# Patient Record
Sex: Male | Born: 1940 | Race: White | Hispanic: No | Marital: Married | Smoking: Former smoker
Health system: Southern US, Community
[De-identification: ages and names within clinical notes are randomized; demographics above are authoritative.]

## PROBLEM LIST (undated history)

## (undated) DIAGNOSIS — K649 Unspecified hemorrhoids: Secondary | ICD-10-CM

## (undated) DIAGNOSIS — I739 Peripheral vascular disease, unspecified: Secondary | ICD-10-CM

## (undated) DIAGNOSIS — I96 Gangrene, not elsewhere classified: Secondary | ICD-10-CM

## (undated) DIAGNOSIS — E119 Type 2 diabetes mellitus without complications: Secondary | ICD-10-CM

## (undated) DIAGNOSIS — T8130XA Disruption of wound, unspecified, initial encounter: Secondary | ICD-10-CM

## (undated) DIAGNOSIS — Z992 Dependence on renal dialysis: Secondary | ICD-10-CM

## (undated) DIAGNOSIS — I639 Cerebral infarction, unspecified: Secondary | ICD-10-CM

## (undated) DIAGNOSIS — K219 Gastro-esophageal reflux disease without esophagitis: Secondary | ICD-10-CM

## (undated) DIAGNOSIS — T8859XA Other complications of anesthesia, initial encounter: Secondary | ICD-10-CM

## (undated) DIAGNOSIS — J449 Chronic obstructive pulmonary disease, unspecified: Secondary | ICD-10-CM

## (undated) DIAGNOSIS — I499 Cardiac arrhythmia, unspecified: Secondary | ICD-10-CM

## (undated) DIAGNOSIS — Z9889 Other specified postprocedural states: Secondary | ICD-10-CM

## (undated) DIAGNOSIS — Z95810 Presence of automatic (implantable) cardiac defibrillator: Secondary | ICD-10-CM

## (undated) DIAGNOSIS — I2510 Atherosclerotic heart disease of native coronary artery without angina pectoris: Secondary | ICD-10-CM

## (undated) DIAGNOSIS — R112 Nausea with vomiting, unspecified: Secondary | ICD-10-CM

## (undated) DIAGNOSIS — R195 Other fecal abnormalities: Secondary | ICD-10-CM

## (undated) DIAGNOSIS — M3500 Sicca syndrome, unspecified: Secondary | ICD-10-CM

## (undated) DIAGNOSIS — G4730 Sleep apnea, unspecified: Secondary | ICD-10-CM

## (undated) DIAGNOSIS — I10 Essential (primary) hypertension: Secondary | ICD-10-CM

## (undated) DIAGNOSIS — I509 Heart failure, unspecified: Secondary | ICD-10-CM

## (undated) DIAGNOSIS — D649 Anemia, unspecified: Secondary | ICD-10-CM

## (undated) DIAGNOSIS — T4145XA Adverse effect of unspecified anesthetic, initial encounter: Secondary | ICD-10-CM

## (undated) DIAGNOSIS — Z8719 Personal history of other diseases of the digestive system: Secondary | ICD-10-CM

## (undated) DIAGNOSIS — N186 End stage renal disease: Secondary | ICD-10-CM

## (undated) DIAGNOSIS — M1990 Unspecified osteoarthritis, unspecified site: Secondary | ICD-10-CM

## (undated) DIAGNOSIS — L089 Local infection of the skin and subcutaneous tissue, unspecified: Secondary | ICD-10-CM

## (undated) DIAGNOSIS — R0600 Dyspnea, unspecified: Secondary | ICD-10-CM

## (undated) DIAGNOSIS — Z89432 Acquired absence of left foot: Secondary | ICD-10-CM

## (undated) HISTORY — DX: Peripheral vascular disease, unspecified: I73.9

## (undated) HISTORY — DX: Local infection of the skin and subcutaneous tissue, unspecified: L08.9

## (undated) HISTORY — PX: COLONOSCOPY W/ BIOPSIES AND POLYPECTOMY: SHX1376

## (undated) HISTORY — PX: SIGMOIDECTOMY: SHX176

## (undated) HISTORY — DX: Acquired absence of left foot: Z89.432

## (undated) HISTORY — DX: Gangrene, not elsewhere classified: I96

---

## 2009-04-11 HISTORY — PX: CORONARY ARTERY BYPASS GRAFT: SHX141

## 2014-04-11 DIAGNOSIS — I639 Cerebral infarction, unspecified: Secondary | ICD-10-CM

## 2014-04-11 HISTORY — DX: Cerebral infarction, unspecified: I63.9

## 2015-01-16 ENCOUNTER — Ambulatory Visit: Payer: Self-pay | Admitting: "Endocrinology

## 2015-04-12 HISTORY — PX: OTHER SURGICAL HISTORY: SHX169

## 2015-09-21 ENCOUNTER — Emergency Department (HOSPITAL_COMMUNITY): Payer: Medicare Other

## 2015-09-21 ENCOUNTER — Inpatient Hospital Stay (HOSPITAL_COMMUNITY)
Admission: EM | Admit: 2015-09-21 | Discharge: 2015-09-26 | DRG: 871 | Disposition: A | Discharge status: Home / Self Care | Payer: Medicare Other | Attending: Internal Medicine | Admitting: Internal Medicine

## 2015-09-21 ENCOUNTER — Encounter (HOSPITAL_COMMUNITY): Payer: Self-pay | Admitting: Emergency Medicine

## 2015-09-21 DIAGNOSIS — E1122 Type 2 diabetes mellitus with diabetic chronic kidney disease: Secondary | ICD-10-CM | POA: Diagnosis present

## 2015-09-21 DIAGNOSIS — R140 Abdominal distension (gaseous): Secondary | ICD-10-CM

## 2015-09-21 DIAGNOSIS — E1100 Type 2 diabetes mellitus with hyperosmolarity without nonketotic hyperglycemic-hyperosmolar coma (NKHHC): Secondary | ICD-10-CM | POA: Diagnosis present

## 2015-09-21 DIAGNOSIS — M109 Gout, unspecified: Secondary | ICD-10-CM | POA: Diagnosis present

## 2015-09-21 DIAGNOSIS — Z992 Dependence on renal dialysis: Secondary | ICD-10-CM | POA: Diagnosis not present

## 2015-09-21 DIAGNOSIS — E871 Hypo-osmolality and hyponatremia: Secondary | ICD-10-CM | POA: Diagnosis not present

## 2015-09-21 DIAGNOSIS — R0602 Shortness of breath: Secondary | ICD-10-CM | POA: Diagnosis present

## 2015-09-21 DIAGNOSIS — M7989 Other specified soft tissue disorders: Secondary | ICD-10-CM

## 2015-09-21 DIAGNOSIS — Z955 Presence of coronary angioplasty implant and graft: Secondary | ICD-10-CM

## 2015-09-21 DIAGNOSIS — Z881 Allergy status to other antibiotic agents status: Secondary | ICD-10-CM

## 2015-09-21 DIAGNOSIS — Z7902 Long term (current) use of antithrombotics/antiplatelets: Secondary | ICD-10-CM | POA: Diagnosis not present

## 2015-09-21 DIAGNOSIS — Z87891 Personal history of nicotine dependence: Secondary | ICD-10-CM | POA: Diagnosis not present

## 2015-09-21 DIAGNOSIS — K567 Ileus, unspecified: Secondary | ICD-10-CM | POA: Diagnosis present

## 2015-09-21 DIAGNOSIS — I214 Non-ST elevation (NSTEMI) myocardial infarction: Secondary | ICD-10-CM | POA: Diagnosis present

## 2015-09-21 DIAGNOSIS — Z8673 Personal history of transient ischemic attack (TIA), and cerebral infarction without residual deficits: Secondary | ICD-10-CM | POA: Diagnosis not present

## 2015-09-21 DIAGNOSIS — R7989 Other specified abnormal findings of blood chemistry: Secondary | ICD-10-CM | POA: Diagnosis present

## 2015-09-21 DIAGNOSIS — N186 End stage renal disease: Secondary | ICD-10-CM

## 2015-09-21 DIAGNOSIS — E0865 Diabetes mellitus due to underlying condition with hyperglycemia: Secondary | ICD-10-CM | POA: Diagnosis not present

## 2015-09-21 DIAGNOSIS — I120 Hypertensive chronic kidney disease with stage 5 chronic kidney disease or end stage renal disease: Secondary | ICD-10-CM | POA: Diagnosis present

## 2015-09-21 DIAGNOSIS — M3500 Sicca syndrome, unspecified: Secondary | ICD-10-CM | POA: Diagnosis present

## 2015-09-21 DIAGNOSIS — Z951 Presence of aortocoronary bypass graft: Secondary | ICD-10-CM | POA: Diagnosis not present

## 2015-09-21 DIAGNOSIS — T8571XA Infection and inflammatory reaction due to peritoneal dialysis catheter, initial encounter: Secondary | ICD-10-CM

## 2015-09-21 DIAGNOSIS — Z7982 Long term (current) use of aspirin: Secondary | ICD-10-CM | POA: Diagnosis not present

## 2015-09-21 DIAGNOSIS — M130 Polyarthritis, unspecified: Secondary | ICD-10-CM | POA: Diagnosis present

## 2015-09-21 DIAGNOSIS — R6510 Systemic inflammatory response syndrome (SIRS) of non-infectious origin without acute organ dysfunction: Principal | ICD-10-CM | POA: Diagnosis present

## 2015-09-21 DIAGNOSIS — N2581 Secondary hyperparathyroidism of renal origin: Secondary | ICD-10-CM | POA: Diagnosis present

## 2015-09-21 DIAGNOSIS — I25700 Atherosclerosis of coronary artery bypass graft(s), unspecified, with unstable angina pectoris: Secondary | ICD-10-CM | POA: Diagnosis not present

## 2015-09-21 DIAGNOSIS — J9601 Acute respiratory failure with hypoxia: Secondary | ICD-10-CM | POA: Diagnosis not present

## 2015-09-21 DIAGNOSIS — J449 Chronic obstructive pulmonary disease, unspecified: Secondary | ICD-10-CM | POA: Diagnosis present

## 2015-09-21 DIAGNOSIS — I639 Cerebral infarction, unspecified: Secondary | ICD-10-CM | POA: Diagnosis present

## 2015-09-21 DIAGNOSIS — E1165 Type 2 diabetes mellitus with hyperglycemia: Secondary | ICD-10-CM

## 2015-09-21 DIAGNOSIS — M1990 Unspecified osteoarthritis, unspecified site: Secondary | ICD-10-CM

## 2015-09-21 DIAGNOSIS — I25810 Atherosclerosis of coronary artery bypass graft(s) without angina pectoris: Secondary | ICD-10-CM | POA: Diagnosis present

## 2015-09-21 DIAGNOSIS — E1101 Type 2 diabetes mellitus with hyperosmolarity with coma: Secondary | ICD-10-CM | POA: Diagnosis not present

## 2015-09-21 DIAGNOSIS — Z794 Long term (current) use of insulin: Secondary | ICD-10-CM | POA: Diagnosis not present

## 2015-09-21 DIAGNOSIS — A419 Sepsis, unspecified organism: Secondary | ICD-10-CM | POA: Diagnosis present

## 2015-09-21 DIAGNOSIS — I2510 Atherosclerotic heart disease of native coronary artery without angina pectoris: Secondary | ICD-10-CM | POA: Diagnosis present

## 2015-09-21 DIAGNOSIS — R778 Other specified abnormalities of plasma proteins: Secondary | ICD-10-CM | POA: Diagnosis present

## 2015-09-21 HISTORY — DX: Atherosclerotic heart disease of native coronary artery without angina pectoris: I25.10

## 2015-09-21 HISTORY — DX: Essential (primary) hypertension: I10

## 2015-09-21 HISTORY — DX: Cerebral infarction, unspecified: I63.9

## 2015-09-21 HISTORY — DX: End stage renal disease: N18.6

## 2015-09-21 HISTORY — DX: Type 2 diabetes mellitus without complications: E11.9

## 2015-09-21 HISTORY — DX: Chronic obstructive pulmonary disease, unspecified: J44.9

## 2015-09-21 HISTORY — DX: Sjogren syndrome, unspecified: M35.00

## 2015-09-21 HISTORY — DX: Personal history of other diseases of the digestive system: Z87.19

## 2015-09-21 HISTORY — DX: Heart failure, unspecified: I50.9

## 2015-09-21 HISTORY — DX: End stage renal disease: Z99.2

## 2015-09-21 LAB — PHOSPHORUS: PHOSPHORUS: 9.1 mg/dL — AB (ref 2.5–4.6)

## 2015-09-21 LAB — URINE MICROSCOPIC-ADD ON

## 2015-09-21 LAB — CALCIUM: CALCIUM: 7.5 mg/dL — AB (ref 8.9–10.3)

## 2015-09-21 LAB — CBC
HCT: 30.4 % — ABNORMAL LOW (ref 39.0–52.0)
HEMOGLOBIN: 10.7 g/dL — AB (ref 13.0–17.0)
MCH: 31.6 pg (ref 26.0–34.0)
MCHC: 35.2 g/dL (ref 30.0–36.0)
MCV: 89.7 fL (ref 78.0–100.0)
Platelets: 223 10*3/uL (ref 150–400)
RBC: 3.39 MIL/uL — ABNORMAL LOW (ref 4.22–5.81)
RDW: 13 % (ref 11.5–15.5)
WBC: 19.1 10*3/uL — ABNORMAL HIGH (ref 4.0–10.5)

## 2015-09-21 LAB — MAGNESIUM: MAGNESIUM: 1.4 mg/dL — AB (ref 1.7–2.4)

## 2015-09-21 LAB — URINALYSIS, ROUTINE W REFLEX MICROSCOPIC
Bilirubin Urine: NEGATIVE
GLUCOSE, UA: 250 mg/dL — AB
Ketones, ur: NEGATIVE mg/dL
Leukocytes, UA: NEGATIVE
Nitrite: NEGATIVE
Protein, ur: >300 mg/dL — AB
SPECIFIC GRAVITY, URINE: 1.02 (ref 1.005–1.030)
pH: 5.5 (ref 5.0–8.0)

## 2015-09-21 LAB — COMPREHENSIVE METABOLIC PANEL
ALBUMIN: 2.7 g/dL — AB (ref 3.5–5.0)
ALK PHOS: 69 U/L (ref 38–126)
ALT: 19 U/L (ref 17–63)
AST: 22 U/L (ref 15–41)
Anion gap: 13 (ref 5–15)
BILIRUBIN TOTAL: 0.5 mg/dL (ref 0.3–1.2)
BUN: 54 mg/dL — AB (ref 6–20)
CALCIUM: 7.9 mg/dL — AB (ref 8.9–10.3)
CO2: 24 mmol/L (ref 22–32)
Chloride: 95 mmol/L — ABNORMAL LOW (ref 101–111)
Creatinine, Ser: 8.86 mg/dL — ABNORMAL HIGH (ref 0.61–1.24)
GFR calc Af Amer: 6 mL/min — ABNORMAL LOW (ref >60)
GFR calc non Af Amer: 5 mL/min — ABNORMAL LOW (ref >60)
GLUCOSE: 159 mg/dL — AB (ref 65–99)
Potassium: 3.4 mmol/L — ABNORMAL LOW (ref 3.5–5.1)
Sodium: 132 mmol/L — ABNORMAL LOW (ref 135–145)
TOTAL PROTEIN: 6.7 g/dL (ref 6.5–8.1)

## 2015-09-21 LAB — CBC WITH DIFFERENTIAL/PLATELET
BASOS ABS: 0 10*3/uL (ref 0.0–0.1)
BASOS PCT: 0 %
Eosinophils Absolute: 0 10*3/uL (ref 0.0–0.7)
Eosinophils Relative: 0 %
HEMATOCRIT: 31.2 % — AB (ref 39.0–52.0)
HEMOGLOBIN: 11 g/dL — AB (ref 13.0–17.0)
Lymphocytes Relative: 6 %
Lymphs Abs: 1 10*3/uL (ref 0.7–4.0)
MCH: 31.4 pg (ref 26.0–34.0)
MCHC: 35.3 g/dL (ref 30.0–36.0)
MCV: 89.1 fL (ref 78.0–100.0)
MONOS PCT: 13 %
Monocytes Absolute: 2.1 10*3/uL — ABNORMAL HIGH (ref 0.1–1.0)
NEUTROS ABS: 13.6 10*3/uL — AB (ref 1.7–7.7)
NEUTROS PCT: 81 %
Platelets: 247 10*3/uL (ref 150–400)
RBC: 3.5 MIL/uL — ABNORMAL LOW (ref 4.22–5.81)
RDW: 12.9 % (ref 11.5–15.5)
WBC: 16.7 10*3/uL — ABNORMAL HIGH (ref 4.0–10.5)

## 2015-09-21 LAB — LACTIC ACID, PLASMA
Lactic Acid, Venous: 1 mmol/L (ref 0.5–2.0)
Lactic Acid, Venous: 1.1 mmol/L (ref 0.5–2.0)

## 2015-09-21 LAB — I-STAT TROPONIN, ED: Troponin i, poc: 2.08 ng/mL (ref 0.00–0.08)

## 2015-09-21 LAB — CREATININE, SERUM
CREATININE: 9.58 mg/dL — AB (ref 0.61–1.24)
GFR calc Af Amer: 5 mL/min — ABNORMAL LOW (ref >60)
GFR calc non Af Amer: 5 mL/min — ABNORMAL LOW (ref >60)

## 2015-09-21 LAB — BODY FLUID CELL COUNT WITH DIFFERENTIAL: WBC FLUID: 0 uL (ref 0–1000)

## 2015-09-21 LAB — GLUCOSE, CAPILLARY: GLUCOSE-CAPILLARY: 90 mg/dL (ref 65–99)

## 2015-09-21 LAB — PROTIME-INR
INR: 1.22 (ref 0.00–1.49)
PROTHROMBIN TIME: 15.5 s — AB (ref 11.6–15.2)

## 2015-09-21 LAB — TSH: TSH: 3.758 u[IU]/mL (ref 0.350–4.500)

## 2015-09-21 LAB — BRAIN NATRIURETIC PEPTIDE: B Natriuretic Peptide: 821 pg/mL — ABNORMAL HIGH (ref 0.0–100.0)

## 2015-09-21 LAB — URIC ACID: URIC ACID, SERUM: 9 mg/dL — AB (ref 4.4–7.6)

## 2015-09-21 MED ORDER — ASPIRIN EC 81 MG PO TBEC
81.0000 mg | DELAYED_RELEASE_TABLET | Freq: Every day | ORAL | Status: DC
  Administered 2015-09-22 – 2015-09-26 (×5): 81 mg via ORAL
  Filled 2015-09-21 (×5): qty 1

## 2015-09-21 MED ORDER — MORPHINE SULFATE (PF) 4 MG/ML IV SOLN
4.0000 mg | Freq: Once | INTRAVENOUS | Status: AC
  Administered 2015-09-21: 4 mg via INTRAVENOUS
  Filled 2015-09-21: qty 1

## 2015-09-21 MED ORDER — INSULIN ASPART 100 UNIT/ML ~~LOC~~ SOLN
0.0000 [IU] | Freq: Every day | SUBCUTANEOUS | Status: DC
  Administered 2015-09-23 – 2015-09-24 (×2): 4 [IU] via SUBCUTANEOUS

## 2015-09-21 MED ORDER — ASPIRIN 81 MG PO CHEW
324.0000 mg | CHEWABLE_TABLET | Freq: Once | ORAL | Status: AC
  Administered 2015-09-21: 324 mg via ORAL
  Filled 2015-09-21: qty 4

## 2015-09-21 MED ORDER — TRAMADOL HCL 50 MG PO TABS
50.0000 mg | ORAL_TABLET | Freq: Four times a day (QID) | ORAL | Status: DC | PRN
  Administered 2015-09-21 – 2015-09-22 (×2): 50 mg via ORAL
  Filled 2015-09-21 (×2): qty 1

## 2015-09-21 MED ORDER — CARVEDILOL 12.5 MG PO TABS
12.5000 mg | ORAL_TABLET | Freq: Two times a day (BID) | ORAL | Status: DC
  Administered 2015-09-21 – 2015-09-26 (×10): 12.5 mg via ORAL
  Filled 2015-09-21 (×10): qty 1

## 2015-09-21 MED ORDER — SEVELAMER CARBONATE 800 MG PO TABS
800.0000 mg | ORAL_TABLET | Freq: Three times a day (TID) | ORAL | Status: DC
  Administered 2015-09-22 – 2015-09-26 (×12): 800 mg via ORAL
  Filled 2015-09-21 (×13): qty 1

## 2015-09-21 MED ORDER — DOCUSATE SODIUM 100 MG PO CAPS
100.0000 mg | ORAL_CAPSULE | Freq: Every day | ORAL | Status: DC
  Administered 2015-09-21 – 2015-09-26 (×6): 100 mg via ORAL
  Filled 2015-09-21 (×6): qty 1

## 2015-09-21 MED ORDER — INSULIN ASPART 100 UNIT/ML ~~LOC~~ SOLN
0.0000 [IU] | Freq: Three times a day (TID) | SUBCUTANEOUS | Status: DC
  Administered 2015-09-22: 9 [IU] via SUBCUTANEOUS
  Administered 2015-09-22: 2 [IU] via SUBCUTANEOUS
  Administered 2015-09-22: 1 [IU] via SUBCUTANEOUS

## 2015-09-21 MED ORDER — INSULIN GLARGINE 100 UNIT/ML ~~LOC~~ SOLN
20.0000 [IU] | Freq: Every day | SUBCUTANEOUS | Status: DC
  Administered 2015-09-22: 20 [IU] via SUBCUTANEOUS
  Filled 2015-09-21 (×3): qty 0.2

## 2015-09-21 MED ORDER — ONDANSETRON HCL 4 MG/2ML IJ SOLN
4.0000 mg | Freq: Once | INTRAMUSCULAR | Status: AC
  Administered 2015-09-21: 4 mg via INTRAVENOUS
  Filled 2015-09-21: qty 2

## 2015-09-21 MED ORDER — VITAMIN D (ERGOCALCIFEROL) 1.25 MG (50000 UNIT) PO CAPS
50000.0000 [IU] | ORAL_CAPSULE | ORAL | Status: DC
  Administered 2015-09-22: 50000 [IU] via ORAL
  Filled 2015-09-21 (×2): qty 1

## 2015-09-21 MED ORDER — PIPERACILLIN-TAZOBACTAM 3.375 G IVPB 30 MIN
3.3750 g | Freq: Once | INTRAVENOUS | Status: AC
  Administered 2015-09-21: 3.375 g via INTRAVENOUS
  Filled 2015-09-21: qty 50

## 2015-09-21 MED ORDER — PIPERACILLIN-TAZOBACTAM IN DEX 2-0.25 GM/50ML IV SOLN
2.2500 g | Freq: Three times a day (TID) | INTRAVENOUS | Status: DC
  Administered 2015-09-21 – 2015-09-23 (×6): 2.25 g via INTRAVENOUS
  Filled 2015-09-21 (×10): qty 50

## 2015-09-21 MED ORDER — HEPARIN SODIUM (PORCINE) 5000 UNIT/ML IJ SOLN
5000.0000 [IU] | Freq: Three times a day (TID) | INTRAMUSCULAR | Status: DC
  Administered 2015-09-21 – 2015-09-26 (×14): 5000 [IU] via SUBCUTANEOUS
  Filled 2015-09-21 (×9): qty 1

## 2015-09-21 MED ORDER — ONDANSETRON HCL 4 MG PO TABS: 4.0000 mg | ORAL_TABLET | Freq: Four times a day (QID) | ORAL | Status: DC | PRN

## 2015-09-21 MED ORDER — SODIUM CHLORIDE 0.9 % IV SOLN
1000.0000 mL | INTRAVENOUS | Status: DC
  Administered 2015-09-21 – 2015-09-23 (×5): 1000 mL via INTRAVENOUS
  Filled 2015-09-21: qty 1000

## 2015-09-21 MED ORDER — TICAGRELOR 90 MG PO TABS
90.0000 mg | ORAL_TABLET | Freq: Two times a day (BID) | ORAL | Status: DC
  Administered 2015-09-21 – 2015-09-26 (×10): 90 mg via ORAL
  Filled 2015-09-21 (×13): qty 1

## 2015-09-21 MED ORDER — ATORVASTATIN CALCIUM 80 MG PO TABS
80.0000 mg | ORAL_TABLET | Freq: Every day | ORAL | Status: DC
  Administered 2015-09-21 – 2015-09-25 (×5): 80 mg via ORAL
  Filled 2015-09-21: qty 2
  Filled 2015-09-21 (×5): qty 1

## 2015-09-21 MED ORDER — PIPERACILLIN-TAZOBACTAM IN DEX 2-0.25 GM/50ML IV SOLN
INTRAVENOUS | Status: AC
  Filled 2015-09-21: qty 50

## 2015-09-21 MED ORDER — SODIUM CHLORIDE 0.9% FLUSH
3.0000 mL | Freq: Two times a day (BID) | INTRAVENOUS | Status: DC
  Administered 2015-09-22 – 2015-09-26 (×6): 3 mL via INTRAVENOUS

## 2015-09-21 MED ORDER — ISOSORBIDE MONONITRATE ER 60 MG PO TB24
120.0000 mg | ORAL_TABLET | Freq: Every day | ORAL | Status: DC
Start: 2015-09-21 — End: 2015-09-26
  Administered 2015-09-21 – 2015-09-26 (×6): 120 mg via ORAL
  Filled 2015-09-21 (×6): qty 2

## 2015-09-21 MED ORDER — ACETAMINOPHEN 500 MG PO TABS
1000.0000 mg | ORAL_TABLET | Freq: Once | ORAL | Status: AC
  Administered 2015-09-21: 1000 mg via ORAL
  Filled 2015-09-21: qty 2

## 2015-09-21 MED ORDER — TICAGRELOR 90 MG PO TABS
ORAL_TABLET | ORAL | Status: AC
  Filled 2015-09-21: qty 1

## 2015-09-21 MED ORDER — ONDANSETRON HCL 4 MG/2ML IJ SOLN
4.0000 mg | Freq: Four times a day (QID) | INTRAMUSCULAR | Status: DC | PRN
  Administered 2015-09-22 (×2): 4 mg via INTRAVENOUS
  Filled 2015-09-21 (×2): qty 2

## 2015-09-21 NOTE — ED Notes (Addendum)
Pt reports had a heart catherization through right groin with stent placement on friday. Pt reports generalized swelling, generalized pain worse in BLE. Pt reports [facility]. Pt reports brace was placed on RLE, ultrasound performed with negative result, and reports kidney doctor," told patient not to go back to [place] but to come to [facility] for further evaluation." pt reports intermittent sob. Pt reports dialysis every night at home. Pt reports has not voided or had BM since Saturday.

## 2015-09-21 NOTE — ED Notes (Signed)
PA at bedside.

## 2015-09-21 NOTE — Progress Notes (Addendum)
Pharmacy Note:  Initial antibiotics for Zosyn ordered by EDP for Zosyn.  Estimated Creatinine Clearance: 8.2 mL/min (by C-G formula based on Cr of 8.86).   Allergies  Allergen Reactions  . Lisinopril     hives  . Methotrexate Derivatives     blisters  . Sulfa Antibiotics     hives  . Tape     Hives   . Vancomycin     Blisters     Filed Vitals:   09/21/15 1134 09/21/15 1307  BP: 136/69   Pulse: 99   Temp:  101 F (38.3 C)  Resp:      Anti-infectives    Start     Dose/Rate Route Frequency Ordered Stop   09/21/15 1330  piperacillin-tazobactam (ZOSYN) IVPB 3.375 g     3.375 g 100 mL/hr over 30 Minutes Intravenous  Once 09/21/15 1325       Plan: Initial doses of Zosyn 3.375gm X 1 ordered. F/U admission orders for further dosing if therapy continued.  Mady GemmaHayes, Mariza Bourget R, [facility] 09/21/2015 1:38 PM  Zosyn to continue on admission. ESRD dosing. Zosyn 2.25gm IV every 8 hours. Monitor labs, micro and vitals.  Mady GemmaHayes, Anav Lammert R, [facility] 09/21/2015 8:36 PM

## 2015-09-21 NOTE — H&P (Signed)
[facility] History and Physical  [name] ZOX:[id number] DOB: Feb 05, 1941    PCP:   Pcp Not In System   Chief Complaint: joint aches, fever, and malaise.   HPI: [name] is an 75 y.o. male with hx of DM, ESRD on PD, COPD, prior CVA, CAD s/p CABG, recent STEMI resulting in a cardiac cath a few days ago, with stent placement, elevated troponin being trended down, on DUAT with ASA and Brilinta, hx of CHF, presented to the ER with swelling, diffuse joint pain, and subjective fever.  He had no rash, abdominal pain, coughs or SOB.  In the past, he had joint aches, and frequently he has pain in the MTP joint.  The right groin where he had his Cath last Friday was not painful.  He does PD at home with the help of his wife who is at his bedside.  Evaluation in the ER showed WBC of 16K, Cr of 8.8 with K of 3.4.  His CXR did not show any infiltrate.  LFTs were normal and UA was negative.  He was started on IV Van/Zosyn for sepsis, and hospitalist was asked to admit him for further Tx.  He was seen mostly and recently at [facility], and he did not want to go back there.  His Lactic acid was negative, and he has hemodynamic stability.   Rewiew of Systems:  Constitutional: Negative for malaise, fever and chills. No significant weight loss or weight gain Eyes: Negative for eye pain, redness and discharge, diplopia, visual changes, or flashes of light. ENMT: Negative for ear pain, hoarseness, nasal congestion, sinus pressure and sore throat. No headaches; tinnitus, drooling, or problem swallowing. Cardiovascular: Negative for chest pain, palpitations, diaphoresis, dyspnea and peripheral edema. ; No orthopnea, PND Respiratory: Negative for cough, hemoptysis, wheezing and stridor. No pleuritic chestpain. Gastrointestinal: Negative for nausea, vomiting, diarrhea, constipation, abdominal pain, melena, blood in stool, hematemesis, jaundice and rectal bleeding.    Genitourinary: Negative for  frequency, dysuria, incontinence,flank pain and hematuria; Musculoskeletal: Negative for back pain and neck pain. Negative for swelling and trauma.;  Skin: . Negative for pruritus, rash, abrasions, bruising and skin lesion.; ulcerations Neuro: Negative for headache, lightheadedness and neck stiffness. Negative for weakness, altered level of consciousness , altered mental status, extremity weakness, burning feet, involuntary movement, seizure and syncope.  Psych: negative for anxiety, depression, insomnia, tearfulness, panic attacks, hallucinations, paranoia, suicidal or homicidal ideation   Past Medical History  Diagnosis Date  . Hypertension   . Diabetes mellitus without complication (HCC)   . Coronary artery disease   . COPD (chronic obstructive pulmonary disease) (HCC)   . Stroke (HCC)   . Renal disorder   . CHF (congestive heart failure) [facility])     Past Surgical History  Procedure Laterality Date  . Coronary artery bypass graft  2011  . Cardiac catherization with stent placement  2017    Medications:  HOME MEDS: Prior to Admission medications   Medication Sig Start Date End Date Taking? Authorizing Provider  aspirin EC 81 MG tablet Take 81 mg by mouth daily.   Yes Historical Provider, MD  atorvastatin (LIPITOR) 80 MG tablet Take 80 mg by mouth daily at 6 PM.   Yes Historical Provider, MD  carvedilol (COREG) 12.5 MG tablet Take 12.5 mg by mouth 2 (two) times daily with a meal.   Yes Historical Provider, MD  docusate sodium (COLACE) 100 MG capsule Take 100 mg by mouth daily.   Yes Historical Provider, MD  furosemide (LASIX) 40 MG tablet Take 40 mg by mouth 2 (two) times daily.   Yes Historical Provider, MD  insulin NPH-regular Human (NOVOLIN 70/30) (70-30) 100 UNIT/ML injection Inject 50 Units into the skin daily with breakfast. And 50 units in the evening.   Yes Historical Provider, MD  isosorbide mononitrate (IMDUR) 120 MG 24 hr tablet Take 120 mg by mouth daily.   Yes  Historical Provider, MD  sevelamer carbonate (RENVELA) 800 MG tablet Take 800 mg by mouth 3 (three) times daily with meals.   Yes Historical Provider, MD  ticagrelor (BRILINTA) 90 MG TABS tablet Take 90 mg by mouth 2 (two) times daily.   Yes Historical Provider, MD  Vitamin D, Ergocalciferol, (DRISDOL) 50000 units CAPS capsule Take 50,000 Units by mouth every 7 (seven) days.   Yes Historical Provider, MD     Allergies:  Allergies  Allergen Reactions  . Lisinopril     hives  . Methotrexate Derivatives     blisters  . Sulfa Antibiotics     hives  . Tape     Hives   . Vancomycin     Blisters     Social History:   reports that he has quit smoking. He does not have any smokeless tobacco history on file. He reports that he does not drink alcohol or use illicit drugs.  Family History: History reviewed. No pertinent family history.   Physical Exam: Filed Vitals:   09/21/15 1134 09/21/15 1137 09/21/15 1307 09/21/15 1457  BP: 136/69   134/78  Pulse: 99   90  Temp:   101 F (38.3 C)   TempSrc:   Rectal   Resp:    18  Height:      Weight:      SpO2: 72% 100%  100%   Blood pressure 134/78, pulse 90, temperature 101 F (38.3 C), temperature source Rectal, resp. rate 18, height  (1.753 m), weight 91.173 kg (201 lb), SpO2 100 %.  GEN:  Pleasant  patient lying in the stretcher in no acute distress; cooperative with exam. PSYCH:  alert and oriented x4; does not appear anxious or depressed; affect is appropriate. HEENT: Mucous membranes pink and anicteric; PERRLA; EOM intact; no cervical lymphadenopathy nor thyromegaly or carotid bruit; no JVD; There were no stridor. Neck is very supple. Breasts:: Not examined CHEST WALL: No tenderness CHEST: Normal respiration, clear to auscultation bilaterally.  HEART: Regular rate and rhythm.  There are no murmur, rub, or gallops.   BACK: No kyphosis or scoliosis; no CVA tenderness ABDOMEN: soft and non-tender; no masses, no organomegaly,  normal abdominal bowel sounds; no pannus; no intertriginous candida. There is no rebound and no distention. Rectal Exam: Not done EXTREMITIES: No bone or joint deformity; age-appropriate arthropathy of the hands and knees; no edema; no ulcerations.  There is no calf tenderness. Genitalia: not examined PULSES: 2+ and symmetric SKIN: Normal hydration no rash or ulceration CNS: Cranial nerves 2-12 grossly intact no focal lateralizing neurologic deficit.  Speech is fluent; uvula elevated with phonation, facial symmetry and tongue midline. DTR are normal bilaterally, cerebella exam is intact, barbinski is negative and strengths are equaled bilaterally.  No sensory loss.   Labs on Admission:  Basic Metabolic Panel:  Recent Labs Lab 09/21/15 1100  NA 132*  K 3.4*  CL 95*  CO2 24  GLUCOSE 159*  BUN 54*  CREATININE 8.86*  CALCIUM 7.9*   Liver Function Tests:  Recent Labs Lab 09/21/15 1100  AST 22  ALT 19  ALKPHOS 69  BILITOT 0.5  PROT 6.7  ALBUMIN 2.7*   No results for input(s): LIPASE, AMYLASE in the last 168 hours. No results for input(s): AMMONIA in the last 168 hours. CBC:  Recent Labs Lab 09/21/15 1100  WBC 16.7*  NEUTROABS 13.6*  HGB 11.0*  HCT 31.2*  MCV 89.1  PLT 247   Radiological Exams on Admission: Dg Chest 2 View  09/21/2015  CLINICAL DATA:  Status post coronary artery stent placement 09/18/2015. Generalized swelling. Bilateral lower extremity pain. Initial encounter. EXAM: CHEST  2 VIEW COMPARISON:  None. FINDINGS: The patient is status post CABG. The lungs are clear. Heart size is upper normal. No pneumothorax or pleural effusion. IMPRESSION: No acute disease. Electronically Signed   By: Drusilla Kannerhomas  Dalessio M.D.   On: 09/21/2015 12:32   [place] Venous Img Upper Uni Right  09/21/2015  CLINICAL DATA:  Coronary stent placement on 09/18/2015 with right upper extremity pain edema and shortness of breath. Evaluate for DVT. History of diabetes and smoking. EXAM: RIGHT  UPPER EXTREMITY VENOUS DOPPLER ULTRASOUND TECHNIQUE: Gray-scale sonography with graded compression, as well as color Doppler and duplex ultrasound were performed to evaluate the upper extremity deep venous system from the level of the subclavian vein and including the jugular, axillary, basilic, radial, ulnar and upper cephalic vein. Spectral Doppler was utilized to evaluate flow at rest and with distal augmentation maneuvers. COMPARISON:  None. FINDINGS: Contralateral Subclavian Vein: Respiratory phasicity is normal and symmetric with the symptomatic side. No evidence of thrombus. Normal compressibility. Internal Jugular Vein: No evidence of thrombus. Normal compressibility, respiratory phasicity and response to augmentation. Subclavian Vein: No evidence of thrombus. Normal compressibility, respiratory phasicity and response to augmentation. Axillary Vein: No evidence of thrombus. Normal compressibility, respiratory phasicity and response to augmentation. Cephalic Vein: No evidence of thrombus. Normal compressibility, respiratory phasicity and response to augmentation. Basilic Vein: No evidence of thrombus. Normal compressibility, respiratory phasicity and response to augmentation. Brachial Veins: No evidence of thrombus. Normal compressibility, respiratory phasicity and response to augmentation. Radial Veins: No evidence of thrombus. Normal compressibility, respiratory phasicity and response to augmentation. Ulnar Veins: No evidence of thrombus. Normal compressibility, respiratory phasicity and response to augmentation. Venous Reflux:  None visualized. Other Findings:  None visualized. IMPRESSION: No evidence of DVT within the right upper extremity. Electronically Signed   By: Simonne ComeJohn  Watts M.D.   On: 09/21/2015 14:01   Dg Abd 2 Views  09/21/2015  CLINICAL DATA:  Decreased urine output. EXAM: ABDOMEN - 2 VIEW COMPARISON:  None. FINDINGS: Upright film shows no evidence for intraperitoneal free air. Diffuse gaseous  distention of small bowel is evident with nondilated colon containing stool and possibly oral contrast material. Peritoneal dialysis catheter seen coiled in the right lower quadrant the abdomen. Multiple wires from telemetry device overlying the abdomen. IMPRESSION: Diffuse gaseous bowel distention. Imaging features do not suggest a frank, overt small bowel obstruction, but could be compatible with ileus. Electronically Signed   By: Kennith CenterEric  Mansell M.D.   On: 09/21/2015 12:30   Assessment/Plan Present on Admission:  . Peritonitis, dialysis-associated (HCC) . Sepsis (HCC) . Elevated troponin level . CVA (cerebral infarction)  PLAN:  Possible sepsis:  I am concerned about SBP.  I have gotten some peritoneal fluid, and will send for cell count.  Will continue with IV Van/Zosyn for now.  Urine and Blood culture were obtained.  ESRD on PD:  Will consult Dr Adalberto ColeBelfakadu for assistance with his  PD.    Elevated  Troponin:  It is trending down from his recent NSTEMI.  He had a cath and a stent placed.  Will need to continue with ASA and especially Brillinta.   Hx of CVA:  Minimal residual deficit.   Arthritis:  Doubt septic joint.  Seems multiple joints.  Could be gout.  Will obtain uric acid. Give some IVF.     Other plans as per orders. Code Status: FULL Unk Lightning, MD. FACP [facility] Pager [phone] 7pm to 7am.  09/21/2015, 3:45 PM

## 2015-09-21 NOTE — ED Notes (Signed)
Pt does not want to stand for actual weight.

## 2015-09-21 NOTE — ED Provider Notes (Signed)
CSN: [id number]     Arrival date & time 09/21/15  1026 History   First MD Initiated Contact with Patient 09/21/15 1051     Chief Complaint  Patient presents with  . Joint Swelling     (Consider location/radiation/quality/duration/timing/severity/associated sxs/prior Treatment) HPI [name] is a 75 y.o. male with history of hypertension, diabetes, coronary disease, CVA, end-stage renal disease on dialysis, presents to emergency department with complaint of swelling and shortness of breath. Patient had cardiac stents placed through a right groin 3 days ago. This was performed in [place]. Patient states that he was feeling well the next day. He states yesterday that he noticed that his right hand became painful and started to swell. Later that day his leg swelled up as well. He was seen in [facility] ER where he has blood work done and venous Doppler of the right leg and was told that everything looked okay. Patient states that today he noticed that his left hand is starting to swell as well. He denies any fever or chills. He states he is also feeling short of breath. He denies any chest pain. He is currently on peritoneal dialysis, and states he has not been putting out as much fluid as he normally does since his stenting. He reports his abdomen feels swollen. Patient has also not urinated in the last 2 days which is not normal for him.  Past Medical History  Diagnosis Date  . Hypertension   . Diabetes mellitus without complication (HCC)   . Coronary artery disease   . COPD (chronic obstructive pulmonary disease) (HCC)   . Stroke (HCC)   . Renal disorder   . CHF (congestive heart failure) [facility])    Past Surgical History  Procedure Laterality Date  . Coronary artery bypass graft  2011  . Cardiac catherization with stent placement  2017   History reviewed. No pertinent family history. Social History  Substance Use Topics  . Smoking status: Former Games developer  . Smokeless  tobacco: None  . Alcohol Use: No    Review of Systems  Constitutional: Positive for fever, chills and fatigue.  Respiratory: Positive for shortness of breath. Negative for cough and chest tightness.   Cardiovascular: Positive for leg swelling. Negative for chest pain and palpitations.  Gastrointestinal: Negative for nausea, vomiting, abdominal pain, diarrhea and abdominal distention.  Genitourinary: Negative for dysuria, urgency, frequency and hematuria.  Musculoskeletal: Positive for myalgias, joint swelling and arthralgias. Negative for neck pain and neck stiffness.  Skin: Negative for rash.  Allergic/Immunologic: Negative for immunocompromised state.  Neurological: Positive for weakness. Negative for dizziness, light-headedness, numbness and headaches.  All other systems reviewed and are negative.     Allergies  Lisinopril; Methotrexate derivatives; Sulfa antibiotics; Tape; and Vancomycin  Home Medications   Prior to Admission medications   Not on File   BP 156/118 mmHg  Pulse 104  Temp(Src) 99.1 F (37.3 C) (Oral)  Resp 28  Ht 5\' 9"  (1.753 m)  Wt 91.173 kg  BMI 29.67 kg/m2  SpO2 100% Physical Exam  Constitutional: He is oriented to person, place, and time. He appears well-developed and well-nourished. No distress.  HENT:  Head: Normocephalic and atraumatic.  Eyes: Conjunctivae are normal.  Neck: Neck supple.  Cardiovascular: Normal rate, regular rhythm and normal heart sounds.   Pulmonary/Chest: Effort normal and breath sounds normal. No respiratory distress. He has no wheezes. He has no rales.  Abdominal: Soft. Bowel sounds are normal. He exhibits no distension. There is  no tenderness. There is no rebound and no guarding.  Distended. No erythema or tenderness to the palpation  Musculoskeletal: He exhibits no edema.  Significant swelling of the right hand and forearm. His hand is very tender over palm of the hand and pain with movement of third, fourth, fifth  fingers. Patient unable to extend all the fingers completely. Tenderness extends over palmar surface of the forearm. There is mild erythematous discoloration to the hand and forearm, however no warmth to touch. There is significant swelling of the right ankle and foot compared to the left ankle and foot. Dorsal pedal pulses intact bilaterally. Distal radial pulse intact. Tenderness to palpation also to the left hand and left lower leg  Neurological: He is alert and oriented to person, place, and time.  Skin: Skin is warm and dry.  A right groin incision appears normal with no obvious swelling or hematoma. Patient does have some bruising from what looks like a strap or a tourniquet was right below the insertion of the catheter point. He also has a small ulceration to the area. No surrounding cellulitis or purulent drainage.  Nursing note and vitals reviewed.   ED Course  Procedures (including critical care time) Labs Review Labs Reviewed  CBC WITH DIFFERENTIAL/PLATELET - Abnormal; Notable for the following:    WBC 16.7 (*)    RBC 3.50 (*)    Hemoglobin 11.0 (*)    HCT 31.2 (*)    Neutro Abs 13.6 (*)    Monocytes Absolute 2.1 (*)    All other components within normal limits  COMPREHENSIVE METABOLIC PANEL - Abnormal; Notable for the following:    Sodium 132 (*)    Potassium 3.4 (*)    Chloride 95 (*)    Glucose, Bld 159 (*)    BUN 54 (*)    Creatinine, Ser 8.86 (*)    Calcium 7.9 (*)    Albumin 2.7 (*)    GFR calc non Af Amer 5 (*)    GFR calc Af Amer 6 (*)    All other components within normal limits  URINALYSIS, ROUTINE W REFLEX MICROSCOPIC (NOT AT [facility]) - Abnormal; Notable for the following:    APPearance HAZY (*)    Glucose, UA 250 (*)    Hgb urine dipstick MODERATE (*)    Protein, ur >300 (*)    All other components within normal limits  PROTIME-INR - Abnormal; Notable for the following:    Prothrombin Time 15.5 (*)    All other components within normal limits  BRAIN  NATRIURETIC PEPTIDE - Abnormal; Notable for the following:    B Natriuretic Peptide 821.0 (*)    All other components within normal limits  URINE MICROSCOPIC-ADD ON - Abnormal; Notable for the following:    Squamous Epithelial / LPF 0-5 (*)    Bacteria, UA FEW (*)    Casts GRANULAR CAST (*)    All other components within normal limits  I-STAT TROPOININ, ED - Abnormal; Notable for the following:    Troponin i, poc 2.08 (*)    All other components within normal limits  CULTURE, BLOOD (ROUTINE X 2)  CULTURE, BLOOD (ROUTINE X 2)  URINE CULTURE  BODY FLUID CULTURE  LACTIC ACID, PLASMA  LACTIC ACID, PLASMA  BODY FLUID CELL COUNT WITH DIFFERENTIAL    Imaging Review No results found. I have personally reviewed and evaluated these images and lab results as part of my medical decision-making.   EKG Interpretation None      MDM  Final diagnoses:  Abdominal distension  Arm swelling   Pt with subjective fevers, swelling to RUE, RLE, new pain to left hand and leg. SOB. Cardiac stent 3 days ago through right groin, which appears normal. Pt on peritoneal Dialysis. Will check labs, EKG, chest x-ray, abdominal x-ray, urinalysis.   Troponin is elevated at 2, patient apparently had troponin greater than 42 days ago, question whether it's trending down. He does have elevated white blood cell count of 16.7. Will add lactic acid and blood cultures. Venous Doppler of the right hand is negative. We will obtain records from [name].   Patient's rectal temperature is 101. Tylenol given, will start gentle fluids. Also ordered antibiotics, Zosyn was ordered. Patient is allergic to vancomycin. I will admit the patient for further treatment. Patient is from [place] with all of his caretakers and physicians in [place], however he does not want to go back to the hospital. He is requesting been admitted here. Discussed with [facility], will admit.   Pt's 70pages of records from [place] reviewed and given to  Dr. Conley Rolls at time of the admission  Filed Vitals:   09/21/15 1134 09/21/15 1137 09/21/15 1307 09/21/15 1457  BP: 136/69   134/78  Pulse: 99   90  Temp:   101 F (38.3 C)   TempSrc:   Rectal   Resp:    18  Height:      Weight:      SpO2: 72% 100%  100%       Jaynie Crumble, PA-C 09/22/15 1632  Blane Ohara, MD 09/23/15 [phone]

## 2015-09-22 ENCOUNTER — Encounter (HOSPITAL_COMMUNITY): Payer: Self-pay | Admitting: Nephrology

## 2015-09-22 DIAGNOSIS — M109 Gout, unspecified: Secondary | ICD-10-CM

## 2015-09-22 DIAGNOSIS — Z992 Dependence on renal dialysis: Secondary | ICD-10-CM

## 2015-09-22 DIAGNOSIS — I25810 Atherosclerosis of coronary artery bypass graft(s) without angina pectoris: Secondary | ICD-10-CM | POA: Diagnosis present

## 2015-09-22 DIAGNOSIS — K567 Ileus, unspecified: Secondary | ICD-10-CM | POA: Diagnosis present

## 2015-09-22 DIAGNOSIS — I25700 Atherosclerosis of coronary artery bypass graft(s), unspecified, with unstable angina pectoris: Secondary | ICD-10-CM

## 2015-09-22 DIAGNOSIS — J9601 Acute respiratory failure with hypoxia: Secondary | ICD-10-CM | POA: Diagnosis present

## 2015-09-22 DIAGNOSIS — R7989 Other specified abnormal findings of blood chemistry: Secondary | ICD-10-CM

## 2015-09-22 DIAGNOSIS — N186 End stage renal disease: Secondary | ICD-10-CM

## 2015-09-22 LAB — COMPREHENSIVE METABOLIC PANEL
ALBUMIN: 1.9 g/dL — AB (ref 3.5–5.0)
ALK PHOS: 53 U/L (ref 38–126)
ALT: 16 U/L — AB (ref 17–63)
AST: 21 U/L (ref 15–41)
Anion gap: 15 (ref 5–15)
BUN: 61 mg/dL — ABNORMAL HIGH (ref 6–20)
CALCIUM: 7.3 mg/dL — AB (ref 8.9–10.3)
CHLORIDE: 95 mmol/L — AB (ref 101–111)
CO2: 22 mmol/L (ref 22–32)
CREATININE: 10.33 mg/dL — AB (ref 0.61–1.24)
GFR calc non Af Amer: 4 mL/min — ABNORMAL LOW (ref >60)
GFR, EST AFRICAN AMERICAN: 5 mL/min — AB (ref >60)
GLUCOSE: 141 mg/dL — AB (ref 65–99)
Potassium: 4.2 mmol/L (ref 3.5–5.1)
SODIUM: 132 mmol/L — AB (ref 135–145)
Total Bilirubin: 0.4 mg/dL (ref 0.3–1.2)
Total Protein: 5.2 g/dL — ABNORMAL LOW (ref 6.5–8.1)

## 2015-09-22 LAB — GLUCOSE, CAPILLARY
GLUCOSE-CAPILLARY: 103 mg/dL — AB (ref 65–99)
Glucose-Capillary: 148 mg/dL — ABNORMAL HIGH (ref 65–99)
Glucose-Capillary: 189 mg/dL — ABNORMAL HIGH (ref 65–99)
Glucose-Capillary: 367 mg/dL — ABNORMAL HIGH (ref 65–99)
Glucose-Capillary: 435 mg/dL — ABNORMAL HIGH (ref 65–99)

## 2015-09-22 LAB — CBC
HCT: 27.5 % — ABNORMAL LOW (ref 39.0–52.0)
HEMOGLOBIN: 9.2 g/dL — AB (ref 13.0–17.0)
MCH: 30 pg (ref 26.0–34.0)
MCHC: 33.5 g/dL (ref 30.0–36.0)
MCV: 89.6 fL (ref 78.0–100.0)
PLATELETS: 197 10*3/uL (ref 150–400)
RBC: 3.07 MIL/uL — AB (ref 4.22–5.81)
RDW: 13.3 % (ref 11.5–15.5)
WBC: 18.4 10*3/uL — AB (ref 4.0–10.5)

## 2015-09-22 LAB — URINE CULTURE

## 2015-09-22 LAB — TROPONIN I
Troponin I: 1.42 ng/mL (ref <0.031)
Troponin I: 0.97 ng/mL (ref <0.031)

## 2015-09-22 MED ORDER — PREDNISONE 10 MG PO TABS
60.0000 mg | ORAL_TABLET | Freq: Every day | ORAL | Status: DC
  Administered 2015-09-22 – 2015-09-24 (×3): 60 mg via ORAL
  Filled 2015-09-22 (×3): qty 1

## 2015-09-22 MED ORDER — DELFLEX-LC/2.5% DEXTROSE 394 MOSM/L IP SOLN
INTRAPERITONEAL | Status: DC
  Administered 2015-09-22: 1500 mL via INTRAPERITONEAL
  Administered 2015-09-22: 3000 mL via INTRAPERITONEAL

## 2015-09-22 MED ORDER — HYDROMORPHONE HCL 1 MG/ML IJ SOLN
INTRAMUSCULAR | Status: AC
  Administered 2015-09-22: 0.5 mg via INTRAVENOUS
  Filled 2015-09-22: qty 1

## 2015-09-22 MED ORDER — DELFLEX-LC/2.5% DEXTROSE 394 MOSM/L IP SOLN
INTRAPERITONEAL | Status: DC
  Administered 2015-09-23: 10000 mL via INTRAPERITONEAL

## 2015-09-22 MED ORDER — INSULIN ASPART 100 UNIT/ML ~~LOC~~ SOLN
10.0000 [IU] | Freq: Once | SUBCUTANEOUS | Status: AC
  Administered 2015-09-22: 10 [IU] via SUBCUTANEOUS

## 2015-09-22 MED ORDER — HEPARIN 1000 UNIT/ML FOR PERITONEAL DIALYSIS
2500.0000 [IU] | INTRAMUSCULAR | Status: DC | PRN
  Administered 2015-09-22 – 2015-09-24 (×4): 2500 [IU] via INTRAPERITONEAL
  Filled 2015-09-22 (×14): qty 2.5

## 2015-09-22 MED ORDER — HEPARIN 1000 UNIT/ML FOR PERITONEAL DIALYSIS
1500.0000 [IU] | INTRAMUSCULAR | Status: DC | PRN
  Administered 2015-09-22: 1500 [IU] via INTRAPERITONEAL
  Filled 2015-09-22 (×2): qty 1.5

## 2015-09-22 MED ORDER — HYDROMORPHONE HCL 1 MG/ML IJ SOLN
0.5000 mg | INTRAMUSCULAR | Status: AC
  Administered 2015-09-22: 0.5 mg via INTRAVENOUS

## 2015-09-22 MED ORDER — COLCHICINE 0.6 MG PO TABS
0.6000 mg | ORAL_TABLET | Freq: Once | ORAL | Status: AC
  Administered 2015-09-22: 0.6 mg via ORAL
  Filled 2015-09-22: qty 1

## 2015-09-22 MED ORDER — GENTAMICIN SULFATE 0.1 % EX CREA
1.0000 "application " | TOPICAL_CREAM | Freq: Every day | CUTANEOUS | Status: DC
  Administered 2015-09-22 – 2015-09-24 (×3): 1 via TOPICAL
  Filled 2015-09-22: qty 15

## 2015-09-22 NOTE — Consult Note (Signed)
Renal Service Consult Note [facility]  [name] KitchenJames D Spaid 09/22/2015 Nereyda Bowler D Requesting Physician:  Dr Tat  Reason for Consult:  ESRD pt w N/V and swollen R arm/ hand HPI: The patient is a 75 y.o. year-old w hx of ESRD on PD in [place], [place].  Hx of COPD, DM, prior CVA, hx CABG.  He recently had stent placed last week about 5 days ago in [place].  Presented to ED yest with diffuse joint pain/ swelling and fevers to 101.  Also n/v for about 3 days.  PD fluid has been clear.  +DOE.  In ED WBC 16k, Cr 8.8.  CXR clear, LFT"s ok.  UA neg.  Admitted and started on IV vanc/zosyn.    No tob/ etoh.  Retired Journalist, newspaperauto mechanic, worked in [place], lives in [place] w his wife.  Started PD in Dec 2016.  He had PD related peritonitis in April 2017 treated as outpatient and recovered w/o difficulty.  He was also admitted in Feb 2017 in [place] for "CHF" per pts wife.  He has an AVF L arm which has never been used.    Main c/o is pain in both arms "from the wrist down", i.e. In the wrist joint and all the finger joints. Also both ankles and feet extremely painful, all this going on for the last several days. No hx gout.  He does have a history of Sjogren's syndrome, this was diagnosed last year by his kidney doctor in the workup of his kidney failure. He had a rash on his chest apparently prompting this w/u.  Never had renal bx.  He was referred to a rheum MD in [place] and per the wife he was treated with MTX and other medications but had side effects to "all of it" and is not on anything for this now.     ROS  denies CP  no joint pain   no HA  no blurry vision  no rash  no diarrhea  no nausea/ vomiting  no dysuria  no difficulty voiding  no change in urine color    Past Medical History  Past Medical History  Diagnosis Date  . Hypertension   . Diabetes mellitus without complication (HCC)   . Coronary artery disease   . COPD (chronic obstructive pulmonary disease) (HCC)   .  Stroke (HCC)   . Renal disorder   . CHF (congestive heart failure) [facility](HCC)    Past Surgical History  Past Surgical History  Procedure Laterality Date  . Coronary artery bypass graft  2011  . Cardiac catherization with stent placement  2017   Family History History reviewed. No pertinent family history. Social History  reports that he has quit smoking. He does not have any smokeless tobacco history on file. He reports that he does not drink alcohol or use illicit drugs. Allergies  Allergies  Allergen Reactions  . Lisinopril     hives  . Methotrexate Derivatives     blisters  . Sulfa Antibiotics     hives  . Tape     Hives   . Vancomycin     Blisters    Home medications Prior to Admission medications   Medication Sig Start Date End Date Taking? Authorizing Provider  aspirin EC 81 MG tablet Take 81 mg by mouth daily.   Yes Historical Provider, MD  atorvastatin (LIPITOR) 80 MG tablet Take 80 mg by mouth daily at 6 PM.   Yes Historical Provider, MD  carvedilol (COREG) 12.5 MG  tablet Take 12.5 mg by mouth 2 (two) times daily with a meal.   Yes Historical Provider, MD  docusate sodium (COLACE) 100 MG capsule Take 100 mg by mouth daily.   Yes Historical Provider, MD  furosemide (LASIX) 40 MG tablet Take 40 mg by mouth 2 (two) times daily.   Yes Historical Provider, MD  insulin NPH-regular Human (NOVOLIN 70/30) (70-30) 100 UNIT/ML injection Inject 50 Units into the skin daily with breakfast. And 50 units in the evening.   Yes Historical Provider, MD  isosorbide mononitrate (IMDUR) 120 MG 24 hr tablet Take 120 mg by mouth daily.   Yes Historical Provider, MD  sevelamer carbonate (RENVELA) 800 MG tablet Take 800 mg by mouth 3 (three) times daily with meals.   Yes Historical Provider, MD  ticagrelor (BRILINTA) 90 MG TABS tablet Take 90 mg by mouth 2 (two) times daily.   Yes Historical Provider, MD  Vitamin D, Ergocalciferol, (DRISDOL) 50000 units CAPS capsule Take 50,000 Units by mouth  every 7 (seven) days.   Yes Historical Provider, MD   Liver Function Tests  Recent Labs Lab 09/21/15 1100 09/22/15 0728  AST 22 21  ALT 19 16*  ALKPHOS 69 53  BILITOT 0.5 0.4  PROT 6.7 5.2*  ALBUMIN 2.7* 1.9*   No results for input(s): LIPASE, AMYLASE in the last 168 hours. CBC  Recent Labs Lab 09/21/15 1100 09/21/15 2116 09/22/15 0728  WBC 16.7* 19.1* 18.4*  NEUTROABS 13.6*  --   --   HGB 11.0* 10.7* 9.2*  HCT 31.2* 30.4* 27.5*  MCV 89.1 89.7 89.6  PLT 247 223 197   Basic Metabolic Panel  Recent Labs Lab 09/21/15 1100 09/21/15 2116 09/22/15 0728  NA 132*  --  132*  K 3.4*  --  4.2  CL 95*  --  95*  CO2 24  --  22  GLUCOSE 159*  --  141*  BUN 54*  --  61*  CREATININE 8.86* 9.58* 10.33*  CALCIUM 7.9* 7.5* 7.3*  PHOS  --  9.1*  --    Iron/TIBC/Ferritin/ %Sat No results found for: IRON, TIBC, FERRITIN, IRONPCTSAT  Filed Vitals:   09/22/15 0230 09/22/15 0545 09/22/15 0659 09/22/15 0700  BP: 127/47 122/53  130/79  Pulse: 45 89  94  Temp: 99 F (37.2 C) 98.9 F (37.2 C)  98.5 F (36.9 C)  TempSrc: Oral Oral  Oral  Resp: Height:    (1.753 m)   Weight:   92.715 kg (204 lb 6.4 oz)   SpO2: 98% 100%  94%   Exam Gen alert, no distress, retching No rash, cyanosis or gangrene Sclera anicteric, throat clear  JVP 12 -15 cm Chest clear bilat RRR soft sem no RG Abd soft ntnd +bs LLQ PD exit site is clean and dry, obese GU normal male  MS mild-mod swelling both hands and feet, R> L, tender to ROM bilat wrists and fingers and ankles Ext 1+ pretib edema / no wounds or ulcers Neuro is alert, Ox 3 , nf LUA AVF +bruit    Dialysis: Hangs 15 L fluid, mostly 2.5% and some 4.25 %, 4 exchang overnight 2500cc each, leaves 1000 cc in day bag and drains at 1 pm. Dry from 1p > 8pm resumes cycler.    Assessment: 1  Fever/ polyarthritis - agree gout, also consider autoimmune with hx of Sjogren's.  Would avoid colchicine for this as he is ESRD and dosing  is difficult, would recommend NSAID's  and/or steroids instead, have d/w primary.  2  ESRD on PD 3  N/V - no evidence of peritonitis 4  DM per prim 5  Vol is euvolemic on exam 6  CAD prior CABG, recent cor stent last week in [facility] - PD today, usual regimen  Vinson Moselle MD [facility] pager [phone]    cell [phone] 09/22/2015, 9:59 AM

## 2015-09-22 NOTE — Progress Notes (Signed)
Report called and given to Charito at [facility].

## 2015-09-22 NOTE — Progress Notes (Addendum)
Patient ID: Joshua [name], male   DOB: 22-Oct-1940, [age] y.o.   MRN: [id number]                                                                PROGRESS NOTE                                                                                                                                                                                                             Patient Demographics:    Joshua DownsJames [name], is a 75 y.o. male, DOB - 22-Oct-1940, WUJ:[id number]  Admit date - 09/21/2015   Admitting Physician Houston SirenPeter Le, MD  Outpatient Primary MD for the patient is Pcp Not In System  LOS - 1  Outpatient Specialists:   Chief Complaint  Patient presents with  . Joint Swelling       Brief Narrative   [name] is an 75 y.o. male with hx of DM, ESRD on PD, COPD, prior CVA, CAD s/p CABG, recent STEMI resulting in a cardiac cath a few days ago, with stent placement, elevated troponin being trended down, on DUAT with ASA and Brilinta, hx of CHF, presented to the ER with swelling, diffuse joint pain, and subjective fever. He had no rash, abdominal pain, coughs or SOB. In the past, he had joint aches, and frequently he has pain in the MTP joint. The right groin where he had his Cath last Friday was not painful. He does PD at home with the help of his wife who is at his bedside. Evaluation in the ER showed WBC of 16K, Cr of 8.8 with K of 3.4. His CXR did not show any infiltrate. LFTs were normal and UA was negative. He was started on IV Van/Zosyn for sepsis, and hospitalist was asked to admit him for further Tx. He was [facility] mostly and [name], and he did not want to go back there. His Lactic acid was negative, and he has hemodynamic stability. [facility] doesn't have peritoneal dialysis capacity and therefore transferring to [name].    Subjective:    [name] today has, ESRD on peritoneal dialysis and being sent to [name] due to inability of [facility] staff to  perform peritoneal dialysis    Assessment  & Plan :  Active Problems:   Peritonitis, dialysis-associated (HCC)   Arthritis   Sepsis (HCC)   Elevated troponin level   ESRD on peritoneal dialysis [facility])   CVA (cerebral infarction)     Possible sepsis:  ? SBP, cont vanco/ zosyn for now.   Urine and blood cultures pending  ESRD on PD, will send to [facility] for dialysis needs  (spoke w Dr. Hyman Hopes to arrange PD)  Elevated Troponin: It is trending down from his recent NSTEMI. He had a cath and a stent placed. Will need to continue with ASA and especially Brillinta.   Hx of CVA: Minimal residual deficit.   Arthritis: Doubt septic joint. Seems multiple joints. Could be gout. Will obtain uric acid. Give some IVF.   Code Status : FULL CODE  Family Communication  :    Disposition Plan  : uncertain  Barriers For Discharge :   Consults  :  Please consult nephrology regarding peritoneal dialysis  Procedures  :   DVT Prophylaxis  :  SCD, heparin  Lab Results  Component Value Date   PLT 223 09/21/2015    Antibiotics  :  Vanco, zosyn  Anti-infectives    Start     Dose/Rate Route Frequency Ordered Stop   09/21/15 2200  piperacillin-tazobactam (ZOSYN) IVPB 2.25 g     2.25 g 100 mL/hr over 30 Minutes Intravenous Every 8 hours 09/21/15 2036     09/21/15 1330  piperacillin-tazobactam (ZOSYN) IVPB 3.375 g     3.375 g 100 mL/hr over 30 Minutes Intravenous  Once 09/21/15 1325 09/21/15 1428        Objective:   Filed Vitals:   09/21/15 1822 09/21/15 1824 09/21/15 1900 09/21/15 2139  BP: 127/60  125/62 139/41  Pulse: 75  111 71  Temp:   97.9 F (36.6 C) 98.8 F (37.1 C)  TempSrc:   Oral Oral  Resp: Height:      Weight:  91.173 kg (201 lb)    SpO2: 99%  93% 99%    Wt Readings from Last 3 Encounters:  09/21/15 91.173 kg (201 lb)    No intake or output data in the 24 hours ending 09/22/15 0022   Physical Exam  Awake Alert, Oriented X 3, No new F.N  deficits, Normal affect Avery.AT,PERRAL Supple Neck,No JVD, No cervical lymphadenopathy appriciated.  Symmetrical Chest wall movement, Good air movement bilaterally, CTAB RRR,No Gallops,Rubs or new Murmurs, No Parasternal Heave +ve B.Sounds, Abd Soft, No tenderness, No organomegaly appriciated, No rebound - guarding or rigidity.  PD catheter in place No Cyanosis, Clubbing or edema, No new Rash or bruise     Data Review:    CBC  Recent Labs Lab 09/21/15 1100 09/21/15 2116  WBC 16.7* 19.1*  HGB 11.0* 10.7*  HCT 31.2* 30.4*  PLT 247 223  MCV 89.1 89.7  MCH 31.4 31.6  MCHC 35.3 35.2  RDW 12.9 13.0  LYMPHSABS 1.0  --   MONOABS 2.1*  --   EOSABS 0.0  --   BASOSABS 0.0  --     Chemistries   Recent Labs Lab 09/21/15 1100 09/21/15 2116  NA 132*  --   K 3.4*  --   CL 95*  --   CO2 24  --   GLUCOSE 159*  --   BUN 54*  --   CREATININE 8.86* 9.58*  CALCIUM 7.9* 7.5*  MG  --  1.4*  AST 22  --   ALT 19  --  ALKPHOS 69  --   BILITOT 0.5  --    ------------------------------------------------------------------------------------------------------------------ No results for input(s): CHOL, HDL, LDLCALC, TRIG, CHOLHDL, LDLDIRECT in the last 72 hours.  No results found for: HGBA1C ------------------------------------------------------------------------------------------------------------------  Recent Labs  09/21/15 1542  TSH 3.758   ------------------------------------------------------------------------------------------------------------------ No results for input(s): VITAMINB12, FOLATE, FERRITIN, TIBC, IRON, RETICCTPCT in the last 72 hours.  Coagulation profile  Recent Labs Lab 09/21/15 1100  INR 1.22    No results for input(s): DDIMER in the last 72 hours.  Cardiac Enzymes No results for input(s): CKMB, TROPONINI, MYOGLOBIN in the last 168 hours.  Invalid input(s):  CK ------------------------------------------------------------------------------------------------------------------    Component Value Date/Time   BNP 821.0* 09/21/2015 1100    Inpatient Medications  Scheduled Meds: . aspirin EC  81 mg Oral Daily  . atorvastatin  80 mg Oral q1800  . carvedilol  12.5 mg Oral BID WC  . docusate sodium  100 mg Oral Daily  . heparin  5,000 Units Subcutaneous Q8H  . insulin aspart  0-5 Units Subcutaneous QHS  . insulin aspart  0-9 Units Subcutaneous TID WC  . insulin glargine  20 Units Subcutaneous QHS  . isosorbide mononitrate  120 mg Oral Daily  . piperacillin-tazobactam (ZOSYN)  IV  2.25 g Intravenous Q8H  . sevelamer carbonate  800 mg Oral TID WC  . sodium chloride flush  3 mL Intravenous Q12H  . ticagrelor  90 mg Oral BID  . Vitamin D (Ergocalciferol)  50,000 Units Oral Q7 days   Continuous Infusions: . sodium chloride 1,000 mL (09/21/15 1356)   PRN Meds:.ondansetron **OR** ondansetron (ZOFRAN) IV, traMADol  Micro Results Recent Results (from the past 240 hour(s))  Body fluid culture     Status: None (Preliminary result)   Collection Time: 09/21/15  3:15 PM  Result Value Ref Range Status   Specimen Description PERITONEAL Performed at [facility]   Final   Special Requests NONE  Final   Gram Stain   Final    CYTOSPIN SMEAR RARE WBC SEEN NO ORGANISMS SEEN Performed at [facility]    Culture PENDING  Incomplete   Report Status PENDING  Incomplete    Radiology Reports Dg Chest 2 View  09/21/2015  CLINICAL DATA:  Status post coronary artery stent placement 09/18/2015. Generalized swelling. Bilateral lower extremity pain. Initial encounter. EXAM: CHEST  2 VIEW COMPARISON:  None. FINDINGS: The patient is status post CABG. The lungs are clear. Heart size is upper normal. No pneumothorax or pleural effusion. IMPRESSION: No acute disease. Electronically Signed   By: Drusilla Kanner M.D.   On: 09/21/2015 12:32   US Venous  Img Upper Uni Right  09/21/2015  CLINICAL DATA:  Coronary stent placement on 09/18/2015 with right upper extremity pain edema and shortness of breath. Evaluate for DVT. History of diabetes and smoking. EXAM: RIGHT UPPER EXTREMITY VENOUS DOPPLER ULTRASOUND TECHNIQUE: Gray-scale sonography with graded compression, as well as color Doppler and duplex ultrasound were performed to evaluate the upper extremity deep venous system from the level of the subclavian vein and including the jugular, axillary, basilic, radial, ulnar and upper cephalic vein. Spectral Doppler was utilized to evaluate flow at rest and with distal augmentation maneuvers. COMPARISON:  None. FINDINGS: Contralateral Subclavian Vein: Respiratory phasicity is normal and symmetric with the symptomatic side. No evidence of thrombus. Normal compressibility. Internal Jugular Vein: No evidence of thrombus. Normal compressibility, respiratory phasicity and response to augmentation. Subclavian Vein: No evidence of thrombus. Normal compressibility, respiratory phasicity and response  to augmentation. Axillary Vein: No evidence of thrombus. Normal compressibility, respiratory phasicity and response to augmentation. Cephalic Vein: No evidence of thrombus. Normal compressibility, respiratory phasicity and response to augmentation. Basilic Vein: No evidence of thrombus. Normal compressibility, respiratory phasicity and response to augmentation. Brachial Veins: No evidence of thrombus. Normal compressibility, respiratory phasicity and response to augmentation. Radial Veins: No evidence of thrombus. Normal compressibility, respiratory phasicity and response to augmentation. Ulnar Veins: No evidence of thrombus. Normal compressibility, respiratory phasicity and response to augmentation. Venous Reflux:  None visualized. Other Findings:  None visualized. IMPRESSION: No evidence of DVT within the right upper extremity. Electronically Signed   By: Simonne Come M.D.   On:  09/21/2015 14:01   Dg Abd 2 Views  09/21/2015  CLINICAL DATA:  Decreased urine output. EXAM: ABDOMEN - 2 VIEW COMPARISON:  None. FINDINGS: Upright film shows no evidence for intraperitoneal free air. Diffuse gaseous distention of small bowel is evident with nondilated colon containing stool and possibly oral contrast material. Peritoneal dialysis catheter seen coiled in the right lower quadrant the abdomen. Multiple wires from telemetry device overlying the abdomen. IMPRESSION: Diffuse gaseous bowel distention. Imaging features do not suggest a frank, overt small bowel obstruction, but could be compatible with ileus. Electronically Signed   By: Kennith [name] M.D.   On: 09/21/2015 12:30    Time Spent in minutes  30   Pearson Grippe M.D on 09/22/2015 at 12:22 AM  Between 7am to 7pm - Pager - [phone]  After 7pm go to [email or web address] - password [facility]  [facility] -  Office  [phone]

## 2015-09-22 NOTE — Consult Note (Signed)
CARDIOLOGY CONSULT NOTE   Patient ID: [name] MRN: [id number] DOB/AGE: 04/12/1940 75 y.o.  Admit date: 09/21/2015  Primary Physician   Pcp Not In System Primary Cardiologist   [place] (Dr. Teofilo PodZagol) Reason for Consultation   Elevated troponin  Requesting Physician  Dr. Arbutus Leasat  HPI: [name] is a 75 y.o. male with a history of CAD s/p CABG, recent non-STEMI s/p stent placement, end-stage renal disease on peritoneal dialysis, COPD, CVA, DM, Sjogren's syndrome and CHF who presented for evaluation of swelling and diffuse joint pain and fever.   The patient came from vacation Thursday 6/8. He drove up with family. Early Friday morning 6/9 around 12 PM he woke up with the chest pain symptoms concerning for unstable angina. [facility] was [name] for [facility] and had a stent placement (records unavailable to review) and discharged on 09/19/15. At that time peak of troponin was 6 per family. Plan to get echocardiogram as outpatient.  Sunday morning 6/11 he woke up with a right sided upper and lower extremity swelling and pain. In evening he was again evaluated at [name] with workup negative for any blood clot (no records available). Patient had unsuccessful dialysis Sunday night.  Monday 6/12 his symptoms worsen and he noted left-sided extremity swelling as well. He called his primary cardiologist who advised him to go to the ED for further evaluation. He chooses to go to [name] for further evaluation. He also complained of subjective fever. WBC of 16K, Cr of 8.8 with K of 3.4. His CXR did not show any infiltrate. LFTs were normal and UA was negative.I stat troponin of 2.08. Uric acid of 9. EKG showed sinus rhythm at rate of 99 bpm, ventricular bigemny, repolarization abnormality. Creatinine trending up, now 10.33.  He was admitted for sepsis and started on IV antibiotic. Because of inability to perform peritoneal dialysis, the patient was transferred  [name]He hasn't had dialysis last night. He was seen by nephrology and plan to resume tonight. Patient admits to having shortness of breath. No chest pain. Right upper extremity ultrasound did not showed DVT.    Past Medical History  Diagnosis Date  . Hypertension   . Diabetes mellitus without complication (HCC)   . Coronary artery disease     CABG 2011  . COPD (chronic obstructive pulmonary disease) (HCC)   . Stroke (HCC)   . ESRD on peritoneal dialysis [facility](HCC)     [place].   . CHF (congestive heart failure) (HCC)   . History of peritonitis     PD cath related peritonitis in April 2017  . Sjogren's syndrome (HCC)     Per pt's wife, was diagnosed in 2016 during w/u for cause of renal failure prior to starting dialysis.  He had a rash on his chest apparently prompting this w/u.  Never had renal bx.  He was referred to [place] rheum [place] in [place] and per the wife he was treated with MTX and other medications but had side effects to "all of it" and isn't taking anything for this as of Jun 2017.      Past Surgical History  Procedure Laterality Date  . Coronary artery bypass graft  2011  . Cardiac catherization with stent placement  2017    Allergies  Allergen Reactions  . Lisinopril     hives  . Methotrexate Derivatives     blisters  . Sulfa  Antibiotics     hives  . Tape     Hives   . Vancomycin     Blisters     I have reviewed the patient's current medications . aspirin EC  81 mg Oral Daily  . atorvastatin  80 mg Oral q1800  . carvedilol  12.5 mg Oral BID WC  . dialysis solution 2.5% low-MG/low-CA   Intraperitoneal Q24H  . dialysis solution 2.5% low-MG/low-CA   Intraperitoneal Q24H  . docusate sodium  100 mg Oral Daily  . gentamicin cream  1 application Topical Daily  . heparin  5,000 Units Subcutaneous Q8H  . insulin aspart  0-5 Units Subcutaneous QHS  . insulin aspart  0-9 Units Subcutaneous TID WC  . insulin  glargine  20 Units Subcutaneous QHS  . isosorbide mononitrate  120 mg Oral Daily  . piperacillin-tazobactam (ZOSYN)  IV  2.25 g Intravenous Q8H  . predniSONE  60 mg Oral Q breakfast  . sevelamer carbonate  800 mg Oral TID WC  . sodium chloride flush  3 mL Intravenous Q12H  . ticagrelor  90 mg Oral BID  . Vitamin D (Ergocalciferol)  50,000 Units Oral Q7 days   . sodium chloride 1,000 mL (09/22/15 1015)   heparin, heparin, ondansetron **OR** ondansetron (ZOFRAN) IV, traMADol  Prior to Admission medications   Medication Sig Start Date End Date Taking? Authorizing Provider  aspirin EC 81 MG tablet Take 81 mg by mouth daily.   Yes Historical Provider, MD  atorvastatin (LIPITOR) 80 MG tablet Take 80 mg by mouth daily at 6 PM.   Yes Historical Provider, MD  carvedilol (COREG) 12.5 MG tablet Take 12.5 mg by mouth 2 (two) times daily with a meal.   Yes Historical Provider, MD  docusate sodium (COLACE) 100 MG capsule Take 100 mg by mouth daily.   Yes Historical Provider, MD  furosemide (LASIX) 40 MG tablet Take 40 mg by mouth 2 (two) times daily.   Yes Historical Provider, MD  insulin NPH-regular Human (NOVOLIN 70/30) (70-30) 100 UNIT/ML injection Inject 50 Units into the skin daily with breakfast. And 50 units in the evening.   Yes Historical Provider, MD  isosorbide mononitrate (IMDUR) 120 MG 24 hr tablet Take 120 mg by mouth daily.   Yes Historical Provider, MD  sevelamer carbonate (RENVELA) 800 MG tablet Take 800 mg by mouth 3 (three) times daily with meals.   Yes Historical Provider, MD  ticagrelor (BRILINTA) 90 MG TABS tablet Take 90 mg by mouth 2 (two) times daily.   Yes Historical Provider, MD  Vitamin D, Ergocalciferol, (DRISDOL) 50000 units CAPS capsule Take 50,000 Units by mouth every 7 (seven) days.   Yes Historical Provider, MD     Social History   Social History  . Marital Status: Married    Spouse Name: N/A  . Number of Children: N/A  . Years of Education: N/A   Occupational  History  . Not on file.   Social History Main Topics  . Smoking status: Former Games developer  . Smokeless tobacco: Not on file  . Alcohol Use: No  . Drug Use: No  . Sexual Activity: Not on file   Other Topics Concern  . Not on file   Social History Narrative    No family status information on file.    No family hx of heart disease.   ROS:  Full 14 point review of systems complete and found to be negative unless listed above.  Physical Exam: Blood pressure 130/79, pulse  94, temperature 98.5 F (36.9 C), temperature source Oral, resp. rate 17, height 5\' 9"  (1.753 m), weight 204 lb 6.4 oz (92.715 kg), SpO2 94 %.  General: Well developed, well nourished, male in no acute distress however sweating Head: Eyes PERRLA, No xanthomas. Normocephalic and atraumatic, oropharynx without edema or exudate.  Lungs: Resp regular and unlabored, bibasilar rales Heart: RRR no s3, s4, or murmurs..   Neck: No carotid bruits. No lymphadenopathy.  JVD. Abdomen: Bowel sounds present, distended Msk:  No spine or cva tenderness.  Extremities: No clubbing, cyanosis. Trace BL LE  edema. Bilateral ankle tender to palpation with edema. Right upper extremity has more edema than left. Right groin cath site without hematoma however has very mild bruise. Neuro: Alert and oriented X 3. No focal deficits noted. Psych:  Good affect, responds appropriately Skin: No rashes or lesions noted.  Labs:   Lab Results  Component Value Date   WBC 18.4* 09/22/2015   HGB 9.2* 09/22/2015   HCT 27.5* 09/22/2015   MCV 89.6 09/22/2015   PLT 197 09/22/2015    Recent Labs  09/21/15 1100  INR 1.22    Recent Labs Lab 09/22/15 0728  NA 132*  K 4.2  CL 95*  CO2 22  BUN 61*  CREATININE 10.33*  CALCIUM 7.3*  PROT 5.2*  BILITOT 0.4  ALKPHOS 53  ALT 16*  AST 21  GLUCOSE 141*  ALBUMIN 1.9*   MAGNESIUM  Date Value Ref Range Status  09/21/2015 1.4* 1.7 - 2.4 mg/dL Final   No results for input(s): CKTOTAL, CKMB,  TROPONINI in the last 72 hours.  Recent Labs  09/21/15 1109  TROPIPOC 2.08*   No results found for: PROBNP No results found for: CHOL, HDL, LDLCALC, TRIG No results found for: DDIMER No results found for: LIPASE, AMYLASE TSH  Date/Time Value Ref Range Status  09/21/2015 03:42 PM 3.758 0.350 - 4.500 uIU/mL Final   No results found for: VITAMINB12, FOLATE, FERRITIN, TIBC, IRON, RETICCTPCT    ECG:   EKG showed sinus rhythm at rate of 99 bpm, ventricular bigemny, repolarization abnormality.   Radiology:  Dg Chest 2 View  09/21/2015  CLINICAL DATA:  Status post coronary artery stent placement 09/18/2015. Generalized swelling. Bilateral lower extremity pain. Initial encounter. EXAM: CHEST  2 VIEW COMPARISON:  None. FINDINGS: The patient is status post CABG. The lungs are clear. Heart size is upper normal. No pneumothorax or pleural effusion. IMPRESSION: No acute disease. Electronically Signed   By: Drusilla Kanner M.D.   On: 09/21/2015 12:32   US Venous Img Upper Uni Right  09/21/2015  CLINICAL DATA:  Coronary stent placement on 09/18/2015 with right upper extremity pain edema and shortness of breath. Evaluate for DVT. History of diabetes and smoking. EXAM: RIGHT UPPER EXTREMITY VENOUS DOPPLER ULTRASOUND TECHNIQUE: Gray-scale sonography with graded compression, as well as color Doppler and duplex ultrasound were performed to evaluate the upper extremity deep venous system from the level of the subclavian vein and including the jugular, axillary, basilic, radial, ulnar and upper cephalic vein. Spectral Doppler was utilized to evaluate flow at rest and with distal augmentation maneuvers. COMPARISON:  None. FINDINGS: Contralateral Subclavian Vein: Respiratory phasicity is normal and symmetric with the symptomatic side. No evidence of thrombus. Normal compressibility. Internal Jugular Vein: No evidence of thrombus. Normal compressibility, respiratory phasicity and response to augmentation. Subclavian  Vein: No evidence of thrombus. Normal compressibility, respiratory phasicity and response to augmentation. Axillary Vein: No evidence of thrombus. Normal compressibility, respiratory phasicity and  response to augmentation. Cephalic Vein: No evidence of thrombus. Normal compressibility, respiratory phasicity and response to augmentation. Basilic Vein: No evidence of thrombus. Normal compressibility, respiratory phasicity and response to augmentation. Brachial Veins: No evidence of thrombus. Normal compressibility, respiratory phasicity and response to augmentation. Radial Veins: No evidence of thrombus. Normal compressibility, respiratory phasicity and response to augmentation. Ulnar Veins: No evidence of thrombus. Normal compressibility, respiratory phasicity and response to augmentation. Venous Reflux:  None visualized. Other Findings:  None visualized. IMPRESSION: No evidence of DVT within the right upper extremity. Electronically Signed   By: Simonne Come M.D.   On: 09/21/2015 14:01   Dg Abd 2 Views  09/21/2015  CLINICAL DATA:  Decreased urine output. EXAM: ABDOMEN - 2 VIEW COMPARISON:  None. FINDINGS: Upright film shows no evidence for intraperitoneal free air. Diffuse gaseous distention of small bowel is evident with nondilated colon containing stool and possibly oral contrast material. Peritoneal dialysis catheter seen coiled in the right lower quadrant the abdomen. Multiple wires from telemetry device overlying the abdomen. IMPRESSION: Diffuse gaseous bowel distention. Imaging features do not suggest a frank, overt small bowel obstruction, but could be compatible with ileus. Electronically Signed   By: Kennith Center M.D.   On: 09/21/2015 12:30    ASSESSMENT AND PLAN:     1. Elevated troponin - Troponin of 2 in ED yesterday. Per wife his troponins was elevated to 6 during admission 6/9. Currently no chest pain. Will cycle troponin. No further work up if trending down.   2. Ventricular bigemeny - EKG  and telemetry shows ventricular bigeminy. Patient has no complaint of chest pain. Given recent non-STEMI will follow closely. We have requested records. Titrate coreg as needed.   3. CAD s/p CABG - Recently stent placement for non-STEMI. Plan to get echocardiogram as outpatient.  Continue ASA, Brillinta, statin, imdur and BB. No   4. polyarthritis and suspected gout - per primary  5. ESRD on PD - Has partial dialysis Sunday night and missed dialysis last night. He looks volume overloaded and having some dyspnea. Plan to resume PD tonight. Nephrology is following.   6. DM - Per primary  7. Acute respiratory failure with hypoxia - Likely due to missed dialysis.   8. Extremities edema - Upper extremity edema did not showed DVT. Blood work did not show any signs of blood clot during workup at [name]. Records requested.   SignedManson Passey, PA 09/22/2015, 1:51 PM Pager [phone]  Co-Sign MD  Agree with note by Chelsea Aus PA-C  H/O CAD s/p CABG in [place] 2011. Recent NSTEMI S/P PCI/STENT last Fri in [place]. CRI on HD.  EKG does show V bigem. Adjust BB. He does home PD. Hasn't done this in 2 D. WBC elevated. No CP. Increased SOB prob secondary to vol overload from missing daily PD. Will cycle enz and document continued trending down. Nothing further to add. On DAPT with Brilenta. F/U with his cardiologist in [place].   Runell Gess, M.D., FACP, [name], [name] [address] [place], [place]  [address]  [phone] 09/22/2015 3:25 PM

## 2015-09-22 NOTE — Progress Notes (Signed)
CRITICAL VALUE ALERT  Critical value received:  Troponin 1.42  Date of notification:  09/22/2015  Time of notification:  1713  Critical value read back:Yes.    Nurse who received alert:  Meriel FlavorsAngelica Nyanna Heideman,RN  MD notified (1st page):  Dr. Arbutus Leasat   Time of first page:  1714   Responding MD: Dr. Clovis Fredricksonat-no new orders at this time.  Time MD responded:  1714

## 2015-09-22 NOTE — Progress Notes (Signed)
Did not complete first CAPD until 1:30. Patient was slow to fill.  Alonna Bucklerita Brown, NP notified.  Will move 4 pm exchange to 5:30 pm per Alonna Bucklerita Brown, NP.

## 2015-09-22 NOTE — Progress Notes (Signed)
[facility] arrived to [facility]. Family at bedside. Belonging given to wife. Patient in stable condition. Transported via stretcher.

## 2015-09-22 NOTE — Progress Notes (Addendum)
PROGRESS NOTE  [name] ZOX:[id number] DOB: [birth year] DOA: 09/21/2015 PCP: Pcp Not In System  Brief History:  75 y.o. male with hx of DM, ESRD on PD, COPD, prior CVA, CAD s/p CABG, recent NSTEMI resulting in a cardiac cath a few days ago, with stent placement, elevated troponin being trended down, on DAPT with ASA and Brilinta, hx of CHF, presented to the ER with swelling, diffuse joint pain, and subjective fever. The patient was most recently treated at [facility], and the patient did not want to go back there. The patient had NSTEMI and had stent x 1 placed and d/ced from [facility] on 09/19/15.  He was seen in the emergency department at [facility]. Because of inability to perform peritoneal dialysis, the patient was transferred to [facility].  The patient states that he has had previous arthritis/arthralgias in his bilateral first MTP joints that usually lasts 3-4 days and resolve spontaneously in the past. He denies any headache, neck pain, chest pain, coughing, hemoptysis, diarrhea. The patient did have 3 episodes of nausea and vomiting, but has not had any vomiting since admission to the hospital.  Assessment/Plan: SIRS -Manifested by fever and tachycardia -Doubt PD related peritonitis as peritoneal fluid had 0 WBC -lactic acid 1.0  Elevated troponin -Patient had recent NSTEMI--unfortunately her medical records unavailable at this time from [facility] -Continue aspirin and Brilinta -Certainly this may be elevated from the patient's ESRD status -no chest pain presently -Continue Lipitor 80 mg daily -Continue carvedilol and Imdur -Cardiology consult  Acute respiratory failure with hypoxia -Suspect hypoventilation secondary to abdominal distention -09/21/2015 Chest x-ray negative for acute findings -Abdominal x-ray suggested increased gas distention suggestive of ileus without obstruction -Presently stable on 2 L -wean to  RA  Polyarthritis/hyperuricemia -Uric acid 9.0 -Suspect patient has gouty flare -d/c Dr. Denese Killings not use colchicine -start prednisone -pt gives hx of diagnosis of Sjogrens--last saw rheum Jan 2017--could not tolerate MTX or sulfasalazine-->rash -check C3, C4, CH50, SSA, SSB  Ileus -clear liquids for now -may need NG if vomiting recurs  ESRD on PD -nephrology consulted to assist with management  Diabetes mellitus type 2 -Normally on 70/30--50 units bid -Lantus 20 units daily for now -ISS -A1C  Lower extremity pain and edema -duplex r/o DVT   Disposition Plan:   Home in 2-3 days  Family Communication:   Wife update at beside  Consultants:  Nephrology, cardiology  Code Status:  FULL   Subjective: Patient complains of pain in his joints particularly in his right ankle and right foot as well as bilateral wrists. Denies any headache, neck pain, vomiting, diarrhea, abdominal pain, chest pain. Has had some intermittent shortness of breath. Denies hemoptysis, dysuria.  Objective: Filed Vitals:   09/22/15 0230 09/22/15 0545 09/22/15 0659 09/22/15 0700  BP: 127/47 122/53  130/79  Pulse: 45 89  94  Temp: 99 F (37.2 C) 98.9 F (37.2 C)  98.5 F (36.9 C)  TempSrc: Oral Oral  Oral  Resp: 18 17  17   Height:   5\' 9"  (1.753 m)   Weight:   92.715 kg (204 lb 6.4 oz)   SpO2: 98% 100%  94%    Intake/Output Summary (Last 24 hours) at 09/22/15 0835 Last data filed at 09/22/15 0612  Gross per 24 hour  Intake    120 ml  Output      0 ml  Net    120 ml   Weight  change:  Exam:   General:  Pt is alert, follows commands appropriately, not in acute distress  HEENT: No icterus, No thrush, No neck mass, [place]/AT  Cardiovascular: RRR, S1/S2, no rubs, no gallops  Respiratory: Bibasilar crackles. No wheezing.  Abdomen: Soft/+BS, non tender, non distended, no guarding  Extremities: 1 + LE edema, No lymphangitis, No petechiae, No rashes, synovitis bilateral wrists.  Tenderness to the first MTP joints bilateral   Data Reviewed: I have personally reviewed following labs and imaging studies Basic Metabolic Panel:  Recent Labs Lab 09/21/15 1100 09/21/15 2116  NA 132*  --   K 3.4*  --   CL 95*  --   CO2 24  --   GLUCOSE 159*  --   BUN 54*  --   CREATININE 8.86* 9.58*  CALCIUM 7.9* 7.5*  MG  --  1.4*  PHOS  --  9.1*   Liver Function Tests:  Recent Labs Lab 09/21/15 1100  AST 22  ALT 19  ALKPHOS 69  BILITOT 0.5  PROT 6.7  ALBUMIN 2.7*   No results for input(s): LIPASE, AMYLASE in the last 168 hours. No results for input(s): AMMONIA in the last 168 hours. Coagulation Profile:  Recent Labs Lab 09/21/15 1100  INR 1.22   CBC:  Recent Labs Lab 09/21/15 1100 09/21/15 2116 09/22/15 0728  WBC 16.7* 19.1* 18.4*  NEUTROABS 13.6*  --   --   HGB 11.0* 10.7* 9.2*  HCT 31.2* 30.4* 27.5*  MCV 89.1 89.7 89.6  PLT 247 223 197   Cardiac Enzymes: No results for input(s): CKTOTAL, CKMB, CKMBINDEX, TROPONINI in the last 168 hours. BNP: Invalid input(s): POCBNP CBG:  Recent Labs Lab 09/21/15 2135 09/22/15 0241 09/22/15 0753  GLUCAP 90 103* 148*   HbA1C: No results for input(s): HGBA1C in the last 72 hours. Urine analysis:    Component Value Date/Time   COLORURINE YELLOW 09/21/2015 1218   APPEARANCEUR HAZY* 09/21/2015 1218   LABSPEC 1.020 09/21/2015 1218   PHURINE 5.5 09/21/2015 1218   GLUCOSEU 250* 09/21/2015 1218   HGBUR MODERATE* 09/21/2015 1218   BILIRUBINUR NEGATIVE 09/21/2015 1218   KETONESUR NEGATIVE 09/21/2015 1218   PROTEINUR >300* 09/21/2015 1218   NITRITE NEGATIVE 09/21/2015 1218   LEUKOCYTESUR NEGATIVE 09/21/2015 1218   Sepsis Labs: (procalcitonin:4,lacticidven:4) ) Recent Results (from the past 240 hour(s))  Body fluid culture     Status: None (Preliminary result)   Collection Time: 09/21/15  3:15 PM  Result Value Ref Range Status   Specimen Description PERITONEAL Performed at [facility]   Final   Special Requests NONE  Final   Gram Stain   Final    CYTOSPIN SMEAR RARE WBC SEEN NO ORGANISMS SEEN Performed at [facility]    Culture PENDING  Incomplete   Report Status PENDING  Incomplete     Scheduled Meds: . aspirin EC  81 mg Oral Daily  . atorvastatin  80 mg Oral q1800  . carvedilol  12.5 mg Oral BID WC  . docusate sodium  100 mg Oral Daily  . heparin  5,000 Units Subcutaneous Q8H  . insulin aspart  0-5 Units Subcutaneous QHS  . insulin aspart  0-9 Units Subcutaneous TID WC  . insulin glargine  20 Units Subcutaneous QHS  . isosorbide mononitrate  120 mg Oral Daily  . piperacillin-tazobactam (ZOSYN)  IV  2.25 g Intravenous Q8H  . sevelamer carbonate  800 mg Oral TID WC  . sodium chloride flush  3 mL Intravenous Q12H  .  ticagrelor  90 mg Oral BID  . Vitamin D (Ergocalciferol)  50,000 Units Oral Q7 days   Continuous Infusions: . sodium chloride 1,000 mL (09/21/15 1356)    Procedures/Studies: Dg Chest 2 View  09/21/2015  CLINICAL DATA:  Status post coronary artery stent placement 09/18/2015. Generalized swelling. Bilateral lower extremity pain. Initial encounter. EXAM: CHEST  2 VIEW COMPARISON:  None. FINDINGS: The patient is status post CABG. The lungs are clear. Heart size is upper normal. No pneumothorax or pleural effusion. IMPRESSION: No acute disease. Electronically Signed   By: Drusilla Kannerhomas  Dalessio M.D.   On: 09/21/2015 12:32   [place] Venous Img Upper Uni Right  09/21/2015  CLINICAL DATA:  Coronary stent placement on 09/18/2015 with right upper extremity pain edema and shortness of breath. Evaluate for DVT. History of diabetes and smoking. EXAM: RIGHT UPPER EXTREMITY VENOUS DOPPLER ULTRASOUND TECHNIQUE: Gray-scale sonography with graded compression, as well as color Doppler and duplex ultrasound were performed to evaluate the upper extremity deep venous system from the level of the subclavian vein and including the jugular, axillary, basilic, radial,  ulnar and upper cephalic vein. Spectral Doppler was utilized to evaluate flow at rest and with distal augmentation maneuvers. COMPARISON:  None. FINDINGS: Contralateral Subclavian Vein: Respiratory phasicity is normal and symmetric with the symptomatic side. No evidence of thrombus. Normal compressibility. Internal Jugular Vein: No evidence of thrombus. Normal compressibility, respiratory phasicity and response to augmentation. Subclavian Vein: No evidence of thrombus. Normal compressibility, respiratory phasicity and response to augmentation. Axillary Vein: No evidence of thrombus. Normal compressibility, respiratory phasicity and response to augmentation. Cephalic Vein: No evidence of thrombus. Normal compressibility, respiratory phasicity and response to augmentation. Basilic Vein: No evidence of thrombus. Normal compressibility, respiratory phasicity and response to augmentation. Brachial Veins: No evidence of thrombus. Normal compressibility, respiratory phasicity and response to augmentation. Radial Veins: No evidence of thrombus. Normal compressibility, respiratory phasicity and response to augmentation. Ulnar Veins: No evidence of thrombus. Normal compressibility, respiratory phasicity and response to augmentation. Venous Reflux:  None visualized. Other Findings:  None visualized. IMPRESSION: No evidence of DVT within the right upper extremity. Electronically Signed   By: Simonne ComeJohn  Watts M.D.   On: 09/21/2015 14:01   Dg Abd 2 Views  09/21/2015  CLINICAL DATA:  Decreased urine output. EXAM: ABDOMEN - 2 VIEW COMPARISON:  None. FINDINGS: Upright film shows no evidence for intraperitoneal free air. Diffuse gaseous distention of small bowel is evident with nondilated colon containing stool and possibly oral contrast material. Peritoneal dialysis catheter seen coiled in the right lower quadrant the abdomen. Multiple wires from telemetry device overlying the abdomen. IMPRESSION: Diffuse gaseous bowel distention.  Imaging features do not suggest a frank, overt small bowel obstruction, but could be compatible with ileus. Electronically Signed   By: Kennith CenterEric  Mansell M.D.   On: 09/21/2015 12:30    Emon Miggins, DO  [facility] Pager [phone]  If 7PM-7AM, please contact night-coverage [email or web address] Password [email or web address] 09/22/2015, 8:35 AM   LOS: 1 day

## 2015-09-22 NOTE — Plan of Care (Signed)
[facility], family at bedside Placed on tele box #14 Dual RN skin assessment completed (WNL) Patient complaining of some nausea (medicated with Zofran IV) Oriented to unit

## 2015-09-22 NOTE — Progress Notes (Signed)
PD fluid not warm enough to administer to pt at time of scheduled 1730 PD exchange. Dr. Eliott Nineunham notified.  MD gave order to skip this exchange and place pt on cycler tonight as scheduled.   Joshua CluckKadeesha Anadelia Kintz, RN, [facility] [address] [place] Phone [phone]

## 2015-09-22 NOTE — Care Management Important Message (Signed)
Important Message  Patient Details  Name: [name] KitchenJames D Bulson MRN: [id number] Date of Birth: [birth year]   Medicare Important Message Given:  Yes    Bernadette HoitShoffner, Nemiah Bubar Coleman 09/22/2015, 8:55 AM

## 2015-09-23 DIAGNOSIS — Z794 Long term (current) use of insulin: Secondary | ICD-10-CM

## 2015-09-23 DIAGNOSIS — E0865 Diabetes mellitus due to underlying condition with hyperglycemia: Secondary | ICD-10-CM

## 2015-09-23 LAB — BASIC METABOLIC PANEL
Anion gap: 17 — ABNORMAL HIGH (ref 5–15)
BUN: 69 mg/dL — AB (ref 6–20)
CO2: 19 mmol/L — ABNORMAL LOW (ref 22–32)
Calcium: 7.4 mg/dL — ABNORMAL LOW (ref 8.9–10.3)
Chloride: 92 mmol/L — ABNORMAL LOW (ref 101–111)
Creatinine, Ser: 10.31 mg/dL — ABNORMAL HIGH (ref 0.61–1.24)
GFR calc Af Amer: 5 mL/min — ABNORMAL LOW (ref >60)
GFR, EST NON AFRICAN AMERICAN: 4 mL/min — AB (ref >60)
GLUCOSE: 361 mg/dL — AB (ref 65–99)
POTASSIUM: 3.8 mmol/L (ref 3.5–5.1)
Sodium: 128 mmol/L — ABNORMAL LOW (ref 135–145)

## 2015-09-23 LAB — HEMOGLOBIN A1C
Hgb A1c MFr Bld: 9.4 % — ABNORMAL HIGH (ref 4.8–5.6)
Mean Plasma Glucose: 223 mg/dL

## 2015-09-23 LAB — SJOGRENS SYNDROME-B EXTRACTABLE NUCLEAR ANTIBODY: SSB (LA) (ENA) ANTIBODY, IGG: 1.3 AI — AB (ref 0.0–0.9)

## 2015-09-23 LAB — RENAL FUNCTION PANEL
Albumin: 1.8 g/dL — ABNORMAL LOW (ref 3.5–5.0)
Anion gap: 16 — ABNORMAL HIGH (ref 5–15)
BUN: 67 mg/dL — AB (ref 6–20)
CHLORIDE: 92 mmol/L — AB (ref 101–111)
CO2: 21 mmol/L — ABNORMAL LOW (ref 22–32)
Calcium: 7.2 mg/dL — ABNORMAL LOW (ref 8.9–10.3)
Creatinine, Ser: 10.2 mg/dL — ABNORMAL HIGH (ref 0.61–1.24)
GFR calc Af Amer: 5 mL/min — ABNORMAL LOW (ref >60)
GFR calc non Af Amer: 4 mL/min — ABNORMAL LOW (ref >60)
GLUCOSE: 466 mg/dL — AB (ref 65–99)
POTASSIUM: 3.9 mmol/L (ref 3.5–5.1)
Phosphorus: 9.1 mg/dL — ABNORMAL HIGH (ref 2.5–4.6)
Sodium: 129 mmol/L — ABNORMAL LOW (ref 135–145)

## 2015-09-23 LAB — GLUCOSE, CAPILLARY
GLUCOSE-CAPILLARY: 488 mg/dL — AB (ref 65–99)
GLUCOSE-CAPILLARY: 492 mg/dL — AB (ref 65–99)
GLUCOSE-CAPILLARY: 400 mg/dL — AB (ref 65–99)
Glucose-Capillary: 520 mg/dL — ABNORMAL HIGH (ref 65–99)
Glucose-Capillary: 590 mg/dL (ref 65–99)
Glucose-Capillary: 346 mg/dL — ABNORMAL HIGH (ref 65–99)

## 2015-09-23 LAB — CBC
HEMATOCRIT: 28.3 % — AB (ref 39.0–52.0)
Hemoglobin: 9.1 g/dL — ABNORMAL LOW (ref 13.0–17.0)
MCH: 29 pg (ref 26.0–34.0)
MCHC: 32.2 g/dL (ref 30.0–36.0)
MCV: 90.1 fL (ref 78.0–100.0)
Platelets: 204 10*3/uL (ref 150–400)
RBC: 3.14 MIL/uL — ABNORMAL LOW (ref 4.22–5.81)
RDW: 13 % (ref 11.5–15.5)
WBC: 16.7 10*3/uL — ABNORMAL HIGH (ref 4.0–10.5)

## 2015-09-23 LAB — TROPONIN I: TROPONIN I: 0.91 ng/mL — AB (ref <0.031)

## 2015-09-23 LAB — SJOGRENS SYNDROME-A EXTRACTABLE NUCLEAR ANTIBODY

## 2015-09-23 LAB — C3 COMPLEMENT: C3 COMPLEMENT: 136 mg/dL (ref 82–167)

## 2015-09-23 LAB — COMPLEMENT, TOTAL: Compl, Total (CH50): >60 U/mL — ABNORMAL HIGH (ref 42–60)

## 2015-09-23 LAB — C4 COMPLEMENT: Complement C4, Body Fluid: 23 mg/dL (ref 14–44)

## 2015-09-23 MED ORDER — INSULIN NPH (HUMAN) (ISOPHANE) 100 UNIT/ML ~~LOC~~ SUSP
35.0000 [IU] | Freq: Two times a day (BID) | SUBCUTANEOUS | Status: DC
  Administered 2015-09-23: 35 [IU] via SUBCUTANEOUS
  Filled 2015-09-23 (×2): qty 10

## 2015-09-23 MED ORDER — INSULIN NPH (HUMAN) (ISOPHANE) 100 UNIT/ML ~~LOC~~ SUSP
60.0000 [IU] | Freq: Two times a day (BID) | SUBCUTANEOUS | Status: DC
  Administered 2015-09-23: 60 [IU] via SUBCUTANEOUS
  Filled 2015-09-23: qty 10

## 2015-09-23 MED ORDER — INSULIN NPH (HUMAN) (ISOPHANE) 100 UNIT/ML ~~LOC~~ SUSP: 60.0000 [IU] | Freq: Two times a day (BID) | SUBCUTANEOUS | Status: DC

## 2015-09-23 MED ORDER — INSULIN ASPART 100 UNIT/ML ~~LOC~~ SOLN
0.0000 [IU] | Freq: Three times a day (TID) | SUBCUTANEOUS | Status: DC
  Administered 2015-09-23 (×3): 15 [IU] via SUBCUTANEOUS
  Administered 2015-09-24: 8 [IU] via SUBCUTANEOUS
  Administered 2015-09-24 (×2): 11 [IU] via SUBCUTANEOUS

## 2015-09-23 MED ORDER — INSULIN NPH (HUMAN) (ISOPHANE) 100 UNIT/ML ~~LOC~~ SUSP
50.0000 [IU] | Freq: Two times a day (BID) | SUBCUTANEOUS | Status: DC
  Filled 2015-09-23: qty 10

## 2015-09-23 NOTE — Progress Notes (Signed)
[facility] ROUNDING NOTE   Subjective:   Interval History:  No complaints this morning  Hands seem stronger but has considerable lower extremity swelling   Objective:  Vital signs in last 24 hours:  Temp:  [97.4 F (36.3 C)-98.5 F (36.9 C)] 97.7 F (36.5 C) (06/14 0732) Pulse Rate:  [58-86] 81 (06/14 0732) Resp:  [17-18] 18 (06/14 0732) BP: (117-141)/(45-62) 141/62 mmHg (06/14 0732) SpO2:  [92 %-100 %] 100 % (06/14 0732) Weight:  [96.389 kg (212 lb 8 oz)] 96.389 kg (212 lb 8 oz) (06/13 2000)  Weight change: 5.216 kg (11 lb 8 oz) Filed Weights   09/21/15 1824 09/22/15 0659 09/22/15 2000  Weight: 91.173 kg (201 lb) 92.715 kg (204 lb 6.4 oz) 96.389 kg (212 lb 8 oz)    Intake/Output: I/O last 3 completed shifts: In: 2588.8 [P.O.:120; I.V.:2468.8] Out: -    Intake/Output this shift:  Total I/O In: 240 [P.O.:240] Out: -   Gen alert, no distress, retching No rash, cyanosis or gangrene Sclera anicteric, throat clear  JVP 12 -15 cm Chest clear bilat RRR soft sem no RG Abd soft ntnd +bs LLQ PD exit site is clean and dry, obese GU normal male  MS mild-mod swelling both hands and feet, R> L, tender to ROM bilat wrists and fingers and ankles Ext  2 +edema / no wounds or ulcers Neuro is alert, Ox 3 , nf LUA AVF +bruit   Basic Metabolic Panel:  Recent Labs Lab 09/21/15 1100 09/21/15 2116 09/22/15 0728 09/23/15 0432  NA 132*  --  132* 129*  K 3.4*  --  4.2 3.9  CL 95*  --  95* 92*  CO2 24  --  22 21*  GLUCOSE 159*  --  141* 466*  BUN 54*  --  61* 67*  CREATININE 8.86* 9.58* 10.33* 10.20*  CALCIUM 7.9* 7.5* 7.3* 7.2*  MG  --  1.4*  --   --   PHOS  --  9.1*  --  9.1*    Liver Function Tests:  Recent Labs Lab 09/21/15 1100 09/22/15 0728 09/23/15 0432  AST 22 21  --   ALT 19 16*  --   ALKPHOS 69 53  --   BILITOT 0.5 0.4  --   PROT 6.7 5.2*  --   ALBUMIN 2.7* 1.9* 1.8*   No results for input(s): LIPASE, AMYLASE in the last 168  hours. No results for input(s): AMMONIA in the last 168 hours.  CBC:  Recent Labs Lab 09/21/15 1100 09/21/15 2116 09/22/15 0728 09/23/15 0432  WBC 16.7* 19.1* 18.4* 16.7*  NEUTROABS 13.6*  --   --   --   HGB 11.0* 10.7* 9.2* 9.1*  HCT 31.2* 30.4* 27.5* 28.3*  MCV 89.1 89.7 89.6 90.1  PLT 247 223 197 204    Cardiac Enzymes:  Recent Labs Lab 09/22/15 1538 09/22/15 2050 09/23/15 0432  TROPONINI 1.42* 0.97* 0.91*    BNP: Invalid input(s): POCBNP  CBG:  Recent Labs Lab 09/22/15 1151 09/22/15 1635 09/22/15 2118 09/23/15 0728 09/23/15 0911  GLUCAP 189* 367* 435* 520* 590*    Microbiology: Results for orders placed or performed during the hospital encounter of 09/21/15  Urine culture     Status: Abnormal   Collection Time: 09/21/15 12:18 PM  Result Value Ref Range Status   Specimen Description URINE, CLEAN CATCH  Final   Special Requests NONE  Final   Culture MULTIPLE SPECIES PRESENT, SUGGEST RECOLLECTION (A)  Final   Report Status  09/22/2015 FINAL  Final  Blood culture (routine x 2)     Status: None (Preliminary result)   Collection Time: 09/21/15  1:05 PM  Result Value Ref Range Status   Specimen Description BLOOD RIGHT ANTECUBITAL DRAWN BY RN  Final   Special Requests   Final    BOTTLES DRAWN AEROBIC AND ANAEROBIC AEB=8CC ANA=6CC   Culture NO GROWTH < 24 HOURS  Final   Report Status PENDING  Incomplete  Blood culture (routine x 2)     Status: None (Preliminary result)   Collection Time: 09/21/15  1:14 PM  Result Value Ref Range Status   Specimen Description BLOOD RIGHT HAND  Final   Special Requests BOTTLES DRAWN AEROBIC ONLY 8CC  Final   Culture NO GROWTH < 24 HOURS  Final   Report Status PENDING  Incomplete  Body fluid culture     Status: None (Preliminary result)   Collection Time: 09/21/15  3:15 PM  Result Value Ref Range Status   Specimen Description PERITONEAL  Final   Special Requests NONE  Final   Gram Stain   Final    CYTOSPIN SMEAR RARE  WBC SEEN NO ORGANISMS SEEN Performed at [facility]    Culture   Final    NO GROWTH < 24 HOURS Performed at [facility]    Report Status PENDING  Incomplete    Coagulation Studies:  Recent Labs  09/21/15 1100  LABPROT 15.5*  INR 1.22    Urinalysis:  Recent Labs  09/21/15 1218  COLORURINE YELLOW  LABSPEC 1.020  PHURINE 5.5  GLUCOSEU 250*  HGBUR MODERATE*  BILIRUBINUR NEGATIVE  KETONESUR NEGATIVE  PROTEINUR >300*  NITRITE NEGATIVE  LEUKOCYTESUR NEGATIVE      Imaging: Dg Chest 2 View  09/21/2015  CLINICAL DATA:  Status post coronary artery stent placement 09/18/2015. Generalized swelling. Bilateral lower extremity pain. Initial encounter. EXAM: CHEST  2 VIEW COMPARISON:  None. FINDINGS: The patient is status post CABG. The lungs are clear. Heart size is upper normal. No pneumothorax or pleural effusion. IMPRESSION: No acute disease. Electronically Signed   By: Drusilla Kanner M.D.   On: 09/21/2015 12:32   US Venous Img Upper Uni Right  09/21/2015  CLINICAL DATA:  Coronary stent placement on 09/18/2015 with right upper extremity pain edema and shortness of breath. Evaluate for DVT. History of diabetes and smoking. EXAM: RIGHT UPPER EXTREMITY VENOUS DOPPLER ULTRASOUND TECHNIQUE: Gray-scale sonography with graded compression, as well as color Doppler and duplex ultrasound were performed to evaluate the upper extremity deep venous system from the level of the subclavian vein and including the jugular, axillary, basilic, radial, ulnar and upper cephalic vein. Spectral Doppler was utilized to evaluate flow at rest and with distal augmentation maneuvers. COMPARISON:  None. FINDINGS: Contralateral Subclavian Vein: Respiratory phasicity is normal and symmetric with the symptomatic side. No evidence of thrombus. Normal compressibility. Internal Jugular Vein: No evidence of thrombus. Normal compressibility, respiratory phasicity and response to augmentation. Subclavian  Vein: No evidence of thrombus. Normal compressibility, respiratory phasicity and response to augmentation. Axillary Vein: No evidence of thrombus. Normal compressibility, respiratory phasicity and response to augmentation. Cephalic Vein: No evidence of thrombus. Normal compressibility, respiratory phasicity and response to augmentation. Basilic Vein: No evidence of thrombus. Normal compressibility, respiratory phasicity and response to augmentation. Brachial Veins: No evidence of thrombus. Normal compressibility, respiratory phasicity and response to augmentation. Radial Veins: No evidence of thrombus. Normal compressibility, respiratory phasicity and response to augmentation. Ulnar Veins: No evidence of thrombus.  Normal compressibility, respiratory phasicity and response to augmentation. Venous Reflux:  None visualized. Other Findings:  None visualized. IMPRESSION: No evidence of DVT within the right upper extremity. Electronically Signed   By: Simonne ComeJohn  Watts M.D.   On: 09/21/2015 14:01   Dg Abd 2 Views  09/21/2015  CLINICAL DATA:  Decreased urine output. EXAM: ABDOMEN - 2 VIEW COMPARISON:  None. FINDINGS: Upright film shows no evidence for intraperitoneal free air. Diffuse gaseous distention of small bowel is evident with nondilated colon containing stool and possibly oral contrast material. Peritoneal dialysis catheter seen coiled in the right lower quadrant the abdomen. Multiple wires from telemetry device overlying the abdomen. IMPRESSION: Diffuse gaseous bowel distention. Imaging features do not suggest a frank, overt small bowel obstruction, but could be compatible with ileus. Electronically Signed   By: Kennith CenterEric  Mansell M.D.   On: 09/21/2015 12:30     Medications:   . sodium chloride 1,000 mL (09/23/15 0559)   . aspirin EC  81 mg Oral Daily  . atorvastatin  80 mg Oral q1800  . carvedilol  12.5 mg Oral BID WC  . dialysis solution 2.5% low-MG/low-CA   Intraperitoneal Q24H  . dialysis solution 2.5%  low-MG/low-CA   Intraperitoneal Q24H  . docusate sodium  100 mg Oral Daily  . gentamicin cream  1 application Topical Daily  . heparin  5,000 Units Subcutaneous Q8H  . insulin aspart  0-15 Units Subcutaneous TID WC  . insulin aspart  0-5 Units Subcutaneous QHS  . insulin glargine  20 Units Subcutaneous QHS  . insulin NPH Human  35 Units Subcutaneous BID AC & HS  . isosorbide mononitrate  120 mg Oral Daily  . piperacillin-tazobactam (ZOSYN)  IV  2.25 g Intravenous Q8H  . predniSONE  60 mg Oral Q breakfast  . sevelamer carbonate  800 mg Oral TID WC  . sodium chloride flush  3 mL Intravenous Q12H  . ticagrelor  90 mg Oral BID  . Vitamin D (Ergocalciferol)  50,000 Units Oral Q7 days   heparin, heparin, ondansetron **OR** ondansetron (ZOFRAN) IV, traMADol  Assessment/ Plan:  1 Fever/ polyarthritis - agree gout, also consider autoimmune with hx of Sjogren's. Would avoid colchicine for this as he is ESRD and dosing is difficult, would recommend NSAID's and/or steroids instead, have d/w primary.  2 ESRD on PD 3 N/V - no evidence of peritonitis 4 DM per prim 5 Vol is euvolemic on exam 6 CAD prior CABG, recent cor stent last week in [place]    LOS: 2 Stephone Gum W @TODAY @10 :02 AM

## 2015-09-23 NOTE — Progress Notes (Addendum)
PROGRESS NOTE                                                                                                                                                                                                             Patient Demographics:    [name], is a 75 y.o. male, DOB - January 10, 1941, WGN:[id number]  Admit date - 09/21/2015   Admitting Physician Houston SirenPeter Le, MD  Outpatient Primary MD for the patient is Pcp Not In System  LOS - 2  Outpatient Specialists: Renal cardiology  Chief Complaint  Patient presents with  . Joint Swelling       Brief Narrative   75 year old male with history of uncontrolled diabetes mellitus, ESRD on peritoneal dialysis, COPD, CAD status post CABG with recent STE MI with cardiac cath done and stent placed and discharged on dual antiplatelet therapy from [place] on 09/19/15, history of CHF, prior CVA, history of gout presented to the ED with diffuse joint pains with swelling, subjective fevers. Patient reports having joint pains in the past related to gout but was usually limited to MTP. In the ED patient had WBC of 16 K, creatinine of 8.8 And potassium of 3.4. Chest x-ray was unremarkable. UA and LFTs were normal. Given concern for sepsis patient was started on empiric vancomycin and Zosyn. Given inability to perform peritoneal dialysis at [facility].   Subjective:   Patient reports improvement in his joint pains with good range of motion. CBG elevated greater than 520   Assessment  & Plan :   Principal problem SIRS with polyarthritis Secondary to acute polyarticular gouty arthritis. Responding well to oral prednisone. Continue pain control with when necessary tramadol. Peritoneal fluid fluid negative for SBP. Uric acid level of 9. Avoiding colchicine and NSAIDs. PT evaluation.   Active Problems: Uncontrolled type 2 diabetes mellitus Patient is on NPH  50 units twice a day at home and reports CBG mostly in the range of 250s. Also he is on prednisone. Was getting low dose Lantus while in the hospital as he was only on clear liquid. CBG has persistently been in the 400s-500s since this morning. Receiving meal coverage. I have changed his insulin to home dose and will titrate further. A1c of 9.4.    Elevated troponin level No chest pain symptoms or EKG changes.  Possibly resolving from recent NSTEMI. Troponin trending down. No further recommendations from cardiology.  Ileus (HCC) Has mild abdominal distention, nontender. Advance diet to full liquid (carb mortified). Zofran when necessary for nausea.    ESRD on peritoneal dialysis [facility]) Renal following.  CAD with recent NSTEMI Status post stenting and discharged on DAPT with aspirin and brillinta which is continued. Continue statin and beta blocker.    CVA (cerebral infarction) Continue aspirin and statin.     Acute respiratory failure with hypoxia (HCC) Resolved. Likely due to pain and abdominal distention.  Hyponatremia Adjust with peritoneal dialysis  Sjogren's syndrome Follows with outpatient rheumatology  Code Status : full code  Family Communication  : Wife at bedside  Disposition Plan  : Home once symptoms improve, better CBG control,  possibly in the next 24-48 hours  Barriers For Discharge : Active symptoms, hyperglycemia  Consults  :  Renal  Procedures  : Diagnostic paracentesis  DVT Prophylaxis  :  Heparin  Lab Results  Component Value Date   PLT 204 09/23/2015    Antibiotics  :    Anti-infectives    Start     Dose/Rate Route Frequency Ordered Stop   09/21/15 2200  piperacillin-tazobactam (ZOSYN) IVPB 2.25 g     2.25 g 100 mL/hr over 30 Minutes Intravenous Every 8 hours 09/21/15 2036     09/21/15 1330  piperacillin-tazobactam (ZOSYN) IVPB 3.375 g     3.375 g 100 mL/hr over 30 Minutes Intravenous  Once 09/21/15 1325 09/21/15 1428        Objective:    Filed Vitals:   09/22/15 1615 09/22/15 2000 09/23/15 0459 09/23/15 0732  BP: 125/56 117/52 121/57 141/62  Pulse: 58 86 83 81  Temp: 97.9 F (36.6 C) 98.5 F (36.9 C) 97.4 F (36.3 C) 97.7 F (36.5 C)  TempSrc: Oral Oral Oral Oral  Resp: 17 17 17 18   Height:  5\' 9"  (1.753 m)    Weight:  96.389 kg (212 lb 8 oz)    SpO2: 97% 98% 100% 100%    Wt Readings from Last 3 Encounters:  09/22/15 96.389 kg (212 lb 8 oz)     Intake/Output Summary (Last 24 hours) at 09/23/15 1620 Last data filed at 09/23/15 0924  Gross per 24 hour  Intake  81191 ml  Output  13932 ml  Net   -704 ml     Physical Exam  Gen: not in distress HEENT: , moist mucosa, supple neck Chest: clear b/l, no added sounds CVS: N S1&S2, no murmurs, rubs or gallop GI: soft, NT, Mild distention, bowel sounds present Musculoskeletal: warm, no edema, improved ROM of ankle and knees. Swelling over left wrist and elbow improved with minimal tenderness  CNS: Oriented, nonfocal    Data Review:    CBC  Recent Labs Lab 09/21/15 1100 09/21/15 2116 09/22/15 0728 09/23/15 0432  WBC 16.7* 19.1* 18.4* 16.7*  HGB 11.0* 10.7* 9.2* 9.1*  HCT 31.2* 30.4* 27.5* 28.3*  PLT 247 223 197 204  MCV 89.1 89.7 89.6 90.1  MCH 31.4 31.6 30.0 29.0  MCHC 35.3 35.2 33.5 32.2  RDW 12.9 13.0 13.3 13.0  LYMPHSABS 1.0  --   --   --   MONOABS 2.1*  --   --   --   EOSABS 0.0  --   --   --   BASOSABS 0.0  --   --   --     Chemistries   Recent Labs Lab 09/21/15 1100 09/21/15 2116  09/22/15 0728 09/23/15 0432  NA 132*  --  132* 129*  K 3.4*  --  4.2 3.9  CL 95*  --  95* 92*  CO2 24  --  22 21*  GLUCOSE 159*  --  141* 466*  BUN 54*  --  61* 67*  CREATININE 8.86* 9.58* 10.33* 10.20*  CALCIUM 7.9* 7.5* 7.3* 7.2*  MG  --  1.4*  --   --   AST 22  --  21  --   ALT 19  --  16*  --   ALKPHOS 69  --  53  --   BILITOT 0.5  --  0.4  --     ------------------------------------------------------------------------------------------------------------------ No results for input(s): CHOL, HDL, LDLCALC, TRIG, CHOLHDL, LDLDIRECT in the last 72 hours.  Lab Results  Component Value Date   HGBA1C 9.4* 09/22/2015   ------------------------------------------------------------------------------------------------------------------  Recent Labs  09/21/15 1542  TSH 3.758   ------------------------------------------------------------------------------------------------------------------ No results for input(s): VITAMINB12, FOLATE, FERRITIN, TIBC, IRON, RETICCTPCT in the last 72 hours.  Coagulation profile  Recent Labs Lab 09/21/15 1100  INR 1.22    No results for input(s): DDIMER in the last 72 hours.  Cardiac Enzymes  Recent Labs Lab 09/22/15 1538 09/22/15 2050 09/23/15 0432  TROPONINI 1.42* 0.97* 0.91*   ------------------------------------------------------------------------------------------------------------------    Component Value Date/Time   BNP 821.0* 09/21/2015 1100    Inpatient Medications  Scheduled Meds: . aspirin EC  81 mg Oral Daily  . atorvastatin  80 mg Oral q1800  . carvedilol  12.5 mg Oral BID WC  . dialysis solution 2.5% low-MG/low-CA   Intraperitoneal Q24H  . docusate sodium  100 mg Oral Daily  . gentamicin cream  1 application Topical Daily  . heparin  5,000 Units Subcutaneous Q8H  . insulin aspart  0-15 Units Subcutaneous TID WC  . insulin aspart  0-5 Units Subcutaneous QHS  . insulin glargine  20 Units Subcutaneous QHS  . insulin NPH Human  50 Units Subcutaneous BID AC & HS  . isosorbide mononitrate  120 mg Oral Daily  . piperacillin-tazobactam (ZOSYN)  IV  2.25 g Intravenous Q8H  . predniSONE  60 mg Oral Q breakfast  . sevelamer carbonate  800 mg Oral TID WC  . sodium chloride flush  3 mL Intravenous Q12H  . ticagrelor  90 mg Oral BID  . Vitamin D (Ergocalciferol)  50,000 Units  Oral Q7 days   Continuous Infusions: . sodium chloride 1,000 mL (09/23/15 1543)   PRN Meds:.heparin, ondansetron **OR** ondansetron (ZOFRAN) IV, traMADol  Micro Results Recent Results (from the past 240 hour(s))  Urine culture     Status: Abnormal   Collection Time: 09/21/15 12:18 PM  Result Value Ref Range Status   Specimen Description URINE, CLEAN CATCH  Final   Special Requests NONE  Final   Culture MULTIPLE SPECIES PRESENT, SUGGEST RECOLLECTION (A)  Final   Report Status 09/22/2015 FINAL  Final  Blood culture (routine x 2)     Status: None (Preliminary result)   Collection Time: 09/21/15  1:05 PM  Result Value Ref Range Status   Specimen Description BLOOD RIGHT ANTECUBITAL DRAWN BY RN  Final   Special Requests   Final    BOTTLES DRAWN AEROBIC AND ANAEROBIC AEB=8CC ANA=6CC   Culture NO GROWTH 2 DAYS  Final   Report Status PENDING  Incomplete  Blood culture (routine x 2)     Status: None (Preliminary result)   Collection Time: 09/21/15  1:14 PM  Result Value  Ref Range Status   Specimen Description BLOOD RIGHT HAND  Final   Special Requests BOTTLES DRAWN AEROBIC ONLY 8CC  Final   Culture NO GROWTH 2 DAYS  Final   Report Status PENDING  Incomplete  Body fluid culture     Status: None (Preliminary result)   Collection Time: 09/21/15  3:15 PM  Result Value Ref Range Status   Specimen Description PERITONEAL  Final   Special Requests NONE  Final   Gram Stain   Final    CYTOSPIN SMEAR RARE WBC SEEN NO ORGANISMS SEEN Performed at [facility]    Culture   Final    NO GROWTH 2 DAYS Performed at [facility]    Report Status PENDING  Incomplete    Radiology Reports Dg Chest 2 View  09/21/2015  CLINICAL DATA:  Status post coronary artery stent placement 09/18/2015. Generalized swelling. Bilateral lower extremity pain. Initial encounter. EXAM: CHEST  2 VIEW COMPARISON:  None. FINDINGS: The patient is status post CABG. The lungs are clear. Heart size is upper  normal. No pneumothorax or pleural effusion. IMPRESSION: No acute disease. Electronically Signed   By: Drusilla Kanner M.D.   On: 09/21/2015 12:32   US Venous Img Upper Uni Right  09/21/2015  CLINICAL DATA:  Coronary stent placement on 09/18/2015 with right upper extremity pain edema and shortness of breath. Evaluate for DVT. History of diabetes and smoking. EXAM: RIGHT UPPER EXTREMITY VENOUS DOPPLER ULTRASOUND TECHNIQUE: Gray-scale sonography with graded compression, as well as color Doppler and duplex ultrasound were performed to evaluate the upper extremity deep venous system from the level of the subclavian vein and including the jugular, axillary, basilic, radial, ulnar and upper cephalic vein. Spectral Doppler was utilized to evaluate flow at rest and with distal augmentation maneuvers. COMPARISON:  None. FINDINGS: Contralateral Subclavian Vein: Respiratory phasicity is normal and symmetric with the symptomatic side. No evidence of thrombus. Normal compressibility. Internal Jugular Vein: No evidence of thrombus. Normal compressibility, respiratory phasicity and response to augmentation. Subclavian Vein: No evidence of thrombus. Normal compressibility, respiratory phasicity and response to augmentation. Axillary Vein: No evidence of thrombus. Normal compressibility, respiratory phasicity and response to augmentation. Cephalic Vein: No evidence of thrombus. Normal compressibility, respiratory phasicity and response to augmentation. Basilic Vein: No evidence of thrombus. Normal compressibility, respiratory phasicity and response to augmentation. Brachial Veins: No evidence of thrombus. Normal compressibility, respiratory phasicity and response to augmentation. Radial Veins: No evidence of thrombus. Normal compressibility, respiratory phasicity and response to augmentation. Ulnar Veins: No evidence of thrombus. Normal compressibility, respiratory phasicity and response to augmentation. Venous Reflux:  None  visualized. Other Findings:  None visualized. IMPRESSION: No evidence of DVT within the right upper extremity. Electronically Signed   By: Simonne Come M.D.   On: 09/21/2015 14:01   Dg Abd 2 Views  09/21/2015  CLINICAL DATA:  Decreased urine output. EXAM: ABDOMEN - 2 VIEW COMPARISON:  None. FINDINGS: Upright film shows no evidence for intraperitoneal free air. Diffuse gaseous distention of small bowel is evident with nondilated colon containing stool and possibly oral contrast material. Peritoneal dialysis catheter seen coiled in the right lower quadrant the abdomen. Multiple wires from telemetry device overlying the abdomen. IMPRESSION: Diffuse gaseous bowel distention. Imaging features do not suggest a frank, overt small bowel obstruction, but could be compatible with ileus. Electronically Signed   By: Kennith Center M.D.   On: 09/21/2015 12:30    Time Spent in minutes  35   Frazer Rainville M.D  on 09/23/2015 at 4:20 PM  Between 7am to 7pm - Pager - [phone]  After 7pm go to [email or web address] - password [facility]  [facility] -  Office  [phone]

## 2015-09-23 NOTE — Progress Notes (Signed)
0730 Pts cbg was 520 notified MD. New orders received. At 0911 pts cbg was 590. Notified MD and orders received to recheck. At 1114 pts cbg was 488. Notified MD and to recheck at evening meal and notify. Mariann BarterKellie Keiaira Donlan RN

## 2015-09-23 NOTE — Progress Notes (Signed)
Complete cardiology note was done yesterday. The plan was no further cardiac workup if troponins continue to trend downward. Labs show that the troponins continue to trend downward. Therefore no further cardiology workup.   Jerral BonitoJeff Jeyson Deshotel, MD

## 2015-09-24 ENCOUNTER — Inpatient Hospital Stay (HOSPITAL_COMMUNITY): Payer: Medicare Other

## 2015-09-24 LAB — BASIC METABOLIC PANEL
Anion gap: 17 — ABNORMAL HIGH (ref 5–15)
BUN: 66 mg/dL — ABNORMAL HIGH (ref 6–20)
CALCIUM: 7.7 mg/dL — AB (ref 8.9–10.3)
CO2: 20 mmol/L — AB (ref 22–32)
CREATININE: 9.67 mg/dL — AB (ref 0.61–1.24)
Chloride: 93 mmol/L — ABNORMAL LOW (ref 101–111)
GFR calc Af Amer: 5 mL/min — ABNORMAL LOW (ref >60)
GFR calc non Af Amer: 5 mL/min — ABNORMAL LOW (ref >60)
GLUCOSE: 281 mg/dL — AB (ref 65–99)
Potassium: 3.8 mmol/L (ref 3.5–5.1)
Sodium: 130 mmol/L — ABNORMAL LOW (ref 135–145)

## 2015-09-24 LAB — CBC
HEMATOCRIT: 27.3 % — AB (ref 39.0–52.0)
Hemoglobin: 9.1 g/dL — ABNORMAL LOW (ref 13.0–17.0)
MCH: 29.4 pg (ref 26.0–34.0)
MCHC: 33.3 g/dL (ref 30.0–36.0)
MCV: 88.1 fL (ref 78.0–100.0)
Platelets: 244 10*3/uL (ref 150–400)
RBC: 3.1 MIL/uL — ABNORMAL LOW (ref 4.22–5.81)
RDW: 13.1 % (ref 11.5–15.5)
WBC: 18.1 10*3/uL — ABNORMAL HIGH (ref 4.0–10.5)

## 2015-09-24 LAB — GLUCOSE, CAPILLARY
GLUCOSE-CAPILLARY: 256 mg/dL — AB (ref 65–99)
GLUCOSE-CAPILLARY: 311 mg/dL — AB (ref 65–99)
Glucose-Capillary: 307 mg/dL — ABNORMAL HIGH (ref 65–99)
Glucose-Capillary: 342 mg/dL — ABNORMAL HIGH (ref 65–99)

## 2015-09-24 MED ORDER — INSULIN NPH (HUMAN) (ISOPHANE) 100 UNIT/ML ~~LOC~~ SUSP
70.0000 [IU] | Freq: Two times a day (BID) | SUBCUTANEOUS | Status: DC
  Filled 2015-09-24: qty 10

## 2015-09-24 MED ORDER — INSULIN NPH (HUMAN) (ISOPHANE) 100 UNIT/ML ~~LOC~~ SUSP
70.0000 [IU] | Freq: Two times a day (BID) | SUBCUTANEOUS | Status: DC
  Administered 2015-09-24 (×2): 70 [IU] via SUBCUTANEOUS

## 2015-09-24 MED ORDER — PREDNISONE 20 MG PO TABS
40.0000 mg | ORAL_TABLET | Freq: Every day | ORAL | Status: DC
  Administered 2015-09-25 – 2015-09-26 (×2): 40 mg via ORAL
  Filled 2015-09-24 (×3): qty 2

## 2015-09-24 NOTE — Progress Notes (Signed)
Subjective:  Sugar high overnight - UF with PD around 1.5 liters - hands much better  Objective Vital signs in last 24 hours: Filed Vitals:   09/23/15 1925 09/23/15 2230 09/24/15 0554 09/24/15 0900  BP: 115/66  146/62 146/64  Pulse: 81  76 78  Temp: 98.2 F (36.8 C)  97.7 F (36.5 C) 97.5 F (36.4 C)  TempSrc: Oral  Oral Oral  Resp: Height:      Weight: 95.8 kg (211 lb 3.2 oz) 95.8 kg (211 lb 3.2 oz)    SpO2: 98%  98% 98%   Weight change: -0.59 kg (-1 lb 4.8 oz)  Intake/Output Summary (Last 24 hours) at 09/24/15 1127 Last data filed at 09/24/15 1022  Gross per 24 hour  Intake    480 ml  Output    150 ml  Net    330 ml    Assessment/ Plan: Pt is a 75 y.o. yo male with ESRD on PD who was admitted on 09/21/2015 with polyarthritis also with recent CAD with treatment  Assessment/Plan: 1. Polyarthirits- basically just treated with prednisone with improvement- elevated uric acid would likely benefit from urate lowering agent in OP setting  2. ESRD - have been continuing CCPD with home regimen- is volume overloaded- see plan below 3. Anemia- hgb stable at 9- not on specific treatment here - anticipate will be discharged home and can be treated per protocol as OP 4. Secondary hyperparathyroidism- continue home renvela 5. HTN/volume- is overloaded- patient aware to use 4.25% fluid to achieve more UF- if stays tonight will do - but if goes home has supplies to do it at home as well  6. Dispo- stable for discharge from renal standpoint - sugar high due to steroids and higher sugar content of PD fluid   [name] A    Labs: Basic Metabolic Panel:  Recent Labs Lab 09/21/15 2116  09/23/15 0432 09/23/15 1822 09/24/15 0622  NA  --   < > 129* 128* 130*  K  --   < > 3.9 3.8 3.8  CL  --   < > 92* 92* 93*  CO2  --   < > 21* 19* 20*  GLUCOSE  --   < > 466* 361* 281*  BUN  --   < > 67* 69* 66*  CREATININE 9.58*  < > 10.20* 10.31* 9.67*  CALCIUM 7.5*  < > 7.2* 7.4*  7.7*  PHOS 9.1*  --  9.1*  --   --   < > = values in this interval not displayed. Liver Function Tests:  Recent Labs Lab 09/21/15 1100 09/22/15 0728 09/23/15 0432  AST 22 21  --   ALT 19 16*  --   ALKPHOS 69 53  --   BILITOT 0.5 0.4  --   PROT 6.7 5.2*  --   ALBUMIN 2.7* 1.9* 1.8*   No results for input(s): LIPASE, AMYLASE in the last 168 hours. No results for input(s): AMMONIA in the last 168 hours. CBC:  Recent Labs Lab 09/21/15 1100 09/21/15 2116 09/22/15 0728 09/23/15 0432 09/24/15 0622  WBC 16.7* 19.1* 18.4* 16.7* 18.1*  NEUTROABS 13.6*  --   --   --   --   HGB 11.0* 10.7* 9.2* 9.1* 9.1*  HCT 31.2* 30.4* 27.5* 28.3* 27.3*  MCV 89.1 89.7 89.6 90.1 88.1  PLT 247 223 197 204 244   Cardiac Enzymes:  Recent Labs Lab 09/22/15 1538 09/22/15 2050 09/23/15 0432  TROPONINI 1.42*  0.97* 0.91*   CBG:  Recent Labs Lab 09/23/15 1114 09/23/15 1300 09/23/15 1716 09/23/15 1954 09/24/15 0755  GLUCAP 488* 492* 400* 346* 307*    Iron Studies: No results for input(s): IRON, TIBC, TRANSFERRIN, FERRITIN in the last 72 hours. Studies/Results: Dg Abd Portable 1v  09/24/2015  CLINICAL DATA:  Ileus, history hypertension, diabetes mellitus, coronary artery disease, CHF, COPD, end-stage renal disease on peritoneal dialysis EXAM: PORTABLE ABDOMEN - 1 VIEW COMPARISON:  Portable exam 0811 hours compared to 09/21/2015 FINDINGS: Scattered stool and gas throughout nondistended colon. Density at inferior pelvis centrally likely represents a small amount of excreted contrast material within urinary bladder. Nonobstructive bowel gas pattern. No bowel dilatation or bowel wall thickening. Bones demineralized. IMPRESSION: Nonspecific bowel gas pattern. Electronically Signed   By: Ulyses SouthwardMark  Boles M.D.   On: 09/24/2015 08:22   Medications: Infusions:    Scheduled Medications: . aspirin EC  81 mg Oral Daily  . atorvastatin  80 mg Oral q1800  . carvedilol  12.5 mg Oral BID WC  . dialysis  solution 2.5% low-MG/low-CA   Intraperitoneal Q24H  . docusate sodium  100 mg Oral Daily  . gentamicin cream  1 application Topical Daily  . heparin  5,000 Units Subcutaneous Q8H  . insulin aspart  0-15 Units Subcutaneous TID WC  . insulin aspart  0-5 Units Subcutaneous QHS  . insulin NPH Human  70 Units Subcutaneous BID AC & HS  . isosorbide mononitrate  120 mg Oral Daily  . predniSONE  60 mg Oral Q breakfast  . sevelamer carbonate  800 mg Oral TID WC  . sodium chloride flush  3 mL Intravenous Q12H  . ticagrelor  90 mg Oral BID  . Vitamin D (Ergocalciferol)  50,000 Units Oral Q7 days    have reviewed scheduled and prn medications.  Physical Exam: General: NAD Heart: RRR Lungs: mostly clear Abdomen: fluid in- non tender Extremities: pitting edema    09/24/2015,11:27 AM  LOS: 3 days

## 2015-09-24 NOTE — Care Management Important Message (Signed)
Important Message  Patient Details  Name: [name] KitchenJames D Althouse MRN: [id number] Date of Birth: [birth year]   Medicare Important Message Given:  Yes    Bernadette HoitShoffner, Loretto Belinsky Coleman 09/24/2015, 10:01 AM

## 2015-09-24 NOTE — Progress Notes (Signed)
PROGRESS NOTE                                                                                                                                                                                                             Patient Demographics:    [name], is a 75 y.o. male, DOB - 01-13-1941, WUJ:[id number]  Admit date - 09/21/2015   Admitting Physician Houston Siren, MD  Outpatient Primary MD for the patient is Pcp Not In System  LOS - 3  Outpatient Specialists: Renal cardiology  Chief Complaint  Patient presents with  . Joint Swelling       Brief Narrative   75 year old male with history of uncontrolled diabetes mellitus, ESRD on peritoneal dialysis, COPD, CAD status post CABG with recent STE MI with cardiac cath done and stent placed and discharged on dual antiplatelet therapy from [place] on 09/19/15, history of CHF, prior CVA, history of gout presented to the ED with diffuse joint pains with swelling, subjective fevers. Patient reports having joint pains in the past related to gout but was usually limited to MTP. In the ED patient had WBC of 16 K, creatinine of 8.8 And potassium of 3.4. Chest x-ray was unremarkable. UA and LFTs were normal. Given concern for sepsis patient was started on empiric vancomycin and Zosyn. Given inability to perform peritoneal dialysis at [facility] he was transferred to [facility].   Subjective:   Joint pain improves but has weakness and feels short of breath on getting up. cbg still in 300s to 400s   Assessment  & Plan :   Principal problem SIRS with polyarthritis Secondary to acute polyarticular gouty arthritis. Responding well to oral prednisone. Continue pain control with when necessary tramadol. Peritoneal fluid fluid negative for SBP. Uric acid level of 9. Avoiding colchicine and NSAIDs.  Reduce dose of prednisone and taper on d/c. Will need to start on allopurinol once acute  episode subsides. PT evaluation.   Active Problems: Uncontrolled type 2 diabetes mellitus Patient is on NPH 50 units twice a day at home and reports CBG mostly in the range of 250s. Also he is on prednisone and sugar content in dialysis fluid that's causing high blood glucose.. CBG in 500s, improved to 300s today. . A1c of 9.4. Increased NPH to 70 u bid. Reduce prednisone dose.    Elevated  troponin level No chest pain symptoms or EKG changes. Possibly resolving from recent NSTEMI. Troponin trending down. No further recommendations from cardiology.  Ileus (HCC) mild abdominal distention, nontender. Repeat xray shows nonobstructive bowel gas pattern. tolerating advanced diet.    ESRD on peritoneal dialysis [facility](HCC) Renal following.  CAD with recent NSTEMI Status post stenting and discharged on DAPT with aspirin and brillinta which is continued. Continue statin and beta blocker.    CVA (cerebral infarction) Continue aspirin and statin.     Acute respiratory failure with hypoxia (HCC) Resolved. Likely due to pain and abdominal distention.  Hyponatremia Adjust with peritoneal dialysis  Sjogren's syndrome Follows with outpatient rheumatology  Code Status : full code  Family Communication  : Wife at bedside  Disposition Plan  : Home once symptoms improve, better CBG control,  possibly in am  Barriers For Discharge : PT eval  hyperglycemia  Consults  :  Renal  Procedures  : Diagnostic paracentesis  DVT Prophylaxis  :  Heparin  Lab Results  Component Value Date   PLT 244 09/24/2015    Antibiotics  :    Anti-infectives    Start     Dose/Rate Route Frequency Ordered Stop   09/21/15 2200  piperacillin-tazobactam (ZOSYN) IVPB 2.25 g  Status:  Discontinued     2.25 g 100 mL/hr over 30 Minutes Intravenous Every 8 hours 09/21/15 2036 09/23/15 1625   09/21/15 1330  piperacillin-tazobactam (ZOSYN) IVPB 3.375 g     3.375 g 100 mL/hr over 30 Minutes Intravenous  Once 09/21/15  1325 09/21/15 1428        Objective:   Filed Vitals:   09/23/15 1925 09/23/15 2230 09/24/15 0554 09/24/15 0900  BP: 115/66  146/62 146/64  Pulse: 81  76 78  Temp: 98.2 F (36.8 C)  97.7 F (36.5 C) 97.5 F (36.4 C)  TempSrc: Oral  Oral Oral  Resp: 18  20 20   Height:      Weight: 95.8 kg (211 lb 3.2 oz) 95.8 kg (211 lb 3.2 oz)    SpO2: 98%  98% 98%    Wt Readings from Last 3 Encounters:  09/23/15 95.8 kg (211 lb 3.2 oz)     Intake/Output Summary (Last 24 hours) at 09/24/15 1615 Last data filed at 09/24/15 1543  Gross per 24 hour  Intake    600 ml  Output    250 ml  Net    350 ml     Physical Exam  Gen: not in distress HEENT: , moist mucosa, supple neck Chest: clear b/l, no added sounds CVS: N S1&S2, no murmurs, rubs or gallop GI: soft, NT, Mild distention, bowel sounds present Musculoskeletal: warm, no edema, improved ROM of ankle and knees. Swelling over left wrist and elbow improved   CNS: Oriented, nonfocal    Data Review:    CBC  Recent Labs Lab 09/21/15 1100 09/21/15 2116 09/22/15 0728 09/23/15 0432 09/24/15 0622  WBC 16.7* 19.1* 18.4* 16.7* 18.1*  HGB 11.0* 10.7* 9.2* 9.1* 9.1*  HCT 31.2* 30.4* 27.5* 28.3* 27.3*  PLT 247 223 197 204 244  MCV 89.1 89.7 89.6 90.1 88.1  MCH 31.4 31.6 30.0 29.0 29.4  MCHC 35.3 35.2 33.5 32.2 33.3  RDW 12.9 13.0 13.3 13.0 13.1  LYMPHSABS 1.0  --   --   --   --   MONOABS 2.1*  --   --   --   --   EOSABS 0.0  --   --   --   --  BASOSABS 0.0  --   --   --   --     Chemistries   Recent Labs Lab 09/21/15 1100 09/21/15 2116 09/22/15 0728 09/23/15 0432 09/23/15 1822 09/24/15 0622  NA 132*  --  132* 129* 128* 130*  K 3.4*  --  4.2 3.9 3.8 3.8  CL 95*  --  95* 92* 92* 93*  CO2 24  --  22 21* 19* 20*  GLUCOSE 159*  --  141* 466* 361* 281*  BUN 54*  --  61* 67* 69* 66*  CREATININE 8.86* 9.58* 10.33* 10.20* 10.31* 9.67*  CALCIUM 7.9* 7.5* 7.3* 7.2* 7.4* 7.7*  MG  --  1.4*  --   --   --   --   AST 22  --   21  --   --   --   ALT 19  --  16*  --   --   --   ALKPHOS 69  --  53  --   --   --   BILITOT 0.5  --  0.4  --   --   --    ------------------------------------------------------------------------------------------------------------------ No results for input(s): CHOL, HDL, LDLCALC, TRIG, CHOLHDL, LDLDIRECT in the last 72 hours.  Lab Results  Component Value Date   HGBA1C 9.4* 09/22/2015   ------------------------------------------------------------------------------------------------------------------ No results for input(s): TSH, T4TOTAL, T3FREE, THYROIDAB in the last 72 hours.  Invalid input(s): FREET3 ------------------------------------------------------------------------------------------------------------------ No results for input(s): VITAMINB12, FOLATE, FERRITIN, TIBC, IRON, RETICCTPCT in the last 72 hours.  Coagulation profile  Recent Labs Lab 09/21/15 1100  INR 1.22    No results for input(s): DDIMER in the last 72 hours.  Cardiac Enzymes  Recent Labs Lab 09/22/15 1538 09/22/15 2050 09/23/15 0432  TROPONINI 1.42* 0.97* 0.91*   ------------------------------------------------------------------------------------------------------------------    Component Value Date/Time   BNP 821.0* 09/21/2015 1100    Inpatient Medications  Scheduled Meds: . aspirin EC  81 mg Oral Daily  . atorvastatin  80 mg Oral q1800  . carvedilol  12.5 mg Oral BID WC  . dialysis solution 2.5% low-MG/low-CA   Intraperitoneal Q24H  . docusate sodium  100 mg Oral Daily  . gentamicin cream  1 application Topical Daily  . heparin  5,000 Units Subcutaneous Q8H  . insulin aspart  0-15 Units Subcutaneous TID WC  . insulin aspart  0-5 Units Subcutaneous QHS  . insulin NPH Human  70 Units Subcutaneous BID AC & HS  . isosorbide mononitrate  120 mg Oral Daily  . predniSONE  60 mg Oral Q breakfast  . sevelamer carbonate  800 mg Oral TID WC  . sodium chloride flush  3 mL Intravenous Q12H  .  ticagrelor  90 mg Oral BID  . Vitamin D (Ergocalciferol)  50,000 Units Oral Q7 days   Continuous Infusions:   PRN Meds:.heparin, ondansetron **OR** ondansetron (ZOFRAN) IV, traMADol  Micro Results Recent Results (from the past 240 hour(s))  Urine culture     Status: Abnormal   Collection Time: 09/21/15 12:18 PM  Result Value Ref Range Status   Specimen Description URINE, CLEAN CATCH  Final   Special Requests NONE  Final   Culture MULTIPLE SPECIES PRESENT, SUGGEST RECOLLECTION (A)  Final   Report Status 09/22/2015 FINAL  Final  Blood culture (routine x 2)     Status: None (Preliminary result)   Collection Time: 09/21/15  1:05 PM  Result Value Ref Range Status   Specimen Description BLOOD RIGHT ANTECUBITAL DRAWN BY RN  Final  Special Requests   Final    BOTTLES DRAWN AEROBIC AND ANAEROBIC AEB=8CC ANA=6CC   Culture NO GROWTH 3 DAYS  Final   Report Status PENDING  Incomplete  Blood culture (routine x 2)     Status: None (Preliminary result)   Collection Time: 09/21/15  1:14 PM  Result Value Ref Range Status   Specimen Description BLOOD RIGHT HAND  Final   Special Requests BOTTLES DRAWN AEROBIC ONLY 8CC  Final   Culture NO GROWTH 3 DAYS  Final   Report Status PENDING  Incomplete  Body fluid culture     Status: None (Preliminary result)   Collection Time: 09/21/15  3:15 PM  Result Value Ref Range Status   Specimen Description PERITONEAL  Final   Special Requests NONE  Final   Gram Stain   Final    CYTOSPIN SMEAR RARE WBC SEEN NO ORGANISMS SEEN Performed at [facility]    Culture   Final    NO GROWTH 3 DAYS Performed at [facility]    Report Status PENDING  Incomplete    Radiology Reports Dg Chest 2 View  09/21/2015  CLINICAL DATA:  Status post coronary artery stent placement 09/18/2015. Generalized swelling. Bilateral lower extremity pain. Initial encounter. EXAM: CHEST  2 VIEW COMPARISON:  None. FINDINGS: The patient is status post CABG. The lungs are  clear. Heart size is upper normal. No pneumothorax or pleural effusion. IMPRESSION: No acute disease. Electronically Signed   By: Drusilla Kanner M.D.   On: 09/21/2015 12:32   US Venous Img Upper Uni Right  09/21/2015  CLINICAL DATA:  Coronary stent placement on 09/18/2015 with right upper extremity pain edema and shortness of breath. Evaluate for DVT. History of diabetes and smoking. EXAM: RIGHT UPPER EXTREMITY VENOUS DOPPLER ULTRASOUND TECHNIQUE: Gray-scale sonography with graded compression, as well as color Doppler and duplex ultrasound were performed to evaluate the upper extremity deep venous system from the level of the subclavian vein and including the jugular, axillary, basilic, radial, ulnar and upper cephalic vein. Spectral Doppler was utilized to evaluate flow at rest and with distal augmentation maneuvers. COMPARISON:  None. FINDINGS: Contralateral Subclavian Vein: Respiratory phasicity is normal and symmetric with the symptomatic side. No evidence of thrombus. Normal compressibility. Internal Jugular Vein: No evidence of thrombus. Normal compressibility, respiratory phasicity and response to augmentation. Subclavian Vein: No evidence of thrombus. Normal compressibility, respiratory phasicity and response to augmentation. Axillary Vein: No evidence of thrombus. Normal compressibility, respiratory phasicity and response to augmentation. Cephalic Vein: No evidence of thrombus. Normal compressibility, respiratory phasicity and response to augmentation. Basilic Vein: No evidence of thrombus. Normal compressibility, respiratory phasicity and response to augmentation. Brachial Veins: No evidence of thrombus. Normal compressibility, respiratory phasicity and response to augmentation. Radial Veins: No evidence of thrombus. Normal compressibility, respiratory phasicity and response to augmentation. Ulnar Veins: No evidence of thrombus. Normal compressibility, respiratory phasicity and response to  augmentation. Venous Reflux:  None visualized. Other Findings:  None visualized. IMPRESSION: No evidence of DVT within the right upper extremity. Electronically Signed   By: Simonne Come M.D.   On: 09/21/2015 14:01   Dg Abd 2 Views  09/21/2015  CLINICAL DATA:  Decreased urine output. EXAM: ABDOMEN - 2 VIEW COMPARISON:  None. FINDINGS: Upright film shows no evidence for intraperitoneal free air. Diffuse gaseous distention of small bowel is evident with nondilated colon containing stool and possibly oral contrast material. Peritoneal dialysis catheter seen coiled in the right lower quadrant the abdomen. Multiple wires  from telemetry device overlying the abdomen. IMPRESSION: Diffuse gaseous bowel distention. Imaging features do not suggest a frank, overt small bowel obstruction, but could be compatible with ileus. Electronically Signed   By: Kennith Center M.D.   On: 09/21/2015 12:30   Dg Abd Portable 1v  09/24/2015  CLINICAL DATA:  Ileus, history hypertension, diabetes mellitus, coronary artery disease, CHF, COPD, end-stage renal disease on peritoneal dialysis EXAM: PORTABLE ABDOMEN - 1 VIEW COMPARISON:  Portable exam 0811 hours compared to 09/21/2015 FINDINGS: Scattered stool and gas throughout nondistended colon. Density at inferior pelvis centrally likely represents a small amount of excreted contrast material within urinary bladder. Nonobstructive bowel gas pattern. No bowel dilatation or bowel wall thickening. Bones demineralized. IMPRESSION: Nonspecific bowel gas pattern. Electronically Signed   By: Ulyses Southward M.D.   On: 09/24/2015 08:22    Time Spent in minutes 25   Eddie North M.D on 09/24/2015 at 4:15 PM  Between 7am to 7pm - Pager - [phone]  After 7pm go to [email or web address] - password [facility]  [facility] -  Office  [phone]

## 2015-09-24 NOTE — Evaluation (Signed)
Physical Therapy Evaluation Patient Details Name: [name] MRN: [id number] DOB: 1941/02/16 Today's Date: 09/24/2015   History of Present Illness  pt is a 75 y/o male with PMHx of DM ESRD on PD, COPD, Prior CVA, CAD s/p CABG and recent STEMI with cath and stenting admitted with joint aches, fever and malaise.  Work up for gouty arthritis  Clinical Impression  Pt is at or close to baseline functioning and should be safe at home with wife's assist for a few days.  Pt could benefit from a cardiac or pulmonary rehab once cleared and okay'd by MD.. There are no further acute PT needs.  Will sign off at this time.     Follow Up Recommendations No PT follow up;Supervision/Assistance - 24 hour;Supervision for mobility/OOB    Equipment Recommendations       Recommendations for Other Services       Precautions / Restrictions Precautions Precautions: Fall      Mobility  Bed Mobility Overal bed mobility: Modified Independent                Transfers Overall transfer level: Needs assistance   Transfers: Sit to/from Stand;Stand Pivot Transfers Sit to Stand: Supervision Stand pivot transfers: Supervision          Ambulation/Gait Ambulation/Gait assistance: Supervision Ambulation Distance (Feet): 170 Feet   Gait Pattern/deviations: Step-through pattern (mild drifting) Gait velocity: slower Gait velocity interpretation: Below normal speed for age/gender General Gait Details: generally steady with gait though some drifting  Stairs            Wheelchair Mobility    Modified Rankin (Stroke Patients Only)       Balance Overall balance assessment: Needs assistance Sitting-balance support: No upper extremity supported Sitting balance-Leahy Scale: Good     Standing balance support: No upper extremity supported Standing balance-Leahy Scale: Fair                               Pertinent Vitals/Pain Pain Assessment: No/denies pain    Home  Living Family/patient expects to be discharged to:: Private residence Living Arrangements: Spouse/significant other Available Help at Discharge: Family Type of Home: Mobile home Home Access: Stairs to enter Entrance Stairs-Rails:  (storm door) Entrance Stairs-Number of Steps: 1 Home Layout: One level Home Equipment: Cane - single point;Walker - 4 wheels      Prior Function Level of Independence: Independent               Hand Dominance        Extremity/Trunk Assessment   Upper Extremity Assessment: Overall WFL for tasks assessed (mild weakness)           Lower Extremity Assessment: Overall WFL for tasks assessed (some mild weakness bil and peripheral neuropathy)         Communication   Communication: No difficulties  Cognition Arousal/Alertness: Awake/alert Behavior During Therapy: WFL for tasks assessed/performed Overall Cognitive Status: Within Functional Limits for tasks assessed                      General Comments General comments (skin integrity, edema, etc.): SpO2 at 99% and EHR 98 bpm    Exercises        Assessment/Plan    PT Assessment Patent does not need any further PT services (pt can benefit from cardiac/pulm rehab)  PT Diagnosis     PT Problem List    PT Treatment Interventions  PT Goals (Current goals can be found in the Care Plan section) Acute Rehab PT Goals PT Goal Formulation: All assessment and education complete, DC therapy    Frequency     Barriers to discharge        Co-evaluation               End of Session   Activity Tolerance: Patient tolerated treatment well Patient left: in bed (sitting EOB) Nurse Communication: Mobility status         Time: 1610-9604 PT Time Calculation (min) (ACUTE ONLY): 27 min   Charges:   PT Evaluation $PT Eval Low Complexity: 1 Procedure PT Treatments $Gait Training: 8-22 mins   PT G Codes:        [name], [name] 09/24/2015, 4:47 PM 09/24/2015  [name], PT [phone]  (pager)

## 2015-09-25 DIAGNOSIS — E1101 Type 2 diabetes mellitus with hyperosmolarity with coma: Secondary | ICD-10-CM

## 2015-09-25 DIAGNOSIS — E1100 Type 2 diabetes mellitus with hyperosmolarity without nonketotic hyperglycemic-hyperosmolar coma (NKHHC): Secondary | ICD-10-CM | POA: Diagnosis present

## 2015-09-25 LAB — GLUCOSE, CAPILLARY
GLUCOSE-CAPILLARY: 437 mg/dL — AB (ref 65–99)
GLUCOSE-CAPILLARY: 236 mg/dL — AB (ref 65–99)
GLUCOSE-CAPILLARY: 202 mg/dL — AB (ref 65–99)
Glucose-Capillary: 426 mg/dL — ABNORMAL HIGH (ref 65–99)
Glucose-Capillary: 372 mg/dL — ABNORMAL HIGH (ref 65–99)
Glucose-Capillary: 374 mg/dL — ABNORMAL HIGH (ref 65–99)
Glucose-Capillary: 285 mg/dL — ABNORMAL HIGH (ref 65–99)
Glucose-Capillary: 271 mg/dL — ABNORMAL HIGH (ref 65–99)
Glucose-Capillary: 257 mg/dL — ABNORMAL HIGH (ref 65–99)
Glucose-Capillary: 193 mg/dL — ABNORMAL HIGH (ref 65–99)
Glucose-Capillary: 206 mg/dL — ABNORMAL HIGH (ref 65–99)
Glucose-Capillary: 252 mg/dL — ABNORMAL HIGH (ref 65–99)
Glucose-Capillary: 343 mg/dL — ABNORMAL HIGH (ref 65–99)

## 2015-09-25 LAB — BASIC METABOLIC PANEL
Anion gap: 15 (ref 5–15)
Anion gap: 16 — ABNORMAL HIGH (ref 5–15)
BUN: 64 mg/dL — AB (ref 6–20)
BUN: 67 mg/dL — AB (ref 6–20)
CALCIUM: 8 mg/dL — AB (ref 8.9–10.3)
CO2: 23 mmol/L (ref 22–32)
CO2: 22 mmol/L (ref 22–32)
CREATININE: 8.97 mg/dL — AB (ref 0.61–1.24)
CREATININE: 9.03 mg/dL — AB (ref 0.61–1.24)
Calcium: 8.1 mg/dL — ABNORMAL LOW (ref 8.9–10.3)
Chloride: 95 mmol/L — ABNORMAL LOW (ref 101–111)
Chloride: 94 mmol/L — ABNORMAL LOW (ref 101–111)
GFR calc Af Amer: 6 mL/min — ABNORMAL LOW (ref >60)
GFR calc Af Amer: 6 mL/min — ABNORMAL LOW (ref >60)
GFR, EST NON AFRICAN AMERICAN: 5 mL/min — AB (ref >60)
GFR, EST NON AFRICAN AMERICAN: 5 mL/min — AB (ref >60)
GLUCOSE: 206 mg/dL — AB (ref 65–99)
Glucose, Bld: 246 mg/dL — ABNORMAL HIGH (ref 65–99)
Potassium: 3.6 mmol/L (ref 3.5–5.1)
Potassium: 3.6 mmol/L (ref 3.5–5.1)
SODIUM: 133 mmol/L — AB (ref 135–145)
Sodium: 132 mmol/L — ABNORMAL LOW (ref 135–145)

## 2015-09-25 MED ORDER — DELFLEX-LC/4.25% DEXTROSE 483 MOSM/L IP SOLN
INTRAPERITONEAL | Status: DC
Start: 2015-09-25 — End: 2015-09-26

## 2015-09-25 MED ORDER — DELFLEX-LC/4.25% DEXTROSE 483 MOSM/L IP SOLN
INTRAPERITONEAL | Status: DC
  Administered 2015-09-25: 5000 mL via INTRAPERITONEAL

## 2015-09-25 MED ORDER — SODIUM CHLORIDE 0.9 % IV SOLN
INTRAVENOUS | Status: DC
Start: 2015-09-25 — End: 2015-09-25

## 2015-09-25 MED ORDER — SODIUM CHLORIDE 0.9 % IV SOLN: INTRAVENOUS | Status: DC

## 2015-09-25 MED ORDER — SODIUM CHLORIDE 0.9 % IV SOLN
INTRAVENOUS | Status: DC
  Administered 2015-09-25: 3.7 [IU]/h via INTRAVENOUS
  Filled 2015-09-25: qty 2.5

## 2015-09-25 MED ORDER — DEXTROSE-NACL 5-0.45 % IV SOLN
INTRAVENOUS | Status: DC
Start: 2015-09-25 — End: 2015-09-26
  Administered 2015-09-25: 17:00:00 via INTRAVENOUS

## 2015-09-25 MED ORDER — INSULIN NPH (HUMAN) (ISOPHANE) 100 UNIT/ML ~~LOC~~ SUSP
75.0000 [IU] | Freq: Two times a day (BID) | SUBCUTANEOUS | Status: DC
Start: 2015-09-25 — End: 2015-09-25
  Administered 2015-09-25: 75 [IU] via SUBCUTANEOUS

## 2015-09-25 MED ORDER — HEPARIN 1000 UNIT/ML FOR PERITONEAL DIALYSIS: 500.0000 [IU] | INTRAMUSCULAR | Status: DC | PRN

## 2015-09-25 MED ORDER — GENTAMICIN SULFATE 0.1 % EX CREA
1.0000 "application " | TOPICAL_CREAM | Freq: Every day | CUTANEOUS | Status: DC
  Administered 2015-09-25 – 2015-09-26 (×2): 1 via TOPICAL
  Filled 2015-09-25: qty 15

## 2015-09-25 MED ORDER — INSULIN NPH (HUMAN) (ISOPHANE) 100 UNIT/ML ~~LOC~~ SUSP
75.0000 [IU] | Freq: Two times a day (BID) | SUBCUTANEOUS | Status: DC
  Administered 2015-09-25: 75 [IU] via SUBCUTANEOUS
  Filled 2015-09-25: qty 10

## 2015-09-25 NOTE — Progress Notes (Signed)
Subjective:  Sugar high overnight - UF with PD around 1.5 liters - hands much better  Objective Vital signs in last 24 hours: Filed Vitals:   09/24/15 2045 09/25/15 0523 09/25/15 0745 09/25/15 1000  BP: 157/68 155/76 150/61   Pulse: 84 83 85   Temp: 97.5 F (36.4 C) 98.2 F (36.8 C) 97.8 F (36.6 C)   TempSrc: Oral Oral Oral   Resp: Height:     (1.753 m)  Weight:      SpO2: 100% 97% 97%    Weight change:   Intake/Output Summary (Last 24 hours) at 09/25/15 1055 Last data filed at 09/25/15 1026  Gross per 24 hour  Intake  11914 ml  Output  11350 ml  Net    361 ml    Assessment/ Plan: Pt is a 75 y.o. yo male with ESRD on PD who was admitted on 09/21/2015 with polyarthritis also with recent CAD with treatment  Assessment/Plan: 1. Polyarthirits- basically just treated with prednisone with improvement- elevated uric acid would likely benefit from urate lowering agent in OP setting  2. ESRD - have been continuing CCPD with home regimen- is volume overloaded- see plan below 3. Anemia- hgb stable at 9- not on specific treatment here - anticipate will be discharged home and can be treated per protocol as OP 4. Secondary hyperparathyroidism- continue home renvela 5. HTN/volume- is overloaded- patient aware to use 4.25% fluid to achieve more UF- if stays tonight will do - but if goes home has supplies to do it at home as well - will do all 4.25% fluid tonight  6. Dispo- stable for discharge from renal standpoint - sugar high due to steroids and higher sugar content of PD fluid - needs temporary increase in his home regimen and rapid taper of prednisone  Velvia Mehrer A    Labs: Basic Metabolic Panel:  Recent Labs Lab 09/21/15 2116  09/23/15 0432 09/23/15 1822 09/24/15 0622  NA  --   < > 129* 128* 130*  K  --   < > 3.9 3.8 3.8  CL  --   < > 92* 92* 93*  CO2  --   < > 21* 19* 20*  GLUCOSE  --   < > 466* 361* 281*  BUN  --   < > 67* 69* 66*  CREATININE  9.58*  < > 10.20* 10.31* 9.67*  CALCIUM 7.5*  < > 7.2* 7.4* 7.7*  PHOS 9.1*  --  9.1*  --   --   < > = values in this interval not displayed. Liver Function Tests:  Recent Labs Lab 09/21/15 1100 09/22/15 0728 09/23/15 0432  AST 22 21  --   ALT 19 16*  --   ALKPHOS 69 53  --   BILITOT 0.5 0.4  --   PROT 6.7 5.2*  --   ALBUMIN 2.7* 1.9* 1.8*   No results for input(s): LIPASE, AMYLASE in the last 168 hours. No results for input(s): AMMONIA in the last 168 hours. CBC:  Recent Labs Lab 09/21/15 1100 09/21/15 2116 09/22/15 0728 09/23/15 0432 09/24/15 0622  WBC 16.7* 19.1* 18.4* 16.7* 18.1*  NEUTROABS 13.6*  --   --   --   --   HGB 11.0* 10.7* 9.2* 9.1* 9.1*  HCT 31.2* 30.4* 27.5* 28.3* 27.3*  MCV 89.1 89.7 89.6 90.1 88.1  PLT 247 223 197 204 244   Cardiac Enzymes:  Recent Labs Lab 09/22/15 1538 09/22/15 2050 09/23/15 0432  TROPONINI 1.42* 0.97* 0.91*   CBG:  Recent Labs Lab 09/24/15 0755 09/24/15 1135 09/24/15 1642 09/24/15 2143 09/25/15 0743  GLUCAP 307* 342* 256* 311* 437*    Iron Studies: No results for input(s): IRON, TIBC, TRANSFERRIN, FERRITIN in the last 72 hours. Studies/Results: Dg Abd Portable 1v  09/24/2015  CLINICAL DATA:  Ileus, history hypertension, diabetes mellitus, coronary artery disease, CHF, COPD, end-stage renal disease on peritoneal dialysis EXAM: PORTABLE ABDOMEN - 1 VIEW COMPARISON:  Portable exam 0811 hours compared to 09/21/2015 FINDINGS: Scattered stool and gas throughout nondistended colon. Density at inferior pelvis centrally likely represents a small amount of excreted contrast material within urinary bladder. Nonobstructive bowel gas pattern. No bowel dilatation or bowel wall thickening. Bones demineralized. IMPRESSION: Nonspecific bowel gas pattern. Electronically Signed   By: Ulyses SouthwardMark  Boles M.D.   On: 09/24/2015 08:22   Medications: Infusions: . dextrose 5 % and 0.45% NaCl    . insulin (NOVOLIN-R) infusion 3.7 Units/hr (09/25/15  1036)    Scheduled Medications: . aspirin EC  81 mg Oral Daily  . atorvastatin  80 mg Oral q1800  . carvedilol  12.5 mg Oral BID WC  . dialysis solution 2.5% low-MG/low-CA   Intraperitoneal Q24H  . dialysis solution 4.25% low-MG/low-CA   Intraperitoneal Q24H  . dialysis solution 4.25% low-MG/low-CA   Intraperitoneal Q24H  . docusate sodium  100 mg Oral Daily  . gentamicin cream  1 application Topical Daily  . gentamicin cream  1 application Topical Daily  . heparin  5,000 Units Subcutaneous Q8H  . insulin aspart  0-15 Units Subcutaneous TID WC  . insulin aspart  0-5 Units Subcutaneous QHS  . isosorbide mononitrate  120 mg Oral Daily  . predniSONE  40 mg Oral Q breakfast  . sevelamer carbonate  800 mg Oral TID WC  . sodium chloride flush  3 mL Intravenous Q12H  . ticagrelor  90 mg Oral BID  . Vitamin D (Ergocalciferol)  50,000 Units Oral Q7 days    have reviewed scheduled and prn medications.  Physical Exam: General: NAD Heart: RRR Lungs: mostly clear Abdomen: fluid in- non tender Extremities: pitting edema    09/25/2015,10:55 AM  LOS: 4 days

## 2015-09-25 NOTE — Care Management Note (Signed)
Case Management Note  Patient Details  Name: Joshua [name] MRN: [id number] Date of Birth: [birth year]  Subjective/Objective:         CM following for progression and d/c planning.            Action/Plan: 09/25/2015 [facility] or [facility] at this time will continue to follow for any dc needs.   Expected Discharge Date:                  Expected Discharge Plan:  Home/Self Care  In-House Referral:  NA  Discharge planning Services  CM Consult  Post Acute Care Choice:  NA Choice offered to:  NA  DME Arranged:  N/A DME Agency:  NA  HH Arranged:  NA HH Agency:  NA  Status of Service:  Completed, signed off  Medicare Important Message Given:  Yes Date Medicare IM Given:    Medicare IM give by:    Date Additional Medicare IM Given:    Additional Medicare Important Message give by:     If discussed at Long Length of Stay Meetings, dates discussed:    Additional Comments:  Starlyn SkeansRoyal, Cyndia Degraff U, RN 09/25/2015, 1:10 PM

## 2015-09-25 NOTE — Progress Notes (Signed)
PROGRESS NOTE                                                                                                                                                                                                             Patient Demographics:    [name], is a 75 y.o. male, DOB - Oct 08, 1940, ZOX:[id number]  Admit date - 09/21/2015   Admitting Physician Houston Siren, MD  Outpatient Primary MD for the patient is Pcp Not In System  LOS - 4  Outpatient Specialists: Renal cardiology  Chief Complaint  Patient presents with  . Joint Swelling       Brief Narrative   75 year old male with history of uncontrolled diabetes mellitus, ESRD on peritoneal dialysis, COPD, CAD status post CABG with recent STE MI with cardiac cath done and stent placed and discharged on dual antiplatelet therapy from [place] on 09/19/15, history of CHF, prior CVA, history of gout presented to the ED with diffuse joint pains with swelling, subjective fevers. Patient reports having joint pains in the past related to gout but was usually limited to MTP. In the ED patient had WBC of 16 K, creatinine of 8.8 And potassium of 3.4. Chest x-ray was unremarkable. UA and LFTs were normal. Given concern for sepsis patient was started on empiric vancomycin and Zosyn. Given inability to perform peritoneal dialysis at [facility] he was transferred to [facility].   Subjective:   Joint pain improves but has weakness and feels short of breath on getting up. cbg still in 300s to 400s   Assessment  & Plan :   Principal problem SIRS with polyarthritis Secondary to acute polyarticular gouty arthritis. Responding well to oral prednisone. Continue pain control with when necessary tramadol. Peritoneal fluid fluid negative for SBP. Uric acid level of 9. Avoiding colchicine and NSAIDs.  Reduce dose of prednisone and taper on d/c. Will need to start on allopurinol once acute  episode subsides. PT evaluation.   Active Problems: Uncontrolled type 2 diabetes mellitus Patient is on NPH 50 units twice a day at home and reports CBG mostly in the range of 250s. Also he is on prednisone and sugar content in dialysis fluid that's causing high blood glucose.. CBG is persistently elevated in the 300s and 400s. A1c of 9.4. Increased NPH to 75 u bid. Reduced prednisone dose plan on rapid  taper. I started patient on low-dose insulin drip. We'll check bMET. If CBG continues to remain stable below 250 of discontinue the drip and continue NPH.    Elevated troponin level No chest pain symptoms or EKG changes. Possibly resolving from recent NSTEMI. Troponin trending down. No further recommendations from cardiology.  Ileus (HCC) mild abdominal distention, nontender. Repeat xray shows nonobstructive bowel gas pattern. tolerating advanced diet.    ESRD on peritoneal dialysis [facility] Renal following.  CAD with recent NSTEMI Status post stenting and discharged on DAPT with aspirin and brillinta which is continued. Continue statin and beta blocker.    CVA (cerebral infarction) Continue aspirin and statin.     Acute respiratory failure with hypoxia (HCC) Resolved. Likely due to pain and abdominal distention.  Hyponatremia Adjust with peritoneal dialysis  Sjogren's syndrome Follows with outpatient rheumatology  Code Status : full code  Family Communication  : Wife at bedside  Disposition Plan  : Home in a.m. if CBG better controlled  Barriers For Discharge   hyperglycemia  Consults  :  Renal  Procedures  : Diagnostic paracentesis  DVT Prophylaxis  :  Heparin  Lab Results  Component Value Date   PLT 244 09/24/2015    Antibiotics  :    Anti-infectives    Start     Dose/Rate Route Frequency Ordered Stop   09/21/15 2200  piperacillin-tazobactam (ZOSYN) IVPB 2.25 g  Status:  Discontinued     2.25 g 100 mL/hr over 30 Minutes Intravenous Every 8 hours 09/21/15 2036  09/23/15 1625   09/21/15 1330  piperacillin-tazobactam (ZOSYN) IVPB 3.375 g     3.375 g 100 mL/hr over 30 Minutes Intravenous  Once 09/21/15 1325 09/21/15 1428        Objective:   Filed Vitals:   09/25/15 0523 09/25/15 0745 09/25/15 1000 09/25/15 1156  BP: 155/76 150/61    Pulse: 83 85    Temp: 98.2 F (36.8 C) 97.8 F (36.6 C)    TempSrc: Oral Oral    Resp: 19 18    Height:   5\' 9"  (1.753 m)   Weight:    93.7 kg (206 lb 9.1 oz)  SpO2: 97% 97%      Wt Readings from Last 3 Encounters:  09/25/15 93.7 kg (206 lb 9.1 oz)     Intake/Output Summary (Last 24 hours) at 09/25/15 1404 Last data filed at 09/25/15 1026  Gross per 24 hour  Intake    723 ml  Output    150 ml  Net    573 ml     Physical Exam  Gen: not in distress HEENT: , moist mucosa, supple neck Chest: clear b/l, no added sounds CVS: N S1&S2, no murmurs, GI: soft, NT, Mild distention, bowel sounds present Musculoskeletal: warm, no edema, improved ROM of Joints   Data Review:    CBC  Recent Labs Lab 09/21/15 1100 09/21/15 2116 09/22/15 0728 09/23/15 0432 09/24/15 0622  WBC 16.7* 19.1* 18.4* 16.7* 18.1*  HGB 11.0* 10.7* 9.2* 9.1* 9.1*  HCT 31.2* 30.4* 27.5* 28.3* 27.3*  PLT 247 223 197 204 244  MCV 89.1 89.7 89.6 90.1 88.1  MCH 31.4 31.6 30.0 29.0 29.4  MCHC 35.3 35.2 33.5 32.2 33.3  RDW 12.9 13.0 13.3 13.0 13.1  LYMPHSABS 1.0  --   --   --   --   MONOABS 2.1*  --   --   --   --   EOSABS 0.0  --   --   --   --  BASOSABS 0.0  --   --   --   --     Chemistries   Recent Labs Lab 09/21/15 1100 09/21/15 2116 09/22/15 0728 09/23/15 0432 09/23/15 1822 09/24/15 0622  NA 132*  --  132* 129* 128* 130*  K 3.4*  --  4.2 3.9 3.8 3.8  CL 95*  --  95* 92* 92* 93*  CO2 24  --  22 21* 19* 20*  GLUCOSE 159*  --  141* 466* 361* 281*  BUN 54*  --  61* 67* 69* 66*  CREATININE 8.86* 9.58* 10.33* 10.20* 10.31* 9.67*  CALCIUM 7.9* 7.5* 7.3* 7.2* 7.4* 7.7*  MG  --  1.4*  --   --   --   --   AST 22   --  21  --   --   --   ALT 19  --  16*  --   --   --   ALKPHOS 69  --  53  --   --   --   BILITOT 0.5  --  0.4  --   --   --    ------------------------------------------------------------------------------------------------------------------ No results for input(s): CHOL, HDL, LDLCALC, TRIG, CHOLHDL, LDLDIRECT in the last 72 hours.  Lab Results  Component Value Date   HGBA1C 9.4* 09/22/2015   ------------------------------------------------------------------------------------------------------------------ No results for input(s): TSH, T4TOTAL, T3FREE, THYROIDAB in the last 72 hours.  Invalid input(s): FREET3 ------------------------------------------------------------------------------------------------------------------ No results for input(s): VITAMINB12, FOLATE, FERRITIN, TIBC, IRON, RETICCTPCT in the last 72 hours.  Coagulation profile  Recent Labs Lab 09/21/15 1100  INR 1.22    No results for input(s): DDIMER in the last 72 hours.  Cardiac Enzymes  Recent Labs Lab 09/22/15 1538 09/22/15 2050 09/23/15 0432  TROPONINI 1.42* 0.97* 0.91*   ------------------------------------------------------------------------------------------------------------------    Component Value Date/Time   BNP 821.0* 09/21/2015 1100    Inpatient Medications  Scheduled Meds: . aspirin EC  81 mg Oral Daily  . atorvastatin  80 mg Oral q1800  . carvedilol  12.5 mg Oral BID WC  . dialysis solution 2.5% low-MG/low-CA   Intraperitoneal Q24H  . dialysis solution 4.25% low-MG/low-CA   Intraperitoneal Q24H  . dialysis solution 4.25% low-MG/low-CA   Intraperitoneal Q24H  . docusate sodium  100 mg Oral Daily  . gentamicin cream  1 application Topical Daily  . gentamicin cream  1 application Topical Daily  . heparin  5,000 Units Subcutaneous Q8H  . isosorbide mononitrate  120 mg Oral Daily  . predniSONE  40 mg Oral Q breakfast  . sevelamer carbonate  800 mg Oral TID WC  . sodium chloride  flush  3 mL Intravenous Q12H  . ticagrelor  90 mg Oral BID  . Vitamin D (Ergocalciferol)  50,000 Units Oral Q7 days   Continuous Infusions: . dextrose 5 % and 0.45% NaCl    . insulin (NOVOLIN-R) infusion 4.5 Units/hr (09/25/15 1324)   PRN Meds:.heparin, ondansetron **OR** ondansetron (ZOFRAN) IV, traMADol  Micro Results Recent Results (from the past 240 hour(s))  Urine culture     Status: Abnormal   Collection Time: 09/21/15 12:18 PM  Result Value Ref Range Status   Specimen Description URINE, CLEAN CATCH  Final   Special Requests NONE  Final   Culture MULTIPLE SPECIES PRESENT, SUGGEST RECOLLECTION (A)  Final   Report Status 09/22/2015 FINAL  Final  Blood culture (routine x 2)     Status: None (Preliminary result)   Collection Time: 09/21/15  1:05 PM  Result Value Ref Range  Status   Specimen Description BLOOD RIGHT ANTECUBITAL DRAWN BY RN  Final   Special Requests   Final    BOTTLES DRAWN AEROBIC AND ANAEROBIC AEB=8CC ANA=6CC   Culture NO GROWTH 3 DAYS  Final   Report Status PENDING  Incomplete  Blood culture (routine x 2)     Status: None (Preliminary result)   Collection Time: 09/21/15  1:14 PM  Result Value Ref Range Status   Specimen Description BLOOD RIGHT HAND  Final   Special Requests BOTTLES DRAWN AEROBIC ONLY 8CC  Final   Culture NO GROWTH 3 DAYS  Final   Report Status PENDING  Incomplete  Body fluid culture     Status: None (Preliminary result)   Collection Time: 09/21/15  3:15 PM  Result Value Ref Range Status   Specimen Description PERITONEAL  Final   Special Requests NONE  Final   Gram Stain   Final    CYTOSPIN SMEAR RARE WBC SEEN NO ORGANISMS SEEN Performed at [facility]    Culture   Final    NO GROWTH 4 DAYS [facility]formed [facility]    Report Status PENDING  Incomplete    Radiology Reports Dg Chest 2 View  09/21/2015  CLINICAL DATA:  Status post coronary artery stent placement 09/18/2015. Generalized swelling. Bilateral lower  extremity pain. Initial encounter. EXAM: CHEST  2 VIEW COMPARISON:  None. FINDINGS: The patient is status post CABG. The lungs are clear. Heart size is upper normal. No pneumothorax or pleural effusion. IMPRESSION: No acute disease. Electronically Signed   By: Drusilla Kannerhomas  Dalessio M.D.   On: 09/21/2015 12:32   [place] Venous Img Upper Uni Right  09/21/2015  CLINICAL DATA:  Coronary stent placement on 09/18/2015 with right upper extremity pain edema and shortness of breath. Evaluate for DVT. History of diabetes and smoking. EXAM: RIGHT UPPER EXTREMITY VENOUS DOPPLER ULTRASOUND TECHNIQUE: Gray-scale sonography with graded compression, as well as color Doppler and duplex ultrasound were performed to evaluate the upper extremity deep venous system from the level of the subclavian vein and including the jugular, axillary, basilic, radial, ulnar and upper cephalic vein. Spectral Doppler was utilized to evaluate flow at rest and with distal augmentation maneuvers. COMPARISON:  None. FINDINGS: Contralateral Subclavian Vein: Respiratory phasicity is normal and symmetric with the symptomatic side. No evidence of thrombus. Normal compressibility. Internal Jugular Vein: No evidence of thrombus. Normal compressibility, respiratory phasicity and response to augmentation. Subclavian Vein: No evidence of thrombus. Normal compressibility, respiratory phasicity and response to augmentation. Axillary Vein: No evidence of thrombus. Normal compressibility, respiratory phasicity and response to augmentation. Cephalic Vein: No evidence of thrombus. Normal compressibility, respiratory phasicity and response to augmentation. Basilic Vein: No evidence of thrombus. Normal compressibility, respiratory phasicity and response to augmentation. Brachial Veins: No evidence of thrombus. Normal compressibility, respiratory phasicity and response to augmentation. Radial Veins: No evidence of thrombus. Normal compressibility, respiratory phasicity and  response to augmentation. Ulnar Veins: No evidence of thrombus. Normal compressibility, respiratory phasicity and response to augmentation. Venous Reflux:  None visualized. Other Findings:  None visualized. IMPRESSION: No evidence of DVT within the right upper extremity. Electronically Signed   By: Simonne ComeJohn  Watts M.D.   On: 09/21/2015 14:01   Dg Abd 2 Views  09/21/2015  CLINICAL DATA:  Decreased urine output. EXAM: ABDOMEN - 2 VIEW COMPARISON:  None. FINDINGS: Upright film shows no evidence for intraperitoneal free air. Diffuse gaseous distention of small bowel is evident with nondilated colon containing stool and possibly oral contrast  material. Peritoneal dialysis catheter seen coiled in the right lower quadrant the abdomen. Multiple wires from telemetry device overlying the abdomen. IMPRESSION: Diffuse gaseous bowel distention. Imaging features do not suggest a frank, overt small bowel obstruction, but could be compatible with ileus. Electronically Signed   By: Kennith Center M.D.   On: 09/21/2015 12:30   Dg Abd Portable 1v  09/24/2015  CLINICAL DATA:  Ileus, history hypertension, diabetes mellitus, coronary artery disease, CHF, COPD, end-stage renal disease on peritoneal dialysis EXAM: PORTABLE ABDOMEN - 1 VIEW COMPARISON:  Portable exam 0811 hours compared to 09/21/2015 FINDINGS: Scattered stool and gas throughout nondistended colon. Density at inferior pelvis centrally likely represents a small amount of excreted contrast material within urinary bladder. Nonobstructive bowel gas pattern. No bowel dilatation or bowel wall thickening. Bones demineralized. IMPRESSION: Nonspecific bowel gas pattern. Electronically Signed   By: Ulyses Southward M.D.   On: 09/24/2015 08:22    Time Spent in minutes 25   Eddie North M.D on 09/25/2015 at 2:04 PM  Between 7am to 7pm - Pager - [phone]  After 7pm go to [email or web address] - password [facility]  [facility] -  Office  [phone]

## 2015-09-25 NOTE — Care Management Important Message (Signed)
Important Message  Patient Details  Name: Joshua [name] MRN: [id number] Date of Birth: [birth year]   Medicare Important Message Given:  Yes    Bernadette HoitShoffner, Beatric Fulop Coleman 09/25/2015, 9:33 AM

## 2015-09-26 DIAGNOSIS — E1165 Type 2 diabetes mellitus with hyperglycemia: Secondary | ICD-10-CM

## 2015-09-26 DIAGNOSIS — M3500 Sicca syndrome, unspecified: Secondary | ICD-10-CM | POA: Diagnosis present

## 2015-09-26 DIAGNOSIS — R6510 Systemic inflammatory response syndrome (SIRS) of non-infectious origin without acute organ dysfunction: Principal | ICD-10-CM

## 2015-09-26 DIAGNOSIS — Z794 Long term (current) use of insulin: Secondary | ICD-10-CM

## 2015-09-26 LAB — CULTURE, BLOOD (ROUTINE X 2)
Culture: NO GROWTH
Culture: NO GROWTH

## 2015-09-26 LAB — BASIC METABOLIC PANEL
ANION GAP: 14 (ref 5–15)
BUN: 67 mg/dL — ABNORMAL HIGH (ref 6–20)
CALCIUM: 8.2 mg/dL — AB (ref 8.9–10.3)
CO2: 22 mmol/L (ref 22–32)
CREATININE: 8.4 mg/dL — AB (ref 0.61–1.24)
Chloride: 96 mmol/L — ABNORMAL LOW (ref 101–111)
GFR calc Af Amer: 6 mL/min — ABNORMAL LOW (ref >60)
GFR, EST NON AFRICAN AMERICAN: 5 mL/min — AB (ref >60)
Glucose, Bld: 333 mg/dL — ABNORMAL HIGH (ref 65–99)
POTASSIUM: 3.6 mmol/L (ref 3.5–5.1)
Sodium: 132 mmol/L — ABNORMAL LOW (ref 135–145)

## 2015-09-26 LAB — GLUCOSE, CAPILLARY
GLUCOSE-CAPILLARY: 361 mg/dL — AB (ref 65–99)
GLUCOSE-CAPILLARY: 366 mg/dL — AB (ref 65–99)
GLUCOSE-CAPILLARY: 327 mg/dL — AB (ref 65–99)
Glucose-Capillary: 246 mg/dL — ABNORMAL HIGH (ref 65–99)

## 2015-09-26 LAB — BODY FLUID CULTURE: Culture: NO GROWTH

## 2015-09-26 MED ORDER — INSULIN NPH (HUMAN) (ISOPHANE) 100 UNIT/ML ~~LOC~~ SUSP
85.0000 [IU] | Freq: Two times a day (BID) | SUBCUTANEOUS | Status: DC
  Administered 2015-09-26: 85 [IU] via SUBCUTANEOUS
  Filled 2015-09-26: qty 10

## 2015-09-26 MED ORDER — INSULIN NPH ISOPHANE & REGULAR (70-30) 100 UNIT/ML ~~LOC~~ SUSP: 85.0000 [IU] | Freq: Every day | SUBCUTANEOUS | Status: DC

## 2015-09-26 MED ORDER — PREDNISONE 20 MG PO TABS: 40.0000 mg | ORAL_TABLET | Freq: Every day | ORAL | Status: AC

## 2015-09-26 NOTE — Discharge Instructions (Addendum)
Gout Gout is when your joints become red, sore, and swell (inflamed). This is caused by the buildup of uric acid crystals in the joints. Uric acid is a chemical that is normally in the blood. If the level of uric acid gets too high in the blood, these crystals form in your joints and tissues. Over time, these crystals can form into masses near the joints and tissues. These masses can destroy bone and cause the bone to look misshapen (deformed). HOME CARE   Do not take aspirin for pain.  Only take medicine as told by your doctor.  Rest the joint as much as you can. When in bed, keep sheets and blankets off painful areas.  Keep the sore joints raised (elevated).  Put warm or cold packs on painful joints. Use of warm or cold packs depends on which works best for you.  Use crutches if the painful joint is in your leg.  Drink enough fluids to keep your pee (urine) clear or pale yellow. Limit alcohol, sugary drinks, and drinks with fructose in them.  Follow your diet instructions. Pay careful attention to how much protein you eat. Include fruits, vegetables, whole grains, and fat-free or low-fat milk products in your daily diet. Talk to your doctor or dietitian about the use of coffee, vitamin C, and cherries. These may help lower uric acid levels.  Keep a healthy body weight. GET HELP RIGHT AWAY IF:   You have watery poop (diarrhea), throw up (vomit), or have any side effects from medicines.  You do not feel better in 24 hours, or you are getting worse.  Your joint becomes suddenly more tender, and you have chills or a fever. MAKE SURE YOU:   Understand these instructions.  Will watch your condition.  Will get help right away if you are not doing well or get worse.   This information is not intended to replace advice given to you by your health care provider. Make sure you discuss any questions you have with your health care provider.   Document Released: 01/05/2008 Document Revised:  04/18/2014 Document Reviewed: 11/09/2011 Elsevier Interactive Patient Education 2016 [employer]   Diabetes Mellitus and Food It is important for you to manage your blood sugar (glucose) level. Your blood glucose level can be greatly affected by what you eat. Eating healthier foods in the appropriate amounts throughout the day at about the same time each day will help you control your blood glucose level. It can also help slow or prevent worsening of your diabetes mellitus. Healthy eating may even help you improve the level of your blood pressure and reach or maintain a healthy weight.  General recommendations for healthful eating and cooking habits include:  Eating meals and snacks regularly. Avoid going long periods of time without eating to lose weight.  Eating a diet that consists mainly of plant-based foods, such as fruits, vegetables, nuts, legumes, and whole grains.  Using low-heat cooking methods, such as baking, instead of high-heat cooking methods, such as deep frying. Work with your dietitian to make sure you understand how to use the Nutrition Facts information on food labels. HOW CAN FOOD AFFECT ME? Carbohydrates Carbohydrates affect your blood glucose level more than any other type of food. Your dietitian will help you determine how many carbohydrates to eat at each meal and teach you how to count carbohydrates. Counting carbohydrates is important to keep your blood glucose at a healthy level, especially if you are using insulin or taking certain medicines  for diabetes mellitus. Alcohol Alcohol can cause sudden decreases in blood glucose (hypoglycemia), especially if you use insulin or take certain medicines for diabetes mellitus. Hypoglycemia can be a life-threatening condition. Symptoms of hypoglycemia (sleepiness, dizziness, and disorientation) are similar to symptoms of having too much alcohol.  If your health care provider has given you approval to drink alcohol, do so in  moderation and use the following guidelines:  Women should not have more than one drink per day, and men should not have more than two drinks per day. One drink is equal to:  12 oz of beer.  5 oz of wine.  1 oz of hard liquor.  Do not drink on an empty stomach.  Keep yourself hydrated. Have water, diet soda, or unsweetened iced tea.  Regular soda, juice, and other mixers might contain a lot of carbohydrates and should be counted. WHAT FOODS ARE NOT RECOMMENDED? As you make food choices, it is important to remember that all foods are not the same. Some foods have fewer nutrients per serving than other foods, even though they might have the same number of calories or carbohydrates. It is difficult to get your body what it needs when you eat foods with fewer nutrients. Examples of foods that you should avoid that are high in calories and carbohydrates but low in nutrients include:  Trans fats (most processed foods list trans fats on the Nutrition Facts label).  Regular soda.  Juice.  Candy.  Sweets, such as cake, pie, doughnuts, and cookies.  Fried foods. WHAT FOODS CAN I EAT? Eat nutrient-rich foods, which will nourish your body and keep you healthy. The food you should eat also will depend on several factors, including:  The calories you need.  The medicines you take.  Your weight.  Your blood glucose level.  Your blood pressure level.  Your cholesterol level. You should eat a variety of foods, including:  Protein.  Lean cuts of meat.  Proteins low in saturated fats, such as fish, egg whites, and beans. Avoid processed meats.  Fruits and vegetables.  Fruits and vegetables that may help control blood glucose levels, such as apples, mangoes, and yams.  Dairy products.  Choose fat-free or low-fat dairy products, such as milk, yogurt, and cheese.  Grains, bread, pasta, and rice.  Choose whole grain products, such as multigrain bread, whole oats, and brown  rice. These foods may help control blood pressure.  Fats.  Foods containing healthful fats, such as nuts, avocado, olive oil, canola oil, and fish. DOES EVERYONE WITH DIABETES MELLITUS HAVE THE SAME MEAL PLAN? Because every person with diabetes mellitus is different, there is not one meal plan that works for everyone. It is very important that you meet with a dietitian who will help you create a meal plan that is just right for you.   This information is not intended to replace advice given to you by your health care provider. Make sure you discuss any questions you have with your health care provider.   Document Released: 12/23/2004 Document Revised: 04/18/2014 Document Reviewed: 02/22/2013 Elsevier Interactive Patient Education [employer]

## 2015-09-26 NOTE — Progress Notes (Signed)
Subjective:  Sugar better- UF better with all 4.25% fluid- feels ready to go home  Objective Vital signs in last 24 hours: Filed Vitals:   09/25/15 1156 09/25/15 1854 09/26/15 0450 09/26/15 0909  BP:  142/55 162/69 159/71  Pulse:  80 85 89  Temp:  98.5 F (36.9 C) 97.8 F (36.6 C) 97.7 F (36.5 C)  TempSrc:  Oral Oral Oral  Resp:  17 18 18   Height:      Weight: 93.7 kg (206 lb 9.1 oz)     SpO2:  97% 97% 98%   Weight change:   Intake/Output Summary (Last 24 hours) at 09/26/15 0942 Last data filed at 09/26/15 0900  Gross per 24 hour  Intake    765 ml  Output      0 ml  Net    765 ml    Assessment/ Plan: Pt is a 75 y.o. yo male with ESRD on PD who was admitted on 09/21/2015 with polyarthritis also with recent CAD with treatment  Assessment/Plan: 1. Polyarthirits- basically just treated with prednisone with improvement- elevated uric acid would likely benefit from urate lowering agent in OP setting  2. ESRD - have been continuing CCPD with home regimen- is volume overloaded- responded to higher tonicity in fluid- advised patient and wife of what to do at home 3. Anemia- hgb stable at 9- not on specific treatment here - anticipate will be discharged home and can be treated per protocol as OP 4. Secondary hyperparathyroidism- continue home renvela 5. HTN/volume- is overloaded- patient aware to use 4.25% fluid to achieve more UF-  has supplies to do it at home as well -  6. Dispo- stable for discharge from renal standpoint - sugar high due to steroids and higher sugar content of PD fluid - needs temporary increase in his home regimen and rapid taper of prednisone  Alyse Kathan A    Labs: Basic Metabolic Panel:  Recent Labs Lab 09/21/15 2116  09/23/15 0432  09/25/15 1414 09/25/15 1827 09/26/15 0220  NA  --   < > 129*  < > 133* 132* 132*  K  --   < > 3.9  < > 3.6 3.6 3.6  CL  --   < > 92*  < > 95* 94* 96*  CO2  --   < > 21*  < > 23 22 22   GLUCOSE  --   < > 466*  <  > 246* 206* 333*  BUN  --   < > 67*  < > 64* 67* 67*  CREATININE 9.58*  < > 10.20*  < > 8.97* 9.03* 8.40*  CALCIUM 7.5*  < > 7.2*  < > 8.0* 8.1* 8.2*  PHOS 9.1*  --  9.1*  --   --   --   --   < > = values in this interval not displayed. Liver Function Tests:  Recent Labs Lab 09/21/15 1100 09/22/15 0728 09/23/15 0432  AST 22 21  --   ALT 19 16*  --   ALKPHOS 69 53  --   BILITOT 0.5 0.4  --   PROT 6.7 5.2*  --   ALBUMIN 2.7* 1.9* 1.8*   No results for input(s): LIPASE, AMYLASE in the last 168 hours. No results for input(s): AMMONIA in the last 168 hours. CBC:  Recent Labs Lab 09/21/15 1100 09/21/15 2116 09/22/15 0728 09/23/15 0432 09/24/15 0622  WBC 16.7* 19.1* 18.4* 16.7* 18.1*  NEUTROABS 13.6*  --   --   --   --  HGB 11.0* 10.7* 9.2* 9.1* 9.1*  HCT 31.2* 30.4* 27.5* 28.3* 27.3*  MCV 89.1 89.7 89.6 90.1 88.1  PLT 247 223 197 204 244   Cardiac Enzymes:  Recent Labs Lab 09/22/15 1538 09/22/15 2050 09/23/15 0432  TROPONINI 1.42* 0.97* 0.91*   CBG:  Recent Labs Lab 09/25/15 2326 09/26/15 0218 09/26/15 0421 09/26/15 0637 09/26/15 0744  GLUCAP 343* 361* 366* 327* 246*    Iron Studies: No results for input(s): IRON, TIBC, TRANSFERRIN, FERRITIN in the last 72 hours. Studies/Results: No results found. Medications: Infusions: . insulin (NOVOLIN-R) infusion Stopped (09/25/15 2000)    Scheduled Medications: . aspirin EC  81 mg Oral Daily  . atorvastatin  80 mg Oral q1800  . carvedilol  12.5 mg Oral BID WC  . dialysis solution 2.5% low-MG/low-CA   Intraperitoneal Q24H  . dialysis solution 4.25% low-MG/low-CA   Intraperitoneal Q24H  . dialysis solution 4.25% low-MG/low-CA   Intraperitoneal Q24H  . docusate sodium  100 mg Oral Daily  . gentamicin cream  1 application Topical Daily  . gentamicin cream  1 application Topical Daily  . heparin  5,000 Units Subcutaneous Q8H  . insulin NPH Human  85 Units Subcutaneous BID AC & HS  . isosorbide mononitrate   120 mg Oral Daily  . predniSONE  40 mg Oral Q breakfast  . sevelamer carbonate  800 mg Oral TID WC  . sodium chloride flush  3 mL Intravenous Q12H  . ticagrelor  90 mg Oral BID  . Vitamin D (Ergocalciferol)  50,000 Units Oral Q7 days    have reviewed scheduled and prn medications.  Physical Exam: General: NAD Heart: RRR Lungs: mostly clear Abdomen: fluid in- non tender Extremities: pitting edema    09/26/2015,9:42 AM  LOS: 5 days

## 2015-09-26 NOTE — Progress Notes (Signed)
Patient discharge teaching given, including activity, diet, follow-up appoints, and medications. Patient verbalized understanding of all discharge instructions. IV access was d/c'd. Vitals are stable. Skin is intact except as charted in most recent assessments. Pt to be escorted out by NT, to be driven home by family.  Meribeth Vitug, MBA, BSN, RN 

## 2015-09-26 NOTE — Discharge Summary (Addendum)
Physician Discharge Summary  Joshua [name] WUJ:[id number] DOB: [birth year] DOA: 09/21/2015  PCP: Pcp Not In System  Admit date: 09/21/2015 Discharge date: 09/26/2015  Admitted From: home Disposition:  home  Recommendations for Outpatient Follow-up:  Home with PCP follow up in 1 week.Please start patient on allopurinol during outpatient follow-up. Please adjust insulin dose based on his CBGs.   Home Health: no  Equipment/Devices: no  Discharge Condition: Stable CODE STATUS: Full code Diet recommendation: Heart Healthy / Carb Modified    Discharge Diagnoses:  Principal problem SIRS (HCC)   Active Problems:    Elevated troponin level   ESRD on peritoneal dialysis (HCC)   CVA (cerebral infarction)   Ileus (HCC)   Acute respiratory failure with hypoxia (HCC)   Acute gouty arthritis   CAD (coronary artery disease) of artery bypass graft   Hyperosmolar non-ketotic state in patient with type 2 diabetes mellitus (HCC)   Sjogren's syndrome (HCC)   Uncontrolled type 2 diabetes mellitus with hyperglycemia, with long-term current use of insulin [facility](HCC)  Brief Narrative  75 year old male with history of uncontrolled diabetes mellitus, ESRD on peritoneal dialysis, COPD, CAD status post CABG with recent STE MI with cardiac cath done and stent placed and discharged on dual antiplatelet therapy from [place] on 09/19/15, history of CHF, prior CVA, history of gout presented to the ED with diffuse joint pains with swelling, subjective fevers. Patient reports having joint pains in the past related to gout but was usually limited to MTP. In the ED patient had WBC of 16 K, creatinine of 8.8 And potassium of 3.4. Chest x-ray was unremarkable. UA and LFTs were normal. Given concern for sepsis patient was started on empiric vancomycin and Zosyn. Given inability to perform peritoneal dialysis at [facility] he was transferred to [facility].  Hospital course  Principal problem SIRS secondary to  acute gouty polyarthritis Secondary to acute polyarticular gouty arthritis. Responding well to oral prednisone. Peritoneal fluid fluid negative for SBP. Uric acid level of 9. Avoiding colchicine and NSAIDs.  Will discharge on 5 more days of oral prednisone and stop (given uncontrolled blood glucose). Will need to start on allopurinol once acute episode subsides.   Active problems Uncontrolled type 2 diabetes mellitus with hyperosmolar nonketotic state Patient is on NPH 50 units twice a day at home and reports CBG mostly in the range of 250s. Also he was prednisone and sugar content in dialysis fluid that caused his blood glucose to be persistently elevated in the 400s-500. This was not controlled despite increasing his NPH up to 75 units twice a day and reducing prednisone dose. Patient was placed on low-dose insulin drip during the day on 6/16 which improved the blood glucose. CBG in low 300s today. I will increase his NPH to 85 units twice a day and discharge him on oral prednisone 40 mg daily for 4 more days. I have instructed patient and his wife to keep a log of his blood glucose (both fasting and postprandial) and shortly to his PCP during outpatient follow-up. Also provided resource on meal planning.     Elevated troponin level No chest pain symptoms or EKG changes. Possibly resolving from recent NSTEMI. Troponin trending down. No further recommendations from cardiology.  Ileus (HCC) mild abdominal distention, nontender. Repeat xray shows nonobstructive bowel gas pattern. tolerating advanced diet.   ESRD on peritoneal dialysis [facility](HCC) Renal following patient will in the hospital.  CAD with recent NSTEMI Status post stenting and discharged on DAPT with aspirin and brillinta  which is continued. Continue statin and beta blocker.   CVA (cerebral infarction) Continue aspirin and statin.    Acute respiratory failure with hypoxia (HCC) Resolved. Likely due to pain and abdominal  distention.  Hyponatremia Adjusted with peritoneal dialysis  Sjogren's syndrome Follows with outpatient rheumatology  Code Status : full code  Family Communication : Wife at bedside  Disposition Plan : Home    Consults : Renal  Procedures : Diagnostic paracentesis   Discharge Instructions     Medication List    TAKE these medications        aspirin EC 81 MG tablet  Take 81 mg by mouth daily.     atorvastatin 80 MG tablet  Commonly known as:  LIPITOR  Take 80 mg by mouth daily at 6 PM.     BRILINTA 90 MG Tabs tablet  Generic drug:  ticagrelor  Take 90 mg by mouth 2 (two) times daily.     carvedilol 12.5 MG tablet  Commonly known as:  COREG  Take 12.5 mg by mouth 2 (two) times daily with a meal.     docusate sodium 100 MG capsule  Commonly known as:  COLACE  Take 100 mg by mouth daily.     furosemide 40 MG tablet  Commonly known as:  LASIX  Take 40 mg by mouth 2 (two) times daily.     insulin NPH-regular Human (70-30) 100 UNIT/ML injection  Commonly known as:  NOVOLIN 70/30  Inject 85 Units into the skin daily with breakfast. And 50 units in the evening.     isosorbide mononitrate 120 MG 24 hr tablet  Commonly known as:  IMDUR  Take 120 mg by mouth daily.     predniSONE 20 MG tablet  Commonly known as:  DELTASONE  Take 2 tablets (40 mg total) by mouth daily with breakfast.     sevelamer carbonate 800 MG tablet  Commonly known as:  RENVELA  Take 800 mg by mouth 3 (three) times daily with meals.     Vitamin D (Ergocalciferol) 50000 units Caps capsule  Commonly known as:  DRISDOL  Take 50,000 Units by mouth every 7 (seven) days.           Follow-up Information    Please follow up.   Why:  PCP Dr [name] in [place]     Allergies  Allergen Reactions  . Lisinopril     hives  . Methotrexate Derivatives     blisters  . Sulfa Antibiotics     hives  . Tape     Hives   . Vancomycin     Blisters      Procedures/Studies: Dg  Chest 2 View  09/21/2015  CLINICAL DATA:  Status post coronary artery stent placement 09/18/2015. Generalized swelling. Bilateral lower extremity pain. Initial encounter. EXAM: CHEST  2 VIEW COMPARISON:  None. FINDINGS: The patient is status post CABG. The lungs are clear. Heart size is upper normal. No pneumothorax or pleural effusion. IMPRESSION: No acute disease. Electronically Signed   By: Drusilla Kanner M.D.   On: 09/21/2015 12:32   US Venous Img Upper Uni Right  09/21/2015  CLINICAL DATA:  Coronary stent placement on 09/18/2015 with right upper extremity pain edema and shortness of breath. Evaluate for DVT. History of diabetes and smoking. EXAM: RIGHT UPPER EXTREMITY VENOUS DOPPLER ULTRASOUND TECHNIQUE: Gray-scale sonography with graded compression, as well as color Doppler and duplex ultrasound were performed to evaluate the upper extremity deep venous system from the level of  the subclavian vein and including the jugular, axillary, basilic, radial, ulnar and upper cephalic vein. Spectral Doppler was utilized to evaluate flow at rest and with distal augmentation maneuvers. COMPARISON:  None. FINDINGS: Contralateral Subclavian Vein: Respiratory phasicity is normal and symmetric with the symptomatic side. No evidence of thrombus. Normal compressibility. Internal Jugular Vein: No evidence of thrombus. Normal compressibility, respiratory phasicity and response to augmentation. Subclavian Vein: No evidence of thrombus. Normal compressibility, respiratory phasicity and response to augmentation. Axillary Vein: No evidence of thrombus. Normal compressibility, respiratory phasicity and response to augmentation. Cephalic Vein: No evidence of thrombus. Normal compressibility, respiratory phasicity and response to augmentation. Basilic Vein: No evidence of thrombus. Normal compressibility, respiratory phasicity and response to augmentation. Brachial Veins: No evidence of thrombus. Normal compressibility,  respiratory phasicity and response to augmentation. Radial Veins: No evidence of thrombus. Normal compressibility, respiratory phasicity and response to augmentation. Ulnar Veins: No evidence of thrombus. Normal compressibility, respiratory phasicity and response to augmentation. Venous Reflux:  None visualized. Other Findings:  None visualized. IMPRESSION: No evidence of DVT within the right upper extremity. Electronically Signed   By: Simonne Come M.D.   On: 09/21/2015 14:01   Dg Abd 2 Views  09/21/2015  CLINICAL DATA:  Decreased urine output. EXAM: ABDOMEN - 2 VIEW COMPARISON:  None. FINDINGS: Upright film shows no evidence for intraperitoneal free air. Diffuse gaseous distention of small bowel is evident with nondilated colon containing stool and possibly oral contrast material. Peritoneal dialysis catheter seen coiled in the right lower quadrant the abdomen. Multiple wires from telemetry device overlying the abdomen. IMPRESSION: Diffuse gaseous bowel distention. Imaging features do not suggest a frank, overt small bowel obstruction, but could be compatible with ileus. Electronically Signed   By: Kennith Center M.D.   On: 09/21/2015 12:30   Dg Abd Portable 1v  09/24/2015  CLINICAL DATA:  Ileus, history hypertension, diabetes mellitus, coronary artery disease, CHF, COPD, end-stage renal disease on peritoneal dialysis EXAM: PORTABLE ABDOMEN - 1 VIEW COMPARISON:  Portable exam 0811 hours compared to 09/21/2015 FINDINGS: Scattered stool and gas throughout nondistended colon. Density at inferior pelvis centrally likely represents a small amount of excreted contrast material within urinary bladder. Nonobstructive bowel gas pattern. No bowel dilatation or bowel wall thickening. Bones demineralized. IMPRESSION: Nonspecific bowel gas pattern. Electronically Signed   By: Ulyses Southward M.D.   On: 09/24/2015 08:22    (Echo, Carotid, EGD, Colonoscopy, ERCP)    Subjective:   Discharge Exam: Filed Vitals:    09/26/15 0450 09/26/15 0909  BP: 162/69 159/71  Pulse: 85 89  Temp: 97.8 F (36.6 C) 97.7 F (36.5 C)  Resp: 18 18   Filed Vitals:   09/25/15 1156 09/25/15 1854 09/26/15 0450 09/26/15 0909  BP:  142/55 162/69 159/71  Pulse:  80 85 89  Temp:  98.5 F (36.9 C) 97.8 F (36.6 C) 97.7 F (36.5 C)  TempSrc:  Oral Oral Oral  Resp:  17 18 18   Height:      Weight: 93.7 kg (206 lb 9.1 oz)     SpO2:  97% 97% 98%    Gen: not in distress HEENT: , moist mucosa, supple neck Chest: clear b/l, no added sounds CVS: N S1&S2, no murmurs, GI: soft, NT, nondistended, PD catheter+,  bowel sounds present Musculoskeletal: warm, no edema, improved ROM of Joints CNS: Alert and oriented     The results of significant diagnostics from this hospitalization (including imaging, microbiology, ancillary and laboratory) are listed below for reference.  Microbiology: Recent Results (from the past 240 hour(s))  Urine culture     Status: Abnormal   Collection Time: 09/21/15 12:18 PM  Result Value Ref Range Status   Specimen Description URINE, CLEAN CATCH  Final   Special Requests NONE  Final   Culture MULTIPLE SPECIES PRESENT, SUGGEST RECOLLECTION (A)  Final   Report Status 09/22/2015 FINAL  Final  Blood culture (routine x 2)     Status: None (Preliminary result)   Collection Time: 09/21/15  1:05 PM  Result Value Ref Range Status   Specimen Description BLOOD RIGHT ANTECUBITAL DRAWN BY RN  Final   Special Requests   Final    BOTTLES DRAWN AEROBIC AND ANAEROBIC AEB=8CC ANA=6CC   Culture NO GROWTH 3 DAYS  Final   Report Status PENDING  Incomplete  Blood culture (routine x 2)     Status: None (Preliminary result)   Collection Time: 09/21/15  1:14 PM  Result Value Ref Range Status   Specimen Description BLOOD RIGHT HAND  Final   Special Requests BOTTLES DRAWN AEROBIC ONLY 8CC  Final   Culture NO GROWTH 3 DAYS  Final   Report Status PENDING  Incomplete  Body fluid culture     Status: None  (Preliminary result)   Collection Time: 09/21/15  3:15 PM  Result Value Ref Range Status   Specimen Description PERITONEAL  Final   Special Requests NONE  Final   Gram Stain   Final    CYTOSPIN SMEAR RARE WBC SEEN NO ORGANISMS SEEN Performed at [facility]    Culture   Final    NO GROWTH 4 DAYS Performed at [facility]    Report Status PENDING  Incomplete     Labs: BNP (last 3 results)  Recent Labs  09/21/15 1100  BNP 821.0*   Basic Metabolic Panel:  Recent Labs Lab 09/21/15 2116  09/23/15 0432 09/23/15 1822 09/24/15 0622 09/25/15 1414 09/25/15 1827 09/26/15 0220  NA  --   < > 129* 128* 130* 133* 132* 132*  K  --   < > 3.9 3.8 3.8 3.6 3.6 3.6  CL  --   < > 92* 92* 93* 95* 94* 96*  CO2  --   < > 21* 19* 20* GLUCOSE  --   < > 466* 361* 281* 246* 206* 333*  BUN  --   < > 67* 69* 66* 64* 67* 67*  CREATININE 9.58*  < > 10.20* 10.31* 9.67* 8.97* 9.03* 8.40*  CALCIUM 7.5*  < > 7.2* 7.4* 7.7* 8.0* 8.1* 8.2*  MG 1.4*  --   --   --   --   --   --   --   PHOS 9.1*  --  9.1*  --   --   --   --   --   < > = values in this interval not displayed. Liver Function Tests:  Recent Labs Lab 09/21/15 1100 09/22/15 0728 09/23/15 0432  AST 22 21  --   ALT 19 16*  --   ALKPHOS 69 53  --   BILITOT 0.5 0.4  --   PROT 6.7 5.2*  --   ALBUMIN 2.7* 1.9* 1.8*   No results for input(s): LIPASE, AMYLASE in the last 168 hours. No results for input(s): AMMONIA in the last 168 hours. CBC:  Recent Labs Lab 09/21/15 1100 09/21/15 2116 09/22/15 0728 09/23/15 0432 09/24/15 0622  WBC 16.7* 19.1* 18.4* 16.7* 18.1*  NEUTROABS 13.6*  --   --   --   --  HGB 11.0* 10.7* 9.2* 9.1* 9.1*  HCT 31.2* 30.4* 27.5* 28.3* 27.3*  MCV 89.1 89.7 89.6 90.1 88.1  PLT 247 223 197 204 244   Cardiac Enzymes:  Recent Labs Lab 09/22/15 1538 09/22/15 2050 09/23/15 0432  TROPONINI 1.42* 0.97* 0.91*   BNP: Invalid input(s): POCBNP CBG:  Recent Labs Lab  09/25/15 2326 09/26/15 0218 09/26/15 0421 09/26/15 0637 09/26/15 0744  GLUCAP 343* 361* 366* 327* 246*   D-Dimer No results for input(s): DDIMER in the last 72 hours. Hgb A1c No results for input(s): HGBA1C in the last 72 hours. Lipid Profile No results for input(s): CHOL, HDL, LDLCALC, TRIG, CHOLHDL, LDLDIRECT in the last 72 hours. Thyroid function studies No results for input(s): TSH, T4TOTAL, T3FREE, THYROIDAB in the last 72 hours.  Invalid input(s): FREET3 Anemia work up No results for input(s): VITAMINB12, FOLATE, FERRITIN, TIBC, IRON, RETICCTPCT in the last 72 hours. Urinalysis    Component Value Date/Time   COLORURINE YELLOW 09/21/2015 1218   APPEARANCEUR HAZY* 09/21/2015 1218   LABSPEC 1.020 09/21/2015 1218   PHURINE 5.5 09/21/2015 1218   GLUCOSEU 250* 09/21/2015 1218   HGBUR MODERATE* 09/21/2015 1218   BILIRUBINUR NEGATIVE 09/21/2015 1218   KETONESUR NEGATIVE 09/21/2015 1218   PROTEINUR >300* 09/21/2015 1218   NITRITE NEGATIVE 09/21/2015 1218   LEUKOCYTESUR NEGATIVE 09/21/2015 1218   Sepsis Labs Invalid input(s): PROCALCITONIN,  WBC,  LACTICIDVEN Microbiology Recent Results (from the past 240 hour(s))  Urine culture     Status: Abnormal   Collection Time: 09/21/15 12:18 PM  Result Value Ref Range Status   Specimen Description URINE, CLEAN CATCH  Final   Special Requests NONE  Final   Culture MULTIPLE SPECIES PRESENT, SUGGEST RECOLLECTION (A)  Final   Report Status 09/22/2015 FINAL  Final  Blood culture (routine x 2)     Status: None (Preliminary result)   Collection Time: 09/21/15  1:05 PM  Result Value Ref Range Status   Specimen Description BLOOD RIGHT ANTECUBITAL DRAWN BY RN  Final   Special Requests   Final    BOTTLES DRAWN AEROBIC AND ANAEROBIC AEB=8CC ANA=6CC   Culture NO GROWTH 3 DAYS  Final   Report Status PENDING  Incomplete  Blood culture (routine x 2)     Status: None (Preliminary result)   Collection Time: 09/21/15  1:14 PM  Result Value  Ref Range Status   Specimen Description BLOOD RIGHT HAND  Final   Special Requests BOTTLES DRAWN AEROBIC ONLY 8CC  Final   Culture NO GROWTH 3 DAYS  Final   Report Status PENDING  Incomplete  Body fluid culture     Status: None (Preliminary result)   Collection Time: 09/21/15  3:15 PM  Result Value Ref Range Status   Specimen Description PERITONEAL  Final   Special Requests NONE  Final   Gram Stain   Final    CYTOSPIN SMEAR RARE WBC SEEN NO ORGANISMS SEEN Performed at [facility]    Culture   Final    NO GROWTH 4 DAYS Performed at [facility]    Report Status PENDING  Incomplete     Time coordinating discharge: Over 30 minutes  SIGNED:   Eddie North, MD  [facility] 09/26/2015, 10:01 AM Pager   If 7PM-7AM, please contact night-coverage [email or web address] Password [email or web address]

## 2015-09-28 LAB — GLUCOSE, CAPILLARY: Glucose-Capillary: 246 mg/dL — ABNORMAL HIGH (ref 65–99)

## 2016-11-09 ENCOUNTER — Other Ambulatory Visit: Payer: Self-pay

## 2016-11-09 DIAGNOSIS — I739 Peripheral vascular disease, unspecified: Secondary | ICD-10-CM

## 2016-11-25 ENCOUNTER — Encounter (HOSPITAL_COMMUNITY): Payer: Self-pay

## 2016-11-25 ENCOUNTER — Inpatient Hospital Stay (HOSPITAL_COMMUNITY)
Admission: EM | Admit: 2016-11-25 | Discharge: 2016-11-26 | DRG: 299 | Disposition: A | Discharge status: Home / Self Care | Payer: Medicare Other | Attending: Family Medicine | Admitting: Family Medicine

## 2016-11-25 ENCOUNTER — Emergency Department (HOSPITAL_COMMUNITY): Payer: Medicare Other

## 2016-11-25 DIAGNOSIS — Z87891 Personal history of nicotine dependence: Secondary | ICD-10-CM | POA: Diagnosis not present

## 2016-11-25 DIAGNOSIS — I509 Heart failure, unspecified: Secondary | ICD-10-CM | POA: Diagnosis present

## 2016-11-25 DIAGNOSIS — N186 End stage renal disease: Secondary | ICD-10-CM | POA: Diagnosis present

## 2016-11-25 DIAGNOSIS — E1165 Type 2 diabetes mellitus with hyperglycemia: Secondary | ICD-10-CM | POA: Diagnosis present

## 2016-11-25 DIAGNOSIS — E1122 Type 2 diabetes mellitus with diabetic chronic kidney disease: Secondary | ICD-10-CM | POA: Diagnosis present

## 2016-11-25 DIAGNOSIS — E1152 Type 2 diabetes mellitus with diabetic peripheral angiopathy with gangrene: Principal | ICD-10-CM | POA: Diagnosis present

## 2016-11-25 DIAGNOSIS — Z79899 Other long term (current) drug therapy: Secondary | ICD-10-CM

## 2016-11-25 DIAGNOSIS — Z882 Allergy status to sulfonamides status: Secondary | ICD-10-CM

## 2016-11-25 DIAGNOSIS — Z7982 Long term (current) use of aspirin: Secondary | ICD-10-CM

## 2016-11-25 DIAGNOSIS — Z955 Presence of coronary angioplasty implant and graft: Secondary | ICD-10-CM

## 2016-11-25 DIAGNOSIS — J449 Chronic obstructive pulmonary disease, unspecified: Secondary | ICD-10-CM | POA: Diagnosis present

## 2016-11-25 DIAGNOSIS — Z992 Dependence on renal dialysis: Secondary | ICD-10-CM

## 2016-11-25 DIAGNOSIS — I739 Peripheral vascular disease, unspecified: Secondary | ICD-10-CM | POA: Diagnosis present

## 2016-11-25 DIAGNOSIS — Z951 Presence of aortocoronary bypass graft: Secondary | ICD-10-CM

## 2016-11-25 DIAGNOSIS — Z794 Long term (current) use of insulin: Secondary | ICD-10-CM

## 2016-11-25 DIAGNOSIS — Z888 Allergy status to other drugs, medicaments and biological substances status: Secondary | ICD-10-CM

## 2016-11-25 DIAGNOSIS — E7800 Pure hypercholesterolemia, unspecified: Secondary | ICD-10-CM | POA: Diagnosis present

## 2016-11-25 DIAGNOSIS — M3500 Sicca syndrome, unspecified: Secondary | ICD-10-CM | POA: Diagnosis present

## 2016-11-25 DIAGNOSIS — I132 Hypertensive heart and chronic kidney disease with heart failure and with stage 5 chronic kidney disease, or end stage renal disease: Secondary | ICD-10-CM | POA: Diagnosis present

## 2016-11-25 DIAGNOSIS — M8588 Other specified disorders of bone density and structure, other site: Secondary | ICD-10-CM | POA: Diagnosis present

## 2016-11-25 DIAGNOSIS — Z8673 Personal history of transient ischemic attack (TIA), and cerebral infarction without residual deficits: Secondary | ICD-10-CM | POA: Diagnosis not present

## 2016-11-25 DIAGNOSIS — Z7902 Long term (current) use of antithrombotics/antiplatelets: Secondary | ICD-10-CM

## 2016-11-25 DIAGNOSIS — Z881 Allergy status to other antibiotic agents status: Secondary | ICD-10-CM

## 2016-11-25 DIAGNOSIS — I96 Gangrene, not elsewhere classified: Secondary | ICD-10-CM | POA: Diagnosis present

## 2016-11-25 DIAGNOSIS — I25810 Atherosclerosis of coronary artery bypass graft(s) without angina pectoris: Secondary | ICD-10-CM | POA: Diagnosis present

## 2016-11-25 DIAGNOSIS — I2510 Atherosclerotic heart disease of native coronary artery without angina pectoris: Secondary | ICD-10-CM | POA: Diagnosis present

## 2016-11-25 LAB — CBC WITH DIFFERENTIAL/PLATELET
Basophils Absolute: 0 10*3/uL (ref 0.0–0.1)
Basophils Relative: 0 %
Eosinophils Absolute: 0.2 10*3/uL (ref 0.0–0.7)
Eosinophils Relative: 2 %
HCT: 34.6 % — ABNORMAL LOW (ref 39.0–52.0)
Hemoglobin: 11.6 g/dL — ABNORMAL LOW (ref 13.0–17.0)
Lymphocytes Relative: 13 %
Lymphs Abs: 1.4 10*3/uL (ref 0.7–4.0)
MCH: 31.9 pg (ref 26.0–34.0)
MCHC: 33.5 g/dL (ref 30.0–36.0)
MCV: 95.1 fL (ref 78.0–100.0)
Monocytes Absolute: 1.1 10*3/uL — ABNORMAL HIGH (ref 0.1–1.0)
Monocytes Relative: 11 %
Neutro Abs: 8.1 10*3/uL — ABNORMAL HIGH (ref 1.7–7.7)
Neutrophils Relative %: 74 %
Platelets: 211 10*3/uL (ref 150–400)
RBC: 3.64 MIL/uL — ABNORMAL LOW (ref 4.22–5.81)
RDW: 12.9 % (ref 11.5–15.5)
WBC: 10.8 10*3/uL — ABNORMAL HIGH (ref 4.0–10.5)

## 2016-11-25 LAB — COMPREHENSIVE METABOLIC PANEL
ALT: 10 U/L — ABNORMAL LOW (ref 17–63)
AST: 22 U/L (ref 15–41)
Albumin: 2.6 g/dL — ABNORMAL LOW (ref 3.5–5.0)
Alkaline Phosphatase: 58 U/L (ref 38–126)
Anion gap: 12 (ref 5–15)
BUN: 48 mg/dL — ABNORMAL HIGH (ref 6–20)
CO2: 26 mmol/L (ref 22–32)
Calcium: 8.9 mg/dL (ref 8.9–10.3)
Chloride: 98 mmol/L — ABNORMAL LOW (ref 101–111)
Creatinine, Ser: 6.57 mg/dL — ABNORMAL HIGH (ref 0.61–1.24)
GFR calc Af Amer: 9 mL/min — ABNORMAL LOW (ref >60)
GFR calc non Af Amer: 7 mL/min — ABNORMAL LOW (ref >60)
Glucose, Bld: 264 mg/dL — ABNORMAL HIGH (ref 65–99)
Potassium: 3.6 mmol/L (ref 3.5–5.1)
Sodium: 136 mmol/L (ref 135–145)
Total Bilirubin: 0.6 mg/dL (ref 0.3–1.2)
Total Protein: 6.6 g/dL (ref 6.5–8.1)

## 2016-11-25 LAB — I-STAT CG4 LACTIC ACID, ED: Lactic Acid, Venous: 1.46 mmol/L (ref 0.5–1.9)

## 2016-11-25 NOTE — ED Notes (Signed)
Pt states he does peritoneal dialysis at home [facility]

## 2016-11-25 NOTE — ED Provider Notes (Signed)
[facility]-EMERGENCY DEPT Provider Note   CSN: [id number] Arrival date & time: 11/25/16  1303     History   Chief Complaint Chief Complaint  Patient presents with  . Gangrene toe    HPI DERRECK FORTUNA is a 76 y.o. male with history of CAD, CHF, COPD, ESRD on dialysis who presents with a 2-day history of black toe. He has had ongoing pain in his feet for months and especially in the affected toe over the past few weeks. His doctor had been treating it for a callus. His toe turned black yesterday. His PCP did ABIs today he was sent here for further evaluation by vascular surgery due to left posterior tib ABI showing 0.49. Patient reports numbness to the foot which is chronic and worsening. Patient denies any fevers, chest pain, shortness of breath, abdominal pain, nausea, vomiting, urinary symptoms. He has not been taking any medications for his symptoms.  HPI  Past Medical History:  Diagnosis Date  . CHF (congestive heart failure) (HCC)   . COPD (chronic obstructive pulmonary disease) (HCC)   . Coronary artery disease    CABG 2011  . Diabetes mellitus without complication (HCC)   . ESRD on peritoneal dialysis [facility])    Started PD Dec 2016 in [place]. Nephrology is in [place].   [name] History of peritonitis    PD cath related peritonitis in April 2017  . Hypertension   . Sjogren's syndrome (HCC)    Per pt's wife, was diagnosed in 2016 during w/u for cause of renal failure prior to starting dialysis.  He had a rash on his chest apparently prompting this w/u.  Never had renal bx.  He was referred to a rheum MD in [place] and per the wife he was treated with MTX and other medications but had side effects to "all of it" and isn't taking anything for this as of Jun 2017.   . Stroke [facility])     Patient Active Problem List   Diagnosis Date Noted  . Gangrene of toe of left foot (HCC) 11/26/2016  . PAD (peripheral artery disease) (HCC) 11/26/2016  . Sjogren's syndrome (HCC) 09/26/2015  .  Uncontrolled type 2 diabetes mellitus with hyperglycemia, with long-term current use of insulin (HCC) 09/26/2015  . Hyperosmolar non-ketotic state in patient with type 2 diabetes mellitus (HCC) 09/25/2015  . Ileus (HCC) 09/22/2015  . Acute respiratory failure with hypoxia (HCC) 09/22/2015  . Acute gouty arthritis 09/22/2015  . CAD (coronary artery disease) of artery bypass graft 09/22/2015  . Sepsis (HCC) 09/21/2015  . Elevated troponin level 09/21/2015  . ESRD on peritoneal dialysis (HCC) 09/21/2015  . CVA (cerebral infarction) 09/21/2015    Past Surgical History:  Procedure Laterality Date  . cardiac catherization with stent placement  2017  . CORONARY ARTERY BYPASS GRAFT  2011       Home Medications    Prior to Admission medications   Medication Sig Start Date End Date Taking? Authorizing Provider  amoxicillin (AMOXIL) 500 MG capsule Take 2,000 mg by mouth See admin instructions. 1 hour prior to dental procedures   Yes [provider]  aspirin EC 81 MG tablet Take 81 mg by mouth daily.   Yes [provider]  atorvastatin (LIPITOR) 80 MG tablet Take 80 mg by mouth daily at 6 PM.   Yes [provider]  calcium acetate (PHOSLO) 667 MG capsule Take 667 mg by mouth 3 (three) times daily with meals.   Yes [provider]  carvedilol (  COREG) 12.5 MG tablet Take 12.5 mg by mouth 2 (two) times daily with a meal.   Yes [provider]  clopidogrel (PLAVIX) 75 MG tablet Take 75 mg by mouth daily.   Yes [provider]  docusate sodium (COLACE) 100 MG capsule Take 100 mg by mouth daily.   Yes [provider]  furosemide (LASIX) 40 MG tablet Take 40 mg by mouth 2 (two) times daily.   Yes [provider]  insulin NPH Human (HUMULIN N,NOVOLIN N) 100 UNIT/ML injection Inject 70 Units into the skin at bedtime.   Yes [provider]  insulin regular (NOVOLIN R,HUMULIN R) 100 units/mL injection Inject 20 Units into the  skin 3 (three) times daily before meals.   Yes [provider]  isosorbide mononitrate (IMDUR) 30 MG 24 hr tablet Take 30 mg by mouth daily.   Yes [provider]  losartan (COZAAR) 25 MG tablet Take 25 mg by mouth daily.   Yes [provider]  nitroGLYCERIN (NITROSTAT) 0.4 MG SL tablet Place 0.4 mg under the tongue every 5 (five) minutes as needed for chest pain.   Yes [provider]  oxyCODONE-acetaminophen (PERCOCET/ROXICET) 5-325 MG tablet Take 1 tablet by mouth every 4 (four) hours as needed for severe pain.   Yes [provider]  potassium chloride (K-DUR,KLOR-CON) 10 MEQ tablet Take 10 mEq by mouth daily.   Yes [provider]  Vitamin D, Ergocalciferol, (DRISDOL) 50000 units CAPS capsule Take 50,000 Units by mouth every 30 (thirty) days.    Yes [provider]    Family History History reviewed. No pertinent family history.  Social History Social History  Substance Use Topics  . Smoking status: Former Games developer  . Smokeless tobacco: Not on file  . Alcohol use No     Allergies   Lisinopril; Methotrexate derivatives; Sulfa antibiotics; Tape; and Vancomycin   Review of Systems Review of Systems  Constitutional: Negative for chills and fever.  HENT: Negative for facial swelling and sore throat.   Respiratory: Negative for shortness of breath.   Cardiovascular: Negative for chest pain.  Gastrointestinal: Negative for abdominal pain, nausea and vomiting.  Genitourinary: Negative for dysuria.  Musculoskeletal: Positive for arthralgias. Negative for back pain.  Skin: Positive for color change and wound. Negative for rash.  Neurological: Negative for headaches.  Psychiatric/Behavioral: The patient is not nervous/anxious.      Physical Exam Updated Vital Signs BP (!) 157/70   Pulse 81   Temp 99.7 F (37.6 C) (Oral)   Resp 17   Ht 5\' 9"  (1.753 m)   Wt 93.9 kg (207 lb)   SpO2 95%   BMI 30.57 kg/m   Physical  Exam  Constitutional: He appears well-developed and well-nourished. No distress.  HENT:  Head: Normocephalic and atraumatic.  Mouth/Throat: Oropharynx is clear and moist. No oropharyngeal exudate.  Eyes: Pupils are equal, round, and reactive to light. Conjunctivae are normal. Right eye exhibits no discharge. Left eye exhibits no discharge. No scleral icterus.  Neck: Normal range of motion. Neck supple. No thyromegaly present.  Cardiovascular: Normal rate, regular rhythm, normal heart sounds and intact distal pulses.  Exam reveals no gallop and no friction rub.   No murmur heard. Pulses:      Femoral pulses are 2+ on the right side, and 1+ on the left side.      Popliteal pulses are 0 on the right side, and 0 on the left side.       Dorsalis  pedis pulses are 0 on the right side, and 0 on the left side.       Posterior tibial pulses are 1+ on the right side, and 1+ on the left side.  DPs only detected with doppler  Pulmonary/Chest: Effort normal and breath sounds normal. No stridor. No respiratory distress. He has no wheezes. He has no rales.  Abdominal: Soft. Bowel sounds are normal. He exhibits no distension. There is no tenderness. There is no rebound and no guarding.  Musculoskeletal: He exhibits no edema.  Left third toe black with no sensation to MTP joint, sensation to sharp intact at MTP joints; DP pulse intact to left foot; tenderness to palpation to midfoot to ankle   Lymphadenopathy:    He has no cervical adenopathy.  Neurological: He is alert. Coordination normal.  Skin: Skin is warm and dry. No rash noted. He is not diaphoretic. No pallor.  Psychiatric: He has a normal mood and affect.  Nursing note and vitals reviewed.        ED Treatments / Results  Labs (all labs ordered are listed, but only abnormal results are displayed) Labs Reviewed  CBC WITH DIFFERENTIAL/PLATELET - Abnormal; Notable for the following:       Result Value   WBC 10.8 (*)    RBC 3.64 (*)     Hemoglobin 11.6 (*)    HCT 34.6 (*)    Neutro Abs 8.1 (*)    Monocytes Absolute 1.1 (*)    All other components within normal limits  COMPREHENSIVE METABOLIC PANEL - Abnormal; Notable for the following:    Chloride 98 (*)    Glucose, Bld 264 (*)    BUN 48 (*)    Creatinine, Ser 6.57 (*)    Albumin 2.6 (*)    ALT 10 (*)    GFR calc non Af Amer 7 (*)    GFR calc Af Amer 9 (*)    All other components within normal limits  BASIC METABOLIC PANEL  CBC WITH DIFFERENTIAL/PLATELET  HEMOGLOBIN A1C  SEDIMENTATION RATE  C-REACTIVE PROTEIN  I-STAT CG4 LACTIC ACID, ED    EKG  EKG Interpretation None       Radiology Dg Foot Complete Left  Result Date: 11/25/2016 CLINICAL DATA:  76 year old male with leaking pain of the foot. EXAM: LEFT FOOT - COMPLETE 3+ VIEW COMPARISON:  None. FINDINGS: There is no evidence of acute fracture or dislocation. There is diffuse osteopenia and degenerative changes limiting the evaluation for subtle underlying bony abnormalities. Within these limitations, no focal osseous findings are identified. Soft tissues are grossly unremarkable. IMPRESSION: No focal osseous abnormalities in the setting of diffuse osteopenia and degenerative changes. Please note, plain-film radiography has limited sensitivity for the detection of early osteomyelitis. Electronically Signed   By: Sande Brothers M.D.   On: 11/25/2016 15:29    Procedures Procedures (including critical care time)  Medications Ordered in ED Medications  piperacillin-tazobactam (ZOSYN) IVPB 3.375 g (3.375 g Intravenous New Bag/Given 11/26/16 0038)  calcium acetate (PHOSLO) capsule 667 mg (not administered)  clopidogrel (PLAVIX) tablet 75 mg (not administered)  isosorbide mononitrate (IMDUR) 24 hr tablet 30 mg (not administered)  losartan (COZAAR) tablet 25 mg (not administered)  nitroGLYCERIN (NITROSTAT) SL tablet 0.4 mg (not administered)  oxyCODONE-acetaminophen (PERCOCET/ROXICET) 5-325 MG per tablet 1-2  tablet (not administered)  potassium chloride (K-DUR,KLOR-CON) CR tablet 10 mEq (not administered)  aspirin EC tablet 81 mg (not administered)  atorvastatin (LIPITOR) tablet 80 mg (not administered)  carvedilol (COREG) tablet 12.5  mg (not administered)  docusate sodium (COLACE) capsule 100 mg (not administered)  furosemide (LASIX) tablet 40 mg (not administered)  heparin injection 5,000 Units (not administered)  sodium chloride flush (NS) 0.9 % injection 3 mL (not administered)  sodium chloride flush (NS) 0.9 % injection 3 mL (not administered)  0.9 %  sodium chloride infusion (not administered)  acetaminophen (TYLENOL) tablet 650 mg (not administered)    Or  acetaminophen (TYLENOL) suppository 650 mg (not administered)  senna-docusate (Senokot-S) tablet 1 tablet (not administered)  bisacodyl (DULCOLAX) EC tablet 5 mg (not administered)  insulin glargine (LANTUS) injection 50 Units (not administered)  insulin aspart (novoLOG) injection 0-20 Units (not administered)  insulin aspart (novoLOG) injection 0-5 Units (not administered)  insulin aspart (novoLOG) injection 10 Units (not administered)  cefTRIAXone (ROCEPHIN) 1 g in dextrose 5 % 50 mL IVPB (not administered)  metroNIDAZOLE (FLAGYL) IVPB 500 mg (not administered)     Initial Impression / Assessment and Plan / ED Course  I have reviewed the triage vital signs and the nursing notes.  Pertinent labs & imaging results that were available during my care of the patient were reviewed by me and considered in my medical decision making (see chart for details).     Patient with gangrenous left third toe. CBC shows WBC 10.8, hemoglobin 11.6. CMP with chronically elevated BUN/creatinine, patient is dialysis patient. Lactate 1.46. X-ray of left foot shows no focal osseous abnormalities. I consulted vascular surgery and spoke with [name] who recommended medical admission and will evaluate the patient in the hospital tomorrow. He also  advised initiation of antibiotics. Zosyn initiated in the ED. I consulted [facility] and spoke with Dr. Antionette Char who will admit the patient for further evaluation and treatment. Patient also evaluated by Dr. Ranae Palms who got with the patient's management and agrees with plan. Patient vitals stable throughout ED course.  Final Clinical Impressions(s) / ED Diagnoses   Final diagnoses:  Gangrene of toe of left foot [facility])    New Prescriptions New Prescriptions   No medications on file     Verdis Prime 11/26/16 0059    Loren Racer, MD 12/02/16 1501

## 2016-11-25 NOTE — ED Triage Notes (Signed)
Pt presents to the ed with complaints of his middle toe on the left foot being black, the patient went to his doctor today and had an US done and was told to come here to see vascular due to severe peripheral arterial disease involving the digits. Charge nurse notified.

## 2016-11-26 ENCOUNTER — Encounter (HOSPITAL_COMMUNITY): Payer: Self-pay | Admitting: Family Medicine

## 2016-11-26 DIAGNOSIS — N186 End stage renal disease: Secondary | ICD-10-CM | POA: Diagnosis present

## 2016-11-26 DIAGNOSIS — J449 Chronic obstructive pulmonary disease, unspecified: Secondary | ICD-10-CM | POA: Diagnosis present

## 2016-11-26 DIAGNOSIS — Z794 Long term (current) use of insulin: Secondary | ICD-10-CM

## 2016-11-26 DIAGNOSIS — Z881 Allergy status to other antibiotic agents status: Secondary | ICD-10-CM | POA: Diagnosis not present

## 2016-11-26 DIAGNOSIS — Z992 Dependence on renal dialysis: Secondary | ICD-10-CM | POA: Diagnosis not present

## 2016-11-26 DIAGNOSIS — I96 Gangrene, not elsewhere classified: Secondary | ICD-10-CM

## 2016-11-26 DIAGNOSIS — I25709 Atherosclerosis of coronary artery bypass graft(s), unspecified, with unspecified angina pectoris: Secondary | ICD-10-CM | POA: Diagnosis not present

## 2016-11-26 DIAGNOSIS — I739 Peripheral vascular disease, unspecified: Secondary | ICD-10-CM | POA: Diagnosis present

## 2016-11-26 DIAGNOSIS — Z8673 Personal history of transient ischemic attack (TIA), and cerebral infarction without residual deficits: Secondary | ICD-10-CM | POA: Diagnosis not present

## 2016-11-26 DIAGNOSIS — E1165 Type 2 diabetes mellitus with hyperglycemia: Secondary | ICD-10-CM | POA: Diagnosis present

## 2016-11-26 DIAGNOSIS — I132 Hypertensive heart and chronic kidney disease with heart failure and with stage 5 chronic kidney disease, or end stage renal disease: Secondary | ICD-10-CM | POA: Diagnosis present

## 2016-11-26 DIAGNOSIS — I2510 Atherosclerotic heart disease of native coronary artery without angina pectoris: Secondary | ICD-10-CM | POA: Diagnosis present

## 2016-11-26 DIAGNOSIS — Z882 Allergy status to sulfonamides status: Secondary | ICD-10-CM | POA: Diagnosis not present

## 2016-11-26 DIAGNOSIS — Z87891 Personal history of nicotine dependence: Secondary | ICD-10-CM | POA: Diagnosis not present

## 2016-11-26 DIAGNOSIS — M8588 Other specified disorders of bone density and structure, other site: Secondary | ICD-10-CM | POA: Diagnosis present

## 2016-11-26 DIAGNOSIS — E1122 Type 2 diabetes mellitus with diabetic chronic kidney disease: Secondary | ICD-10-CM | POA: Diagnosis present

## 2016-11-26 DIAGNOSIS — Z951 Presence of aortocoronary bypass graft: Secondary | ICD-10-CM | POA: Diagnosis not present

## 2016-11-26 DIAGNOSIS — Z79899 Other long term (current) drug therapy: Secondary | ICD-10-CM | POA: Diagnosis not present

## 2016-11-26 DIAGNOSIS — E1152 Type 2 diabetes mellitus with diabetic peripheral angiopathy with gangrene: Secondary | ICD-10-CM | POA: Diagnosis present

## 2016-11-26 DIAGNOSIS — Z888 Allergy status to other drugs, medicaments and biological substances status: Secondary | ICD-10-CM | POA: Diagnosis not present

## 2016-11-26 DIAGNOSIS — Z955 Presence of coronary angioplasty implant and graft: Secondary | ICD-10-CM | POA: Diagnosis not present

## 2016-11-26 HISTORY — DX: Gangrene, not elsewhere classified: I96

## 2016-11-26 LAB — CBC WITH DIFFERENTIAL/PLATELET
BASOS ABS: 0 10*3/uL (ref 0.0–0.1)
BASOS PCT: 0 %
EOS ABS: 0.2 10*3/uL (ref 0.0–0.7)
Eosinophils Relative: 2 %
HCT: 31.4 % — ABNORMAL LOW (ref 39.0–52.0)
Hemoglobin: 10.6 g/dL — ABNORMAL LOW (ref 13.0–17.0)
Lymphocytes Relative: 16 %
Lymphs Abs: 1.5 10*3/uL (ref 0.7–4.0)
MCH: 32 pg (ref 26.0–34.0)
MCHC: 33.8 g/dL (ref 30.0–36.0)
MCV: 94.9 fL (ref 78.0–100.0)
MONO ABS: 0.9 10*3/uL (ref 0.1–1.0)
MONOS PCT: 9 %
NEUTROS PCT: 73 %
Neutro Abs: 6.9 10*3/uL (ref 1.7–7.7)
Platelets: 188 10*3/uL (ref 150–400)
RBC: 3.31 MIL/uL — ABNORMAL LOW (ref 4.22–5.81)
RDW: 13.2 % (ref 11.5–15.5)
WBC: 9.4 10*3/uL (ref 4.0–10.5)

## 2016-11-26 LAB — BASIC METABOLIC PANEL
ANION GAP: 14 (ref 5–15)
BUN: 53 mg/dL — ABNORMAL HIGH (ref 6–20)
CO2: 23 mmol/L (ref 22–32)
CREATININE: 6.7 mg/dL — AB (ref 0.61–1.24)
Calcium: 8.5 mg/dL — ABNORMAL LOW (ref 8.9–10.3)
Chloride: 100 mmol/L — ABNORMAL LOW (ref 101–111)
GFR, EST AFRICAN AMERICAN: 8 mL/min — AB (ref >60)
GFR, EST NON AFRICAN AMERICAN: 7 mL/min — AB (ref >60)
GLUCOSE: 177 mg/dL — AB (ref 65–99)
Potassium: 3.2 mmol/L — ABNORMAL LOW (ref 3.5–5.1)
Sodium: 137 mmol/L (ref 135–145)

## 2016-11-26 LAB — GLUCOSE, CAPILLARY
Glucose-Capillary: 188 mg/dL — ABNORMAL HIGH (ref 65–99)
Glucose-Capillary: 171 mg/dL — ABNORMAL HIGH (ref 65–99)

## 2016-11-26 LAB — HEMOGLOBIN A1C
HEMOGLOBIN A1C: 11 % — AB (ref 4.8–5.6)
MEAN PLASMA GLUCOSE: 269 mg/dL

## 2016-11-26 LAB — C-REACTIVE PROTEIN: CRP: 19.3 mg/dL — ABNORMAL HIGH (ref <1.0)

## 2016-11-26 LAB — SEDIMENTATION RATE: SED RATE: 123 mm/h — AB (ref 0–16)

## 2016-11-26 MED ORDER — HYDRALAZINE HCL 20 MG/ML IJ SOLN
10.0000 mg | INTRAMUSCULAR | Status: DC | PRN
  Administered 2016-11-26 (×2): 10 mg via INTRAVENOUS
  Filled 2016-11-26 (×2): qty 1

## 2016-11-26 MED ORDER — LOSARTAN POTASSIUM 25 MG PO TABS
25.0000 mg | ORAL_TABLET | Freq: Every day | ORAL | Status: DC
  Administered 2016-11-26: 25 mg via ORAL
  Filled 2016-11-26: qty 1

## 2016-11-26 MED ORDER — ACETAMINOPHEN 650 MG RE SUPP: 650.0000 mg | Freq: Four times a day (QID) | RECTAL | Status: DC | PRN

## 2016-11-26 MED ORDER — SENNOSIDES-DOCUSATE SODIUM 8.6-50 MG PO TABS: 1.0000 | ORAL_TABLET | Freq: Every evening | ORAL | Status: DC | PRN

## 2016-11-26 MED ORDER — INSULIN ASPART 100 UNIT/ML ~~LOC~~ SOLN
0.0000 [IU] | Freq: Three times a day (TID) | SUBCUTANEOUS | Status: DC
  Administered 2016-11-26: 4 [IU] via SUBCUTANEOUS

## 2016-11-26 MED ORDER — DOCUSATE SODIUM 100 MG PO CAPS
100.0000 mg | ORAL_CAPSULE | Freq: Every day | ORAL | Status: DC
  Administered 2016-11-26: 100 mg via ORAL
  Filled 2016-11-26: qty 1

## 2016-11-26 MED ORDER — BISACODYL 5 MG PO TBEC: 5.0000 mg | DELAYED_RELEASE_TABLET | Freq: Every day | ORAL | Status: DC | PRN

## 2016-11-26 MED ORDER — PIPERACILLIN-TAZOBACTAM 3.375 G IVPB 30 MIN
3.3750 g | Freq: Once | INTRAVENOUS | Status: AC
  Administered 2016-11-26: 3.375 g via INTRAVENOUS
  Filled 2016-11-26: qty 50

## 2016-11-26 MED ORDER — CLOPIDOGREL BISULFATE 75 MG PO TABS
75.0000 mg | ORAL_TABLET | Freq: Every day | ORAL | Status: DC
  Administered 2016-11-26: 75 mg via ORAL
  Filled 2016-11-26: qty 1

## 2016-11-26 MED ORDER — SODIUM CHLORIDE 0.9% FLUSH: 3.0000 mL | Freq: Two times a day (BID) | INTRAVENOUS | Status: DC

## 2016-11-26 MED ORDER — ACETAMINOPHEN 325 MG PO TABS: 650.0000 mg | ORAL_TABLET | Freq: Four times a day (QID) | ORAL | Status: DC | PRN

## 2016-11-26 MED ORDER — INSULIN ASPART 100 UNIT/ML ~~LOC~~ SOLN
10.0000 [IU] | Freq: Three times a day (TID) | SUBCUTANEOUS | Status: DC
  Administered 2016-11-26: 10 [IU] via SUBCUTANEOUS

## 2016-11-26 MED ORDER — FUROSEMIDE 20 MG PO TABS
40.0000 mg | ORAL_TABLET | Freq: Two times a day (BID) | ORAL | Status: DC
  Administered 2016-11-26: 40 mg via ORAL
  Filled 2016-11-26: qty 2

## 2016-11-26 MED ORDER — POTASSIUM CHLORIDE CRYS ER 10 MEQ PO TBCR
10.0000 meq | EXTENDED_RELEASE_TABLET | Freq: Every day | ORAL | Status: DC
  Administered 2016-11-26: 10 meq via ORAL
  Filled 2016-11-26: qty 1

## 2016-11-26 MED ORDER — METRONIDAZOLE IN NACL 5-0.79 MG/ML-% IV SOLN
500.0000 mg | Freq: Three times a day (TID) | INTRAVENOUS | Status: DC
  Administered 2016-11-26: 500 mg via INTRAVENOUS
  Filled 2016-11-26 (×2): qty 100

## 2016-11-26 MED ORDER — SODIUM CHLORIDE 0.9 % IV SOLN: 250.0000 mL | INTRAVENOUS | Status: DC | PRN

## 2016-11-26 MED ORDER — SODIUM CHLORIDE 0.9% FLUSH: 3.0000 mL | INTRAVENOUS | Status: DC | PRN

## 2016-11-26 MED ORDER — INSULIN GLARGINE 100 UNIT/ML ~~LOC~~ SOLN
50.0000 [IU] | Freq: Every day | SUBCUTANEOUS | Status: DC
  Administered 2016-11-26: 50 [IU] via SUBCUTANEOUS
  Filled 2016-11-26: qty 0.5

## 2016-11-26 MED ORDER — DOXYCYCLINE HYCLATE 100 MG PO CAPS: 100.0000 mg | ORAL_CAPSULE | Freq: Two times a day (BID) | ORAL | 0 refills | Status: DC

## 2016-11-26 MED ORDER — NITROGLYCERIN 0.4 MG SL SUBL: 0.4000 mg | SUBLINGUAL_TABLET | SUBLINGUAL | Status: DC | PRN

## 2016-11-26 MED ORDER — HEPARIN SODIUM (PORCINE) 5000 UNIT/ML IJ SOLN: 5000.0000 [IU] | Freq: Three times a day (TID) | INTRAMUSCULAR | Status: DC

## 2016-11-26 MED ORDER — ASPIRIN EC 81 MG PO TBEC
81.0000 mg | DELAYED_RELEASE_TABLET | Freq: Every day | ORAL | Status: DC
  Administered 2016-11-26: 81 mg via ORAL
  Filled 2016-11-26: qty 1

## 2016-11-26 MED ORDER — SODIUM CHLORIDE 0.9 % IV SOLN
380.0000 mg | Freq: Once | INTRAVENOUS | Status: AC
  Administered 2016-11-26: 380 mg via INTRAVENOUS
  Filled 2016-11-26: qty 7.6

## 2016-11-26 MED ORDER — INSULIN ASPART 100 UNIT/ML ~~LOC~~ SOLN: 0.0000 [IU] | Freq: Every day | SUBCUTANEOUS | Status: DC

## 2016-11-26 MED ORDER — CALCIUM ACETATE (PHOS BINDER) 667 MG PO CAPS
667.0000 mg | ORAL_CAPSULE | Freq: Three times a day (TID) | ORAL | Status: DC
  Administered 2016-11-26: 667 mg via ORAL
  Filled 2016-11-26: qty 1

## 2016-11-26 MED ORDER — ISOSORBIDE MONONITRATE ER 30 MG PO TB24
30.0000 mg | ORAL_TABLET | Freq: Every day | ORAL | Status: DC
  Administered 2016-11-26: 30 mg via ORAL
  Filled 2016-11-26: qty 1

## 2016-11-26 MED ORDER — ATORVASTATIN CALCIUM 80 MG PO TABS: 80.0000 mg | ORAL_TABLET | Freq: Every day | ORAL | Status: DC

## 2016-11-26 MED ORDER — OXYCODONE-ACETAMINOPHEN 5-325 MG PO TABS: 1.0000 | ORAL_TABLET | ORAL | Status: DC | PRN

## 2016-11-26 MED ORDER — DEXTROSE 5 % IV SOLN
1.0000 g | Freq: Every day | INTRAVENOUS | Status: DC
  Administered 2016-11-26: 1 g via INTRAVENOUS
  Filled 2016-11-26: qty 10

## 2016-11-26 MED ORDER — CARVEDILOL 12.5 MG PO TABS
12.5000 mg | ORAL_TABLET | Freq: Two times a day (BID) | ORAL | Status: DC
  Administered 2016-11-26: 12.5 mg via ORAL
  Filled 2016-11-26: qty 1

## 2016-11-26 NOTE — Discharge Summary (Signed)
Physician Discharge Summary  [name] VFM:[id number] DOB: 10-Jul-1940 DOA: 11/25/2016  PCP: System, Pcp Not In  Admit date: 11/25/2016 Discharge date: 11/26/2016  Admitted From: HOME  Disposition: HOME   Recommendations for Outpatient Follow-up:  1. Follow up with vascular surgery on Tues for scheduled procedure  Discharge Condition: STABLE   CODE STATUS: FULL    Brief Hospitalization Summary: Please see all hospital notes, images, labs for full details of the hospitalization.  HPI: [name] is a 76 y.o. male with medical history significant for end-stage renal disease on peritoneal dialysis, and some dependent diabetes mellitus, urinary artery disease status post CABG, hypertension, and peripheral arterial disease, now presenting to the emergency department for evaluation of a painful and blackened toe. Patient reports that he noted an ulcer on the left third toe approximately 3 or 4 weeks ago. There was no significant pain at that time and it appeared to be stable. He developed increasing pain at that site over the past 2 or 3 days, and then noted blackening of the digit which she reports is taken place over the last 24 hours. He was evaluated today in an outpatient clinic with vascular ultrasound, told that he had severe PAD, and was advised to present to the emergency department for evaluation of this. He denies any fevers or chills. Denies chest pain or palpitations. No recent cough or dyspnea.  ED Course: Upon arrival to the ED, patient is found to be afebrile, saturating well on room air, and with vitals otherwise stable. Chemistry panel reveals a glucose of 264. CBC is notable for a mild leukocytosis to 10,800 and a stable normocytic anemia with hemoglobin of 11.6. Lactic acid is reassuring at 1.46. Radiographs of the left foot are notable for diffuse osteopenia, but no focal bony abnormality. Patient was given empiric Zosyn in the ED. Vascular surgery was consulted by the ED  physician and advised for medical admission, indicating that they will plan to evaluate the patient in the morning. Patient remained hemodynamically stable and in no apparent respiratory distress and will be admitted to the medical surgical unit for ongoing evaluation and management gangrenous toe in a diabetic with atherosclerotic vascular disease.  Patient was seen by vascular surgery and noted below: Gangrene to left third toe: I discussed with the patient and he is going to need an amputation of his left third toe, however before proceeding with this, he needs a formal evaluation of his blood flow to his leg to make sure that he has adequate perfusion to heal a toe amputation.  We discussed proceeding with angiography on Tuesday.  From my perspective he can be discharged and come back as an outpatient.  If he is discharged, I would like him to go home on oral antibiotics. Pt decided to go home.  He will be discharged on oral doxycycline.  He will do his PD tonight.  Vascular surgery made arrangements for him to return on Tues for study (angiography).    Discharge Diagnoses:  Principal Problem:   Gangrene of toe of left foot (HCC) Active Problems:   ESRD on peritoneal dialysis (HCC)   CAD (coronary artery disease) of artery bypass graft   Uncontrolled type 2 diabetes mellitus with hyperglycemia, with long-term current use of insulin (HCC)   PAD (peripheral artery disease) [facility])  Discharge Instructions: Discharge Instructions    Call MD for:  difficulty breathing, headache or visual disturbances    Complete by:  As directed    Call MD  for:  extreme fatigue    Complete by:  As directed    Call MD for:  persistant dizziness or light-headedness    Complete by:  As directed    Call MD for:  persistant nausea and vomiting    Complete by:  As directed    Call MD for:  redness, tenderness, or signs of infection (pain, swelling, redness, odor or green/yellow discharge around incision site)     Complete by:  As directed    Call MD for:  severe uncontrolled pain    Complete by:  As directed    Call MD for:  temperature >100.4    Complete by:  As directed    Increase activity slowly    Complete by:  As directed      Allergies as of 11/26/2016      Reactions   Lisinopril    hives   Methotrexate Derivatives    blisters   Sulfa Antibiotics    hives   Tape    Hives   Vancomycin    Blisters      Medication List    TAKE these medications   amoxicillin 500 MG capsule Commonly known as:  AMOXIL Take 2,000 mg by mouth See admin instructions. 1 hour prior to dental procedures   aspirin EC 81 MG tablet Take 81 mg by mouth daily.   atorvastatin 80 MG tablet Commonly known as:  LIPITOR Take 80 mg by mouth daily at 6 PM.   calcium acetate 667 MG capsule Commonly known as:  PHOSLO Take 667 mg by mouth 3 (three) times daily with meals.   carvedilol 12.5 MG tablet Commonly known as:  COREG Take 12.5 mg by mouth 2 (two) times daily with a meal.   clopidogrel 75 MG tablet Commonly known as:  PLAVIX Take 75 mg by mouth daily.   docusate sodium 100 MG capsule Commonly known as:  COLACE Take 100 mg by mouth daily.   doxycycline 100 MG capsule Commonly known as:  VIBRAMYCIN Take 1 capsule (100 mg total) by mouth 2 (two) times daily.   furosemide 40 MG tablet Commonly known as:  LASIX Take 40 mg by mouth 2 (two) times daily.   insulin NPH Human 100 UNIT/ML injection Commonly known as:  HUMULIN N,NOVOLIN N Inject 70 Units into the skin at bedtime.   insulin regular 100 units/mL injection Commonly known as:  NOVOLIN R,HUMULIN R Inject 20 Units into the skin 3 (three) times daily before meals.   isosorbide mononitrate 30 MG 24 hr tablet Commonly known as:  IMDUR Take 30 mg by mouth daily.   losartan 25 MG tablet Commonly known as:  COZAAR Take 25 mg by mouth daily.   nitroGLYCERIN 0.4 MG SL tablet Commonly known as:  NITROSTAT Place 0.4 mg under the tongue  every 5 (five) minutes as needed for chest pain.   oxyCODONE-acetaminophen 5-325 MG tablet Commonly known as:  PERCOCET/ROXICET Take 1 tablet by mouth every 4 (four) hours as needed for severe pain.   potassium chloride 10 MEQ tablet Commonly known as:  K-DUR,KLOR-CON Take 10 mEq by mouth daily.   Vitamin D (Ergocalciferol) 50000 units Caps capsule Commonly known as:  DRISDOL Take 50,000 Units by mouth every 30 (thirty) days.      Follow-up Information    Nada Libman, MD. Go on 11/29/2016.   Specialties:  Vascular Surgery, Cardiology Contact information: [address] [place] [address] [phone]  Allergies  Allergen Reactions  . Lisinopril     hives  . Methotrexate Derivatives     blisters  . Sulfa Antibiotics     hives  . Tape     Hives   . Vancomycin     Blisters    Current Discharge Medication List    START taking these medications   Details  doxycycline (VIBRAMYCIN) 100 MG capsule Take 1 capsule (100 mg total) by mouth 2 (two) times daily. Qty: 20 capsule, Refills: 0      CONTINUE these medications which have NOT CHANGED   Details  amoxicillin (AMOXIL) 500 MG capsule Take 2,000 mg by mouth See admin instructions. 1 hour prior to dental procedures    aspirin EC 81 MG tablet Take 81 mg by mouth daily.    atorvastatin (LIPITOR) 80 MG tablet Take 80 mg by mouth daily at 6 PM.    calcium acetate (PHOSLO) 667 MG capsule Take 667 mg by mouth 3 (three) times daily with meals.    carvedilol (COREG) 12.5 MG tablet Take 12.5 mg by mouth 2 (two) times daily with a meal.    clopidogrel (PLAVIX) 75 MG tablet Take 75 mg by mouth daily.    docusate sodium (COLACE) 100 MG capsule Take 100 mg by mouth daily.    furosemide (LASIX) 40 MG tablet Take 40 mg by mouth 2 (two) times daily.    insulin NPH Human (HUMULIN N,NOVOLIN N) 100 UNIT/ML injection Inject 70 Units into the skin at bedtime.    insulin regular (NOVOLIN R,HUMULIN R) 100 units/mL  injection Inject 20 Units into the skin 3 (three) times daily before meals.    isosorbide mononitrate (IMDUR) 30 MG 24 hr tablet Take 30 mg by mouth daily.    losartan (COZAAR) 25 MG tablet Take 25 mg by mouth daily.    nitroGLYCERIN (NITROSTAT) 0.4 MG SL tablet Place 0.4 mg under the tongue every 5 (five) minutes as needed for chest pain.    oxyCODONE-acetaminophen (PERCOCET/ROXICET) 5-325 MG tablet Take 1 tablet by mouth every 4 (four) hours as needed for severe pain.    potassium chloride (K-DUR,KLOR-CON) 10 MEQ tablet Take 10 mEq by mouth daily.    Vitamin D, Ergocalciferol, (DRISDOL) 50000 units CAPS capsule Take 50,000 Units by mouth every 30 (thirty) days.        Procedures/Studies: Dg Foot Complete Left  Result Date: 11/25/2016 CLINICAL DATA:  76 year old male with leaking pain of the foot. EXAM: LEFT FOOT - COMPLETE 3+ VIEW COMPARISON:  None. FINDINGS: There is no evidence of acute fracture or dislocation. There is diffuse osteopenia and degenerative changes limiting the evaluation for subtle underlying bony abnormalities. Within these limitations, no focal osseous findings are identified. Soft tissues are grossly unremarkable. IMPRESSION: No focal osseous abnormalities in the setting of diffuse osteopenia and degenerative changes. Please note, plain-film radiography has limited sensitivity for the detection of early osteomyelitis. Electronically Signed   By: Sande Brothers M.D.   On: 11/25/2016 15:29     Subjective: Pt without complaints.    Discharge Exam: Vitals:   11/26/16 0246 11/26/16 0500  BP: (!) 176/64 (!) 166/59  Pulse: 88 91  Resp: 16 18  Temp: 98.3 F (36.8 C) 99.3 F (37.4 C)  SpO2: 100% 94%   Vitals:   11/26/16 0015 11/26/16 0100 11/26/16 0246 11/26/16 0500  BP: (!) 157/70 (!) 177/69 (!) 176/64 (!) 166/59  Pulse: 81 99 88 91  Resp:   16 18  Temp:   98.3  F (36.8 C) 99.3 F (37.4 C)  TempSrc:   Oral Oral  SpO2: 95% 98% 100% 94%  Weight:   90.7 kg  (200 lb) 91.6 kg (202 lb)  Height:       General: Pt is alert, awake, not in acute distress Cardiovascular: RRR, S1/S2 +, no rubs, no gallops Respiratory: CTA bilaterally, no wheezing, no rhonchi Abdominal: Soft, NT, ND, bowel sounds + Extremities: dry gangrene left 3rd toe.   The results of significant diagnostics from this hospitalization (including imaging, microbiology, ancillary and laboratory) are listed below for reference.    Microbiology: No results found for this or any previous visit (from the past 240 hour(s)).   Labs: BNP (last 3 results) No results for input(s): BNP in the last 8760 hours. Basic Metabolic Panel:  Recent Labs Lab 11/25/16 1455 11/26/16 0550  NA 136 137  K 3.6 3.2*  CL 98* 100*  CO2 26 23  GLUCOSE 264* 177*  BUN 48* 53*  CREATININE 6.57* 6.70*  CALCIUM 8.9 8.5*   Liver Function Tests:  Recent Labs Lab 11/25/16 1455  AST 22  ALT 10*  ALKPHOS 58  BILITOT 0.6  PROT 6.6  ALBUMIN 2.6*   No results for input(s): LIPASE, AMYLASE in the last 168 hours. No results for input(s): AMMONIA in the last 168 hours. CBC:  Recent Labs Lab 11/25/16 1455 11/26/16 0550  WBC 10.8* 9.4  NEUTROABS 8.1* 6.9  HGB 11.6* 10.6*  HCT 34.6* 31.4*  MCV 95.1 94.9  PLT 211 188   Cardiac Enzymes: No results for input(s): CKTOTAL, CKMB, CKMBINDEX, TROPONINI in the last 168 hours. BNP: Invalid input(s): POCBNP CBG:  Recent Labs Lab 11/26/16 0539  GLUCAP 188*   D-Dimer No results for input(s): DDIMER in the last 72 hours. Hgb A1c  Recent Labs  11/26/16 0550  HGBA1C 11.0*   Lipid Profile No results for input(s): CHOL, HDL, LDLCALC, TRIG, CHOLHDL, LDLDIRECT in the last 72 hours. Thyroid function studies No results for input(s): TSH, T4TOTAL, T3FREE, THYROIDAB in the last 72 hours.  Invalid input(s): FREET3 Anemia work up No results for input(s): VITAMINB12, FOLATE, FERRITIN, TIBC, IRON, RETICCTPCT in the last 72 hours. Urinalysis     Component Value Date/Time   COLORURINE YELLOW 09/21/2015 1218   APPEARANCEUR HAZY (A) 09/21/2015 1218   LABSPEC 1.020 09/21/2015 1218   PHURINE 5.5 09/21/2015 1218   GLUCOSEU 250 (A) 09/21/2015 1218   HGBUR MODERATE (A) 09/21/2015 1218   BILIRUBINUR NEGATIVE 09/21/2015 1218   KETONESUR NEGATIVE 09/21/2015 1218   PROTEINUR >300 (A) 09/21/2015 1218   NITRITE NEGATIVE 09/21/2015 1218   LEUKOCYTESUR NEGATIVE 09/21/2015 1218   Sepsis Labs Invalid input(s): PROCALCITONIN,  WBC,  LACTICIDVEN Microbiology No results found for this or any previous visit (from the past 240 hour(s)).  Time coordinating discharge:   SIGNED:  Standley Dakins, MD  [facility] 11/26/2016, 11:47 AM Pager [phone]  If 7PM-7AM, please contact night-coverage [email or web address] Password [email or web address]

## 2016-11-26 NOTE — ED Notes (Signed)
IV team x 2 attempted access x 2 without success.

## 2016-11-26 NOTE — ED Notes (Signed)
IV team at  Bedside  

## 2016-11-26 NOTE — H&P (Signed)
History and Physical    [name]:[id number] DOB: 20-Feb-1941 DOA: 11/25/2016  PCP: System, Pcp Not In   Patient coming from: Home  Chief Complaint: Toe pain and discoloration   HPI: [name] is a 76 y.o. male with medical history significant for end-stage renal disease on peritoneal dialysis, and some dependent diabetes mellitus, urinary artery disease status post CABG, hypertension, and peripheral arterial disease, now presenting to the emergency department for evaluation of a painful and blackened toe. Patient reports that he noted an ulcer on the left third toe approximately 3 or 4 weeks ago. There was no significant pain at that time and it appeared to be stable. He developed increasing pain at that site over the past 2 or 3 days, and then noted blackening of the digit which she reports is taken place over the last 24 hours. He was evaluated today in an outpatient clinic with vascular ultrasound, told that he had severe PAD, and was advised to present to the emergency department for evaluation of this. He denies any fevers or chills. Denies chest pain or palpitations. No recent cough or dyspnea.  ED Course: Upon arrival to the ED, patient is found to be afebrile, saturating well on room air, and with vitals otherwise stable. Chemistry panel reveals a glucose of 264. CBC is notable for a mild leukocytosis to 10,800 and a stable normocytic anemia with hemoglobin of 11.6. Lactic acid is reassuring at 1.46. Radiographs of the left foot are notable for diffuse osteopenia, but no focal bony abnormality. Patient was given empiric Zosyn in the ED. Vascular surgery was consulted by the ED physician and advised for medical admission, indicating that they will plan to evaluate the patient in the morning. Patient remained hemodynamically stable and in no apparent respiratory distress and will be admitted to the medical surgical unit for ongoing evaluation and management gangrenous toe in a  diabetic with atherosclerotic vascular disease.  Review of Systems:  All other systems reviewed and apart from HPI, are negative.  Past Medical History:  Diagnosis Date  . CHF (congestive heart failure) (HCC)   . COPD (chronic obstructive pulmonary disease) (HCC)   . Coronary artery disease    CABG 2011  . Diabetes mellitus without complication (HCC)   . ESRD on peritoneal dialysis [facility])    Started PD Dec 2016 in [place]. Nephrology is in [place].   [name] History of peritonitis    PD cath related peritonitis in April 2017  . Hypertension   . Sjogren's syndrome (HCC)    Per pt's wife, was diagnosed in 2016 during w/u for cause of renal failure prior to starting dialysis.  He had a rash on his chest apparently prompting this w/u.  Never had renal bx.  He was referred to a rheum MD in [place] and per the wife he was treated with MTX and other medications but had side effects to "all of it" and isn't taking anything for this as of Jun 2017.   . Stroke [facility])     Past Surgical History:  Procedure Laterality Date  . cardiac catherization with stent placement  2017  . CORONARY ARTERY BYPASS GRAFT  2011     reports that he has quit smoking. He does not have any smokeless tobacco history on file. He reports that he does not drink alcohol or use drugs.  Allergies  Allergen Reactions  . Lisinopril     hives  . Methotrexate Derivatives     blisters  .  Sulfa Antibiotics     hives  . Tape     Hives   . Vancomycin     Blisters     History reviewed. No pertinent family history.   Prior to Admission medications   Medication Sig Start Date End Date Taking? Authorizing Provider  amoxicillin (AMOXIL) 500 MG capsule Take 2,000 mg by mouth See admin instructions. 1 hour prior to dental procedures   Yes [provider]  aspirin EC 81 MG tablet Take 81 mg by mouth daily.   Yes [provider]  atorvastatin (LIPITOR) 80 MG tablet Take 80 mg by mouth daily at 6 PM.   Yes  [provider]  calcium acetate (PHOSLO) 667 MG capsule Take 667 mg by mouth 3 (three) times daily with meals.   Yes [provider]  carvedilol (COREG) 12.5 MG tablet Take 12.5 mg by mouth 2 (two) times daily with a meal.   Yes [provider]  clopidogrel (PLAVIX) 75 MG tablet Take 75 mg by mouth daily.   Yes [provider]  docusate sodium (COLACE) 100 MG capsule Take 100 mg by mouth daily.   Yes [provider]  furosemide (LASIX) 40 MG tablet Take 40 mg by mouth 2 (two) times daily.   Yes [provider]  insulin NPH Human (HUMULIN N,NOVOLIN N) 100 UNIT/ML injection Inject 70 Units into the skin at bedtime.   Yes [provider]  insulin regular (NOVOLIN R,HUMULIN R) 100 units/mL injection Inject 20 Units into the skin 3 (three) times daily before meals.   Yes [provider]  isosorbide mononitrate (IMDUR) 30 MG 24 hr tablet Take 30 mg by mouth daily.   Yes [provider]  losartan (COZAAR) 25 MG tablet Take 25 mg by mouth daily.   Yes [provider]  nitroGLYCERIN (NITROSTAT) 0.4 MG SL tablet Place 0.4 mg under the tongue every 5 (five) minutes as needed for chest pain.   Yes [provider]  oxyCODONE-acetaminophen (PERCOCET/ROXICET) 5-325 MG tablet Take 1 tablet by mouth every 4 (four) hours as needed for severe pain.   Yes [provider]  potassium chloride (K-DUR,KLOR-CON) 10 MEQ tablet Take 10 mEq by mouth daily.   Yes [provider]  Vitamin D, Ergocalciferol, (DRISDOL) 50000 units CAPS capsule Take 50,000 Units by mouth every 30 (thirty) days.    Yes [provider]    Physical Exam: Vitals:   11/25/16 1749 11/25/16 2345 11/26/16 0000 11/26/16 0015  BP: (!) 175/67 (!) 172/68 (!) 175/65 (!) 157/70  Pulse: 83 81 85 81  Resp: 17     Temp: 99.7 F (37.6 C)     TempSrc: Oral     SpO2: 98% 98% 100% 95%  Weight:      Height:           Constitutional: NAD, calm, in apparent discomfort.  Eyes: PERTLA, lids and conjunctivae normal ENMT: Mucous membranes are moist. Posterior pharynx clear of any exudate or lesions.   Neck: normal, supple, no masses, no thyromegaly Respiratory: Slightly diminished bilaterally. No wheeze or rales. Normal respiratory effort.    Cardiovascular: S1 & S2 heard, regular rate and rhythm. No significant JVD. Abdomen: Mild distension, soft, no tenderness. Bowel sounds active.  Musculoskeletal: no clubbing / cyanosis. No joint deformity upper and lower extremities.    Skin: Distal 3rd left toe is atrophic and blackened with scant bloody drainage at proximal aspect. Skin is otherwise warm, dry, well-perfused. Neurologic: CN 2-12 grossly  intact. Sensation intact, DTR normal. Strength 5/5 in all 4 limbs.  Psychiatric: Alert and oriented x 3. Calm, cooperative.     Labs on Admission: I have personally reviewed following labs and imaging studies  CBC:  Recent Labs Lab 11/25/16 1455  WBC 10.8*  NEUTROABS 8.1*  HGB 11.6*  HCT 34.6*  MCV 95.1  PLT 211   Basic Metabolic Panel:  Recent Labs Lab 11/25/16 1455  NA 136  K 3.6  CL 98*  CO2 26  GLUCOSE 264*  BUN 48*  CREATININE 6.57*  CALCIUM 8.9   GFR: Estimated Creatinine Clearance: 11 mL/min (A) (by C-G formula based on SCr of 6.57 mg/dL (H)). Liver Function Tests:  Recent Labs Lab 11/25/16 1455  AST 22  ALT 10*  ALKPHOS 58  BILITOT 0.6  PROT 6.6  ALBUMIN 2.6*   No results for input(s): LIPASE, AMYLASE in the last 168 hours. No results for input(s): AMMONIA in the last 168 hours. Coagulation Profile: No results for input(s): INR, PROTIME in the last 168 hours. Cardiac Enzymes: No results for input(s): CKTOTAL, CKMB, CKMBINDEX, TROPONINI in the last 168 hours. BNP (last 3 results) No results for input(s): PROBNP in the last 8760 hours. HbA1C: No results for input(s): HGBA1C in the last 72 hours. CBG: No results for  input(s): GLUCAP in the last 168 hours. Lipid Profile: No results for input(s): CHOL, HDL, LDLCALC, TRIG, CHOLHDL, LDLDIRECT in the last 72 hours. Thyroid Function Tests: No results for input(s): TSH, T4TOTAL, FREET4, T3FREE, THYROIDAB in the last 72 hours. Anemia Panel: No results for input(s): VITAMINB12, FOLATE, FERRITIN, TIBC, IRON, RETICCTPCT in the last 72 hours. Urine analysis:    Component Value Date/Time   COLORURINE YELLOW 09/21/2015 1218   APPEARANCEUR HAZY (A) 09/21/2015 1218   LABSPEC 1.020 09/21/2015 1218   PHURINE 5.5 09/21/2015 1218   GLUCOSEU 250 (A) 09/21/2015 1218   HGBUR MODERATE (A) 09/21/2015 1218   BILIRUBINUR NEGATIVE 09/21/2015 1218   KETONESUR NEGATIVE 09/21/2015 1218   PROTEINUR >300 (A) 09/21/2015 1218   NITRITE NEGATIVE 09/21/2015 1218   LEUKOCYTESUR NEGATIVE 09/21/2015 1218   Sepsis Labs: @LABRCNTIP (procalcitonin:4,lacticidven:4) )No results found for this or any previous visit (from the past 240 hour(s)).   Radiological Exams on Admission: Dg Foot Complete Left  Result Date: 11/25/2016 CLINICAL DATA:  76 year old male with leaking pain of the foot. EXAM: LEFT FOOT - COMPLETE 3+ VIEW COMPARISON:  None. FINDINGS: There is no evidence of acute fracture or dislocation. There is diffuse osteopenia and degenerative changes limiting the evaluation for subtle underlying bony abnormalities. Within these limitations, no focal osseous findings are identified. Soft tissues are grossly unremarkable. IMPRESSION: No focal osseous abnormalities in the setting of diffuse osteopenia and degenerative changes. Please note, plain-film radiography has limited sensitivity for the detection of early osteomyelitis. Electronically Signed   By: Sande Brothers M.D.   On: 11/25/2016 15:29    EKG: Not performed.    Assessment/Plan  1. Gangrene of left 3rd toe  - Pt presents with painful and blackened toe  - Reports an ulcer at dorsal aspect of the toe, appearing stable for 3-4  wks before rapid worsening - There is a mild leukocytosis on presentation, no fever, normal lactate  - Plain radiographs with diffuse osteopenia, no focal bony abnormality  - He was treated in ED with empiric Zosyn  - Vascular surgery is consulting and much appreciated, will follow-up on recommendations  - Plan to trend inflammatory markers, continue empiric abx with Rocephin and Flagyl,  provide supportive care with prn analgesia   2. PAD, CAD - Pt has hx of CABG, reports outpt LE vascular study with severe PAD  - No anginal complaints on admission  - Continue ASA, Plavix, Imdur, Coreg, Lipitor, losartan    3. ESRD - Manages at home with PD  - No indications for urgent dialysis on admission  - Nephrology contacted on admission  - SLIV, fluid-restricted renal diet, continue binders   4. Insulin-dependent DM  - A1c was 9.4% in June 2017  - Managed at home with Humulin N 70 units qHS, and Novolin R 20 units TID  - Plan to check CBG with meals and qHS  - Start Lantus 50 units qHS with Novolog 10 units TID with meals, and high-intensity Novolog correctional    DVT prophylaxis: sq heparin  Code Status: Full  Family Communication: Wife updated at bedside Disposition Plan: Admit to med-surg Consults called: Vascular surgery  Admission status: Inpatient    Briscoe Deutscher, MD [facility] Pager [phone]  If 7PM-7AM, please contact night-coverage [email or web address] Password [facility]  11/26/2016, 12:51 AM

## 2016-11-26 NOTE — ED Notes (Signed)
IV nurse at bedside.

## 2016-11-26 NOTE — Progress Notes (Signed)
11/25/2016 10:04 PM  11/26/2016 9:00 AM  [name] was seen and examined.  The H&P by the admitting provider, orders, imaging was reviewed.  Please see new orders.  Will continue to follow.   Maryln Manuel, MD [facility]

## 2016-11-26 NOTE — Consult Note (Signed)
 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 male, who Was sent to the emergency department by his doctor in [place] for an acute change in the appearance of his left third toe.  He has had an ulcer on the stove for approximately 4 weeks.  Overnight it became black.  He had ABIs in [place] never an 0.4 range.  The patient is on peritoneal dialysis at home.  He recently had a defibrillator placed at [facility].  He was started on antibiotics in the emergency department and admitted for further evaluation.  He suffers and coronary artery disease and is status post CABG in 2011.  He takes Plavix and aspirin.  He is on a statin for hypercholesterolemia is medically managed for hypertension.  PAST MEDICAL HISTORY    Past Medical History:  Diagnosis Date  . CHF (congestive heart failure) (HCC)   . COPD (chronic obstructive pulmonary disease) (HCC)   . Coronary artery disease    CABG 2011  . Diabetes mellitus without complication (HCC)   . ESRD on peritoneal dialysis (HCC)    Started PD Dec 2016 in [place]. Nephrology is in [place].   . History of peritonitis    PD cath related peritonitis in April 2017  . Hypertension   . Sjogren's syndrome (HCC)    Per pt's wife, was diagnosed in 2016 during w/u for cause of renal failure prior to starting dialysis.  He had a rash on his chest apparently prompting this w/u.  Never had renal bx.  He was referred to a rheum MD in [place] and per the wife he was treated with MTX and other medications but had side effects to "all of it" and isn't taking anything for this as of Jun 2017.   . Stroke (HCC)      FAMILY HISTORY   History reviewed. No pertinent family history.  SOCIAL HISTORY:    Social History   Social History  . Marital status: Married    Spouse name: N/A  . Number of children: N/A  . Years of education: N/A   Occupational History  . Not on file.   Social History Main Topics  . Smoking status: Former Smoker  . Smokeless tobacco: Not on file  . Alcohol use No  . Drug use: No  . Sexual activity: Not on file   Other Topics Concern  . Not on file   Social History Narrative  . No narrative on file    ALLERGIES:    Allergies  Allergen Reactions  . Lisinopril     hives  . Methotrexate Derivatives     blisters  . Sulfa Antibiotics     hives  . Tape     Hives   . Vancomycin     Blisters     CURRENT MEDICATIONS:    Current Facility-Administered Medications  Medication Dose Route Frequency Provider Last Rate Last Dose  . 0.9 %  sodium chloride infusion  250 mL Intravenous PRN Opyd, Timothy S, MD      . acetaminophen (TYLENOL) tablet 650 mg  650 mg Oral Q6H PRN Opyd, Timothy S, MD       Or  . acetaminophen (TYLENOL) suppository 650 mg  650 mg Rectal Q6H   PRN Opyd, Timothy S, MD      . aspirin EC tablet 81 mg  81 mg Oral Daily Opyd, Timothy S, MD   81 mg at 11/26/16 0937  . atorvastatin (LIPITOR) tablet 80 mg  80 mg Oral q1800 Opyd, Timothy S, MD      . bisacodyl (DULCOLAX) EC tablet 5 mg  5 mg Oral Daily PRN Opyd, Timothy S, MD      . calcium acetate (PHOSLO) capsule 667 mg  667 mg Oral TID WC Opyd, Timothy S, MD   667 mg at 11/26/16 0937  . carvedilol (COREG) tablet 12.5 mg  12.5 mg Oral BID WC Opyd, Timothy S, MD   12.5 mg at 11/26/16 0937  . cefTRIAXone (ROCEPHIN) 1 g in dextrose 5 % 50 mL IVPB  1 g Intravenous Daily Opyd, Timothy S, MD   Stopped at 11/26/16 0711  . clopidogrel (PLAVIX) tablet 75 mg  75 mg Oral Daily Opyd, Timothy S, MD   75 mg at 11/26/16 0937  . docusate sodium (COLACE) capsule 100 mg  100 mg Oral Daily Opyd, Timothy S, MD   100 mg at 11/26/16 0937  . furosemide (LASIX) tablet 40 mg  40 mg Oral BID Opyd, Timothy S,  MD   40 mg at 11/26/16 0937  . heparin injection 5,000 Units  5,000 Units Subcutaneous Q8H Opyd, Timothy S, MD      . hydrALAZINE (APRESOLINE) injection 10 mg  10 mg Intravenous Q4H PRN Opyd, Timothy S, MD   10 mg at 11/26/16 0816  . insulin aspart (novoLOG) injection 0-20 Units  0-20 Units Subcutaneous TID WC Opyd, Timothy S, MD   4 Units at 11/26/16 0800  . insulin aspart (novoLOG) injection 0-5 Units  0-5 Units Subcutaneous QHS Opyd, Timothy S, MD      . insulin aspart (novoLOG) injection 10 Units  10 Units Subcutaneous TID WC Opyd, Timothy S, MD   10 Units at 11/26/16 0800  . insulin glargine (LANTUS) injection 50 Units  50 Units Subcutaneous QHS Opyd, Timothy S, MD   50 Units at 11/26/16 0259  . isosorbide mononitrate (IMDUR) 24 hr tablet 30 mg  30 mg Oral Daily Opyd, Timothy S, MD   30 mg at 11/26/16 0937  . losartan (COZAAR) tablet 25 mg  25 mg Oral Daily Opyd, Timothy S, MD   25 mg at 11/26/16 0937  . metroNIDAZOLE (FLAGYL) IVPB 500 mg  500 mg Intravenous Q8H Opyd, Timothy S, MD   Stopped at 11/26/16 0520  . nitroGLYCERIN (NITROSTAT) SL tablet 0.4 mg  0.4 mg Sublingual Q5 min PRN Opyd, Timothy S, MD      . oxyCODONE-acetaminophen (PERCOCET/ROXICET) 5-325 MG per tablet 1-2 tablet  1-2 tablet Oral Q4H PRN Opyd, Timothy S, MD      . potassium chloride (K-DUR,KLOR-CON) CR tablet 10 mEq  10 mEq Oral Daily Opyd, Timothy S, MD   10 mEq at 11/26/16 0937  . senna-docusate (Senokot-S) tablet 1 tablet  1 tablet Oral QHS PRN Opyd, Timothy S, MD      . sodium chloride flush (NS) 0.9 % injection 3 mL  3 mL Intravenous Q12H Opyd, Timothy S, MD      . sodium chloride flush (NS) 0.9 % injection 3 mL  3 mL Intravenous PRN Opyd, Timothy S, MD        REVIEW OF SYSTEMS:   [X] denotes positive finding, [ ] denotes negative finding Cardiac  Comments:  Chest pain or chest pressure:      Shortness of breath upon exertion:    Short of breath when lying flat:    Irregular heart rhythm:        Vascular    Pain  in calf, thigh, or hip brought on by ambulation:    Pain in feet at night that wakes you up from your sleep:     Blood clot in your veins:    Leg swelling:         Pulmonary    Oxygen at home:    Productive cough:     Wheezing:         Neurologic    Sudden weakness in arms or legs:     Sudden numbness in arms or legs:     Sudden onset of difficulty speaking or slurred speech:    Temporary loss of vision in one eye:     Problems with dizziness:         Gastrointestinal    Blood in stool:      Vomited blood:         Genitourinary    Burning when urinating:     Blood in urine:        Psychiatric    Major depression:         Hematologic    Bleeding problems:    Problems with blood clotting too easily:        Skin    Rashes or ulcers:        Constitutional    Fever or chills:     PHYSICAL EXAM:   Vitals:   11/26/16 0015 11/26/16 0100 11/26/16 0246 11/26/16 0500  BP: (!) 157/70 (!) 177/69 (!) 176/64 (!) 166/59  Pulse: 81 99 88 91  Resp:   16 18  Temp:   98.3 F (36.8 C) 99.3 F (37.4 C)  TempSrc:   Oral Oral  SpO2: 95% 98% 100% 94%  Weight:   200 lb (90.7 kg) 202 lb (91.6 kg)  Height:        GENERAL: The patient is a well-nourished male, in no acute distress. The vital signs are documented above. CARDIAC: There is a regular rate and rhythm.  VASCULAR: Nonpalpable pedal pulses PULMONARY: Nonlabored respirations ABDOMEN: Soft and non-tender with normal pitched bowel sounds.  MUSCULOSKELETAL: There are no major deformities or cyanosis. NEUROLOGIC: No focal weakness or paresthesias are detected. SKIN: Dry gangrene to left third toe. PSYCHIATRIC: The patient has a normal affect.  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  We discussed proceeding with angiography on Tuesday.  From my perspective he can be discharged and come back as an outpatient.  If he is discharged, I would like him to go home on oral antibiotics.   Wells Jaxon Mynhier, MD [facility]  [facility] [phone] Pager [phone] 

## 2016-11-26 NOTE — Consult Note (Signed)
[name] is an 76 y.o. male referred by Dr Laural Benes   Chief Complaint: ESRD, peritoneal dialysis HPI: 75yo WM with ESRD on CCPD thru nephrology in [place] was told to come to ER by his podiatrist due to gangrenous Lt 3rd toe.  He was seen at [facility] earlier in the week but says they did nothing.  He also had defibrillator place at [facility] earlier in the week. Overall his care seems to be quite spread out.  He does CCPD, 5 overnight exchanges ( dwell 1hr95min, fill , drain ) with daytime dwell that he empties 3hr later and then goes dry rest of day.  Fill volume 2500cc with 2000cc for last fill.  Past Medical History:  Diagnosis Date  . CHF (congestive heart failure) (HCC)   . COPD (chronic obstructive pulmonary disease) (HCC)   . Coronary artery disease    CABG 2011  . Diabetes mellitus without complication (HCC)   . ESRD on peritoneal dialysis [facility])    Started PD Dec 2016 in [place]. Nephrology is in [place].   [name] History of peritonitis    PD cath related peritonitis in April 2017  . Hypertension   . Sjogren's syndrome (HCC)    Per pt's wife, was diagnosed in 2016 during w/u for cause of renal failure prior to starting dialysis.  He had a rash on his chest apparently prompting this w/u.  Never had renal bx.  He was referred to a rheum MD in [place] and per the wife he was treated with MTX and other medications but had side effects to "all of it" and isn't taking anything for this as of Jun 2017.   . Stroke [facility])     Past Surgical History:  Procedure Laterality Date  . cardiac catherization with stent placement  2017  . CORONARY ARTERY BYPASS GRAFT  2011    History reviewed. No pertinent family history. Social History:  reports that he has quit smoking. He does not have any smokeless tobacco history on file. He reports that he does not drink alcohol or use drugs.  Allergies:  Allergies  Allergen Reactions  . Lisinopril     hives  . Methotrexate Derivatives      blisters  . Sulfa Antibiotics     hives  . Tape     Hives   . Vancomycin     Blisters     Medications Prior to Admission  Medication Sig Dispense Refill  . amoxicillin (AMOXIL) 500 MG capsule Take 2,000 mg by mouth See admin instructions. 1 hour prior to dental procedures    . aspirin EC 81 MG tablet Take 81 mg by mouth daily.    [name] atorvastatin (LIPITOR) 80 MG tablet Take 80 mg by mouth daily at 6 PM.    . calcium acetate (PHOSLO) 667 MG capsule Take 667 mg by mouth 3 (three) times daily with meals.    . carvedilol (COREG) 12.5 MG tablet Take 12.5 mg by mouth 2 (two) times daily with a meal.    . clopidogrel (PLAVIX) 75 MG tablet Take 75 mg by mouth daily.    [name] docusate sodium (COLACE) 100 MG capsule Take 100 mg by mouth daily.    . furosemide (LASIX) 40 MG tablet Take 40 mg by mouth 2 (two) times daily.    . insulin NPH Human (HUMULIN N,NOVOLIN N) 100 UNIT/ML injection Inject 70 Units into the skin at bedtime.    . insulin regular (NOVOLIN R,HUMULIN R) 100 units/mL injection Inject  20 Units into the skin 3 (three) times daily before meals.    . isosorbide mononitrate (IMDUR) 30 MG 24 hr tablet Take 30 mg by mouth daily.    [name] losartan (COZAAR) 25 MG tablet Take 25 mg by mouth daily.    . nitroGLYCERIN (NITROSTAT) 0.4 MG SL tablet Place 0.4 mg under the tongue every 5 (five) minutes as needed for chest pain.    [name] oxyCODONE-acetaminophen (PERCOCET/ROXICET) 5-325 MG tablet Take 1 tablet by mouth every 4 (four) hours as needed for severe pain.    . potassium chloride (K-DUR,KLOR-CON) 10 MEQ tablet Take 10 mEq by mouth daily.    . Vitamin D, Ergocalciferol, (DRISDOL) 50000 units CAPS capsule Take 50,000 Units by mouth every 30 (thirty) days.        Lab Results: UA: ND  Recent Labs  11/25/16 1455 11/26/16 0550  WBC 10.8* 9.4  HGB 11.6* 10.6*  HCT 34.6* 31.4*  PLT 211 188   BMET  Recent Labs  11/25/16 1455 11/26/16 0550  NA 136 137  K 3.6 3.2*  CL 98* 100*  CO2 26 23   GLUCOSE 264* 177*  BUN 48* 53*  CREATININE 6.57* 6.70*  CALCIUM 8.9 8.5*   LFT  Recent Labs  11/25/16 1455  PROT 6.6  ALBUMIN 2.6*  AST 22  ALT 10*  ALKPHOS 58  BILITOT 0.6   Dg Foot Complete Left  Result Date: 11/25/2016 CLINICAL DATA:  76 year old male with leaking pain of the foot. EXAM: LEFT FOOT - COMPLETE 3+ VIEW COMPARISON:  None. FINDINGS: There is no evidence of acute fracture or dislocation. There is diffuse osteopenia and degenerative changes limiting the evaluation for subtle underlying bony abnormalities. Within these limitations, no focal osseous findings are identified. Soft tissues are grossly unremarkable. IMPRESSION: No focal osseous abnormalities in the setting of diffuse osteopenia and degenerative changes. Please note, plain-film radiography has limited sensitivity for the detection of early osteomyelitis. Electronically Signed   By: Sande Brothers M.D.   On: 11/25/2016 15:29    ROS: Appetite good No SOB  No CP No change in bowels Mild pain Lt foot No dysuria  PHYSICAL EXAM: Blood pressure (!) 166/59, pulse 91, temperature 99.3 F (37.4 C), temperature source Oral, resp. rate 18, height 5\' 9"  (1.753 m), weight 91.6 kg (202 lb), SpO2 94 %. HEENT: PERRLA EOMI NECK:No JVD LUNGS:Clear CARDIAC:RRR wo MRG ABD:+ BS NTND PD exit site LUQ no erythema or pus EXT:tr edema LLE   LUA AVF + bruit  Gangrenous Lt 3rd toe NEURO:CNI Ox3 No asterixis  Assessment: 1. 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 DM 3. ESRD 4. Hx CAD PLAN: 1. He says he may be going home today and if that is the case he should resume PD tonight.  If stays then will arrange it for here but let us know.  Discussed with Dr Laural Benes.   Oather Muilenburg T 11/26/2016, 11:01 AM

## 2016-11-26 NOTE — ED Notes (Signed)
5N nurse Sharron informed of IV teams attempts and current access.

## 2016-11-28 ENCOUNTER — Other Ambulatory Visit: Payer: Self-pay

## 2016-11-28 LAB — GLUCOSE, CAPILLARY: Glucose-Capillary: 192 mg/dL — ABNORMAL HIGH (ref 65–99)

## 2016-11-29 ENCOUNTER — Encounter (HOSPITAL_COMMUNITY): Payer: Self-pay | Admitting: Surgery

## 2016-11-29 ENCOUNTER — Ambulatory Visit (HOSPITAL_COMMUNITY): Payer: Medicare Other | Admitting: Certified Registered"

## 2016-11-29 ENCOUNTER — Ambulatory Visit: Admit: 2016-11-29 | Payer: Medicare Other | Admitting: Surgery

## 2016-11-29 ENCOUNTER — Encounter (HOSPITAL_COMMUNITY)
Admission: RE | Disposition: A | Discharge status: Home / Self Care | Payer: Self-pay | Source: Ambulatory Visit | Attending: Surgery

## 2016-11-29 ENCOUNTER — Ambulatory Visit (HOSPITAL_COMMUNITY)
Admission: RE | Admit: 2016-11-29 | Discharge: 2016-11-30 | Disposition: A | Discharge status: Home / Self Care | Payer: Medicare Other | Source: Ambulatory Visit | Attending: Surgery | Admitting: Surgery

## 2016-11-29 DIAGNOSIS — Z882 Allergy status to sulfonamides status: Secondary | ICD-10-CM | POA: Insufficient documentation

## 2016-11-29 DIAGNOSIS — Z87891 Personal history of nicotine dependence: Secondary | ICD-10-CM | POA: Insufficient documentation

## 2016-11-29 DIAGNOSIS — J449 Chronic obstructive pulmonary disease, unspecified: Secondary | ICD-10-CM | POA: Diagnosis not present

## 2016-11-29 DIAGNOSIS — Z8673 Personal history of transient ischemic attack (TIA), and cerebral infarction without residual deficits: Secondary | ICD-10-CM | POA: Insufficient documentation

## 2016-11-29 DIAGNOSIS — Z95810 Presence of automatic (implantable) cardiac defibrillator: Secondary | ICD-10-CM | POA: Diagnosis not present

## 2016-11-29 DIAGNOSIS — Z881 Allergy status to other antibiotic agents status: Secondary | ICD-10-CM | POA: Insufficient documentation

## 2016-11-29 DIAGNOSIS — E7800 Pure hypercholesterolemia, unspecified: Secondary | ICD-10-CM | POA: Insufficient documentation

## 2016-11-29 DIAGNOSIS — Z888 Allergy status to other drugs, medicaments and biological substances status: Secondary | ICD-10-CM | POA: Insufficient documentation

## 2016-11-29 DIAGNOSIS — D649 Anemia, unspecified: Secondary | ICD-10-CM | POA: Diagnosis not present

## 2016-11-29 DIAGNOSIS — Z951 Presence of aortocoronary bypass graft: Secondary | ICD-10-CM | POA: Diagnosis not present

## 2016-11-29 DIAGNOSIS — Z992 Dependence on renal dialysis: Secondary | ICD-10-CM | POA: Diagnosis not present

## 2016-11-29 DIAGNOSIS — I96 Gangrene, not elsewhere classified: Secondary | ICD-10-CM | POA: Insufficient documentation

## 2016-11-29 DIAGNOSIS — Z794 Long term (current) use of insulin: Secondary | ICD-10-CM | POA: Insufficient documentation

## 2016-11-29 DIAGNOSIS — E1151 Type 2 diabetes mellitus with diabetic peripheral angiopathy without gangrene: Secondary | ICD-10-CM | POA: Insufficient documentation

## 2016-11-29 DIAGNOSIS — I132 Hypertensive heart and chronic kidney disease with heart failure and with stage 5 chronic kidney disease, or end stage renal disease: Secondary | ICD-10-CM | POA: Diagnosis not present

## 2016-11-29 DIAGNOSIS — I739 Peripheral vascular disease, unspecified: Secondary | ICD-10-CM | POA: Diagnosis present

## 2016-11-29 DIAGNOSIS — Z7902 Long term (current) use of antithrombotics/antiplatelets: Secondary | ICD-10-CM | POA: Diagnosis not present

## 2016-11-29 DIAGNOSIS — I509 Heart failure, unspecified: Secondary | ICD-10-CM | POA: Diagnosis not present

## 2016-11-29 DIAGNOSIS — Z791 Long term (current) use of non-steroidal anti-inflammatories (NSAID): Secondary | ICD-10-CM | POA: Insufficient documentation

## 2016-11-29 DIAGNOSIS — Z7982 Long term (current) use of aspirin: Secondary | ICD-10-CM | POA: Diagnosis not present

## 2016-11-29 DIAGNOSIS — E1122 Type 2 diabetes mellitus with diabetic chronic kidney disease: Secondary | ICD-10-CM | POA: Insufficient documentation

## 2016-11-29 DIAGNOSIS — N186 End stage renal disease: Secondary | ICD-10-CM | POA: Diagnosis not present

## 2016-11-29 DIAGNOSIS — I2510 Atherosclerotic heart disease of native coronary artery without angina pectoris: Secondary | ICD-10-CM | POA: Insufficient documentation

## 2016-11-29 DIAGNOSIS — Z79899 Other long term (current) drug therapy: Secondary | ICD-10-CM | POA: Insufficient documentation

## 2016-11-29 DIAGNOSIS — Z79891 Long term (current) use of opiate analgesic: Secondary | ICD-10-CM | POA: Insufficient documentation

## 2016-11-29 HISTORY — PX: PERIPHERAL VASCULAR BALLOON ANGIOPLASTY: CATH118281

## 2016-11-29 HISTORY — PX: AMPUTATION: SHX166

## 2016-11-29 HISTORY — PX: ABDOMINAL AORTOGRAM W/LOWER EXTREMITY: CATH118223

## 2016-11-29 LAB — GLUCOSE, CAPILLARY
GLUCOSE-CAPILLARY: 286 mg/dL — AB (ref 65–99)
GLUCOSE-CAPILLARY: 176 mg/dL — AB (ref 65–99)
Glucose-Capillary: 417 mg/dL — ABNORMAL HIGH (ref 65–99)
Glucose-Capillary: 330 mg/dL — ABNORMAL HIGH (ref 65–99)
Glucose-Capillary: 289 mg/dL — ABNORMAL HIGH (ref 65–99)
Glucose-Capillary: 204 mg/dL — ABNORMAL HIGH (ref 65–99)

## 2016-11-29 LAB — POCT I-STAT, CHEM 8
BUN: 52 mg/dL — AB (ref 6–20)
CALCIUM ION: 1.02 mmol/L — AB (ref 1.15–1.40)
Chloride: 95 mmol/L — ABNORMAL LOW (ref 101–111)
Creatinine, Ser: 5.8 mg/dL — ABNORMAL HIGH (ref 0.61–1.24)
GLUCOSE: 379 mg/dL — AB (ref 65–99)
HCT: 30 % — ABNORMAL LOW (ref 39.0–52.0)
Hemoglobin: 10.2 g/dL — ABNORMAL LOW (ref 13.0–17.0)
Potassium: 4.3 mmol/L (ref 3.5–5.1)
SODIUM: 136 mmol/L (ref 135–145)
TCO2: 30 mmol/L (ref 0–100)

## 2016-11-29 LAB — POCT ACTIVATED CLOTTING TIME
ACTIVATED CLOTTING TIME: 175 s
Activated Clotting Time: 224 seconds
Activated Clotting Time: 224 seconds

## 2016-11-29 SURGERY — ABDOMINAL AORTOGRAM W/LOWER EXTREMITY
Anesthesia: LOCAL

## 2016-11-29 SURGERY — AMPUTATION, FOOT, RAY
Anesthesia: Monitor Anesthesia Care | Site: Toe | Laterality: Left

## 2016-11-29 MED ORDER — MEPERIDINE HCL 25 MG/ML IJ SOLN: 6.2500 mg | INTRAMUSCULAR | Status: DC | PRN

## 2016-11-29 MED ORDER — PROPOFOL 10 MG/ML IV BOLUS
INTRAVENOUS | Status: DC | PRN
  Administered 2016-11-29: 30 mg via INTRAVENOUS
  Administered 2016-11-29: 20 mg via INTRAVENOUS

## 2016-11-29 MED ORDER — PROPOFOL 500 MG/50ML IV EMUL
INTRAVENOUS | Status: DC | PRN
  Administered 2016-11-29: 25 ug/kg/min via INTRAVENOUS

## 2016-11-29 MED ORDER — CHLORHEXIDINE GLUCONATE 0.12 % MT SOLN
15.0000 mL | Freq: Two times a day (BID) | OROMUCOSAL | Status: DC
  Administered 2016-11-30: 15 mL via OROMUCOSAL
  Filled 2016-11-29: qty 15

## 2016-11-29 MED ORDER — HEPARIN SODIUM (PORCINE) 1000 UNIT/ML IJ SOLN
INTRAMUSCULAR | Status: AC
  Filled 2016-11-29: qty 1

## 2016-11-29 MED ORDER — VITAMIN D (ERGOCALCIFEROL) 1.25 MG (50000 UNIT) PO CAPS: 50000.0000 [IU] | ORAL_CAPSULE | ORAL | Status: DC

## 2016-11-29 MED ORDER — CLOPIDOGREL BISULFATE 75 MG PO TABS
75.0000 mg | ORAL_TABLET | Freq: Every day | ORAL | Status: DC
  Administered 2016-11-30: 75 mg via ORAL
  Filled 2016-11-29: qty 1

## 2016-11-29 MED ORDER — ONDANSETRON HCL 4 MG/2ML IJ SOLN
INTRAMUSCULAR | Status: DC | PRN
  Administered 2016-11-29: 4 mg via INTRAVENOUS

## 2016-11-29 MED ORDER — LIDOCAINE HCL (PF) 1 % IJ SOLN
INTRAMUSCULAR | Status: AC
  Filled 2016-11-29: qty 30

## 2016-11-29 MED ORDER — ACETAMINOPHEN 325 MG PO TABS: 650.0000 mg | ORAL_TABLET | ORAL | Status: DC | PRN

## 2016-11-29 MED ORDER — 0.9 % SODIUM CHLORIDE (POUR BTL) OPTIME
TOPICAL | Status: DC | PRN
  Administered 2016-11-29: 1000 mL

## 2016-11-29 MED ORDER — AMOXICILLIN 500 MG PO CAPS: 2000.0000 mg | ORAL_CAPSULE | ORAL | Status: DC

## 2016-11-29 MED ORDER — ROPIVACAINE HCL 5 MG/ML IJ SOLN
INTRAMUSCULAR | Status: DC | PRN
  Administered 2016-11-29: 40 mL via PERINEURAL

## 2016-11-29 MED ORDER — OXYCODONE-ACETAMINOPHEN 5-325 MG PO TABS
1.0000 | ORAL_TABLET | ORAL | Status: DC | PRN
  Administered 2016-11-30 (×2): 1 via ORAL
  Filled 2016-11-29 (×2): qty 1

## 2016-11-29 MED ORDER — CARVEDILOL 12.5 MG PO TABS
12.5000 mg | ORAL_TABLET | Freq: Two times a day (BID) | ORAL | Status: DC
  Administered 2016-11-29 – 2016-11-30 (×2): 12.5 mg via ORAL
  Filled 2016-11-29 (×2): qty 1

## 2016-11-29 MED ORDER — DOCUSATE SODIUM 100 MG PO CAPS
100.0000 mg | ORAL_CAPSULE | Freq: Every day | ORAL | Status: DC
  Administered 2016-11-30: 100 mg via ORAL
  Filled 2016-11-29: qty 1

## 2016-11-29 MED ORDER — SODIUM CHLORIDE 0.9 % IV SOLN
INTRAVENOUS | Status: DC | PRN
  Administered 2016-11-29: 13:00:00 via INTRAVENOUS

## 2016-11-29 MED ORDER — HEPARIN 1000 UNIT/ML FOR PERITONEAL DIALYSIS
INTRAPERITONEAL | Status: DC | PRN
  Filled 2016-11-29: qty 5000

## 2016-11-29 MED ORDER — MIDAZOLAM HCL 2 MG/2ML IJ SOLN
INTRAMUSCULAR | Status: DC | PRN
  Administered 2016-11-29 (×3): 1 mg via INTRAVENOUS

## 2016-11-29 MED ORDER — SODIUM CHLORIDE 0.9 % IV SOLN: INTRAVENOUS | Status: DC

## 2016-11-29 MED ORDER — HEPARIN SODIUM (PORCINE) 1000 UNIT/ML IJ SOLN
INTRAMUSCULAR | Status: DC | PRN
  Administered 2016-11-29: 1000 [IU] via INTRAVENOUS
  Administered 2016-11-29: 9000 [IU] via INTRAVENOUS

## 2016-11-29 MED ORDER — ATORVASTATIN CALCIUM 80 MG PO TABS
80.0000 mg | ORAL_TABLET | Freq: Every day | ORAL | Status: DC
  Administered 2016-11-29: 80 mg via ORAL
  Filled 2016-11-29: qty 1

## 2016-11-29 MED ORDER — INSULIN ASPART 100 UNIT/ML ~~LOC~~ SOLN
SUBCUTANEOUS | Status: AC
  Administered 2016-11-29: 10 [IU] via SUBCUTANEOUS
  Filled 2016-11-29: qty 1

## 2016-11-29 MED ORDER — LABETALOL HCL 5 MG/ML IV SOLN: 10.0000 mg | INTRAVENOUS | Status: DC | PRN

## 2016-11-29 MED ORDER — FUROSEMIDE 40 MG PO TABS
40.0000 mg | ORAL_TABLET | Freq: Two times a day (BID) | ORAL | Status: DC
  Administered 2016-11-29 – 2016-11-30 (×2): 40 mg via ORAL
  Filled 2016-11-29 (×2): qty 1

## 2016-11-29 MED ORDER — LIDOCAINE 2% (20 MG/ML) 5 ML SYRINGE
INTRAMUSCULAR | Status: DC | PRN
  Administered 2016-11-29: 50 mg via INTRAVENOUS

## 2016-11-29 MED ORDER — POTASSIUM CHLORIDE CRYS ER 10 MEQ PO TBCR
10.0000 meq | EXTENDED_RELEASE_TABLET | Freq: Every day | ORAL | Status: DC
  Administered 2016-11-29 – 2016-11-30 (×2): 10 meq via ORAL
  Filled 2016-11-29 (×2): qty 1

## 2016-11-29 MED ORDER — LOSARTAN POTASSIUM 25 MG PO TABS
25.0000 mg | ORAL_TABLET | Freq: Every day | ORAL | Status: DC
  Administered 2016-11-29: 25 mg via ORAL
  Filled 2016-11-29 (×2): qty 1

## 2016-11-29 MED ORDER — HEPARIN (PORCINE) IN NACL 2-0.9 UNIT/ML-% IJ SOLN
INTRAMUSCULAR | Status: AC
  Filled 2016-11-29: qty 1000

## 2016-11-29 MED ORDER — MIDAZOLAM HCL 2 MG/2ML IJ SOLN
INTRAMUSCULAR | Status: AC
  Filled 2016-11-29: qty 2

## 2016-11-29 MED ORDER — NITROGLYCERIN 0.4 MG SL SUBL: 0.4000 mg | SUBLINGUAL_TABLET | SUBLINGUAL | Status: DC | PRN

## 2016-11-29 MED ORDER — SODIUM CHLORIDE 0.9 % IV SOLN: 250.0000 mL | INTRAVENOUS | Status: DC | PRN

## 2016-11-29 MED ORDER — ASPIRIN EC 81 MG PO TBEC
81.0000 mg | DELAYED_RELEASE_TABLET | Freq: Every day | ORAL | Status: DC
  Administered 2016-11-30: 81 mg via ORAL
  Filled 2016-11-29: qty 1

## 2016-11-29 MED ORDER — HEPARIN 1000 UNIT/ML FOR PERITONEAL DIALYSIS: 500.0000 [IU] | INTRAMUSCULAR | Status: DC | PRN

## 2016-11-29 MED ORDER — DOXYCYCLINE HYCLATE 100 MG PO TABS
100.0000 mg | ORAL_TABLET | Freq: Two times a day (BID) | ORAL | Status: DC
  Administered 2016-11-29 – 2016-11-30 (×2): 100 mg via ORAL
  Filled 2016-11-29 (×2): qty 1

## 2016-11-29 MED ORDER — CALCIUM ACETATE (PHOS BINDER) 667 MG PO CAPS
667.0000 mg | ORAL_CAPSULE | Freq: Three times a day (TID) | ORAL | Status: DC
  Administered 2016-11-29 – 2016-11-30 (×3): 667 mg via ORAL
  Filled 2016-11-29 (×3): qty 1

## 2016-11-29 MED ORDER — HEPARIN (PORCINE) IN NACL 2-0.9 UNIT/ML-% IJ SOLN
INTRAMUSCULAR | Status: AC | PRN
  Administered 2016-11-29: 1000 mL

## 2016-11-29 MED ORDER — LIDOCAINE HCL (PF) 1 % IJ SOLN
INTRAMUSCULAR | Status: DC | PRN
  Administered 2016-11-29: 18 mL

## 2016-11-29 MED ORDER — CEFAZOLIN SODIUM-DEXTROSE 2-4 GM/100ML-% IV SOLN
INTRAVENOUS | Status: AC
  Filled 2016-11-29: qty 100

## 2016-11-29 MED ORDER — GENTAMICIN SULFATE 0.1 % EX CREA
1.0000 "application " | TOPICAL_CREAM | Freq: Every day | CUTANEOUS | Status: DC
  Administered 2016-11-29 – 2016-11-30 (×2): 1 via TOPICAL
  Filled 2016-11-29: qty 15

## 2016-11-29 MED ORDER — CEFAZOLIN SODIUM-DEXTROSE 2-4 GM/100ML-% IV SOLN
2.0000 g | Freq: Once | INTRAVENOUS | Status: AC
  Administered 2016-11-29: 2 g via INTRAVENOUS

## 2016-11-29 MED ORDER — ISOSORBIDE MONONITRATE ER 30 MG PO TB24
30.0000 mg | ORAL_TABLET | Freq: Every day | ORAL | Status: DC
  Administered 2016-11-30: 30 mg via ORAL
  Filled 2016-11-29: qty 1

## 2016-11-29 MED ORDER — ONDANSETRON HCL 4 MG/2ML IJ SOLN: 4.0000 mg | Freq: Four times a day (QID) | INTRAMUSCULAR | Status: DC | PRN

## 2016-11-29 MED ORDER — FENTANYL CITRATE (PF) 100 MCG/2ML IJ SOLN
INTRAMUSCULAR | Status: DC | PRN
  Administered 2016-11-29: 100 ug via INTRAVENOUS

## 2016-11-29 MED ORDER — SODIUM CHLORIDE 0.9% FLUSH: 3.0000 mL | INTRAVENOUS | Status: DC | PRN

## 2016-11-29 MED ORDER — MIDAZOLAM HCL 2 MG/2ML IJ SOLN: 0.5000 mg | Freq: Once | INTRAMUSCULAR | Status: DC | PRN

## 2016-11-29 MED ORDER — INSULIN ASPART 100 UNIT/ML ~~LOC~~ SOLN
10.0000 [IU] | Freq: Once | SUBCUTANEOUS | Status: AC
  Administered 2016-11-29: 10 [IU] via SUBCUTANEOUS

## 2016-11-29 MED ORDER — FENTANYL CITRATE (PF) 100 MCG/2ML IJ SOLN
INTRAMUSCULAR | Status: AC
  Administered 2016-11-29: 25 ug via INTRAVENOUS
  Filled 2016-11-29: qty 2

## 2016-11-29 MED ORDER — SODIUM CHLORIDE 0.9% FLUSH
3.0000 mL | Freq: Two times a day (BID) | INTRAVENOUS | Status: DC
  Administered 2016-11-29 – 2016-11-30 (×2): 3 mL via INTRAVENOUS

## 2016-11-29 MED ORDER — ORAL CARE MOUTH RINSE: 15.0000 mL | Freq: Two times a day (BID) | OROMUCOSAL | Status: DC

## 2016-11-29 MED ORDER — SODIUM CHLORIDE 0.9 % IV SOLN
INTRAVENOUS | Status: AC | PRN
  Administered 2016-11-29: 10 mL/h via INTRAVENOUS

## 2016-11-29 MED ORDER — HYDRALAZINE HCL 20 MG/ML IJ SOLN: 5.0000 mg | INTRAMUSCULAR | Status: DC | PRN

## 2016-11-29 MED ORDER — DELFLEX-LC/2.5% DEXTROSE 394 MOSM/L IP SOLN
Freq: Every day | INTRAPERITONEAL | Status: DC
  Administered 2016-11-29: 22:00:00 via INTRAPERITONEAL

## 2016-11-29 MED ORDER — FENTANYL CITRATE (PF) 250 MCG/5ML IJ SOLN
INTRAMUSCULAR | Status: AC
  Filled 2016-11-29: qty 5

## 2016-11-29 MED ORDER — FENTANYL CITRATE (PF) 100 MCG/2ML IJ SOLN
INTRAMUSCULAR | Status: AC
  Filled 2016-11-29: qty 2

## 2016-11-29 MED ORDER — FENTANYL CITRATE (PF) 100 MCG/2ML IJ SOLN
INTRAMUSCULAR | Status: DC | PRN
  Administered 2016-11-29 (×3): 25 ug via INTRAVENOUS

## 2016-11-29 MED ORDER — FENTANYL CITRATE (PF) 100 MCG/2ML IJ SOLN
25.0000 ug | INTRAMUSCULAR | Status: DC | PRN
  Administered 2016-11-29 (×2): 25 ug via INTRAVENOUS

## 2016-11-29 MED ORDER — PROMETHAZINE HCL 25 MG/ML IJ SOLN: 6.2500 mg | INTRAMUSCULAR | Status: DC | PRN

## 2016-11-29 SURGICAL SUPPLY — 22 items
BALLN STERLING OTW 3X60X150 (BALLOONS) ×3
BALLOON STERLING OTW 3X60X150 (BALLOONS) ×2 IMPLANT
CATH ANGIO 5F BER2 100CM (CATHETERS) ×3 IMPLANT
CATH OMNI FLUSH 5F 65CM (CATHETERS) ×3 IMPLANT
CATH QUICKCROSS .018X135CM (MICROCATHETER) ×3 IMPLANT
COVER PRB 48X5XTLSCP FOLD TPE (BAG) ×2 IMPLANT
COVER PROBE 5X48 (BAG) ×1
DEVICE CONTINUOUS FLUSH (MISCELLANEOUS) ×3 IMPLANT
DEVICE TORQUE H2O (MISCELLANEOUS) ×3 IMPLANT
DRAPE ZERO GRAVITY STERILE (DRAPES) ×3 IMPLANT
GUIDEWIRE ANGLED .035X150CM (WIRE) ×3 IMPLANT
KIT ENCORE 26 ADVANTAGE (KITS) ×3 IMPLANT
KIT MICROINTRODUCER STIFF 5F (SHEATH) ×3 IMPLANT
KIT PV (KITS) ×3 IMPLANT
SHEATH PINNACLE 5F 10CM (SHEATH) ×3 IMPLANT
SHEATH PINNACLE ST 6F 65CM (SHEATH) ×3 IMPLANT
SHIELD RADPAD SCOOP 12X17 (MISCELLANEOUS) ×3 IMPLANT
SYR MEDRAD MARK V 150ML (SYRINGE) ×3 IMPLANT
TRANSDUCER W/STOPCOCK (MISCELLANEOUS) ×3 IMPLANT
TRAY PV CATH (CUSTOM PROCEDURE TRAY) ×3 IMPLANT
WIRE BENTSON .035X145CM (WIRE) ×3 IMPLANT
WIRE G V18X300CM (WIRE) ×3 IMPLANT

## 2016-11-29 SURGICAL SUPPLY — 37 items
BANDAGE ACE 4X5 VEL STRL LF (GAUZE/BANDAGES/DRESSINGS) ×2 IMPLANT
BANDAGE ACE 6X5 VEL STRL LF (GAUZE/BANDAGES/DRESSINGS) IMPLANT
BANDAGE ELASTIC 4 VELCRO ST LF (GAUZE/BANDAGES/DRESSINGS) ×2 IMPLANT
BLADE LONG MED 31X9 (MISCELLANEOUS) IMPLANT
BLADE SAW SGTL 81X20 HD (BLADE) ×2 IMPLANT
BNDG GAUZE ELAST 4 BULKY (GAUZE/BANDAGES/DRESSINGS) ×2 IMPLANT
CANISTER SUCT 3000ML PPV (MISCELLANEOUS) ×2 IMPLANT
CLIP VESOCCLUDE MED 6/CT (CLIP) IMPLANT
COVER SURGICAL LIGHT HANDLE (MISCELLANEOUS) ×2 IMPLANT
DRAPE HALF SHEET 40X57 (DRAPES) IMPLANT
DRSG ADAPTIC 3X8 NADH LF (GAUZE/BANDAGES/DRESSINGS) ×2 IMPLANT
ELECT REM PT RETURN 9FT ADLT (ELECTROSURGICAL) ×2
ELECTRODE REM PT RTRN 9FT ADLT (ELECTROSURGICAL) ×1 IMPLANT
GAUZE SPONGE 4X4 12PLY STRL (GAUZE/BANDAGES/DRESSINGS) ×2 IMPLANT
GAUZE SPONGE 4X4 12PLY STRL LF (GAUZE/BANDAGES/DRESSINGS) ×2 IMPLANT
GAUZE SPONGE 4X4 16PLY XRAY LF (GAUZE/BANDAGES/DRESSINGS) IMPLANT
GLOVE BIO SURGEON STRL SZ 6.5 (GLOVE) ×2 IMPLANT
GLOVE BIOGEL PI IND STRL 6.5 (GLOVE) ×2 IMPLANT
GLOVE BIOGEL PI IND STRL 7.5 (GLOVE) ×1 IMPLANT
GLOVE BIOGEL PI INDICATOR 6.5 (GLOVE) ×2
GLOVE BIOGEL PI INDICATOR 7.5 (GLOVE) ×1
GLOVE SURG SS PI 7.5 STRL IVOR (GLOVE) ×2 IMPLANT
GOWN STRL REUS W/ TWL LRG LVL3 (GOWN DISPOSABLE) ×2 IMPLANT
GOWN STRL REUS W/ TWL XL LVL3 (GOWN DISPOSABLE) ×1 IMPLANT
GOWN STRL REUS W/TWL LRG LVL3 (GOWN DISPOSABLE) ×2
GOWN STRL REUS W/TWL XL LVL3 (GOWN DISPOSABLE) ×1
KIT BASIN OR (CUSTOM PROCEDURE TRAY) ×2 IMPLANT
KIT ROOM TURNOVER OR (KITS) ×2 IMPLANT
NS IRRIG 1000ML POUR BTL (IV SOLUTION) ×2 IMPLANT
PACK GENERAL/GYN (CUSTOM PROCEDURE TRAY) ×2 IMPLANT
PAD ARMBOARD 7.5X6 YLW CONV (MISCELLANEOUS) ×4 IMPLANT
SUT BONE WAX W31G (SUTURE) IMPLANT
SUT ETHILON 3 0 PS 1 (SUTURE) ×4 IMPLANT
TOWEL GREEN STERILE (TOWEL DISPOSABLE) ×2 IMPLANT
TOWEL GREEN STERILE FF (TOWEL DISPOSABLE) ×2 IMPLANT
UNDERPAD 30X30 (UNDERPADS AND DIAPERS) ×2 IMPLANT
WATER STERILE IRR 1000ML POUR (IV SOLUTION) ×2 IMPLANT

## 2016-11-29 NOTE — Anesthesia Procedure Notes (Signed)
Procedure Name: MAC Date/Time: 11/29/2016 1:54 PM Performed by: Orlie Dakin Pre-anesthesia Checklist: Patient identified, Emergency Drugs available, Suction available, Patient being monitored and Timeout performed Oxygen Delivery Method: Simple face mask

## 2016-11-29 NOTE — Progress Notes (Signed)
Spoke with patient about BiPAP QHS. Patient has home machine and wife can set it up. Provided patient with sterile water and instructed him and his wife to have the nurse call respiratory if they needed anything further assistance.

## 2016-11-29 NOTE — H&P (View-Only) (Signed)
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 male, who Was sent to the emergency department by his doctor in [place] for an acute change in the appearance of his left third toe.  He has had an ulcer on the stove for approximately 4 weeks.  Overnight it became black.  He had ABIs in [place] never an 0.4 range.  The patient is on peritoneal dialysis at home.  He recently had a defibrillator placed at [facility].  He was started on antibiotics in the emergency department and admitted for further evaluation.  He suffers and coronary artery disease and is status post CABG in 2011.  He takes Plavix and aspirin.  He is on a statin for hypercholesterolemia is medically managed for hypertension.  PAST MEDICAL HISTORY    Past Medical History:  Diagnosis Date  . CHF (congestive heart failure) (HCC)   . COPD (chronic obstructive pulmonary disease) (HCC)   . Coronary artery disease    CABG 2011  . Diabetes mellitus without complication (HCC)   . ESRD on peritoneal dialysis [facility])    Started PD Dec 2016 in [place]. Nephrology is in [place].   [name] History of peritonitis    PD cath related peritonitis in April 2017  . Hypertension   . Sjogren's syndrome (HCC)    Per pt's wife, was diagnosed in 2016 during w/u for cause of renal failure prior to starting dialysis.  He had a rash on his chest apparently prompting this w/u.  Never had renal bx.  He was referred to a rheum MD in [place] and per the wife he was treated with MTX and other medications but had side effects to "all of it" and isn't taking anything for this as of Jun 2017.   . Stroke [facility])      FAMILY HISTORY   History reviewed. No pertinent family history.  SOCIAL HISTORY:    Social History   Social History  . Marital status: Married    Spouse name: N/A  . Number of children: N/A  . Years of education: N/A   Occupational History  . Not on file.   Social History Main Topics  . Smoking status: Former Games developer  . Smokeless tobacco: Not on file  . Alcohol use No  . Drug use: No  . Sexual activity: Not on file   Other Topics Concern  . Not on file   Social History Narrative  . No narrative on file    ALLERGIES:    Allergies  Allergen Reactions  . Lisinopril     hives  . Methotrexate Derivatives     blisters  . Sulfa Antibiotics     hives  . Tape     Hives   . Vancomycin     Blisters     CURRENT MEDICATIONS:    Current Facility-Administered Medications  Medication Dose Route Frequency Provider Last Rate Last Dose  . 0.9 %  sodium chloride infusion  250 mL Intravenous PRN Opyd, Lavone Neri, MD      . acetaminophen (TYLENOL) tablet 650 mg  650 mg Oral Q6H PRN Opyd, Lavone Neri, MD       Or  . acetaminophen (TYLENOL) suppository 650 mg  650 mg Rectal Q6H  PRN Briscoe Deutscher, MD      . aspirin EC tablet 81 mg  81 mg Oral Daily Opyd, Lavone Neri, MD   81 mg at 11/26/16 4696  . atorvastatin (LIPITOR) tablet 80 mg  80 mg Oral q1800 Opyd, Lavone Neri, MD      . bisacodyl (DULCOLAX) EC tablet 5 mg  5 mg Oral Daily PRN Opyd, Lavone Neri, MD      . calcium acetate (PHOSLO) capsule 667 mg  667 mg Oral TID WC Opyd, Lavone Neri, MD   667 mg at 11/26/16 0937  . carvedilol (COREG) tablet 12.5 mg  12.5 mg Oral BID WC Opyd, Lavone Neri, MD   12.5 mg at 11/26/16 0937  . cefTRIAXone (ROCEPHIN) 1 g in dextrose 5 % 50 mL IVPB  1 g Intravenous Daily Opyd, Lavone Neri, MD   Stopped at 11/26/16 [phone]  . clopidogrel (PLAVIX) tablet 75 mg  75 mg Oral Daily Opyd, Lavone Neri, MD   75 mg at 11/26/16 8413  . docusate sodium (COLACE) capsule 100 mg  100 mg Oral Daily Opyd, Lavone Neri, MD   100 mg at 11/26/16 2440  . furosemide (LASIX) tablet 40 mg  40 mg Oral BID Briscoe Deutscher,  MD   40 mg at 11/26/16 1027  . heparin injection 5,000 Units  5,000 Units Subcutaneous Q8H Opyd, Lavone Neri, MD      . hydrALAZINE (APRESOLINE) injection 10 mg  10 mg Intravenous Q4H PRN Opyd, Lavone Neri, MD   10 mg at 11/26/16 0816  . insulin aspart (novoLOG) injection 0-20 Units  0-20 Units Subcutaneous TID WC Opyd, Lavone Neri, MD   4 Units at 11/26/16 0800  . insulin aspart (novoLOG) injection 0-5 Units  0-5 Units Subcutaneous QHS Opyd, Timothy S, MD      . insulin aspart (novoLOG) injection 10 Units  10 Units Subcutaneous TID WC Opyd, Lavone Neri, MD   10 Units at 11/26/16 0800  . insulin glargine (LANTUS) injection 50 Units  50 Units Subcutaneous QHS Briscoe Deutscher, MD   50 Units at 11/26/16 0259  . isosorbide mononitrate (IMDUR) 24 hr tablet 30 mg  30 mg Oral Daily Opyd, Lavone Neri, MD   30 mg at 11/26/16 0937  . losartan (COZAAR) tablet 25 mg  25 mg Oral Daily Opyd, Lavone Neri, MD   25 mg at 11/26/16 2536  . metroNIDAZOLE (FLAGYL) IVPB 500 mg  500 mg Intravenous Q8H Opyd, Lavone Neri, MD   Stopped at 11/26/16 0520  . nitroGLYCERIN (NITROSTAT) SL tablet 0.4 mg  0.4 mg Sublingual Q5 min PRN Opyd, Lavone Neri, MD      . oxyCODONE-acetaminophen (PERCOCET/ROXICET) 5-325 MG per tablet 1-2 tablet  1-2 tablet Oral Q4H PRN Opyd, Lavone Neri, MD      . potassium chloride (K-DUR,KLOR-CON) CR tablet 10 mEq  10 mEq Oral Daily Opyd, Lavone Neri, MD   10 mEq at 11/26/16 0937  . senna-docusate (Senokot-S) tablet 1 tablet  1 tablet Oral QHS PRN Opyd, Lavone Neri, MD      . sodium chloride flush (NS) 0.9 % injection 3 mL  3 mL Intravenous Q12H Opyd, Timothy S, MD      . sodium chloride flush (NS) 0.9 % injection 3 mL  3 mL Intravenous PRN Opyd, Lavone Neri, MD        REVIEW OF SYSTEMS:   [X]  denotes positive finding, [ ]  denotes negative finding Cardiac  Comments:  Chest pain or chest pressure:  Shortness of breath upon exertion:    Short of breath when lying flat:    Irregular heart rhythm:        Vascular    Pain  in calf, thigh, or hip brought on by ambulation:    Pain in feet at night that wakes you up from your sleep:     Blood clot in your veins:    Leg swelling:         Pulmonary    Oxygen at home:    Productive cough:     Wheezing:         Neurologic    Sudden weakness in arms or legs:     Sudden numbness in arms or legs:     Sudden onset of difficulty speaking or slurred speech:    Temporary loss of vision in one eye:     Problems with dizziness:         Gastrointestinal    Blood in stool:      Vomited blood:         Genitourinary    Burning when urinating:     Blood in urine:        Psychiatric    Major depression:         Hematologic    Bleeding problems:    Problems with blood clotting too easily:        Skin    Rashes or ulcers:        Constitutional    Fever or chills:     PHYSICAL EXAM:   Vitals:   11/26/16 0015 11/26/16 0100 11/26/16 0246 11/26/16 0500  BP: (!) 157/70 (!) 177/69 (!) 176/64 (!) 166/59  Pulse: 81 99 88 91  Resp:   16 18  Temp:   98.3 F (36.8 C) 99.3 F (37.4 C)  TempSrc:   Oral Oral  SpO2: 95% 98% 100% 94%  Weight:   200 lb (90.7 kg) 202 lb (91.6 kg)  Height:        GENERAL: The patient is a well-nourished male, in no acute distress. The vital signs are documented above. CARDIAC: There is a regular rate and rhythm.  VASCULAR: Nonpalpable pedal pulses PULMONARY: Nonlabored respirations ABDOMEN: Soft and non-tender with normal pitched bowel sounds.  MUSCULOSKELETAL: There are no major deformities or cyanosis. NEUROLOGIC: No focal weakness or paresthesias are detected. SKIN: Dry gangrene to left third toe. PSYCHIATRIC: The patient has a normal affect.  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  We discussed proceeding with angiography on Tuesday.  From my perspective he can be discharged and come back as an outpatient.  If he is discharged, I would like him to go home on oral antibiotics.   Durene Cal, MD [facility] [phone] Pager [phone]

## 2016-11-29 NOTE — Op Note (Signed)
    Patient name: [name] MRN: [id number] DOB: 1940/11/26 Sex: male  11/29/2016 Pre-operative Diagnosis: Left third toe gangrene Post-operative diagnosis:  Same Surgeon:  Durene Cal Assistants:  None Procedure:   Left third toe ray amputation Anesthesia:  Ankle block Blood Loss:  See anesthesia record Specimens:  None  Findings:  Marginal capillary bleeding  Indications:  Patient presented over the weekend with a gangrenous left third toe.  He underwent percutaneous revascularization of the occluded peroneal artery.  I discussed proceeding with toe amputation.  They understand the risk of nonhealing and the need for more proximal amputation.  Procedure:  The patient was identified in the holding area and taken to [facility] OR ROOM 12  The patient was then placed supine on the table. Ankle block anesthesia was administered.  The patient was prepped and draped in the usual sterile fashion.  A time out was called and antibiotics were administered.  A fishmouth incision was made at the base of the left third toe.  The incision was carried down to the bone and large bone cutters were used to transect the bone.  Aundria Rud were used to debris back to the proximal phalanx.  There was minimal capillary bleeding.  Will was irrigated and then closed with interrupted 3-0 nylon.  Sterile dressings were applied   Disposition:  To PACU stable   V. Durene Cal, M.D. [facility] [place] Office: [phone] Pager:  [phone]

## 2016-11-29 NOTE — Op Note (Addendum)
Patient name: [name] MRN: [id number] DOB: 1940-10-06 Sex: male  11/29/2016 Pre-operative Diagnosis: Left third toe gangrene Post-operative diagnosis:  Same Surgeon:  Annamarie Major Procedure Performed:  1.  Ultrasound-guided access, right femoral artery  2.  Abdominal aortogram  3.  Bilateral lower extremity runoff  4.  Angioplasty, left tibioperoneal trunk and peroneal artery  5.  Failed angioplasty left anterior tibial artery  6.  Conscious sedation (79 minutes)     Indications:  The patient presented to the hospital with dry gangrene of his left third toe.  Vascular studies indicate arterial insufficiency.  He is here today for further evaluation and possible intervention  Procedure:  The patient was identified in the holding area and taken to room 8.  The patient was then placed supine on the table and prepped and draped in the usual sterile fashion.  A time out was called.  Conscious sedation was administered with use of IV fentanyl and Versed under continuous physician nurse monitoring.  Heart rate, blood pressure, and oxygen saturations were cutaneous a monitored.  Ultrasound was used to evaluate the right common femoral artery.  It was patent .  A digital ultrasound image was acquired.  A micropuncture needle was used to access the right common femoral artery under ultrasound guidance.  An 018 wire was advanced without resistance and a micropuncture sheath was placed.  The 018 wire was removed and a benson wire was placed.  The micropuncture sheath was exchanged for a 5 french sheath.  An omniflush catheter was advanced over the wire to the level of L-1.  An abdominal angiogram was obtained.  Next, using the omniflush catheter and a benson wire, the aortic bifurcation was crossed and the catheter was placed into theleft external iliac artery and left runoff was obtained.  right runoff was performed via retrograde sheath injections.  Findings:   Aortogram:  No significant  renal artery stenosis.  The infrarenal abdominal aorta is widely patent.  Bilateral common iliac arteries are widely patent.  Bilateral external iliac arteries are widely patent  Right Lower Extremity:  The right common femoral, profunda femoral and superficial femoral artery are patent throughout their course.  The popliteal artery is widely patent.  There is single vessel runoff via the peroneal artery  Left Lower Extremity:  The left common femoral, profunda femoral, and superficial femoral artery are patent throughout their course.  The popliteal artery is widely patent.  There is single vessel runoff via the peroneal artery, however the peroneal artery is occluded at its origin  Intervention:  After the above images were acquired the decision was made to proceed with intervention over a 035 wire, a 6 French 65 cm sheath was advanced into the left superficial femoral artery.  The patient was fully heparinized.  I used a 018 quick cross and a V 18 wire to successfully cross the occlusion within the proximal peroneal artery.  Catheter injection was performed confirming successful crossing of the lesion.  The the-18 wire was then replaced.  I selected a 3 x 60 stent curling balloon and perform balloon angioplasty of the area taking the balloon to 10 atm for 2 minutes.  Follow-up imaging revealed resolution of the stenosis.  There is one area with luminal narrowing distal to this that I reinflated the balloon across again for 2 minutes at 10 atm.  Follow-up imaging revealed resolution of the stenosis.  I then used a Berenstein 2 catheter to select the anterior tibial artery which  is patent at its origin.  I used a quick cross catheter and the the-18 wire to attempt recanalization of the occluded anterior tibial artery, however I was unable to stay within the artery and could not cross the occlusion after multiple attempts, I elected to terminate the procedure.  Catheters and wires were removed.  The patient be  taken holding area for sheath pull.  His sheath was withdrawn to the right external iliac artery.  There were no immediate complications  Impression:  #1  occlusion of the origin of the left peroneal artery which was able to be successfully crossed and dilated with a 3 mm balloon  #2  failed attempt at recanalization of a long segment anterior tibial occlusion on the left  #3  single vessel runoff on the right via the peroneal artery.  #4  no significant inflow or outflow stenosis identified     V. Annamarie Major, M.D. [facility] Office: [phone] Pager:  [phone]

## 2016-11-29 NOTE — Progress Notes (Signed)
Dr Sandford Craze aware CBG 289=no new orders. Pt has pre-existing peritoneal dialysis cath;clamped, dsg is CDI.

## 2016-11-29 NOTE — Transfer of Care (Signed)
Immediate Anesthesia Transfer of Care Note  Patient: [name]  Procedure(s) Performed: Procedure(s): LEFT THIRD TOE AMPUTATION (Left)  Patient Location: PACU  Anesthesia Type:MAC combined with regional for post-op pain  Level of Consciousness: awake, alert  and patient cooperative  Airway & Oxygen Therapy: Patient Spontanous Breathing and Patient connected to nasal cannula oxygen  Post-op Assessment: Report given to RN and Post -op Vital signs reviewed and stable  Post vital signs: Reviewed and stable  Last Vitals:  Vitals:   11/29/16 1248 11/29/16 1419  BP: (!) 166/77   Pulse: 84   Resp: 10   Temp:  36.6 C  SpO2: 93%     Last Pain:  Vitals:   11/29/16 1236  TempSrc:   PainSc: 1          Complications: No apparent anesthesia complications

## 2016-11-29 NOTE — Interval H&P Note (Signed)
History and Physical Interval Note:  11/29/2016 10:02 AM  [name]  has presented today for surgery, with the diagnosis of pvd with gaingrene of left third toe  The various methods of treatment have been discussed with the patient and family. After consideration of risks, benefits and other options for treatment, the patient has consented to  Procedure(s): ABDOMINAL AORTOGRAM W/LOWER EXTREMITY (N/A) as a surgical intervention .  The patient's history has been reviewed, patient examined, no change in status, stable for surgery.  I have reviewed the patient's chart and labs.  Questions were answered to the patient's satisfaction.     Durene Cal

## 2016-11-29 NOTE — Anesthesia Procedure Notes (Signed)
Anesthesia Regional Block: Ankle block   Pre-Anesthetic Checklist: ,, timeout performed, Correct Patient, Correct Site, Correct Laterality, Correct Procedure, Correct Position, site marked, Risks and benefits discussed,  Surgical consent,  Pre-op evaluation,  At surgeon's request and post-op pain management  Laterality: Left and Lower  Prep: chloraprep       Needles:   Needle Type: Quincke     Needle Length: 4cm  Needle Gauge: 25     Additional Needles:   Narrative:  Start time: 11/29/2016 1:29 PM End time: 11/29/2016 1:36 PM Injection made incrementally with aspirations every 5 mL.  Performed by: Personally  Anesthesiologist: Jean Rosenthal, Brayan Votaw  Additional Notes: Pt identified in Holding room.  Monitors applied. Working IV access confirmed. Sterile prep L ankle.  #25ga perineural infiltration around deep and sup peroneal, saph, sural, post tibial nerves.  40cc 0.5% Ropivacaine injected incrementally after negative test dose.  Patient asymptomatic, VSS, no heme aspirated, tolerated well.  Sandford Craze, MD

## 2016-11-29 NOTE — Consult Note (Signed)
Reason for Consult: Manage peritoneal dialysis Referring Physician: [name]  Chief Complaint: Gangrene of 3rd digit on left foot  Assessment/Plan: 1. PAD - p/w gangrene of the 3rd digit in the left foot s/p ray amputation. 2. ESRD - on PD. Will resume CCPD @ home regimen. Some e/o volume overload but no dyspnea; therefore, will continue 2.5% Dianeal and monitor response. 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 4. Anemia - Stable and at goal. 5. HTN - Resume home meds; UF w/ PD will help as well. 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 7. COPD   HPI: [name] is an 76 y.o. male ESRD for 2 years on CCPD, 2.5% 2.5 liters fill volume 5 exchanges with 2 liter 6th fill and midday drain. He has always been on PD followed by Dr. Denny Peon in [place]. He is referred to [facility] to evaluate gangrene in the left foot 3rd digit w/ revascularization of the peroneal artery by [facility] but req a lt 3rd toe ray amputation; noted to be minimal capillary bldg. He currently denies any n/v/anorexia and has had a good appetite. He denies any dyspnea, cp, cough, f/c.  ROS Pertinent items are noted in HPI.  Chemistry and CBC: Creatinine, Ser  Date/Time Value Ref Range Status  11/29/2016 10:27 AM 5.80 (H) 0.61 - 1.24 mg/dL Final  09/62/8366 29:47 AM 6.70 (H) 0.61 - 1.24 mg/dL Final  65/46/5035 46:56 PM 6.57 (H) 0.61 - 1.24 mg/dL Final  81/27/5170 01:74 AM 8.40 (H) 0.61 - 1.24 mg/dL Final  94/49/6759 16:38 PM 9.03 (H) 0.61 - 1.24 mg/dL Final  46/65/9935 70:17 PM 8.97 (H) 0.61 - 1.24 mg/dL Final  79/39/0300 92:33 AM 9.67 (H) 0.61 - 1.24 mg/dL Final  00/76/2263 33:54 PM 10.31 (H) 0.61 - 1.24 mg/dL Final  56/25/6389 37:34 AM 10.20 (H) 0.61 - 1.24 mg/dL Final  28/76/8115 72:62 AM 10.33 (H) 0.61 - 1.24 mg/dL Final  03/55/9741 63:84 PM 9.58 (H) 0.61 - 1.24 mg/dL Final  53/64/6803 21:22 AM 8.86 (H) 0.61 - 1.24 mg/dL Final    Recent Labs Lab 11/25/16 1455 11/26/16 0550 11/29/16 1027  NA 136 137 136  K  3.6 3.2* 4.3  CL 98* 100* 95*  CO2 26 23  --   GLUCOSE 264* 177* 379*  BUN 48* 53* 52*  CREATININE 6.57* 6.70* 5.80*  CALCIUM 8.9 8.5*  --     Recent Labs Lab 11/25/16 1455 11/26/16 0550 11/29/16 1027  WBC 10.8* 9.4  --   NEUTROABS 8.1* 6.9  --   HGB 11.6* 10.6* 10.2*  HCT 34.6* 31.4* 30.0*  MCV 95.1 94.9  --   PLT 211 188  --    Liver Function Tests:  Recent Labs Lab 11/25/16 1455  AST 22  ALT 10*  ALKPHOS 58  BILITOT 0.6  PROT 6.6  ALBUMIN 2.6*   No results for input(s): LIPASE, AMYLASE in the last 168 hours. No results for input(s): AMMONIA in the last 168 hours. Cardiac Enzymes: No results for input(s): CKTOTAL, CKMB, CKMBINDEX, TROPONINI in the last 168 hours. Iron Studies: No results for input(s): IRON, TIBC, TRANSFERRIN, FERRITIN in the last 72 hours. PT/INR: @LABRCNTIP (inr:5)  Xrays/Other Studies: ) Results for orders placed or performed during the hospital encounter of 11/29/16 (from the past 48 hour(s))  Glucose, capillary     Status: Abnormal   Collection Time: 11/29/16  9:03 AM  Result Value Ref Range   Glucose-Capillary 417 (H) 65 - 99 mg/dL  I-STAT, chem 8  Status: Abnormal   Collection Time: 11/29/16 10:27 AM  Result Value Ref Range   Sodium 136 135 - 145 mmol/L   Potassium 4.3 3.5 - 5.1 mmol/L   Chloride 95 (L) 101 - 111 mmol/L   BUN 52 (H) 6 - 20 mg/dL   Creatinine, Ser 1.61 (H) 0.61 - 1.24 mg/dL   Glucose, Bld 096 (H) 65 - 99 mg/dL   Calcium, Ion 0.45 (L) 1.15 - 1.40 mmol/L   TCO2 30 0 - 100 mmol/L   Hemoglobin 10.2 (L) 13.0 - 17.0 g/dL   HCT 40.9 (L) 81.1 - 91.4 %  Glucose, capillary     Status: Abnormal   Collection Time: 11/29/16 11:45 AM  Result Value Ref Range   Glucose-Capillary 330 (H) 65 - 99 mg/dL   Comment 1 Notify RN    Comment 2 Document in Chart   POCT Activated clotting time     Status: None   Collection Time: 11/29/16 12:07 PM  Result Value Ref Range   Activated Clotting Time 224 seconds  POCT Activated  clotting time     Status: None   Collection Time: 11/29/16 12:34 PM  Result Value Ref Range   Activated Clotting Time 224 seconds  Glucose, capillary     Status: Abnormal   Collection Time: 11/29/16  1:28 PM  Result Value Ref Range   Glucose-Capillary 286 (H) 65 - 99 mg/dL  Glucose, capillary     Status: Abnormal   Collection Time: 11/29/16  2:19 PM  Result Value Ref Range   Glucose-Capillary 289 (H) 65 - 99 mg/dL  POCT Activated clotting time     Status: None   Collection Time: 11/29/16  3:14 PM  Result Value Ref Range   Activated Clotting Time 175 seconds  Glucose, capillary     Status: Abnormal   Collection Time: 11/29/16  5:38 PM  Result Value Ref Range   Glucose-Capillary 204 (H) 65 - 99 mg/dL   Comment 1 Notify RN    Comment 2 Document in Chart    No results found.  PMH:   Past Medical History:  Diagnosis Date  . CHF (congestive heart failure) (HCC)   . COPD (chronic obstructive pulmonary disease) (HCC)   . Coronary artery disease    CABG 2011  . Diabetes mellitus without complication (HCC)   . ESRD on peritoneal dialysis [facility])    Started PD Dec 2016 in [place]. Nephrology is in [place].   [name] History of peritonitis    PD cath related peritonitis in April 2017  . Hypertension   . Sjogren's syndrome (HCC)    Per pt's wife, was diagnosed in 2016 during w/u for cause of renal failure prior to starting dialysis.  He had a rash on his chest apparently prompting this w/u.  Never had renal bx.  He was referred to a rheum MD in [place] and per the wife he was treated with MTX and other medications but had side effects to "all of it" and isn't taking anything for this as of Jun 2017.   . Stroke [facility])     PSH:   Past Surgical History:  Procedure Laterality Date  . ABDOMINAL AORTOGRAM W/LOWER EXTREMITY N/A 11/29/2016   Procedure: ABDOMINAL AORTOGRAM W/LOWER EXTREMITY;  Surgeon: Nada Libman, MD;  Location: [facility] INVASIVE CV LAB;  Service: Cardiovascular;  Laterality:  N/A;  . cardiac catherization with stent placement  2017  . CORONARY ARTERY BYPASS GRAFT  2011  . PERIPHERAL VASCULAR BALLOON ANGIOPLASTY  11/29/2016   Procedure: PERIPHERAL VASCULAR BALLOON ANGIOPLASTY;  Surgeon: Nada Libman, MD;  Location: [facility] INVASIVE CV LAB;  Service: Cardiovascular;;  LT Peroneal AT attempted unsuccessful    Allergies:  Allergies  Allergen Reactions  . Lisinopril     hives  . Methotrexate Derivatives     blisters  . Sulfa Antibiotics     hives  . Tape     Hives   . Vancomycin     Blisters     Medications:   Prior to Admission medications   Medication Sig Start Date End Date Taking? Authorizing Provider  aspirin EC 81 MG tablet Take 81 mg by mouth daily.   Yes [provider]  atorvastatin (LIPITOR) 80 MG tablet Take 80 mg by mouth daily at 6 PM.   Yes [provider]  calcium acetate (PHOSLO) 667 MG capsule Take 667 mg by mouth 3 (three) times daily with meals.   Yes [provider]  carvedilol (COREG) 12.5 MG tablet Take 12.5 mg by mouth 2 (two) times daily with a meal.   Yes [provider]  clopidogrel (PLAVIX) 75 MG tablet Take 75 mg by mouth daily.   Yes [provider]  docusate sodium (COLACE) 100 MG capsule Take 100 mg by mouth daily.   Yes [provider]  furosemide (LASIX) 40 MG tablet Take 40 mg by mouth 2 (two) times daily.   Yes [provider]  insulin NPH Human (HUMULIN N,NOVOLIN N) 100 UNIT/ML injection Inject 70 Units into the skin at bedtime.   Yes [provider]  insulin regular (NOVOLIN R,HUMULIN R) 100 units/mL injection Inject 20 Units into the skin 3 (three) times daily before meals.   Yes [provider]  isosorbide mononitrate (IMDUR) 30 MG 24 hr tablet Take 30 mg by mouth daily.   Yes [provider]  losartan (COZAAR) 25 MG tablet Take 25 mg by mouth daily.   Yes [provider]  amoxicillin (AMOXIL) 500 MG capsule Take 2,000  mg by mouth See admin instructions. 1 hour prior to dental procedures    [provider]  doxycycline (VIBRAMYCIN) 100 MG capsule Take 1 capsule (100 mg total) by mouth 2 (two) times daily. 11/26/16   Johnson, Clanford L, MD  nitroGLYCERIN (NITROSTAT) 0.4 MG SL tablet Place 0.4 mg under the tongue every 5 (five) minutes as needed for chest pain.    [provider]  oxyCODONE-acetaminophen (PERCOCET/ROXICET) 5-325 MG tablet Take 1 tablet by mouth every 4 (four) hours as needed for severe pain.    [provider]  potassium chloride (K-DUR,KLOR-CON) 10 MEQ tablet Take 10 mEq by mouth daily.    [provider]  Vitamin D, Ergocalciferol, (DRISDOL) 50000 units CAPS capsule Take 50,000 Units by mouth every 30 (thirty) days.     [provider]    Discontinued Meds:   Medications Discontinued During This Encounter  Medication Reason  . 0.9 %  sodium chloride infusion   . heparin injection Patient Discharge  . lidocaine (PF) (XYLOCAINE) 1 % injection Patient Discharge  . midazolam (VERSED) injection Patient Discharge  . fentaNYL (SUBLIMAZE) injection Patient Discharge  . 0.9 % irrigation (POUR BTL) Patient Discharge  . meperidine (DEMEROL) injection 6.25-12.5 mg Patient Transfer  . midazolam (VERSED) injection 0.5-2 mg Patient Transfer  . promethazine (PHENERGAN) injection 6.25-12.5 mg Patient Transfer  . fentaNYL (SUBLIMAZE) injection 25-50 mcg Patient Transfer  . amoxicillin (AMOXIL) capsule 2,000 mg Expired Prescription    Social  History:  reports that he has quit smoking. He does not have any smokeless tobacco history on file. He reports that he does not drink alcohol or use drugs.  Family History:  No family history on file.  Blood pressure (!) 144/71, pulse 72, temperature 98.6 F (37 C), temperature source Oral, resp. rate 18, height 5\' 9"  (1.753 m), weight 93 kg (205 lb), SpO2 98 %. General appearance: alert, cooperative and appears stated  age Head: Normocephalic, without obvious abnormality, atraumatic Eyes: negative Neck: no adenopathy, no carotid bruit, no JVD, supple, symmetrical, trachea midline and thyroid not enlarged, symmetric, no tenderness/mass/nodules Back: symmetric, no curvature. ROM normal. No CVA tenderness. Resp: clear to auscultation bilaterally Chest wall: no tenderness Cardio: regular rate and rhythm, S1, S2 normal, no murmur, click, rub or gallop GI: soft, non-tender; bowel sounds normal; no masses,  no organomegaly Extremities: edema trace to 1+ Skin: Skin color, texture, turgor normal. No rashes or lesions Lymph nodes: Cervical, supraclavicular, and axillary nodes normal. Neurologic: Grossly normal       LIN, Len Blalock, MD 11/29/2016, 7:38 PM

## 2016-11-29 NOTE — Anesthesia Postprocedure Evaluation (Signed)
Anesthesia Post Note  Patient: [name]  Procedure(s) Performed: Procedure(s) (LRB): LEFT THIRD TOE AMPUTATION (Left)     Patient location during evaluation: PACU Anesthesia Type: Regional Level of consciousness: awake and alert, patient cooperative and oriented Pain management: pain level controlled Vital Signs Assessment: post-procedure vital signs reviewed and stable Respiratory status: spontaneous breathing, nonlabored ventilation and respiratory function stable Cardiovascular status: blood pressure returned to baseline and stable Postop Assessment: no signs of nausea or vomiting Anesthetic complications: no    Last Vitals:  Vitals:   11/29/16 1437 11/29/16 1454  BP: (!) 149/61   Pulse: 74 78  Resp: 16 14  Temp:    SpO2: 98% 97%    Last Pain:  Vitals:   11/29/16 1454  TempSrc:   PainSc: 0-No pain                 Darianny Momon,E. Kijuana Ruppel

## 2016-11-29 NOTE — Progress Notes (Signed)
75F Femoral  Arterial sheath pulled.  Manuel pressure held for 20 min.   Level 1 hematoma presheath pull.  No change noted post sheath, level 1  Right DP dopplered pre and post sheath pull.  Vitals stable.  Bed rest starts at 1540.  Post sheath pull instructions given to patient.  Patient's nurse assessed.  No complications.

## 2016-11-29 NOTE — Anesthesia Procedure Notes (Signed)
Procedure Name: MAC Date/Time: 11/29/2016 1:44 PM Performed by: Orlie Dakin Pre-anesthesia Checklist: Emergency Drugs available, Patient identified, Suction available, Patient being monitored and Timeout performed Patient Re-evaluated:Patient Re-evaluated prior to induction Oxygen Delivery Method: Nasal cannula Preoxygenation: Pre-oxygenation with 100% oxygen

## 2016-11-29 NOTE — H&P (Signed)
Spoke with patient and family.  We have decided to proceed with left 3rd toe amputation.  They understand the risk of more proximal amputation   Wells Preslee Regas

## 2016-11-29 NOTE — Anesthesia Preprocedure Evaluation (Signed)
Anesthesia Evaluation  Patient identified by MRN, date of birth, ID band Patient awake    Reviewed: Allergy & Precautions, NPO status , Patient's Chart, lab work & pertinent test results  Airway Mallampati: III  TM Distance: >3 FB     Dental  (+) Teeth Intact, Dental Advisory Given   Pulmonary sleep apnea and Continuous Positive Airway Pressure Ventilation , COPD, former smoker,    breath sounds clear to auscultation       Cardiovascular hypertension, Pt. on medications and Pt. on home beta blockers (-) angina+ CAD, + Cardiac Stents, + CABG and + Peripheral Vascular Disease  + Cardiac Defibrillator (put in last week, has never shocked )  Rhythm:Regular Rate:Normal  11/23/16 ECHO [facility]): MODERATE LV DYSFUNCTION (See above) WITH MILD LVH NORMAL RIGHT VENTRICULAR SYSTOLIC FUNCTION VALVULAR REGURGITATION: TRIVIAL AR, TRIVIAL MR, TRIVIAL PR, TRIVIAL TRNO VALVULAR STENOSIS   Neuro/Psych CVA, No Residual Symptoms    GI/Hepatic negative GI ROS, Neg liver ROS,   Endo/Other  diabetes, Insulin DependentMorbid obesitySjogren's  Renal/GU Dialysis and ESRFRenal disease (peritoneal)     Musculoskeletal   Abdominal (+) + obese,   Peds  Hematology negative hematology ROS (+)   Anesthesia Other Findings   Reproductive/Obstetrics                             Anesthesia Physical Anesthesia Plan  ASA: III  Anesthesia Plan: MAC and Regional   Post-op Pain Management:    Induction:   PONV Risk Score and Plan: 1 and Ondansetron and Treatment may vary due to age or medical condition  Airway Management Planned: Natural Airway  Additional Equipment:   Intra-op Plan:   Post-operative Plan:   Informed Consent: I have reviewed the patients History and Physical, chart, labs and discussed the procedure including the risks, benefits and alternatives for the proposed anesthesia with the patient or authorized  representative who has indicated his/her understanding and acceptance.   Dental advisory given  Plan Discussed with: CRNA and Surgeon  Anesthesia Plan Comments: (Plan routine monitors, Ankle block )        Anesthesia Quick Evaluation

## 2016-11-30 ENCOUNTER — Encounter (HOSPITAL_COMMUNITY): Payer: Self-pay | Admitting: *Deleted

## 2016-11-30 ENCOUNTER — Telehealth: Payer: Self-pay | Admitting: Surgery

## 2016-11-30 DIAGNOSIS — I96 Gangrene, not elsewhere classified: Secondary | ICD-10-CM | POA: Diagnosis not present

## 2016-11-30 LAB — BASIC METABOLIC PANEL
Anion gap: 12 (ref 5–15)
BUN: 32 mg/dL — ABNORMAL HIGH (ref 6–20)
CALCIUM: 8.2 mg/dL — AB (ref 8.9–10.3)
CHLORIDE: 99 mmol/L — AB (ref 101–111)
CO2: 25 mmol/L (ref 22–32)
CREATININE: 5.56 mg/dL — AB (ref 0.61–1.24)
GFR calc non Af Amer: 9 mL/min — ABNORMAL LOW (ref >60)
GFR, EST AFRICAN AMERICAN: 10 mL/min — AB (ref >60)
Glucose, Bld: 315 mg/dL — ABNORMAL HIGH (ref 65–99)
Potassium: 3.5 mmol/L (ref 3.5–5.1)
SODIUM: 136 mmol/L (ref 135–145)

## 2016-11-30 LAB — CBC
HCT: 30.7 % — ABNORMAL LOW (ref 39.0–52.0)
Hemoglobin: 10.1 g/dL — ABNORMAL LOW (ref 13.0–17.0)
MCH: 31.2 pg (ref 26.0–34.0)
MCHC: 32.9 g/dL (ref 30.0–36.0)
MCV: 94.8 fL (ref 78.0–100.0)
PLATELETS: 255 10*3/uL (ref 150–400)
RBC: 3.24 MIL/uL — AB (ref 4.22–5.81)
RDW: 13 % (ref 11.5–15.5)
WBC: 8 10*3/uL (ref 4.0–10.5)

## 2016-11-30 LAB — GLUCOSE, CAPILLARY
GLUCOSE-CAPILLARY: 423 mg/dL — AB (ref 65–99)
GLUCOSE-CAPILLARY: 346 mg/dL — AB (ref 65–99)

## 2016-11-30 LAB — PHOSPHORUS: PHOSPHORUS: 5 mg/dL — AB (ref 2.5–4.6)

## 2016-11-30 MED ORDER — INSULIN ASPART 100 UNIT/ML ~~LOC~~ SOLN: 0.0000 [IU] | Freq: Every day | SUBCUTANEOUS | Status: DC

## 2016-11-30 MED ORDER — INSULIN ASPART 100 UNIT/ML ~~LOC~~ SOLN
0.0000 [IU] | Freq: Three times a day (TID) | SUBCUTANEOUS | Status: DC
  Administered 2016-11-30: 11 [IU] via SUBCUTANEOUS
  Administered 2016-11-30: 15 [IU] via SUBCUTANEOUS

## 2016-11-30 MED ORDER — OXYCODONE-ACETAMINOPHEN 5-325 MG PO TABS: 1.0000 | ORAL_TABLET | Freq: Four times a day (QID) | ORAL | 0 refills | Status: DC | PRN

## 2016-11-30 MED ORDER — DOXYCYCLINE HYCLATE 100 MG PO CAPS: 100.0000 mg | ORAL_CAPSULE | Freq: Two times a day (BID) | ORAL | 0 refills | Status: AC

## 2016-11-30 NOTE — Telephone Encounter (Signed)
-----   Message from Sharee Pimple, RN sent at 11/30/2016 12:18 PM EDT ----- Regarding: 2 weeks postop   ----- Message ----- From: Otis Peak Sent: 11/30/2016  11:12 AM To: [facility] Charge Pool  S/p aortogram, anglioplasty left tibioperoneal trunk and peroneal artery, failed angioplasty left anterior tibial artery, amputation of left third toe 11/29/2016  F/u with [name] in 2 weeks.   Thanks Selena Batten

## 2016-11-30 NOTE — Progress Notes (Signed)
Inpatient Diabetes Program Recommendations  AACE/ADA: New Consensus Statement on Inpatient Glycemic Control (2015)  Target Ranges:  Prepandial:   less than 140 mg/dL      Peak postprandial:   less than 180 mg/dL (1-2 hours)      Critically ill patients:  140 - 180 mg/dL   Results for LEANNE, BUCKERT (MRN [id number]) as of 11/30/2016 11:02  Ref. Range 11/29/2016 13:28 11/29/2016 14:19 11/29/2016 17:38 11/29/2016 21:27 11/30/2016 06:14  Glucose-Capillary Latest Ref Range: 65 - 99 mg/dL 762 (H) 263 (H) 335 (H) 176 (H) 423 (H)   Review of Glycemic Control  Diabetes history: DM 2 Outpatient Diabetes medications: Novolin N (NPH) 70 units QHS, Novolin R 20 units tid Current orders for Inpatient glycemic control: Novolog Moderate 0-15 units tid + Novolog HS scale 0-5 units  Inpatient Diabetes Program Recommendations:    Glucose 400's this am. Patient taking NPH at home. Consider ordering a portion of NPH for patient today. NPH 40 units. Patient also takes meal coverage at home. Consider Novolog 6 units tid with meals.  Thanks,  Christena Deem RN, MSN, [facility] Inpatient Diabetes Coordinator Team Pager [phone] (8a-5p)

## 2016-11-30 NOTE — Progress Notes (Signed)
  Progress Note  SUBJECTIVE:    POD #1  Having minimal pain with toe amputation site.   OBJECTIVE:   Vitals:   11/29/16 2140 11/30/16 0615  BP: 129/61 117/83  Pulse: 72 70  Resp: 17 11  Temp:  98.4 F (36.9 C)  SpO2: 97% 99%    Intake/Output Summary (Last 24 hours) at 11/30/16 0743 Last data filed at 11/30/16 0615  Gross per 24 hour  Intake              390 ml  Output              500 ml  Net             -110 ml   Left 3rd toe amputation site with nylon sutures intact. No drainage seen. Monophasic left peroneal and AT signals.  Right groin without hematoma.  ASSESSMENT/PLAN:   76 y.o. male is s/p: aortogram, anglioplasty left tibioperoneal trunk and peroneal artery, failed angioplasty left anterior tibial artery, amputation of left third toe 1 Day Post-Op   Amputation site clean. Partial weightbearing heel only on the left. Postop shoe ordered. Ok to d/c after ambulation and peritoneal dialysis complete.  Raymond Gurney 11/30/2016 7:43 AM -- LABS:   CBC    Component Value Date/Time   WBC 8.0 11/30/2016 0214   HGB 10.1 (L) 11/30/2016 0214   HCT 30.7 (L) 11/30/2016 0214   PLT 255 11/30/2016 0214    BMET    Component Value Date/Time   NA 136 11/30/2016 0214   K 3.5 11/30/2016 0214   CL 99 (L) 11/30/2016 0214   CO2 25 11/30/2016 0214   GLUCOSE 315 (H) 11/30/2016 0214   BUN 32 (H) 11/30/2016 0214   CREATININE 5.56 (H) 11/30/2016 0214   CALCIUM 8.2 (L) 11/30/2016 0214   GFRNONAA 9 (L) 11/30/2016 0214   GFRAA 10 (L) 11/30/2016 0214    COAG Lab Results  Component Value Date   INR 1.22 09/21/2015   No results found for: PTT  ANTIBIOTICS:   Anti-infectives    Start     Dose/Rate Route Frequency Ordered Stop   11/29/16 2200  doxycycline (VIBRA-TABS) tablet 100 mg     100 mg Oral 2 times daily 11/29/16 1240     11/29/16 1315  ceFAZolin (ANCEF) IVPB 2g/100 mL premix     2 g 200 mL/hr over 30 Minutes Intravenous  Once 11/29/16 1310 11/29/16 1352     11/29/16 1312  ceFAZolin (ANCEF) 2-4 GM/100ML-% IVPB    Comments:  Teresita Madura   : cabinet override      11/29/16 1312 11/29/16 1352   11/29/16 1236  amoxicillin (AMOXIL) capsule 2,000 mg  Status:  Discontinued    Comments:  1 hour prior to dental procedures     2,000 mg Oral See admin instructions 11/29/16 1240 11/29/16 1723       Maris Berger, PA-C [facility] Office: [phone] Pager: [phone] 11/30/2016 7:43 AM

## 2016-11-30 NOTE — Progress Notes (Signed)
Orthopedic Tech Progress Note Patient Details:  [name] October 08, 1940 [id number]  Ortho Devices Type of Ortho Device: Darco shoe Ortho Device/Splint Location: lle Ortho Device/Splint Interventions: Application   [name] 11/30/2016, 9:13 AM

## 2016-11-30 NOTE — Telephone Encounter (Signed)
Sched appt 12/14/16 at 3:00. Spoke to pt.

## 2016-11-30 NOTE — Progress Notes (Signed)
Pt discharged home with wife. AVS and script given to and reviewed in full with patient. Wife states she will do dressing changes for patient. All questions answered. VSS.

## 2016-12-07 ENCOUNTER — Encounter: Payer: Self-pay | Admitting: Surgery

## 2016-12-13 ENCOUNTER — Encounter: Payer: Self-pay | Admitting: Surgery

## 2016-12-14 ENCOUNTER — Encounter: Payer: Self-pay | Admitting: Surgery

## 2016-12-14 ENCOUNTER — Ambulatory Visit (INDEPENDENT_AMBULATORY_CARE_PROVIDER_SITE_OTHER): Payer: Self-pay | Admitting: Surgery

## 2016-12-14 VITALS — BP 123/56 | HR 67 | Temp 97.1°F | Resp 20 | Ht 63.0 in | Wt 203.0 lb

## 2016-12-14 DIAGNOSIS — I7025 Atherosclerosis of native arteries of other extremities with ulceration: Secondary | ICD-10-CM

## 2016-12-14 NOTE — Progress Notes (Addendum)
Patient name: Joshua KitchenJames D Meiner MRN: [id number] DOB: April 19, 1940 Sex: male  REASON FOR VISIT:     follow up  HISTORY OF PRESENT ILLNESS:   Joshua KitchenJames D Hopman is a 76 y.o. 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  On 11/29/2016 and taken for angiography.  He had severe tibial disease.  I was able to open up a peroneal artery.  I was unable to re-canalize the anterior tibial artery.  Later that day he went for 2 amputation.  He is back today for follow-up.    CURRENT MEDICATIONS:    Current Outpatient Prescriptions  Medication Sig Dispense Refill  . amoxicillin (AMOXIL) 500 MG capsule Take 2,000 mg by mouth See admin instructions. 1 hour prior to dental procedures    . aspirin EC 81 MG tablet Take 81 mg by mouth daily.    [name]. atorvastatin (LIPITOR) 80 MG tablet Take 80 mg by mouth daily at 6 PM.    . calcium acetate (PHOSLO) 667 MG capsule Take 667 mg by mouth 3 (three) times daily with meals.    . carvedilol (COREG) 12.5 MG tablet Take 12.5 mg by mouth 2 (two) times daily with a meal.    . clopidogrel (PLAVIX) 75 MG tablet Take 75 mg by mouth daily.    [name]. docusate sodium (COLACE) 100 MG capsule Take 100 mg by mouth daily.    . furosemide (LASIX) 40 MG tablet Take 40 mg by mouth 2 (two) times daily.    . insulin NPH Human (HUMULIN N,NOVOLIN N) 100 UNIT/ML injection Inject 70 Units into the skin at bedtime.    . insulin regular (NOVOLIN R,HUMULIN R) 100 units/mL injection Inject 20 Units into the skin 3 (three) times daily before meals.    . isosorbide mononitrate (IMDUR) 30 MG 24 hr tablet Take 30 mg by mouth daily.    [name]. losartan (COZAAR) 25 MG tablet Take 25 mg by mouth daily.    . nitroGLYCERIN (NITROSTAT) 0.4 MG SL tablet Place 0.4 mg under the tongue every 5 (five) minutes as needed for chest pain.    [name]. ondansetron (ZOFRAN) 4 MG tablet Take 4 mg by mouth every 6 (six) hours as needed for nausea or  vomiting.    [name]. oxyCODONE-acetaminophen (PERCOCET/ROXICET) 5-325 MG tablet Take 1 tablet by mouth every 6 (six) hours as needed for severe pain. 20 tablet 0  . Vitamin D, Ergocalciferol, (DRISDOL) 50000 units CAPS capsule Take 50,000 Units by mouth every 30 (thirty) days.     [name]. doxycycline (VIBRAMYCIN) 100 MG capsule Take 1 capsule (100 mg total) by mouth 2 (two) times daily. (Patient not taking: Reported on 12/14/2016) 28 capsule 0  . potassium chloride (K-DUR,KLOR-CON) 10 MEQ tablet Take 10 mEq by mouth daily.     No current facility-administered medications for this visit.     REVIEW OF SYSTEMS:   [X]  denotes positive finding, [ ]  denotes negative finding Cardiac  Comments:  Chest pain or chest pressure:    Shortness of breath upon exertion:    Short of breath when lying flat:    Irregular heart rhythm:    Constitutional    Fever or chills:      PHYSICAL EXAM:   Vitals:   12/14/16 1537  BP: (!) 123/56  Pulse: 67  Resp: 20  Temp: (!) 97.1 F (36.2 C)  TempSrc: Oral  SpO2: 98%  Weight: 203 lb (92.1 kg)  Height: 5\' 3"  (  1.6 m)    GENERAL: The patient is a well-nourished male, in no acute distress. The vital signs are documented above. CARDIOVASCULAR: There is a regular rate and rhythm. PULMONARY: Non-labored respirations  Sutures were removed from the toe amputation site today and the wound was to be treated.  I was able to get active bone.  He does have some erythema on the dorsum of the foot as well as some pale areas of early ischemia on the plantar surface of the foot.  He does have a buldge in the right groin at the access site.  It is unclear if this is a pseudoaneurysm or hematoma.  It can be evaluated at angio  STUDIES:   None   MEDICAL ISSUES:   I am very concerned about the patient's ability to heal the toe amputation site I think he is going to be more proximal amputation.  I would like to repeat his angiography with possible pedal access and plans for  recanalization of his anterior tibial artery.  This would potentially help keep the amputation limited to the foot as opposed to requiring a more proximal below knee amputation.  The patient understands this.  I'm going to speak with Dr. Randie Heinz to see if he is available for pedal access  Durene Cal, MD [facility] [phone] Pager [phone]

## 2016-12-15 ENCOUNTER — Encounter (HOSPITAL_COMMUNITY)
Admission: RE | Disposition: A | Discharge status: Rehabilitation Facility | Payer: Self-pay | Source: Ambulatory Visit | Attending: Vascular Surgery

## 2016-12-15 ENCOUNTER — Encounter (HOSPITAL_COMMUNITY): Payer: Self-pay

## 2016-12-15 ENCOUNTER — Ambulatory Visit (HOSPITAL_COMMUNITY): Payer: Medicare Other | Admitting: Anesthesiology

## 2016-12-15 ENCOUNTER — Other Ambulatory Visit: Payer: Self-pay

## 2016-12-15 ENCOUNTER — Inpatient Hospital Stay (HOSPITAL_COMMUNITY)
Admission: RE | Admit: 2016-12-15 | Discharge: 2016-12-27 | DRG: 239 | Disposition: A | Discharge status: Rehabilitation Facility | Payer: Medicare Other | Source: Ambulatory Visit | Attending: Vascular Surgery | Admitting: Vascular Surgery

## 2016-12-15 DIAGNOSIS — E7800 Pure hypercholesterolemia, unspecified: Secondary | ICD-10-CM | POA: Diagnosis present

## 2016-12-15 DIAGNOSIS — I959 Hypotension, unspecified: Secondary | ICD-10-CM | POA: Diagnosis not present

## 2016-12-15 DIAGNOSIS — D631 Anemia in chronic kidney disease: Secondary | ICD-10-CM | POA: Diagnosis present

## 2016-12-15 DIAGNOSIS — E876 Hypokalemia: Secondary | ICD-10-CM | POA: Diagnosis present

## 2016-12-15 DIAGNOSIS — I739 Peripheral vascular disease, unspecified: Secondary | ICD-10-CM | POA: Diagnosis present

## 2016-12-15 DIAGNOSIS — K5901 Slow transit constipation: Secondary | ICD-10-CM | POA: Diagnosis not present

## 2016-12-15 DIAGNOSIS — K5900 Constipation, unspecified: Secondary | ICD-10-CM | POA: Diagnosis present

## 2016-12-15 DIAGNOSIS — I743 Embolism and thrombosis of arteries of the lower extremities: Secondary | ICD-10-CM | POA: Diagnosis present

## 2016-12-15 DIAGNOSIS — I998 Other disorder of circulatory system: Secondary | ICD-10-CM | POA: Diagnosis present

## 2016-12-15 DIAGNOSIS — E8889 Other specified metabolic disorders: Secondary | ICD-10-CM | POA: Diagnosis present

## 2016-12-15 DIAGNOSIS — E1122 Type 2 diabetes mellitus with diabetic chronic kidney disease: Secondary | ICD-10-CM | POA: Diagnosis present

## 2016-12-15 DIAGNOSIS — R509 Fever, unspecified: Secondary | ICD-10-CM | POA: Diagnosis not present

## 2016-12-15 DIAGNOSIS — E11621 Type 2 diabetes mellitus with foot ulcer: Secondary | ICD-10-CM | POA: Diagnosis present

## 2016-12-15 DIAGNOSIS — I25810 Atherosclerosis of coronary artery bypass graft(s) without angina pectoris: Secondary | ICD-10-CM | POA: Diagnosis present

## 2016-12-15 DIAGNOSIS — I25709 Atherosclerosis of coronary artery bypass graft(s), unspecified, with unspecified angina pectoris: Secondary | ICD-10-CM | POA: Diagnosis not present

## 2016-12-15 DIAGNOSIS — I96 Gangrene, not elsewhere classified: Secondary | ICD-10-CM | POA: Diagnosis not present

## 2016-12-15 DIAGNOSIS — G9340 Encephalopathy, unspecified: Secondary | ICD-10-CM | POA: Diagnosis not present

## 2016-12-15 DIAGNOSIS — I10 Essential (primary) hypertension: Secondary | ICD-10-CM | POA: Diagnosis not present

## 2016-12-15 DIAGNOSIS — I70245 Atherosclerosis of native arteries of left leg with ulceration of other part of foot: Secondary | ICD-10-CM | POA: Diagnosis not present

## 2016-12-15 DIAGNOSIS — D62 Acute posthemorrhagic anemia: Secondary | ICD-10-CM | POA: Diagnosis present

## 2016-12-15 DIAGNOSIS — I2510 Atherosclerotic heart disease of native coronary artery without angina pectoris: Secondary | ICD-10-CM | POA: Diagnosis present

## 2016-12-15 DIAGNOSIS — Z23 Encounter for immunization: Secondary | ICD-10-CM | POA: Diagnosis not present

## 2016-12-15 DIAGNOSIS — Z7982 Long term (current) use of aspirin: Secondary | ICD-10-CM

## 2016-12-15 DIAGNOSIS — M79671 Pain in right foot: Secondary | ICD-10-CM | POA: Diagnosis not present

## 2016-12-15 DIAGNOSIS — N186 End stage renal disease: Secondary | ICD-10-CM | POA: Diagnosis present

## 2016-12-15 DIAGNOSIS — T8753 Necrosis of amputation stump, right lower extremity: Secondary | ICD-10-CM | POA: Diagnosis present

## 2016-12-15 DIAGNOSIS — E871 Hypo-osmolality and hyponatremia: Secondary | ICD-10-CM | POA: Diagnosis present

## 2016-12-15 DIAGNOSIS — Z91048 Other nonmedicinal substance allergy status: Secondary | ICD-10-CM

## 2016-12-15 DIAGNOSIS — Y838 Other surgical procedures as the cause of abnormal reaction of the patient, or of later complication, without mention of misadventure at the time of the procedure: Secondary | ICD-10-CM | POA: Diagnosis present

## 2016-12-15 DIAGNOSIS — E1165 Type 2 diabetes mellitus with hyperglycemia: Secondary | ICD-10-CM | POA: Diagnosis present

## 2016-12-15 DIAGNOSIS — E1151 Type 2 diabetes mellitus with diabetic peripheral angiopathy without gangrene: Secondary | ICD-10-CM | POA: Diagnosis present

## 2016-12-15 DIAGNOSIS — L89813 Pressure ulcer of head, stage 3: Secondary | ICD-10-CM | POA: Diagnosis not present

## 2016-12-15 DIAGNOSIS — I724 Aneurysm of artery of lower extremity: Secondary | ICD-10-CM | POA: Diagnosis present

## 2016-12-15 DIAGNOSIS — J449 Chronic obstructive pulmonary disease, unspecified: Secondary | ICD-10-CM | POA: Diagnosis present

## 2016-12-15 DIAGNOSIS — R110 Nausea: Secondary | ICD-10-CM

## 2016-12-15 DIAGNOSIS — G4733 Obstructive sleep apnea (adult) (pediatric): Secondary | ICD-10-CM | POA: Diagnosis present

## 2016-12-15 DIAGNOSIS — G9341 Metabolic encephalopathy: Secondary | ICD-10-CM | POA: Diagnosis not present

## 2016-12-15 DIAGNOSIS — J029 Acute pharyngitis, unspecified: Secondary | ICD-10-CM | POA: Diagnosis present

## 2016-12-15 DIAGNOSIS — I953 Hypotension of hemodialysis: Secondary | ICD-10-CM | POA: Diagnosis not present

## 2016-12-15 DIAGNOSIS — I132 Hypertensive heart and chronic kidney disease with heart failure and with stage 5 chronic kidney disease, or end stage renal disease: Secondary | ICD-10-CM | POA: Diagnosis present

## 2016-12-15 DIAGNOSIS — Z8249 Family history of ischemic heart disease and other diseases of the circulatory system: Secondary | ICD-10-CM

## 2016-12-15 DIAGNOSIS — E639 Nutritional deficiency, unspecified: Secondary | ICD-10-CM | POA: Diagnosis not present

## 2016-12-15 DIAGNOSIS — S88111S Complete traumatic amputation at level between knee and ankle, right lower leg, sequela: Secondary | ICD-10-CM | POA: Diagnosis not present

## 2016-12-15 DIAGNOSIS — G546 Phantom limb syndrome with pain: Secondary | ICD-10-CM | POA: Diagnosis not present

## 2016-12-15 DIAGNOSIS — K824 Cholesterolosis of gallbladder: Secondary | ICD-10-CM | POA: Diagnosis present

## 2016-12-15 DIAGNOSIS — R443 Hallucinations, unspecified: Secondary | ICD-10-CM | POA: Diagnosis not present

## 2016-12-15 DIAGNOSIS — E162 Hypoglycemia, unspecified: Secondary | ICD-10-CM | POA: Diagnosis not present

## 2016-12-15 DIAGNOSIS — L97519 Non-pressure chronic ulcer of other part of right foot with unspecified severity: Secondary | ICD-10-CM | POA: Diagnosis not present

## 2016-12-15 DIAGNOSIS — G253 Myoclonus: Secondary | ICD-10-CM | POA: Diagnosis not present

## 2016-12-15 DIAGNOSIS — I952 Hypotension due to drugs: Secondary | ICD-10-CM | POA: Diagnosis not present

## 2016-12-15 DIAGNOSIS — Z992 Dependence on renal dialysis: Secondary | ICD-10-CM | POA: Diagnosis not present

## 2016-12-15 DIAGNOSIS — Y835 Amputation of limb(s) as the cause of abnormal reaction of the patient, or of later complication, without mention of misadventure at the time of the procedure: Secondary | ICD-10-CM | POA: Diagnosis present

## 2016-12-15 DIAGNOSIS — R0989 Other specified symptoms and signs involving the circulatory and respiratory systems: Secondary | ICD-10-CM | POA: Diagnosis not present

## 2016-12-15 DIAGNOSIS — R5381 Other malaise: Secondary | ICD-10-CM | POA: Diagnosis present

## 2016-12-15 DIAGNOSIS — I509 Heart failure, unspecified: Secondary | ICD-10-CM | POA: Diagnosis present

## 2016-12-15 DIAGNOSIS — T8189XA Other complications of procedures, not elsewhere classified, initial encounter: Secondary | ICD-10-CM | POA: Diagnosis present

## 2016-12-15 DIAGNOSIS — E11649 Type 2 diabetes mellitus with hypoglycemia without coma: Secondary | ICD-10-CM | POA: Diagnosis not present

## 2016-12-15 DIAGNOSIS — Z951 Presence of aortocoronary bypass graft: Secondary | ICD-10-CM

## 2016-12-15 DIAGNOSIS — Z888 Allergy status to other drugs, medicaments and biological substances status: Secondary | ICD-10-CM

## 2016-12-15 DIAGNOSIS — T8131XA Disruption of external operation (surgical) wound, not elsewhere classified, initial encounter: Secondary | ICD-10-CM | POA: Diagnosis not present

## 2016-12-15 DIAGNOSIS — L98491 Non-pressure chronic ulcer of skin of other sites limited to breakdown of skin: Secondary | ICD-10-CM | POA: Diagnosis not present

## 2016-12-15 DIAGNOSIS — N2581 Secondary hyperparathyroidism of renal origin: Secondary | ICD-10-CM | POA: Diagnosis present

## 2016-12-15 DIAGNOSIS — Z89432 Acquired absence of left foot: Secondary | ICD-10-CM | POA: Diagnosis not present

## 2016-12-15 DIAGNOSIS — R410 Disorientation, unspecified: Secondary | ICD-10-CM | POA: Diagnosis not present

## 2016-12-15 DIAGNOSIS — Z7902 Long term (current) use of antithrombotics/antiplatelets: Secondary | ICD-10-CM

## 2016-12-15 DIAGNOSIS — E109 Type 1 diabetes mellitus without complications: Secondary | ICD-10-CM | POA: Diagnosis not present

## 2016-12-15 DIAGNOSIS — K829 Disease of gallbladder, unspecified: Secondary | ICD-10-CM

## 2016-12-15 DIAGNOSIS — M3500 Sicca syndrome, unspecified: Secondary | ICD-10-CM | POA: Diagnosis present

## 2016-12-15 DIAGNOSIS — L97529 Non-pressure chronic ulcer of other part of left foot with unspecified severity: Secondary | ICD-10-CM | POA: Diagnosis present

## 2016-12-15 DIAGNOSIS — D72829 Elevated white blood cell count, unspecified: Secondary | ICD-10-CM | POA: Diagnosis not present

## 2016-12-15 DIAGNOSIS — R0682 Tachypnea, not elsewhere classified: Secondary | ICD-10-CM

## 2016-12-15 DIAGNOSIS — E1142 Type 2 diabetes mellitus with diabetic polyneuropathy: Secondary | ICD-10-CM | POA: Diagnosis not present

## 2016-12-15 DIAGNOSIS — T8131XD Disruption of external operation (surgical) wound, not elsewhere classified, subsequent encounter: Secondary | ICD-10-CM | POA: Diagnosis not present

## 2016-12-15 DIAGNOSIS — E669 Obesity, unspecified: Secondary | ICD-10-CM | POA: Diagnosis not present

## 2016-12-15 DIAGNOSIS — E1152 Type 2 diabetes mellitus with diabetic peripheral angiopathy with gangrene: Secondary | ICD-10-CM | POA: Diagnosis present

## 2016-12-15 DIAGNOSIS — Z95810 Presence of automatic (implantable) cardiac defibrillator: Secondary | ICD-10-CM

## 2016-12-15 DIAGNOSIS — J392 Other diseases of pharynx: Secondary | ICD-10-CM | POA: Diagnosis present

## 2016-12-15 DIAGNOSIS — Z9862 Peripheral vascular angioplasty status: Secondary | ICD-10-CM

## 2016-12-15 DIAGNOSIS — Z794 Long term (current) use of insulin: Secondary | ICD-10-CM | POA: Diagnosis not present

## 2016-12-15 DIAGNOSIS — Z8673 Personal history of transient ischemic attack (TIA), and cerebral infarction without residual deficits: Secondary | ICD-10-CM

## 2016-12-15 DIAGNOSIS — Z89422 Acquired absence of other left toe(s): Secondary | ICD-10-CM

## 2016-12-15 DIAGNOSIS — I9589 Other hypotension: Secondary | ICD-10-CM | POA: Diagnosis not present

## 2016-12-15 DIAGNOSIS — Z89421 Acquired absence of other right toe(s): Secondary | ICD-10-CM | POA: Diagnosis not present

## 2016-12-15 DIAGNOSIS — R42 Dizziness and giddiness: Secondary | ICD-10-CM | POA: Diagnosis not present

## 2016-12-15 DIAGNOSIS — I999 Unspecified disorder of circulatory system: Secondary | ICD-10-CM | POA: Diagnosis not present

## 2016-12-15 DIAGNOSIS — Z87891 Personal history of nicotine dependence: Secondary | ICD-10-CM

## 2016-12-15 DIAGNOSIS — L89812 Pressure ulcer of head, stage 2: Secondary | ICD-10-CM | POA: Diagnosis not present

## 2016-12-15 DIAGNOSIS — Z882 Allergy status to sulfonamides status: Secondary | ICD-10-CM

## 2016-12-15 DIAGNOSIS — T8130XA Disruption of wound, unspecified, initial encounter: Secondary | ICD-10-CM | POA: Diagnosis not present

## 2016-12-15 DIAGNOSIS — R7309 Other abnormal glucose: Secondary | ICD-10-CM | POA: Diagnosis not present

## 2016-12-15 DIAGNOSIS — Z881 Allergy status to other antibiotic agents status: Secondary | ICD-10-CM

## 2016-12-15 DIAGNOSIS — E1169 Type 2 diabetes mellitus with other specified complication: Secondary | ICD-10-CM | POA: Diagnosis not present

## 2016-12-15 DIAGNOSIS — D638 Anemia in other chronic diseases classified elsewhere: Secondary | ICD-10-CM | POA: Diagnosis present

## 2016-12-15 DIAGNOSIS — Z6835 Body mass index (BMI) 35.0-35.9, adult: Secondary | ICD-10-CM

## 2016-12-15 DIAGNOSIS — Z4781 Encounter for orthopedic aftercare following surgical amputation: Secondary | ICD-10-CM | POA: Diagnosis present

## 2016-12-15 DIAGNOSIS — F4321 Adjustment disorder with depressed mood: Secondary | ICD-10-CM | POA: Diagnosis not present

## 2016-12-15 HISTORY — PX: FALSE ANEURYSM REPAIR: SHX5152

## 2016-12-15 HISTORY — DX: Presence of automatic (implantable) cardiac defibrillator: Z95.810

## 2016-12-15 HISTORY — PX: LOWER EXTREMITY ANGIOGRAM: SHX5508

## 2016-12-15 HISTORY — PX: ANGIOPLASTY: SHX39

## 2016-12-15 HISTORY — PX: IRRIGATION AND DEBRIDEMENT FOOT: SHX6602

## 2016-12-15 LAB — POCT I-STAT, CHEM 8
BUN: 38 mg/dL — AB (ref 6–20)
CHLORIDE: 92 mmol/L — AB (ref 101–111)
CREATININE: 5.8 mg/dL — AB (ref 0.61–1.24)
Calcium, Ion: 1.07 mmol/L — ABNORMAL LOW (ref 1.15–1.40)
Glucose, Bld: 276 mg/dL — ABNORMAL HIGH (ref 65–99)
HEMATOCRIT: 31 % — AB (ref 39.0–52.0)
Hemoglobin: 10.5 g/dL — ABNORMAL LOW (ref 13.0–17.0)
Potassium: 4 mmol/L (ref 3.5–5.1)
SODIUM: 135 mmol/L (ref 135–145)
TCO2: 31 mmol/L (ref 22–32)

## 2016-12-15 LAB — CREATININE, SERUM
Creatinine, Ser: 5.72 mg/dL — ABNORMAL HIGH (ref 0.61–1.24)
GFR calc Af Amer: 10 mL/min — ABNORMAL LOW (ref >60)
GFR calc non Af Amer: 9 mL/min — ABNORMAL LOW (ref >60)

## 2016-12-15 LAB — CBC
HEMATOCRIT: 29.1 % — AB (ref 39.0–52.0)
HEMOGLOBIN: 9.6 g/dL — AB (ref 13.0–17.0)
MCH: 31.9 pg (ref 26.0–34.0)
MCHC: 33 g/dL (ref 30.0–36.0)
MCV: 96.7 fL (ref 78.0–100.0)
Platelets: 282 10*3/uL (ref 150–400)
RBC: 3.01 MIL/uL — ABNORMAL LOW (ref 4.22–5.81)
RDW: 13.1 % (ref 11.5–15.5)
WBC: 10.9 10*3/uL — ABNORMAL HIGH (ref 4.0–10.5)

## 2016-12-15 LAB — GLUCOSE, CAPILLARY
Glucose-Capillary: 289 mg/dL — ABNORMAL HIGH (ref 65–99)
Glucose-Capillary: 268 mg/dL — ABNORMAL HIGH (ref 65–99)
Glucose-Capillary: 196 mg/dL — ABNORMAL HIGH (ref 65–99)
Glucose-Capillary: 136 mg/dL — ABNORMAL HIGH (ref 65–99)

## 2016-12-15 SURGERY — ANGIOGRAM, LOWER EXTREMITY
Anesthesia: Monitor Anesthesia Care | Site: Leg Lower | Laterality: Right

## 2016-12-15 MED ORDER — VERAPAMIL HCL 2.5 MG/ML IV SOLN
5.0000 mg | INTRAVENOUS | Status: DC
  Filled 2016-12-15: qty 2

## 2016-12-15 MED ORDER — NITROGLYCERIN FOR UTERINE RELAXATION OPTIME 200MCG/ML
INTRAVENOUS | Status: AC | PRN
  Administered 2016-12-15: 200 ug via INTRAVENOUS

## 2016-12-15 MED ORDER — MEPERIDINE HCL 25 MG/ML IJ SOLN: 6.2500 mg | INTRAMUSCULAR | Status: DC | PRN

## 2016-12-15 MED ORDER — MIDAZOLAM HCL 2 MG/2ML IJ SOLN: 0.5000 mg | Freq: Once | INTRAMUSCULAR | Status: DC | PRN

## 2016-12-15 MED ORDER — MORPHINE SULFATE (PF) 2 MG/ML IV SOLN
2.0000 mg | INTRAVENOUS | Status: DC | PRN
  Administered 2016-12-15 – 2016-12-19 (×4): 2 mg via INTRAVENOUS
  Filled 2016-12-15 (×4): qty 1

## 2016-12-15 MED ORDER — ORAL CARE MOUTH RINSE
15.0000 mL | Freq: Two times a day (BID) | OROMUCOSAL | Status: DC
  Administered 2016-12-16 – 2016-12-27 (×13): 15 mL via OROMUCOSAL

## 2016-12-15 MED ORDER — CEFUROXIME SODIUM 1.5 G IV SOLR
1.5000 g | INTRAVENOUS | Status: AC
  Administered 2016-12-15: 1.5 g via INTRAVENOUS

## 2016-12-15 MED ORDER — NITROGLYCERIN IN D5W 200-5 MCG/ML-% IV SOLN
0.0000 ug/min | INTRAVENOUS | Status: DC
  Filled 2016-12-15: qty 250

## 2016-12-15 MED ORDER — HEPARIN SODIUM (PORCINE) 5000 UNIT/ML IJ SOLN
5000.0000 [IU] | Freq: Three times a day (TID) | INTRAMUSCULAR | Status: DC
  Administered 2016-12-16 – 2016-12-27 (×35): 5000 [IU] via SUBCUTANEOUS
  Filled 2016-12-15 (×35): qty 1

## 2016-12-15 MED ORDER — CEFUROXIME SODIUM 1.5 G IV SOLR
1.5000 g | Freq: Once | INTRAVENOUS | Status: AC
  Administered 2016-12-16: 1.5 g via INTRAVENOUS
  Filled 2016-12-15: qty 1.5

## 2016-12-15 MED ORDER — FENTANYL CITRATE (PF) 100 MCG/2ML IJ SOLN
INTRAMUSCULAR | Status: AC
  Administered 2016-12-15: 25 ug via INTRAVENOUS
  Filled 2016-12-15: qty 2

## 2016-12-15 MED ORDER — BISACODYL 10 MG RE SUPP: 10.0000 mg | Freq: Every day | RECTAL | Status: DC | PRN

## 2016-12-15 MED ORDER — PROMETHAZINE HCL 25 MG/ML IJ SOLN: 6.2500 mg | INTRAMUSCULAR | Status: DC | PRN

## 2016-12-15 MED ORDER — FUROSEMIDE 40 MG PO TABS
40.0000 mg | ORAL_TABLET | Freq: Two times a day (BID) | ORAL | Status: DC
  Administered 2016-12-16 – 2016-12-27 (×22): 40 mg via ORAL
  Filled 2016-12-15 (×23): qty 1

## 2016-12-15 MED ORDER — ONDANSETRON HCL 4 MG PO TABS
4.0000 mg | ORAL_TABLET | Freq: Four times a day (QID) | ORAL | Status: DC | PRN
  Administered 2016-12-20: 4 mg via ORAL
  Filled 2016-12-15: qty 1

## 2016-12-15 MED ORDER — NEOSTIGMINE METHYLSULFATE 10 MG/10ML IV SOLN
INTRAVENOUS | Status: DC | PRN
  Administered 2016-12-15: 5 mg via INTRAVENOUS

## 2016-12-15 MED ORDER — FENTANYL CITRATE (PF) 100 MCG/2ML IJ SOLN
INTRAMUSCULAR | Status: DC | PRN
  Administered 2016-12-15: 50 ug via INTRAVENOUS
  Administered 2016-12-15: 25 ug via INTRAVENOUS

## 2016-12-15 MED ORDER — CLOPIDOGREL BISULFATE 75 MG PO TABS
75.0000 mg | ORAL_TABLET | Freq: Every day | ORAL | Status: DC
Start: 2016-12-15 — End: 2016-12-27
  Administered 2016-12-16 – 2016-12-27 (×11): 75 mg via ORAL
  Filled 2016-12-15 (×12): qty 1

## 2016-12-15 MED ORDER — CISATRACURIUM BESYLATE (PF) 10 MG/5ML IV SOLN
INTRAVENOUS | Status: DC | PRN
  Administered 2016-12-15 (×2): 2 mg via INTRAVENOUS
  Administered 2016-12-15: 16 mg via INTRAVENOUS
  Administered 2016-12-15: 2 mg via INTRAVENOUS

## 2016-12-15 MED ORDER — GUAIFENESIN-DM 100-10 MG/5ML PO SYRP: 15.0000 mL | ORAL_SOLUTION | ORAL | Status: DC | PRN

## 2016-12-15 MED ORDER — INSULIN ASPART 100 UNIT/ML ~~LOC~~ SOLN
20.0000 [IU] | Freq: Three times a day (TID) | SUBCUTANEOUS | Status: DC
  Administered 2016-12-16 – 2016-12-17 (×4): 20 [IU] via SUBCUTANEOUS

## 2016-12-15 MED ORDER — INSULIN NPH (HUMAN) (ISOPHANE) 100 UNIT/ML ~~LOC~~ SUSP
35.0000 [IU] | Freq: Every day | SUBCUTANEOUS | Status: DC
  Administered 2016-12-16 – 2016-12-18 (×3): 35 [IU] via SUBCUTANEOUS
  Filled 2016-12-15: qty 10

## 2016-12-15 MED ORDER — VITAMIN D (ERGOCALCIFEROL) 1.25 MG (50000 UNIT) PO CAPS: 50000.0000 [IU] | ORAL_CAPSULE | ORAL | Status: DC

## 2016-12-15 MED ORDER — MIDAZOLAM HCL 2 MG/2ML IJ SOLN
INTRAMUSCULAR | Status: AC
  Filled 2016-12-15: qty 2

## 2016-12-15 MED ORDER — METOPROLOL TARTRATE 5 MG/5ML IV SOLN: 2.0000 mg | INTRAVENOUS | Status: DC | PRN

## 2016-12-15 MED ORDER — INSULIN ASPART 100 UNIT/ML ~~LOC~~ SOLN
0.0000 [IU] | Freq: Three times a day (TID) | SUBCUTANEOUS | Status: DC
  Administered 2016-12-16 (×2): 5 [IU] via SUBCUTANEOUS
  Administered 2016-12-16: 8 [IU] via SUBCUTANEOUS
  Administered 2016-12-17 (×2): 15 [IU] via SUBCUTANEOUS
  Administered 2016-12-17: 5 [IU] via SUBCUTANEOUS
  Administered 2016-12-18 (×2): 8 [IU] via SUBCUTANEOUS
  Administered 2016-12-18: 11 [IU] via SUBCUTANEOUS
  Administered 2016-12-19: 15 [IU] via SUBCUTANEOUS

## 2016-12-15 MED ORDER — CISATRACURIUM BESYLATE 20 MG/10ML IV SOLN
INTRAVENOUS | Status: AC
  Filled 2016-12-15: qty 10

## 2016-12-15 MED ORDER — LABETALOL HCL 5 MG/ML IV SOLN: 10.0000 mg | INTRAVENOUS | Status: DC | PRN

## 2016-12-15 MED ORDER — CARVEDILOL 12.5 MG PO TABS
12.5000 mg | ORAL_TABLET | Freq: Two times a day (BID) | ORAL | Status: DC
  Administered 2016-12-16 – 2016-12-27 (×23): 12.5 mg via ORAL
  Filled 2016-12-15 (×23): qty 1

## 2016-12-15 MED ORDER — SODIUM CHLORIDE 0.9 % IJ SOLN: INTRAMUSCULAR | Status: DC | PRN

## 2016-12-15 MED ORDER — VERAPAMIL HCL 2.5 MG/ML IV SOLN
INTRAVENOUS | Status: DC | PRN
  Administered 2016-12-15: 5 mg via INTRAVENOUS

## 2016-12-15 MED ORDER — FENTANYL CITRATE (PF) 250 MCG/5ML IJ SOLN
INTRAMUSCULAR | Status: AC
  Filled 2016-12-15: qty 5

## 2016-12-15 MED ORDER — LIDOCAINE HCL (CARDIAC) 20 MG/ML IV SOLN
INTRAVENOUS | Status: DC | PRN
  Administered 2016-12-15: 15 mg via INTRAVENOUS

## 2016-12-15 MED ORDER — LIDOCAINE 2% (20 MG/ML) 5 ML SYRINGE
INTRAMUSCULAR | Status: AC
  Filled 2016-12-15: qty 5

## 2016-12-15 MED ORDER — 0.9 % SODIUM CHLORIDE (POUR BTL) OPTIME
TOPICAL | Status: DC | PRN
  Administered 2016-12-15: 2000 mL

## 2016-12-15 MED ORDER — HEPARIN SODIUM (PORCINE) 1000 UNIT/ML IJ SOLN
INTRAMUSCULAR | Status: AC
  Filled 2016-12-15: qty 1

## 2016-12-15 MED ORDER — FENTANYL CITRATE (PF) 100 MCG/2ML IJ SOLN
INTRAMUSCULAR | Status: AC
  Filled 2016-12-15: qty 2

## 2016-12-15 MED ORDER — SODIUM CHLORIDE 0.9 % IV SOLN: INTRAVENOUS | Status: DC

## 2016-12-15 MED ORDER — SODIUM CHLORIDE 0.9 % IV SOLN
INTRAVENOUS | Status: DC | PRN
  Administered 2016-12-15 (×2): via INTRAVENOUS

## 2016-12-15 MED ORDER — HEPARIN SODIUM (PORCINE) 5000 UNIT/ML IJ SOLN
INTRAMUSCULAR | Status: DC | PRN
  Administered 2016-12-15: 14:00:00

## 2016-12-15 MED ORDER — ASPIRIN EC 81 MG PO TBEC
81.0000 mg | DELAYED_RELEASE_TABLET | Freq: Every day | ORAL | Status: DC
  Administered 2016-12-16 – 2016-12-27 (×12): 81 mg via ORAL
  Filled 2016-12-15 (×12): qty 1

## 2016-12-15 MED ORDER — ACETAMINOPHEN 650 MG RE SUPP
325.0000 mg | RECTAL | Status: DC | PRN
  Administered 2016-12-17: 650 mg via RECTAL
  Filled 2016-12-15: qty 1

## 2016-12-15 MED ORDER — LOSARTAN POTASSIUM 25 MG PO TABS
25.0000 mg | ORAL_TABLET | Freq: Every day | ORAL | Status: DC
  Administered 2016-12-15 – 2016-12-26 (×12): 25 mg via ORAL
  Filled 2016-12-15 (×12): qty 1

## 2016-12-15 MED ORDER — PHENYLEPHRINE 40 MCG/ML (10ML) SYRINGE FOR IV PUSH (FOR BLOOD PRESSURE SUPPORT)
PREFILLED_SYRINGE | INTRAVENOUS | Status: AC
  Filled 2016-12-15: qty 10

## 2016-12-15 MED ORDER — POLYETHYLENE GLYCOL 3350 17 G PO PACK
17.0000 g | PACK | Freq: Every day | ORAL | Status: DC | PRN
  Administered 2016-12-18 – 2016-12-21 (×2): 17 g via ORAL
  Filled 2016-12-15 (×2): qty 1

## 2016-12-15 MED ORDER — SODIUM CHLORIDE 0.9 % IR SOLN
Status: DC | PRN
  Administered 2016-12-15: 1000 mL

## 2016-12-15 MED ORDER — PROPOFOL 10 MG/ML IV BOLUS
INTRAVENOUS | Status: DC | PRN
  Administered 2016-12-15: 30 mg via INTRAVENOUS
  Administered 2016-12-15: 140 mg via INTRAVENOUS

## 2016-12-15 MED ORDER — CHLORHEXIDINE GLUCONATE CLOTH 2 % EX PADS: 6.0000 | MEDICATED_PAD | Freq: Once | CUTANEOUS | Status: DC

## 2016-12-15 MED ORDER — LIDOCAINE HCL (PF) 1 % IJ SOLN
INTRAMUSCULAR | Status: DC | PRN
  Administered 2016-12-15: 10 mL

## 2016-12-15 MED ORDER — IODIXANOL 320 MG/ML IV SOLN
INTRAVENOUS | Status: DC | PRN
  Administered 2016-12-15: 125 mL via INTRAVENOUS

## 2016-12-15 MED ORDER — ACETAMINOPHEN 325 MG PO TABS
325.0000 mg | ORAL_TABLET | ORAL | Status: DC | PRN
  Administered 2016-12-17: 650 mg via ORAL
  Filled 2016-12-15 (×2): qty 2

## 2016-12-15 MED ORDER — NITROGLYCERIN 0.4 MG SL SUBL: 0.4000 mg | SUBLINGUAL_TABLET | SUBLINGUAL | Status: DC | PRN

## 2016-12-15 MED ORDER — GLYCOPYRROLATE 0.2 MG/ML IJ SOLN
INTRAMUSCULAR | Status: DC | PRN
  Administered 2016-12-15: .8 mg via INTRAVENOUS

## 2016-12-15 MED ORDER — HEPARIN SODIUM (PORCINE) 1000 UNIT/ML IJ SOLN
INTRAMUSCULAR | Status: DC | PRN
  Administered 2016-12-15: 10000 [IU] via INTRAVENOUS

## 2016-12-15 MED ORDER — PHENYLEPHRINE HCL 10 MG/ML IJ SOLN
INTRAMUSCULAR | Status: DC | PRN
  Administered 2016-12-15 (×2): 80 ug via INTRAVENOUS
  Administered 2016-12-15: 50 ug via INTRAVENOUS

## 2016-12-15 MED ORDER — DOCUSATE SODIUM 100 MG PO CAPS
100.0000 mg | ORAL_CAPSULE | Freq: Two times a day (BID) | ORAL | Status: DC
  Administered 2016-12-15 – 2016-12-21 (×13): 100 mg via ORAL
  Filled 2016-12-15 (×16): qty 1

## 2016-12-15 MED ORDER — SODIUM CHLORIDE 0.9 % IV SOLN
500.0000 mL | Freq: Once | INTRAVENOUS | Status: AC | PRN
  Administered 2016-12-18: 500 mL via INTRAVENOUS

## 2016-12-15 MED ORDER — ONDANSETRON HCL 4 MG/2ML IJ SOLN
4.0000 mg | Freq: Four times a day (QID) | INTRAMUSCULAR | Status: DC | PRN
  Administered 2016-12-17 – 2016-12-18 (×3): 4 mg via INTRAVENOUS
  Filled 2016-12-15 (×3): qty 2

## 2016-12-15 MED ORDER — LIDOCAINE HCL (PF) 1 % IJ SOLN
INTRAMUSCULAR | Status: AC
  Filled 2016-12-15: qty 30

## 2016-12-15 MED ORDER — ISOSORBIDE MONONITRATE ER 30 MG PO TB24
30.0000 mg | ORAL_TABLET | Freq: Every day | ORAL | Status: DC
  Administered 2016-12-16 – 2016-12-27 (×12): 30 mg via ORAL
  Filled 2016-12-15 (×12): qty 1

## 2016-12-15 MED ORDER — ONDANSETRON HCL 4 MG/2ML IJ SOLN
INTRAMUSCULAR | Status: DC | PRN
  Administered 2016-12-15: 4 mg via INTRAVENOUS

## 2016-12-15 MED ORDER — HYDRALAZINE HCL 20 MG/ML IJ SOLN: 5.0000 mg | INTRAMUSCULAR | Status: DC | PRN

## 2016-12-15 MED ORDER — PANTOPRAZOLE SODIUM 40 MG PO TBEC
40.0000 mg | DELAYED_RELEASE_TABLET | Freq: Every day | ORAL | Status: DC
  Administered 2016-12-16 – 2016-12-27 (×11): 40 mg via ORAL
  Filled 2016-12-15 (×12): qty 1

## 2016-12-15 MED ORDER — PHENOL 1.4 % MT LIQD
1.0000 | OROMUCOSAL | Status: DC | PRN
  Administered 2016-12-22: 1 via OROMUCOSAL
  Filled 2016-12-15: qty 177

## 2016-12-15 MED ORDER — DEXTROSE 5 % IV SOLN
INTRAVENOUS | Status: DC | PRN
  Administered 2016-12-15: 25 ug/min via INTRAVENOUS

## 2016-12-15 MED ORDER — DEXTROSE 5 % IV SOLN
INTRAVENOUS | Status: AC
  Filled 2016-12-15: qty 1.5

## 2016-12-15 MED ORDER — OXYCODONE-ACETAMINOPHEN 5-325 MG PO TABS
1.0000 | ORAL_TABLET | Freq: Four times a day (QID) | ORAL | Status: DC | PRN
  Administered 2016-12-15: 1 via ORAL
  Administered 2016-12-16 – 2016-12-21 (×6): 2 via ORAL
  Administered 2016-12-22 (×2): 1 via ORAL
  Administered 2016-12-24 – 2016-12-27 (×6): 2 via ORAL
  Filled 2016-12-15 (×2): qty 2
  Filled 2016-12-15: qty 1
  Filled 2016-12-15 (×8): qty 2
  Filled 2016-12-15 (×2): qty 1
  Filled 2016-12-15 (×3): qty 2

## 2016-12-15 MED ORDER — CARVEDILOL 12.5 MG PO TABS
ORAL_TABLET | ORAL | Status: AC
  Administered 2016-12-15: 12.5 mg
  Filled 2016-12-15: qty 1

## 2016-12-15 MED ORDER — FENTANYL CITRATE (PF) 100 MCG/2ML IJ SOLN
25.0000 ug | INTRAMUSCULAR | Status: DC | PRN
  Administered 2016-12-15 (×2): 25 ug via INTRAVENOUS

## 2016-12-15 MED ORDER — CALCIUM ACETATE (PHOS BINDER) 667 MG PO CAPS
667.0000 mg | ORAL_CAPSULE | Freq: Three times a day (TID) | ORAL | Status: DC
  Administered 2016-12-16 – 2016-12-27 (×16): 667 mg via ORAL
  Filled 2016-12-15 (×27): qty 1

## 2016-12-15 MED ORDER — ONDANSETRON HCL 4 MG/2ML IJ SOLN
INTRAMUSCULAR | Status: AC
  Filled 2016-12-15: qty 2

## 2016-12-15 MED ORDER — SODIUM CHLORIDE 0.9 % IV SOLN
INTRAVENOUS | Status: DC
  Administered 2016-12-15: 11:00:00 via INTRAVENOUS

## 2016-12-15 MED ORDER — PROPOFOL 10 MG/ML IV BOLUS
INTRAVENOUS | Status: AC
  Filled 2016-12-15: qty 20

## 2016-12-15 MED ORDER — ATORVASTATIN CALCIUM 80 MG PO TABS
80.0000 mg | ORAL_TABLET | Freq: Every day | ORAL | Status: DC
  Administered 2016-12-16 – 2016-12-26 (×11): 80 mg via ORAL
  Filled 2016-12-15 (×11): qty 1

## 2016-12-15 MED ORDER — DOCUSATE SODIUM 100 MG PO CAPS: 100.0000 mg | ORAL_CAPSULE | Freq: Every day | ORAL | Status: DC

## 2016-12-15 SURGICAL SUPPLY — 93 items
BAG SNAP BAND KOVER 36X36 (MISCELLANEOUS) ×6 IMPLANT
BALLN STERLI SL OTW 2.5X80X150 (BALLOONS) ×6
BALLN STERLING OTW 3X150X150 (BALLOONS) ×6
BALLOON STERLING OTW 3X150X150 (BALLOONS) ×5 IMPLANT
BALLOON STRLNG OTW 2.5X80X150 (BALLOONS) ×5 IMPLANT
BANDAGE ACE 4X5 VEL STRL LF (GAUZE/BANDAGES/DRESSINGS) ×6 IMPLANT
BANDAGE ELASTIC 4 VELCRO ST LF (GAUZE/BANDAGES/DRESSINGS) ×6 IMPLANT
BLADE AVERAGE 25X9 (BLADE) IMPLANT
BLADE SAW SGTL 81X20 HD (BLADE) IMPLANT
BLADE SURG 11 STRL SS (BLADE) ×6 IMPLANT
BNDG GAUZE ELAST 4 BULKY (GAUZE/BANDAGES/DRESSINGS) ×6 IMPLANT
CANISTER SUCT 3000ML PPV (MISCELLANEOUS) ×6 IMPLANT
CATH ANGIO 5F BER2 65CM (CATHETERS) IMPLANT
CATH CROSS OVER TEMPO 5F (CATHETERS) ×6 IMPLANT
CATH CXI 2.6F 65 ST (CATHETERS) ×1
CATH CXI SUPP ANG 2.6FR 150CM (MICROCATHETER) ×6 IMPLANT
CATH OMNI FLUSH .035X70CM (CATHETERS) IMPLANT
CATH SPRT STRG 65X2.6FR BRD (CATHETERS) ×5 IMPLANT
CHLORAPREP W/TINT 26ML (MISCELLANEOUS) ×6 IMPLANT
CLIP LIGATING EXTRA MED SLVR (CLIP) ×6 IMPLANT
CLIP VESOCCLUDE SM WIDE 24/CT (CLIP) ×6 IMPLANT
COVER DOME SNAP 22 D (MISCELLANEOUS) ×6 IMPLANT
COVER PROBE W GEL 5X96 (DRAPES) ×6 IMPLANT
COVER SURGICAL LIGHT HANDLE (MISCELLANEOUS) ×6 IMPLANT
DERMABOND ADHESIVE PROPEN (GAUZE/BANDAGES/DRESSINGS) ×1
DERMABOND ADVANCED (GAUZE/BANDAGES/DRESSINGS) ×1
DERMABOND ADVANCED .7 DNX12 (GAUZE/BANDAGES/DRESSINGS) ×5 IMPLANT
DERMABOND ADVANCED .7 DNX6 (GAUZE/BANDAGES/DRESSINGS) ×5 IMPLANT
DEVICE TORQUE KENDALL .025-038 (MISCELLANEOUS) IMPLANT
DIAMONDBACK SOLID OAS 1.5MM (CATHETERS) ×6
DRAPE EXTREMITY T 121X128X90 (DRAPE) ×6 IMPLANT
DRAPE FEMORAL ANGIO 80X135IN (DRAPES) ×6 IMPLANT
DRAPE HALF SHEET 40X57 (DRAPES) ×6 IMPLANT
DRAPE INCISE IOBAN 66X45 STRL (DRAPES) ×12 IMPLANT
ELECT REM PT RETURN 9FT ADLT (ELECTROSURGICAL) ×6
ELECTRODE REM PT RTRN 9FT ADLT (ELECTROSURGICAL) ×5 IMPLANT
GAUZE SPONGE 4X4 12PLY STRL (GAUZE/BANDAGES/DRESSINGS) ×6 IMPLANT
GAUZE SPONGE 4X4 12PLY STRL LF (GAUZE/BANDAGES/DRESSINGS) ×6 IMPLANT
GAUZE SPONGE 4X4 16PLY XRAY LF (GAUZE/BANDAGES/DRESSINGS) ×6 IMPLANT
GLOVE BIO SURGEON STRL SZ7 (GLOVE) ×6 IMPLANT
GLOVE BIO SURGEON STRL SZ7.5 (GLOVE) ×6 IMPLANT
GLOVE BIOGEL PI IND STRL 7.0 (GLOVE) ×5 IMPLANT
GLOVE BIOGEL PI IND STRL 7.5 (GLOVE) ×5 IMPLANT
GLOVE BIOGEL PI INDICATOR 7.0 (GLOVE) ×1
GLOVE BIOGEL PI INDICATOR 7.5 (GLOVE) ×1
GOWN STRL REUS W/ TWL LRG LVL3 (GOWN DISPOSABLE) ×10 IMPLANT
GOWN STRL REUS W/ TWL XL LVL3 (GOWN DISPOSABLE) ×5 IMPLANT
GOWN STRL REUS W/TWL LRG LVL3 (GOWN DISPOSABLE) ×2
GOWN STRL REUS W/TWL XL LVL3 (GOWN DISPOSABLE) ×1
GUIDEWIRE ANGLED .035X150CM (WIRE) IMPLANT
GUIDEWIRE ANGLED .035X260CM (WIRE) ×12 IMPLANT
KIT BASIN OR (CUSTOM PROCEDURE TRAY) ×6 IMPLANT
KIT ENCORE 26 ADVANTAGE (KITS) ×6 IMPLANT
KIT ROOM TURNOVER OR (KITS) ×6 IMPLANT
LOOP VESSEL MAXI BLUE (MISCELLANEOUS) ×6 IMPLANT
LOOP VESSEL MINI RED (MISCELLANEOUS) ×6 IMPLANT
LUBRICANT VIPERSLIDE CORONARY (MISCELLANEOUS) ×6 IMPLANT
NEEDLE HYPO 25GX1X1/2 BEV (NEEDLE) ×12 IMPLANT
NEEDLE PERC 18GX7CM (NEEDLE) ×6 IMPLANT
NS IRRIG 1000ML POUR BTL (IV SOLUTION) ×6 IMPLANT
PACK GENERAL/GYN (CUSTOM PROCEDURE TRAY) ×6 IMPLANT
PACK PERIPHERAL VASCULAR (CUSTOM PROCEDURE TRAY) ×6 IMPLANT
PACK SURGICAL SETUP 50X90 (CUSTOM PROCEDURE TRAY) ×6 IMPLANT
PAD ARMBOARD 7.5X6 YLW CONV (MISCELLANEOUS) ×12 IMPLANT
PROTECTION STATION PRESSURIZED (MISCELLANEOUS) ×12
SET MICROPUNCTURE 5F STIFF (MISCELLANEOUS) ×12 IMPLANT
SHEATH AVANTI 11CM 5FR (MISCELLANEOUS) ×6 IMPLANT
SHEATH PINNACLE 6F 10CM (SHEATH) ×6 IMPLANT
SNARE GOOSENECK 10MM (VASCULAR PRODUCTS) ×6 IMPLANT
STATION PROTECTION PRESSURIZED (MISCELLANEOUS) ×10 IMPLANT
STOPCOCK MORSE 400PSI 3WAY (MISCELLANEOUS) ×12 IMPLANT
SUT ETHILON 3 0 PS 1 (SUTURE) ×6 IMPLANT
SUT MNCRL AB 4-0 PS2 18 (SUTURE) ×6 IMPLANT
SUT PROLENE 5 0 C 1 24 (SUTURE) ×12 IMPLANT
SUT VIC AB 2-0 CT1 27 (SUTURE) ×2
SUT VIC AB 2-0 CT1 TAPERPNT 27 (SUTURE) ×10 IMPLANT
SUT VIC AB 3-0 SH 27 (SUTURE) ×1
SUT VIC AB 3-0 SH 27X BRD (SUTURE) ×5 IMPLANT
SYR 10ML LL (SYRINGE) ×18 IMPLANT
SYR 20CC LL (SYRINGE) ×12 IMPLANT
SYR 30ML LL (SYRINGE) ×6 IMPLANT
SYR 5ML LL (SYRINGE) ×18 IMPLANT
SYR CONTROL 10ML LL (SYRINGE) IMPLANT
SYR MEDRAD MARK V 150ML (SYRINGE) IMPLANT
SYRINGE 20CC LL (MISCELLANEOUS) ×12 IMPLANT
SYSTEM DIMNDBCK SLD OAS 1.5MM (CATHETERS) ×5 IMPLANT
TOWEL GREEN STERILE (TOWEL DISPOSABLE) ×6 IMPLANT
TUBING HIGH PRESS EXTEN 6IN (TUBING) ×6 IMPLANT
UNDERPAD 30X30 (UNDERPADS AND DIAPERS) ×6 IMPLANT
WATER STERILE IRR 1000ML POUR (IV SOLUTION) ×6 IMPLANT
WIRE BENTSON .035X145CM (WIRE) ×12 IMPLANT
WIRE G V18X300CM (WIRE) ×12 IMPLANT
WIRE VIPER ADVANCE .017X335CM (WIRE) ×6 IMPLANT

## 2016-12-15 NOTE — Progress Notes (Signed)
Pt received from PACU. Pt and wife oriented to room and equipment. VSS - Phillips Monitor set to sequence. R groin site level 0. L foot dressing with serosanguinous drainage to krelex, but ace wrap clean, dry, intact. Pt verbalized understanding of NPO until 7:30pm. Right foot DP and PT doppler. L anterior tibial pulse doppler. Call bell within reach. Will continue to monitor.   Leonidas Rombergaitlin S Bumbledare, RN

## 2016-12-15 NOTE — Progress Notes (Signed)
Dr. Jean RosenthalJackson notified that patient has an ICD.  Dr. Jean RosenthalJackson stated that there was no reason to contact [employer].

## 2016-12-15 NOTE — Anesthesia Postprocedure Evaluation (Signed)
Anesthesia Post Note  Patient: Joshua [name]  Procedure(s) Performed: Procedure(s) (LRB): LOWER EXTREMITY ANGIOGRAM; PEDAL ACCESS (Left) REPAIR FALSE ANEURYSM (Right) ATHERECTOMY USING DIAMONDBACK 360 (Left) IRRIGATION AND DEBRIDEMENT FOOT (Left) BALLOON ANGIOPLASTY     Patient location during evaluation: PACU Anesthesia Type: Regional Level of consciousness: awake and alert Pain management: pain level controlled Vital Signs Assessment: post-procedure vital signs reviewed and stable Respiratory status: spontaneous breathing, nonlabored ventilation, respiratory function stable and patient connected to nasal cannula oxygen Cardiovascular status: blood pressure returned to baseline and stable Postop Assessment: no signs of nausea or vomiting Anesthetic complications: no    Last Vitals:  Vitals:   12/15/16 1805 12/15/16 1806  BP: (!) 141/51 (!) 137/50  Pulse: 62 62  Resp: 11 12  Temp:    SpO2: 100% 100%    Last Pain:  Vitals:   12/15/16 1745  PainSc: 5                  Jolissa Kapral,E. Eliana Lueth

## 2016-12-15 NOTE — Anesthesia Procedure Notes (Signed)
Procedure Name: Intubation Date/Time: 12/15/2016 1:17 PM Performed by: Romie MinusOCK, Shawntay Prest K Pre-anesthesia Checklist: Patient identified, Emergency Drugs available, Suction available and Patient being monitored Patient Re-evaluated:Patient Re-evaluated prior to induction Oxygen Delivery Method: Circle System Utilized Preoxygenation: Pre-oxygenation with 100% oxygen Induction Type: IV induction Ventilation: Mask ventilation without difficulty, Oral airway inserted - appropriate to patient size and Two handed mask ventilation required Grade View: Grade II Tube type: Oral Tube size: 7.5 mm Number of attempts: 1 Airway Equipment and Method: Stylet,  Oral airway and Video-laryngoscopy Placement Confirmation: ETT inserted through vocal cords under direct vision,  positive ETCO2 and breath sounds checked- equal and bilateral Secured at: 22 cm Tube secured with: Tape Dental Injury: Teeth and Oropharynx as per pre-operative assessment  Difficulty Due To: Difficulty was anticipated, Difficult Airway- due to reduced neck mobility and Difficult Airway- due to anterior larynx

## 2016-12-15 NOTE — Anesthesia Preprocedure Evaluation (Addendum)
Anesthesia Evaluation  Patient identified by MRN, date of birth, ID band Patient awake    Reviewed: Allergy & Precautions, NPO status , Patient's Chart, lab work & pertinent test results, reviewed documented beta blocker date and time   History of Anesthesia Complications Negative for: history of anesthetic complications  Airway Mallampati: III  TM Distance: >3 FB Neck ROM: Full    Dental  (+) Chipped, Dental Advisory Given   Pulmonary sleep apnea (BiPAP) , COPD, former smoker,    breath sounds clear to auscultation       Cardiovascular hypertension, Pt. on medications and Pt. on home beta blockers + CAD and + CABG  + Cardiac Defibrillator  Rhythm:Regular Rate:Normal  11/23/16 ECHO [facility]): MODERATE LV DYSFUNCTION (See above) WITH MILD LVH NORMAL RIGHT VENTRICULAR SYSTOLIC FUNCTION VALVULAR REGURGITATION: TRIVIAL AR, TRIVIAL MR, TRIVIAL PR, TRIVIAL TR NO VALVULAR STENOSIS   Neuro/Psych negative neurological ROS     GI/Hepatic negative GI ROS, Neg liver ROS,   Endo/Other  diabetes (glu 289), Insulin DependentMorbid obesitySjogren's  Renal/GU Dialysis and ESRFRenal disease (peritoneal)     Musculoskeletal   Abdominal (+) + obese,   Peds  Hematology  (+) Blood dyscrasia (Hb 10.5), , plavix   Anesthesia Other Findings   Reproductive/Obstetrics                           Anesthesia Physical Anesthesia Plan  ASA: III  Anesthesia Plan: MAC and Regional   Post-op Pain Management:    Induction:   PONV Risk Score and Plan: 1 and Ondansetron and Treatment may vary due to age or medical condition  Airway Management Planned: Natural Airway and Simple Face Mask  Additional Equipment:   Intra-op Plan:   Post-operative Plan:   Informed Consent: I have reviewed the patients History and Physical, chart, labs and discussed the procedure including the risks, benefits and alternatives for the proposed  anesthesia with the patient or authorized representative who has indicated his/her understanding and acceptance.   Dental advisory given  Plan Discussed with: CRNA and Surgeon  Anesthesia Plan Comments: (Plan routine monitors, ankle block )        Anesthesia Quick Evaluation

## 2016-12-15 NOTE — Op Note (Signed)
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 Randie Heinzain, MD Assistant: Leonides SakeBrian Chen, MD Procedure Performed: 1.  Open repair of right common femoral pseudoaneurysm 2.  US guided cannulation of left anterior tibial artery 3.  CSI orbital atherectomy of left AT with 1.5 diamondback 4.  Balloon angioplasty of left AT with 3mm balloon, Left DP with 2.515mm balloon  5.  Resection of left 3rd metatarsal head 6.  Right lower extremity angiogram   Indications:  76 year old male with a previous left peroneal artery revascularization with an angioplasty and subsequent third toe amputation. His amputation site has now broken down and he is indicated for possible retrograde access revascularization of his anterior tibial artery on the left and revision of his amputation site.   Findings: Right common femoral artery had approximately 3 cm pseudoaneurysm that was partially thrombosed. The opening of the femoral artery was properly repaired. The anterior tibial artery at the level of the ankle was noted be patent was easily cannulated. After completion angioplasty we had 0% residual stenosis in the anterior tibial artery and a palpable pulse at the level of the ankle. The peroneal artery also runoff to the ankle. The right lower extremity demonstrated less than 50% stenosis throughout the SFA that was non-flow-limiting and a peroneal artery only to the level of the ankle with no anterior tibial or posterior tibial arteries identified. Indications the left was necrotic after debridement we did have some healthy appearing tissue but not enough for primary closure.   Procedure:  The patient was identified in the holding area and taken to the operating room where his placed supine on the operating table  general endotracheal anesthesia was induced. He was sterilely prepped and draped in the bilateral lower extremities and timeout called. An bites were administered prior to initiating procedure. We used ultrasound to identify the right common femoral artery and noticed a partially thrombosed pseudoaneurysm there which approximated 3 cm. We then made a longitudinal incision overlying this dissected down to the inguinal ligament and unfortunately didn't enter some of this ligament into the retroperitoneum. We then peeled ligament off the pseudoaneurysm and then entered the pseudoaneurysm sharply. We did have bleeding and sponge 60s control bleeding proximally and distally. A single 5-0 Prolene suture was then placed in the common femoral artery and this was hemostatic. We then obtained hemostasis throughout the pseudoaneurysm cavity. We then cannulated the right common femoral artery with 18-gauge needle placed a Bentson wire followed by a 5 French sheath. We used a crossover catheter to go over the bifurcation and placed a Bentson wire on the left external iliac artery. We then used ultrasound to identify the left anterior tibial artery at the level of the ankle and this was cannulated with micropuncture needle followed by wire sheath. After removing the wire we did have antegrade arterial bleeding and the patient was given 10,000 units of heparin. We then used V 18 wire and see excited catheter to traverse the occluded anterior tibial artery into what appeared to be the popliteal artery and we performed angiogram demonstrated we were in fact intraluminal. From the right common femoral artery we then exchanged for long 6 French sheath into the SFA. Through that we then placed a 5 mm loop snare and were able to snare V 18 wire at the level of the popliteal artery.  This was pulled through and through and flossing technique. We then used a long CXI 018 catheter through and through and exchanged for a viper wire. The 1.5  CSI diamondback was then prepared and we used this in to 15 cm segments of the anterior tibial artery. Following this we performed balloon angioplasty with along 3 mm balloon of the entire anterior tibial artery at nominal pressure for 2 minutes. Completion angiogram demonstrated patency of the anterior to the artery without flow limiting stenosis but there was extravasation. We then pulled our wire back into the lumen and directed under V 18 with a CXI catheter into the dorsalis pedis artery. We then balloon angioplastied this with 2-1/2 mm balloon for 3 minutes and also held external pressure. After this was removed we again performed angiogram and demonstrated flow to the foot with very minimal extravasation. Satisfied with this we then removed our balloon and wire exchanged for a short 6 French sheath in the right common femoral artery. We then performed right lower extremity angiogram with the above findings of only peroneal artery runoff with no intervening segments in the right lower extremity. Satisfied with this we removed our 6 French sheath and cinched a pursestring suture 5-0 Prolene that we had placed prior. We then obtained hemostasis in this wound irrigated and closed in layers with 2-0 Vicryl interrupted 3-0 Vicryl for Monocryl. Dermabond was placed to the level of the skin. We then turned our attention to the previous left third toe amputation site. We debrided this with 10 blade down to what appeared to be healthy tissue and to the bone and we removed the third metatarsal head as well as the residual bone from that the way that existed. After we had healthy tissue obtained hemostasis and irrigated pack this with wet-to-dry dressing. Patient was then allowed awaken from anesthesia having tolerated the procedure well without immediate competition. WERE correct at completion.  Contrast 150 mL.      Keimya Briddell C. Randie Heinz, MD [facility] [place] Office: [phone] Pager:  [phone]

## 2016-12-15 NOTE — Progress Notes (Signed)
Dr. Jean RosenthalJackson notified of patient's CBG and other labs.  Also notified that patient had not taken his carvedilol today.  Received order for 12.5mg  carvedilol.

## 2016-12-15 NOTE — Progress Notes (Signed)
Dressing reinforced to LLE. ACE wrap with serosanguinous dressing. Left krelex from OR in place. Wrapped with additional krelex. Placed 4x4 gauze over spot that seemed to be draining the most on top of foot. Replaced ACE wrap. Oncoming RN aware.  Leonidas Rombergaitlin S Bumbledare, RN

## 2016-12-15 NOTE — Transfer of Care (Signed)
Immediate Anesthesia Transfer of Care Note  Patient: Joshua [name]  Procedure(s) Performed: Procedure(s): LOWER EXTREMITY ANGIOGRAM; PEDAL ACCESS (Left) REPAIR FALSE ANEURYSM (Right) ATHERECTOMY USING DIAMONDBACK 360 (Left) IRRIGATION AND DEBRIDEMENT FOOT (Left) BALLOON ANGIOPLASTY  Patient Location: PACU  Anesthesia Type:General  Level of Consciousness: awake, oriented and patient cooperative  Airway & Oxygen Therapy: Patient Spontanous Breathing and Patient connected to face mask oxygen  Post-op Assessment: Report given to RN and Post -op Vital signs reviewed and stable  Post vital signs: Reviewed  Last Vitals:  Vitals:   12/15/16 1017  BP: (!) 155/54  Pulse: 68  Resp: 20  Temp: (!) 36.4 C  SpO2: 97%    Last Pain: There were no vitals filed for this visit.       Complications: No apparent anesthesia complications

## 2016-12-15 NOTE — Consult Note (Signed)
[facility] Renal Consultation Note    Indication for Consultation:  Management of ESRD/peritoneal dialysis; anemia, hypertension/volume and secondary hyperparathyroidism  HPI: Joshua [name] is a 76 y.o. [name].   [name] with PMH significant for CAD, DM, HTN, PVD, COPD, CHF, and ESRD on peritoneal dialysis in [place] under the direction of Dr. Denny PeonBaveja who was admitted today for repair of right common femoral pseudoaneurysm, balloon angioplasty and resection of left 3rd metatarsal head by vascular surgery today due to critical left lower extremity ischemia and non-healing surgical wound.  We were consulted to help manage his peritoneal dialysis while he remains an inpatient.   Past Medical History:  Diagnosis Date  . AICD (automatic cardioverter/defibrillator) present   . CHF (congestive heart failure) (HCC)   . COPD (chronic obstructive pulmonary disease) (HCC)   . Coronary artery disease    CABG 2011  . Diabetes mellitus without complication (HCC)   . ESRD on peritoneal dialysis [facility](HCC)    [place].   Joshua Kitchen. History of peritonitis    PD cath related peritonitis in April 2017  . Hypertension   . Peripheral vascular disease (HCC)   . Sjogren's syndrome (HCC)    Per pt's wife, was diagnosed in 2016 during w/u for cause of renal failure prior to starting dialysis.  He had a rash on his chest apparently prompting this w/u.  Never had renal bx.  He was referred to [place] rheum [place] in [place] and per the wife he was treated with MTX and other medications but had side effects to "all of it" and isn't taking anything for this as of Jun 2017.   . Stroke [facility](HCC)    Past Surgical History:  Procedure Laterality Date  . ABDOMINAL AORTOGRAM W/LOWER EXTREMITY N/A 11/29/2016   Procedure: ABDOMINAL AORTOGRAM W/LOWER EXTREMITY;  Surgeon: Nada LibmanBrabham, Vance W, MD;  Location: [facility] INVAS[facility] CV LAB;  Service: Cardiovascular;  Laterality: N/A;   . AMPUTATION Left 11/29/2016   Procedure: LEFT THIRD TOE AMPUTATION;  Surgeon: Nada LibmanBrabham, Vance W, MD;  [facility];  Service: Vascular;  Laterality: Left;  . cardiac catherization with stent placement  2017  . CORONARY ARTERY BYPASS GRAFT  2011  . PERIPHERAL VASCULAR BALLOON ANGIOPLASTY  11/29/2016   Procedure: PERIPHERAL VASCULAR BALLOON ANGIOPLASTY;  Surgeon: Nada LibmanBrabham, Vance W, MD;  Location: [facility] INVASIVE CV LAB;  Service: Cardiovascular;;  LT Peroneal AT attempted unsuccessful   Family History:   Family History  Problem Relation Age of Onset  . Heart disease Father    Social History:  reports that he has quit smoking. He has never used smokeless tobacco. He reports that he does not drink alcohol or use drugs. Allergies  Allergen Reactions  . Lisinopril Hives  . Methotrexate Derivatives Other (See Comments)    blisters  . Sulfa Antibiotics Hives  . Tape Hives  . Vancomycin Other (See Comments)    Blisters    Prior to Admission medications   Medication Sig Start Date End Date Taking? Authorizing Provider  amoxicillin (AMOXIL) 500 MG capsule Take 2,000 mg by mouth See admin instructions. 1 hour prior to dental procedures   Yes [provider]  aspirin EC 81 MG tablet Take 81 mg by mouth daily.   Yes [provider]  atorvastatin (LIPITOR) 80 MG tablet Take 80 mg by mouth daily at 6 PM.   Yes [provider]  calcium acetate (PHOSLO) 667 MG  capsule Take 667 mg by mouth 3 (three) times daily with meals.   Yes [provider]  carvedilol (COREG) 12.5 MG tablet Take 12.5 mg by mouth 2 (two) times daily with a meal.   Yes [provider]  clopidogrel (PLAVIX) 75 MG tablet Take 75 mg by mouth daily.   Yes [provider]  docusate sodium (COLACE) 100 MG capsule Take 100 mg by mouth 2 (two) times daily.    Yes [provider]  furosemide (LASIX) 40 MG tablet Take 40 mg by mouth 2 (two) times daily.   Yes [provider]  insulin NPH Human (HUMULIN N,NOVOLIN N) 100 UNIT/ML injection Inject 70 Units into the skin at bedtime.   Yes [provider]  insulin regular (NOVOLIN R,HUMULIN R) 100 units/mL injection Inject 20 Units into the skin 3 (three) times daily before meals.   Yes [provider]  isosorbide mononitrate (IMDUR) 30 MG 24 hr tablet Take 30 mg by mouth daily.   Yes [provider]  losartan (COZAAR) 25 MG tablet Take 25 mg by mouth at bedtime.    Yes [provider]  nitroGLYCERIN (NITROSTAT) 0.4 MG SL tablet Place 0.4 mg under the tongue every 5 (five) minutes as needed for chest pain.   Yes [provider]  ondansetron (ZOFRAN) 4 MG tablet Take 4 mg by mouth every 6 (six) hours as needed for nausea or vomiting.   Yes [provider]  oxyCODONE-acetaminophen (PERCOCET/ROXICET) 5-325 MG tablet Take 1 tablet by mouth every 6 (six) hours as needed for severe pain. 11/30/16  Yes Raymond Gurney, PA-C  Vitamin D, Ergocalciferol, (DRISDOL) 50000 units CAPS capsule Take 50,000 Units by mouth every 30 (thirty) days.    Yes [provider]   Current Facility-Administered Medications  Medication Dose Route Frequency Provider Last Rate Last Dose  . 0.9 %  sodium chloride infusion   Intravenous Continuous Jairo Ben, MD 10 mL/hr at 12/15/16 1043    . 0.9 %  sodium chloride infusion   Intravenous Continuous Maeola Harman, MD      . Chlorhexidine Gluconate Cloth 2 % PADS 6 each  6 each Topical Once Maeola Harman, MD      . clopidogrel (PLAVIX) tablet 75 mg  75 mg Oral Daily Rhyne, Samantha J, PA-C      . fentaNYL (SUBLIMAZE) injection 25-50 mcg  25-50 mcg Intravenous Q5 min PRN Jairo Ben, MD   25 mcg at 12/15/16 1707  . meperidine (DEMEROL) injection 6.25-12.5 mg  6.25-12.5 mg Intravenous Q5 min PRN Jairo Ben, MD      . midazolam (VERSED) injection 0.5-2 mg  0.5-2 mg Intravenous Once PRN Jairo Ben,  MD      . nitroGLYCERIN 50 mg in dextrose 5 % 250 mL (0.2 mg/mL) infusion  0-200 mcg/min Intravenous To OR Maeola Harman, MD      . promethazine (PHENERGAN) injection 6.25-12.5 mg  6.25-12.5 mg Intravenous Q15 min PRN Jairo Ben, MD      . verapamil (ISOPTIN) injection 5 mg  5 mg Intravenous To OR Maeola Harman, MD       Labs: Basic Metabolic Panel:  Recent Labs Lab 12/15/16 1045  NA 135  K 4.0  CL 92*  GLUCOSE 276*  BUN 38*  CREATININE 5.80*   Liver Function Tests: No results for input(s): AST, ALT, ALKPHOS, BILITOT, PROT, ALBUMIN in the last 168 hours. No results for input(s): LIPASE, AMYLASE in the last 168  hours. No results for input(s): AMMONIA in the last 168 hours. CBC:  Recent Labs Lab 12/15/16 1045  HGB 10.5*  HCT 31.0*   Cardiac Enzymes: No results for input(s): CKTOTAL, CKMB, CKMBINDEX, TROPONINI in the last 168 hours. CBG:  Recent Labs Lab 12/15/16 1019 12/15/16 1219 12/15/16 1616  GLUCAP 289* 268* 196*   Iron Studies: No results for input(s): IRON, TIBC, TRANSFERRIN, FERRITIN in the last 72 hours. Studies/Results: No results found.  ROS: Pertinent items are noted in HPI. Physical Exam: Vitals:   12/15/16 1615 12/15/16 1630 12/15/16 1645 12/15/16 1700  BP: (!) 101/43 (!) 110/45 (!) 113/49 (!) 115/45  Pulse: 71 68 66 62  Resp: 12 (!) Temp: 98.1 F (36.7 C)     SpO2: 99% 99% 99% 99%  Weight:          Weight change:   Intake/Output Summary (Last 24 hours) at 12/15/16 1722 Last data filed at 12/15/16 1610  Gross per 24 hour  Intake              750 ml  Output              100 ml  Net              650 ml   BP (!) 115/45   Pulse 62   Temp 98.1 F (36.7 C)   Resp 19   Wt 92.1 kg (203 lb)   SpO2 99%   BMI 35.96 kg/m  General appearance: no distress Head: Normocephalic, without obvious abnormality, atraumatic Resp: clear to auscultation bilaterally Cardio: regular rate and rhythm, S1, S2 normal,  no murmur, click, rub or gallop GI: soft, non-tender; bowel sounds normal; no masses,  no organomegaly and PD catheter in LLQ without erythema or drainage at exit site Extremities: left leg wrapped no edema, LUE AVF +T/B Dialysis Access:  Dialysis Orders: Center: [place] [facility]  on [facility] . 6 exchanges/day one mid-day exchange.  2.5 liters of 2.5% per CCPD and 2 liters of 2.5% for last fill and mid day drain.  Assessment/Plan: 1.  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  ESRD -   Will continue with CCPD as outpatient prescription directs 3.  Hypertension/volume  - stable 4.  Anemia  - Hgb 10.5 will follow post op and hopefully can get ESA as an outpatient at his home unit 5.  Metabolic bone disease -  Continue with phoslo when taking po 6.  Nutrition - renal diet  [name], MD [facility], [facility] Pager [phone] 12/15/2016, 5:22 PM

## 2016-12-15 NOTE — H&P (Signed)
   History and Physical Update  The patient was interviewed and re-examined.  The patient's previous History and Physical has been reviewed and is unchanged from office visit. Plan for pedal access on left and likely toe amp site debridement.    Marshell Dilauro C. Randie Heinzain, MD [facility] [place] Office: [phone] Pager: [phone]   12/15/2016, 12:17 PM

## 2016-12-16 ENCOUNTER — Encounter (HOSPITAL_COMMUNITY): Payer: Self-pay | Admitting: Vascular Surgery

## 2016-12-16 LAB — GLUCOSE, CAPILLARY
GLUCOSE-CAPILLARY: 201 mg/dL — AB (ref 65–99)
Glucose-Capillary: 240 mg/dL — ABNORMAL HIGH (ref 65–99)
Glucose-Capillary: 231 mg/dL — ABNORMAL HIGH (ref 65–99)
Glucose-Capillary: 299 mg/dL — ABNORMAL HIGH (ref 65–99)

## 2016-12-16 LAB — RENAL FUNCTION PANEL
Albumin: 2 g/dL — ABNORMAL LOW (ref 3.5–5.0)
Anion gap: 9 (ref 5–15)
BUN: 44 mg/dL — AB (ref 6–20)
CHLORIDE: 97 mmol/L — AB (ref 101–111)
CO2: 28 mmol/L (ref 22–32)
CREATININE: 5.96 mg/dL — AB (ref 0.61–1.24)
Calcium: 8.1 mg/dL — ABNORMAL LOW (ref 8.9–10.3)
GFR calc Af Amer: 10 mL/min — ABNORMAL LOW (ref >60)
GFR, EST NON AFRICAN AMERICAN: 8 mL/min — AB (ref >60)
GLUCOSE: 252 mg/dL — AB (ref 65–99)
POTASSIUM: 4.4 mmol/L (ref 3.5–5.1)
Phosphorus: 4.5 mg/dL (ref 2.5–4.6)
Sodium: 134 mmol/L — ABNORMAL LOW (ref 135–145)

## 2016-12-16 LAB — CBC
HEMATOCRIT: 27.5 % — AB (ref 39.0–52.0)
Hemoglobin: 8.9 g/dL — ABNORMAL LOW (ref 13.0–17.0)
MCH: 31.4 pg (ref 26.0–34.0)
MCHC: 32.4 g/dL (ref 30.0–36.0)
MCV: 97.2 fL (ref 78.0–100.0)
PLATELETS: 296 10*3/uL (ref 150–400)
RBC: 2.83 MIL/uL — ABNORMAL LOW (ref 4.22–5.81)
RDW: 13 % (ref 11.5–15.5)
WBC: 11.9 10*3/uL — AB (ref 4.0–10.5)

## 2016-12-16 LAB — BASIC METABOLIC PANEL
Anion gap: 10 (ref 5–15)
BUN: 44 mg/dL — AB (ref 6–20)
CO2: 27 mmol/L (ref 22–32)
Calcium: 8.1 mg/dL — ABNORMAL LOW (ref 8.9–10.3)
Chloride: 97 mmol/L — ABNORMAL LOW (ref 101–111)
Creatinine, Ser: 6.01 mg/dL — ABNORMAL HIGH (ref 0.61–1.24)
GFR calc Af Amer: 9 mL/min — ABNORMAL LOW (ref >60)
GFR, EST NON AFRICAN AMERICAN: 8 mL/min — AB (ref >60)
GLUCOSE: 251 mg/dL — AB (ref 65–99)
POTASSIUM: 4.5 mmol/L (ref 3.5–5.1)
Sodium: 134 mmol/L — ABNORMAL LOW (ref 135–145)

## 2016-12-16 MED ORDER — DELFLEX-LC/2.5% DEXTROSE 394 MOSM/L IP SOLN: INTRAPERITONEAL | Status: DC

## 2016-12-16 MED ORDER — DELFLEX-LC/2.5% DEXTROSE 394 MOSM/L IP SOLN
INTRAPERITONEAL | Status: DC
  Administered 2016-12-16 – 2016-12-17 (×2): via INTRAPERITONEAL

## 2016-12-16 MED ORDER — HEPARIN 1000 UNIT/ML FOR PERITONEAL DIALYSIS: INTRAPERITONEAL | Status: DC | PRN

## 2016-12-16 MED ORDER — HEPARIN 1000 UNIT/ML FOR PERITONEAL DIALYSIS: 500.0000 [IU] | INTRAMUSCULAR | Status: DC | PRN

## 2016-12-16 MED ORDER — DELFLEX-LC/2.5% DEXTROSE 394 MOSM/L IP SOLN: Freq: Every day | INTRAPERITONEAL | Status: DC

## 2016-12-16 MED ORDER — GENTAMICIN SULFATE 0.1 % EX CREA
1.0000 "application " | TOPICAL_CREAM | Freq: Every day | CUTANEOUS | Status: DC
  Administered 2016-12-16 – 2016-12-20 (×7): 1 via TOPICAL
  Filled 2016-12-16: qty 15

## 2016-12-16 NOTE — Progress Notes (Signed)
Rehab Admissions Coordinator Note:  Patient was screened by Clois DupesBoyette, Donelda Mailhot Godwin for appropriateness for an Inpatient Acute Rehab Consult.  At this time, we are recommending Inpatient Rehab consult. Please place order for consult.  Clois DupesBoyette, Aramis Weil Godwin 12/16/2016, 8:27 PM  I can be reached at [phone].

## 2016-12-16 NOTE — Evaluation (Signed)
Physical Therapy Evaluation Patient Details Name: Joshua [name] MRN: [id number] DOB: 15-Mar-1941 Today's Date: 12/16/2016   History of Present Illness  Pt is a [age] yo male s/p L third toe amputation 11/29/16 and subsequent dehiscence, revision surgery on 12/11/16. PMH significant for CHF, COPD, CAD, CABG 2011, defirillator placement, DM2, ESRD on peritoneal dialysis, HTN and stroke.    Clinical Impression  Patient is s/p above surgery resulting in functional limitations due to the deficits listed below (see PT Problem List).Pt currently, modAx2 for bed mobility and minAx2 for transfers. Pt  Patient will benefit from skilled PT to increase their independence and safety with mobility to allow discharge to the venue listed below.       Follow Up Recommendations CIR    Equipment Recommendations  Other (comment) (to be determined at next venue)    Recommendations for Other Services Rehab consult     Precautions / Restrictions Precautions Precautions: Fall Restrictions Weight Bearing Restrictions: Yes LLE Weight Bearing: Partial weight bearing LLE Partial Weight Bearing Percentage or Pounds: Darco shoe      Mobility  Bed Mobility Overal bed mobility: Needs Assistance Bed Mobility: Supine to Sit     Supine to sit: Mod assist;+2 for physical assistance     General bed mobility comments: modA for LE mangement to floor and trunk to upright. once upright pt able to scoot to EoB   Transfers Overall transfer level: Needs assistance Equipment used: Rolling walker (2 wheeled) Transfers: Sit to/from [employer]Stand;Stand Pivot Transfers Sit to Stand: Min assist;+2 safety/equipment Stand pivot transfers: Min assist;From elevated surface       General transfer comment: good power up min Ax2 for steadying with RW.   Ambulation/Gait             General Gait Details: not attempt due to connection to dialysis machine      Balance Overall balance assessment: Needs  assistance Sitting-balance support: No upper extremity supported;Feet supported Sitting balance-Leahy Scale: Fair Sitting balance - Comments: pain in groin with movement   Standing balance support: Bilateral upper extremity supported Standing balance-Leahy Scale: Poor Standing balance comment: Bil UE support on RW                             Pertinent Vitals/Pain Pain Assessment: 0-10 Pain Score: 7  Pain Location: Left groin Pain Descriptors / Indicators: Sharp Pain Intervention(s): Limited activity within patient's tolerance;Repositioned    Home Living Family/patient expects to be discharged to:: Private residence Living Arrangements: Spouse/significant other Available Help at Discharge: Family;Available 24 hours/day Type of Home: Mobile home Home Access: Stairs to enter Entrance Stairs-Rails: Left Entrance Stairs-Number of Steps: 1 Home Layout: One level Home Equipment: Walker - 4 wheels;Cane - quad;Wheelchair - manual      Prior Function Level of Independence: Needs assistance   Gait / Transfers Assistance Needed: uses [name], RW  ADL's / Homemaking Assistance Needed: A for socks, can bath self in shower        Hand Dominance   Dominant Hand: Right    Extremity/Trunk Assessment   Upper Extremity Assessment Upper Extremity Assessment: Overall WFL for tasks assessed    Lower Extremity Assessment Lower Extremity Assessment: LLE deficits/detail LLE Deficits / Details: L foot and ankle ROM limited by surgical intervention, knee and hip strength grossly 4/5 LLE: Unable to fully assess due to immobilization       Communication   Communication: No difficulties  Cognition Arousal/Alertness: Awake/alert Behavior  During Therapy: WFL for tasks assessed/performed Overall Cognitive Status: Within Functional Limits for tasks assessed                                        General Comments General comments (skin integrity, edema, etc.): VSS,  pt attached to paused peritoneal dialysis throughout session.         Assessment/Plan    PT Assessment Patient needs continued PT services  PT Problem List Decreased range of motion;Decreased strength;Decreased activity tolerance;Decreased mobility;Cardiopulmonary status limiting activity;Pain       PT Treatment Interventions DME instruction;Gait training;Stair training;Functional mobility training;Therapeutic activities;Therapeutic exercise;Balance training;Patient/family education    PT Goals (Current goals can be found in the Care Plan section)  Acute Rehab PT Goals Patient Stated Goal: to rehab then home PT Goal Formulation: With patient Time For Goal Achievement: 12/16/16 Potential to Achieve Goals: Good    Frequency Min 3X/week        Co-evaluation PT/OT/SLP Co-Evaluation/Treatment: Yes Reason for Co-Treatment: For patient/therapist safety;Complexity of the patient's impairments (multi-system involvement) PT goals addressed during session: Mobility/safety with mobility;Balance OT goals addressed during session: ADL's and self-care;Strengthening/ROM       AM-PAC PT "6 Clicks" Daily Activity  Outcome Measure Difficulty turning over in bed (including adjusting bedclothes, sheets and blankets)?: Unable Difficulty moving from lying on back to sitting on the side of the bed? : Unable Difficulty sitting down on and standing up from a chair with arms (e.g., wheelchair, bedside commode, etc,.)?: Unable Help needed moving to and from a bed to chair (including a wheelchair)?: A Little Help needed walking in hospital room?: A Little Help needed climbing 3-5 steps with a railing? : A Lot 6 Click Score: 11    End of Session Equipment Utilized During Treatment: Gait belt Activity Tolerance: Patient tolerated treatment well Patient left: in chair;with call bell/phone within reach;with family/visitor present;with chair alarm set Nurse Communication: Mobility status PT Visit  Diagnosis: Unsteadiness on feet (R26.81);Other abnormalities of gait and mobility (R26.89);Muscle weakness (generalized) (M62.81);Difficulty in walking, not elsewhere classified (R26.2);Pain Pain - Right/Left: Left Pain - part of body: Ankle and joints of foot    Time: 1030-1053 PT Time Calculation (min) (ACUTE ONLY): 23 min   Charges:   PT Evaluation $PT Eval Moderate Complexity: 1 Mod     PT G Codes:        Keelon Zurn B. Beverely Risen PT, DPT Acute Rehabilitation  [phone] Pager [phone]    Elon Alas Fleet 12/16/2016, 3:15 PM

## 2016-12-16 NOTE — Progress Notes (Signed)
PT Cancellation Note  Patient Details Name: Joshua [name] MRN: [id number] DOB: 1940/10/24   Cancelled Treatment:    Reason Eval/Treat Not Completed: Patient at procedure or test/unavailable Pt undergoing peritoneal dialysis. Nursing to clarify with dialysis team when pt will appropriate to work with. Rehab will follow up today as able.   [name] PT, DPT Acute Rehabilitation  [phone] Pager [phone]  [name] 12/16/2016, 8:56 AM

## 2016-12-16 NOTE — Progress Notes (Addendum)
Progress Note  SUBJECTIVE:    POD #1  Some soreness right groin.   OBJECTIVE:   Vitals:   12/16/16 0300 12/16/16 0400  BP:  (!) 135/49  Pulse: 78 78  Resp: 17 19  Temp:  99.8 F (37.7 C)  SpO2: 97% 94%    Intake/Output Summary (Last 24 hours) at 12/16/16 0759 Last data filed at 12/15/16 2027  Gross per 24 hour  Intake              810 ml  Output              200 ml  Net              610 ml   Right groin with mild fullness. No pulsatility. Incision intact.  Strong right DP and PT signals Superficial eschar to dorsum left foot. Left 3rd toe amputation site with mild bleeding. Wound bed is clean. Bone is visible.     Strong left DP signal.   ASSESSMENT/PLAN:   76 y.o. male is s/p:  1.  Open repair of right common femoral pseudoaneurysm 2.  US guided cannulation of left anterior tibial artery 3.  CSI orbital atherectomy of left AT with 1.5 diamondback 4.  Balloon angioplasty of left AT with 3mm balloon, Left DP with 2.785mm balloon  5.  Resection of left 3rd metatarsal head 6.  Right lower extremity angiogram 1 Day Post-Op   Good doppler signals bilaterally.  Will have WOC place VAC to left 3rd amputation site today.  Weightbearing left heel only. Will order post op shoe.  Appreciate nephrology assistance with PD.  Will keep over the weekend and evaluate wound again on Monday. Patient understands that he may require further revision to toe amputation site.   Raymond GurneyKimberly A Trinh 12/16/2016 7:59 AM -- LABS:   CBC    Component Value Date/Time   WBC 11.9 (H) 12/16/2016 0550   HGB 8.9 (L) 12/16/2016 0550   HCT 27.5 (L) 12/16/2016 0550   PLT 296 12/16/2016 0550    BMET    Component Value Date/Time   NA 134 (L) 12/16/2016 0550   NA 134 (L) 12/16/2016 0550   K 4.4 12/16/2016 0550   K 4.5 12/16/2016 0550   CL 97 (L) 12/16/2016 0550   CL 97 (L) 12/16/2016 0550   CO2 28 12/16/2016 0550   CO2 27 12/16/2016 0550   GLUCOSE 252 (H) 12/16/2016 0550   GLUCOSE 251  (H) 12/16/2016 0550   BUN 44 (H) 12/16/2016 0550   BUN 44 (H) 12/16/2016 0550   CREATININE 5.96 (H) 12/16/2016 0550   CREATININE 6.01 (H) 12/16/2016 0550   CALCIUM 8.1 (L) 12/16/2016 0550   CALCIUM 8.1 (L) 12/16/2016 0550   GFRNONAA 8 (L) 12/16/2016 0550   GFRNONAA 8 (L) 12/16/2016 0550   GFRAA 10 (L) 12/16/2016 0550   GFRAA 9 (L) 12/16/2016 0550    COAG Lab Results  Component Value Date   INR 1.22 09/21/2015   No results found for: PTT  ANTIBIOTICS:   Anti-infectives    Start     Dose/Rate Route Frequency Ordered Stop   12/16/16 1200  cefUROXime (ZINACEF) 1.5 g in dextrose 5 % 50 mL IVPB     1.5 g 100 mL/hr over 30 Minutes Intravenous  Once 12/15/16 1812     12/15/16 1227  dextrose 5 % with cefUROXime (ZINACEF) ADS Med    Comments:  Forte, Lindsi   : cabinet override      12/15/16  1227 12/15/16 1325   12/15/16 1226  cefUROXime (ZINACEF) 1.5 g in dextrose 5 % 50 mL IVPB     1.5 g 100 mL/hr over 30 Minutes Intravenous 30 min pre-op 12/15/16 1226 12/15/16 1325       Maris Berger, PA-C [facility] Office: [phone] Pager: [phone] 12/16/2016 7:59 AM   I have independently interviewed and examined patient and agree with PA assessment and plan above. Appears to have viable wound bed and place wound VAC and keeping it over the weekend for reevaluation on Monday. He understands he may need further operation on this left foot. He does have very strong signals distally in the foot. We'll continue aspirin and Plavix.  Mohsin Crum C. Randie Heinz, MD [facility] [place] Office: [phone] Pager: [phone]

## 2016-12-16 NOTE — Progress Notes (Signed)
Assessment/Plan: 1.  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  ESRD -   Will continue with CCPD as outpatient prescription directs, cont PD 3.  Hypertension/volume  - stable 4.  Anemia  - Hgb 10.5 will follow post op and hopefully can get ESA as an outpatient at his home unit  Subjective: Interval History: Ongoing issues with PD in hospital due to equipment.  Will resume this eve. Objective: Vital signs in last 24 hours: Temp:  [97.5 F (36.4 C)-99.8 F (37.7 C)] 97.5 F (36.4 C) (09/07 1305) Pulse Rate:  [59-96] 87 (09/07 1400) Resp:  [11-24] 24 (09/07 1400) BP: (98-156)/(46-130) 103/49 (09/07 1305) SpO2:  [93 %-100 %] 99 % (09/07 1400) Weight:  [92.1 kg (203 lb 0.7 oz)-94.5 kg (208 lb 4.8 oz)] 94.5 kg (208 lb 4.8 oz) (09/07 0400) Weight change:   Intake/Output from previous day: 09/06 0701 - 09/07 0700 In: 810 [I.V.:810] Out: 200 [Urine:100; Blood:100] Intake/Output this shift: Total I/O In: 7410 [P.O.:360; Other:7000; IV Piggyback:50] Out: 4816 [Other:4816]  General appearance: alert and cooperative Resp: clear to auscultation bilaterally Cardio: regular rate and rhythm, S1, S2 normal, no murmur, click, rub or gallop GI: soft, non-tender; bowel sounds normal; no masses,  no organomegaly and protub  Lab Results:  Recent Labs  12/15/16 1927 12/16/16 0550  WBC 10.9* 11.9*  HGB 9.6* 8.9*  HCT 29.1* 27.5*  PLT 282 296   BMET:  Recent Labs  12/15/16 1045 12/15/16 1927 12/16/16 0550  NA 135  --  134*  134*  K 4.0  --  4.5  4.4  CL 92*  --  97*  97*  CO2  --   --  27  28  GLUCOSE 276*  --  251*  252*  BUN 38*  --  44*  44*  CREATININE 5.80* 5.72* 6.01*  5.96*  CALCIUM  --   --  8.1*  8.1*   No results for input(s): PTH in the last 72 hours. Iron Studies: No results for input(s): IRON, TIBC, TRANSFERRIN, FERRITIN in the last 72 hours. Studies/Results: No results found.  Scheduled: . aspirin EC  81 mg Oral Daily  .  atorvastatin  80 mg Oral q1800  . calcium acetate  667 mg Oral TID WC  . carvedilol  12.5 mg Oral BID WC  . clopidogrel  75 mg Oral Daily  . docusate sodium  100 mg Oral BID  . furosemide  40 mg Oral BID  . gentamicin cream  1 application Topical Daily  . heparin  5,000 Units Subcutaneous Q8H  . insulin aspart  0-15 Units Subcutaneous TID WC  . insulin aspart  20 Units Subcutaneous TID WC  . insulin NPH Human  35 Units Subcutaneous QHS  . isosorbide mononitrate  30 mg Oral Daily  . losartan  25 mg Oral QHS  . mouth rinse  15 mL Mouth Rinse BID  . pantoprazole  40 mg Oral Daily  . [START ON 01/08/2017] Vitamin D (Ergocalciferol)  50,000 Units Oral Q30 days      LOS: 1 day   Luvina Poirier C 12/16/2016,5:23 PM

## 2016-12-16 NOTE — Progress Notes (Signed)
[facility]PD+ tx initiated via tenckhoff w/o problem. Prolonged delay of tx start d/t orders were never released by dayshift primary nurse triggering the order to print off to let unit know there was a PD tx to be ran.  Was Called by primary nurse @ 0005 and got her VM @ 0016 when I got home from work. Turned around and came straight back. When trying to assess where the breakdown in communication was in letting HD unit know there were PD orders for this pt, the primary nurse stated that at the time PD orders were entered by MD, pt was in PACU and she doesn't know if the dayshift primary nurse saw the orders or knew she had to release them for the orders to print off in HD unit to let us know about tx. VSS, report given to Corey Skainsonna Niemela, RN

## 2016-12-16 NOTE — Evaluation (Signed)
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 PMH significant for CHF, COPD, CAD, CABG 2011, defirillator placement, DM2, ESRD on peritoneal dialysis, HTN and stroke.     Clinical Impression   This 76 yo male admitted and underwent above presents to acute OT with decreased balance, decreased AROM/PROM RLE, increased pain, and WB'ing on LLE only in Darco shoe thus affecting his PLOF of being totally independent with all of his basic ADLs. He will benefit from acute OT with follow up OT on CIR to get back to PLOF.     Follow Up Recommendations  CIR;Supervision/Assistance - 24 hour    Equipment Recommendations  Other (comment) (TBD next venue)       Precautions / Restrictions Precautions Precautions: Fall Restrictions Weight Bearing Restrictions: Yes LLE Weight Bearing: Partial weight bearing LLE Partial Weight Bearing Percentage or Pounds: Darco shoe      Mobility Bed Mobility Overal bed mobility: Needs Assistance Bed Mobility: Supine to Sit     Supine to sit: Mod assist;+2 for physical assistance     General bed mobility comments: modA for LE mangement to floor and trunk to upright. once upright pt able to scoot to EoB   Transfers Overall transfer level: Needs assistance Equipment used: Rolling walker (2 wheeled) Transfers: Sit to/from [employer] Sit to Stand: Min assist;+2 safety/equipment   Squat pivot transfers: Min assist;+2 safety/equipment     General transfer comment: good power up min Ax2 for steadying with RW.     Balance Overall balance assessment: Needs assistance Sitting-balance support: No upper extremity supported;Feet supported Sitting balance-Leahy Scale: Fair Sitting balance - Comments: pain in groin with  movement   Standing balance support: Bilateral upper extremity supported Standing balance-Leahy Scale: Poor Standing balance comment: Bil UE support on RW                           ADL either performed or assessed with clinical judgement   ADL Overall ADL's : Needs assistance/impaired Eating/Feeding: Independent;Sitting   Grooming: Set up;Sitting   Upper Body Bathing: Set up;Sitting   Lower Body Bathing: Maximal assistance Lower Body Bathing Details (indicate cue type and reason): min A +2 sit<>stand Upper Body Dressing : Set up;Sitting   Lower Body Dressing: Total assistance Lower Body Dressing Details (indicate cue type and reason): min A +2 sit<>stand Toilet Transfer: Minimal assistance;+2 for physical assistance;Stand-pivot;RW   Toileting- Clothing Manipulation and Hygiene: Moderate assistance Toileting - Clothing Manipulation Details (indicate cue type and reason): Min A +2 sit<>stand                          Pertinent Vitals/Pain Pain Assessment: 0-10 Pain Score: 7  Pain Location: Left groin Pain Descriptors / Indicators: Sharp Pain Intervention(s): Limited activity within patient's tolerance;Repositioned     Hand Dominance Right   Extremity/Trunk Assessment Upper Extremity Assessment Upper Extremity Assessment: Overall WFL for tasks assessed   Lower Extremity Assessment Lower Extremity Assessment: LLE deficits/detail LLE Deficits / Details: L foot and ankle ROM limited by surgical intervention, knee and hip strength grossly 4/5 LLE: Unable to fully assess due to immobilization       Communication Communication Communication: No difficulties   Cognition Arousal/Alertness: Awake/alert Behavior During Therapy:  WFL for tasks assessed/performed Overall Cognitive Status: Within Functional Limits for tasks assessed                                     General Comments  VSS, pt attached to paused peritoneal dialysis throughout  session.             Home Living Family/patient expects to be discharged to:: Private residence Living Arrangements: Spouse/significant other Available Help at Discharge: Family;Available 24 hours/day Type of Home: Mobile home Home Access: Stairs to enter Entrance Stairs-Number of Steps: 1 Entrance Stairs-Rails: Left Home Layout: One level     Bathroom Shower/Tub: Walk-in shower;Door   Foot LockerBathroom Toilet: Standard     Home Equipment: Environmental consultantWalker - 4 wheels;Cane - quad;Wheelchair - manual          Prior Functioning/Environment Level of Independence: Needs assistance  Gait / Transfers Assistance Needed: uses [name], RW ADL's / Homemaking Assistance Needed: A for socks, can bath self in shower            OT Problem List: Decreased strength;Decreased range of motion;Impaired balance (sitting and/or standing);Pain      OT Treatment/Interventions: Self-care/ADL training;Therapeutic activities;Patient/family education;DME and/or AE instruction;Balance training    OT Goals(Current goals can be found in the care plan section) Acute Rehab OT Goals Patient Stated Goal: to rehab then home OT Goal Formulation: With patient/family Time For Goal Achievement: 12/23/16 Potential to Achieve Goals: Good  OT Frequency: Min 2X/week           Co-evaluation PT/OT/SLP Co-Evaluation/Treatment: Yes Reason for Co-Treatment: For patient/therapist safety;Complexity of the patient's impairments (multi-system involvement) PT goals addressed during session: Mobility/safety with mobility;Balance OT goals addressed during session: ADL's and self-care;Strengthening/ROM      AM-PAC PT "6 Clicks" Daily Activity     Outcome Measure Help from another person eating meals?: None Help from another person taking care of personal grooming?: A Little Help from another person toileting, which includes using toliet, bedpan, or urinal?: A Lot Help from another person bathing (including washing, rinsing,  drying)?: A Lot Help from another person to put on and taking off regular upper body clothing?: A Little Help from another person to put on and taking off regular lower body clothing?: Total 6 Click Score: 15   End of Session Equipment Utilized During Treatment: Gait belt;Rolling walker (Darco shoe) Nurse Communication: Mobility status  Activity Tolerance: Patient tolerated treatment well Patient left: in chair;with call bell/phone within reach;with family/visitor present;with chair alarm set  OT Visit Diagnosis: Unsteadiness on feet (R26.81);Pain Pain - Right/Left: Left Pain - part of body: Leg                Time: 1030-1055 OT Time Calculation (min): 25 min Charges:  OT General Charges $OT Visit: 1 Visit OT Evaluation $OT Eval Moderate Complexity: [address] Mod Cathy Kuzey [place], [place] [phone] 12/16/2016

## 2016-12-16 NOTE — Progress Notes (Signed)
OT Cancellation Note  Patient Details Name: Joshua [name] MRN: [id number] DOB: Apr 17, 1940   Cancelled Treatment:    Reason Eval/Treat Not Completed: Patient at procedure or test/ unavailable. Pt undergoing peritoneal dialysis. Nursing to clarify with dialysis team when pt will appropriate to work with. Rehab will follow up at later time.   Evette GeorgesLeonard, Kharisma Glasner Eva [phone] 12/16/2016, 9:17 AM

## 2016-12-16 NOTE — Progress Notes (Signed)
Inpatient Diabetes Program Recommendations  AACE/ADA: New Consensus Statement on Inpatient Glycemic Control (2015)  Target Ranges:  Prepandial:   less than 140 mg/dL      Peak postprandial:   less than 180 mg/dL (1-2 hours)      Critically ill patients:  140 - 180 mg/dL   Review of Glycemic Control  DM 2 Home: NPH 70 units QHS, Novolog 20 units tid Current: NPH 35 units QHS, Novolog Moderate Correction 0-15 units tid + Novolog 20 units tid meal coverage  Inpatient Diabetes Program Recommendations:    NPH not given last night charted as patient/family refused. Glucose in the 200's today. No changes to current regimen. Will watch trends.  Thanks,  Christena DeemShannon Elleah Hemsley RN, MSN, [facility] Inpatient Diabetes Coordinator Team Pager [phone] (8a-5p)

## 2016-12-16 NOTE — Progress Notes (Signed)
PD tx terminated at 1445 d/t power failure, this RN unable to recover tx or perform stat drain as options are not selectable on machine.  Will swap PD machines for this evenings tx.  Plan to re initiate PD tx evening of 9/7.  Will continue to monitor.

## 2016-12-16 NOTE — Consult Note (Addendum)
WOC Nurse wound consult note Reason for Consult: Vascular team following for assessment and plan of care to left foot wound.  Requested to apply Vac to wound. Wound type: Full thickness post-op wound to left foot; amputation site between toes  Measurement: 8X1.5X3cm Wound bed: dusky red Drainage (amount, consistency, odor) small amt pink drainage, no odor Periwound: red moist skin surrounding,  There is a red dry partial thickness abrasion to the anterior foot; 3XX.1cm. Dressing procedure/placement/frequency: Applied Mepitel contact layer and one piece of black foam with barrier ring surrounding the wound to maintain a seal.  125mm cont suction applied and patient tolerated with minimal amt discomfort after meds given earlier.WOC will plan to change dressing on Monday. Joshua Mcgeeawn Hodges Treiber MSN, RN, CWOCN, [place]hamWCN-AP, CNS [phone]

## 2016-12-17 LAB — HEPATITIS B SURFACE ANTIGEN: HEP B S AG: NEGATIVE

## 2016-12-17 LAB — GLUCOSE, CAPILLARY
GLUCOSE-CAPILLARY: 377 mg/dL — AB (ref 65–99)
Glucose-Capillary: 238 mg/dL — ABNORMAL HIGH (ref 65–99)

## 2016-12-17 LAB — CBC
HCT: 22.2 % — ABNORMAL LOW (ref 39.0–52.0)
Hemoglobin: 7.4 g/dL — ABNORMAL LOW (ref 13.0–17.0)
MCH: 32 pg (ref 26.0–34.0)
MCHC: 33.3 g/dL (ref 30.0–36.0)
MCV: 96.1 fL (ref 78.0–100.0)
PLATELETS: 260 10*3/uL (ref 150–400)
RBC: 2.31 MIL/uL — AB (ref 4.22–5.81)
RDW: 13.2 % (ref 11.5–15.5)
WBC: 20.6 10*3/uL — AB (ref 4.0–10.5)

## 2016-12-17 LAB — HEPATITIS B SURFACE ANTIBODY,QUALITATIVE: HEP B S AB: NONREACTIVE

## 2016-12-17 MED ORDER — DEXTROSE 5 % IV SOLN
500.0000 mg | INTRAVENOUS | Status: DC
  Administered 2016-12-18 – 2016-12-24 (×7): 500 mg via INTRAVENOUS
  Filled 2016-12-17 (×7): qty 0.5

## 2016-12-17 MED ORDER — LINEZOLID 600 MG/300ML IV SOLN
600.0000 mg | Freq: Two times a day (BID) | INTRAVENOUS | Status: DC
  Administered 2016-12-17 – 2016-12-22 (×8): 600 mg via INTRAVENOUS
  Filled 2016-12-17 (×10): qty 300

## 2016-12-17 MED ORDER — DEXTROSE 5 % IV SOLN
1.0000 g | INTRAVENOUS | Status: DC
  Filled 2016-12-17: qty 1

## 2016-12-17 MED ORDER — LINEZOLID 600 MG/300ML IV SOLN
600.0000 mg | Freq: Once | INTRAVENOUS | Status: AC
  Administered 2016-12-17: 600 mg via INTRAVENOUS
  Filled 2016-12-17: qty 300

## 2016-12-17 MED ORDER — DEXTROSE 5 % IV SOLN
1.0000 g | Freq: Once | INTRAVENOUS | Status: AC
  Administered 2016-12-17: 1 g via INTRAVENOUS
  Filled 2016-12-17: qty 1

## 2016-12-17 NOTE — Significant Event (Signed)
Rapid Response Event Note RN called for change in mental status  Overview: Time Called: 0012 Arrival Time: 0020 Event Type: Neurologic  Initial Focused Assessment: On arrival pt lying supine in bed, extremely hot to touch, alert and oriented to person, place, and time, MAE, no neuro deficits noted, Dr. Darrick [name] at bedside, pt moaned when Dr. Darrick [name] palpated RLQ, BP 144/87, HR 102, RR 24, 93% RA, 100% on 2 L [place], rectal temp 104, lugs clear through out.   Interventions: Tylenol 650 mg PR, new orders for CBD, blood cultures, culture of PD fluid, and start Ceftaz Plan of Care (if not transferred): Continue to monitor pt, advised to call if needed.  Event Summary: Name of Physician Notified: Dr. Darrick [name]  at [phone]    at    Outcome: Stayed in room and stabalized     AsharokenSHULAR, [name]

## 2016-12-17 NOTE — Progress Notes (Addendum)
[facility] [place]  Subjective  - Doing OK over all.   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  Most likely source of fever is PD cath Needs culture of PD fluid, plan for this to be done today Start Vanc Ceftaz Check CBC Blood cultures Tylenol for fever Nursing to replace wound vac to left amputation site today    Clinton GallantCOLLINS, EMMA [facility]EN 12/17/2016 8:13 AM -- Agree with above.  Cultures of peritoneal fluid just sent.  Leukocytosis 21. Groin incision and foot clean.  Overall feels better and less confused. Unknown source for fever currently.  Does not make much urine so UTI unlikely.  No respiratory compromise or cough so doubt pneumonia.  Continue broad spectrum antibiotics for now. VAC on left foot today  Fabienne Brunsharles Jadasia Haws, MD [facility] [place] Office: [phone] Pager: [phone]  Laboratory Lab Results:  Recent Labs  12/16/16 0550 12/17/16 0206  WBC 11.9* 20.6*  HGB 8.9* 7.4*  HCT 27.5* 22.2*  PLT 296 260   BMET  Recent Labs  12/15/16 1045 12/15/16 1927 12/16/16 0550  NA 135  --  134*  134*  K 4.0  --  4.5  4.4  CL 92*  --  97*  97*  CO2  --   --  27  28  GLUCOSE 276*  --  251*  252*  BUN 38*  --  44*  44*  CREATININE 5.80* 5.72* 6.01*  5.96*  CALCIUM  --   --  8.1*  8.1*    COAG Lab Results  Component Value  Date   INR 1.22 09/21/2015   No results found for: PTT

## 2016-12-17 NOTE — Progress Notes (Signed)
Around 2354, patient's wife called and told this nurse that "something isn't right with her husband". Found patient to have a change in mental status. Patient would waver in and out to knowing where he was at, why he was here. On one instance, patient could state his birth date and the turn around and could not remember it. Rapid response called. Respirations were in high 20's but other wise patient's vitals stable. Patient had 104 temp rectally. Tylenol given rectally and ice packs placed in groin area and under left armpit. Notified Dr. Darrick PennaFields of patient's status. Orders were placed for cultures and labs. Will continue to monitor.

## 2016-12-17 NOTE — Progress Notes (Signed)
PD tx completed, no technical issues noted overnight.  Net UF 725 mL.  Effluent cultures drawn and sent.  Will continue to monitor.

## 2016-12-17 NOTE — Progress Notes (Addendum)
Pharmacy Antibiotic Note  [name] is a 76 y.o. male admitted on 12/15/2016 for vascular intervention of right lower extremity pseudoaneurysm and resection of left 3rd metatarsal.  Patient has history of ESRD and is on CCPD PTA, which has been continued inpatient.   Now with altered mental status and fever of 104. Pharmacy has been consulted for vancomycin dosing. Also ordered ceftazidime per MD. Unable to get PD fluid cultures until AM, but antibiotics to be started now per Dr. Darrick PennaFields.   WBC is markedly increased from 11.9 to 20.6.  *Noted patient has vancomycin allergy causing blisters. Confirmed this with patient wife who states the reaction happened last year. Spoke with Dr. Darrick PennaFields and will just order Linezolid x1 tonight.   Plan: Linezolid 600 mg IV x1  Ceftazidime 1g IV x1, then 500 mg IV q24 hrs (adjusted for PD) Monitor PD schedule, clinical picture, and culture results F/u with primary team/ID regarding abx plan   Height: 5\' 9"  (175.3 cm) Weight: 208 lb 4.8 oz (94.5 kg) IBW/kg (Calculated) : 70.7  Temp (24hrs), Avg:99.6 F (37.6 C), Min:97.5 F (36.4 C), Max:104 F (40 C)   Recent Labs Lab 12/15/16 1045 12/15/16 1927 12/16/16 0550  WBC  --  10.9* 11.9*  CREATININE 5.80* 5.72* 6.01*  5.96*    Estimated Creatinine Clearance: 12.1 mL/min (A) (by C-G formula based on SCr of 5.96 mg/dL (H)).    Allergies  Allergen Reactions  . Lisinopril Hives  . Methotrexate Derivatives Other (See Comments)    blisters  . Sulfa Antibiotics Hives  . Tape Hives  . Vancomycin Other (See Comments)    Blisters     Antimicrobials this admission: Cefuroxime 9/6 >> 9/8 Linezolid x1 Ceftaz 9/8 >>   Microbiology results: pending   York CeriseKatherine [name], PharmD Clinical Pharmacist 12/17/16 2:50 AM

## 2016-12-17 NOTE — Progress Notes (Signed)
AM CBG of 257 verified on glucometer.   Leonidas Rombergaitlin S Bumbledare, RN

## 2016-12-17 NOTE — Progress Notes (Signed)
CCPD tx initiated via tenckhoff w/o problem, VSS, report given to Sherrian Diversaitlin Bumble, RN

## 2016-12-17 NOTE — Progress Notes (Signed)
Assessment/Plan: 1. 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 ESRD- Will continue with CCPD 3. Hypertension/volume- stable 4. Anemia (ABLA)- Hgb drop to 7.4, recheck in AM 5. Nausea 6. Fever spike to 101.6  Subjective: Interval History: Reports clear PD fluid  Objective: Vital signs in last 24 hours: Temp:  [98.1 F (36.7 C)-104 F (40 C)] 98.1 F (36.7 C) (09/08 0825) Pulse Rate:  [73-107] 87 (09/08 1056) Resp:  [14-29] 19 (09/08 1056) BP: (105-144)/(42-87) 105/46 (09/08 0800) SpO2:  [92 %-100 %] 98 % (09/08 1056) Weight:  [95.4 kg (210 lb 5.1 oz)] 95.4 kg (210 lb 5.1 oz) (09/08 0400) Weight change: 3.32 kg (7 lb 5.1 oz)  Intake/Output from previous day: 09/07 0701 - 09/08 0700 In: 7410 [P.O.:360; IV Piggyback:50] Out: 4816  Intake/Output this shift: Total I/O In: [id number] [Other:12495] Out: 15789 [Other:15789]  Chest wall: no tenderness Cardio: regular rate and rhythm, S1, S2 normal, no murmur, click, rub or gallop GI: soft, non-tender; bowel sounds normal; no masses,  no organomegaly  Lab Results:  Recent Labs  12/16/16 0550 12/17/16 0206  WBC 11.9* 20.6*  HGB 8.9* 7.4*  HCT 27.5* 22.2*  PLT 296 260   BMET:  Recent Labs  12/15/16 1045 12/15/16 1927 12/16/16 0550  NA 135  --  134*  134*  K 4.0  --  4.5  4.4  CL 92*  --  97*  97*  CO2  --   --  27  28  GLUCOSE 276*  --  251*  252*  BUN 38*  --  44*  44*  CREATININE 5.80* 5.72* 6.01*  5.96*  CALCIUM  --   --  8.1*  8.1*   No results for input(s): PTH in the last 72 hours. Iron Studies: No results for input(s): IRON, TIBC, TRANSFERRIN, FERRITIN in the last 72 hours. Studies/Results: No results found.  Scheduled: . aspirin EC  81 mg Oral Daily  . atorvastatin  80 mg Oral q1800  . calcium acetate  667 mg Oral TID WC  . carvedilol  12.5 mg Oral BID WC  . clopidogrel  75 mg Oral Daily  . docusate sodium  100 mg Oral BID  . furosemide  40 mg Oral BID  .  gentamicin cream  1 application Topical Daily  . heparin  5,000 Units Subcutaneous Q8H  . insulin aspart  0-15 Units Subcutaneous TID WC  . insulin aspart  20 Units Subcutaneous TID WC  . insulin NPH Human  35 Units Subcutaneous QHS  . isosorbide mononitrate  30 mg Oral Daily  . losartan  25 mg Oral QHS  . mouth rinse  15 mL Mouth Rinse BID  . pantoprazole  40 mg Oral Daily  . [START ON 01/08/2017] Vitamin D (Ergocalciferol)  50,000 Units Oral Q30 days     LOS: 2 days   Dutchess Crosland C 12/17/2016,2:02 PM

## 2016-12-17 NOTE — Progress Notes (Signed)
Talked to [facility], RN for dialysis about collecting body fluid culture from the peritoneal fluid. Matt spoke with Dr. Hyman HopesWebb (nephrology) and it was agreed that the culture could wait until the patient is taken off of the dialysis in AM. Dr. Darrick PennaFields was made aware that the culture would be collected in AM. Will call dialysis in AM to let them know that a culture will need to be collected. Will continue to monitor.

## 2016-12-17 NOTE — Progress Notes (Signed)
[facility] [place]  Subjective  - called to see pt for altered mental status   Objective (!) 144/87 (!) 102 (!) 104 F (40 C) (Rectal) (!) 24 92%  Intake/Output Summary (Last 24 hours) at 12/17/16 0116 Last data filed at 12/16/16 1441  Gross per 24 hour  Intake             7410 ml  Output             4816 ml  Net             2594 ml   Neuro: alert and oriented to year place and situation, according to wife this comes and goes.  Moves all extremities no facial asymmetry. Abdomen: distended with PD fluid, mild right lower abdomen tenderness, Right groin incision healing Extremities: left foot VAC removed, wound is overall clean no purulent drainage Resp: no work of breathing O2 sat 98%  Assessment/Planning: Altered mental status with fever of 104 Most likely source of fever is PD cath Needs culture of PD fluid Start Vanc Ceftaz Check CBC Blood cultures Tylenol for fever Will recheck later today if mental status not improved will consider head CT but most likely this is fever related. Wife updated at bedside  Fabienne BrunsFields, Charles 12/17/2016 1:16 AM --  Laboratory Lab Results:  Recent Labs  12/15/16 1927 12/16/16 0550  WBC 10.9* 11.9*  HGB 9.6* 8.9*  HCT 29.1* 27.5*  PLT 282 296   BMET  Recent Labs  12/15/16 1045 12/15/16 1927 12/16/16 0550  NA 135  --  134*  134*  K 4.0  --  4.5  4.4  CL 92*  --  97*  97*  CO2  --   --  27  28  GLUCOSE 276*  --  251*  252*  BUN 38*  --  44*  44*  CREATININE 5.80* 5.72* 6.01*  5.96*  CALCIUM  --   --  8.1*  8.1*    COAG Lab Results  Component Value Date   INR 1.22 09/21/2015   No results found for: PTT

## 2016-12-18 LAB — COMPREHENSIVE METABOLIC PANEL
ALK PHOS: 60 U/L (ref 38–126)
ALT: 16 U/L — ABNORMAL LOW (ref 17–63)
ANION GAP: 11 (ref 5–15)
AST: 21 U/L (ref 15–41)
Albumin: 1.6 g/dL — ABNORMAL LOW (ref 3.5–5.0)
BUN: 37 mg/dL — ABNORMAL HIGH (ref 6–20)
CALCIUM: 7.9 mg/dL — AB (ref 8.9–10.3)
CO2: 27 mmol/L (ref 22–32)
Chloride: 94 mmol/L — ABNORMAL LOW (ref 101–111)
Creatinine, Ser: 6.36 mg/dL — ABNORMAL HIGH (ref 0.61–1.24)
GFR calc non Af Amer: 8 mL/min — ABNORMAL LOW (ref >60)
GFR, EST AFRICAN AMERICAN: 9 mL/min — AB (ref >60)
Glucose, Bld: 276 mg/dL — ABNORMAL HIGH (ref 65–99)
Potassium: 3.1 mmol/L — ABNORMAL LOW (ref 3.5–5.1)
SODIUM: 132 mmol/L — AB (ref 135–145)
Total Bilirubin: 0.4 mg/dL (ref 0.3–1.2)
Total Protein: 5.2 g/dL — ABNORMAL LOW (ref 6.5–8.1)

## 2016-12-18 LAB — GLUCOSE, CAPILLARY
GLUCOSE-CAPILLARY: 335 mg/dL — AB (ref 65–99)
GLUCOSE-CAPILLARY: 286 mg/dL — AB (ref 65–99)
GLUCOSE-CAPILLARY: 287 mg/dL — AB (ref 65–99)
Glucose-Capillary: 159 mg/dL — ABNORMAL HIGH (ref 65–99)
Glucose-Capillary: 338 mg/dL — ABNORMAL HIGH (ref 65–99)

## 2016-12-18 LAB — CBC
HCT: 21.5 % — ABNORMAL LOW (ref 39.0–52.0)
HEMOGLOBIN: 7.1 g/dL — AB (ref 13.0–17.0)
MCH: 31.8 pg (ref 26.0–34.0)
MCHC: 33 g/dL (ref 30.0–36.0)
MCV: 96.4 fL (ref 78.0–100.0)
PLATELETS: 248 10*3/uL (ref 150–400)
RBC: 2.23 MIL/uL — ABNORMAL LOW (ref 4.22–5.81)
RDW: 12.9 % (ref 11.5–15.5)
WBC: 17 10*3/uL — ABNORMAL HIGH (ref 4.0–10.5)

## 2016-12-18 LAB — PREPARE RBC (CROSSMATCH)

## 2016-12-18 LAB — ABO/RH: ABO/RH(D): A POS

## 2016-12-18 MED ORDER — SODIUM CHLORIDE 0.9 % IV SOLN: Freq: Once | INTRAVENOUS | Status: AC

## 2016-12-18 MED ORDER — MAGIC MOUTHWASH
5.0000 mL | Freq: Four times a day (QID) | ORAL | Status: DC | PRN
  Administered 2016-12-18 – 2016-12-26 (×13): 5 mL via ORAL
  Filled 2016-12-18 (×17): qty 5

## 2016-12-18 MED ORDER — METOCLOPRAMIDE HCL 5 MG/ML IJ SOLN
10.0000 mg | Freq: Four times a day (QID) | INTRAMUSCULAR | Status: DC
  Administered 2016-12-18 – 2016-12-22 (×17): 10 mg via INTRAVENOUS
  Filled 2016-12-18 (×17): qty 2

## 2016-12-18 NOTE — Progress Notes (Signed)
Assessment/Plan: 1. 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 ESRD- Will continue with CCPD, use 2.5%/4.25 today(weight increasing) 3. Hypertension/volume- stable 4. Anemia (ABLA)- Hgb drop to 7.4, now 7.1, give PBCs 5. Nausea, will check GB ultrasound 6. Fever spike to 101.6   Subjective: Interval History: Nauseated, Hgb still falling  Objective: Vital signs in last 24 hours: Temp:  [97.9 F (36.6 C)-100.6 F (38.1 C)] 98.7 F (37.1 C) (09/09 1219) Pulse Rate:  [67-92] 72 (09/09 1219) Resp:  [16-26] 17 (09/09 1219) BP: (104-124)/(45-57) 124/48 (09/09 1219) SpO2:  [93 %-100 %] 100 % (09/09 1219) Weight:  [95.2 kg (209 lb 14.1 oz)-97.8 kg (215 lb 9.8 oz)] 97.8 kg (215 lb 9.8 oz) (09/09 0400) Weight change: -0.2 kg (-7.1 oz)  Intake/Output from previous day: 09/08 0701 - 09/09 0700 In: [id number] [P.O.:240; IV Piggyback:300] Out: 15789  Intake/Output this shift: Total I/O In: 240 [P.O.:240] Out: -   Back: symmetric, no curvature. ROM normal. No CVA tenderness. Resp: clear to auscultation bilaterally GI: mild right tenderness  Lab Results:  Recent Labs  12/17/16 0206 12/18/16 0403  WBC 20.6* 17.0*  HGB 7.4* 7.1*  HCT 22.2* 21.5*  PLT 260 248   BMET:  Recent Labs  12/16/16 0550 12/18/16 0403  NA 134*  134* 132*  K 4.5  4.4 3.1*  CL 97*  97* 94*  CO2 27  28 27   GLUCOSE 251*  252* 276*  BUN 44*  44* 37*  CREATININE 6.01*  5.96* 6.36*  CALCIUM 8.1*  8.1* 7.9*   No results for input(s): PTH in the last 72 hours. Iron Studies: No results for input(s): IRON, TIBC, TRANSFERRIN, FERRITIN in the last 72 hours. Studies/Results: No results found.  Scheduled: . aspirin EC  81 mg Oral Daily  . atorvastatin  80 mg Oral q1800  . calcium acetate  667 mg Oral TID WC  . carvedilol  12.5 mg Oral BID WC  . clopidogrel  75 mg Oral Daily  . docusate sodium  100 mg Oral BID  . furosemide  40 mg Oral BID  . gentamicin cream  1  application Topical Daily  . heparin  5,000 Units Subcutaneous Q8H  . insulin aspart  0-15 Units Subcutaneous TID WC  . insulin aspart  20 Units Subcutaneous TID WC  . insulin NPH Human  35 Units Subcutaneous QHS  . isosorbide mononitrate  30 mg Oral Daily  . losartan  25 mg Oral QHS  . mouth rinse  15 mL Mouth Rinse BID  . metoCLOPramide (REGLAN) injection  10 mg Intravenous Q6H  . pantoprazole  40 mg Oral Daily  . [START ON 01/08/2017] Vitamin D (Ergocalciferol)  50,000 Units Oral Q30 days     LOS: 3 days   Ignacia Gentzler C 12/18/2016,1:49 PM

## 2016-12-18 NOTE — Progress Notes (Signed)
No right groin hematoma Left and right foot pink and warm No foot wounds Will need pop pt bypass. To determine timing next week when Dr Randie Heinzain returns  Fabienne Brunsharles Mayrene Bastarache, MD [facility] [place] Office: [phone] Pager: [phone]

## 2016-12-18 NOTE — Progress Notes (Addendum)
[facility] [place]  Subjective  -  Nausea.   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  Joshua GallantCOLLINS, [name] [facility] 12/18/2016 8:31 AM -- Agree with above.  Continue antibiotics for now.  Fabienne Brunsharles Fields, MD [facility] [place] Office: [phone] Pager: [phone]  Laboratory Lab Results:  Recent Labs  12/17/16 0206 12/18/16 0403  WBC 20.6* 17.0*  HGB 7.4* 7.1*  HCT 22.2* 21.5*  PLT 260 248   BMET  Recent Labs  12/16/16 0550 12/18/16 0403  NA 134*  134* 132*  K 4.5  4.4 3.1*  CL 97*  97* 94*  CO2 27  28 27   GLUCOSE 251*  252* 276*  BUN 44*  44* 37*  CREATININE 6.01*  5.96* 6.36*  CALCIUM 8.1*  8.1* 7.9*    COAG Lab Results  Component Value Date   INR 1.22 09/21/2015   No results found for: PTT

## 2016-12-19 ENCOUNTER — Other Ambulatory Visit (HOSPITAL_COMMUNITY): Payer: Medicare Other

## 2016-12-19 ENCOUNTER — Inpatient Hospital Stay (HOSPITAL_COMMUNITY): Payer: Medicare Other

## 2016-12-19 LAB — GLUCOSE, CAPILLARY
GLUCOSE-CAPILLARY: 479 mg/dL — AB (ref 65–99)
GLUCOSE-CAPILLARY: 439 mg/dL — AB (ref 65–99)
GLUCOSE-CAPILLARY: 220 mg/dL — AB (ref 65–99)
GLUCOSE-CAPILLARY: 170 mg/dL — AB (ref 65–99)
Glucose-Capillary: 454 mg/dL — ABNORMAL HIGH (ref 65–99)
Glucose-Capillary: 373 mg/dL — ABNORMAL HIGH (ref 65–99)

## 2016-12-19 LAB — TYPE AND SCREEN
ABO/RH(D): A POS
ANTIBODY SCREEN: NEGATIVE
Unit division: 0
Unit division: 0

## 2016-12-19 LAB — BPAM RBC
BLOOD PRODUCT EXPIRATION DATE: 201810012359
BLOOD PRODUCT EXPIRATION DATE: 201810012359
ISSUE DATE / TIME: 201809091549
ISSUE DATE / TIME: 201809092028
UNIT TYPE AND RH: 6200
Unit Type and Rh: 6200

## 2016-12-19 LAB — CBC
HEMATOCRIT: 28.5 % — AB (ref 39.0–52.0)
Hemoglobin: 9.6 g/dL — ABNORMAL LOW (ref 13.0–17.0)
MCH: 31.2 pg (ref 26.0–34.0)
MCHC: 33.7 g/dL (ref 30.0–36.0)
MCV: 92.5 fL (ref 78.0–100.0)
Platelets: 239 10*3/uL (ref 150–400)
RBC: 3.08 MIL/uL — ABNORMAL LOW (ref 4.22–5.81)
RDW: 15.2 % (ref 11.5–15.5)
WBC: 10.7 10*3/uL — ABNORMAL HIGH (ref 4.0–10.5)

## 2016-12-19 LAB — GLUCOSE, RANDOM: Glucose, Bld: 469 mg/dL — ABNORMAL HIGH (ref 65–99)

## 2016-12-19 MED ORDER — INSULIN ASPART 100 UNIT/ML ~~LOC~~ SOLN
0.0000 [IU] | Freq: Every day | SUBCUTANEOUS | Status: DC
  Administered 2016-12-20: 4 [IU] via SUBCUTANEOUS

## 2016-12-19 MED ORDER — INSULIN GLARGINE 100 UNIT/ML ~~LOC~~ SOLN
50.0000 [IU] | Freq: Every day | SUBCUTANEOUS | Status: DC
  Administered 2016-12-19 – 2016-12-20 (×2): 50 [IU] via SUBCUTANEOUS
  Filled 2016-12-19 (×3): qty 0.5

## 2016-12-19 MED ORDER — INSULIN ASPART 100 UNIT/ML ~~LOC~~ SOLN
20.0000 [IU] | Freq: Three times a day (TID) | SUBCUTANEOUS | Status: DC
  Administered 2016-12-19 – 2016-12-22 (×2): 20 [IU] via SUBCUTANEOUS

## 2016-12-19 MED ORDER — DELFLEX-LC/4.25% DEXTROSE 483 MOSM/L IP SOLN: INTRAPERITONEAL | Status: DC

## 2016-12-19 MED ORDER — INSULIN ASPART 100 UNIT/ML ~~LOC~~ SOLN
0.0000 [IU] | Freq: Three times a day (TID) | SUBCUTANEOUS | Status: DC
Start: 2016-12-19 — End: 2016-12-23
  Administered 2016-12-19: 20 [IU] via SUBCUTANEOUS
  Administered 2016-12-19: 7 [IU] via SUBCUTANEOUS
  Administered 2016-12-20: 3 [IU] via SUBCUTANEOUS
  Administered 2016-12-20: 20 [IU] via SUBCUTANEOUS
  Administered 2016-12-20: 11 [IU] via SUBCUTANEOUS
  Administered 2016-12-21: 3 [IU] via SUBCUTANEOUS
  Administered 2016-12-21 (×2): 20 [IU] via SUBCUTANEOUS
  Administered 2016-12-22: 3 [IU] via SUBCUTANEOUS
  Administered 2016-12-23: 7 [IU] via SUBCUTANEOUS

## 2016-12-19 NOTE — Progress Notes (Signed)
Assessment/Plan: 1. 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 ESRD- Will continue with CCPD, use 2.5%/4.25 again today (weight increasing) 3. Hypertension/volume- stable 4. Anemia (ABLA)- Hgb up to 9.6 after pRBCs. 5. Nausea, GB US with 6 mm gallbladder polyp. 6. Fever spike to 101.6- on linezolid/ ceftaz.     Subjective: Interval History: Reports incisional soreness.  RUQ US without cholecystitis but did show gallbladder polyp.    Objective: Vital signs in last 24 hours: Temp:  [98.2 F (36.8 C)-99.4 F (37.4 C)] 99.4 F (37.4 C) (09/10 0400) Pulse Rate:  [59-83] 83 (09/10 1154) Resp:  [12-21] 16 (09/10 0800) BP: (104-156)/(45-54) 142/48 (09/10 1154) SpO2:  [96 %-100 %] 98 % (09/10 0800) Weight:  [96.1 kg (211 lb 13.8 oz)] 96.1 kg (211 lb 13.8 oz) (09/10 0830) Weight change:   Intake/Output from previous day: 09/09 0701 - 09/10 0700 In: [id number] [P.O.:580; I.V.:90; Blood:1195; IV Piggyback:650] Out: 16945 [Urine:100] Intake/Output this shift: Total I/O In: [id number] [Other:14505] Out: 16247 [Other:16247]  Back: symmetric, no curvature. ROM normal. No CVA tenderness. Resp: clear to auscultation bilaterally GI: mild right tenderness  Lab Results:  Recent Labs  12/18/16 0403 12/19/16 0310  WBC 17.0* 10.7*  HGB 7.1* 9.6*  HCT 21.5* 28.5*  PLT 248 239   BMET:   Recent Labs  12/18/16 0403 12/19/16 0714  NA 132*  --   K 3.1*  --   CL 94*  --   CO2 27  --   GLUCOSE 276* 469*  BUN 37*  --   CREATININE 6.36*  --   CALCIUM 7.9*  --    No results for input(s): PTH in the last 72 hours. Iron Studies: No results for input(s): IRON, TIBC, TRANSFERRIN, FERRITIN in the last 72 hours. Studies/Results: [place] Abdomen Limited  Result Date: 12/19/2016 CLINICAL DATA:  3-4 days of nausea with fever.  History of diabetes. EXAM: ULTRASOUND ABDOMEN LIMITED RIGHT UPPER QUADRANT COMPARISON:  None in PACs  FINDINGS: Gallbladder: The gallbladder is adequately distended. There is an echogenic non mobile nonshadowing focus near the gallbladder neck compatible with a polyp measuring up 6 mm in diameter. There is no gallbladder wall thickening, pericholecystic fluid, or positive sonographic Murphy's sign. Common bile duct: Diameter: 4.5 mm. Liver: No focal lesion identified. Within normal limits in parenchymal echogenicity. Portal vein is patent on color Doppler imaging with normal direction of blood flow towards the liver. IMPRESSION: Probable 6 mm gallbladder polyp near the neck. No sonographic evidence of acute cholecystitis. Electronically Signed   By: David  SwazilandJordan M.D.   On: 12/19/2016 12:27    Scheduled: . aspirin EC  81 mg Oral Daily  . atorvastatin  80 mg Oral q1800  . calcium acetate  667 mg Oral TID WC  . carvedilol  12.5 mg Oral BID WC  . clopidogrel  75 mg Oral Daily  . docusate sodium  100 mg Oral BID  . furosemide  40 mg Oral BID  . gentamicin cream  1 application Topical Daily  . heparin  5,000 Units Subcutaneous Q8H  . insulin aspart  0-20 Units Subcutaneous TID WC  . insulin aspart  0-5 Units Subcutaneous QHS  . insulin aspart  20 Units Subcutaneous TID WC  . insulin glargine  50 Units Subcutaneous Daily  . isosorbide mononitrate  30 mg Oral Daily  . losartan  25 mg Oral QHS  . mouth rinse  15 mL Mouth Rinse BID  . metoCLOPramide (  REGLAN) injection  10 mg Intravenous Q6H  . pantoprazole  40 mg Oral Daily  . [START ON 01/08/2017] Vitamin D (Ergocalciferol)  50,000 Units Oral Q30 days     LOS: 4 days   [name] 12/19/2016,1:30 PM

## 2016-12-19 NOTE — Progress Notes (Signed)
Dr. Darrick PennaFields paged and made aware of CBG 479. Order to give the 15 units of Novolog on the S.S. He was aware that only cover up to 400 on S.S. And get stat glucose. R.N. ware

## 2016-12-19 NOTE — Consult Note (Addendum)
WOC Nurse wound consult note Reason for Consult: Vascular team following for assessment and plan of care to left foot wound. They removed Vac dressing this am to assess wound; refer to their progress notes.  There is slough/eschar in the wound and they plan for further surgery later this week.  Replaced Vac dressing until that time.   Wound type: Full thickness post-op wound to left foot; amputation site between toes  Wound bed: dusky red with slough/eschar to inner wound bed. Drainage (amount, consistency, odor) mod amt pink drainage in the cannister, no odor Periwound: red moist skin surrounding,  previously noted abrasion to the anterior foot has resolved.  Macerated skin to posterior foot is white, loose and peeling. Dressing procedure/placement/frequency: Applied one piece of black foam with barrier ring surrounding the wound to maintain a seal.  125mm cont suction applied and patient tolerated with minimal amt discomfort after meds given earlier. Will await further orders from the vascular team. Cammie Mcgeeawn Norman Piacentini MSN, RN, CWOCN, CWCN-AP, CNS [phone]

## 2016-12-19 NOTE — Progress Notes (Signed)
CCPD+ tx initiated via tenckhoff w/o problem, pt and wife didn't want to do "pause option" of tx and I called and spoke w/ Dr. Darrick Pennaeterding and made their wishes known and he explained why they wanted the "Pause option" and I relayed that to pt and they agreed they would for tonight and then will discuss w/ Dr. Signe ColtUpton in the morning., VSS, report given to Debara PickettKathryn Allen, RN

## 2016-12-19 NOTE — Care Management Important Message (Signed)
Important Message  Patient Details  Name: Joshua KitchenJames D Mcdaid MRN: [id number] Date of Birth: Aug 30, 1940   Medicare Important Message Given:  Yes    Aztlan Coll Abena 12/19/2016, 10:04 AM

## 2016-12-19 NOTE — Progress Notes (Signed)
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 PMH significant for CHF, COPD, CAD, CABG 2011, defirillator placement, DM2, ESRD on peritoneal dialysis, HTN and stroke.      PT Comments    Patient is making gradual progress toward mobility goals. Pt tolerated ambulating 26ft this session until fatigued. Pt required min/mod A for all mobility and able to maintain PWB L LE. Current plan remains appropriate.    Follow Up Recommendations  CIR     Equipment Recommendations  Other (comment) (to be determined at next venue)    Recommendations for Other Services Rehab consult     Precautions / Restrictions Precautions Precautions: Fall Restrictions Weight Bearing Restrictions: Yes LLE Weight Bearing: Partial weight bearing LLE Partial Weight Bearing Percentage or Pounds:  (Darco shoe)    Mobility  Bed Mobility               General bed mobility comments: pt OOB in chair upon arrival  Transfers Overall transfer level: Needs assistance Equipment used: Rolling walker (2 wheeled) Transfers: Sit to/from Stand Sit to Stand: Mod assist         General transfer comment: assistance for weight shifting and balance while transitioning hand placement and coming into standing; cues for safe hand placement and technique to maintain PWB L LE  Ambulation/Gait Ambulation/Gait assistance: Mod assist Ambulation Distance (Feet): 6 Feet Assistive device: Rolling walker (2 wheeled) Gait Pattern/deviations: Step-to pattern;Decreased stance time - left;Decreased step length - right;Decreased weight shift to left Gait velocity: decreased   General Gait Details: multimodal cues for posture and vc for sequencing, proximity of RW, and technique to maintain PWB status   Stairs             Wheelchair Mobility    Modified Rankin (Stroke Patients Only)       Balance Overall balance assessment: Needs assistance Sitting-balance support: No upper extremity supported;Feet supported Sitting balance-Leahy Scale: Fair     Standing balance support: Bilateral upper extremity supported Standing balance-Leahy Scale: Poor                              Cognition Arousal/Alertness: Awake/alert Behavior During Therapy: WFL for tasks assessed/performed Overall Cognitive Status: Within Functional Limits for tasks assessed                                        Exercises      General Comments General comments (skin integrity, edema, etc.): therapist demonstrated bilat LE and chair push ups and encouraged pt to work on this evening       Pertinent Vitals/Pain Pain Assessment: Faces Faces Pain Scale: Hurts a little bit Pain Location: Left groin Pain Descriptors / Indicators: Sore Pain Intervention(s): Limited activity within patient's tolerance;Monitored during session;Repositioned    Home Living                      Prior Function            PT Goals (current goals can now be found in the care plan section) Acute Rehab PT Goals PT Goal Formulation: With patient Time For Goal Achievement: 12/16/16 Potential to Achieve  Goals: Good Progress towards PT goals: Progressing toward goals    Frequency    Min 3X/week      PT Plan Current plan remains appropriate    Co-evaluation              AM-PAC PT "6 Clicks" Daily Activity  Outcome Measure  Difficulty turning over in bed (including adjusting bedclothes, sheets and blankets)?: Unable Difficulty moving from lying on back to sitting on the side of the bed? : Unable Difficulty sitting down on and standing up from a chair with arms (e.g., wheelchair, bedside commode, etc,.)?: Unable Help needed moving to and from a bed to chair (including a wheelchair)?: A  Little Help needed walking in hospital room?: A Lot Help needed climbing 3-5 steps with a railing? : A Lot 6 Click Score: 10    End of Session Equipment Utilized During Treatment: Gait belt Activity Tolerance: Patient limited by fatigue Patient left: in chair;with call bell/phone within reach;with chair alarm set Nurse Communication: Mobility status PT Visit Diagnosis: Unsteadiness on feet (R26.81);Other abnormalities of gait and mobility (R26.89);Muscle weakness (generalized) (M62.81);Difficulty in walking, not elsewhere classified (R26.2);Pain Pain - Right/Left: Left Pain - part of body: Ankle and joints of foot     Time: [id number] PT Time Calculation (min) (ACUTE ONLY): 23 min  Charges:  $Gait Training: 8-22 mins $Therapeutic Activity: 8-22 mins                    G Codes:       [name], PTA Pager: [phone]     [name] 12/19/2016, 2:23 PM

## 2016-12-19 NOTE — Care Management Note (Signed)
Case Management Note Donn PieriniKristi Lexandra Rettke RN, BSN Unit 4E-Case Manager [phone]  Patient Details  Name: Joshua [name] MRN: [id number] Date of Birth: 1940/05/23  Subjective/Objective:   Pt admitted with PAD/gangrene of toe, s/p Open repair of right common femoral pseudo-aneurysm and toe amputation- pt may still need further amputation- currently with wound VAC- possible return to OR later this week.                 Action/Plan: PTA pt lived at home with spouse- hx of Peritoneal dialysis- CM to follow for d/c needs  Expected Discharge Date:                  Expected Discharge Plan:     In-House Referral:     Discharge planning Services     Post Acute Care Choice:    Choice offered to:     DME Arranged:    DME Agency:     HH Arranged:    HH Agency:     Status of Service:     If discussed at [employer] Length of Stay Meetings, dates discussed:    Discharge Disposition:   Additional Comments:  Darrold SpanWebster, Joshua Gladu Hall, RN 12/19/2016, 12:13 PM

## 2016-12-19 NOTE — Progress Notes (Signed)
Occupational Therapy Treatment Patient Details Name: [name] MRN: [id number] DOB: 10/05/1940 Today's Date: 12/19/2016    History of present illness Pt is a 76 yo male s/p L third toe amputation 11/29/16 and subsequent dehiscence, revision surgery on 12/11/16. PMH significant for CHF, COPD, CAD, CABG 2011, defirillator placement, DM2, ESRD on peritoneal dialysis, HTN and stroke.     OT comments  Pt demonstrating progress toward OT goals this session. Educated pt concerning importance of completing self-care activities as independently as possible for recovery and he verbalizes and demonstrates understanding. He was able to assist with LB dressing tasks with max assist this session and complete UB bathing task with supervision for safety and rest breaks due to decreased activity tolerance for ADL.  Facilitated improved B UE strength as a precursor to standing ADL tasks and ADL transfers with chair-push-ups and sit<>stands this session. Pt motivated to participate in therapy session and continues to be an excellent candidate for CIR level therapies.    Follow Up Recommendations  CIR;Supervision/Assistance - 24 hour    Equipment Recommendations  Other (comment) (TBD at next venue of care)    Recommendations for Other Services      Precautions / Restrictions Precautions Precautions: Fall Restrictions Weight Bearing Restrictions: Yes LLE Weight Bearing: Partial weight bearing LLE Partial Weight Bearing Percentage or Pounds:  (Darco shoe)       Mobility Bed Mobility Overal bed mobility: Needs Assistance Bed Mobility: Supine to Sit     Supine to sit: Mod assist;+2 for physical assistance     General bed mobility comments: pt OOB in chair upon arrival  Transfers Overall transfer level: Needs assistance Equipment used: Rolling walker (2 wheeled) Transfers: Sit to/from Stand Sit to Stand: Mod assist         General transfer comment: assistance for weight shifting and  balance while transitioning hand placement and coming into standing; cues for safe hand placement and technique to maintain PWB L LE    Balance Overall balance assessment: Needs assistance Sitting-balance support: No upper extremity supported;Feet supported Sitting balance-Leahy Scale: Fair Sitting balance - Comments: Tires easily and requires rest breaks in between seated activities without back support.    Standing balance support: Bilateral upper extremity supported Standing balance-Leahy Scale: Poor Standing balance comment: Requires external assistance or UE support in standing.                            ADL either performed or assessed with clinical judgement   ADL Overall ADL's : Needs assistance/impaired Eating/Feeding: Sitting;Independent Eating/Feeding Details (indicate cue type and reason): Pt can independently feed himself but was having his wife feed him on OT arrival.  Grooming: Set up;Sitting Grooming Details (indicate cue type and reason): Becomes winded when completing seated ADL tasks.  Upper Body Bathing: Supervision/ safety;Sitting Upper Body Bathing Details (indicate cue type and reason): Fatigues easily         Lower Body Dressing: Maximal assistance;Sit to/from stand Lower Body Dressing Details (indicate cue type and reason): Able to assist therapist to doff Darco shoe               General ADL Comments: Able to complete multiple sit<>stand to facilitate improved B UE strength and to prepare for improved independence with toileting hygiene tasks.      Vision Baseline Vision/History: Wears glasses Wears Glasses: At all times Patient Visual Report: No change from baseline Vision Assessment?: No apparent visual deficits  Perception     Praxis      Cognition Arousal/Alertness: Awake/alert Behavior During Therapy: WFL for tasks assessed/performed Overall Cognitive Status: Within Functional Limits for tasks assessed                                           Exercises Other Exercises Other Exercises: Educated pt on chair push-ups to facilitate improved B UE strength in preparation for ADL participation. Pt able to complete 10 repetitions with rest break after 7 reps.  Other Exercises: Facilitated improved balance, B UE strength, and B LE strength with 5 sit<> stand transfers as a precursor activity to ADL participation for toileting tasks.    Shoulder Instructions       General Comments therapist demonstrated bilat LE and chair push ups and encouraged pt to work on this evening     Pertinent Vitals/ Pain       Pain Assessment: Faces Faces Pain Scale: Hurts a little bit Pain Location: Left groin Pain Descriptors / Indicators: Sore Pain Intervention(s): Limited activity within patient's tolerance;Monitored during session;Repositioned  Home Living                                          Prior Functioning/Environment              Frequency  Min 2X/week        Progress Toward Goals  OT Goals(current goals can now be found in the care plan section)  Progress towards OT goals: Progressing toward goals  Acute Rehab OT Goals Patient Stated Goal: to rehab then home OT Goal Formulation: With patient/family Time For Goal Achievement: 12/23/16 Potential to Achieve Goals: Good  Plan Discharge plan remains appropriate    Co-evaluation                 AM-PAC PT "6 Clicks" Daily Activity     Outcome Measure   Help from another person eating meals?: None Help from another person taking care of personal grooming?: A Little Help from another person toileting, which includes using toliet, bedpan, or urinal?: A Lot Help from another person bathing (including washing, rinsing, drying)?: A Lot Help from another person to put on and taking off regular upper body clothing?: A Little Help from another person to put on and taking off regular lower body clothing?: A Lot 6  Click Score: 16    End of Session Equipment Utilized During Treatment: Gait belt;Rolling walker (Darco Shoe)  OT Visit Diagnosis: Unsteadiness on feet (R26.81);Pain Pain - Right/Left: Left Pain - part of body: Leg   Activity Tolerance Patient tolerated treatment well   Patient Left in chair;with call bell/phone within reach;with family/visitor present   Nurse Communication Mobility status        Time: 1438-1500 OT Time Calculation (min): 22 min  Charges: OT General Charges $OT Visit: 1 Visit OT Treatments $Therapeutic Activity: 8-22 mins  Joshua Sectionharity A Habeeb Puertas, MS OTR/L  Pager: [phone]    [name] 12/19/2016, 5:15 PM

## 2016-12-19 NOTE — Progress Notes (Signed)
Results for [name] KitchenCOMPTON, Zyren D (MRN [id number]) as of 12/19/2016 09:07  Ref. Range 12/18/2016 16:21 12/18/2016 22:40 12/19/2016 06:19 12/19/2016 07:55 12/19/2016 08:49  Glucose-Capillary Latest Ref Range: 65 - 99 mg/dL 409287 (H) 811338 (H) 914479 (H) 439 (H) 454 (H)  Paged by staff RN regarding elevated blood sugars.  Recommend changing insulin to Lantus 50 units daily (give now) and changing Novolog correction scale to RESISTANT TID & HS.  Continue Novolog 20 units TID with meals if eating at least 50% of meal.  Discontinue NPH while in the hospital.   Smith MinceKendra Loukisha Gunnerson RN BSN CDE Diabetes Coordinator Pager: [phone]  8am-5pm

## 2016-12-19 NOTE — Progress Notes (Addendum)
Progress Note    12/19/2016 7:12 AM 4 Days Post-Op  Subjective:  Feels weak but better  Tm 99.4 100's-130's systolic HR 50's-80's 97% CPAP  Vitals:   12/18/16 2333 12/19/16 0400  BP: (!) 119/53 (!) 135/54  Pulse: 66 71  Resp: 14 18  Temp: 99.2 F (37.3 C) 99.4 F (37.4 C)  SpO2: 99% 98%    Physical Exam: Cardiac:  regular Lungs:  Non labored Incisions:  Wound vac with good seal on toe site; right groin is clean and dry with some mild erythema present. Extremities:  Brisk doppler signals left DP > right DP Abdomen:  Soft, NT/ND; +flatus; -BM  CBC    Component Value Date/Time   WBC 10.7 (H) 12/19/2016 0310   RBC 3.08 (L) 12/19/2016 0310   HGB 9.6 (L) 12/19/2016 0310   HCT 28.5 (L) 12/19/2016 0310   PLT 239 12/19/2016 0310   MCV 92.5 12/19/2016 0310   MCH 31.2 12/19/2016 0310   MCHC 33.7 12/19/2016 0310   RDW 15.2 12/19/2016 0310   LYMPHSABS 1.5 11/26/2016 0550   MONOABS 0.9 11/26/2016 0550   EOSABS 0.2 11/26/2016 0550   BASOSABS 0.0 11/26/2016 0550    BMET    Component Value Date/Time   NA 132 (L) 12/18/2016 0403   K 3.1 (L) 12/18/2016 0403   CL 94 (L) 12/18/2016 0403   CO2 27 12/18/2016 0403   GLUCOSE 276 (H) 12/18/2016 0403   BUN 37 (H) 12/18/2016 0403   CREATININE 6.36 (H) 12/18/2016 0403   CALCIUM 7.9 (L) 12/18/2016 0403   GFRNONAA 8 (L) 12/18/2016 0403   GFRAA 9 (L) 12/18/2016 0403    INR    Component Value Date/Time   INR 1.22 09/21/2015 1100     Intake/Output Summary (Last 24 hours) at 12/19/16 16100712 Last data filed at 12/19/16 0606  Gross per 24 hour  Intake            16449 ml  Output            16845 ml  Net             -396 ml   Specimen Description PERITONEAL   Special Requests NONE   Gram Stain WBC PRESENT,BOTH PMN AND MONONUCLEAR  NO ORGANISMS SEEN  CYTOSPIN SMEAR      Culture NO GROWTH < 24 HOURS   Report Status PENDING    Specimen Description BLOOD RIGHT HAND   Special Requests BOTTLES DRAWN AEROBIC AND ANAEROBIC  Blood Culture adequate volume   Culture NO GROWTH 1 DAY   Report Status PENDING      Assessment:  76 y.o. male is s/p:  Open repair of right common femoral pseudoaneurysm  4 Days Post-Op  Plan: -leukocytosis and fever improving-no growth present in the peritoneal fluid or blood cx.  There is mild erythema around right groin incision-continue to monitor -continue IV abx -he is npo for CT scan today per pt (U/S of GB orderred) -PD per renal -DVT prophylaxis:  SQ heparin   Doreatha MassedSamantha Rhyne, PA-C [facility] [phone] 12/19/2016 7:12 AM  I agree with the above.  I have seen and evaluated the patient.  I changed his wound VAC this morning.  There is necrotic tissue with drainage around the toe amputation site.  This clearly is not viable.  The patient is going to need a more proximal amputation.  I am concerned about the proximal extent of the amputation, meaning that he may require a below-knee amputation.  However, he does have a palpable dorsalis pedis pulse, so I will try to do everything possible to salvage the foot.  This will probably need to be mid to late week.  In the meantime, he will continue with peritoneal dialysis.  The patient has been hyperglycemic with blood sugars over 400.  I will last the diabetes coordinator to assist [place] with management of his blood sugar.  Durene Cal

## 2016-12-20 LAB — CBC
HCT: 29.4 % — ABNORMAL LOW (ref 39.0–52.0)
Hemoglobin: 9.6 g/dL — ABNORMAL LOW (ref 13.0–17.0)
MCH: 30.3 pg (ref 26.0–34.0)
MCHC: 32.7 g/dL (ref 30.0–36.0)
MCV: 92.7 fL (ref 78.0–100.0)
PLATELETS: 272 10*3/uL (ref 150–400)
RBC: 3.17 MIL/uL — AB (ref 4.22–5.81)
RDW: 14.6 % (ref 11.5–15.5)
WBC: 9.6 10*3/uL (ref 4.0–10.5)

## 2016-12-20 LAB — GLUCOSE, CAPILLARY
GLUCOSE-CAPILLARY: 395 mg/dL — AB (ref 65–99)
GLUCOSE-CAPILLARY: 387 mg/dL — AB (ref 65–99)
GLUCOSE-CAPILLARY: 306 mg/dL — AB (ref 65–99)
Glucose-Capillary: 279 mg/dL — ABNORMAL HIGH (ref 65–99)
Glucose-Capillary: 125 mg/dL — ABNORMAL HIGH (ref 65–99)

## 2016-12-20 NOTE — Progress Notes (Signed)
  [facility] Progress Note   Assessment/ Plan:   1. 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 ESRD- Will continue with CCPD, use all 2.5% today based on BP.  Will time the pause to be more like his mid-day drain at home. 3. Hypertension/volume- stable 4. Anemia (ABLA)- Hgb up to 9.6 after pRBCs. 5. Nausea, GB US with 6 mm gallbladder polyp. 6. Fever spike to 101.6- on linezolid/ ceftaz.  Cultures negative   Subjective:    Feeling OK this AM.     Objective:   BP (!) 97/54 (BP Location: Right Arm)   Pulse 84   Temp 99.1 F (37.3 C) (Axillary)   Resp (!) 22   Ht 5\' 9"  (1.753 m)   Wt 98.3 kg (216 lb 11.4 oz)   SpO2 97%   BMI 32.00 kg/m   Physical Exam: GEN NAD, sitting in chair HEENT EOMI, PERRL NECK no JVD PULM clear bilaterally CV RRR no m/r/g ABD distended, NABS EXT R LE woundvac, no LLE edema NEURO AAO x 3  Labs: BMET  Recent Labs Lab 12/15/16 1045 12/15/16 1927 12/16/16 0550 12/18/16 0403 12/19/16 0714  NA 135  --  134*  134* 132*  --   K 4.0  --  4.5  4.4 3.1*  --   CL 92*  --  97*  97* 94*  --   CO2  --   --  27  28 27   --   GLUCOSE 276*  --  251*  252* 276* 469*  BUN 38*  --  44*  44* 37*  --   CREATININE 5.80* 5.72* 6.01*  5.96* 6.36*  --   CALCIUM  --   --  8.1*  8.1* 7.9*  --   PHOS  --   --  4.5  --   --    CBC  Recent Labs Lab 12/17/16 0206 12/18/16 0403 12/19/16 0310 12/20/16 0350  WBC 20.6* 17.0* 10.7* 9.6  HGB 7.4* 7.1* 9.6* 9.6*  HCT 22.2* 21.5* 28.5* 29.4*  MCV 96.1 96.4 92.5 92.7  PLT 260 248 239 272    @IMGRELPRIORS @ Medications:    . aspirin EC  81 mg Oral Daily  . atorvastatin  80 mg Oral q1800  . calcium acetate  667 mg Oral TID WC  . carvedilol  12.5 mg Oral BID WC  . clopidogrel  75 mg Oral Daily  . docusate sodium  100 mg Oral BID  . furosemide  40 mg Oral BID  . gentamicin cream  1 application Topical Daily   . heparin  5,000 Units Subcutaneous Q8H  . insulin aspart  0-20 Units Subcutaneous TID WC  . insulin aspart  0-5 Units Subcutaneous QHS  . insulin aspart  20 Units Subcutaneous TID WC  . insulin glargine  50 Units Subcutaneous Daily  . isosorbide mononitrate  30 mg Oral Daily  . losartan  25 mg Oral QHS  . mouth rinse  15 mL Mouth Rinse BID  . metoCLOPramide (REGLAN) injection  10 mg Intravenous Q6H  . pantoprazole  40 mg Oral Daily  . [START ON 01/08/2017] Vitamin D (Ergocalciferol)  50,000 Units Oral Q30 days     Bufford ButtnerElizabeth Jhoan Schmieder MD [facility] pgr [phone] 12/20/2016, 1:46 PM

## 2016-12-20 NOTE — Progress Notes (Signed)
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 PMH significant for CHF, COPD, CAD, CABG 2011, defirillator placement, DM2, ESRD on peritoneal dialysis, HTN and stroke.      PT Comments    Patient tolerated an increase in activity this session and able to ambulate 4512ft with min/mod A. Pt reported being fatigued and feeling nauseated all day. Wife present end of session. Continue to progress as tolerated.     Follow Up Recommendations  CIR     Equipment Recommendations  Other (comment) (to be determined at next venue)    Recommendations for Other Services Rehab consult     Precautions / Restrictions Precautions Precautions: Fall Restrictions Weight Bearing Restrictions: Yes LLE Weight Bearing: Partial weight bearing LLE Partial Weight Bearing Percentage or Pounds:  (Darco shoe)    Mobility  Bed Mobility Overal bed mobility: Needs Assistance Bed Mobility: Supine to Sit;Sit to Supine     Supine to sit: Min assist Sit to supine: Min assist   General bed mobility comments: assist to elevate trunk and bring L LE into bed  Transfers Overall transfer level: Needs assistance Equipment used: Rolling walker (2 wheeled) Transfers: Sit to/from Stand Sit to Stand: Mod assist;+2 safety/equipment         General transfer comment: assist to power up into standing and maintain PWB upon stand; cues for safe hand placement and technique  Ambulation/Gait Ambulation/Gait assistance: +2 safety/equipment;Mod assist Ambulation Distance (Feet): 10 Feet Assistive device: Rolling walker (2 wheeled) Gait Pattern/deviations: Step-to pattern;Decreased stance time - left;Decreased step length - right;Decreased weight shift to left;Trunk flexed Gait velocity: decreased   General  Gait Details: cues for posture and sequencing as well as positioning in RW to assist with offloadine L LE   Stairs            Wheelchair Mobility    Modified Rankin (Stroke Patients Only)       Balance Overall balance assessment: Needs assistance Sitting-balance support: No upper extremity supported;Feet supported Sitting balance-Leahy Scale: Fair     Standing balance support: Bilateral upper extremity supported Standing balance-Leahy Scale: Poor                              Cognition Arousal/Alertness: Awake/alert Behavior During Therapy: WFL for tasks assessed/performed Overall Cognitive Status: Within Functional Limits for tasks assessed                                        Exercises      General Comments General comments (skin integrity, edema, etc.): pt dry heaving end of session and reported feeling nauseated all day and eating very little--RN aware and wife present      Pertinent Vitals/Pain Pain Assessment: 0-10 Pain Score: 8  Pain Location: groin (pt reported groin area but pointed to R side)  Pain Descriptors / Indicators: Sore;Aching Pain Intervention(s): Limited activity within patient's tolerance;Monitored during session;Repositioned    Home Living                      Prior Function            PT Goals (current goals can now  be found in the care plan section) Acute Rehab PT Goals PT Goal Formulation: With patient Time For Goal Achievement: 12/16/16 Potential to Achieve Goals: Good Progress towards PT goals: Progressing toward goals    Frequency    Min 3X/week      PT Plan Current plan remains appropriate    Co-evaluation              AM-PAC PT "6 Clicks" Daily Activity  Outcome Measure  Difficulty turning over in bed (including adjusting bedclothes, sheets and blankets)?: A Lot Difficulty moving from lying on back to sitting on the side of the bed? : Unable Difficulty sitting down  on and standing up from a chair with arms (e.g., wheelchair, bedside commode, etc,.)?: Unable Help needed moving to and from a bed to chair (including a wheelchair)?: A Little Help needed walking in hospital room?: A Lot Help needed climbing 3-5 steps with a railing? : A Lot 6 Click Score: 11    End of Session Equipment Utilized During Treatment: Gait belt Activity Tolerance: Patient limited by fatigue Patient left: with call bell/phone within reach;in bed;with family/visitor present Nurse Communication: Mobility status PT Visit Diagnosis: Unsteadiness on feet (R26.81);Other abnormalities of gait and mobility (R26.89);Muscle weakness (generalized) (M62.81);Difficulty in walking, not elsewhere classified (R26.2);Pain Pain - Right/Left: Left Pain - part of body: Ankle and joints of foot     Time: 1610-9604 PT Time Calculation (min) (ACUTE ONLY): 25 min  Charges:  $Gait Training: 8-22 mins $Therapeutic Activity: 8-22 mins                    G Codes:       Erline Levine, PTA Pager: [phone]     Carolynne Edouard 12/20/2016, 4:43 PM

## 2016-12-20 NOTE — Progress Notes (Addendum)
  Progress Note    12/20/2016 8:00 AM 5 Days Post-Op  Subjective:  Feels pretty good this morning  Afebrile HR  60's-80's 90's-150's systolic 98% CPAP  Vitals:   12/20/16 0400 12/20/16 0500  BP: (!) 131/49   Pulse: 73 68  Resp: 19 17  Temp: 97.8 F (36.6 C)   SpO2: 97% 98%    Physical Exam: Cardiac:  Regular Lungs:  Non labored Incisions:  Right groin clean and dry Extremities:  Left foot with wound vac in place and palpable left DP pulse   CBC    Component Value Date/Time   WBC 9.6 12/20/2016 0350   RBC 3.17 (L) 12/20/2016 0350   HGB 9.6 (L) 12/20/2016 0350   HCT 29.4 (L) 12/20/2016 0350   PLT 272 12/20/2016 0350   MCV 92.7 12/20/2016 0350   MCH 30.3 12/20/2016 0350   MCHC 32.7 12/20/2016 0350   RDW 14.6 12/20/2016 0350   LYMPHSABS 1.5 11/26/2016 0550   MONOABS 0.9 11/26/2016 0550   EOSABS 0.2 11/26/2016 0550   BASOSABS 0.0 11/26/2016 0550    BMET    Component Value Date/Time   NA 132 (L) 12/18/2016 0403   K 3.1 (L) 12/18/2016 0403   CL 94 (L) 12/18/2016 0403   CO2 27 12/18/2016 0403   GLUCOSE 469 (H) 12/19/2016 0714   BUN 37 (H) 12/18/2016 0403   CREATININE 6.36 (H) 12/18/2016 0403   CALCIUM 7.9 (L) 12/18/2016 0403   GFRNONAA 8 (L) 12/18/2016 0403   GFRAA 9 (L) 12/18/2016 0403    INR    Component Value Date/Time   INR 1.22 09/21/2015 1100     Intake/Output Summary (Last 24 hours) at 12/20/16 0800 Last data filed at 12/19/16 1710  Gross per 24 hour  Intake            14745 ml  Output            16247 ml  Net            -1502 ml     Assessment:  76 y.o. male is s/p:  Open repair of right common femoral pseudoaneurysm   5 Days Post-Op  Plan: -pt doing well this morning -vac was removed yesterday and wound inspected.  Dr. Myra GianottiBrabham planning on further amputation later this week.  He does have a palpable left DP pulse.   -leukocytosis continues to improve & he is afebrile.  Fluid and blood cx no growth x 2 days. -DVT prophylaxis:  SQ  heparin -US revealed 6mm GB polyp but no evidence of acute cholecystitis.    Doreatha MassedSamantha Rhyne, PA-C [facility] [phone] 12/20/2016 8:00 AM   I agree with the above.  I have seen and examined the patient.  Abx adjusted based on cultures.  Nausea this afternoon.  I spoke with [name] who will evaluate the patient for amputation.  Durene CalWells Callahan Wild

## 2016-12-20 NOTE — Progress Notes (Signed)
Inpatient Diabetes Program Recommendations  AACE/ADA: New Consensus Statement on Inpatient Glycemic Control (2015)  Target Ranges:  Prepandial:   less than 140 mg/dL      Peak postprandial:   less than 180 mg/dL (1-2 hours)      Critically ill patients:  140 - 180 mg/dL   Results for [name] KitchenCOMPTON, Wilford D (MRN [id number]) as of 12/20/2016 14:32  Ref. Range 12/19/2016 08:49 12/19/2016 12:05 12/19/2016 16:16 12/19/2016 21:24 12/20/2016 06:31 12/20/2016 08:46 12/20/2016 11:17  Glucose-Capillary Latest Ref Range: 65 - 99 mg/dL 409454 (H) 811373 (H) 914220 (H) 170 (H) 395 (H) 387 (H) 279 (H)   Review of Glycemic Control  Diabetes history: DM 2 Outpatient Diabetes medications: NPH 70 units QHS, Novolin R 20 units tid with meals Current orders for Inpatient glycemic control: Lantus 50 units, Novolog Resistant 0-20 units tid + Novolog HS scale 0-5 units, Novolog 20 units tid meal coverage  Inpatient Diabetes Program Recommendations:     Fasting glucose elevated in the 300 range still on Lantus 50 units. Please consider increasing Lantus to 60 units.  Thanks,  Christena DeemShannon Manaia Samad RN, MSN, [facility] Inpatient Diabetes Coordinator Team Pager [phone] (8a-5p)

## 2016-12-21 ENCOUNTER — Encounter: Payer: Medicare Other | Admitting: Vascular Surgery

## 2016-12-21 ENCOUNTER — Encounter (HOSPITAL_COMMUNITY): Payer: Medicare Other

## 2016-12-21 LAB — GLUCOSE, CAPILLARY
GLUCOSE-CAPILLARY: 357 mg/dL — AB (ref 65–99)
GLUCOSE-CAPILLARY: 143 mg/dL — AB (ref 65–99)
Glucose-Capillary: 401 mg/dL — ABNORMAL HIGH (ref 65–99)
Glucose-Capillary: 459 mg/dL — ABNORMAL HIGH (ref 65–99)
Glucose-Capillary: 97 mg/dL (ref 65–99)

## 2016-12-21 LAB — BASIC METABOLIC PANEL
ANION GAP: 11 (ref 5–15)
BUN: 26 mg/dL — ABNORMAL HIGH (ref 6–20)
CO2: 26 mmol/L (ref 22–32)
CREATININE: 6.46 mg/dL — AB (ref 0.61–1.24)
Calcium: 8.2 mg/dL — ABNORMAL LOW (ref 8.9–10.3)
Chloride: 93 mmol/L — ABNORMAL LOW (ref 101–111)
GFR calc Af Amer: 9 mL/min — ABNORMAL LOW (ref >60)
GFR calc non Af Amer: 7 mL/min — ABNORMAL LOW (ref >60)
Glucose, Bld: 465 mg/dL — ABNORMAL HIGH (ref 65–99)
POTASSIUM: 3 mmol/L — AB (ref 3.5–5.1)
Sodium: 130 mmol/L — ABNORMAL LOW (ref 135–145)

## 2016-12-21 MED ORDER — INSULIN GLARGINE 100 UNIT/ML ~~LOC~~ SOLN
60.0000 [IU] | Freq: Every day | SUBCUTANEOUS | Status: DC
Start: 2016-12-21 — End: 2016-12-22
  Administered 2016-12-21: 60 [IU] via SUBCUTANEOUS
  Filled 2016-12-21 (×2): qty 0.6

## 2016-12-21 MED ORDER — LACTULOSE 10 GM/15ML PO SOLN
30.0000 g | Freq: Two times a day (BID) | ORAL | Status: DC
  Administered 2016-12-21 (×2): 30 g via ORAL
  Filled 2016-12-21 (×3): qty 45

## 2016-12-21 MED ORDER — POTASSIUM CHLORIDE CRYS ER 20 MEQ PO TBCR
20.0000 meq | EXTENDED_RELEASE_TABLET | Freq: Two times a day (BID) | ORAL | Status: DC
  Administered 2016-12-21 – 2016-12-27 (×13): 20 meq via ORAL
  Filled 2016-12-21 (×13): qty 1

## 2016-12-21 NOTE — Progress Notes (Signed)
Spoke with staff RN about patient's elevated blood sugars. Recommend getting a BMET now with a blood sugar of 459 mg/dl. Will continue to monitor blood sugars and will determine any recommendations for changes to insulin regimen.    Smith MinceKendra Eoin Willden RN BSN CDE Diabetes Coordinator Pager: [phone]  8am-5pm

## 2016-12-21 NOTE — Progress Notes (Signed)
Pt's glucose 459. PA notified. Received orders to give 20 units novolog sliding scale coverage, 60 units lantus, and notify diabetes coordinator. Diabetes coordinator notified for further orders regarding pt's glucose levels. Will await further orders and continue current plan of care with pt.   Berdine DanceLauren Moffitt BSN, RN

## 2016-12-21 NOTE — Progress Notes (Signed)
Physical Therapy Treatment Patient Details Name: [name] MRN: [id number] DOB: [birth year] Today's Date: 12/21/2016    History of Present Illness Pt is a 76 yo male s/p L third toe amputation 11/29/16 and subsequent dehiscence, revision surgery on 12/11/16. PMH significant for CHF, COPD, CAD, CABG 2011, defirillator placement, DM2, ESRD on peritoneal dialysis, HTN and stroke.      PT Comments    Patient is making progressing with mobility and able to tolerate increased activity. Pt reported feeling better today and with no nausea/emesis. Pt required min A +2 for safety functional transfers and ambulation. Continue to progress as tolerated. Scheduled for OR on 12/23/16.    Follow Up Recommendations  CIR     Equipment Recommendations  Other (comment) (to be determined at next venue)    Recommendations for Other Services Rehab consult     Precautions / Restrictions Precautions Precautions: Fall Restrictions Weight Bearing Restrictions: Yes LLE Weight Bearing: Partial weight bearing LLE Partial Weight Bearing Percentage or Pounds:  (Darco shoe)    Mobility  Bed Mobility Overal bed mobility: Needs Assistance Bed Mobility: Supine to Sit     Supine to sit: Min assist     General bed mobility comments: assist to elevate trunk into sitting due to painful back; cues for sequencing  Transfers Overall transfer level: Needs assistance Equipment used: Rolling walker (2 wheeled) Transfers: Sit to/from Stand Sit to Stand: +2 safety/equipment;Min assist         General transfer comment: assist to power up into standing and cues for safe hand placement and positioning to maintian PWB L LE  Ambulation/Gait Ambulation/Gait assistance: +2 safety/equipment;Min assist Ambulation Distance (Feet): 40 Feet Assistive device: Rolling walker (2 wheeled) Gait Pattern/deviations: Step-to pattern;Decreased stance time - left;Decreased step length - right;Decreased weight shift to  left;Trunk flexed Gait velocity: decreased   General Gait Details: cues for posture and proximity of RW to aid in limiting weight bearing on L LE; pt with less difficulty maintaining weight mostly on L heel with tennis shoe donned on R   Stairs            Wheelchair Mobility    Modified Rankin (Stroke Patients Only)       Balance Overall balance assessment: Needs assistance Sitting-balance support: No upper extremity supported;Feet supported Sitting balance-Leahy Scale: Good     Standing balance support: Bilateral upper extremity supported Standing balance-Leahy Scale: Poor                              Cognition Arousal/Alertness: Awake/alert Behavior During Therapy: WFL for tasks assessed/performed Overall Cognitive Status: Within Functional Limits for tasks assessed                                        Exercises General Exercises - Upper Extremity Shoulder ABduction: AROM;Strengthening;Both;10 reps;Supine;Seated;Theraband Theraband Level (Shoulder Abduction): Level 1 (Yellow) Shoulder ADduction: AROM;Strengthening;Both;10 reps;Supine;Seated;Theraband Theraband Level (Shoulder Adduction): Level 1 (Yellow) Shoulder Horizontal ABduction: AROM;Strengthening;Both;10 reps;Seated Shoulder Horizontal ADduction: AROM;Right;Both;10 reps;Seated;Theraband Theraband Level (Shoulder Horizontal Adduction): Level 1 (Yellow) Chair Push Up: AROM;Strengthening;Both;10 reps;Seated    General Comments        Pertinent Vitals/Pain Pain Assessment: Faces Faces Pain Scale: Hurts little more Pain Location: R groin area and back Pain Descriptors / Indicators: Sore;Aching Pain Intervention(s): Limited activity within patient's tolerance;Monitored during session;Repositioned    Home Living  Prior Function            PT Goals (current goals can now be found in the care plan section) Acute Rehab PT Goals Patient Stated  Goal: to rehab then home PT Goal Formulation: With patient Time For Goal Achievement: 12/16/16 Potential to Achieve Goals: Good Progress towards PT goals: Progressing toward goals    Frequency    Min 3X/week      PT Plan Current plan remains appropriate    Co-evaluation              AM-PAC PT "6 Clicks" Daily Activity  Outcome Measure  Difficulty turning over in bed (including adjusting bedclothes, sheets and blankets)?: A Lot Difficulty moving from lying on back to sitting on the side of the bed? : Unable Difficulty sitting down on and standing up from a chair with arms (e.g., wheelchair, bedside commode, etc,.)?: Unable Help needed moving to and from a bed to chair (including a wheelchair)?: A Little Help needed walking in hospital room?: A Little Help needed climbing 3-5 steps with a railing? : A Lot 6 Click Score: 12    End of Session Equipment Utilized During Treatment: Gait belt Activity Tolerance: Patient tolerated treatment well Patient left: with call bell/phone within reach;in chair Nurse Communication: Mobility status PT Visit Diagnosis: Unsteadiness on feet (R26.81);Other abnormalities of gait and mobility (R26.89);Muscle weakness (generalized) (M62.81);Difficulty in walking, not elsewhere classified (R26.2);Pain Pain - Right/Left: Left Pain - part of body: Ankle and joints of foot     Time: [id number] PT Time Calculation (min) (ACUTE ONLY): 28 min  Charges:  $Gait Training: 8-22 mins $Therapeutic Activity: 8-22 mins                    G Codes:       Erline LevineKellyn Venissa Nappi, PTA Pager: [phone]     Carolynne EdouardKellyn R Durant Scibilia 12/21/2016, 3:38 PM

## 2016-12-21 NOTE — Progress Notes (Signed)
Went to pt's room to hook up for his PD tx and found on pre assess that his sbp was 88, retook and sbp 87, pt looked pale, not like the last time I had him, and face was flush. I called primary nurse to bedside and explained that pt doesn't look like he did last time I had him, explained he was pale, flush, bp lower than normal and he was throwing pvc's more frequently. Primary nurse took manual bp and it was 82/58. I paged Dr. Allena KatzPatel and made him aware of the situation and was directed not to hook pt up to PD tx tonight. Made primary nurse aware that pt would not be having his PD tx tonight

## 2016-12-21 NOTE — Consult Note (Signed)
ORTHOPAEDIC CONSULTATION  REQUESTING PHYSICIAN: [name], Joshua Christophe*  Chief Complaint: open wound left foot status post revascularization.  HPI: [name] is a 76 y.o. male who presents with patient is a 76 year old gentlemandiabetic end-stage renal disease on peritoneal dialysis who is status post revascularization to left lower extremity. He is status post third ray amputation has a wouJuventino Slovaknd VAC in place.  Past Medical History:  Diagnosis Date  . AICD (automatic cardioverter/defibrillator) present   . CHF (congestive heart failure) (HCC)   . COPD (chronic obstructive pulmonary disease) (HCC)   . Coronary artery disease    CABG 2011  . Diabetes mellitus without complication (HCC)   . ESRD on peritoneal dialysis [facility](HCC)    [place].   Joshua Kitchen. History of peritonitis    PD cath related peritonitis in April 2017  . Hypertension   . Peripheral vascular disease (HCC)   . Sjogren's syndrome (HCC)    Per pt's wife, was diagnosed in 2016 during w/u for cause of renal failure prior to starting dialysis.  He had a rash on his chest apparently prompting this w/u.  Never had renal bx.  He [place] to [place] rheum [place] in [place] and per the wife he was treated with MTX and other medications but had side effects to "all of it" and isn't taking anything for this as of Jun 2017.   . Stroke [facility](HCC)    Past Surgical History:  Procedure Laterality Date  . ABDOMINAL AORTOGRAM W/LOWER EXTREMITY N/A 11/29/2016   Procedure: ABDOMINAL AORTOGRAM W/LOWER EXTREMITY;  Surgeon: Nada LibmanBrabham, Vance W, MD;  [facility]: [facility] INV[facility]IVE CV LAB;  Service: Cardiovascular;  Laterality: N/A;  . AMPUTATION Left 11/29/2016   Procedure: LEFT THIRD TOE AMPUTATION;  Surgeon: Nada LibmanBrabham, Vance W, MD;  [facility];  Service: Vascular;  Laterality: Left;  . ANGIOPLASTY  12/15/2016   Procedure: Peyton BottomsBALLOON ANGIOPLASTY;  Surgeon: Maeola Harmanain, Joshua Christopher, MD;  [facility];  Service:  Vascular;;  . cardiac catherization with stent placement  2017  . CORONARY ARTERY BYPASS GRAFT  2011  . FALSE ANEURYSM REPAIR Right 12/15/2016   Procedure: REPAIR FALSE ANEURYSM;  Surgeon: Maeola Harmanain, Joshua Christopher, MD;  [facility];  Service: Vascular;  Laterality: Right;  . IRRIGATION AND DEBRIDEMENT FOOT Left 12/15/2016   Procedure: IRRIGATION AND DEBRIDEMENT FOOT;  Surgeon: Maeola Harmanain, Joshua Christopher, MD;  [facility];  Service: Vascular;  Laterality: Left;  . LOWER EXTREMITY ANGIOGRAM Left 12/15/2016   Procedure: LOWER EXTREMITY ANGIOGRAM; PEDAL ACCESS;  Surgeon: Maeola Harmanain, Joshua Christopher, MD;  [facility];  Service: Vascular;  Laterality: Left;  . PERIPHERAL VASCULAR BALLOON ANGIOPLASTY  11/29/2016   Procedure: PERIPHERAL VASCULAR BALLOON ANGIOPLASTY;  Surgeon: Nada LibmanBrabham, Vance W, MD;  [facility] IN[facility] LAB;  Service: Cardiovascular;;  LT Peroneal AT attempted unsuccessful   Social History   Social History  . Marital status: Married    Spouse name: N/A  . Number of children: N/A  . Years of education: N/A   Social History Main Topics  . Smoking status: Former Games developermoker  . Smokeless tobacco: Never Used  . Alcohol use No  . Drug use: No  . Sexual activity: No   Other Topics Concern  . None   Social History Narrative  . None   Family History  Problem Relation Age of Onset  . Heart disease Father    - negative except otherwise stated in the family history section Allergies  Allergen Reactions  . Lisinopril Hives  . Methotrexate Derivatives Other (See Comments)    blisters  . Sulfa Antibiotics Hives  . Tape Hives  . Vancomycin Other (See Comments)    Blisters    Prior to Admission medications   Medication Sig Start Date End Date Taking? Authorizing Provider  amoxicillin (AMOXIL) 500 MG capsule Take 2,000 mg by mouth See admin instructions. 1 hour prior to dental procedures   Yes [provider]  aspirin EC 81 MG tablet Take 81 mg by mouth daily.    Yes [provider]  atorvastatin (LIPITOR) 80 MG tablet Take 80 mg by mouth daily at 6 PM.   Yes [provider]  calcium acetate (PHOSLO) 667 MG capsule Take 667 mg by mouth 3 (three) times daily with meals.   Yes [provider]  carvedilol (COREG) 12.5 MG tablet Take 12.5 mg by mouth 2 (two) times daily with a meal.   Yes [provider]  clopidogrel (PLAVIX) 75 MG tablet Take 75 mg by mouth daily.   Yes [provider]  docusate sodium (COLACE) 100 MG capsule Take 100 mg by mouth 2 (two) times daily.    Yes [provider]  furosemide (LASIX) 40 MG tablet Take 40 mg by mouth 2 (two) times daily.   Yes [provider]  insulin NPH Human (HUMULIN N,NOVOLIN N) 100 UNIT/ML injection Inject 70 Units into the skin at bedtime.   Yes [provider]  insulin regular (NOVOLIN R,HUMULIN R) 100 units/mL injection Inject 20 Units into the skin 3 (three) times daily before meals.   Yes [provider]  isosorbide mononitrate (IMDUR) 30 MG 24 hr tablet Take 30 mg by mouth daily.   Yes [provider]  losartan (COZAAR) 25 MG tablet Take 25 mg by mouth at bedtime.    Yes [provider]  nitroGLYCERIN (NITROSTAT) 0.4 MG SL tablet Place 0.4 mg under the tongue every 5 (five) minutes as needed for chest pain.   Yes [provider]  ondansetron (ZOFRAN) 4 MG tablet Take 4 mg by mouth every 6 (six) hours as needed for nausea or vomiting.   Yes [provider]  oxyCODONE-acetaminophen (PERCOCET/ROXICET) 5-325 MG tablet Take 1 tablet by mouth every 6 (six) hours as needed for severe pain. 11/30/16  Yes Raymond Gurney, PA-C  Vitamin D, Ergocalciferol, (DRISDOL) 50000 units CAPS capsule Take 50,000 Units by mouth every 30 (thirty) days.    Yes [provider]   US Abdomen Limited  Result Date: 12/19/2016 CLINICAL DATA:  3-4 days of nausea with fever.  History of diabetes. EXAM: ULTRASOUND  ABDOMEN LIMITED RIGHT UPPER QUADRANT COMPARISON:  None in PACs FINDINGS: Gallbladder: The gallbladder is adequately distended. There is an echogenic non mobile nonshadowing focus near the gallbladder neck compatible with a polyp measuring up 6 mm in diameter. There is no gallbladder wall thickening, pericholecystic fluid, or positive sonographic Murphy's sign. Common bile duct: Diameter: 4.5 mm. Liver: No focal lesion identified. Within normal limits in parenchymal echogenicity. Portal vein is patent on color Doppler imaging with normal direction of blood flow towards the liver. IMPRESSION: Probable 6 mm gallbladder polyp near the neck. No sonographic evidence of acute cholecystitis. Electronically Signed   By: David  [place] M.D.   On: 12/19/2016 12:27   - pertinent xrays, CT, MRI studies were reviewed and independently interpreted  Positive ROS: All other systems have been reviewed and were otherwise negative with the exception of those  mentioned in the HPI and as above.  Physical Exam: General: Alert, no acute distress Psychiatric: Patient is competent for consent with normal mood and affect Lymphatic: No axillary or cervical lymphadenopathy Cardiovascular: No pedal edema Respiratory: No cyanosis, no use of accessory musculature GI: No organomegaly, abdomen is soft and non-tender  Skin: patient has ischemic changes to the fourth toe open wound with a functioning wound VAC over the third ray amputation. Asian has a strong dorsalis pedis pulse   Neurologic: Patient does not have protective sensation bilateral lower extremities.   MUSCULOSKELETAL:  There is no ascending cellulitis. He has a strong dorsalis pedis pulse. Wound VAC is functioning well. Status post third ray amputation.  Assessment: Assessment: Peripheral vascular disease status post 3 vessel physician the left lower extremity status post third ray amputation with gangrenous fourth toe with diabetes end stage renal disease on  peritoneal dialysis.  Plan: Plan: I feel the patient best option would be to proceed with a transmetatarsal amputation. Risks and benefits were discussed including risk of the wound not healing. Discussed that I can proceed on Friday with surgery. Patient states he understands and would like to proceed with surgery.  Thank you for the consult and the opportunity to see [name]  Aldean Baker, MD [facility] [phone] 7:18 AM

## 2016-12-21 NOTE — Consult Note (Addendum)
WOC follow-up: Pt plans to return to the OR on Friday for left foot surgery with Dr Lajoyce Cornersuda of the ortho service.  Called Vascular team to determine if they would like moist gauze dressing instead of Vac since pt has nonviable tissue to the inner wound bed and Vac therapy is not effective in these circumstances.  Vascular team PA will call  Dr Myra GianottiBrabham to ask what the plan of care should be and stated a moist gauze dressing can be applied until further input is available. Inner wound with dusky red patchy areas of slough/eschar. Plantar foot with red moist macerated skin and partial thickness skin loss. 4th toe next to the wound with grey macerated loose peeling skin to tip of toe and toenail and do not appear viable.   Post note 13:00: Received a call from the VVS PA; Vac orders were discontinued, informed pt and family member. Topical treatment orders were provided for bedside nurse to change moist gauze dressings Q day. Please re-consult if further assistance is needed.  Thank-you,  Cammie Mcgeeawn Raliyah Montella MSN, RN, CWOCN, [place]AP, CNS [phone]

## 2016-12-21 NOTE — Progress Notes (Signed)
Occupational Therapy Treatment Patient Details Name: [name] MRN: [id number] DOB: 01-Apr-1941 Today's Date: 12/21/2016    History of present illness Pt is a 76 yo male s/p L third toe amputation 11/29/16 and subsequent dehiscence, revision surgery on 12/11/16. PMH significant for CHF, COPD, CAD, CABG 2011, defirillator placement, DM2, ESRD on peritoneal dialysis, HTN and stroke.     OT comments  Pt presented sitting OOB in recliner and willing to participate in therapy. Session focused on establishing HEP for BUE in preparation for increased dependence post-op to assist with functional transfers and ADL independence. Handout provided and reviewed/demonstrated in full. Pt fatigues quickly and requires rest breaks during reps (usually able to complete 5, then rest, then complete additional 5). OT will continue to follow for maximum independence in ADL and functional transfers. CIR continues to be best option to return to PLOF.   Follow Up Recommendations  CIR;Supervision/Assistance - 24 hour    Equipment Recommendations  Other (comment) (defer to next venue)    Recommendations for Other Services      Precautions / Restrictions Precautions Precautions: Fall Restrictions Weight Bearing Restrictions: Yes LLE Weight Bearing: Partial weight bearing LLE Partial Weight Bearing Percentage or Pounds:  (Darco shoe)       Mobility Bed Mobility Overal bed mobility: Needs Assistance Bed Mobility: Supine to Sit     Supine to sit: Min assist     General bed mobility comments: Pt OOB in recliner when OT arrived  Transfers Overall transfer level: Needs assistance Equipment used: Rolling walker (2 wheeled) Transfers: Sit to/from Stand Sit to Stand: +2 safety/equipment;Min assist         General transfer comment: not attempted this session    Balance Overall balance assessment: Needs assistance Sitting-balance support: No upper extremity supported;Feet supported Sitting  balance-Leahy Scale: Good Sitting balance - Comments: able to perform exercises with resistance bands in sitting   Standing balance support: Bilateral upper extremity supported Standing balance-Leahy Scale: Poor                             ADL either performed or assessed with clinical judgement   ADL                                         General ADL Comments: session focused on HEP for BUE in preparation for increased dependence on UE for support during mobility     Vision       Perception     Praxis      Cognition Arousal/Alertness: Awake/alert Behavior During Therapy: WFL for tasks assessed/performed Overall Cognitive Status: Within Functional Limits for tasks assessed                                          Exercises Exercises: General Upper Extremity General Exercises - Upper Extremity Shoulder ABduction: AROM;Strengthening;Both;10 reps;Supine;Seated;Theraband Theraband Level (Shoulder Abduction): Level 1 (Yellow) Shoulder ADduction: AROM;Strengthening;Both;10 reps;Supine;Seated;Theraband Theraband Level (Shoulder Adduction): Level 1 (Yellow) Shoulder Horizontal ABduction: AROM;Strengthening;Both;10 reps;Seated Shoulder Horizontal ADduction: AROM;Right;Both;10 reps;Seated;Theraband Theraband Level (Shoulder Horizontal Adduction): Level 1 (Yellow) Chair Push Up: AROM;Strengthening;Both;10 reps;Seated   Shoulder Instructions       General Comments wife present during session    Pertinent Vitals/ Pain  Pain Assessment: Faces Faces Pain Scale: Hurts little more Pain Location: R groin area and back Pain Descriptors / Indicators: Sore;Aching Pain Intervention(s): Limited activity within patient's tolerance  Home Living                                          Prior Functioning/Environment              Frequency  Min 2X/week        Progress Toward Goals  OT Goals(current goals  can now be found in the care plan section)  Progress towards OT goals: Progressing toward goals  Acute Rehab OT Goals Patient Stated Goal: to rehab then home OT Goal Formulation: With patient/family Time For Goal Achievement: 12/23/16 Potential to Achieve Goals: Good ADL Goals Pt/caregiver will Perform Home Exercise Program: Increased strength;Both right and left upper extremity;With theraband;With written HEP provided  Plan Discharge plan remains appropriate    Co-evaluation                 AM-PAC PT "6 Clicks" Daily Activity     Outcome Measure   Help from another person eating meals?: None Help from another person taking care of personal grooming?: A Little Help from another person toileting, which includes using toliet, bedpan, or urinal?: A Lot Help from another person bathing (including washing, rinsing, drying)?: A Lot Help from another person to put on and taking off regular upper body clothing?: A Little Help from another person to put on and taking off regular lower body clothing?: A Lot 6 Click Score: 16    End of Session Equipment Utilized During Treatment: Other (comment) (theraband, darco shoe on)  OT Visit Diagnosis: Unsteadiness on feet (R26.81);Pain Pain - Right/Left: Left Pain - part of body: Leg   Activity Tolerance Patient tolerated treatment well   Patient Left in chair;with call bell/phone within reach;with family/visitor present   Nurse Communication Mobility status        Time: [id number] OT Time Calculation (min): 18 min  Charges: OT General Charges $OT Visit: 1 Visit OT Treatments $Therapeutic Exercise: 8-22 mins  Sherryl MangesLaura Vivek Grealish OTR/L [phone]  Evern BioLaura J Abdulaziz Toman 12/21/2016, 4:13 PM

## 2016-12-21 NOTE — Progress Notes (Signed)
[facility] Progress Note   Assessment/ Plan:   1. PAD- s/p open repair of R common femoral artery pseudoaneurysm- per vascular, needs more proximal amputation. Ortho consulted- plan for L TMT on Friday. 2. ESRD- Will continue with CCPD, use all 2.5% today based on BP.  Pause has been timed to be more like his mid-day drain at home.  3. Hypertension/volume- stable 4. Anemia (ABLA)- Hgb up to 9.6 after pRBCs. 5. Nausea, GB US with 6 mm gallbladder polyp. 6. Fever/ possible sepsis- on linezolid/ ceftaz.  Cultures negative (including PD culture) 7. Constipation: will add aggressive bowel regimen 8. Nutrition: liberalize diet to carb modified only 9. Hypokalemia: supplementing  Subjective:    Reports constipation-  Hasn't had BM in 5-6 days.      Objective:   BP (!) 88/42 (BP Location: Right Arm)   Pulse 80   Temp 98.2 F (36.8 C) (Axillary)   Resp (!) 22   Ht  (1.753 m)   Wt 95.7 kg (210 lb 15.7 oz)   SpO2 94%   BMI 31.16 kg/m   Physical Exam: GEN NAD, sitting in chair HEENT EOMI, PERRL NECK no JVD PULM clear bilaterally CV RRR no m/r/g ABD distended, NABS EXT L LE with dry gauze covering the wound, trace LLE edema, no RLE edema NEURO AAO x 3  Labs: BMET  Recent Labs Lab 12/15/16 1045 12/15/16 1927 12/16/16 0550 12/18/16 0403 12/19/16 0714 12/21/16 0905  NA 135  --  134*  134* 132*  --  130*  K 4.0  --  4.5  4.4 3.1*  --  3.0*  CL 92*  --  97*  97* 94*  --  93*  CO2  --   --  --  26  GLUCOSE 276*  --  251*  252* 276* 469* 465*  BUN 38*  --  44*  44* 37*  --  26*  CREATININE 5.80* 5.72* 6.01*  5.96* 6.36*  --  6.46*  CALCIUM  --   --  8.1*  8.1* 7.9*  --  8.2*  PHOS  --   --  4.5  --   --   --    CBC  Recent Labs Lab 12/17/16 0206 12/18/16 0403 12/19/16 0310 12/20/16 0350  WBC 20.6* 17.0* 10.7* 9.6  HGB 7.4* 7.1* 9.6* 9.6*  HCT 22.2* 21.5* 28.5* 29.4*  MCV 96.1 96.4 92.5 92.7  PLT 260 248 239 272     @ Medications:    . aspirin EC  81 mg Oral Daily  . atorvastatin  80 mg Oral q1800  . calcium acetate  667 mg Oral TID WC  . carvedilol  12.5 mg Oral BID WC  . clopidogrel  75 mg Oral Daily  . docusate sodium  100 mg Oral BID  . furosemide  40 mg Oral BID  . gentamicin cream  1 application Topical Daily  . heparin  5,000 Units Subcutaneous Q8H  . insulin aspart  0-20 Units Subcutaneous TID WC  . insulin aspart  0-5 Units Subcutaneous QHS  . insulin aspart  20 Units Subcutaneous TID WC  . insulin glargine  60 Units Subcutaneous Daily  . isosorbide mononitrate  30 mg Oral Daily  . losartan  25 mg Oral QHS  . mouth rinse  15 mL Mouth Rinse BID  . metoCLOPramide (REGLAN) injection  10 mg Intravenous Q6H  . pantoprazole  40 mg Oral Daily  . [START ON 01/08/2017] Vitamin  D (Ergocalciferol)  50,000 Units Oral Q30 days     Bufford ButtnerElizabeth Salena Ortlieb MD [facility] pgr [phone] 12/21/2016, 12:31 PM

## 2016-12-21 NOTE — Progress Notes (Signed)
Inpatient Diabetes Program Recommendations  AACE/ADA: New Consensus Statement on Inpatient Glycemic Control (2015)  Target Ranges:  Prepandial:   less than 140 mg/dL      Peak postprandial:   less than 180 mg/dL (1-2 hours)      Critically ill patients:  140 - 180 mg/dL   Lab Results  Component Value Date   GLUCAP 357 (H) 12/21/2016   HGBA1C 11.0 (H) 11/26/2016   Review of Glycemic Control  Diabetes history: DM 2 Outpatient Diabetes medications: NPH 70 units QHS, Novolin R 20 units tid with meals Current orders for Inpatient glycemic control: Lantus 60 units, Novolog Resistant 0-20 units tid + Novolog HS scale 0-5 units, Novolog 20 units tid meal coverage  Inpatient Diabetes Program Recommendations:    Consider increasing Lantus but order in a split dose for absorption 35 units BID (total of 70 units) home dose to start 9/13.   Thanks,  Christena DeemShannon Quadry Kampa RN, MSN, [facility] Inpatient Diabetes Coordinator Team Pager [phone] (8a-5p)

## 2016-12-21 NOTE — Progress Notes (Signed)
  Progress Note    12/21/2016 7:55 AM 6 Days Post-Op  Subjective:  Had some N/V yesterday-better today  Afebrile HR  50's-80's  100's-130's systolic 97%   Vitals:   12/21/16 0000 12/21/16 0400  BP: (!) 121/52 105/68  Pulse: 76 76  Resp: 17 15  Temp: 98 F (36.7 C) 97.9 F (36.6 C)  SpO2: 100% 96%    Physical Exam: Cardiac:  regular Lungs:  Non labored Incisions:  Wound vac with good seal Extremities:  +palpable left DP pulse   CBC    Component Value Date/Time   WBC 9.6 12/20/2016 0350   RBC 3.17 (L) 12/20/2016 0350   HGB 9.6 (L) 12/20/2016 0350   HCT 29.4 (L) 12/20/2016 0350   PLT 272 12/20/2016 0350   MCV 92.7 12/20/2016 0350   MCH 30.3 12/20/2016 0350   MCHC 32.7 12/20/2016 0350   RDW 14.6 12/20/2016 0350   LYMPHSABS 1.5 11/26/2016 0550   MONOABS 0.9 11/26/2016 0550   EOSABS 0.2 11/26/2016 0550   BASOSABS 0.0 11/26/2016 0550    BMET    Component Value Date/Time   NA 132 (L) 12/18/2016 0403   K 3.1 (L) 12/18/2016 0403   CL 94 (L) 12/18/2016 0403   CO2 27 12/18/2016 0403   GLUCOSE 469 (H) 12/19/2016 0714   BUN 37 (H) 12/18/2016 0403   CREATININE 6.36 (H) 12/18/2016 0403   CALCIUM 7.9 (L) 12/18/2016 0403   GFRNONAA 8 (L) 12/18/2016 0403   GFRAA 9 (L) 12/18/2016 0403    INR    Component Value Date/Time   INR 1.22 09/21/2015 1100     Intake/Output Summary (Last 24 hours) at 12/21/16 0755 Last data filed at 12/20/16 2240  Gross per 24 hour  Intake              420 ml  Output                0 ml  Net              420 ml     Assessment:  76 y.o. male is s/p:  Open repair of right common femoral pseudoaneurysm  6 Days Post-Op   Plan: -for left TMA by [name] on Friday -DM coordinator rec increasing Lantus to 60mg  (done) -DVT prophylaxis:  SQ heparin    Doreatha MassedSamantha Ruston Fedora, PA-C [facility] [phone] 12/21/2016 7:55 AM

## 2016-12-22 ENCOUNTER — Ambulatory Visit: Admit: 2016-12-22 | Payer: Medicare Other | Admitting: Vascular Surgery

## 2016-12-22 ENCOUNTER — Other Ambulatory Visit (INDEPENDENT_AMBULATORY_CARE_PROVIDER_SITE_OTHER): Payer: Self-pay | Admitting: Family

## 2016-12-22 LAB — GLUCOSE, CAPILLARY
GLUCOSE-CAPILLARY: 43 mg/dL — AB (ref 65–99)
GLUCOSE-CAPILLARY: 118 mg/dL — AB (ref 65–99)
GLUCOSE-CAPILLARY: 48 mg/dL — AB (ref 65–99)
GLUCOSE-CAPILLARY: 122 mg/dL — AB (ref 65–99)
Glucose-Capillary: 84 mg/dL (ref 65–99)
Glucose-Capillary: 138 mg/dL — ABNORMAL HIGH (ref 65–99)
Glucose-Capillary: 48 mg/dL — ABNORMAL LOW (ref 65–99)

## 2016-12-22 LAB — CULTURE, BLOOD (SINGLE)
Culture: NO GROWTH
Special Requests: ADEQUATE

## 2016-12-22 SURGERY — AORTOGRAM
Anesthesia: Monitor Anesthesia Care

## 2016-12-22 MED ORDER — SILVER NITRATE-POT NITRATE 75-25 % EX MISC
1.0000 | Freq: Once | CUTANEOUS | Status: DC | PRN
  Filled 2016-12-22: qty 1

## 2016-12-22 MED ORDER — TRIAMCINOLONE ACETONIDE 0.1 % MT PSTE
PASTE | Freq: Three times a day (TID) | OROMUCOSAL | Status: DC
  Administered 2016-12-22 (×2): via OROMUCOSAL
  Filled 2016-12-22: qty 5

## 2016-12-22 MED ORDER — LACTULOSE 10 GM/15ML PO SOLN: 30.0000 g | Freq: Two times a day (BID) | ORAL | Status: DC | PRN

## 2016-12-22 MED ORDER — TRIPLE ANTIBIOTIC 3.5-400-5000 EX OINT
1.0000 | TOPICAL_OINTMENT | Freq: Once | CUTANEOUS | Status: DC | PRN
Start: 2016-12-22 — End: 2016-12-27
  Filled 2016-12-22: qty 1

## 2016-12-22 MED ORDER — CHLORHEXIDINE GLUCONATE 4 % EX LIQD
60.0000 mL | Freq: Once | CUTANEOUS | Status: AC
  Administered 2016-12-23: 4 via TOPICAL
  Filled 2016-12-22: qty 60

## 2016-12-22 MED ORDER — LIDOCAINE HCL 4 % EX SOLN
0.0000 mL | Freq: Once | CUTANEOUS | Status: DC | PRN
  Filled 2016-12-22: qty 50

## 2016-12-22 MED ORDER — DEXTROSE-NACL 5-0.45 % IV SOLN
INTRAVENOUS | Status: DC
  Administered 2016-12-22: 18:00:00 via INTRAVENOUS

## 2016-12-22 MED ORDER — INSULIN ASPART 100 UNIT/ML ~~LOC~~ SOLN: 10.0000 [IU] | Freq: Three times a day (TID) | SUBCUTANEOUS | Status: DC

## 2016-12-22 MED ORDER — DEXTROSE 5 % AND 0.45 % NACL IV BOLUS: 500.0000 mL | Freq: Once | INTRAVENOUS | Status: DC

## 2016-12-22 MED ORDER — LIDOCAINE HCL 2 % EX GEL
1.0000 | Freq: Once | CUTANEOUS | Status: DC | PRN
Start: 2016-12-22 — End: 2016-12-27
  Filled 2016-12-22: qty 5

## 2016-12-22 MED ORDER — INSULIN GLARGINE 100 UNIT/ML ~~LOC~~ SOLN
40.0000 [IU] | Freq: Every day | SUBCUTANEOUS | Status: DC
  Filled 2016-12-22: qty 0.4

## 2016-12-22 MED ORDER — GENTAMICIN SULFATE 0.1 % EX CREA
1.0000 "application " | TOPICAL_CREAM | Freq: Every day | CUTANEOUS | Status: DC
  Administered 2016-12-22: 1 via TOPICAL
  Filled 2016-12-22: qty 15

## 2016-12-22 MED ORDER — DELFLEX-LC/1.5% DEXTROSE 344 MOSM/L IP SOLN
INTRAPERITONEAL | Status: DC
  Administered 2016-12-22: 19:00:00 via INTRAPERITONEAL
  Administered 2016-12-24: 5000 mL via INTRAPERITONEAL

## 2016-12-22 MED ORDER — DEXTROSE 50 % IV SOLN
INTRAVENOUS | Status: AC
  Administered 2016-12-22: 50 mL
  Filled 2016-12-22: qty 50

## 2016-12-22 MED ORDER — OXYMETAZOLINE HCL 0.05 % NA SOLN
1.0000 | Freq: Once | NASAL | Status: DC | PRN
  Filled 2016-12-22: qty 15

## 2016-12-22 MED ORDER — DEXTROSE 50 % IV SOLN
INTRAVENOUS | Status: AC
  Administered 2016-12-22: 16:00:00
  Filled 2016-12-22: qty 50

## 2016-12-22 MED ORDER — CEFAZOLIN SODIUM-DEXTROSE 2-4 GM/100ML-% IV SOLN
2.0000 g | INTRAVENOUS | Status: AC
  Administered 2016-12-23: 2 g via INTRAVENOUS
  Filled 2016-12-22 (×2): qty 100

## 2016-12-22 MED ORDER — DEXTROSE 50 % IV SOLN
INTRAVENOUS | Status: AC
Start: 2016-12-22 — End: 2016-12-22
  Administered 2016-12-22 (×2): 25 mL
  Filled 2016-12-22: qty 50

## 2016-12-22 MED ORDER — LIDOCAINE-EPINEPHRINE (PF) 1 %-1:200000 IJ SOLN
0.0000 mL | Freq: Once | INTRAMUSCULAR | Status: DC | PRN
  Filled 2016-12-22: qty 30

## 2016-12-22 NOTE — Clinical Social Work Note (Signed)
Clinical Social Work Assessment  Patient Details  Name: [name] MRN: [id number] Date of Birth: [birth year]  Date of referral:  12/22/16               Reason for consult:  Facility Placement                Permission sought to share information with:  Family Supports Permission granted to share information::  Yes, Verbal Permission Granted  Name::     [name]  Agency::     Relationship::  spouse  Contact Information:  [phone]  Housing/Transportation Living arrangements for the past 2 months:  [place] of Information:  Patient, Spouse Patient Interpreter Needed:  None Criminal Activity/Legal Involvement Pertinent to Current Situation/Hospitalization:  No - Comment as needed Significant Relationships:  Spouse Lives with:  Spouse Do you feel safe going back to the place where you live?  Yes Need for family participation in patient care:  No (Coment)  Care giving concerns: Patient from home with wife. Patient will have trans metatarsal amputation 9/14. PT recommending CIR.    Social Worker assessment / plan: CSW met with patient and wife at bedside and explained SNF backup to CIR. Patient and wife prefer patient come home with home health after surgery if possible, but are reasonable about possible need for inpatient rehab. Patient and wife agreeable to SNF if recommended and if CIR cannot take patient. They prefer a SNF in [place], where they live. CSW to follow after patient's surgery and after CIR consult and determine if patient will be accepted by CIR. CSW will support with disposition planning as needed.  Employment status:  Retired Forensic scientist:  Medicare PT Recommendations:  Inpatient [place] / Referral to community resources:  [place]  Patient/Family's Response to care: Patient and wife appreciative of care.  Patient/Family's Understanding of and Emotional Response to Diagnosis, Current  Treatment, and Prognosis: Patient and wife with good understanding of patient's condition and reasonable expectations for possible rehab. Patient and wife hopeful for speedy recovery after surgery and hopeful patient can be placed at SNF in [place] if CIR cannot take him or if home health not appropriate.  Emotional Assessment Appearance:  Appears stated age Attitude/Demeanor/Rapport:  Lethargic (feeling noticeably unwell) Affect (typically observed):  Pleasant Orientation:  Oriented to Self, Oriented to Place, Oriented to  Time, Oriented to Situation Alcohol / Substance use:  Not Applicable Psych involvement (Current and /or in the community):  No (Comment)  Discharge Needs  Concerns to be addressed:  Discharge Planning Concerns Readmission within the last 30 days:  Yes Current discharge risk:  Physical Impairment Barriers to Discharge:  Continued Medical Work up   Estanislado Emms, LCSW 12/22/2016, 4:11 PM

## 2016-12-22 NOTE — Progress Notes (Addendum)
[facility] Progress Note   Assessment/ Plan:   1. PAD- s/p open repair of R common femoral artery pseudoaneurysm- per vascular, needs more proximal amputation. Ortho consulted- plan for L TMT on Friday. 2. ESRD- Will continue with CCPD, 1.5%.  Pause has been timed to be more like his mid-day drain at home.  3. Hypertension/volume- weights coming down, closer to EDW.  Watching Bps- they are up and down. 4. Anemia (ABLA)- Hgb up to 9.6 after pRBCs. 5. Nausea, GB US with 6 mm gallbladder polyp. 6. Fever/ possible sepsis- on linezolid/ ceftaz.  Cultures negative (including PD culture, but no cell count sent) so far.  Based on what pt and wife telling me re: sore throat may need to have ENT take a look with sinonasal endoscopy.  Good that he doesn't have any more fevers.  He reports that his throat was not sore immediately after the first surgery and that this began 2 days ago and is getting worse.   7. Constipation: resolved.  Will back off lactulose. 8. Nutrition: liberalize diet to carb modified only 9. Hypokalemia: supplementing  Subjective:    Has had 4 bowel movements since lactulose.  Throat hurts worse than yesterday, pt and wife think voice changing.  "Knot" coming back on R groin.  PD was not done last night due to a low BP at the time of preferred start.   Objective:   BP (!) 136/56 (BP Location: Right Arm)   Pulse (!) 101   Temp 98.2 F (36.8 C) (Oral)   Resp (!) 21   Ht 5\' 9"  (1.753 m)   Wt 93.8 kg (206 lb 11.2 oz)   SpO2 98%   BMI 30.52 kg/m   Physical Exam: GEN lying in bed, looks like he doesn't feel well. HEENT EOMI, PERRL.  Pt not able to open mouth all the way but not frank trismus.  R side of throat tender to palpation.  He does seem hoarse. NECK no JVD PULM clear bilaterally CV RRR no m/r/g ABD distended, NABS EXT L LE with dry gauze covering the wound, trace LLE edema, no RLE edema.  R groin with some hematoma. NEURO AAO x  3  Labs: [employer]  Recent Labs Lab 12/15/16 1927 12/16/16 0550 12/18/16 0403 12/19/16 0714 12/21/16 0905  NA  --  134*  134* 132*  --  130*  K  --  4.5  4.4 3.1*  --  3.0*  CL  --  97*  97* 94*  --  93*  CO2  --  27  28 27   --  26  GLUCOSE  --  251*  252* 276* 469* 465*  BUN  --  44*  44* 37*  --  26*  CREATININE 5.72* 6.01*  5.96* 6.36*  --  6.46*  CALCIUM  --  8.1*  8.1* 7.9*  --  8.2*  PHOS  --  4.5  --   --   --    CBC  Recent Labs Lab 12/17/16 0206 12/18/16 0403 12/19/16 0310 12/20/16 0350  WBC 20.6* 17.0* 10.7* 9.6  HGB 7.4* 7.1* 9.6* 9.6*  HCT 22.2* 21.5* 28.5* 29.4*  MCV 96.1 96.4 92.5 92.7  PLT 260 248 239 272    @IMGRELPRIORS @ Medications:    . aspirin EC  81 mg Oral Daily  . atorvastatin  80 mg Oral q1800  . calcium acetate  667 mg Oral TID WC  . carvedilol  12.5 mg Oral BID WC  . clopidogrel  75 mg Oral Daily  . docusate sodium  100 mg Oral BID  . furosemide  40 mg Oral BID  . gentamicin cream  1 application Topical Daily  . heparin  5,000 Units Subcutaneous Q8H  . insulin aspart  0-20 Units Subcutaneous TID WC  . insulin aspart  0-5 Units Subcutaneous QHS  . insulin aspart  20 Units Subcutaneous TID WC  . insulin glargine  60 Units Subcutaneous Daily  . isosorbide mononitrate  30 mg Oral Daily  . lactulose  30 g Oral BID  . losartan  25 mg Oral QHS  . mouth rinse  15 mL Mouth Rinse BID  . metoCLOPramide (REGLAN) injection  10 mg Intravenous Q6H  . pantoprazole  40 mg Oral Daily  . potassium chloride  20 mEq Oral BID  . [START ON 01/08/2017] Vitamin D (Ergocalciferol)  50,000 Units Oral Q30 days     Bufford Buttner MD [facility] pgr [phone] 12/22/2016, 11:36 AM

## 2016-12-22 NOTE — Consult Note (Signed)
Reason for Consult: Sore throat Referring Physician: Thomes Lolling*  [name] is an 76 y.o. male.  HPI: He started developing sore throat yesterday. He also noticed ulcers and sores inside his mouth under the tongue. He has never had problems like this before. He is in the hospital for treatment of peripheral vascular disease related toe infection and toe amputation. He is having difficulty swallowing but no trouble breathing. He does not smoke.  Past Medical History:  Diagnosis Date  . AICD (automatic cardioverter/defibrillator) present   . CHF (congestive heart failure) ([place])   . COPD (chronic obstructive pulmonary disease) ([place])   . Coronary artery disease    CABG 2011  . Diabetes mellitus without complication ([place])   . ESRD on peritoneal dialysis [facility])    Started PD Dec 2016 in [place]. Nephrology is in [place].   [name] History of peritonitis    PD cath related peritonitis in April 2017  . Hypertension   . Peripheral vascular disease ([place])   . 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  He had a rash on his chest apparently prompting this w/u.  Never had renal bx.  He was referred to a rheum MD in [place] and per the wife he was treated with MTX and other medications but had side effects to "all of it" and isn't taking anything for this as of Jun 2017.   . Stroke [facility])     Past Surgical History:  Procedure Laterality Date  . ABDOMINAL AORTOGRAM W/LOWER EXTREMITY N/A 11/29/2016   Procedure: ABDOMINAL AORTOGRAM W/LOWER EXTREMITY;  Surgeon: Serafina Mitchell, MD;  Location: [place] CV LAB;  Service: Cardiovascular;  Laterality: N/A;  . AMPUTATION Left 11/29/2016   Procedure: LEFT THIRD TOE AMPUTATION;  Surgeon: Serafina Mitchell, MD;  Location: [facility] OR;  Service: Vascular;  Laterality: Left;  . ANGIOPLASTY  12/15/2016   Procedure: Fredderick Phenix;  Surgeon: Waynetta Sandy, MD;  Location: [place];  Service: Vascular;;  . cardiac catherization with stent placement  2017  . CORONARY ARTERY BYPASS GRAFT  2011  . FALSE ANEURYSM REPAIR Right 12/15/2016   Procedure: REPAIR FALSE ANEURYSM;  Surgeon: Waynetta Sandy, MD;  Location: [place];  Service: Vascular;  Laterality: Right;  . IRRIGATION AND DEBRIDEMENT FOOT Left 12/15/2016   Procedure: IRRIGATION AND DEBRIDEMENT FOOT;  Surgeon: Waynetta Sandy, MD;  Location: [place];  Service: Vascular;  Laterality: Left;  . LOWER EXTREMITY ANGIOGRAM Left 12/15/2016   Procedure: LOWER EXTREMITY ANGIOGRAM; PEDAL ACCESS;  Surgeon: Waynetta Sandy, MD;  Location: [place];  Service: Vascular;  Laterality: Left;  . PERIPHERAL VASCULAR BALLOON ANGIOPLASTY  11/29/2016   Procedure: PERIPHERAL VASCULAR BALLOON ANGIOPLASTY;  Surgeon: Serafina Mitchell, MD;  Location: [place] CV LAB;  Service: Cardiovascular;;  LT Peroneal AT attempted unsuccessful    Family History  Problem Relation Age of Onset  . Heart disease Father     Social History:  reports that he has quit smoking. He has never used smokeless tobacco. He reports that he does not drink alcohol or use drugs.  Allergies:  Allergies  Allergen Reactions  . Lisinopril Hives  . Methotrexate Derivatives Other (See Comments)    blisters  . Sulfa Antibiotics Hives  . Tape Hives  . Vancomycin Other (See Comments)    Blisters     Medications: Reviewed  Results for orders placed  or performed during the hospital encounter of 12/15/16 (from the past 48 hour(s))  Glucose, capillary     Status: Abnormal   Collection Time: 12/20/16  4:17 PM  Result Value Ref Range   Glucose-Capillary 125 (H) 65 - 99 mg/dL   Comment 1 Notify RN    Comment 2 Document in Chart   Glucose, capillary     Status: Abnormal   Collection Time: 12/20/16  8:55 PM  Result Value Ref Range   Glucose-Capillary 306 (H) 65 - 99 mg/dL  Glucose, capillary     Status: Abnormal    Collection Time: 12/21/16  6:12 AM  Result Value Ref Range   Glucose-Capillary 401 (H) 65 - 99 mg/dL  Glucose, capillary     Status: Abnormal   Collection Time: 12/21/16  8:13 AM  Result Value Ref Range   Glucose-Capillary 459 (H) 65 - 99 mg/dL   Comment 1 Notify RN   Basic metabolic panel     Status: Abnormal   Collection Time: 12/21/16  9:05 AM  Result Value Ref Range   Sodium 130 (L) 135 - 145 mmol/L   Potassium 3.0 (L) 3.5 - 5.1 mmol/L   Chloride 93 (L) 101 - 111 mmol/L   CO2 26 22 - 32 mmol/L   Glucose, Bld 465 (H) 65 - 99 mg/dL   BUN 26 (H) 6 - 20 mg/dL   Creatinine, Ser 6.46 (H) 0.61 - 1.24 mg/dL   Calcium 8.2 (L) 8.9 - 10.3 mg/dL   GFR calc non Af Amer 7 (L) >60 mL/min   GFR calc Af Amer 9 (L) >60 mL/min    Comment: (NOTE) The eGFR has been calculated using the CKD EPI equation. This calculation has not been validated in all clinical situations. eGFR's persistently <60 mL/min signify possible Chronic Kidney Disease.    Anion gap 11 5 - 15  Glucose, capillary     Status: Abnormal   Collection Time: 12/21/16 11:08 AM  Result Value Ref Range   Glucose-Capillary 357 (H) 65 - 99 mg/dL   Comment 1 Notify RN   Glucose, capillary     Status: Abnormal   Collection Time: 12/21/16  4:32 PM  Result Value Ref Range   Glucose-Capillary 143 (H) 65 - 99 mg/dL  Glucose, capillary     Status: None   Collection Time: 12/21/16 10:30 PM  Result Value Ref Range   Glucose-Capillary 97 65 - 99 mg/dL   Comment 1 Notify RN    Comment 2 Document in Chart   Glucose, capillary     Status: Abnormal   Collection Time: 12/22/16  5:44 AM  Result Value Ref Range   Glucose-Capillary 43 (LL) 65 - 99 mg/dL   Comment 1 Notify RN    Comment 2 Document in Chart   Glucose, capillary     Status: None   Collection Time: 12/22/16  6:29 AM  Result Value Ref Range   Glucose-Capillary 84 65 - 99 mg/dL   Comment 1 Notify RN    Comment 2 Document in Chart   Glucose, capillary     Status: Abnormal    Collection Time: 12/22/16  6:54 AM  Result Value Ref Range   Glucose-Capillary 138 (H) 65 - 99 mg/dL   Comment 1 Notify RN    Comment 2 Document in Chart   Glucose, capillary     Status: Abnormal   Collection Time: 12/22/16 11:27 AM  Result Value Ref Range   Glucose-Capillary 48 (L) 65 - 99 mg/dL  Glucose, capillary     Status: Abnormal   Collection Time: 12/22/16 12:34 PM  Result Value Ref Range   Glucose-Capillary 118 (H) 65 - 99 mg/dL    No results found.  ZOX:[id number] except as listed in admit H&P  Blood pressure (!) 136/56, pulse (!) 101, temperature 98.2 F (36.8 C), temperature source Oral, resp. rate (!) 21, height '5\' 9"'  (1.753 m), weight 93.8 kg (206 lb 11.2 oz), SpO2 98 %.  PHYSICAL EXAM: Overall appearance:  Healthy appearing, in no distress. Breathing is clear and unlabored. Voice is normal. Head:  Normocephalic, atraumatic. Ears: External ears look healthy. Nose: External nose is healthy in appearance. Internal nasal exam free of any lesions or obstruction. Oral Cavity/Pharynx:  There are several superficial viral-type ulcerations of the floor of mouth mucosa and undersurface of the tongue anteriorly. The oropharynx looks healthy clear and symmetric. Larynx/Hypopharynx: Deferred Neuro:  No identifiable neurologic deficits. Neck: No palpable neck masses.  Studies Reviewed: none  Procedures: none   Assessment/Plan: Oral cavity and oral pharyngeal ulcerations either viral, such as a herpes class virus or possibly a reaction to medication/antibiotic such as a Stevens-Johnson reaction. For now, recommend supportive treatment. Recommend gargle and swallow Magic mouthwash. If he does not have any contraindication he may also use just plain viscous lidocaine, 1 tablespoon to gargle and hold around his mouth as long as he is able to and then swallow. This can help with discomfort enough to allow him to eat or drink. The other thing that might be helpful is topical steroid  paste. I will order that. Contact me if any additional concerns arise.  Talajah Slimp 12/22/2016, 1:03 PM

## 2016-12-22 NOTE — Progress Notes (Signed)
  Progress Note    12/22/2016 3:49 PM 7 Days Post-Op  Subjective:  Says he's having a hard time swallowing and unable to eat.   Afebrile HR 70's-100's NSR 130's-150's systolic 92% RA  Vitals:   12/22/16 0908 12/22/16 1104  BP: (!) 158/70 (!) 136/56  Pulse: (!) 101 (!) 101  Resp: (!) 24 (!) 21  Temp:  98.2 F (36.8 C)  SpO2: 96% 98%    Physical Exam: Lungs:  Non labored Incisions:  Right groin incision is clean and dry; there is mild erythema that is unchanged from earlier in the week.   CBC    Component Value Date/Time   WBC 9.6 12/20/2016 0350   RBC 3.17 (L) 12/20/2016 0350   HGB 9.6 (L) 12/20/2016 0350   HCT 29.4 (L) 12/20/2016 0350   PLT 272 12/20/2016 0350   MCV 92.7 12/20/2016 0350   MCH 30.3 12/20/2016 0350   MCHC 32.7 12/20/2016 0350   RDW 14.6 12/20/2016 0350   LYMPHSABS 1.5 11/26/2016 0550   MONOABS 0.9 11/26/2016 0550   EOSABS 0.2 11/26/2016 0550   BASOSABS 0.0 11/26/2016 0550    BMET    Component Value Date/Time   NA 130 (L) 12/21/2016 0905   K 3.0 (L) 12/21/2016 0905   CL 93 (L) 12/21/2016 0905   CO2 26 12/21/2016 0905   GLUCOSE 465 (H) 12/21/2016 0905   BUN 26 (H) 12/21/2016 0905   CREATININE 6.46 (H) 12/21/2016 0905   CALCIUM 8.2 (L) 12/21/2016 0905   GFRNONAA 7 (L) 12/21/2016 0905   GFRAA 9 (L) 12/21/2016 0905    INR    Component Value Date/Time   INR 1.22 09/21/2015 1100     Intake/Output Summary (Last 24 hours) at 12/22/16 1549 Last data filed at 12/22/16 1200  Gross per 24 hour  Intake             2752 ml  Output             2275 ml  Net              477 ml     Assessment:  76 y.o. male is s/p:  Open repair of right common femoral pseudoaneurysm   7 Days Post-Op  Plan: -pt's groin essentially unchanged from earlier in the week.  Will continue to monitor. -hypoglycemia-pt glucose was > 400 yesterday and insulin adjusted per DM coordinator recommendations.  These were decreased today.   He is hypoglycemic today and  has received 2 amps of D5.  Discussed with Dr. Myra GianottiBrabham and since he is not eating, will give a 500cc bolus of D5 1/2 NS 500cc and discontinue his insulin meal coverage for now.  Continue SSI. -continue recommendations of ENT at this time. -DVT prophylaxis:  SQ heparin -for surgery with [name] tomorrow; npo after MN -labs tomorrow   Doreatha MassedSamantha Wayne Brunker, PA-C [facility] [phone] 12/22/2016 3:49 PM

## 2016-12-22 NOTE — Progress Notes (Signed)
Hypoglycemic Event  CBG:43  Treatment:D50 25 ml  Symptoms: none  Follow-up CBG: Time:0629 CBG Result:84  Possible Reasons for Event:poor appetite  Comments/MD notified:no 0630 remaining 25 ml of D50 given,CBG rechecked -138   [name]

## 2016-12-22 NOTE — Progress Notes (Signed)
CCPD tx initiated via tenckhoff w/o problem, VSS tonight, report given to Karsten RoJoanna Cook, RN

## 2016-12-22 NOTE — Anesthesia Preprocedure Evaluation (Addendum)
Anesthesia Evaluation  Patient identified by MRN, date of birth, ID band Patient awake    Reviewed: Allergy & Precautions, NPO status , Patient's Chart, lab work & pertinent test results, reviewed documented beta blocker date and time   History of Anesthesia Complications Negative for: history of anesthetic complications  Airway Mallampati: IV  TM Distance: >3 FB Neck ROM: Full    Dental  (+) Chipped, Dental Advisory Given   Pulmonary sleep apnea (BiPAP) and Continuous Positive Airway Pressure Ventilation , COPD, former smoker,    breath sounds clear to auscultation       Cardiovascular hypertension, Pt. on medications and Pt. on home beta blockers + CAD, + CABG, + Peripheral Vascular Disease and +CHF  + Cardiac Defibrillator  Rhythm:Regular Rate:Normal  EKG 12/2016 - 1st deg AVB with PVCs, septal Q waves  10/14/16 ECHO [facility]): MODERATE LV DYSFUNCTION (See above) WITH MILD LVH NORMAL RIGHT VENTRICULAR SYSTOLIC FUNCTION VALVULAR REGURGITATION: TRIVIAL AR, TRIVIAL MR, TRIVIAL PR, TRIVIAL TR NO VALVULAR STENOSIS  AICD - [employer] EMBLEM S-ICD 45CM [phone] [employer] EMBLEM S-ICD MRI PULSE GENERATOR - [id number]   Neuro/Psych CVA, No Residual Symptoms    GI/Hepatic negative GI ROS, Neg liver ROS,   Endo/Other  diabetes, Insulin DependentSjogren's, obesity  Renal/GU Dialysis and ESRFRenal disease (peritoneal)     Musculoskeletal Sjogren's   Abdominal (+) + obese,   Peds  Hematology  (+) Blood dyscrasia (Hb 10.5), anemia , plavix   Anesthesia Other Findings   Reproductive/Obstetrics                            Anesthesia Physical  Anesthesia Plan  ASA: IV  Anesthesia Plan: Regional   Post-op Pain Management:    Induction:   PONV Risk Score and Plan: 2 and Ondansetron, Treatment may vary due to age or medical condition and Propofol infusion  Airway Management Planned:  Natural Airway and Simple Face Mask  Additional Equipment: None  Intra-op Plan:   Post-operative Plan:   Informed Consent: I have reviewed the patients History and Physical, chart, labs and discussed the procedure including the risks, benefits and alternatives for the proposed anesthesia with the patient or authorized representative who has indicated his/her understanding and acceptance.   Dental advisory given  Plan Discussed with: CRNA  Anesthesia Plan Comments: (Sciatic nerve block with MAC for procedure)        Anesthesia Quick Evaluation

## 2016-12-22 NOTE — Progress Notes (Signed)
Inpatient Diabetes Program Recommendations  AACE/ADA: New Consensus Statement on Inpatient Glycemic Control (2015)  Target Ranges:  Prepandial:   less than 140 mg/dL      Peak postprandial:   less than 180 mg/dL (1-2 hours)      Critically ill patients:  140 - 180 mg/dL   Results for [name] KitchenCOMPTON, Trayon D (MRN [id number]) as of 12/22/2016 09:40  Ref. Range 12/21/2016 08:13 12/21/2016 11:08 12/21/2016 16:32 12/21/2016 22:30 12/22/2016 05:44 12/22/2016 06:29 12/22/2016 06:54  Glucose-Capillary Latest Ref Range: 65 - 99 mg/dL 782459 (H) 956357 (H) 213143 (H) 97 43 (LL) 84 138 (H)   Review of Glycemic Control  Inpatient Diabetes Program Recommendations:    Hypoglycemia this am. Unexpected looking at past trends.  Reduce meal coverage to Novolog 10 units tid. Noted poor PO intake.  Decrease Lantus to 54 units.  Reduce Correction scale due to renal function to Novolog Sensitive Correction 0-9 units tid.  Thanks,  Christena DeemShannon Reichen Hutzler RN, MSN, [facility] Inpatient Diabetes Coordinator Team Pager [phone] (8a-5p)

## 2016-12-23 ENCOUNTER — Encounter (HOSPITAL_COMMUNITY)
Admission: RE | Disposition: A | Discharge status: Rehabilitation Facility | Payer: Self-pay | Source: Ambulatory Visit | Attending: Vascular Surgery

## 2016-12-23 ENCOUNTER — Encounter (HOSPITAL_COMMUNITY): Payer: Self-pay | Admitting: *Deleted

## 2016-12-23 ENCOUNTER — Inpatient Hospital Stay (HOSPITAL_COMMUNITY): Payer: Medicare Other | Admitting: Anesthesiology

## 2016-12-23 DIAGNOSIS — J029 Acute pharyngitis, unspecified: Secondary | ICD-10-CM | POA: Diagnosis not present

## 2016-12-23 DIAGNOSIS — I96 Gangrene, not elsewhere classified: Secondary | ICD-10-CM

## 2016-12-23 DIAGNOSIS — G9340 Encephalopathy, unspecified: Secondary | ICD-10-CM | POA: Diagnosis not present

## 2016-12-23 HISTORY — PX: AMPUTATION: SHX166

## 2016-12-23 LAB — CBC
HCT: 31.6 % — ABNORMAL LOW (ref 39.0–52.0)
Hemoglobin: 10.3 g/dL — ABNORMAL LOW (ref 13.0–17.0)
MCH: 30.9 pg (ref 26.0–34.0)
MCHC: 32.6 g/dL (ref 30.0–36.0)
MCV: 94.9 fL (ref 78.0–100.0)
PLATELETS: 308 10*3/uL (ref 150–400)
RBC: 3.33 MIL/uL — ABNORMAL LOW (ref 4.22–5.81)
RDW: 14.4 % (ref 11.5–15.5)
WBC: 10.5 10*3/uL (ref 4.0–10.5)

## 2016-12-23 LAB — BODY FLUID CULTURE: CULTURE: NO GROWTH

## 2016-12-23 LAB — SURGICAL PCR SCREEN
MRSA, PCR: NEGATIVE
Staphylococcus aureus: NEGATIVE

## 2016-12-23 LAB — BASIC METABOLIC PANEL
Anion gap: 8 (ref 5–15)
BUN: 30 mg/dL — AB (ref 6–20)
CALCIUM: 8.1 mg/dL — AB (ref 8.9–10.3)
CHLORIDE: 96 mmol/L — AB (ref 101–111)
CO2: 28 mmol/L (ref 22–32)
CREATININE: 6.99 mg/dL — AB (ref 0.61–1.24)
GFR calc Af Amer: 8 mL/min — ABNORMAL LOW (ref >60)
GFR calc non Af Amer: 7 mL/min — ABNORMAL LOW (ref >60)
Glucose, Bld: 232 mg/dL — ABNORMAL HIGH (ref 65–99)
Potassium: 3.8 mmol/L (ref 3.5–5.1)
SODIUM: 132 mmol/L — AB (ref 135–145)

## 2016-12-23 LAB — GLUCOSE, CAPILLARY
GLUCOSE-CAPILLARY: 211 mg/dL — AB (ref 65–99)
GLUCOSE-CAPILLARY: 161 mg/dL — AB (ref 65–99)
GLUCOSE-CAPILLARY: 117 mg/dL — AB (ref 65–99)
Glucose-Capillary: 217 mg/dL — ABNORMAL HIGH (ref 65–99)
Glucose-Capillary: 113 mg/dL — ABNORMAL HIGH (ref 65–99)
Glucose-Capillary: 224 mg/dL — ABNORMAL HIGH (ref 65–99)

## 2016-12-23 SURGERY — AMPUTATION, FOOT, PARTIAL
Anesthesia: Regional | Site: Foot | Laterality: Left

## 2016-12-23 MED ORDER — ROPIVACAINE HCL 5 MG/ML IJ SOLN
INTRAMUSCULAR | Status: DC | PRN
  Administered 2016-12-23: 30 mL via PERINEURAL

## 2016-12-23 MED ORDER — CLOPIDOGREL BISULFATE 75 MG PO TABS
75.0000 mg | ORAL_TABLET | Freq: Once | ORAL | Status: AC
  Administered 2016-12-23: 75 mg via ORAL

## 2016-12-23 MED ORDER — ONDANSETRON HCL 4 MG/2ML IJ SOLN
INTRAMUSCULAR | Status: DC | PRN
  Administered 2016-12-23: 4 mg via INTRAVENOUS

## 2016-12-23 MED ORDER — GENTAMICIN SULFATE 0.1 % EX CREA: 1.0000 "application " | TOPICAL_CREAM | Freq: Every day | CUTANEOUS | Status: DC

## 2016-12-23 MED ORDER — ACETAMINOPHEN 650 MG RE SUPP: 650.0000 mg | Freq: Four times a day (QID) | RECTAL | Status: DC | PRN

## 2016-12-23 MED ORDER — SODIUM CHLORIDE 0.9 % IV SOLN
INTRAVENOUS | Status: DC
  Administered 2016-12-23: 08:00:00 via INTRAVENOUS

## 2016-12-23 MED ORDER — CEFAZOLIN SODIUM-DEXTROSE 1-4 GM/50ML-% IV SOLN
1.0000 g | INTRAVENOUS | Status: AC
  Administered 2016-12-23 – 2016-12-24 (×2): 1 g via INTRAVENOUS
  Filled 2016-12-23 (×3): qty 50

## 2016-12-23 MED ORDER — BISACODYL 10 MG RE SUPP: 10.0000 mg | Freq: Every day | RECTAL | Status: DC | PRN

## 2016-12-23 MED ORDER — POLYETHYLENE GLYCOL 3350 17 G PO PACK: 17.0000 g | PACK | Freq: Every day | ORAL | Status: DC | PRN

## 2016-12-23 MED ORDER — ONDANSETRON HCL 4 MG PO TABS: 4.0000 mg | ORAL_TABLET | Freq: Four times a day (QID) | ORAL | Status: DC | PRN

## 2016-12-23 MED ORDER — DOCUSATE SODIUM 100 MG PO CAPS
100.0000 mg | ORAL_CAPSULE | Freq: Two times a day (BID) | ORAL | Status: DC
  Administered 2016-12-23 – 2016-12-27 (×7): 100 mg via ORAL
  Filled 2016-12-23 (×6): qty 1

## 2016-12-23 MED ORDER — PROPOFOL 10 MG/ML IV BOLUS
INTRAVENOUS | Status: AC
  Filled 2016-12-23: qty 20

## 2016-12-23 MED ORDER — METOCLOPRAMIDE HCL 5 MG/ML IJ SOLN: 5.0000 mg | Freq: Three times a day (TID) | INTRAMUSCULAR | Status: DC | PRN

## 2016-12-23 MED ORDER — ONDANSETRON HCL 4 MG/2ML IJ SOLN: 4.0000 mg | Freq: Once | INTRAMUSCULAR | Status: DC | PRN

## 2016-12-23 MED ORDER — PROPOFOL 500 MG/50ML IV EMUL
INTRAVENOUS | Status: DC | PRN
  Administered 2016-12-23: 50 ug/kg/min via INTRAVENOUS

## 2016-12-23 MED ORDER — POTASSIUM CHLORIDE CRYS ER 20 MEQ PO TBCR: 20.0000 meq | EXTENDED_RELEASE_TABLET | Freq: Once | ORAL | Status: AC

## 2016-12-23 MED ORDER — ONDANSETRON HCL 4 MG/2ML IJ SOLN
INTRAMUSCULAR | Status: AC
  Filled 2016-12-23: qty 2

## 2016-12-23 MED ORDER — FENTANYL CITRATE (PF) 100 MCG/2ML IJ SOLN: 25.0000 ug | INTRAMUSCULAR | Status: DC | PRN

## 2016-12-23 MED ORDER — FENTANYL CITRATE (PF) 100 MCG/2ML IJ SOLN
INTRAMUSCULAR | Status: DC | PRN
  Administered 2016-12-23: 50 ug via INTRAVENOUS

## 2016-12-23 MED ORDER — ASPIRIN EC 81 MG PO TBEC: 81.0000 mg | DELAYED_RELEASE_TABLET | Freq: Once | ORAL | Status: DC

## 2016-12-23 MED ORDER — HYDROMORPHONE HCL 1 MG/ML IJ SOLN: 1.0000 mg | INTRAMUSCULAR | Status: DC | PRN

## 2016-12-23 MED ORDER — ONDANSETRON HCL 4 MG/2ML IJ SOLN
4.0000 mg | Freq: Four times a day (QID) | INTRAMUSCULAR | Status: DC | PRN
  Administered 2016-12-26: 4 mg via INTRAVENOUS
  Filled 2016-12-23: qty 2

## 2016-12-23 MED ORDER — MUPIROCIN 2 % EX OINT
TOPICAL_OINTMENT | CUTANEOUS | Status: AC
  Administered 2016-12-23: 05:00:00
  Filled 2016-12-23: qty 22

## 2016-12-23 MED ORDER — FENTANYL CITRATE (PF) 100 MCG/2ML IJ SOLN
INTRAMUSCULAR | Status: AC
  Administered 2016-12-23: 50 ug via INTRAVENOUS
  Filled 2016-12-23: qty 2

## 2016-12-23 MED ORDER — METHOCARBAMOL 1000 MG/10ML IJ SOLN
500.0000 mg | Freq: Four times a day (QID) | INTRAMUSCULAR | Status: DC | PRN
  Filled 2016-12-23: qty 5

## 2016-12-23 MED ORDER — PROPOFOL 1000 MG/100ML IV EMUL
INTRAVENOUS | Status: AC
  Filled 2016-12-23: qty 100

## 2016-12-23 MED ORDER — ACETAMINOPHEN 325 MG PO TABS: 650.0000 mg | ORAL_TABLET | Freq: Four times a day (QID) | ORAL | Status: DC | PRN

## 2016-12-23 MED ORDER — FENTANYL CITRATE (PF) 100 MCG/2ML IJ SOLN
50.0000 ug | Freq: Once | INTRAMUSCULAR | Status: AC
  Administered 2016-12-23: 50 ug via INTRAVENOUS

## 2016-12-23 MED ORDER — INSULIN GLARGINE 100 UNIT/ML ~~LOC~~ SOLN
12.0000 [IU] | Freq: Every day | SUBCUTANEOUS | Status: DC
  Administered 2016-12-24: 12 [IU] via SUBCUTANEOUS
  Filled 2016-12-23 (×2): qty 0.12

## 2016-12-23 MED ORDER — FENTANYL CITRATE (PF) 250 MCG/5ML IJ SOLN
INTRAMUSCULAR | Status: AC
  Filled 2016-12-23: qty 5

## 2016-12-23 MED ORDER — 0.9 % SODIUM CHLORIDE (POUR BTL) OPTIME
TOPICAL | Status: DC | PRN
  Administered 2016-12-23: 1000 mL

## 2016-12-23 MED ORDER — METOCLOPRAMIDE HCL 5 MG PO TABS: 5.0000 mg | ORAL_TABLET | Freq: Three times a day (TID) | ORAL | Status: DC | PRN

## 2016-12-23 MED ORDER — ISOSORBIDE MONONITRATE ER 30 MG PO TB24: 30.0000 mg | ORAL_TABLET | Freq: Once | ORAL | Status: DC

## 2016-12-23 MED ORDER — OXYCODONE HCL 5 MG PO TABS
5.0000 mg | ORAL_TABLET | ORAL | Status: DC | PRN
  Administered 2016-12-25 – 2016-12-27 (×3): 10 mg via ORAL
  Filled 2016-12-23: qty 1
  Filled 2016-12-23 (×3): qty 2

## 2016-12-23 MED ORDER — SODIUM CHLORIDE 0.9 % IV SOLN
INTRAVENOUS | Status: DC
  Administered 2016-12-23: 14:00:00 via INTRAVENOUS

## 2016-12-23 MED ORDER — TRIAMCINOLONE ACETONIDE 0.1 % MT PSTE
PASTE | Freq: Three times a day (TID) | OROMUCOSAL | Status: DC
  Administered 2016-12-23 – 2016-12-25 (×5): via OROMUCOSAL
  Administered 2016-12-25: 1 via OROMUCOSAL
  Administered 2016-12-25: 16:00:00 via OROMUCOSAL
  Administered 2016-12-26: 1 via OROMUCOSAL
  Administered 2016-12-26 (×2): via OROMUCOSAL

## 2016-12-23 MED ORDER — MAGNESIUM CITRATE PO SOLN: 1.0000 | Freq: Once | ORAL | Status: DC | PRN

## 2016-12-23 MED ORDER — PANTOPRAZOLE SODIUM 40 MG PO TBEC
40.0000 mg | DELAYED_RELEASE_TABLET | Freq: Once | ORAL | Status: AC
  Administered 2016-12-23: 40 mg via ORAL

## 2016-12-23 MED ORDER — METHOCARBAMOL 500 MG PO TABS
500.0000 mg | ORAL_TABLET | Freq: Four times a day (QID) | ORAL | Status: DC | PRN
  Administered 2016-12-25: 500 mg via ORAL
  Filled 2016-12-23 (×2): qty 1

## 2016-12-23 MED ORDER — HEPARIN 1000 UNIT/ML FOR PERITONEAL DIALYSIS
2500.0000 [IU] | INTRAMUSCULAR | Status: DC | PRN
  Filled 2016-12-23: qty 2.5

## 2016-12-23 SURGICAL SUPPLY — 35 items
BENZOIN TINCTURE PRP APPL 2/3 (GAUZE/BANDAGES/DRESSINGS) ×2 IMPLANT
BLADE SAW SGTL HD 18.5X60.5X1. (BLADE) ×2 IMPLANT
BLADE SURG 21 STRL SS (BLADE) ×2 IMPLANT
BNDG COHESIVE 4X5 TAN STRL (GAUZE/BANDAGES/DRESSINGS) ×2 IMPLANT
BNDG GAUZE ELAST 4 BULKY (GAUZE/BANDAGES/DRESSINGS) ×2 IMPLANT
COVER SURGICAL LIGHT HANDLE (MISCELLANEOUS) ×2 IMPLANT
DRAPE INCISE IOBAN 66X45 STRL (DRAPES) ×4 IMPLANT
DRAPE U-SHAPE 47X51 STRL (DRAPES) ×2 IMPLANT
DRESSING PREVENA PLUS CUSTOM (GAUZE/BANDAGES/DRESSINGS) ×1 IMPLANT
DRSG ADAPTIC 3X8 NADH LF (GAUZE/BANDAGES/DRESSINGS) ×2 IMPLANT
DRSG PAD ABDOMINAL 8X10 ST (GAUZE/BANDAGES/DRESSINGS) IMPLANT
DRSG PREVENA PLUS CUSTOM (GAUZE/BANDAGES/DRESSINGS) ×2
DURAPREP 26ML APPLICATOR (WOUND CARE) ×2 IMPLANT
ELECT REM PT RETURN 9FT ADLT (ELECTROSURGICAL) ×2
ELECTRODE REM PT RTRN 9FT ADLT (ELECTROSURGICAL) ×1 IMPLANT
GAUZE SPONGE 4X4 12PLY STRL (GAUZE/BANDAGES/DRESSINGS) ×2 IMPLANT
GLOVE BIOGEL PI IND STRL 9 (GLOVE) ×1 IMPLANT
GLOVE BIOGEL PI INDICATOR 9 (GLOVE) ×1
GLOVE SURG ORTHO 9.0 STRL STRW (GLOVE) ×2 IMPLANT
GOWN STRL REUS W/ TWL XL LVL3 (GOWN DISPOSABLE) ×3 IMPLANT
GOWN STRL REUS W/TWL XL LVL3 (GOWN DISPOSABLE) ×3
KIT BASIN OR (CUSTOM PROCEDURE TRAY) ×2 IMPLANT
KIT ROOM TURNOVER OR (KITS) ×2 IMPLANT
NS IRRIG 1000ML POUR BTL (IV SOLUTION) ×2 IMPLANT
PACK ORTHO EXTREMITY (CUSTOM PROCEDURE TRAY) ×2 IMPLANT
PAD ARMBOARD 7.5X6 YLW CONV (MISCELLANEOUS) ×4 IMPLANT
SPONGE LAP 18X18 X RAY DECT (DISPOSABLE) IMPLANT
SUT ETHILON 2 0 PSLX (SUTURE) ×4 IMPLANT
SUT VIC AB 2-0 CTB1 (SUTURE) IMPLANT
TOWEL OR 17X24 6PK STRL BLUE (TOWEL DISPOSABLE) ×2 IMPLANT
TOWEL OR 17X26 10 PK STRL BLUE (TOWEL DISPOSABLE) ×2 IMPLANT
TUBE CONNECTING 12X1/4 (SUCTIONS) ×2 IMPLANT
WATER STERILE IRR 1000ML POUR (IV SOLUTION) ×2 IMPLANT
WND VAC CANISTER 500ML (MISCELLANEOUS) ×2 IMPLANT
YANKAUER SUCT BULB TIP NO VENT (SUCTIONS) ×2 IMPLANT

## 2016-12-23 NOTE — Transfer of Care (Signed)
Immediate Anesthesia Transfer of Care Note  Patient: [name]  Procedure(s) Performed: Procedure(s): Left Transmetatarsal Amputation (Left)  Patient Location: PACU  Anesthesia Type:MAC and Regional  Level of Consciousness: awake, alert  and oriented  Airway & Oxygen Therapy: Patient Spontanous Breathing  Post-op Assessment: Report given to RN  Post vital signs: Reviewed and stable  Last Vitals:  Vitals:   12/23/16 0400 12/23/16 0539  BP: (!) 143/59 (!) 157/64  Pulse: 67 77  Resp: 15 18  Temp:  36.9 C  SpO2: 99% 97%    Last Pain:  Vitals:   12/23/16 0539  TempSrc: Oral  PainSc:       Patients Stated Pain Goal: 0 (28/36/62 9476)  Complications: No apparent anesthesia complications

## 2016-12-23 NOTE — Progress Notes (Signed)
Patient ID: [name], male   DOB: December 28, 1940, 76 y.o.   MRN: [id number] Appreciate help from Dr. Lajoyce Corners with the open amputation and back placement today and also hospitalist for multiple medical problems.  Spoke with the patient and multiple family members today. Does have good flow to the dorsum of his right foot with a VAC in place. Right groin stable with no evidence of expansile hematoma. 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

## 2016-12-23 NOTE — Progress Notes (Signed)
Inpatient Diabetes Program Recommendations  AACE/ADA: New Consensus Statement on Inpatient Glycemic Control (2015)  Target Ranges:  Prepandial:   less than 140 mg/dL      Peak postprandial:   less than 180 mg/dL (1-2 hours)      Critically ill patients:  140 - 180 mg/dL   Lab Results  Component Value Date   GLUCAP 211 (H) 12/23/2016   HGBA1C 11.0 (H) 11/26/2016    Review of Glycemic ControlResults for DIANA, ARMIJO (MRN [id number]) as of 12/23/2016 10:22  Ref. Range 12/22/2016 12:34 12/22/2016 15:11 12/22/2016 21:19 12/23/2016 06:21 12/23/2016 07:50  Glucose-Capillary Latest Ref Range: 65 - 99 mg/dL 213 (H) 48 (L) 086 (H) 217 (H) 211 (H)   Diabetes history:DM 2 Outpatient Diabetes medications: NPH 70 units QHS, Novolin R 20 units tid with meals Current orders for Inpatient glycemic control: Lantus 40 units, Novolog Resistant 0-20 units tid + Novolog HS scale 0-5 units  Inpatient Diabetes Program Recommendations:    Note that patient did not receive Lantus yesterday due to lows.  Lantus dose has been reduced. Please reduce Novolog correction to sensitive tid with meals and HS. Once eating, consider adding Novolog meal coverage 5 units tid with meals.    Thanks, Beryl Meager, RN, BC-ADM Inpatient Diabetes Coordinator Pager [phone] (8a-5p)

## 2016-12-23 NOTE — Progress Notes (Signed)
PT Cancellation Note  Patient Details Name: [name] MRN: [id number] DOB: February 05, 1941   Cancelled Treatment:    Reason Eval/Treat Not Completed: Patient at procedure or test/unavailable Pt is having 3rd toe revision surgery today. PT will follow as time allows today.  Neleh Muldoon B. Beverely Risen PT, DPT Acute Rehabilitation  [phone] Pager [phone]  Elon Alas Fleet 12/23/2016, 7:57 AM

## 2016-12-23 NOTE — Interval H&P Note (Signed)
History and Physical Interval Note:  12/23/2016 6:23 AM  [name]  has presented today for surgery, with the diagnosis of Gangrene Left Foot  The various methods of treatment have been discussed with the patient and family. After consideration of risks, benefits and other options for treatment, the patient has consented to  Procedure(s): Left Transmetatarsal Amputation (Left) as a surgical intervention .  The patient's history has been reviewed, patient examined, no change in status, stable for surgery.  I have reviewed the patient's chart and labs.  Questions were answered to the patient's satisfaction.     [name]

## 2016-12-23 NOTE — Anesthesia Postprocedure Evaluation (Signed)
Anesthesia Post Note  Patient: [name]  Procedure(s) Performed: Procedure(s) (LRB): Left Transmetatarsal Amputation (Left)     Patient location during evaluation: PACU Anesthesia Type: Regional Level of consciousness: awake and alert Pain management: pain level controlled Vital Signs Assessment: post-procedure vital signs reviewed and stable Respiratory status: spontaneous breathing, nonlabored ventilation, respiratory function stable and patient connected to nasal cannula oxygen Cardiovascular status: stable and blood pressure returned to baseline Postop Assessment: no apparent nausea or vomiting Anesthetic complications: no    Last Vitals:  Vitals:   12/23/16 1034 12/23/16 1050  BP: (!) 127/53 (!) 137/52  Pulse: 74 73  Resp: 15 16  Temp:    SpO2: 98% 100%    Last Pain:  Vitals:   12/23/16 0539  TempSrc: Oral  PainSc:                  Beryle Lathe

## 2016-12-23 NOTE — Anesthesia Procedure Notes (Signed)
Anesthesia Regional Block: Popliteal block   Pre-Anesthetic Checklist: ,, timeout performed, Correct Patient, Correct Site, Correct Laterality, Correct Procedure, Correct Position, site marked, Risks and benefits discussed,  Surgical consent,  Pre-op evaluation,  At surgeon's request and post-op pain management  Laterality: Left  Prep: chloraprep       Needles:  Injection technique: Single-shot  Needle Type: Echogenic Needle     Needle Length: 9cm  Needle Gauge: 21     Additional Needles:   Narrative:  Start time: 12/23/2016 8:48 AM End time: 12/23/2016 8:52 AM Injection made incrementally with aspirations every 5 mL.  Performed by: Personally  Anesthesiologist: Leslye Peer E  Additional Notes: No pain on injection. No increased resistance to injection. Injection made in 5cc increments. Good needle visualization. Patient tolerated the procedure well.

## 2016-12-23 NOTE — H&P (View-Only) (Signed)
ORTHOPAEDIC CONSULTATION  REQUESTING PHYSICIAN: Cain, Brandon Christophe*  Chief Complaint: open wound left foot status post revascularization.  HPI: Joshua [name] is a 76 y.o. male who presents with patient is a 76 year old gentlemandiabetic end-stage renal disease on peritoneal dialysis who is status post revascularization to left lower extremity. He is status post third ray amputation has a wouJuventino Slovaknd VAC in place.  Past Medical History:  Diagnosis Date  . AICD (automatic cardioverter/defibrillator) present   . CHF (congestive heart failure) (HCC)   . COPD (chronic obstructive pulmonary disease) (HCC)   . Coronary artery disease    CABG 2011  . Diabetes mellitus without complication (HCC)   . ESRD on peritoneal dialysis [facility](HCC)    [place]   Joshua Kitchen. History of peritonitis    PD cath related peritonitis in April 2017  . Hypertension   . Peripheral vascular disease (HCC)   . Sjogren's syndrome (HCC)    Per pt's wife, was diagnosed in 2016 during w/u for cause of renal failure prior to starting dialysis.  He had a rash on his chest apparently prompting this w/u.  Never had renal bx.  He [place]um [place] in [place] and per the wife he was treated with MTX and other medications but had side effects to "all of it" and isn't taking anything for this as of Jun 2017.   . Stroke [facility](HCC)    Past Surgical History:  Procedure Laterality Date  . ABDOMINAL AORTOGRAM W/LOWER EXTREMITY N/A 11/29/2016   Procedure: ABDOMINAL AORTOGRAM W/LOWER EXTREMITY;  Surgeon: Nada LibmanBrabham, Vance W, MD;  [facility];  Service: Cardiovascular;  Laterality: N/A;  . AMPUTATION Left 11/29/2016   Procedure: LEFT THIRD TOE AMPUTATION;  Surgeon: Nada LibmanBrabham, Vance W, MD;  [facility];  Service: Vascular;  Laterality: Left;  . ANGIOPLASTY  12/15/2016   Procedure: Peyton BottomsBALLOON ANGIOPLASTY;  Surgeon: Maeola Harmanain, Brandon Christopher, MD;  [facility];  Service:  Vascular;;  . cardiac catherization with stent placement  2017  . CORONARY ARTERY BYPASS GRAFT  2011  . FALSE ANEURYSM REPAIR Right 12/15/2016   Procedure: REPAIR FALSE ANEURYSM;  Surgeon: Maeola Harmanain, Brandon Christopher, MD;  [facility];  Service: Vascular;  Laterality: Right;  . IRRIGATION AND DEBRIDEMENT FOOT Left 12/15/2016   Procedure: IRRIGATION AND DEBRIDEMENT FOOT;  Surgeon: Maeola Harmanain, Brandon Christopher, MD;  [facility];  Service: Vascular;  Laterality: Left;  . LOWER EXTREMITY ANGIOGRAM Left 12/15/2016   Procedure: LOWER EXTREMITY ANGIOGRAM; PEDAL ACCESS;  Surgeon: Maeola Harmanain, Brandon Christopher, MD;  [facility];  Service: Vascular;  Laterality: Left;  . PERIPHERAL VASCULAR BALLOON ANGIOPLASTY  11/29/2016   Procedure: PERIPHERAL VASCULAR BALLOON ANGIOPLASTY;  Surgeon: Nada LibmanBrabham, Vance W, MD;  [facility];  Service: Cardiovascular;;  LT Peroneal AT attempted unsuccessful   Social History   Social History  . Marital status: Married    Spouse name: N/A  . Number of children: N/A  . Years of education: N/A   Social History Main Topics  . Smoking status: Former Games developermoker  . Smokeless tobacco: Never Used  . Alcohol use No  . Drug use: No  . Sexual activity: No   Other Topics Concern  . None   Social History Narrative  . None   Family History  Problem Relation Age of Onset  . Heart disease Father    - negative except otherwise stated in the family history section Allergies  Allergen Reactions  . Lisinopril Hives  . Methotrexate Derivatives Other (See Comments)    blisters  . Sulfa Antibiotics Hives  . Tape Hives  . Vancomycin Other (See Comments)    Blisters    Prior to Admission medications   Medication Sig Start Date End Date Taking? Authorizing Provider  amoxicillin (AMOXIL) 500 MG capsule Take 2,000 mg by mouth See admin instructions. 1 hour prior to dental procedures   Yes [provider]  aspirin EC 81 MG tablet Take 81 mg by mouth daily.    Yes [provider]  atorvastatin (LIPITOR) 80 MG tablet Take 80 mg by mouth daily at 6 PM.   Yes [provider]  calcium acetate (PHOSLO) 667 MG capsule Take 667 mg by mouth 3 (three) times daily with meals.   Yes [provider]  carvedilol (COREG) 12.5 MG tablet Take 12.5 mg by mouth 2 (two) times daily with a meal.   Yes [provider]  clopidogrel (PLAVIX) 75 MG tablet Take 75 mg by mouth daily.   Yes [provider]  docusate sodium (COLACE) 100 MG capsule Take 100 mg by mouth 2 (two) times daily.    Yes [provider]  furosemide (LASIX) 40 MG tablet Take 40 mg by mouth 2 (two) times daily.   Yes [provider]  insulin NPH Human (HUMULIN N,NOVOLIN N) 100 UNIT/ML injection Inject 70 Units into the skin at bedtime.   Yes [provider]  insulin regular (NOVOLIN R,HUMULIN R) 100 units/mL injection Inject 20 Units into the skin 3 (three) times daily before meals.   Yes [provider]  isosorbide mononitrate (IMDUR) 30 MG 24 hr tablet Take 30 mg by mouth daily.   Yes [provider]  losartan (COZAAR) 25 MG tablet Take 25 mg by mouth at bedtime.    Yes [provider]  nitroGLYCERIN (NITROSTAT) 0.4 MG SL tablet Place 0.4 mg under the tongue every 5 (five) minutes as needed for chest pain.   Yes [provider]  ondansetron (ZOFRAN) 4 MG tablet Take 4 mg by mouth every 6 (six) hours as needed for nausea or vomiting.   Yes [provider]  oxyCODONE-acetaminophen (PERCOCET/ROXICET) 5-325 MG tablet Take 1 tablet by mouth every 6 (six) hours as needed for severe pain. 11/30/16  Yes Raymond Gurney, PA-C  Vitamin D, Ergocalciferol, (DRISDOL) 50000 units CAPS capsule Take 50,000 Units by mouth every 30 (thirty) days.    Yes [provider]   US Abdomen Limited  Result Date: 12/19/2016 CLINICAL DATA:  3-4 days of nausea with fever.  History of diabetes. EXAM: ULTRASOUND  ABDOMEN LIMITED RIGHT UPPER QUADRANT COMPARISON:  None in PACs FINDINGS: Gallbladder: The gallbladder is adequately distended. There is an echogenic non mobile nonshadowing focus near the gallbladder neck compatible with a polyp measuring up 6 mm in diameter. There is no gallbladder wall thickening, pericholecystic fluid, or positive sonographic Murphy's sign. Common bile duct: Diameter: 4.5 mm. Liver: No focal lesion identified. Within normal limits in parenchymal echogenicity. Portal vein is patent on color Doppler imaging with normal direction of blood flow towards the liver. IMPRESSION: Probable 6 mm gallbladder polyp near the neck. No sonographic evidence of acute cholecystitis. Electronically Signed   By: David  [place] M.D.   On: 12/19/2016 12:27   - pertinent xrays, CT, MRI studies were reviewed and independently interpreted  Positive ROS: All other systems have been reviewed and were otherwise negative with the exception of those  mentioned in the HPI and as above.  Physical Exam: General: Alert, no acute distress Psychiatric: Patient is competent for consent with normal mood and affect Lymphatic: No axillary or cervical lymphadenopathy Cardiovascular: No pedal edema Respiratory: No cyanosis, no use of accessory musculature GI: No organomegaly, abdomen is soft and non-tender  Skin: patient has ischemic changes to the fourth toe open wound with a functioning wound VAC over the third ray amputation. Asian has a strong dorsalis pedis pulse   Neurologic: Patient does not have protective sensation bilateral lower extremities.   MUSCULOSKELETAL:  There is no ascending cellulitis. He has a strong dorsalis pedis pulse. Wound VAC is functioning well. Status post third ray amputation.  Assessment: Assessment: Peripheral vascular disease status post 3 vessel physician the left lower extremity status post third ray amputation with gangrenous fourth toe with diabetes end stage renal disease on  peritoneal dialysis.  Plan: Plan: I feel the patient best option would be to proceed with a transmetatarsal amputation. Risks and benefits were discussed including risk of the wound not healing. Discussed that I can proceed on Friday with surgery. Patient states he understands and would like to proceed with surgery.  Thank you for the consult and the opportunity to see [name]  Erisa Mehlman, MD [facility] [phone] 7:18 AM     

## 2016-12-23 NOTE — Progress Notes (Addendum)
  [facility] Progress Note   Assessment/ Plan:   1. 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  S/p L TMT today.   2. ESRD- Will continue with CCPD, 1.5% again tonight.  Eliminated pause.   3. Hypertension/volume- weights coming down, closer to EDW.  Watching Bps- they are up and down. 4. Anemia (ABLA)- Hgb 10.3, will resume ESA if needed 5. Nausea, GB US with 6 mm gallbladder polyp. 6. Fever/ possible sepsis- on linezolid/ ceftaz.  PD cultures negative so far.  Unfortunately no cell ct was sent- so that doesn't rule out the possibility of culture neg peritonitis which would have been proven/disproven with the cell ct.  I'm sending a cell ct tonight.  ? If there was a viral syndrome causing the mucositis.  Apprec ENT. 7. Constipation: resolved.  Will back off lactulose. 8. Nutrition: liberalize diet to carb modified only 9. Hypokalemia: supplementing  Subjective:    S/p L TMT.  Feeling OK.  Family at bedside.   Objective:   BP (!) 143/51 (BP Location: Right Arm)   Pulse 72   Temp 97.6 F (36.4 C) (Oral)   Resp 16   Ht  (1.753 m)   Wt 93 kg (205 lb)   SpO2 98%   BMI 30.27 kg/m   Physical Exam: GEN lying in bed,  NAD HEENT EOMI, PERRL.  Healing OP ulcers NECK no JVD PULM clear bilaterally CV RRR no m/r/g ABD distended, NABS EXT L LE s/p TMT with woundvac in place, trace LLE edema, no RLE edema.  R groin with some firmness NEURO AAO x 3  Labs: BMET  Recent Labs Lab 12/18/16 0403 12/19/16 0714 12/21/16 0905 12/23/16 0652  NA 132*  --  130* 132*  K 3.1*  --  3.0* 3.8  CL 94*  --  93* 96*  CO2 27  --  26 28  GLUCOSE 276* 469* 465* 232*  BUN 37*  --  26* 30*  CREATININE 6.36*  --  6.46* 6.99*  CALCIUM 7.9*  --  8.2* 8.1*   CBC  Recent Labs Lab 12/18/16 0403 12/19/16 0310 12/20/16 0350 12/23/16 0652  WBC 17.0* 10.7* 9.6 10.5  HGB 7.1* 9.6* 9.6* 10.3*  HCT 21.5* 28.5* 29.4* 31.6*  MCV 96.4 92.5  92.7 94.9  PLT 248 239 272 308    @ Medications:    . aspirin EC  81 mg Oral Daily  . atorvastatin  80 mg Oral q1800  . calcium acetate  667 mg Oral TID WC  . carvedilol  12.5 mg Oral BID WC  . clopidogrel  75 mg Oral Daily  . docusate sodium  100 mg Oral BID  . docusate sodium  100 mg Oral BID  . furosemide  40 mg Oral BID  . gentamicin cream  1 application Topical Daily  . heparin  5,000 Units Subcutaneous Q8H  . [START ON 12/24/2016] insulin glargine  12 Units Subcutaneous Daily  . isosorbide mononitrate  30 mg Oral Daily  . losartan  25 mg Oral QHS  . mouth rinse  15 mL Mouth Rinse BID  . pantoprazole  40 mg Oral Daily  . potassium chloride  20 mEq Oral BID  . triamcinolone   Mouth/Throat TID  . [START ON 01/08/2017] Vitamin D (Ergocalciferol)  50,000 Units Oral Q30 days     Bufford Buttner MD [facility] pgr [phone] 12/23/2016, 1:48 PM

## 2016-12-23 NOTE — Progress Notes (Signed)
PT Cancellation Note  Patient Details Name: ZAEL SHUMAN MRN: [id number] DOB: 10/10/1940   Cancelled Treatment:    Reason Eval/Treat Not Completed: Patient declined, no reason specified Pt reports that he is just not feeling well after surgery and requests that PT be deferred. PT will follow as able.   Duana Benedict B. Beverely Risen PT, DPT Acute Rehabilitation  [phone] Pager [phone]  Elon Alas Fleet 12/23/2016, 3:18 PM

## 2016-12-23 NOTE — Progress Notes (Signed)
Orthopedic Tech Progress Note Patient Details:  [name] 1940-07-03 [id number]  Ortho Devices Type of Ortho Device: Postop shoe/boot   Saul Fordyce 12/23/2016, 12:51 PM

## 2016-12-23 NOTE — Consult Note (Signed)
[facility] Medical Consultation  [name] WGN:[id number] DOB: 06/21/1940 DOA: 12/15/2016 PCP: System, Pcp Not In   Requesting physician: Lelon Mast PA with vascular Date of consultation: 12/23/16 Reason for consultation: uncontrolled diabetes/?ams  Impression/Recommendations Principal Problem:   Uncontrolled type 2 diabetes mellitus with hyperglycemia, with long-term current use of insulin (HCC) Active Problems:   ESRD on peritoneal dialysis (HCC)   CAD (coronary artery disease) of artery bypass graft   Gangrene of left foot (HCC)   PAD (peripheral artery disease) (HCC)   Encephalopathy   Sore throat   #1. Uncontrolled type 2 diabetes. Patient has experienced quite labile capillary blood sugars. Home regimen includes NPH 70 units at night, Novolin 20 units 3 times a day with meals. Was evaluated by diabetes coordinator who recommended decreasing Lantus changing sliding scale to sensitive -Have decreased Lantus to 12 units -Have changed sliding scale to sensitive -No meal coverage until appetite more reliable  #2. Acute encephalopathy. reportedly patient experienced altered mental status during this hospitalization. The time of my exam patient lethargic but oriented 4. Neuro exam benign. Since been in the hospital for 8 days. Suspect episode related to pain meds, anesthesia, uncontrolled diabetes in setting of fever. All cultures negative, received antibiotics, metabolic labs close to his baseline -hold altering meds as able -improved control diabetes -continue antibiotics -follow labs on peritonial fluid -mobilize -if has another episode recommend CT head  #3. Peripheral artery disease/gangrene of the left foot. Status post amputation per orthopedics today as failed conservative management -Defer to orthopedics/vascular  #4. End-stage renal disease on peritoneal dialysis. Nephrology managing -Appreciate nephrology  #5. Sore throat. Evaluated by dr Pollyann Kennedy with ENT.  Recommended magic mouthwash -continue magic mouthwash    [facility] will follow. Thank you for this consultation.  Chief Complaint: uncontrolled diabetes  HPI:  [name] 76 year old past medical history includes CHF, COPD, CAD, CABG 2011, defibualtar replacement, diabetes type 2, end-stage renal disease on peritoneal dialysis, hypertension, stroke, status post third toe amputation due to gangrene that failed conservative measures 3 weeks ago with subsequent dehiscence then underwent revision surgery 12 days ago. Today patient underwent transmetatarsal amputation per orthopedics. During his hospitalization his blood pressure has been labile as well as his glucose level. He developed a temperature  was treated for possible sepsis cultures were negative .He also developed sore throat was evaluated by Dr. Pollyann Kennedy who opined ulcers and oral cavity concerning for a herpes class virus versus possible reaction to medication/antibiotic. Nephrology is following for his peritoneal dialysis. [facility] asked to consult for altered mental status and uncontrollable diabetes.  Review of Systems:  Patient denies headache dizziness syncope or near-syncope. He denies chest pain palpitations shortness of breath cough. He denies abdominal pain nausea vomiting. He does report some loose stool as he has been given lactulose of late. Worsening lower extremity edema on the right. He denies recent fever chills  Past Medical History:  Diagnosis Date  . AICD (automatic cardioverter/defibrillator) present   . CHF (congestive heart failure) (HCC)   . COPD (chronic obstructive pulmonary disease) (HCC)   . Coronary artery disease    CABG 2011  . Diabetes mellitus without complication (HCC)   . ESRD on peritoneal dialysis [facility])    Started PD Dec 2016 in [place]. Nephrology is in [place].   [name] History of peritonitis    PD cath related peritonitis in April 2017  . Hypertension   . Peripheral vascular disease (HCC)    . Sjogren's syndrome (HCC)  Per pt's wife, was diagnosed in 2016 during w/u for cause of renal failure prior to starting dialysis.  He had a rash on his chest apparently prompting this w/u.  Never had renal bx.  He was referred to a rheum MD in [place] and per the wife he was treated with MTX and other medications but had side effects to "all of it" and isn't taking anything for this as of Jun 2017.   . Stroke [facility])    Past Surgical History:  Procedure Laterality Date  . ABDOMINAL AORTOGRAM W/LOWER EXTREMITY N/A 11/29/2016   Procedure: ABDOMINAL AORTOGRAM W/LOWER EXTREMITY;  Surgeon: Nada Libman, MD;  [facility];  Service: Cardiovascular;  Laterality: N/A;  . AMPUTATION Left 11/29/2016   Procedure: LEFT THIRD TOE AMPUTATION;  Surgeon: Nada Libman, MD;  Location: [facility] OR;  Service: Vascular;  Laterality: Left;  . ANGIOPLASTY  12/15/2016   Procedure: Peyton Bottoms;  Surgeon: Maeola Harman, MD;  Location: [facility] OR;  Service: Vascular;;  . cardiac catherization with stent placement  2017  . CORONARY ARTERY BYPASS GRAFT  2011  . FALSE ANEURYSM REPAIR Right 12/15/2016   Procedure: REPAIR FALSE ANEURYSM;  Surgeon: Maeola Harman, MD;  Location: [facility] OR;  Service: Vascular;  Laterality: Right;  . IRRIGATION AND DEBRIDEMENT FOOT Left 12/15/2016   Procedure: IRRIGATION AND DEBRIDEMENT FOOT;  Surgeon: Maeola Harman, MD;  Location: [facility] OR;  Service: Vascular;  Laterality: Left;  . LOWER EXTREMITY ANGIOGRAM Left 12/15/2016   Procedure: LOWER EXTREMITY ANGIOGRAM; PEDAL ACCESS;  Surgeon: Maeola Harman, MD;  [facility] OR;  Service: Vascular;  Laterality: Left;  . PERIPHERAL VASCULAR BALLOON ANGIOPLASTY  11/29/2016   Procedure: PERIPHERAL VASCULAR BALLOON ANGIOPLASTY;  Surgeon: Nada Libman, MD;  [facility];  Service: Cardiovascular;;  LT Peroneal AT attempted unsuccessful   Social History:  reports that he has quit  smoking. He has never used smokeless tobacco. He reports that he does not drink alcohol or use drugs. 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 Lisinopril Hives  . Methotrexate Derivatives Other (See Comments)    blisters  . Sulfa Antibiotics Hives  . Tape Hives  . Vancomycin Other (See Comments)    Blisters    Family History  Problem Relation Age of Onset  . Heart disease Father     Prior to Admission medications   Medication Sig Start Date End Date Taking? Authorizing Provider  amoxicillin (AMOXIL) 500 MG capsule Take 2,000 mg by mouth See admin instructions. 1 hour prior to dental procedures   Yes [provider]  aspirin EC 81 MG tablet Take 81 mg by mouth daily.   Yes [provider]  atorvastatin (LIPITOR) 80 MG tablet Take 80 mg by mouth daily at 6 PM.   Yes [provider]  calcium acetate (PHOSLO) 667 MG capsule Take 667 mg by mouth 3 (three) times daily with meals.   Yes [provider]  carvedilol (COREG) 12.5 MG tablet Take 12.5 mg by mouth 2 (two) times daily with a meal.   Yes [provider]  clopidogrel (PLAVIX) 75 MG tablet Take 75 mg by mouth daily.   Yes [provider]  docusate sodium (COLACE) 100 MG capsule Take 100 mg by mouth 2 (two) times daily.    Yes [provider]  furosemide (LASIX) 40 MG tablet Take 40 mg by mouth 2 (two) times daily.  Yes [provider]  insulin NPH Human (HUMULIN N,NOVOLIN N) 100 UNIT/ML injection Inject 70 Units into the skin at bedtime.   Yes [provider]  insulin regular (NOVOLIN R,HUMULIN R) 100 units/mL injection Inject 20 Units into the skin 3 (three) times daily before meals.   Yes [provider]  isosorbide mononitrate (IMDUR) 30 MG 24 hr tablet Take 30 mg by mouth daily.   Yes [provider]  losartan (COZAAR) 25 MG tablet Take 25 mg by mouth at bedtime.    Yes [provider]  nitroGLYCERIN (NITROSTAT) 0.4 MG SL tablet Place 0.4 mg under the tongue every 5 (five) minutes as needed for chest pain.   Yes [provider]  ondansetron (ZOFRAN) 4 MG tablet Take 4 mg by mouth every 6 (six) hours as needed for nausea or vomiting.   Yes [provider]  oxyCODONE-acetaminophen (PERCOCET/ROXICET) 5-325 MG tablet Take 1 tablet by mouth every 6 (six) hours as needed for severe pain. 11/30/16  Yes Raymond Gurney, PA-C  Vitamin D, Ergocalciferol, (DRISDOL) 50000 units CAPS capsule Take 50,000 Units by mouth every 30 (thirty) days.    Yes [provider]   Physical Exam: Blood pressure (!) 137/52, pulse 73, temperature 97.6 F (36.4 C), resp. rate 16, height  (1.753 m), weight 93 kg (205 lb), SpO2 100 %. Vitals:   12/23/16 1034 12/23/16 1050  BP: (!) 127/53 (!) 137/52  Pulse: 74 73  Resp: 15 16  Temp:    SpO2: 98% 100%     General:  Slightly pale with flushed cheeks somewhat drowsy but arouses to verbal stimuli  Eyes: Pupils equal round reactive to light EOMI  ENT: Ears clear nose without drainage oropharynx with some erythema and white exudate  Neck: Supple no JVD full range of motion  Cardiovascular: Regular rate and rhythm no murmur gallop or rub left foot with wound VAC intact  Respiratory: Normal effort respirations slightly shallow breath sounds somewhat distant hear no crackles no wheezes  Abdomen: Positive bowel sounds no guarding or rebounding soft  Skin: Left foot with wound VAC face slightly flushed otherwise no rashes or lesions  Musculoskeletal: Joints without swelling/erythema normal muscle tone  Psychiatric: Calm cooperative  Neurologic: Alert and oriented 4 bilateral grip 5 out of 5 speech clear facial symmetry follows commands  Labs on Admission:  Basic Metabolic Panel:  Recent Labs Lab 12/18/16 0403 12/19/16 0714 12/21/16 0905 12/23/16 0652  NA 132*  --  130* 132*  K 3.1*  --  3.0*  3.8  CL 94*  --  93* 96*  CO2 27  --  26 28  GLUCOSE 276* 469* 465* 232*  BUN 37*  --  26* 30*  CREATININE 6.36*  --  6.46* 6.99*  CALCIUM 7.9*  --  8.2* 8.1*   Liver Function Tests:  Recent Labs Lab 12/18/16 0403  AST 21  ALT 16*  ALKPHOS 60  BILITOT 0.4  PROT 5.2*  ALBUMIN 1.6*   No results for input(s): LIPASE, AMYLASE in the last 168 hours. No results for input(s): AMMONIA in the last 168 hours. CBC:  Recent Labs Lab 12/17/16 0206 12/18/16 0403 12/19/16 0310 12/20/16 0350 12/23/16 0652  WBC 20.6* 17.0* 10.7* 9.6 10.5  HGB 7.4* 7.1* 9.6* 9.6* 10.3*  HCT 22.2* 21.5* 28.5* 29.4* 31.6*  MCV 96.1 96.4 92.5 92.7 94.9  PLT 260 248 239 272 308   Cardiac Enzymes: No results for input(s): CKTOTAL, CKMB, CKMBINDEX, TROPONINI in  the last 168 hours. BNP: Invalid input(s): POCBNP CBG:  Recent Labs Lab 12/22/16 1511 12/22/16 2119 12/23/16 0621 12/23/16 0750 12/23/16 1030  GLUCAP 48* 122* 217* 211* 161*    Radiological Exams on Admission: No results found.  EKG:  Time spent: 60 minutes  [facility] M [facility]   If 7PM-7AM, please contact night-coverage [email or web address] Password [facility] 12/23/2016, 11:35 AM

## 2016-12-23 NOTE — Care Management Note (Signed)
Case Management Note Donn Pierini RN, BSN Unit 4E-Case Manager [phone]  Patient Details  Name: [name] MRN: [id number] Date of Birth: 10-21-1940  Subjective/Objective:   Pt admitted with PAD/gangrene of toe, s/p Open repair of right common femoral pseudo-aneurysm and toe amputation- pt may still need further amputation- currently with wound VAC- possible return to OR later this week.                 Action/Plan: PTA pt lived at home with spouse- hx of Peritoneal dialysis- CM to follow for d/c needs  Expected Discharge Date:                  Expected Discharge Plan:  Skilled Nursing Facility  In-House Referral:  Clinical Social Work  Discharge planning Services  CM Consult  Post Acute Care Choice:    Choice offered to:     DME Arranged:    DME Agency:     HH Arranged:    HH Agency:     Status of Service:  In process, will continue to follow  If discussed at Long Length of Stay Meetings, dates discussed:  9/11, 9/13  Discharge Disposition:   Additional Comments:  12/23/16- 1200- Donn Pierini RN, CM- pt s/p Left Transmetatarsal Amputation,, Application Prevena wound VAC today-  CM to continue to follow for d/c needs- PT/OT to re-eval post op for appropriate d/c plan- CIR vs SNF- CSW has been consulted for possible SNF placement  Zenda Alpers Lenn Sink, RN 12/23/2016, 12:08 PM

## 2016-12-23 NOTE — Anesthesia Procedure Notes (Signed)
Procedure Name: MAC Date/Time: 12/23/2016 9:44 AM Performed by: Barrington Ellison Pre-anesthesia Checklist: Patient identified, Emergency Drugs available, Suction available and Patient being monitored Oxygen Delivery Method: Simple face mask

## 2016-12-23 NOTE — Progress Notes (Signed)
Pharmacy consulted regarding pt 1000 meds that are not showing as due after MAR taken off hold.  Pharmacist states she will reschedule them so we can scan, but when back in room to administer, still not showing available.  RN scanned as override.

## 2016-12-23 NOTE — Op Note (Signed)
12/15/2016 - 12/23/2016  10:21 AM  PATIENT:  [name]    PRE-OPERATIVE DIAGNOSIS:  Gangrene Left Foot  POST-OPERATIVE DIAGNOSIS:  Same  PROCEDURE:  Left Transmetatarsal Amputation Application Prevena wound VAC  SURGEON:  Nadara Mustard, MD  PHYSICIAN ASSISTANT:None ANESTHESIA:   General  PREOPERATIVE INDICATIONS:  FINNEAN CERAMI is a  76 y.o. male with a diagnosis of Gangrene Left Foot who failed conservative measures and elected for surgical management.    The risks benefits and alternatives were discussed with the patient preoperatively including but not limited to the risks of infection, bleeding, nerve injury, cardiopulmonary complications, the need for revision surgery, among others, and the patient was willing to proceed.  OPERATIVE IMPLANTS: Prevena wound VAC  OPERATIVE FINDINGS: Minimal petechial bleeding.  OPERATIVE PROCEDURE: Patient was brought to the operating room and underwent a regional anesthesia. After adequate levels anesthesia were obtained patient's left lower extremity was prepped using DuraPrep draped into a sterile field a timeout was called. A fishmouth incision was made just proximal to the gangrenous necrotic wound. A transmetatarsal amputation was performed. Electrocautery was used for hemostasis. There is extensive amount of nonviable muscle which was removed. Tendons were removed. After irrigation electrocautery was used for hemostasis. The incision was closed using 2-0 nylon. A Prevena wound VAC was applied this had a good suction fit patient was taken to the PACU in stable condition.

## 2016-12-24 ENCOUNTER — Encounter (HOSPITAL_COMMUNITY): Payer: Self-pay | Admitting: Orthopedic Surgery

## 2016-12-24 LAB — GLUCOSE, CAPILLARY
GLUCOSE-CAPILLARY: 312 mg/dL — AB (ref 65–99)
Glucose-Capillary: 358 mg/dL — ABNORMAL HIGH (ref 65–99)
Glucose-Capillary: 319 mg/dL — ABNORMAL HIGH (ref 65–99)

## 2016-12-24 LAB — HEMOGLOBIN A1C
Hgb A1c MFr Bld: 8.8 % — ABNORMAL HIGH (ref 4.8–5.6)
MEAN PLASMA GLUCOSE: 205.86 mg/dL

## 2016-12-24 LAB — BODY FLUID CELL COUNT WITH DIFFERENTIAL: Total Nucleated Cell Count, Fluid: 3 cu mm (ref 0–1000)

## 2016-12-24 MED ORDER — DELFLEX-LC/2.5% DEXTROSE 394 MOSM/L IP SOLN: INTRAPERITONEAL | Status: DC

## 2016-12-24 MED ORDER — DELFLEX-LC/1.5% DEXTROSE 344 MOSM/L IP SOLN: INTRAPERITONEAL | Status: DC

## 2016-12-24 MED ORDER — INSULIN ASPART 100 UNIT/ML ~~LOC~~ SOLN
0.0000 [IU] | Freq: Three times a day (TID) | SUBCUTANEOUS | Status: DC
  Administered 2016-12-24: 9 [IU] via SUBCUTANEOUS
  Administered 2016-12-24: 3 [IU] via SUBCUTANEOUS
  Administered 2016-12-24: 7 [IU] via SUBCUTANEOUS
  Administered 2016-12-25 (×2): 9 [IU] via SUBCUTANEOUS
  Administered 2016-12-25: 5 [IU] via SUBCUTANEOUS
  Administered 2016-12-26: 3 [IU] via SUBCUTANEOUS
  Administered 2016-12-26: 9 [IU] via SUBCUTANEOUS
  Administered 2016-12-27: 5 [IU] via SUBCUTANEOUS
  Administered 2016-12-27: 7 [IU] via SUBCUTANEOUS

## 2016-12-24 NOTE — Progress Notes (Signed)
Subjective: Interval History: none.. Comfortable this morning  Objective: Vital signs in last 24 hours: Temp:  [97.6 F (36.4 C)-99.3 F (37.4 C)] 98.9 F (37.2 C) (09/15 0830) Pulse Rate:  [72-98] 95 (09/15 0830) Resp:  [16-26] 20 (09/15 0830) BP: (142-160)/(51-86) 148/59 (09/15 0830) SpO2:  [95 %-100 %] 95 % (09/15 0400) Weight:  [213 lb 3 oz (96.7 kg)] 213 lb 3 oz (96.7 kg) (09/15 0757)  Intake/Output from previous day: 09/14 0701 - 09/15 0700 In: 1304.2 [I.V.:1054.2; IV Piggyback:250] Out: 80 [Urine:50; Blood:30] Intake/Output this shift: Total I/O In: 16109 [Other:12495] Out: 12975 [Other:12975]  2+ dorsalis pedis pulse. VAC dressing in place.  Lab Results:  Recent Labs  12/23/16 0652  WBC 10.5  HGB 10.3*  HCT 31.6*  PLT 308   BMET  Recent Labs  12/23/16 0652  NA 132*  K 3.8  CL 96*  CO2 28  GLUCOSE 232*  BUN 30*  CREATININE 6.99*  CALCIUM 8.1*    Studies/Results: US Abdomen Limited  Result Date: 12/19/2016 CLINICAL DATA:  3-4 days of nausea with fever.  History of diabetes. EXAM: ULTRASOUND ABDOMEN LIMITED RIGHT UPPER QUADRANT COMPARISON:  None in PACs FINDINGS: Gallbladder: The gallbladder is adequately distended. There is an echogenic non mobile nonshadowing focus near the gallbladder neck compatible with a polyp measuring up 6 mm in diameter. There is no gallbladder wall thickening, pericholecystic fluid, or positive sonographic Murphy's sign. Common bile duct: Diameter: 4.5 mm. Liver: No focal lesion identified. Within normal limits in parenchymal echogenicity. Portal vein is patent on color Doppler imaging with normal direction of blood flow towards the liver. IMPRESSION: Probable 6 mm gallbladder polyp near the neck. No sonographic evidence of acute cholecystitis. Electronically Signed   By: Joshua  [name] M.D.   On: 12/19/2016 12:27   Dg Foot Complete Left  Result Date: 11/25/2016 CLINICAL DATA:  76 year old male with leaking pain of the foot.  EXAM: LEFT FOOT - COMPLETE 3+ VIEW COMPARISON:  None. FINDINGS: There is no evidence of acute fracture or dislocation. There is diffuse osteopenia and degenerative changes limiting the evaluation for subtle underlying bony abnormalities. Within these limitations, no focal osseous findings are identified. Soft tissues are grossly unremarkable. IMPRESSION: No focal osseous abnormalities in the setting of diffuse osteopenia and degenerative changes. Please note, plain-film radiography has limited sensitivity for the detection of [name] osteomyelitis. Electronically Signed   By: [name] M.D.   On: 11/25/2016 15:29   Anti-infectives: Anti-infectives    Start     Dose/Rate Route Frequency Ordered Stop   12/23/16 1200  ceFAZolin (ANCEF) IVPB 1 g/50 mL premix     1 g 100 mL/hr over 30 Minutes Intravenous Every 24 hr x 2 12/23/16 1024 12/25/16 1159   12/23/16 0600  ceFAZolin (ANCEF) IVPB 2g/100 mL premix     2 g 200 mL/hr over 30 Minutes Intravenous To Short Stay 12/22/16 1548 12/23/16 0946   12/18/16 0600  cefTAZidime (FORTAZ) 500 mg in dextrose 5 % 50 mL IVPB  Status:  Discontinued     500 mg 100 mL/hr over 30 Minutes Intravenous Every 24 hours 12/17/16 0343 12/24/16 1046   12/17/16 2000  linezolid (ZYVOX) IVPB 600 mg  Status:  Discontinued     600 mg 300 mL/hr over 60 Minutes Intravenous Every 12 hours 12/17/16 1353 12/22/16 1407   12/17/16 0500  linezolid (ZYVOX) IVPB 600 mg     600 mg 300 mL/hr over 60 Minutes Intravenous  Once 12/17/16 0400 12/17/16 0702  12/17/16 0400  cefTAZidime (FORTAZ) 1 g in dextrose 5 % 50 mL IVPB  Status:  Discontinued     1 g 100 mL/hr over 30 Minutes Intravenous Every 24 hours 12/17/16 0116 12/17/16 0343   12/17/16 0400  cefTAZidime (FORTAZ) 1 g in dextrose 5 % 50 mL IVPB     1 g 100 mL/hr over 30 Minutes Intravenous  Once 12/17/16 0343 12/17/16 0557   12/16/16 1200  cefUROXime (ZINACEF) 1.5 g in dextrose 5 % 50 mL IVPB     1.5 g 100 mL/hr over 30 Minutes  Intravenous  Once 12/15/16 1812 12/16/16 1428   12/15/16 1227  dextrose 5 % with cefUROXime (ZINACEF) ADS Med    Comments:  Forte, Lindsi   : cabinet override      12/15/16 1227 12/15/16 1325   12/15/16 1226  cefUROXime (ZINACEF) 1.5 g in dextrose 5 % 50 mL IVPB     1.5 g 100 mL/hr over 30 Minutes Intravenous 30 min pre-op 12/15/16 1226 12/15/16 1325      Assessment/Plan: s/p Procedure(s): Left Transmetatarsal Amputation (Left) Stable overall. Right groin without change. Mild hematoma present. Good flow to his foot. Continue to work with physical therapy for transfer   LOS: 9 days   [name] 12/24/2016, 11:14 AM

## 2016-12-24 NOTE — Progress Notes (Signed)
[facility] Progress Note   Assessment/ Plan:    Dialysis Orders: Center: [place] [facility]  on CCPD . 6 exchanges/day one mid-day exchange.  2.5 liters of 2.5% per CCPD and 2 liters of 2.5% for last fill and mid day drain.  1. 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  S/p L TMT 12/23/16. 2. ESRD- Will continue with CCPD, 1.5% and 2.5%  Eliminated mid-day exchange and added extra exchange. 3. Hypertension/volume- weights coming down, closer to EDW.  Watching Bps- they are up and down. 4. Anemia (ABLA)- Hgb 10.3, will resume ESA if needed 5. Nausea, GB US with 6 mm gallbladder polyp. 6. Fever/ possible sepsis- s/p linezolid/ ceftaz.  PD cultures negative so far.  Unfortunately no cell ct was sent at the time of fever- so that doesn't rule out the possibility of culture neg peritonitis which would have been proven/disproven with the cell ct.  A cell count from this AM revealed 3 WBCs in the whole sample- would be very strange to have a resolving PD peritonitis with only 3 cells in ct, so I'm stopping abx and following.  ? If there was a viral syndrome causing the mucositis.  Apprec ENT. 7. Constipation: miralax daily 8. Nutrition: liberalize diet to carb modified only 9. Hypokalemia: supplementing  Subjective:    S/p L TMT.  Feeling OK.  Family at bedside.   Objective:   BP (!) 148/59   Pulse 95   Temp 98.9 F (37.2 C)   Resp 20   Ht  (1.753 m)   Wt 96.7 kg (213 lb 3 oz)   SpO2 95%   BMI 31.48 kg/m   Physical Exam: GEN lying in bed,  NAD HEENT EOMI, PERRL.  Healing OP ulcers NECK no JVD PULM clear bilaterally CV RRR no m/r/g ABD distended, NABS EXT L LE s/p TMT with woundvac in place, trace LLE edema, no RLE edema.  R groin with some firmness (no frank hematoma) NEURO AAO x 3  Labs: BMET  Recent Labs Lab 12/18/16 0403 12/19/16 0714 12/21/16 0905 12/23/16 0652  NA 132*  --  130* 132*  K 3.1*  --   3.0* 3.8  CL 94*  --  93* 96*  CO2 27  --  26 28  GLUCOSE 276* 469* 465* 232*  BUN 37*  --  26* 30*  CREATININE 6.36*  --  6.46* 6.99*  CALCIUM 7.9*  --  8.2* 8.1*   CBC  Recent Labs Lab 12/18/16 0403 12/19/16 0310 12/20/16 0350 12/23/16 0652  WBC 17.0* 10.7* 9.6 10.5  HGB 7.1* 9.6* 9.6* 10.3*  HCT 21.5* 28.5* 29.4* 31.6*  MCV 96.4 92.5 92.7 94.9  PLT 248 239 272 308    @ Medications:    . aspirin EC  81 mg Oral Daily  . atorvastatin  80 mg Oral q1800  . calcium acetate  667 mg Oral TID WC  . carvedilol  12.5 mg Oral BID WC  . clopidogrel  75 mg Oral Daily  . docusate sodium  100 mg Oral BID  . furosemide  40 mg Oral BID  . gentamicin cream  1 application Topical Daily  . heparin  5,000 Units Subcutaneous Q8H  . insulin aspart  0-9 Units Subcutaneous TID WC  . insulin glargine  12 Units Subcutaneous Daily  . isosorbide mononitrate  30 mg Oral Daily  . losartan  25 mg Oral QHS  . mouth rinse  15 mL Mouth  Rinse BID  . pantoprazole  40 mg Oral Daily  . potassium chloride  20 mEq Oral BID  . triamcinolone   Mouth/Throat TID  . [START ON 01/08/2017] Vitamin D (Ergocalciferol)  50,000 Units Oral Q30 days     Bufford Buttner MD [facility] pgr [phone] 12/24/2016, 11:09 AM

## 2016-12-24 NOTE — Progress Notes (Signed)
Physical Therapy Treatment Patient Details Name: [name] MRN: [id number] DOB: 09-Mar-1941 Today's Date: 12/24/2016    History of Present Illness Pt is a 76 yo male s/p L third toe amputation 11/29/16 and subsequent dehiscence, revision surgery on 12/11/16. Pt underwent transmetatarsal amputation on 9/14 and was made NWB on lt. PMH significant for CHF, COPD, CAD, CABG 2011, defirillator placement, DM2, ESRD on peritoneal dialysis, HTN and stroke.      PT Comments    Pt reassessed after transmetatarsal amputation on 9/14. Pt is now NWB on LLE so ambulation no longer a reasonable goal until that changes. Will focus on w/c level activities and bed to chair transfer. Pt/wife verbalize understanding of goal to become independent at w/c level. Goals changed to reflect new weight bearing status. Continue to feel that pt can benefit from CIR.  Please order formal rehab consult.   Follow Up Recommendations  CIR     Equipment Recommendations  Wheelchair (measurements PT);Wheelchair cushion (measurements PT) (w/c with elevating leg rests, possibly sliding board)    Recommendations for Other Services Rehab consult     Precautions / Restrictions Precautions Precautions: Fall Required Braces or Orthoses: Other Brace/Splint Other Brace/Splint: post op shoe on Lt foot Restrictions Weight Bearing Restrictions: Yes LLE Weight Bearing: Non weight bearing LLE Partial Weight Bearing Percentage or Pounds:  (Darco shoe)    Mobility  Bed Mobility Overal bed mobility: Needs Assistance Bed Mobility: Supine to Sit     Supine to sit: Min assist;HOB elevated     General bed mobility comments: assist to elevate trunk into sitting. Use of rail  Transfers Overall transfer level: Needs assistance   Transfers: Lateral/Scoot Transfers          Lateral/Scoot Transfers: Mod assist General transfer comment: Assist to scoot hips laterally using bed pad. Pt able to maintain NWB. Scooted toward rt  into drop arm recliner  Ambulation/Gait                 Stairs            Wheelchair Mobility    Modified Rankin (Stroke Patients Only)       Balance Overall balance assessment: Needs assistance Sitting-balance support: No upper extremity supported;Feet supported Sitting balance-Leahy Scale: Good                                      Cognition Arousal/Alertness: Awake/alert Behavior During Therapy: WFL for tasks assessed/performed Overall Cognitive Status: Within Functional Limits for tasks assessed                                        Exercises      General Comments        Pertinent Vitals/Pain Pain Assessment: Faces Faces Pain Scale: Hurts little more Pain Location: LLE Pain Descriptors / Indicators: Grimacing;Throbbing Pain Intervention(s): Limited activity within patient's tolerance;Repositioned    Home Living                      Prior Function            PT Goals (current goals can now be found in the care plan section) Acute Rehab PT Goals PT Goal Formulation: With patient Time For Goal Achievement: 01/07/17 Potential to Achieve Goals: Good Additional Goals Additional Goal #1:  Pt will propel w/c 50' with min assist Progress towards PT goals: Goals downgraded-see care plan    Frequency    Min 3X/week      PT Plan Current plan remains appropriate    Co-evaluation              AM-PAC PT "6 Clicks" Daily Activity  Outcome Measure  Difficulty turning over in bed (including adjusting bedclothes, sheets and blankets)?: A Lot Difficulty moving from lying on back to sitting on the side of the bed? : Unable Difficulty sitting down on and standing up from a chair with arms (e.g., wheelchair, bedside commode, etc,.)?: Unable Help needed moving to and from a bed to chair (including a wheelchair)?: A Lot Help needed walking in hospital room?: Total Help needed climbing 3-5 steps with a  railing? : Total 6 Click Score: 8    End of Session   Activity Tolerance: Patient tolerated treatment well Patient left: with call bell/phone within reach;in chair;with chair alarm set;with family/visitor present Nurse Communication: Mobility status PT Visit Diagnosis: Unsteadiness on feet (R26.81);Other abnormalities of gait and mobility (R26.89);Muscle weakness (generalized) (M62.81);Difficulty in walking, not elsewhere classified (R26.2);Pain Pain - Right/Left: Left Pain - part of body: Ankle and joints of foot     Time: 1610-9604 PT Time Calculation (min) (ACUTE ONLY): 41 min  Charges:  $Therapeutic Activity: 8-22 mins                    G Codes:       [facility] PT [phone]    Angelina Ok [facility] 12/24/2016, 6:05 PM

## 2016-12-24 NOTE — Progress Notes (Signed)
Medical Consult PROGRESS NOTE    [name]  ZOX:[id number] DOB: [birth year] DOA: 12/15/2016 PCP: System, Pcp Not In   Brief Narrative: 76 year old male with history of COPD, CHF, coronary artery disease, CABG, type 2 diabetes, ESRD on peritoneal dialysis, hypertension, a stroke, left foot gangrene, peripheral artery disease status post left transmetatarsal amputation and currently has a wound VAC, consulted for the management of altered mental status and diabetes.  Assessment & Plan:   # Uncontrolled type 2 diabetes mellitus with hyperglycemia, with long-term current use of insulin (HCC): -patient's labile blood sugar table likely due to reduced GFR, being an PD and surgical stress. Blood sugar level is better controlled. Plan to continue Lantus 12 units and low-dose sliding scale. Patient had A1c of  18 on 11/26/2016. Discussed with the patient and his wife at bedside.  #Acute metabolic encephalopathy: Patient's mental status improved and around baseline. Continue to monitor.  #ESRD on peritoneal dialysis: Patient was on antibiotics for presumed peritonitis. Off antibiotics today as per nephrologist. Cultures negative.  # Peripheral artery disease, gangrene of left foot is status post amputation and has a wound VAC: Further management as per surgery team.  # History of coronary artery disease status post CABG: Patient has no chest pain. Denied any aspirin, Lipitor, Coreg, Plavix, Lasix, losartan  We will continue to follow up. Thank you very much for the consult.  DVT prophylaxis: Heparin subcutaneous Code Status: Full code Family Communication: Discussed with the patient's wife at bedside  Subjective: Seen and examined at bedside. Denied headache, dizziness, nausea vomiting chest pain or shortness of breath. Wife at bedside.  Objective: Vitals:   12/24/16 0003 12/24/16 0400 12/24/16 0757 12/24/16 0830  BP: (!) 160/86 (!) 159/67  (!) 148/59  Pulse: 87 98  95  Resp: 18 (!) 22   20  Temp:    98.9 F (37.2 C)  TempSrc:      SpO2: 100% 95%    Weight:   96.7 kg (213 lb 3 oz)   Height:        Intake/Output Summary (Last 24 hours) at 12/24/16 1141 Last data filed at 12/24/16 0830  Gross per 24 hour  Intake         13599.17 ml  Output            13025 ml  Net           574.17 ml   Filed Weights   12/23/16 0645 12/23/16 0810 12/24/16 0757  Weight: 92.7 kg (204 lb 5.9 oz) 93 kg (205 lb) 96.7 kg (213 lb 3 oz)    Examination:  General exam: Appears calm and comfortable  Respiratory system: Clear to auscultation. Respiratory effort normal. No wheezing or crackle Cardiovascular system: S1 & S2 heard, RRR.  No pedal edema. Gastrointestinal system: Abdomen is nondistended, soft and nontender. Normal bowel sounds heard. Central nervous system: Alert and oriented. No focal neurological deficits. Extremities: Left foot with dressing and wound VAC Psychiatry: Judgement and insight appear normal. Mood & affect appropriate.     Data Reviewed: I have personally reviewed following labs and imaging studies  CBC:  Recent Labs Lab 12/18/16 0403 12/19/16 0310 12/20/16 0350 12/23/16 0652  WBC 17.0* 10.7* 9.6 10.5  HGB 7.1* 9.6* 9.6* 10.3*  HCT 21.5* 28.5* 29.4* 31.6*  MCV 96.4 92.5 92.7 94.9  PLT 248 239 272 308   Basic Metabolic Panel:  Recent Labs Lab 12/18/16 0403 12/19/16 0714 12/21/16 0905 12/23/16 0652  NA 132*  --  130* 132*  K 3.1*  --  3.0* 3.8  CL 94*  --  93* 96*  CO2 27  --  26 28  GLUCOSE 276* 469* 465* 232*  BUN 37*  --  26* 30*  CREATININE 6.36*  --  6.46* 6.99*  CALCIUM 7.9*  --  8.2* 8.1*   GFR: Estimated Creatinine Clearance: 10.5 mL/min (A) (by C-G formula based on SCr of 6.99 mg/dL (H)). Liver Function Tests:  Recent Labs Lab 12/18/16 0403  AST 21  ALT 16*  ALKPHOS 60  BILITOT 0.4  PROT 5.2*  ALBUMIN 1.6*   No results for input(s): LIPASE, AMYLASE in the last 168 hours. No results for input(s): AMMONIA in the last  168 hours. Coagulation Profile: No results for input(s): INR, PROTIME in the last 168 hours. Cardiac Enzymes: No results for input(s): CKTOTAL, CKMB, CKMBINDEX, TROPONINI in the last 168 hours. BNP (last 3 results) No results for input(s): PROBNP in the last 8760 hours. HbA1C: No results for input(s): HGBA1C in the last 72 hours. CBG:  Recent Labs Lab 12/23/16 0750 12/23/16 1030 12/23/16 1204 12/23/16 1557 12/23/16 2108  GLUCAP 211* 161* 117* 113* 224*   Lipid Profile: No results for input(s): CHOL, HDL, LDLCALC, TRIG, CHOLHDL, LDLDIRECT in the last 72 hours. Thyroid Function Tests: No results for input(s): TSH, T4TOTAL, FREET4, T3FREE, THYROIDAB in the last 72 hours. Anemia Panel: No results for input(s): VITAMINB12, FOLATE, FERRITIN, TIBC, IRON, RETICCTPCT in the last 72 hours. Sepsis Labs: No results for input(s): PROCALCITON, LATICACIDVEN in the last 168 hours.  Recent Results (from the past 240 hour(s))  Culture, blood (single) w Reflex to ID Panel     Status: None   Collection Time: 12/17/16  2:06 AM  Result Value Ref Range Status   Specimen Description BLOOD RIGHT HAND  Final   Special Requests   Final    BOTTLES DRAWN AEROBIC AND ANAEROBIC Blood Culture adequate volume   Culture NO GROWTH 5 DAYS  Final   Report Status 12/22/2016 FINAL  Final  Body fluid culture     Status: None   Collection Time: 12/17/16  8:40 AM  Result Value Ref Range Status   Specimen Description PERITONEAL  Final   Special Requests NONE  Final   Gram Stain   Final    WBC PRESENT,BOTH PMN AND MONONUCLEAR NO ORGANISMS SEEN CYTOSPIN SMEAR    Culture NO GROWTH 3 DAYS  Final   Report Status 12/23/2016 FINAL  Final  Surgical PCR screen     Status: None   Collection Time: 12/23/16  5:12 AM  Result Value Ref Range Status   MRSA, PCR NEGATIVE NEGATIVE Final   Staphylococcus aureus NEGATIVE NEGATIVE Final    Comment: (NOTE) The Xpert SA Assay (FDA approved for NASAL specimens in patients  98 years of age and older), is one component of a comprehensive surveillance program. It is not intended to diagnose infection nor to guide or monitor treatment.          Radiology Studies: No results found.      Scheduled Meds: . aspirin EC  81 mg Oral Daily  . atorvastatin  80 mg Oral q1800  . calcium acetate  667 mg Oral TID WC  . carvedilol  12.5 mg Oral BID WC  . clopidogrel  75 mg Oral Daily  . docusate sodium  100 mg Oral BID  . furosemide  40 mg Oral BID  . gentamicin cream  1 application Topical Daily  . heparin  5,000 Units Subcutaneous Q8H  . insulin aspart  0-9 Units Subcutaneous TID WC  . insulin glargine  12 Units Subcutaneous Daily  . isosorbide mononitrate  30 mg Oral Daily  . losartan  25 mg Oral QHS  . mouth rinse  15 mL Mouth Rinse BID  . pantoprazole  40 mg Oral Daily  . potassium chloride  20 mEq Oral BID  . triamcinolone   Mouth/Throat TID  . [START ON 01/08/2017] Vitamin D (Ergocalciferol)  50,000 Units Oral Q30 days   Continuous Infusions: . sodium chloride 10 mL/hr at 12/23/16 0812  . sodium chloride 10 mL/hr at 12/24/16 0142  .  ceFAZolin (ANCEF) IV 1 g (12/24/16 1122)  . dialysis solution 1.5% low-MG/low-CA    . methocarbamol (ROBAXIN)  IV       LOS: 9 days    Dron Jaynie Collins, MD [facility] Pager [phone]  If 7PM-7AM, please contact night-coverage [email or web address] Password [facility] 12/24/2016, 11:41 AM

## 2016-12-25 LAB — GLUCOSE, CAPILLARY
GLUCOSE-CAPILLARY: 210 mg/dL — AB (ref 65–99)
Glucose-Capillary: 395 mg/dL — ABNORMAL HIGH (ref 65–99)
Glucose-Capillary: 353 mg/dL — ABNORMAL HIGH (ref 65–99)
Glucose-Capillary: 259 mg/dL — ABNORMAL HIGH (ref 65–99)
Glucose-Capillary: 213 mg/dL — ABNORMAL HIGH (ref 65–99)

## 2016-12-25 MED ORDER — INSULIN ASPART 100 UNIT/ML ~~LOC~~ SOLN
5.0000 [IU] | Freq: Three times a day (TID) | SUBCUTANEOUS | Status: DC
  Administered 2016-12-25 – 2016-12-26 (×3): 5 [IU] via SUBCUTANEOUS

## 2016-12-25 MED ORDER — INSULIN GLARGINE 100 UNIT/ML ~~LOC~~ SOLN
15.0000 [IU] | Freq: Every day | SUBCUTANEOUS | Status: DC
  Filled 2016-12-25: qty 0.15

## 2016-12-25 MED ORDER — POLYETHYLENE GLYCOL 3350 17 G PO PACK
17.0000 g | PACK | Freq: Every day | ORAL | Status: DC
  Administered 2016-12-25: 17 g via ORAL
  Filled 2016-12-25: qty 1

## 2016-12-25 MED ORDER — INSULIN GLARGINE 100 UNIT/ML ~~LOC~~ SOLN
16.0000 [IU] | Freq: Every day | SUBCUTANEOUS | Status: DC
  Administered 2016-12-25: 16 [IU] via SUBCUTANEOUS
  Filled 2016-12-25 (×2): qty 0.16

## 2016-12-25 MED ORDER — INSULIN ASPART 100 UNIT/ML ~~LOC~~ SOLN
3.0000 [IU] | Freq: Three times a day (TID) | SUBCUTANEOUS | Status: DC
  Administered 2016-12-25: 3 [IU] via SUBCUTANEOUS

## 2016-12-25 NOTE — Progress Notes (Signed)
Occupational Therapy Treatment Patient Details Name: [name] MRN: [id number] DOB: 10-14-1940 Today's Date: 12/25/2016    History of present illness Pt is a 76 yo male s/p L third toe amputation 11/29/16 and subsequent dehiscence, revision surgery on 12/11/16. Pt underwent transmetatarsal amputation on 9/14 and was made NWB on lt. PMH significant for CHF, COPD, CAD, CABG 2011, defirillator placement, DM2, ESRD on peritoneal dialysis, HTN and stroke.     OT comments  This 76 yo male admitted with above with recent new sx on 12/23/16 presents to acute OT today making progress with lateral transfers and tolerating level 1 theraband exercises seated in recliner. He will continue to benefit from acute OT with follow up OT on CIR to get to a Mod I level from [name].  Follow Up Recommendations  CIR;Supervision/Assistance - 24 hour    Equipment Recommendations  Other (comment) (defer to next venue)       Precautions / Restrictions Precautions Precautions: Fall Required Braces or Orthoses: Other Brace/Splint Other Brace/Splint: post op shoe on Lt foot Restrictions Weight Bearing Restrictions: Yes LLE Weight Bearing: Non weight bearing       Mobility Bed Mobility Overal bed mobility: Needs Assistance Bed Mobility: Supine to Sit     Supine to sit: Min assist;HOB elevated        Transfers Overall transfer level: Needs assistance Equipment used: None Transfers: Lateral/Scoot Transfers          Lateral/Scoot Transfers: Min assist General transfer comment: going to his right with minimal use of bed pad    Balance Overall balance assessment: Needs assistance Sitting-balance support: No upper extremity supported;Feet supported Sitting balance-Leahy Scale: Poor Sitting balance - Comments: with attempts to scoot forward to EOB pt with posterior lean                                   ADL either performed or assessed with clinical judgement   ADL Overall ADL's :  Needs assistance/impaired                         Toilet Transfer: Minimal assistance Toilet Transfer Details (indicate cue type and reason): lateral scoot from bed (slightly raised) to drop arm recliner                 Vision Baseline Vision/History: Wears glasses Wears Glasses: At all times Patient Visual Report: No change from baseline            Cognition Arousal/Alertness: Awake/alert Behavior During Therapy: WFL for tasks assessed/performed Overall Cognitive Status: Within Functional Limits for tasks assessed                                          Exercises Other Exercises Other Exercises: Pt completed 10 reps while seated in recliner of Level 1 theraband for shoulder diagonals and lateral movements alternating arms as well as 10 reps of midline chest pulls using Bil UEs simultaneously           Pertinent Vitals/ Pain       Pain Assessment: No/denies pain            Progress Toward Goals  OT Goals(current goals can now be found in the care plan section)  Progress towards OT goals: Progressing toward goals  Plan Discharge plan remains appropriate       AM-PAC PT "6 Clicks" Daily Activity     Outcome Measure   Help from another person eating meals?: None Help from another person taking care of personal grooming?: A Little Help from another person toileting, which includes using toliet, bedpan, or urinal?: A Lot Help from another person bathing (including washing, rinsing, drying)?: A Lot Help from another person to put on and taking off regular upper body clothing?: A Little Help from another person to put on and taking off regular lower body clothing?: A Lot 6 Click Score: 16    End of Session Equipment Utilized During Treatment:  (none)  OT Visit Diagnosis: Unsteadiness on feet (R26.81);Muscle weakness (generalized) (M62.81)   Activity Tolerance Patient tolerated treatment well   Patient Left in chair;with call  bell/phone within reach;with family/visitor present   Nurse Communication Mobility status (making sure they turn recliner around to do a lateral transfer back to good legs)        Time: 3664-4034 OT Time Calculation (min): 25 min  Charges: OT General Charges $OT Visit: 1 Visit OT Treatments $Self Care/Home Management : 8-22 mins $Therapeutic Exercise: 8-22 mins

## 2016-12-25 NOTE — Progress Notes (Addendum)
[facility] Progress Note   Assessment/ Plan:    Dialysis Orders: Center: [facility]  on [facility] . 6 exchanges/day one mid-day exchange.  2.5 liters of 2.5% per CCPD and 2 liters of 2.5% for last fill and mid day drain.  1. 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  S/p L TMT 12/23/16 with Dr. Lajoyce Corners. 2. ESRD- Will continue with CCPD, use all 2.5% tonight 9/16.  Eliminated mid-day exchange and added extra exchange. 3. Hypertension/volume- weights coming down, closer to EDW.  Watching Bps- they are up and down. 4. Anemia (ABLA)- Hgb 10.3, will resume ESA if needed 5. Nausea, GB US with 6 mm gallbladder polyp. 6. Fever/ possible sepsis- s/p linezolid/ ceftaz.  PD cultures negative so far.  Unfortunately no cell ct was sent at the time of fever- so that doesn't rule out the possibility of culture neg peritonitis which would have been proven/disproven with the cell ct.  A cell count 9/15 AM revealed 3 WBCs in the whole sample- would be very strange to have a resolving PD peritonitis with only 3 cells in ct, so I'm stopping abx and following.  He never had typical symptoms of PD peritonitis including cloudy effluent or abd pain. ? If there was a viral syndrome causing the mucositis.  Apprec ENT.  Will need a repeat PD cell ct and culture if fever recurs. 7. Constipation: miralax daily 8. Nutrition: liberalize diet to carb modified only 9. Hypokalemia: supplementing  Subjective:    Leg hurting.  Had some pain meds.     Objective:   BP (!) 174/74 (BP Location: Left Arm)   Pulse 89   Temp 99.6 F (37.6 C) (Axillary)   Resp 19   Ht  (1.753 m)   Wt 90.9 kg (200 lb 6.4 oz)   SpO2 97%   BMI 29.59 kg/m   Physical Exam: GEN lying in bed,  NAD and sleeping HEENT EOMI, PERRL.  Healing OP ulcers NECK no JVD PULM clear bilaterally CV RRR no m/r/g ABD distended, NABS, PD cath c/d/i EXT L LE s/p TMT with woundvac in place,  trace LLE edema, no RLE edema.  R groin with some firmness (no frank hematoma)  Labs: BMET  Recent Labs Lab 12/19/16 0714 12/21/16 0905 12/23/16 0652  NA  --  130* 132*  K  --  3.0* 3.8  CL  --  93* 96*  CO2  --  26 28  GLUCOSE 469* 465* 232*  BUN  --  26* 30*  CREATININE  --  6.46* 6.99*  CALCIUM  --  8.2* 8.1*   CBC  Recent Labs Lab 12/19/16 0310 12/20/16 0350 12/23/16 0652  WBC 10.7* 9.6 10.5  HGB 9.6* 9.6* 10.3*  HCT 28.5* 29.4* 31.6*  MCV 92.5 92.7 94.9  PLT 239 272 308    @ Medications:    . aspirin EC  81 mg Oral Daily  . atorvastatin  80 mg Oral q1800  . calcium acetate  667 mg Oral TID WC  . carvedilol  12.5 mg Oral BID WC  . clopidogrel  75 mg Oral Daily  . docusate sodium  100 mg Oral BID  . furosemide  40 mg Oral BID  . gentamicin cream  1 application Topical Daily  . heparin  5,000 Units Subcutaneous Q8H  . insulin aspart  0-9 Units Subcutaneous TID WC  . insulin aspart  5 Units Subcutaneous TID WC  . insulin glargine  16 Units Subcutaneous Daily  . isosorbide mononitrate  30 mg Oral Daily  . losartan  25 mg Oral QHS  . mouth rinse  15 mL Mouth Rinse BID  . pantoprazole  40 mg Oral Daily  . polyethylene glycol  17 g Oral Daily  . potassium chloride  20 mEq Oral BID  . triamcinolone   Mouth/Throat TID  . [START ON 01/08/2017] Vitamin D (Ergocalciferol)  50,000 Units Oral Q30 days     Bufford Buttner MD [facility] pgr [phone] 12/25/2016, 11:30 AM

## 2016-12-25 NOTE — Progress Notes (Signed)
Inpatient Rehabilitation  Per PT request, patient was screened by Amenah Tucci for appropriateness for an Inpatient Acute Rehab consult.  At this time we are recommending an Inpatient Rehab consult.  Please order if you are agreeable.    Lazette Estala, M.A., CCC/SLP Admission Coordinator  [facility]  Cell [phone]  

## 2016-12-25 NOTE — Progress Notes (Signed)
Medical Consult PROGRESS NOTE    [name]  QIO:[id number] DOB: 01/24/1941 DOA: 12/15/2016 PCP: System, Pcp Not In   Brief Narrative: 76 year old male with history of COPD, CHF, coronary artery disease, CABG, type 2 diabetes, ESRD on peritoneal dialysis, hypertension, a stroke, left foot gangrene, peripheral artery disease status post left transmetatarsal amputation and currently has a wound VAC, consulted for the management of altered mental status and diabetes.  Assessment & Plan:   # Uncontrolled type 2 diabetes mellitus with hyperglycemia, with long-term current use of insulin (HCC): -patient's labile blood sugar table likely due to reduced GFR, being an PD and surgical stress. -Blood sugar level elevated to more than 300s with A1c of 8.8. I increased the dose of Lantus to 16 units and added novolog injection 5 units 3 times a day with food. Continue sliding scale. Monitor blood sugar level. Discussed with the patient and his wife at bedside.   #Acute metabolic encephalopathy: Patient's mental status improved and around baseline. Continue to monitor.  #Hypertension: Blood pressure elevated may likely be contributed by volume and pain. Continue current cardiac medication. Monitor blood pressure.  #ESRD on peritoneal dialysis: Patient was on antibiotics for presumed peritonitis. Off antibiotics  as per nephrologist. Cultures negative.  # Peripheral artery disease, gangrene of left foot is status post amputation and has a wound VAC: Further management as per surgery team.  # History of coronary artery disease status post CABG: Patient has no chest pain. Continue any aspirin, Lipitor, Coreg, Plavix, Lasix, losartan  We will continue to follow up. Thank you very much for the consult.  DVT prophylaxis: Heparin subcutaneous Code Status: Full code Family Communication: Discussed with the patient's wife at bedside  Subjective: Seen and examined at bedside. Denied headache, dizziness,  nausea vomiting chest pain or shortness of breath. Objective: Vitals:   12/24/16 2200 12/25/16 0000 12/25/16 0400 12/25/16 0807  BP: (!) 154/69 (!) 147/70 (!) 147/53 (!) 174/74  Pulse: 93 91 89   Resp: Temp: (!) 97.3 F (36.3 C) 98 F (36.7 C) 99.6 F (37.6 C)   TempSrc: Oral Axillary Axillary   SpO2: 91% 97% 97%   Weight:      Height:        Intake/Output Summary (Last 24 hours) at 12/25/16 1018 Last data filed at 12/24/16 2213  Gross per 24 hour  Intake              100 ml  Output              100 ml  Net                0 ml   Filed Weights   12/23/16 0810 12/24/16 0757 12/24/16 1815  Weight: 93 kg (205 lb) 96.7 kg (213 lb 3 oz) 90.9 kg (200 lb 6.4 oz)    Examination:  General exam: Lying on bed comfortable, not in distress  Respiratory system: Clear bilateral. Respiratory effort normal. No wheezing or crackle Cardiovascular system: Regular rate rhythm S1-S2 normal.  No pedal edema. Gastrointestinal system: Abdomen is nondistended, soft and nontender. Normal bowel sounds heard. Central nervous system: Alert and oriented. No focal neurological deficits. Extremities: Left foot with dressing and wound VAC Psychiatry: Judgement and insight appear normal. Mood & affect appropriate.     Data Reviewed: I have personally reviewed following labs and imaging studies  CBC:  Recent Labs Lab 12/19/16 0310 12/20/16 0350 12/23/16 0652  WBC 10.7* 9.6 10.5  HGB 9.6* 9.6* 10.3*  HCT 28.5* 29.4* 31.6*  MCV 92.5 92.7 94.9  PLT 239 272 308   Basic Metabolic Panel:  Recent Labs Lab 12/19/16 0714 12/21/16 0905 12/23/16 0652  NA  --  130* 132*  K  --  3.0* 3.8  CL  --  93* 96*  CO2  --  26 28  GLUCOSE 469* 465* 232*  BUN  --  26* 30*  CREATININE  --  6.46* 6.99*  CALCIUM  --  8.2* 8.1*   GFR: Estimated Creatinine Clearance: 10.2 mL/min (A) (by C-G formula based on SCr of 6.99 mg/dL (H)). Liver Function Tests: No results for input(s): AST, ALT, ALKPHOS,  BILITOT, PROT, ALBUMIN in the last 168 hours. No results for input(s): LIPASE, AMYLASE in the last 168 hours. No results for input(s): AMMONIA in the last 168 hours. Coagulation Profile: No results for input(s): INR, PROTIME in the last 168 hours. Cardiac Enzymes: No results for input(s): CKTOTAL, CKMB, CKMBINDEX, TROPONINI in the last 168 hours. BNP (last 3 results) No results for input(s): PROBNP in the last 8760 hours. HbA1C:  Recent Labs  12/24/16 1159  HGBA1C 8.8*   CBG:  Recent Labs Lab 12/23/16 2108 12/24/16 0830 12/24/16 1157 12/24/16 2208 12/25/16 0611  GLUCAP 224* 358* 312* 319* 395*   Lipid Profile: No results for input(s): CHOL, HDL, LDLCALC, TRIG, CHOLHDL, LDLDIRECT in the last 72 hours. Thyroid Function Tests: No results for input(s): TSH, T4TOTAL, FREET4, T3FREE, THYROIDAB in the last 72 hours. Anemia Panel: No results for input(s): VITAMINB12, FOLATE, FERRITIN, TIBC, IRON, RETICCTPCT in the last 72 hours. Sepsis Labs: No results for input(s): PROCALCITON, LATICACIDVEN in the last 168 hours.  Recent Results (from the past 240 hour(s))  Culture, blood (single) w Reflex to ID Panel     Status: None   Collection Time: 12/17/16  2:06 AM  Result Value Ref Range Status   Specimen Description BLOOD RIGHT HAND  Final   Special Requests   Final    BOTTLES DRAWN AEROBIC AND ANAEROBIC Blood Culture adequate volume   Culture NO GROWTH 5 DAYS  Final   Report Status 12/22/2016 FINAL  Final  Body fluid culture     Status: None   Collection Time: 12/17/16  8:40 AM  Result Value Ref Range Status   Specimen Description PERITONEAL  Final   Special Requests NONE  Final   Gram Stain   Final    WBC PRESENT,BOTH PMN AND MONONUCLEAR NO ORGANISMS SEEN CYTOSPIN SMEAR    Culture NO GROWTH 3 DAYS  Final   Report Status 12/23/2016 FINAL  Final  Surgical PCR screen     Status: None   Collection Time: 12/23/16  5:12 AM  Result Value Ref Range Status   MRSA, PCR NEGATIVE  NEGATIVE Final   Staphylococcus aureus NEGATIVE NEGATIVE Final    Comment: (NOTE) The Xpert SA Assay (FDA approved for NASAL specimens in patients 63 years of age and older), is one component of a comprehensive surveillance program. It is not intended to diagnose infection nor to guide or monitor treatment.          Radiology Studies: No results found.      Scheduled Meds: . aspirin EC  81 mg Oral Daily  . atorvastatin  80 mg Oral q1800  . calcium acetate  667 mg Oral TID WC  . carvedilol  12.5 mg Oral BID WC  . clopidogrel  75 mg Oral Daily  . docusate sodium  100 mg  Oral BID  . furosemide  40 mg Oral BID  . gentamicin cream  1 application Topical Daily  . heparin  5,000 Units Subcutaneous Q8H  . insulin aspart  0-9 Units Subcutaneous TID WC  . insulin aspart  5 Units Subcutaneous TID WC  . insulin glargine  16 Units Subcutaneous Daily  . isosorbide mononitrate  30 mg Oral Daily  . losartan  25 mg Oral QHS  . mouth rinse  15 mL Mouth Rinse BID  . pantoprazole  40 mg Oral Daily  . potassium chloride  20 mEq Oral BID  . triamcinolone   Mouth/Throat TID  . [START ON 01/08/2017] Vitamin D (Ergocalciferol)  50,000 Units Oral Q30 days   Continuous Infusions: . sodium chloride 10 mL/hr at 12/23/16 0812  . sodium chloride 10 mL/hr at 12/24/16 0142  . dialysis solution 1.5% low-MG/low-CA    . dialysis solution 1.5% low-MG/low-CA    . dialysis solution 2.5% low-MG/low-CA    . methocarbamol (ROBAXIN)  IV       LOS: 10 days    Dron Jaynie Collins, MD [facility] Pager [phone]  If 7PM-7AM, please contact night-coverage [email or web address] Password [email or web address] 12/25/2016, 10:18 AM

## 2016-12-25 NOTE — Progress Notes (Deleted)
Subjective: Interval History: none.. More comfortable today. Concern regarding drainage from surgical incisions  Objective: Vital signs in last 24 hours: Temp:  [97.3 F (36.3 C)-99.6 F (37.6 C)] 97.5 F (36.4 C) (09/16 1158) Pulse Rate:  [85-93] 89 (09/16 0400) Resp:  [19-24] 19 (09/16 0400) BP: (117-174)/(49-74) 174/74 (09/16 0807) SpO2:  [91 %-97 %] 97 % (09/16 0400) Weight:  [200 lb 6.4 oz (90.9 kg)] 200 lb 6.4 oz (90.9 kg) (09/15 1815)  Intake/Output from previous day: 09/15 0701 - 09/16 0700 In: 40981 [P.O.:100] Out: 13075 [Urine:100] Intake/Output this shift: Total I/O In: 19147 [Other:17492] Out: 18283 [Other:18283]  VAC intact on lateral right calf. 2-3+ dorsalis pedis pulse  Lab Results:  Recent Labs  12/23/16 0652  WBC 10.5  HGB 10.3*  HCT 31.6*  PLT 308   BMET  Recent Labs  12/23/16 0652  NA 132*  K 3.8  CL 96*  CO2 28  GLUCOSE 232*  BUN 30*  CREATININE 6.99*  CALCIUM 8.1*    Studies/Results: US Abdomen Limited  Result Date: 12/19/2016 CLINICAL DATA:  3-4 days of nausea with fever.  History of diabetes. EXAM: ULTRASOUND ABDOMEN LIMITED RIGHT UPPER QUADRANT COMPARISON:  None in PACs FINDINGS: Gallbladder: The gallbladder is adequately distended. There is an echogenic non mobile nonshadowing focus near the gallbladder neck compatible with a polyp measuring up 6 mm in diameter. There is no gallbladder wall thickening, pericholecystic fluid, or positive sonographic Murphy's sign. Common bile duct: Diameter: 4.5 mm. Liver: No focal lesion identified. Within normal limits in parenchymal echogenicity. Portal vein is patent on color Doppler imaging with normal direction of blood flow towards the liver. IMPRESSION: Probable 6 mm gallbladder polyp near the neck. No sonographic evidence of acute cholecystitis. Electronically Signed   By: David  [place] M.D.   On: 12/19/2016 12:27   Dg Foot Complete Left  Result Date: 11/25/2016 CLINICAL DATA:  76 year old  male with leaking pain of the foot. EXAM: LEFT FOOT - COMPLETE 3+ VIEW COMPARISON:  None. FINDINGS: There is no evidence of acute fracture or dislocation. There is diffuse osteopenia and degenerative changes limiting the evaluation for subtle underlying bony abnormalities. Within these limitations, no focal osseous findings are identified. Soft tissues are grossly unremarkable. IMPRESSION: No focal osseous abnormalities in the setting of diffuse osteopenia and degenerative changes. Please note, plain-film radiography has limited sensitivity for the detection of  osteomyelitis. Electronically Signed   By: Sande Brothers M.D.   On: 11/25/2016 15:29   Anti-infectives: Anti-infectives    Start     Dose/Rate Route Frequency Ordered Stop   12/23/16 1200  ceFAZolin (ANCEF) IVPB 1 g/50 mL premix     1 g 100 mL/hr over 30 Minutes Intravenous Every 24 hr x 2 12/23/16 1024 12/24/16 1152   12/23/16 0600  ceFAZolin (ANCEF) IVPB 2g/100 mL premix     2 g 200 mL/hr over 30 Minutes Intravenous To Short Stay 12/22/16 1548 12/23/16 0946   12/18/16 0600  cefTAZidime (FORTAZ) 500 mg in dextrose 5 % 50 mL IVPB  Status:  Discontinued     500 mg 100 mL/hr over 30 Minutes Intravenous Every 24 hours 12/17/16 0343 12/24/16 1046   12/17/16 2000  linezolid (ZYVOX) IVPB 600 mg  Status:  Discontinued     600 mg 300 mL/hr over 60 Minutes Intravenous Every 12 hours 12/17/16 1353 12/22/16 1407   12/17/16 0500  linezolid (ZYVOX) IVPB 600 mg     600 mg 300 mL/hr over 60 Minutes Intravenous  Once  12/17/16 0400 12/17/16 0702   12/17/16 0400  cefTAZidime (FORTAZ) 1 g in dextrose 5 % 50 mL IVPB  Status:  Discontinued     1 g 100 mL/hr over 30 Minutes Intravenous Every 24 hours 12/17/16 0116 12/17/16 0343   12/17/16 0400  cefTAZidime (FORTAZ) 1 g in dextrose 5 % 50 mL IVPB     1 g 100 mL/hr over 30 Minutes Intravenous  Once 12/17/16 0343 12/17/16 0557   12/16/16 1200  cefUROXime (ZINACEF) 1.5 g in dextrose 5 % 50 mL IVPB      1.5 g 100 mL/hr over 30 Minutes Intravenous  Once 12/15/16 1812 12/16/16 1428   12/15/16 1227  dextrose 5 % with cefUROXime (ZINACEF) ADS Med    Comments:  Forte, Lindsi   : cabinet override      12/15/16 1227 12/15/16 1325   12/15/16 1226  cefUROXime (ZINACEF) 1.5 g in dextrose 5 % 50 mL IVPB     1.5 g 100 mL/hr over 30 Minutes Intravenous 30 min pre-op 12/15/16 1226 12/15/16 1325      Assessment/Plan: s/p Procedure(s): Left Transmetatarsal Amputation (Left) Continued stable. Bypass widely patent.   LOS: 10 days   EarlyTawanna Cooler 12/25/2016, 1:05 PM

## 2016-12-25 NOTE — Progress Notes (Signed)
Subjective: Interval History: none.. Reports still slow progress with physical therapy. Less discomfort  Objective: Vital signs in last 24 hours: Temp:  [97.3 F (36.3 C)-99.6 F (37.6 C)] 97.5 F (36.4 C) (09/16 1158) Pulse Rate:  [85-93] 89 (09/16 0400) Resp:  [19-24] 19 (09/16 0400) BP: (117-174)/(49-74) 174/74 (09/16 0807) SpO2:  [91 %-97 %] 97 % (09/16 0400) Weight:  [200 lb 6.4 oz (90.9 kg)] 200 lb 6.4 oz (90.9 kg) (09/15 1815)  Intake/Output from previous day: 09/15 0701 - 09/16 0700 In: 78295 [P.O.:100] Out: 13075 [Urine:100] Intake/Output this shift: Total I/O In: 62130 [Other:17492] Out: 18283 [Other:18283]  Right groin stable with mild swelling. Left dorsalis pedis pulse intact. Left transmit VAC dressing intact.  Lab Results:  Recent Labs  12/23/16 0652  WBC 10.5  HGB 10.3*  HCT 31.6*  PLT 308   BMET  Recent Labs  12/23/16 0652  NA 132*  K 3.8  CL 96*  CO2 28  GLUCOSE 232*  BUN 30*  CREATININE 6.99*  CALCIUM 8.1*    Studies/Results: US Abdomen Limited  Result Date: 12/19/2016 CLINICAL DATA:  3-4 days of nausea with fever.  History of diabetes. EXAM: ULTRASOUND ABDOMEN LIMITED RIGHT UPPER QUADRANT COMPARISON:  None in PACs FINDINGS: Gallbladder: The gallbladder is adequately distended. There is an echogenic non mobile nonshadowing focus near the gallbladder neck compatible with a polyp measuring up 6 mm in diameter. There is no gallbladder wall thickening, pericholecystic fluid, or positive sonographic Murphy's sign. Common bile duct: Diameter: 4.5 mm. Liver: No focal lesion identified. Within normal limits in parenchymal echogenicity. Portal vein is patent on color Doppler imaging with normal direction of blood flow towards the liver. IMPRESSION: Probable 6 mm gallbladder polyp near the neck. No sonographic evidence of acute cholecystitis. Electronically Signed   By: David  [place] M.D.   On: 12/19/2016 12:27   Dg Foot Complete Left  Result Date:  11/25/2016 CLINICAL DATA:  76 year old male with leaking pain of the foot. EXAM: LEFT FOOT - COMPLETE 3+ VIEW COMPARISON:  None. FINDINGS: There is no evidence of acute fracture or dislocation. There is diffuse osteopenia and degenerative changes limiting the evaluation for subtle underlying bony abnormalities. Within these limitations, no focal osseous findings are identified. Soft tissues are grossly unremarkable. IMPRESSION: No focal osseous abnormalities in the setting of diffuse osteopenia and degenerative changes. Please note, plain-film radiography has limited sensitivity for the detection of Estelle Skibicki osteomyelitis. Electronically Signed   By: Sande Brothers M.D.   On: 11/25/2016 15:29   Anti-infectives: Anti-infectives    Start     Dose/Rate Route Frequency Ordered Stop   12/23/16 1200  ceFAZolin (ANCEF) IVPB 1 g/50 mL premix     1 g 100 mL/hr over 30 Minutes Intravenous Every 24 hr x 2 12/23/16 1024 12/24/16 1152   12/23/16 0600  ceFAZolin (ANCEF) IVPB 2g/100 mL premix     2 g 200 mL/hr over 30 Minutes Intravenous To Short Stay 12/22/16 1548 12/23/16 0946   12/18/16 0600  cefTAZidime (FORTAZ) 500 mg in dextrose 5 % 50 mL IVPB  Status:  Discontinued     500 mg 100 mL/hr over 30 Minutes Intravenous Every 24 hours 12/17/16 0343 12/24/16 1046   12/17/16 2000  linezolid (ZYVOX) IVPB 600 mg  Status:  Discontinued     600 mg 300 mL/hr over 60 Minutes Intravenous Every 12 hours 12/17/16 1353 12/22/16 1407   12/17/16 0500  linezolid (ZYVOX) IVPB 600 mg     600 mg 300 mL/hr  over 60 Minutes Intravenous  Once 12/17/16 0400 12/17/16 0702   12/17/16 0400  cefTAZidime (FORTAZ) 1 g in dextrose 5 % 50 mL IVPB  Status:  Discontinued     1 g 100 mL/hr over 30 Minutes Intravenous Every 24 hours 12/17/16 0116 12/17/16 0343   12/17/16 0400  cefTAZidime (FORTAZ) 1 g in dextrose 5 % 50 mL IVPB     1 g 100 mL/hr over 30 Minutes Intravenous  Once 12/17/16 0343 12/17/16 0557   12/16/16 1200  cefUROXime  (ZINACEF) 1.5 g in dextrose 5 % 50 mL IVPB     1.5 g 100 mL/hr over 30 Minutes Intravenous  Once 12/15/16 1812 12/16/16 1428   12/15/16 1227  dextrose 5 % with cefUROXime (ZINACEF) ADS Med    Comments:  Forte, Lindsi   : cabinet override      12/15/16 1227 12/15/16 1325   12/15/16 1226  cefUROXime (ZINACEF) 1.5 g in dextrose 5 % 50 mL IVPB     1.5 g 100 mL/hr over 30 Minutes Intravenous 30 min pre-op 12/15/16 1226 12/15/16 1325      Assessment/Plan: s/p Procedure(s): Left Transmetatarsal Amputation (Left) Stable overall. Continue to mobilize   LOS: 10 days   [name] 12/25/2016, 1:07 PM

## 2016-12-26 DIAGNOSIS — R110 Nausea: Secondary | ICD-10-CM

## 2016-12-26 DIAGNOSIS — R509 Fever, unspecified: Secondary | ICD-10-CM

## 2016-12-26 DIAGNOSIS — I739 Peripheral vascular disease, unspecified: Secondary | ICD-10-CM

## 2016-12-26 DIAGNOSIS — I25709 Atherosclerosis of coronary artery bypass graft(s), unspecified, with unspecified angina pectoris: Secondary | ICD-10-CM

## 2016-12-26 DIAGNOSIS — N186 End stage renal disease: Secondary | ICD-10-CM

## 2016-12-26 DIAGNOSIS — D72829 Elevated white blood cell count, unspecified: Secondary | ICD-10-CM

## 2016-12-26 DIAGNOSIS — E1165 Type 2 diabetes mellitus with hyperglycemia: Secondary | ICD-10-CM

## 2016-12-26 DIAGNOSIS — Z794 Long term (current) use of insulin: Secondary | ICD-10-CM

## 2016-12-26 DIAGNOSIS — J449 Chronic obstructive pulmonary disease, unspecified: Secondary | ICD-10-CM

## 2016-12-26 DIAGNOSIS — R0682 Tachypnea, not elsewhere classified: Secondary | ICD-10-CM

## 2016-12-26 DIAGNOSIS — Z992 Dependence on renal dialysis: Secondary | ICD-10-CM

## 2016-12-26 LAB — GLUCOSE, CAPILLARY
GLUCOSE-CAPILLARY: 356 mg/dL — AB (ref 65–99)
GLUCOSE-CAPILLARY: 202 mg/dL — AB (ref 65–99)
GLUCOSE-CAPILLARY: 217 mg/dL — AB (ref 65–99)
Glucose-Capillary: 440 mg/dL — ABNORMAL HIGH (ref 65–99)

## 2016-12-26 LAB — PATHOLOGIST SMEAR REVIEW

## 2016-12-26 MED ORDER — INSULIN ASPART 100 UNIT/ML ~~LOC~~ SOLN
10.0000 [IU] | Freq: Once | SUBCUTANEOUS | Status: AC
  Administered 2016-12-26: 10 [IU] via SUBCUTANEOUS

## 2016-12-26 MED ORDER — INSULIN ASPART 100 UNIT/ML ~~LOC~~ SOLN
8.0000 [IU] | Freq: Three times a day (TID) | SUBCUTANEOUS | Status: DC
  Administered 2016-12-26 – 2016-12-27 (×3): 8 [IU] via SUBCUTANEOUS

## 2016-12-26 MED ORDER — LACTULOSE 10 GM/15ML PO SOLN
30.0000 g | Freq: Two times a day (BID) | ORAL | Status: DC
  Administered 2016-12-26 (×2): 30 g via ORAL
  Filled 2016-12-26 (×2): qty 45

## 2016-12-26 MED ORDER — INSULIN GLARGINE 100 UNIT/ML ~~LOC~~ SOLN
25.0000 [IU] | Freq: Every day | SUBCUTANEOUS | Status: DC
  Administered 2016-12-26: 25 [IU] via SUBCUTANEOUS
  Filled 2016-12-26: qty 0.25

## 2016-12-26 MED ORDER — INSULIN GLARGINE 100 UNIT/ML ~~LOC~~ SOLN
20.0000 [IU] | Freq: Two times a day (BID) | SUBCUTANEOUS | Status: DC
  Administered 2016-12-26: 20 [IU] via SUBCUTANEOUS
  Filled 2016-12-26 (×2): qty 0.2

## 2016-12-26 NOTE — Care Management Important Message (Signed)
Important Message  Patient Details  Name: [name] MRN: [id number] Date of Birth: 1940-04-13   Medicare Important Message Given:  Yes    Kyla Balzarine 12/26/2016, 11:17 AM

## 2016-12-26 NOTE — Progress Notes (Signed)
  [facility] Progress Note   Assessment/ Plan:    Dialysis Orders: Center: [facility]  on [facility] . 6 exchanges/day one mid-day exchange (occurs aroudn 9am).  2.5 liters of 2.5% per [facility] and 2 liters of 2.5% for last fill and mid day drain.  1. 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  S/p L TMT 12/23/16 with Dr. Lajoyce Corners. 2. ESRD- Will continue with [facility], use all 2.5% tonight 9/16.  Eliminated mid-day exchange and added extra exchange. 3. Hypertension/volume- BP stable 4. Anemia (ABLA)- Hgb  At goal, monitor will resume ESA if needed 5. Nausea, GB US with 6 mm gallbladder polyp. 6. Fever/ possible sepsis- Not conclusive pt ever with peritonitis, off, ABX, no abd pain / cloudy effluent; follow for now 7. Constipation: miralax daily 8. Hypokalemia: supplementing  Subjective:    Daily UF around 0.5-1L, Weights labile; c/o long time on PD machine   Objective:   BP (!) 104/56 (BP Location: Right Arm)   Pulse 92   Temp (!) 97.4 F (36.3 C) (Oral)   Resp 20   Ht  (1.753 m)   Wt 94.1 kg (207 lb 7.3 oz)   SpO2 96%   BMI 30.64 kg/m   Physical Exam: GEN lying in bed,  NAD and sleeping HEENT EOMI, PERRL.  Healing OP ulcers NECK no JVD PULM clear bilaterally CV RRR no m/r/g ABD distended, NABS, PD cath c/d/i EXT L LE s/p TMT with woundvac in place, trace LLE edema, no RLE edema.  R groin with some firmness (no frank hematoma)  Labs: BMET  Recent Labs Lab 12/21/16 0905 12/23/16 0652  NA 130* 132*  K 3.0* 3.8  CL 93* 96*  CO2 26 28  GLUCOSE 465* 232*  BUN 26* 30*  CREATININE 6.46* 6.99*  CALCIUM 8.2* 8.1*   CBC  Recent Labs Lab 12/20/16 0350 12/23/16 0652  WBC 9.6 10.5  HGB 9.6* 10.3*  HCT 29.4* 31.6*  MCV 92.7 94.9  PLT 272 308    @ Medications:    . aspirin EC  81 mg Oral Daily  . atorvastatin  80 mg Oral q1800  . calcium acetate  667 mg Oral TID WC  . carvedilol  12.5 mg  Oral BID WC  . clopidogrel  75 mg Oral Daily  . docusate sodium  100 mg Oral BID  . furosemide  40 mg Oral BID  . gentamicin cream  1 application Topical Daily  . heparin  5,000 Units Subcutaneous Q8H  . insulin aspart  0-9 Units Subcutaneous TID WC  . insulin aspart  5 Units Subcutaneous TID WC  . insulin glargine  25 Units Subcutaneous Daily  . isosorbide mononitrate  30 mg Oral Daily  . lactulose  30 g Oral BID  . losartan  25 mg Oral QHS  . mouth rinse  15 mL Mouth Rinse BID  . pantoprazole  40 mg Oral Daily  . polyethylene glycol  17 g Oral Daily  . potassium chloride  20 mEq Oral BID  . triamcinolone   Mouth/Throat TID  . [START ON 01/08/2017] Vitamin D (Ergocalciferol)  50,000 Units Oral Q30 days     [facility] 12/26/2016, 11:38 AM

## 2016-12-26 NOTE — Progress Notes (Signed)
Physical Therapy Treatment Patient Details Name: [name] MRN: [id number] DOB: [birth year] Today's Date: 12/26/2016    History of Present Illness Pt is a 76 yo male s/p L third toe amputation 11/29/16 and subsequent dehiscence, revision surgery on 12/11/16. Pt underwent transmetatarsal amputation on 9/14 and was made NWB on lt. PMH significant for CHF, COPD, CAD, CABG 2011, defirillator placement, DM2, ESRD on peritoneal dialysis, HTN and stroke.      PT Comments    Pt making steady progress. Remains motivated to progress mobility. Continue to feel pt can benefit from CIR.   Follow Up Recommendations  CIR     Equipment Recommendations  Wheelchair (measurements PT);Wheelchair cushion (measurements PT) (w/c with elevating leg rests, possibly sliding board)    Recommendations for Other Services Rehab consult     Precautions / Restrictions Precautions Precautions: Fall Required Braces or Orthoses: Other Brace/Splint Other Brace/Splint: post op shoe on Lt foot Restrictions Weight Bearing Restrictions: Yes LLE Weight Bearing: Non weight bearing    Mobility  Bed Mobility Overal bed mobility: Needs Assistance Bed Mobility: Supine to Sit;Sit to Supine     Supine to sit: Min assist;HOB elevated Sit to supine: Min assist   General bed mobility comments: Assist to elevate trunk into sitting. Assist to bring legs back up in be on return to supine.  Transfers Overall transfer level: Needs assistance Equipment used: Rolling walker (2 wheeled);Ambulation equipment used Transfers: Sit to/from Stand Sit to Stand: Mod assist;From elevated surface (with Stedy and max assist to keep LLE NWB)         General transfer comment: Attempted to stand from bed and unable to stand and maintain NWB. With Stedy pt able to reach standing from elevated bed and mod assist to bring hips up and max assist to keep weight off of LLE.  Ambulation/Gait                 Stairs             Wheelchair Mobility    Modified Rankin (Stroke Patients Only)       Balance Overall balance assessment: Needs assistance Sitting-balance support: No upper extremity supported;Feet supported Sitting balance-Leahy Scale: Good     Standing balance support: Bilateral upper extremity supported Standing balance-Leahy Scale: Poor Standing balance comment: Mod assist to maintain standing in Stedy x 30 sec with verbal cues and assist to maintain NWB                            Cognition Arousal/Alertness: Awake/alert Behavior During Therapy: WFL for tasks assessed/performed Overall Cognitive Status: Within Functional Limits for tasks assessed                                        Exercises      General Comments        Pertinent Vitals/Pain Pain Assessment: No/denies pain    Home Living                      Prior Function            PT Goals (current goals can now be found in the care plan section) Progress towards PT goals: Progressing toward goals    Frequency    Min 3X/week      PT Plan Current plan remains appropriate  Co-evaluation              AM-PAC PT "6 Clicks" Daily Activity  Outcome Measure  Difficulty turning over in bed (including adjusting bedclothes, sheets and blankets)?: A Lot Difficulty moving from lying on back to sitting on the side of the bed? : Unable Difficulty sitting down on and standing up from a chair with arms (e.g., wheelchair, bedside commode, etc,.)?: Unable Help needed moving to and from a bed to chair (including a wheelchair)?: A Lot Help needed walking in hospital room?: Total Help needed climbing 3-5 steps with a railing? : Total 6 Click Score: 8    End of Session Equipment Utilized During Treatment: Gait belt Activity Tolerance: Patient tolerated treatment well Patient left: with call bell/phone within reach;with family/visitor present;in bed   PT Visit Diagnosis: Other  abnormalities of gait and mobility (R26.89);Muscle weakness (generalized) (M62.81);Difficulty in walking, not elsewhere classified (R26.2)     Time: 5427-0623 PT Time Calculation (min) (ACUTE ONLY): 19 min  Charges:  $Therapeutic Activity: 8-22 mins                    G Codes:       [facility] PT [phone]    Angelina Ok [facility] 12/26/2016, 4:32 PM

## 2016-12-26 NOTE — Consult Note (Signed)
Physical Medicine and Rehabilitation Consult   Reason for Consult:  L-transmetatarsal amputation with debility due to multiple recent medical issues.  Referring Physician: Dr. Randie Heinz.    HPI: [name] is a 76 y.o. male with history of COPD, T2DM, CAD s/p ICD, ESRD- on PD,  PAD with critical limb ischemia with non-healing LLE wound who was admitted for right femoral pseudoaneurysm repair and resection of Left 3rd MT head on 12/15/16 by Dr. Randie Heinz. He failed attempts at limb salvage and hospital course complicated by N/V, leucocytosis and fever likely due to sepsis. GB ultrasound revealed GB polyp without acute cholecystitis. He has had issues with right groin wound dehiscence of wound with gangrenous changes left foot. He  required left transmetatarsal amputation with VAC placement by Dr. Lajoyce Corners on 9/14. Post op NWB LLE and antibiotics discontinued.   Dr. Pollyann Kennedy consulted for input on sore throat, difficulty swallowing and oral pharyngeal ulcerations. He recommended supportive care for viral v/s antibiotics related ulcers. Right groin with small hematoma and decrease in erythema per reports. CIR recommended due to deficits in mobility and decreased ability to carry out ADL tasks.    Review of Systems  Constitutional: Negative for chills and fever.  HENT: Negative for hearing loss and tinnitus.   Eyes: Negative for blurred vision and double vision.  Respiratory: Negative for cough, sputum production and shortness of breath.   Cardiovascular: Negative for chest pain and palpitations.  Gastrointestinal: Positive for constipation (no BM since last thurs). Negative for abdominal pain, heartburn and nausea.  Musculoskeletal: Positive for back pain, joint pain and myalgias. Negative for neck pain.  Skin: Negative for rash.  Neurological: Positive for sensory change (BLE). Negative for dizziness and headaches.  Psychiatric/Behavioral: The patient does not have insomnia.   All other systems  reviewed and are negative.     Past Medical History:  Diagnosis Date  . AICD (automatic cardioverter/defibrillator) present   . CHF (congestive heart failure) (HCC)   . COPD (chronic obstructive pulmonary disease) (HCC)   . Coronary artery disease    CABG 2011  . Diabetes mellitus without complication (HCC)   . ESRD on peritoneal dialysis [facility])    Started PD Dec 2016 in [place]. Nephrology is in [place].   [name] History of peritonitis    PD cath related peritonitis in April 2017  . Hypertension   . Peripheral vascular disease (HCC)   . Sjogren's syndrome (HCC)    Per pt's wife, was diagnosed in 2016 during w/u for cause of renal failure prior to starting dialysis.  He had a rash on his chest apparently prompting this w/u.  Never had renal bx.  He was referred to a rheum MD in [place] and per the wife he was treated with MTX and other medications but had side effects to "all of it" and isn't taking anything for this as of Jun 2017.   . Stroke [facility])     Past Surgical History:  Procedure Laterality Date  . ABDOMINAL AORTOGRAM W/LOWER EXTREMITY N/A 11/29/2016   Procedure: ABDOMINAL AORTOGRAM W/LOWER EXTREMITY;  Surgeon: Nada Libman, MD;  Location: [facility] INVASIVE CV LAB;  Service: Cardiovascular;  Laterality: N/A;  . AMPUTATION Left 11/29/2016   Procedure: LEFT THIRD TOE AMPUTATION;  Surgeon: Nada Libman, MD;  Location: [facility] OR;  Service: Vascular;  Laterality: Left;  . AMPUTATION Left 12/23/2016   Procedure: Left Transmetatarsal Amputation;  Surgeon: Nadara Mustard, MD;  Location: [facility] OR;  Service: Orthopedics;  Laterality: Left;  . ANGIOPLASTY  12/15/2016   Procedure: BALLOON ANGIOPLASTY;  Surgeon: Maeola Harman, MD;  Location: [facility] OR;  Service: Vascular;;  . cardiac catherization with stent placement  2017  . CORONARY ARTERY BYPASS GRAFT  2011  . FALSE ANEURYSM REPAIR Right 12/15/2016   Procedure: REPAIR FALSE ANEURYSM;  Surgeon: Maeola Harman, MD;  Location:  [facility] OR;  Service: Vascular;  Laterality: Right;  . IRRIGATION AND DEBRIDEMENT FOOT Left 12/15/2016   Procedure: IRRIGATION AND DEBRIDEMENT FOOT;  Surgeon: Maeola Harman, MD;  Location: [facility] OR;  Service: Vascular;  Laterality: Left;  . LOWER EXTREMITY ANGIOGRAM Left 12/15/2016   Procedure: LOWER EXTREMITY ANGIOGRAM; PEDAL ACCESS;  Surgeon: Maeola Harman, MD;  Location: [facility] OR;  Service: Vascular;  Laterality: Left;  . PERIPHERAL VASCULAR BALLOON ANGIOPLASTY  11/29/2016   Procedure: PERIPHERAL VASCULAR BALLOON ANGIOPLASTY;  Surgeon: Nada Libman, MD;  Location: [facility] INVASIVE CV LAB;  Service: Cardiovascular;;  LT Peroneal AT attempted unsuccessful    Family History  Problem Relation Age of Onset  . Heart disease Father     Social History:  Married. Retired Curator. Wife supportive and can provide assistance after discharge. He  reports that he has quit smoking 25 years ago. He has never used smokeless tobacco. He reports that he does not drink alcohol or use drugs.    Allergies  Allergen Reactions  . Lisinopril Hives  . Methotrexate Derivatives Other (See Comments)    blisters  . Sulfa Antibiotics Hives  . Tape Hives  . Vancomycin Other (See Comments)    Blisters     Medications Prior to Admission  Medication Sig Dispense Refill  . amoxicillin (AMOXIL) 500 MG capsule Take 2,000 mg by mouth See admin instructions. 1 hour prior to dental procedures    . aspirin EC 81 MG tablet Take 81 mg by mouth daily.    [name] atorvastatin (LIPITOR) 80 MG tablet Take 80 mg by mouth daily at 6 PM.    . calcium acetate (PHOSLO) 667 MG capsule Take 667 mg by mouth 3 (three) times daily with meals.    . carvedilol (COREG) 12.5 MG tablet Take 12.5 mg by mouth 2 (two) times daily with a meal.    . clopidogrel (PLAVIX) 75 MG tablet Take 75 mg by mouth daily.    [name] docusate sodium (COLACE) 100 MG capsule Take 100 mg by mouth 2 (two) times daily.     . furosemide (LASIX) 40 MG tablet Take 40  mg by mouth 2 (two) times daily.    . insulin NPH Human (HUMULIN N,NOVOLIN N) 100 UNIT/ML injection Inject 70 Units into the skin at bedtime.    . insulin regular (NOVOLIN R,HUMULIN R) 100 units/mL injection Inject 20 Units into the skin 3 (three) times daily before meals.    . isosorbide mononitrate (IMDUR) 30 MG 24 hr tablet Take 30 mg by mouth daily.    [name] losartan (COZAAR) 25 MG tablet Take 25 mg by mouth at bedtime.     . nitroGLYCERIN (NITROSTAT) 0.4 MG SL tablet Place 0.4 mg under the tongue every 5 (five) minutes as needed for chest pain.    [name] ondansetron (ZOFRAN) 4 MG tablet Take 4 mg by mouth every 6 (six) hours as needed for nausea or vomiting.    [name] oxyCODONE-acetaminophen (PERCOCET/ROXICET) 5-325 MG tablet Take 1 tablet by mouth every 6 (six) hours as needed for severe pain. 20 tablet 0  . Vitamin D, Ergocalciferol, (DRISDOL) 50000 units CAPS  capsule Take 50,000 Units by mouth every 30 (thirty) days.       Home: Home Living Family/patient expects to be discharged to:: Private residence Living Arrangements: Spouse/significant other Available Help at Discharge: Family, Available 24 hours/day Type of Home: Mobile home Home Access: Stairs to enter [employer] of Steps: 1 Entrance Stairs-Rails: Left Home Layout: One level Bathroom Shower/Tub: Psychologist, counselling, Sport and exercise psychologist: Standard Home Equipment: Environmental consultant - 4 wheels, Medical laboratory scientific officer - quad, Wheelchair - manual  Functional History: Prior Function Level of Independence: Needs assistance Gait / Transfers Assistance Needed: uses [name], RW ADL's / Homemaking Assistance Needed: A for socks, can bath self in shower Functional Status:  Mobility: Bed Mobility Overal bed mobility: Needs Assistance Bed Mobility: Supine to Sit Supine to sit: Min assist, HOB elevated Sit to supine: Min assist General bed mobility comments: assist to elevate trunk into sitting. Use of rail Transfers Overall transfer level: Needs  assistance Equipment used: None Transfers: Lateral/Scoot Transfers Sit to Stand: +2 safety/equipment, Min assist Stand pivot transfers: Min assist, From elevated surface  Lateral/Scoot Transfers: Min assist General transfer comment: going to his right with minimal use of bed pad Ambulation/Gait Ambulation/Gait assistance: +2 safety/equipment, Min assist Ambulation Distance (Feet): 40 Feet Assistive device: Rolling walker (2 wheeled) Gait Pattern/deviations: Step-to pattern, Decreased stance time - left, Decreased step length - right, Decreased weight shift to left, Trunk flexed General Gait Details: cues for posture and proximity of RW to aid in limiting weight bearing on L LE; pt with less difficulty maintaining weight mostly on L heel with tennis shoe donned on R Gait velocity: decreased    ADL: ADL Overall ADL's : Needs assistance/impaired Eating/Feeding: Sitting, Independent Eating/Feeding Details (indicate cue type and reason): Pt can independently feed himself but was having his wife feed him on OT arrival.  Grooming: Set up, Sitting Grooming Details (indicate cue type and reason): Becomes winded when completing seated ADL tasks.  Upper Body Bathing: Supervision/ safety, Sitting Upper Body Bathing Details (indicate cue type and reason): Fatigues easily Lower Body Bathing: Maximal assistance Lower Body Bathing Details (indicate cue type and reason): min A +2 sit<>stand Upper Body Dressing : Set up, Sitting Lower Body Dressing: Maximal assistance, Sit to/from stand Lower Body Dressing Details (indicate cue type and reason): Able to assist therapist to doff [place]co shoe Toilet Transfer: Minimal assistance Toilet Transfer Details (indicate cue type and reason): lateral scoot from bed (slightly raised) to drop arm recliner Toileting- Clothing Manipulation and Hygiene: Moderate assistance Toileting - Clothing Manipulation Details (indicate cue type and reason): Min A +2  sit<>stand General ADL Comments: session focused on HEP for BUE in preparation for increased dependence on UE for support during mobility  Cognition: Cognition Overall Cognitive Status: Within Functional Limits for tasks assessed Orientation Level: Oriented X4 Cognition Arousal/Alertness: Awake/alert Behavior During Therapy: WFL for tasks assessed/performed Overall Cognitive Status: Within Functional Limits for tasks assessed  Blood pressure (!) 104/56, pulse 92, temperature (!) 97.4 F (36.3 C), temperature source Oral, resp. rate 20, height  (1.753 m), weight 94.1 kg (207 lb 7.3 oz), SpO2 96 %. Physical Exam  Nursing note and vitals reviewed. Constitutional: He is oriented to person, place, and time. He appears well-developed and well-nourished.  HENT:  Head: Normocephalic and atraumatic.  Mouth/Throat: Oropharynx is clear and moist.  Eyes: Pupils are equal, round, and reactive to light. Conjunctivae and EOM are normal. Scleral icterus is present.  Neck: Normal range of motion. Neck supple.  Cardiovascular: Normal rate and regular  rhythm.   Murmur heard. Respiratory: Effort normal and breath sounds normal. No stridor. No respiratory distress. He has no wheezes.  GI: Soft. Bowel sounds are normal. He exhibits no distension. There is no tenderness.  Musculoskeletal: He exhibits edema and tenderness.  Min edema BLE.  Right groin with dry dressing. Left foot transmetatarsal site with wound VAC in place.   Neurological: He is alert and oriented to person, place, and time.  Speech clear.  Flat affect and answers questions without difficulty.   Motor: 4+/5 throughout, except left ankle (pain inhibition) Sensation diminished to light touch left ankle  Skin: Skin is warm and dry.  (See H&P)  Psychiatric: He has a normal mood and affect. His behavior is normal. Judgment and thought content normal.    Results for orders placed or performed during the hospital encounter of 12/15/16  (from the past 24 hour(s))  Glucose, capillary     Status: Abnormal   Collection Time: 12/25/16 12:01 PM  Result Value Ref Range   Glucose-Capillary 353 (H) 65 - 99 mg/dL   Comment 1 Notify RN   Glucose, capillary     Status: Abnormal   Collection Time: 12/25/16  3:59 PM  Result Value Ref Range   Glucose-Capillary 259 (H) 65 - 99 mg/dL   Comment 1 Notify RN    Comment 2 Document in Chart   Glucose, capillary     Status: Abnormal   Collection Time: 12/25/16  9:14 PM  Result Value Ref Range   Glucose-Capillary 213 (H) 65 - 99 mg/dL   Comment 1 Document in Chart   Glucose, capillary     Status: Abnormal   Collection Time: 12/26/16  6:05 AM  Result Value Ref Range   Glucose-Capillary 356 (H) 65 - 99 mg/dL   No results found.  Assessment/Plan: Diagnosis: Left transmetatarsal amputation Labs independently reviewed.  Records reviewed and summated above.  1. Does the need for close, 24 hr/day medical supervision in concert with the patient's rehab needs make it unreasonable for this patient to be served in a less intensive setting? Yes  2. Co-Morbidities requiring supervision/potential complications:  COPD (monitor O2 Sats and RR with increased activity), T2 DM with uncontrolled CBGs >400(Monitor in accordance with exercise and adjust meds as necessary), CAD s/p ICD (cone meds), ESRD- on PD (recs per Neprho),  PAD (cont meds), N/V (meds), leukocytosis (cont to monitor for signs and symptoms of infection, further workup if indicated), fever (cont to monitor), tachypnea (monitor RR and O2 Sats with increased physical exertion) 3. Due to bowel management, safety, skin/wound care, disease management, pain management and patient education, does the patient require 24 hr/day rehab nursing? Yes 4. Does the patient require coordinated care of a physician, rehab nurse, PT (1-2 hrs/day, 5 days/week) and OT (1-2 hrs/day, 5 days/week) to address physical and functional deficits in the context of the above  medical diagnosis(es)? Yes Addressing deficits in the following areas: balance, endurance, locomotion, strength, transferring, bathing, dressing, feeding, grooming and psychosocial support 5. Can the patient actively participate in an intensive therapy program of at least 3 hrs of therapy per day at least 5 days per week? Yes 6. The potential for patient to make measurable gains while on inpatient rehab is excellent 7. Anticipated functional outcomes upon discharge from inpatient rehab are modified independent and supervision  with PT, modified independent and supervision with OT, n/a with SLP. 8. Estimated rehab length of stay to reach the above functional goals is: 14-17 days. 9. Anticipated  D/C setting: Home 10. Anticipated post D/C treatments: HH therapy and Home excercise program 11. Overall Rehab/Functional Prognosis: excellent  RECOMMENDATIONS: This patient's condition is appropriate for continued rehabilitative care in the following setting: CIR Patient has agreed to participate in recommended program. Yes Note that insurance prior authorization may be required for reimbursement for recommended care.  Comment: Rehab Admissions Coordinator to follow up.  Maryla Morrow, MD, ABPMR Jacquelynn Cree, [place] 12/26/2016

## 2016-12-26 NOTE — Care Management Important Message (Signed)
Important Message  Patient Details  Name: [name] MRN: [id number] Date of Birth: 10/31/1940   Medicare Important Message Given:  Yes    Kyla Balzarine 12/26/2016, 10:56 AM

## 2016-12-26 NOTE — PMR Pre-admission (Signed)
PMR Admission Coordinator Pre-Admission Assessment  Patient: [name] is an 76 y.o., male MRN: [id number] DOB: 23-Nov-1940 Height: _0  (175.3 cm) Weight: 89.2 kg (196 lb 10.4 oz)             Insurance Information HMO: No    PPO:       PCP:       IPA:       80/20:       OTHER:   PRIMARY: Medicare A/b      Policy#: [id number]      Subscriber: [name] CM Name:        Phone#:       Fax#:   Pre-Cert#:        Employer:  Retired Benefits:  Phone #:       Name: Checked in [place] [facility] [employer] [place] [facility]. Date: 02/09/06     Deduct: $1340      Out of Pocket Max: none      Life Max: N/A CIR: 100%      SNF: 100 days Outpatient: 80%     Co-Pay: 20% Home Health: 100%      Co-Pay: none DME: 80%     Co-Pay: 20% Providers: patient's choice  SECONDARY: BCBS      Policy#: [id number]      Subscriber:  JGerome ApleyCM Name:        Phone#:       Fax#:   Pre-Cert#:        Employer: Retired Benefits:  Phone #: [phone]    Name:   Eff. Date:       Deduct:        Out of Pocket Max:        Life Max:   CIR:        SNF:   Outpatient:       Co-Pay:   Home Health:        Co-Pay:   DME:       Co-Pay:    Emergency Contact Information Contact Information    Name Relation Home Work Mobile   [name],[name] Spouse [phone]    Current Medical History  Patient Admitting Diagnosis: L transmetatarsal amputation  History of Present Illness: A 76y.o. male with history of COPD, T2DM, CAD s/p ICD, ESRD- on PD,  PAD with critical limb ischemia with non-healing LLE wound who was admitted for right femoral pseudoaneurysm repair and resection of Left 3rd MT head on 12/15/16 by Dr. CDonzetta Matters He failed attempts at limb salvage and hospital course complicated by N/V, leucocytosis and fever likely due to sepsis. GB ultrasound revealed GB polyp without acute cholecystitis. He has had issues with right groin wound dehiscence of wound with gangrenous changes left foot. He required left transmetatarsal  amputation with VAC placement by Dr. DSharol Givenon 9/14. Post op NWB LLE and antibiotics discontinued.  Dr. RConstance Holsterconsulted for input on sore throat, difficulty swallowing and oral pharyngeal ulcerations. He recommended supportive care for viral v/s antibiotics related ulcers. Right groin with small hematoma and decrease in erythema per reports. CIR recommended due to deficits in mobility and decreased ability to carry out ADL tasks.    Past Medical History  Past Medical History:  Diagnosis Date  . AICD (automatic cardioverter/defibrillator) present   . [place]ive [place]   . COPD (chronic obstructive pulmonary disease) ([place]   . Coronary artery disease    CABG 2011  . [place] complication [place]   .  ESRD on peritoneal dialysis [facility])    Started PD Dec 2016 in [place]. Nephrology is in [place].   [name] History of peritonitis    PD cath related peritonitis in April 2017  . Hypertension   . Peripheral vascular disease ([place])   . Sjogren's syndrome ([place])    Per pt's wife, was diagnosed in 2016 during w/u for cause of renal failure prior to starting dialysis.  He had a rash on his chest apparently prompting this w/u.  Never had renal bx.  He was referred to a rheum MD in [place] and per the wife he was treated with MTX and other medications but had side effects to "all of it" and isn't taking anything for this as of Jun 2017.   . Stroke [facility])     Family History  family history includes Heart disease in his father.  Prior Rehab/Hospitalizations: No previous rehab admissions.  Has the patient had major surgery during 100 days prior to admission? Yes.  Patient had an ICD/defibrillator placed on 11/23/16  Current Medications   Current Facility-Administered Medications:  .  0.9 %  sodium chloride infusion, , Intravenous, Continuous, Audry Pili, MD, Last Rate: 10 mL/hr at 12/23/16 1749 .  0.9 %  sodium chloride infusion, , Intravenous, Continuous, Newt Minion, MD, Last Rate: 10 mL/hr at 12/24/16 0142 .  acetaminophen (TYLENOL) tablet 325-650 mg, 325-650 mg, Oral, Q4H PRN, 650 mg at 12/17/16 2335 **OR** acetaminophen (TYLENOL) suppository 325-650 mg, 325-650 mg, Rectal, Q4H PRN, Rhyne, Samantha J, PA-C, 650 mg at 12/17/16 0047 .  acetaminophen (TYLENOL) tablet 650 mg, 650 mg, Oral, Q6H PRN **OR** acetaminophen (TYLENOL) suppository 650 mg, 650 mg, Rectal, Q6H PRN, Newt Minion, MD .  aspirin EC tablet 81 mg, 81 mg, Oral, Daily, Rhyne, Samantha J, PA-C, 81 mg at 12/27/16 0958 .  atorvastatin (LIPITOR) tablet 80 mg, 80 mg, Oral, q1800, Rhyne, Samantha J, PA-C, 80 mg at 12/26/16 1825 .  bisacodyl (DULCOLAX) suppository 10 mg, 10 mg, Rectal, Daily PRN, Rhyne, Samantha J, PA-C .  bisacodyl (DULCOLAX) suppository 10 mg, 10 mg, Rectal, Daily PRN, Newt Minion, MD .  calcium acetate (PHOSLO) capsule 667 mg, 667 mg, Oral, TID WC, Rhyne, Samantha J, PA-C, 667 mg at 12/27/16 1253 .  carvedilol (COREG) tablet 12.5 mg, 12.5 mg, Oral, BID WC, Rhyne, Samantha J, PA-C, 12.5 mg at 12/27/16 0842 .  clopidogrel (PLAVIX) tablet 75 mg, 75 mg, Oral, Daily, Rhyne, Samantha J, PA-C, 75 mg at 12/27/16 0958 .  dialysis solution 1.5% low-MG/low-CA dianeal solution, , Intraperitoneal, Q24H, Madelon Lips, MD, 5,000 mL at 12/24/16 1800 .  dialysis solution 1.5% low-MG/low-CA dianeal solution, , Intraperitoneal, Q24H, Madelon Lips, MD .  dialysis solution 2.5% low-MG/low-CA dianeal solution, , Intraperitoneal, Q24H, Madelon Lips, MD .  docusate sodium (COLACE) capsule 100 mg, 100 mg, Oral, BID, Newt Minion, MD, 100 mg at 12/27/16 0958 .  furosemide (LASIX) tablet 40 mg, 40 mg, Oral, BID, Rhyne, Samantha J, PA-C, 40 mg at 12/27/16 0842 .  gentamicin cream (GARAMYCIN) 0.1 % 1 application, 1 application, Topical, Daily, Waynetta Sandy, MD .  heparin 1000 unit/ml injection 2,500 Units, 2,500 Units, Intraperitoneal, PRN, Madelon Lips, MD .  heparin 2,500  Units in dialysis solution 2.5% low-MG/low-CA 5,000 mL dialysis solution, , Peritoneal Dialysis, PRN, Donato Heinz, MD .  heparin injection 5,000 Units, 5,000 Units, Subcutaneous, Q8H, Rhyne, Samantha J, PA-C, 5,000 Units at 12/27/16 0522 .  hydrALAZINE (APRESOLINE) injection 5 mg, 5  mg, Intravenous, Q20 Min PRN, Rhyne, Samantha J, PA-C .  HYDROmorphone (DILAUDID) injection 1 mg, 1 mg, Intravenous, Q2H PRN, Newt Minion, MD .  insulin aspart (novoLOG) injection 0-9 Units, 0-9 Units, Subcutaneous, TID WC, Rosita Fire, MD, 5 Units at 12/27/16 1253 .  insulin aspart (novoLOG) injection 8 Units, 8 Units, Subcutaneous, TID WC, Rosita Fire, MD, 8 Units at 12/27/16 1253 .  insulin glargine (LANTUS) injection 35 Units, 35 Units, Subcutaneous, BID, Carolin Sicks, Osie Bond, MD .  isosorbide mononitrate (IMDUR) 24 hr tablet 30 mg, 30 mg, Oral, Daily, Rhyne, Samantha J, PA-C, 30 mg at 12/27/16 0958 .  labetalol (NORMODYNE,TRANDATE) injection 10 mg, 10 mg, Intravenous, Q10 min PRN, Rhyne, Samantha J, PA-C .  lactulose (CHRONULAC) 10 GM/15ML solution 30 g, 30 g, Oral, BID, Rosita Fire, MD, 30 g at 12/26/16 2131 .  lidocaine (XYLOCAINE) 2 % jelly 1 application, 1 application, Topical, Once PRN, Izora Gala, MD .  lidocaine (XYLOCAINE) 4 % external solution 0-50 mL, 0-50 mL, Topical, Once PRN, Izora Gala, MD .  lidocaine-EPINEPHrine (XYLOCAINE-EPINEPHrine) 1 %-1:200000 (PF) injection 0-30 mL, 0-30 mL, Intradermal, Once PRN, Izora Gala, MD .  losartan (COZAAR) tablet 25 mg, 25 mg, Oral, QHS, Rhyne, Samantha J, PA-C, 25 mg at 12/26/16 2132 .  magic mouthwash, 5 mL, Oral, QID PRN, Ulyses Amor, PA-C, 5 mL at 12/26/16 1129 .  magnesium citrate solution 1 Bottle, 1 Bottle, Oral, Once PRN, Newt Minion, MD .  MEDLINE mouth rinse, 15 mL, Mouth Rinse, BID, Waynetta Sandy, MD, 15 mL at 12/27/16 1000 .  methocarbamol (ROBAXIN) tablet 500 mg, 500 mg, Oral, Q6H PRN, 500  mg at 12/25/16 0851 **OR** methocarbamol (ROBAXIN) 500 mg in dextrose 5 % 50 mL IVPB, 500 mg, Intravenous, Q6H PRN, Newt Minion, MD .  metoCLOPramide (REGLAN) tablet 5-10 mg, 5-10 mg, Oral, Q8H PRN **OR** metoCLOPramide (REGLAN) injection 5-10 mg, 5-10 mg, Intravenous, Q8H PRN, Newt Minion, MD .  metoprolol tartrate (LOPRESSOR) injection 2-5 mg, 2-5 mg, Intravenous, Q2H PRN, Rhyne, Samantha J, PA-C .  morphine 2 MG/ML injection 2 mg, 2 mg, Intravenous, Q2H PRN, Rhyne, Samantha J, PA-C, 2 mg at 12/19/16 0835 .  nitroGLYCERIN (NITROSTAT) SL tablet 0.4 mg, 0.4 mg, Sublingual, Q5 min PRN, Rhyne, Samantha J, PA-C .  ondansetron (ZOFRAN) tablet 4 mg, 4 mg, Oral, Q6H PRN **OR** ondansetron (ZOFRAN) injection 4 mg, 4 mg, Intravenous, Q6H PRN, Newt Minion, MD, 4 mg at 12/26/16 1225 .  oxyCODONE (Oxy IR/ROXICODONE) immediate release tablet 5-10 mg, 5-10 mg, Oral, Q3H PRN, Newt Minion, MD, 10 mg at 12/26/16 1433 .  oxyCODONE-acetaminophen (PERCOCET/ROXICET) 5-325 MG per tablet 1-2 tablet, 1-2 tablet, Oral, Q6H PRN, Rhyne, Samantha J, PA-C, 2 tablet at 12/27/16 1253 .  oxymetazoline (AFRIN) 0.05 % nasal spray 1 spray, 1 spray, Each Nare, Once PRN, Izora Gala, MD .  pantoprazole (PROTONIX) EC tablet 40 mg, 40 mg, Oral, Daily, Rhyne, Samantha J, PA-C, 40 mg at 12/27/16 0958 .  phenol (CHLORASEPTIC) mouth spray 1 spray, 1 spray, Mouth/Throat, PRN, Rhyne, Samantha J, PA-C, 1 spray at 12/22/16 1204 .  polyethylene glycol (MIRALAX / GLYCOLAX) packet 17 g, 17 g, Oral, Daily PRN, Newt Minion, MD .  polyethylene glycol (MIRALAX / GLYCOLAX) packet 17 g, 17 g, Oral, Daily, Madelon Lips, MD, 17 g at 12/25/16 1219 .  potassium chloride SA (K-DUR,KLOR-CON) CR tablet 20 mEq, 20 mEq, Oral, BID, Madelon Lips, MD, 20 mEq at 12/27/16 0958 .  silver nitrate applicators applicator 1 Stick, 1 Stick, Topical, Once PRN, Izora Gala, MD .  triamcinolone (KENALOG) 0.1 % paste, , Mouth/Throat, TID, Donzetta Matters, Georgia Dom, MD .  TRIPLE ANTIBIOTIC [phone] OINT 1 application, 1 application, Apply externally, Once PRN, Izora Gala, MD .  Derrill Memo ON 01/08/2017] Vitamin D (Ergocalciferol) (DRISDOL) capsule 50,000 Units, 50,000 Units, Oral, Q30 days, Rhyne, Samantha J, PA-C  Patients Current Diet: Diet Carb Modified Fluid consistency: Thin; Room service appropriate? Yes  Precautions / Restrictions Precautions Precautions: Fall Other Brace/Splint: post op shoe on Lt foot Restrictions Weight Bearing Restrictions: Yes LLE Weight Bearing: Non weight bearing LLE Partial Weight Bearing Percentage or Pounds: Darco shoe   Has the patient had 2 or more falls or a fall with injury in the past year?No  Prior Activity Level Limited Community (1-2x/wk): Went out 2-3 X a week.  Not driving since 07/19/79 when ICD was placed.  wife drives.  Home Assistive Devices / Equipment Home Assistive Devices/Equipment: BIPAP, CBG Meter, Wheelchair Home Equipment: Environmental consultant - 4 wheels, Cane - quad, Wheelchair - manual  Prior Device Use: Indicate devices/aids used by the patient prior to current illness, exacerbation or injury? Manual wheelchair.  Uses wheelchair for shopping such as at [facility] for longer distances.  Prior Functional Level Prior Function Level of Independence: Needs assistance Gait / Transfers Assistance Needed: uses [name], RW ADL's / Homemaking Assistance Needed: A for socks, can bath self in shower  Self Care: Did the patient need help bathing, dressing, using the toilet or eating?  Independent  Indoor Mobility: Did the patient need assistance with walking from room to room (with or without device)? Independent  Stairs: Did the patient need assistance with internal or external stairs (with or without device)? Independent  Functional Cognition: Did the patient need help planning regular tasks such as shopping or remembering to take medications? Independent  Current Functional Level Cognition   Overall Cognitive Status: Within Functional Limits for tasks assessed Orientation Level: Oriented X4    Extremity Assessment (includes Sensation/Coordination)  Upper Extremity Assessment: Overall WFL for tasks assessed  Lower Extremity Assessment: LLE deficits/detail LLE Deficits / Details: L foot and ankle ROM limited by surgical intervention, knee and hip strength grossly 4/5 LLE: Unable to fully assess due to immobilization    ADLs  Overall ADL's : Needs assistance/impaired Eating/Feeding: Sitting, Independent Eating/Feeding Details (indicate cue type and reason): Pt can independently feed himself but was having his wife feed him on OT arrival.  Grooming: Set up, Sitting Grooming Details (indicate cue type and reason): Becomes winded when completing seated ADL tasks.  Upper Body Bathing: Supervision/ safety, Sitting Upper Body Bathing Details (indicate cue type and reason): Fatigues easily Lower Body Bathing: Maximal assistance Lower Body Bathing Details (indicate cue type and reason): min A +2 sit<>stand Upper Body Dressing : Set up, Sitting Lower Body Dressing: Maximal assistance, Sit to/from stand Lower Body Dressing Details (indicate cue type and reason): Able to assist therapist to doff Darco shoe Toilet Transfer: Minimal assistance (lateral scoot with drop arm) Toilet Transfer Details (indicate cue type and reason): lateral scoot from drop arm to [facility] Toileting- Clothing Manipulation and Hygiene: Total assistance Toileting - Clothing Manipulation Details (indicate cue type and reason): Pt able to complete chair push-up to create space for pericare.  General ADL Comments: Session focused on transfers to and from [facility]. Initiated education concerning AE for LB ADL and pt needs reinforcement.     Mobility  Overal bed mobility: Needs Assistance Bed Mobility:  Sit to Supine Supine to sit: Min assist, HOB elevated Sit to supine: Min assist, +2 for safety/equipment General bed  mobility comments: Min assist +2 for linesto manage B LE.     Transfers  Overall transfer level: Needs assistance Equipment used: Rolling walker (2 wheeled), Ambulation equipment used Transfers: Lateral/Scoot Transfers Sit to Stand: Min assist, +2 safety/equipment Stand pivot transfers: Min assist, From elevated surface  Lateral/Scoot Transfers: Min assist, Mod assist General transfer comment: Min assist for transfer to [facility]; mod assist for transfer back to chair. Min assist for bed to chair. Lateral scoot for all three.    Ambulation / Gait / Stairs / Wheelchair Mobility  Ambulation/Gait Ambulation/Gait assistance: +2 safety/equipment, Min assist Ambulation Distance (Feet): 40 Feet Assistive device: Rolling walker (2 wheeled) Gait Pattern/deviations: Step-to pattern, Decreased stance time - left, Decreased step length - right, Decreased weight shift to left, Trunk flexed General Gait Details: cues for posture and proximity of RW to aid in limiting weight bearing on L LE; pt with less difficulty maintaining weight mostly on L heel with tennis shoe donned on R Gait velocity: decreased    Posture / Balance Dynamic Sitting Balance Sitting balance - Comments: with attempts to scoot forward to EOB pt with posterior lean Balance Overall balance assessment: Needs assistance Sitting-balance support: No upper extremity supported, Feet supported Sitting balance-Leahy Scale: Fair Sitting balance - Comments: with attempts to scoot forward to EOB pt with posterior lean Standing balance support: Bilateral upper extremity supported Standing balance-Leahy Scale: Poor Standing balance comment: Mod assist to maintain standing in Stedy x 30 sec with verbal cues and assist to maintain NWB    Special needs/care consideration BiPAP/CPAP Yes, BIPAP CPM No Continuous Drip IV No Dialysis Yes, PD daily at night       Life Vest No, has an implanted ICD Oxygen No Special Bed No Trach Size No Wound Vac  (area) Yes      Location:  Left foot transmetatarsal amputation site Skin:  New L trans met amputation site with VAC in place                              Bowel mgmt: Last BM 12/27/16  Bladder mgmt: Voiding in urinal Diabetic mgmt Yes, on insulin at home    Previous Home Environment Living Arrangements: Spouse/significant other Available Help at Discharge: Family, Available 24 hours/day Type of Home: Mobile home Home Layout: One level Home Access: Stairs to enter Entrance Stairs-Rails: Left Entrance Stairs-Number of Steps: 1 Bathroom Shower/Tub: Gaffer, Charity fundraiser: Standard Home Care Services: No  Discharge Living Setting Plans for Discharge Living Setting: Lives with (comment), Mobile Home (Lives with wife in a mobile home.) Type of Home at Discharge: Mobile home (single wide.) Discharge Home Layout: One level Discharge Home Access: Stairs to enter Entrance Stairs-Number of Steps: 1 step and then small threshold step into mobile home. Does the patient have any problems obtaining your medications?: No  Social/Family/Support Systems Patient Roles: Spouse, Parent (Has a wife, a son and a daughter.) Contact Information: [name] - wife  Anticipated Caregiver: wife Anticipated Caregiver's Contact Information: Mariann Laster - wife - [phone] Ability/Limitations of Caregiver: Wife does not work and can assist at home Caregiver Availability: 24/7 Discharge Plan Discussed with Primary Caregiver: Yes Is Caregiver In Agreement with Plan?: Yes Does Caregiver/Family have Issues with Lodging/Transportation while Pt is in Rehab?: No  Goals/Additional Needs Patient/Family Goal for Rehab: PT/OT mod  I and supervision goals Expected length of stay: 14-17 days Cultural Considerations: None Dietary Needs: Carb mod, med cal, thin liquids Equipment Needs: TBD Special Service Needs: On peritoneal dialysis daily. Pt/Family Agrees to Admission and willing to participate:  Yes Program Orientation Provided & Reviewed with Pt/Caregiver Including Roles  & Responsibilities: Yes  Decrease burden of Care through IP rehab admission: N/A  Possible need for SNF placement upon discharge: Not anticipated  Patient Condition: This patient's condition remains as documented in the consult dated 12/26/16, in which the Rehabilitation Physician determined and documented that the patient's condition is appropriate for intensive rehabilitative care in an inpatient rehabilitation facility. Will admit to inpatient rehab today.  Preadmission Screen Completed By:  [name], 12/27/2016 3:08 PM ______________________________________________________________________   Discussed status with Dr. Naaman Plummer on 12/27/16 at 1508 and received telephone approval for admission today.  Admission Coordinator:  [name], time 1508/Date 12/27/16

## 2016-12-26 NOTE — Progress Notes (Signed)
Inpatient Diabetes Program Recommendations  AACE/ADA: New Consensus Statement on Inpatient Glycemic Control (2015)  Target Ranges:  Prepandial:   less than 140 mg/dL      Peak postprandial:   less than 180 mg/dL (1-2 hours)      Critically ill patients:  140 - 180 mg/dL   Lab Results  Component Value Date   GLUCAP 440 (H) 12/26/2016   HGBA1C 8.8 (H) 12/24/2016    Review of Glycemic Control Results for [name], [name] (MRN [id number]) as of 12/26/2016 14:35  Ref. Range 12/25/2016 12:01 12/25/2016 15:59 12/25/2016 21:14 12/26/2016 06:05 12/26/2016 11:35  Glucose-Capillary Latest Ref Range: 65 - 99 mg/dL 829 (H) 562 (H) 130 (H) 356 (H) 440 (H)   Inpatient Diabetes Program Recommendations:    Please consider increase in Lantus to 35 units daily.  Thank you, Billy Fischer. Joanthan Hlavacek, RN, MSN, CDE  Diabetes Coordinator Inpatient Glycemic Control Team Team Pager [phone] (8am-5pm) 12/26/2016 2:35 PM

## 2016-12-26 NOTE — Progress Notes (Signed)
Patient ID: [name], male   DOB: 01/12/1941, 76 y.o.   MRN: [id number] Postoperative day 2 left transmetatarsal amputation. The wound VAC is functioning well the dressing is dry with no drainage. Patient is safe for discharge from an orthopedic standpoint nonweightbearing on the left foot.

## 2016-12-26 NOTE — Progress Notes (Addendum)
  Progress Note    12/26/2016 8:04 AM 3 Days Post-Op  Subjective:  Feels better today; says his right groin feels better today too.  Tm 99.8 now afebrile HR 70's-100's NSR 90's-140's systolic 89% CPAP this am otherwise has been 97-100%   Vitals:   12/26/16 0007 12/26/16 0351  BP: (!) 110/53 (!) 96/56  Pulse: 82 76  Resp: (!) 22 15  Temp: 99.8 F (37.7 C) 98.4 F (36.9 C)  SpO2: 97% 97%    Physical Exam: Cardiac:  regular Lungs:  Non labored Incisions:  Left TMA site with vac and good seal; right groin with less erythema; some old bloody drainage on 4x4 right groin  Extremities:  Easily palpable left DP pulse   CBC    Component Value Date/Time   WBC 10.5 12/23/2016 0652   RBC 3.33 (L) 12/23/2016 0652   HGB 10.3 (L) 12/23/2016 0652   HCT 31.6 (L) 12/23/2016 0652   PLT 308 12/23/2016 0652   MCV 94.9 12/23/2016 0652   MCH 30.9 12/23/2016 0652   MCHC 32.6 12/23/2016 0652   RDW 14.4 12/23/2016 0652   LYMPHSABS 1.5 11/26/2016 0550   MONOABS 0.9 11/26/2016 0550   EOSABS 0.2 11/26/2016 0550   BASOSABS 0.0 11/26/2016 0550    BMET    Component Value Date/Time   NA 132 (L) 12/23/2016 0652   K 3.8 12/23/2016 0652   CL 96 (L) 12/23/2016 0652   CO2 28 12/23/2016 0652   GLUCOSE 232 (H) 12/23/2016 0652   BUN 30 (H) 12/23/2016 0652   CREATININE 6.99 (H) 12/23/2016 0652   CALCIUM 8.1 (L) 12/23/2016 0652   GFRNONAA 7 (L) 12/23/2016 0652   GFRAA 8 (L) 12/23/2016 0652    INR    Component Value Date/Time   INR 1.22 09/21/2015 1100     Intake/Output Summary (Last 24 hours) at 12/26/16 0804 Last data filed at 12/25/16 1010  Gross per 24 hour  Intake            17492 ml  Output            18283 ml  Net             -791 ml     Assessment:  76 y.o. male is s/p:  Open repair of right common femoral pseudoaneurysm 11 Days Post-Op and Left TMA  3 Days Post-Op  Plan: -pt looks better this morning -left TMA with vac with good seal -right groin with improved  erythema -renal d/c'd abx - per renal:  PD cultures negative so far.  Unfortunately no cell ct was sent at the time of fever- so that doesn't rule out the possibility of culture neg peritonitis which would have been proven/disproven with the cell ct.  A cell count from this AM revealed 3 WBCs in the whole sample- would be very strange to have a resolving PD peritonitis with only 3 cells in ct, so I'm stopping abx and following.  ? If there was a viral syndrome causing the mucositis.  Apprec ENT. -DVT prophylaxis:  SQ heparin -blood sugars are still labile-appreciate TRH assistance with this pt -will order CIR consult   Doreatha Massed, PA-C [facility] [phone] 12/26/2016 8:04 AM

## 2016-12-26 NOTE — Progress Notes (Signed)
Rehab admissions - I met with patient and his wife at the bedside.  They are interested in inpatient rehab.  Patient is on PD nightly.  I gave wife and patient rehab booklets.  I will follow up in am for bed availability and medical readiness.  Call me for questions.  #[phone]

## 2016-12-26 NOTE — Progress Notes (Signed)
Subjective  -   Feels a lot better this morning   Physical Exam:  Sitting up in bed Palpable pedal pulse Wound VAC in place    Assessment/Plan:    Appreciate hospitalist support regarding medical issues  Continue with PT/OT.  Patient a potential candidate for rehabilitation admission.    Durene Cal 12/26/2016 9:58 AM --  Vitals:   12/26/16 0351 12/26/16 0838  BP: (!) 96/56 (!) 104/56  Pulse: 76 92  Resp: 15 20  Temp: 98.4 F (36.9 C) (!) 97.4 F (36.3 C)  SpO2: 97% 96%    Intake/Output Summary (Last 24 hours) at 12/26/16 0958 Last data filed at 12/25/16 1010  Gross per 24 hour  Intake            17492 ml  Output            18283 ml  Net             -791 ml     Laboratory CBC    Component Value Date/Time   WBC 10.5 12/23/2016 0652   HGB 10.3 (L) 12/23/2016 0652   HCT 31.6 (L) 12/23/2016 0652   PLT 308 12/23/2016 0652    BMET    Component Value Date/Time   NA 132 (L) 12/23/2016 0652   K 3.8 12/23/2016 0652   CL 96 (L) 12/23/2016 0652   CO2 28 12/23/2016 0652   GLUCOSE 232 (H) 12/23/2016 0652   BUN 30 (H) 12/23/2016 0652   CREATININE 6.99 (H) 12/23/2016 0652   CALCIUM 8.1 (L) 12/23/2016 0652   GFRNONAA 7 (L) 12/23/2016 0652   GFRAA 8 (L) 12/23/2016 0652    COAG Lab Results  Component Value Date   INR 1.22 09/21/2015   No results found for: PTT  Antibiotics Anti-infectives    Start     Dose/Rate Route Frequency Ordered Stop   12/23/16 1200  ceFAZolin (ANCEF) IVPB 1 g/50 mL premix     1 g 100 mL/hr over 30 Minutes Intravenous Every 24 hr x 2 12/23/16 1024 12/24/16 1152   12/23/16 0600  ceFAZolin (ANCEF) IVPB 2g/100 mL premix     2 g 200 mL/hr over 30 Minutes Intravenous To Short Stay 12/22/16 1548 12/23/16 0946   12/18/16 0600  cefTAZidime (FORTAZ) 500 mg in dextrose 5 % 50 mL IVPB  Status:  Discontinued     500 mg 100 mL/hr over 30 Minutes Intravenous Every 24 hours 12/17/16 0343 12/24/16 1046   12/17/16 2000  linezolid  (ZYVOX) IVPB 600 mg  Status:  Discontinued     600 mg 300 mL/hr over 60 Minutes Intravenous Every 12 hours 12/17/16 1353 12/22/16 1407   12/17/16 0500  linezolid (ZYVOX) IVPB 600 mg     600 mg 300 mL/hr over 60 Minutes Intravenous  Once 12/17/16 0400 12/17/16 0702   12/17/16 0400  cefTAZidime (FORTAZ) 1 g in dextrose 5 % 50 mL IVPB  Status:  Discontinued     1 g 100 mL/hr over 30 Minutes Intravenous Every 24 hours 12/17/16 0116 12/17/16 0343   12/17/16 0400  cefTAZidime (FORTAZ) 1 g in dextrose 5 % 50 mL IVPB     1 g 100 mL/hr over 30 Minutes Intravenous  Once 12/17/16 0343 12/17/16 0557   12/16/16 1200  cefUROXime (ZINACEF) 1.5 g in dextrose 5 % 50 mL IVPB     1.5 g 100 mL/hr over 30 Minutes Intravenous  Once 12/15/16 1812 12/16/16 1428   12/15/16 1227  dextrose 5 %  with cefUROXime (ZINACEF) ADS Med    Comments:  Forte, Lindsi   : cabinet override      12/15/16 1227 12/15/16 1325   12/15/16 1226  cefUROXime (ZINACEF) 1.5 g in dextrose 5 % 50 mL IVPB     1.5 g 100 mL/hr over 30 Minutes Intravenous 30 min pre-op 12/15/16 1226 12/15/16 1325       V. Charlena Cross, M.D. [facility] Office: [phone] Pager:  [phone]

## 2016-12-26 NOTE — Progress Notes (Addendum)
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  Assessment & Plan:   # Uncontrolled type 2 diabetes mellitus with hyperglycemia, with long-term current use of insulin (HCC): -patient's labile blood sugar table likely due to reduced GFR, being an PD and surgical stress. -Blood sugar level elevated and has A1c of 8.8. I increased the dose of Lantus to 20 units BID and novolog injection to 8 units 3 times a day with food. Continue sliding scale. Monitor blood sugar level. Discussed with the patient and his wife at bedside.   #Acute metabolic encephalopathy: Patient's mental status improved and around baseline. Continue to monitor.  #Hypertension: Blood pressure acceptable. Continue current cardiac medication. Monitor blood pressure.  #ESRD on peritoneal dialysis: Patient was on antibiotics for presumed peritonitis. Off antibiotics  as per nephrologist. Cultures negative.  # Peripheral artery disease, gangrene of left foot is status post amputation and has a wound VAC: Further management as per surgery team.  # History of coronary artery disease status post CABG: Patient has no chest pain. Continue any aspirin, Lipitor, Coreg, Plavix, Lasix, losartan  We will continue to follow up. Thank you very much for the consult. Recommended to follow-up with PCP if patient is being discharged. Monitor blood sugar level and adjust dose of insulin.  DVT prophylaxis: Heparin subcutaneous Code Status: Full code Family Communication: Discussed with the patient's wife at  bedside  Subjective: Seen and examined at bedside. Denied headache, dizziness, nausea vomiting chest pain shortness of breath. Objective: Vitals:   12/25/16 1930 12/26/16 0007 12/26/16 0351 12/26/16 0838  BP: 140/65 (!) 110/53 (!) 96/56 (!) 104/56  Pulse: 92 82 76 92  Resp: (!) 21 (!) Temp: 98.4 F (36.9 C) 99.8 F (37.7 C) 98.4 F (36.9 C) (!) 97.4 F (36.3 C)  TempSrc: Axillary Axillary Axillary Oral  SpO2: 97% 97% 97% 96%  Weight: 94.1 kg (207 lb 7.3 oz)     Height:       No intake or output data in the 24 hours ending 12/26/16 1126 Filed Weights   12/24/16 0757 12/24/16 1815 12/25/16 1930  Weight: 96.7 kg (213 lb 3 oz) 90.9 kg (200 lb 6.4 oz) 94.1 kg (207 lb 7.3 oz)    Examination:  General exam: Lying on bed comfortable, not in distress  Respiratory system: Clear bilaterally, respiratory effort normal, no wheezing or crackle Cardiovascular system: Regular rate and rhythm, S1-S2 normal.  Gastrointestinal system: Abdomen is nondistended, soft and nontender. Normal bowel sounds heard. Central nervous system: Alert and oriented. No focal neurological deficits. Extremities: Left foot with dressing and wound VAC Psychiatry: Judgement and insight appear normal. Mood & affect appropriate.     Data Reviewed: I have personally reviewed following labs and imaging studies  CBC:  Recent Labs Lab 12/20/16 0350 12/23/16 0652  WBC 9.6 10.5  HGB 9.6* 10.3*  HCT 29.4* 31.6*  MCV 92.7 94.9  PLT 272 308   Basic Metabolic Panel:  Recent Labs Lab 12/21/16 0905 12/23/16 0652  NA 130* 132*  K 3.0* 3.8  CL 93* 96*  CO2 26 28  GLUCOSE 465* 232*  BUN 26* 30*  CREATININE 6.46* 6.99*  CALCIUM 8.2* 8.1*   GFR: Estimated Creatinine Clearance: 10.3 mL/min (A) (by C-G formula based on SCr of 6.99 mg/dL (H)). Liver Function Tests: No results for input(s): AST, ALT, ALKPHOS, BILITOT, PROT, ALBUMIN in the last 168 hours. No results for input(s): LIPASE, AMYLASE in  the last 168 hours. No results for input(s): AMMONIA in the last 168 hours. Coagulation Profile: No results for input(s): INR, PROTIME in the last 168 hours. Cardiac Enzymes: No results for input(s): CKTOTAL, CKMB, CKMBINDEX, TROPONINI in the last 168 hours. BNP (last 3 results) No results for input(s): PROBNP in the last 8760 hours. HbA1C:  Recent Labs  12/24/16 1159  HGBA1C 8.8*   CBG:  Recent Labs Lab 12/25/16 0611 12/25/16 1201 12/25/16 1559 12/25/16 2114 12/26/16 0605  GLUCAP 395* 353* 259* 213* 356*   Lipid Profile: No results for input(s): CHOL, HDL, LDLCALC, TRIG, CHOLHDL, LDLDIRECT in the last 72 hours. Thyroid Function Tests: No results for input(s): TSH, T4TOTAL, FREET4, T3FREE, THYROIDAB in the last 72 hours. Anemia Panel: No results for input(s): VITAMINB12, FOLATE, FERRITIN, TIBC, IRON, RETICCTPCT in the last 72 hours. Sepsis Labs: No results for input(s): PROCALCITON, LATICACIDVEN in the last 168 hours.  Recent Results (from the past 240 hour(s))  Culture, blood (single) w Reflex to ID Panel     Status: None   Collection Time: 12/17/16  2:06 AM  Result Value Ref Range Status   Specimen Description BLOOD RIGHT HAND  Final   Special Requests   Final    BOTTLES DRAWN AEROBIC AND ANAEROBIC Blood Culture adequate volume   Culture NO GROWTH 5 DAYS  Final   Report Status 12/22/2016 FINAL  Final  Body fluid culture     Status: None   Collection Time: 12/17/16  8:40 AM  Result Value Ref Range Status   Specimen Description PERITONEAL  Final   Special Requests NONE  Final   Gram Stain   Final    WBC PRESENT,BOTH PMN AND MONONUCLEAR NO ORGANISMS SEEN CYTOSPIN SMEAR    Culture NO GROWTH 3 DAYS  Final   Report Status 12/23/2016 FINAL  Final  Surgical PCR screen     Status: None   Collection Time: 12/23/16  5:12 AM  Result Value Ref Range Status   MRSA, PCR NEGATIVE NEGATIVE Final   Staphylococcus aureus NEGATIVE NEGATIVE Final    Comment: (NOTE) The  Xpert SA Assay (FDA approved for NASAL specimens in patients 77 years of age and older), is one component of a comprehensive surveillance program. It is not intended to diagnose infection nor to guide or monitor treatment.          Radiology Studies: No results found.      Scheduled Meds: . aspirin EC  81 mg Oral Daily  . atorvastatin  80 mg Oral q1800  . calcium acetate  667 mg Oral TID WC  . carvedilol  12.5 mg Oral BID WC  . clopidogrel  75 mg Oral Daily  . docusate sodium  100 mg Oral BID  . furosemide  40 mg Oral BID  . gentamicin cream  1 application Topical Daily  . heparin  5,000 Units Subcutaneous Q8H  . insulin aspart  0-9 Units Subcutaneous TID WC  . insulin aspart  5 Units Subcutaneous TID WC  . insulin glargine  25 Units Subcutaneous Daily  . isosorbide mononitrate  30 mg Oral Daily  . lactulose  30 g Oral BID  . losartan  25 mg Oral QHS  . mouth rinse  15 mL Mouth Rinse BID  . pantoprazole  40 mg Oral Daily  . polyethylene glycol  17 g Oral Daily  . potassium chloride  20 mEq Oral BID  . triamcinolone   Mouth/Throat TID  . [START ON 01/08/2017] Vitamin D (Ergocalciferol)  50,000 Units Oral Q30 days   Continuous Infusions: . sodium chloride 10 mL/hr at 12/23/16 0812  . sodium chloride 10 mL/hr at 12/24/16 0142  . dialysis solution 1.5% low-MG/low-CA    . dialysis solution 1.5% low-MG/low-CA    . dialysis solution 2.5% low-MG/low-CA    . methocarbamol (ROBAXIN)  IV       LOS: 11 days    Dron Jaynie Collins, MD [facility] Pager [phone]  If 7PM-7AM, please contact night-coverage [email or web address] Password [email or web address] 12/26/2016, 11:26 AM

## 2016-12-27 ENCOUNTER — Inpatient Hospital Stay (HOSPITAL_COMMUNITY)
Admission: RE | Admit: 2016-12-27 | Discharge: 2017-01-06 | DRG: 559 | Disposition: A | Discharge status: Other Acute Inpatient Hospital | Payer: Medicare Other | Source: Intra-hospital | Attending: Physical Medicine & Rehabilitation | Admitting: Physical Medicine & Rehabilitation

## 2016-12-27 ENCOUNTER — Encounter (HOSPITAL_COMMUNITY): Payer: Self-pay | Admitting: Nurse Practitioner

## 2016-12-27 DIAGNOSIS — Z794 Long term (current) use of insulin: Secondary | ICD-10-CM

## 2016-12-27 DIAGNOSIS — Z23 Encounter for immunization: Secondary | ICD-10-CM | POA: Diagnosis not present

## 2016-12-27 DIAGNOSIS — IMO0002 Reserved for concepts with insufficient information to code with codable children: Secondary | ICD-10-CM

## 2016-12-27 DIAGNOSIS — G546 Phantom limb syndrome with pain: Secondary | ICD-10-CM | POA: Diagnosis not present

## 2016-12-27 DIAGNOSIS — D72829 Elevated white blood cell count, unspecified: Secondary | ICD-10-CM | POA: Diagnosis not present

## 2016-12-27 DIAGNOSIS — L98491 Non-pressure chronic ulcer of skin of other sites limited to breakdown of skin: Secondary | ICD-10-CM

## 2016-12-27 DIAGNOSIS — G4733 Obstructive sleep apnea (adult) (pediatric): Secondary | ICD-10-CM | POA: Diagnosis present

## 2016-12-27 DIAGNOSIS — L89812 Pressure ulcer of head, stage 2: Secondary | ICD-10-CM | POA: Diagnosis not present

## 2016-12-27 DIAGNOSIS — Z4781 Encounter for orthopedic aftercare following surgical amputation: Principal | ICD-10-CM

## 2016-12-27 DIAGNOSIS — Z7982 Long term (current) use of aspirin: Secondary | ICD-10-CM

## 2016-12-27 DIAGNOSIS — I10 Essential (primary) hypertension: Secondary | ICD-10-CM | POA: Diagnosis not present

## 2016-12-27 DIAGNOSIS — T888XXA Other specified complications of surgical and medical care, not elsewhere classified, initial encounter: Secondary | ICD-10-CM | POA: Diagnosis not present

## 2016-12-27 DIAGNOSIS — R42 Dizziness and giddiness: Secondary | ICD-10-CM | POA: Diagnosis not present

## 2016-12-27 DIAGNOSIS — I959 Hypotension, unspecified: Secondary | ICD-10-CM | POA: Diagnosis not present

## 2016-12-27 DIAGNOSIS — R509 Fever, unspecified: Secondary | ICD-10-CM

## 2016-12-27 DIAGNOSIS — I132 Hypertensive heart and chronic kidney disease with heart failure and with stage 5 chronic kidney disease, or end stage renal disease: Secondary | ICD-10-CM | POA: Diagnosis present

## 2016-12-27 DIAGNOSIS — B999 Unspecified infectious disease: Secondary | ICD-10-CM

## 2016-12-27 DIAGNOSIS — Z888 Allergy status to other drugs, medicaments and biological substances status: Secondary | ICD-10-CM

## 2016-12-27 DIAGNOSIS — I509 Heart failure, unspecified: Secondary | ICD-10-CM | POA: Diagnosis not present

## 2016-12-27 DIAGNOSIS — Z79899 Other long term (current) drug therapy: Secondary | ICD-10-CM

## 2016-12-27 DIAGNOSIS — T8130XA Disruption of wound, unspecified, initial encounter: Secondary | ICD-10-CM | POA: Diagnosis not present

## 2016-12-27 DIAGNOSIS — E1151 Type 2 diabetes mellitus with diabetic peripheral angiopathy without gangrene: Secondary | ICD-10-CM

## 2016-12-27 DIAGNOSIS — Z7902 Long term (current) use of antithrombotics/antiplatelets: Secondary | ICD-10-CM

## 2016-12-27 DIAGNOSIS — J449 Chronic obstructive pulmonary disease, unspecified: Secondary | ICD-10-CM | POA: Diagnosis present

## 2016-12-27 DIAGNOSIS — E1122 Type 2 diabetes mellitus with diabetic chronic kidney disease: Secondary | ICD-10-CM | POA: Diagnosis present

## 2016-12-27 DIAGNOSIS — G253 Myoclonus: Secondary | ICD-10-CM | POA: Diagnosis not present

## 2016-12-27 DIAGNOSIS — N186 End stage renal disease: Secondary | ICD-10-CM | POA: Diagnosis present

## 2016-12-27 DIAGNOSIS — D638 Anemia in other chronic diseases classified elsewhere: Secondary | ICD-10-CM | POA: Diagnosis present

## 2016-12-27 DIAGNOSIS — R0989 Other specified symptoms and signs involving the circulatory and respiratory systems: Secondary | ICD-10-CM

## 2016-12-27 DIAGNOSIS — E1165 Type 2 diabetes mellitus with hyperglycemia: Secondary | ICD-10-CM | POA: Diagnosis present

## 2016-12-27 DIAGNOSIS — E11621 Type 2 diabetes mellitus with foot ulcer: Secondary | ICD-10-CM | POA: Diagnosis present

## 2016-12-27 DIAGNOSIS — R443 Hallucinations, unspecified: Secondary | ICD-10-CM | POA: Diagnosis not present

## 2016-12-27 DIAGNOSIS — Z8673 Personal history of transient ischemic attack (TIA), and cerebral infarction without residual deficits: Secondary | ICD-10-CM

## 2016-12-27 DIAGNOSIS — E871 Hypo-osmolality and hyponatremia: Secondary | ICD-10-CM | POA: Diagnosis present

## 2016-12-27 DIAGNOSIS — E876 Hypokalemia: Secondary | ICD-10-CM | POA: Diagnosis present

## 2016-12-27 DIAGNOSIS — E669 Obesity, unspecified: Secondary | ICD-10-CM | POA: Diagnosis present

## 2016-12-27 DIAGNOSIS — Z89432 Acquired absence of left foot: Secondary | ICD-10-CM

## 2016-12-27 DIAGNOSIS — Z91048 Other nonmedicinal substance allergy status: Secondary | ICD-10-CM

## 2016-12-27 DIAGNOSIS — I2510 Atherosclerotic heart disease of native coronary artery without angina pectoris: Secondary | ICD-10-CM | POA: Diagnosis present

## 2016-12-27 DIAGNOSIS — Z992 Dependence on renal dialysis: Secondary | ICD-10-CM

## 2016-12-27 DIAGNOSIS — Z6828 Body mass index (BMI) 28.0-28.9, adult: Secondary | ICD-10-CM

## 2016-12-27 DIAGNOSIS — K5900 Constipation, unspecified: Secondary | ICD-10-CM | POA: Diagnosis present

## 2016-12-27 DIAGNOSIS — Z95810 Presence of automatic (implantable) cardiac defibrillator: Secondary | ICD-10-CM

## 2016-12-27 DIAGNOSIS — Z951 Presence of aortocoronary bypass graft: Secondary | ICD-10-CM

## 2016-12-27 DIAGNOSIS — E1142 Type 2 diabetes mellitus with diabetic polyneuropathy: Secondary | ICD-10-CM

## 2016-12-27 DIAGNOSIS — L97519 Non-pressure chronic ulcer of other part of right foot with unspecified severity: Secondary | ICD-10-CM

## 2016-12-27 DIAGNOSIS — M3500 Sicca syndrome, unspecified: Secondary | ICD-10-CM | POA: Diagnosis present

## 2016-12-27 DIAGNOSIS — R5381 Other malaise: Secondary | ICD-10-CM | POA: Diagnosis present

## 2016-12-27 DIAGNOSIS — D62 Acute posthemorrhagic anemia: Secondary | ICD-10-CM | POA: Diagnosis present

## 2016-12-27 DIAGNOSIS — Z881 Allergy status to other antibiotic agents status: Secondary | ICD-10-CM

## 2016-12-27 DIAGNOSIS — R7309 Other abnormal glucose: Secondary | ICD-10-CM | POA: Diagnosis not present

## 2016-12-27 DIAGNOSIS — Z87891 Personal history of nicotine dependence: Secondary | ICD-10-CM

## 2016-12-27 DIAGNOSIS — Z955 Presence of coronary angioplasty implant and graft: Secondary | ICD-10-CM

## 2016-12-27 HISTORY — DX: Acquired absence of left foot: Z89.432

## 2016-12-27 LAB — GLUCOSE, CAPILLARY
GLUCOSE-CAPILLARY: 145 mg/dL — AB (ref 65–99)
GLUCOSE-CAPILLARY: 181 mg/dL — AB (ref 65–99)
Glucose-Capillary: 332 mg/dL — ABNORMAL HIGH (ref 65–99)
Glucose-Capillary: 275 mg/dL — ABNORMAL HIGH (ref 65–99)

## 2016-12-27 LAB — CBC
HCT: 32.8 % — ABNORMAL LOW (ref 39.0–52.0)
Hemoglobin: 10.6 g/dL — ABNORMAL LOW (ref 13.0–17.0)
MCH: 30.9 pg (ref 26.0–34.0)
MCHC: 32.3 g/dL (ref 30.0–36.0)
MCV: 95.6 fL (ref 78.0–100.0)
PLATELETS: 357 10*3/uL (ref 150–400)
RBC: 3.43 MIL/uL — ABNORMAL LOW (ref 4.22–5.81)
RDW: 14.4 % (ref 11.5–15.5)
WBC: 13.1 10*3/uL — AB (ref 4.0–10.5)

## 2016-12-27 LAB — RENAL FUNCTION PANEL
ALBUMIN: 1.7 g/dL — AB (ref 3.5–5.0)
Anion gap: 7 (ref 5–15)
BUN: 26 mg/dL — AB (ref 6–20)
CO2: 28 mmol/L (ref 22–32)
CREATININE: 6.31 mg/dL — AB (ref 0.61–1.24)
Calcium: 8.3 mg/dL — ABNORMAL LOW (ref 8.9–10.3)
Chloride: 96 mmol/L — ABNORMAL LOW (ref 101–111)
GFR calc Af Amer: 9 mL/min — ABNORMAL LOW (ref >60)
GFR, EST NON AFRICAN AMERICAN: 8 mL/min — AB (ref >60)
GLUCOSE: 350 mg/dL — AB (ref 65–99)
PHOSPHORUS: 3 mg/dL (ref 2.5–4.6)
Potassium: 3.4 mmol/L — ABNORMAL LOW (ref 3.5–5.1)
SODIUM: 131 mmol/L — AB (ref 135–145)

## 2016-12-27 MED ORDER — ORAL CARE MOUTH RINSE
15.0000 mL | Freq: Two times a day (BID) | OROMUCOSAL | Status: DC
  Administered 2016-12-28 – 2017-01-05 (×12): 15 mL via OROMUCOSAL

## 2016-12-27 MED ORDER — ONDANSETRON HCL 4 MG PO TABS: 4.0000 mg | ORAL_TABLET | Freq: Four times a day (QID) | ORAL | Status: DC | PRN

## 2016-12-27 MED ORDER — DELFLEX-LC/2.5% DEXTROSE 394 MOSM/L IP SOLN: INTRAPERITONEAL | Status: DC

## 2016-12-27 MED ORDER — PROCHLORPERAZINE 25 MG RE SUPP: 12.5000 mg | Freq: Four times a day (QID) | RECTAL | Status: DC | PRN

## 2016-12-27 MED ORDER — PHENOL 1.4 % MT LIQD: 1.0000 | OROMUCOSAL | Status: DC | PRN

## 2016-12-27 MED ORDER — METHOCARBAMOL 500 MG PO TABS
500.0000 mg | ORAL_TABLET | Freq: Four times a day (QID) | ORAL | Status: DC | PRN
  Administered 2016-12-31 (×2): 500 mg via ORAL
  Filled 2016-12-27 (×2): qty 1

## 2016-12-27 MED ORDER — DELFLEX-LC/1.5% DEXTROSE 344 MOSM/L IP SOLN: INTRAPERITONEAL | Status: DC

## 2016-12-27 MED ORDER — GUAIFENESIN-DM 100-10 MG/5ML PO SYRP: 5.0000 mL | ORAL_SOLUTION | Freq: Four times a day (QID) | ORAL | Status: DC | PRN

## 2016-12-27 MED ORDER — POLYETHYLENE GLYCOL 3350 17 G PO PACK: 17.0000 g | PACK | Freq: Every day | ORAL | Status: DC | PRN

## 2016-12-27 MED ORDER — FUROSEMIDE 40 MG PO TABS
40.0000 mg | ORAL_TABLET | Freq: Two times a day (BID) | ORAL | Status: DC
  Administered 2016-12-27 – 2016-12-31 (×8): 40 mg via ORAL
  Filled 2016-12-27 (×8): qty 1

## 2016-12-27 MED ORDER — RENA-VITE PO TABS
1.0000 | ORAL_TABLET | Freq: Every day | ORAL | Status: DC
  Administered 2016-12-27 – 2017-01-05 (×10): 1 via ORAL
  Filled 2016-12-27 (×11): qty 1

## 2016-12-27 MED ORDER — INSULIN ASPART 100 UNIT/ML ~~LOC~~ SOLN
0.0000 [IU] | Freq: Three times a day (TID) | SUBCUTANEOUS | Status: DC
  Administered 2016-12-27 – 2016-12-28 (×2): 9 [IU] via SUBCUTANEOUS
  Administered 2016-12-28: 2 [IU] via SUBCUTANEOUS
  Administered 2016-12-29: 1 [IU] via SUBCUTANEOUS
  Administered 2016-12-29: 5 [IU] via SUBCUTANEOUS
  Administered 2016-12-29 – 2016-12-30 (×3): 9 [IU] via SUBCUTANEOUS
  Administered 2016-12-30: 2 [IU] via SUBCUTANEOUS
  Administered 2016-12-31: 5 [IU] via SUBCUTANEOUS
  Administered 2017-01-01: 2 [IU] via SUBCUTANEOUS
  Administered 2017-01-01: 5 [IU] via SUBCUTANEOUS
  Administered 2017-01-02: 7 [IU] via SUBCUTANEOUS

## 2016-12-27 MED ORDER — NEPRO/CARBSTEADY PO LIQD
237.0000 mL | Freq: Three times a day (TID) | ORAL | Status: DC
Start: 2016-12-27 — End: 2017-01-06
  Administered 2016-12-27 – 2017-01-05 (×22): 237 mL via ORAL

## 2016-12-27 MED ORDER — GABAPENTIN 100 MG PO CAPS
100.0000 mg | ORAL_CAPSULE | Freq: Once | ORAL | Status: AC
  Administered 2016-12-27: 100 mg via ORAL
  Filled 2016-12-27: qty 1

## 2016-12-27 MED ORDER — OXYCODONE HCL 5 MG PO TABS
5.0000 mg | ORAL_TABLET | ORAL | Status: DC | PRN
  Administered 2016-12-28 – 2016-12-30 (×5): 5 mg via ORAL
  Filled 2016-12-27 (×5): qty 1

## 2016-12-27 MED ORDER — HEPARIN 1000 UNIT/ML FOR PERITONEAL DIALYSIS
INTRAPERITONEAL | Status: DC | PRN
  Filled 2016-12-27: qty 5000

## 2016-12-27 MED ORDER — ALUM & MAG HYDROXIDE-SIMETH 200-200-20 MG/5ML PO SUSP: 30.0000 mL | ORAL | Status: DC | PRN

## 2016-12-27 MED ORDER — LOSARTAN POTASSIUM 25 MG PO TABS
25.0000 mg | ORAL_TABLET | Freq: Every day | ORAL | Status: DC
  Administered 2016-12-27 – 2016-12-30 (×4): 25 mg via ORAL
  Filled 2016-12-27 (×4): qty 1

## 2016-12-27 MED ORDER — TRAMADOL HCL 50 MG PO TABS
50.0000 mg | ORAL_TABLET | Freq: Four times a day (QID) | ORAL | Status: DC | PRN
  Administered 2016-12-31 – 2017-01-01 (×2): 50 mg via ORAL
  Filled 2016-12-27 (×2): qty 1

## 2016-12-27 MED ORDER — MAGIC MOUTHWASH
5.0000 mL | Freq: Four times a day (QID) | ORAL | Status: DC | PRN
  Administered 2016-12-27 – 2017-01-01 (×4): 5 mL via ORAL
  Filled 2016-12-27 (×5): qty 5

## 2016-12-27 MED ORDER — PROCHLORPERAZINE MALEATE 5 MG PO TABS
5.0000 mg | ORAL_TABLET | Freq: Four times a day (QID) | ORAL | Status: DC | PRN
  Administered 2017-01-05 – 2017-01-06 (×2): 5 mg via ORAL
  Filled 2016-12-27 (×2): qty 1

## 2016-12-27 MED ORDER — CALCIUM ACETATE (PHOS BINDER) 667 MG PO CAPS
667.0000 mg | ORAL_CAPSULE | Freq: Three times a day (TID) | ORAL | Status: DC
  Administered 2016-12-27 – 2017-01-06 (×27): 667 mg via ORAL
  Filled 2016-12-27 (×29): qty 1

## 2016-12-27 MED ORDER — INSULIN GLARGINE 100 UNIT/ML ~~LOC~~ SOLN
25.0000 [IU] | Freq: Two times a day (BID) | SUBCUTANEOUS | Status: DC
  Administered 2016-12-27 – 2016-12-28 (×3): 25 [IU] via SUBCUTANEOUS
  Filled 2016-12-27 (×4): qty 0.25

## 2016-12-27 MED ORDER — POTASSIUM CHLORIDE CRYS ER 20 MEQ PO TBCR
20.0000 meq | EXTENDED_RELEASE_TABLET | Freq: Two times a day (BID) | ORAL | Status: DC
  Administered 2016-12-27 – 2017-01-02 (×12): 20 meq via ORAL
  Filled 2016-12-27 (×12): qty 1

## 2016-12-27 MED ORDER — PROCHLORPERAZINE EDISYLATE 5 MG/ML IJ SOLN: 5.0000 mg | Freq: Four times a day (QID) | INTRAMUSCULAR | Status: DC | PRN

## 2016-12-27 MED ORDER — OXYCODONE HCL 5 MG PO TABS
5.0000 mg | ORAL_TABLET | ORAL | Status: DC | PRN
  Administered 2016-12-28 (×2): 5 mg via ORAL
  Administered 2016-12-28: 10 mg via ORAL
  Administered 2016-12-29: 5 mg via ORAL
  Filled 2016-12-27: qty 1
  Filled 2016-12-27: qty 2
  Filled 2016-12-27 (×2): qty 1

## 2016-12-27 MED ORDER — GABAPENTIN 100 MG PO CAPS
100.0000 mg | ORAL_CAPSULE | Freq: Every day | ORAL | Status: DC
  Administered 2016-12-27 – 2016-12-31 (×5): 100 mg via ORAL
  Filled 2016-12-27 (×5): qty 1

## 2016-12-27 MED ORDER — ONDANSETRON HCL 4 MG/2ML IJ SOLN: 4.0000 mg | Freq: Four times a day (QID) | INTRAMUSCULAR | Status: DC | PRN

## 2016-12-27 MED ORDER — GENTAMICIN SULFATE 0.1 % EX CREA
1.0000 | TOPICAL_CREAM | Freq: Every day | CUTANEOUS | Status: DC
Start: 2016-12-28 — End: 2016-12-30

## 2016-12-27 MED ORDER — DOCUSATE SODIUM 100 MG PO CAPS
200.0000 mg | ORAL_CAPSULE | Freq: Two times a day (BID) | ORAL | Status: DC
  Administered 2016-12-27 – 2016-12-29 (×5): 200 mg via ORAL
  Filled 2016-12-27 (×6): qty 2

## 2016-12-27 MED ORDER — INFLUENZA VAC SPLIT HIGH-DOSE 0.5 ML IM SUSY
0.5000 mL | PREFILLED_SYRINGE | INTRAMUSCULAR | Status: AC
  Administered 2016-12-29: 0.5 mL via INTRAMUSCULAR
  Filled 2016-12-27: qty 0.5

## 2016-12-27 MED ORDER — TRAZODONE HCL 50 MG PO TABS: 25.0000 mg | ORAL_TABLET | Freq: Every evening | ORAL | Status: DC | PRN

## 2016-12-27 MED ORDER — HEPARIN 1000 UNIT/ML FOR PERITONEAL DIALYSIS: 2500.0000 [IU] | INTRAMUSCULAR | Status: DC | PRN

## 2016-12-27 MED ORDER — DIPHENHYDRAMINE HCL 12.5 MG/5ML PO ELIX: 12.5000 mg | ORAL_SOLUTION | Freq: Four times a day (QID) | ORAL | Status: DC | PRN

## 2016-12-27 MED ORDER — HEPARIN SODIUM (PORCINE) 5000 UNIT/ML IJ SOLN
5000.0000 [IU] | Freq: Three times a day (TID) | INTRAMUSCULAR | Status: DC
  Administered 2016-12-28 – 2017-01-06 (×29): 5000 [IU] via SUBCUTANEOUS
  Filled 2016-12-27 (×29): qty 1

## 2016-12-27 MED ORDER — SORBITOL 70 % SOLN
30.0000 mL | Freq: Every day | Status: DC | PRN
  Administered 2016-12-29: 30 mL via ORAL
  Filled 2016-12-27: qty 30

## 2016-12-27 MED ORDER — ACETAMINOPHEN 325 MG PO TABS
325.0000 mg | ORAL_TABLET | ORAL | Status: DC | PRN
  Administered 2016-12-29 – 2017-01-05 (×15): 650 mg via ORAL
  Administered 2017-01-05: 325 mg via ORAL
  Administered 2017-01-05 – 2017-01-06 (×3): 650 mg via ORAL
  Filled 2016-12-27 (×19): qty 2

## 2016-12-27 MED ORDER — INSULIN GLARGINE 100 UNIT/ML ~~LOC~~ SOLN
10.0000 [IU] | SUBCUTANEOUS | Status: AC
  Administered 2016-12-27: 10 [IU] via SUBCUTANEOUS
  Filled 2016-12-27: qty 0.1

## 2016-12-27 MED ORDER — CLOPIDOGREL BISULFATE 75 MG PO TABS
75.0000 mg | ORAL_TABLET | Freq: Every day | ORAL | Status: DC
  Administered 2016-12-28 – 2017-01-06 (×10): 75 mg via ORAL
  Filled 2016-12-27 (×10): qty 1

## 2016-12-27 MED ORDER — NITROGLYCERIN 0.4 MG SL SUBL: 0.4000 mg | SUBLINGUAL_TABLET | SUBLINGUAL | Status: DC | PRN

## 2016-12-27 MED ORDER — INSULIN GLARGINE 100 UNIT/ML ~~LOC~~ SOLN
25.0000 [IU] | Freq: Two times a day (BID) | SUBCUTANEOUS | Status: DC
  Filled 2016-12-27: qty 0.25

## 2016-12-27 MED ORDER — TRIAMCINOLONE ACETONIDE 0.1 % MT PSTE: PASTE | Freq: Three times a day (TID) | OROMUCOSAL | Status: DC

## 2016-12-27 MED ORDER — HEPARIN SODIUM (PORCINE) 5000 UNIT/ML IJ SOLN: 5000.0000 [IU] | Freq: Three times a day (TID) | INTRAMUSCULAR | Status: DC

## 2016-12-27 MED ORDER — BISACODYL 10 MG RE SUPP: 10.0000 mg | Freq: Every day | RECTAL | Status: DC | PRN

## 2016-12-27 MED ORDER — SILVER NITRATE-POT NITRATE 75-25 % EX MISC: 1.0000 | Freq: Once | CUTANEOUS | Status: DC | PRN

## 2016-12-27 MED ORDER — ATORVASTATIN CALCIUM 80 MG PO TABS
80.0000 mg | ORAL_TABLET | Freq: Every day | ORAL | Status: DC
  Administered 2016-12-27 – 2017-01-06 (×11): 80 mg via ORAL
  Filled 2016-12-27 (×11): qty 1

## 2016-12-27 MED ORDER — ISOSORBIDE MONONITRATE ER 30 MG PO TB24
30.0000 mg | ORAL_TABLET | Freq: Every day | ORAL | Status: DC
  Administered 2016-12-28 – 2017-01-06 (×10): 30 mg via ORAL
  Filled 2016-12-27 (×10): qty 1

## 2016-12-27 MED ORDER — TRIPLE ANTIBIOTIC 3.5-400-5000 EX OINT: 1.0000 "application " | TOPICAL_OINTMENT | Freq: Once | CUTANEOUS | Status: DC | PRN

## 2016-12-27 MED ORDER — INSULIN GLARGINE 100 UNIT/ML ~~LOC~~ SOLN
35.0000 [IU] | Freq: Two times a day (BID) | SUBCUTANEOUS | Status: DC
  Filled 2016-12-27: qty 0.35

## 2016-12-27 MED ORDER — HEPARIN 1000 UNIT/ML FOR PERITONEAL DIALYSIS: INTRAPERITONEAL | Status: DC | PRN

## 2016-12-27 MED ORDER — CARVEDILOL 12.5 MG PO TABS
12.5000 mg | ORAL_TABLET | Freq: Two times a day (BID) | ORAL | Status: DC
  Administered 2016-12-27 – 2017-01-03 (×14): 12.5 mg via ORAL
  Filled 2016-12-27 (×14): qty 1

## 2016-12-27 MED ORDER — INSULIN ASPART 100 UNIT/ML ~~LOC~~ SOLN
8.0000 [IU] | Freq: Three times a day (TID) | SUBCUTANEOUS | Status: DC
  Administered 2016-12-28: 8 [IU] via SUBCUTANEOUS

## 2016-12-27 MED ORDER — ASPIRIN EC 81 MG PO TBEC
81.0000 mg | DELAYED_RELEASE_TABLET | Freq: Every day | ORAL | Status: DC
  Administered 2016-12-28 – 2017-01-06 (×10): 81 mg via ORAL
  Filled 2016-12-27 (×10): qty 1

## 2016-12-27 MED ORDER — FLEET ENEMA 7-19 GM/118ML RE ENEM: 1.0000 | ENEMA | Freq: Once | RECTAL | Status: DC | PRN

## 2016-12-27 MED ORDER — PANTOPRAZOLE SODIUM 40 MG PO TBEC
40.0000 mg | DELAYED_RELEASE_TABLET | Freq: Every day | ORAL | Status: DC
  Administered 2016-12-28 – 2017-01-06 (×10): 40 mg via ORAL
  Filled 2016-12-27 (×10): qty 1

## 2016-12-27 MED ORDER — VITAMIN D (ERGOCALCIFEROL) 1.25 MG (50000 UNIT) PO CAPS: 50000.0000 [IU] | ORAL_CAPSULE | ORAL | Status: DC

## 2016-12-27 NOTE — H&P (Signed)
Physical Medicine and Rehabilitation Admission H&P    CC: Debility post left transmetatarsal amputations and multiple recent medical issues.    HPI:  [name] is a 76 y.o. male with history of COPD, T2DM, CAD s/p ICD, ESRD- on PD,  PAD with critical limb ischemia with non-healing LLE wound who was admitted for right femoral pseudoaneurysm repair and resection of Left 3rd MT head on 12/15/16 by Dr. Donzetta Matters. He failed attempts at limb salvage and hospital course complicated by N/V, leucocytosis and fever likely due to sepsis. GB ultrasound revealed GB polyp without acute cholecystitis. He has had issues with right groin wound dehiscence of wound with gangrenous changes left foot. He  required left transmetatarsal amputation with VAC placement by Dr. Sharol Given on 9/14. Post op NWB LLE and antibiotics discontinued.   Dr. Constance Holster consulted for input on sore throat, difficulty swallowing and oral pharyngeal ulcerations. He recommended supportive care for viral v/s antibiotics related ulcers. Right groin with small hematoma and decrease in erythema.  CIR recommended due to deficits in mobility and decreased ability to carry out ADL tasks.    Review of Systems  Constitutional: Positive for malaise/fatigue. Negative for chills and fever.  HENT: Negative for hearing loss and tinnitus.   Eyes: Negative for blurred vision and double vision.  Respiratory: Negative for cough and shortness of breath.   Cardiovascular: Negative for chest pain and palpitations.  Gastrointestinal: Negative for constipation, heartburn and vomiting.       Sore mouth  Genitourinary: Negative for dysuria and urgency.  Musculoskeletal: Positive for myalgias.       Pain BLE--constant pain left foot (surgical)  and right groin pain  Neurological: Negative for dizziness and headaches. Sensory change: pain left foot.  Psychiatric/Behavioral: Negative for memory loss. The patient is nervous/anxious. The patient does not have insomnia.        Past Medical History:  Diagnosis Date  . AICD (automatic cardioverter/defibrillator) present   . CHF (congestive heart failure) ([place])   . COPD (chronic obstructive pulmonary disease) ([place])   . Coronary artery disease    CABG 2011  . Diabetes mellitus without complication ([place])   . ESRD on peritoneal dialysis [facility])    Started PD Dec 2016 in [place]. Nephrology is in [place].   [name] History of peritonitis    PD cath related peritonitis in April 2017  . Hypertension   . Peripheral vascular disease ([place])   . Sjogren's syndrome ([place])    Per pt's wife, was diagnosed in 2016 during w/u for cause of renal failure prior to starting dialysis.  He had a rash on his chest apparently prompting this w/u.  Never had renal bx.  He was referred to a rheum MD in [place] and per the wife he was treated with MTX and other medications but had side effects to "all of it" and isn't taking anything for this as of Jun 2017.   . Stroke [facility])    Past Surgical History:  Procedure Laterality Date  . ABDOMINAL AORTOGRAM W/LOWER EXTREMITY N/A 11/29/2016   Procedure: ABDOMINAL AORTOGRAM W/LOWER EXTREMITY;  Surgeon: Serafina Mitchell, MD;  Location: [facility];  Service: Cardiovascular;  Laterality: N/A;  . AMPUTATION Left 11/29/2016   Procedure: LEFT THIRD TOE AMPUTATION;  Surgeon: Serafina Mitchell, MD;  Location: [facility] OR;  Service: Vascular;  Laterality: Left;  . AMPUTATION Left 12/23/2016   Procedure: Left Transmetatarsal Amputation;  Surgeon: Newt Minion, MD;  Location: [place];  Service: Orthopedics;  Laterality: Left;  . ANGIOPLASTY  12/15/2016   Procedure: BALLOON ANGIOPLASTY;  Surgeon: Waynetta Sandy, MD;  Location: [place];  Service: Vascular;;  . cardiac catherization with stent placement  2017  . CORONARY ARTERY BYPASS GRAFT  2011  . FALSE ANEURYSM REPAIR Right 12/15/2016   Procedure: REPAIR FALSE ANEURYSM;  Surgeon: Waynetta Sandy, MD;  Location: [place];  Service: Vascular;   Laterality: Right;  . IRRIGATION AND DEBRIDEMENT FOOT Left 12/15/2016   Procedure: IRRIGATION AND DEBRIDEMENT FOOT;  Surgeon: Waynetta Sandy, MD;  Location: [place];  Service: Vascular;  Laterality: Left;  . LOWER EXTREMITY ANGIOGRAM Left 12/15/2016   Procedure: LOWER EXTREMITY ANGIOGRAM; PEDAL ACCESS;  Surgeon: Waynetta Sandy, MD;  Location: [place];  Service: Vascular;  Laterality: Left;  . PERIPHERAL VASCULAR BALLOON ANGIOPLASTY  11/29/2016   Procedure: PERIPHERAL VASCULAR BALLOON ANGIOPLASTY;  Surgeon: Serafina Mitchell, MD;  Location: [place] [facility];  Service: Cardiovascular;;  LT Peroneal AT attempted unsuccessful    Family History  Problem Relation Age of Onset  . Heart disease Father     Social History:  Married. Retired Dealer. Wife supportive and can provide assistance after discharge. He  reports that he has quit smoking 25 years ago. He has never used smokeless tobacco. He reports that he does not drink alcohol or use drugs.    Allergies  Allergen Reactions  . Lisinopril Hives  . Methotrexate Derivatives Other (See Comments)    blisters  . Sulfa Antibiotics Hives  . Tape Hives  . Vancomycin Other (See Comments)    Blisters     Medications Prior to Admission  Medication Sig Dispense Refill  . amoxicillin (AMOXIL) 500 MG capsule Take 2,000 mg by mouth See admin instructions. 1 hour prior to dental procedures    . aspirin EC 81 MG tablet Take 81 mg by mouth daily.    [name] atorvastatin (LIPITOR) 80 MG tablet Take 80 mg by mouth daily at 6 PM.    . calcium acetate (PHOSLO) 667 MG capsule Take 667 mg by mouth 3 (three) times daily with meals.    . carvedilol (COREG) 12.5 MG tablet Take 12.5 mg by mouth 2 (two) times daily with a meal.    . clopidogrel (PLAVIX) 75 MG tablet Take 75 mg by mouth daily.    [name] docusate sodium (COLACE) 100 MG capsule Take 100 mg by mouth 2 (two) times daily.     . furosemide (LASIX) 40 MG tablet Take 40 mg by mouth 2 (two) times  daily.    . insulin NPH Human (HUMULIN N,NOVOLIN N) 100 UNIT/ML injection Inject 70 Units into the skin at bedtime.    . insulin regular (NOVOLIN R,HUMULIN R) 100 units/mL injection Inject 20 Units into the skin 3 (three) times daily before meals.    . isosorbide mononitrate (IMDUR) 30 MG 24 hr tablet Take 30 mg by mouth daily.    [name] losartan (COZAAR) 25 MG tablet Take 25 mg by mouth at bedtime.     . nitroGLYCERIN (NITROSTAT) 0.4 MG SL tablet Place 0.4 mg under the tongue every 5 (five) minutes as needed for chest pain.    [name] ondansetron (ZOFRAN) 4 MG tablet Take 4 mg by mouth every 6 (six) hours as needed for nausea or vomiting.    [name] oxyCODONE-acetaminophen (PERCOCET/ROXICET) 5-325 MG tablet Take 1 tablet by mouth every 6 (six) hours as needed for severe pain. 20 tablet 0  . Vitamin D, Ergocalciferol, (DRISDOL)  50000 units CAPS capsule Take 50,000 Units by mouth every 30 (thirty) days.       Drug Regimen Review  Drug regimen was reviewed and remains appropriate with no significant issues identified  Home: Home Living Family/patient expects to be discharged to:: Private residence Living Arrangements: Spouse/significant other Available Help at Discharge: Family, Available 24 hours/day Type of Home: Mobile home Home Access: Stairs to enter [employer] of Steps: 1 Entrance Stairs-Rails: Left Home Layout: One level Bathroom Shower/Tub: Gaffer, Charity fundraiser: Standard Home Equipment: Environmental consultant - 4 wheels, Radio producer - quad, Wheelchair - manual   Functional History: Prior Function Level of Independence: Needs assistance Gait / Transfers Assistance Needed: uses [name], RW ADL's / Homemaking Assistance Needed: A for socks, can bath self in shower  Functional Status:  Mobility: Bed Mobility Overal bed mobility: Needs Assistance Bed Mobility: Sit to Supine Supine to sit: Min assist, HOB elevated Sit to supine: Min assist, +2 for safety/equipment General bed mobility  comments: Min assist +2 for linesto manage B LE.  Transfers Overall transfer level: Needs assistance Equipment used: Rolling walker (2 wheeled), Ambulation equipment used Transfers: Lateral/Scoot Transfers Sit to Stand: Min assist, +2 safety/equipment Stand pivot transfers: Min assist, From elevated surface  Lateral/Scoot Transfers: Min assist, Mod assist General transfer comment: Min assist for transfer to [facility]; mod assist for transfer back to chair. Min assist for bed to chair. Lateral scoot for all three. Ambulation/Gait Ambulation/Gait assistance: +2 safety/equipment, Min assist Ambulation Distance (Feet): 40 Feet Assistive device: Rolling walker (2 wheeled) Gait Pattern/deviations: Step-to pattern, Decreased stance time - left, Decreased step length - right, Decreased weight shift to left, Trunk flexed General Gait Details: cues for posture and proximity of RW to aid in limiting weight bearing on L LE; pt with less difficulty maintaining weight mostly on L heel with tennis shoe donned on R Gait velocity: decreased    ADL: ADL Overall ADL's : Needs assistance/impaired Eating/Feeding: Sitting, Independent Eating/Feeding Details (indicate cue type and reason): Pt can independently feed himself but was having his wife feed him on OT arrival.  Grooming: Set up, Sitting Grooming Details (indicate cue type and reason): Becomes winded when completing seated ADL tasks.  Upper Body Bathing: Supervision/ safety, Sitting Upper Body Bathing Details (indicate cue type and reason): Fatigues easily Lower Body Bathing: Maximal assistance Lower Body Bathing Details (indicate cue type and reason): min A +2 sit<>stand Upper Body Dressing : Set up, Sitting Lower Body Dressing: Maximal assistance, Sit to/from stand Lower Body Dressing Details (indicate cue type and reason): Able to assist therapist to doff [place] shoe Toilet Transfer: Minimal assistance (lateral scoot with drop arm) Toilet Transfer  Details (indicate cue type and reason): lateral scoot from drop arm to [facility] Toileting- Clothing Manipulation and Hygiene: Total assistance Toileting - Clothing Manipulation Details (indicate cue type and reason): Pt able to complete chair push-up to create space for pericare.  General ADL Comments: Session focused on transfers to and from [facility]. Initiated education concerning AE for LB ADL and pt needs reinforcement.   Cognition: Cognition Overall Cognitive Status: Within Functional Limits for tasks assessed Orientation Level: Oriented X4 Cognition Arousal/Alertness: Awake/alert Behavior During Therapy: WFL for tasks assessed/performed Overall Cognitive Status: Within Functional Limits for tasks assessed   Blood pressure (!) 124/48, pulse 79, temperature (!) 97.5 F (36.4 C), temperature source Oral, resp. rate 17, height '5\' 9"'  (1.753 m), weight 89.2 kg (196 lb 10.4 oz), SpO2 99 %. Physical Exam  Nursing note and vitals reviewed.  Constitutional: He is oriented to person, place, and time. He appears well-developed and well-nourished. No distress.  HENT:  Head: Normocephalic and atraumatic.  Mouth/Throat: Oropharynx is clear and moist.  Eyes: Pupils are equal, round, and reactive to light. Conjunctivae and EOM are normal.  Neck: Normal range of motion. Neck supple.  Cardiovascular: Normal rate and regular rhythm.   Murmur heard. Respiratory: Effort normal and breath sounds normal. No stridor. No respiratory distress. He has no wheezes.  GI: Soft. Bowel sounds are normal. He exhibits no distension. There is no tenderness.  Musculoskeletal: He exhibits tenderness. He exhibits no edema.  Right groin and left thigh tender.    Neurological: He is alert and oriented to person, place, and time.  UE 5/5 prox to distal. LLE 2+HF, KE, ADF/PF grossly 3/5 (vac/op-site). RLE: 3+ to 4- HF, KE and ADF/PF. No gross sensory loss. Fair insight and awareness. Followed all simple commands.   Skin: Skin is  warm and dry. He is not diaphoretic.  Left TMA Vac in place. Right groin wound packed with moderate sero-sang discharge. Mild area of breakdown between 4th and 5th toes of right foot. PD sites dressed  Psychiatric: He has a normal mood and affect. His behavior is normal. Judgment and thought content normal.    Results for orders placed or performed during the hospital encounter of 12/15/16 (from the past 48 hour(s))  Glucose, capillary     Status: Abnormal   Collection Time: 12/25/16  3:59 PM  Result Value Ref Range   Glucose-Capillary 259 (H) 65 - 99 mg/dL   Comment 1 Notify RN    Comment 2 Document in Chart   Glucose, capillary     Status: Abnormal   Collection Time: 12/25/16  9:14 PM  Result Value Ref Range   Glucose-Capillary 213 (H) 65 - 99 mg/dL   Comment 1 Document in Chart   Glucose, capillary     Status: Abnormal   Collection Time: 12/26/16  6:05 AM  Result Value Ref Range   Glucose-Capillary 356 (H) 65 - 99 mg/dL  Glucose, capillary     Status: Abnormal   Collection Time: 12/26/16 11:35 AM  Result Value Ref Range   Glucose-Capillary 440 (H) 65 - 99 mg/dL   Comment 1 Notify RN   Glucose, capillary     Status: Abnormal   Collection Time: 12/26/16  4:29 PM  Result Value Ref Range   Glucose-Capillary 202 (H) 65 - 99 mg/dL   Comment 1 Notify RN    Comment 2 Document in Chart   Glucose, capillary     Status: Abnormal   Collection Time: 12/26/16  9:04 PM  Result Value Ref Range   Glucose-Capillary 217 (H) 65 - 99 mg/dL  Glucose, capillary     Status: Abnormal   Collection Time: 12/27/16  6:01 AM  Result Value Ref Range   Glucose-Capillary 332 (H) 65 - 99 mg/dL  Renal function panel     Status: Abnormal   Collection Time: 12/27/16  9:00 AM  Result Value Ref Range   Sodium 131 (L) 135 - 145 mmol/L   Potassium 3.4 (L) 3.5 - 5.1 mmol/L   Chloride 96 (L) 101 - 111 mmol/L   CO2 28 22 - 32 mmol/L   Glucose, Bld 350 (H) 65 - 99 mg/dL   BUN 26 (H) 6 - 20 mg/dL   Creatinine,  Ser 6.31 (H) 0.61 - 1.24 mg/dL   Calcium 8.3 (L) 8.9 - 10.3 mg/dL   Phosphorus 3.0  2.5 - 4.6 mg/dL   Albumin 1.7 (L) 3.5 - 5.0 g/dL   GFR calc non Af Amer 8 (L) >60 mL/min   GFR calc Af Amer 9 (L) >60 mL/min    Comment: (NOTE) The eGFR has been calculated using the CKD EPI equation. This calculation has not been validated in all clinical situations. eGFR's persistently <60 mL/min signify possible Chronic Kidney Disease.    Anion gap 7 5 - 15  CBC     Status: Abnormal   Collection Time: 12/27/16  9:00 AM  Result Value Ref Range   WBC 13.1 (H) 4.0 - 10.5 K/uL   RBC 3.43 (L) 4.22 - 5.81 MIL/uL   Hemoglobin 10.6 (L) 13.0 - 17.0 g/dL   HCT 32.8 (L) 39.0 - 52.0 %   MCV 95.6 78.0 - 100.0 fL   MCH 30.9 26.0 - 34.0 pg   MCHC 32.3 30.0 - 36.0 g/dL   RDW 14.4 11.5 - 15.5 %   Platelets 357 150 - 400 K/uL  Glucose, capillary     Status: Abnormal   Collection Time: 12/27/16 10:56 AM  Result Value Ref Range   Glucose-Capillary 275 (H) 65 - 99 mg/dL   No results found.     Medical Problem List and Plan: 1.  Functional and mobility deficits  secondary to PAD requiring left TMA, right groin wound, multiple medical  -admit to inpatient rehab 2.  DVT Prophylaxis/Anticoagulation: Pharmaceutical: Heparin 3. Pain Management: continue oxycodone prn.  4. Mood: team to provide ego support. LCSW to follow for evaluation and support.  5. Neuropsych: This patient is capable of making decisions on his own behalf. 6. Skin/Wound Care: continue VAC left foot. Monitor right groin wound--  -continue dry dressing to groin 7. Fluids/Electrolytes/Nutrition: Monitor I/O. PD in evenings after therapy. 8. T2DM: used NPH at nights with  novolog 20 units tid at home. Monitor BS ac/hs. Currently on Lantus being titrated upwards for tighter control--did not get lantus this am. Continue SSI for elevated BS. Question resume home regimen as intake improves 9. Constipation: Increase colace to home regimen with sorbitol  daily prn.   10 OSA: has been compliant with BIPAP at nights.  11. Leucocytosis:  Continue to monitor groin wound for healing.  12. Anemia of chronic disease:  H/H stable and at goal. Continue to monitor.  13. CADs/p CABG: On ASA/plavix and lipitor 14. History of CHF: Monitor weight daily--fluid manged with PD. OnASA, lipitor, coreg and lasix bid.  15. ESRD: On PD daily at nights. Tolerating it well at present     Post Admission Physician Evaluation: 1. Functional deficits secondary  to left TMA, right groin wound/multiple medical. 2. Patient is admitted to receive collaborative, interdisciplinary care between the physiatrist, rehab nursing staff, and therapy team. 3. Patient's level of medical complexity and substantial therapy needs in context of that medical necessity cannot be provided at a lesser intensity of care such as a SNF. 4. Patient has experienced substantial functional loss from his/her baseline which was documented above under the "Functional History" and "Functional Status" headings.  Judging by the patient's diagnosis, physical exam, and functional history, the patient has potential for functional progress which will result in measurable gains while on inpatient rehab.  These gains will be of substantial and practical use upon discharge  in facilitating mobility and self-care at the household level. 5. Physiatrist will provide 24 hour management of medical needs as well as oversight of the therapy plan/treatment and provide guidance as appropriate regarding  the interaction of the two. 6. The Preadmission Screening has been reviewed and patient status is unchanged unless otherwise stated above. 7. 24 hour rehab nursing will assist with bowel management, safety, skin/wound care, disease management, medication administration, pain management and patient education  and help integrate therapy concepts, techniques,education, etc. 8. PT will assess and treat for/with: Lower extremity  strength, range of motion, stamina, balance, functional mobility, safety, adaptive techniques and equipment, NMR, pain control, vac-work arounds, family ed.   Goals are: mod I to supervision. 9. OT will assess and treat for/with: ADL's, functional mobility, safety, upper extremity strength, adaptive techniques and equipment, NMR, vac mgt, family ed.   Goals are: mod I to supervision. Therapy may not yet proceed with showering this patient. 10. SLP will assess and treat for/with: n/a.  Goals are: n/a. 11. Case Management and Social Worker will assess and treat for psychological issues and discharge planning. 12. Team conference will be held weekly to assess progress toward goals and to determine barriers to discharge. 13. Patient will receive at least 3 hours of therapy per day at least 5 days per week. 14. ELOS: 14-17 days       15. Prognosis:  excellent     Meredith Staggers, MD, [facility] 12/27/2016  Bary Leriche, Hershal Coria 12/27/2016

## 2016-12-27 NOTE — H&P (Signed)
Physical Medicine and Rehabilitation Admission H&P    CC: Debility post left transmetatarsal amputations and multiple recent medical issues.    HPI:  Joshua Ravi Comptonis a 76 y.o.malewith history of COPD, T2DM, CAD s/p ICD, ESRD- on PD, PAD with critical limb ischemia with non-healing LLE wound who was admitted for right femoral pseudoaneurysm repair and resection of Left 3rd MT head on 12/15/16 by Dr. Donzetta Matters. He failed attempts at limb salvage and hospital course complicated by N/V, leucocytosis and fever likely due to sepsis. GB ultrasound revealed GB polyp without acute cholecystitis. He has had issues with right groin wound dehiscence of wound with gangrenous changes left foot. 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 Sharol Given on 9/14. Post op NWB LLE and antibiotics discontinued.   Dr. Constance Holster consulted for input on sore throat, difficulty swallowing and oral pharyngeal ulcerations. He recommended supportive care for viral v/s antibiotics related ulcers. Right groin with small hematoma and decrease in erythema.  CIR recommended due to deficits in mobility and decreased ability to carry out ADL tasks.    Review of Systems  Constitutional: Positive for malaise/fatigue. Negative for chills and fever.  HENT: Negative for hearing loss and tinnitus.   Eyes: Negative for blurred vision and double vision.  Respiratory: Negative for cough and shortness of breath.   Cardiovascular: Negative for chest pain and palpitations.  Gastrointestinal: Negative for constipation, heartburn and vomiting.       Sore mouth  Genitourinary: Negative for dysuria and urgency.  Musculoskeletal: Positive for myalgias.       Pain BLE--constant pain left foot (surgical)  and right groin pain  Neurological: Negative for dizziness and headaches. Sensory change: pain left foot.  Psychiatric/Behavioral: Negative for memory loss. The patient is nervous/anxious. The patient does not have  insomnia.           Past Medical History:  Diagnosis Date  . AICD (automatic cardioverter/defibrillator) present   . CHF (congestive heart failure) ([place])   . COPD (chronic obstructive pulmonary disease) ([place])   . Coronary artery disease    CABG 2011  . Diabetes mellitus without complication ([place])   . ESRD on peritoneal dialysis [facility])    Started PD Dec 2016 in [place]. Nephrology is in [place].   [name] History of peritonitis    PD cath related peritonitis in April 2017  . Hypertension   . Peripheral vascular disease ([place])   . Sjogren's syndrome ([place])    Per pt's wife, was diagnosed in 2016 during w/u for cause of renal failure prior to starting dialysis.  He had a rash on his chest apparently prompting this w/u.  Never had renal bx.  He was referred to a rheum MD in [place] and per the wife he was treated with MTX and other medications but had side effects to "all of it" and isn't taking anything for this as of Jun 2017.   . Stroke [facility])         Past Surgical History:  Procedure Laterality Date  . ABDOMINAL AORTOGRAM W/LOWER EXTREMITY N/A 11/29/2016   Procedure: ABDOMINAL AORTOGRAM W/LOWER EXTREMITY;  Surgeon: Serafina Mitchell, MD;  Location: [place] CV LAB;  Service: Cardiovascular;  Laterality: N/A;  . AMPUTATION Left 11/29/2016   Procedure: LEFT THIRD TOE AMPUTATION;  Surgeon: Serafina Mitchell, MD;  Location: [facility] OR;  Service: Vascular;  Laterality: Left;  . AMPUTATION Left 12/23/2016   Procedure: Left Transmetatarsal Amputation;  Surgeon: Newt Minion, MD;  Location: [place];  Service: Orthopedics;  Laterality: Left;  . ANGIOPLASTY  12/15/2016   Procedure: BALLOON ANGIOPLASTY;  Surgeon: Waynetta Sandy, MD;  Location: [place];  Service: Vascular;;  . cardiac catherization with stent placement  2017  . CORONARY ARTERY BYPASS GRAFT  2011  . FALSE ANEURYSM REPAIR Right 12/15/2016   Procedure: REPAIR FALSE ANEURYSM;  Surgeon: Waynetta Sandy, MD;  Location: [place];  Service: Vascular;  Laterality: Right;  . IRRIGATION AND DEBRIDEMENT FOOT Left 12/15/2016   Procedure: IRRIGATION AND DEBRIDEMENT FOOT;  Surgeon: Waynetta Sandy, MD;  Location: [place];  Service: Vascular;  Laterality: Left;  . LOWER EXTREMITY ANGIOGRAM Left 12/15/2016   Procedure: LOWER EXTREMITY ANGIOGRAM; PEDAL ACCESS;  Surgeon: Waynetta Sandy, MD;  Location: [place];  Service: Vascular;  Laterality: Left;  . PERIPHERAL VASCULAR BALLOON ANGIOPLASTY  11/29/2016   Procedure: PERIPHERAL VASCULAR BALLOON ANGIOPLASTY;  Surgeon: Serafina Mitchell, MD;  Location: [place] [facility] LAB;  Service: Cardiovascular;;  LT Peroneal AT attempted unsuccessful         Family History  Problem Relation Age of Onset  . Heart disease Father     Social History:  Married. Retired Dealer. Wife supportive and can provide assistance after discharge. He reports that he has quit smoking 25 years ago. He has never used smokeless tobacco. He reports that he does not drink alcohol or use drugs.         Allergies  Allergen Reactions  . Lisinopril Hives  . Methotrexate Derivatives Other (See Comments)    blisters  . Sulfa Antibiotics Hives  . Tape Hives  . Vancomycin Other (See Comments)    Blisters           Medications Prior to Admission  Medication Sig Dispense Refill  . amoxicillin (AMOXIL) 500 MG capsule Take 2,000 mg by mouth See admin instructions. 1 hour prior to dental procedures    . aspirin EC 81 MG tablet Take 81 mg by mouth daily.    [name] atorvastatin (LIPITOR) 80 MG tablet Take 80 mg by mouth daily at 6 PM.    . calcium acetate (PHOSLO) 667 MG capsule Take 667 mg by mouth 3 (three) times daily with meals.    . carvedilol (COREG) 12.5 MG tablet Take 12.5 mg by mouth 2 (two) times daily with a meal.    . clopidogrel (PLAVIX) 75 MG tablet Take 75 mg by mouth daily.    [name] docusate sodium (COLACE) 100 MG capsule Take 100 mg  by mouth 2 (two) times daily.     . furosemide (LASIX) 40 MG tablet Take 40 mg by mouth 2 (two) times daily.    . insulin NPH Human (HUMULIN N,NOVOLIN N) 100 UNIT/ML injection Inject 70 Units into the skin at bedtime.    . insulin regular (NOVOLIN R,HUMULIN R) 100 units/mL injection Inject 20 Units into the skin 3 (three) times daily before meals.    . isosorbide mononitrate (IMDUR) 30 MG 24 hr tablet Take 30 mg by mouth daily.    [name] losartan (COZAAR) 25 MG tablet Take 25 mg by mouth at bedtime.     . nitroGLYCERIN (NITROSTAT) 0.4 MG SL tablet Place 0.4 mg under the tongue every 5 (five) minutes as needed for chest pain.    [name] ondansetron (ZOFRAN) 4 MG tablet Take 4 mg by mouth every 6 (six) hours as needed for nausea or vomiting.    [name] oxyCODONE-acetaminophen (PERCOCET/ROXICET) 5-325 MG tablet Take 1 tablet by  mouth every 6 (six) hours as needed for severe pain. 20 tablet 0  . Vitamin D, Ergocalciferol, (DRISDOL) 50000 units CAPS capsule Take 50,000 Units by mouth every 30 (thirty) days.       Drug Regimen Review  Drug regimen was reviewed and remains appropriate with no significant issues identified  Home: Home Living Family/patient expects to be discharged to:: Private residence Living Arrangements: Spouse/significant other Available Help at Discharge: Family, Available 24 hours/day Type of Home: Mobile home Home Access: Stairs to enter [employer] of Steps: 1 Entrance Stairs-Rails: Left Home Layout: One level Bathroom Shower/Tub: Gaffer, Charity fundraiser: Standard Home Equipment: Environmental consultant - 4 wheels, Radio producer - quad, Wheelchair - manual   Functional History: Prior Function Level of Independence: Needs assistance Gait / Transfers Assistance Needed: uses [name], RW ADL's / Homemaking Assistance Needed: A for socks, can bath self in shower  Functional Status:  Mobility: Bed Mobility Overal bed mobility: Needs Assistance Bed Mobility: Sit to  Supine Supine to sit: Min assist, HOB elevated Sit to supine: Min assist, +2 for safety/equipment General bed mobility comments: Min assist +2 for linesto manage B LE.  Transfers Overall transfer level: Needs assistance Equipment used: Rolling walker (2 wheeled), Ambulation equipment used Transfers: Lateral/Scoot Transfers Sit to Stand: Min assist, +2 safety/equipment Stand pivot transfers: Min assist, From elevated surface  Lateral/Scoot Transfers: Min assist, Mod assist General transfer comment: Min assist for transfer to [facility]; mod assist for transfer back to chair. Min assist for bed to chair. Lateral scoot for all three. Ambulation/Gait Ambulation/Gait assistance: +2 safety/equipment, Min assist Ambulation Distance (Feet): 40 Feet Assistive device: Rolling walker (2 wheeled) Gait Pattern/deviations: Step-to pattern, Decreased stance time - left, Decreased step length - right, Decreased weight shift to left, Trunk flexed General Gait Details: cues for posture and proximity of RW to aid in limiting weight bearing on L LE; pt with less difficulty maintaining weight mostly on L heel with tennis shoe donned on R Gait velocity: decreased  ADL: ADL Overall ADL's : Needs assistance/impaired Eating/Feeding: Sitting, Independent Eating/Feeding Details (indicate cue type and reason): Pt can independently feed himself but was having his wife feed him on OT arrival.  Grooming: Set up, Sitting Grooming Details (indicate cue type and reason): Becomes winded when completing seated ADL tasks.  Upper Body Bathing: Supervision/ safety, Sitting Upper Body Bathing Details (indicate cue type and reason): Fatigues easily Lower Body Bathing: Maximal assistance Lower Body Bathing Details (indicate cue type and reason): min A +2 sit<>stand Upper Body Dressing : Set up, Sitting Lower Body Dressing: Maximal assistance, Sit to/from stand Lower Body Dressing Details (indicate cue type and reason): Able to  assist therapist to doff Darco shoe Toilet Transfer: Minimal assistance (lateral scoot with drop arm) Toilet Transfer Details (indicate cue type and reason): lateral scoot from drop arm to [facility] Toileting- Clothing Manipulation and Hygiene: Total assistance Toileting - Clothing Manipulation Details (indicate cue type and reason): Pt able to complete chair push-up to create space for pericare.  General ADL Comments: Session focused on transfers to and from [facility]. Initiated education concerning AE for LB ADL and pt needs reinforcement.   Cognition: Cognition Overall Cognitive Status: Within Functional Limits for tasks assessed Orientation Level: Oriented X4 Cognition Arousal/Alertness: Awake/alert Behavior During Therapy: WFL for tasks assessed/performed Overall Cognitive Status: Within Functional Limits for tasks assessed   Blood pressure (!) 124/48, pulse 79, temperature (!) 97.5 F (36.4 C), temperature source Oral, resp. rate 17, height _0  (1.753 m), weight  89.2 kg (196 lb 10.4 oz), SpO2 99 %. Physical Exam  Nursing note and vitals reviewed. Constitutional: He is oriented to person, place, and time. He appears well-developed and well-nourished. No distress.  HENT:  Head: Normocephalic and atraumatic.  Mouth/Throat: Oropharynx is clear and moist.  Eyes: Pupils are equal, round, and reactive to light. Conjunctivae and EOM are normal.  Neck: Normal range of motion. Neck supple.  Cardiovascular: Normal rate and regular rhythm.   Murmur heard. Respiratory: Effort normal and breath sounds normal. No stridor. No respiratory distress. He has no wheezes.  GI: Soft. Bowel sounds are normal. He exhibits no distension. There is no tenderness.  Musculoskeletal: He exhibits tenderness. He exhibits no edema.  Right groin and left thigh tender.    Neurological: He is alert and oriented to person, place, and time.  UE 5/5 prox to distal. LLE 2+HF, KE, ADF/PF grossly 3/5 (vac/op-site). RLE: 3+ to  4- HF, KE and ADF/PF. No gross sensory loss. Fair insight and awareness. Followed all simple commands.   Skin: Skin is warm and dry. He is not diaphoretic.  Left TMA Vac in place. Right groin wound packed with moderate sero-sang discharge. Mild area of breakdown between 4th and 5th toes of right foot. PD sites dressed  Psychiatric: He has a normal mood and affect. His behavior is normal. Judgment and thought content normal.    Lab Results Last 48 Hours        Results for orders placed or performed during the hospital encounter of 12/15/16 (from the past 48 hour(s))  Glucose, capillary     Status: Abnormal   Collection Time: 12/25/16  3:59 PM  Result Value Ref Range   Glucose-Capillary 259 (H) 65 - 99 mg/dL   Comment 1 Notify RN    Comment 2 Document in Chart   Glucose, capillary     Status: Abnormal   Collection Time: 12/25/16  9:14 PM  Result Value Ref Range   Glucose-Capillary 213 (H) 65 - 99 mg/dL   Comment 1 Document in Chart   Glucose, capillary     Status: Abnormal   Collection Time: 12/26/16  6:05 AM  Result Value Ref Range   Glucose-Capillary 356 (H) 65 - 99 mg/dL  Glucose, capillary     Status: Abnormal   Collection Time: 12/26/16 11:35 AM  Result Value Ref Range   Glucose-Capillary 440 (H) 65 - 99 mg/dL   Comment 1 Notify RN   Glucose, capillary     Status: Abnormal   Collection Time: 12/26/16  4:29 PM  Result Value Ref Range   Glucose-Capillary 202 (H) 65 - 99 mg/dL   Comment 1 Notify RN    Comment 2 Document in Chart   Glucose, capillary     Status: Abnormal   Collection Time: 12/26/16  9:04 PM  Result Value Ref Range   Glucose-Capillary 217 (H) 65 - 99 mg/dL  Glucose, capillary     Status: Abnormal   Collection Time: 12/27/16  6:01 AM  Result Value Ref Range   Glucose-Capillary 332 (H) 65 - 99 mg/dL  Renal function panel     Status: Abnormal   Collection Time: 12/27/16  9:00 AM  Result Value Ref Range   Sodium 131 (L) 135 - 145  mmol/L   Potassium 3.4 (L) 3.5 - 5.1 mmol/L   Chloride 96 (L) 101 - 111 mmol/L   CO2 28 22 - 32 mmol/L   Glucose, Bld 350 (H) 65 - 99 mg/dL   BUN  26 (H) 6 - 20 mg/dL   Creatinine, Ser 6.31 (H) 0.61 - 1.24 mg/dL   Calcium 8.3 (L) 8.9 - 10.3 mg/dL   Phosphorus 3.0 2.5 - 4.6 mg/dL   Albumin 1.7 (L) 3.5 - 5.0 g/dL   GFR calc non Af Amer 8 (L) >60 mL/min   GFR calc Af Amer 9 (L) >60 mL/min    Comment: (NOTE) The eGFR has been calculated using the CKD EPI equation. This calculation has not been validated in all clinical situations. eGFR's persistently <60 mL/min signify possible Chronic Kidney Disease.    Anion gap 7 5 - 15  CBC     Status: Abnormal   Collection Time: 12/27/16  9:00 AM  Result Value Ref Range   WBC 13.1 (H) 4.0 - 10.5 K/uL   RBC 3.43 (L) 4.22 - 5.81 MIL/uL   Hemoglobin 10.6 (L) 13.0 - 17.0 g/dL   HCT 32.8 (L) 39.0 - 52.0 %   MCV 95.6 78.0 - 100.0 fL   MCH 30.9 26.0 - 34.0 pg   MCHC 32.3 30.0 - 36.0 g/dL   RDW 14.4 11.5 - 15.5 %   Platelets 357 150 - 400 K/uL  Glucose, capillary     Status: Abnormal   Collection Time: 12/27/16 10:56 AM  Result Value Ref Range   Glucose-Capillary 275 (H) 65 - 99 mg/dL     Imaging Results (Last 48 hours)  No results found.       Medical Problem List and Plan: 1.  Functional and mobility deficits  secondary to PAD requiring left TMA, right groin wound, multiple medical             -admit to inpatient rehab 2.  DVT Prophylaxis/Anticoagulation: Pharmaceutical: Heparin 3. Pain Management: continue oxycodone prn.  4. Mood: team to provide ego support. LCSW to follow for evaluation and support.  5. Neuropsych: This patient is capable of making decisions on his own behalf. 6. Skin/Wound Care: continue VAC left foot. Monitor right groin wound--             -continue dry dressing to groin 7. Fluids/Electrolytes/Nutrition: Monitor I/O. PD in evenings after therapy. 8. T2DM: used NPH at nights with   novolog 20 units tid at home. Monitor BS ac/hs. Currently on Lantus being titrated upwards for tighter control--did not get lantus this am. Continue SSI for elevated BS. Question resume home regimen as intake improves 9. Constipation: Increase colace to home regimen with sorbitol daily prn.   10 OSA: has been compliant with BIPAP at nights.  11. Leucocytosis:  Continue to monitor groin wound for healing.  12. Anemia of chronic disease:  H/H stable and at goal. Continue to monitor.  13. CADs/p CABG: On ASA/plavix and lipitor 14. History of CHF: Monitor weight daily--fluid manged with PD. OnASA, lipitor, coreg and lasix bid.  15. ESRD: On PD daily at nights. Tolerating it well at present     Post Admission Physician Evaluation: 1. Functional deficits secondary  to left TMA, right groin wound/multiple medical. 2. Patient is admitted to receive collaborative, interdisciplinary care between the physiatrist, rehab nursing staff, and therapy team. 3. Patient's level of medical complexity and substantial therapy needs in context of that medical necessity cannot be provided at a lesser intensity of care such as a SNF. 4. Patient has experienced substantial functional loss from his/her baseline which was documented above under the "Functional History" and "Functional Status" headings.  Judging by the patient's diagnosis, physical exam, and functional history, the patient  has potential for functional progress which will result in measurable gains while on inpatient rehab.  These gains will be of substantial and practical use upon discharge  in facilitating mobility and self-care at the household level. 5. Physiatrist will provide 24 hour management of medical needs as well as oversight of the therapy plan/treatment and provide guidance as appropriate regarding the interaction of the two. 6. The Preadmission Screening has been reviewed and patient status is unchanged unless otherwise stated above. 7. 24  hour rehab nursing will assist with bowel management, safety, skin/wound care, disease management, medication administration, pain management and patient education  and help integrate therapy concepts, techniques,education, etc. 8. PT will assess and treat for/with: Lower extremity strength, range of motion, stamina, balance, functional mobility, safety, adaptive techniques and equipment, NMR, pain control, vac-work arounds, family ed.   Goals are: mod I to supervision. 9. OT will assess and treat for/with: ADL's, functional mobility, safety, upper extremity strength, adaptive techniques and equipment, NMR, vac mgt, family ed.   Goals are: mod I to supervision. Therapy may not yet proceed with showering this patient. 10. SLP will assess and treat for/with: n/a.  Goals are: n/a. 11. Case Management and Social Worker will assess and treat for psychological issues and discharge planning. 12. Team conference will be held weekly to assess progress toward goals and to determine barriers to discharge. 13. Patient will receive at least 3 hours of therapy per day at least 5 days per week. 14. ELOS: 14-17 days       15. Prognosis:  excellent     Meredith Staggers, MD, [place] [facility] 12/27/2016  Bary Leriche, Hershal Coria 12/27/2016

## 2016-12-27 NOTE — Progress Notes (Signed)
Occupational Therapy Treatment Patient Details Name: [name] MRN: [id number] DOB: [birth year] Today's Date: 12/27/2016    History of present illness Pt is a 76 yo male s/p L third toe amputation 11/29/16 and subsequent dehiscence, revision surgery on 12/11/16. Pt underwent transmetatarsal amputation on 9/14 and was made NWB on lt. PMH significant for CHF, COPD, CAD, CABG 2011, defirillator placement, DM2, ESRD on peritoneal dialysis, HTN and stroke.     OT comments  Pt demonstrating progress toward OT goals. He was able to complete toilet transfer to and from [facility] with min assist when transferring to the R and mod assist to the L. He was able to complete chair push-up to assist with pericare but required max assist to thoroughly complete. Initiated education concerning use of AE for LB ADL and will continue to reinforce. OT will continue to follow while admitted.    Follow Up Recommendations  CIR;Supervision/Assistance - 24 hour    Equipment Recommendations  Other (comment) (TBD)    Recommendations for Other Services      Precautions / Restrictions Precautions Precautions: Fall Required Braces or Orthoses: Other Brace/Splint Other Brace/Splint: post op shoe on Lt foot Restrictions Weight Bearing Restrictions: Yes LLE Weight Bearing: Non weight bearing LLE Partial Weight Bearing Percentage or Pounds: Darco shoe       Mobility Bed Mobility Overal bed mobility: Needs Assistance Bed Mobility: Sit to Supine       Sit to supine: Min assist;+2 for safety/equipment   General bed mobility comments: Min assist +2 for linesto manage B LE.   Transfers Overall transfer level: Needs assistance   Transfers: Lateral/Scoot Transfers Sit to Stand: Min assist;+2 safety/equipment        Lateral/Scoot Transfers: Min assist;Mod assist General transfer comment: Min assist for transfer to [facility]; mod assist for transfer back to chair. Min assist for bed to chair. Lateral scoot for all  three.    Balance Overall balance assessment: Needs assistance Sitting-balance support: No upper extremity supported;Feet supported Sitting balance-Leahy Scale: Fair                                     ADL either performed or assessed with clinical judgement   ADL Overall ADL's : Needs assistance/impaired                         Toilet Transfer: Minimal assistance (lateral scoot with drop arm) Toilet Transfer Details (indicate cue type and reason): lateral scoot from drop arm to [facility] Toileting- Clothing Manipulation and Hygiene: Total assistance Toileting - Clothing Manipulation Details (indicate cue type and reason): Pt able to complete chair push-up to create space for pericare.        General ADL Comments: Session focused on transfers to and from [facility]. Initiated education concerning AE for LB ADL and pt needs reinforcement.      Vision   Vision Assessment?: No apparent visual deficits   Perception     Praxis      Cognition Arousal/Alertness: Awake/alert Behavior During Therapy: WFL for tasks assessed/performed Overall Cognitive Status: Within Functional Limits for tasks assessed                                          Exercises     Shoulder Instructions  General Comments Wife present and assisting during session.     Pertinent Vitals/ Pain       Pain Location: L groin Pain Descriptors / Indicators: Grimacing;Throbbing  Home Living                                          Prior Functioning/Environment              Frequency  Min 2X/week        Progress Toward Goals  OT Goals(current goals can now be found in the care plan section)  Progress towards OT goals: Progressing toward goals  Acute Rehab OT Goals Patient Stated Goal: to rehab then home OT Goal Formulation: With patient/family Time For Goal Achievement: 12/23/16 Potential to Achieve Goals: Good  Plan Discharge plan  remains appropriate    Co-evaluation                 AM-PAC PT "6 Clicks" Daily Activity     Outcome Measure   Help from another person eating meals?: None Help from another person taking care of personal grooming?: A Little Help from another person toileting, which includes using toliet, bedpan, or urinal?: A Lot Help from another person bathing (including washing, rinsing, drying)?: A Lot Help from another person to put on and taking off regular upper body clothing?: A Little Help from another person to put on and taking off regular lower body clothing?: A Lot 6 Click Score: 16    End of Session Equipment Utilized During Treatment: Other (comment);Gait belt (Darco shoe)  OT Visit Diagnosis: Unsteadiness on feet (R26.81);Muscle weakness (generalized) (M62.81) Pain - Right/Left: Left Pain - part of body: Leg   Activity Tolerance Patient tolerated treatment well   Patient Left in chair;with call bell/phone within reach;with family/visitor present   Nurse Communication Mobility status (pt's incision is bleeding)        Time: 1610-9604 OT Time Calculation (min): 39 min  Charges: OT General Charges $OT Visit: 1 Visit OT Treatments $Self Care/Home Management : 38-52 mins  Doristine Section, MS OTR/L  Pager: [phone]    [name] 12/27/2016, 1:20 PM

## 2016-12-27 NOTE — Progress Notes (Signed)
Report called to [place], RN on .

## 2016-12-27 NOTE — Progress Notes (Signed)
[name], RN Rehab Admission Coordinator Signed Physical Medicine and Rehabilitation  PMR Pre-admission Date of Service: 12/26/2016 2:11 PM  Related encounter: Admission (Current) from 12/15/2016 in [facility] 4E CV SURGICAL PROGRESSIVE CARE       '[]' Hide copied text PMR Admission Coordinator Pre-Admission Assessment  Patient: [name] is an 76 y.o., male MRN: [id number] DOB: 04/21/1940 Height: '5\' 9"'  (175.3 cm) Weight: 89.2 kg (196 lb 10.4 oz)                                                                                                                                            Insurance Information HMO: No    PPO:       PCP:       IPA:       80/20:       OTHER:   PRIMARY: Medicare A/b      Policy#: [id number]      Subscriber: Gerome Apley CM Name:        Phone#:       Fax#:   Pre-Cert#:        Employer:  Retired Benefits:  Phone #:       Name: Checked in [place]. Date: 02/09/06     Deduct: $1340      Out of Pocket Max: none      Life Max: N/A CIR: 100%      SNF: 100 days Outpatient: 80%     Co-Pay: 20% Home Health: 100%      Co-Pay: none DME: 80%     Co-Pay: 20% Providers: patient's choice  SECONDARY: BCBS      Policy#: [id number]      Subscriber:  JGerome ApleyCM Name:        Phone#:       Fax#:   Pre-Cert#:        Employer: Retired Benefits:  Phone #: [phone]    Name:   Eff. 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 Donzetta Matters. He failed attempts at limb salvage and hospital course complicated by N/V, leucocytosis and fever likely due to sepsis. GB ultrasound revealed GB polyp without acute cholecystitis. He has had issues with right groin wound dehiscence of wound with gangrenous changes left foot. 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 Sharol Given on 9/14. Post op NWB LLE and antibiotics discontinued.  Dr. Constance Holster consulted for input on sore throat, difficulty swallowing and oral pharyngeal ulcerations. He recommended supportive care for viral v/s antibiotics related ulcers. Right groin with small hematoma and decrease in erythema per reports. CIR recommended due to deficits in mobility and decreased ability to carry out ADL tasks.    Past Medical History      Past Medical History:  Diagnosis Date  . AICD (automatic cardioverter/defibrillator) present   . CHF (congestive heart failure) ([place])   . COPD (chronic obstructive pulmonary disease) ([place])   . Coronary artery disease    CABG 2011  . Diabetes mellitus without complication ([place])   . ESRD on peritoneal dialysis [facility])    Started PD Dec 2016 in [place]. Nephrology is in [place].   [name] History of peritonitis    PD cath related peritonitis in April 2017  . Hypertension   . Peripheral vascular disease ([place])   . 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  He had a rash on his chest apparently prompting this w/u.  Never had renal bx.  He was referred to a rheum MD in [place] and per the wife he was treated with MTX and other medications but had side effects to "all of it" and isn't taking anything for this as of Jun 2017.   . Stroke [facility])     Family History  family history includes Heart disease in his father.  Prior  Rehab/Hospitalizations: No previous rehab admissions.  Has the patient had major surgery during 100 days prior to admission? Yes.  Patient had an ICD/defibrillator placed on 11/23/16  Current Medications   Current Facility-Administered Medications:  .  0.9 %  sodium chloride infusion, , Intravenous, Continuous, Audry Pili, MD, Last Rate: 10 mL/hr at 12/23/16 3299 .  0.9 %  sodium chloride infusion, , Intravenous, Continuous, Newt Minion, MD, Last Rate: 10 mL/hr at 12/24/16 0142 .  acetaminophen (TYLENOL) tablet 325-650 mg, 325-650 mg, Oral, Q4H PRN, 650 mg at 12/17/16 2335 **OR** acetaminophen (TYLENOL) suppository 325-650 mg, 325-650 mg, Rectal, Q4H PRN, Rhyne, Samantha J, PA-C, 650 mg at 12/17/16 0047 .  acetaminophen (TYLENOL) tablet 650 mg, 650 mg, Oral, Q6H PRN **OR** acetaminophen (TYLENOL) suppository 650 mg, 650 mg, Rectal, Q6H PRN, Newt Minion, MD .  aspirin EC tablet 81 mg, 81 mg, Oral, Daily, Rhyne, Samantha J, PA-C, 81 mg at 12/27/16 0958 .  atorvastatin (LIPITOR) tablet 80 mg, 80 mg, Oral, q1800, Rhyne, Samantha J, PA-C, 80 mg at 12/26/16 1825 .  bisacodyl (DULCOLAX) suppository 10 mg, 10 mg, Rectal, Daily PRN, Rhyne, Samantha J, PA-C .  bisacodyl (DULCOLAX) suppository 10 mg, 10 mg, Rectal, Daily PRN, Newt Minion, MD .  calcium acetate (PHOSLO) capsule 667 mg, 667 mg, Oral, TID WC, Rhyne, Samantha J, PA-C, 667 mg at 12/27/16 1253 .  carvedilol (COREG) tablet 12.5 mg, 12.5 mg, Oral, BID WC, Rhyne, Samantha J, PA-C, 12.5 mg at 12/27/16 0842 .  clopidogrel (PLAVIX) tablet 75 mg, 75  mg, Oral, Daily, Rhyne, Samantha J, PA-C, 75 mg at 12/27/16 9371 .  dialysis solution 1.5% low-MG/low-CA dianeal solution, , Intraperitoneal, Q24H, Madelon Lips, MD, 5,000 mL at 12/24/16 1800 .  dialysis solution 1.5% low-MG/low-CA dianeal solution, , Intraperitoneal, Q24H, Madelon Lips, MD .  dialysis solution 2.5% low-MG/low-CA dianeal solution, , Intraperitoneal, Q24H, Madelon Lips, MD .  docusate sodium (COLACE) capsule 100 mg, 100 mg, Oral, BID, Newt Minion, MD, 100 mg at 12/27/16 0958 .  furosemide (LASIX) tablet 40 mg, 40 mg, Oral, BID, Rhyne, Samantha J, PA-C, 40 mg at 12/27/16 0842 .  gentamicin cream (GARAMYCIN) 0.1 % 1 application, 1 application, Topical, Daily, Waynetta Sandy, MD .  heparin 1000 unit/ml injection 2,500 Units, 2,500 Units, Intraperitoneal, PRN, Madelon Lips, MD .  heparin 2,500 Units in dialysis solution 2.5% low-MG/low-CA 5,000 mL dialysis solution, , Peritoneal Dialysis, PRN, Donato Heinz, MD .  heparin injection 5,000 Units, 5,000 Units, Subcutaneous, Q8H, Rhyne, Samantha J, PA-C, 5,000 Units at 12/27/16 0522 .  hydrALAZINE (APRESOLINE) injection 5 mg, 5 mg, Intravenous, Q20 Min PRN, Rhyne, Samantha J, PA-C .  HYDROmorphone (DILAUDID) injection 1 mg, 1 mg, Intravenous, Q2H PRN, Newt Minion, MD .  insulin aspart (novoLOG) injection 0-9 Units, 0-9 Units, Subcutaneous, TID WC, Rosita Fire, MD, 5 Units at 12/27/16 1253 .  insulin aspart (novoLOG) injection 8 Units, 8 Units, Subcutaneous, TID WC, Rosita Fire, MD, 8 Units at 12/27/16 1253 .  insulin glargine (LANTUS) injection 35 Units, 35 Units, Subcutaneous, BID, Carolin Sicks, Osie Bond, MD .  isosorbide mononitrate (IMDUR) 24 hr tablet 30 mg, 30 mg, Oral, Daily, Rhyne, Samantha J, PA-C, 30 mg at 12/27/16 0958 .  labetalol (NORMODYNE,TRANDATE) injection 10 mg, 10 mg, Intravenous, Q10 min PRN, Rhyne, Samantha J, PA-C .  lactulose (CHRONULAC) 10 GM/15ML solution 30 g, 30 g, Oral, BID, Rosita Fire, MD, 30 g at 12/26/16 2131 .  lidocaine (XYLOCAINE) 2 % jelly 1 application, 1 application, Topical, Once PRN, Izora Gala, MD .  lidocaine (XYLOCAINE) 4 % external solution 0-50 mL, 0-50 mL, Topical, Once PRN, Izora Gala, MD .  lidocaine-EPINEPHrine (XYLOCAINE-EPINEPHrine) 1 %-1:200000 (PF) injection 0-30 mL, 0-30 mL, Intradermal, Once PRN,  Izora Gala, MD .  losartan (COZAAR) tablet 25 mg, 25 mg, Oral, QHS, Rhyne, Samantha J, PA-C, 25 mg at 12/26/16 2132 .  magic mouthwash, 5 mL, Oral, QID PRN, Ulyses Amor, PA-C, 5 mL at 12/26/16 1129 .  magnesium citrate solution 1 Bottle, 1 Bottle, Oral, Once PRN, Newt Minion, MD .  MEDLINE mouth rinse, 15 mL, Mouth Rinse, BID, Waynetta Sandy, MD, 15 mL at 12/27/16 1000 .  methocarbamol (ROBAXIN) tablet 500 mg, 500 mg, Oral, Q6H PRN, 500 mg at 12/25/16 0851 **OR** methocarbamol (ROBAXIN) 500 mg in dextrose 5 % 50 mL IVPB, 500 mg, Intravenous, Q6H PRN, Newt Minion, MD .  metoCLOPramide (REGLAN) tablet 5-10 mg, 5-10 mg, Oral, Q8H PRN **OR** metoCLOPramide (REGLAN) injection 5-10 mg, 5-10 mg, Intravenous, Q8H PRN, Newt Minion, MD .  metoprolol tartrate (LOPRESSOR) injection 2-5 mg, 2-5 mg, Intravenous, Q2H PRN, Rhyne, Samantha J, PA-C .  morphine 2 MG/ML injection 2 mg, 2 mg, Intravenous, Q2H PRN, Rhyne, Samantha J, PA-C, 2 mg at 12/19/16 0835 .  nitroGLYCERIN (NITROSTAT) SL tablet 0.4 mg, 0.4 mg, Sublingual, Q5 min PRN, Rhyne, Samantha J, PA-C .  ondansetron (ZOFRAN) tablet 4 mg, 4 mg, Oral, Q6H PRN **OR** ondansetron (ZOFRAN) injection 4 mg, 4 mg, Intravenous, Q6H PRN,  Newt Minion, MD, 4 mg at 12/26/16 1225 .  oxyCODONE (Oxy IR/ROXICODONE) immediate release tablet 5-10 mg, 5-10 mg, Oral, Q3H PRN, Newt Minion, MD, 10 mg at 12/26/16 1433 .  oxyCODONE-acetaminophen (PERCOCET/ROXICET) 5-325 MG per tablet 1-2 tablet, 1-2 tablet, Oral, Q6H PRN, Rhyne, Samantha J, PA-C, 2 tablet at 12/27/16 1253 .  oxymetazoline (AFRIN) 0.05 % nasal spray 1 spray, 1 spray, Each Nare, Once PRN, Izora Gala, MD .  pantoprazole (PROTONIX) EC tablet 40 mg, 40 mg, Oral, Daily, Rhyne, Samantha J, PA-C, 40 mg at 12/27/16 0958 .  phenol (CHLORASEPTIC) mouth spray 1 spray, 1 spray, Mouth/Throat, PRN, Rhyne, Samantha J, PA-C, 1 spray at 12/22/16 1204 .  polyethylene glycol (MIRALAX / GLYCOLAX) packet 17  g, 17 g, Oral, Daily PRN, Newt Minion, MD .  polyethylene glycol (MIRALAX / GLYCOLAX) packet 17 g, 17 g, Oral, Daily, Madelon Lips, MD, 17 g at 12/25/16 1219 .  potassium chloride SA (K-DUR,KLOR-CON) CR tablet 20 mEq, 20 mEq, Oral, BID, Madelon Lips, MD, 20 mEq at 12/27/16 0958 .  silver nitrate applicators applicator 1 Stick, 1 Stick, Topical, Once PRN, Izora Gala, MD .  triamcinolone (KENALOG) 0.1 % paste, , Mouth/Throat, TID, Donzetta Matters, Georgia Dom, MD .  TRIPLE ANTIBIOTIC [phone] OINT 1 application, 1 application, Apply externally, Once PRN, Izora Gala, MD .  Derrill Memo ON 01/08/2017] Vitamin D (Ergocalciferol) (DRISDOL) capsule 50,000 Units, 50,000 Units, Oral, Q30 days, Rhyne, Samantha J, PA-C  Patients Current Diet: Diet Carb Modified Fluid consistency: Thin; Room service appropriate? Yes  Precautions / Restrictions Precautions Precautions: Fall Other Brace/Splint: post op shoe on Lt foot Restrictions Weight Bearing Restrictions: Yes LLE Weight Bearing: Non weight bearing LLE Partial Weight Bearing Percentage or Pounds: Darco shoe   Has the patient had 2 or more falls or a fall with injury in the past year?No  Prior Activity Level Limited Community (1-2x/wk): Went out 2-3 X a week.  Not driving since 09/29/28 when ICD was placed.  wife drives.  Home Assistive Devices / Equipment Home Assistive Devices/Equipment: BIPAP, CBG Meter, Wheelchair Home Equipment: Environmental consultant - 4 wheels, Cane - quad, Wheelchair - manual  Prior Device Use: Indicate devices/aids used by the patient prior to current illness, exacerbation or injury? Manual wheelchair.  Uses wheelchair for shopping such as at [facility] for longer distances.  Prior Functional Level Prior Function Level of Independence: Needs assistance Gait / Transfers Assistance Needed: uses [name], RW ADL's / Homemaking Assistance Needed: A for socks, can bath self in shower  Self Care: Did the patient need help bathing,  dressing, using the toilet or eating?  Independent  Indoor Mobility: Did the patient need assistance with walking from room to room (with or without device)? Independent  Stairs: Did the patient need assistance with internal or external stairs (with or without device)? Independent  Functional Cognition: Did the patient need help planning regular tasks such as shopping or remembering to take medications? Independent  Current Functional Level Cognition  Overall Cognitive Status: Within Functional Limits for tasks assessed Orientation Level: Oriented X4    Extremity Assessment (includes Sensation/Coordination)  Upper Extremity Assessment: Overall WFL for tasks assessed  Lower Extremity Assessment: LLE deficits/detail LLE Deficits / Details: L foot and ankle ROM limited by surgical intervention, knee and hip strength grossly 4/5 LLE: Unable to fully assess due to immobilization    ADLs  Overall ADL's : Needs assistance/impaired Eating/Feeding: Sitting, Independent Eating/Feeding Details (indicate cue type and reason): Pt can independently feed himself but was  having his wife feed him on OT arrival.  Grooming: Set up, Sitting Grooming Details (indicate cue type and reason): Becomes winded when completing seated ADL tasks.  Upper Body Bathing: Supervision/ safety, Sitting Upper Body Bathing Details (indicate cue type and reason): Fatigues easily Lower Body Bathing: Maximal assistance Lower Body Bathing Details (indicate cue type and reason): min A +2 sit<>stand Upper Body Dressing : Set up, Sitting Lower Body Dressing: Maximal assistance, Sit to/from stand Lower Body Dressing Details (indicate cue type and reason): Able to assist therapist to doff Darco shoe Toilet Transfer: Minimal assistance (lateral scoot with drop arm) Toilet Transfer Details (indicate cue type and reason): lateral scoot from drop arm to [facility] Toileting- Clothing Manipulation and Hygiene: Total  assistance Toileting - Clothing Manipulation Details (indicate cue type and reason): Pt able to complete chair push-up to create space for pericare.  General ADL Comments: Session focused on transfers to and from [facility]. Initiated education concerning AE for LB ADL and pt needs reinforcement.     Mobility  Overal bed mobility: Needs Assistance Bed Mobility: Sit to Supine Supine to sit: Min assist, HOB elevated Sit to supine: Min assist, +2 for safety/equipment General bed mobility comments: Min assist +2 for linesto manage B LE.     Transfers  Overall transfer level: Needs assistance Equipment used: Rolling walker (2 wheeled), Ambulation equipment used Transfers: Lateral/Scoot Transfers Sit to Stand: Min assist, +2 safety/equipment Stand pivot transfers: Min assist, From elevated surface  Lateral/Scoot Transfers: Min assist, Mod assist General transfer comment: Min assist for transfer to [facility]; mod assist for transfer back to chair. Min assist for bed to chair. Lateral scoot for all three.    Ambulation / Gait / Stairs / Wheelchair Mobility  Ambulation/Gait Ambulation/Gait assistance: +2 safety/equipment, Min assist Ambulation Distance (Feet): 40 Feet Assistive device: Rolling walker (2 wheeled) Gait Pattern/deviations: Step-to pattern, Decreased stance time - left, Decreased step length - right, Decreased weight shift to left, Trunk flexed General Gait Details: cues for posture and proximity of RW to aid in limiting weight bearing on L LE; pt with less difficulty maintaining weight mostly on L heel with tennis shoe donned on R Gait velocity: decreased    Posture / Balance Dynamic Sitting Balance Sitting balance - Comments: with attempts to scoot forward to EOB pt with posterior lean Balance Overall balance assessment: Needs assistance Sitting-balance support: No upper extremity supported, Feet supported Sitting balance-Leahy Scale: Fair Sitting balance - Comments: with attempts  to scoot forward to EOB pt with posterior lean Standing balance support: Bilateral upper extremity supported Standing balance-Leahy Scale: Poor Standing balance comment: Mod assist to maintain standing in St[place] x 30 sec with verbal cues and assist to maintain NWB    Special needs/care consideration BiPAP/CPAP Yes, BIPAP CPM No Continuous Drip IV No Dialysis Yes, PD daily at night       Life Vest No, has an implanted ICD Oxygen No Special Bed No Trach Size No Wound Vac (area) Yes      Location:  Left foot transmetatarsal amputation site Skin:  New L trans met amputation site with VAC in place                              Bowel mgmt: Last BM 12/27/16  Bladder mgmt: Voiding in urinal Diabetic mgmt Yes, on insulin at home    Previous Home Environment Living Arrangements: Spouse/significant other Available Help at Discharge: Family, Available 24 hours/day  Type of Home: Mobile home Home Layout: One level Home Access: Stairs to enter Entrance Stairs-Rails: Left Entrance Stairs-Number of Steps: 1 Bathroom Shower/Tub: Gaffer, Charity fundraiser: Standard Home Care Services: No  Discharge Living Setting Plans for Discharge Living Setting: Lives with (comment), Mobile Home (Lives with wife in a mobile home.) Type of Home at Discharge: Mobile home (single wide.) Discharge Home Layout: One level Discharge Home Access: Stairs to enter Entrance Stairs-Number of Steps: 1 step and then small threshold step into mobile home. Does the patient have any problems obtaining your medications?: No  Social/Family/Support Systems Patient Roles: Spouse, Parent (Has a wife, a son and a daughter.) Contact Information: Elizardo Chilson - wife  Anticipated Caregiver: wife Anticipated Caregiver's Contact Information: Mariann Laster - wife - [phone] Ability/Limitations of Caregiver: Wife does not work and can assist at home Caregiver Availability: 24/7 Discharge Plan Discussed with Primary  Caregiver: Yes Is Caregiver In Agreement with Plan?: Yes Does Caregiver/Family have Issues with Lodging/Transportation while Pt is in Rehab?: No  Goals/Additional Needs Patient/Family Goal for Rehab: PT/OT mod I and supervision goals Expected length of stay: 14-17 days Cultural Considerations: None Dietary Needs: Carb mod, med cal, thin liquids Equipment Needs: TBD Special Service Needs: On peritoneal dialysis daily. Pt/Family Agrees to Admission and willing to participate: Yes Program Orientation Provided & Reviewed with Pt/Caregiver Including Roles  & Responsibilities: Yes  Decrease burden of Care through IP rehab admission: N/A  Possible need for SNF placement upon discharge: Not anticipated  Patient Condition: This patient's condition remains as documented in the consult dated 12/26/16, in which the Rehabilitation Physician determined and documented that the patient's condition is appropriate for intensive rehabilitative care in an inpatient rehabilitation facility. Will admit to inpatient rehab today.  Preadmission Screen Completed By:  [name], 12/27/2016 3:08 PM ______________________________________________________________________   Discussed status with Dr. Naaman Plummer on 12/27/16 at 1508 and received telephone approval for admission today.  Admission Coordinator:  [name], time 1508/Date 12/27/16       Cosigned by: Meredith Staggers, MD at 12/27/2016 3:27 PM  Revision History

## 2016-12-27 NOTE — Discharge Summary (Signed)
[facility] Discharge Summary   Patient ID:  [name] MRN: [id number] DOB/AGE: 1940-09-26 76 y.o.  Admit date: 12/15/2016 Discharge date: 12/27/2016 Date of Surgery: 12/15/2016 - 12/23/2016 Surgeon: [name]): Joshua Mustard, MD  Admission Diagnosis: gangrene toes Gangrene Left Foot  Discharge Diagnoses:  gangrene toes Gangrene Left Foot  Secondary Diagnoses: Past Medical History:  Diagnosis Date  . AICD (automatic cardioverter/defibrillator) present   . CHF (congestive heart failure) (HCC)   . COPD (chronic obstructive pulmonary disease) (HCC)   . Coronary artery disease    CABG 2011  . Diabetes mellitus without complication (HCC)   . ESRD on peritoneal dialysis [facility])    Started PD Dec 2016 in [place]. Nephrology is in [place].   [name] History of peritonitis    PD cath related peritonitis in April 2017  . Hypertension   . Peripheral vascular disease (HCC)   . Sjogren's syndrome (HCC)    Per pt's wife, was diagnosed in 2016 during w/u for cause of renal failure prior to starting dialysis.  He had a rash on his chest apparently prompting this w/u.  Never had renal bx.  He was referred to a rheum MD in [place] and per the wife he was treated with MTX and other medications but had side effects to "all of it" and isn't taking anything for this as of Jun 2017.   . Stroke (HCC)     Procedure(s): Left Transmetatarsal Amputation  Discharged Condition: good  HPI: [name] is a 76 y.o. male, who Was sent to the emergency department by his doctor in [place] for an acute change in the appearance of his left third toe.  He has had an ulcer on the third toe for approximately 4 weeks.  Overnight it became black.  He had ABIs in [place] never an 0.4 range.  The patient is on peritoneal dialysis at home.  He recently had a defibrillator placed at [facility].  He was started on antibiotics in the emergency department and admitted for further  evaluation.  He suffers and coronary artery disease and is status post CABG in 2011.  He takes Plavix and aspirin.  He is on a statin for hypercholesterolemia is medically managed for hypertension. Third toe amputation 11/29/2016 by [name].  F/U in our office 12/14/2016.  S/P angiogram with AT angioplasty.  He does have a buldge in the right groin at the access site.  It is unclear if this is a pseudoaneurysm or hematoma.    Returned to the OR 12/15/2016: Procedure Performed: 1.  Open repair of right common femoral pseudoaneurysm 2.  US guided cannulation of left anterior tibial artery 3.  CSI orbital atherectomy of left AT with 1.5 diamondback 4.  Balloon angioplasty of left AT with 3mm balloon, Left DP with 2.98mm balloon  5.  Resection of left 3rd metatarsal head 6.  Right lower extremity angiogram  He developed a fever with elevated WBC.  Rule out source PD cath verses Left foot.  Blood cultures taken and PD cath fluid cultures.  Started on IV antibiotics.    US revealed 6mm GB polyp but no evidence of acute cholecystitis.  Dr. Lajoyce Corners consult 12/21/2016: PROCEDURE:  Left Transmetatarsal Amputation 12/23/2016.  Wound vac placed non weight bearing left LE. .  Right groin with minimal bloody drainage.  No recurrent hematoma.  D/C IV antibiotics.  WBC stable and afebrile.    Consults:  Treatment Team:  Terrial Rhodes, MD Nada Libman,  MD Hollice Espy, MD  Aldean Baker MD  Significant Diagnostic Studies: CBC Lab Results  Component Value Date   WBC 10.5 12/23/2016   HGB 10.3 (L) 12/23/2016   HCT 31.6 (L) 12/23/2016   MCV 94.9 12/23/2016   PLT 308 12/23/2016    BMET    Component Value Date/Time   NA 132 (L) 12/23/2016 0652   K 3.8 12/23/2016 0652   CL 96 (L) 12/23/2016 0652   CO2 28 12/23/2016 0652   GLUCOSE 232 (H) 12/23/2016 0652   BUN 30 (H) 12/23/2016 0652   CREATININE 6.99 (H) 12/23/2016 0652   CALCIUM 8.1 (L) 12/23/2016 0652   GFRNONAA 7 (L) 12/23/2016 0652    GFRAA 8 (L) 12/23/2016 0652   COAG Lab Results  Component Value Date   INR 1.22 09/21/2015     Disposition:  Discharge to :Rehab Discharge Instructions    Negative Pressure Wound Therapy - Incisional    Complete by:  As directed    Continue Prevena wound VAC for 1 week. When the wound VAC alarms and stops working remove the dressing and applied dry dressing.   Non weight bearing    Complete by:  As directed    Laterality:  left   Extremity:  Lower     No current facility-administered medications on file prior to encounter.    Current Outpatient Prescriptions on File Prior to Encounter  Medication Sig Dispense Refill  . amoxicillin (AMOXIL) 500 MG capsule Take 2,000 mg by mouth See admin instructions. 1 hour prior to dental procedures    . aspirin EC 81 MG tablet Take 81 mg by mouth daily.    [name] atorvastatin (LIPITOR) 80 MG tablet Take 80 mg by mouth daily at 6 PM.    . calcium acetate (PHOSLO) 667 MG capsule Take 667 mg by mouth 3 (three) times daily with meals.    . carvedilol (COREG) 12.5 MG tablet Take 12.5 mg by mouth 2 (two) times daily with a meal.    . clopidogrel (PLAVIX) 75 MG tablet Take 75 mg by mouth daily.    [name] docusate sodium (COLACE) 100 MG capsule Take 100 mg by mouth 2 (two) times daily.     . furosemide (LASIX) 40 MG tablet Take 40 mg by mouth 2 (two) times daily.    . insulin NPH Human (HUMULIN N,NOVOLIN N) 100 UNIT/ML injection Inject 70 Units into the skin at bedtime.    . insulin regular (NOVOLIN R,HUMULIN R) 100 units/mL injection Inject 20 Units into the skin 3 (three) times daily before meals.    . isosorbide mononitrate (IMDUR) 30 MG 24 hr tablet Take 30 mg by mouth daily.    [name] losartan (COZAAR) 25 MG tablet Take 25 mg by mouth at bedtime.     . nitroGLYCERIN (NITROSTAT) 0.4 MG SL tablet Place 0.4 mg under the tongue every 5 (five) minutes as needed for chest pain.    [name] ondansetron (ZOFRAN) 4 MG tablet Take 4 mg by mouth every 6 (six) hours as needed for  nausea or vomiting.    [name] oxyCODONE-acetaminophen (PERCOCET/ROXICET) 5-325 MG tablet Take 1 tablet by mouth every 6 (six) hours as needed for severe pain. 20 tablet 0  . Vitamin D, Ergocalciferol, (DRISDOL) 50000 units CAPS capsule Take 50,000 Units by mouth every 30 (thirty) days.        Verbal and written Discharge instructions given to the patient. Wound care per Discharge AVS Follow-up Information    Joshua Mustard, MD  Follow up in 1 week(s).   Specialty:  Orthopedic Surgery Contact information: [address] [place] [address] [phone]           Signed: Clinton Gallant [facility] 12/27/2016, 7:32 AM

## 2016-12-27 NOTE — H&P (Deleted)
  The note originally documented on this encounter has been moved the the encounter in which it belongs.  

## 2016-12-27 NOTE — Progress Notes (Signed)
Rehab admissions - A bed has become available today on inpatient rehab.  Will admit to rehab today.  Call me for questions.  #[phone]

## 2016-12-27 NOTE — Progress Notes (Addendum)
[facility] [place]  Subjective  -  No new complaints.   Objective (!) 101/55 71 98.4 F (36.9 C) (Oral) 16 97%  Intake/Output Summary (Last 24 hours) at 12/27/16 0725 Last data filed at 12/27/16 1610  Gross per 24 hour  Intake            13095 ml  Output            15999 ml  Net            -2904 ml   Palpable DP /AT left Wound vac in place with good seal Heart RRR Lungs non labored breathing   Assessment/Planning: PAD- s/p open repair of R common femoral artery pseudoaneurysm by Dr. Randie Heinz 12/23/2016 PROCEDURE:  Left Transmetatarsal Amputation by Dr. Lajoyce Corners ESRD- Will continue with CCPD Afebrile, wound vac with good seal Pending CIR transfer for rehab    Clinton Gallant [facility] 12/27/2016 7:25 AM --  Laboratory Lab Results: No results for input(s): WBC, HGB, HCT, PLT in the last 72 hours. BMET No results for input(s): NA, K, CL, CO2, GLUCOSE, BUN, CREATININE, CALCIUM in the last 72 hours.  COAG Lab Results  Component Value Date   INR 1.22 09/21/2015   No results found for: PTT  Right groin probed.  Serous fluid returned.  No cavity identified.  Dry gauze placed over the wound.  Palpable left pedal pulse.  Vac in place.  Awaiting transfer to CIR  WElls BRabham

## 2016-12-27 NOTE — IPOC Note (Signed)
Overall Plan of Care [facility]) Patient Details Name: [name] MRN: [id number] DOB: [birth year]  Admitting Diagnosis: Debility  Hospital Problems: Principal Problem:   Debility Active Problems:   ESRD on peritoneal dialysis 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)   Diabetic peripheral neuropathy (HCC)   Anemia of chronic disease     Functional Problem List: Nursing Bowel, Edema, Endurance, Medication Management, Motor, Pain, Sensory, Skin Integrity  PT Balance, Endurance, Motor  OT Balance, Safety, Skin Integrity, Endurance, Motor  SLP    TR         Basic ADL's: OT Grooming, Bathing, Dressing, Toileting     Advanced  ADL's: OT       Transfers: PT Bed Mobility, Bed to Chair, Car, Furniture, Civil Service fast streamer, Research scientist (life sciences): PT Ambulation, Psychologist, prison and probation services, Stairs     Additional Impairments: OT None  SLP        TR      Anticipated Outcomes Item Anticipated Outcome  Self Feeding n/a  Swallowing      Basic self-care  supervision/ min A   Toileting  min A   Bathroom Transfers supervision  Bowel/Bladder  Mod I   Transfers  Mod I transfers  Locomotion  supervison short distances, w/c for community access  Communication     Cognition     Pain  < 4  Safety/Judgment  Mod I    Therapy Plan: PT Intensity: Minimum of 1-2 x/day ,45 to 90 minutes PT Frequency: 5 out of 7 days PT Duration Estimated Length of Stay: 12-14 days OT Intensity: Minimum of 1-2 x/day, 45 to 90 minutes OT Frequency: 5 out of 7 days OT Duration/Estimated Length of Stay: ~12-14 days      Team Interventions: Nursing Interventions Patient/Family Education, Bowel Management, Medication Management, Pain Management, Disease Management/Prevention, Skin Care/Wound Management  PT interventions Ambulation/gait training, Community reintegration,  Discharge planning, Disease management/prevention, DME/adaptive equipment instruction, Functional mobility training, Neuromuscular re-education, Pain management, Patient/family education, Psychosocial support, Skin care/wound management, Stair training, Therapeutic Activities, Therapeutic Exercise, UE/LE Strength taining/ROM, Wheelchair propulsion/positioning  OT Interventions    SLP Interventions    TR Interventions    SW/CM Interventions Discharge Planning, Psychosocial Support, Patient/Family Education   Barriers to Discharge MD  Medical stability, Wound care and Weight  Nursing Medication compliance, Wound Care, Nutrition means Diabetes control  PT      OT      SLP      SW       Team Discharge Planning: Destination: PT-Home ,OT- Home , SLP-  Projected Follow-up: PT-Home health PT, OT-  Home health OT, SLP-  Projected Equipment Needs: PT-Rolling walker with 5" wheels, OT- To be determined, SLP-  Equipment Details: PT-w/c needs TBD, OT-  Patient/family involved in discharge planning: PT- Patient, Family member/caregiver,  OT-Patient, Family member/caregiver, SLP-   MD ELOS: 12-14 days. Medical Rehab Prognosis:  Good Assessment: 76 y.o.malewith history of COPD, T2DM, CAD s/p ICD, ESRD- on PD, PAD with critical limb ischemia with non-healing LLE wound who was admitted for right femoral pseudoaneurysm repair and resection of Left 3rd MT head on 12/15/16 by Dr. Randie Heinz. He failed attempts at limb salvage and hospital course complicated by N/V, leucocytosis and fever likely due to sepsis. GB ultrasound revealed GB polyp without acute cholecystitis. He has had issues with right groin wound dehiscence of wound  with gangrenous changes left foot. 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 Lajoyce Corners on 9/14. Post op NWB LLE and antibiotics discontinued. Dr. Pollyann Kennedy consulted for input on sore throat, difficulty swallowing and oral pharyngeal ulcerations. He recommended  supportive care for viral v/s antibiotics related ulcers. Right groin with small hematoma and decrease in erythema. Patient with resulting functional deficits with mobility.  Will set goals for Mod I for most tasks with PT, supervision/Min A for most tasks with OT.  See Team Conference Notes for weekly updates to the plan of care

## 2016-12-27 NOTE — Progress Notes (Signed)
  [facility] Progress Note   Assessment/ Plan:    Dialysis Orders: Center: [place] home therapies  on CCPD . 6 exchanges/day one mid-day exchange (occurs aroudn 9am).  2.5 liters of 2.5% per CCPD and 2 liters of 2.5% for last fill and mid day drain.  1. 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  S/p L TMT 12/23/16 with Dr. Lajoyce Corners. Likely to CIR 2. ESRD- Will continue with CCPD, using 2.5% dextrose; weights lable.  Eliminated mid-day exchange and added extra exchange for ease while inpatient; at DC can resume prev PD Rx. 3. Hypertension/volume- BP stable 4. Anemia (ABLA)- Hgb  At goal, monitor will resume ESA if needed 5. Nausea, GB US with 6 mm gallbladder polyp. 6. Fever/ possible sepsis- Not conclusive pt ever with peritonitis, off, ABX, no abd pain / cloudy effluent; follow for now 7. Constipation: miralax daily 8. Hypokalemia: supplementing  Subjective:    Did better with 1 less exchange overnight Slept better Possibly to CIR soon    Objective:   BP (!) 101/55 (BP Location: Left Arm)   Pulse 71   Temp 98.4 F (36.9 C) (Oral)   Resp 16   Ht  (1.753 m)   Wt 97 kg (213 lb 13.5 oz)   SpO2 97%   BMI 31.58 kg/m   Physical Exam: GEN lying in bed,  NAD and sleeping HEENT EOMI, PERRL.  Healing OP ulcers NECK no JVD PULM clear bilaterally CV RRR no m/r/g ABD distended, NABS, PD cath c/d/i EXT L LE s/p TMT with woundvac in place, trace LLE edema, no RLE edema.  R groin with some firmness (no frank hematoma)  Labs: BMET  Recent Labs Lab 12/21/16 0905 12/23/16 0652  NA 130* 132*  K 3.0* 3.8  CL 93* 96*  CO2 26 28  GLUCOSE 465* 232*  BUN 26* 30*  CREATININE 6.46* 6.99*  CALCIUM 8.2* 8.1*   CBC  Recent Labs Lab 12/23/16 0652  WBC 10.5  HGB 10.3*  HCT 31.6*  MCV 94.9  PLT 308    @ Medications:    . aspirin EC  81 mg Oral Daily  . atorvastatin  80 mg Oral q1800  . calcium acetate   667 mg Oral TID WC  . carvedilol  12.5 mg Oral BID WC  . clopidogrel  75 mg Oral Daily  . docusate sodium  100 mg Oral BID  . furosemide  40 mg Oral BID  . gentamicin cream  1 application Topical Daily  . heparin  5,000 Units Subcutaneous Q8H  . insulin aspart  0-9 Units Subcutaneous TID WC  . insulin aspart  8 Units Subcutaneous TID WC  . insulin glargine  25 Units Subcutaneous BID  . isosorbide mononitrate  30 mg Oral Daily  . lactulose  30 g Oral BID  . losartan  25 mg Oral QHS  . mouth rinse  15 mL Mouth Rinse BID  . pantoprazole  40 mg Oral Daily  . polyethylene glycol  17 g Oral Daily  . potassium chloride  20 mEq Oral BID  . triamcinolone   Mouth/Throat TID  . [START ON 01/08/2017] Vitamin D (Ergocalciferol)  50,000 Units Oral Q30 days     [facility] 12/27/2016, 8:55 AM

## 2016-12-27 NOTE — Progress Notes (Signed)
Patient ID: [name], male   DOB: May 21, 1940, 76 y.o.   MRN: [id number] Patient admitted to 4M12 via bed, escorted by nursing staff and spouse.  Patient and spouse verbalized understanding of rehab process.  Patients wife had been helping patient transfer on other unit and understands that she cannot do that here until properly trained by therapy staff.  Patient has diabetic foot ulcer in between right 4th and 5th toes.  Marissa Nestle, PA notified, ordered to cleanse, apply microguard powder to keep dry and apply gauze.  Wound vac intact, no drainage noted.  Changed dressing to right groin, draining small amount of serosanguinous fluid.  Patient reports that it has drained more today, perhaps because he has moved around more.  Will continue to monitor.  Dani Gobble, RN

## 2016-12-27 NOTE — Progress Notes (Signed)
Patient wearing home BIPAP unit when RT entered room. Patient resting comfortably at this time. RT will monitor as needed.

## 2016-12-27 NOTE — Progress Notes (Signed)
Inpatient Diabetes Program Recommendations  AACE/ADA: New Consensus Statement on Inpatient Glycemic Control (2015)  Target Ranges:  Prepandial:   less than 140 mg/dL      Peak postprandial:   less than 180 mg/dL (1-2 hours)      Critically ill patients:  140 - 180 mg/dL   Results for [name], [name] (MRN [id number]) as of 12/27/2016 09:52  Ref. Range 12/26/2016 06:05 12/26/2016 11:35 12/26/2016 16:29 12/26/2016 21:04 12/27/2016 06:01  Glucose-Capillary Latest Ref Range: 65 - 99 mg/dL 409 (H) 811 (H) 914 (H) 217 (H) 332 (H)   Review of Glycemic Control  Inpatient Diabetes Program Recommendations:    GLucose in the 200-400 range. Patient takes NPH 70 units at home and Novolog 20 units tid with meals. Consider increasing Lantus to 35 units BID.  Thanks,  Christena Deem RN, MSN, [facility] Inpatient Diabetes Coordinator Team Pager [phone] (8a-5p)

## 2016-12-27 NOTE — Care Management Note (Signed)
Case Management Note Donn Pierini RN, BSN Unit 4E-Case Manager [phone]  Patient Details  Name: [name] MRN: [id number] Date of Birth: 12-Dec-1940  Subjective/Objective:   Pt admitted with PAD/gangrene of toe, s/p Open repair of right common femoral pseudo-aneurysm and toe amputation- pt may still need further amputation- currently with wound VAC- possible return to OR later this week.                 Action/Plan: PTA pt lived at home with spouse- hx of Peritoneal dialysis- CM to follow for d/c needs  Expected Discharge Date:  12/27/16               Expected Discharge Plan:  IP Rehab Facility  In-House Referral:  Clinical Social Work  Discharge planning Services  CM Consult  Post Acute Care Choice:  NA Choice offered to:  NA  DME Arranged:    DME Agency:     HH Arranged:    HH Agency:     Status of Service:  Completed, signed off  If discussed at [employer] of Stay Meetings, dates discussed:  9/11, 9/13, 918  Discharge Disposition: IP rehab- CIR   Additional Comments:  12/27/16- 1500- Donn Pierini RN, CM- received call from Genie with CIR that they have bed available for pt today- notified E.M. Colline- with attending team for d/c order- plan to d/c pt to CIR later this afternoon.   12/26/16- 1530- Donn Pierini RN, CM- spoke with Genie with CIR - they are following pt for readiness to go to IP rehab- potentially may have bed available tomorrow- CM will f/u with CIR tomorrow regarding possible admission.   12/23/16- 1200- Kellianne Ek RN, CM- pt s/p Left Transmetatarsal Amputation,, Application Prevena wound VAC today-  CM to continue to follow for d/c needs- PT/OT to re-eval post op for appropriate d/c plan- CIR vs SNF- CSW has been consulted for possible SNF placement  Zenda Alpers Lenn Sink, RN 12/27/2016, 4:49 PM

## 2016-12-27 NOTE — Progress Notes (Signed)
Medical Consult PROGRESS NOTE    [name]  ZOX:[id number] DOB: 04-Mar-1941 DOA: 12/15/2016 PCP: System, Pcp Not In   Brief Narrative: 76 year old male with history of COPD, CHF, coronary artery disease, CABG, type 2 diabetes, ESRD on peritoneal dialysis, hypertension, a stroke, left foot gangrene, peripheral artery disease status post left transmetatarsal amputation and currently has a wound VAC, consulted for the management of altered mental status and diabetes.  Assessment & Plan:   # Uncontrolled type 2 diabetes mellitus with hyperglycemia, with long-term current use of insulin (HCC): -patient's labile blood sugar table likely due to reduced GFR, being an PD and surgical stress. -Blood sugar level elevated and has A1c of 8.8.patient takes higher dose of insulin/NPH at home. I increased the dose of Lantus to 35 units twice a day. Continue NovoLog 8 units 3 times a day with meals and continue sliding scale.Monitor blood sugar level. Patient with good oral intake.  #Acute metabolic encephalopathy: Patient's mental status improved and around baseline. Continue to monitor.  #Hypertension: Blood pressure acceptable. Continue current cardiac medication. Monitor blood pressure.  #ESRD on peritoneal dialysis: Patient was on antibiotics for presumed peritonitis. Off antibiotics  as per nephrologist. Cultures negative.  # Peripheral artery disease, gangrene of left foot is status post amputation and has a wound VAC: Further management as per surgery team.  # History of coronary artery disease status post CABG: Patient has no chest pain. Continue any aspirin, Lipitor, Coreg, Plavix, Lasix, losartan  #constipation:discontinue lactulose.continue MiraLAX, Dulcolax. Abdomen exam benign.  Recommended to follow-up with PCP if patient is being discharged. Monitor blood sugar level and adjust dose of insulin.  DVT prophylaxis: Heparin subcutaneous Code Status: Full code Family Communication:  Discussed with the patient's wife at bedside  Subjective: Seen and examined at bedside. Denied headache, dizziness, nausea vomiting chest pain shortness of breath. Objective: Vitals:   12/26/16 1922 12/26/16 1955 12/27/16 0000 12/27/16 0400  BP: (!) 140/59 (!) 146/61 (!) 92/57 (!) 101/55  Pulse: 85 74 74 71  Resp: 20 (!) Temp: 98.3 F (36.8 C) 98 F (36.7 C) 98.6 F (37 C) 98.4 F (36.9 C)  TempSrc: Oral Oral Axillary Oral  SpO2: 96% (!) 82% 93% 97%  Weight: 89.4 kg (197 lb) 97 kg (213 lb 13.5 oz)    Height:        Intake/Output Summary (Last 24 hours) at 12/27/16 1035 Last data filed at 12/27/16 0644  Gross per 24 hour  Intake            12975 ml  Output            15999 ml  Net            -3024 ml   Filed Weights   12/26/16 1230 12/26/16 1922 12/26/16 1955  Weight: 89.5 kg (197 lb 5 oz) 89.4 kg (197 lb) 97 kg (213 lb 13.5 oz)    Examination:  General exam: lying on bed comfortable, not in distress  Respiratory system: clear bilateral, respiratory effort normal, no wheezing or crackle Cardiovascular system: regular rate and rhythm, S1-S2 normal.  Gastrointestinal system: abdomen soft, nontender, nondistended. Bowel is positive. Central nervous system: Alert and oriented. No focal neurological deficits. Extremities: Left foot with dressing and wound VAC Psychiatry: Judgement and insight appear normal. Mood & affect appropriate.     Data Reviewed: I have personally reviewed following labs and imaging studies  CBC:  Recent Labs Lab 12/23/16 0652 12/27/16 0900  WBC 10.5  13.1*  HGB 10.3* 10.6*  HCT 31.6* 32.8*  MCV 94.9 95.6  PLT 308 357   Basic Metabolic Panel:  Recent Labs Lab 12/21/16 0905 12/23/16 0652 12/27/16 0900  NA 130* 132* 131*  K 3.0* 3.8 3.4*  CL 93* 96* 96*  CO2 GLUCOSE 465* 232* 350*  BUN 26* 30* 26*  CREATININE 6.46* 6.99* 6.31*  CALCIUM 8.2* 8.1* 8.3*  PHOS  --   --  3.0   GFR: Estimated Creatinine  Clearance: 11.6 mL/min (A) (by C-G formula based on SCr of 6.31 mg/dL (H)). Liver Function Tests:  Recent Labs Lab 12/27/16 0900  ALBUMIN 1.7*   No results for input(s): LIPASE, AMYLASE in the last 168 hours. No results for input(s): AMMONIA in the last 168 hours. Coagulation Profile: No results for input(s): INR, PROTIME in the last 168 hours. Cardiac Enzymes: No results for input(s): CKTOTAL, CKMB, CKMBINDEX, TROPONINI in the last 168 hours. BNP (last 3 results) No results for input(s): PROBNP in the last 8760 hours. HbA1C:  Recent Labs  12/24/16 1159  HGBA1C 8.8*   CBG:  Recent Labs Lab 12/26/16 0605 12/26/16 1135 12/26/16 1629 12/26/16 2104 12/27/16 0601  GLUCAP 356* 440* 202* 217* 332*   Lipid Profile: No results for input(s): CHOL, HDL, LDLCALC, TRIG, CHOLHDL, LDLDIRECT in the last 72 hours. Thyroid Function Tests: No results for input(s): TSH, T4TOTAL, FREET4, T3FREE, THYROIDAB in the last 72 hours. Anemia Panel: No results for input(s): VITAMINB12, FOLATE, FERRITIN, TIBC, IRON, RETICCTPCT in the last 72 hours. Sepsis Labs: No results for input(s): PROCALCITON, LATICACIDVEN in the last 168 hours.  Recent Results (from the past 240 hour(s))  Surgical PCR screen     Status: None   Collection Time: 12/23/16  5:12 AM  Result Value Ref Range Status   MRSA, PCR NEGATIVE NEGATIVE Final   Staphylococcus aureus NEGATIVE NEGATIVE Final    Comment: (NOTE) The Xpert SA Assay (FDA approved for NASAL specimens in patients 69 years of age and older), is one component of a comprehensive surveillance program. It is not intended to diagnose infection nor to guide or monitor treatment.          Radiology Studies: No results found.      Scheduled Meds: . aspirin EC  81 mg Oral Daily  . atorvastatin  80 mg Oral q1800  . calcium acetate  667 mg Oral TID WC  . carvedilol  12.5 mg Oral BID WC  . clopidogrel  75 mg Oral Daily  . docusate sodium  100 mg Oral BID   . furosemide  40 mg Oral BID  . gentamicin cream  1 application Topical Daily  . heparin  5,000 Units Subcutaneous Q8H  . insulin aspart  0-9 Units Subcutaneous TID WC  . insulin aspart  8 Units Subcutaneous TID WC  . insulin glargine  10 Units Subcutaneous NOW  . insulin glargine  35 Units Subcutaneous BID  . isosorbide mononitrate  30 mg Oral Daily  . lactulose  30 g Oral BID  . losartan  25 mg Oral QHS  . mouth rinse  15 mL Mouth Rinse BID  . pantoprazole  40 mg Oral Daily  . polyethylene glycol  17 g Oral Daily  . potassium chloride  20 mEq Oral BID  . triamcinolone   Mouth/Throat TID  . [START ON 01/08/2017] Vitamin D (Ergocalciferol)  50,000 Units Oral Q30 days   Continuous Infusions: . sodium chloride 10 mL/hr at 12/23/16 0812  .  sodium chloride 10 mL/hr at 12/24/16 0142  . dialysis solution 1.5% low-MG/low-CA    . dialysis solution 1.5% low-MG/low-CA    . dialysis solution 2.5% low-MG/low-CA    . methocarbamol (ROBAXIN)  IV       LOS: 12 days    Dron Jaynie Collins, MD [facility] Pager [phone]  If 7PM-7AM, please contact night-coverage [email or web address] Password [email or web address] 12/27/2016, 10:35 AM

## 2016-12-27 NOTE — Progress Notes (Signed)
Marcello Fennel, MD Physician Signed Physical Medicine and Rehabilitation  Consult Note Date of Service: 12/26/2016 9:28 AM  Related encounter: Admission (Current) from 12/15/2016 in [facility] 4E CV SURGICAL PROGRESSIVE CARE     Expand All Collapse All   Hide copied text Hover for attribution information      Physical Medicine and Rehabilitation Consult   Reason for Consult:  L-transmetatarsal amputation with debility due to multiple recent medical issues.  Referring Physician: Dr. Randie Heinz.    HPI: [name] is a 76 y.o. male with history of COPD, T2DM, CAD s/p ICD, ESRD- on PD,  PAD with critical limb ischemia with non-healing LLE wound who was admitted for right femoral pseudoaneurysm repair and resection of Left 3rd MT head on 12/15/16 by Dr. Randie Heinz. He failed attempts at limb salvage and hospital course complicated by N/V, leucocytosis and fever likely due to sepsis. GB ultrasound revealed GB polyp without acute cholecystitis. He has had issues with right groin wound dehiscence of wound with gangrenous changes left foot. He  required left transmetatarsal amputation with VAC placement by Dr. Lajoyce Corners on 9/14. Post op NWB LLE and antibiotics discontinued.   Dr. Pollyann Kennedy consulted for input on sore throat, difficulty swallowing and oral pharyngeal ulcerations. He recommended supportive care for viral v/s antibiotics related ulcers. Right groin with small hematoma and decrease in erythema per reports. CIR recommended due to deficits in mobility and decreased ability to carry out ADL tasks.    Review of Systems  Constitutional: Negative for chills and fever.  HENT: Negative for hearing loss and tinnitus.   Eyes: Negative for blurred vision and double vision.  Respiratory: Negative for cough, sputum production and shortness of breath.   Cardiovascular: Negative for chest pain and palpitations.  Gastrointestinal: Positive for constipation (no BM since last thurs). Negative for abdominal  pain, heartburn and nausea.  Musculoskeletal: Positive for back pain, joint pain and myalgias. Negative for neck pain.  Skin: Negative for rash.  Neurological: Positive for sensory change (BLE). Negative for dizziness and headaches.  Psychiatric/Behavioral: The patient does not have insomnia.   All other systems reviewed and are negative.         Past Medical History:  Diagnosis Date  . AICD (automatic cardioverter/defibrillator) present   . CHF (congestive heart failure) (HCC)   . COPD (chronic obstructive pulmonary disease) (HCC)   . Coronary artery disease    CABG 2011  . Diabetes mellitus without complication (HCC)   . ESRD on peritoneal dialysis [facility])    Started PD Dec 2016 in [place]. Nephrology is in [place].   [name] History of peritonitis    PD cath related peritonitis in April 2017  . Hypertension   . Peripheral vascular disease (HCC)   . Sjogren's syndrome (HCC)    Per pt's wife, was diagnosed in 2016 during w/u for cause of renal failure prior to starting dialysis.  He had a rash on his chest apparently prompting this w/u.  Never had renal bx.  He was referred to a rheum MD in [place] and per the wife he was treated with MTX and other medications but had side effects to "all of it" and isn't taking anything for this as of Jun 2017.   . Stroke [facility]          Past Surgical History:  Procedure Laterality Date  . ABDOMINAL AORTOGRAM W/LOWER EXTREMITY N/A 11/29/2016   Procedure: ABDOMINAL AORTOGRAM W/LOWER EXTREMITY;  Surgeon: Nada Libman, MD;  Location: [facility] INVASIVE  CV LAB;  Service: Cardiovascular;  Laterality: N/A;  . AMPUTATION Left 11/29/2016   Procedure: LEFT THIRD TOE AMPUTATION;  Surgeon: Nada Libman, MD;  Location: [facility] OR;  Service: Vascular;  Laterality: Left;  . AMPUTATION Left 12/23/2016   Procedure: Left Transmetatarsal Amputation;  Surgeon: Nadara Mustard, MD;  Location: [facility] OR;  Service: Orthopedics;  Laterality: Left;  .  ANGIOPLASTY  12/15/2016   Procedure: BALLOON ANGIOPLASTY;  Surgeon: Maeola Harman, MD;  Location: [facility] OR;  Service: Vascular;;  . cardiac catherization with stent placement  2017  . CORONARY ARTERY BYPASS GRAFT  2011  . FALSE ANEURYSM REPAIR Right 12/15/2016   Procedure: REPAIR FALSE ANEURYSM;  Surgeon: Maeola Harman, MD;  Location: [facility] OR;  Service: Vascular;  Laterality: Right;  . IRRIGATION AND DEBRIDEMENT FOOT Left 12/15/2016   Procedure: IRRIGATION AND DEBRIDEMENT FOOT;  Surgeon: Maeola Harman, MD;  Location: [facility] OR;  Service: Vascular;  Laterality: Left;  . LOWER EXTREMITY ANGIOGRAM Left 12/15/2016   Procedure: LOWER EXTREMITY ANGIOGRAM; PEDAL ACCESS;  Surgeon: Maeola Harman, MD;  Location: [facility] OR;  Service: Vascular;  Laterality: Left;  . PERIPHERAL VASCULAR BALLOON ANGIOPLASTY  11/29/2016   Procedure: PERIPHERAL VASCULAR BALLOON ANGIOPLASTY;  Surgeon: Nada Libman, MD;  Location: [facility] INVASIVE CV LAB;  Service: Cardiovascular;;  LT Peroneal AT attempted unsuccessful         Family History  Problem Relation Age of Onset  . Heart disease Father     Social History:  Married. Retired Curator. Wife supportive and can provide assistance after discharge. He  reports that he has quit smoking 25 years ago. He has never used smokeless tobacco. He reports that he does not drink alcohol or use drugs.         Allergies  Allergen Reactions  . Lisinopril Hives  . Methotrexate Derivatives Other (See Comments)    blisters  . Sulfa Antibiotics Hives  . Tape Hives  . Vancomycin Other (See Comments)    Blisters           Medications Prior to Admission  Medication Sig Dispense Refill  . amoxicillin (AMOXIL) 500 MG capsule Take 2,000 mg by mouth See admin instructions. 1 hour prior to dental procedures    . aspirin EC 81 MG tablet Take 81 mg by mouth daily.    [name] atorvastatin (LIPITOR) 80 MG tablet Take 80 mg by mouth daily  at 6 PM.    . calcium acetate (PHOSLO) 667 MG capsule Take 667 mg by mouth 3 (three) times daily with meals.    . carvedilol (COREG) 12.5 MG tablet Take 12.5 mg by mouth 2 (two) times daily with a meal.    . clopidogrel (PLAVIX) 75 MG tablet Take 75 mg by mouth daily.    [name] docusate sodium (COLACE) 100 MG capsule Take 100 mg by mouth 2 (two) times daily.     . furosemide (LASIX) 40 MG tablet Take 40 mg by mouth 2 (two) times daily.    . insulin NPH Human (HUMULIN N,NOVOLIN N) 100 UNIT/ML injection Inject 70 Units into the skin at bedtime.    . insulin regular (NOVOLIN R,HUMULIN R) 100 units/mL injection Inject 20 Units into the skin 3 (three) times daily before meals.    . isosorbide mononitrate (IMDUR) 30 MG 24 hr tablet Take 30 mg by mouth daily.    [name] losartan (COZAAR) 25 MG tablet Take 25 mg by mouth at bedtime.     . nitroGLYCERIN (NITROSTAT) 0.4  MG SL tablet Place 0.4 mg under the tongue every 5 (five) minutes as needed for chest pain.    [name] ondansetron (ZOFRAN) 4 MG tablet Take 4 mg by mouth every 6 (six) hours as needed for nausea or vomiting.    [name] oxyCODONE-acetaminophen (PERCOCET/ROXICET) 5-325 MG tablet Take 1 tablet by mouth every 6 (six) hours as needed for severe pain. 20 tablet 0  . Vitamin D, Ergocalciferol, (DRISDOL) 50000 units CAPS capsule Take 50,000 Units by mouth every 30 (thirty) days.       Home: Home Living Family/patient expects to be discharged to:: Private residence Living Arrangements: Spouse/significant other Available Help at Discharge: Family, Available 24 hours/day Type of Home: Mobile home Home Access: Stairs to enter [employer] of Steps: 1 Entrance Stairs-Rails: Left Home Layout: One level Bathroom Shower/Tub: Psychologist, counselling, Sport and exercise psychologist: Standard Home Equipment: Environmental consultant - 4 wheels, Medical laboratory scientific officer - quad, Wheelchair - manual  Functional History: Prior Function Level of Independence: Needs assistance Gait / Transfers  Assistance Needed: uses [name], RW ADL's / Homemaking Assistance Needed: A for socks, can bath self in shower Functional Status:  Mobility: Bed Mobility Overal bed mobility: Needs Assistance Bed Mobility: Supine to Sit Supine to sit: Min assist, HOB elevated Sit to supine: Min assist General bed mobility comments: assist to elevate trunk into sitting. Use of rail Transfers Overall transfer level: Needs assistance Equipment used: None Transfers: Lateral/Scoot Transfers Sit to Stand: +2 safety/equipment, Min assist Stand pivot transfers: Min assist, From elevated surface  Lateral/Scoot Transfers: Min assist General transfer comment: going to his right with minimal use of bed pad Ambulation/Gait Ambulation/Gait assistance: +2 safety/equipment, Min assist Ambulation Distance (Feet): 40 Feet Assistive device: Rolling walker (2 wheeled) Gait Pattern/deviations: Step-to pattern, Decreased stance time - left, Decreased step length - right, Decreased weight shift to left, Trunk flexed General Gait Details: cues for posture and proximity of RW to aid in limiting weight bearing on L LE; pt with less difficulty maintaining weight mostly on L heel with tennis shoe donned on R Gait velocity: decreased  ADL: ADL Overall ADL's : Needs assistance/impaired Eating/Feeding: Sitting, Independent Eating/Feeding Details (indicate cue type and reason): Pt can independently feed himself but was having his wife feed him on OT arrival.  Grooming: Set up, Sitting Grooming Details (indicate cue type and reason): Becomes winded when completing seated ADL tasks.  Upper Body Bathing: Supervision/ safety, Sitting Upper Body Bathing Details (indicate cue type and reason): Fatigues easily Lower Body Bathing: Maximal assistance Lower Body Bathing Details (indicate cue type and reason): min A +2 sit<>stand Upper Body Dressing : Set up, Sitting Lower Body Dressing: Maximal assistance, Sit to/from stand Lower Body  Dressing Details (indicate cue type and reason): Able to assist therapist to doff Darco shoe Toilet Transfer: Minimal assistance Toilet Transfer Details (indicate cue type and reason): lateral scoot from bed (slightly raised) to drop arm recliner Toileting- Clothing Manipulation and Hygiene: Moderate assistance Toileting - Clothing Manipulation Details (indicate cue type and reason): Min A +2 sit<>stand General ADL Comments: session focused on HEP for BUE in preparation for increased dependence on UE for support during mobility  Cognition: Cognition Overall Cognitive Status: Within Functional Limits for tasks assessed Orientation Level: Oriented X4 Cognition Arousal/Alertness: Awake/alert Behavior During Therapy: WFL for tasks assessed/performed Overall Cognitive Status: Within Functional Limits for tasks assessed  Blood pressure (!) 104/56, pulse 92, temperature (!) 97.4 F (36.3 C), temperature source Oral, resp. rate 20, height  (1.753 m), weight 94.1 kg (207  lb 7.3 oz), SpO2 96 %. Physical Exam  Nursing note and vitals reviewed. Constitutional: He is oriented to person, place, and time. He appears well-developed and well-nourished.  HENT:  Head: Normocephalic and atraumatic.  Mouth/Throat: Oropharynx is clear and moist.  Eyes: Pupils are equal, round, and reactive to light. Conjunctivae and EOM are normal. Scleral icterus is present.  Neck: Normal range of motion. Neck supple.  Cardiovascular: Normal rate and regular rhythm.   Murmur heard. Respiratory: Effort normal and breath sounds normal. No stridor. No respiratory distress. He has no wheezes.  GI: Soft. Bowel sounds are normal. He exhibits no distension. There is no tenderness.  Musculoskeletal: He exhibits edema and tenderness.  Min edema BLE.  Right groin with dry dressing. Left foot transmetatarsal site with wound VAC in place.   Neurological: He is alert and oriented to person, place, and time.  Speech clear.    Flat affect and answers questions without difficulty.   Motor: 4+/5 throughout, except left ankle (pain inhibition) Sensation diminished to light touch left ankle  Skin: Skin is warm and dry.  (See H&P)  Psychiatric: He has a normal mood and affect. His behavior is normal. Judgment and thought content normal.    Lab Results Last 24 Hours       Results for orders placed or performed during the hospital encounter of 12/15/16 (from the past 24 hour(s))  Glucose, capillary     Status: Abnormal   Collection Time: 12/25/16 12:01 PM  Result Value Ref Range   Glucose-Capillary 353 (H) 65 - 99 mg/dL   Comment 1 Notify RN   Glucose, capillary     Status: Abnormal   Collection Time: 12/25/16  3:59 PM  Result Value Ref Range   Glucose-Capillary 259 (H) 65 - 99 mg/dL   Comment 1 Notify RN    Comment 2 Document in Chart   Glucose, capillary     Status: Abnormal   Collection Time: 12/25/16  9:14 PM  Result Value Ref Range   Glucose-Capillary 213 (H) 65 - 99 mg/dL   Comment 1 Document in Chart   Glucose, capillary     Status: Abnormal   Collection Time: 12/26/16  6:05 AM  Result Value Ref Range   Glucose-Capillary 356 (H) 65 - 99 mg/dL     Imaging Results (Last 48 hours)  No results found.    Assessment/Plan: Diagnosis: Left transmetatarsal amputation Labs independently reviewed.  Records reviewed and summated above.  1. Does the need for close, 24 hr/day medical supervision in concert with the patient's rehab needs make it unreasonable for this patient to be served in a less intensive setting? Yes  2. Co-Morbidities requiring supervision/potential complications:  COPD (monitor O2 Sats and RR with increased activity), T2 DM with uncontrolled CBGs >400(Monitor in accordance with exercise and adjust meds as necessary), CAD s/p ICD (cone meds), ESRD- on PD (recs per Neprho),  PAD (cont meds), N/V (meds), leukocytosis (cont to monitor for signs and symptoms of infection,  further workup if indicated), fever (cont to monitor), tachypnea (monitor RR and O2 Sats with increased physical exertion) 3. Due to bowel management, safety, skin/wound care, disease management, pain management and patient education, does the patient require 24 hr/day rehab nursing? Yes 4. Does the patient require coordinated care of a physician, rehab nurse, PT (1-2 hrs/day, 5 days/week) and OT (1-2 hrs/day, 5 days/week) to address physical and functional deficits in the context of the above medical diagnosis(es)? Yes Addressing deficits in the following areas:  balance, endurance, locomotion, strength, transferring, bathing, dressing, feeding, grooming and psychosocial support 5. Can the patient actively participate in an intensive therapy program of at least 3 hrs of therapy per day at least 5 days per week? Yes 6. The potential for patient to make measurable gains while on inpatient rehab is excellent 7. Anticipated functional outcomes upon discharge from inpatient rehab are modified independent and supervision  with PT, modified independent and supervision with OT, n/a with SLP. 8. Estimated rehab length of stay to reach the above functional goals is: 14-17 days. 9. Anticipated D/C setting: Home 10. Anticipated post D/C treatments: HH therapy and Home excercise program 11. Overall Rehab/Functional Prognosis: excellent  RECOMMENDATIONS: This patient's condition is appropriate for continued rehabilitative care in the following setting: CIR Patient has agreed to participate in recommended program. Yes Note that insurance prior authorization may be required for reimbursement for recommended care.  Comment: Rehab Admissions Coordinator to follow up.  Maryla Morrow, MD, ABPMR Jacquelynn Cree, [place] 12/26/2016    Revision History                   Routing History

## 2016-12-28 ENCOUNTER — Inpatient Hospital Stay (HOSPITAL_COMMUNITY): Payer: Medicare Other | Admitting: Occupational Therapy

## 2016-12-28 ENCOUNTER — Inpatient Hospital Stay (HOSPITAL_COMMUNITY): Payer: Medicare Other | Admitting: Physical Therapy

## 2016-12-28 DIAGNOSIS — D72829 Elevated white blood cell count, unspecified: Secondary | ICD-10-CM

## 2016-12-28 DIAGNOSIS — IMO0002 Reserved for concepts with insufficient information to code with codable children: Secondary | ICD-10-CM

## 2016-12-28 DIAGNOSIS — E1165 Type 2 diabetes mellitus with hyperglycemia: Secondary | ICD-10-CM

## 2016-12-28 DIAGNOSIS — E1151 Type 2 diabetes mellitus with diabetic peripheral angiopathy without gangrene: Secondary | ICD-10-CM

## 2016-12-28 DIAGNOSIS — E1142 Type 2 diabetes mellitus with diabetic polyneuropathy: Secondary | ICD-10-CM

## 2016-12-28 DIAGNOSIS — R7309 Other abnormal glucose: Secondary | ICD-10-CM | POA: Insufficient documentation

## 2016-12-28 LAB — GLUCOSE, CAPILLARY
GLUCOSE-CAPILLARY: 287 mg/dL — AB (ref 65–99)
Glucose-Capillary: 394 mg/dL — ABNORMAL HIGH (ref 65–99)
Glucose-Capillary: 175 mg/dL — ABNORMAL HIGH (ref 65–99)
Glucose-Capillary: 120 mg/dL — ABNORMAL HIGH (ref 65–99)

## 2016-12-28 MED ORDER — TRIAMCINOLONE ACETONIDE 0.1 % MT PSTE: PASTE | Freq: Three times a day (TID) | OROMUCOSAL | Status: DC | PRN

## 2016-12-28 NOTE — Progress Notes (Signed)
Patient was in his bed awake. Family, Wife and other members present on-site. Chaplain Faven Watterson shared about the advance directives which patient and wife declined. They later said they will both need a day or two to decide. Chaplain offered reflective listening and compassionate presence

## 2016-12-28 NOTE — Progress Notes (Signed)
Physical Therapy Assessment and Plan  Patient Details  Name: [name] MRN: [id number] Date of Birth: [birth year]  PT Diagnosis: Difficulty walking and Muscle weakness Rehab Potential: Good ELOS: 12-14 days   Today's Date: 12/28/2016 PT Individual Time: 0272-5366 PT Individual Time Calculation (min): 75 min    Problem List:  Patient Active Problem List   Diagnosis Date Noted  . Debility 12/27/2016  . History of transmetatarsal amputation of left foot ([name]) 12/27/2016  . Constipation   . Chronic obstructive pulmonary disease ([name])   . Nausea   . ESRD on dialysis ([name])   . Leukocytosis   . Tachypnea   . FUO (fever of unknown origin)   . Encephalopathy 12/23/2016  . Sore throat 12/23/2016  . Gangrene of left foot ([name]) 11/26/2016  . PAD (peripheral artery disease) ([name]) 11/26/2016  . Sjogren's syndrome ([name]) 09/26/2015  . Uncontrolled type 2 diabetes mellitus with hyperglycemia, with long-term current use of insulin ([name]) 09/26/2015  . Hyperosmolar non-ketotic state in patient with type 2 diabetes mellitus ([name]) 09/25/2015  . Ileus ([name]) 09/22/2015  . Acute respiratory failure with hypoxia ([name]) 09/22/2015  . Acute gouty arthritis 09/22/2015  . CAD (coronary artery disease) of artery bypass graft 09/22/2015  . Sepsis ([place]) 09/21/2015  . Elevated troponin level 09/21/2015  . ESRD on peritoneal dialysis ([place]) 09/21/2015  . CVA (cerebral infarction) 09/21/2015    Past Medical History:  Past Medical History:  Diagnosis Date  . AICD (automatic cardioverter/defibrillator) present   . CHF (congestive heart failure) ([place])   . COPD (chronic obstructive pulmonary disease) ([place])   . Coronary artery disease    CABG 2011  . Diabetes mellitus without complication ([name])   . ESRD on peritoneal dialysis [name])    Started PD Dec 2016 in [place]. Nephrology is in [place].   [name] History of peritonitis    PD cath related peritonitis in April 2017  . Hypertension   . Peripheral  vascular disease ([place])   . Sjogren's syndrome ([place])    Per pt's wife, was diagnosed in 2016 during w/u for cause of renal failure prior to starting dialysis.  He had a rash on his chest apparently prompting this w/u.  Never had renal bx.  He was referred to a rheum MD in [name] and per the wife he was treated with MTX and other medications but had side effects to "all of it" and isn't taking anything for this as of Jun 2017.   . Stroke [name])    Past Surgical History:  Past Surgical History:  Procedure Laterality Date  . ABDOMINAL AORTOGRAM W/LOWER EXTREMITY N/A 11/29/2016   Procedure: ABDOMINAL AORTOGRAM W/LOWER EXTREMITY;  Surgeon: Serafina Mitchell, MD;  Location: [name];  Service: Cardiovascular;  Laterality: N/A;  . AMPUTATION Left 11/29/2016   Procedure: LEFT THIRD TOE AMPUTATION;  Surgeon: Serafina Mitchell, MD;  Location: [name];  Service: Vascular;  Laterality: Left;  . AMPUTATION Left 12/23/2016   Procedure: Left Transmetatarsal Amputation;  Surgeon: Newt Minion, MD;  Location: [name];  Service: Orthopedics;  Laterality: Left;  . ANGIOPLASTY  12/15/2016   Procedure: BALLOON ANGIOPLASTY;  Surgeon: Waynetta Sandy, MD;  Location: [facility];  Service: Vascular;;  . cardiac catherization with stent placement  2017  . CORONARY ARTERY BYPASS GRAFT  2011  . FALSE ANEURYSM REPAIR Right 12/15/2016   Procedure: REPAIR FALSE ANEURYSM;  Surgeon: Waynetta Sandy, MD;  Location: [name];  Service: Vascular;  Laterality: Right;  .  IRRIGATION AND DEBRIDEMENT FOOT Left 12/15/2016   Procedure: IRRIGATION AND DEBRIDEMENT FOOT;  Surgeon: Waynetta Sandy, MD;  Location: [name];  Service: Vascular;  Laterality: Left;  . LOWER EXTREMITY ANGIOGRAM Left 12/15/2016   Procedure: LOWER EXTREMITY ANGIOGRAM; PEDAL ACCESS;  Surgeon: Waynetta Sandy, MD;  Location: [facility];  Service: Vascular;  Laterality: Left;  . PERIPHERAL VASCULAR BALLOON ANGIOPLASTY  11/29/2016   Procedure:  PERIPHERAL VASCULAR BALLOON ANGIOPLASTY;  Surgeon: Serafina Mitchell, MD;  Location: [facility];  Service: Cardiovascular;;  LT Peroneal AT attempted unsuccessful    Assessment & Plan Clinical Impression: Patient is a 76 y.o.malewith history of COPD, T2DM, CAD s/p ICD, ESRD- on PD, PAD with critical limb ischemia with non-healing LLE wound who was admitted for right femoral pseudoaneurysm repair and resection of Left 3rd MT head on 12/15/16 by Dr. Donzetta Matters. He failed attempts at limb salvage and hospital course complicated by N/V, leucocytosis and fever likely due to sepsis. GB ultrasound revealed GB polyp without acute cholecystitis. He has had issues with right groin wound dehiscence of wound with gangrenous changes left foot. 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 Sharol Given on 9/14. Post op NWB LLE and antibiotics discontinued.   Dr. Constance Holster consulted for input on sore throat, difficulty swallowing and oral pharyngeal ulcerations. He recommended supportive care for viral v/s antibiotics related ulcers. Right groin with small hematoma and decrease in erythema. CIR recommended due to deficits in mobility and decreased ability to carry out ADL tasks. Patient transferred to CIR on 12/27/2016 .   Patient currently requires mod with mobility secondary to muscle weakness and decreased sitting balance, decreased standing balance and decreased postural control.  Prior to hospitalization, patient was independent  with mobility and lived with Spouse in a Mobile home home.  Home access is 1Stairs to enter.  Patient will benefit from skilled PT intervention to maximize safe functional mobility and minimize fall risk for planned discharge home with intermittent assist.  Anticipate patient will benefit from follow up [facility] at discharge.  PT - End of Session Activity Tolerance: Tolerates 30+ min activity with multiple rests Endurance Deficit: Yes PT Assessment Rehab Potential (ACUTE/IP  ONLY): Good PT Patient demonstrates impairments in the following area(s): Balance;Endurance;Motor PT Transfers Functional Problem(s): Bed Mobility;Bed to Chair;Car;Furniture;Floor PT Locomotion Functional Problem(s): Ambulation;Wheelchair Mobility;Stairs PT Plan PT Intensity: Minimum of 1-2 x/day ,45 to 90 minutes PT Frequency: 5 out of 7 days PT Duration Estimated Length of Stay: 12-14 days PT Treatment/Interventions: Ambulation/gait training;Community reintegration;Discharge planning;Disease management/prevention;DME/adaptive equipment instruction;Functional mobility training;Neuromuscular re-education;Pain management;Patient/family education;Psychosocial support;Skin care/wound management;Stair training;Therapeutic Activities;Therapeutic Exercise;UE/LE Strength taining/ROM;Wheelchair propulsion/positioning PT Transfers Anticipated Outcome(s): Mod I transfers PT Locomotion Anticipated Outcome(s): supervison short distances, w/c for community access PT Recommendation Recommendations for Other Services: Therapeutic Recreation consult Therapeutic Recreation Interventions: Outing/community reintergration Follow Up Recommendations: Home health PT Patient destination: Home Equipment Recommended: Rolling walker with 5" wheels Equipment Details: w/c needs TBD  Skilled Therapeutic Intervention Pt seen for initial evaluation and treatment. At the time of the evaluation the pt was on peritoneal dialysis which limited mobility options. Treatment included bed mobility and supine<>sitting transfers X2. While sitting EOB worked on balance and LE strengthening exercises. Pt did fatigue quickly and returned to supine for breaks. Following session, pt returned to supine with spouse present and all needs in reach.   PT Evaluation Precautions/Restrictions Precautions Precautions: Fall Required Braces or Orthoses: Other Brace/Splint Other Brace/Splint: post op shoe Lt  Restrictions Weight Bearing  Restrictions:  Yes LLE Weight Bearing: Non weight bearing General   Vital Signs  Pain Pt denies pain at this time.   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 ) Comments: reports hobbling around the house, using rollator as able. Legs would "give out" at times but denies having any falls.  Vision/Perception  Perception Perception: Within Functional Limits  Cognition Overall Cognitive Status: Within Functional Limits for tasks assessed Arousal/Alertness: Awake/alert Orientation Level: Oriented X4 Attention: Focused Focused Attention: Appears intact Memory: Appears intact Awareness: Appears intact Safety/Judgment: Appears intact Sensation Sensation Light Touch: Appears Intact Coordination Gross Motor Movements are Fluid and Coordinated: Yes Motor  Motor Motor: Within Functional Limits Motor - Skilled Clinical Observations: generalized weakness     Balance Balance Balance Assessed: Yes Static Sitting Balance Static Sitting - Balance Support: Left upper extremity supported Static Sitting - Comment/# of Minutes: supervision with sitting static EOB Dynamic Sitting Balance Dynamic Sitting - Balance Support: Bilateral upper extremity supported Dynamic Sitting - Level of Assistance: 4: Min assist Extremity Assessment  RUE Assessment RUE Assessment:  (generalized weakness) LUE Assessment LUE Assessment:  (generalized weakness) RLE Strength RLE Overall Strength Comments: Grossly 4/5 with knee extension, hip  flexion, dorsiflexion.  LLE Strength LLE Overall Strength Comments: 3+/5 knee extension and hip flexion   See Function Navigator for Current Functional Status.   Refer to Care Plan for Long Term Goals  Recommendations for other services: Therapeutic Recreation  Outing/community reintegration  Discharge Criteria: Patient will be discharged from PT if patient refuses treatment 3 consecutive times without medical reason, if treatment goals not met, if there is a change in medical status, if patient makes no progress towards goals or if patient is discharged from hospital.  The above assessment, treatment plan, treatment alternatives and goals were discussed and mutually agreed upon: by patient and by family.  Linard Millers, PT 12/28/2016, 10:54 AM

## 2016-12-28 NOTE — Procedures (Signed)
I was present at this dialysis session. I have reviewed the session itself and made appropriate changes.   Still on machine at 0900, started around 2000 last evening.  Will try to start earlier to permit more CIR time.    Labs stable. Hb stable.  Cont 2.5L 2.5%, dwell. If still taking too long will make last dwell a last fill.  Will need labs TIW.    Filed Weights   12/27/16 1443 12/28/16 0600  Weight: 90.7 kg (200 lb) 91.3 kg (201 lb 4.5 oz)     Recent Labs Lab 12/27/16 0900  NA 131*  K 3.4*  CL 96*  CO2 28  GLUCOSE 350*  BUN 26*  CREATININE 6.31*  CALCIUM 8.3*  PHOS 3.0     Recent Labs Lab 12/23/16 0652 12/27/16 0900  WBC 10.5 13.1*  HGB 10.3* 10.6*  HCT 31.6* 32.8*  MCV 94.9 95.6  PLT 308 357    Scheduled Meds: . aspirin EC  81 mg Oral Daily  . atorvastatin  80 mg Oral q1800  . calcium acetate  667 mg Oral TID WC  . carvedilol  12.5 mg Oral BID WC  . clopidogrel  75 mg Oral Daily  . docusate sodium  200 mg Oral BID  . feeding supplement (NEPRO CARB STEADY)  237 mL Oral TID WC  . furosemide  40 mg Oral BID  . gabapentin  100 mg Oral QHS  . gentamicin cream  1 application Topical Daily  . heparin  5,000 Units Subcutaneous Q8H  . Influenza vac split quadrivalent PF  0.5 mL Intramuscular Tomorrow-1000  . insulin aspart  0-9 Units Subcutaneous TID WC  . insulin aspart  8 Units Subcutaneous TID WC  . insulin glargine  25 Units Subcutaneous BID  . isosorbide mononitrate  30 mg Oral Daily  . losartan  25 mg Oral QHS  . mouth rinse  15 mL Mouth Rinse BID  . multivitamin  1 tablet Oral QHS  . pantoprazole  40 mg Oral Daily  . potassium chloride  20 mEq Oral BID  . triamcinolone   Mouth/Throat TID  . [START ON 01/08/2017] Vitamin D (Ergocalciferol)  50,000 Units Oral Q30 days   Continuous Infusions: . dialysis solution 1.5% low-MG/low-CA    . dialysis solution 2.5% low-MG/low-CA     PRN Meds:.acetaminophen, alum & mag hydroxide-simeth, bisacodyl,  diphenhydrAMINE, guaiFENesin-dextromethorphan, dianeal solution for CAPD/CCPD with heparin, dianeal solution for CAPD/CCPD with heparin, magic mouthwash, methocarbamol, nitroGLYCERIN, ondansetron **OR** ondansetron (ZOFRAN) IV, oxyCODONE, oxyCODONE, phenol, polyethylene glycol, prochlorperazine **OR** prochlorperazine **OR** prochlorperazine, sodium phosphate, sorbitol, traMADol, traZODone   Sabra Heck  MD 12/28/2016, 9:08 AM

## 2016-12-28 NOTE — Progress Notes (Signed)
Physical Therapy Session Note  Patient Details  Name: [name] MRN: [id number] Date of Birth: 05-09-1940  Today's Date: 12/28/2016 PT Individual Time: 1000-1030 PT Individual Time Calculation (min): 30 min   Short Term Goals: Week 1:  PT Short Term Goal 1 (Week 1): Pt to be mod I with supine<>sitting. PT Short Term Goal 2 (Week 1): Sit<>stand to with min assist and LRAD PT Short Term Goal 3 (Week 1): Pt to ambulate 5 ft with LRAD PT Short Term Goal 4 (Week 1): Up/down 1 step with mod assist.   Skilled Therapeutic Interventions/Progress Updates:  No c/o pain but fatigued.  Session focus on sitting balance and activity tolerance.  Pt transitions sit<>supine with supervision and increased time with hospital bed functions.  Static sitting balance with up to mod assist during UE therex with 1# dowel rod for chest press, shoulder press, shoulder rows, and bicep curls 2x15 reps each, 10 reps each of ankle pumps and quad sets.  Remains supine at end of session with call bel in reach and needs met.   Therapy Documentation Precautions:  Precautions Precautions: Fall Required Braces or Orthoses: Other Brace/Splint Other Brace/Splint: post op shoe Lt  Restrictions Weight Bearing Restrictions: Yes LLE Weight Bearing: Non weight bearing   See Function Navigator for Current Functional Status.   Therapy/Group: Individual Therapy  Michel Santee 12/28/2016, 11:37 AM

## 2016-12-28 NOTE — Progress Notes (Signed)
Occupational Therapy Note  Patient Details  Name: OLIS VIVERETTE MRN: [id number] Date of Birth: October 06, 1940  Today's Date: 12/28/2016 OT Individual Time: 1430-1500 OT Individual Time Calculation (min): 30 min   Upon entering the room, pt seated in wheelchair with wife present in room. Skilled OT intervention with focus on problem solving toilet transfer. OT assisted pt into bathroom via wheelchair. Commode chair elevated in order to assist pt with maintaining NWB on L LE during transfer. Pt required cues for hand placement with max A squat pivot for lifting and lowering wheelchair <>toilet transfer. Pt declined practice for clothing management but therapist educated pt on variety of ways this could be attempted. Pt returning to wheelchair and remaining up with call bell and all needed items within reach upon exiting the room.    Jackquline Denmark P 12/28/2016, 4:09 PM

## 2016-12-28 NOTE — Discharge Instructions (Signed)
Inpatient Rehab Discharge Instructions  [name] Discharge date and time:    Activities/Precautions/ Functional Status: Activity: no lifting, driving, or strenuous exercise for till cleared by MD. No weight on left foot Diet: diabetic diet   Wound Care:  --Contact MD if you develop any problems with your incision/wound--redness, swelling, increase in pain, drainage or if you develop fever or chills.    Functional status:  ___ No restrictions     ___ Walk up steps independently ___ 24/7 supervision/assistance   ___ Walk up steps with assistance ___ Intermittent supervision/assistance  ___ Bathe/dress independently ___ Walk with walker     ___ Bathe/dress with assistance ___ Walk Independently    ___ Shower independently ___ Walk with assistance    ___ Shower with assistance ___ No alcohol     ___ Return to work/school ________  Special Instructions:    My questions have been answered and I understand these instructions. I will adhere to these goals and the provided educational materials after my discharge from the hospital.  Patient/Caregiver Signature _______________________________ Date __________  Clinician Signature _______________________________________ Date __________  Please bring this form and your medication list with you to all your follow-up doctor's appointments.

## 2016-12-28 NOTE — Evaluation (Addendum)
Occupational Therapy Assessment and Plan  Patient Details  Name: [name] MRN: [id number] Date of Birth: 02/20/1941  OT Diagnosis: muscle weakness (generalized) Rehab Potential: Rehab Potential (ACUTE ONLY): Good ELOS: ~12-14 days   Today's Date: 12/28/2016 OT Individual Time: 1300-1415 OT Individual Time Calculation (min): 75 min     Problem List:  Patient Active Problem List   Diagnosis Date Noted  . Labile blood glucose   . Uncontrolled diabetes mellitus type 2 with peripheral artery disease ([place])   . Diabetic peripheral neuropathy ([place])   . Debility 12/27/2016  . History of transmetatarsal amputation of left foot ([place]) 12/27/2016  . Constipation   . Chronic obstructive pulmonary disease ([place])   . Nausea   . ESRD on dialysis ([place])   . Leukocytosis   . Tachypnea   . FUO (fever of unknown origin)   . Encephalopathy 12/23/2016  . Sore throat 12/23/2016  . Gangrene of left foot ([place]) 11/26/2016  . PAD (peripheral artery disease) ([place]) 11/26/2016  . Sjogren's syndrome ([place]) 09/26/2015  . Uncontrolled type 2 diabetes mellitus with hyperglycemia, with long-term current use of insulin ([place]) 09/26/2015  . Hyperosmolar non-ketotic state in patient with type 2 diabetes mellitus ([place]) 09/25/2015  . Ileus ([place]) 09/22/2015  . Acute respiratory failure with hypoxia ([place]) 09/22/2015  . Acute gouty arthritis 09/22/2015  . CAD (coronary artery disease) of artery bypass graft 09/22/2015  . Sepsis ([place]) 09/21/2015  . Elevated troponin level 09/21/2015  . ESRD on peritoneal dialysis ([place]) 09/21/2015  . CVA (cerebral infarction) 09/21/2015    Past Medical History:  Past Medical History:  Diagnosis Date  . AICD (automatic cardioverter/defibrillator) present   . CHF (congestive heart failure) ([place])   . COPD (chronic obstructive pulmonary disease) ([place])   . Coronary artery disease    CABG 2011  . Diabetes mellitus without complication ([place])   . ESRD on peritoneal dialysis [facility])     Started PD Dec 2016 in [place]. Nephrology is in [place].   [name] History of peritonitis    PD cath related peritonitis in April 2017  . Hypertension   . Peripheral vascular disease ([place])   . Sjogren's syndrome ([place])    Per pt's wife, was diagnosed in 2016 during w/u for cause of renal failure prior to starting dialysis.  He had a rash on his chest apparently prompting this w/u.  Never had renal bx.  He was referred to a rheum MD in [place] and per the wife he was treated with MTX and other medications but had side effects to "all of it" and isn't taking anything for this as of Jun 2017.   . Stroke [facility])    Past Surgical History:  Past Surgical History:  Procedure Laterality Date  . ABDOMINAL AORTOGRAM W/LOWER EXTREMITY N/A 11/29/2016   Procedure: ABDOMINAL AORTOGRAM W/LOWER EXTREMITY;  Surgeon: Serafina Mitchell, MD;  Location: [facility];  Service: Cardiovascular;  Laterality: N/A;  . AMPUTATION Left 11/29/2016   Procedure: LEFT THIRD TOE AMPUTATION;  Surgeon: Serafina Mitchell, MD;  Location: [facility] OR;  Service: Vascular;  Laterality: Left;  . AMPUTATION Left 12/23/2016   Procedure: Left Transmetatarsal Amputation;  Surgeon: Newt Minion, MD;  Location: [facility];  Service: Orthopedics;  Laterality: Left;  . ANGIOPLASTY  12/15/2016   Procedure: BALLOON ANGIOPLASTY;  Surgeon: Waynetta Sandy, MD;  Location: [facility];  Service: Vascular;;  . cardiac catherization with stent placement  2017  . CORONARY ARTERY BYPASS GRAFT  2011  .  FALSE ANEURYSM REPAIR Right 12/15/2016   Procedure: REPAIR FALSE ANEURYSM;  Surgeon: Waynetta Sandy, MD;  Location: [facility];  Service: Vascular;  Laterality: Right;  . IRRIGATION AND DEBRIDEMENT FOOT Left 12/15/2016   Procedure: IRRIGATION AND DEBRIDEMENT FOOT;  Surgeon: Waynetta Sandy, MD;  Location: [facility];  Service: Vascular;  Laterality: Left;  . LOWER EXTREMITY ANGIOGRAM Left 12/15/2016   Procedure: LOWER EXTREMITY ANGIOGRAM; PEDAL  ACCESS;  Surgeon: Waynetta Sandy, MD;  Location: [facility];  Service: Vascular;  Laterality: Left;  . PERIPHERAL VASCULAR BALLOON ANGIOPLASTY  11/29/2016   Procedure: PERIPHERAL VASCULAR BALLOON ANGIOPLASTY;  Surgeon: Serafina Mitchell, MD;  Location: [facility];  Service: Cardiovascular;;  LT Peroneal AT attempted unsuccessful    Assessment & Plan Clinical Impression: Patient is a 76 y.o. year old male with history of COPD, T2DM, CAD s/p ICD, ESRD- on PD, PAD with critical limb ischemia with non-healing LLE wound who was admitted for right femoral pseudoaneurysm repair and resection of Left 3rd MT head on 12/15/16 by Dr. Donzetta Matters. He failed attempts at limb salvage and hospital course complicated by N/V, leucocytosis and fever likely due to sepsis. GB ultrasound revealed GB polyp without acute cholecystitis. He has had issues with right groin wound dehiscence of wound with gangrenous changes left foot. 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 Sharol Given on 9/14. Post op NWB LLE and antibiotics discontinued.   Dr. Constance Holster consulted for input on sore throat, difficulty swallowing and oral pharyngeal ulcerations. He recommended supportive care for viral v/s antibiotics related ulcers. Right groin with small hematoma and decrease in erythema.  Patient transferred to CIR on 12/27/2016 .    Patient currently requires max with basic self-care skills and functional mobility  secondary to muscle weakness and ability to maintain NWB precautions wtih mobility , decreased cardiorespiratoy endurance and decreased sitting balance, decreased standing balance, decreased balance strategies and difficulty maintaining precautions.  Prior to hospitalization, patient could complete ADL with modified independent .  Patient will benefit from skilled intervention to decrease level of assist with basic self-care skills and increase independence with basic self-care skills prior to discharge home  with care partner.  Anticipate patient will require intermittent supervision and follow up home health and follow up outpatient.  OT - End of Session Activity Tolerance: Tolerates 30+ min activity with multiple rests Endurance Deficit: Yes OT Assessment Rehab Potential (ACUTE ONLY): Good OT Patient demonstrates impairments in the following area(s): Balance;Safety;Skin Integrity;Endurance;Motor OT Basic ADL's Functional Problem(s): Grooming;Bathing;Dressing;Toileting OT Transfers Functional Problem(s): Toilet;Tub/Shower OT Additional Impairment(s): None OT Plan OT Intensity: Minimum of 1-2 x/day, 45 to 90 minutes OT Frequency: 5 out of 7 days OT Duration/Estimated Length of Stay: ~12-14 days OT Recommendation Recommendations for Other Services: Neuropsych consult Patient destination: Home Follow Up Recommendations: Home health OT Equipment Recommended: To be determined  OT Plan OT Self Feeding Anticipated Outcome(s): n/a OT Basic Self-Care Anticipated Outcome(s): supervision/ min A  OT Toileting Anticipated Outcome(s): min A OT Bathroom Transfers Anticipated Outcome(s): supervision    Skilled Therapeutic Intervention Ot eval initiated with Ot goals, purpose and role discussed with pt and pt's wife. SElf care retraining with  Focus on functional transfers, sit to stand and bathing and dressing activities at the sink level due to wound vac. Pt able to come to EOB with min A with more than extra time. Pt laterally scooted bed to w/c with low to no bottom clearance with mod A.  AT sink pt had  endurance to bathe all that he could while sitting but due to fatigue pt did require more A to dress. Total A for Lb dressing.  Engaged in sit to stand 3 times at the sink with max A each time; however with each stand pt demonstrating decr hip and knee terminal extension.   Transitioned to the gym and continued to focus on squat pivot transfers. Pt able to transfer two times- one with max A and the  other with mod A; however both with increased bottom clearance. PT able to propel w/c ~150 feet with more than reasonable amt of time due to decr overall UB strength and endurance.   Left in room with wife to rest before next therapy session.   OT Evaluation Precautions/Restrictions  Precautions Precautions: Fall Precaution Comments: PD port Other Brace/Splint: post op shoe Lt  Restrictions Weight Bearing Restrictions: Yes LLE Weight Bearing: Non weight bearing LLE Partial Weight Bearing Percentage or Pounds: Darco shoe General Chart Reviewed: Yes Family/Caregiver Present: No   Pain  no reports in  [place] expects to be discharged to:: Private residence Living Arrangements: Spouse/significant other Available Help at Discharge: Family, Available 24 hours/day Type of Home: Mobile home Home Access: Stairs to enter Technical brewer of Steps: 1 Entrance Stairs-Rails: Left Home Layout: One level Bathroom Shower/Tub: Gaffer, Charity fundraiser: Standard  Lives With: Spouse Prior Function Level of Independence: Independent with basic ADLs, Independent with transfers, Requires assistive device for independence  Able to Take Stairs?: Yes Vocation: Retired Leisure:  (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 ) Comments: reports hobbling around the house, using rollator as able. Legs would "give out" at times but denies having any falls.  ADL ADL ADL Comments: see functional navigator Vision Baseline Vision/History: Wears glasses Wears Glasses: At all times Patient Visual Report: No change from baseline Vision Assessment?: No apparent visual deficits Perception  Perception: Within Functional Limits Praxis Praxis: Intact Cognition Overall Cognitive Status: Within Functional Limits for tasks assessed Arousal/Alertness: Awake/alert Orientation Level: Person;Place;Situation Person:  Oriented Place: Oriented Situation: Oriented Year: 2018 Month: September Day of Week: Correct Memory: Appears intact Immediate Memory Recall: Sock;Blue;Bed Memory Recall: Sock;Blue;Bed Memory Recall Sock: Without Cue Memory Recall Blue: Without Cue Memory Recall Bed: Without Cue Attention: Focused;Sustained Focused Attention: Appears intact Sustained Attention: Appears intact Awareness: Appears intact Problem Solving: Appears intact Safety/Judgment: Appears intact Sensation Sensation Light Touch: Appears Intact Stereognosis: Appears Intact Hot/Cold: Appears Intact Proprioception: Appears Intact Coordination Gross Motor Movements are Fluid and Coordinated: Yes Fine Motor Movements are Fluid and Coordinated: Yes Motor  Motor Motor: Within Functional Limits Motor - Skilled Clinical Observations: generalized weakness Mobility  Transfers Transfers: Sit to Stand;Stand to Sit Sit to Stand: 2: Max assist Stand to Sit: 2: Max assist  Trunk/Postural Assessment  Cervical Assessment Cervical Assessment: Within Functional Limits Thoracic Assessment Thoracic Assessment: Within Functional Limits Lumbar Assessment Lumbar Assessment: Within Functional Limits Postural Control Postural Control: Within Functional Limits  Balance Static Sitting Balance Static Sitting - Balance Support: No upper extremity supported;Feet unsupported Static Sitting - Level of Assistance: 5: Stand by assistance Dynamic Sitting Balance Dynamic Sitting - Balance Support: During functional activity Dynamic Sitting - Level of Assistance: 4: Min assist Static Standing Balance Static Standing - Level of Assistance: 2: Max assist Dynamic Standing Balance Dynamic Standing - Balance Support: During functional activity Dynamic Standing - Level of Assistance: 2: Max assist Extremity/Trunk Assessment RUE Assessment RUE Assessment:  (generalized weakness  3-/5) LUE Assessment LUE Assessment:  (generalized  weakness overall 3-/5)   See Function Navigator for Current Functional Status.   Refer to Care Plan for Long Term Goals  Recommendations for other services: None    Discharge Criteria: Patient will be discharged from OT if patient refuses treatment 3 consecutive times without medical reason, if treatment goals not met, if there is a change in medical status, if patient makes no progress towards goals or if patient is discharged from hospital.  The above assessment, treatment plan, treatment alternatives and goals were discussed and mutually agreed upon: by patient and by family  Nicoletta Ba 12/28/2016, 8:40 PM

## 2016-12-28 NOTE — Progress Notes (Signed)
[facility] PHYSICAL MEDICINE & REHABILITATION     PROGRESS NOTE  Subjective/Complaints:  Pt seen laying in bed this AM.  He slept well overnight and is ready to begin his day of therapies.   ROS: Denies CP, SOB, N/V/D.  Objective: Vital Signs: Blood pressure 109/65, pulse 80, temperature 98.6 F (37 C), resp. rate 18, height  (1.753 m), weight 91.3 kg (201 lb 4.5 oz), SpO2 95 %. No results found.  Recent Labs  12/27/16 0900  WBC 13.1*  HGB 10.6*  HCT 32.8*  PLT 357    Recent Labs  12/27/16 0900  NA 131*  K 3.4*  CL 96*  GLUCOSE 350*  BUN 26*  CREATININE 6.31*  CALCIUM 8.3*   CBG (last 3)   Recent Labs  12/27/16 2103 12/28/16 0615 12/28/16 1144  GLUCAP 181* 394* 175*    Wt Readings from Last 3 Encounters:  12/28/16 91.3 kg (201 lb 4.5 oz)  12/27/16 89.2 kg (196 lb 10.4 oz)  12/14/16 92.1 kg (203 lb)    Physical Exam:  BP 109/65   Pulse 80   Temp 98.6 F (37 C)   Resp 18   Ht  (1.753 m)   Wt 91.3 kg (201 lb 4.5 oz)   SpO2 95%   BMI 29.72 kg/m  Constitutional: He appears well-developedand well-nourished. No distress.  HENT: Normocephalicand atraumatic.  Eyes: EOMare normal. No discharge. Cardiovascular: Normal rateand regular rhythm. Murmurheard. Respiratory: Effort normaland breath sounds normal.  GI: Bowel sounds are normal. He exhibits no distension.  Musculoskeletal: He exhibits tenderness. He exhibits no edema.  Right groin and left thigh tender.  Neurological: He is alertand oriented.  Motor: B/l UE 5/5 prox to distal.  LLE 3+/5 HF, KE, ADF/PF (pain inhibition).  RLE: 4/5  HF, KE and ADF/PF.  Fair insight and awareness.  Skin: Skin is warmand dry. He is not diaphoretic.  Left TMA Vac in place.  Right groin wound packed  PD sites dressed Psychiatric: He has a normal mood and affect. His behavior is normal. Judgmentand thought contentnormal.    Assessment/Plan: 1. Functional deficits secondary to left TMA  which require 3+ hours per day of interdisciplinary therapy in a comprehensive inpatient rehab setting. Physiatrist is providing close team supervision and 24 hour management of active medical problems listed below. Physiatrist and rehab team continue to assess barriers to discharge/monitor patient progress toward functional and medical goals.  Function:  Bathing Bathing position      Bathing parts      Bathing assist        Upper Body Dressing/Undressing Upper body dressing                    Upper body assist        Lower Body Dressing/Undressing Lower body dressing                                  Lower body assist        Toileting Toileting          Toileting assist     Transfers Chair/bed transfer             Locomotion Ambulation           Wheelchair          Cognition Comprehension Comprehension assist level: Follows complex conversation/direction with no assist  Expression Expression assist level: Expresses complex ideas:  With no assist  Social Interaction Social Interaction assist level: Interacts appropriately with others - No medications needed.  Problem Solving Problem solving assist level: Solves complex problems: Recognizes & self-corrects  Memory Memory assist level: Complete Independence: No helper    Medical Problem List and Plan: 1. Functional and mobility deficits secondary to PAD requiring left TMA, right groin wound, multiple medical  Begin CIR  Notes reviewed 2. DVT Prophylaxis/Anticoagulation: Pharmaceutical: Heparin 3. Pain Management: continue oxycodone prn.  4. Mood: team to provide ego support. LCSW to follow for evaluation and support.  5. Neuropsych: This patient iscapable of making decisions on hisown behalf. 6. Skin/Wound Care: continue VAC left foot (until ?~9/21)  Monitor right groin wound continue dry dressing to groin 7. Fluids/Electrolytes/Nutrition: Monitor I/O. PD in evenings  after therapy. 8. T2DM: used NPH at nights with novolog 20 units tid at home. Monitor BS ac/hs.   Currently onLantus being titrated upwards for tighter control  Continue SSI for elevated BS.   Monitor with increased mobility, uncontrolled and labile at present with highs ~400 9. Constipation: Increased colace to home regimen with sorbitol daily prn.  10. OSA: has been compliant with BIPAP at nights.  11. Leucocytosis:   Afebrile  WBCs 13.1 on 9/18  Cont to monitor 12. Anemia of chronic disease: Continue to monitor.   Hb 10.6 on 9/18  Cont to monitor 13. CADs/p CABG: On ASA/plavix and lipitor 14. History of CHF: Monitor weight daily--fluid mangedwith PD. OnASA, lipitor, coreg and lasix bid.    Filed Weights   12/27/16 1443 12/27/16 1925 12/28/16 0600  Weight: 90.7 kg (200 lb) 89.9 kg (198 lb 3.1 oz) 91.3 kg (201 lb 4.5 oz)   15. ESRD: On PD daily at nights. Tolerating it well at present   LOS (Days) 1 A FACE TO FACE EVALUATION WAS PERFORMED  Ankit Karis Juba 12/28/2016 1:13 PM

## 2016-12-28 NOTE — Progress Notes (Signed)
Pt used home CPAP.  Pt stated he administers CPAP on his own.

## 2016-12-28 NOTE — Progress Notes (Signed)
Patient information reviewed and entered into eRehab system by Dacoda Finlay, RN, CRRN, PPS Coordinator.  Information including medical coding and functional independence measure will be reviewed and updated through discharge.     Per nursing patient was given "Data Collection Information Summary for Patients in Inpatient Rehabilitation Facilities with attached "Privacy Act Statement-Health Care Records" upon admission.  

## 2016-12-29 ENCOUNTER — Inpatient Hospital Stay (HOSPITAL_COMMUNITY): Payer: Medicare Other | Admitting: Physical Therapy

## 2016-12-29 ENCOUNTER — Inpatient Hospital Stay (HOSPITAL_COMMUNITY): Payer: Medicare Other | Admitting: Occupational Therapy

## 2016-12-29 DIAGNOSIS — D638 Anemia in other chronic diseases classified elsewhere: Secondary | ICD-10-CM

## 2016-12-29 LAB — RENAL FUNCTION PANEL
ALBUMIN: 1.5 g/dL — AB (ref 3.5–5.0)
ANION GAP: 12 (ref 5–15)
BUN: 32 mg/dL — AB (ref 6–20)
CALCIUM: 8.1 mg/dL — AB (ref 8.9–10.3)
CO2: 27 mmol/L (ref 22–32)
Chloride: 91 mmol/L — ABNORMAL LOW (ref 101–111)
Creatinine, Ser: 7.29 mg/dL — ABNORMAL HIGH (ref 0.61–1.24)
GFR calc Af Amer: 8 mL/min — ABNORMAL LOW (ref >60)
GFR calc non Af Amer: 6 mL/min — ABNORMAL LOW (ref >60)
GLUCOSE: 466 mg/dL — AB (ref 65–99)
PHOSPHORUS: 3.9 mg/dL (ref 2.5–4.6)
Potassium: 4.8 mmol/L (ref 3.5–5.1)
SODIUM: 130 mmol/L — AB (ref 135–145)

## 2016-12-29 LAB — CBC
HCT: 28.5 % — ABNORMAL LOW (ref 39.0–52.0)
Hemoglobin: 9 g/dL — ABNORMAL LOW (ref 13.0–17.0)
MCH: 30.5 pg (ref 26.0–34.0)
MCHC: 31.6 g/dL (ref 30.0–36.0)
MCV: 96.6 fL (ref 78.0–100.0)
PLATELETS: 353 10*3/uL (ref 150–400)
RBC: 2.95 MIL/uL — ABNORMAL LOW (ref 4.22–5.81)
RDW: 14.5 % (ref 11.5–15.5)
WBC: 12.3 10*3/uL — ABNORMAL HIGH (ref 4.0–10.5)

## 2016-12-29 LAB — GLUCOSE, CAPILLARY
GLUCOSE-CAPILLARY: 478 mg/dL — AB (ref 65–99)
GLUCOSE-CAPILLARY: 273 mg/dL — AB (ref 65–99)
GLUCOSE-CAPILLARY: 313 mg/dL — AB (ref 65–99)
Glucose-Capillary: 444 mg/dL — ABNORMAL HIGH (ref 65–99)
Glucose-Capillary: 123 mg/dL — ABNORMAL HIGH (ref 65–99)

## 2016-12-29 MED ORDER — DARBEPOETIN ALFA 100 MCG/0.5ML IJ SOSY
100.0000 ug | PREFILLED_SYRINGE | INTRAMUSCULAR | Status: DC
  Administered 2016-12-29 – 2017-01-05 (×2): 100 ug via SUBCUTANEOUS
  Filled 2016-12-29 (×3): qty 0.5

## 2016-12-29 MED ORDER — DARBEPOETIN ALFA 100 MCG/0.5ML IJ SOSY
100.0000 ug | PREFILLED_SYRINGE | INTRAMUSCULAR | Status: DC
  Filled 2016-12-29: qty 0.5

## 2016-12-29 MED ORDER — INSULIN NPH (HUMAN) (ISOPHANE) 100 UNIT/ML ~~LOC~~ SUSP
15.0000 [IU] | Freq: Two times a day (BID) | SUBCUTANEOUS | Status: DC
Start: 2016-12-29 — End: 2016-12-29
  Filled 2016-12-29: qty 10

## 2016-12-29 MED ORDER — INSULIN ASPART 100 UNIT/ML ~~LOC~~ SOLN
15.0000 [IU] | Freq: Three times a day (TID) | SUBCUTANEOUS | Status: DC
  Administered 2016-12-30 – 2017-01-02 (×7): 15 [IU] via SUBCUTANEOUS

## 2016-12-29 MED ORDER — INSULIN NPH (HUMAN) (ISOPHANE) 100 UNIT/ML ~~LOC~~ SUSP
15.0000 [IU] | Freq: Three times a day (TID) | SUBCUTANEOUS | Status: DC
  Filled 2016-12-29: qty 10

## 2016-12-29 MED ORDER — INSULIN ASPART 100 UNIT/ML ~~LOC~~ SOLN
15.0000 [IU] | Freq: Once | SUBCUTANEOUS | Status: AC
  Administered 2016-12-29: 15 [IU] via SUBCUTANEOUS

## 2016-12-29 MED ORDER — INSULIN NPH (HUMAN) (ISOPHANE) 100 UNIT/ML ~~LOC~~ SUSP
15.0000 [IU] | Freq: Two times a day (BID) | SUBCUTANEOUS | Status: DC
  Administered 2016-12-29 (×2): 15 [IU] via SUBCUTANEOUS
  Filled 2016-12-29: qty 10

## 2016-12-29 NOTE — Progress Notes (Signed)
Physical Therapy Session Note  Patient Details  Name: [name] MRN: [id number] Date of Birth: 03/01/1941  Today's Date: 12/29/2016 PT Individual Time: 3017-2091 PT Individual Time Calculation (min): 60 min   Short Term Goals: Week 1:  PT Short Term Goal 1 (Week 1): Pt to be mod I with supine<>sitting. PT Short Term Goal 2 (Week 1): Sit<>stand to with min assist and LRAD PT Short Term Goal 3 (Week 1): Pt to ambulate 5 ft with LRAD PT Short Term Goal 4 (Week 1): Up/down 1 step with mod assist.   Skilled Therapeutic Interventions/Progress Updates:   Pt in w/c upon arrival and agreeable to therapy, no c/o pain. Pt self-propelled w/c to gym w/ supervision using bilateral UEs, 30-40' bouts 2/2 arm fatigue.   Functional LE strength this session in parallel bars w/ Min guard to close supervision: -multiple sit<>stands w/ UE support on bars -R partial knee bends, 2x5  -step forward and back w/ RLE and UE support on bars, was able to take 2 steps (w/ RLE) forward at one point to introduce gait, verbal cues to use UEs for support to decrease chance of weight-bearing through LLE -seated LAQs and knee marches bilaterally during rest breaks   Instructed pt on using UEs and RLE for w/c propulsion and he self-propelled w/c in 100-150' bouts at that point stating "my arms feel a lot better". Additionally performed car transfer for the first time, instructing pt in proper technique while maintaining WB precautions, performed transfer via lateral scoot w/ Min guard. Educated pt and wife on importance of staying upright during the day to increase tolerance to OOB activity, both verbalized understanding. Ended session in recliner and in care of wife, all needs met.   Therapy Documentation Precautions:  Precautions Precautions: Fall Precaution Comments: PD port Required Braces or Orthoses: Other Brace/Splint Other Brace/Splint: post op shoe Lt  Restrictions Weight Bearing Restrictions: Yes LLE  Weight Bearing: Non weight bearing LLE Partial Weight Bearing Percentage or Pounds: Darco shoe Vital Signs: Therapy Vitals Pulse Rate: 74 Resp: 18 BP: (!) 109/45 Patient Position (if appropriate): Sitting  See Function Navigator for Current Functional Status.   Therapy/Group: Individual Therapy  Gerrod Maule K Arnette 12/29/2016, 4:16 PM

## 2016-12-29 NOTE — Progress Notes (Signed)
[place] PHYSICAL MEDICINE & REHABILITATION     PROGRESS NOTE  Subjective/Complaints:  Pt seen laying in bed this AM.  Per nursing, CBGs elevated.  Pt states he had a tiring day of therapies yesterday, but wife reports he did well.  He slept well overnight.    ROS: +Constipation. Denies CP, SOB, N/V/D.  Objective: Vital Signs: Blood pressure 140/72, pulse 76, temperature 98.2 F (36.8 C), temperature source Axillary, resp. rate 18, height  (1.753 m), weight 90 kg (198 lb 6.6 oz), SpO2 100 %. No results found.  Recent Labs  12/27/16 0900 12/29/16 0601  WBC 13.1* 12.3*  HGB 10.6* 9.0*  HCT 32.8* 28.5*  PLT 357 353    Recent Labs  12/27/16 0900  NA 131*  K 3.4*  CL 96*  GLUCOSE 350*  BUN 26*  CREATININE 6.31*  CALCIUM 8.3*   CBG (last 3)   Recent Labs  12/28/16 1641 12/28/16 2057 12/29/16 0651  GLUCAP 120* 287* 478*    Wt Readings from Last 3 Encounters:  12/29/16 90 kg (198 lb 6.6 oz)  12/27/16 89.2 kg (196 lb 10.4 oz)  12/14/16 92.1 kg (203 lb)    Physical Exam:  BP 140/72 (BP Location: Right Arm)   Pulse 76   Temp 98.2 F (36.8 C) (Axillary)   Resp 18   Ht  (1.753 m)   Wt 90 kg (198 lb 6.6 oz)   SpO2 100%   BMI 29.30 kg/m  Constitutional: He appears well-developedand well-nourished. No distress.  HENT: Normocephalicand atraumatic.  Eyes: EOMare normal. No discharge. Cardiovascular: RRR. Murmurheard. Respiratory: Effort normal and breath sounds normal.  GI: Bowel sounds are normal. He exhibits no distension.  Musculoskeletal: He exhibits tenderness. He exhibits no edema.  Neurological: He is alertand oriented.  Motor: B/l UE 5/5 prox to distal.  LLE 4+/5 HF, KE, 4-/5 ADF/PF (pain inhibition).  RLE: 4+/5  HF, KE and ADF/PF.  Fair insight and awareness.  Skin: Skin is warmand dry. He is not diaphoretic.  Left TMA Vac in place.  Right groin wound packed  PD sites dressed Psychiatric: He has a normal mood and affect. His  behavior is normal. Judgmentand thought contentnormal.    Assessment/Plan: 1. Functional deficits secondary to left TMA which require 3+ hours per day of interdisciplinary therapy in a comprehensive inpatient rehab setting. Physiatrist is providing close team supervision and 24 hour management of active medical problems listed below. Physiatrist and rehab team continue to assess barriers to discharge/monitor patient progress toward functional and medical goals.  Function:  Bathing Bathing position   Position: Wheelchair/chair at sink  Bathing parts Body parts bathed by patient: Right arm, Left arm, Chest, Abdomen, Front perineal area, Right upper leg, Left upper leg Body parts bathed by helper: Buttocks, Left lower leg, Back  Bathing assist Assist Level: Touching or steadying assistance(Pt > 75%)      Upper Body Dressing/Undressing Upper body dressing   What is the patient wearing?: Pull over shirt/dress     Pull over shirt/dress - Perfomed by patient: Thread/unthread right sleeve, Thread/unthread left sleeve, Put head through opening Pull over shirt/dress - Perfomed by helper: Pull shirt over trunk        Upper body assist        Lower Body Dressing/Undressing Lower body dressing   What is the patient wearing?: Underwear, Pants, Non-skid slipper socks   Underwear - Performed by helper: Thread/unthread right underwear leg, Thread/unthread left underwear leg, Pull underwear up/down  Pants- Performed by helper: Fasten/unfasten pants, Pull pants up/down, Thread/unthread left pants leg, Thread/unthread right pants leg   Non-skid slipper socks- Performed by helper: Don/doff right sock (n/a left sock)                  Lower body assist Assist for lower body dressing: Touching or steadying assistance (Pt > 75%)      Toileting Toileting          Toileting assist     Transfers Chair/bed transfer   Chair/bed transfer method: Squat pivot Chair/bed transfer assist  level: Maximal assist (Pt 25 - 49%/lift and lower)       Locomotion Ambulation Ambulation activity did not occur: Safety/medical concerns         Wheelchair   Type: Manual Max wheelchair distance: 150 feet Assist Level: Touching or steadying assistance (Pt > 75%)  Cognition Comprehension Comprehension assist level: Follows complex conversation/direction with no assist  Expression Expression assist level: Expresses complex ideas: With no assist  Social Interaction Social Interaction assist level: Interacts appropriately with others - No medications needed.  Problem Solving Problem solving assist level: Solves complex problems: Recognizes & self-corrects  Memory Memory assist level: Complete Independence: No helper    Medical Problem List and Plan: 1. Functional and mobility deficits secondary to PAD requiring left TMA, right groin wound, multiple medical  Cont CIR 2. DVT Prophylaxis/Anticoagulation: Pharmaceutical: Heparin 3. Pain Management: continue oxycodone prn.  4. Mood: team to provide ego support. LCSW to follow for evaluation and support.  5. Neuropsych: This patient iscapable of making decisions on hisown behalf. 6. Skin/Wound Care: continue VAC left foot (until ?~9/21)  Monitor right groin wound continue dry dressing to groin 7. Fluids/Electrolytes/Nutrition: Monitor I/O. PD in evenings after therapy. 8. T2DM:   Used NPH at nights with novolog 20 units tid at home, lantus d/ced Novolog 15 TID started 9/20.   Monitor BS ac/hs.   Continue SSI for elevated BS.   Monitor with increased mobility, uncontrolled and labile at present with highs >470 9. Constipation: Increased colace to home regimen with sorbitol daily prn.  10. OSA: has been compliant with BIPAP at nights.  11. Leucocytosis:   Afebrile  WBCs 12.3 on 9/20  Cont to monitor 12. Anemia of chronic disease: Continue to monitor.   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 History of CHF: Monitor weight daily--fluid mangedwith PD. On ASA, lipitor, coreg and lasix bid.    Filed Weights   12/28/16 0600 12/28/16 1115 12/29/16 0539  Weight: 91.3 kg (201 lb 4.5 oz) 88.8 kg (195 lb 12.3 oz) 90 kg (198 lb 6.6 oz)   15. ESRD: On PD daily at nights. Tolerating it well at present   LOS (Days) 2 A FACE TO FACE EVALUATION WAS PERFORMED  Ankit Karis Juba 12/29/2016 8:06 AM

## 2016-12-29 NOTE — Procedures (Signed)
I was present at this dialysis session. I have reviewed the session itself and made appropriate changes.   Hb down, restart ESA, check Fe stores with next lab draw.    Timing of PD doing better, no changes to PD Rx at this time.   2.5L 2.5% dwell.  Dry day.     BMD labs at goal  [facility] Weights   12/28/16 0600 12/28/16 1115 12/29/16 0539  Weight: 91.3 kg (201 lb 4.5 oz) 88.8 kg (195 lb 12.3 oz) 90 kg (198 lb 6.6 oz)     Recent Labs Lab 12/29/16 0601  NA 130*  K 4.8  CL 91*  CO2 27  GLUCOSE 466*  BUN 32*  CREATININE 7.29*  CALCIUM 8.1*  PHOS 3.9     Recent Labs Lab 12/23/16 0652 12/27/16 0900 12/29/16 0601  WBC 10.5 13.1* 12.3*  HGB 10.3* 10.6* 9.0*  HCT 31.6* 32.8* 28.5*  MCV 94.9 95.6 96.6  PLT 308 357 353    Scheduled Meds: . aspirin EC  81 mg Oral Daily  . atorvastatin  80 mg Oral q1800  . calcium acetate  667 mg Oral TID WC  . carvedilol  12.5 mg Oral BID WC  . clopidogrel  75 mg Oral Daily  . darbepoetin (ARANESP) injection - DIALYSIS  100 mcg Intravenous Q Thu-HD  . docusate sodium  200 mg Oral BID  . feeding supplement (NEPRO CARB STEADY)  237 mL Oral TID WC  . furosemide  40 mg Oral BID  . gabapentin  100 mg Oral QHS  . gentamicin cream  1 application Topical Daily  . heparin  5,000 Units Subcutaneous Q8H  . Influenza vac split quadrivalent PF  0.5 mL Intramuscular Tomorrow-1000  . insulin aspart  0-9 Units Subcutaneous TID WC  . insulin aspart  15 Units Subcutaneous TID WC  . insulin NPH Human  15 Units Subcutaneous BID AC & HS  . isosorbide mononitrate  30 mg Oral Daily  . losartan  25 mg Oral QHS  . mouth rinse  15 mL Mouth Rinse BID  . multivitamin  1 tablet Oral QHS  . pantoprazole  40 mg Oral Daily  . potassium chloride  20 mEq Oral BID  . [START ON 01/08/2017] Vitamin D (Ergocalciferol)  50,000 Units Oral Q30 days   Continuous Infusions: . dialysis solution 1.5% low-MG/low-CA    . dialysis solution 2.5% low-MG/low-CA     PRN  Meds:.acetaminophen, alum & mag hydroxide-simeth, bisacodyl, diphenhydrAMINE, guaiFENesin-dextromethorphan, dianeal solution for CAPD/CCPD with heparin, dianeal solution for CAPD/CCPD with heparin, magic mouthwash, methocarbamol, nitroGLYCERIN, ondansetron **OR** ondansetron (ZOFRAN) IV, oxyCODONE, oxyCODONE, phenol, polyethylene glycol, prochlorperazine **OR** prochlorperazine **OR** prochlorperazine, sodium phosphate, sorbitol, traMADol, traZODone, triamcinolone   Sabra Heck  MD 12/29/2016, 11:26 AM    Start ESA 100 qThurs, Check Fe

## 2016-12-29 NOTE — Progress Notes (Signed)
Occupational Therapy Session Note  Patient Details  Name: [name] MRN: [id number] Date of Birth: 1941/02/11  Today's Date: 12/29/2016 OT Individual Time: 1302-1401 OT Individual Time Calculation (min): 59 min    Short Term Goals: Week 1:  OT Short Term Goal 1 (Week 1): Pt will transfer to toilet with min A  OT Short Term Goal 2 (Week 1): Pt will perform sit to stand with min A in prep for clothing management while maintaining weight bearing precautions OT Short Term Goal 3 (Week 1): Pt wil perform bed mobilty without bed rails with supervision/ extra time OT Short Term Goal 4 (Week 1): Pt will navigate w/c around room to obtain clothing for ADL with min A  OT Short Term Goal 5 (Week 1): Pt will perform 3/3 grooming tasks with setup  Skilled Therapeutic Interventions/Progress Updates:    Pt completed bathing and dressing at the sink during session.  Supervision for all bathing and dressing of UB with min assist for sit to stand to complete LB.  Therapist assisted with pushing down underpants and shorts and then pulling up underpants and shorts during bathing tasks.  Pt demonstrates decreased ability to maintain NWBing status on the LLE with sit to stand transition and when washing peri area.  Mod instructional cueing and mod assist for standing balance while attempting washing.  Worked on sit to stand from wheelchair and hopping forward and backwards 3-4 small steps with RW and mod assist as well.  Pt with limited endurance in his UEs for hopping at this time.  Pt left in wheelchair with spouse present at end of session.    Therapy Documentation Precautions:  Precautions Precautions: Fall Precaution Comments: PD port Required Braces or Orthoses: Other Brace/Splint Other Brace/Splint: post op shoe Lt  Restrictions Weight Bearing Restrictions: Yes LLE Weight Bearing: Non weight bearing LLE Partial Weight Bearing Percentage or Pounds: Darco shoe  Pain: Pain Assessment Pain  Assessment: No/denies pain ADL: See Function Navigator for Current Functional Status.   Therapy/Group: Individual Therapy  Madolyn Ackroyd,Jayquan OTR/L 12/29/2016, 3:44 PM

## 2016-12-29 NOTE — Progress Notes (Signed)
Physical Therapy Session Note  Patient Details  Name: DECKARD STUBER MRN: [id number] Date of Birth: 05-19-1940  Today's Date: 12/29/2016 PT Individual Time: 1050-1200 PT Individual Time Calculation (min): 70 min   Short Term Goals: Week 1:  PT Short Term Goal 1 (Week 1): Pt to be mod I with supine<>sitting. PT Short Term Goal 2 (Week 1): Sit<>stand to with min assist and LRAD PT Short Term Goal 3 (Week 1): Pt to ambulate 5 ft with LRAD PT Short Term Goal 4 (Week 1): Up/down 1 step with mod assist.   Skilled Therapeutic Interventions/Progress Updates: Pt presented in bed agreeable to therapy. PTA provided maxA donning shorts due to wound vac in place. Performed supine to sit supervision with use of features. Lateral scoot to w/c min guard with verbal cues for NWB status. Propelled w/c 261f with intermittent breaks due to fatigue. Performed sit to stand x 2 in parallel bars with standing tolerance 1 min, 2 min respectively. Pt required minA stand to sit to maintaining NWB status. Performed lateral scoot to mat and performed sit to stand with RW x 4 with modA. On last trial pt with LOB with pt placing wt on LLE and requiring modA for recovery to sitting. Performed lateral scoot back to w/c min guard and performed RLE strengthening from w/c including: :LAQ, hip flexion, huip abd/add, ankle pumps with knee ext all performed wit 3# wt x 15 reps. Performed HS pulls level 2 resistance band x 15. Pt transported back to room for energy conservation and left with call bell within reach and needs met.      Therapy Documentation Precautions:  Precautions Precautions: Fall Precaution Comments: PD port Required Braces or Orthoses: Other Brace/Splint Other Brace/Splint: post op shoe Lt  Restrictions Weight Bearing Restrictions: Yes LLE Weight Bearing: Non weight bearing LLE Partial Weight Bearing Percentage or Pounds: Darco shoe General:   Vital Signs:    See Function Navigator for Current  Functional Status.   Therapy/Group: Individual Therapy  Illona Bulman  Khalil Szczepanik, PTA  12/29/2016, 12:55 PM

## 2016-12-30 ENCOUNTER — Inpatient Hospital Stay (HOSPITAL_COMMUNITY): Payer: Medicare Other | Admitting: Occupational Therapy

## 2016-12-30 ENCOUNTER — Inpatient Hospital Stay (HOSPITAL_COMMUNITY): Payer: Medicare Other

## 2016-12-30 ENCOUNTER — Inpatient Hospital Stay (HOSPITAL_COMMUNITY): Payer: Medicare Other | Admitting: Physical Therapy

## 2016-12-30 LAB — GLUCOSE, CAPILLARY
GLUCOSE-CAPILLARY: 412 mg/dL — AB (ref 65–99)
GLUCOSE-CAPILLARY: 201 mg/dL — AB (ref 65–99)
Glucose-Capillary: 509 mg/dL (ref 65–99)
Glucose-Capillary: 514 mg/dL (ref 65–99)
Glucose-Capillary: 169 mg/dL — ABNORMAL HIGH (ref 65–99)

## 2016-12-30 MED ORDER — INSULIN NPH (HUMAN) (ISOPHANE) 100 UNIT/ML ~~LOC~~ SUSP
25.0000 [IU] | Freq: Every day | SUBCUTANEOUS | Status: DC
  Administered 2016-12-30: 25 [IU] via SUBCUTANEOUS
  Filled 2016-12-30: qty 10

## 2016-12-30 MED ORDER — SENNOSIDES-DOCUSATE SODIUM 8.6-50 MG PO TABS
2.0000 | ORAL_TABLET | Freq: Two times a day (BID) | ORAL | Status: DC
  Administered 2016-12-30 – 2017-01-05 (×13): 2 via ORAL
  Filled 2016-12-30 (×15): qty 2

## 2016-12-30 MED ORDER — GENTAMICIN SULFATE 0.1 % EX CREA
1.0000 "application " | TOPICAL_CREAM | Freq: Every day | CUTANEOUS | Status: DC
  Administered 2017-01-01 – 2017-01-05 (×4): 1 via TOPICAL
  Filled 2016-12-30: qty 15

## 2016-12-30 MED ORDER — OXYCODONE HCL 5 MG PO TABS
5.0000 mg | ORAL_TABLET | ORAL | Status: DC | PRN
  Administered 2016-12-30 – 2017-01-01 (×5): 5 mg via ORAL
  Filled 2016-12-30 (×5): qty 1

## 2016-12-30 MED ORDER — INSULIN NPH (HUMAN) (ISOPHANE) 100 UNIT/ML ~~LOC~~ SUSP: 20.0000 [IU] | Freq: Every day | SUBCUTANEOUS | Status: DC

## 2016-12-30 MED ORDER — SORBITOL 70 % SOLN: 30.0000 mL | Freq: Two times a day (BID) | Status: DC | PRN

## 2016-12-30 MED ORDER — HEPARIN 1000 UNIT/ML FOR PERITONEAL DIALYSIS: 500.0000 [IU] | INTRAMUSCULAR | Status: DC | PRN

## 2016-12-30 NOTE — Patient Care Conference (Signed)
Inpatient RehabilitationTeam Conference and Plan of Care Update Date: 12/28/2016   Time: 2:25 PM    Patient Name: [name]      Medical Record Number: [id number]  Date of Birth: September 10, 1940 Sex: Male         Room/Bed: 4M12C/4M12C-01 Payor Info: Payor: MEDICARE / Plan: MEDICARE PART A AND B / Product Type: *No Product type* /    Admitting Diagnosis: L TMA  Admit Date/Time:  12/27/2016  4:38 PM Admission Comments: No comment available   Primary Diagnosis:  Debility Principal Problem: Debility  Patient Active Problem List   Diagnosis Date Noted  . Anemia of chronic disease   . Labile blood glucose   . 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 Diabetic peripheral neuropathy (HCC)   . Debility 12/27/2016  . History of transmetatarsal amputation of left foot (HCC) 12/27/2016  . Constipation   . Chronic obstructive pulmonary disease (HCC)   . Nausea   . ESRD on dialysis (HCC)   . Leukocytosis   . Tachypnea   . FUO (fever of unknown origin)   . Encephalopathy 12/23/2016  . Sore throat 12/23/2016  . Gangrene of left foot (HCC) 11/26/2016  . PAD (peripheral artery disease) (HCC) 11/26/2016  . Sjogren's syndrome (HCC) 09/26/2015  . Uncontrolled type 2 diabetes mellitus with hyperglycemia, with long-term current use of insulin (HCC) 09/26/2015  . Hyperosmolar non-ketotic state in patient with type 2 diabetes mellitus (HCC) 09/25/2015  . Ileus (HCC) 09/22/2015  . Acute respiratory failure with hypoxia (HCC) 09/22/2015  . Acute gouty arthritis 09/22/2015  . CAD (coronary artery disease) of artery bypass graft 09/22/2015  . Sepsis (HCC) 09/21/2015  . Elevated troponin level 09/21/2015  . ESRD on peritoneal dialysis (HCC) 09/21/2015  . CVA (cerebral infarction) 09/21/2015    Expected Discharge Date: Expected Discharge Date: 01/13/17  Team Members Present: Physician leading conference: Dr. Maryla Morrow Social Worker Present: Amada Jupiter,  LCSW Nurse Present: Chrissie Noa, RN PT Present: Woodfin Ganja, PT OT Present: Perrin Maltese, OT PPS Coordinator present : Tora Duck, RN, CRRN     Current Status/Progress Goal Weekly Team Focus  Medical   Functional and mobility deficits  secondary to PAD requiring left TMA, right groin wound, multiple medical  Improve mobility, transfers, safety, DM, leukocytosis  See above   Bowel/Bladder   continent of bowel, oliguric, LBM 12/27/16  remain continent  continue to monitor   Swallow/Nutrition/ Hydration             ADL's             Mobility   mod-max A mobility 2/2 NWB LLE, 2-3 steps gait in parallel bars  supervision-Mod I   adherence to precautions, initiate gait, use of AD for transfers   Communication             Safety/Cognition/ Behavioral Observations            Pain   no c/o pain during shift, has tylenol, tramadol, oxycodone, percocet & robaxin prn, has scheduled neurontin  pain scale <4  assess & treat as needed   Skin   wound vac to LLE amputee site, MASD to bil groin & penis, rt groin wound, rt foot DM ulcer bet 4th & 5th digit, steri strips to lt chest AICD site, peritoneal dialysis cath, LUE fistula unaccessed  no signs of infection to existing wounds, no new wounds  treat existing wounds & assess q shift  Rehab Goals Patient on target to meet rehab goals: Yes *See Care Plan and progress notes for long and short-term goals.     Barriers to Discharge  Current Status/Progress Possible Resolutions Date Resolved   Physician    Medical stability;Decreased caregiver support;Wound Care;Lack of/limited family support;Other (comments)  VAC  See above  Therapies, d/c VAC when appropriate, optimize DM meds, follow labs      Nursing  Medication compliance;Wound Care;Nutrition means  Diabetes control            PT                    OT                  SLP                SW                Discharge Planning/Teaching Needs:  Home with wife and family  to provide 24/7 assistance.  Teaching to be planned closer to d/c.   Team Discussion:  New eval;  Goals being assessed today. Pt overall max assist with mod - max transfers.  Wound VAC.    Revisions to Treatment Plan:  None    Continued Need for Acute Rehabilitation Level of Care: The patient requires daily medical management by a physician with specialized training in physical medicine and rehabilitation for the following conditions: Daily direction of a multidisciplinary physical rehabilitation program to ensure safe treatment while eliciting the highest outcome that is of practical value to the patient.: Yes Daily medical management of patient stability for increased activity during participation in an intensive rehabilitation regime.: Yes Daily analysis of laboratory values and/or radiology reports with any subsequent need for medication adjustment of medical intervention for : Blood pressure problems;Wound care problems;Other  Kostas Marrow 12/30/2016, 2:24 PM

## 2016-12-30 NOTE — Progress Notes (Signed)
Patients blood sugar was 509 mg/dl this morning. Results given to on call provider. No orders given at this time.

## 2016-12-30 NOTE — Procedures (Signed)
I was present at this dialysis session. I have reviewed the session itself and made appropriate changes.   Did well on PD yesterday.  ~1.5L UF.  Na 128, advised to keep fliuds total in 24h < 1.5L.    Cont PD using 2.5% and 2.5L with dry day.  Check labs in AM, Fe Panel. CBC  Filed Weights   12/29/16 0539 12/29/16 1720 12/30/16 0500  Weight: 90 kg (198 lb 6.6 oz) 89.7 kg (197 lb 12 oz) 89.3 kg (196 lb 13.9 oz)     Recent Labs Lab 12/29/16 0601 12/30/16 0816  NA 130* 128*  K 4.8 4.6  CL 91* 91*  CO2 27 27  GLUCOSE 466* 524*  BUN 32* 37*  CREATININE 7.29* 7.40*  CALCIUM 8.1* 8.3*  PHOS 3.9  --      Recent Labs Lab 12/27/16 0900 12/29/16 0601  WBC 13.1* 12.3*  HGB 10.6* 9.0*  HCT 32.8* 28.5*  MCV 95.6 96.6  PLT 357 353    Scheduled Meds: . aspirin EC  81 mg Oral Daily  . atorvastatin  80 mg Oral q1800  . calcium acetate  667 mg Oral TID WC  . carvedilol  12.5 mg Oral BID WC  . clopidogrel  75 mg Oral Daily  . darbepoetin (ARANESP) injection - DIALYSIS  100 mcg Subcutaneous Q Thu-1800  . feeding supplement (NEPRO CARB STEADY)  237 mL Oral TID WC  . furosemide  40 mg Oral BID  . gabapentin  100 mg Oral QHS  . gentamicin cream  1 application Topical Daily  . heparin  5,000 Units Subcutaneous Q8H  . insulin aspart  0-9 Units Subcutaneous TID WC  . insulin aspart  15 Units Subcutaneous TID WC  . insulin NPH Human  25 Units Subcutaneous QHS  . isosorbide mononitrate  30 mg Oral Daily  . losartan  25 mg Oral QHS  . mouth rinse  15 mL Mouth Rinse BID  . multivitamin  1 tablet Oral QHS  . pantoprazole  40 mg Oral Daily  . potassium chloride  20 mEq Oral BID  . senna-docusate  2 tablet Oral BID  . [START ON 01/08/2017] Vitamin D (Ergocalciferol)  50,000 Units Oral Q30 days   Continuous Infusions: . dialysis solution 1.5% low-MG/low-CA    . dialysis solution 2.5% low-MG/low-CA     PRN Meds:.acetaminophen, alum & mag hydroxide-simeth, bisacodyl, diphenhydrAMINE,  guaiFENesin-dextromethorphan, dianeal solution for CAPD/CCPD with heparin, dianeal solution for CAPD/CCPD with heparin, magic mouthwash, methocarbamol, nitroGLYCERIN, ondansetron **OR** ondansetron (ZOFRAN) IV, oxyCODONE, phenol, polyethylene glycol, prochlorperazine **OR** prochlorperazine **OR** prochlorperazine, sodium phosphate, sorbitol, traMADol, traZODone, triamcinolone   Sabra Heck  MD 12/30/2016, 10:10 AM

## 2016-12-30 NOTE — Progress Notes (Signed)
Social Work Patient ID: [name], male   DOB: 07-Aug-1940, 76 y.o.   MRN: [id number]   Have reviewed team conference with pt and wife.  They are aware and agreeable with targeted d/c date of 10/5 and supervision/ mod ind goals overall.  No concerns at this time.  Becky Dupree, LCSW to assume coverage of this case on Monday.  Anushri Casalino, LCSW

## 2016-12-30 NOTE — Progress Notes (Signed)
Occupational Therapy Session Note  Patient Details  Name: [name] MRN: [id number] Date of Birth: 24-May-1940  Today's Date: 12/30/2016 OT Individual Time: 1130-1200 OT Individual Time Calculation (min): 30 min    Short Term Goals: Week 1:  OT Short Term Goal 1 (Week 1): Pt will transfer to toilet with min A  OT Short Term Goal 2 (Week 1): Pt will perform sit to stand with min A in prep for clothing management while maintaining weight bearing precautions OT Short Term Goal 3 (Week 1): Pt wil perform bed mobilty without bed rails with supervision/ extra time OT Short Term Goal 4 (Week 1): Pt will navigate w/c around room to obtain clothing for ADL with min A  OT Short Term Goal 5 (Week 1): Pt will perform 3/3 grooming tasks with setup  Skilled Therapeutic Interventions/Progress Updates:    1:1 Focus on transfer training. PT with much improved squat pivot transfers and sit to stands today compared to evaluation with this clinician. Pt able to perform w/c squat pivot with contact guard with A for w/c setup. Pt still required mod VC for setup of w/c in prep for transfer inlcuding positioning and removal of LE rest. Sit to stands from EOM with steadying A mulitple times. PT able to maintain NWB status with min A (with therapist foot underneath to monitor weight bearing). Pt able to transition into weight shift to then practicing stand pivot transfers with RW with min A to maintain weigh bearing and steadying A, Performed stand pivot tranfers 4 times. Also perform w/c mobility around obstacles with min A. Returned to room with call bell.  Wife present for session and pleased with progress.   Therapy Documentation Precautions:  Precautions Precautions: Fall Precaution Comments: PD port Required Braces or Orthoses: Other Brace/Splint Other Brace/Splint: post op shoe Lt  Restrictions Weight Bearing Restrictions: Yes LLE Weight Bearing: Non weight bearing LLE Partial Weight Bearing  Percentage or Pounds: Darco shoe Pain: Pain Assessment Pain Assessment: Faces Faces Pain Scale: Hurts a little bit Pain Type: Surgical pain Pain Location: Leg Pain Orientation: Left Pain Onset: On-going Pain Intervention(s): Repositioned, rest  ADL: ADL ADL Comments: see functional navigator Other Treatments:    See Function Navigator for Current Functional Status.   Therapy/Group: Individual Therapy  Roney Mans [facility] 12/30/2016, 3:38 PM

## 2016-12-30 NOTE — Progress Notes (Signed)
Physical Therapy Session Note  Patient Details  Name: [name] MRN: [id number] Date of Birth: 01/18/1941  Today's Date: 12/30/2016 PT Individual Time: 1530-1640 PT Individual Time Calculation (min): 70 min   Short Term Goals: Week 1:  PT Short Term Goal 1 (Week 1): Pt to be mod I with supine<>sitting. PT Short Term Goal 2 (Week 1): Sit<>stand to with min assist and LRAD PT Short Term Goal 3 (Week 1): Pt to ambulate 5 ft with LRAD PT Short Term Goal 4 (Week 1): Up/down 1 step with mod assist.   Skilled Therapeutic Interventions/Progress Updates:   Pt in recliner upon arrival and agreeable to therapy, no c/o pain. Worked on LE strengthening and functional mobility this session. Pt self-propelled w/c to/from therapy gym w/ supervision to increase endurance and independence w/ mobility.   LE strengthening exercises in parallel bars:  -sit<>stands x4 w/ supervision and manual assistance to prevent WB through LLE -partial knee bends w/ RLE 2x10  -seated LLE LAQs 3# 3x10, knee marches 3# 3x10, and hs curls w/ resistance band 2x10  -practiced taking steps w/ RLE w/ emphasis on weightbearing through UEs on parallel bars to increase UE strengthening for gait   Worked on gait w/ RW, pt ambulated 5' x2 w/ Min A from therapist for maintaining precautions and RW management. Pt easily fatigued w/ this task and unable to lift RLE from ground for step towards end of gait bouts. Educated pt on energy conservation especially w/ a NWB LE during functional mobility. Both pt and wife present and appreciative of education.  Returned to room and practiced transfers using RW, w/c to/from recliner. Pt required Min A for transfer however feels unsteady w/ RW for transfer at this time, will continue to encourage and facilitate for increased independence. Ended session in recliner and in care of wife, all needs met.   Therapy Documentation Precautions:  Precautions Precautions: Fall Precaution Comments:  PD port Required Braces or Orthoses: Other Brace/Splint Other Brace/Splint: post op shoe Lt  Restrictions Weight Bearing Restrictions: Yes LLE Weight Bearing: Non weight bearing LLE Partial Weight Bearing Percentage or Pounds: Darco shoe  See Function Navigator for Current Functional Status.   Therapy/Group: Individual Therapy  Claire Dolores K Arnette 12/30/2016, 7:04 PM

## 2016-12-30 NOTE — Progress Notes (Signed)
Occupational Therapy Session Note  Patient Details  Name: [name] MRN: [id number] Date of Birth: 11/25/1940  Today's Date: 12/30/2016 OT Individual Time: 1335-1400 OT Individual Time Calculation (min): 25 min    Short Term Goals: Week 1:  OT Short Term Goal 1 (Week 1): Pt will transfer to toilet with min A  OT Short Term Goal 2 (Week 1): Pt will perform sit to stand with min A in prep for clothing management while maintaining weight bearing precautions OT Short Term Goal 3 (Week 1): Pt wil perform bed mobilty without bed rails with supervision/ extra time OT Short Term Goal 4 (Week 1): Pt will navigate w/c around room to obtain clothing for ADL with min A  OT Short Term Goal 5 (Week 1): Pt will perform 3/3 grooming tasks with setup  Skilled Therapeutic Interventions/Progress Updates:    1;1. Pt with no c/o pain this session however feeling fatigued and "lowsy" as pt was sick at lunchtime and BP low. OT checked with RN to confirm and retook vitals. O2 and HR WNL, BP 100/41 before EOB activity and 107/48 after EOB activity. Pt transition supine>EOB with supervision to complete 4 rounds of 60 passes of ball (chest, bounce and overhead) with pt holding arms out at 80 degree isometric flexion to improve BUE strength and endurance required for ADLs. Pt transfer EOB>recliner with squat pivot transfer with MIN A and VC for safety awareness/WB precautions. Exited session with pt seated in recliner with call light in reach and wife present.   Therapy Documentation Precautions:  Precautions Precautions: Fall Precaution Comments: PD port Required Braces or Orthoses: Other Brace/Splint Other Brace/Splint: post op shoe Lt  Restrictions Weight Bearing Restrictions: Yes LLE Weight Bearing: Non weight bearing LLE Partial Weight Bearing Percentage or Pounds: Darco shoe  See Function Navigator for Current Functional Status.   Therapy/Group: Individual Therapy  [name] 12/30/2016, 4:30 PM

## 2016-12-30 NOTE — Progress Notes (Signed)
[facility] PHYSICAL MEDICINE & REHABILITATION     PROGRESS NOTE  Subjective/Complaints:   Pt states no BM x 5d, no abd pain, no supp or enema tried   Noted some blood in wound vac tubing yesterday  ROS: +Constipation. Denies CP, SOB, N/V/D.  Objective: Vital Signs: Blood pressure (!) 140/58, pulse 67, temperature 98.7 F (37.1 C), temperature source Oral, resp. rate 18, height  (1.753 m), weight 89.3 kg (196 lb 13.9 oz), SpO2 98 %. No results found.  Recent Labs  12/27/16 0900 12/29/16 0601  WBC 13.1* 12.3*  HGB 10.6* 9.0*  HCT 32.8* 28.5*  PLT 357 353    Recent Labs  12/27/16 0900 12/29/16 0601  NA 131* 130*  K 3.4* 4.8  CL 96* 91*  GLUCOSE 350* 466*  BUN 26* 32*  CREATININE 6.31* 7.29*  CALCIUM 8.3* 8.1*   CBG (last 3)   Recent Labs  12/29/16 1653 12/29/16 2119 12/30/16 0629  GLUCAP 123* 313* 509*    Wt Readings from Last 3 Encounters:  12/30/16 89.3 kg (196 lb 13.9 oz)  12/27/16 89.2 kg (196 lb 10.4 oz)  12/14/16 92.1 kg (203 lb)    Physical Exam:  BP (!) 140/58 (BP Location: Right Arm)   Pulse 67   Temp 98.7 F (37.1 C) (Oral)   Resp 18   Ht  (1.753 m)   Wt 89.3 kg (196 lb 13.9 oz)   SpO2 98%   BMI 29.07 kg/m  Constitutional: He appears well-developedand well-nourished. No distress.  HENT: Normocephalicand atraumatic.  Eyes: EOMare normal. No discharge. Cardiovascular: RRR. Murmurheard. Respiratory: Effort normal and breath sounds normal.  GI: Bowel sounds are normal. He exhibits no distension.  Musculoskeletal: He exhibits tenderness. He exhibits no edema.  Neurological: He is alertand oriented.  Motor: B/l UE 5/5 prox to distal.  LLE 4+/5 HF, KE, 4-/5 ADF/PF (pain inhibition).  RLE: 4+/5  HF, KE and ADF/PF.  Fair insight and awareness.  Skin: Skin is warmand dry. He is not diaphoretic.  Left TMA Vac in place.  Right groin wound packed  PD sites dressed Psychiatric: He has a normal mood and affect. His behavior  is normal. Judgmentand thought contentnormal.    Assessment/Plan: 1. Functional deficits secondary to left TMA which require 3+ hours per day of interdisciplinary therapy in a comprehensive inpatient rehab setting. Physiatrist is providing close team supervision and 24 hour management of active medical problems listed below. Physiatrist and rehab team continue to assess barriers to discharge/monitor patient progress toward functional and medical goals.  Function:  Bathing Bathing position   Position: Wheelchair/chair at sink  Bathing parts Body parts bathed by patient: Right arm, Left arm, Chest, Abdomen, Front perineal area, Right upper leg, Left upper leg Body parts bathed by helper: Buttocks, Right lower leg, Left lower leg, Back  Bathing assist Assist Level: Touching or steadying assistance(Pt > 75%)      Upper Body Dressing/Undressing Upper body dressing   What is the patient wearing?: Pull over shirt/dress     Pull over shirt/dress - Perfomed by patient: Thread/unthread right sleeve, Thread/unthread left sleeve, Put head through opening, Pull shirt over trunk Pull over shirt/dress - Perfomed by helper: Pull shirt over trunk        Upper body assist        Lower Body Dressing/Undressing Lower body dressing   What is the patient wearing?: Non-skid slipper socks   Underwear - Performed by helper: Thread/unthread right underwear leg, Thread/unthread left underwear  leg, Pull underwear up/down   Pants- Performed by helper: Fasten/unfasten pants, Pull pants up/down, Thread/unthread left pants leg, Thread/unthread right pants leg Non-skid slipper socks- Performed by patient: Don/doff right sock (left sock NA) Non-skid slipper socks- Performed by helper: Don/doff right sock (n/a left sock)                  Lower body assist Assist for lower body dressing: Touching or steadying assistance (Pt > 75%)      Toileting Toileting Toileting activity did not occur: N/A         Toileting assist     Transfers Chair/bed transfer   Chair/bed transfer method: Squat pivot Chair/bed transfer assist level: Moderate assist (Pt 50 - 74%/lift or lower) Chair/bed transfer assistive device: Armrests     Locomotion Ambulation Ambulation activity did not occur: Safety/medical concerns   Max distance: 3' Assist level: Touching or steadying assistance (Pt > 75%)   Wheelchair   Type: Manual Max wheelchair distance: 150 feet Assist Level: Supervision or verbal cues  Cognition Comprehension Comprehension assist level: Follows complex conversation/direction with no assist  Expression Expression assist level: Expresses complex ideas: With no assist  Social Interaction Social Interaction assist level: Interacts appropriately with others - No medications needed.  Problem Solving Problem solving assist level: Solves complex problems: Recognizes & self-corrects  Memory Memory assist level: Complete Independence: No helper    Medical Problem List and Plan: 1. Functional and mobility deficits secondary to PAD requiring left TMA, right groin wound, multiple medical  Cont CIR PT< OT 2. DVT Prophylaxis/Anticoagulation: Pharmaceutical: Heparin 3. Pain Management: continue oxycodone prn.  4. Mood: team to provide ego support. LCSW to follow for evaluation and support.  5. Neuropsych: This patient iscapable of making decisions on hisown behalf. 6. Skin/Wound Care: continue VAC left foot (until ?~9/21)  Monitor right groin wound continue dry dressing to groin 7. Fluids/Electrolytes/Nutrition: Monitor I/O. PD in evenings after therapy. 8. T2DM:   Used NPH at nights with novolog 20 units tid at home, lantus d/ced Novolog 15 TID started 9/20.   Monitor BS ac/hs.   Continue SSI for elevated BS.   Am CBGs elevated home NPH dose is 70U qhs, will d/c am NPH and increase pm NPH to 25U9. Constipation: Increased colace to home regimen with sorbitol daily prn.  10. OSA:  has been compliant with BIPAP at nights.  11. Leucocytosis:   Afebrile  WBCs 12.3 on 9/20  Cont to monitor 12. Anemia of chronic disease: Continue to monitor.   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 History of CHF: Monitor weight daily--fluid mangedwith PD. On ASA, lipitor, coreg and lasix bid.    Filed Weights   12/29/16 0539 12/29/16 1720 12/30/16 0500  Weight: 90 kg (198 lb 6.6 oz) 89.7 kg (197 lb 12 oz) 89.3 kg (196 lb 13.9 oz)   15. ESRD: On PD daily at nights. Tolerating it well at present 16.  Constipation- senna S 2 po BID, dulcolax this pm  LOS (Days) 3 A FACE TO FACE EVALUATION WAS PERFORMED  [place] Route E 12/30/2016 7:14 AM

## 2016-12-30 NOTE — Progress Notes (Signed)
PT Home CPAP.  RT will continue to monitor

## 2016-12-30 NOTE — Progress Notes (Signed)
PT has home CPAP.  PT wife stated they did not need anything.  RT will continue to monitor.

## 2016-12-30 NOTE — Progress Notes (Signed)
Social Work  Social Work Assessment and Plan  Patient Details  Name: [name] MRN: [id number] Date of Birth: 09-16-1940  Today's Date: 12/30/2016  Problem List:  Patient Active Problem List   Diagnosis Date Noted  . Chronic congestive heart failure (HCC)   . Anemia of chronic disease   . Labile blood glucose   . 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 Diabetic peripheral neuropathy (HCC)   . Debility 12/27/2016  . History of transmetatarsal amputation of left foot (HCC) 12/27/2016  . Constipation   . Chronic obstructive pulmonary disease (HCC)   . Nausea   . ESRD on dialysis (HCC)   . Leukocytosis   . Tachypnea   . FUO (fever of unknown origin)   . Encephalopathy 12/23/2016  . Sore throat 12/23/2016  . Gangrene of left foot (HCC) 11/26/2016  . PAD (peripheral artery disease) (HCC) 11/26/2016  . Sjogren's syndrome (HCC) 09/26/2015  . Uncontrolled type 2 diabetes mellitus with hyperglycemia, with long-term current use of insulin (HCC) 09/26/2015  . Hyperosmolar non-ketotic state in patient with type 2 diabetes mellitus (HCC) 09/25/2015  . Ileus (HCC) 09/22/2015  . Acute respiratory failure with hypoxia (HCC) 09/22/2015  . Acute gouty arthritis 09/22/2015  . CAD (coronary artery disease) of artery bypass graft 09/22/2015  . Sepsis (HCC) 09/21/2015  . Elevated troponin level 09/21/2015  . ESRD on peritoneal dialysis (HCC) 09/21/2015  . CVA (cerebral infarction) 09/21/2015   Past Medical History:  Past Medical History:  Diagnosis Date  . AICD (automatic cardioverter/defibrillator) present   . CHF (congestive heart failure) (HCC)   . COPD (chronic obstructive pulmonary disease) (HCC)   . Coronary artery disease    CABG 2011  . Diabetes mellitus without complication (HCC)   . ESRD on peritoneal dialysis [facility])    Started PD Dec 2016 in [place]. Nephrology is in [place].   [name] History of peritonitis    PD cath related  peritonitis in April 2017  . Hypertension   . Peripheral vascular disease (HCC)   . Sjogren's syndrome (HCC)    Per pt's wife, was diagnosed in 2016 during w/u for cause of renal failure prior to starting dialysis.  He had a rash on his chest apparently prompting this w/u.  Never had renal bx.  He was referred to a rheum MD in [place] and per the wife he was treated with MTX and other medications but had side effects to "all of it" and isn't taking anything for this as of Jun 2017.   . Stroke [facility])    Past Surgical History:  Past Surgical History:  Procedure Laterality Date  . ABDOMINAL AORTOGRAM W/LOWER EXTREMITY N/A 11/29/2016   Procedure: ABDOMINAL AORTOGRAM W/LOWER EXTREMITY;  Surgeon: Nada Libman, MD;  Location: [facility] INVASIVE CV LAB;  Service: Cardiovascular;  Laterality: N/A;  . AMPUTATION Left 11/29/2016   Procedure: LEFT THIRD TOE AMPUTATION;  Surgeon: Nada Libman, MD;  Location: [facility] OR;  Service: Vascular;  Laterality: Left;  . AMPUTATION Left 12/23/2016   Procedure: Left Transmetatarsal Amputation;  Surgeon: Nadara Mustard, MD;  Location: [facility] OR;  Service: Orthopedics;  Laterality: Left;  . ANGIOPLASTY  12/15/2016   Procedure: BALLOON ANGIOPLASTY;  Surgeon: Maeola Harman, MD;  Location: [facility] OR;  Service: Vascular;;  . cardiac catherization with stent placement  2017  . CORONARY ARTERY BYPASS GRAFT  2011  . FALSE ANEURYSM REPAIR Right 12/15/2016   Procedure: REPAIR FALSE ANEURYSM;  Surgeon:  Maeola Harman, MD;  Location: [facility] OR;  Service: Vascular;  Laterality: Right;  . IRRIGATION AND DEBRIDEMENT FOOT Left 12/15/2016   Procedure: IRRIGATION AND DEBRIDEMENT FOOT;  Surgeon: Maeola Harman, MD;  Location: [facility] OR;  Service: Vascular;  Laterality: Left;  . LOWER EXTREMITY ANGIOGRAM Left 12/15/2016   Procedure: LOWER EXTREMITY ANGIOGRAM; PEDAL ACCESS;  Surgeon: Maeola Harman, MD;  Location: [facility] OR;  Service: Vascular;  Laterality: Left;  .  PERIPHERAL VASCULAR BALLOON ANGIOPLASTY  11/29/2016   Procedure: PERIPHERAL VASCULAR BALLOON ANGIOPLASTY;  Surgeon: Nada Libman, MD;  Location: [facility] INVASIVE CV LAB;  Service: Cardiovascular;;  LT Peroneal AT attempted unsuccessful   Social History:  reports that he has quit smoking. He has never used smokeless tobacco. He reports that he does not drink alcohol or use drugs.  Family / Support Systems Marital Status: Married Patient Roles: Spouse, Parent Spouse/Significant Other: wife, Andruw Battie @ (C) [phone] Children: adult son and daughter Anticipated Caregiver: wife Ability/Limitations of Caregiver: Wife does not work and can assist at home Caregiver Availability: 24/7 Family Dynamics: Wife here daily and very supportive.  She denies any concerns about providing assist at home.  Social History Preferred language: English Religion: [employer] Cultural Background: NA Read: Yes Write: Yes Employment Status: Retired Fish farm manager Issues: None Guardian/Conservator: None - per MD, pt is capable of making decisions on his own behalf   Abuse/Neglect Physical Abuse: Denies Verbal Abuse: Denies Sexual Abuse: Denies Exploitation of patient/patient's resources: Denies Self-Neglect: Denies  Emotional Status Pt's affect, behavior adn adjustment status: Pt pleasant, soft-spoken and completes assessment interview without difficulty.  He denies any emotional distress but will monitor and refer for neuropsychology as indicated. Recent Psychosocial Issues: None Pyschiatric History: None Substance Abuse History: None  Patient / Family Perceptions, Expectations & Goals Pt/Family understanding of illness & functional limitations: Pt and wife with good understanding of medical issues/ course that resulted in need for amputation.  Good understanding of restrictions and limitations. Premorbid pt/family roles/activities: pt was independent overall but using rollator and  transport w/c when needed. Anticipated changes in roles/activities/participation: dependent on gains - wife may need to provide more physical assistance Pt/family expectations/goals: "I just want to be able to get around as best as I can."  [employer]: None Premorbid Home Care/DME Agencies: Other (Comment) ([facility]) Transportation available at discharge: yes Resource referrals recommended: Support group (specify)  Discharge Planning Living Arrangements: Spouse/significant other Support Systems: Spouse/significant other, Other relatives, Friends/neighbors Type of Residence: Private residence Insurance Resources: [employer] Financial Resources: Social Security Financial Screen Referred: No Living Expenses: Own Money Management: Patient Does the patient have any problems obtaining your medications?: No Home Management: pt and iwfe Patient/Family Preliminary Plans: Pt to return home with wife providing 24/7 support Social Work Anticipated Follow Up Needs: HH/OP Expected length of stay: 14-17 days  Clinical Impression Very pleasant gentleman here following transmet amp. Wife at bedside daily and very supportive.  Wife able to provide 24/7 assistance and both with good understanding of limitations and anticipated assist needs.  Will follow for support and d/c planning needs.  Parish Augustine 12/30/2016, 12:45 PM

## 2016-12-30 NOTE — Progress Notes (Signed)
Occupational Therapy Session Note  Patient Details  Name: [name] MRN: [id number] Date of Birth: 11/09/1940  Today's Date: 12/30/2016 OT Individual Time: 1000-1059 OT Individual Time Calculation (min): 59 min    Short Term Goals: Week 1:  OT Short Term Goal 1 (Week 1): Pt will transfer to toilet with min A  OT Short Term Goal 2 (Week 1): Pt will perform sit to stand with min A in prep for clothing management while maintaining weight bearing precautions OT Short Term Goal 3 (Week 1): Pt wil perform bed mobilty without bed rails with supervision/ extra time OT Short Term Goal 4 (Week 1): Pt will navigate w/c around room to obtain clothing for ADL with min A  OT Short Term Goal 5 (Week 1): Pt will perform 3/3 grooming tasks with setup  Skilled Therapeutic Interventions/Progress Updates:    Pt completed bathing and dressing during session sit to stand at the sink.  Min instructional cueing for correct technique for sit to stand in order to keep RLE NWBing.  Pt still setting LLE on the floor at times with sit to stand and during standing when completing LB selfcare.  Supervision for all UB selfcare with min assist for LB selfcare sit to stand.  Worked on stand pivot transfers to the [facility] over the toilet with min assist using the RW for support.  Pt with increased difficulty taking more than 2-3 hops and maintaining NWBing status secondary to decreased BUE strength and endurance.  Will continue to work on in therapy.  Finished session with pt in wheelchair with call button and phone in reach.    Therapy Documentation Precautions:  Precautions Precautions: Fall Precaution Comments: PD port Required Braces or Orthoses: Other Brace/Splint Other Brace/Splint: post op shoe Lt  Restrictions Weight Bearing Restrictions: Yes LLE Weight Bearing: Non weight bearing LLE Partial Weight Bearing Percentage or Pounds: Darco shoe  Pain: Pain Assessment Pain Assessment: Faces Faces Pain Scale:  Hurts a little bit Pain Type: Surgical pain Pain Location: Leg Pain Orientation: Left Pain Onset: On-going Pain Intervention(s): Repositioned ADL: See Function Navigator for Current Functional Status.   Therapy/Group: Individual Therapy  Toba Claudio,Kollen  OTR/L 12/30/2016, 12:54 PM

## 2016-12-31 ENCOUNTER — Inpatient Hospital Stay (HOSPITAL_COMMUNITY): Payer: Medicare Other | Admitting: Occupational Therapy

## 2016-12-31 ENCOUNTER — Inpatient Hospital Stay (HOSPITAL_COMMUNITY): Payer: Medicare Other

## 2016-12-31 ENCOUNTER — Inpatient Hospital Stay (HOSPITAL_COMMUNITY): Payer: Medicare Other | Admitting: Physical Therapy

## 2016-12-31 LAB — BASIC METABOLIC PANEL
Anion gap: 9 (ref 5–15)
BUN: 39 mg/dL — AB (ref 6–20)
CALCIUM: 8.1 mg/dL — AB (ref 8.9–10.3)
CO2: 28 mmol/L (ref 22–32)
CREATININE: 7.04 mg/dL — AB (ref 0.61–1.24)
Chloride: 91 mmol/L — ABNORMAL LOW (ref 101–111)
GFR calc Af Amer: 8 mL/min — ABNORMAL LOW (ref >60)
GFR calc non Af Amer: 7 mL/min — ABNORMAL LOW (ref >60)
GLUCOSE: 315 mg/dL — AB (ref 65–99)
POTASSIUM: 4.4 mmol/L (ref 3.5–5.1)
Sodium: 128 mmol/L — ABNORMAL LOW (ref 135–145)

## 2016-12-31 LAB — FERRITIN: Ferritin: 2284 ng/mL — ABNORMAL HIGH (ref 24–336)

## 2016-12-31 LAB — CBC
HEMATOCRIT: 28.5 % — AB (ref 39.0–52.0)
HEMOGLOBIN: 9.2 g/dL — AB (ref 13.0–17.0)
MCH: 30.7 pg (ref 26.0–34.0)
MCHC: 32.3 g/dL (ref 30.0–36.0)
MCV: 95 fL (ref 78.0–100.0)
Platelets: 418 10*3/uL — ABNORMAL HIGH (ref 150–400)
RBC: 3 MIL/uL — AB (ref 4.22–5.81)
RDW: 14.4 % (ref 11.5–15.5)
WBC: 12.2 10*3/uL — AB (ref 4.0–10.5)

## 2016-12-31 LAB — GLUCOSE, CAPILLARY
GLUCOSE-CAPILLARY: 116 mg/dL — AB (ref 65–99)
GLUCOSE-CAPILLARY: 77 mg/dL (ref 65–99)
GLUCOSE-CAPILLARY: 90 mg/dL (ref 65–99)
GLUCOSE-CAPILLARY: 138 mg/dL — AB (ref 65–99)
Glucose-Capillary: 288 mg/dL — ABNORMAL HIGH (ref 65–99)
Glucose-Capillary: 68 mg/dL (ref 65–99)

## 2016-12-31 LAB — IRON AND TIBC
Iron: 64 ug/dL (ref 45–182)
Saturation Ratios: 45 % — ABNORMAL HIGH (ref 17.9–39.5)
TIBC: 143 ug/dL — ABNORMAL LOW (ref 250–450)
UIBC: 79 ug/dL

## 2016-12-31 MED ORDER — INSULIN NPH (HUMAN) (ISOPHANE) 100 UNIT/ML ~~LOC~~ SUSP
40.0000 [IU] | Freq: Every day | SUBCUTANEOUS | Status: DC
  Administered 2016-12-31 – 2017-01-05 (×6): 40 [IU] via SUBCUTANEOUS
  Filled 2016-12-31: qty 10

## 2016-12-31 NOTE — Progress Notes (Signed)
Hypoglycemic Event  CBG: 77  Treatment: meal   Symptoms: none  Follow-up CBG: Time:1714 CBG Result:68  Possible Reasons for Event: medication  Comments/MD notified:dr. Barth Kirks, Levonne Hubert

## 2016-12-31 NOTE — Progress Notes (Signed)
[name] PHYSICAL MEDICINE & REHABILITATION     PROGRESS NOTE  Subjective/Complaints:   Had bm yesterday. Sore and tired from therapies but happy with progress. Sleep is reasonable  ROS: pt denies nausea, vomiting, diarrhea, cough, shortness of breath or chest pain   Objective: Vital Signs: Blood pressure (!) 128/51, pulse 75, temperature 97.6 F (36.4 C), temperature source Oral, resp. rate 18, height  (1.753 m), weight 90.7 kg (199 lb 15.3 oz), SpO2 90 %. No results found.  Recent Labs  12/29/16 0601 12/31/16 0602  WBC 12.3* 12.2*  HGB 9.0* 9.2*  HCT 28.5* 28.5*  PLT 353 418*    Recent Labs  12/30/16 0816 12/31/16 0602  NA 128* 128*  K 4.6 4.4  CL 91* 91*  GLUCOSE 524* 315*  BUN 37* 39*  CREATININE 7.40* 7.04*  CALCIUM 8.3* 8.1*   CBG (last 3)   Recent Labs  12/30/16 1646 12/30/16 2121 12/31/16 0645  GLUCAP 169* 201* 288*    Wt Readings from Last 3 Encounters:  12/31/16 90.7 kg (199 lb 15.3 oz)  12/27/16 89.2 kg (196 lb 10.4 oz)  12/14/16 92.1 kg (203 lb)    Physical Exam:  BP (!) 128/51 (BP Location: Right Arm)   Pulse 75   Temp 97.6 F (36.4 C) (Oral)   Resp 18   Ht  (1.753 m)   Wt 90.7 kg (199 lb 15.3 oz)   SpO2 90%   BMI 29.53 kg/m  Constitutional: [name] appears well-developedand well-nourished. No distress.  HENT: Normocephalicand atraumatic.  Eyes: EOMare normal. No discharge. Cardiovascular: RRR with murmur Respiratory: CTA Bilaterally without wheezes or rales. Normal effort  GI: Bowel sounds are normal. [name] exhibits no distension.  Musculoskeletal: [name] exhibits tenderness. [name] exhibits no edema.  Neurological: [name] is alertand oriented.  Motor: B/l UE 5/5 prox to distal.  LLE 4+/5 HF, KE, 4-/5 ADF/PF (pain inhibition).  RLE: 4+/5  HF, KE and ADF/PF.  Fair insight and awareness.  Skin: Skin is warmand dry. [name] is not diaphoretic.  Left TMA Vac in place with no drainage in cannister.  Right groin wound packed  PD sites  dressed Psychiatric: [name] has a normal mood and affect. [name] behavior is normal. Judgmentand thought contentnormal.    Assessment/Plan: 1. Functional deficits secondary to left TMA which require 3+ hours per day of interdisciplinary therapy in a comprehensive inpatient rehab setting. Physiatrist is providing close team supervision and 24 hour management of active medical problems listed below. Physiatrist and rehab team continue to assess barriers to discharge/monitor patient progress toward functional and medical goals.  Function:  Bathing Bathing position   Position: Wheelchair/chair at sink  Bathing parts Body parts bathed by patient: Right arm, Left arm, Chest, Abdomen, Front perineal area, Right upper leg, Left upper leg, Right lower leg, Buttocks Body parts bathed by helper: Buttocks, Right lower leg, Left lower leg, Back  Bathing assist Assist Level: Touching or steadying assistance(Pt > 75%)      Upper Body Dressing/Undressing Upper body dressing   What is the patient wearing?: Pull over shirt/dress     Pull over shirt/dress - Perfomed by patient: Thread/unthread right sleeve, Thread/unthread left sleeve, Put head through opening, Pull shirt over trunk Pull over shirt/dress - Perfomed by helper: Pull shirt over trunk        Upper body assist Assist Level: Supervision or verbal cues      Lower Body Dressing/Undressing Lower body dressing   What is the patient wearing?: Underwear, Pants,  Non-skid slipper socks, Shoes Underwear - Performed by patient: Thread/unthread right underwear leg, Thread/unthread left underwear leg, Pull underwear up/down Underwear - Performed by helper: Thread/unthread right underwear leg, Thread/unthread left underwear leg, Pull underwear up/down Pants- Performed by patient: Thread/unthread right pants leg, Thread/unthread left pants leg, Pull pants up/down, Fasten/unfasten pants Pants- Performed by helper: Fasten/unfasten pants, Pull pants  up/down, Thread/unthread left pants leg, Thread/unthread right pants leg Non-skid slipper socks- Performed by patient: Don/doff right sock Non-skid slipper socks- Performed by helper: Don/doff right sock (n/a left sock)     Shoes - Performed by patient: Don/doff right shoe            Lower body assist Assist for lower body dressing: Touching or steadying assistance (Pt > 75%)      Toileting Toileting Toileting activity did not occur: N/A        Toileting assist     Transfers Chair/bed transfer   Chair/bed transfer method: Squat pivot Chair/bed transfer assist level: Touching or steadying assistance (Pt > 75%) Chair/bed transfer assistive device: Armrests     Locomotion Ambulation Ambulation activity did not occur: Safety/medical concerns   Max distance: 5' Assist level: Touching or steadying assistance (Pt > 75%)   Wheelchair   Type: Manual Max wheelchair distance: 150 feet Assist Level: Supervision or verbal cues  Cognition Comprehension Comprehension assist level: Follows complex conversation/direction with extra time/assistive device  Expression Expression assist level: Expresses complex ideas: With extra time/assistive device  Social Interaction Social Interaction assist level: Interacts appropriately with others - No medications needed.  Problem Solving Problem solving assist level: Solves complex problems: Recognizes & self-corrects  Memory Memory assist level: Complete Independence: No helper    Medical Problem List and Plan: 1. Functional and mobility deficits secondary to PAD requiring left TMA, right groin wound, multiple medical  Cont CIR PT< OT 2. DVT Prophylaxis/Anticoagulation: Pharmaceutical: Heparin 3. Pain Management: continue oxycodone prn.  4. Mood: team to provide ego support. LCSW to follow for evaluation and support.  5. Neuropsych: This patient iscapable of making decisions on hisown behalf. 6. Skin/Wound Care: dc vac today  Monitor  right groin wound continue dry dressing to groin 7. Fluids/Electrolytes/Nutrition: Monitor I/O. PD in evenings after therapy. 8. T2DM:   Used NPH at nights with novolog 20 units tid at home, lantus d/ced Novolog 15 TID started 9/20.   Monitor BS ac/hs.   Continue SSI for elevated BS.   Am CBGs elevated home NPH dose is 70U qhs,   -increase HS NPH to 40u tonight 9. Constipation: Increased colace to home regimen with sorbitol daily prn.  10. OSA: has been compliant with BIPAP at nights.  11. Leucocytosis:   Afebrile  WBCs 12.3 on 9/20  Cont to monitor 12. Anemia of chronic disease: Continue to monitor.   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 History of CHF: Monitor weight daily--fluid mangedwith PD. On ASA, lipitor, coreg and lasix bid.    Filed Weights   12/30/16 0500 12/31/16 0500 12/31/16 0600  Weight: 89.3 kg (196 lb 13.9 oz) 90.7 kg (199 lb 15.3 oz) 90.7 kg (199 lb 15.3 oz)   15. ESRD: On PD daily at nights. Tolerating it well at present 16.  Constipation- senna S 2 po BID, dulcolax this pm  LOS (Days) 4 A FACE TO FACE EVALUATION WAS PERFORMED  Karam Dunson T 12/31/2016 8:41 AM

## 2016-12-31 NOTE — Progress Notes (Signed)
Occupational Therapy Session Note  Patient Details  Name: [name] MRN: [id number] Date of Birth: Jul 28, 1940  Today's Date: 12/31/2016 OT Individual Time: 1130-1200 OT Individual Time Calculation (min): 30 min    Short Term Goals: Week 1:  OT Short Term Goal 1 (Week 1): Pt will transfer to toilet with min A  OT Short Term Goal 2 (Week 1): Pt will perform sit to stand with min A in prep for clothing management while maintaining weight bearing precautions OT Short Term Goal 3 (Week 1): Pt wil perform bed mobilty without bed rails with supervision/ extra time OT Short Term Goal 4 (Week 1): Pt will navigate w/c around room to obtain clothing for ADL with min A  OT Short Term Goal 5 (Week 1): Pt will perform 3/3 grooming tasks with setup  Skilled Therapeutic Interventions/Progress Updates:    1;1. Pt reporting pain in residual limb, however decreasing since RN administered medication. Pt changes pants MIN A balance for sit to stand and VC for posture and WB status. OT applies coban to shoe as velcor not long enough to wrap around residual limb. Pt propels w/c to gym wit supervision for general conditioning.  Pt squat pivot transfer with CGA and VC for safety awareness. Pt sit to stand 4x with VC for posture and matches playing cards onto board infront of pt with MIN A for balance while standing. Exited session with pt seated in recliner with all needs met and wife present.   Therapy Documentation Precautions:  Precautions Precautions: Fall Precaution Comments: PD port Required Braces or Orthoses: Other Brace/Splint Other Brace/Splint: post op shoe Lt  Restrictions Weight Bearing Restrictions: Yes LLE Weight Bearing: Non weight bearing LLE Partial Weight Bearing Percentage or Pounds: Darco shoe  See Function Navigator for Current Functional Status.   Therapy/Group: Individual Therapy  Tonny Branch 12/31/2016, 12:13 PM

## 2016-12-31 NOTE — Progress Notes (Signed)
  [facility] Progress Note   Assessment/ Plan:    Dialysis Orders: Center: [facility]  on CCPD . 6 exchanges/day one mid-day exchange (occurs aroudn 9am).  2.5 liters of 2.5% per CCPD and 2 liters of 2.5% for last fill and mid day drain.  1. 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  S/p L TMT 12/23/16 with Dr. Lajoyce Corners.  2. ESRD- CCPD, using 2.5% dextrose; To facilitate CIR doing: CCPD 2.5L x 5 exchanges, , Dry Day.  At DC can resume prev PD Rx. 3. Hypertension/volume- BP stable 4. Anemia (ABLA)- TSAT 45% and Ferritin 2284, cont Aranesp 100 qThurs 5. Constipation: miralax as needed 6. Hypokalemia: supplementing  Subjective:    Doing well in CIR No c/o from pt UF around 0.5L Stable weights   Objective:   BP (!) 128/51 (BP Location: Right Arm)   Pulse 75   Temp 97.6 F (36.4 C) (Oral)   Resp 18   Ht  (1.753 m)   Wt 90.7 kg (199 lb 15.3 oz)   SpO2 90%   BMI 29.53 kg/m   Physical Exam: GEN NAD HEENT EOMI, PULM clear bilaterally CV RRR no m/r/g ABD distended, NABS, PD cath c/d/i EXT L LE s/p TMT with woundvac in place, trace LLE edema, no RLE edema.   Labs: BMET  Recent Labs Lab 12/27/16 0900 12/29/16 0601 12/30/16 0816 12/31/16 0602  NA 131* 130* 128* 128*  K 3.4* 4.8 4.6 4.4  CL 96* 91* 91* 91*  CO2 GLUCOSE 350* 466* 524* 315*  BUN 26* 32* 37* 39*  CREATININE 6.31* 7.29* 7.40* 7.04*  CALCIUM 8.3* 8.1* 8.3* 8.1*  PHOS 3.0 3.9  --   --    CBC  Recent Labs Lab 12/27/16 0900 12/29/16 0601 12/31/16 0602  WBC 13.1* 12.3* 12.2*  HGB 10.6* 9.0* 9.2*  HCT 32.8* 28.5* 28.5*  MCV 95.6 96.6 95.0  PLT 357 353 418*    @ Medications:    . aspirin EC  81 mg Oral Daily  . atorvastatin  80 mg Oral q1800  . calcium acetate  667 mg Oral TID WC  . carvedilol  12.5 mg Oral BID WC  . clopidogrel  75 mg Oral Daily  . darbepoetin (ARANESP) injection - DIALYSIS   100 mcg Subcutaneous Q Thu-1800  . feeding supplement (NEPRO CARB STEADY)  237 mL Oral TID WC  . furosemide  40 mg Oral BID  . gabapentin  100 mg Oral QHS  . gentamicin cream  1 application Topical Daily  . heparin  5,000 Units Subcutaneous Q8H  . insulin aspart  0-9 Units Subcutaneous TID WC  . insulin aspart  15 Units Subcutaneous TID WC  . insulin NPH Human  40 Units Subcutaneous QHS  . isosorbide mononitrate  30 mg Oral Daily  . losartan  25 mg Oral QHS  . mouth rinse  15 mL Mouth Rinse BID  . multivitamin  1 tablet Oral QHS  . pantoprazole  40 mg Oral Daily  . potassium chloride  20 mEq Oral BID  . senna-docusate  2 tablet Oral BID  . [START ON 01/08/2017] Vitamin D (Ergocalciferol)  50,000 Units Oral Q30 days     [facility] 12/31/2016, 9:46 AM

## 2016-12-31 NOTE — Progress Notes (Signed)
Hypoglycemic Event  CBG: 68  Treatment: orange juice  Symptoms: none  Follow-up CBG: Time:1750 CBG Result:90  Possible Reasons for Event: medication  Comments/MD notified:Dr. Barth Kirks, Levonne Hubert

## 2016-12-31 NOTE — Progress Notes (Signed)
Occupational Therapy Session Note  Patient Details  Name: [name] MRN: [id number] Date of Birth: 12-11-1940  Today's Date: 12/31/2016 OT Individual Time: 0918-1000 OT Individual Time Calculation (min): 42 min    Short Term Goals: Week 1:  OT Short Term Goal 1 (Week 1): Pt will transfer to toilet with min A  OT Short Term Goal 2 (Week 1): Pt will perform sit to stand with min A in prep for clothing management while maintaining weight bearing precautions OT Short Term Goal 3 (Week 1): Pt wil perform bed mobilty without bed rails with supervision/ extra time OT Short Term Goal 4 (Week 1): Pt will navigate w/c around room to obtain clothing for ADL with min A  OT Short Term Goal 5 (Week 1): Pt will perform 3/3 grooming tasks with setup  Skilled Therapeutic Interventions/Progress Updates:    Pt completed bathing and dressing sit to stand at the sink.  Min guard assist for all sit to stand transitions for washing peri area and for pulling pants over hips.  He still demonstrates decreased ability to maintain NWBing over the LLE but instead seems to place the foot down and apply TDWBing instead.  Supervision for completion of all grooming tasks in sitting as well. Pt left in wheelchair at end of session with call button and phone in reach.    Therapy Documentation Precautions:  Precautions Precautions: Fall Precaution Comments: PD port Required Braces or Orthoses: Other Brace/Splint Other Brace/Splint: post op shoe Lt  Restrictions Weight Bearing Restrictions: Yes LLE Weight Bearing: Non weight bearing LLE Partial Weight Bearing Percentage or Pounds: Darco shoe  Pain: Pain Assessment Pain Assessment: 0-10 Pain Score: 3  Pain Type: Surgical pain Pain Location: Foot Pain Orientation: Left Pain Descriptors / Indicators: Discomfort Pain Frequency: Occasional Pain Onset: On-going Pain Intervention(s): Repositioned ADL: See Function Navigator for Current Functional  Status.   Therapy/Group: Individual Therapy  Arleatha Philipps,[place] OTR/L 12/31/2016, 10:00 AM

## 2016-12-31 NOTE — Progress Notes (Signed)
Physical Therapy Session Note  Patient Details  Name: [name] MRN: [id number] Date of Birth: 08/21/1940  Today's Date: 12/31/2016 PT Individual Time: 6389-3734 PT Individual Time Calculation (min): 15 min  and Today's Date: 12/31/2016 PT Missed Time: 45 Minutes Missed Time Reason: Pain  Short Term Goals: Week 1:  PT Short Term Goal 1 (Week 1): Pt to be mod I with supine<>sitting. PT Short Term Goal 2 (Week 1): Sit<>stand to with min assist and LRAD PT Short Term Goal 3 (Week 1): Pt to ambulate 5 ft with LRAD PT Short Term Goal 4 (Week 1): Up/down 1 step with mod assist.   Skilled Therapeutic Interventions/Progress Updates:   Pt supine upon arrival and hesitant to participate in therapy today due to BP issues and pain. Returned 1 hour later and RN agreeable, pt's BP 122/49 while supine and no c/o signs or symptoms of hypotension. Pain 8/10 at this time and pt wincing w/ every movement in bed, requesting to not get OOB or perform PT this session. Educated pt and wife on residual limb desensitization and continuing to desensitize limb outside of therapy now that wound vac is off. Both verbalized understanding and in agreement, provided handout of education. Pt remained in supine and in care of family, all needs met.   Therapy Documentation Precautions:  Precautions Precautions: Fall Precaution Comments: PD port Required Braces or Orthoses: Other Brace/Splint Other Brace/Splint: post op shoe Lt  Restrictions Weight Bearing Restrictions: Yes LLE Weight Bearing: Non weight bearing LLE Partial Weight Bearing Percentage or Pounds: Darco shoe General: PT Amount of Missed Time (min): 45 Minutes PT Missed Treatment Reason: Pain Vital Signs: Therapy Vitals Temp: 97.6 F (36.4 C) Temp Source: Oral Pulse Rate: 70 BP: (!) 102/46 Patient Position (if appropriate): Sitting Oxygen Therapy SpO2: 100 % O2 Device: Not Delivered Pain: Pain Assessment Pain Assessment: 0-10 Pain  Score: 8  Pain Type: Surgical pain Pain Location: Foot Pain Orientation: Left Pain Descriptors / Indicators: Aching Pain Frequency: Occasional Pain Onset: On-going Pain Intervention(s): Medication (See eMAR)  See Function Navigator for Current Functional Status.   Therapy/Group: Individual Therapy  Earsel Shouse K Arnette 12/31/2016, 4:31 PM

## 2016-12-31 NOTE — Progress Notes (Signed)
1033 12/31/16 nursing Wound vac discontinued per order. Patient tolerated. Dressing placed per order. Wound vac machine taken by portable equipments.

## 2016-12-31 NOTE — Progress Notes (Signed)
RT came to check on patient. Patient already on his home unit CPAP. Patient tolerating well.

## 2016-12-31 NOTE — Progress Notes (Signed)
12/31/16 1442 nursing RN was called to room re: patient not feeling well and c/o dizziness. Vital signs taken initially was 89/38 72; RN encouraged patient to drink water and vital signs was taken after 102/46 72 100% RA. MD notified of incident. New orders noted. RN followed up on patient claims he feels much better but his left foot is still hurting since vac was taken out this morning. PRN meds given per order.

## 2016-12-31 NOTE — Progress Notes (Signed)
Pt resting in bed quietly. Easily aroused. Wife at bedside. Pt able to make needs known. Left foot with c/d/i drsg and of continuous woundvac therapy noted. Drainage unchanged within canister. drsg to both groin and right 5 th toe changed per order. Ulceration to toe and surgical incision that is well approximated and dry to r groin. Minimal shadowing noted on drsg. CPAP on at hs.  LUA AVF +/+. Peritoneal drsg is noted to LLQ and is c/d/i. Peritoneal dialysis is noted and properly functioning. Able to make needs known. AICD noted to L chest with steri strips and #5 steri strips is noted to L flank per assessment. MASD to perineal folds with light scale this shift. antfungal powder applied. callbell within reach. Safety maintained. Will continue to monitor.

## 2016-12-31 NOTE — Progress Notes (Signed)
Occupational Therapy Session Note  Patient Details  Name: [name] MRN: [id number] Date of Birth: 1940-07-01  Today's Date: 12/31/2016 OT Individual Time: 1300-1406 OT Individual Time Calculation (min): 66 min    Short Term Goals: Week 1:  OT Short Term Goal 1 (Week 1): Pt will transfer to toilet with min A  OT Short Term Goal 2 (Week 1): Pt will perform sit to stand with min A in prep for clothing management while maintaining weight bearing precautions OT Short Term Goal 3 (Week 1): Pt wil perform bed mobilty without bed rails with supervision/ extra time OT Short Term Goal 4 (Week 1): Pt will navigate w/c around room to obtain clothing for ADL with min A  OT Short Term Goal 5 (Week 1): Pt will perform 3/3 grooming tasks with setup  Skilled Therapeutic Interventions/Progress Updates:    Pt completed transfer from bedside recliner to the wheelchair with min assist using the RW.  Min instructional cueing for technique to keep weight off of the LLE.  He then propelled down to the day room with increased time and 3 rest breaks from the wheelchair level.  Had pt complete 3 sets on the UE ergonometer.  He was only able to tolerate 2 minutes and then required rest breaks when resistance was placed on level 10 and RPMs were maintained above 20.  Last set resistance was set on level 1 and he still only tolerated 1 min 10 seconds before needing rest break.  Next had him complete 2 sets of 10 repetitions for wheelchair pushups.  He was able to complete 5-6 with rest in-between to finish.  Returned to room at end of session.  Therapist applied more velcro to his shoe to assist with holding it on as strapping is now too small with gauze and ace wrapping on the foot.    Therapy Documentation Precautions:  Precautions Precautions: Fall Precaution Comments: PD port Required Braces or Orthoses: Other Brace/Splint Other Brace/Splint: post op shoe Lt  Restrictions Weight Bearing Restrictions:  Yes LLE Weight Bearing: Non weight bearing LLE Partial Weight Bearing Percentage or Pounds: Darco shoe  Pain: Pain Assessment Pain Assessment: 0-10 Pain Score: 8  Pain Type: Surgical pain Pain Location: Foot Pain Orientation: Left Pain Descriptors / Indicators: Aching Pain Intervention(s): Repositioned ADL: See Function Navigator for Current Functional Status.   Therapy/Group: Individual Therapy  Trichelle Lehan,[place] OTR/L 12/31/2016, 3:14 PM

## 2017-01-01 ENCOUNTER — Inpatient Hospital Stay (HOSPITAL_COMMUNITY): Payer: Medicare Other | Admitting: Occupational Therapy

## 2017-01-01 DIAGNOSIS — E871 Hypo-osmolality and hyponatremia: Secondary | ICD-10-CM

## 2017-01-01 DIAGNOSIS — I952 Hypotension due to drugs: Secondary | ICD-10-CM

## 2017-01-01 LAB — BASIC METABOLIC PANEL
Anion gap: 9 (ref 5–15)
BUN: 45 mg/dL — AB (ref 6–20)
CALCIUM: 8 mg/dL — AB (ref 8.9–10.3)
CO2: 28 mmol/L (ref 22–32)
Chloride: 90 mmol/L — ABNORMAL LOW (ref 101–111)
Creatinine, Ser: 7 mg/dL — ABNORMAL HIGH (ref 0.61–1.24)
GFR calc Af Amer: 8 mL/min — ABNORMAL LOW (ref >60)
GFR, EST NON AFRICAN AMERICAN: 7 mL/min — AB (ref >60)
GLUCOSE: 258 mg/dL — AB (ref 65–99)
POTASSIUM: 4.8 mmol/L (ref 3.5–5.1)
Sodium: 127 mmol/L — ABNORMAL LOW (ref 135–145)

## 2017-01-01 LAB — GLUCOSE, CAPILLARY
GLUCOSE-CAPILLARY: 333 mg/dL — AB (ref 65–99)
Glucose-Capillary: 269 mg/dL — ABNORMAL HIGH (ref 65–99)
Glucose-Capillary: 194 mg/dL — ABNORMAL HIGH (ref 65–99)
Glucose-Capillary: 93 mg/dL (ref 65–99)

## 2017-01-01 NOTE — Progress Notes (Signed)
Pt restring in bed quietly. Easily aroused. c/o pain in left foot only when moved and describes as a throbbing pain. Pt and family educated that pain management will be the main focus this shift and to inform if it becomes uncontrolled. Pain management administered this am prior to dressing changes. Left foot: area is closely approimated with some dark colored scabbing noted and 11 sutures that are all intact. Area is cleansed and dry drsg is applied with ace wrap and elevated. r 5th toe ulceration with a brown and yellow center is noted. cleansed and aquacel is applied per order with dry foam drsg. Right groin is well approximated and changed with dry drsg this am. Pt xc/o of the thr groin area becoming "a little sore with subsequent drsg changes." safety maintained. callbell within reach. Wife at bedside. Will continue to monitor.

## 2017-01-01 NOTE — Progress Notes (Signed)
Occupational Therapy Session Note  Patient Details  Name: [name] MRN: [id number] Date of Birth: 10-30-1940  Today's Date: 01/01/2017 OT Individual Time: 1435-1530 OT Individual Time Calculation (min): 55 min    Short Term Goals: Week 1:  OT Short Term Goal 1 (Week 1): Pt will transfer to toilet with min A  OT Short Term Goal 2 (Week 1): Pt will perform sit to stand with min A in prep for clothing management while maintaining weight bearing precautions OT Short Term Goal 3 (Week 1): Pt wil perform bed mobilty without bed rails with supervision/ extra time OT Short Term Goal 4 (Week 1): Pt will navigate w/c around room to obtain clothing for ADL with min A  OT Short Term Goal 5 (Week 1): Pt will perform 3/3 grooming tasks with setup  Skilled Therapeutic Interventions/Progress Updates:   Pt completed bathing and dressing during session.  Pt with more fatigue and sleepiness secondary to pain meds this session compared to yesterday.  He needed max assist for stand pivot transfer to the wheelchair from EOB this session as well as for sit to stand transitions at the sink. Posterior lean present in standing as well as right knee weakness.  Increased difficulty also noted keeping his weight off of the LLE when standing as well.  He was able to complete UB selfcare with supervision but needed min assist for threading LB clothing with min instructional cueing to begin with dressing the LLE first as well.  He was unable to maintain standing and complete peri washing or pulling up pants without max assist for balance.  Pt left in wheelchair at end of session per request with call button and phone in reach.      Therapy Documentation Precautions:  Precautions Precautions: Fall Precaution Comments: PD port Required Braces or Orthoses: Other Brace/Splint Other Brace/Splint: post op shoe Lt  Restrictions Weight Bearing Restrictions: Yes LLE Weight Bearing: Non weight bearing LLE Partial Weight  Bearing Percentage or Pounds: Darco shoe  Pain: Pain Assessment Pain Assessment: 0-10 Pain Score: 2  Pain Type: Acute pain Pain Location: Foot Pain Orientation: Left Pain Descriptors / Indicators: Discomfort Pain Intervention(s): Repositioned ADL: See Function Navigator for Current Functional Status.   Therapy/Group: Individual Therapy  Zonia Caplin,[place] OTR/L 01/01/2017, 4:14 PM

## 2017-01-01 NOTE — Progress Notes (Signed)
1610 01/01/17 nursing V/S 127/43 87 98 . Patient claims that he feels like he's in the basement. Some tremors noted. Wife claims maybe his pain medications causing it. MD made aware.

## 2017-01-01 NOTE — Progress Notes (Signed)
[facility]     PROGRESS NOTE  Subjective/Complaints:   Up at EOB eating breakfast. Leaning backwards despite pillows behind. Feels like "he's in the basement"--woozy. BP"s low yesterday afternoon  ROS: pt denies nausea, vomiting, diarrhea, cough, shortness of breath or chest pain   Objective: Vital Signs: Blood pressure (!) 127/43, pulse 87, temperature 97.8 F (36.6 C), temperature source Oral, resp. rate 18, height  (1.753 m), weight 90.8 kg (200 lb 2.8 oz), SpO2 98 %. No results found.  Recent Labs  12/31/16 0602  WBC 12.2*  HGB 9.2*  HCT 28.5*  PLT 418*    Recent Labs  12/31/16 0602 01/01/17 0538  NA 128* 127*  K 4.4 4.8  CL 91* 90*  GLUCOSE 315* 258*  BUN 39* 45*  CREATININE 7.04* 7.00*  CALCIUM 8.1* 8.0*   CBG (last 3)   Recent Labs  12/31/16 1749 12/31/16 2043 01/01/17 0641  GLUCAP 90 138* 269*    Wt Readings from Last 3 Encounters:  01/01/17 90.8 kg (200 lb 2.8 oz)  12/27/16 89.2 kg (196 lb 10.4 oz)  12/14/16 92.1 kg (203 lb)    Physical Exam:  BP (!) 127/43   Pulse 87   Temp 97.8 F (36.6 C) (Oral)   Resp 18   Ht  (1.753 m)   Wt 90.8 kg (200 lb 2.8 oz)   SpO2 98%   BMI 29.56 kg/m  Constitutional: He appears well-developedand well-nourished. No distress.  HENT: Normocephalicand atraumatic.  Eyes: EOMare normal. No discharge. Cardiovascular: RRR with murmur Respiratory: CTA Bilaterally without wheezes or rales. Normal effort  GI: Bowel sounds are normal. He exhibits no distension.  Musculoskeletal: He exhibits tenderness. He exhibits no edema.  Neurological: He is alertand oriented.  Motor: B/l UE 5/5 prox to distal.  LLE 4+/5 HF, KE, 4-/5 ADF/PF (pain inhibition).  RLE: 4+/5  HF, KE and ADF/PF.  A little slow to process. Leans back/almost pushing backwards when sitting up Skin: Skin is warmand dry. He is not diaphoretic.  Dry dressing left TMA in place.  Right groin wound packed  PD  sites dressed Psychiatric: He has a normal mood and affect. His behavior is normal. Judgmentand thought contentnormal.    Assessment/Plan: 1. Functional deficits secondary to left TMA which require 3+ hours per day of interdisciplinary therapy in a comprehensive inpatient rehab setting. Physiatrist is providing close team supervision and 24 hour management of active medical problems listed below. Physiatrist and rehab team continue to assess barriers to discharge/monitor patient progress toward functional and medical goals.  Function:  Bathing Bathing position   Position: Wheelchair/chair at sink  Bathing parts Body parts bathed by patient: Right arm, Left arm, Chest, Abdomen, Front perineal area, Right upper leg, Left upper leg, Right lower leg, Buttocks Body parts bathed by helper: Back  Bathing assist Assist Level: Touching or steadying assistance(Pt > 75%)      Upper Body Dressing/Undressing Upper body dressing   What is the patient wearing?: Pull over shirt/dress     Pull over shirt/dress - Perfomed by patient: Thread/unthread right sleeve, Thread/unthread left sleeve, Put head through opening, Pull shirt over trunk Pull over shirt/dress - Perfomed by helper: Pull shirt over trunk        Upper body assist Assist Level: Supervision or verbal cues      Lower Body Dressing/Undressing Lower body dressing   What is the patient wearing?: Pants, Underwear, Socks, Shoes Underwear - Performed by patient: Thread/unthread right  underwear leg, Thread/unthread left underwear leg, Pull underwear up/down Underwear - Performed by helper: Thread/unthread right underwear leg, Thread/unthread left underwear leg, Pull underwear up/down Pants- Performed by patient: Thread/unthread right pants leg, Thread/unthread left pants leg, Pull pants up/down, Fasten/unfasten pants Pants- Performed by helper: Fasten/unfasten pants, Pull pants up/down, Thread/unthread left pants leg, Thread/unthread  right pants leg Non-skid slipper socks- Performed by patient: Don/doff right sock Non-skid slipper socks- Performed by helper: Don/doff right sock (n/a left sock)     Shoes - Performed by patient: Don/doff right shoe            Lower body assist Assist for lower body dressing: Touching or steadying assistance (Pt > 75%)      Toileting Toileting Toileting activity did not occur: N/A        Toileting assist     Transfers Chair/bed transfer   Chair/bed transfer method: Squat pivot Chair/bed transfer assist level: Touching or steadying assistance (Pt > 75%) Chair/bed transfer assistive device: Armrests     Locomotion Ambulation Ambulation activity did not occur: Safety/medical concerns   Max distance: 5' Assist level: Touching or steadying assistance (Pt > 75%)   Wheelchair   Type: Manual Max wheelchair distance: 150 feet Assist Level: Supervision or verbal cues  Cognition Comprehension Comprehension assist level: Follows complex conversation/direction with no assist  Expression Expression assist level: Expresses complex ideas: With extra time/assistive device  Social Interaction Social Interaction assist level: Interacts appropriately with others - No medications needed.  Problem Solving Problem solving assist level: Solves complex problems: Recognizes & self-corrects  Memory Memory assist level: Complete Independence: No helper    Medical Problem List and Plan: 1. Functional and mobility deficits secondary to PAD requiring left TMA, right groin wound, multiple medical  Cont CIR PT< OT 2. DVT Prophylaxis/Anticoagulation: Pharmaceutical: Heparin 3. Pain Management: continue oxycodone prn.   -need to watch tolerance of oxycodone--- I suspect his "wooziness" is related to it. However, he continues to have pain---observe today---may need to consider alternative 4. Mood: team to provide ego support. LCSW to follow for evaluation and support.  5. Neuropsych: This  patient iscapable of making decisions on hisown behalf. 6. Skin/Wound Care: dry dressing left foot  Monitor right groin wound continue dry dressing to groin 7. Fluids/Electrolytes/Nutrition: Monitor I/O. PD in evenings after therapy. 8. T2DM:   Used NPH at nights with novolog 20 units tid at home, lantus d/ced Novolog 15 TID started 9/20.   Monitor BS ac/hs.   Continue SSI for elevated BS.   AM CBGs elevated. home NPH dose is 70U qhs,   -increased HS NPH to 40u on 9/22 (still 269 this morning)   -observe today 9. Constipation: Increased colace to home regimen with sorbitol daily prn.  10. OSA: has been compliant with BIPAP at nights.  11. Leucocytosis:   Afebrile  WBCs 12.3 on 9/20  Cont to monitor 12. Anemia of chronic disease: Continue to monitor.   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 History of CHF: Monitor weight daily--fluid mangedwith PD. On ASA, lipitor, coreg and lasix bid.  (cozaar and lasix on hold for now)   [employer]   12/31/16 0500 12/31/16 0600 01/01/17 0500  Weight: 90.7 kg (199 lb 15.3 oz) 90.7 kg (199 lb 15.3 oz) 90.8 kg (200 lb 2.8 oz)   15. ESRD: On PD daily at nights. Tolerating it well at present 16.  Constipation- senna S 2 po BID, dulcolax this  pm 17. Hyponatremia: down to 127 today  -mgt per nephrology 18. Light-headedness/altered balance  -supsect it's related to oxycodone  -lasix and cozaar on hold for now  -encourage fluids  -volume/sodium mgt per nephrology  LOS (Days) 5 A FACE TO FACE EVALUATION WAS PERFORMED  Riese Hellard T 01/01/2017 8:27 AM

## 2017-01-01 NOTE — Progress Notes (Signed)
  [facility] Progress Note   Assessment/ Plan:    Dialysis Orders: Center: [facility] home [facility]  on CCPD . 6 exchanges/day one mid-day exchange (occurs aroudn 9am).  2.5 liters of 2.5% per CCPD and 2 liters of 2.5% for last fill and mid day drain.  1. 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  S/p L TMT 12/23/16 with Dr. Lajoyce Corners.  2. ESRD- CCPD, using 2.5% dextrose; To facilitate CIR doing: CCPD 2.5L x 5 exchanges, , Dry Day.  At DC can resume prev PD Rx. 3. Hypertension/volume- BP stable 4. Anemia (ABLA)- TSAT 45% and Ferritin 2284, cont Aranesp 100 qThurs 5. Constipation: miralax as needed 6. Hypokalemia: supplementing 7. Myoclonus / COnfusion: stop gabapentin and robaxin, limit narcotics, follow  Subjective:    Having some myoclonus and tremor Muddy thinking Rec Robaxin, gapapentin, oxycodone for pain yesterday after wound vac came off   Objective:   BP (!) 127/43   Pulse 87   Temp 97.8 F (36.6 C) (Oral)   Resp 18   Ht  (1.753 m)   Wt 90.8 kg (200 lb 2.8 oz)   SpO2 98%   BMI 29.56 kg/m   Physical Exam: GEN NAD HEENT EOMI, PULM clear bilaterally CV RRR no m/r/g ABD distended, NABS, PD cath c/d/i EXT L LE s/p TMT , trace LLE edema, no RLE edema.   Labs: BMET  Recent Labs Lab 12/27/16 0900 12/29/16 0601 12/30/16 0816 12/31/16 0602 01/01/17 0538  NA 131* 130* 128* 128* 127*  K 3.4* 4.8 4.6 4.4 4.8  CL 96* 91* 91* 91* 90*  CO2 GLUCOSE 350* 466* 524* 315* 258*  BUN 26* 32* 37* 39* 45*  CREATININE 6.31* 7.29* 7.40* 7.04* 7.00*  CALCIUM 8.3* 8.1* 8.3* 8.1* 8.0*  PHOS 3.0 3.9  --   --   --    CBC  Recent Labs Lab 12/27/16 0900 12/29/16 0601 12/31/16 0602  WBC 13.1* 12.3* 12.2*  HGB 10.6* 9.0* 9.2*  HCT 32.8* 28.5* 28.5*  MCV 95.6 96.6 95.0  PLT 357 353 418*    @ Medications:    . aspirin EC  81 mg Oral Daily  . atorvastatin  80 mg Oral q1800  .  calcium acetate  667 mg Oral TID WC  . carvedilol  12.5 mg Oral BID WC  . clopidogrel  75 mg Oral Daily  . darbepoetin (ARANESP) injection - DIALYSIS  100 mcg Subcutaneous Q Thu-1800  . feeding supplement (NEPRO CARB STEADY)  237 mL Oral TID WC  . gentamicin cream  1 application Topical Daily  . heparin  5,000 Units Subcutaneous Q8H  . insulin aspart  0-9 Units Subcutaneous TID WC  . insulin aspart  15 Units Subcutaneous TID WC  . insulin NPH Human  40 Units Subcutaneous QHS  . isosorbide mononitrate  30 mg Oral Daily  . mouth rinse  15 mL Mouth Rinse BID  . multivitamin  1 tablet Oral QHS  . pantoprazole  40 mg Oral Daily  . potassium chloride  20 mEq Oral BID  . senna-docusate  2 tablet Oral BID  . [START ON 01/08/2017] Vitamin D (Ergocalciferol)  50,000 Units Oral Q30 days     [facility] 01/01/2017, 10:14 AM

## 2017-01-02 ENCOUNTER — Inpatient Hospital Stay (HOSPITAL_COMMUNITY): Payer: Medicare Other

## 2017-01-02 ENCOUNTER — Inpatient Hospital Stay (HOSPITAL_COMMUNITY): Payer: Medicare Other | Admitting: Occupational Therapy

## 2017-01-02 ENCOUNTER — Telehealth (INDEPENDENT_AMBULATORY_CARE_PROVIDER_SITE_OTHER): Payer: Self-pay | Admitting: Orthopedic Surgery

## 2017-01-02 DIAGNOSIS — I509 Heart failure, unspecified: Secondary | ICD-10-CM

## 2017-01-02 LAB — COMPREHENSIVE METABOLIC PANEL
ALK PHOS: 68 U/L (ref 38–126)
ALT: 18 U/L (ref 17–63)
ANION GAP: 11 (ref 5–15)
AST: 25 U/L (ref 15–41)
Albumin: 1.7 g/dL — ABNORMAL LOW (ref 3.5–5.0)
BILIRUBIN TOTAL: 0.4 mg/dL (ref 0.3–1.2)
BUN: 47 mg/dL — ABNORMAL HIGH (ref 6–20)
CALCIUM: 8.3 mg/dL — AB (ref 8.9–10.3)
CO2: 26 mmol/L (ref 22–32)
Chloride: 90 mmol/L — ABNORMAL LOW (ref 101–111)
Creatinine, Ser: 7.11 mg/dL — ABNORMAL HIGH (ref 0.61–1.24)
GFR, EST AFRICAN AMERICAN: 8 mL/min — AB (ref >60)
GFR, EST NON AFRICAN AMERICAN: 7 mL/min — AB (ref >60)
Glucose, Bld: 383 mg/dL — ABNORMAL HIGH (ref 65–99)
Potassium: 5 mmol/L (ref 3.5–5.1)
SODIUM: 127 mmol/L — AB (ref 135–145)
TOTAL PROTEIN: 5.8 g/dL — AB (ref 6.5–8.1)

## 2017-01-02 LAB — URINALYSIS, ROUTINE W REFLEX MICROSCOPIC
Bilirubin Urine: NEGATIVE
HGB URINE DIPSTICK: NEGATIVE
KETONES UR: NEGATIVE mg/dL
LEUKOCYTES UA: NEGATIVE
Nitrite: NEGATIVE
PH: 7 (ref 5.0–8.0)
PROTEIN: 100 mg/dL — AB
Specific Gravity, Urine: 1.008 (ref 1.005–1.030)
Squamous Epithelial / LPF: NONE SEEN

## 2017-01-02 LAB — CBC WITH DIFFERENTIAL/PLATELET
BASOS ABS: 0 10*3/uL (ref 0.0–0.1)
Basophils Relative: 0 %
EOS ABS: 0.1 10*3/uL (ref 0.0–0.7)
Eosinophils Relative: 1 %
HCT: 29.5 % — ABNORMAL LOW (ref 39.0–52.0)
HEMOGLOBIN: 9.5 g/dL — AB (ref 13.0–17.0)
LYMPHS PCT: 13 %
Lymphs Abs: 1.5 10*3/uL (ref 0.7–4.0)
MCH: 30.9 pg (ref 26.0–34.0)
MCHC: 32.2 g/dL (ref 30.0–36.0)
MCV: 96.1 fL (ref 78.0–100.0)
Monocytes Absolute: 1.2 10*3/uL — ABNORMAL HIGH (ref 0.1–1.0)
Monocytes Relative: 10 %
NEUTROS PCT: 76 %
Neutro Abs: 8.7 10*3/uL — ABNORMAL HIGH (ref 1.7–7.7)
PLATELETS: 428 10*3/uL — AB (ref 150–400)
RBC: 3.07 MIL/uL — AB (ref 4.22–5.81)
RDW: 14.5 % (ref 11.5–15.5)
WBC: 11.5 10*3/uL — AB (ref 4.0–10.5)

## 2017-01-02 LAB — GLUCOSE, CAPILLARY
GLUCOSE-CAPILLARY: 350 mg/dL — AB (ref 65–99)
GLUCOSE-CAPILLARY: 234 mg/dL — AB (ref 65–99)
Glucose-Capillary: 246 mg/dL — ABNORMAL HIGH (ref 65–99)
Glucose-Capillary: 333 mg/dL — ABNORMAL HIGH (ref 65–99)

## 2017-01-02 MED ORDER — INSULIN NPH (HUMAN) (ISOPHANE) 100 UNIT/ML ~~LOC~~ SUSP
20.0000 [IU] | Freq: Every day | SUBCUTANEOUS | Status: DC
  Administered 2017-01-03: 20 [IU] via SUBCUTANEOUS

## 2017-01-02 MED ORDER — PREGABALIN 25 MG PO CAPS
25.0000 mg | ORAL_CAPSULE | Freq: Every day | ORAL | Status: DC
  Filled 2017-01-02: qty 1

## 2017-01-02 MED ORDER — PREGABALIN 25 MG PO CAPS: 25.0000 mg | ORAL_CAPSULE | Freq: Three times a day (TID) | ORAL | Status: DC

## 2017-01-02 MED ORDER — INSULIN ASPART 100 UNIT/ML ~~LOC~~ SOLN
0.0000 [IU] | Freq: Three times a day (TID) | SUBCUTANEOUS | Status: DC
  Administered 2017-01-02 – 2017-01-03 (×3): 7 [IU] via SUBCUTANEOUS
  Administered 2017-01-04: 4 [IU] via SUBCUTANEOUS
  Administered 2017-01-04: 20 [IU] via SUBCUTANEOUS
  Administered 2017-01-04 – 2017-01-05 (×3): 7 [IU] via SUBCUTANEOUS
  Administered 2017-01-05: 4 [IU] via SUBCUTANEOUS
  Administered 2017-01-06: 3 [IU] via SUBCUTANEOUS

## 2017-01-02 MED ORDER — INSULIN ASPART 100 UNIT/ML ~~LOC~~ SOLN
0.0000 [IU] | Freq: Every day | SUBCUTANEOUS | Status: DC
  Administered 2017-01-02: 4 [IU] via SUBCUTANEOUS
  Administered 2017-01-03 – 2017-01-04 (×2): 2 [IU] via SUBCUTANEOUS
  Administered 2017-01-05: 3 [IU] via SUBCUTANEOUS

## 2017-01-02 NOTE — Telephone Encounter (Signed)
I called and spoke with [facility] to advise of message below.

## 2017-01-02 NOTE — Progress Notes (Signed)
Pt was awaken this morning for dressing changes. Once awake, pt began to have involuntarily spastic tremors. MD notified and orders for a CBC with Diff and CMP ordered

## 2017-01-02 NOTE — Progress Notes (Signed)
Occupational Therapy Session Note  Patient Details  Name: [name] MRN: [id number] Date of Birth: January 22, 1941  Today's Date: 01/02/2017 OT Individual Time: 1302-1340 OT Individual Time Calculation (min): 38 min    Short Term Goals: Week 1:  OT Short Term Goal 1 (Week 1): Pt will transfer to toilet with min A  OT Short Term Goal 2 (Week 1): Pt will perform sit to stand with min A in prep for clothing management while maintaining weight bearing precautions OT Short Term Goal 3 (Week 1): Pt wil perform bed mobilty without bed rails with supervision/ extra time OT Short Term Goal 4 (Week 1): Pt will navigate w/c around room to obtain clothing for ADL with min A  OT Short Term Goal 5 (Week 1): Pt will perform 3/3 grooming tasks with setup  Skilled Therapeutic Interventions/Progress Updates:    Pt received supine in bed, agreeable to OT tx session, though reporting having increased difficulty with mobility today. Pt requires ModA to transfer to sitting EOB and to initially maintain static sitting balance as Pt demonstrating strong posterior lean. Pt completed squat pivot transfer EOB>w/c with MaxA and increased time. Tremors/shakiness noted in UEs during session. Pt reporting need to have BM, completed stand pivot transfer w/c to [facility] over toilet with MaxA for sit<>stand at [facility] and MaxA for completing transfer and for adhering to NWB in LLE as Pt demonstrating increased difficulty maintaining NWB status throughout session. Due to safety concerns and Pt's increased fatigue, obtained +2 assist prior to completing clothing management for toileting. Pt completed sit<>stand at RW with MaxA+2 (RN present as +2 assist and made aware of Pt's current status/increased need for physical assist). Utilized stedy for remainder of session for safe completion of mobility. Pt completed sit<>stand at stedy with MaxA and total assist for perihygiene after BM. Pt returned to EOB using stedy, returned to supine in bed  with ModA for LE management. Pt left supine in bed, spouse and nephrology MD present, call bell and needs within reach.   Therapy Documentation Precautions:  Precautions Precautions: Fall Precaution Comments: PD port Required Braces or Orthoses: Other Brace/Splint Other Brace/Splint: post op shoe Lt  Restrictions Weight Bearing Restrictions: Yes LLE Weight Bearing: Non weight bearing LLE Partial Weight Bearing Percentage or Pounds: Darco shoe   Pain: Pain Assessment Pain Assessment: No/denies pain Pain Score: Asleep Pain Type: Acute pain Pain Location: Back Pain Descriptors / Indicators: Aching Pain Frequency: Intermittent Pain Onset: With Activity Patients Stated Pain Goal: 4 Pain Intervention(s): Medication (See eMAR) ADL: ADL ADL Comments: see functional navigator  See Function Navigator for Current Functional Status.   Therapy/Group: Individual Therapy  Orlando Penner 01/02/2017, 4:04 PM

## 2017-01-02 NOTE — Progress Notes (Addendum)
Wife with concerns that patient is declining and reports that tremors now preventing him from feeding himself. Question uremia v/s medication SE.  Left 5th toe ulcer (vascular?) with dark eschar but without drainage.   L-transmetatarsal site with minimal bloody drainage on dressing at lateral aspect.  Area of blistering at proximal aspec. Difficulty with NWB reported--may need to focus on SB transfers.  Will contact Dr. Lajoyce Corners to evaluated wound.

## 2017-01-02 NOTE — Telephone Encounter (Signed)
Pamela from [facility] called asking for Dr. Lajoyce Corners to do a consultation on the patient, he is currently in 4M08. She states that she believes cellulitis is starting to form and is concerned. CB # [phone]

## 2017-01-02 NOTE — Progress Notes (Signed)
  Progress Note    01/02/2017 9:57 AM * No surgery found *  Subjective:  Denies pain in the right foot.    Tm 99.8 now afebrile  Vitals:   01/02/17 0553 01/02/17 0800  BP: 126/69 (!) 142/49  Pulse: 84 93  Resp: 18   Temp: 99.8 F (37.7 C) 98.7 F (37.1 C)  SpO2: 98%     Physical Exam: Cardiac:  regular Lungs:  Non labored Incisions:  Right groin still with hematoma, however, erythema has improved from when I saw him last and there is no drainage from the wound.  Extremities:  + right peroneal doppler signal.  Dressing intact left foot and is clean and dry.       CBC    Component Value Date/Time   WBC 11.5 (H) 01/02/2017 0756   RBC 3.07 (L) 01/02/2017 0756   HGB 9.5 (L) 01/02/2017 0756   HCT 29.5 (L) 01/02/2017 0756   PLT 428 (H) 01/02/2017 0756   MCV 96.1 01/02/2017 0756   MCH 30.9 01/02/2017 0756   MCHC 32.2 01/02/2017 0756   RDW 14.5 01/02/2017 0756   LYMPHSABS PENDING 01/02/2017 0756   MONOABS PENDING 01/02/2017 0756   EOSABS PENDING 01/02/2017 0756   BASOSABS PENDING 01/02/2017 0756    BMET    Component Value Date/Time   NA 127 (L) 01/02/2017 0756   K 5.0 01/02/2017 0756   CL 90 (L) 01/02/2017 0756   CO2 26 01/02/2017 0756   GLUCOSE 383 (H) 01/02/2017 0756   BUN 47 (H) 01/02/2017 0756   CREATININE 7.11 (H) 01/02/2017 0756   CALCIUM 8.3 (L) 01/02/2017 0756   GFRNONAA 7 (L) 01/02/2017 0756   GFRAA 8 (L) 01/02/2017 0756    INR    Component Value Date/Time   INR 1.22 09/21/2015 1100     Intake/Output Summary (Last 24 hours) at 01/02/17 0957 Last data filed at 01/01/17 1700  Gross per 24 hour  Intake              480 ml  Output                0 ml  Net              480 ml     Assessment:  76 y.o. male is s/p:  1.  Open repair of right common femoral pseudoaneurysm 2.  US guided cannulation of left anterior tibial artery 3.  CSI orbital atherectomy of left AT with 1.5 diamondback 4.  Balloon angioplasty of left AT with 3mm balloon,  Left DP with 2.15mm balloon  5.  Resection of left 3rd metatarsal head 6.  Right lower extremity angiogram  12/15/16 And  Left TMA 12/23/16 (Dr. Lajoyce Corners)  Plan: -asked to come see pt about new ulcer on right 5th toe.  On arteriogram on 12/15/16, The right lower extremity demonstrated less than 50% stenosis throughout the SFA that was non-flow-limiting and a peroneal artery only to the level of the ankle with no anterior tibial or posterior tibial arteries identified.  He does have a right peroneal doppler signal present today.  Will d/w [name] and he will be by to see the pt tomorrow as he is in the office today.  Continue to protect ulcer with dressing. -right groin looks good and erythema improved from when I saw him last.  Continue dry dressing to right groin.    Doreatha Massed, PA-C [facility] [phone] 01/02/2017 9:57 AM

## 2017-01-02 NOTE — Progress Notes (Signed)
Patient noted to be behaving differently this morning.  He was hallucinating, unable to maintain balance, having jerking movements, lethargic, and overall just "feeling bad."  Patient continued with therapy as best as possible but needing significantly more help than usual.  Obtained a urine specimen to evaluate for possible infection.  Head CT performed to r/o stroke as a means of his AMS.  Patient hooked up to peritoneal dialysis around 1700.  Patient states he is feeling better, is more alert now.  He was able to sit on the side of the bed and feed himself dinner without difficulty.  He reports he is still having difficulty with visual depth perception, but is no longer hallucinating.  Notified Pam Love, PA of improvement in patients status.  Will continue to monitor patient closely.  Dani Gobble, RN

## 2017-01-02 NOTE — Progress Notes (Signed)
[name] Progress Note   Assessment/ Plan:    Dialysis Orders: Center: [facility]  on CCPD . 6 exchanges/day one mid-day exchange (occurs aroudn 9am).  2.5 liters of 2.5% per CCPD and 2 liters of 2.5% for last fill and mid day drain.  1. 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  S/p L TMT 12/23/16 with Dr. Lajoyce Corners.  2. ESRD- CCPD, using 2.5% dextrose; To facilitate CIR doing: CCPD 2.5L x 5 exchanges, , Dry Day.  At DC can resume prev PD Rx. 3. Hypertension/volume- BP stable- hyponatremia argues for volume overload- on all 2.5% fluid- will add one 4.25% bag  4. Anemia (ABLA)- TSAT 45% and Ferritin 2284, cont Aranesp 100 qThurs 5. Constipation: miralax as needed 6. Hypokalemia: supplementing- is 5 today, will stop  7. Myoclonus / COnfusion: stop gabapentin and robaxin, limit narcotics, follow 8. Bones- cont renavite [name] - last phos has been ok - will check tomorrow  Subjective:    According to family- is not better today - unsteady- he thinks he is better PD only removed a little over a liter     Objective:   BP (!) 142/49   Pulse 93   Temp 98.7 F (37.1 C) (Axillary)   Resp 18   Ht  (1.753 m)   Wt 93.4 kg (205 lb 12.8 oz)   SpO2 98%   BMI 30.39 kg/m   Physical Exam: GEN NAD HEENT EOMI, PULM clear bilaterally CV RRR no m/r/g ABD distended, NABS, PD cath c/d/i EXT L LE s/p TMT , trace LLE edema, no RLE edema.   Labs: BMET  Recent Labs Lab 12/27/16 0900 12/29/16 0601 12/30/16 0816 12/31/16 0602 01/01/17 0538 01/02/17 0756  NA 131* 130* 128* 128* 127* 127*  K 3.4* 4.8 4.6 4.4 4.8 5.0  CL 96* 91* 91* 91* 90* 90*  CO2 GLUCOSE 350* 466* 524* 315* 258* 383*  BUN 26* 32* 37* 39* 45* 47*  CREATININE 6.31* 7.29* 7.40* 7.04* 7.00* 7.11*  CALCIUM 8.3* 8.1* 8.3* 8.1* 8.0* 8.3*  PHOS 3.0 3.9  --   --   --   --    CBC  Recent Labs Lab 12/27/16 0900 12/29/16 0601  12/31/16 0602 01/02/17 0756  WBC 13.1* 12.3* 12.2* 11.5*  NEUTROABS  --   --   --  8.7*  HGB 10.6* 9.0* 9.2* 9.5*  HCT 32.8* 28.5* 28.5* 29.5*  MCV 95.6 96.6 95.0 96.1  PLT 357 353 418* 428*    @ Medications:    . aspirin EC  81 mg Oral Daily  . atorvastatin  80 mg Oral q1800  . calcium acetate  667 mg Oral TID WC  . carvedilol  12.5 mg Oral BID WC  . clopidogrel  75 mg Oral Daily  . darbepoetin (ARANESP) injection - DIALYSIS  100 mcg Subcutaneous Q Thu-1800  . feeding supplement (NEPRO CARB STEADY)  237 mL Oral TID WC  . gentamicin cream  1 application Topical Daily  . heparin  5,000 Units Subcutaneous Q8H  . insulin aspart  0-20 Units Subcutaneous TID WC  . insulin aspart  0-5 Units Subcutaneous QHS  . [START ON 01/03/2017] insulin NPH Human  20 Units Subcutaneous QAC breakfast  . insulin NPH Human  40 Units Subcutaneous QHS  . isosorbide mononitrate  30 mg Oral Daily  . mouth rinse  15 mL Mouth Rinse BID  . multivitamin  1 tablet  Oral QHS  . pantoprazole  40 mg Oral Daily  . potassium chloride  20 mEq Oral BID  . senna-docusate  2 tablet Oral BID  . [START ON 01/08/2017] Vitamin D (Ergocalciferol)  50,000 Units Oral Q30 days     [name] 01/02/2017, 1:33 PM

## 2017-01-02 NOTE — Progress Notes (Signed)
Occupational Therapy Session Note  Patient Details  Name: [name] MRN: [id number] Date of Birth: [birth year]  Today's Date: 01/02/2017 OT Individual Time: 8295-6213 OT Individual Time Calculation (min): 61 min    Short Term Goals: Week 1:  OT Short Term Goal 1 (Week 1): Pt will transfer to toilet with min A  OT Short Term Goal 2 (Week 1): Pt will perform sit to stand with min A in prep for clothing management while maintaining weight bearing precautions OT Short Term Goal 3 (Week 1): Pt wil perform bed mobilty without bed rails with supervision/ extra time OT Short Term Goal 4 (Week 1): Pt will navigate w/c around room to obtain clothing for ADL with min A  OT Short Term Goal 5 (Week 1): Pt will perform 3/3 grooming tasks with setup  Skilled Therapeutic Interventions/Progress Updates:    Pt resting in bed upon OT arrival and was agreeable to participating in self-care tasks.  Pt stated "somethings not right, I feel funny, but I'm willing to try."  Vitals assessed, BP 121/48, HR 94, SAO2 95% on RA, remained normal throughout session.  Supine>sit Mod A, Min A to maintain sitting balance EOB d/t posterior lean.  Pt stood with RW and Max A, strong posterior lean and unable to adhere to NWBing status of LLE, returned to sitting.  Pt performed bed>w/c modified squat pivot transfer with Max A.  Increased A provided during sponge bathing at sink as pt perseverated on bathing abdomen and thighs, Mod VCing for sequencing and to complete task thoroughly.  Increased A also required with UB/LB dressing this date due to fatigue and impaired sequencing.  Pt stood at sink with Max A and required total A to manage LB dressing.  Returned to bed (Max A squat pivot transfer) and left resting semi-supine with spouse present and all other needs within reach.  Notified RN and PA of pt's status.  Therapy Documentation Precautions:  Precautions Precautions: Fall Precaution Comments: PD port Required Braces or  Orthoses: Other Brace/Splint Other Brace/Splint: post op shoe Lt  Restrictions Weight Bearing Restrictions: Yes LLE Weight Bearing: Non weight bearing LLE Partial Weight Bearing Percentage or Pounds: Darco shoe Pain: Pain Assessment Pain Assessment: No/denies pain Pain Score: 0-No pain Faces Pain Scale: Hurts little more Pain Type: Neuropathic pain Pain Location: Foot Pain Orientation: Left Pain Descriptors / Indicators: Shooting Pain Onset: On-going See Function Navigator for Current Functional Status.  Therapy/Group: Individual Therapy  Deitra Mayo 01/02/2017, 12:47 PM

## 2017-01-02 NOTE — Telephone Encounter (Signed)
I will see patient in the morning.

## 2017-01-02 NOTE — Progress Notes (Signed)
[place] PHYSICAL MEDICINE & REHABILITATION     PROGRESS NOTE  Subjective/Complaints:  Pt seen laying in bed this AM.  He slept well overnight, but notes that he had a rough yesterday with phantom limb pain.  Wife also notes jerking.  Informed by nursing regarding low blood pressures.   ROS: +Muscle jerks, phantom limb pain. Denies nausea, vomiting, diarrhea, shortness of breath or chest pain   Objective: Vital Signs: Blood pressure 126/69, pulse 84, temperature 99.8 F (37.7 C), temperature source Oral, resp. rate 18, height  (1.753 m), weight 93.4 kg (205 lb 12.8 oz), SpO2 98 %. No results found.  Recent Labs  12/31/16 0602  WBC 12.2*  HGB 9.2*  HCT 28.5*  PLT 418*    Recent Labs  12/31/16 0602 01/01/17 0538  NA 128* 127*  K 4.4 4.8  CL 91* 90*  GLUCOSE 315* 258*  BUN 39* 45*  CREATININE 7.04* 7.00*  CALCIUM 8.1* 8.0*   CBG (last 3)   Recent Labs  01/01/17 1637 01/01/17 2104 01/02/17 0708  GLUCAP 93 333* 350*    Wt Readings from Last 3 Encounters:  01/02/17 93.4 kg (205 lb 12.8 oz)  12/27/16 89.2 kg (196 lb 10.4 oz)  12/14/16 92.1 kg (203 lb)    Physical Exam:  BP 126/69 (BP Location: Right Arm)   Pulse 84   Temp 99.8 F (37.7 C) (Oral)   Resp 18   Ht  (1.753 m)   Wt 93.4 kg (205 lb 12.8 oz)   SpO2 98%   BMI 30.39 kg/m  Constitutional: He appears well-developedand well-nourished. No distress.  HENT: Normocephalicand atraumatic.  Eyes: EOMare normal. No discharge. Cardiovascular: RRR with murmur. No JVD. Respiratory: CTA Bilaterally. Normal effort  GI: Bowel sounds are normal. He exhibits no distension.  Musculoskeletal: He exhibits tenderness. He exhibits mild edema.  Neurological: He is alertand oriented.  Motor: B/l UE 5/5 prox to distal.  LLE 4+/5 HF, KE, 4/5 ADF/PF (pain inhibition).  RLE: 4+/5  HF, KE and ADF/PF.  Skin: Skin is warmand dry. He is not diaphoretic.  Left TMA with some dried blood with sutures intact.   Right groin wound packed  PD sites dressed +Muscle jerks Psychiatric: He has a normal mood and affect. His behavior is normal. Judgmentand thought contentnormal.    Assessment/Plan: 1. Functional deficits secondary to left TMA which require 3+ hours per day of interdisciplinary therapy in a comprehensive inpatient rehab setting. Physiatrist is providing close team supervision and 24 hour management of active medical problems listed below. Physiatrist and rehab team continue to assess barriers to discharge/monitor patient progress toward functional and medical goals.  Function:  Bathing Bathing position   Position: Wheelchair/chair at sink  Bathing parts Body parts bathed by patient: Right arm, Left arm, Chest, Abdomen, Right upper leg, Left upper leg, Right lower leg Body parts bathed by helper: Front perineal area, Buttocks  Bathing assist Assist Level: Touching or steadying assistance(Pt > 75%)      Upper Body Dressing/Undressing Upper body dressing   What is the patient wearing?: Pull over shirt/dress     Pull over shirt/dress - Perfomed by patient: Thread/unthread right sleeve, Thread/unthread left sleeve, Put head through opening, Pull shirt over trunk Pull over shirt/dress - Perfomed by helper: Pull shirt over trunk        Upper body assist Assist Level: Supervision or verbal cues      Lower Body Dressing/Undressing Lower body dressing   What is the patient  wearing?: Pants, Socks, Shoes Underwear - Performed by patient: Thread/unthread right underwear leg, Thread/unthread left underwear leg, Pull underwear up/down Underwear - Performed by helper: Thread/unthread right underwear leg, Thread/unthread left underwear leg, Pull underwear up/down Pants- Performed by patient: Thread/unthread right pants leg, Thread/unthread left pants leg, Pull pants up/down Pants- Performed by helper: Fasten/unfasten pants, Pull pants up/down, Thread/unthread left pants leg,  Thread/unthread right pants leg Non-skid slipper socks- Performed by patient: Don/doff right sock Non-skid slipper socks- Performed by helper: Don/doff right sock (n/a left sock) Socks - Performed by patient: Don/doff right sock   Shoes - Performed by patient: Don/doff right shoe Shoes - Performed by helper: Don/doff left shoe          Lower body assist Assist for lower body dressing: Touching or steadying assistance (Pt > 75%)      Toileting Toileting Toileting activity did not occur: N/A (pt oliguric, dialysis pt)        Toileting assist     Transfers Chair/bed transfer   Chair/bed transfer method: Stand pivot Chair/bed transfer assist level: Maximal assist (Pt 25 - 49%/lift and lower) Chair/bed transfer assistive device: Armrests     Locomotion Ambulation Ambulation activity did not occur: Safety/medical concerns   Max distance: 5' Assist level: Touching or steadying assistance (Pt > 75%)   Wheelchair   Type: Manual Max wheelchair distance: 150 feet Assist Level: Supervision or verbal cues  Cognition Comprehension Comprehension assist level: Understands basic 90% of the time/cues < 10% of the time  Expression Expression assist level: Expresses complex ideas: With extra time/assistive device  Social Interaction Social Interaction assist level: Interacts appropriately 75 - 89% of the time - Needs redirection for appropriate language or to initiate interaction.  Problem Solving Problem solving assist level: Solves basic 75 - 89% of the time/requires cueing 10 - 24% of the time  Memory Memory assist level: Recognizes or recalls 75 - 89% of the time/requires cueing 10 - 24% of the time    Medical Problem List and Plan: 1. Functional and mobility deficits secondary to PAD requiring left TMA, right groin wound, multiple medical  Cont CIR 2. DVT Prophylaxis/Anticoagulation: Pharmaceutical: Heparin 3. Pain Management: continue oxycodone prn.   Trial low dose Lyrica for  phantom limb pain on 9/24 4. Mood: team to provide ego support. LCSW to follow for evaluation and support.  5. Neuropsych: This patient iscapable of making decisions on hisown behalf. 6. Skin/Wound Care: dry dressing left foot  Monitor right groin wound continue dry dressing to groin 7. Fluids/Electrolytes/Nutrition: Monitor I/O. PD in evenings after therapy. 8. T2DM:   Used NPH at nights with novolog at home, lantus d/ced Novolog 15 TID started 9/20.   Monitor BS ac/hs.   Continue SSI for elevated BS.   AM CBGs elevated. home NPH dose is 70U qhs,   increased HS NPH to 40u on 9/22   Remains extremely labile with elevations ~350  Will inquire further regarding home reg 9. Constipation: Increased colace to home regimen with sorbitol daily prn.  10. OSA: has been compliant with BIPAP at nights.  11. Leucocytosis:   Afebrile  WBCs 11.5 on 9/24  Cont to monitor 12. Anemia of chronic disease: Continue to monitor.   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 History of CHF: Monitor weight daily--fluid mangedwith PD. On ASA, lipitor, coreg and lasix bid.  (cozaar and lasix on hold for now)   [employer]   01/01/17  0500 01/01/17 0818 01/02/17 0553  Weight: 90.8 kg (200 lb 2.8 oz) 92.5 kg (203 lb 14.8 oz) 93.4 kg (205 lb 12.8 oz)   15. ESRD: On PD daily at nights. Tolerating it well at present 16.  Constipation- senna S 2 po BID, dulcolax this pm 17. Light-headedness/altered balance  -?medication +/- volume related  -lasix and cozaar on hold for now  -encourage fluids  LOS (Days) 6 A FACE TO FACE EVALUATION WAS PERFORMED  Ankit Karis Juba 01/02/2017 8:12 AM

## 2017-01-02 NOTE — Care Management Note (Signed)
[facility] Individual Statement of Services  Patient Name:  [name]  Date:  01/02/2017  Welcome to the [facility]patient [facility].  Our goal is to provide you with an individualized program based on your diagnosis and situation, designed to meet your specific needs.  With this comprehensive rehabilitation program, you will be expected to participate in at least 3 hours of rehabilitation therapies Monday-Friday, with modified therapy programming on the weekends.  Your rehabilitation program will include the following services:  Physical Therapy (PT), Occupational Therapy (OT), 24 hour per day rehabilitation nursing, Neuropsychology, Case Management (Social Worker), Rehabilitation Medicine, Nutrition Services and Pharmacy Services  Weekly team conferences will be held on Wednesdays to discuss your progress.  Your Social Worker will talk with you frequently to get your input and to update you on team discussions.  Team conferences with you and your family in attendance may also be held.  Expected length of stay: 12-14 days  Overall anticipated outcome: supervision/ min assist  Depending on your progress and recovery, your program may change. Your Social Worker will coordinate services and will keep you informed of any changes. Your Social Worker's name and contact numbers are listed  below.  The following services may also be recommended but are not provided by the [facility]:   Driving Evaluations  Home Health Rehabiltiation Services  Outpatient Rehabilitation Services    Arrangements will be made to provide these services after discharge if needed.  Arrangements include referral to agencies that provide these services.  Your insurance has been verified to be:  Medicare and BCBS Your primary doctor is:  Dr. Llana Aliment  Pertinent information will be shared with your doctor and your insurance company.  Social Worker:  Dossie Der,  SW [phone] or ([phone]  Information discussed with and copy given to patient by: [name], 01/02/2017, 10:15 AM

## 2017-01-02 NOTE — Progress Notes (Signed)
RT NOTE:  Pt has home CPAP in room. Wife hooked up and puts on patient when he is ready. RT will assist if needed.

## 2017-01-02 NOTE — Progress Notes (Signed)
Physical Therapy Session Note  Patient Details  Name: [name] MRN: [id number] Date of Birth: 1940-10-09  Today's Date: 01/02/2017 PT Individual Time: 1401-1446 PT Individual Time Calculation (min): 45 min  and Today's Date: 01/02/2017 PT Missed Time: 60 Minutes Missed Time Reason: CT/MRI;Other (Comment) (pt with decline in function over the past few days, CT scan ordered, hold therapy)  Short Term Goals: Week 1:  PT Short Term Goal 1 (Week 1): Pt to be mod I with supine<>sitting. PT Short Term Goal 2 (Week 1): Sit<>stand to with min assist and LRAD PT Short Term Goal 3 (Week 1): Pt to ambulate 5 ft with LRAD PT Short Term Goal 4 (Week 1): Up/down 1 step with mod assist.   Skilled Therapeutic Interventions/Progress Updates:    Pt supine in bed upon PT arrival, hesitant to participate in PT tx and reports he does not feel well at all. Pt transferred from supine to sitting EOB with mod assist, pt with decreased initiation and sequencing. Pt reported increased back pain in sitting 10/10, RN notified and brought tylenol. Pt transferred from bed to w/c squat pivot with max assist, pt with decreased initiation and motor planning to perform scooting, increased posterior lean with max assist to bring pt forward for anterior weightshift. Once in w/c, shortness of breath noted. Vital monitored: SpO2 79% back up to 95% with verbal cues for breathing techniques, BP 108/66, RN notified. Wife handed pt glasses and pt unable to put them on secondary to hand weakness and tremors. RN suggested holding therapy and stated she would call CT to see if they could come get him sooner. PT transferred pt back to bed using slideboard, lateral scoot with max assist. Pt transferred from sitting to supine with max assist, and max assist to scoot back up in bed. PT left supine in bed with needs in reach and wife present.   Therapy Documentation Precautions:  Precautions Precautions: Fall Precaution Comments: PD  port Required Braces or Orthoses: Other Brace/Splint Other Brace/Splint: post op shoe Lt  Restrictions Weight Bearing Restrictions: Yes LLE Weight Bearing: Non weight bearing LLE Partial Weight Bearing Percentage or Pounds: Darco shoe     See Function Navigator for Current Functional Status.   Therapy/Group: Individual Therapy  Cresenciano Genre, PT, DPT 01/02/2017, 2:29 PM

## 2017-01-03 ENCOUNTER — Inpatient Hospital Stay (HOSPITAL_COMMUNITY): Payer: Medicare Other

## 2017-01-03 ENCOUNTER — Inpatient Hospital Stay (HOSPITAL_COMMUNITY): Payer: Medicare Other | Admitting: Physical Therapy

## 2017-01-03 ENCOUNTER — Inpatient Hospital Stay (HOSPITAL_COMMUNITY): Payer: Medicare Other | Admitting: Occupational Therapy

## 2017-01-03 DIAGNOSIS — R030 Elevated blood-pressure reading, without diagnosis of hypertension: Secondary | ICD-10-CM

## 2017-01-03 DIAGNOSIS — L98491 Non-pressure chronic ulcer of skin of other sites limited to breakdown of skin: Secondary | ICD-10-CM

## 2017-01-03 DIAGNOSIS — I10 Essential (primary) hypertension: Secondary | ICD-10-CM

## 2017-01-03 DIAGNOSIS — I798 Other disorders of arteries, arterioles and capillaries in diseases classified elsewhere: Secondary | ICD-10-CM

## 2017-01-03 DIAGNOSIS — R443 Hallucinations, unspecified: Secondary | ICD-10-CM | POA: Insufficient documentation

## 2017-01-03 DIAGNOSIS — E1159 Type 2 diabetes mellitus with other circulatory complications: Secondary | ICD-10-CM

## 2017-01-03 DIAGNOSIS — L97509 Non-pressure chronic ulcer of other part of unspecified foot with unspecified severity: Secondary | ICD-10-CM

## 2017-01-03 DIAGNOSIS — L97519 Non-pressure chronic ulcer of other part of right foot with unspecified severity: Secondary | ICD-10-CM

## 2017-01-03 DIAGNOSIS — E1149 Type 2 diabetes mellitus with other diabetic neurological complication: Secondary | ICD-10-CM

## 2017-01-03 DIAGNOSIS — R0989 Other specified symptoms and signs involving the circulatory and respiratory systems: Secondary | ICD-10-CM

## 2017-01-03 LAB — RENAL FUNCTION PANEL
ALBUMIN: 1.6 g/dL — AB (ref 3.5–5.0)
Anion gap: 9 (ref 5–15)
BUN: 49 mg/dL — AB (ref 6–20)
CALCIUM: 8.6 mg/dL — AB (ref 8.9–10.3)
CO2: 26 mmol/L (ref 22–32)
CREATININE: 6.76 mg/dL — AB (ref 0.61–1.24)
Chloride: 95 mmol/L — ABNORMAL LOW (ref 101–111)
GFR calc Af Amer: 8 mL/min — ABNORMAL LOW (ref >60)
GFR calc non Af Amer: 7 mL/min — ABNORMAL LOW (ref >60)
GLUCOSE: 78 mg/dL (ref 65–99)
PHOSPHORUS: 3.7 mg/dL (ref 2.5–4.6)
Potassium: 4.6 mmol/L (ref 3.5–5.1)
SODIUM: 130 mmol/L — AB (ref 135–145)

## 2017-01-03 LAB — GLUCOSE, CAPILLARY
GLUCOSE-CAPILLARY: 248 mg/dL — AB (ref 65–99)
Glucose-Capillary: 225 mg/dL — ABNORMAL HIGH (ref 65–99)
Glucose-Capillary: 111 mg/dL — ABNORMAL HIGH (ref 65–99)
Glucose-Capillary: 108 mg/dL — ABNORMAL HIGH (ref 65–99)

## 2017-01-03 LAB — URINE CULTURE: CULTURE: NO GROWTH

## 2017-01-03 MED ORDER — DELFLEX-LC/2.5% DEXTROSE 394 MOSM/L IP SOLN: INTRAPERITONEAL | Status: DC

## 2017-01-03 MED ORDER — DEXTROSE 5 % IV SOLN
1.0000 g | Freq: Once | INTRAVENOUS | Status: AC
  Administered 2017-01-03: 1 g via INTRAVENOUS
  Filled 2017-01-03: qty 1

## 2017-01-03 MED ORDER — DEXTROSE 5 % IV SOLN
500.0000 mg | INTRAVENOUS | Status: DC
  Administered 2017-01-04 – 2017-01-05 (×2): 500 mg via INTRAVENOUS
  Filled 2017-01-03 (×3): qty 0.5

## 2017-01-03 MED ORDER — BACITRACIN-NEOMYCIN-POLYMYXIN OINTMENT TUBE
TOPICAL_OINTMENT | Freq: Every day | CUTANEOUS | Status: DC
  Administered 2017-01-03 – 2017-01-06 (×4): via TOPICAL
  Filled 2017-01-03: qty 14.17

## 2017-01-03 MED ORDER — INSULIN NPH (HUMAN) (ISOPHANE) 100 UNIT/ML ~~LOC~~ SUSP
20.0000 [IU] | Freq: Once | SUBCUTANEOUS | Status: AC
  Administered 2017-01-03: 20 [IU] via SUBCUTANEOUS

## 2017-01-03 MED ORDER — HEPARIN 1000 UNIT/ML FOR PERITONEAL DIALYSIS
INTRAPERITONEAL | Status: DC | PRN
  Filled 2017-01-03: qty 5000

## 2017-01-03 MED ORDER — CARVEDILOL 12.5 MG PO TABS: 12.5000 mg | ORAL_TABLET | Freq: Every day | ORAL | Status: DC

## 2017-01-03 MED ORDER — NITROGLYCERIN 0.2 MG/HR TD PT24
0.2000 mg | MEDICATED_PATCH | Freq: Every day | TRANSDERMAL | Status: DC
  Administered 2017-01-03 – 2017-01-06 (×4): 0.2 mg via TRANSDERMAL
  Filled 2017-01-03 (×4): qty 1

## 2017-01-03 MED ORDER — CARVEDILOL 12.5 MG PO TABS
12.5000 mg | ORAL_TABLET | Freq: Every day | ORAL | Status: DC
  Administered 2017-01-03 – 2017-01-05 (×3): 12.5 mg via ORAL
  Filled 2017-01-03 (×3): qty 1

## 2017-01-03 MED ORDER — HEPARIN 1000 UNIT/ML FOR PERITONEAL DIALYSIS: 500.0000 [IU] | INTRAMUSCULAR | Status: DC | PRN

## 2017-01-03 MED ORDER — PRO-STAT SUGAR FREE PO LIQD
30.0000 mL | Freq: Two times a day (BID) | ORAL | Status: DC
  Administered 2017-01-03 – 2017-01-06 (×7): 30 mL via ORAL
  Filled 2017-01-03 (×6): qty 30

## 2017-01-03 MED ORDER — GENTAMICIN SULFATE 0.1 % EX CREA: 1.0000 "application " | TOPICAL_CREAM | Freq: Every day | CUTANEOUS | Status: DC

## 2017-01-03 MED ORDER — INSULIN NPH (HUMAN) (ISOPHANE) 100 UNIT/ML ~~LOC~~ SUSP
40.0000 [IU] | Freq: Every day | SUBCUTANEOUS | Status: DC
  Administered 2017-01-04 – 2017-01-06 (×3): 40 [IU] via SUBCUTANEOUS

## 2017-01-03 MED ORDER — TRAMADOL HCL 50 MG PO TABS
25.0000 mg | ORAL_TABLET | Freq: Four times a day (QID) | ORAL | Status: DC | PRN
  Administered 2017-01-03: 25 mg via ORAL
  Filled 2017-01-03 (×2): qty 1

## 2017-01-03 NOTE — Progress Notes (Signed)
Physical Therapy Note  Patient Details  Name: [name] MRN: [id number] Date of Birth: 26-Dec-1940 Today's Date: 01/03/2017    Pt's plan of care adjusted to 15/7 after speaking with care team and discussed with the PA as pt is currently unable to tolerate current therapy schedule with OT and PT secondary to fatigue, poor activity tolerance and medical status regarding low blood pressure.   Cresenciano Genre, PT, DPT 01/03/2017, 3:26 PM

## 2017-01-03 NOTE — Progress Notes (Addendum)
[place] PHYSICAL MEDICINE & REHABILITATION     PROGRESS NOTE  Subjective/Complaints:  Pt seen sitting at the EOB, eating breakfast this AM.  He slept fairly overnight.  He states he was seen by Dr. Lajoyce Corners this AM.  His jerking has improved, he no longer has hallucinations.  Wife with concerns about lesions on ears.   ROS: Denies nausea, vomiting, diarrhea, shortness of breath or chest pain   Objective: Vital Signs: Blood pressure (!) 149/64, pulse 69, temperature 98.9 F (37.2 C), temperature source Axillary, resp. rate 18, height  (1.753 m), weight 90.4 kg (199 lb 4.7 oz), SpO2 90 %. Ct Head Wo Contrast  Result Date: 01/02/2017 CLINICAL DATA:  Ataxia, difficulty standing, hallucinations, altered mental status. EXAM: CT HEAD WITHOUT CONTRAST TECHNIQUE: Contiguous axial images were obtained from the base of the skull through the vertex without intravenous contrast. COMPARISON:  None. FINDINGS: Brain: No evidence of an acute infarct, acute hemorrhage, mass lesion, mass effect or hydrocephalus. Atrophy and periventricular low attenuation. Vascular: No hyperdense vessel or unexpected calcification. Skull: Normal. Negative for fracture or focal lesion. Sinuses/Orbits: No acute finding. Other: None. IMPRESSION: 1. No acute intracranial abnormality. 2. Atrophy and chronic microvascular white matter ischemic changes. Electronically Signed   By: Leanna Battles M.D.   On: 01/02/2017 16:34    Recent Labs  01/02/17 0756  WBC 11.5*  HGB 9.5*  HCT 29.5*  PLT 428*    Recent Labs  01/01/17 0538 01/02/17 0756  NA 127* 127*  K 4.8 5.0  CL 90* 90*  GLUCOSE 258* 383*  BUN 45* 47*  CREATININE 7.00* 7.11*  CALCIUM 8.0* 8.3*   CBG (last 3)   Recent Labs  01/02/17 1641 01/02/17 2106 01/03/17 0627  GLUCAP 246* 333* 225*    Wt Readings from Last 3 Encounters:  01/03/17 90.4 kg (199 lb 4.7 oz)  12/27/16 89.2 kg (196 lb 10.4 oz)  12/14/16 92.1 kg (203 lb)    Physical Exam:  BP (!)  149/64 (BP Location: Right Arm)   Pulse 69   Temp 98.9 F (37.2 C) (Axillary)   Resp 18   Ht  (1.753 m)   Wt 90.4 kg (199 lb 4.7 oz)   SpO2 90%   BMI 29.43 kg/m  Constitutional: He appears well-developedand well-nourished. No distress.  HENT: Normocephalicand atraumatic.  Eyes: EOMare normal. No discharge. Cardiovascular: RRR with murmur. No JVD. Respiratory: CTA Bilaterally. Normal effort  GI: Bowel sounds are normal. He exhibits no distension.  Musculoskeletal: He exhibits tenderness. He exhibits mild edema.  Neurological: He is alertand oriented.  Motor: B/l UE 5/5 prox to distal.  LLE 4+/5 HF, KE, 4/5 ADF/PF (stable, pain inhibition).  RLE: 4+/5  HF, KE and ADF/PF.  Skin: Skin is warmand dry. He is not diaphoretic.  Left TMA with dressing c/d/i.  Right groin wound packed  RLE 5th digit with ulcer Pressure ulcers to b/l ears and nose PD sites dressed Psychiatric: He has a normal mood and affect. His behavior is normal. Judgmentand thought contentnormal.    Assessment/Plan: 1. Functional deficits secondary to left TMA which require 3+ hours per day of interdisciplinary therapy in a comprehensive inpatient rehab setting. Physiatrist is providing close team supervision and 24 hour management of active medical problems listed below. Physiatrist and rehab team continue to assess barriers to discharge/monitor patient progress toward functional and medical goals.  Function:  Bathing Bathing position   Position: Wheelchair/chair at sink  Bathing parts Body parts bathed by patient:  Left arm, Right arm, Chest, Abdomen, Front perineal area, Right upper leg, Left upper leg Body parts bathed by helper: Buttocks, Right lower leg, Left lower leg, Back  Bathing assist Assist Level: Touching or steadying assistance(Pt > 75%)      Upper Body Dressing/Undressing Upper body dressing   What is the patient wearing?: Pull over shirt/dress     Pull over shirt/dress -  Perfomed by patient: Thread/unthread right sleeve, Thread/unthread left sleeve Pull over shirt/dress - Perfomed by helper: Put head through opening, Pull shirt over trunk        Upper body assist Assist Level: Touching or steadying assistance(Pt > 75%)      Lower Body Dressing/Undressing Lower body dressing   What is the patient wearing?: Underwear, Pants, Socks, Shoes Underwear - Performed by patient: Thread/unthread right underwear leg, Thread/unthread left underwear leg, Pull underwear up/down Underwear - Performed by helper: Thread/unthread right underwear leg, Thread/unthread left underwear leg, Pull underwear up/down Pants- Performed by patient: Thread/unthread right pants leg, Thread/unthread left pants leg, Pull pants up/down Pants- Performed by helper: Thread/unthread right pants leg, Thread/unthread left pants leg, Pull pants up/down Non-skid slipper socks- Performed by patient: Don/doff right sock Non-skid slipper socks- Performed by helper: Don/doff right sock (n/a left sock) Socks - Performed by patient: Don/doff right sock Socks - Performed by helper: Don/doff right sock Shoes - Performed by patient: Don/doff right shoe Shoes - Performed by helper: Don/doff right shoe          Lower body assist Assist for lower body dressing: Touching or steadying assistance (Pt > 75%)      Toileting Toileting Toileting activity did not occur: N/A (pt oliguric, dialysis pt)   Toileting steps completed by helper: Adjust clothing prior to toileting, Performs perineal hygiene, Adjust clothing after toileting    Toileting assist Assist level: Two helpers (2 helpers initially, then total assist +1)   Transfers Chair/bed transfer   Chair/bed transfer method: Squat pivot, Lateral scoot Chair/bed transfer assist level: Maximal assist (Pt 25 - 49%/lift and lower) Chair/bed transfer assistive device: Armrests, Sliding board     Locomotion Ambulation Ambulation activity did not occur:  Safety/medical concerns   Max distance: 5' Assist level: Touching or steadying assistance (Pt > 75%)   Wheelchair   Type: Manual Max wheelchair distance: 150 feet Assist Level: Supervision or verbal cues  Cognition Comprehension Comprehension assist level: Understands basic 90% of the time/cues < 10% of the time  Expression Expression assist level: Expresses basic needs/ideas: With extra time/assistive device  Social Interaction Social Interaction assist level: Interacts appropriately 75 - 89% of the time - Needs redirection for appropriate language or to initiate interaction.  Problem Solving Problem solving assist level: Solves basic 75 - 89% of the time/requires cueing 10 - 24% of the time  Memory Memory assist level: Recognizes or recalls 75 - 89% of the time/requires cueing 10 - 24% of the time    Medical Problem List and Plan: 1. Functional and mobility deficits secondary to PAD requiring left TMA, right groin wound, multiple medical  Cont CIR  CT head reviewed 9/24, unremarkable for acute changes 2. DVT Prophylaxis/Anticoagulation: Pharmaceutical: Heparin 3. Pain Management: continue oxycodone prn.   Controlled at present 4. Mood: team to provide ego support. LCSW to follow for evaluation and support.  5. Neuropsych: This patient iscapable of making decisions on hisown behalf. 6. Skin/Wound Care: dry dressing left foot  Monitor right groin wound continue dry dressing to groin  Nitroglycerin added by Dr.  Duda  Vascular eval pending for RLE -?source of infection  B/l ears, nose pressure ulcer, likely from CPAP - will pad areas 7. Fluids/Electrolytes/Nutrition: Monitor I/O. PD in evenings after therapy. 8. T2DM:   Used NPH at nights with novolog at home, lantus d/ced Novolog 15 TID started 9/20, d/ced 9/24.   Monitor BS ac/hs.   Continue SSI for elevated BS.   Home NPH dose is 70U qhs,  qHS NPH 40u on 9/22  Started on 20 units AM on 9/24, increased to 40 U on  9/25 9. Constipation: Increased colace to home regimen with sorbitol daily prn.  10. OSA: has been compliant with BIPAP at nights.  11. Leucocytosis:   Afebrile  WBCs 11.5 on 9/24  Cont to monitor 12. Anemia of chronic disease: Continue to monitor.   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 History of CHF: Monitor weight daily--fluid mangedwith PD. On ASA, lipitor, coreg and lasix bid.  (cozaar and lasix on hold for now)   [facility]   01/02/17 0553 01/02/17 1655 01/03/17 0640  Weight: 93.4 kg (205 lb 12.8 oz) 92 kg (202 lb 13.2 oz) 90.4 kg (199 lb 4.7 oz)   15. ESRD: On PD daily at nights. Tolerating it well at present  Will discuss home PD schedule and if any benefits of returning to home schedule 16.  Constipation- senna S 2 po BID, dulcolax this pm 17. Light-headedness/altered balance  -?medication +/- volume related  -lasix and cozaar on hold for now  -encourage fluids 18. HTN  Cozaar on hold  Slightly labile, overall controlled  19. Hallucinations  Improving  Ucx pending  LOS (Days) 7 A FACE TO FACE EVALUATION WAS PERFORMED  Jaye Saal Karis Juba 01/03/2017 8:06 AM

## 2017-01-03 NOTE — Progress Notes (Signed)
Occupational Therapy Session Note  Patient Details  Name: [name] MRN: [id number] Date of Birth: [birth year]  Today's Date: 01/03/2017 OT Individual Time: 1100-1119 OT Individual Time Calculation (min): 19 min    Short Term Goals: Week 1:  OT Short Term Goal 1 (Week 1): Pt will transfer to toilet with min A  OT Short Term Goal 2 (Week 1): Pt will perform sit to stand with min A in prep for clothing management while maintaining weight bearing precautions OT Short Term Goal 3 (Week 1): Pt wil perform bed mobilty without bed rails with supervision/ extra time OT Short Term Goal 4 (Week 1): Pt will navigate w/c around room to obtain clothing for ADL with min A  OT Short Term Goal 5 (Week 1): Pt will perform 3/3 grooming tasks with setup  Skilled Therapeutic Interventions/Progress Updates:    Pt seated in w/c in gym upon OT arrival and was agreeable to participating in self-care tasks.  Pt began propelling self to room, Max VCing for w/c propulsion techniques and pt was unable to implement suggestions.  OT propelled pt back to room and assessed vitals.  SAO2 initially 86%, quickly improved to 95% on RA with cues for pursed lip breathing.  HR 81, BP (assessed manually by RN) 72/38.  Pt completed SB transfer back to bed with 2 person A.  Assisted pt in repositioning, unable to continue to participate in therapy per RN hold.  Therapy Documentation Precautions:  Precautions Precautions: Fall Precaution Comments: PD port Required Braces or Orthoses: Other Brace/Splint Other Brace/Splint: post op shoe Lt  Restrictions Weight Bearing Restrictions: Yes LLE Weight Bearing: Non weight bearing LLE Partial Weight Bearing Percentage or Pounds: Darco shoe General: General OT Amount of Missed Time: 41 Minutes Vital Signs: Therapy Vitals BP: (!) 72/38 (notified Pam Love, PA) Patient Position (if appropriate): Sitting Pain: Pain Assessment Pain Assessment: No/denies pain Pain Score: 0-No  pain Pain Type: Surgical pain Pain Location: Foot Pain Orientation: Left Pain Descriptors / Indicators: Aching Pain Frequency: Intermittent Pain Onset: With Activity Patients Stated Pain Goal: 3 Pain Intervention(s): Medication (See eMAR)  See Function Navigator for Current Functional Status.   Therapy/Group: Individual Therapy  Deitra Mayo 01/03/2017, 11:31 AM

## 2017-01-03 NOTE — Progress Notes (Signed)
Subjective:  Did not UF much with HD but BP low today ? Having foot pain   Objective Vital signs in last 24 hours: Vitals:   01/02/17 1655 01/03/17 0640 01/03/17 1117 01/03/17 1204  BP: (!) 124/45 (!) 149/64 (!) 72/38 (!) 105/47  Pulse: 88 69    Resp: 18     Temp: 98.9 F (37.2 C) 98.9 F (37.2 C)    TempSrc: Oral Axillary    SpO2: 98% 90%    Weight: 92 kg (202 lb 13.2 oz) 90.4 kg (199 lb 4.7 oz)    Height:       Weight change: -0.5 kg (-1 lb 1.6 oz)  Intake/Output Summary (Last 24 hours) at 01/03/17 1314 Last data filed at 01/03/17 0745  Gross per 24 hour  Intake            12734 ml  Output            13214 ml  Net             -480 ml    Dialysis Orders: [facility] [place] [facility]. 6 exchanges/day one mid-day exchange (occurs aroudn 9am). 2.5 liters of 2.5% per CCPD and 2 liters of 2.5% for last fill and mid day drain.  1. 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  S/p L transmet  12/23/16 with Dr. Duda.  2. ESRD- CCPD, using 2.5% dextrose; To facilitate CIR doing: CCPD 2.5L x 5 exchanges, <[id number] 2[place] [phone] PermanentMercy Hospital And MedicaC [phone] PenDeSh74eGuadalupe CoHealth Alliance Hospital - BurbanC ElK445-3[place] PenDeAmbula38tHosp PaviDayton Va MedicaC ElK906-8[place] PenDeSanford Health Detro11i[facility] MNortheast [facility] Surgery CeC [phone] PenDeEy52eSalem Memorial DistLoma Linda University Children'S C [phone] 6[place] PenDeChildr55eHamiVa Medical CenterC [phone] PenDe47VUc [facility] HiLLCrest - HiLLCrest MMeade District C [phone] PenDePocono 39AGreenville Community [facility] C ElK647[place] PenDeK78iPalos SuWest Oaks C [phone] PenDeHackett81sGastroenterology And Liver Disease MedicChristus Santa Rosa Hospital - WestovC [phone] PenDeNorthwest 16ASinai-GWoodlands BehavioraC ElK406-2[place] PenDeCre20sRehab Hospital At [facility] CarRiverview Hospital & C [phone] PenDeBay18hSouthern Coos Hospital & [facility] MedicaC [phone] Pe76nPine RDouglas Community HospiC [phone] WillsBerwick HospitaC [phone] P58eSt [facility] MBeloit HealtC [phone] [facility] SPremium Surgery CeC [phone] [facility] The WC ElK409-3[place] PenDeSsm Health D73aHannibal RegiPresence Saint Joseph C ElK930-6[place] P53eCrystal Clinic OrthoKindred HospitalC ElK215[place] 66PLoma Linda University Medical CeSouthern Hills Hospital And MedicaChaElson Areasinao MileWebsterume prev PD Rx. 3. Hypertension/volume- BP stable- hyponatremia argues for volume overload-  Was on all 1.5% fluid- will use 2.5% fluid- coreg decreased to at night only  4. Anemia (ABLA)- TSAT 45% and Ferritin 2284, cont Aranesp 100 qThurs- trending up  5. Constipation: miralax as needed 6. Hypokalemia: supplementing- is 5 today, will stop  7. Myoclonus / COnfusion: stop gabapentin and robaxin, limit narcotics, follow 8. Bones- cont renavite /phoslo - last phos has been ok - will check again 9. Hyperglycemia- may need more insulin   [name]    Labs: Basic Metabolic Panel:  Recent Labs Lab 12/29/16 0601  12/31/16 0602 01/01/17 0538 01/02/17 0756  NA 130*  < > 128* 127* 127*  K 4.8  < > 4.4 4.8 5.0  CL 91*  < > 91* 90* 90*  CO2 27  < > 28 28 26   GLUCOSE 466*  < > 315* 258*  383*  BUN 32*  < > 39* 45* 47*  CREATININE 7.29*  < > 7.04* 7.00* 7.11*  CALCIUM 8.1*  < > 8.1* 8.0* 8.3*  PHOS 3.9  --   --   --   --   < > = values in this interval not displayed. Liver Function Tests:  Recent Labs Lab 12/29/16 0601 01/02/17 0756  AST  --  25  ALT  --  18  ALKPHOS  --  68  BILITOT  --  0.4  PROT  --  5.8*  ALBUMIN 1.5* 1.7*   No results for input(s): LIPASE, AMYLASE in the last 168 hours. No results for input(s): AMMONIA in the last 168 hours. CBC:  Recent Labs Lab 12/29/16 0601 12/31/16 0602 01/02/17 0756  WBC 12.3* 12.2* 11.5*  NEUTROABS  --   --  8.7*  HGB 9.0* 9.2* 9.5*  HCT 28.5* 28.5* 29.5*  MCV 96.6 95.0 96.1  PLT 353 418* 428*   Cardiac Enzymes: No results for input(s): CKTOTAL, CKMB, CKMBINDEX, TROPONINI  in the last 168 hours. CBG:  Recent Labs Lab 01/02/17 1137 01/02/17 1641 01/02/17 2106 01/03/17 0627 01/03/17 1126  GLUCAP 234* 246* 333* 225* 111*    Iron Studies: No results for input(s): IRON, TIBC, TRANSFERRIN, FERRITIN in the last 72 hours. Studies/Results: Ct Head Wo Contrast  Result Date: 01/02/2017 CLINICAL DATA:  Ataxia, difficulty standing, hallucinations, altered mental status. EXAM: CT HEAD WITHOUT CONTRAST TECHNIQUE: Contiguous axial images were obtained from the base of the skull through the vertex without intravenous contrast. COMPARISON:  None. FINDINGS: Brain: No evidence of an acute infarct, acute hemorrhage, mass lesion, mass effect or hydrocephalus. Atrophy and periventricular low attenuation. Vascular: No hyperdense vessel or unexpected calcification. Skull: Normal. Negative for fracture or focal lesion. Sinuses/Orbits: No acute finding. Other: None. IMPRESSION: 1. No acute intracranial abnormality. 2. Atrophy and chronic microvascular white matter ischemic changes. Electronically Signed   By: Leanna Battles M.D.   On: 01/02/2017 16:34   Medications: Infusions: . dialysis solution 1.5% low-MG/low-CA    .  dialysis solution 2.5% low-MG/low-CA      Scheduled Medications: . aspirin EC  81 mg Oral Daily  . atorvastatin  80 mg Oral q1800  . calcium acetate  667 mg Oral TID WC  . carvedilol  12.5 mg Oral BID WC  . clopidogrel  75 mg Oral Daily  . darbepoetin (ARANESP) injection - DIALYSIS  100 mcg Subcutaneous Q Thu-1800  . feeding supplement (NEPRO CARB STEADY)  237 mL Oral TID WC  . feeding supplement (PRO-STAT SUGAR FREE 64)  30 mL Oral BID  . gentamicin cream  1 application Topical Daily  . heparin  5,000 Units Subcutaneous Q8H  . insulin aspart  0-20 Units Subcutaneous TID WC  . insulin aspart  0-5 Units Subcutaneous QHS  . insulin NPH Human  40 Units Subcutaneous QHS  . [START ON 01/04/2017] insulin NPH Human  40 Units Subcutaneous QAC breakfast  . isosorbide mononitrate  30 mg Oral Daily  . mouth rinse  15 mL Mouth Rinse BID  . multivitamin  1 tablet Oral QHS  . nitroGLYCERIN  0.2 mg Transdermal Daily  . pantoprazole  40 mg Oral Daily  . senna-docusate  2 tablet Oral BID  . [START ON 01/08/2017] Vitamin D (Ergocalciferol)  50,000 Units Oral Q30 days    have reviewed scheduled and prn medications.  Physical Exam: General: in bed- having foot pain - just got done with therapy Heart: RRR Lungs: mostly clear Abdomen: obese, soft, non tender Extremities: pitting edema Dialysis Access: PD cath     01/03/2017,1:14 PM  LOS: 7 days

## 2017-01-03 NOTE — Progress Notes (Signed)
Physical Therapy Note  Patient Details  Name: [name] MRN: [id number] Date of Birth: 05/10/1940 Today's Date: 01/03/2017    Attempted to see pt at 1345 for scheduled therapy make up session.  Pt c/o 14/10 pain at rest, discussed with pt and wife who report pt unable to take more due to inability to "clear medication from system."  Pt declining to participate in therapy due to pain and fatigue.  Left resting in bed with call bell in reach and needs met.   Caitlin E Warren, PT, DPT 01/03/2017, 4:19 PM  

## 2017-01-03 NOTE — Plan of Care (Signed)
Problem: RH SKIN INTEGRITY Goal: RH STG MAINTAIN SKIN INTEGRITY WITH ASSISTANCE STG Maintain Skin Integrity With mod Assistance.  Outcome: Progressing  01/03/17 0446  Skin Integrity Goals  STG: Maintain skin integrity with assistance 5-Supervision/cueing

## 2017-01-03 NOTE — Progress Notes (Signed)
Patient ID: [name], male   DOB: [birth year], 76 y.o.   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 Orders are written to start using a nitroglycerin patch. Continue nonweightbearing on the left lower extremity. Patient has an ischemic ulcer on the right fifth toe. Vascular surgery evaluating for vascular options.

## 2017-01-03 NOTE — Progress Notes (Signed)
Called by Primary about patient having temp 100.7 and HR lowe 100s.  I asked the Primary RN to call the INPT Rehab provider on call and update them on the patient and situation and that I would come as soon as I could.   RN did call provider, received orders for CXR, blood cultures, antibiotics.   Upon my arrival at 2210, patient was awake on home BIPAP, I asked him how he felt, he said he felt fine.  Lung sounds clear in the uppers, diminished in lowers, skin is normal temp/color, + pulses.  Patient's RN came in to give scheduled Coreg, which should help maintain BP and mild tachycardia, when she took after the patient's mask, he did seem more irritable and slightly agitated, per his wife at the bedside, this isnt baseline for huim,  Patient did have recent orthopedic surgery on LLE, received tramadol previously ( ? Jerking/tremors/hallucinations from this).  Patient was oriented to self and place but not time nor situation, no focal neuro deficits besides the jerking movements which are not new.  I did speak with provider on call, we discussed the patient. Repeat VS stable, temp 100.5, blood sugar okay.   Plan: - D/C Tramadol - Blood cultures - Antibiotics - CXR - PRN APAP Q4H for temp > 100  (per wife, patient does have some AMS when febrile) - external cooling (cold washcloths, icepacks, as patient tolerates).  Will follow as needed  Call Time 2157 End Time 2245

## 2017-01-03 NOTE — Progress Notes (Signed)
Recreational Therapy Session Note  Patient Details  Name: [name] MRN: [id number] Date of Birth: [birth year] Today's Date: 01/03/2017   TR eval deferred as pt with low activity tolerance requiring reduction in therapy schedule.  Will offer animal assisted therapy/activities PRN. Amarea Macdowell 01/03/2017, 3:42 PM

## 2017-01-03 NOTE — Progress Notes (Signed)
Physical Therapy Session Note  Patient Details  Name: NEALE MARZETTE MRN: [id number] Date of Birth: Jun 14, 1940  Today's Date: 01/03/2017 PT Individual Time: 1300-1325 PT Individual Time Calculation (min): 25 min   Short Term Goals: Week 1:  PT Short Term Goal 1 (Week 1): Pt to be mod I with supine<>sitting. PT Short Term Goal 2 (Week 1): Sit<>stand to with min assist and LRAD PT Short Term Goal 3 (Week 1): Pt to ambulate 5 ft with LRAD PT Short Term Goal 4 (Week 1): Up/down 1 step with mod assist.   Skilled Therapeutic Interventions/Progress Updates: Pt presented in bed with c/o of increased R residual foot pain 7/10 with RN recently providing pain meds. BP checked due to dropping this am 124/48 in supine. Session focused on repositioning for comfort and bed mobility. Pt able to perform SL bridge with BUE to boost self higher in bed. Able to perform additional x5 bridges for repositioning of pad and strengthening. Performed rolling to R min guard with pillow between legs for RLE support for pressure relief off buttock. Instructed pt and wife on pillow placement in sidelying for comfort. Pt request to return to supine after approx 2 min, performing with min guard. Pt left in supine with pillows stacked to comfort, call bell within reach and wife present.      Therapy Documentation Precautions:  Precautions Precautions: Fall Precaution Comments: PD port Required Braces or Orthoses: Other Brace/Splint Other Brace/Splint: post op shoe Lt  Restrictions Weight Bearing Restrictions: Yes LLE Weight Bearing: Non weight bearing LLE Partial Weight Bearing Percentage or Pounds: Darco shoe General: PT Amount of Missed Time (min): 35 Minutes PT Missed Treatment Reason: Patient fatigue (Low blood pressure) Vital Signs: Therapy Vitals Temp: 97.9 F (36.6 C) Temp Source: Oral Pulse Rate: 89 Resp: 17 BP: (!) 118/43 Patient Position (if appropriate): Lying Oxygen Therapy SpO2: 98 % O2  Device: Not Delivered  See Function Navigator for Current Functional Status.   Therapy/Group: Individual Therapy  Rizwan Kuyper  Ibn Stief, PTA  01/03/2017, 4:22 PM

## 2017-01-03 NOTE — Progress Notes (Signed)
Physical Therapy Session Note  Patient Details  Name: [name] MRN: [id number] Date of Birth: [birth year]  Today's Date: 01/03/2017 PT Individual Time: 1000-1100, 1500-1510 PT Individual Time Calculation (min): 60 min, 10 min Pt missed 35 minutes of skilled physical therapy this session secondary to low BP, fatigue and poor activity tolerance.   Short Term Goals: Week 1:  PT Short Term Goal 1 (Week 1): Pt to be mod I with supine<>sitting. PT Short Term Goal 2 (Week 1): Sit<>stand to with min assist and LRAD PT Short Term Goal 3 (Week 1): Pt to ambulate 5 ft with LRAD PT Short Term Goal 4 (Week 1): Up/down 1 step with mod assist.   Skilled Therapeutic Interventions/Progress Updates:    Session 1: Pt supine in bed upon PT arrival, agreeable to therapy tx and denies pain. Pt reports feeling much better today. Pt transferred from supine to sitting EOB with min assist. Pt performed slideboard transfer from bed<>chair with mod assist, max verbal cues for anterior weightshift, holding pts L LE in order to maintain NWB precautions. Pt propelled x 50 ft to the gym with supervision and verbal cues for technique before needing a rest break. Session focused on slideboard transfers (as PA/RN requested to hold stand pivot transfers secondary to wound healing and poor ability to follow NWB precuations). Pt performed x 3 transfers each way from chair<>mat with mod assist fading to min assist and PT holding L LE to maintain precautions. Pt reported he has difficulty bringing his trunk forward due to LBP, 6/10 (already received pain meds). Pt left seated in w/c and handed off to OT for tx.  Session 2:  Pt supine in bed upon PT arrival, pt lightly asleep but easily aroused. agreeable to bed level exercises secondary to medical status and fatigue. Pt performed 1 x 10 SLR with R LE and 1 x 8 active assisted SLR with L LE before reporting pain. Pt performed 1 x 10 heel slides with R LE. While performing bed  level exercises, vitals monitored to assess activity tolerance: BP 94/40, HR 86 and SpO2 90%. RN and PT decided to continue to hold therapy due to low blood pressure and inability to tolerate activities.  Pt missed 35 minutes of skilled physical therapy this session secondary to low BP, fatigue and poor activity tolerance.   Therapy Documentation Precautions:  Precautions Precautions: Fall Precaution Comments: PD port Required Braces or Orthoses: Other Brace/Splint Other Brace/Splint: post op shoe Lt  Restrictions Weight Bearing Restrictions: Yes LLE Weight Bearing: Non weight bearing LLE Partial Weight Bearing Percentage or Pounds: Darco shoe   See Function Navigator for Current Functional Status.   Therapy/Group: Individual Therapy  Cresenciano Genre, PT, DPT 01/03/2017, 11:58 AM

## 2017-01-03 NOTE — Consult Note (Signed)
WOC Nurse wound consult note Reason for Consult: Consult requested for nose and right ear.  Pt has been wearing Bipap and has developed medical-device related pressure injuries. Wound type: Bridge of norse 1X1X.1cm stage 2 pressure injury, pink and moist, scant amt yellow drainage Right upper ear with dark red deep tissue injury; .2X.2cm Pressure Injury POA: No Dressing procedure/placement/frequency: Discussed plan of care with patient and wife at the bedside.  Neosporin and open to air during the day to promote healing to bridge of nose.  Foam dressing to protect and add padding to ear and nose when patient is wearing Bipap mask at night.  Instructions provided for staff nurses and wife. Please re-consult if further assistance is needed.  Thank-you,  Cammie Mcgee MSN, RN, CWOCN, [place], CNS [phone]

## 2017-01-03 NOTE — Progress Notes (Signed)
Pharmacy Antibiotic Note  [name] is a 76 y.o. male admitted on 12/27/2016 with fever.  Pharmacy has been consulted for vancomycin dosing.  Patient currently receiving peritoneal dialysis.  Vancomycin to be given IV.  Plan: Vancomycin 2000 mg IV x 1 now Will recheck vancomycin level in 3-4 days to assess when next dose needed. F/u cultures, clinical course.  Height:  (175.3 cm) Weight: 201 lb 8 oz (91.4 kg) IBW/kg (Calculated) : 70.7  Temp (24hrs), Avg:98.9 F (37.2 C), Min:97.9 F (36.6 C), Max:100.7 F (38.2 C)   Recent Labs Lab 12/29/16 0601 12/30/16 0816 12/31/16 0602 01/01/17 0538 01/02/17 0756 01/03/17 1223  WBC 12.3*  --  12.2*  --  11.5*  --   CREATININE 7.29* 7.40* 7.04* 7.00* 7.11* 6.76*    Estimated Creatinine Clearance: 10.6 mL/min (A) (by C-G formula based on SCr of 6.76 mg/dL (H)).    Allergies  Allergen Reactions  . Lisinopril Hives  . Methotrexate Derivatives Other (See Comments)    blisters  . Sulfa Antibiotics Hives  . Tape Hives  . Vancomycin Other (See Comments)    Blisters     Antimicrobials this admission:  Dose adjustments this admission:  Microbiology results:  BCx:  9/24 UCx: neg   Sputum:    MRSA PCR:   Thank you for allowing pharmacy to be a part of this patient's care.  Tad Moore, BCPS  Clinical Pharmacist Pager [phone]  01/03/2017 10:07 PM

## 2017-01-04 ENCOUNTER — Inpatient Hospital Stay (HOSPITAL_COMMUNITY): Payer: Medicare Other

## 2017-01-04 ENCOUNTER — Inpatient Hospital Stay (HOSPITAL_COMMUNITY): Payer: Medicare Other | Admitting: Occupational Therapy

## 2017-01-04 LAB — CBC
HCT: 29 % — ABNORMAL LOW (ref 39.0–52.0)
HEMOGLOBIN: 9.4 g/dL — AB (ref 13.0–17.0)
MCH: 30.9 pg (ref 26.0–34.0)
MCHC: 32.4 g/dL (ref 30.0–36.0)
MCV: 95.4 fL (ref 78.0–100.0)
Platelets: 477 10*3/uL — ABNORMAL HIGH (ref 150–400)
RBC: 3.04 MIL/uL — ABNORMAL LOW (ref 4.22–5.81)
RDW: 14.9 % (ref 11.5–15.5)
WBC: 10.2 10*3/uL (ref 4.0–10.5)

## 2017-01-04 LAB — RENAL FUNCTION PANEL
ANION GAP: 11 (ref 5–15)
Albumin: 1.6 g/dL — ABNORMAL LOW (ref 3.5–5.0)
BUN: 45 mg/dL — ABNORMAL HIGH (ref 6–20)
CALCIUM: 8.4 mg/dL — AB (ref 8.9–10.3)
CO2: 28 mmol/L (ref 22–32)
CREATININE: 6.4 mg/dL — AB (ref 0.61–1.24)
Chloride: 91 mmol/L — ABNORMAL LOW (ref 101–111)
GFR calc Af Amer: 9 mL/min — ABNORMAL LOW (ref >60)
GFR calc non Af Amer: 8 mL/min — ABNORMAL LOW (ref >60)
GLUCOSE: 400 mg/dL — AB (ref 65–99)
Phosphorus: 4.3 mg/dL (ref 2.5–4.6)
Potassium: 4.5 mmol/L (ref 3.5–5.1)
SODIUM: 130 mmol/L — AB (ref 135–145)

## 2017-01-04 LAB — GLUCOSE, CAPILLARY
GLUCOSE-CAPILLARY: 395 mg/dL — AB (ref 65–99)
GLUCOSE-CAPILLARY: 155 mg/dL — AB (ref 65–99)
Glucose-Capillary: 205 mg/dL — ABNORMAL HIGH (ref 65–99)
Glucose-Capillary: 246 mg/dL — ABNORMAL HIGH (ref 65–99)

## 2017-01-04 MED ORDER — DELFLEX-LC/1.5% DEXTROSE 344 MOSM/L IP SOLN: Freq: Every day | INTRAPERITONEAL | Status: DC

## 2017-01-04 MED ORDER — HEPARIN 1000 UNIT/ML FOR PERITONEAL DIALYSIS: 500.0000 [IU] | INTRAMUSCULAR | Status: DC | PRN

## 2017-01-04 MED ORDER — HEPARIN 1000 UNIT/ML FOR PERITONEAL DIALYSIS: INTRAPERITONEAL | Status: DC | PRN

## 2017-01-04 MED ORDER — DELFLEX-LC/2.5% DEXTROSE 394 MOSM/L IP SOLN: Freq: Every day | INTRAPERITONEAL | Status: DC

## 2017-01-04 NOTE — Progress Notes (Signed)
Physical Therapy Session Note  Patient Details  Name: [name] MRN: [id number] Date of Birth: July 30, 1940  Today's Date: 01/04/2017 PT Individual Time: 1100-1157, 1620-1650 Total time: 57 min and 30 min     Short Term Goals: Week 1:  PT Short Term Goal 1 (Week 1): Pt to be mod I with supine<>sitting. PT Short Term Goal 2 (Week 1): Sit<>stand to with min assist and LRAD PT Short Term Goal 3 (Week 1): Pt to ambulate 5 ft with LRAD PT Short Term Goal 4 (Week 1): Up/down 1 step with mod assist.   Skilled Therapeutic Interventions/Progress Updates:    Session 1: Pt sitting in w/c upon PT arrival, RN finishing wrapping limb. Pt propelled w/c part way to the gym x 50 ft with multiple rest breaks and with increased time to complete, educated on longer strides but pt said it was harder. Pt performed slideboard transfer x 2 from w/c<>mat with min assist while therapist held L LE to maintain NWB precuations. Vitals monitored after transfers to assess activity tolerance: SpO2 98%, HR 110, Bp 100/43 after first transfer, and SpO2 98%, HR 106, BP 90/40 after second transfer- RN and PA notified Pt seated in w/c performed therex: 2 x 10 LAQ with each LE, 2 x10 seated marches.   Pt reports pain 8/10 in distal L LE this session, RN notified, elevated LE for some relief.  Pt transferred from w/c>recliner using slideboard, lateral scoot with min assist.   Session 2: Pt supine in bed upon PT arrival, agreeable to therapy tx and reports pain 6/10 in distal L LE. Pt transferred from supine to sitting EOB with min assist, verbal cues for technique and increased time to complete. Pt completed slideboard transfer x 2 from bed<>w/c with min assist and therapist hold L LE to maintain NWB precautions. Pt reported feeling very fatigued still and in a lot of pain. Pt transferred sitting>supine with min assist, and scooted up in bed with supervision. Pt left supine in bed with needs in reach and wife present.    Therapy Documentation Precautions:  Precautions Precautions: Fall Precaution Comments: PD port Required Braces or Orthoses: Other Brace/Splint Other Brace/Splint: post op shoe Lt  Restrictions Weight Bearing Restrictions: Yes LLE Weight Bearing: Non weight bearing LLE Partial Weight Bearing Percentage or Pounds: Darco shoe   See Function Navigator for Current Functional Status.   Therapy/Group: Individual Therapy  Cresenciano Genre, PT, DPT 01/04/2017, 11:26 AM

## 2017-01-04 NOTE — Patient Care Conference (Signed)
Inpatient RehabilitationTeam Conference and Plan of Care Update Date: 01/04/2017   Time: 8:20 AM    Patient Name: [name]      Medical Record Number: [id number]  Date of Birth: [birth year] Sex: Male         Room/Bed: 4M08C/4M08C-01 Payor Info: Payor: MEDICARE / Plan: MEDICARE PART A AND B / Product Type: *No Product type* /    Admitting Diagnosis: L TMA  Admit Date/Time:  12/27/2016  4:38 PM Admission Comments: No comment available   Primary Diagnosis:  Debility Principal Problem: Debility  Patient Active Problem List   Diagnosis Date Noted  . Labile blood pressure   . Hallucination   . Benign essential HTN   . Ulcer of toe of right foot (HCC)   . Ulcer of external ear, limited to breakdown of skin (HCC)   . Chronic congestive heart failure (HCC)   . Anemia of chronic disease   . Labile blood glucose   . 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 Diabetic peripheral neuropathy (HCC)   . Debility 12/27/2016  . History of transmetatarsal amputation of left foot (HCC) 12/27/2016  . Constipation   . Chronic obstructive pulmonary disease (HCC)   . Nausea   . ESRD on dialysis (HCC)   . Leukocytosis   . Tachypnea   . FUO (fever of unknown origin)   . Encephalopathy 12/23/2016  . Sore throat 12/23/2016  . Gangrene of left foot (HCC) 11/26/2016  . PAD (peripheral artery disease) (HCC) 11/26/2016  . Sjogren's syndrome (HCC) 09/26/2015  . Uncontrolled type 2 diabetes mellitus with hyperglycemia, with long-term current use of insulin (HCC) 09/26/2015  . Hyperosmolar non-ketotic state in patient with type 2 diabetes mellitus (HCC) 09/25/2015  . Ileus (HCC) 09/22/2015  . Acute respiratory failure with hypoxia (HCC) 09/22/2015  . Acute gouty arthritis 09/22/2015  . CAD (coronary artery disease) of artery bypass graft 09/22/2015  . Sepsis (HCC) 09/21/2015  . Elevated troponin level 09/21/2015  . ESRD on peritoneal dialysis (HCC) 09/21/2015   . CVA (cerebral infarction) 09/21/2015    Expected Discharge Date: Expected Discharge Date: 01/13/17  Team Members Present: Physician leading conference: Dr. Maryla Morrow Social Worker Present: Dossie Der, LCSW Nurse Present: Allayne Stack, RN PT Present: Woodfin Ganja, PT OT Present: Perrin Maltese, OT     Current Status/Progress Goal Weekly Team Focus  Medical   Functional and mobility deficits  secondary to PAD requiring left TMA, right groin wound, multiple medical  Improve mobility, transferes, safety, DM, wounds, infection  See above   Bowel/Bladder   Continent of bowel and bladder; LBM 9/25; PD nightly  Mod I assist  Assess and treat for constipation as needed   Swallow/Nutrition/ Hydration   Poor appetite         ADL's   mod assist for LB selfcare, supervision UB selfcare, mod assist transfers stand pivot with use of the RW.  supervision to min assist  standing balance, transfer training, selfcare retraining, strength endurance, strength training, pt/family education   Mobility   min to mod assist for slideboard transfers, max assist for sit<>stand and gait  supervision-Mod I   adherence to precautions, slideboard transfers, w/c propulsion   Communication             Safety/Cognition/ Behavioral Observations            Pain   C/o pain in left foot intermittently, tylenol and tramadol used only; patient cannot tolerate strong  meds  < 4  Assess and treat for pain q shift and prn   Skin   Incision to left foot with minimal serosanguinous drainage, area of purple skin on medial part of incision; stage II to nasal bridge; DTI to right ear; MASD to sacrum (MCG powder), right groin wound dressing changes BID; wound to right 5th toe with aquacel dressings BID  Mod assist  Assess skin q shift and prn; perform dressing changes per MD order      *See Care Plan and progress notes for long and short-term goals.     Barriers to Discharge  Current Status/Progress Possible  Resolutions Date Resolved   Physician    Medical stability;Decreased caregiver support;Wound Care;Lack of/limited family support;Other (comments);IV antibiotics     See above  Therapies, optimize DM meds, follow labs, follow foot - ?further intervention, Ortho/Vasc recs, follow halucinations - meds d/ced, likely infection      Nursing  Medical stability;Wound Care;Nutrition means;Weight bearing restrictions  uncontrolled DM, Pain management; PD nightly            PT                    OT                  SLP                SW                Discharge Planning/Teaching Needs:    Home with wife who can provide assist-here daily and observing in therapies.     Team Discussion:  Medical issues-question infection. Dr Lajoyce Corners in yesterday to look at feet. X-ray of foot today. Adjusting pain meds and DM meds. Stage 2 B-ears, toe and groin. IV antibiotics currently, may need to go home with. Downgrade goals to supervision-min assist level  Revisions to Treatment Plan:  Downgrade goals to min-supervision level and DC 10/5    Continued Need for Acute Rehabilitation Level of Care: The patient requires daily medical management by a physician with specialized training in physical medicine and rehabilitation for the following conditions: Daily direction of a multidisciplinary physical rehabilitation program to ensure safe treatment while eliciting the highest outcome that is of practical value to the patient.: Yes Daily medical management of patient stability for increased activity during participation in an intensive rehabilitation regime.: Yes Daily analysis of laboratory values and/or radiology reports with any subsequent need for medication adjustment of medical intervention for : Blood pressure problems;Wound care problems;Other;Post surgical problems;Diabetes problems;Mood/behavior problems  Lucy Chris 01/04/2017, 8:47 AM

## 2017-01-04 NOTE — Progress Notes (Signed)
[facility]ICAL [facility]ICINE & REHABILITATION     PROGRESS NOTE  Subjective/Complaints:  Pt seen laying in bed this AM. He states he slept well overnight, but did have some hallucinations.  Discussed with PA.  Pt also had fever overnight and started on IV abx. He states he feels better this AM.   ROS: Denies nausea, vomiting, diarrhea, shortness of breath or chest pain   Objective: Vital Signs: Blood pressure (!) 144/71, pulse 95, temperature 98.6 F (37 C), temperature source Oral, resp. rate 16, height  (1.753 m), weight 90.5 kg (199 lb 9 oz), SpO2 98 %. Ct Head Wo Contrast  Result Date: 01/02/2017 CLINICAL DATA:  Ataxia, difficulty standing, hallucinations, altered mental status. EXAM: CT HEAD WITHOUT CONTRAST TECHNIQUE: Contiguous axial images were obtained from the base of the skull through the vertex without intravenous contrast. COMPARISON:  None. FINDINGS: Brain: No evidence of an acute infarct, acute hemorrhage, mass lesion, mass effect or hydrocephalus. Atrophy and periventricular low attenuation. Vascular: No hyperdense vessel or unexpected calcification. Skull: Normal. Negative for fracture or focal lesion. Sinuses/Orbits: No acute finding. Other: None. IMPRESSION: 1. No acute intracranial abnormality. 2. Atrophy and chronic microvascular white matter ischemic changes. Electronically Signed   By: Leanna Battles M.D.   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 Epicardial pacing leads are again seen overlying the right and left heart with new defibrillator like device partially imaged along the periphery of the left hemithorax with lead projecting up to the level of the aortic arch. Lungs are clear. No acute nor suspicious osseous abnormalities. IMPRESSION: Status post CABG with aortic  atherosclerosis. No acute pneumonic consolidation. Electronically Signed   By: Tollie Eth M.D.   On: 01/03/2017 23:21    Recent Labs  01/02/17 0756 01/04/17 0558  WBC 11.5* 10.2  HGB 9.5* 9.4*  HCT 29.5* 29.0*  PLT 428* 477*    Recent Labs  01/03/17 1223 01/04/17 0558  NA 130* 130*  K 4.6 4.5  CL 95* 91*  GLUCOSE 78 400*  BUN 49* 45*  CREATININE 6.76* 6.40*  CALCIUM 8.6* 8.4*   CBG (last 3)   Recent Labs  01/03/17 1627 01/03/17 2100 01/04/17 0619  GLUCAP 108* 248* 395*    Wt Readings from Last 3 Encounters:  01/04/17 90.5 kg (199 lb 9 oz)  12/27/16 89.2 kg (196 lb 10.4 oz)  12/14/16 92.1 kg (203 lb)    Physical Exam:  BP (!) 144/71 (BP Location: Right Arm)   Pulse 95   Temp 98.6 F (37 C) (Oral)   Resp 16   Ht  (1.753 m)   Wt 90.5 kg (199 lb 9 oz)   SpO2 98%   BMI 29.47 kg/m  Constitutional: He appears well-developedand well-nourished. No distress.  HENT: Normocephalicand atraumatic.  Eyes: EOMare normal. No discharge. Cardiovascular: RRR with murmur. No JVD. Respiratory: CTA Bilaterally. Normal effort  GI: Bowel sounds are normal. He exhibits no distension.  Musculoskeletal: He exhibits tenderness. He exhibits mild edema.  Neurological: He is alertand oriented.  Motor: B/l UE 5/5 prox to distal.  LLE 4+/5 HF, KE, 4/5 ADF/PF (unchanged, pain inhibition).  RLE: 4+/5  HF, KE and ADF/PF.  Skin: Skin is warmand dry. He is not diaphoretic.  Left TMA with dressing c/d/i.  Right groin wound packed  RLE  5th digit with ulcer Pressure ulcers to b/l ears and nose c/d/i PD sites dressed Psychiatric: He has a normal mood and affect. His behavior is normal. Judgmentand thought contentnormal.    Assessment/Plan: 1. Functional deficits secondary to left TMA which require 3+ hours per day of interdisciplinary therapy in a comprehensive inpatient rehab setting. Physiatrist is providing close team supervision and 24 hour management of active  medical problems listed below. Physiatrist and rehab team continue to assess barriers to discharge/monitor patient progress toward functional and medical goals.  Function:  Bathing Bathing position   Position: Wheelchair/chair at sink  Bathing parts Body parts bathed by patient: Left arm, Right arm, Chest, Abdomen, Front perineal area, Right upper leg, Left upper leg Body parts bathed by helper: Buttocks, Right lower leg, Left lower leg, Back  Bathing assist Assist Level: Touching or steadying assistance(Pt > 75%)      Upper Body Dressing/Undressing Upper body dressing   What is the patient wearing?: Pull over shirt/dress     Pull over shirt/dress - Perfomed by patient: Thread/unthread right sleeve, Thread/unthread left sleeve Pull over shirt/dress - Perfomed by helper: Put head through opening, Pull shirt over trunk        Upper body assist Assist Level: Touching or steadying assistance(Pt > 75%)      Lower Body Dressing/Undressing Lower body dressing   What is the patient wearing?: Underwear, Pants, Socks, Shoes Underwear - Performed by patient: Thread/unthread right underwear leg, Thread/unthread left underwear leg, Pull underwear up/down Underwear - Performed by helper: Thread/unthread right underwear leg, Thread/unthread left underwear leg, Pull underwear up/down Pants- Performed by patient: Thread/unthread right pants leg, Thread/unthread left pants leg, Pull pants up/down Pants- Performed by helper: Thread/unthread right pants leg, Thread/unthread left pants leg, Pull pants up/down Non-skid slipper socks- Performed by patient: Don/doff right sock Non-skid slipper socks- Performed by helper: Don/doff right sock (n/a left sock) Socks - Performed by patient: Don/doff right sock Socks - Performed by helper: Don/doff right sock Shoes - Performed by patient: Don/doff right shoe Shoes - Performed by helper: Don/doff right shoe          Lower body assist Assist for lower  body dressing: Touching or steadying assistance (Pt > 75%)      Toileting Toileting Toileting activity did not occur: N/A (pt oliguric, dialysis pt)   Toileting steps completed by helper: Adjust clothing prior to toileting, Performs perineal hygiene, Adjust clothing after toileting    Toileting assist Assist level: Two helpers (2 helpers initially, then total assist +1)   Transfers Chair/bed transfer   Chair/bed transfer method: Lateral scoot Chair/bed transfer assist level: Touching or steadying assistance (Pt > 75%) Chair/bed transfer assistive device: Armrests, Sliding board     Locomotion Ambulation Ambulation activity did not occur: Safety/medical concerns   Max distance: 5' Assist level: Touching or steadying assistance (Pt > 75%)   Wheelchair   Type: Manual Max wheelchair distance: 50 ft Assist Level: Supervision or verbal cues  Cognition Comprehension Comprehension assist level: Understands basic 90% of the time/cues < 10% of the time  Expression Expression assist level: Expresses complex ideas: With extra time/assistive device  Social Interaction Social Interaction assist level: Interacts appropriately 75 - 89% of the time - Needs redirection for appropriate language or to initiate interaction.  Problem Solving Problem solving assist level: Solves basic 75 - 89% of the time/requires cueing 10 - 24% of the time  Memory Memory assist level: Recognizes or recalls 75 - 89% of the time/requires cueing  10 - 24% of the time    Medical Problem List and Plan: 1. Functional and mobility deficits secondary to PAD requiring left TMA, right groin wound, multiple medical  Cont CIR  CT head reviewed 9/24, unremarkable for acute changes 2. DVT Prophylaxis/Anticoagulation: Pharmaceutical: Heparin 3. Pain Management: continue oxycodone prn.   Pain meds d/ced, however, will consider restarting as infection likely source of AMS 4. Mood: team to provide ego support. LCSW to follow  for evaluation and support.  5. Neuropsych: This patient iscapable of making decisions on hisown behalf. 6. Skin/Wound Care: dry dressing left foot  Monitor right groin wound continue dry dressing to groin  Nitroglycerin added by Dr. Lajoyce Corners  Vascular evaluated right 5th digit, ?plan to continue to follow   B/l ears, nose pressure ulcer, likely from CPAP - will pad areas 7. Fluids/Electrolytes/Nutrition: Monitor I/O. PD in evenings after therapy. 8. T2DM:   Used NPH at nights with novolog at home, lantus d/ced Novolog 15 TID started 9/20, d/ced 9/24.   Monitor BS ac/hs.   Continue SSI for elevated BS.   Home NPH dose is 70U qhs,  qHS NPH 40u on 9/22  Started on 20 units AM on 9/24, increased to 40 U on 9/25  Cont to follow, will likely need scheduled mealtime coverage, however, given lability and likely infection, will not make changes today 9. Constipation: Increased colace to home regimen with sorbitol daily prn.  10. OSA: has been compliant with BIPAP at nights.  11. Leucocytosis: Resolved  WBCs 10.2 on 9/26  Cont to monitor 12. Anemia of chronic disease: Continue to monitor.   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 History of CHF: Monitor weight daily--fluid mangedwith PD. On ASA, lipitor, coreg and lasix bid.  (cozaar and lasix on hold for now)   [facility] Weights   01/03/17 0640 01/03/17 1830 01/04/17 0518  Weight: 90.4 kg (199 lb 4.7 oz) 91.4 kg (201 lb 8 oz) 90.5 kg (199 lb 9 oz)   15. ESRD: On PD daily at nights. Tolerating it well at present 16.  Constipation- senna S 2 po BID, dulcolax this pm 17. Light-headedness/altered balance  -?medication +/- volume related  -lasix and cozaar on hold for now  -encourage fluids  -Likely due to infection 18. HTN  Cozaar on hold  Slightly labile, overall controlled 9/26 19. Hallucinations  Improving  Ucx NG  Likely due to infection from left foot  Blood CXs pending  Will order  Xray of foot, may need further eval.  Will consider reeval by Ortho  Cont IV abx  LOS (Days) 8 A FACE TO FACE EVALUATION WAS PERFORMED  Brina Umeda Karis Juba 01/04/2017 7:12 AM

## 2017-01-04 NOTE — Progress Notes (Signed)
Patient's temperature 100.7, HR 107,BP 139/50. He was alert oriented x2-3, no hallucinations noted as previously reported from Tramadol.  Patient was having some jerky movements but according to wife this is not new. Tylenol prn given and notified Delle Reining of patient's condition. Orders received for IV antibiotics per pharmacy , blood culture, portable chest xray. Pharmacy also reached out to [facility] regarding Antibx order. Also notified RR to see patient. Coreg given for bedtime as ordered. Patient was also having some incontinent episodes of both bowel and bladder. Per wife this has been unusual for him. Provided ice pack to cool patient and tolerated fine. Rechecked vitals with 98.9 , 117/49 102, 18, 98 RA. Will monitor.

## 2017-01-04 NOTE — Progress Notes (Signed)
Social Work Patient ID: [name], male   DOB: April 03, 1941, 76 y.o.   MRN: [id number]  Met with pt and wife to discuss team conference progress toward his goals and target discharge date 10/5. Both feel his medical issues are somewhat better since decreased the pain meds and Dr. Sharol Given came and saw him yesterday. Both hopeful will continue to move forward and progress. Wife aware She may need to provide more assist due to weight bearing issues. Aware of the x-ray today and are hoping for the best. Will continue to work on discharge needs for discharge next week.

## 2017-01-04 NOTE — Progress Notes (Signed)
Dressings changed  Left TAM with erythema on the medial aspect with dry eschar at incision Right 5th toe ulcer  --Recommend abx for erythema on TMA   --Continue dressing changes to right toe.  May end up with amputation at a later date.  Does not need revascularization on that side.  Cristopher Estimable [phone]

## 2017-01-04 NOTE — Progress Notes (Signed)
Social Work Kadedra Vanaken, Elveria Rising Social Worker Signed   Patient Care Conference Date of Service: 01/04/2017  8:47 AM      Hide copied text Hover for attribution information Inpatient RehabilitationTeam Conference and Plan of Care Update Date: 01/04/2017   Time: 8:20 AM      Patient Name: [name]      Medical Record Number: [id number]  Date of Birth: 1941-01-26 Sex: Male         Room/Bed: 4M08C/4M08C-01 Payor Info: Payor: MEDICARE / Plan: MEDICARE PART A AND B / Product Type: *No Product type* /     Admitting Diagnosis: L TMA  Admit Date/Time:  12/27/2016  4:38 PM Admission Comments: No comment available    Primary Diagnosis:  Debility Principal Problem: Debility       Patient Active Problem List    Diagnosis Date Noted  . Labile blood pressure    . Hallucination    . Benign essential HTN    . Ulcer of toe of right foot (HCC)    . Ulcer of external ear, limited to breakdown of skin (HCC)    . Chronic congestive heart failure (HCC)    . Anemia of chronic disease    . Labile blood glucose    . Uncontrolled diabetes mellitus type 2 with peripheral artery disease (HCC)    . Diabetic peripheral neuropathy (HCC)    . Debility 12/27/2016  . History of transmetatarsal amputation of left foot (HCC) 12/27/2016  . Constipation    . Chronic obstructive pulmonary disease (HCC)    . Nausea    . ESRD on dialysis (HCC)    . Leukocytosis    . Tachypnea    . FUO (fever of unknown origin)    . Encephalopathy 12/23/2016  . Sore throat 12/23/2016  . Gangrene of left foot (HCC) 11/26/2016  . PAD (peripheral artery disease) (HCC) 11/26/2016  . Sjogren's syndrome (HCC) 09/26/2015  . Uncontrolled type 2 diabetes mellitus with hyperglycemia, with long-term current use of insulin (HCC) 09/26/2015  . Hyperosmolar non-ketotic state in patient with type 2 diabetes mellitus (HCC) 09/25/2015  . Ileus (HCC) 09/22/2015  . Acute respiratory failure with hypoxia (HCC) 09/22/2015  . Acute  gouty arthritis 09/22/2015  . CAD (coronary artery disease) of artery bypass graft 09/22/2015  . Sepsis (HCC) 09/21/2015  . Elevated troponin level 09/21/2015  . ESRD on peritoneal dialysis (HCC) 09/21/2015  . CVA (cerebral infarction) 09/21/2015      Expected Discharge Date: Expected Discharge Date: 01/13/17   Team Members Present: Physician leading conference: Dr. Maryla Morrow Social Worker Present: Dossie Der, LCSW Nurse Present: Allayne Stack, RN PT Present: Woodfin Ganja, PT OT Present: Perrin Maltese, OT       Current Status/Progress Goal Weekly Team Focus  Medical     Functional and mobility deficits  secondary to PAD requiring left TMA, right groin wound, multiple medical  Improve mobility, transferes, safety, DM, wounds, infection  See above   Bowel/Bladder     Continent of bowel and bladder; LBM 9/25; PD nightly  Mod I assist  Assess and treat for constipation as needed   Swallow/Nutrition/ Hydration     Poor appetite         ADL's     mod assist for LB selfcare, supervision UB selfcare, mod assist transfers stand pivot with use of the RW.  supervision to min assist  standing balance, transfer training, selfcare retraining, strength endurance, strength training, pt/family education   Mobility  min to mod assist for slideboard transfers, max assist for sit<>stand and gait  supervision-Mod I   adherence to precautions, slideboard transfers, w/c propulsion   Communication               Safety/Cognition/ Behavioral Observations             Pain     C/o pain in left foot intermittently, tylenol and tramadol used only; patient cannot tolerate strong meds  < 4  Assess and treat for pain q shift and prn   Skin     Incision to left foot with minimal serosanguinous drainage, area of purple skin on medial part of incision; stage II to nasal bridge; DTI to right ear; MASD to sacrum (MCG powder), right groin wound dressing changes BID; wound to right 5th toe with aquacel  dressings BID  Mod assist  Assess skin q shift and prn; perform dressing changes per MD order     *See Care Plan and progress notes for long and short-term goals.      Barriers to Discharge   Current Status/Progress Possible Resolutions Date Resolved   Physician     Medical stability;Decreased caregiver support;Wound Care;Lack of/limited family support;Other (comments);IV antibiotics     See above  Therapies, optimize DM meds, follow labs, follow foot - ?further intervention, Ortho/Vasc recs, follow halucinations - meds d/ced, likely infection      Nursing   Medical stability;Wound Care;Nutrition means;Weight bearing restrictions  uncontrolled DM, Pain management; PD nightly           PT                    OT                 SLP            SW              Discharge Planning/Teaching Needs:    Home with wife who can provide assist-here daily and observing in therapies.     Team Discussion:  Medical issues-question infection. Dr Lajoyce Corners in yesterday to look at feet. X-ray of foot today. Adjusting pain meds and DM meds. Stage 2 B-ears, toe and groin. IV antibiotics currently, may need to go home with. Downgrade goals to supervision-min assist level  Revisions to Treatment Plan:  Downgrade goals to min-supervision level and DC 10/5    Continued Need for Acute Rehabilitation Level of Care: The patient requires daily medical management by a physician with specialized training in physical medicine and rehabilitation for the following conditions: Daily direction of a multidisciplinary physical rehabilitation program to ensure safe treatment while eliciting the highest outcome that is of practical value to the patient.: Yes Daily medical management of patient stability for increased activity during participation in an intensive rehabilitation regime.: Yes Daily analysis of laboratory values and/or radiology reports with any subsequent need for medication adjustment of medical intervention for :  Blood pressure problems;Wound care problems;Other;Post surgical problems;Diabetes problems;Mood/behavior problems   Lucy Chris 01/04/2017, 8:47 AM           Patient ID: [name], male   DOB: February 18, 1941, 76 y.o.   MRN: [id number]

## 2017-01-04 NOTE — Progress Notes (Signed)
Subjective:  UF 1500, tolerated well. Had low grade fever last night- seen by rapid response- stopped tramadol- did cultures and started fortaz- he looks better today than I have seen Objective Vital signs in last 24 hours: Vitals:   01/04/17 0518 01/04/17 0624 01/04/17 0810 01/04/17 1045  BP:  (!) 144/71 (!) 141/55 100/60  Pulse:  95 (!) 102   Resp:  16 18   Temp:  98.6 F (37 C) 98.8 F (37.1 C)   TempSrc:  Oral Oral   SpO2:  98%    Weight: 90.5 kg (199 lb 9 oz)  90.2 kg (198 lb 13.7 oz)   Height:       Weight change: -0.6 kg (-1 lb 5.2 oz)  Intake/Output Summary (Last 24 hours) at 01/04/17 1311 Last data filed at 01/04/17 0810  Gross per 24 hour  Intake            12615 ml  Output            14283 ml  Net            -1668 ml    Dialysis Orders: Center: [facility]. 6 exchanges/day one mid-day exchange (occurs aroudn 9am). 2.5 liters of 2.5% per [facility] and 2 liters of 2.5% for last fill and mid day drain.  1. 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  S/p L transmet  12/23/16 with Dr. Lajoyce [name].  2. ESRD- [facility], using 2.5% dextrose; To facilitate CIR doing: [facility]CPD 2.5L x 5 exchanges, , Dry Day.  At DC can resume prev PD Rx. Will just order labs for Friday 3. Hypertension/volume- BP stable- hyponatremia argues for volume overload-  Was on all 1.5% fluid- will use 2.5% fluid- coreg decreased to at night only - he seems to be [name] little orthostatic- will change fluids to 1/2 1.5 and 1/2 2.5  4. Anemia (ABLA)- TSAT 45% and Ferritin 2284, cont Aranesp 100 qThurs- trending up  5. Constipation: miralax as needed 6. Hypokalemia: supplementing- now up  Have stopped  7. Myoclonus / COnfusion: stop gabapentin and robaxin, limit narcotics, follow 8. Bones- cont renavite [name] - last phos has been ok - will check again 9. Hyperglycemia- may need more insulin 10. Low grade temp- blood cultures and fortaz- will check fluid cell count  as well even though not clinically infected   [name]    Labs: Basic Metabolic Panel:  Recent Labs Lab 12/29/16 0601  01/02/17 0756 01/03/17 1223 01/04/17 0558  NA 130*  < > 127* 130* 130*  K 4.8  < > 5.0 4.6 4.5  CL 91*  < > 90* 95* 91*  CO2 27  < > GLUCOSE 466*  < > 383* 78 400*  BUN 32*  < > 47* 49* 45*  CREATININE 7.29*  < > 7.11* 6.76* 6.40*  CALCIUM 8.1*  < > 8.3* 8.6* 8.4*  PHOS 3.9  --   --  3.7 4.3  < > = values in this interval not displayed. Liver Function Tests:  Recent Labs Lab 01/02/17 0756 01/03/17 1223 01/04/17 0558  AST 25  --   --   ALT 18  --   --   ALKPHOS 68  --   --   BILITOT 0.4  --   --   PROT 5.8*  --   --   ALBUMIN 1.7* 1.6* 1.6*   No results for input(s): LIPASE, AMYLASE in the last 168  hours. No results for input(s): AMMONIA in the last 168 hours. CBC:  Recent Labs Lab 12/29/16 0601 12/31/16 0602 01/02/17 0756 01/04/17 0558  WBC 12.3* 12.2* 11.5* 10.2  NEUTROABS  --   --  8.7*  --   HGB 9.0* 9.2* 9.5* 9.4*  HCT 28.5* 28.5* 29.5* 29.0*  MCV 96.6 95.0 96.1 95.4  PLT 353 418* 428* 477*   Cardiac Enzymes: No results for input(s): CKTOTAL, CKMB, CKMBINDEX, TROPONINI in the last 168 hours. CBG:  Recent Labs Lab 01/03/17 1126 01/03/17 1627 01/03/17 2100 01/04/17 0619 01/04/17 1156  GLUCAP 111* 108* 248* 395* 205*    Iron Studies: No results for input(s): IRON, TIBC, TRANSFERRIN, FERRITIN in the last 72 hours. Studies/Results: Ct Head Wo Contrast  Result Date: 01/02/2017 CLINICAL DATA:  Ataxia, difficulty standing, hallucinations, altered mental status. EXAM: CT HEAD WITHOUT CONTRAST TECHNIQUE: Contiguous axial images were obtained from the base of the skull through the vertex without intravenous contrast. COMPARISON:  None. FINDINGS: Brain: No evidence of an acute infarct, acute hemorrhage, mass lesion, mass effect or hydrocephalus. Atrophy and periventricular low attenuation. Vascular: No hyperdense  vessel or unexpected calcification. Skull: Normal. Negative for fracture or focal lesion. Sinuses/Orbits: No acute finding. Other: None. IMPRESSION: 1. No acute intracranial abnormality. 2. Atrophy and chronic microvascular white matter ischemic changes. Electronically Signed   By: [name] M.D.   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 Epicardial pacing leads are again seen overlying the right and left heart with new defibrillator like device partially imaged along the periphery of the left hemithorax with lead projecting up to the level of the aortic arch. Lungs are clear. No acute nor suspicious osseous abnormalities. IMPRESSION: Status post CABG with aortic atherosclerosis. No acute pneumonic consolidation. Electronically Signed   By: [name] M.D.   On: 01/03/2017 23:21   Dg Foot Complete Left  Result Date: 01/04/2017 CLINICAL DATA:  Pain.  Status post midfoot amputation EXAM: LEFT FOOT - COMPLETE 3+ VIEW COMPARISON:  November 25, 2016 FINDINGS: Frontal, oblique, and lateral views were obtained. There has been amputation at the levels of the proximal metatarsals. There is soft tissue air in the stump region, presumably of postoperative etiology. There is no well-defined soft tissue abscess by radiography. Remaining bony structures appear intact without fracture or dislocation. No erosive change or bony destruction. There is extensive arterial vascular calcification. 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 No bony abnormality noted currently. Soft tissue air noted in the stump region, likely of postoperative etiology. [name] degree of underlying infection in this area cannot be excluded radiographically. Multiple foci of arterial vascular calcification noted.  Electronically Signed   By: [name] M.D.   On: 01/04/2017 10:20   Medications: Infusions: . cefTAZidime (FORTAZ)  IV    . dialysis solution 2.5% low-MG/low-CA    . dialysis solution 2.5% low-MG/low-CA    . dialysis solution 2.5% low-MG/low-CA      Scheduled Medications: . aspirin EC  81 mg Oral Daily  . atorvastatin  80 mg Oral q1800  . calcium acetate  667 mg Oral TID WC  . carvedilol  12.5 mg Oral QHS  . clopidogrel  75 mg Oral Daily  . darbepoetin (ARANESP) injection - DIALYSIS  100 mcg Subcutaneous Q Thu-1800  . feeding  supplement (NEPRO CARB STEADY)  237 mL Oral TID WC  . feeding supplement (PRO-STAT SUGAR FREE 64)  30 mL Oral BID  . gentamicin cream  1 application Topical Daily  . heparin  5,000 Units Subcutaneous Q8H  . insulin aspart  0-20 Units Subcutaneous TID WC  . insulin aspart  0-5 Units Subcutaneous QHS  . insulin NPH Human  40 Units Subcutaneous QHS  . insulin NPH Human  40 Units Subcutaneous QAC breakfast  . isosorbide mononitrate  30 mg Oral Daily  . mouth rinse  15 mL Mouth Rinse BID  . multivitamin  1 tablet Oral QHS  . neomycin-bacitracin-polymyxin   Topical Daily  . nitroGLYCERIN  0.2 mg Transdermal Daily  . pantoprazole  40 mg Oral Daily  . senna-docusate  2 tablet Oral BID  . [START ON 01/08/2017] Vitamin D (Ergocalciferol)  50,000 Units Oral Q30 days    have reviewed scheduled and prn medications.  Physical Exam: General: in chair, looks good-- just got done with therapy Heart: RRR Lungs: mostly clear Abdomen: obese, soft, non tender Extremities: pitting edema Dialysis Access: PD cath     01/04/2017,1:11 PM  LOS: 8 days

## 2017-01-04 NOTE — Progress Notes (Signed)
Performed manual b/p check. B/p 100/60 J Tiandra Swoveland, SN

## 2017-01-04 NOTE — Progress Notes (Signed)
Late note for 01/03/17 pm: Patient had better day yesterday with resolution of  Tremors and confusion. Nursing did report increase in left foot pain after therapy and low dose tramadol ordered after am therapy. He did have recurrent episode of visual t hallucinations later yesterday afternoon but reported good results with pain control.  He developed fever last pm with increase in confusion. Confusion likely due to meds and fever--Nurse given instructions to d/c ultram. BC X 2 and CXR ordered for work up. Vanc/Zosyn ordered but patient with allergy to vancomycin. Antibiotic regimen discussed with pharmacy and with recommendations to start Fortaz to help cover pulmonary and/or wound.

## 2017-01-04 NOTE — Plan of Care (Signed)
Problem: RH BOWEL ELIMINATION Goal: RH STG MANAGE BOWEL WITH ASSISTANCE STG Manage Bowel with mod I Assistance.  Outcome: Not Progressing Was incontinent overnight

## 2017-01-04 NOTE — Progress Notes (Signed)
Occupational Therapy Weekly Progress Note  Patient Details  Name: [name] MRN: [id number] Date of Birth: [birth year]  Beginning of progress report period: December 28, 2016 End of progress report period: January 04, 2017  Today's Date: 01/04/2017 OT Individual Time: 1015-1100 OT Individual Time Calculation (min): 45 min    Patient has met 3 of 5 short term goals.  Pt continues to making fluctuating progress with therapy.  Currently he needs mod to max assist for sit to stand with LB selfcare tasks, while adhering to his NWBing status on the LLE.   Squat pivot transfers are at a mod assist level with stand pivot at a max assist.  Pt has had a decline in function secondary to medical issues as he was at min assist for these tasks earlier last week, but an infection and over sedation of pain meds have caused him issues.  Still feel he needs continued CIR level therapy with anticipation of discharge 10/5 with min assist to supervision level goals.    Patient continues to demonstrate the following deficits: muscle weakness and decreased standing balance, decreased balance strategies and difficulty maintaining precautions and therefore will continue to benefit from skilled OT intervention to enhance overall performance with BADL and Reduce care partner burden.  Patient progressing toward long term goals..  Continue plan of care.  OT Short Term Goals Week 2:  OT Short Term Goal 1 (Week 2): Continue working on established LTGs set at min assist level.  Skilled Therapeutic Interventions/Progress Updates:    Pt completed bathing and dressing during session at the sink.  Mod assist for squat pivot transfer to the wheelchair from the bed.  He was able to complete all UB bathing with supervision.  Min assist for donning pullover shirt, to pull the shirt down in the back.  Mod assist for initial sit to stand and standing balance in order to keep the weight off of his LLE.  Increased difficulty  donning LB clothing over his feet.  Mod assist to thread underpants and sock over the RLE.  Total +2 (pt 25%) for standing to pull underpants and pants over hips to complete session.  Pt left in wheelchair with nursing present to change LLE dressing.  Spouse present in room as well.  Therapy Documentation Precautions:  Precautions Precautions: Fall Precaution Comments: PD port Required Braces or Orthoses: Other Brace/Splint Other Brace/Splint: post op shoe Lt  Restrictions Weight Bearing Restrictions: Yes LLE Weight Bearing: Non weight bearing LLE Partial Weight Bearing Percentage or Pounds: Darco shoe General: General OT Amount of Missed Time: 15 Minutes  Pain: Pain Assessment Pain Assessment: Faces Pain Score: 3  Faces Pain Scale: Hurts little more Pain Type: Acute pain Pain Location: Foot Pain Orientation: Left Pain Descriptors / Indicators: Shooting;Sharp Pain Onset: On-going Patients Stated Pain Goal: 3 Pain Intervention(s): Repositioned ADL: See Function Navigator for Current Functional Status.   Therapy/Group: Individual Therapy  Sharron Simpson,Royer OTR/L 01/04/2017, 12:30 PM

## 2017-01-04 NOTE — Progress Notes (Signed)
RT NOTE:  Pt has home CPAP unit. Family will manage. RT available for assistance if needed.

## 2017-01-05 ENCOUNTER — Inpatient Hospital Stay (HOSPITAL_COMMUNITY): Payer: Medicare Other | Admitting: Physical Therapy

## 2017-01-05 ENCOUNTER — Inpatient Hospital Stay (HOSPITAL_COMMUNITY): Payer: Medicare Other | Admitting: Occupational Therapy

## 2017-01-05 LAB — GLUCOSE, CAPILLARY
GLUCOSE-CAPILLARY: 187 mg/dL — AB (ref 65–99)
Glucose-Capillary: 205 mg/dL — ABNORMAL HIGH (ref 65–99)
Glucose-Capillary: 201 mg/dL — ABNORMAL HIGH (ref 65–99)
Glucose-Capillary: 274 mg/dL — ABNORMAL HIGH (ref 65–99)

## 2017-01-05 MED ORDER — DELFLEX-LC/2.5% DEXTROSE 394 MOSM/L IP SOLN: Freq: Every day | INTRAPERITONEAL | Status: DC

## 2017-01-05 MED ORDER — HEPARIN 1000 UNIT/ML FOR PERITONEAL DIALYSIS: 500.0000 [IU] | INTRAMUSCULAR | Status: DC | PRN

## 2017-01-05 MED ORDER — DELFLEX-LC/1.5% DEXTROSE 344 MOSM/L IP SOLN: Freq: Every day | INTRAPERITONEAL | Status: DC

## 2017-01-05 NOTE — Plan of Care (Signed)
Problem: RH Balance Goal: LTG Patient will maintain dynamic standing balance (PT) LTG:  Patient will maintain dynamic standing balance with assistance during mobility activities (PT)  Goal downgrade due to pt's decline in status and decreased participation in therapies.   Problem: RH Bed Mobility Goal: LTG Patient will perform bed mobility with assist (PT) LTG: Patient will perform bed mobility with assistance, with/without cues (PT).  Goal downgrade due to pt's decline in status and decreased participation in therapies.   Problem: RH Bed to Chair Transfers Goal: LTG Patient will perform bed/chair transfers w/assist (PT) LTG: Patient will perform bed/chair transfers with assistance, with/without cues (PT).  Goal downgrade due to pt's decline in status and decreased participation in therapies.   Problem: RH Furniture Transfers Goal: LTG Patient will perform furniture transfers w/assist (OT/PT LTG: Patient will perform furniture transfers  with assistance (OT/PT).  Goal downgrade due to pt's decline in status and decreased participation in therapies.   Problem: RH Ambulation Goal: LTG Patient will ambulate in controlled environment (PT) LTG: Patient will ambulate in a controlled environment, # of feet with assistance (PT).  Goal downgrade due to pt's decline in status and decreased participation in therapies.  Goal: LTG Patient will ambulate in home environment (PT) LTG: Patient will ambulate in home environment, # of feet with assistance (PT).  Goal downgrade due to pt's decline in status and decreased participation in therapies.   Comments: Goals downgrade due to pt's decline in status and decreased participation in therapies.   Finas Delone Arnette, PT, DPT

## 2017-01-05 NOTE — Progress Notes (Signed)
Occupational Therapy Session Note  Patient Details  Name: [name] MRN: [id number] Date of Birth: April 04, 1941  Today's Date: 01/05/2017 OT Individual Time: 1345-1430 OT Individual Time Calculation (min): 45 min    Short Term Goals: Week 2:  OT Short Term Goal 1 (Week 2): Continue working on established LTGs set at min assist level.  Skilled Therapeutic Interventions/Progress Updates:    Pt seen for OT session focusing on functional transfers and toileting task. Pt in supine upon arrival, requiring encouragement for participation, but eventually agreeable. Pt told therapist of difficulty of getting onto toilet this A.M. And pt agreeable to working with therapist for better method. Therapist retrieved bariatric drop arm BSC and pt completed EOB<>w/c and w/c<>drop arm BSC transfers with min A and assist to place board with VCs for technique and sequencing. Upon sitting in w/c pt voiced urgent need for BM, therefore completed full toileting task once on BSC. Pt completed lateral leans for clothing management to pull pants down and hygiene (assist provided by therapist for thoroughness). Pt too fatigued to want to attempt lateral leans to pull pants back up, therefore pt completed push up using B armrests and therapist assisted with clothing management. Pt returned to bed at end of session, left in supine with all needs in reach and family member present.  Observed drainage on L LE ACE bandage, RN made aware   Therapy Documentation Precautions:  Precautions Precautions: Fall Precaution Comments: PD port Required Braces or Orthoses: Other Brace/Splint Other Brace/Splint: post op shoe Lt  Restrictions Weight Bearing Restrictions: Yes LLE Weight Bearing: Non weight bearing LLE Partial Weight Bearing Percentage or Pounds: Darco shoe Pain: Pain Assessment Pain Assessment: 0-10 Pain Score: 8  Pain Type: Acute pain Pain Location: Foot Pain Orientation: Left Pain Descriptors /  Indicators: Sharp;Burning Pain Frequency: Constant Pain Onset: On-going Pain Intervention(s): Repositioned, increased activity ADL: ADL ADL Comments: see functional navigator  See Function Navigator for Current Functional Status.   Therapy/Group: Individual Therapy  Lewis, Balen Woolum C 01/05/2017, 7:19 AM

## 2017-01-05 NOTE — Progress Notes (Signed)
Subjective:  [name] , tolerated well. Had low grade fever last night, 99.4 - had episode of dec responsiveness unclear etiology- BP has been OK, seems OK now coming back to room from therapy   Objective Vital signs in last 24 hours: Vitals:   01/04/17 1825 01/04/17 2122 01/05/17 0500 01/05/17 0546  BP: (!) 124/53 (!) 155/70  (!) 132/53  Pulse: (!) 101 (!) 104  (!) 105  Resp: 18   20  Temp: 98.5 F (36.9 C)   99.4 F (37.4 C)  TempSrc: Oral   Oral  SpO2: 93%   95%  Weight: 91.3 kg (201 lb 4.5 oz)  87.9 kg (193 lb 12.6 oz)   Height:       Weight change: -1.2 kg (-2 lb 10.3 oz)  Intake/Output Summary (Last 24 hours) at 01/05/17 1123 Last data filed at 01/05/17 0750  Gross per 24 hour  Intake            12741 ml  Output            13342 ml  Net             -601 ml    Dialysis Orders: Center: [facility]. 6 exchanges/day one mid-day exchange (occurs aroudn 9am). 2.5 liters of 2.5% per [facility] and 2 liters of 2.5% for last fill and mid day drain.  1. 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  S/p L transmet  12/23/16 with Dr. Lajoyce [name].  2. ESRD- CCPD, using 2.5% dextrose; To facilitate CIR doing: CCPD 2.5L x 5 exchanges, , Dry Day.  At DC can resume prev PD Rx. Will just order labs for Friday- oon 9/26 I dec fluids to 1/2 1.5% and 1/2 2.5 % with good results, to continue  3. Hypertension/volume- BP stable- hyponatremia argues for volume overload-  Was on all 1.5% fluid- used 2.5% fluid- coreg decreased to at night only - he seems to be a little orthostatic- have changed fluids to 1/2 1.5 and 1/2 2.5  4. Anemia (ABLA)- TSAT 45% and Ferritin 2284, cont Aranesp 100 qThurs- trending up  5. Constipation: miralax as needed 6. Hypokalemia: supplementing- now up  Have stopped  7. Myoclonus / COnfusion: stop gabapentin and robaxin, limit narcotics, follow 8. Bones- cont renavite [name] - last phos has been ok - will check  again 9. Hyperglycemia- may need more insulin 10. Low grade temp- blood cultures neg to date and fortaz-  fluid cell count fine   [name] A    Labs: Basic Metabolic Panel:  Recent Labs Lab 01/02/17 0756 01/03/17 1223 01/04/17 0558  NA 127* 130* 130*  K 5.0 4.6 4.5  CL 90* 95* 91*  CO2 GLUCOSE 383* 78 400*  BUN 47* 49* 45*  CREATININE 7.11* 6.76* 6.40*  CALCIUM 8.3* 8.6* 8.4*  PHOS  --  3.7 4.3   Liver Function Tests:  Recent Labs Lab 01/02/17 0756 01/03/17 1223 01/04/17 0558  AST 25  --   --   ALT 18  --   --   ALKPHOS 68  --   --   BILITOT 0.4  --   --   PROT 5.8*  --   --   ALBUMIN 1.7* 1.6* 1.6*   No results for input(s): LIPASE, AMYLASE in the last 168 hours. No results for input(s): AMMONIA in the last 168 hours. CBC:  Recent Labs Lab 12/31/16 0602 01/02/17 0756 01/04/17 0558  WBC  12.2* 11.5* 10.2  NEUTROABS  --  8.7*  --   HGB 9.2* 9.5* 9.4*  HCT 28.5* 29.5* 29.0*  MCV 95.0 96.1 95.4  PLT 418* 428* 477*   Cardiac Enzymes: No results for input(s): CKTOTAL, CKMB, CKMBINDEX, TROPONINI in the last 168 hours. CBG:  Recent Labs Lab 01/04/17 0619 01/04/17 1156 01/04/17 1655 01/04/17 2055 01/05/17 0620  GLUCAP 395* 205* 155* 246* 205*    Iron Studies: No results for input(s): IRON, TIBC, TRANSFERRIN, FERRITIN in the last 72 hours. 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 Epicardial pacing leads are again seen overlying the right and left heart with new defibrillator like device partially imaged along the periphery of the left hemithorax with lead projecting up to the level of the aortic arch. Lungs are clear. No acute nor suspicious osseous abnormalities. IMPRESSION: Status post CABG with aortic atherosclerosis. No acute pneumonic  consolidation. Electronically Signed   By: [name] M.D.   On: 01/03/2017 23:21   Dg Foot Complete Left  Result Date: 01/04/2017 CLINICAL DATA:  Pain.  Status post midfoot amputation EXAM: LEFT FOOT - COMPLETE 3+ VIEW COMPARISON:  November 25, 2016 FINDINGS: Frontal, oblique, and lateral views were obtained. There has been amputation at the levels of the proximal metatarsals. There is soft tissue air in the stump region, presumably of postoperative etiology. There is no well-defined soft tissue abscess by radiography. Remaining bony structures appear intact without fracture or dislocation. No erosive change or bony destruction. There is extensive arterial vascular calcification. 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 No bony abnormality noted currently. Soft tissue air noted in the stump region, likely of postoperative etiology. A degree of underlying infection in this area cannot be excluded radiographically. Multiple foci of arterial vascular calcification noted. Electronically Signed   By: [name] III M.D.   On: 01/04/2017 10:20   Medications: Infusions: . cefTAZidime (FORTAZ)  IV Stopped (01/05/17 0008)  . dialysis solution 1.5% low-MG/low-CA    . dialysis solution 2.5% low-MG/low-CA      Scheduled Medications: . aspirin EC  81 mg Oral Daily  . atorvastatin  80 mg Oral q1800  . calcium acetate  667 mg Oral TID WC  . carvedilol  12.5 mg Oral QHS  . clopidogrel  75 mg Oral Daily  . darbepoetin (ARANESP) injection - DIALYSIS  100 mcg Subcutaneous Q Thu-1800  . feeding supplement (NEPRO CARB STEADY)  237 mL Oral TID WC  . feeding supplement (PRO-STAT SUGAR FREE 64)  30 mL Oral BID  . gentamicin cream  1 application Topical Daily  . heparin  5,000 Units Subcutaneous Q8H  . insulin aspart  0-20 Units Subcutaneous TID WC  . insulin aspart  0-5 Units Subcutaneous QHS  . insulin NPH Human  40 Units Subcutaneous QHS  . insulin NPH Human  40 Units  Subcutaneous QAC breakfast  . isosorbide mononitrate  30 mg Oral Daily  . mouth rinse  15 mL Mouth Rinse BID  . multivitamin  1 tablet Oral QHS  . neomycin-bacitracin-polymyxin   Topical Daily  . nitroGLYCERIN  0.2 mg Transdermal Daily  . pantoprazole  40 mg Oral Daily  . senna-docusate  2 tablet Oral BID  . [START ON 01/08/2017] Vitamin D (Ergocalciferol)  50,000 Units Oral Q30 days    have reviewed scheduled  and prn medications.  Physical Exam: General: in chair, looks good-- just got done with therapy Heart: RRR Lungs: mostly clear Abdomen: obese, soft, non tender Extremities: pitting edema Dialysis Access: PD cath     01/05/2017,11:23 AM  LOS: 9 days

## 2017-01-05 NOTE — Progress Notes (Signed)
Pt wears home CPAP.  Pt stated he did not need help putting it on.  RT is available for assistance if needed.

## 2017-01-05 NOTE — Progress Notes (Signed)
Physical Therapy Weekly Progress Note  Patient Details  Name: [name] MRN: [id number] Date of Birth: [birth year]  Beginning of progress report period: December 28, 2016 End of progress report period: January 05, 2017  Today's Date: 01/05/2017 PT Missed Time: 60 Minutes Missed Time Reason: Increased agitation;Patient unwilling to participate  Patient has met 2 of 4 short term goals. Pt continues to consistently require Min-Mod A for stand pivot transfers, Min A for bed mobility, and Min A for gait up to 5'. He demonstrates decreased tolerance to OOB activity and increased LLE pain in addition to continued difficulty w/ maintaining surgical precautions. Goals downgraded to Min A to supervision overall at this time.   Patient continues to demonstrate the following deficits muscle weakness, decreased cardiorespiratoy endurance and decreased standing balance, decreased postural control, decreased balance strategies and difficulty maintaining precautions and therefore will continue to benefit from skilled PT intervention to increase functional independence with mobility.  Patient not progressing toward long term goals.  See goal revision..  Plan of care revisions: see downgrades of LTGs.  PT Short Term Goals Week 1:  PT Short Term Goal 1 (Week 1): Pt to be mod I with supine<>sitting. PT Short Term Goal 1 - Progress (Week 1): Progressing toward goal PT Short Term Goal 2 (Week 1): Sit<>stand to with min assist and LRAD PT Short Term Goal 2 - Progress (Week 1): Met PT Short Term Goal 3 (Week 1): Pt to ambulate 5 ft with LRAD PT Short Term Goal 3 - Progress (Week 1): Met PT Short Term Goal 4 (Week 1): Up/down 1 step with mod assist.  PT Short Term Goal 4 - Progress (Week 1): Not met Week 2:  PT Short Term Goal 1 (Week 2): =LTGs due to ELOS  Skilled Therapeutic Interventions/Progress Updates:   Pt in 10/10 pain upon arrival while supine in bed, RN made aware. Pt w/ increased agitation  and shortness w/ PT, unwilling to participate stating "they told me no more transfers today, I'm not doing it". Per daughter and RN, no one told pt that he didn't have anymore therapy. This was repeated back to pt who continuing to refuse any activity this afternoon.   Therapy Documentation Precautions:  Precautions Precautions: Fall Precaution Comments: PD port Required Braces or Orthoses: Other Brace/Splint Other Brace/Splint: post op shoe Lt  Restrictions Weight Bearing Restrictions: Yes LLE Weight Bearing: Non weight bearing LLE Partial Weight Bearing Percentage or Pounds: Darco shoe Vital Signs: Therapy Vitals Temp: 99.4 F (37.4 C) Temp Source: Oral Pulse Rate: (!) 105 Resp: 20 BP: (!) 132/53 Patient Position (if appropriate): Lying Oxygen Therapy SpO2: 95 % O2 Device: Not Delivered Pain: Pain Assessment Pain Assessment: No/denies pain  See Function Navigator for Current Functional Status.  Therapy/Group: Individual Therapy  [name] 01/05/2017, 7:54 AM

## 2017-01-05 NOTE — Progress Notes (Signed)
[facility] PHYSICAL MEDICINE & REHABILITATION     PROGRESS NOTE  Subjective/Complaints:  Pt had a good day in therapy, had episode this am of reduced responsiveness, per wife, pt removed CPAP and then would not respond to verbal commands.  RN was not initially able to arouse pt but returned to room and pt was alert and oriented No  Shaking with this episode, no hx seizure, reviewed recent work up with pt and wife Appreciate Nephro and Vasc surg notes  ROS: Denies nausea, vomiting, diarrhea, shortness of breath or chest pain   Objective: Vital Signs: Blood pressure (!) 132/53, pulse (!) 105, temperature 99.4 F (37.4 C), temperature source Oral, resp. rate 20, height  (1.753 m), weight 87.9 kg (193 lb 12.6 oz), SpO2 95 %. Dg Chest Port 1 View  Result Date: 01/03/2017 CLINICAL DATA:  Fever this evening EXAM: PORTABLE CHEST 1 VIEW COMPARISON:  09/21/2015 FINDINGS: Status post CABG with borderline cardiomegaly and aortic atherosclerosis. Epicardial pacing leads are again seen overlying the right and left heart with new defibrillator like device partially imaged along the periphery of the left hemithorax with lead projecting up to the level of the aortic arch. Lungs are clear. No acute nor suspicious osseous abnormalities. IMPRESSION: Status post CABG with aortic atherosclerosis. No acute pneumonic consolidation. Electronically Signed   By: Tollie Eth M.D.   On: 01/03/2017 23:21   Dg Foot Complete Left  Result Date: 01/04/2017 CLINICAL DATA:  Pain.  Status post midfoot amputation EXAM: LEFT FOOT - COMPLETE 3+ VIEW COMPARISON:  November 25, 2016 FINDINGS: Frontal, oblique, and lateral views were obtained. There has been amputation at the levels of the proximal metatarsals. There is soft tissue air in the stump region, presumably of postoperative etiology. There is no well-defined soft tissue abscess by radiography. Remaining bony structures appear intact without fracture or dislocation. No erosive  change or bony destruction. There is extensive arterial vascular calcification. 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 No bony abnormality noted currently. Soft tissue air noted in the stump region, likely of postoperative etiology. A degree of underlying infection in this area cannot be excluded radiographically. Multiple foci of arterial vascular calcification noted. Electronically Signed   By: Bretta Bang III M.D.   On: 01/04/2017 10:20    Recent Labs  01/02/17 0756 01/04/17 0558  WBC 11.5* 10.2  HGB 9.5* 9.4*  HCT 29.5* 29.0*  PLT 428* 477*    Recent Labs  01/03/17 1223 01/04/17 0558  NA 130* 130*  K 4.6 4.5  CL 95* 91*  GLUCOSE 78 400*  BUN 49* 45*  CREATININE 6.76* 6.40*  CALCIUM 8.6* 8.4*   CBG (last 3)   Recent Labs  01/04/17 1655 01/04/17 2055 01/05/17 0620  GLUCAP 155* 246* 205*    Wt Readings from Last 3 Encounters:  01/05/17 87.9 kg (193 lb 12.6 oz)  12/27/16 89.2 kg (196 lb 10.4 oz)  12/14/16 92.1 kg (203 lb)    Physical Exam:  BP (!) 132/53 (BP Location: Right Arm)   Pulse (!) 105   Temp 99.4 F (37.4 C) (Oral)   Resp 20   Ht  (1.753 m)   Wt 87.9 kg (193 lb 12.6 oz)   SpO2 95%   BMI 28.62 kg/m  Constitutional: He appears well-developedand well-nourished. No distress.  HENT: Normocephalicand atraumatic.  Eyes: EOMare normal. No discharge. Cardiovascular: RRR with murmur. No JVD. Respiratory: CTA Bilaterally. Normal effort  GI: Bowel sounds are normal.  He exhibits no distension.NT, ND Musculoskeletal: He exhibits tenderness. He exhibits mild edema.  Neurological: He is alertand oriented.  Motor: B/l UE 5/5 prox to distal.  LLE 4+/5 HF, KE, 4/5 ADF/PF (unchanged, pain inhibition).  RLE: 4+/5  HF, KE and ADF/PF.  Skin: Skin is warmand dry. He is not diaphoretic.  Left TMA with dressing ACE  RLE 5th digit with ulcer Pressure ulcers to b/l ears and nose c/d/i PD sites  dressed Psychiatric: He has a normal mood and affect. His behavior is normal. Judgmentand thought contentnormal.    Assessment/Plan: 1. Functional deficits secondary to left TMA which require 3+ hours per day of interdisciplinary therapy in a comprehensive inpatient rehab setting. Physiatrist is providing close team supervision and 24 hour management of active medical problems listed below. Physiatrist and rehab team continue to assess barriers to discharge/monitor patient progress toward functional and medical goals.  Function:  Bathing Bathing position   Position: Wheelchair/chair at sink  Bathing parts Body parts bathed by patient: Left arm, Right arm, Chest, Abdomen, Right upper leg, Left upper leg, Right lower leg Body parts bathed by helper: Front perineal area, Buttocks, Back  Bathing assist Assist Level: Touching or steadying assistance(Pt > 75%)      Upper Body Dressing/Undressing Upper body dressing   What is the patient wearing?: Pull over shirt/dress     Pull over shirt/dress - Perfomed by patient: Thread/unthread right sleeve, Thread/unthread left sleeve, Put head through opening Pull over shirt/dress - Perfomed by helper: Pull shirt over trunk        Upper body assist Assist Level: Supervision or verbal cues      Lower Body Dressing/Undressing Lower body dressing   What is the patient wearing?: Underwear, Pants, Socks, Shoes Underwear - Performed by patient: Thread/unthread right underwear leg, Thread/unthread left underwear leg Underwear - Performed by helper: Pull underwear up/down Pants- Performed by patient: Thread/unthread right pants leg, Thread/unthread left pants leg Pants- Performed by helper: Pull pants up/down Non-skid slipper socks- Performed by patient: Don/doff right sock Non-skid slipper socks- Performed by helper: Don/doff right sock (n/a left sock) Socks - Performed by patient: Don/doff right sock Socks - Performed by helper: Don/doff right  sock Shoes - Performed by patient: Don/doff right shoe Shoes - Performed by helper: Don/doff left shoe          Lower body assist Assist for lower body dressing: Touching or steadying assistance (Pt > 75%)      Toileting Toileting Toileting activity did not occur: N/A (pt oliguric, dialysis pt)   Toileting steps completed by helper: Adjust clothing prior to toileting, Performs perineal hygiene, Adjust clothing after toileting    Toileting assist Assist level: Two helpers (2 helpers initially, then total assist +1)   Transfers Chair/bed transfer   Chair/bed transfer method: Squat pivot Chair/bed transfer assist level: Moderate assist (Pt 50 - 74%/lift or lower) Chair/bed transfer assistive device: Armrests     Locomotion Ambulation Ambulation activity did not occur: Safety/medical concerns   Max distance: 5' Assist level: Touching or steadying assistance (Pt > 75%)   Wheelchair   Type: Manual Max wheelchair distance: 50 ft Assist Level: Supervision or verbal cues  Cognition Comprehension Comprehension assist level: Understands basic 75 - 89% of the time/ requires cueing 10 - 24% of the time  Expression Expression assist level: Expresses complex ideas: With extra time/assistive device  Social Interaction Social Interaction assist level: Interacts appropriately 75 - 89% of the time - Needs redirection for appropriate language or  to initiate interaction.  Problem Solving Problem solving assist level: Solves basic 75 - 89% of the time/requires cueing 10 - 24% of the time  Memory Memory assist level: Recognizes or recalls 75 - 89% of the time/requires cueing 10 - 24% of the time    Medical Problem List and Plan: 1. Functional and mobility deficits secondary to PAD requiring left TMA, right groin wound, multiple medical  Cont CIR  CT head reviewed 9/24, unremarkable for acute changes 2. DVT Prophylaxis/Anticoagulation: Pharmaceutical: Heparin 3. Pain Management: continue  oxycodone prn.   Pain meds d/ced, no sig pain, no change in MS since meds d/ced4. Mood: team to provide ego support. LCSW to follow for evaluation and support.  5. Neuropsych: This patient iscapable of making decisions on hisown behalf. 6. Skin/Wound Care: dry dressing left foot  Monitor right groin wound continue dry dressing to groin     B/l ears, nose pressure ulcer, likely from CPAP - will pad areas 7. Fluids/Electrolytes/Nutrition: Monitor I/O. PD in evenings after therapy. 8. T2DM:   Used NPH at nights with novolog at home, lantus d/ced Novolog 15 TID started 9/20, d/ced 9/24.   Monitor BS ac/hs.   Continue SSI for elevated BS.   Home NPH dose is 70U qhs,  qHS NPH 40u on 9/22  Started on 20 units AM on 9/24, increased to 40 U on 9/25  Cont to follow, will likely need scheduled mealtime coverage, however, given lability and likely infection, will not make changes today 9. Constipation: Increased colace to home regimen with sorbitol daily prn.  10. OSA: has been compliant with BIPAP at nights.  11. Leucocytosis: Resolved  WBCs 10.2 on 9/26  Cont to monitor 12. Anemia of chronic disease: Continue to monitor.   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 History of CHF: Monitor weight daily--fluid mangedwith PD. On ASA, lipitor, coreg and lasix bid.  (cozaar and lasix on hold for now)   [employer]   01/04/17 0810 01/04/17 1825 01/05/17 0500  Weight: 90.2 kg (198 lb 13.7 oz) 91.3 kg (201 lb 4.5 oz) 87.9 kg (193 lb 12.6 oz)   15. ESRD: On PD daily at nights. Tolerating it well at present 16.  Constipation- senna S 2 po BID, dulcolax this pm 17. Light-headedness/altered balance  -?medication +/- volume related  -lasix and cozaar on hold for now  -encourage fluids  -Likely due to infection  Nitroglycerin added by Dr. Lajoyce Corners- ? Ortho Hypotension- check ortho BPs 18. HTN   Vitals:   01/04/17 2122 01/05/17 0546  BP: (!) 155/70  (!) 132/53  Pulse: (!) 104 (!) 105  Resp:  20  Temp:  99.4 F (37.4 C)  SpO2:  95%    Slightly labile, overall controlled 9/27 19. Hallucinations resolved , now has episodes of decreased responsiveness that spontaneously resolve  I  Ucx NG    Blood CXs pending  Neg Xray of foot,  Cont IV abx Ceftazidine  LOS (Days) 9 A FACE TO FACE EVALUATION WAS PERFORMED  Michole Lecuyer E 01/05/2017 6:45 AM

## 2017-01-05 NOTE — Plan of Care (Signed)
Problem: RH SKIN INTEGRITY Goal: RH STG MAINTAIN SKIN INTEGRITY WITH ASSISTANCE STG Maintain Skin Integrity With mod Assistance.  Outcome: Progressing  01/05/17 0225  Skin Integrity Goals  STG: Maintain skin integrity with assistance 3-Moderate assistance   Goal: RH STG ABLE TO PERFORM INCISION/WOUND CARE W/ASSISTANCE STG Able To Perform Incision/Wound Care With total Assistance from caregiver.  Outcome: Progressing  01/05/17 0225  Skin Integrity Goals  STG: Pt will be able to perform incision/wound care with assistance 1-Total assistance

## 2017-01-05 NOTE — Progress Notes (Signed)
Physical Therapy Note  Patient Details  Name: [name] MRN: [id number] Date of Birth: October 29, 1940 Today's Date: 01/05/2017    Time: 1103-1130 27 minutes  1:1 Pt c/o 8/10 pain Lt LE, RN notified,meds given at end of session.  Pt fatigued from previous OT session but agreeable with encouragement.  UBE 2 min fwd/2 min bkwd level 2 with rest break after 2 minutes.  Dowel exercises for UE strengthening with 3# dowel 12 x chest press, shoulder press, circles fwd/bkwd, rowing, bicep curl, shoulder horiz abd/add.  Pt requires frequent rests throughout session due to pain.   DONAWERTH,KAREN 01/05/2017, 11:34 AM

## 2017-01-05 NOTE — Progress Notes (Signed)
Occupational Therapy Session Note  Patient Details  Name: [name] MRN: [id number] Date of Birth: 05/16/1940  Today's Date: 01/05/2017 OT Individual Time: 1003-1102 OT Individual Time Calculation (min): 59 min    Short Term Goals: Week 2:  OT Short Term Goal 1 (Week 2): Continue working on established LTGs set at min assist level.  Skilled Therapeutic Interventions/Progress Updates:    Pt completed bathing and dressing during session.  Still needs greater assist with sit to stand while adhering to weightbearing status during transfers and sit to stand.  Mod assist for standing to wash peri area while maintaining NWBing status through the LLE.  Max assist for standing while attempting clothing management after toileting.  He needed mod demonstrational cueing for technique to thread underpants and pants over feet, starting with the LLE first.  Right knee continues to be weak with standing and will flex with pt demonstrating standing tolerance of only 1 minute or less.  Therapist assisting with holding the LLE out in front with his foot to keep pt from weightbearing. Mr. L[place]o performed squat pivot transfer to and from the [facility] with max assist as well.   Increased fatigue noted at end of session, with pt in wheelchair with PT and daughter-in-law present in room.  .    Therapy Documentation Precautions:  Precautions Precautions: Fall Precaution Comments: PD port Required Braces or Orthoses: Other Brace/Splint Other Brace/Splint: post op shoe Lt  Restrictions Weight Bearing Restrictions: Yes LLE Weight Bearing: Non weight bearing LLE Partial Weight Bearing Percentage or Pounds:  (off loading shoe)  Pain: Pain Assessment Pain Assessment: 0-10 Pain Score: 8  Faces Pain Scale: Hurts little more Pain Type: Acute pain Pain Location: Foot Pain Orientation: Left Pain Descriptors / Indicators: Sharp;Burning Pain Frequency: Constant Pain Onset: On-going Pain Intervention(s):  Medication (See eMAR) ADL: See Function Navigator for Current Functional Status.   Therapy/Group: Individual Therapy  [name],[place] OTR/L 01/05/2017, 12:34 PM

## 2017-01-06 ENCOUNTER — Inpatient Hospital Stay (HOSPITAL_COMMUNITY): Payer: Medicare Other | Admitting: Occupational Therapy

## 2017-01-06 ENCOUNTER — Inpatient Hospital Stay (HOSPITAL_COMMUNITY)
Admission: AD | Admit: 2017-01-06 | Discharge: 2017-01-17 | DRG: 239 | Disposition: A | Discharge status: Home / Self Care | Payer: Medicare Other | Source: Intra-hospital | Attending: Family Medicine | Admitting: Family Medicine

## 2017-01-06 ENCOUNTER — Telehealth (INDEPENDENT_AMBULATORY_CARE_PROVIDER_SITE_OTHER): Payer: Self-pay

## 2017-01-06 ENCOUNTER — Inpatient Hospital Stay (HOSPITAL_COMMUNITY): Payer: Medicare Other | Admitting: Physical Therapy

## 2017-01-06 DIAGNOSIS — R079 Chest pain, unspecified: Secondary | ICD-10-CM

## 2017-01-06 DIAGNOSIS — E1142 Type 2 diabetes mellitus with diabetic polyneuropathy: Secondary | ICD-10-CM | POA: Diagnosis not present

## 2017-01-06 DIAGNOSIS — R740 Nonspecific elevation of levels of transaminase and lactic acid dehydrogenase [LDH]: Secondary | ICD-10-CM

## 2017-01-06 DIAGNOSIS — E871 Hypo-osmolality and hyponatremia: Secondary | ICD-10-CM | POA: Diagnosis present

## 2017-01-06 DIAGNOSIS — E785 Hyperlipidemia, unspecified: Secondary | ICD-10-CM | POA: Diagnosis present

## 2017-01-06 DIAGNOSIS — IMO0001 Reserved for inherently not codable concepts without codable children: Secondary | ICD-10-CM

## 2017-01-06 DIAGNOSIS — G253 Myoclonus: Secondary | ICD-10-CM | POA: Diagnosis present

## 2017-01-06 DIAGNOSIS — E11621 Type 2 diabetes mellitus with foot ulcer: Secondary | ICD-10-CM | POA: Diagnosis present

## 2017-01-06 DIAGNOSIS — J42 Unspecified chronic bronchitis: Secondary | ICD-10-CM | POA: Diagnosis not present

## 2017-01-06 DIAGNOSIS — Z955 Presence of coronary angioplasty implant and graft: Secondary | ICD-10-CM

## 2017-01-06 DIAGNOSIS — Z8673 Personal history of transient ischemic attack (TIA), and cerebral infarction without residual deficits: Secondary | ICD-10-CM

## 2017-01-06 DIAGNOSIS — K5900 Constipation, unspecified: Secondary | ICD-10-CM | POA: Diagnosis present

## 2017-01-06 DIAGNOSIS — Z992 Dependence on renal dialysis: Secondary | ICD-10-CM

## 2017-01-06 DIAGNOSIS — E876 Hypokalemia: Secondary | ICD-10-CM | POA: Diagnosis present

## 2017-01-06 DIAGNOSIS — Z87891 Personal history of nicotine dependence: Secondary | ICD-10-CM | POA: Diagnosis not present

## 2017-01-06 DIAGNOSIS — E669 Obesity, unspecified: Secondary | ICD-10-CM | POA: Diagnosis present

## 2017-01-06 DIAGNOSIS — I5022 Chronic systolic (congestive) heart failure: Secondary | ICD-10-CM | POA: Diagnosis present

## 2017-01-06 DIAGNOSIS — M3500 Sicca syndrome, unspecified: Secondary | ICD-10-CM | POA: Diagnosis present

## 2017-01-06 DIAGNOSIS — L97519 Non-pressure chronic ulcer of other part of right foot with unspecified severity: Secondary | ICD-10-CM | POA: Diagnosis present

## 2017-01-06 DIAGNOSIS — Z23 Encounter for immunization: Secondary | ICD-10-CM | POA: Diagnosis present

## 2017-01-06 DIAGNOSIS — Z89432 Acquired absence of left foot: Secondary | ICD-10-CM | POA: Diagnosis not present

## 2017-01-06 DIAGNOSIS — R5381 Other malaise: Secondary | ICD-10-CM | POA: Diagnosis not present

## 2017-01-06 DIAGNOSIS — Z951 Presence of aortocoronary bypass graft: Secondary | ICD-10-CM | POA: Diagnosis not present

## 2017-01-06 DIAGNOSIS — K824 Cholesterolosis of gallbladder: Secondary | ICD-10-CM | POA: Diagnosis present

## 2017-01-06 DIAGNOSIS — L89813 Pressure ulcer of head, stage 3: Secondary | ICD-10-CM | POA: Diagnosis present

## 2017-01-06 DIAGNOSIS — I25700 Atherosclerosis of coronary artery bypass graft(s), unspecified, with unstable angina pectoris: Secondary | ICD-10-CM | POA: Diagnosis not present

## 2017-01-06 DIAGNOSIS — I25810 Atherosclerosis of coronary artery bypass graft(s) without angina pectoris: Secondary | ICD-10-CM | POA: Diagnosis present

## 2017-01-06 DIAGNOSIS — Z89512 Acquired absence of left leg below knee: Secondary | ICD-10-CM | POA: Diagnosis not present

## 2017-01-06 DIAGNOSIS — J449 Chronic obstructive pulmonary disease, unspecified: Secondary | ICD-10-CM | POA: Diagnosis present

## 2017-01-06 DIAGNOSIS — I214 Non-ST elevation (NSTEMI) myocardial infarction: Secondary | ICD-10-CM | POA: Diagnosis not present

## 2017-01-06 DIAGNOSIS — G4730 Sleep apnea, unspecified: Secondary | ICD-10-CM | POA: Diagnosis present

## 2017-01-06 DIAGNOSIS — K5903 Drug induced constipation: Secondary | ICD-10-CM | POA: Diagnosis not present

## 2017-01-06 DIAGNOSIS — Z4781 Encounter for orthopedic aftercare following surgical amputation: Secondary | ICD-10-CM | POA: Diagnosis present

## 2017-01-06 DIAGNOSIS — S88111S Complete traumatic amputation at level between knee and ankle, right lower leg, sequela: Secondary | ICD-10-CM | POA: Diagnosis not present

## 2017-01-06 DIAGNOSIS — D631 Anemia in chronic kidney disease: Secondary | ICD-10-CM | POA: Diagnosis present

## 2017-01-06 DIAGNOSIS — E1152 Type 2 diabetes mellitus with diabetic peripheral angiopathy with gangrene: Secondary | ICD-10-CM | POA: Diagnosis present

## 2017-01-06 DIAGNOSIS — I252 Old myocardial infarction: Secondary | ICD-10-CM

## 2017-01-06 DIAGNOSIS — N186 End stage renal disease: Secondary | ICD-10-CM | POA: Diagnosis present

## 2017-01-06 DIAGNOSIS — E119 Type 2 diabetes mellitus without complications: Secondary | ICD-10-CM

## 2017-01-06 DIAGNOSIS — L089 Local infection of the skin and subcutaneous tissue, unspecified: Secondary | ICD-10-CM | POA: Diagnosis present

## 2017-01-06 DIAGNOSIS — I200 Unstable angina: Secondary | ICD-10-CM | POA: Diagnosis not present

## 2017-01-06 DIAGNOSIS — I25709 Atherosclerosis of coronary artery bypass graft(s), unspecified, with unspecified angina pectoris: Secondary | ICD-10-CM | POA: Diagnosis not present

## 2017-01-06 DIAGNOSIS — R7401 Elevation of levels of liver transaminase levels: Secondary | ICD-10-CM

## 2017-01-06 DIAGNOSIS — E1165 Type 2 diabetes mellitus with hyperglycemia: Secondary | ICD-10-CM | POA: Diagnosis present

## 2017-01-06 DIAGNOSIS — I255 Ischemic cardiomyopathy: Secondary | ICD-10-CM | POA: Diagnosis present

## 2017-01-06 DIAGNOSIS — I9589 Other hypotension: Secondary | ICD-10-CM | POA: Diagnosis not present

## 2017-01-06 DIAGNOSIS — I509 Heart failure, unspecified: Secondary | ICD-10-CM | POA: Diagnosis present

## 2017-01-06 DIAGNOSIS — E46 Unspecified protein-calorie malnutrition: Secondary | ICD-10-CM | POA: Diagnosis present

## 2017-01-06 DIAGNOSIS — E1122 Type 2 diabetes mellitus with diabetic chronic kidney disease: Secondary | ICD-10-CM | POA: Diagnosis present

## 2017-01-06 DIAGNOSIS — I2510 Atherosclerotic heart disease of native coronary artery without angina pectoris: Secondary | ICD-10-CM | POA: Diagnosis present

## 2017-01-06 DIAGNOSIS — R000 Tachycardia, unspecified: Secondary | ICD-10-CM | POA: Diagnosis not present

## 2017-01-06 DIAGNOSIS — Z95810 Presence of automatic (implantable) cardiac defibrillator: Secondary | ICD-10-CM | POA: Diagnosis present

## 2017-01-06 DIAGNOSIS — I132 Hypertensive heart and chronic kidney disease with heart failure and with stage 5 chronic kidney disease, or end stage renal disease: Secondary | ICD-10-CM | POA: Diagnosis present

## 2017-01-06 DIAGNOSIS — T40605A Adverse effect of unspecified narcotics, initial encounter: Secondary | ICD-10-CM | POA: Diagnosis not present

## 2017-01-06 DIAGNOSIS — I97638 Postprocedural hematoma of a circulatory system organ or structure following other circulatory system procedure: Secondary | ICD-10-CM | POA: Diagnosis present

## 2017-01-06 DIAGNOSIS — Z794 Long term (current) use of insulin: Secondary | ICD-10-CM | POA: Diagnosis not present

## 2017-01-06 DIAGNOSIS — E1151 Type 2 diabetes mellitus with diabetic peripheral angiopathy without gangrene: Secondary | ICD-10-CM | POA: Diagnosis present

## 2017-01-06 DIAGNOSIS — K921 Melena: Secondary | ICD-10-CM | POA: Diagnosis present

## 2017-01-06 DIAGNOSIS — D62 Acute posthemorrhagic anemia: Secondary | ICD-10-CM | POA: Diagnosis present

## 2017-01-06 DIAGNOSIS — Z6828 Body mass index (BMI) 28.0-28.9, adult: Secondary | ICD-10-CM

## 2017-01-06 HISTORY — DX: Local infection of the skin and subcutaneous tissue, unspecified: L08.9

## 2017-01-06 LAB — BASIC METABOLIC PANEL
ANION GAP: 10 (ref 5–15)
BUN: 37 mg/dL — ABNORMAL HIGH (ref 6–20)
CHLORIDE: 91 mmol/L — AB (ref 101–111)
CO2: 27 mmol/L (ref 22–32)
Calcium: 8.3 mg/dL — ABNORMAL LOW (ref 8.9–10.3)
Creatinine, Ser: 7.4 mg/dL — ABNORMAL HIGH (ref 0.61–1.24)
GFR calc non Af Amer: 6 mL/min — ABNORMAL LOW (ref >60)
GFR, EST AFRICAN AMERICAN: 7 mL/min — AB (ref >60)
GLUCOSE: 524 mg/dL — AB (ref 65–99)
Potassium: 4.6 mmol/L (ref 3.5–5.1)
Sodium: 128 mmol/L — ABNORMAL LOW (ref 135–145)

## 2017-01-06 LAB — CREATININE, SERUM
CREATININE: 6.87 mg/dL — AB (ref 0.61–1.24)
GFR calc Af Amer: 8 mL/min — ABNORMAL LOW (ref >60)
GFR calc non Af Amer: 7 mL/min — ABNORMAL LOW (ref >60)

## 2017-01-06 LAB — RENAL FUNCTION PANEL
Albumin: 1.6 g/dL — ABNORMAL LOW (ref 3.5–5.0)
Anion gap: 12 (ref 5–15)
BUN: 53 mg/dL — AB (ref 6–20)
CHLORIDE: 96 mmol/L — AB (ref 101–111)
CO2: 25 mmol/L (ref 22–32)
CREATININE: 6.27 mg/dL — AB (ref 0.61–1.24)
Calcium: 8.4 mg/dL — ABNORMAL LOW (ref 8.9–10.3)
GFR calc Af Amer: 9 mL/min — ABNORMAL LOW (ref >60)
GFR, EST NON AFRICAN AMERICAN: 8 mL/min — AB (ref >60)
Glucose, Bld: 111 mg/dL — ABNORMAL HIGH (ref 65–99)
Phosphorus: 3.6 mg/dL (ref 2.5–4.6)
Potassium: 3.6 mmol/L (ref 3.5–5.1)
Sodium: 133 mmol/L — ABNORMAL LOW (ref 135–145)

## 2017-01-06 LAB — CBC
HCT: 30.3 % — ABNORMAL LOW (ref 39.0–52.0)
HEMATOCRIT: 28.8 % — AB (ref 39.0–52.0)
HEMOGLOBIN: 9.4 g/dL — AB (ref 13.0–17.0)
Hemoglobin: 9.9 g/dL — ABNORMAL LOW (ref 13.0–17.0)
MCH: 31.2 pg (ref 26.0–34.0)
MCH: 30.9 pg (ref 26.0–34.0)
MCHC: 32.7 g/dL (ref 30.0–36.0)
MCHC: 32.6 g/dL (ref 30.0–36.0)
MCV: 95.6 fL (ref 78.0–100.0)
MCV: 94.7 fL (ref 78.0–100.0)
PLATELETS: 389 10*3/uL (ref 150–400)
Platelets: 425 10*3/uL — ABNORMAL HIGH (ref 150–400)
RBC: 3.17 MIL/uL — ABNORMAL LOW (ref 4.22–5.81)
RBC: 3.04 MIL/uL — AB (ref 4.22–5.81)
RDW: 14.9 % (ref 11.5–15.5)
RDW: 14.4 % (ref 11.5–15.5)
WBC: 10.2 10*3/uL (ref 4.0–10.5)
WBC: 13.3 10*3/uL — ABNORMAL HIGH (ref 4.0–10.5)

## 2017-01-06 LAB — GLUCOSE, CAPILLARY
GLUCOSE-CAPILLARY: 92 mg/dL (ref 65–99)
GLUCOSE-CAPILLARY: 138 mg/dL — AB (ref 65–99)
Glucose-Capillary: 114 mg/dL — ABNORMAL HIGH (ref 65–99)

## 2017-01-06 MED ORDER — MIDODRINE HCL 5 MG PO TABS
5.0000 mg | ORAL_TABLET | Freq: Two times a day (BID) | ORAL | Status: DC
  Administered 2017-01-06: 5 mg via ORAL
  Filled 2017-01-06: qty 1

## 2017-01-06 MED ORDER — NITROGLYCERIN 0.4 MG SL SUBL
0.4000 mg | SUBLINGUAL_TABLET | SUBLINGUAL | Status: DC | PRN
  Administered 2017-01-08 (×3): 0.4 mg via SUBLINGUAL
  Filled 2017-01-06 (×3): qty 1

## 2017-01-06 MED ORDER — DOCUSATE SODIUM 100 MG PO CAPS
100.0000 mg | ORAL_CAPSULE | Freq: Two times a day (BID) | ORAL | Status: DC
  Administered 2017-01-06 – 2017-01-10 (×8): 100 mg via ORAL
  Filled 2017-01-06 (×8): qty 1

## 2017-01-06 MED ORDER — DARBEPOETIN ALFA 100 MCG/0.5ML IJ SOSY: 100.0000 ug | PREFILLED_SYRINGE | INTRAMUSCULAR | Status: DC

## 2017-01-06 MED ORDER — INSULIN NPH (HUMAN) (ISOPHANE) 100 UNIT/ML ~~LOC~~ SUSP
45.0000 [IU] | Freq: Every day | SUBCUTANEOUS | Status: DC
  Administered 2017-01-06 – 2017-01-14 (×6): 45 [IU] via SUBCUTANEOUS
  Filled 2017-01-06: qty 10

## 2017-01-06 MED ORDER — DELFLEX-LC/1.5% DEXTROSE 344 MOSM/L IP SOLN: Freq: Every day | INTRAPERITONEAL | Status: DC

## 2017-01-06 MED ORDER — ONDANSETRON HCL 4 MG PO TABS: 4.0000 mg | ORAL_TABLET | Freq: Four times a day (QID) | ORAL | Status: DC | PRN

## 2017-01-06 MED ORDER — DICLOFENAC SODIUM 1 % TD GEL
2.0000 g | Freq: Four times a day (QID) | TRANSDERMAL | Status: DC
  Administered 2017-01-07 – 2017-01-14 (×9): 2 g via TOPICAL
  Filled 2017-01-06 (×2): qty 100

## 2017-01-06 MED ORDER — NITROGLYCERIN 0.2 MG/HR TD PT24
0.2000 mg | MEDICATED_PATCH | Freq: Every day | TRANSDERMAL | Status: DC
  Administered 2017-01-07 – 2017-01-08 (×3): 0.2 mg via TRANSDERMAL
  Filled 2017-01-06 (×3): qty 1

## 2017-01-06 MED ORDER — DELFLEX-LC/2.5% DEXTROSE 394 MOSM/L IP SOLN: Freq: Every day | INTRAPERITONEAL | Status: DC

## 2017-01-06 MED ORDER — INSULIN ASPART 100 UNIT/ML ~~LOC~~ SOLN
0.0000 [IU] | Freq: Three times a day (TID) | SUBCUTANEOUS | Status: DC
  Administered 2017-01-07: 1 [IU] via SUBCUTANEOUS
  Administered 2017-01-07: 3 [IU] via SUBCUTANEOUS
  Administered 2017-01-08: 5 [IU] via SUBCUTANEOUS
  Administered 2017-01-08: 1 [IU] via SUBCUTANEOUS
  Administered 2017-01-08: 7 [IU] via SUBCUTANEOUS
  Administered 2017-01-10: 2 [IU] via SUBCUTANEOUS
  Administered 2017-01-10: 3 [IU] via SUBCUTANEOUS
  Administered 2017-01-11: 5 [IU] via SUBCUTANEOUS
  Administered 2017-01-12: 7 [IU] via SUBCUTANEOUS
  Administered 2017-01-12: 3 [IU] via SUBCUTANEOUS
  Administered 2017-01-12: 7 [IU] via SUBCUTANEOUS
  Administered 2017-01-13: 2 [IU] via SUBCUTANEOUS
  Administered 2017-01-13: 9 [IU] via SUBCUTANEOUS
  Administered 2017-01-13: 5 [IU] via SUBCUTANEOUS
  Administered 2017-01-14: 3 [IU] via SUBCUTANEOUS
  Administered 2017-01-14: 7 [IU] via SUBCUTANEOUS
  Administered 2017-01-15 (×2): 3 [IU] via SUBCUTANEOUS
  Administered 2017-01-15 – 2017-01-16 (×2): 2 [IU] via SUBCUTANEOUS
  Administered 2017-01-16: 5 [IU] via SUBCUTANEOUS
  Administered 2017-01-16: 7 [IU] via SUBCUTANEOUS
  Administered 2017-01-17: 9 [IU] via SUBCUTANEOUS
  Administered 2017-01-17: 7 [IU] via SUBCUTANEOUS

## 2017-01-06 MED ORDER — ASPIRIN EC 81 MG PO TBEC
81.0000 mg | DELAYED_RELEASE_TABLET | Freq: Every day | ORAL | Status: DC
  Administered 2017-01-07 – 2017-01-08 (×2): 81 mg via ORAL
  Filled 2017-01-06 (×2): qty 1

## 2017-01-06 MED ORDER — CARVEDILOL 12.5 MG PO TABS
12.5000 mg | ORAL_TABLET | Freq: Every day | ORAL | Status: DC
  Administered 2017-01-06 – 2017-01-16 (×10): 12.5 mg via ORAL
  Filled 2017-01-06 (×10): qty 1

## 2017-01-06 MED ORDER — DICLOFENAC SODIUM 1 % TD GEL
2.0000 g | Freq: Four times a day (QID) | TRANSDERMAL | Status: DC
  Administered 2017-01-06 (×2): 2 g via TOPICAL
  Filled 2017-01-06: qty 100

## 2017-01-06 MED ORDER — INSULIN NPH (HUMAN) (ISOPHANE) 100 UNIT/ML ~~LOC~~ SUSP: 45.0000 [IU] | Freq: Every day | SUBCUTANEOUS | Status: DC

## 2017-01-06 MED ORDER — NEPRO/CARBSTEADY PO LIQD
237.0000 mL | Freq: Two times a day (BID) | ORAL | Status: DC
  Administered 2017-01-07 – 2017-01-17 (×12): 237 mL via ORAL
  Filled 2017-01-06 (×20): qty 237

## 2017-01-06 MED ORDER — ATORVASTATIN CALCIUM 80 MG PO TABS: 80.0000 mg | ORAL_TABLET | Freq: Every day | ORAL | Status: DC

## 2017-01-06 MED ORDER — SODIUM CHLORIDE 0.9% FLUSH: 3.0000 mL | INTRAVENOUS | Status: DC | PRN

## 2017-01-06 MED ORDER — SODIUM CHLORIDE 0.9% FLUSH
3.0000 mL | Freq: Two times a day (BID) | INTRAVENOUS | Status: DC
  Administered 2017-01-06 – 2017-01-16 (×14): 3 mL via INTRAVENOUS

## 2017-01-06 MED ORDER — TROLAMINE SALICYLATE 10 % EX CREA: TOPICAL_CREAM | Freq: Three times a day (TID) | CUTANEOUS | Status: DC

## 2017-01-06 MED ORDER — MUSCLE RUB 10-15 % EX CREA
1.0000 "application " | TOPICAL_CREAM | Freq: Four times a day (QID) | CUTANEOUS | Status: DC
  Administered 2017-01-06: 1 via TOPICAL
  Filled 2017-01-06: qty 85

## 2017-01-06 MED ORDER — RENA-VITE PO TABS
1.0000 | ORAL_TABLET | Freq: Every day | ORAL | Status: DC
  Administered 2017-01-06 – 2017-01-16 (×11): 1 via ORAL
  Filled 2017-01-06 (×12): qty 1

## 2017-01-06 MED ORDER — SODIUM CHLORIDE 0.9 % IV SOLN
500.0000 mg | INTRAVENOUS | Status: DC
  Administered 2017-01-06 – 2017-01-10 (×3): 500 mg via INTRAVENOUS
  Filled 2017-01-06 (×6): qty 10

## 2017-01-06 MED ORDER — OXYCODONE-ACETAMINOPHEN 5-325 MG PO TABS
1.0000 | ORAL_TABLET | Freq: Four times a day (QID) | ORAL | Status: DC | PRN
  Administered 2017-01-06 – 2017-01-15 (×14): 1 via ORAL
  Filled 2017-01-06 (×16): qty 1

## 2017-01-06 MED ORDER — HEPARIN 1000 UNIT/ML FOR PERITONEAL DIALYSIS: 500.0000 [IU] | INTRAMUSCULAR | Status: DC | PRN

## 2017-01-06 MED ORDER — CELECOXIB 100 MG PO CAPS
100.0000 mg | ORAL_CAPSULE | Freq: Every day | ORAL | Status: DC
  Administered 2017-01-06: 100 mg via ORAL
  Filled 2017-01-06: qty 1

## 2017-01-06 MED ORDER — ACETAMINOPHEN 325 MG PO TABS
650.0000 mg | ORAL_TABLET | Freq: Four times a day (QID) | ORAL | Status: DC | PRN
  Administered 2017-01-07 – 2017-01-08 (×3): 650 mg via ORAL
  Filled 2017-01-06 (×3): qty 2

## 2017-01-06 MED ORDER — VITAMIN D (ERGOCALCIFEROL) 1.25 MG (50000 UNIT) PO CAPS
50000.0000 [IU] | ORAL_CAPSULE | ORAL | Status: DC
  Administered 2017-01-07: 50000 [IU] via ORAL
  Filled 2017-01-06: qty 1

## 2017-01-06 MED ORDER — DEXTROSE 5 % IV SOLN
500.0000 mg | INTRAVENOUS | Status: DC
  Administered 2017-01-06 – 2017-01-11 (×6): 500 mg via INTRAVENOUS
  Filled 2017-01-06 (×6): qty 0.5

## 2017-01-06 MED ORDER — HEPARIN SODIUM (PORCINE) 5000 UNIT/ML IJ SOLN
5000.0000 [IU] | Freq: Two times a day (BID) | INTRAMUSCULAR | Status: DC
  Administered 2017-01-06 – 2017-01-08 (×4): 5000 [IU] via SUBCUTANEOUS
  Filled 2017-01-06 (×4): qty 1

## 2017-01-06 MED ORDER — CLOPIDOGREL BISULFATE 75 MG PO TABS
75.0000 mg | ORAL_TABLET | Freq: Every day | ORAL | Status: DC
  Administered 2017-01-06 – 2017-01-09 (×4): 75 mg via ORAL
  Filled 2017-01-06 (×4): qty 1

## 2017-01-06 MED ORDER — MIDODRINE HCL 5 MG PO TABS
5.0000 mg | ORAL_TABLET | Freq: Two times a day (BID) | ORAL | Status: DC
  Administered 2017-01-07 – 2017-01-17 (×20): 5 mg via ORAL
  Filled 2017-01-06 (×20): qty 1

## 2017-01-06 MED ORDER — GENTAMICIN SULFATE 0.1 % EX CREA
TOPICAL_CREAM | Freq: Three times a day (TID) | CUTANEOUS | Status: DC
  Administered 2017-01-06 – 2017-01-09 (×8): via TOPICAL
  Administered 2017-01-09: 1 via TOPICAL
  Administered 2017-01-09 – 2017-01-11 (×6): via TOPICAL
  Filled 2017-01-06: qty 15

## 2017-01-06 MED ORDER — INSULIN NPH (HUMAN) (ISOPHANE) 100 UNIT/ML ~~LOC~~ SUSP
40.0000 [IU] | Freq: Every day | SUBCUTANEOUS | Status: DC
  Administered 2017-01-07 – 2017-01-14 (×7): 40 [IU] via SUBCUTANEOUS
  Filled 2017-01-06: qty 10

## 2017-01-06 MED ORDER — CALCIUM ACETATE (PHOS BINDER) 667 MG PO CAPS
667.0000 mg | ORAL_CAPSULE | Freq: Three times a day (TID) | ORAL | Status: DC
  Administered 2017-01-07 – 2017-01-17 (×25): 667 mg via ORAL
  Filled 2017-01-06 (×28): qty 1

## 2017-01-06 MED ORDER — CELECOXIB 100 MG PO CAPS
100.0000 mg | ORAL_CAPSULE | Freq: Every day | ORAL | Status: DC
  Administered 2017-01-06 – 2017-01-10 (×5): 100 mg via ORAL
  Filled 2017-01-06 (×6): qty 1

## 2017-01-06 MED ORDER — ISOSORBIDE MONONITRATE ER 30 MG PO TB24
30.0000 mg | ORAL_TABLET | Freq: Every day | ORAL | Status: DC
  Administered 2017-01-06 – 2017-01-08 (×3): 30 mg via ORAL
  Filled 2017-01-06 (×3): qty 1

## 2017-01-06 MED ORDER — SODIUM CHLORIDE 0.9 % IV SOLN
250.0000 mL | INTRAVENOUS | Status: DC | PRN
  Administered 2017-01-08: 250 mL via INTRAVENOUS

## 2017-01-06 MED ORDER — SENNA 8.6 MG PO TABS
1.0000 | ORAL_TABLET | Freq: Two times a day (BID) | ORAL | Status: DC
  Administered 2017-01-06 – 2017-01-16 (×18): 8.6 mg via ORAL
  Filled 2017-01-06 (×18): qty 1

## 2017-01-06 MED ORDER — ACETAMINOPHEN 650 MG RE SUPP: 650.0000 mg | Freq: Four times a day (QID) | RECTAL | Status: DC | PRN

## 2017-01-06 NOTE — Progress Notes (Signed)
Social Work Patient ID: [name], male   DOB: [birth year], 76 y.o.   MRN: [id number] Per PA-Pam pt is transferring off to acute due to medical issues. Insurance aware and pt has not completed his rehab program So may need to come back once medical issues resolved. Team aware of this plan.

## 2017-01-06 NOTE — Telephone Encounter (Signed)
Nurse was calling for Dr. Lajoyce Corners concerning patient.

## 2017-01-06 NOTE — Progress Notes (Signed)
Patient reporting pain in left foot and leg is uncontrolled. He reports that he can't live this way and wants something done. He is unable to tolerate any pain meds due to SE.  Left foot dressing changed today and shows breakdown on inferior aspect.

## 2017-01-06 NOTE — Progress Notes (Signed)
Pt transferred with belongings to room 5w10. Wife at bedside. Pt stable, VS WNL.

## 2017-01-06 NOTE — Discharge Summary (Signed)
Physician Discharge Summary  Patient ID: [name] MRN: [id number] DOB/AGE: 1941/02/15 76 y.o.  Admit date: 12/27/2016 Discharge date: 01/06/2017  Discharge Diagnoses:  Principal Problem:   Debility Active Problems:   ESRD on peritoneal dialysis [facility])   History of transmetatarsal amputation of left foot (HCC)   Uncontrolled diabetes mellitus type 2 with peripheral artery disease (HCC)   Diabetic peripheral neuropathy (HCC)   Anemia of chronic disease   Chronic congestive heart failure (HCC)   Labile blood pressure   Benign essential HTN   Ulcer of toe of right foot (HCC)   Ulcer of external ear, limited to breakdown of skin (HCC)   Discharged Condition:  Stable   Significant Diagnostic Studies: Ct Head Wo Contrast  Result Date: 01/02/2017 CLINICAL DATA:  Ataxia, difficulty standing, hallucinations, altered mental status. EXAM: CT HEAD WITHOUT CONTRAST TECHNIQUE: Contiguous axial images were obtained from the base of the skull through the vertex without intravenous contrast. COMPARISON:  None. FINDINGS: Brain: No evidence of an acute infarct, acute hemorrhage, mass lesion, mass effect or hydrocephalus. Atrophy and periventricular low attenuation. Vascular: No hyperdense vessel or unexpected calcification. Skull: Normal. Negative for fracture or focal lesion. Sinuses/Orbits: No acute finding. Other: None. IMPRESSION: 1. No acute intracranial abnormality. 2. Atrophy and chronic microvascular white matter ischemic changes. Electronically Signed   By: Leanna Battles M.D.   On: 01/02/2017 16:34    Dg Chest Port 1 View  Result Date: 01/03/2017 CLINICAL DATA:  Fever this evening EXAM: PORTABLE CHEST 1 VIEW COMPARISON:  09/21/2015 FINDINGS: Status post CABG with borderline cardiomegaly and aortic atherosclerosis. Epicardial pacing leads are again seen overlying the right and left heart with new defibrillator like device partially imaged along the periphery of the left hemithorax with  lead projecting up to the level of the aortic arch. Lungs are clear. No acute nor suspicious osseous abnormalities. IMPRESSION: Status post CABG with aortic atherosclerosis. No acute pneumonic consolidation. Electronically Signed   By: Tollie Eth M.D.   On: 01/03/2017 23:21    Dg Foot Complete Left  Result Date: 01/04/2017 CLINICAL DATA:  Pain.  Status post midfoot amputation EXAM: LEFT FOOT - COMPLETE 3+ VIEW COMPARISON:  November 25, 2016 FINDINGS: Frontal, oblique, and lateral views were obtained. There has been amputation at the levels of the proximal metatarsals. There is soft tissue air in the stump region, presumably of postoperative etiology. There is no well-defined soft tissue abscess by radiography. Remaining bony structures appear intact without fracture or dislocation. No erosive change or bony destruction. There is extensive arterial vascular calcification. 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 No bony abnormality noted currently. Soft tissue air noted in the stump region, likely of postoperative etiology. A degree of underlying infection in this area cannot be excluded radiographically. Multiple foci of arterial vascular calcification noted. Electronically Signed   By: Bretta Bang III M.D.   On: 01/04/2017 10:20    Labs:  Basic Metabolic Panel:  Recent Labs Lab 12/31/16 0602 01/01/17 0538 01/02/17 0756 01/03/17 1223 01/04/17 0558 01/06/17 0526  NA 128* 127* 127* 130* 130* 133*  K 4.4 4.8 5.0 4.6 4.5 3.6  CL 91* 90* 90* 95* 91* 96*  CO2 GLUCOSE 315* 258* 383* 78 400* 111*  BUN 39* 45* 47* 49* 45* 53*  CREATININE 7.04* 7.00* 7.11* 6.76* 6.40* 6.27*  CALCIUM 8.1* 8.0* 8.3* 8.6* 8.4* 8.4*  PHOS  --   --   --  3.7 4.3 3.6    CBC:  Recent Labs Lab 01/02/17 0756 01/04/17 0558 01/06/17 0526  WBC 11.5* 10.2 10.2  NEUTROABS 8.7*  --   --   HGB 9.5* 9.4* 9.9*  HCT 29.5* 29.0* 30.3*  MCV 96.1 95.4 95.6  PLT  428* 477* 389    CBG:  Recent Labs Lab 01/05/17 1131 01/05/17 1722 01/05/17 2110 01/06/17 0922 01/06/17 1123  GLUCAP 187* 201* 274* 92 138*    Brief HPI:   [name] a 76 y.o.malewith history of COPD, T2DM, CAD s/p ICD, ESRD- on PD, PAD with critical limb ischemia with non-healing LLE wound who was admitted for right femoral pseudoaneurysm repair and resection of Left 3rd MT head on 12/15/16 by Dr. Randie Heinz. Hospital course complicated by N/V, leucocytosis and fever likely due to sepsis. GB ultrasound revealed GB polyp without acute cholecystitis.  He has had difficulty swallowing due to oral pharyngeal ulcers and Dr. Pollyann Kennedy recommended supportive care for management. He has had issues with right groin wound dehiscence of wound with gangrenous changes left foot. 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 Lajoyce Corners on 9/14 and to be NWB LLE.  Right groin with small hematoma and decrease in erythema. CIR recommended due to deficits in mobility and decreased ability to carry out ADL tasks.    Hospital Course: [name] was admitted to rehab 12/27/2016 for inpatient therapies to consist of PT and OT at least three hours five days a week. Past admission physiatrist, therapy team and rehab RN have worked together to provide customized collaborative inpatient rehab. Respiratory status has been stable and he has been compliant with CPAP use. Right groin wound is intact, healing well with continued decrease in hematoma. His blood pressures have been monitored on bid basis and he was noted to have hypotensive episodes with fatigue and frequently during therapy sessions.  Coreg was decreased to once a day in evenings. Nephrology has been following for management of PD and has decreased amount of fluid removed with PD. Midodrine was added on 9/28 to help with BP support. Po intake has improved but continues to vary depending on fatigue and hypotension. Oral pain has with  Nausea and vomiting have resolved. Diabetes has been monitored with ac/hs cbg checks and he was transitioned back to NPH with novolog meal coverage. Insulin has been titrated upwards slowly to prevent hypoglycemic episodes as blood sugars have been difficulty to control.   He reported  left foot pain due to neuropathy and was started on 100 mg gabapentin but this was discontinued due to tremors. He has developed right 5th toe ulcer with has black eschar and vascular recommends daily dry dressing with monitoring at this time. Patient developed myoclonus with confusion with hallucinations on 9/23 and gabapentin, oxycodone and robaxin were discontinued. CT head was done for work up and was negative for acute changes. He continues to have leucocytosis and developed tachycardia with fever of 100.7 on 9/25 pm. He was pan-cultured and started on Fortaz due to concerns of wound infection and/or peritonitis. Urine culture is negative and blood cultures are negative so far. CXR was negative for infection. He continues to have intermittent low grade fevers and white count is down to  10.2.   Wound VAC was discontinued and left transmetatarsal site was noted to have mild erythema with areas of ischemia on edges.   Dr. Lajoyce Corners was consulted for input and recommended monitoring but patient has had increase in pain in last  24 hours. Left transmetatarsal wound is showing increase in ischemia with dark eschar along incision line, blistering on inferior aspect and minimal serous drainage along wound edges. Due to intolerance of narcotics, he was cleared to tolerate Celebrex for pain control. Dr.Duda plans on evaluation and further surgical intervention next week.  He has developed pressure areas on his nasal bridge and ear lobes due to pressure from CPAP and this is treated with bacitracin.  He has had issues with activity tolerance affecting therapy. Due to recurrent hypotension with decline in wound and concerns of sepsis he was  discharged to acute floor for further work up and closer monitoring.    Rehab course: During patient's stay in rehab weekly team conferences were held to monitor patient's progress, set goals and discuss barriers to discharge. At admission, patient required max assist with basic self care tasks and mod assist with mobility. His progress has fluctuated during his rehab stay due to multiple medical issues as well as pain. He requires mod to max assist for sit to stand with LB self care tasks. He is able to propel his wheelchair 94' with multiple rest breaks and increased time. He is able to perform bed mobility with supervision. He is able to perform SB transfers with min assist and elevation of LLE to maintain NWB.      Current Medications:  . aspirin EC  81 mg Oral Daily  . atorvastatin  80 mg Oral q1800  . calcium acetate  667 mg Oral TID WC  . carvedilol  12.5 mg Oral QHS  . celecoxib  100 mg Oral Daily  . clopidogrel  75 mg Oral Daily  . darbepoetin (ARANESP) injection - DIALYSIS  100 mcg Subcutaneous Q Thu-1800  . diclofenac sodium  2 g Topical QID  . feeding supplement (NEPRO CARB STEADY)  237 mL Oral TID WC  . feeding supplement (PRO-STAT SUGAR FREE 64)  30 mL Oral BID  . gentamicin cream  1 application Topical Daily  . heparin  5,000 Units Subcutaneous Q8H  . insulin aspart  0-20 Units Subcutaneous TID WC  . insulin aspart  0-5 Units Subcutaneous QHS  . insulin NPH Human  40 Units Subcutaneous QAC breakfast  . insulin NPH Human  45 Units Subcutaneous QHS  . isosorbide mononitrate  30 mg Oral Daily  . mouth rinse  15 mL Mouth Rinse BID  . midodrine  5 mg Oral BID WC  . multivitamin  1 tablet Oral QHS  . MUSCLE RUB  1 application Topical QID  . neomycin-bacitracin-polymyxin   Topical Daily  . nitroGLYCERIN  0.2 mg Transdermal Daily to left shin  . pantoprazole  40 mg Oral Daily  . senna-docusate  2 tablet Oral BID  . [START ON 01/08/2017] Vitamin D (Ergocalciferol)  50,000 Units  Oral Q30 days    Diet:  Diabetic.  Special Instructions: NO weight on Left foot.   Disposition: Acute hospital.    Signed: Jacquelynn Cree 01/06/2017, 4:38 PM

## 2017-01-06 NOTE — Progress Notes (Addendum)
Discussed patient's wound as well as issues with pain. He is OOT this weekend but will follow up on Monday for further surgical intervention. He did not see a need to do MRI of foot therefore will d/c. Will discuss use of NSAIDS for pain management with Dr. Kathrene Bongo. Add voltaren gel and sportscreme to help with local measures.

## 2017-01-06 NOTE — Progress Notes (Signed)
Pharmacy Antibiotic Note  [name] is a 76 y.o. male with ESRD on CCPD, on D#3 empiric fortaz to cover peritonitis vs. Wound infection. WBC 11.5>10.2, afebrile  - peritoneal fluid cell counts fine per renal note - non-healing LLE wound s/p TMA, foot X-ray - neg for osteo, but wound still look bad, ortho f/u Monday for further surgical intervension  Fortaz 9/25>>  9/25 BCx X2 : sent 9/24 UCx : neg  Plan:  Continue Fortaz 500 mg IV q 24 hr on peritoneal HD. F/u cultures and plan for further surgery.  Height:  (175.3 cm) Weight: 193 lb 12.6 oz (87.9 kg) IBW/kg (Calculated) : 70.7  Temp (24hrs), Avg:98.4 F (36.9 C), Min:98.3 F (36.8 C), Max:98.6 F (37 C)   Recent Labs Lab 12/31/16 0602 01/01/17 0538 01/02/17 0756 01/03/17 1223 01/04/17 0558 01/06/17 0526  WBC 12.2*  --  11.5*  --  10.2 10.2  CREATININE 7.04* 7.00* 7.11* 6.76* 6.40* 6.27*    Estimated Creatinine Clearance: 11.2 mL/min (A) (by C-G formula based on SCr of 6.27 mg/dL (H)).    Allergies  Allergen Reactions  . Lisinopril Hives  . Methotrexate Derivatives Other (See Comments)    blisters  . Sulfa Antibiotics Hives  . Tape Hives  . Vancomycin Other (See Comments)    Blisters   . Zanaflex [Tizanidine Hcl]     Hypotensive stroke     Thank you for allowing pharmacy to be a part of this patient's care.  Bayard Hugger, PharmD, BCPS  Clinical Pharmacist  Pager: [phone]   01/06/2017 2:39 PM

## 2017-01-06 NOTE — Progress Notes (Signed)
Occupational Therapy Session Note  Patient Details  Name: [name] MRN: [id number] Date of Birth: 1940-09-09  Today's Date: 01/06/2017 OT Individual Time: 5825-1898 OT Individual Time Calculation (min): 41 min    Short Term Goals: Week 1:  OT Short Term Goal 1 (Week 1): Pt will transfer to toilet with min A  OT Short Term Goal 1 - Progress (Week 1): Not met OT Short Term Goal 2 (Week 1): Pt will perform sit to stand with min A in prep for clothing management while maintaining weight bearing precautions OT Short Term Goal 2 - Progress (Week 1): Not met OT Short Term Goal 3 (Week 1): Pt wil perform bed mobilty without bed rails with supervision/ extra time OT Short Term Goal 3 - Progress (Week 1): Met OT Short Term Goal 4 (Week 1): Pt will navigate w/c around room to obtain clothing for ADL with min A  OT Short Term Goal 4 - Progress (Week 1): Met OT Short Term Goal 5 (Week 1): Pt will perform 3/3 grooming tasks with setup OT Short Term Goal 5 - Progress (Week 1): Met  Skilled Therapeutic Interventions/Progress Updates:    Pt completed bathing and dressing sitting EOB during session.  He was able to perform all UB bathing and dressing with supervision sitting unsupported.  Min assist for lateral leans side to side to remove dirty clothing and brief with min assist.  He was then able to wash his peri area with min assist.  Mod assist for pulling items over his hips with lateral leans when getting dressed.  He finished session by donning regular sock over the right foot with setup before transitioning to supine with supervision as well.    Therapy Documentation Precautions:  Precautions Precautions: Fall Precaution Comments: PD port Required Braces or Orthoses: Other Brace/Splint Other Brace/Splint: post op shoe Lt  Restrictions Weight Bearing Restrictions: Yes LLE Weight Bearing: Non weight bearing LLE Partial Weight Bearing Percentage or Pounds: Darco shoe  Pain: Pain  Assessment Pain Assessment: 0-10 Pain Score: 4  Faces Pain Scale: Hurts little more Pain Type: Neuropathic pain Pain Location: Leg Pain Orientation: Left Pain Radiating Towards:  (ankle to knee) Pain Descriptors / Indicators: Burning;Stabbing;Shooting Pain Frequency: Intermittent Pain Intervention(s): Medication (See eMAR) ADL: See Function Navigator for Current Functional Status.   Therapy/Group: Individual Therapy  [name],[name] OTR/L 01/06/2017, 3:42 PM

## 2017-01-06 NOTE — Progress Notes (Signed)
Occupational Therapy Session Note  Patient Details  Name: [name] MRN: [id number] Date of Birth: October 25, 1940  Today's Date: 01/06/2017 OT Individual Time: 1100-1123 OT Individual Time Calculation (min): 23 min    Short Term Goals: Week 2:  OT Short Term Goal 1 (Week 2): Continue working on established LTGs set at min assist level.  Skilled Therapeutic Interventions/Progress Updates:    Pt in wheelchair to start session, reporting increased dizziness.  BP taken at 84/40 initially and then re-checked at 102/40.  Per nursing request and pt request pt returned to bed via sliding board with min assist using the sliding board in order to keep LLE in non weightbearing.  Pt in bed at end of session with nursing and nurse tech present.  Pt missed 35 mins of session secondary to medical issues.    Therapy Documentation Precautions:  Precautions Precautions: Fall Precaution Comments: PD port Required Braces or Orthoses: Other Brace/Splint Other Brace/Splint: post op shoe Lt  Restrictions Weight Bearing Restrictions: Yes LLE Weight Bearing: Non weight bearing LLE Partial Weight Bearing Percentage or Pounds: Darco shoe General: General OT Amount of Missed Time: 35 Minutes  Pain: Pain Assessment Pain Assessment: Faces Faces Pain Scale: Hurts whole lot Pain Type: Surgical pain Pain Location: Foot Pain Orientation: Left Pain Descriptors / Indicators: Discomfort Pain Intervention(s): Repositioned;Medication (See eMAR) ADL: See Function Navigator for Current Functional Status.   Therapy/Group: Individual Therapy  Mauria Asquith,[place] OTR/L 01/06/2017, 12:43 PM

## 2017-01-06 NOTE — Progress Notes (Signed)
Occupational Therapy Session Note  Patient Details  Name: [name] MRN: [id number] Date of Birth: December 01, 1940  Today's Date: 01/06/2017 OT Individual Time: 9629-5284 OT Individual Time Calculation (min): 31 min   Short Term Goals: Week 2:  OT Short Term Goal 1 (Week 2): Continue working on established LTGs set at min assist level.  Skilled Therapeutic Interventions/Progress Updates:    Tx focus on pain mgt and UB strengthening.   Pt greeted supine in bed with RN present wrapping L LE. Per RN/pt, there was increased bleeding from amputation site. Pt with report of increased pain, requesting for light tx. Pt transitioned to EOB with supervision, bed slightly elevated for NWB L LE. Pt instructed on bilateral UE exercises with use of orange theraband, including elbow flexion, tricep punches, diagonals, and shoulder flexion x15 reps. Had him listen to [facility] singers for pain mgt. Educated him/spouse on using music therapeutically for pain/psychosocial health during hospital stay with verbalized understanding.  At end of tx pt transitioned back to supine, cues to only weightbear through R LE to reposition himself. Pt left with all needs and spouse present at time of departure.   Therapy Documentation Precautions:  Precautions Precautions: Fall Precaution Comments: PD port Required Braces or Orthoses: Other Brace/Splint Other Brace/Splint: post op shoe Lt  Restrictions Weight Bearing Restrictions: Yes LLE Weight Bearing: Non weight bearing LLE Partial Weight Bearing Percentage or Pounds: Darco shoe General: General OT Amount of Missed Time: 35 Minutes  Pain: Pain Assessment Pain Assessment: 0-10 Pain Score: 4  Faces Pain Scale: Hurts little more Pain Type: Neuropathic pain Pain Location: Leg Pain Orientation: Left Pain Radiating Towards:  (ankle to knee) Pain Descriptors / Indicators: Burning;Stabbing;Shooting Pain Frequency: Intermittent Pain Intervention(s):  Medication (See eMAR) ADL: ADL ADL Comments: see functional navigator     See Function Navigator for Current Functional Status.   Therapy/Group: Individual Therapy  Simuel Stebner A Rich Paprocki 01/06/2017, 4:04 PM

## 2017-01-06 NOTE — Progress Notes (Addendum)
Pharmacy Antibiotic Note  [name] is a 76 y.o. male with ESRD on CCPD, on D#3 empiric fortaz to cover peritonitis vs. Wound infection. WBC 11.5>10.2, afebrile  - peritoneal fluid cell counts fine per renal note - non-healing LLE wound s/p TMA 12/23/16, foot X-ray - neg for osteo, but wound still look bad, ortho f/u Monday for further surgical intervention.    Tonight 01/06/17 patient transferred out of CIR, readmitted to [facility] to add Daptomycin (pt allergic to Vancomycin)  [name] 9/25>>  9/25 BCx X2 : ngtd 9/24 UCx : neg  Plan:  Add Daptomycin ~ /kg =  IV q48h Continue Fortaz 500 mg IV q 24 hr on peritoneal HD. F/u cultures and plan for further surgery.     Temp (24hrs), Avg:98.2 F (36.8 C), Min:97.8 F (36.6 C), Max:98.6 F (37 C)   Recent Labs Lab 12/31/16 0602 01/01/17 0538 01/02/17 0756 01/03/17 1223 01/04/17 0558 01/06/17 0526  WBC 12.2*  --  11.5*  --  10.2 10.2  CREATININE 7.04* 7.00* 7.11* 6.76* 6.40* 6.27*    Estimated Creatinine Clearance: 11.2 mL/min (A) (by C-G formula based on SCr of 6.27 mg/dL (H)).    Allergies  Allergen Reactions  . Lisinopril Hives  . Methotrexate Derivatives Other (See Comments)    blisters  . Sulfa Antibiotics Hives  . Tape Hives  . Vancomycin Other (See Comments)    Blisters   . Zanaflex [Tizanidine Hcl]     Hypotensive stroke     Thank you for allowing pharmacy to be a part of this patient's care.  [name], RPh Clinical Pharmacist  ADDENDUM:  Aranesp per pharmacy consult for anemia of chronic kidney disease.  Patient was on Aranesp SQ qThursday, last given on 01/05/17.  Hgb 9.4  Plan: Resumed Aranesp 100 mcg Sq qThursday, next due on 01/12/17.  Will discontinue the Aranesp pharmacy consult.     Thank you for allowing pharmacy to be part of this patients care team. [name], RPh Clinical Pharmacist [phone] 01/06/2017 7:14 PM

## 2017-01-06 NOTE — Progress Notes (Signed)
[place] PHYSICAL MEDICINE & REHABILITATION     PROGRESS NOTE  Subjective/Complaints:   No issues overnite  ROS: Denies nausea, vomiting, diarrhea, shortness of breath or chest pain   Objective: Vital Signs: Blood pressure (!) 154/56, pulse (!) 101, temperature 98.6 F (37 C), temperature source Oral, resp. rate 18, height  (1.753 m), weight 87.9 kg (193 lb 12.6 oz), SpO2 100 %. Dg Foot Complete Left  Result Date: 01/04/2017 CLINICAL DATA:  Pain.  Status post midfoot amputation EXAM: LEFT FOOT - COMPLETE 3+ VIEW COMPARISON:  November 25, 2016 FINDINGS: Frontal, oblique, and lateral views were obtained. There has been amputation at the levels of the proximal metatarsals. There is soft tissue air in the stump region, presumably of postoperative etiology. There is no well-defined soft tissue abscess by radiography. Remaining bony structures appear intact without fracture or dislocation. No erosive change or bony destruction. There is extensive arterial vascular calcification. 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 No bony abnormality noted currently. Soft tissue air noted in the stump region, likely of postoperative etiology. A degree of underlying infection in this area cannot be excluded radiographically. Multiple foci of arterial vascular calcification noted. Electronically Signed   By: Bretta Bang III M.D.   On: 01/04/2017 10:20    Recent Labs  01/04/17 0558 01/06/17 0526  WBC 10.2 10.2  HGB 9.4* 9.9*  HCT 29.0* 30.3*  PLT 477* 389    Recent Labs  01/04/17 0558 01/06/17 0526  NA 130* 133*  K 4.5 3.6  CL 91* 96*  GLUCOSE 400* 111*  BUN 45* 53*  CREATININE 6.40* 6.27*  CALCIUM 8.4* 8.4*   CBG (last 3)   Recent Labs  01/05/17 1131 01/05/17 1722 01/05/17 2110  GLUCAP 187* 201* 274*    Wt Readings from Last 3 Encounters:  01/05/17 87.9 kg (193 lb 12.6 oz)  12/27/16 89.2 kg (196 lb 10.4 oz)  12/14/16 92.1 kg (203 lb)     Physical Exam:  BP (!) 154/56 (BP Location: Right Arm)   Pulse (!) 101   Temp 98.6 F (37 C) (Oral)   Resp 18   Ht  (1.753 m)   Wt 87.9 kg (193 lb 12.6 oz)   SpO2 100%   BMI 28.62 kg/m  Constitutional: He appears well-developedand well-nourished. No distress.  HENT: Normocephalicand atraumatic.  Eyes: EOMare normal. No discharge. Cardiovascular: RRR with murmur. No JVD. Respiratory: CTA Bilaterally. Normal effort  GI: Bowel sounds are normal. He exhibits no distension.NT, ND Musculoskeletal: He exhibits tenderness. He exhibits mild edema.  Neurological: He is alertand oriented.  Motor: B/l UE 5/5 prox to distal.  LLE 4+/5 HF, KE, 4/5 ADF/PF (unchanged, pain inhibition).  RLE: 4+/5  HF, KE and ADF/PF.  Skin: Skin is warmand dry. He is not diaphoretic.  Left TMA with dressing ACE  RLE 5th digit with ulcer Pressure ulcers to b/l ears and nose c/d/i PD sites dressed Psychiatric: He has a normal mood and affect. His behavior is normal. Judgmentand thought contentnormal.    Assessment/Plan: 1. Functional deficits secondary to left TMA which require 3+ hours per day of interdisciplinary therapy in a comprehensive inpatient rehab setting. Physiatrist is providing close team supervision and 24 hour management of active medical problems listed below. Physiatrist and rehab team continue to assess barriers to discharge/monitor patient progress toward functional and medical goals.  Function:  Bathing Bathing position   Position: Wheelchair/chair at sink  Bathing parts Body parts bathed by  patient: Left arm, Right arm, Chest, Abdomen, Right upper leg, Left upper leg, Right lower leg Body parts bathed by helper: Front perineal area, Buttocks, Left lower leg, Back  Bathing assist Assist Level: Touching or steadying assistance(Pt > 75%)      Upper Body Dressing/Undressing Upper body dressing   What is the patient wearing?: Pull over shirt/dress     Pull over  shirt/dress - Perfomed by patient: Thread/unthread right sleeve, Thread/unthread left sleeve, Put head through opening Pull over shirt/dress - Perfomed by helper: Pull shirt over trunk        Upper body assist Assist Level: Supervision or verbal cues      Lower Body Dressing/Undressing Lower body dressing   What is the patient wearing?: Underwear, Pants, Socks, Shoes Underwear - Performed by patient: Thread/unthread right underwear leg, Thread/unthread left underwear leg Underwear - Performed by helper: Pull underwear up/down Pants- Performed by patient: Thread/unthread right pants leg, Thread/unthread left pants leg Pants- Performed by helper: Pull pants up/down Non-skid slipper socks- Performed by patient: Don/doff right sock Non-skid slipper socks- Performed by helper: Don/doff right sock (n/a left sock) Socks - Performed by patient: Don/doff right sock Socks - Performed by helper: Don/doff right sock Shoes - Performed by patient: Don/doff right shoe Shoes - Performed by helper: Don/doff left shoe          Lower body assist Assist for lower body dressing: Touching or steadying assistance (Pt > 75%)      Toileting Toileting Toileting activity did not occur: N/A (pt oliguric, dialysis pt) Toileting steps completed by patient: Adjust clothing prior to toileting, Performs perineal hygiene Toileting steps completed by helper: Adjust clothing prior to toileting, Performs perineal hygiene, Adjust clothing after toileting Toileting Assistive Devices: Grab bar or rail, Prosthesis/orthosis  Toileting assist Assist level: Two helpers   Transfers Chair/bed transfer   Chair/bed transfer method: Lateral scoot Chair/bed transfer assist level: Touching or steadying assistance (Pt > 75%) Chair/bed transfer assistive device: Armrests, Sliding board     Locomotion Ambulation Ambulation activity did not occur: Safety/medical concerns   Max distance: 5' Assist level: Touching or steadying  assistance (Pt > 75%)   Wheelchair   Type: Manual Max wheelchair distance: 50 ft Assist Level: Supervision or verbal cues  Cognition Comprehension Comprehension assist level: Follows complex conversation/direction with no assist  Expression Expression assist level: Expresses complex ideas: With extra time/assistive device  Social Interaction Social Interaction assist level: Interacts appropriately with others - No medications needed.  Problem Solving Problem solving assist level: Solves complex problems: Recognizes & self-corrects  Memory Memory assist level: Complete Independence: No helper    Medical Problem List and Plan: 1. Functional and mobility deficits secondary to PAD requiring left TMA, right groin wound, multiple medical  Cont CIR  CT head reviewed 9/24, unremarkable for acute changes 2. DVT Prophylaxis/Anticoagulation: Pharmaceutical: Heparin 3. Pain Management: continue oxycodone prn.   Pain meds d/ced, no sig pain, no change in MS since meds d/ced4. Mood: team to provide ego support. LCSW to follow for evaluation and support.  5. Neuropsych: This patient iscapable of making decisions on hisown behalf. 6. Skin/Wound Care: dry dressing left foot  Monitor right groin wound continue dry dressing to groin     B/l ears, nose pressure ulcer, likely from CPAP - will pad areas 7. Fluids/Electrolytes/Nutrition: Monitor I/O. PD in evenings after therapy. 8. T2DM:   Used NPH at nights with novolog at home, lantus d/ced Novolog 15 TID started 9/20, d/ced 9/24.  Monitor BS ac/hs.   Continue SSI for elevated BS.   Home NPH dose is 70U qhs,    NPH 40 U BID, increase PM dose to 45 U   CBG (last 3)   Recent Labs  01/05/17 1131 01/05/17 1722 01/05/17 2110  GLUCAP 187* 201* 274*    9. Constipation: Increased colace to home regimen with sorbitol daily prn.  10. OSA: has been compliant with BIPAP at nights.  11. Leucocytosis: Resolved  WBCs 10.2 on  9/26  Cont to monitor 12. Anemia of chronic disease: Continue to monitor.   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 History of CHF: Monitor weight daily--fluid mangedwith PD. On ASA, lipitor, coreg and lasix bid.  (cozaar and lasix on hold for now)   [employer]   01/04/17 0810 01/04/17 1825 01/05/17 0500  Weight: 90.2 kg (198 lb 13.7 oz) 91.3 kg (201 lb 4.5 oz) 87.9 kg (193 lb 12.6 oz)   15. ESRD: On PD daily at nights. Tolerating it well at present 16.  Constipation- senna S 2 po BID, dulcolax this pm 17. Light-headedness/altered balance  -resolved  -lasix and cozaar on hold for now  -encourage fluids  -ortho BP lying to sit no sig drop, did not stand  Nitroglycerin added by Dr. Lajoyce Corners- 18. HTN   Vitals:   01/05/17 1443 01/06/17 0445  BP: (!) 118/46 (!) 154/56  Pulse: 65 (!) 101  Resp: 19 18  Temp: 98.3 F (36.8 C) 98.6 F (37 C)  SpO2: 94% 100%    Slightly labile, overall controlled 9/27 19. Hallucinations resolved , now has episodes of decreased responsiveness that spontaneously resolve  I  Ucx NG    Blood CXs pending, NG x 1 day  Neg Xray of foot, MRI pending Cont IV abx Ceftazidine  LOS (Days) 10 A FACE TO FACE EVALUATION WAS PERFORMED  Claudette Laws E 01/06/2017 7:44 AM

## 2017-01-06 NOTE — H&P (Addendum)
[facility] History and Physical  [name] NUU:[id number] DOB: [birth year] DOA: 01/06/2017  Referring physician: Cipriano Mile, PA Rehab PCP: Dorice [name], MD   Chief Complaint: Fever r/o sepsis  HPI: [name] is a 76 y.o. male with hx of HTN, CVA, ESRD on PD, DM on insulin, COPD and CHFw AICD, hx CAD/ CABG 2011.  Pt was admitted on 12/15/16 for non-healing L foot wound and R groin pseudoaneurysm, underwent repair of PA R groin and also resection of left 3rd metatarsal head by Dr Randie Heinz.  However, the left foot progressed and developed gangrenous changes, so on 9/14 pt went for L transmetatarsal amputation by Dr Lajoyce Corners.  Post op abx were stopped and pt admitted to rehab on 12/27/16.      On 9/23 pt had fevers and persistent ^WBC and tachycardia.  Started on [place] due to concerns of possible infection.  UcX and Blood cx's were negative.  CXR was negative.  Pt had increasing L foot pain w/ some skin changes/ blistering and serous drainage from L TMA wound.  Dr Lajoyce Corners was consulted and per the chart, possible plan is for surgical intervention next week.   Pt denies any current SOB, CP, abd pain, n/v/d.  He is from [place].  On PD.    ROS  denies CP  no joint pain   no HA  no blurry vision  no rash  no diarrhea  no nausea/ vomiting    Past Medical History  Past Medical History:  Diagnosis Date  . AICD (automatic cardioverter/defibrillator) present   . CHF (congestive heart failure) (HCC)   . COPD (chronic obstructive pulmonary disease) (HCC)   . Coronary artery disease    CABG 2011  . Diabetes mellitus without complication (HCC)   . ESRD on peritoneal dialysis [facility])    Started PD Dec 2016 in [place]. Nephrology is in [place].   [name] History of peritonitis    PD cath related peritonitis in April 2017  . Hypertension   . Peripheral vascular disease (HCC)   . Sjogren's syndrome (HCC)    Per pt's wife, was diagnosed in 2016 during w/u for cause of renal failure  prior to starting dialysis.  He had a rash on his chest apparently prompting this w/u.  Never had renal bx.  He was referred to a rheum MD in [place] and per the wife he was treated with MTX and other medications but had side effects to "all of it" and isn't taking anything for this as of Jun 2017.   . Stroke [facility])    Past Surgical History  Past Surgical History:  Procedure Laterality Date  . ABDOMINAL AORTOGRAM W/LOWER EXTREMITY N/A 11/29/2016   Procedure: ABDOMINAL AORTOGRAM W/LOWER EXTREMITY;  Surgeon: Nada Libman, MD;  Location: [facility] INVASIVE CV LAB;  Service: Cardiovascular;  Laterality: N/A;  . AMPUTATION Left 11/29/2016   Procedure: LEFT THIRD TOE AMPUTATION;  Surgeon: Nada Libman, MD;  Location: [facility] OR;  Service: Vascular;  Laterality: Left;  . AMPUTATION Left 12/23/2016   Procedure: Left Transmetatarsal Amputation;  Surgeon: Nadara Mustard, MD;  Location[facility] OR;  Service: Orthopedics;  Laterality: Left;  . ANGIOPLASTY  12/15/2016   Procedure: BALLOON ANGIOPLASTY;  Surgeon: Maeola Harman, MD;  Location: [facility] OR;  Service: Vascular;;  . cardiac catherization with stent placement  2017  . CORONARY ARTERY BYPASS GRAFT  2011  . FALSE ANEURYSM REPAIR Right 12/15/2016   Procedure: REPAIR FALSE ANEURYSM;  Surgeon: Lemar Livings  Cristal Deer, MD;  Location: [facility] OR;  Service: Vascular;  Laterality: Right;  . IRRIGATION AND DEBRIDEMENT FOOT Left 12/15/2016   Procedure: IRRIGATION AND DEBRIDEMENT FOOT;  Surgeon: Maeola Harman, MD;  Location: [facility] OR;  Service: Vascular;  Laterality: Left;  . LOWER EXTREMITY ANGIOGRAM Left 12/15/2016   Procedure: LOWER EXTREMITY ANGIOGRAM; PEDAL ACCESS;  Surgeon: Maeola Harman, MD;  Location[name] OR;  Service: Vascular;  Laterality: Left;  . PERIPHERAL VASCULAR BALLOON ANGIOPLASTY  11/29/2016   Procedure: PERIPHERAL VASCULAR BALLOON ANGIOPLASTY;  Surgeon: Nada Libman, MD;  Location: [facility] INVASIVE CV LAB;  Service: Cardiovascular;;  LT  Peroneal AT attempted unsuccessful   Family History  Family History  Problem Relation Age of Onset  . Heart disease Father    Social History  reports that he has quit smoking. He has never used smokeless tobacco. He reports that he does not drink alcohol or use drugs. Allergies  Allergies  Allergen Reactions  . Lisinopril Hives  . Methotrexate Derivatives Other (See Comments)    blisters  . Sulfa Antibiotics Hives  . Tape Hives  . Vancomycin Other (See Comments)    Blisters   . Zanaflex [Tizanidine Hcl]     Hypotensive stroke   Home medications Prior to Admission medications   Medication Sig Start Date End Date Taking? Authorizing Provider  aspirin EC 81 MG tablet Take 81 mg by mouth daily.    [provider]  atorvastatin (LIPITOR) 80 MG tablet Take 80 mg by mouth daily at 6 PM.    [provider]  calcium acetate (PHOSLO) 667 MG capsule Take 667 mg by mouth 3 (three) times daily with meals.    [provider]  carvedilol (COREG) 12.5 MG tablet Take 12.5 mg by mouth 2 (two) times daily with a meal.    [provider]  clopidogrel (PLAVIX) 75 MG tablet Take 75 mg by mouth daily.    [provider]  docusate sodium (COLACE) 100 MG capsule Take 100 mg by mouth 2 (two) times daily.     [provider]  furosemide (LASIX) 40 MG tablet Take 40 mg by mouth 2 (two) times daily.    [provider]  insulin NPH Human (HUMULIN N,NOVOLIN N) 100 UNIT/ML injection Inject 70 Units into the skin at bedtime.    [provider]  insulin regular (NOVOLIN R,HUMULIN R) 100 units/mL injection Inject 20 Units into the skin 3 (three) times daily before meals.    [provider]  isosorbide mononitrate (IMDUR) 30 MG 24 hr tablet Take 30 mg by mouth daily.    [provider]  nitroGLYCERIN (NITROSTAT) 0.4 MG SL tablet Place 0.4 mg under the tongue every 5 (five) minutes as needed for chest pain.    [provider]  ondansetron (ZOFRAN) 4 MG tablet Take 4 mg by mouth every 6 (six) hours as needed for nausea or vomiting.    [provider]  oxyCODONE-acetaminophen (PERCOCET/ROXICET) 5-325 MG tablet Take 1 tablet by mouth every 6 (six) hours as needed for severe pain. 11/30/16   Raymond Gurney, PA-C  Vitamin D, Ergocalciferol, (DRISDOL) 50000 units CAPS capsule Take 50,000 Units by mouth every 30 (thirty) days.     [provider]   Liver Function Tests  Recent Labs Lab 01/02/17 0756 01/03/17 1223 01/04/17 0558 01/06/17 0526  AST 25  --   --   --   ALT 18  --   --   --  ALKPHOS 68  --   --   --   BILITOT 0.4  --   --   --   PROT 5.8*  --   --   --   ALBUMIN 1.7* 1.6* 1.6* 1.6*   No results for input(s): LIPASE, AMYLASE in the last 168 hours. CBC  Recent Labs Lab 01/02/17 0756 01/04/17 0558 01/06/17 0526  WBC 11.5* 10.2 10.2  NEUTROABS 8.7*  --   --   HGB 9.5* 9.4* 9.9*  HCT 29.5* 29.0* 30.3*  MCV 96.1 95.4 95.6  PLT 428* 477* 389   Basic Metabolic Panel  Recent Labs Lab 12/31/16 0602 01/01/17 0538 01/02/17 0756 01/03/17 1223 01/04/17 0558 01/06/17 0526  NA 128* 127* 127* 130* 130* 133*  K 4.4 4.8 5.0 4.6 4.5 3.6  CL 91* 90* 90* 95* 91* 96*  CO2 GLUCOSE 315* 258* 383* 78 400* 111*  BUN 39* 45* 47* 49* 45* 53*  CREATININE 7.04* 7.00* 7.11* 6.76* 6.40* 6.27*  CALCIUM 8.1* 8.0* 8.3* 8.6* 8.4* 8.4*  PHOS  --   --   --  3.7 4.3 3.6     There were no vitals filed for this visit. Exam: Gen alert older adult male, lying flat, no distress No rash, cyanosis or gangrene Sclera anicteric, throat clear and moist  No jvd or bruits Chest diffusely poor air movement, no wheezing RRR no MRG Abd soft ntnd no mass or ascites +bs, obese GU normal male MS no joint effusions or deformity R 5th toe has small abrasion healing up L MTA amp site w/ bleeding in 2 spots through the dressing Ext no LE or UE edema / no wounds or  ulcers Neuro is alert, Ox 3 , nf    Home meds: -ecasa/ lipitor/ phoslo/ clopidogrel 75/ colace/ zofran prn/ NTG prn/ percocet prn/ vit D -coreg 12.5 bid/ lasix 40 bid/ Imdur 30 qd -NPH insulin 70 u qhs/ novolin 20 u tid ac  DC meds from rehab unit: - ecasa / atorvastatin/ phoslo 1 ac/celecoxib / topical diclofenac/ nepro/ topical gent/ protonix/ MVI/ senna bid/ vit D 50k every 30 d -NPH 40 am and 45 unit hs/ imdur 30 qd/  -midodrine 5 mg bid/ NTG dermal 0.2mg  qd / coreg 12.5 hs -plavix 75 qd/ SQ hep 5000 bid - darbe 100 ug weekly     Na 133  K 3.6  BUN 53  Cr 6.27   CA 8.4   WBC 10k  Hb 9.9      Assessment: 1.   L foot infection - sp recent L TMA, will likely need BKA.  Will admit to hospital from rehab unit per rehab providers request.  Dr Lajoyce Corners reportedly is planning on further surgery during the week next week.  Will continue IV fortaz, add Cubicin (vanc allergic).   2.   ESRD - cont PD per renal, vol looks good 3.   DM2 - cont insulin, diab diet 4.   Hx CVA - cont plavix 5.   Anemia of CKd -  Cont darbe 6.   Hypotension - cont midodrine 7.   CAD/ hx CABG / ICD - cont coreg/ imdur/ NTG patch  Plan - as above      [name] D [facility] Pager [phone]   If 7PM-7AM, please contact night-coverage [email or web address] Password [facility] 01/06/2017, 6:02 PM

## 2017-01-06 NOTE — Progress Notes (Signed)
Subjective:   Seen in room. Wife present. C/o worsening foot pain. Can't tolerate many pain meds. Some nausea but able to eat lunch today. Denies CP, SOB. PD overnight -tolerated well. Afebrile overnight. BP drop with position change. Going to be moved back to inpatient - having BP drop in the middle of the day- unprompted   Objective Vital signs in last 24 hours: Vitals:   01/05/17 0546 01/05/17 1443 01/06/17 0445 01/06/17 1130  BP: (!) 132/53 (!) 118/46 (!) 154/56 (!) 88/40  Pulse: (!) 105 65 (!) 101 (!) 102  Resp: Temp: 99.4 F (37.4 C) 98.3 F (36.8 C) 98.6 F (37 C) 98.3 F (36.8 C)  TempSrc: Oral Oral Oral Oral  SpO2: 95% 94% 100%   Weight:      Height:       Weight change:   Intake/Output Summary (Last 24 hours) at 01/06/17 1355 Last data filed at 01/06/17 0900  Gross per 24 hour  Intake            12735 ml  Output            12584 ml  Net              151 ml    Dialysis Orders: Center: [place] [facility]. 6 exchanges/day one mid-day exchange (occurs aroudn 9am). 2.5 liters of 2.5% per [facility] and 2 liters of 2.5% for last fill and mid day drain.  1. 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  S/p L transmet  12/23/16 with Dr. Lajoyce Corners. May be planning further surgical revision next week. Some wound breakdown noted today. On Fortaz 2. ESRD- [facility], using 2.5% dextrose; To facilitate CIR doing: [facility] 2.5L x 5 exchanges, , Dry Day.  At DC can resume prev PD Rx. Will just order labs for Friday- oon 9/26  dec fluids to 1/2 1.5% and 1/2 2.5 % with good results. Will continue 3. Hypertension/volume- BP stable- hyponatremia argues for volume overload but exam does not-  Was on all 1.5% fluid- used 2.5% fluid- coreg decreased to at night only - he seems to be a little orthostatic- have changed fluids to 1/2 1.5 and 1/2 2.5- start midodrine for orthostatic hypotension   4. Anemia (ABLA)- TSAT 45% and Ferritin 2284, cont  Aranesp 100 qThurs- trending up  5. Constipation: miralax as needed 6. Hypokalemia: was on supplementing, Now stopped. Follow trend.  7. Myoclonus / Confusion: stop gabapentin and robaxin, limit narcotics, follow 8. Bones- cont renavite [name] - P ok.  9. Hyperglycemia- may need more insulin 10. Low grade temp- blood cultures neg to date and fortaz-  fluid cell count fine   Tomasa Blase PA-C [facility] Pager [phone] 01/06/2017,2:14 PM   Patient seen and examined, agree with above note with above modifications. Patient continues to have some difficulty- not tolerating rehab very well- being transferred back to inpt.  Cont UF with 1/2 1.5 and 1/2 2.5% and start midodrine to help ?  Annie Sable, MD 01/06/2017       Labs: Basic Metabolic Panel:  Recent Labs Lab 01/03/17 1223 01/04/17 0558 01/06/17 0526  NA 130* 130* 133*  K 4.6 4.5 3.6  CL 95* 91* 96*  CO2 GLUCOSE 78 400* 111*  BUN 49* 45* 53*  CREATININE 6.76* 6.40* 6.27*  CALCIUM 8.6* 8.4* 8.4*  PHOS 3.7 4.3 3.6   Liver Function Tests:  Recent Labs Lab  01/02/17 0756 01/03/17 1223 01/04/17 0558 01/06/17 0526  AST 25  --   --   --   ALT 18  --   --   --   ALKPHOS 68  --   --   --   BILITOT 0.4  --   --   --   PROT 5.8*  --   --   --   ALBUMIN 1.7* 1.6* 1.6* 1.6*   No results for input(s): LIPASE, AMYLASE in the last 168 hours. No results for input(s): AMMONIA in the last 168 hours. CBC:  Recent Labs Lab 12/31/16 0602 01/02/17 0756 01/04/17 0558 01/06/17 0526  WBC 12.2* 11.5* 10.2 10.2  NEUTROABS  --  8.7*  --   --   HGB 9.2* 9.5* 9.4* 9.9*  HCT 28.5* 29.5* 29.0* 30.3*  MCV 95.0 96.1 95.4 95.6  PLT 418* 428* 477* 389   Cardiac Enzymes: No results for input(s): CKTOTAL, CKMB, CKMBINDEX, TROPONINI in the last 168 hours. CBG:  Recent Labs Lab 01/05/17 1131 01/05/17 1722 01/05/17 2110 01/06/17 0922 01/06/17 1123  GLUCAP 187* 201* 274* 92 138*     Iron Studies: No results for input(s): IRON, TIBC, TRANSFERRIN, FERRITIN in the last 72 hours. Studies/Results: No results found. Medications: Infusions: . cefTAZidime (FORTAZ)  IV Stopped (01/05/17 2144)  . dialysis solution 1.5% low-MG/low-CA    . dialysis solution 1.5% low-MG/low-CA    . dialysis solution 2.5% low-MG/low-CA    . dialysis solution 2.5% low-MG/low-CA      Scheduled Medications: . aspirin EC  81 mg Oral Daily  . atorvastatin  80 mg Oral q1800  . calcium acetate  667 mg Oral TID WC  . carvedilol  12.5 mg Oral QHS  . celecoxib  100 mg Oral Daily  . clopidogrel  75 mg Oral Daily  . darbepoetin (ARANESP) injection - DIALYSIS  100 mcg Subcutaneous Q Thu-1800  . diclofenac sodium  2 g Topical QID  . feeding supplement (NEPRO CARB STEADY)  237 mL Oral TID WC  . feeding supplement (PRO-STAT SUGAR FREE 64)  30 mL Oral BID  . gentamicin cream  1 application Topical Daily  . heparin  5,000 Units Subcutaneous Q8H  . insulin aspart  0-20 Units Subcutaneous TID WC  . insulin aspart  0-5 Units Subcutaneous QHS  . insulin NPH Human  40 Units Subcutaneous QAC breakfast  . insulin NPH Human  45 Units Subcutaneous QHS  . isosorbide mononitrate  30 mg Oral Daily  . mouth rinse  15 mL Mouth Rinse BID  . multivitamin  1 tablet Oral QHS  . MUSCLE RUB  1 application Topical QID  . neomycin-bacitracin-polymyxin   Topical Daily  . nitroGLYCERIN  0.2 mg Transdermal Daily  . pantoprazole  40 mg Oral Daily  . senna-docusate  2 tablet Oral BID  . [START ON 01/08/2017] Vitamin D (Ergocalciferol)  50,000 Units Oral Q30 days    have reviewed scheduled and prn medications.  Physical Exam: General: WNWD male, lying in bed  Heart: RRR Lungs: breathing unlabored CTAB  Abdomen: obese, soft, non tender Extremities: 1+ edema Dialysis Access: PD cath

## 2017-01-06 NOTE — Progress Notes (Signed)
Physical Therapy Session Note  Patient Details  Name: [name] MRN: [id number] Date of Birth: 1941-02-14  Today's Date: 01/06/2017 PT Individual Time: 1000-1052 PT Individual Time Calculation (min): 52 min   Short Term Goals: Week 2:  PT Short Term Goal 1 (Week 2): =LTGs due to ELOS  Skilled Therapeutic Interventions/Progress Updates:   Pt supine upon arrival and agreeable to therapy w/ encouragement. C/o L ankle and knee discomfort that is relieved by position changes. Worked on functional mobility this session. Transferred to EOB w/ Min guard and performed multiple stands w/ min A using RW while PT and wife assisted pt w/ donning LE garments and socks/shoes. Pt performed slide board transfer to w/c w/ Min guard. Practiced self-propelling w/c to gym for functional endurance and independence. When in gym, pt reporting he needed to have a bowel movement. Returned to room in w/c,  Total A, and transferred to toilet w/ Min A via stand pivot transfer w/ manual assistance at LLE to prevent WB. Pt performed multiple sit<>stands at toilet using rails in bathroom while PT performed pericare, Total A. Transferred back to w/c via slide board w/ supervision. Verbal and manual cues throughout session to prevent WB through LLE. Ended session in w/c and in care of wife, all needs met.   Therapy Documentation Precautions:  Precautions Precautions: Fall Precaution Comments: PD port Required Braces or Orthoses: Other Brace/Splint Other Brace/Splint: post op shoe Lt  Restrictions Weight Bearing Restrictions: Yes LLE Weight Bearing: Non weight bearing LLE Partial Weight Bearing Percentage or Pounds: Darco shoe  See Function Navigator for Current Functional Status.   Therapy/Group: Individual Therapy  [name] 01/06/2017, 10:53 AM

## 2017-01-06 NOTE — Progress Notes (Signed)
Patient has been transferred to the floor he is comfortable and without pain.

## 2017-01-06 NOTE — Progress Notes (Signed)
Patient cleared to use Celebrex by Dr. Kathrene Bongo.  Discussed with wife who reports that patient has been able to take motrin/aleve in the past without SE.  Will monitor for SE.

## 2017-01-07 ENCOUNTER — Inpatient Hospital Stay (HOSPITAL_COMMUNITY): Payer: Medicare Other | Admitting: Physical Therapy

## 2017-01-07 ENCOUNTER — Inpatient Hospital Stay (HOSPITAL_COMMUNITY): Payer: Medicare Other | Admitting: Occupational Therapy

## 2017-01-07 DIAGNOSIS — E876 Hypokalemia: Secondary | ICD-10-CM

## 2017-01-07 DIAGNOSIS — I25709 Atherosclerosis of coronary artery bypass graft(s), unspecified, with unspecified angina pectoris: Secondary | ICD-10-CM

## 2017-01-07 LAB — COMPREHENSIVE METABOLIC PANEL
ALT: 28 U/L (ref 17–63)
AST: 41 U/L (ref 15–41)
Albumin: 1.3 g/dL — ABNORMAL LOW (ref 3.5–5.0)
Alkaline Phosphatase: 60 U/L (ref 38–126)
Anion gap: 10 (ref 5–15)
BUN: 53 mg/dL — AB (ref 6–20)
CHLORIDE: 93 mmol/L — AB (ref 101–111)
CO2: 29 mmol/L (ref 22–32)
Calcium: 8.2 mg/dL — ABNORMAL LOW (ref 8.9–10.3)
Creatinine, Ser: 6.31 mg/dL — ABNORMAL HIGH (ref 0.61–1.24)
GFR calc Af Amer: 9 mL/min — ABNORMAL LOW (ref >60)
GFR, EST NON AFRICAN AMERICAN: 8 mL/min — AB (ref >60)
Glucose, Bld: 218 mg/dL — ABNORMAL HIGH (ref 65–99)
POTASSIUM: 3.2 mmol/L — AB (ref 3.5–5.1)
Sodium: 132 mmol/L — ABNORMAL LOW (ref 135–145)
Total Bilirubin: 0.4 mg/dL (ref 0.3–1.2)
Total Protein: 5.6 g/dL — ABNORMAL LOW (ref 6.5–8.1)

## 2017-01-07 LAB — MAGNESIUM: MAGNESIUM: 1.6 mg/dL — AB (ref 1.7–2.4)

## 2017-01-07 LAB — PHOSPHORUS: PHOSPHORUS: 3.8 mg/dL (ref 2.5–4.6)

## 2017-01-07 LAB — CK: CK TOTAL: 52 U/L (ref 49–397)

## 2017-01-07 MED ORDER — HEPARIN 1000 UNIT/ML FOR PERITONEAL DIALYSIS
INTRAPERITONEAL | Status: DC | PRN
  Filled 2017-01-07: qty 5000

## 2017-01-07 MED ORDER — DELFLEX-LC/1.5% DEXTROSE 344 MOSM/L IP SOLN
Freq: Every day | INTRAPERITONEAL | Status: DC
Start: 2017-01-07 — End: 2017-01-10
  Administered 2017-01-07: 5000 mL via INTRAPERITONEAL
  Administered 2017-01-08: 22:00:00 via INTRAPERITONEAL

## 2017-01-07 MED ORDER — DELFLEX-LC/1.5% DEXTROSE 344 MOSM/L IP SOLN: Freq: Every day | INTRAPERITONEAL | Status: DC

## 2017-01-07 MED ORDER — HEPARIN 1000 UNIT/ML FOR PERITONEAL DIALYSIS: 500.0000 [IU] | INTRAMUSCULAR | Status: DC | PRN

## 2017-01-07 MED ORDER — DELFLEX-LC/2.5% DEXTROSE 394 MOSM/L IP SOLN: Freq: Every day | INTRAPERITONEAL | Status: DC

## 2017-01-07 MED ORDER — DELFLEX-LC/2.5% DEXTROSE 394 MOSM/L IP SOLN
Freq: Every day | INTRAPERITONEAL | Status: DC
  Administered 2017-01-07: 5000 mL via INTRAPERITONEAL
  Administered 2017-01-08: 22:00:00 via INTRAPERITONEAL

## 2017-01-07 NOTE — Progress Notes (Signed)
PROGRESS NOTE    [name]  ZOX:[id number] DOB: 07/06/1940 DOA: 9/28/2018BENZION [name] [name], [name]   Brief Narrative:  [name] is a 76 y.o. male with hx of HTN, CVA, ESRD on PD, DM on insulin, COPD and CHFw AICD, hx CAD/ CABG 2011.  Pt was admitted on 12/15/16 for non-healing L foot wound and R groin pseudoaneurysm, underwent repair of PA R groin and also resection of left 3rd metatarsal head by Dr [name].  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  Post op abx were stopped and pt admitted to rehab on 12/27/16.      On 9/23 pt had fevers and persistent ^WBC and tachycardia.  Started on [place] due to concerns of possible infection.  UcX and Blood cx's were negative.  CXR was negative.  Pt had increasing L foot pain w/ some skin changes/ blistering and serous drainage from L TMA wound. Dr [name] was consulted and per the chart, possible plan is for surgical intervention next week. Discussed with Dr. Lajoyce [name] and will see the patient Monday. C/w Abx for now.   Assessment & Plan:   Principal Problem:   Left foot infection Active Problems:   ESRD on peritoneal dialysis (HCC)   CAD (coronary artery disease) of artery bypass graft   Chronic obstructive pulmonary disease (HCC)   Chronic congestive heart failure (HCC)   Chronic hypotension   ICD (implantable cardioverter-defibrillator) in place   IDDM (insulin dependent diabetes mellitus) (HCC)   Foot infection   Hypokalemia   Hypomagnesemia  L foot infection  -S/p recent L TMA, will likely need BKA.   -Admitted to the Hospital -WBC went from 10.2 -> 13.3 -Dr [name] reportedly is planning on further surgery during the week next week.   -Will continue IV fortaz, add Cubicin (vanc allergic).  -Dr. Lajoyce [name] to Assess on Monday per my conversation with him -WOC Nurse Consult   ESRD -C/w Calcium Acetate 667 mg po TIDwm -cont PD per  Nephro -Nephrology Dr. Kathrene Bongo Following  Diabetes Mellitus Type 2 -C/w Diabetic Diet -C/w Insulin NPH 40 units sq Daily qBreakfast and Insulin NPH 45 units sq qHS -C/w Sensitive Novolog SSI AC -CBG's ranging from 92-138  Hx of CVA  -Continue ASA 81 mg and Clopidogrel 75 mg po q Daily  -Atorvastatin 80 mg po qHS held because of interaction with Dapatomycin  PVD -Continue ASA 81 mg and Clopidogrel 75 mg po q Daily  -Atorvastatin 80 mg po qHS held because of interaction with Dapatomycin  Anemia of CKD  -Cont Darbepoetin Alfa 100 mcg qThursday  -C/w Vitamin D 50,000 units po q30 days -Hb/Hct went from 9.9/30.3 -> 9.4/28.8 -Continue to Monitor for S/Sx of Bleeding  -Repeat CBC in AM  Hypotension -Continue Midodrine 5 mg po BID  CAD/Hx CABG / ICD  -C/w NT 0.2 TD q24h, Isosorbide Mononitrate 30 mg po Daily, Carvedilol 12.5 mg po BID, ASA 81 mg, Clopidogrel 75 mg po Daily -Held Atorvastatin because of interaction with Daptomycin  Hypokalemia/Hypomagnesemia  -Patient's K+ was 3.2 and Mag was 1.6 -Replete with 1 gram of IV Mag Sulfate and 40 mEQ of KCl -Continue to Monitor and Replete as Necessary -Repeat CMP and Mag Level in AM  COPD -Currently not in Exacerbation -Continue to Monitor  DVT prophylaxis: Heparin 5,000 units sq q12h Code Status: FULL CODE Family Communication: Discussed with wife at bedside Disposition Plan: Remain Inpatient in  anticipation for surgical procedure  Consultants:   Nephrology  Orthopedic Surgery Dr. Lajoyce [name] with plans for surgical intervention in the coming weak    Procedures: Peritoneal Dialysis   Antimicrobials:  Anti-infectives    Start     Dose/Rate Route Frequency Ordered Stop   01/06/17 2200  cefTAZidime (FORTAZ) 500 mg in dextrose 5 % 50 mL IVPB     500 mg 100 mL/hr over 30 Minutes Intravenous Every 24 hours 01/06/17 1925     01/06/17 2030  DAPTOmycin (CUBICIN) 500 mg in sodium chloride 0.9 % IVPB     500 mg 220 mL/hr over 30  Minutes Intravenous Every 48 hours 01/06/17 1923       Subjective: Seen and examined and stated foot hurt a little bit. Had no other complaints. No Nausea or Vomiting or Chest Pain.   Objective: Vitals:   01/06/17 0800 01/07/17 0827 01/07/17 1505 01/07/17 1835  BP: (!) 123/96 (!) 138/50 (!) 121/43 (!) 117/57  Pulse: 89 98 87 79  Resp: Temp: (!) 97.4 F (36.3 C) 98.7 F (37.1 C) (!) 97.4 F (36.3 C) 97.7 F (36.5 C)  TempSrc: Oral Oral Oral Oral  SpO2: 100% 99% 98% 99%  Weight: 89.8 kg (197 lb 15.6 oz) 90.1 kg (198 lb 10.2 oz)  91.1 kg (200 lb 13.4 oz)    Intake/Output Summary (Last 24 hours) at 01/07/17 1949 Last data filed at 01/07/17 1859  Gross per 24 hour  Intake            12974 ml  Output            12657 ml  Net              317 ml   Filed Weights   01/06/17 0800 01/07/17 0827 01/07/17 1835  Weight: 89.8 kg (197 lb 15.6 oz) 90.1 kg (198 lb 10.2 oz) 91.1 kg (200 lb 13.4 oz)   Examination: Physical Exam:  Constitutional: WN/WD obese Caucasian Male in NAD and appears calm and comfortable Eyes: Lids and conjunctivae normal, sclerae anicteric  ENMT: External Ears, Nose appear normal. Grossly normal hearing. Mucous membranes are moist.   Neck: Appears normal, supple, no cervical masses, normal ROM, no appreciable thyromegaly, no JVD Respiratory: Clear to auscultation bilaterally, no wheezing, rales, rhonchi or crackles. Normal respiratory effort and patient is not tachypenic. No accessory muscle use.  Cardiovascular: RRR, no murmurs / rubs / gallops. S1 and S2 auscultated. Some edema in Left foot Abdomen: Soft, non-tender, non-distended. No masses palpated. No appreciable hepatosplenomegaly. Bowel sounds positive. Has PD Catheter in place GU: Deferred. Musculoskeletal: Left Metatarasal Amputation with necrosis and purulent drainage. No contractures. Skin: Has an ulcer on Right foot near fourth toe, and gangrenous changes on foot of left as mentioned. No  induration; Warm and dry.  Neurologic: CN 2-12 grossly intact with no focal deficits. Strength 5/5 in all 4. Romberg sign cerebellar reflexes not assessed.  Psychiatric: Normal judgment and insight. Alert and oriented x 3. Normal mood and appropriate affect.   Data Reviewed: I have personally reviewed following labs and imaging studies  CBC:  Recent Labs Lab 01/02/17 0756 01/04/17 0558 01/06/17 0526 01/06/17 1915  WBC 11.5* 10.2 10.2 13.3*  NEUTROABS 8.7*  --   --   --   HGB 9.5* 9.4* 9.9* 9.4*  HCT 29.5* 29.0* 30.3* 28.8*  MCV 96.1 95.4 95.6 94.7  PLT 428* 477* 389 425*   Basic Metabolic Panel:  Recent Labs Lab  01/02/17 0756 01/03/17 1223 01/04/17 0558 01/06/17 0526 01/06/17 1915 01/07/17 0948  NA 127* 130* 130* 133*  --  132*  K 5.0 4.6 4.5 3.6  --  3.2*  CL 90* 95* 91* 96*  --  93*  CO2 --  29  GLUCOSE 383* 78 400* 111*  --  218*  BUN 47* 49* 45* 53*  --  53*  CREATININE 7.11* 6.76* 6.40* 6.27* 6.87* 6.31*  CALCIUM 8.3* 8.6* 8.4* 8.4*  --  8.2*  MG  --   --   --   --   --  1.6*  PHOS  --  3.7 4.3 3.6  --  3.8   GFR: Estimated Creatinine Clearance: 11.3 mL/min (A) (by C-G formula based on SCr of 6.31 mg/dL (H)). Liver Function Tests:  Recent Labs Lab 01/02/17 0756 01/03/17 1223 01/04/17 0558 01/06/17 0526 01/07/17 0948  AST 25  --   --   --  41  ALT 18  --   --   --  28  ALKPHOS 68  --   --   --  60  BILITOT 0.4  --   --   --  0.4  PROT 5.8*  --   --   --  5.6*  ALBUMIN 1.7* 1.6* 1.6* 1.6* 1.3*   No results for input(s): LIPASE, AMYLASE in the last 168 hours. No results for input(s): AMMONIA in the last 168 hours. Coagulation Profile: No results for input(s): INR, PROTIME in the last 168 hours. Cardiac Enzymes:  Recent Labs Lab 01/07/17 0948  CKTOTAL 52   BNP (last 3 results) No results for input(s): PROBNP in the last 8760 hours. HbA1C: No results for input(s): HGBA1C in the last 72 hours. CBG:  Recent Labs Lab  01/05/17 1722 01/05/17 2110 01/06/17 0922 01/06/17 1123 01/06/17 1655  GLUCAP 201* 274* 92 138* 114*   Lipid Profile: No results for input(s): CHOL, HDL, LDLCALC, TRIG, CHOLHDL, LDLDIRECT in the last 72 hours. Thyroid Function Tests: No results for input(s): TSH, T4TOTAL, FREET4, T3FREE, THYROIDAB in the last 72 hours. Anemia Panel: No results for input(s): VITAMINB12, FOLATE, FERRITIN, TIBC, IRON, RETICCTPCT in the last 72 hours. Sepsis Labs: No results for input(s): PROCALCITON, LATICACIDVEN in the last 168 hours.  Recent Results (from the past 240 hour(s))  Culture, Urine     Status: None   Collection Time: 01/02/17 12:14 PM  Result Value Ref Range Status   Specimen Description URINE, CATHETERIZED  Final   Special Requests NONE  Final   Culture NO GROWTH  Final   Report Status 01/03/2017 FINAL  Final  Culture, blood (routine x 2)     Status: None (Preliminary result)   Collection Time: 01/03/17 10:41 PM  Result Value Ref Range Status   Specimen Description BLOOD RIGHT HAND  Final   Special Requests   Final    BOTTLES DRAWN AEROBIC AND ANAEROBIC Blood Culture adequate volume   Culture NO GROWTH 3 DAYS  Final   Report Status PENDING  Incomplete  Culture, blood (routine x 2)     Status: None (Preliminary result)   Collection Time: 01/03/17 10:41 PM  Result Value Ref Range Status   Specimen Description BLOOD RIGHT ANTECUBITAL  Final   Special Requests   Final    BOTTLES DRAWN AEROBIC AND ANAEROBIC Blood Culture adequate volume   Culture NO GROWTH 3 DAYS  Final   Report Status PENDING  Incomplete    Radiology Studies: No results  found.   Scheduled Meds: . aspirin EC  81 mg Oral Daily  . calcium acetate  667 mg Oral TID WC  . carvedilol  12.5 mg Oral QHS  . celecoxib  100 mg Oral Daily  . clopidogrel  75 mg Oral Daily  . [START ON 01/12/2017] darbepoetin (ARANESP) injection - DIALYSIS  100 mcg Subcutaneous Q Thu-HD  . diclofenac sodium  2 g Topical QID  . docusate  sodium  100 mg Oral BID  . feeding supplement (NEPRO CARB STEADY)  237 mL Oral BID BM  . gentamicin cream   Topical TID  . heparin  5,000 Units Subcutaneous Q12H  . insulin aspart  0-9 Units Subcutaneous TID WC  . insulin NPH Human  40 Units Subcutaneous QAC breakfast  . insulin NPH Human  45 Units Subcutaneous QHS  . isosorbide mononitrate  30 mg Oral Daily  . midodrine  5 mg Oral BID WC  . multivitamin  1 tablet Oral QHS  . nitroGLYCERIN  0.2 mg Transdermal Daily  . senna  1 tablet Oral BID  . sodium chloride flush  3 mL Intravenous Q12H  . Vitamin D (Ergocalciferol)  50,000 Units Oral Q30 days   Continuous Infusions: . sodium chloride    . cefTAZidime (FORTAZ)  IV Stopped (01/06/17 2322)  . DAPTOmycin (CUBICIN)  IV Stopped (01/06/17 2321)  . dialysis solution 1.5% low-MG/low-CA    . dialysis solution 2.5% low-MG/low-CA      LOS: 1 day   Merlene Laughter, DO [facility] Pager [phone]  If 7PM-7AM, please contact night-coverage [email or web address] Password [facility] 01/07/2017, 7:49 PM

## 2017-01-07 NOTE — Progress Notes (Signed)
Subjective:   Moved to 5W from rehab, cont pain and suspected infection - possible surgical intervention on transmet/BKA in plans.  No issues with PD- he looks better today  Objective Vital signs in last 24 hours: Vitals:   01/06/17 0800 01/07/17 0827  BP: (!) 123/96 (!) 138/50  Pulse: 89 98  Resp: 18   Temp: (!) 97.4 F (36.3 C) 98.7 F (37.1 C)  TempSrc: Oral Oral  SpO2: 100%   Weight: 89.8 kg (197 lb 15.6 oz) 90.1 kg (198 lb 10.2 oz)   Weight change:   Intake/Output Summary (Last 24 hours) at 01/07/17 1140 Last data filed at 01/07/17 6045  Gross per 24 hour  Intake              320 ml  Output                0 ml  Net              320 ml    Dialysis Orders: Center: [place] [facility]. 6 exchanges/day one mid-day exchange (occurs aroudn 9am). 2.5 liters of 2.5% per [facility] and 2 liters of 2.5% for last fill and mid day drain.  1. 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  S/p L transmet  12/23/16 with Dr. Lajoyce Corners. May be planning further surgical revision next week. Some wound breakdown noted today. On Fortaz- probably will need BKA 2. ESRD- [facility], using 2.5% dextrose; To facilitate CIR doing: [facility] 2.5L x 5 exchanges, , Dry Day.  At DC can resume prev PD Rx. Will just order labs for Friday- oon 9/26  dec fluids to 1/2 1.5% and 1/2 2.5 % with good results. Will continue 3. Hypertension/volume- BP stable- hyponatremia argues for volume overload but exam does not-  Was on all 1.5% fluid- used 2.5% fluid- coreg decreased to at night only - he seems to be a little orthostatic- have changed fluids to 1/2 1.5 and 1/2 2.5- started midodrine for orthostatic hypotension   4. Anemia (ABLA)- TSAT 45% and Ferritin 2284, cont Aranesp 100 qThurs- trending up  5. Constipation: miralax as needed 6. Hypokalemia: was on supplementing, Now stopped. Follow trend.  7. Myoclonus / Confusion: stop gabapentin and robaxin, limit narcotics,  follow 8. Bones- cont renavite [name] - P ok. He does not want renal diet, phos and K OK- will Liberalize diet 9. Hyperglycemia- may need more insulin 10. Low grade temp- blood cultures neg to date and fortaz/dapto-  fluid cell count fine - foot thought to be source  Annie Sable, MD 01/07/2017       Labs: Basic Metabolic Panel:  Recent Labs Lab 01/03/17 1223 01/04/17 0558 01/06/17 0526 01/06/17 1915  NA 130* 130* 133*  --   K 4.6 4.5 3.6  --   CL 95* 91* 96*  --   CO2 --   GLUCOSE 78 400* 111*  --   BUN 49* 45* 53*  --   CREATININE 6.76* 6.40* 6.27* 6.87*  CALCIUM 8.6* 8.4* 8.4*  --   PHOS 3.7 4.3 3.6  --    Liver Function Tests:  Recent Labs Lab 01/02/17 0756 01/03/17 1223 01/04/17 0558 01/06/17 0526  AST 25  --   --   --   ALT 18  --   --   --   ALKPHOS 68  --   --   --   BILITOT 0.4  --   --   --  PROT 5.8*  --   --   --   ALBUMIN 1.7* 1.6* 1.6* 1.6*   No results for input(s): LIPASE, AMYLASE in the last 168 hours. No results for input(s): AMMONIA in the last 168 hours. CBC:  Recent Labs Lab 01/02/17 0756 01/04/17 0558 01/06/17 0526 01/06/17 1915  WBC 11.5* 10.2 10.2 13.3*  NEUTROABS 8.7*  --   --   --   HGB 9.5* 9.4* 9.9* 9.4*  HCT 29.5* 29.0* 30.3* 28.8*  MCV 96.1 95.4 95.6 94.7  PLT 428* 477* 389 425*   Cardiac Enzymes: No results for input(s): CKTOTAL, CKMB, CKMBINDEX, TROPONINI in the last 168 hours. CBG:  Recent Labs Lab 01/05/17 1722 01/05/17 2110 01/06/17 0922 01/06/17 1123 01/06/17 1655  GLUCAP 201* 274* 92 138* 114*    Iron Studies: No results for input(s): IRON, TIBC, TRANSFERRIN, FERRITIN in the last 72 hours. Studies/Results: No results found. Medications: Infusions: . sodium chloride    . cefTAZidime (FORTAZ)  IV Stopped (01/06/17 2322)  . DAPTOmycin (CUBICIN)  IV Stopped (01/06/17 2321)    Scheduled Medications: . aspirin EC  81 mg Oral Daily  . calcium acetate  667 mg Oral TID WC  .  carvedilol  12.5 mg Oral QHS  . celecoxib  100 mg Oral Daily  . clopidogrel  75 mg Oral Daily  . [START ON 01/12/2017] darbepoetin (ARANESP) injection - DIALYSIS  100 mcg Subcutaneous Q Thu-HD  . diclofenac sodium  2 g Topical QID  . docusate sodium  100 mg Oral BID  . feeding supplement (NEPRO CARB STEADY)  237 mL Oral BID BM  . gentamicin cream   Topical TID  . heparin  5,000 Units Subcutaneous Q12H  . insulin aspart  0-9 Units Subcutaneous TID WC  . insulin NPH Human  40 Units Subcutaneous QAC breakfast  . insulin NPH Human  45 Units Subcutaneous QHS  . isosorbide mononitrate  30 mg Oral Daily  . midodrine  5 mg Oral BID WC  . multivitamin  1 tablet Oral QHS  . nitroGLYCERIN  0.2 mg Transdermal Daily  . senna  1 tablet Oral BID  . sodium chloride flush  3 mL Intravenous Q12H  . Vitamin D (Ergocalciferol)  50,000 Units Oral Q30 days    have reviewed scheduled and prn medications.  Physical Exam: General: WNWD male, lying in bed  Heart: RRR Lungs: breathing unlabored CTAB  Abdomen: obese, soft, non tender Extremities: 1+ edema Dialysis Access: PD cath

## 2017-01-08 DIAGNOSIS — I5022 Chronic systolic (congestive) heart failure: Secondary | ICD-10-CM

## 2017-01-08 DIAGNOSIS — R000 Tachycardia, unspecified: Secondary | ICD-10-CM

## 2017-01-08 DIAGNOSIS — R079 Chest pain, unspecified: Secondary | ICD-10-CM

## 2017-01-08 DIAGNOSIS — L089 Local infection of the skin and subcutaneous tissue, unspecified: Secondary | ICD-10-CM

## 2017-01-08 DIAGNOSIS — I2510 Atherosclerotic heart disease of native coronary artery without angina pectoris: Secondary | ICD-10-CM

## 2017-01-08 DIAGNOSIS — I214 Non-ST elevation (NSTEMI) myocardial infarction: Secondary | ICD-10-CM

## 2017-01-08 LAB — MRSA PCR SCREENING: MRSA by PCR: NEGATIVE

## 2017-01-08 LAB — CBC WITH DIFFERENTIAL/PLATELET
BASOS ABS: 0 10*3/uL (ref 0.0–0.1)
Basophils Relative: 0 %
Eosinophils Absolute: 0.2 10*3/uL (ref 0.0–0.7)
Eosinophils Relative: 2 %
HEMATOCRIT: 26.5 % — AB (ref 39.0–52.0)
HEMOGLOBIN: 8.6 g/dL — AB (ref 13.0–17.0)
LYMPHS PCT: 10 %
Lymphs Abs: 0.8 10*3/uL (ref 0.7–4.0)
MCH: 30.7 pg (ref 26.0–34.0)
MCHC: 32.5 g/dL (ref 30.0–36.0)
MCV: 94.6 fL (ref 78.0–100.0)
MONO ABS: 0.9 10*3/uL (ref 0.1–1.0)
Monocytes Relative: 11 %
NEUTROS ABS: 6.1 10*3/uL (ref 1.7–7.7)
NEUTROS PCT: 77 %
Platelets: 410 10*3/uL — ABNORMAL HIGH (ref 150–400)
RBC: 2.8 MIL/uL — AB (ref 4.22–5.81)
RDW: 14.6 % (ref 11.5–15.5)
WBC: 8 10*3/uL (ref 4.0–10.5)

## 2017-01-08 LAB — COMPREHENSIVE METABOLIC PANEL
ALBUMIN: 1.3 g/dL — AB (ref 3.5–5.0)
ALK PHOS: 63 U/L (ref 38–126)
ALT: 33 U/L (ref 17–63)
AST: 79 U/L — AB (ref 15–41)
Anion gap: 11 (ref 5–15)
BILIRUBIN TOTAL: 0.4 mg/dL (ref 0.3–1.2)
BUN: 53 mg/dL — AB (ref 6–20)
CO2: 28 mmol/L (ref 22–32)
CREATININE: 6.47 mg/dL — AB (ref 0.61–1.24)
Calcium: 8.1 mg/dL — ABNORMAL LOW (ref 8.9–10.3)
Chloride: 92 mmol/L — ABNORMAL LOW (ref 101–111)
GFR calc Af Amer: 9 mL/min — ABNORMAL LOW (ref >60)
GFR, EST NON AFRICAN AMERICAN: 7 mL/min — AB (ref >60)
GLUCOSE: 344 mg/dL — AB (ref 65–99)
Potassium: 3.3 mmol/L — ABNORMAL LOW (ref 3.5–5.1)
Sodium: 131 mmol/L — ABNORMAL LOW (ref 135–145)
TOTAL PROTEIN: 5.9 g/dL — AB (ref 6.5–8.1)

## 2017-01-08 LAB — HEPARIN LEVEL (UNFRACTIONATED): Heparin Unfractionated: 0.28 IU/mL — ABNORMAL LOW (ref 0.30–0.70)

## 2017-01-08 LAB — TROPONIN I
TROPONIN I: 1.73 ng/mL — AB (ref <0.03)
Troponin I: 0.27 ng/mL (ref <0.03)
Troponin I: 0.51 ng/mL (ref <0.03)

## 2017-01-08 LAB — GLUCOSE, CAPILLARY
Glucose-Capillary: 142 mg/dL — ABNORMAL HIGH (ref 65–99)
Glucose-Capillary: 149 mg/dL — ABNORMAL HIGH (ref 65–99)

## 2017-01-08 LAB — PHOSPHORUS: Phosphorus: 3.6 mg/dL (ref 2.5–4.6)

## 2017-01-08 LAB — MAGNESIUM: MAGNESIUM: 1.5 mg/dL — AB (ref 1.7–2.4)

## 2017-01-08 MED ORDER — NITROGLYCERIN IN D5W 200-5 MCG/ML-% IV SOLN
0.0000 ug/min | INTRAVENOUS | Status: DC
  Filled 2017-01-08: qty 250

## 2017-01-08 MED ORDER — MAGNESIUM SULFATE IN D5W 1-5 GM/100ML-% IV SOLN
1.0000 g | Freq: Once | INTRAVENOUS | Status: AC
  Administered 2017-01-08: 1 g via INTRAVENOUS
  Filled 2017-01-08: qty 100

## 2017-01-08 MED ORDER — DELFLEX-LC/2.5% DEXTROSE 394 MOSM/L IP SOLN: Freq: Every day | INTRAPERITONEAL | Status: DC

## 2017-01-08 MED ORDER — HEPARIN (PORCINE) IN NACL 100-0.45 UNIT/ML-% IJ SOLN
1300.0000 [IU]/h | INTRAMUSCULAR | Status: DC
Start: 2017-01-08 — End: 2017-01-10
  Administered 2017-01-08: 1200 [IU]/h via INTRAVENOUS
  Administered 2017-01-09: 1300 [IU]/h via INTRAVENOUS
  Filled 2017-01-08 (×2): qty 250

## 2017-01-08 MED ORDER — GENTAMICIN SULFATE 0.1 % EX CREA
1.0000 "application " | TOPICAL_CREAM | Freq: Every day | CUTANEOUS | Status: DC
  Administered 2017-01-09 – 2017-01-17 (×9): 1 via TOPICAL
  Filled 2017-01-08: qty 15

## 2017-01-08 MED ORDER — HEPARIN 1000 UNIT/ML FOR PERITONEAL DIALYSIS
INTRAPERITONEAL | Status: DC | PRN
  Filled 2017-01-08: qty 3000

## 2017-01-08 MED ORDER — ASPIRIN 325 MG PO TABS
ORAL_TABLET | ORAL | Status: AC
  Filled 2017-01-08: qty 1

## 2017-01-08 MED ORDER — ASPIRIN EC 81 MG PO TBEC: 81.0000 mg | DELAYED_RELEASE_TABLET | Freq: Every day | ORAL | Status: DC

## 2017-01-08 MED ORDER — POTASSIUM CHLORIDE CRYS ER 20 MEQ PO TBCR
40.0000 meq | EXTENDED_RELEASE_TABLET | Freq: Once | ORAL | Status: AC
  Administered 2017-01-08: 40 meq via ORAL
  Filled 2017-01-08: qty 2

## 2017-01-08 MED ORDER — ASPIRIN EC 325 MG PO TBEC
325.0000 mg | DELAYED_RELEASE_TABLET | Freq: Once | ORAL | Status: AC
  Administered 2017-01-08: 325 mg via ORAL

## 2017-01-08 MED ORDER — HEPARIN 1000 UNIT/ML FOR PERITONEAL DIALYSIS: 500.0000 [IU] | INTRAMUSCULAR | Status: DC | PRN

## 2017-01-08 NOTE — Progress Notes (Addendum)
Entered pt room this morning to give AM meds and he complained of bilateral jaw pain. Assessed pt with no SOB or distressed noted. Meds given, dressing changed and then an hour later pt wife called and stated pt was experiencing chest pain. Assessed pt again, he stated he had pain in the mid upper chest, vitals taken, Dr. [name] Mcalpine was notified. Pt was given sublingual nitro and stated he felt better. Dr. [name] Mcalpine ordered STAT EKG, another nitro and tronponin levels q6 x3. Cardiac consult ordered and came to assess pt ordering another sublingual nitro and recommending nitro drip. Order placed by Cardiac to transfer pt to step-down unit. Dr. [name] Mcalpine ordered Heparin drip which was started before pt was transferred to step down. IV access infiltrated right before transfer. IV team was able to start new sites in RUA and RH. Heparin drip re-started and patient was transferred to [facility] bed 2 without complications. Hemodialysis notified that patient had been transferred and machine was taken to pt room.

## 2017-01-08 NOTE — Progress Notes (Signed)
PROGRESS NOTE    [name]  OZH:[id number] DOB: [birth year] DOA: 01/06/2017 PCP: Dorice [name], MD   Brief Narrative:  [name] is a 76 y.o. male with hx of HTN, CVA, ESRD on PD, DM on insulin, COPD and CHFw AICD, hx CAD/ CABG 2011.  Pt was admitted on 12/15/16 for non-healing L foot wound and R groin pseudoaneurysm, underwent repair of PA R groin and also resection of left 3rd metatarsal head by Dr Randie Heinz.  However, the left foot progressed and developed gangrenous changes, so on 9/14 pt went for L transmetatarsal amputation by Dr Lajoyce Corners.  Post op abx were stopped and pt admitted to rehab on 12/27/16.      On 9/23 pt had fevers and persistent ^WBC and tachycardia.  Started on [place] due to concerns of possible infection.  UcX and Blood cx's were negative.  CXR was negative.  Pt had increasing L foot pain w/ some skin changes/ blistering and serous drainage from L TMA wound. Dr Lajoyce Corners was consulted and per the chart, possible plan is for surgical intervention next week. Discussed with Dr. Lajoyce Corners and will see the patient Monday. C/w Abx for now.   Patient developed Chest Pain this AM so he was given full dose ASA, SL NTG, and a Stat EKG and Troponin was ordered. I reviewed the EKG and showed significant ST Depressions (agreed by Cardiologist Dr. Donnie Aho) so Cardiology was consulted and patient was placed on a NTG gtt and Heparin gtt and transferred to SDU.   Assessment & Plan:   Principal Problem:   Left foot infection Active Problems:   ESRD on peritoneal dialysis (HCC)   CAD (coronary artery disease) of artery bypass graft   Chronic obstructive pulmonary disease (HCC)   Chronic congestive heart failure (HCC)   Chronic hypotension   ICD (implantable cardioverter-defibrillator) in place   IDDM (insulin dependent diabetes mellitus) (HCC)   Foot infection   Hypokalemia   Hypomagnesemia  Acute Anginal Chest Pain with Concern for an NSTEMI -Ordered Full dose ASA 325 mg po  -C/w  Plavix 75 mg po Daily  -Given NTG and ordered Stat Troponin and EKG -Troponin Elevated at 0.27 -Cardiology Consulted and evaluated and patient being transferred to SDU and being placed on Heparin gtt and Nitroglycerin gtt -Defer to Cardiology for further evaluation and Management -Continue to Cycle Cardiac Troponin   L foot infection  -S/p recent L TMA, will likely need BKA.   -Admitted to the Hospital -WBC went from 10.2 -> 13.3 -> 8.0 -Dr Lajoyce Corners to evaluate for further surgery next week   -Will continue IV fortaz, add Cubicin (vanc allergic).  -Dr. Lajoyce Corners to Assess on Monday per my conversation with him -WOC Nurse Consult  ESRD -C/w Calcium Acetate 667 mg po TIDwm -Cont PD per Nephro -Nephrology Dr. Kathrene Bongo Following  Diabetes Mellitus Type 2 -C/w Diabetic Diet -C/w Insulin NPH 40 units sq Daily qBreakfast and Insulin NPH 45 units sq qHS -C/w Sensitive Novolog SSI AC -CBG's have not been done for the last day; CMP BS shower 344  Hx of CVA  -Continue ASA 81 mg and Clopidogrel 75 mg po q Daily  -Atorvastatin 80 mg po qHS held because of interaction with Dapatomycin  PVD -Continue ASA 81 mg and Clopidogrel 75 mg po q Daily  -Atorvastatin 80 mg po qHS held because of interaction with Dapatomycin -Patient is s/p Left Peroneal Artery Revascularization and recent Left Transmetatarsal Amputation on 9/14  Anemia of CKD  -Cont  Darbepoetin Alfa 100 mcg qThursday  -C/w Vitamin D 50,000 units po q30 days -Hb/Hct went from 9.9/30.3 -> 9.4/28.8 -> 8.6/26.5 -Continue to Monitor for S/Sx of Bleeding  -Repeat CBC in AM  Hypotension -Continue Midodrine 5 mg po BID  CAD/Hx CABG / ICD  -C/w NTG gtt;  Isosorbide Mononitrate 30 mg po Daily stopped by Cardiology  -C/w Carvedilol 12.5 mg po BID, ASA 81 mg, Clopidogrel 75 mg po Daily -Held Atorvastatin because of interaction with Daptomycin  Hypokalemia/Hypomagnesemia  -Patient's K+ was 3.3 and Mag was 1.5 -Replete with 1 gram of IV Mag  Sulfate and 40 mEQ of KCl again -Continue to Monitor and Replete as Necessary -Repeat CMP and Mag Level in AM  COPD -Currently not in Exacerbation -Continue to Monitor  Chronic Systolic CHF -Currently not exacerbation -Had recent ICD placement and Recent ECHO in July which showed EF of 35% -Strict I's and O's; Daily Weights -Cardiology now following and appreciate their Recc's   DVT prophylaxis: Anticoagulated with Heparin gtt Code Status: FULL CODE Family Communication: Discussed with wife at bedside Disposition Plan: Remain Inpatient in anticipation for surgical procedure  Consultants:   Nephrology  Orthopedic Surgery Dr. Lajoyce Corners with plans for surgical intervention in the coming weak   Cardiology   Procedures: Peritoneal Dialysis   Antimicrobials:  Anti-infectives    Start     Dose/Rate Route Frequency Ordered Stop   01/06/17 2200  cefTAZidime (FORTAZ) 500 mg in dextrose 5 % 50 mL IVPB     500 mg 100 mL/hr over 30 Minutes Intravenous Every 24 hours 01/06/17 1925     01/06/17 2030  DAPTOmycin (CUBICIN) 500 mg in sodium chloride 0.9 % IVPB     500 mg 220 mL/hr over 30 Minutes Intravenous Every 48 hours 01/06/17 1923       Subjective: Seen and examined and stated foot hurt a little bit. Had no other complaints. No Nausea or Vomiting or Chest Pain.   Objective: Vitals:   01/08/17 0504 01/08/17 1022 01/08/17 1143 01/08/17 1147  BP: (!) 154/58 (!) 152/52  (!) 136/58  Pulse: 89 (!) 104  (!) 102  Resp: Temp: 98.6 F (37 C) 100.1 F (37.8 C) 98.1 F (36.7 C) 98.1 F (36.7 C)  TempSrc: Oral Oral Oral   SpO2: 100% 96%  97%  Weight:        Intake/Output Summary (Last 24 hours) at 01/08/17 1355 Last data filed at 01/08/17 0830  Gross per 24 hour  Intake            12668 ml  Output            12155 ml  Net              513 ml   Filed Weights   01/06/17 0800 01/07/17 0827 01/07/17 1835  Weight: 89.8 kg (197 lb 15.6 oz) 90.1 kg (198 lb 10.2 oz) 91.1 kg  (200 lb 13.4 oz)   Examination: Physical Exam:  Constitutional: WN/WD Caucasian male slightly diaphoretic and appears in some discomfort Eyes: Sclerae anicteric. Lids normal ENMT: External Ears and nose appear normal. MMM Neck: Supple with no JVD Respiratory: CTAB; No appreciable wheezing/rales/rhonchi Cardiovascular: Slightly tachycardic; no m/r/g. S1 S2; Left foot wrapped Abdomen: Soft, NT, ND. Bowel Sounds present; Has PD catheter in place GU: Deferred Musculoskeletal: Has a Left Metatarsal Amputation. Skin: Left foot gangrenous/ulcer wrapped today. Skin was warm and dry Neurologic: CN 2-12 grossly intact. No appreciable focal deficits Psychiatric:  Anxious Caucasian male. Awake and alert. Oriented x3. Normal mood and affect  Data Reviewed: I have personally reviewed following labs and imaging studies  CBC:  Recent Labs Lab 01/02/17 0756 01/04/17 0558 01/06/17 0526 01/06/17 1915 01/08/17 0359  WBC 11.5* 10.2 10.2 13.3* 8.0  NEUTROABS 8.7*  --   --   --  6.1  HGB 9.5* 9.4* 9.9* 9.4* 8.6*  HCT 29.5* 29.0* 30.3* 28.8* 26.5*  MCV 96.1 95.4 95.6 94.7 94.6  PLT 428* 477* 389 425* 410*   Basic Metabolic Panel:  Recent Labs Lab 01/03/17 1223 01/04/17 0558 01/06/17 0526 01/06/17 1915 01/07/17 0948 01/08/17 0359  NA 130* 130* 133*  --  132* 131*  K 4.6 4.5 3.6  --  3.2* 3.3*  CL 95* 91* 96*  --  93* 92*  CO2 --  29 28  GLUCOSE 78 400* 111*  --  218* 344*  BUN 49* 45* 53*  --  53* 53*  CREATININE 6.76* 6.40* 6.27* 6.87* 6.31* 6.47*  CALCIUM 8.6* 8.4* 8.4*  --  8.2* 8.1*  MG  --   --   --   --  1.6* 1.5*  PHOS 3.7 4.3 3.6  --  3.8 3.6   GFR: Estimated Creatinine Clearance: 11 mL/min (A) (by C-G formula based on SCr of 6.47 mg/dL (H)). Liver Function Tests:  Recent Labs Lab 01/02/17 0756 01/03/17 1223 01/04/17 0558 01/06/17 0526 01/07/17 0948 01/08/17 0359  AST 25  --   --   --  41 79*  ALT 18  --   --   --  28 33  ALKPHOS 68  --   --   --  60 63    BILITOT 0.4  --   --   --  0.4 0.4  PROT 5.8*  --   --   --  5.6* 5.9*  ALBUMIN 1.7* 1.6* 1.6* 1.6* 1.3* 1.3*   No results for input(s): LIPASE, AMYLASE in the last 168 hours. No results for input(s): AMMONIA in the last 168 hours. Coagulation Profile: No results for input(s): INR, PROTIME in the last 168 hours. Cardiac Enzymes:  Recent Labs Lab 01/07/17 0948 01/08/17 1056  CKTOTAL 52  --   TROPONINI  --  0.27*   BNP (last 3 results) No results for input(s): PROBNP in the last 8760 hours. HbA1C: No results for input(s): HGBA1C in the last 72 hours. CBG:  Recent Labs Lab 01/05/17 1722 01/05/17 2110 01/06/17 0922 01/06/17 1123 01/06/17 1655  GLUCAP 201* 274* 92 138* 114*   Lipid Profile: No results for input(s): CHOL, HDL, LDLCALC, TRIG, CHOLHDL, LDLDIRECT in the last 72 hours. Thyroid Function Tests: No results for input(s): TSH, T4TOTAL, FREET4, T3FREE, THYROIDAB in the last 72 hours. Anemia Panel: No results for input(s): VITAMINB12, FOLATE, FERRITIN, TIBC, IRON, RETICCTPCT in the last 72 hours. Sepsis Labs: No results for input(s): PROCALCITON, LATICACIDVEN in the last 168 hours.  Recent Results (from the past 240 hour(s))  Culture, Urine     Status: None   Collection Time: 01/02/17 12:14 PM  Result Value Ref Range Status   Specimen Description URINE, CATHETERIZED  Final   Special Requests NONE  Final   Culture NO GROWTH  Final   Report Status 01/03/2017 FINAL  Final  Culture, blood (routine x 2)     Status: None (Preliminary result)   Collection Time: 01/03/17 10:41 PM  Result Value Ref Range Status   Specimen Description BLOOD RIGHT HAND  Final  Special Requests   Final    BOTTLES DRAWN AEROBIC AND ANAEROBIC Blood Culture adequate volume   Culture NO GROWTH 3 DAYS  Final   Report Status PENDING  Incomplete  Culture, blood (routine x 2)     Status: None (Preliminary result)   Collection Time: 01/03/17 10:41 PM  Result Value Ref Range Status   Specimen  Description BLOOD RIGHT ANTECUBITAL  Final   Special Requests   Final    BOTTLES DRAWN AEROBIC AND ANAEROBIC Blood Culture adequate volume   Culture NO GROWTH 3 DAYS  Final   Report Status PENDING  Incomplete    Radiology Studies: No results found.   Scheduled Meds: . calcium acetate  667 mg Oral TID WC  . carvedilol  12.5 mg Oral QHS  . celecoxib  100 mg Oral Daily  . clopidogrel  75 mg Oral Daily  . [START ON 01/12/2017] darbepoetin (ARANESP) injection - DIALYSIS  100 mcg Subcutaneous Q Thu-HD  . diclofenac sodium  2 g Topical QID  . docusate sodium  100 mg Oral BID  . feeding supplement (NEPRO CARB STEADY)  237 mL Oral BID BM  . gentamicin cream  1 application Topical Daily  . gentamicin cream   Topical TID  . insulin aspart  0-9 Units Subcutaneous TID WC  . insulin NPH Human  40 Units Subcutaneous QAC breakfast  . insulin NPH Human  45 Units Subcutaneous QHS  . midodrine  5 mg Oral BID WC  . multivitamin  1 tablet Oral QHS  . senna  1 tablet Oral BID  . sodium chloride flush  3 mL Intravenous Q12H  . Vitamin D (Ergocalciferol)  50,000 Units Oral Q30 days   Continuous Infusions: . sodium chloride    . cefTAZidime (FORTAZ)  IV Stopped (01/08/17 0455)  . DAPTOmycin (CUBICIN)  IV Stopped (01/06/17 2321)  . dialysis solution 1.5% low-MG/low-CA    . dialysis solution 2.5% low-MG/low-CA    . heparin    . nitroGLYCERIN      LOS: 2 days   Merlene Laughter, DO [facility] Pager [phone]  If 7PM-7AM, please contact night-coverage [email or web address] Password [email or web address] 01/08/2017, 1:55 PM

## 2017-01-08 NOTE — Consult Note (Signed)
Cardiology Consult    Patient ID: [name]; [id number]; Mar 17, 1941   Admit date: 01/06/2017 Date of Consult: 01/08/2017  Primary Care Provider: Dorice [name], MD Primary Cardiologist: Dr. Teofilo [name] [facility]) and Dr. Wilford [name] ([facility])   Patient Profile    [name] is a 76 y.o. male with past medical history of CAD (s/p CABG in 2011, stent to RCA in 07/2016 in the setting of an NSTEMI), chronic systolic CHF (EF 08% by echo in 10/2016), ischemic cardiomyopathy (s/p [employer] ICD placement in 11/2016), HTN, HLD, ESRD (on peritoneal dialysis), and PAD (s/p left peroneal artery revascularization and amputation of left 3rd metatarsal in 11/2016) who is being seen today for the evaluation of chest pain at the request of Dr. [name] [name].   History of Present Illness    Mr. [name] was initially admitted to [name] on 12/15/2016 under Vascular Surgery's service for critical left lower extremity ischemia and non-healing of his recent amputation site. He went to the OR on 9/6 with open repair ot a right femoral pseudoaneurysm and cannulation of the left anterior tibial artery with atherectomy and balloon angioplasty with resection of the left 3rd metatarsal head. Went back to the OR on 9/14 for left transmetatarsal amputation. He progressed well and was discharged to inpatient rehabilitation.   Starting on 01/01/2017, he was noted to have fevers with a leukocytosis and tachycardia. Due to concern for infection, he was transferred back to the medical floor. He has noticed worsening left foot pain and skin changes with serous drainage. Started on antibiotic therapy with Ceftazidime and Daptomycin with WBC count trending down to 8.0 on most recent check. Orthopedics has been consulted and he is possibly going back to the OR later today.   This morning, he reported having bilateral jaw pain. Starting around 1000 the pain radiated into his chest. He describes this as a pressure along his  sternum which was initially relieved with SL NTG but had represented at the time of this encounter. Notes significant nausea and diaphoresis. No dyspnea. He is unsure if this is similar to his prior MI's as his main symptom when he required CABG was dyspnea but he did have chest pain with his NSTEMI earlier this year.   Recent labs show WBC of 8.0, Hgb 8.6 (baseline 9.0-10.0), platelets 410. Na+ 131, K+ 3.3, creatinine 6.47. EKG shows NSR, HR 100, with lateral ST depression   Past Medical History   Past Medical History:  Diagnosis Date  . AICD (automatic cardioverter/defibrillator) present   . CHF (congestive heart failure) (HCC)   . COPD (chronic obstructive pulmonary disease) (HCC)   . Coronary artery disease    CABG 2011  . Diabetes mellitus without complication (HCC)   . ESRD on peritoneal dialysis [facility])    Started PD Dec 2016 in [place]. Nephrology is in [place].   Joshua Kitchen History of peritonitis    PD cath related peritonitis in April 2017  . Hypertension   . Peripheral vascular disease (HCC)   . Sjogren's syndrome (HCC)    Per pt's wife, was diagnosed in 2016 during w/u for cause of renal failure prior to starting dialysis.  He had a rash on his chest apparently prompting this w/u.  Never had renal bx.  He was referred to a rheum MD in [place] and per the wife he was treated with MTX and other medications but had side effects to "all of it" and isn't taking anything for this as of Jun 2017.   Joshua Kitchen  Stroke [facility])      Allergies:   Allergies  Allergen Reactions  . Lisinopril Hives  . Methotrexate Derivatives Other (See Comments)    blisters  . Sulfa Antibiotics Hives  . Tape Hives  . Vancomycin Other (See Comments)    Blisters   . Zanaflex [Tizanidine Hcl]     Hypotensive stroke    Home Medications:   Home Medications:  Prior to Admission medications   Medication Sig Start Date End Date Taking? Authorizing Provider  aspirin EC 81 MG tablet Take 81 mg by mouth daily.     [provider]  atorvastatin (LIPITOR) 80 MG tablet Take 80 mg by mouth daily at 6 PM.    [provider]  calcium acetate (PHOSLO) 667 MG capsule Take 667 mg by mouth 3 (three) times daily with meals.    [provider]  carvedilol (COREG) 12.5 MG tablet Take 12.5 mg by mouth 2 (two) times daily with a meal.    [provider]  clopidogrel (PLAVIX) 75 MG tablet Take 75 mg by mouth daily.    [provider]  docusate sodium (COLACE) 100 MG capsule Take 100 mg by mouth 2 (two) times daily.     [provider]  insulin NPH Human (HUMULIN N,NOVOLIN N) 100 UNIT/ML injection Inject 70 Units into the skin at bedtime.    [provider]  insulin regular (NOVOLIN R,HUMULIN R) 100 units/mL injection Inject 20 Units into the skin 3 (three) times daily before meals.    [provider]  isosorbide mononitrate (IMDUR) 30 MG 24 hr tablet Take 30 mg by mouth daily.    [provider]  nitroGLYCERIN (NITROSTAT) 0.4 MG SL tablet Place 0.4 mg under the tongue every 5 (five) minutes as needed for chest pain.    [provider]  ondansetron (ZOFRAN) 4 MG tablet Take 4 mg by mouth every 6 (six) hours as needed for nausea or vomiting.    [provider]  oxyCODONE-acetaminophen (PERCOCET/ROXICET) 5-325 MG tablet Take 1 tablet by mouth every 6 (six) hours as needed for severe pain. 11/30/16   Joshua Gurney, PA-C  Vitamin D, Ergocalciferol, (DRISDOL) 50000 units CAPS capsule Take 50,000 Units by mouth every 30 (thirty) days.     [provider]    Inpatient Medications    Scheduled Meds: . calcium acetate  667 mg Oral TID WC  . carvedilol  12.5 mg Oral QHS  . celecoxib  100 mg Oral Daily  . clopidogrel  75 mg Oral Daily  . [START ON 01/12/2017] darbepoetin (ARANESP) injection - DIALYSIS  100 mcg Subcutaneous Q Thu-HD  . diclofenac sodium  2 g Topical QID  . docusate sodium  100 mg Oral BID  . feeding  supplement (NEPRO CARB STEADY)  237 mL Oral BID BM  . gentamicin cream   Topical TID  . heparin  5,000 Units Subcutaneous Q12H  . insulin aspart  0-9 Units Subcutaneous TID WC  . insulin NPH Human  40 Units Subcutaneous QAC breakfast  . insulin NPH Human  45 Units Subcutaneous QHS  . isosorbide mononitrate  30 mg Oral Daily  . midodrine  5 mg Oral BID WC  . multivitamin  1 tablet Oral QHS  . nitroGLYCERIN  0.2 mg Transdermal Daily  . senna  1 tablet Oral BID  . sodium chloride flush  3 mL Intravenous Q12H  . Vitamin D (Ergocalciferol)  50,000 Units Oral Q30 days   Continuous Infusions: . sodium  chloride    . cefTAZidime (FORTAZ)  IV Stopped (01/08/17 0455)  . DAPTOmycin (CUBICIN)  IV Stopped (01/06/17 2321)  . dialysis solution 1.5% low-MG/low-CA    . dialysis solution 2.5% low-MG/low-CA     PRN Meds: sodium chloride, acetaminophen **OR** acetaminophen, dianeal solution for CAPD/CCPD with heparin, nitroGLYCERIN, ondansetron, oxyCODONE-acetaminophen, sodium chloride flush  Family History    Family History  Problem Relation Age of Onset  . Heart disease Father     Social History    Social History   Social History  . Marital status: Married    Spouse name: N/A  . Number of children: N/A  . Years of education: N/A   Occupational History  . Not on file.   Social History Main Topics  . Smoking status: Former Games developer  . Smokeless tobacco: Never Used  . Alcohol use No  . Drug use: No  . Sexual activity: No   Other Topics Concern  . Not on file   Social History Narrative  . No narrative on file     Review of Systems    General:  No chills, fever, night sweats or weight changes.  Cardiovascular:  No dyspnea on exertion, edema, orthopnea, palpitations, paroxysmal nocturnal dyspnea. Positive for chest pain and dyspnea.  Dermatological: No rash, lesions/masses Respiratory: No cough, dyspnea Urologic: No hematuria, dysuria Abdominal:   No nausea, vomiting, diarrhea,  bright red blood per rectum, melena, or hematemesis Neurologic:  No visual changes, wkns, changes in mental status. All other systems reviewed and are otherwise negative except as noted above.  Physical Exam/Data    Blood pressure (!) 136/58, pulse (!) 102, temperature 98.1 F (36.7 C), resp. rate 20, weight 200 lb 13.4 oz (91.1 kg), SpO2 97 %.  General: Pleasant Caucasian male appearing diaphoretic and reporting active pain.  Psych: Normal affect. Neuro: Alert and oriented X 3. Moves all extremities spontaneously. HEENT: Normal  Neck: Supple without bruits or JVD. Lungs:  Resp regular and unlabored, CTA without wheezing or rales. Heart: RRR no s3, s4, or murmurs. Abdomen: Soft, non-tender, non-distended, BS + x 4.  Extremities: No clubbing, cyanosis or edema. DP/PT/Radials 2+ on right. Left foot in bandage.    EKG:  The EKG was personally reviewed and demonstrates:NSR, HR 100, with lateral ST depression  Telemetry:  Telemetry was personally reviewed and demonstrates: Not currently connected to telemetry.   Labs/Studies     Relevant CV Studies:  Echocardiogram: 10/2016 INTERPRETATION  MODERATE LV DYSFUNCTION (See above) WITH MILD LVH NORMAL RIGHT VENTRICULAR SYSTOLIC FUNCTION VALVULAR REGURGITATION: TRIVIAL AR, TRIVIAL MR, TRIVIAL PR, TRIVIAL TR NO VALVULAR STENOSIS LIMITED EXAM TO ASSESS FUNCTION  Laboratory Data:  Chemistry Recent Labs Lab 01/06/17 0526 01/06/17 1915 01/07/17 0948 01/08/17 0359  NA 133*  --  132* 131*  K 3.6  --  3.2* 3.3*  CL 96*  --  93* 92*  CO2 25  --  29 28  GLUCOSE 111*  --  218* 344*  BUN 53*  --  53* 53*  CREATININE 6.27* 6.87* 6.31* 6.47*  CALCIUM 8.4*  --  8.2* 8.1*  GFRNONAA 8* 7* 8* 7*  GFRAA 9* 8* 9* 9*  ANIONGAP 12  --  10 11     Recent Labs Lab 01/02/17 0756  01/06/17 0526 01/07/17 0948 01/08/17 0359  PROT 5.8*  --   --  5.6* 5.9*  ALBUMIN 1.7*  < > 1.6* 1.3* 1.3*  AST 25  --   --  41 79*  ALT 18  --   --  28 33  ALKPHOS 68  --   --  60 63  BILITOT 0.4  --   --  0.4 0.4  < > = values in this interval not displayed. Hematology Recent Labs Lab 01/06/17 0526 01/06/17 1915 01/08/17 0359  WBC 10.2 13.3* 8.0  RBC 3.17* 3.04* 2.80*  HGB 9.9* 9.4* 8.6*  HCT 30.3* 28.8* 26.5*  MCV 95.6 94.7 94.6  MCH 31.2 30.9 30.7  MCHC 32.7 32.6 32.5  RDW 14.9 14.4 14.6  PLT 389 425* 410*   Cardiac EnzymesNo results for input(s): TROPONINI in the last 168 hours. No results for input(s): TROPIPOC in the last 168 hours.  BNPNo results for input(s): BNP, PROBNP in the last 168 hours.  DDimer No results for input(s): DDIMER in the last 168 hours.  Radiology/Studies:  No results found.   Assessment & Plan    1. NSTEMI - this morning, the patient initially had bilateral jaw pain but developed a sternal chest pressure with significant diaphoresis and nausea. Symptoms relieved with SL NTG. Pain represented and is now a 7/10. EKG shows NSR, HR 100, with lateral ST depression which is noted on prior EKG's in 2017 but new since his tracing on 12/15/2016. - initial troponin is elevated at 0.27. Deseri Loss continue to cycle enzymes to determine a true NSTEMI vs. demand ischemia in the setting of his acute illness but his symptoms are certainly concerning for an NSTEMI. Kosei Rhodes start Heparin and SL NTG. Further recommendations pending troponin trend. Continue BB and Plavix.   2. CAD - s/p CABG in 2011 with recent stent to RCA in 07/2016 in the setting of an NSTEMI. Unable to locate full cath report in Care Everywhere. - cycle cardiac enzymes as above.  - continue Plavix and BB.  3. Chronic systolic CHF/ Ischemic Cardiomyopathy - EF 35% by echo in 10/2016 and recent placement of [employer] ICD in 11/2016. - he does have mild swelling along his right arm but lungs are clear and no lower extremity edema is appreciated.  - monitor volume status closely.   4. HTN - continue BB. Theodus Ran stop Imdur with initiation of NTG  drip.   5. ESRD - on peritoneal dialysis. - Nephrology following.   6. PAD - s/p left peroneal artery revascularization and amputation of left 3rd metatarsal in 11/2016 - recent left transmetatarsal amputation on 9/14. Orthopedics to see tomorrow. Confirmed with Hospitalist that they did not want Plavix held for possible upcoming surgery but at this time his surgery might have to be postponed pending further cardiac evaluation as above.   Signed, Ellsworth Lennox, PA-C 01/08/2017, 12:18 PM Pager: [phone]  I have seen and examined this patient with [place].  Agree with above, note added to reflect my findings.  On exam, RRR, no murmurs, lungs clear. Presented to the hospital with left foot infection status post partial amputation with plans for BKA in the future. Developed jaw pain this morning that has transitioned into chest pain today. Associated with diaphoresis and shortness of breath. He is also tachycardic. Troponin was checked that was elevated at 0.27. EKG shows ST depressions. He does have a history of coronary disease status post CABG with a stent to the RCA in April 2018.  Pain relieved with nitroglycerin. We'll plan to transfer to stepdown, start a heparin drip as well as a nitroglycerin drip. He is currently on both aspirin and Plavix. Timing of cardiac procedures and including heart catheterization Damontae Loppnow need to be determined based on surgical  need for amputation.  Sofie Schendel M. Naamah Boggess MD 01/08/2017 2:10 PM

## 2017-01-08 NOTE — Progress Notes (Signed)
CRITICAL VALUE ALERT  Critical Value:  Troponin 0.27ng/ml  Date & Time Notied:  01/08/17 1305  Provider Notified: [name] Mcalpine  Orders Received/Actions taken: Cardiology is at beside and notified of this result as well.

## 2017-01-08 NOTE — Progress Notes (Signed)
Subjective:   Has CP, diaphoresis and  positive trop, being moved to stepdown again - possible surgical intervention on transmet/BKA in plans this week.  No issues with PD- he looks better today- no further drops in BP - actually now BP is up - he denies SOB   Objective Vital signs in last 24 hours: Vitals:   01/08/17 0504 01/08/17 1022 01/08/17 1143 01/08/17 1147  BP: (!) 154/58 (!) 152/52  (!) 136/58  Pulse: 89 (!) 104  (!) 102  Resp: Temp: 98.6 F (37 C) 100.1 F (37.8 C) 98.1 F (36.7 C) 98.1 F (36.7 C)  TempSrc: Oral Oral Oral   SpO2: 100% 96%  97%  Weight:       Weight change: 0.3 kg (10.6 oz)  Intake/Output Summary (Last 24 hours) at 01/08/17 1324 Last data filed at 01/08/17 0830  Gross per 24 hour  Intake            12668 ml  Output            12155 ml  Net              513 ml    Dialysis Orders: [facility]: [facility]. 6 exchanges/day one mid-day exchange (occurs aroudn 9am). 2.5 liters of 2.5% per CCPD and 2 liters of 2.5% for last fill and mid day drain.  1. 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  S/p L transmet  12/23/16 with Dr. Lajoyce Corners.  On [name]- probably will now need BKA 2. ESRD- CCPD, using 2.5% dextrose; To facilitate CIR was doing: CCPD 2.5L x 5 exchanges, , Dry Day.  At DC can resume prev PD Rx. have  dec fluids to 1/2 1.5% and 1/2 2.5 % with good results, now BP is higher and no drops so will inc back to 2.5%.  3. Hypertension/volume- BP stable- hyponatremia argues for volume overload but exam does not-  Was on all 1.5% fluid- used 2.5% fluid- coreg decreased to at night only - he seems to be a little orthostatic- most recently changed fluids to 1/2 1.5 and 1/2 2.5- started midodrine for orthostatic hypotension - now BP up- change back to all 2.5 % - cont midodrine for now 4. Anemia (ABLA)- TSAT 45% and Ferritin 2284, cont Aranesp 100 qThurs-  Was trending up - most recently  down 5. Constipation: miralax as needed 6. Hypokalemia: was on supplementation, Now stopped. Follow trend. Given 40 today 7. Myoclonus / Confusion: stop gabapentin and robaxin, limit narcotics, follow 8. Bones- cont renavite [name] - P ok. He does not want renal diet, phos and K OK- will Liberalize diet 9. Hyperglycemia- may need more insulin 10. Low grade temp- blood cultures neg to date and fortaz/dapto-  fluid cell count fine - foot thought to be source- likely will need BKA 11. Now with CP- pos troponin- per cards   Annie Sable, MD 01/08/2017       Labs: Basic Metabolic Panel:  Recent Labs Lab 01/06/17 0526 01/06/17 1915 01/07/17 0948 01/08/17 0359  NA 133*  --  132* 131*  K 3.6  --  3.2* 3.3*  CL 96*  --  93* 92*  CO2 25  --  29 28  GLUCOSE 111*  --  218* 344*  BUN 53*  --  53* 53*  CREATININE 6.27* 6.87* 6.31* 6.47*  CALCIUM 8.4*  --  8.2* 8.1*  PHOS 3.6  --  3.8 3.6  Liver Function Tests:  Recent Labs Lab 01/02/17 0756  01/06/17 0526 01/07/17 0948 01/08/17 0359  AST 25  --   --  41 79*  ALT 18  --   --  28 33  ALKPHOS 68  --   --  60 63  BILITOT 0.4  --   --  0.4 0.4  PROT 5.8*  --   --  5.6* 5.9*  ALBUMIN 1.7*  < > 1.6* 1.3* 1.3*  < > = values in this interval not displayed. No results for input(s): LIPASE, AMYLASE in the last 168 hours. No results for input(s): AMMONIA in the last 168 hours. CBC:  Recent Labs Lab 01/02/17 0756 01/04/17 0558 01/06/17 0526 01/06/17 1915 01/08/17 0359  WBC 11.5* 10.2 10.2 13.3* 8.0  NEUTROABS 8.7*  --   --   --  6.1  HGB 9.5* 9.4* 9.9* 9.4* 8.6*  HCT 29.5* 29.0* 30.3* 28.8* 26.5*  MCV 96.1 95.4 95.6 94.7 94.6  PLT 428* 477* 389 425* 410*   Cardiac Enzymes:  Recent Labs Lab 01/07/17 0948 01/08/17 1056  CKTOTAL 52  --   TROPONINI  --  0.27*   CBG:  Recent Labs Lab 01/05/17 1722 01/05/17 2110 01/06/17 0922 01/06/17 1123 01/06/17 1655  GLUCAP 201* 274* 92 138* 114*    Iron Studies:  No results for input(s): IRON, TIBC, TRANSFERRIN, FERRITIN in the last 72 hours. Studies/Results: No results found. Medications: Infusions: . sodium chloride    . cefTAZidime (FORTAZ)  IV Stopped (01/08/17 0455)  . DAPTOmycin (CUBICIN)  IV Stopped (01/06/17 2321)  . dialysis solution 1.5% low-MG/low-CA    . dialysis solution 2.5% low-MG/low-CA      Scheduled Medications: . calcium acetate  667 mg Oral TID WC  . carvedilol  12.5 mg Oral QHS  . celecoxib  100 mg Oral Daily  . clopidogrel  75 mg Oral Daily  . [START ON 01/12/2017] darbepoetin (ARANESP) injection - DIALYSIS  100 mcg Subcutaneous Q Thu-HD  . diclofenac sodium  2 g Topical QID  . docusate sodium  100 mg Oral BID  . feeding supplement (NEPRO CARB STEADY)  237 mL Oral BID BM  . gentamicin cream   Topical TID  . heparin  5,000 Units Subcutaneous Q12H  . insulin aspart  0-9 Units Subcutaneous TID WC  . insulin NPH Human  40 Units Subcutaneous QAC breakfast  . insulin NPH Human  45 Units Subcutaneous QHS  . isosorbide mononitrate  30 mg Oral Daily  . midodrine  5 mg Oral BID WC  . multivitamin  1 tablet Oral QHS  . nitroGLYCERIN  0.2 mg Transdermal Daily  . senna  1 tablet Oral BID  . sodium chloride flush  3 mL Intravenous Q12H  . Vitamin D (Ergocalciferol)  50,000 Units Oral Q30 days    have reviewed scheduled and prn medications.  Physical Exam: General: WNWD male, lying in bed  Heart: RRR Lungs: breathing unlabored CTAB  Abdomen: obese, soft, non tender Extremities: 1+ edema Dialysis Access: PD cath

## 2017-01-08 NOTE — Progress Notes (Signed)
ANTICOAGULATION CONSULT NOTE - Follow Up Consult  Pharmacy Consult for Heparin  Indication: chest pain/ACS  Allergies  Allergen Reactions  . Lisinopril Hives  . Methotrexate Derivatives Other (See Comments)    blisters  . Sulfa Antibiotics Hives  . Tape Hives  . Vancomycin Other (See Comments)    Blisters   . Zanaflex [Tizanidine Hcl]     Hypotensive stroke   Patient Measurements: Height:  (175.3 cm) Weight: 206 lb 2.1 oz (93.5 kg) IBW/kg (Calculated) : 70.7  Vital Signs: Temp: 97.9 F (36.6 C) (09/30 2324) Temp Source: Axillary (09/30 2324) BP: 126/52 (09/30 2324) Pulse Rate: 80 (09/30 1957)  Labs:  Recent Labs  01/06/17 0526 01/06/17 1915 01/07/17 0948 01/08/17 0359 01/08/17 1056 01/08/17 1604 01/08/17 2227  HGB 9.9* 9.4*  --  8.6*  --   --   --   HCT 30.3* 28.8*  --  26.5*  --   --   --   PLT 389 425*  --  410*  --   --   --   HEPARINUNFRC  --   --   --   --   --   --  0.28*  CREATININE 6.27* 6.87* 6.31* 6.47*  --   --   --   CKTOTAL  --   --  52  --   --   --   --   TROPONINI  --   --   --   --  0.27* 0.51* 1.73*    Estimated Creatinine Clearance: 11.1 mL/min (A) (by C-G formula based on SCr of 6.47 mg/dL (H)).  Assessment: 76 y/o M on heparin for rising troponin. Initial heparin level is sub-therapeutic tonight. No issues per RN.   Goal of Therapy:  Heparin level 0.3-0.7 units/ml Monitor platelets by anticoagulation protocol: Yes   Plan:  -Inc heparin to 1300 units/hr -0800 HL  [name], [name] 01/08/2017,11:27 PM

## 2017-01-08 NOTE — Progress Notes (Signed)
CRITICAL VALUE ALERT  Critical Value:  Troponin 0.51  Date & Time Notified:  01/08/17 1734   Provider Notified: Dr. [name] Mcalpine  Orders Received/Actions taken: No new orders patient currently on heparin gtt and nitro gtt.

## 2017-01-08 NOTE — Progress Notes (Signed)
ANTICOAGULATION CONSULT NOTE - Initial Consult  Pharmacy Consult for Heparin Indication: chest pain/ACS  Allergies  Allergen Reactions  . Lisinopril Hives  . Methotrexate Derivatives Other (See Comments)    blisters  . Sulfa Antibiotics Hives  . Tape Hives  . Vancomycin Other (See Comments)    Blisters   . Zanaflex [Tizanidine Hcl]     Hypotensive stroke    Patient Measurements: Weight: 200 lb 13.4 oz (91.1 kg) Heparin Dosing Weight: 89.2kg  Vital Signs: Temp: 98.1 F (36.7 C) (09/30 1147) Temp Source: Oral (09/30 1143) BP: 136/58 (09/30 1147) Pulse Rate: 102 (09/30 1147)  Labs:  Recent Labs  01/06/17 0526 01/06/17 1915 01/07/17 0948 01/08/17 0359 01/08/17 1056  HGB 9.9* 9.4*  --  8.6*  --   HCT 30.3* 28.8*  --  26.5*  --   PLT 389 425*  --  410*  --   CREATININE 6.27* 6.87* 6.31* 6.47*  --   CKTOTAL  --   --  52  --   --   TROPONINI  --   --   --   --  0.27*    Estimated Creatinine Clearance: 11 mL/min (A) (by C-G formula based on SCr of 6.47 mg/dL (H)).   Medical History: Past Medical History:  Diagnosis Date  . AICD (automatic cardioverter/defibrillator) present   . CHF (congestive heart failure) (HCC)   . COPD (chronic obstructive pulmonary disease) (HCC)   . Coronary artery disease    CABG 2011  . Diabetes mellitus without complication (HCC)   . ESRD on peritoneal dialysis [facility])    Started PD Dec 2016 in [place]. Nephrology is in [place].   [name] History of peritonitis    PD cath related peritonitis in April 2017  . Hypertension   . Peripheral vascular disease (HCC)   . Sjogren's syndrome (HCC)    Per pt's wife, was diagnosed in 2016 during w/u for cause of renal failure prior to starting dialysis.  He had a rash on his chest apparently prompting this w/u.  Never had renal bx.  He was referred to a rheum MD in [name] and per the wife he was treated with MTX and other medications but had side effects to "all of it" and isn't taking anything for  this as of Jun 2017.   . Stroke [facility])     Assessment: 42 yoM originally transferred back from CIR for worsening LLE infection, now with chest pain and elevated troponin. Pharmacy consulted to dose IV heparin for ACS/STEMI. Pt has been receiving heparin 5000 units SQ prophylaxis, last dose 9/30 @ 0913. H&H low, pt with anemia of CKD on Aranesp qThurs (next dose 10/4). Pltc 410. No bleeding noted.  Goal of Therapy:  Heparin level 0.3-0.7 units/ml Monitor platelets by anticoagulation protocol: Yes   Plan:  No heparin bolus 2/2 ppx dosing Start heparin infusion at 1200 units/hr Check anti-Xa level in 8 hours and daily while on heparin Continue to monitor H&H and platelets   Erin N. Zigmund Daniel, PharmD PGY1 Pharmacy Resident Pager: [phone]

## 2017-01-09 ENCOUNTER — Inpatient Hospital Stay (HOSPITAL_COMMUNITY): Payer: Medicare Other | Admitting: Occupational Therapy

## 2017-01-09 ENCOUNTER — Inpatient Hospital Stay (HOSPITAL_COMMUNITY): Payer: Medicare Other

## 2017-01-09 ENCOUNTER — Inpatient Hospital Stay (HOSPITAL_COMMUNITY)
Admission: AD | Disposition: A | Discharge status: Home / Self Care | Payer: Self-pay | Source: Intra-hospital | Attending: Family Medicine

## 2017-01-09 ENCOUNTER — Encounter (HOSPITAL_COMMUNITY): Payer: Self-pay

## 2017-01-09 DIAGNOSIS — R7401 Elevation of levels of liver transaminase levels: Secondary | ICD-10-CM

## 2017-01-09 DIAGNOSIS — R079 Chest pain, unspecified: Secondary | ICD-10-CM

## 2017-01-09 DIAGNOSIS — I214 Non-ST elevation (NSTEMI) myocardial infarction: Secondary | ICD-10-CM

## 2017-01-09 DIAGNOSIS — R740 Nonspecific elevation of levels of transaminase and lactic acid dehydrogenase [LDH]: Secondary | ICD-10-CM

## 2017-01-09 HISTORY — PX: LEFT HEART CATH AND CORS/GRAFTS ANGIOGRAPHY: CATH118250

## 2017-01-09 LAB — COMPREHENSIVE METABOLIC PANEL
ALT: 53 U/L (ref 17–63)
AST: 269 U/L — ABNORMAL HIGH (ref 15–41)
Albumin: 1.4 g/dL — ABNORMAL LOW (ref 3.5–5.0)
Alkaline Phosphatase: 67 U/L (ref 38–126)
Anion gap: 11 (ref 5–15)
BUN: 46 mg/dL — ABNORMAL HIGH (ref 6–20)
CHLORIDE: 95 mmol/L — AB (ref 101–111)
CO2: 26 mmol/L (ref 22–32)
CREATININE: 6.22 mg/dL — AB (ref 0.61–1.24)
Calcium: 8.1 mg/dL — ABNORMAL LOW (ref 8.9–10.3)
GFR calc non Af Amer: 8 mL/min — ABNORMAL LOW (ref >60)
GFR, EST AFRICAN AMERICAN: 9 mL/min — AB (ref >60)
GLUCOSE: 167 mg/dL — AB (ref 65–99)
Potassium: 3.4 mmol/L — ABNORMAL LOW (ref 3.5–5.1)
SODIUM: 132 mmol/L — AB (ref 135–145)
Total Bilirubin: 0.2 mg/dL — ABNORMAL LOW (ref 0.3–1.2)
Total Protein: 5.9 g/dL — ABNORMAL LOW (ref 6.5–8.1)

## 2017-01-09 LAB — CBC WITH DIFFERENTIAL/PLATELET
Basophils Absolute: 0 10*3/uL (ref 0.0–0.1)
Basophils Relative: 0 %
EOS ABS: 0.2 10*3/uL (ref 0.0–0.7)
Eosinophils Relative: 2 %
HCT: 28.6 % — ABNORMAL LOW (ref 39.0–52.0)
HEMOGLOBIN: 9.4 g/dL — AB (ref 13.0–17.0)
LYMPHS ABS: 1.4 10*3/uL (ref 0.7–4.0)
LYMPHS PCT: 12 %
MCH: 30.7 pg (ref 26.0–34.0)
MCHC: 32.9 g/dL (ref 30.0–36.0)
MCV: 93.5 fL (ref 78.0–100.0)
MONOS PCT: 6 %
Monocytes Absolute: 0.7 10*3/uL (ref 0.1–1.0)
NEUTROS ABS: 9.3 10*3/uL — AB (ref 1.7–7.7)
NEUTROS PCT: 80 %
Platelets: 461 10*3/uL — ABNORMAL HIGH (ref 150–400)
RBC: 3.06 MIL/uL — AB (ref 4.22–5.81)
RDW: 14.7 % (ref 11.5–15.5)
WBC: 11.5 10*3/uL — AB (ref 4.0–10.5)

## 2017-01-09 LAB — GLUCOSE, CAPILLARY
GLUCOSE-CAPILLARY: 186 mg/dL — AB (ref 65–99)
GLUCOSE-CAPILLARY: 221 mg/dL — AB (ref 65–99)
GLUCOSE-CAPILLARY: 146 mg/dL — AB (ref 65–99)
GLUCOSE-CAPILLARY: 147 mg/dL — AB (ref 65–99)
GLUCOSE-CAPILLARY: 93 mg/dL (ref 65–99)
GLUCOSE-CAPILLARY: 65 mg/dL (ref 65–99)
Glucose-Capillary: 144 mg/dL — ABNORMAL HIGH (ref 65–99)
Glucose-Capillary: 268 mg/dL — ABNORMAL HIGH (ref 65–99)
Glucose-Capillary: 327 mg/dL — ABNORMAL HIGH (ref 65–99)
Glucose-Capillary: 287 mg/dL — ABNORMAL HIGH (ref 65–99)
Glucose-Capillary: 54 mg/dL — ABNORMAL LOW (ref 65–99)
Glucose-Capillary: 144 mg/dL — ABNORMAL HIGH (ref 65–99)
Glucose-Capillary: 117 mg/dL — ABNORMAL HIGH (ref 65–99)

## 2017-01-09 LAB — PROTIME-INR
INR: 1.2
PROTHROMBIN TIME: 15.2 s (ref 11.4–15.2)

## 2017-01-09 LAB — CULTURE, BLOOD (ROUTINE X 2)
CULTURE: NO GROWTH
CULTURE: NO GROWTH
Special Requests: ADEQUATE
Special Requests: ADEQUATE

## 2017-01-09 LAB — HEPARIN LEVEL (UNFRACTIONATED)
HEPARIN UNFRACTIONATED: 0.5 [IU]/mL (ref 0.30–0.70)
Heparin Unfractionated: 0.32 IU/mL (ref 0.30–0.70)

## 2017-01-09 LAB — PHOSPHORUS: PHOSPHORUS: 3.1 mg/dL (ref 2.5–4.6)

## 2017-01-09 LAB — MAGNESIUM: MAGNESIUM: 1.6 mg/dL — AB (ref 1.7–2.4)

## 2017-01-09 LAB — POCT ACTIVATED CLOTTING TIME: ACTIVATED CLOTTING TIME: 120 s

## 2017-01-09 SURGERY — LEFT HEART CATH AND CORS/GRAFTS ANGIOGRAPHY
Anesthesia: LOCAL

## 2017-01-09 MED ORDER — DEXTROSE 50 % IV SOLN
INTRAVENOUS | Status: AC
  Administered 2017-01-09: 17:00:00
  Filled 2017-01-09: qty 50

## 2017-01-09 MED ORDER — ASPIRIN 81 MG PO CHEW
81.0000 mg | CHEWABLE_TABLET | ORAL | Status: AC
  Administered 2017-01-09: 81 mg via ORAL

## 2017-01-09 MED ORDER — IOPAMIDOL (ISOVUE-370) INJECTION 76%
INTRAVENOUS | Status: AC
  Filled 2017-01-09: qty 125

## 2017-01-09 MED ORDER — FENTANYL CITRATE (PF) 100 MCG/2ML IJ SOLN
INTRAMUSCULAR | Status: AC
Start: 2017-01-09 — End: 2017-01-09
  Filled 2017-01-09: qty 2

## 2017-01-09 MED ORDER — SODIUM CHLORIDE 0.9% FLUSH: 3.0000 mL | INTRAVENOUS | Status: DC | PRN

## 2017-01-09 MED ORDER — LIDOCAINE HCL 2 % IJ SOLN
INTRAMUSCULAR | Status: AC
  Filled 2017-01-09: qty 10

## 2017-01-09 MED ORDER — POTASSIUM CHLORIDE 10 MEQ/100ML IV SOLN
10.0000 meq | INTRAVENOUS | Status: AC
  Administered 2017-01-09: 10 meq via INTRAVENOUS

## 2017-01-09 MED ORDER — CLOPIDOGREL BISULFATE 75 MG PO TABS
75.0000 mg | ORAL_TABLET | Freq: Every day | ORAL | Status: DC
  Administered 2017-01-10 – 2017-01-17 (×7): 75 mg via ORAL
  Filled 2017-01-09 (×8): qty 1

## 2017-01-09 MED ORDER — DARBEPOETIN ALFA 200 MCG/0.4ML IJ SOSY: 200.0000 ug | PREFILLED_SYRINGE | INTRAMUSCULAR | Status: DC

## 2017-01-09 MED ORDER — SODIUM CHLORIDE 0.9 % IV SOLN: 250.0000 mL | INTRAVENOUS | Status: DC | PRN

## 2017-01-09 MED ORDER — MAGNESIUM SULFATE IN D5W 1-5 GM/100ML-% IV SOLN
1.0000 g | Freq: Once | INTRAVENOUS | Status: AC
  Administered 2017-01-09: 1 g via INTRAVENOUS
  Filled 2017-01-09: qty 100

## 2017-01-09 MED ORDER — SODIUM CHLORIDE 0.9% FLUSH
3.0000 mL | Freq: Two times a day (BID) | INTRAVENOUS | Status: DC
  Administered 2017-01-10 – 2017-01-17 (×6): 3 mL via INTRAVENOUS

## 2017-01-09 MED ORDER — SODIUM CHLORIDE 0.9 % IV SOLN: INTRAVENOUS | Status: AC

## 2017-01-09 MED ORDER — ONDANSETRON HCL 4 MG/2ML IJ SOLN
4.0000 mg | Freq: Four times a day (QID) | INTRAMUSCULAR | Status: DC | PRN
  Filled 2017-01-09: qty 2

## 2017-01-09 MED ORDER — ASPIRIN EC 81 MG PO TBEC: 81.0000 mg | DELAYED_RELEASE_TABLET | Freq: Every day | ORAL | Status: DC

## 2017-01-09 MED ORDER — MIDAZOLAM HCL 2 MG/2ML IJ SOLN
INTRAMUSCULAR | Status: DC | PRN
  Administered 2017-01-09: 1 mg via INTRAVENOUS

## 2017-01-09 MED ORDER — IOPAMIDOL (ISOVUE-370) INJECTION 76%
INTRAVENOUS | Status: DC | PRN
  Administered 2017-01-09: 145 mL via INTRA_ARTERIAL

## 2017-01-09 MED ORDER — PRASUGREL HCL 10 MG PO TABS
ORAL_TABLET | ORAL | Status: AC
  Filled 2017-01-09: qty 1

## 2017-01-09 MED ORDER — FENTANYL CITRATE (PF) 100 MCG/2ML IJ SOLN
INTRAMUSCULAR | Status: DC | PRN
  Administered 2017-01-09: 25 ug via INTRAVENOUS

## 2017-01-09 MED ORDER — LIDOCAINE HCL (PF) 1 % IJ SOLN
INTRAMUSCULAR | Status: DC | PRN
  Administered 2017-01-09: 15 mL via INTRADERMAL

## 2017-01-09 MED ORDER — ASPIRIN 81 MG PO CHEW
81.0000 mg | CHEWABLE_TABLET | Freq: Every day | ORAL | Status: DC
  Administered 2017-01-10 – 2017-01-17 (×7): 81 mg via ORAL
  Filled 2017-01-09 (×6): qty 1

## 2017-01-09 MED ORDER — MIDAZOLAM HCL 2 MG/2ML IJ SOLN
INTRAMUSCULAR | Status: AC
  Filled 2017-01-09: qty 2

## 2017-01-09 MED ORDER — MORPHINE SULFATE (PF) 2 MG/ML IV SOLN
2.0000 mg | INTRAVENOUS | Status: DC | PRN
  Administered 2017-01-11: 2 mg via INTRAVENOUS
  Filled 2017-01-09: qty 1

## 2017-01-09 MED ORDER — SODIUM CHLORIDE 0.9% FLUSH
3.0000 mL | Freq: Two times a day (BID) | INTRAVENOUS | Status: DC
  Administered 2017-01-09: 3 mL via INTRAVENOUS

## 2017-01-09 MED ORDER — HEPARIN (PORCINE) IN NACL 2-0.9 UNIT/ML-% IJ SOLN
INTRAMUSCULAR | Status: AC
  Filled 2017-01-09: qty 1000

## 2017-01-09 MED ORDER — SODIUM CHLORIDE 0.9 % IV SOLN
INTRAVENOUS | Status: DC
  Administered 2017-01-09: 09:00:00 via INTRAVENOUS

## 2017-01-09 MED ORDER — ASPIRIN 81 MG PO CHEW
CHEWABLE_TABLET | ORAL | Status: AC
  Administered 2017-01-09: 81 mg via ORAL
  Filled 2017-01-09: qty 1

## 2017-01-09 MED ORDER — HEPARIN (PORCINE) IN NACL 2-0.9 UNIT/ML-% IJ SOLN
INTRAMUSCULAR | Status: AC | PRN
  Administered 2017-01-09: 1000 mL

## 2017-01-09 MED ORDER — IOPAMIDOL (ISOVUE-370) INJECTION 76%
INTRAVENOUS | Status: AC
  Filled 2017-01-09: qty 50

## 2017-01-09 MED ORDER — HYDRALAZINE HCL 20 MG/ML IJ SOLN: 10.0000 mg | INTRAMUSCULAR | Status: DC | PRN

## 2017-01-09 MED ORDER — DEXTROSE 50 % IV SOLN
INTRAVENOUS | Status: AC
  Administered 2017-01-09: 50 mL
  Filled 2017-01-09: qty 50

## 2017-01-09 MED ORDER — ACETAMINOPHEN 325 MG PO TABS: 650.0000 mg | ORAL_TABLET | ORAL | Status: DC | PRN

## 2017-01-09 SURGICAL SUPPLY — 8 items
CATH INFINITI 5FR AL1 (CATHETERS) ×2 IMPLANT
CATH INFINITI 5FR MULTPACK ANG (CATHETERS) ×2 IMPLANT
KIT HEART LEFT (KITS) ×2 IMPLANT
PACK CARDIAC CATHETERIZATION (CUSTOM PROCEDURE TRAY) ×2 IMPLANT
SHEATH PINNACLE 5F 10CM (SHEATH) ×2 IMPLANT
SYR MEDRAD MARK V 150ML (SYRINGE) ×2 IMPLANT
TRANSDUCER W/STOPCOCK (MISCELLANEOUS) ×2 IMPLANT
WIRE EMERALD 3MM-J .035X150CM (WIRE) ×2 IMPLANT

## 2017-01-09 NOTE — Progress Notes (Addendum)
Spoke with Dr Duda He indicated surgery could wait 6 weeks Will arrange diagnostic cath today He has a fistula In LUE and hematoma from recent angio in Right femoral Best access may be left femoral has good pulse If needed plan would be BMS and postpone amputation for At least 6 weeks. He had peritoneal dialysis last night   [name]  

## 2017-01-09 NOTE — Progress Notes (Signed)
Patient ID: [name], male   DOB: April 16, 1940, 76 y.o.   MRN: [id number] Patient is seen in follow-up for transmetatarsal amputation on the left status post revascularization to left lower extremity. Of note patient has sustained an acute MI and will undergo cardiac catheterization for intervention.  Examination left foot patient has progressive dry gangrenous changes over the surgical incision the area of dry gangrene is approximately 3 x 5 cm. This appears superficial there is some mild redness around the wound but no ascending cellulitis no abscess no purulence.  At this point patient's dry gangrene of the foot can be treated symptomatically. Patient will need to undergo his cardiac treatment first and then postoperatively we will proceed with a transtibial amputation once patient is safe from a cardiac standpoint. This may be 4-6 weeks. I will follow-up as an outpatient.

## 2017-01-09 NOTE — Interval H&P Note (Signed)
Cath Lab Visit (complete for each Cath Lab visit)  Clinical Evaluation Leading to the Procedure:   ACS: Yes.    Non-ACS:    Anginal Classification: CCS III  Anti-ischemic medical therapy: Minimal Therapy (1 class of medications)  Non-Invasive Test Results: No non-invasive testing performed  Prior CABG: Previous CABG      History and Physical Interval Note:  01/09/2017 5:06 PM  [name]  has presented today for surgery, with the diagnosis of mi  The various methods of treatment have been discussed with the patient and family. After consideration of risks, benefits and other options for treatment, the patient has consented to  Procedure(s): LEFT HEART CATH AND CORS/GRAFTS ANGIOGRAPHY (N/A) as a surgical intervention .  The patient's history has been reviewed, patient examined, no change in status, stable for surgery.  I have reviewed the patient's chart and labs.  Questions were answered to the patient's satisfaction.     Nanetta Batty

## 2017-01-09 NOTE — Progress Notes (Signed)
Progress Note  Patient Name: [name] Date of Encounter: 01/09/2017  Primary Cardiologist: [facility]/ [facility]  Subjective   No chest pain Jaw still hurts LLE pain  Inpatient Medications    Scheduled Meds: . aspirin EC  81 mg Oral Daily  . calcium acetate  667 mg Oral TID WC  . carvedilol  12.5 mg Oral QHS  . celecoxib  100 mg Oral Daily  . clopidogrel  75 mg Oral Daily  . [START ON 01/12/2017] darbepoetin (ARANESP) injection - DIALYSIS  100 mcg Subcutaneous Q Thu-HD  . diclofenac sodium  2 g Topical QID  . docusate sodium  100 mg Oral BID  . feeding supplement (NEPRO CARB STEADY)  237 mL Oral BID BM  . gentamicin cream  1 application Topical Daily  . gentamicin cream   Topical TID  . insulin aspart  0-9 Units Subcutaneous TID WC  . insulin NPH Human  40 Units Subcutaneous QAC breakfast  . insulin NPH Human  45 Units Subcutaneous QHS  . midodrine  5 mg Oral BID WC  . multivitamin  1 tablet Oral QHS  . senna  1 tablet Oral BID  . sodium chloride flush  3 mL Intravenous Q12H  . Vitamin D (Ergocalciferol)  50,000 Units Oral Q30 days   Continuous Infusions: . sodium chloride 250 mL (01/08/17 2000)  . cefTAZidime (FORTAZ)  IV Stopped (01/08/17 2146)  . DAPTOmycin (CUBICIN)  IV Stopped (01/08/17 2116)  . dialysis solution 1.5% low-MG/low-CA    . dialysis solution 2.5% low-MG/low-CA    . heparin 1,300 Units/hr (01/08/17 2334)  . nitroGLYCERIN 5 mcg/min (01/08/17 1612)   PRN Meds: sodium chloride, acetaminophen **OR** acetaminophen, dianeal solution for CAPD/CCPD with heparin, nitroGLYCERIN, ondansetron, oxyCODONE-acetaminophen, sodium chloride flush   Vital Signs    Vitals:   01/08/17 1955 01/08/17 1957 01/08/17 2324 01/09/17 0309  BP: (!) 151/60 (!) 151/60 (!) 126/52 (!) 164/62  Pulse: 87 80    Resp:  (!) 22    Temp: 97.8 F (36.6 C)  97.9 F (36.6 C) 98.7 F (37.1 C)  TempSrc: Oral  Axillary Axillary  SpO2:      Weight:      Height:        Intake/Output  Summary (Last 24 hours) at 01/09/17 0748 Last data filed at 01/09/17 0400  Gross per 24 hour  Intake         12921.35 ml  Output            12155 ml  Net           766.35 ml   Filed Weights   01/07/17 0827 01/07/17 1835 01/08/17 1624  Weight: 198 lb 10.2 oz (90.1 kg) 200 lb 13.4 oz (91.1 kg) 206 lb 2.1 oz (93.5 kg)    Telemetry    NSR 01/09/2017  - Personally Reviewed  ECG    SR diffuse lateral ST depressoin  - Personally Reviewed  Physical Exam  Chronically ill white male  GEN: No acute distress.   Neck: No JVD Cardiac: RRR, no murmurs, rubs, or gallops.  Respiratory: Clear to auscultation bilaterally. GI: Soft, nontender, non-distended  MS: No edema; No deformity. 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   Labs    Chemistry Recent Labs Lab 01/02/17 0756  01/06/17 0526 01/06/17 1915 01/07/17 0948 01/08/17 0359  NA 127*  < > 133*  --  132* 131*  K 5.0  < > 3.6  --  3.2* 3.3*  CL 90*  < > 96*  --  93* 92*  CO2 26  < > 25  --  29 28  GLUCOSE 383*  < > 111*  --  218* 344*  BUN 47*  < > 53*  --  53* 53*  CREATININE 7.11*  < > 6.27* 6.87* 6.31* 6.47*  CALCIUM 8.3*  < > 8.4*  --  8.2* 8.1*  PROT 5.8*  --   --   --  5.6* 5.9*  ALBUMIN 1.7*  < > 1.6*  --  1.3* 1.3*  AST 25  --   --   --  41 79*  ALT 18  --   --   --  28 33  ALKPHOS 68  --   --   --  60 63  BILITOT 0.4  --   --   --  0.4 0.4  GFRNONAA 7*  < > 8* 7* 8* 7*  GFRAA 8*  < > 9* 8* 9* 9*  ANIONGAP 11  < > 12  --  10 11  < > = values in this interval not displayed.   Hematology Recent Labs Lab 01/06/17 0526 01/06/17 1915 01/08/17 0359  WBC 10.2 13.3* 8.0  RBC 3.17* 3.04* 2.80*  HGB 9.9* 9.4* 8.6*  HCT 30.3* 28.8* 26.5*  MCV 95.6 94.7 94.6  MCH 31.2 30.9 30.7  MCHC 32.7 32.6 32.5  RDW 14.9 14.4 14.6  PLT 389 425* 410*    Cardiac Enzymes Recent Labs Lab 01/08/17 1056 01/08/17 1604 01/08/17 2227  TROPONINI 0.27* 0.51* 1.73*   No results for  input(s): TROPIPOC in the last 168 hours.   BNPNo results for input(s): BNP, PROBNP in the last 168 hours.   DDimer No results for input(s): DDIMER in the last 168 hours.   Radiology    No results found.  Cardiac Studies   None   Patient Profile     76 y.o. male CABG 2011 stent to native RCA 07/2016 with NSTEMI. Chronic systolic CHF with EF 35% by echo 10/2016 , AICD, ESRD on peritoneal dialysis post left peroneal revascularization with subsequent transmetatarsal amputation. Non healing stump in need of left BKA. Last 24 hours with SSCP and SEMI ECG changes and elevated troponin  Assessment & Plan    1) CAD/CABG: with recent stent to RCA 07/2016.  Stable on heparin at this time with troponin peak less than 2. Issue is timing of left BKA. If this has to be done in the next 4 weeks due to pain or risk of sepsis does not make sense to cath and intervene. If BKA can be put off for 6-8 weeks at minimum will plan diagnostic cath and possible intervention with BMS  Will try to page Dr Lajoyce Corners  For now continue nitro heparin ASA and beta blocker 2) CRF: on peritoneal dialysis Aranasp for anemia 3) PV:  Wound care and antibiotics  4) CHF:  EF 35% volume control with dialysis   For questions or updates, please contact [facility] Please consult [email or web address] for contact info under Cardiology/STEMI.      Signed, Charlton Haws, MD  01/09/2017, 7:48 AM

## 2017-01-09 NOTE — Consult Note (Signed)
WOC consulted for left foot wound, reviewed records including Dr. Audrie Lia note this am.  Will need BKA, however has had MI and now will require cardiac catheterization.  Dr. Lajoyce Corners will see patient in 4 weeks once medically cleared from cardiology.  Will request bedside nurses to paint this area with betadine to keep dry and stable and to teach patient to do same once at home until he can follow up with Dr. Lajoyce Corners as an outpatient   Re consult if needed, will not follow at this time. Thanks  Jordain Radin [employer], RN,CWOCN, CNS, CWON-AP [phone])

## 2017-01-09 NOTE — Progress Notes (Signed)
Pharmacy Antibiotic Note [name] is a 76 y.o. male with ESRD on CCPD, on day 4 of ceftazidime and daptomycin for empiric coverage of peritonitis vs wound infection. Per ortho will need transtibial amputation once safe from cardiac standpoint.   Plan:  1. Continue Daptomycin  IV q48h with weekly CK 2. Continue Fortaz 500 mg IV q 24 hr 3. Will need to determine antibiotic plan if no imminent surgery    Height:  (175.3 cm) Weight: 206 lb 2.1 oz (93.5 kg) IBW/kg (Calculated) : 70.7  Temp (24hrs), Avg:98.3 F (36.8 C), Min:97.8 F (36.6 C), Max:100.1 F (37.8 C)   Recent Labs Lab 01/04/17 0558 01/06/17 0526 01/06/17 1915 01/07/17 0948 01/08/17 0359 01/09/17 0801  WBC 10.2 10.2 13.3*  --  8.0 11.5*  CREATININE 6.40* 6.27* 6.87* 6.31* 6.47* 6.22*    Estimated Creatinine Clearance: 11.6 mL/min (A) (by C-G formula based on SCr of 6.22 mg/dL (H)).    Allergies  Allergen Reactions  . Lisinopril Hives  . Methotrexate Derivatives Other (See Comments)    blisters  . Sulfa Antibiotics Hives  . Tape Hives  . Vancomycin Other (See Comments)    Blisters   . Zanaflex [Tizanidine Hcl]     Hypotensive stroke    Antibiotics:  Ceftazidime  9/25>> Daptomycin 9/28>>   Culture data:  9/30: MRSA PCR: negative  9/25 BCx: NGx2 9/24 UCx: no growth  Pollyann Samples, PharmD, BCPS 01/09/2017, 10:08 AM

## 2017-01-09 NOTE — Progress Notes (Signed)
ANTICOAGULATION CONSULT NOTE - Follow Up Consult  Pharmacy Consult for Heparin  Indication: chest pain/ACS  Allergies  Allergen Reactions  . Lisinopril Hives  . Methotrexate Derivatives Other (See Comments)    blisters  . Sulfa Antibiotics Hives  . Tape Hives  . Vancomycin Other (See Comments)    Blisters   . Zanaflex [Tizanidine Hcl]     Hypotensive stroke   Patient Measurements: Height:  (175.3 cm) Weight: 206 lb 2.1 oz (93.5 kg) IBW/kg (Calculated) : 70.7  Vital Signs: Temp: 98.1 F (36.7 C) (10/01 0818) Temp Source: Oral (10/01 0818) BP: 137/87 (10/01 0818) Pulse Rate: 91 (10/01 0818)  Labs:  Recent Labs  01/06/17 1915 01/07/17 0948 01/08/17 0359 01/08/17 1056 01/08/17 1604 01/08/17 2227 01/09/17 0801  HGB 9.4*  --  8.6*  --   --   --  9.4*  HCT 28.8*  --  26.5*  --   --   --  28.6*  PLT 425*  --  410*  --   --   --  461*  HEPARINUNFRC  --   --   --   --   --  0.28* 0.50  CREATININE 6.87* 6.31* 6.47*  --   --   --  6.22*  CKTOTAL  --  52  --   --   --   --   --   TROPONINI  --   --   --  0.27* 0.51* 1.73*  --     Estimated Creatinine Clearance: 11.6 mL/min (A) (by C-G formula based on SCr of 6.22 mg/dL (H)).  Assessment: 76 y/o M on heparin for rising troponin. Heparin level is therapeutic. Cath today.   Goal of Therapy:  Heparin level 0.3-0.7 units/ml Monitor platelets by anticoagulation protocol: Yes   Plan:  -Continue heparin infusion at 1300 units/hr -Daily heparin level and CBC -Cath today  Pollyann Samples, PharmD, BCPS 01/09/2017, 10:14 AM

## 2017-01-09 NOTE — Progress Notes (Signed)
[facility] Rounding Note Subjective:    Had CP/jaw pain, diaphoresis and positive trop (initial  0.51 9/30, up to 1.7) No CP, still mild jaw pain ECG ST depressions Heparin per pharmacy Moved to SDU For heart cath today No labs yet today  No PD issues that I have been made aware of  BP looking better  Will need amputation but now cards issues in the way  Objective Vital signs in last 24 hours: Vitals:   01/08/17 1955 01/08/17 1957 01/08/17 2324 01/09/17 0309  BP: (!) 151/60 (!) 151/60 (!) 126/52 (!) 164/62  Pulse: 87 80    Resp:  (!) 22    Temp: 97.8 F (36.6 C)  97.9 F (36.6 C) 98.7 F (37.1 C)  TempSrc: Oral  Axillary Axillary  SpO2:      Weight:      Height:       Weight change: 3.4 kg (7 lb 7.9 oz)  Intake/Output Summary (Last 24 hours) at 01/09/17 1610 Last data filed at 01/09/17 0400  Gross per 24 hour  Intake         12921.35 ml  Output            12155 ml  Net           766.35 ml    Physical Exam: General: WNWD male, lying in bed  Tired, frustrated Plethoric in appearance VS as noted Regular S1S2 No S3 Lungs clear Abdomen +BS, soft and not tender. PD catheter left abd with dry dressings No significant  RLE edema L TMA with black eschar, surrounding cellulitus Dialysis Access: PD cath, left upper arm AVF   NO LABS YET TODAY  Recent Labs Lab 01/06/17 0526 01/06/17 1915 01/07/17 0948 01/08/17 0359  NA 133*  --  132* 131*  K 3.6  --  3.2* 3.3*  CL 96*  --  93* 92*  CO2 25  --  29 28  GLUCOSE 111*  --  218* 344*  BUN 53*  --  53* 53*  CREATININE 6.27* 6.87* 6.31* 6.47*  CALCIUM 8.4*  --  8.2* 8.1*  PHOS 3.6  --  3.8 3.6    Recent Labs Lab 01/02/17 0756  01/06/17 0526 01/07/17 0948 01/08/17 0359  AST 25  --   --  41 79*  ALT 18  --   --  28 33  ALKPHOS 68  --   --  60 63  BILITOT 0.4  --   --  0.4 0.4  PROT 5.8*  --   --  5.6* 5.9*  ALBUMIN 1.7*  < > 1.6* 1.3* 1.3*  < > = values in this interval not displayed. No results for  input(s): LIPASE, AMYLASE in the last 168 hours. No results for input(s): AMMONIA in the last 168 hours.   Recent Labs Lab 01/02/17 0756 01/04/17 0558 01/06/17 0526 01/06/17 1915 01/08/17 0359  WBC 11.5* 10.2 10.2 13.3* 8.0  NEUTROABS 8.7*  --   --   --  6.1  HGB 9.5* 9.4* 9.9* 9.4* 8.6*  HCT 29.5* 29.0* 30.3* 28.8* 26.5*  MCV 96.1 95.4 95.6 94.7 94.6  PLT 428* 477* 389 425* 410*    Recent Labs Lab 01/07/17 0948 01/08/17 1056 01/08/17 1604 01/08/17 2227  CKTOTAL 52  --   --   --   TROPONINI  --  0.27* 0.51* 1.73*     Recent Labs Lab 01/06/17 0922 01/06/17 1123 01/06/17 1655 01/08/17 1704 01/08/17 2101  GLUCAP 92 138* 114* 142*  149*    Medications: Infusions: . sodium chloride 250 mL (01/08/17 2000)  . cefTAZidime (FORTAZ)  IV Stopped (01/08/17 2146)  . DAPTOmycin (CUBICIN)  IV Stopped (01/08/17 2116)  . dialysis solution 1.5% low-MG/low-CA    . dialysis solution 2.5% low-MG/low-CA    . heparin 1,300 Units/hr (01/08/17 2334)  . nitroGLYCERIN 5 mcg/min (01/08/17 1612)    Scheduled Medications: . aspirin EC  81 mg Oral Daily  . calcium acetate  667 mg Oral TID WC  . carvedilol  12.5 mg Oral QHS  . celecoxib  100 mg Oral Daily  . clopidogrel  75 mg Oral Daily  . [START ON 01/12/2017] darbepoetin (ARANESP) injection - DIALYSIS  100 mcg Subcutaneous Q Thu-HD  . diclofenac sodium  2 g Topical QID  . docusate sodium  100 mg Oral BID  . feeding supplement (NEPRO CARB STEADY)  237 mL Oral BID BM  . gentamicin cream  1 application Topical Daily  . gentamicin cream   Topical TID  . insulin aspart  0-9 Units Subcutaneous TID WC  . insulin NPH Human  40 Units Subcutaneous QAC breakfast  . insulin NPH Human  45 Units Subcutaneous QHS  . midodrine  5 mg Oral BID WC  . multivitamin  1 tablet Oral QHS  . senna  1 tablet Oral BID  . sodium chloride flush  3 mL Intravenous Q12H  . Vitamin D (Ergocalciferol)  50,000 Units Oral Q30 days    Dialysis Orders:  Center:  [place] [facility]. 6 exchanges/day one mid-day exchange (occurs around 9am).  2.5 liters of 2.5% per CCPD 2 liters of 2.5% for last fill and mid day drain. Dr. Denny Peon  1. PAD- s/p open repair of R common femoral artery pseudoaneurysm (VVS) and L transmet  12/23/16 (Dr. Lajoyce Corners).  On Fortaz and dapto - probably will now need BKA but cardiac issues presently preclude and if stent required will sig delay BKA 2. Chest pain/+ troponins. Prior CABG. Had stent to RCA 07/2016 d/t NSTEMI. Has ICD in place. Recurrent CP/+ trops now prompting heart cath for today.   3. ESRD- CCPD, using 2.5% dextrose; To facilitate CIR was doing CCPD 2.5L x 5 exchanges, , Dry Day.  At DC can resume prev PD Rx. Had dec fluids to 1/2 1.5% and 1/2 2.5 % with good results, increased back to 2.5% with higher BP's and BP looking better tpday. Same Rx for tonight.  4. Orthostatic hypotension - midodrine 5. Anemia (ABLA)- TSAT 45% and Ferritin 2284, ^Aranesp 200 qThurs. No labs yet today. Last Hb 8.6.  6. Constipation: miralax as needed 7. Hypokalemia: Monitor for need for replacement 8. Myoclonus / Confusion: stopped gabapentin and robaxin. Not confused today that I can tell.  9. Secondary HPT - phoslo. Regular diet to promote nutrition 10. DM - per primary team 10. Low grade temp- blood cultures neg to date and fortaz/dapto-  fluid cell count fine - foot thought to be source- likely will need BKA.   Camille Bal, MD [facility] [phone] Pager 01/09/2017, 8:48 AM

## 2017-01-09 NOTE — Progress Notes (Signed)
Candy Sledge, NP with cardiology notified re: clarification on IV heparin.  Stated "Ok to not restart heparin post-cath."

## 2017-01-09 NOTE — H&P (View-Only) (Signed)
Spoke with Dr Lajoyce Corners He indicated surgery could wait 6 weeks Will arrange diagnostic cath today He has a fistula In LUE and hematoma from recent angio in Right femoral Best access may be left femoral has good pulse If needed plan would be BMS and postpone amputation for At least 6 weeks. He had peritoneal dialysis last night   Charlton Haws

## 2017-01-09 NOTE — Progress Notes (Signed)
PROGRESS NOTE    [name]  ZOX:[id number] DOB: 07/08/1940 DOA: 01/06/2017 PCP: Dorice [name], MD   Brief Narrative:  [name] is a 76 y.o. male with hx of HTN, CVA, ESRD on PD, DM on insulin, COPD and CHFw AICD, hx CAD/ CABG 2011.  Pt was admitted on 12/15/16 for non-healing L foot wound and R groin pseudoaneurysm, underwent repair of PA R groin and also resection of left 3rd metatarsal head by Dr Randie Heinz.  However, the left foot progressed and developed gangrenous changes, so on 9/14 pt went for L transmetatarsal amputation by Dr Lajoyce Corners.  Post op abx were stopped and pt admitted to rehab on 12/27/16.      On 9/23 pt had fevers and persistent ^WBC and tachycardia.  Started on [place] due to concerns of possible infection.  UcX and Blood cx's were negative.  CXR was negative.  Pt had increasing L foot pain w/ some skin changes/ blistering and serous drainage from L TMA wound. Dr Lajoyce Corners was consulted and per the chart, possible plan is for surgical intervention next week. Discussed with Dr. Lajoyce Corners and will see the patient Monday. C/w Abx for now.   Patient developed Chest Pain on 9/30 so he was given full dose ASA, SL NTG, and a Stat EKG and Troponin was ordered. I reviewed the EKG and showed significant ST Depressions (agreed by Cardiologist Dr. Donnie Aho) so Cardiology was consulted and patient was placed on a NTG gtt and Heparin gtt and transferred to SDU. Plan is for diagnostic Cardiac Catheterization and possible intervention with Bare Metal Stent today as Transtibial Amputation can wait 6 weeks and be treated symptomatically.   Assessment & Plan:   Principal Problem:   Left foot infection Active Problems:   ESRD on peritoneal dialysis (HCC)   CAD (coronary artery disease) of artery bypass graft   Chronic obstructive pulmonary disease (HCC)   Chronic congestive heart failure (HCC)   Chronic hypotension   ICD (implantable cardioverter-defibrillator) in place   IDDM (insulin  dependent diabetes mellitus) (HCC)   Foot infection   Hypokalemia   Hypomagnesemia   Elevated AST (SGOT)  Acute Anginal Chest Pain with Concern for an NSTEMI -Ordered Full dose ASA 325 mg po  -C/w Plavix 75 mg po Daily  -Given NTG and ordered Stat Troponin and EKG -Cardiology Consulted and evaluated and patient being transferred to SDU and being placed on Heparin gtt and Nitroglycerin gtt -Cardiac Troponin went from 0.27 -> 0.51 -> 1.73 -Plan by Cardiology is Cardiac Catheterization and possible intervention with Bare Metal Stent today as Transtibial Amputation can wait 6 weeks and be treated symptomatically.  -Cath to be done through Left Femoral  L foot infection  -S/p recent L TMA, will likely need BKA but can be postponed 4-6 weeks per Dr. Lajoyce Corners.   -Admitted to the Hospital -WBC went from 10.2 -> 13.3 -> 8.0 -> 11.5 -Will continue IV fortaz, add Cubicin (vanc allergic).  -WOC Nurse Consult; Recommends to paint this area with betadine to keep dry and stable and to teach patient to do same once at home until he can follow up with Dr. Lajoyce Corners as an outpatient -Will need to Evaluate for Osteomyelitis after Cardiac Catheterization -Appreciate Dr. Audrie Lia evaluation   ESRD -C/w Calcium Acetate 667 mg po TIDwm -Cont CCPD per Nephro -Nephrology Following; Per Dr. Eliott Nine can resume CCPD 2.5L x 5 Exchanges 90 min a day   Diabetes Mellitus Type 2 -C/w Diabetic Diet -C/w Insulin NPH  40 units sq Daily qBreakfast and Insulin NPH 45 units sq qHS -C/w Sensitive Novolog SSI AC -CBG's ranging from 93-149  Hx of CVA  -Continue ASA 81 mg and Clopidogrel 75 mg po q Daily  -Atorvastatin 80 mg po qHS held because of interaction with Dapatomycin  PVD -Continue ASA 81 mg and Clopidogrel 75 mg po q Daily  -Atorvastatin 80 mg po qHS held because of interaction with Dapatomycin -Patient is s/p Left Peroneal Artery Revascularization and recent Left Transmetatarsal Amputation on 9/14 -Per Dr. Lajoyce Corners  Transtibial Amputation can be postponed 6 weeks as patient is going for Cardiac Catheterization and possible intervention with Bare Metal Stent   Anemia of CKD  -Cont Darbepoetin Alfa 100 mcg qThursday  -C/w Vitamin D 50,000 units po q30 days -Hb/Hct went from 9.9/30.3 -> 9.4/28.8 -> 8.6/26.5 -> 9.4/28.6 -Continue to Monitor for S/Sx of Bleeding as Patient is now on a Heparin gtt -Repeat CBC in AM  Hypotension -Continue Midodrine 5 mg po BID  CAD/Hx CABG / ICD  -C/w NTG gtt;  Isosorbide Mononitrate 30 mg po Daily stopped by Cardiology  -C/w Carvedilol 12.5 mg po BID, ASA 81 mg, Clopidogrel 75 mg po Daily -Held Atorvastatin because of interaction with Daptomycin -Patient is on an NTG gtt and Heparin gtt   Hypokalemia/Hypomagnesemia  -Patient's K+ was 3.4 and Mag was 1.6 -Replete with 1 gram of IV Mag Sulfate and 20 mEQ of IV KCl -Continue to Monitor and Replete as Necessary -Repeat CMP and Mag Level in AM  COPD -Currently not in Exacerbation -Continue to Monitor  Chronic Systolic CHF -Currently not exacerbation -Had recent ICD placement and Recent ECHO in July which showed EF of 35% -Strict I's and O's; Daily Weights; Patient is +530.3 mL, Weight is up 6 lbs.  -Patient gets Daily Peritoneal Dialysis  -Cardiology now following and appreciate their Recc's   Elevated AST -Likely in the setting of NSTEMI -AST went from 79 -> 269 -Continue to Monitor and Trend -Repeat CMP in AM   DVT prophylaxis: Anticoagulated with Heparin gtt Code Status: FULL CODE Family Communication: Discussed with wife at bedside Disposition Plan: Remain Inpatient as patient going for Cardiac Catheterization   Consultants:   Nephrology Dr. Eliott Nine  Orthopedic Surgery Dr. Lajoyce Corners  Cardiology Dr. Eden Emms  WOC Nurse   Procedures: Peritoneal Dialysis   Antimicrobials:  Anti-infectives    Start     Dose/Rate Route Frequency Ordered Stop   01/06/17 2200  cefTAZidime (FORTAZ) 500 mg in dextrose 5 %  50 mL IVPB     500 mg 100 mL/hr over 30 Minutes Intravenous Every 24 hours 01/06/17 1925     01/06/17 2030  DAPTOmycin (CUBICIN) 500 mg in sodium chloride 0.9 % IVPB     500 mg 220 mL/hr over 30 Minutes Intravenous Every 48 hours 01/06/17 1923       Subjective: Seen and examined and stated he had no chest pain but jaw pain was still there. Denied any SOB. No lightheadedness. Patient to go for a Cardiac Catheterization.    Objective: Vitals:   01/08/17 2324 01/09/17 0309 01/09/17 0818 01/09/17 1116  BP: (!) 126/52 (!) 164/62 137/87 (!) 101/52  Pulse:   91 72  Resp:   (!) 24 19  Temp: 97.9 F (36.6 C) 98.7 F (37.1 C) 98.1 F (36.7 C) 97.8 F (36.6 C)  TempSrc: Axillary Axillary Oral Oral  SpO2:   100% 98%  Weight:      Height:  Intake/Output Summary (Last 24 hours) at 01/09/17 1206 Last data filed at 01/09/17 1000  Gross per 24 hour  Intake         13006.35 ml  Output            13306 ml  Net          -299.65 ml   Filed Weights   01/07/17 0827 01/07/17 1835 01/08/17 1624  Weight: 90.1 kg (198 lb 10.2 oz) 91.1 kg (200 lb 13.4 oz) 93.5 kg (206 lb 2.1 oz)   Examination: Physical Exam:  Constitutional: WN/WD Caucasian male in no apparent distress but complaining of jaw pain again.  Eyes: Sclerae anicteric. Lids Normal ENMT: External Ears and nose appear normal but has a small ulcer on nose; MMM Neck: Supple with no JVD; Respiratory: CTAB; No appreciable wheezing/rales/rhonchi. Patient not tachypenic Cardiovascular: RRR; No extremity edema Abdomen: Soft, NT, ND. Bowel sounds present; PD Catheter in place GU: Deferred Musculoskeletal: Left Metatarsal Amputation; Has Left Upper Arm Fistula Skin: Left foot dry gangrenous region; has small ulcer on Right foot. Warm and dry. No rashes or lesions Neurologic: CN 2-12 grossly intact. No appreciable focal deficits Psychiatric: Pleasant mood but flat affect. Intact judgement and insight. Awake and alert  Data Reviewed: I  have personally reviewed following labs and imaging studies  CBC:  Recent Labs Lab 01/04/17 0558 01/06/17 0526 01/06/17 1915 01/08/17 0359 01/09/17 0801  WBC 10.2 10.2 13.3* 8.0 11.5*  NEUTROABS  --   --   --  6.1 9.3*  HGB 9.4* 9.9* 9.4* 8.6* 9.4*  HCT 29.0* 30.3* 28.8* 26.5* 28.6*  MCV 95.4 95.6 94.7 94.6 93.5  PLT 477* 389 425* 410* 461*   Basic Metabolic Panel:  Recent Labs Lab 01/04/17 0558 01/06/17 0526 01/06/17 1915 01/07/17 0948 01/08/17 0359 01/09/17 0801  NA 130* 133*  --  132* 131* 132*  K 4.5 3.6  --  3.2* 3.3* 3.4*  CL 91* 96*  --  93* 92* 95*  CO2 28 25  --  GLUCOSE 400* 111*  --  218* 344* 167*  BUN 45* 53*  --  53* 53* 46*  CREATININE 6.40* 6.27* 6.87* 6.31* 6.47* 6.22*  CALCIUM 8.4* 8.4*  --  8.2* 8.1* 8.1*  MG  --   --   --  1.6* 1.5* 1.6*  PHOS 4.3 3.6  --  3.8 3.6 3.1   GFR: Estimated Creatinine Clearance: 11.6 mL/min (A) (by C-G formula based on SCr of 6.22 mg/dL (H)). Liver Function Tests:  Recent Labs Lab 01/04/17 0558 01/06/17 0526 01/07/17 0948 01/08/17 0359 01/09/17 0801  AST  --   --  41 79* 269*  ALT  --   --  28 33 53  ALKPHOS  --   --  60 63 67  BILITOT  --   --  0.4 0.4 0.2*  PROT  --   --  5.6* 5.9* 5.9*  ALBUMIN 1.6* 1.6* 1.3* 1.3* 1.4*   No results for input(s): LIPASE, AMYLASE in the last 168 hours. No results for input(s): AMMONIA in the last 168 hours. Coagulation Profile: No results for input(s): INR, PROTIME in the last 168 hours. Cardiac Enzymes:  Recent Labs Lab 01/07/17 0948 01/08/17 1056 01/08/17 1604 01/08/17 2227  CKTOTAL 52  --   --   --   TROPONINI  --  0.27* 0.51* 1.73*   BNP (last 3 results) No results for input(s): PROBNP in the last 8760 hours. HbA1C: No results  for input(s): HGBA1C in the last 72 hours. CBG:  Recent Labs Lab 01/08/17 1143 01/08/17 1704 01/08/17 2101 01/09/17 0817 01/09/17 1113  GLUCAP 287* 142* 149* 147* 93   Lipid Profile: No results for input(s):  CHOL, HDL, LDLCALC, TRIG, CHOLHDL, LDLDIRECT in the last 72 hours. Thyroid Function Tests: No results for input(s): TSH, T4TOTAL, FREET4, T3FREE, THYROIDAB in the last 72 hours. Anemia Panel: No results for input(s): VITAMINB12, FOLATE, FERRITIN, TIBC, IRON, RETICCTPCT in the last 72 hours. Sepsis Labs: No results for input(s): PROCALCITON, LATICACIDVEN in the last 168 hours.  Recent Results (from the past 240 hour(s))  Culture, Urine     Status: None   Collection Time: 01/02/17 12:14 PM  Result Value Ref Range Status   Specimen Description URINE, CATHETERIZED  Final   Special Requests NONE  Final   Culture NO GROWTH  Final   Report Status 01/03/2017 FINAL  Final  Culture, blood (routine x 2)     Status: None   Collection Time: 01/03/17 10:41 PM  Result Value Ref Range Status   Specimen Description BLOOD RIGHT HAND  Final   Special Requests   Final    BOTTLES DRAWN AEROBIC AND ANAEROBIC Blood Culture adequate volume   Culture NO GROWTH 5 DAYS  Final   Report Status 01/09/2017 FINAL  Final  Culture, blood (routine x 2)     Status: None   Collection Time: 01/03/17 10:41 PM  Result Value Ref Range Status   Specimen Description BLOOD RIGHT ANTECUBITAL  Final   Special Requests   Final    BOTTLES DRAWN AEROBIC AND ANAEROBIC Blood Culture adequate volume   Culture NO GROWTH 5 DAYS  Final   Report Status 01/09/2017 FINAL  Final  MRSA PCR Screening     Status: None   Collection Time: 01/08/17  4:15 PM  Result Value Ref Range Status   MRSA by PCR NEGATIVE NEGATIVE Final    Comment:        The GeneXpert MRSA Assay (FDA approved for NASAL specimens only), is one component of a comprehensive MRSA colonization surveillance program. It is not intended to diagnose MRSA infection nor to guide or monitor treatment for MRSA infections.     Radiology Studies: No results found.   Scheduled Meds: . aspirin  81 mg Oral Pre-Cath  . [START ON 01/10/2017] aspirin EC  81 mg Oral Daily  .  calcium acetate  667 mg Oral TID WC  . carvedilol  12.5 mg Oral QHS  . celecoxib  100 mg Oral Daily  . clopidogrel  75 mg Oral Daily  . [START ON 01/12/2017] darbepoetin (ARANESP) injection - DIALYSIS  200 mcg Subcutaneous Q Thu-HD  . diclofenac sodium  2 g Topical QID  . docusate sodium  100 mg Oral BID  . feeding supplement (NEPRO CARB STEADY)  237 mL Oral BID BM  . gentamicin cream  1 application Topical Daily  . gentamicin cream   Topical TID  . insulin aspart  0-9 Units Subcutaneous TID WC  . insulin NPH Human  40 Units Subcutaneous QAC breakfast  . insulin NPH Human  45 Units Subcutaneous QHS  . midodrine  5 mg Oral BID WC  . multivitamin  1 tablet Oral QHS  . senna  1 tablet Oral BID  . sodium chloride flush  3 mL Intravenous Q12H  . sodium chloride flush  3 mL Intravenous Q12H  . Vitamin D (Ergocalciferol)  50,000 Units Oral Q30 days   Continuous Infusions: .  sodium chloride 250 mL (01/08/17 2000)  . sodium chloride    . sodium chloride 10 mL/hr at 01/09/17 0900  . cefTAZidime (FORTAZ)  IV Stopped (01/08/17 2146)  . DAPTOmycin (CUBICIN)  IV Stopped (01/08/17 2116)  . dialysis solution 1.5% low-MG/low-CA    . dialysis solution 2.5% low-MG/low-CA    . heparin 1,300 Units/hr (01/09/17 0848)  . magnesium sulfate 1 - 4 g bolus IVPB    . nitroGLYCERIN 5 mcg/min (01/08/17 1612)  . potassium chloride      LOS: 3 days   Merlene Laughter, DO [facility] Pager [phone]  If 7PM-7AM, please contact night-coverage [email or web address] Password [email or web address] 01/09/2017, 12:06 PM

## 2017-01-10 ENCOUNTER — Encounter (HOSPITAL_COMMUNITY): Payer: Self-pay

## 2017-01-10 ENCOUNTER — Other Ambulatory Visit (INDEPENDENT_AMBULATORY_CARE_PROVIDER_SITE_OTHER): Payer: Self-pay | Admitting: Orthopedic Surgery

## 2017-01-10 DIAGNOSIS — I200 Unstable angina: Secondary | ICD-10-CM

## 2017-01-10 LAB — GLUCOSE, CAPILLARY
GLUCOSE-CAPILLARY: 108 mg/dL — AB (ref 65–99)
GLUCOSE-CAPILLARY: 192 mg/dL — AB (ref 65–99)
Glucose-Capillary: 239 mg/dL — ABNORMAL HIGH (ref 65–99)
Glucose-Capillary: 188 mg/dL — ABNORMAL HIGH (ref 65–99)

## 2017-01-10 LAB — CBC WITH DIFFERENTIAL/PLATELET
Basophils Absolute: 0 K/uL (ref 0.0–0.1)
Basophils Relative: 0 %
Eosinophils Absolute: 0.1 K/uL (ref 0.0–0.7)
Eosinophils Relative: 1 %
HCT: 29.7 % — ABNORMAL LOW (ref 39.0–52.0)
Hemoglobin: 9.5 g/dL — ABNORMAL LOW (ref 13.0–17.0)
Lymphocytes Relative: 10 %
Lymphs Abs: 1.1 K/uL (ref 0.7–4.0)
MCH: 30.1 pg (ref 26.0–34.0)
MCHC: 32 g/dL (ref 30.0–36.0)
MCV: 94 fL (ref 78.0–100.0)
Monocytes Absolute: 1.2 K/uL — ABNORMAL HIGH (ref 0.1–1.0)
Monocytes Relative: 12 %
Neutro Abs: 7.7 K/uL (ref 1.7–7.7)
Neutrophils Relative %: 77 %
Platelets: 471 K/uL — ABNORMAL HIGH (ref 150–400)
RBC: 3.16 MIL/uL — ABNORMAL LOW (ref 4.22–5.81)
RDW: 15.1 % (ref 11.5–15.5)
WBC: 10.2 K/uL (ref 4.0–10.5)

## 2017-01-10 LAB — RENAL FUNCTION PANEL
ALBUMIN: 1.4 g/dL — AB (ref 3.5–5.0)
Anion gap: 11 (ref 5–15)
BUN: 50 mg/dL — AB (ref 6–20)
CO2: 24 mmol/L (ref 22–32)
CREATININE: 6.36 mg/dL — AB (ref 0.61–1.24)
Calcium: 8 mg/dL — ABNORMAL LOW (ref 8.9–10.3)
Chloride: 95 mmol/L — ABNORMAL LOW (ref 101–111)
GFR calc Af Amer: 9 mL/min — ABNORMAL LOW (ref >60)
GFR, EST NON AFRICAN AMERICAN: 8 mL/min — AB (ref >60)
Glucose, Bld: 188 mg/dL — ABNORMAL HIGH (ref 65–99)
PHOSPHORUS: 4 mg/dL (ref 2.5–4.6)
Potassium: 3.4 mmol/L — ABNORMAL LOW (ref 3.5–5.1)
SODIUM: 130 mmol/L — AB (ref 135–145)

## 2017-01-10 LAB — PHOSPHORUS: Phosphorus: 4 mg/dL (ref 2.5–4.6)

## 2017-01-10 LAB — MAGNESIUM: Magnesium: 1.8 mg/dL (ref 1.7–2.4)

## 2017-01-10 MED ORDER — CEFAZOLIN SODIUM-DEXTROSE 2-4 GM/100ML-% IV SOLN
2.0000 g | INTRAVENOUS | Status: AC
  Administered 2017-01-11: 2 g via INTRAVENOUS
  Filled 2017-01-10: qty 100

## 2017-01-10 MED ORDER — HEPARIN SODIUM (PORCINE) 5000 UNIT/ML IJ SOLN
5000.0000 [IU] | Freq: Three times a day (TID) | INTRAMUSCULAR | Status: DC
  Administered 2017-01-10 – 2017-01-17 (×19): 5000 [IU] via SUBCUTANEOUS
  Filled 2017-01-10 (×18): qty 1

## 2017-01-10 MED ORDER — POVIDONE-IODINE 10 % EX SWAB
2.0000 "application " | Freq: Once | CUTANEOUS | Status: AC
  Administered 2017-01-11: 2 via TOPICAL

## 2017-01-10 MED ORDER — POTASSIUM CHLORIDE CRYS ER 20 MEQ PO TBCR: 40.0000 meq | EXTENDED_RELEASE_TABLET | Freq: Once | ORAL | Status: DC

## 2017-01-10 MED ORDER — DARBEPOETIN ALFA 200 MCG/0.4ML IJ SOSY
200.0000 ug | PREFILLED_SYRINGE | INTRAMUSCULAR | Status: DC
  Administered 2017-01-10: 200 ug via SUBCUTANEOUS
  Filled 2017-01-10 (×2): qty 0.4

## 2017-01-10 MED ORDER — HEPARIN 1000 UNIT/ML FOR PERITONEAL DIALYSIS
INTRAMUSCULAR | Status: DC | PRN
  Filled 2017-01-10: qty 5000

## 2017-01-10 MED ORDER — CHLORHEXIDINE GLUCONATE 4 % EX LIQD
60.0000 mL | Freq: Once | CUTANEOUS | Status: AC
  Administered 2017-01-11: 4 via TOPICAL

## 2017-01-10 MED ORDER — DELFLEX-LC/2.5% DEXTROSE 394 MOSM/L IP SOLN: INTRAPERITONEAL | Status: DC

## 2017-01-10 MED ORDER — HEPARIN 1000 UNIT/ML FOR PERITONEAL DIALYSIS: 500.0000 [IU] | INTRAMUSCULAR | Status: DC | PRN

## 2017-01-10 MED ORDER — POTASSIUM CHLORIDE CRYS ER 20 MEQ PO TBCR
30.0000 meq | EXTENDED_RELEASE_TABLET | Freq: Once | ORAL | Status: AC
  Administered 2017-01-10: 30 meq via ORAL

## 2017-01-10 MED FILL — Lidocaine HCl Local Inj 2%: INTRAMUSCULAR | Qty: 10 | Status: AC

## 2017-01-10 NOTE — Progress Notes (Signed)
[facility] Rounding Note Subjective:    Had cath yesterday - patent RCA stent. No critical stenoses Cards cleared for amputation No PD issues that I have been made aware of  Objective Vital signs in last 24 hours: Vitals:   01/10/17 0200 01/10/17 0400 01/10/17 0835 01/10/17 0840  BP: 120/68 134/67  105/85  Pulse: 88 86  89  Resp: 20 19  (!) 26  Temp:  98.7 F (37.1 C)  (!) 97.2 F (36.2 C)  TempSrc:  Axillary  Oral  SpO2: 96% 97%    Weight:   92 kg (202 lb 13.2 oz)   Height:       Weight change: -1.2 kg (-2 lb 10.3 oz)  Intake/Output Summary (Last 24 hours) at 01/10/17 1011 Last data filed at 01/10/17 0835  Gross per 24 hour  Intake            12495 ml  Output            13267 ml  Net             -772 ml    Physical Exam: WNWD male, lying in bed "just tired" Plethoric in appearance Excoriations bridge of nose VS as noted Regular S1S2 No S3 Lungs diminished at bases Abdomen +BS, soft and not tender. PD catheter left abd with dry dressings Trace -1+ RLE edema L TMA wrapped and some LLE edema Dialysis Access: PD cath, left upper arm AVF   Recent Labs Lab 01/08/17 0359 01/09/17 0801 01/10/17 0123 01/10/17 0818  NA 131* 132* 130*  --   K 3.3* 3.4* 3.4*  --   CL 92* 95* 95*  --   CO2 --   GLUCOSE 344* 167* 188*  --   BUN 53* 46* 50*  --   CREATININE 6.47* 6.22* 6.36*  --   CALCIUM 8.1* 8.1* 8.0*  --   PHOS 3.6 3.1 4.0 4.0    Recent Labs Lab 01/07/17 0948 01/08/17 0359 01/09/17 0801 01/10/17 0123  AST 41 79* 269*  --   ALT 28 33 53  --   ALKPHOS 60 63 67  --   BILITOT 0.4 0.4 0.2*  --   PROT 5.6* 5.9* 5.9*  --   ALBUMIN 1.3* 1.3* 1.4* 1.4*     Recent Labs Lab 01/06/17 0526 01/06/17 1915 01/08/17 0359 01/09/17 0801 01/10/17 0818  WBC 10.2 13.3* 8.0 11.5* 10.2  NEUTROABS  --   --  6.1 9.3* 7.7  HGB 9.9* 9.4* 8.6* 9.4* 9.5*  HCT 30.3* 28.8* 26.5* 28.6* 29.7*  MCV 95.6 94.7 94.6 93.5 94.0  PLT 389 425* 410* 461* 471*    Recent  Labs Lab 01/07/17 0948 01/08/17 1056 01/08/17 1604 01/08/17 2227  CKTOTAL 52  --   --   --   TROPONINI  --  0.27* 0.51* 1.73*     Recent Labs Lab 01/09/17 1309 01/09/17 1340 01/09/17 1615 01/09/17 2104 01/10/17 0808  GLUCAP 54* 144* 65 117* 239*    Medications: Infusions: . sodium chloride 250 mL (01/08/17 2000)  . sodium chloride    . cefTAZidime (FORTAZ)  IV Stopped (01/09/17 2314)  . DAPTOmycin (CUBICIN)  IV Stopped (01/08/17 2116)  . dialysis solution 1.5% low-MG/low-CA    . dialysis solution 2.5% low-MG/low-CA    . nitroGLYCERIN 5 mcg/min (01/08/17 1612)    Scheduled Medications: . aspirin  81 mg Oral Daily  . calcium acetate  667 mg Oral TID WC  . carvedilol  12.5  mg Oral QHS  . celecoxib  100 mg Oral Daily  . clopidogrel  75 mg Oral Q breakfast  . [START ON 01/12/2017] darbepoetin (ARANESP) injection - DIALYSIS  200 mcg Subcutaneous Q Thu-HD  . diclofenac sodium  2 g Topical QID  . docusate sodium  100 mg Oral BID  . feeding supplement (NEPRO CARB STEADY)  237 mL Oral BID BM  . gentamicin cream  1 application Topical Daily  . gentamicin cream   Topical TID  . insulin aspart  0-9 Units Subcutaneous TID WC  . insulin NPH Human  40 Units Subcutaneous QAC breakfast  . insulin NPH Human  45 Units Subcutaneous QHS  . midodrine  5 mg Oral BID WC  . multivitamin  1 tablet Oral QHS  . senna  1 tablet Oral BID  . sodium chloride flush  3 mL Intravenous Q12H  . sodium chloride flush  3 mL Intravenous Q12H  . Vitamin D (Ergocalciferol)  50,000 Units Oral Q30 days    Dialysis Orders:  Center[facility] [place] [facility]. 6 exchanges/day one mid-day exchange (occurs around 9am).  2.5 liters of 2.5% per CCPD 2 liters of 2.5% for last fill and mid day drain. Dr. Denny Peon  1. PAD- s/p open repair of R common femoral artery pseudoaneurysm (VVS) and L transmet  12/23/16 (Dr. Lajoyce Corners).  On Fortaz and dapto - will now need BKA and cardiology has cleared 2. Chest pain/+  troponins. Prior CABG. Had stent to RCA 07/2016 d/t NSTEMI. Has ICD in place. Recurrent CP/+ trops. Cath 10/1 no intervention required.  3. ESRD- CCPD, using 2.5% dextrose at home; Altered regimen for ease in hospital (no pause or day bag) Had been using 1/2 and 1/2 past few days  but extra volume on board on exam today -  all 2.5% bags tonight. Reassess tomorrow. Wife agrees.  4. Orthostatic hypotension - midodrine 5. Anemia (ABLA)- TSAT 45% and Ferritin 2284, ^Aranesp 200 qThurs. Not given yet this admission that I can see (ordered "with HD" which may be the issue). Reordered SQ for today. 6. Constipation: miralax as needed 7. Hypokalemia: Monitor for need for replacement. Give 30 po today.  8. Myoclonus / Confusion: stopped gabapentin and robaxin. Not confused past 2 days that I can see 9. Secondary HPT - phoslo. Regular diet to promote nutrition 10. DM - per primary team 10. Low grade temp- blood cultures neg to date and fortaz/dapto-  fluid cell count fine - foot thought to be source- will need BKA.   Camille Bal, MD [facility] [phone] Pager 01/10/2017, 10:11 AM

## 2017-01-10 NOTE — Progress Notes (Signed)
Patient ID: [name], male   DOB: 05/07/1940, 75 y.o.   MRN: [id number] Patient is cleared for surgery for left transtibial amputation due to ischemic gangrenous changes for the transmetatarsal amputation.  Will plan for surgery tomorrow Wednesday. 

## 2017-01-10 NOTE — Progress Notes (Signed)
PROGRESS NOTE    [name]  ZOX:[id number] DOB: 1940/04/24 DOA: 01/06/2017 PCP: Dorice [name], MD   Brief Narrative:  [name] is a 76 y.o. male with hx of HTN, CVA, ESRD on PD, DM on insulin, COPD and CHFw AICD, hx CAD/ CABG 2011.  Pt was admitted on 12/15/16 for non-healing L foot wound and R groin pseudoaneurysm, underwent repair of PA R groin and also resection of left 3rd metatarsal head by Dr Randie Heinz.  However, the left foot progressed and developed gangrenous changes, so on 9/14 pt went for L transmetatarsal amputation by Dr Lajoyce Corners.  Post op abx were stopped and pt admitted to rehab on 12/27/16.      On 9/23 pt had fevers and persistent ^WBC and tachycardia.  Started on [place] due to concerns of possible infection.  UcX and Blood cx's were negative.  CXR was negative.  Pt had increasing L foot pain w/ some skin changes/ blistering and serous drainage from L TMA wound. Dr Lajoyce Corners was consulted and per the chart, possible plan is for surgical intervention next week. Discussed with Dr. Lajoyce Corners and will see the patient Monday. C/w Abx for now.   Patient developed Chest Pain on 9/30 so he was given full dose ASA, SL NTG, and a Stat EKG and Troponin was ordered. I reviewed the EKG and showed significant ST Depressions (agreed by Cardiologist Dr. Donnie Aho) so Cardiology was consulted and patient was placed on a NTG gtt and Heparin gtt and transferred to SDU. Patient underwent catheterization and required no stenting and there was no critical stenosis identified. Cardiology cleared the patient and plan is for Left BKA in AM.   Assessment & Plan:   Principal Problem:   Left foot infection Active Problems:   ESRD on peritoneal dialysis (HCC)   CAD (coronary artery disease) of artery bypass graft   Chronic obstructive pulmonary disease (HCC)   Chronic congestive heart failure (HCC)   Chronic hypotension   ICD (implantable cardioverter-defibrillator) in place   IDDM (insulin dependent  diabetes mellitus) (HCC)   Foot infection   Hypokalemia   Hypomagnesemia   Elevated AST (SGOT)   Chest pain   NSTEMI (non-ST elevated myocardial infarction) (HCC)  L foot infection with Gangrenous changes -S/p recent L TMA and will need BKA due to ischemic gangrenous changes -Admitted to the Hospital -WBC went from 10.2 -> 13.3 -> 8.0 -> 11.5 -> 10.2 -Continue IV Fortaz, add Cubicin (vanc allergic).  -WOC Nurse Consult; Recommends to paint this area with betadine to keep dry and stable and to teach patient to do same once at home until he can follow up with Dr. Lajoyce Corners as an outpatient -Appreciate Dr. Audrie Lia evaluation and now that the patient did not receive any stenting will proceed with Left Transtibial Amputation in AM; I personally spoke with Dr. Lajoyce Corners and he stated that he would schedule it for Friday but will now likely be Done tomorrow  Acute Anginal Chest Pain with Concern for an NSTEMI, improved -Ordered Full dose ASA 325 mg po  -C/w Plavix 75 mg po Daily  -Given NTG and ordered Stat Troponin and EKG -Cardiology Consulted and evaluated and patient being transferred to SDU and being placed on Heparin gtt and Nitroglycerin gtt -Cardiac Troponin went from 0.27 -> 0.51 -> 1.73 -Plan by Cardiology was Cardiac Catheterization and possible intervention with Bare Metal Stent  -Cath as below showed no Critical Stenosis and no need for Bare Metal Stent -NTG and Heparin gtt  have been D/C'd -Per Cardiology ok to proceed with Transtibial Amputation and per Dr. Lajoyce Corners will plan for surgery Tomorrow 01/11/17  ESRD -C/w Calcium Acetate 667 mg po TIDwm -Cont CCPD per Nephro -Nephrology Following; Per Dr. Eliott Nine can resume CCPD 2.5L x 5 Exchanges 90 min a day   Diabetes Mellitus Type 2 -C/w Diabetic Diet -C/w Insulin NPH 40 units sq Daily qBreakfast and Insulin NPH 45 units sq qHS -C/w Sensitive Novolog SSI AC -CBG's ranging from 117-239  Hx of CVA  -Continue ASA 81 mg and Clopidogrel 75 mg  po q Daily  -Atorvastatin 80 mg po qHS held because of interaction with Dapatomycin  PVD -Continue ASA 81 mg and Clopidogrel 75 mg po q Daily  -Atorvastatin 80 mg po qHS held because of interaction with Dapatomycin -Patient is s/p Left Peroneal Artery Revascularization and recent Left Transmetatarsal Amputation on 9/14 -Patient Cleared for Surgery and will need BKA in AM  Anemia of CKD  -Cont Darbepoetin Alfa 100 mcg qThursday  -C/w Vitamin D 50,000 units po q30 days -Hb/Hct stable at 9.5/29.7 -Continue to Monitor for S/Sx of Bleeding as Patient is now on a Heparin gtt -Repeat CBC in AM  Hypotension -Continue Midodrine 5 mg po BID  CAD/Hx CABG / ICD  -NTG gtt now D/C'd;  Isosorbide Mononitrate 30 mg po Daily stopped by Cardiology  -C/w Carvedilol 12.5 mg po BID, ASA 81 mg, Clopidogrel 75 mg po Daily -Held Atorvastatin because of interaction with Daptomycin -NTG gtt and Heparin gtt now Stopped -Cards Following and appreciate further Evaluation and Recommendations  Hypokalemia/Hypomagnesemia  -Patient's K+ was 3.4 and Mag was 1.8 -Replete with po 30 mEQ of KCl -Continue to Monitor and Replete as Necessary -Repeat CMP and Mag Level in AM  COPD -Currently not in Exacerbation -Continue to Monitor  Chronic Systolic CHF -Currently not exacerbation -Had recent ICD placement and Recent ECHO in July which showed EF of 35% -Strict I's and O's; Daily Weights; Patient is -121.7 mL, Weight is up 6 lbs.  -Patient gets Daily Peritoneal Dialysis  -Cardiology now following and appreciate their Recc's   Elevated AST -Likely in the setting of NSTEMI -AST went from 79 -> 269 -Continue to Monitor and Trend -Repeat CMP in AM   Hyponatremia -Mild at 130 -Continue to Monitor as patient is on PD -Repeat CMP in AM   DVT prophylaxis: Heparin gtt stopped; Started Heparin 5000 units sq q8h Code Status: FULL CODE Family Communication: Discussed with wife at bedside Disposition Plan: Remain  Inpatient as patient going for Cardiac Catheterization   Consultants:   Nephrology Dr. Eliott Nine  Orthopedic Surgery Dr. Lajoyce Corners  Cardiology Dr. Eden Emms  WOC Nurse   Procedures: Peritoneal Dialysis   CARDIAC CATHETERIZATION  Ost RCA to Prox RCA lesion, 0 %stenosed.  LM lesion, 100 %stenosed.  LIMA.  SVG.  Origin lesion, 100 %stenosed.  SVG.  Dist Graft to Insertion lesion, 40 %stenosed.  1st Mrg lesion, 75 %stenosed.  Ost Cx to Prox Cx lesion, 80 %stenosed.  Ost 3rd Mrg lesion, 100 %stenosed.  Ost 1st Mrg lesion, 100 %stenosed.  Mid Cx lesion, 60 %stenosed.   IMPRESSION: [name] has a patent proximal RCA stent, a patent LIMA to the LAD. He has a patent sequential graft to 2 obtuse marginal branches. The first anastomosis has about a 60- 75% stenosis at the anastomosis.. The vein graft crossing that obtuse marginal branch has about 50-60% stenosis. There are no critical stenoses or copper lesions identified. Continue medical therapy will  be recommended. The sheath was removed and pressure held on the left groin to achieve hemostasis. The patient left the lab in stable condition.  Antimicrobials:  Anti-infectives    Start     Dose/Rate Route Frequency Ordered Stop   01/06/17 2200  cefTAZidime (FORTAZ) 500 mg in dextrose 5 % 50 mL IVPB     500 mg 100 mL/hr over 30 Minutes Intravenous Every 24 hours 01/06/17 1925     01/06/17 2030  DAPTOmycin (CUBICIN) 500 mg in sodium chloride 0.9 % IVPB     500 mg 220 mL/hr over 30 Minutes Intravenous Every 48 hours 01/06/17 1923       Subjective: Seen and examined and stated he was not feeling well today and that his body ached. Wound on Left foot appeared to be worsening. No CP but had intermittent jaw pain still but . No lightheadedness or dizziness.   Objective: Vitals:   01/10/17 0400 01/10/17 0835 01/10/17 0840 01/10/17 1203  BP: 134/67  105/85 126/66  Pulse: 86  89 87  Resp: 19  (!) 26   Temp: 98.7 F (37.1 C)  (!)  97.2 F (36.2 C) (!) 97.4 F (36.3 C)  TempSrc: Axillary  Oral Oral  SpO2: 97%     Weight:  92 kg (202 lb 13.2 oz)    Height:        Intake/Output Summary (Last 24 hours) at 01/10/17 1521 Last data filed at 01/10/17 1000  Gross per 24 hour  Intake            12615 ml  Output            13267 ml  Net             -652 ml   Filed Weights   01/08/17 1624 01/09/17 0840 01/10/17 0835  Weight: 93.5 kg (206 lb 2.1 oz) 92.3 kg (203 lb 7.8 oz) 92 kg (202 lb 13.2 oz)   Examination: Physical Exam:  Constitutional: WN/WD Caucasian Male in NAD but not feeling well  Eyes: Sclerae anicteric. Lids normal ENMT: has a small ucler on bridge of nose. Grossly normal hearing. MMM Neck: Supple with no JVD Respiratory: CTAB; Unlabored breathing. No appreciable wheezing/rales/rhonchi Cardiovascular: RRR; No extremity edema Abdomen: Soft, NT, ND. Bowel sounds present; PD Catheter in place GU: Deferred Musculoskeletal: Left Metatarsal Amputation with gangrenous changes; Has Left Upper Arm Fistula Skin: Warm and Dry. Left foot gangrenous changes with some bleeding. Has a small Right Ulcer on foot. No other rashes or lesions appreciated Neurologic: CN 2-12 grossly intact. No appreciable focal deficits  Psychiatric: Normal mood but flat affect. Intact judgement and insight  Data Reviewed: I have personally reviewed following labs and imaging studies  CBC:  Recent Labs Lab 01/06/17 0526 01/06/17 1915 01/08/17 0359 01/09/17 0801 01/10/17 0818  WBC 10.2 13.3* 8.0 11.5* 10.2  NEUTROABS  --   --  6.1 9.3* 7.7  HGB 9.9* 9.4* 8.6* 9.4* 9.5*  HCT 30.3* 28.8* 26.5* 28.6* 29.7*  MCV 95.6 94.7 94.6 93.5 94.0  PLT 389 425* 410* 461* 471*   Basic Metabolic Panel:  Recent Labs Lab 01/06/17 0526 01/06/17 1915 01/07/17 0948 01/08/17 0359 01/09/17 0801 01/10/17 0123 01/10/17 0818  NA 133*  --  132* 131* 132* 130*  --   K 3.6  --  3.2* 3.3* 3.4* 3.4*  --   CL 96*  --  93* 92* 95* 95*  --   CO2 25   --  29 28 26  24  --   GLUCOSE 111*  --  218* 344* 167* 188*  --   BUN 53*  --  53* 53* 46* 50*  --   CREATININE 6.27* 6.87* 6.31* 6.47* 6.22* 6.36*  --   CALCIUM 8.4*  --  8.2* 8.1* 8.1* 8.0*  --   MG  --   --  1.6* 1.5* 1.6*  --  1.8  PHOS 3.6  --  3.8 3.6 3.1 4.0 4.0   GFR: Estimated Creatinine Clearance: 11.2 mL/min (A) (by C-G formula based on SCr of 6.36 mg/dL (H)). Liver Function Tests:  Recent Labs Lab 01/06/17 0526 01/07/17 0948 01/08/17 0359 01/09/17 0801 01/10/17 0123  AST  --  41 79* 269*  --   ALT  --  28 33 53  --   ALKPHOS  --  60 63 67  --   BILITOT  --  0.4 0.4 0.2*  --   PROT  --  5.6* 5.9* 5.9*  --   ALBUMIN 1.6* 1.3* 1.3* 1.4* 1.4*   No results for input(s): LIPASE, AMYLASE in the last 168 hours. No results for input(s): AMMONIA in the last 168 hours. Coagulation Profile:  Recent Labs Lab 01/09/17 1424  INR 1.20   Cardiac Enzymes:  Recent Labs Lab 01/07/17 0948 01/08/17 1056 01/08/17 1604 01/08/17 2227  CKTOTAL 52  --   --   --   TROPONINI  --  0.27* 0.51* 1.73*   BNP (last 3 results) No results for input(s): PROBNP in the last 8760 hours. HbA1C: No results for input(s): HGBA1C in the last 72 hours. CBG:  Recent Labs Lab 01/09/17 1340 01/09/17 1615 01/09/17 2104 01/10/17 0808 01/10/17 1205  GLUCAP 144* 65 117* 239* 188*   Lipid Profile: No results for input(s): CHOL, HDL, LDLCALC, TRIG, CHOLHDL, LDLDIRECT in the last 72 hours. Thyroid Function Tests: No results for input(s): TSH, T4TOTAL, FREET4, T3FREE, THYROIDAB in the last 72 hours. Anemia Panel: No results for input(s): VITAMINB12, FOLATE, FERRITIN, TIBC, IRON, RETICCTPCT in the last 72 hours. Sepsis Labs: No results for input(s): PROCALCITON, LATICACIDVEN in the last 168 hours.  Recent Results (from the past 240 hour(s))  Culture, Urine     Status: None   Collection Time: 01/02/17 12:14 PM  Result Value Ref Range Status   Specimen Description URINE, CATHETERIZED  Final     Special Requests NONE  Final   Culture NO GROWTH  Final   Report Status 01/03/2017 FINAL  Final  Culture, blood (routine x 2)     Status: None   Collection Time: 01/03/17 10:41 PM  Result Value Ref Range Status   Specimen Description BLOOD RIGHT HAND  Final   Special Requests   Final    BOTTLES DRAWN AEROBIC AND ANAEROBIC Blood Culture adequate volume   Culture NO GROWTH 5 DAYS  Final   Report Status 01/09/2017 FINAL  Final  Culture, blood (routine x 2)     Status: None   Collection Time: 01/03/17 10:41 PM  Result Value Ref Range Status   Specimen Description BLOOD RIGHT ANTECUBITAL  Final   Special Requests   Final    BOTTLES DRAWN AEROBIC AND ANAEROBIC Blood Culture adequate volume   Culture NO GROWTH 5 DAYS  Final   Report Status 01/09/2017 FINAL  Final  MRSA PCR Screening     Status: None   Collection Time: 01/08/17  4:15 PM  Result Value Ref Range Status   MRSA by PCR NEGATIVE NEGATIVE Final  Comment:        The GeneXpert MRSA Assay (FDA approved for NASAL specimens only), is one component of a comprehensive MRSA colonization surveillance program. It is not intended to diagnose MRSA infection nor to guide or monitor treatment for MRSA infections.     Radiology Studies: No results found.   Scheduled Meds: . aspirin  81 mg Oral Daily  . calcium acetate  667 mg Oral TID WC  . carvedilol  12.5 mg Oral QHS  . celecoxib  100 mg Oral Daily  . clopidogrel  75 mg Oral Q breakfast  . darbepoetin (ARANESP) injection - NON-DIALYSIS  200 mcg Subcutaneous Q Tue-1800  . diclofenac sodium  2 g Topical QID  . docusate sodium  100 mg Oral BID  . feeding supplement (NEPRO CARB STEADY)  237 mL Oral BID BM  . gentamicin cream  1 application Topical Daily  . gentamicin cream   Topical TID  . insulin aspart  0-9 Units Subcutaneous TID WC  . insulin NPH Human  40 Units Subcutaneous QAC breakfast  . insulin NPH Human  45 Units Subcutaneous QHS  . midodrine  5 mg Oral BID WC  .  multivitamin  1 tablet Oral QHS  . senna  1 tablet Oral BID  . sodium chloride flush  3 mL Intravenous Q12H  . sodium chloride flush  3 mL Intravenous Q12H  . Vitamin D (Ergocalciferol)  50,000 Units Oral Q30 days   Continuous Infusions: . sodium chloride 250 mL (01/08/17 2000)  . sodium chloride    . cefTAZidime (FORTAZ)  IV Stopped (01/09/17 2314)  . DAPTOmycin (CUBICIN)  IV Stopped (01/08/17 2116)  . dialysis solution 2.5% low-MG/low-CA    . nitroGLYCERIN 5 mcg/min (01/08/17 1612)    LOS: 4 days   Merlene Laughter, DO [facility] Pager [phone]  If 7PM-7AM, please contact night-coverage [email or web address] Password [email or web address] 01/10/2017, 3:21 PM

## 2017-01-10 NOTE — Care Management Note (Addendum)
Case Management Note  Patient Details  Name: LONELL STAMOS MRN: [id number] Date of Birth: 06/29/1940  Subjective/Objective:     Pt admitted from rehab with progressive foot infection - s/p transmetatarsal amputation              Action/Plan:   PTA pt was in CIR - prior to that he was from home independent with wife on peritoneal dialysis.  CIR following with plans to readmit pt when medically stable. Per pt -  Pt has BKA amputation surgery planned for 10/3.  CM will continue to follow for discharge needs   Expected Discharge Date:  01/10/17               Expected Discharge Plan:  IP Rehab Facility  In-House Referral:  Clinical Social Work  Discharge planning Services  CM Consult  Post Acute Care Choice:    Choice offered to:     DME Arranged:    DME Agency:     HH Arranged:    HH Agency:     Status of Service:     If discussed at [employer], dates discussed:    Additional Comments:  Cherylann Parr, RN 01/10/2017, 9:23 AM

## 2017-01-10 NOTE — Progress Notes (Signed)
Progress Note  Patient Name: [name] Date of Encounter: 01/10/2017  Primary Cardiologist: [facility]  Subjective   No chest pain Jaw still hurts LLE pain  Inpatient Medications    Scheduled Meds: . aspirin  81 mg Oral Daily  . calcium acetate  667 mg Oral TID WC  . carvedilol  12.5 mg Oral QHS  . celecoxib  100 mg Oral Daily  . clopidogrel  75 mg Oral Q breakfast  . [START ON 01/12/2017] darbepoetin (ARANESP) injection - DIALYSIS  200 mcg Subcutaneous Q Thu-HD  . diclofenac sodium  2 g Topical QID  . docusate sodium  100 mg Oral BID  . feeding supplement (NEPRO CARB STEADY)  237 mL Oral BID BM  . gentamicin cream  1 application Topical Daily  . gentamicin cream   Topical TID  . insulin aspart  0-9 Units Subcutaneous TID WC  . insulin NPH Human  40 Units Subcutaneous QAC breakfast  . insulin NPH Human  45 Units Subcutaneous QHS  . midodrine  5 mg Oral BID WC  . multivitamin  1 tablet Oral QHS  . senna  1 tablet Oral BID  . sodium chloride flush  3 mL Intravenous Q12H  . sodium chloride flush  3 mL Intravenous Q12H  . Vitamin D (Ergocalciferol)  50,000 Units Oral Q30 days   Continuous Infusions: . sodium chloride 250 mL (01/08/17 2000)  . sodium chloride    . cefTAZidime (FORTAZ)  IV Stopped (01/09/17 2314)  . DAPTOmycin (CUBICIN)  IV Stopped (01/08/17 2116)  . dialysis solution 1.5% low-MG/low-CA    . dialysis solution 2.5% low-MG/low-CA    . nitroGLYCERIN 5 mcg/min (01/08/17 1612)   PRN Meds: sodium chloride, sodium chloride, acetaminophen, dianeal solution for CAPD/CCPD with heparin, hydrALAZINE, morphine injection, nitroGLYCERIN, ondansetron (ZOFRAN) IV, ondansetron, oxyCODONE-acetaminophen, sodium chloride flush, sodium chloride flush   Vital Signs    Vitals:   01/10/17 0100 01/10/17 0200 01/10/17 0400 01/10/17 0840  BP: 133/66 120/68 134/67 105/85  Pulse: 85 88 86 89  Resp: (!) (!) 26  Temp:   98.7 F (37.1 C) (!) 97.2 F (36.2 C)    TempSrc:   Axillary Oral  SpO2: 100% 96% 97%   Weight:      Height:        Intake/Output Summary (Last 24 hours) at 01/10/17 0901 Last data filed at 01/09/17 1000  Gross per 24 hour  Intake               87 ml  Output                0 ml  Net               87 ml   Filed Weights   01/07/17 1835 01/08/17 1624 01/09/17 0840  Weight: 200 lb 13.4 oz (91.1 kg) 206 lb 2.1 oz (93.5 kg) 203 lb 7.8 oz (92.3 kg)    Telemetry    NSR 01/10/2017  - Personally Reviewed  ECG    SR diffuse lateral ST depressoin  - Personally Reviewed  Physical Exam  Chronically ill white male  Affect appropriate Healthy:  appears stated age HEENT:eschar on bridge of nose  Neck supple with no adenopathy JVP normal no bruits no thyromegaly Lungs clear with no wheezing and good diaphragmatic motion Heart:  S1/S2 SEM murmur, no rub, gallop or click PMI normal Abdomen: benighn, BS positve, no tenderness, no AAA no bruit.  No HSM or  HJR Distal pulses intact with no bruits No edema Neuro non-focal Left transmetatarsal amputation dressed LFA cath sight A Right angio sight Small hematoma  Has fistula with good thrill in LUE   Labs    Chemistry  Recent Labs Lab 01/07/17 0948 01/08/17 0359 01/09/17 0801 01/10/17 0123  NA 132* 131* 132* 130*  K 3.2* 3.3* 3.4* 3.4*  CL 93* 92* 95* 95*  CO2 GLUCOSE 218* 344* 167* 188*  BUN 53* 53* 46* 50*  CREATININE 6.31* 6.47* 6.22* 6.36*  CALCIUM 8.2* 8.1* 8.1* 8.0*  PROT 5.6* 5.9* 5.9*  --   ALBUMIN 1.3* 1.3* 1.4* 1.4*  AST 41 79* 269*  --   ALT 28 33 53  --   ALKPHOS 60 63 67  --   BILITOT 0.4 0.4 0.2*  --   GFRNONAA 8* 7* 8* 8*  GFRAA 9* 9* 9* 9*  ANIONGAP Hematology  Recent Labs Lab 01/06/17 1915 01/08/17 0359 01/09/17 0801  WBC 13.3* 8.0 11.5*  RBC 3.04* 2.80* 3.06*  HGB 9.4* 8.6* 9.4*  HCT 28.8* 26.5* 28.6*  MCV 94.7 94.6 93.5  MCH 30.9 30.7 30.7  MCHC 32.6 32.5 32.9  RDW 14.4 14.6 14.7  PLT 425* 410*  461*    Cardiac Enzymes  Recent Labs Lab 01/08/17 1056 01/08/17 1604 01/08/17 2227  TROPONINI 0.27* 0.51* 1.73*   No results for input(s): TROPIPOC in the last 168 hours.   BNPNo results for input(s): BNP, PROBNP in the last 168 hours.   DDimer No results for input(s): DDIMER in the last 168 hours.   Radiology    No results found.  Cardiac Studies   None   Patient Profile     76 y.o. male CABG 2011 stent to native RCA 07/2016 with NSTEMI. Chronic systolic CHF with EF 35% by echo 10/2016 , AICD, ESRD on peritoneal dialysis post left peroneal revascularization with subsequent transmetatarsal amputation. Non healing stump in need of left BKA. Last 24 hours with SSCP and SEMI ECG changes and elevated troponin  Assessment & Plan    1) CAD/CABG: with recent stent to RCA 07/2016.  Stable on heparin at this time with troponin peak less than 2. Cath 01/10/17 without critical stenosis and no need for BMS Ok to proceed with amputation for pain relief and prevent risk of sepsis. Still moderate risk but encouraging to know patients anatomy  2) CRF: on peritoneal dialysis Aranasp for anemia 3) PV:  Wound care and antibiotics  4) CHF:  EF 35% volume control with dialysis peritoneal at night   For questions or updates, please contact [facility] Please consult [email or web address] for contact info under Cardiology/STEMI.      Signed, Charlton Haws, MD  01/10/2017, 9:01 AM

## 2017-01-11 ENCOUNTER — Encounter (HOSPITAL_COMMUNITY)
Admission: AD | Disposition: A | Discharge status: Home / Self Care | Payer: Self-pay | Source: Intra-hospital | Attending: Family Medicine

## 2017-01-11 ENCOUNTER — Inpatient Hospital Stay (HOSPITAL_COMMUNITY): Payer: Medicare Other | Admitting: Anesthesiology

## 2017-01-11 ENCOUNTER — Inpatient Hospital Stay (HOSPITAL_COMMUNITY): Payer: Medicare Other

## 2017-01-11 ENCOUNTER — Encounter (HOSPITAL_COMMUNITY): Payer: Self-pay | Admitting: *Deleted

## 2017-01-11 DIAGNOSIS — J42 Unspecified chronic bronchitis: Secondary | ICD-10-CM

## 2017-01-11 DIAGNOSIS — Z794 Long term (current) use of insulin: Secondary | ICD-10-CM

## 2017-01-11 DIAGNOSIS — Z95810 Presence of automatic (implantable) cardiac defibrillator: Secondary | ICD-10-CM

## 2017-01-11 DIAGNOSIS — E876 Hypokalemia: Secondary | ICD-10-CM

## 2017-01-11 DIAGNOSIS — Z992 Dependence on renal dialysis: Secondary | ICD-10-CM

## 2017-01-11 DIAGNOSIS — E119 Type 2 diabetes mellitus without complications: Secondary | ICD-10-CM

## 2017-01-11 DIAGNOSIS — R740 Nonspecific elevation of levels of transaminase and lactic acid dehydrogenase [LDH]: Secondary | ICD-10-CM

## 2017-01-11 DIAGNOSIS — N186 End stage renal disease: Secondary | ICD-10-CM

## 2017-01-11 HISTORY — PX: AMPUTATION: SHX166

## 2017-01-11 LAB — CBC WITH DIFFERENTIAL/PLATELET
BASOS ABS: 0 10*3/uL (ref 0.0–0.1)
BASOS PCT: 0 %
EOS PCT: 1 %
Eosinophils Absolute: 0.1 10*3/uL (ref 0.0–0.7)
HCT: 28.2 % — ABNORMAL LOW (ref 39.0–52.0)
Hemoglobin: 9.1 g/dL — ABNORMAL LOW (ref 13.0–17.0)
Lymphocytes Relative: 12 %
Lymphs Abs: 1.2 10*3/uL (ref 0.7–4.0)
MCH: 30.6 pg (ref 26.0–34.0)
MCHC: 32.3 g/dL (ref 30.0–36.0)
MCV: 94.9 fL (ref 78.0–100.0)
MONO ABS: 0.7 10*3/uL (ref 0.1–1.0)
MONOS PCT: 7 %
Neutro Abs: 7.9 10*3/uL — ABNORMAL HIGH (ref 1.7–7.7)
Neutrophils Relative %: 80 %
PLATELETS: 468 10*3/uL — AB (ref 150–400)
RBC: 2.97 MIL/uL — ABNORMAL LOW (ref 4.22–5.81)
RDW: 15.5 % (ref 11.5–15.5)
WBC: 9.9 10*3/uL (ref 4.0–10.5)

## 2017-01-11 LAB — COMPREHENSIVE METABOLIC PANEL
ALK PHOS: 67 U/L (ref 38–126)
ALT: 105 U/L — ABNORMAL HIGH (ref 17–63)
AST: 566 U/L — ABNORMAL HIGH (ref 15–41)
Albumin: 1.4 g/dL — ABNORMAL LOW (ref 3.5–5.0)
Anion gap: 13 (ref 5–15)
BUN: 47 mg/dL — AB (ref 6–20)
CO2: 25 mmol/L (ref 22–32)
CREATININE: 6.14 mg/dL — AB (ref 0.61–1.24)
Calcium: 8.1 mg/dL — ABNORMAL LOW (ref 8.9–10.3)
Chloride: 90 mmol/L — ABNORMAL LOW (ref 101–111)
GFR calc Af Amer: 9 mL/min — ABNORMAL LOW (ref >60)
GFR, EST NON AFRICAN AMERICAN: 8 mL/min — AB (ref >60)
GLUCOSE: 273 mg/dL — AB (ref 65–99)
Potassium: 4.9 mmol/L (ref 3.5–5.1)
SODIUM: 128 mmol/L — AB (ref 135–145)
TOTAL PROTEIN: 5.6 g/dL — AB (ref 6.5–8.1)
Total Bilirubin: 2.2 mg/dL — ABNORMAL HIGH (ref 0.3–1.2)

## 2017-01-11 LAB — GLUCOSE, CAPILLARY
GLUCOSE-CAPILLARY: 139 mg/dL — AB (ref 65–99)
Glucose-Capillary: 260 mg/dL — ABNORMAL HIGH (ref 65–99)
Glucose-Capillary: 217 mg/dL — ABNORMAL HIGH (ref 65–99)
Glucose-Capillary: 96 mg/dL (ref 65–99)
Glucose-Capillary: 95 mg/dL (ref 65–99)

## 2017-01-11 LAB — PHOSPHORUS: Phosphorus: 5 mg/dL — ABNORMAL HIGH (ref 2.5–4.6)

## 2017-01-11 LAB — MAGNESIUM: MAGNESIUM: 2 mg/dL (ref 1.7–2.4)

## 2017-01-11 LAB — SURGICAL PCR SCREEN
MRSA, PCR: NEGATIVE
Staphylococcus aureus: NEGATIVE

## 2017-01-11 SURGERY — AMPUTATION BELOW KNEE
Anesthesia: General | Site: Leg Lower | Laterality: Left

## 2017-01-11 MED ORDER — MEPERIDINE HCL 25 MG/ML IJ SOLN: 6.2500 mg | INTRAMUSCULAR | Status: DC | PRN

## 2017-01-11 MED ORDER — PROPOFOL 10 MG/ML IV BOLUS
INTRAVENOUS | Status: AC
  Filled 2017-01-11: qty 20

## 2017-01-11 MED ORDER — MIDAZOLAM HCL 2 MG/2ML IJ SOLN
INTRAMUSCULAR | Status: AC
  Filled 2017-01-11: qty 2

## 2017-01-11 MED ORDER — ONDANSETRON HCL 4 MG PO TABS: 4.0000 mg | ORAL_TABLET | Freq: Four times a day (QID) | ORAL | Status: DC | PRN

## 2017-01-11 MED ORDER — FENTANYL CITRATE (PF) 100 MCG/2ML IJ SOLN
INTRAMUSCULAR | Status: AC
  Administered 2017-01-11: 50 ug via INTRAVENOUS
  Filled 2017-01-11: qty 2

## 2017-01-11 MED ORDER — FENTANYL CITRATE (PF) 100 MCG/2ML IJ SOLN: 25.0000 ug | INTRAMUSCULAR | Status: DC | PRN

## 2017-01-11 MED ORDER — METOCLOPRAMIDE HCL 5 MG/ML IJ SOLN: 5.0000 mg | Freq: Three times a day (TID) | INTRAMUSCULAR | Status: DC | PRN

## 2017-01-11 MED ORDER — LIDOCAINE 2% (20 MG/ML) 5 ML SYRINGE
INTRAMUSCULAR | Status: AC
  Filled 2017-01-11: qty 5

## 2017-01-11 MED ORDER — ONDANSETRON HCL 4 MG/2ML IJ SOLN: 4.0000 mg | Freq: Once | INTRAMUSCULAR | Status: DC | PRN

## 2017-01-11 MED ORDER — DOCUSATE SODIUM 100 MG PO CAPS
100.0000 mg | ORAL_CAPSULE | Freq: Two times a day (BID) | ORAL | Status: DC
  Administered 2017-01-12 – 2017-01-17 (×11): 100 mg via ORAL
  Filled 2017-01-11 (×11): qty 1

## 2017-01-11 MED ORDER — SODIUM CHLORIDE 0.9 % IV SOLN
INTRAVENOUS | Status: DC
  Administered 2017-01-11 – 2017-01-12 (×2): via INTRAVENOUS

## 2017-01-11 MED ORDER — ONDANSETRON HCL 4 MG/2ML IJ SOLN
INTRAMUSCULAR | Status: AC
  Filled 2017-01-11: qty 2

## 2017-01-11 MED ORDER — CEFAZOLIN SODIUM-DEXTROSE 2-3 GM-% IV SOLR
INTRAVENOUS | Status: DC | PRN
  Administered 2017-01-11: 2 g via INTRAVENOUS

## 2017-01-11 MED ORDER — SODIUM CHLORIDE 0.9 % IV SOLN
INTRAVENOUS | Status: DC
  Administered 2017-01-11: 10:00:00 via INTRAVENOUS

## 2017-01-11 MED ORDER — PROPOFOL 10 MG/ML IV BOLUS
INTRAVENOUS | Status: DC | PRN
  Administered 2017-01-11: 120 mg via INTRAVENOUS

## 2017-01-11 MED ORDER — LIDOCAINE HCL (CARDIAC) 20 MG/ML IV SOLN
INTRAVENOUS | Status: DC | PRN
  Administered 2017-01-11: 100 mg via INTRAVENOUS

## 2017-01-11 MED ORDER — METOCLOPRAMIDE HCL 5 MG PO TABS
5.0000 mg | ORAL_TABLET | Freq: Three times a day (TID) | ORAL | Status: DC | PRN
  Filled 2017-01-11: qty 2

## 2017-01-11 MED ORDER — ACETAMINOPHEN 650 MG RE SUPP: 650.0000 mg | Freq: Four times a day (QID) | RECTAL | Status: DC | PRN

## 2017-01-11 MED ORDER — BISACODYL 10 MG RE SUPP
10.0000 mg | Freq: Every day | RECTAL | Status: DC | PRN
  Filled 2017-01-11: qty 1

## 2017-01-11 MED ORDER — OXYCODONE HCL 5 MG PO TABS
5.0000 mg | ORAL_TABLET | ORAL | Status: DC | PRN
  Administered 2017-01-11: 5 mg via ORAL
  Administered 2017-01-11 – 2017-01-13 (×4): 10 mg via ORAL
  Filled 2017-01-11: qty 2
  Filled 2017-01-11: qty 1
  Filled 2017-01-11 (×2): qty 2

## 2017-01-11 MED ORDER — 0.9 % SODIUM CHLORIDE (POUR BTL) OPTIME
TOPICAL | Status: DC | PRN
  Administered 2017-01-11: 1000 mL

## 2017-01-11 MED ORDER — ACETAMINOPHEN 325 MG PO TABS: 650.0000 mg | ORAL_TABLET | Freq: Four times a day (QID) | ORAL | Status: DC | PRN

## 2017-01-11 MED ORDER — CEFAZOLIN SODIUM-DEXTROSE 1-4 GM/50ML-% IV SOLN
1.0000 g | Freq: Four times a day (QID) | INTRAVENOUS | Status: DC
  Administered 2017-01-11: 1 g via INTRAVENOUS
  Filled 2017-01-11 (×3): qty 50

## 2017-01-11 MED ORDER — POLYETHYLENE GLYCOL 3350 17 G PO PACK: 17.0000 g | PACK | Freq: Every day | ORAL | Status: DC | PRN

## 2017-01-11 MED ORDER — HYDROMORPHONE HCL 1 MG/ML IJ SOLN: 1.0000 mg | INTRAMUSCULAR | Status: DC | PRN

## 2017-01-11 MED ORDER — FENTANYL CITRATE (PF) 250 MCG/5ML IJ SOLN
INTRAMUSCULAR | Status: AC
  Filled 2017-01-11: qty 5

## 2017-01-11 MED ORDER — METHOCARBAMOL 500 MG PO TABS
500.0000 mg | ORAL_TABLET | Freq: Four times a day (QID) | ORAL | Status: DC | PRN
  Administered 2017-01-11 – 2017-01-12 (×3): 500 mg via ORAL
  Filled 2017-01-11 (×2): qty 1

## 2017-01-11 MED ORDER — FENTANYL CITRATE (PF) 100 MCG/2ML IJ SOLN
INTRAMUSCULAR | Status: DC | PRN
Start: 2017-01-11 — End: 2017-01-11
  Administered 2017-01-11 (×3): 50 ug via INTRAVENOUS

## 2017-01-11 MED ORDER — SODIUM CHLORIDE 0.9 % IV SOLN
INTRAVENOUS | Status: DC | PRN
  Administered 2017-01-11: 11:00:00 via INTRAVENOUS

## 2017-01-11 MED ORDER — CEFAZOLIN SODIUM 1 G IJ SOLR
INTRAMUSCULAR | Status: AC
  Filled 2017-01-11: qty 20

## 2017-01-11 MED ORDER — METHOCARBAMOL 500 MG PO TABS
ORAL_TABLET | ORAL | Status: AC
  Administered 2017-01-11: 500 mg via ORAL
  Filled 2017-01-11: qty 1

## 2017-01-11 MED ORDER — OXYCODONE HCL 5 MG PO TABS
ORAL_TABLET | ORAL | Status: AC
  Filled 2017-01-11: qty 2

## 2017-01-11 MED ORDER — METHOCARBAMOL 1000 MG/10ML IJ SOLN
500.0000 mg | Freq: Four times a day (QID) | INTRAMUSCULAR | Status: DC | PRN
  Filled 2017-01-11: qty 5

## 2017-01-11 MED ORDER — ONDANSETRON HCL 4 MG/2ML IJ SOLN
4.0000 mg | Freq: Four times a day (QID) | INTRAMUSCULAR | Status: DC | PRN
  Administered 2017-01-12: 4 mg via INTRAVENOUS
  Filled 2017-01-11: qty 2

## 2017-01-11 MED ORDER — FENTANYL CITRATE (PF) 100 MCG/2ML IJ SOLN
25.0000 ug | INTRAMUSCULAR | Status: DC | PRN
  Administered 2017-01-11 (×3): 50 ug via INTRAVENOUS

## 2017-01-11 MED ORDER — MAGNESIUM CITRATE PO SOLN
1.0000 | Freq: Once | ORAL | Status: DC | PRN
  Filled 2017-01-11: qty 296

## 2017-01-11 MED ORDER — ONDANSETRON HCL 4 MG/2ML IJ SOLN
INTRAMUSCULAR | Status: DC | PRN
  Administered 2017-01-11: 4 mg via INTRAVENOUS

## 2017-01-11 SURGICAL SUPPLY — 34 items
BLADE SAW RECIP 87.9 MT (BLADE) ×4 IMPLANT
BLADE SURG 21 STRL SS (BLADE) ×2 IMPLANT
BNDG COHESIVE 6X5 TAN STRL LF (GAUZE/BANDAGES/DRESSINGS) ×4 IMPLANT
BNDG GAUZE ELAST 4 BULKY (GAUZE/BANDAGES/DRESSINGS) ×4 IMPLANT
COVER SURGICAL LIGHT HANDLE (MISCELLANEOUS) ×2 IMPLANT
CUFF TOURNIQUET SINGLE 34IN LL (TOURNIQUET CUFF) IMPLANT
CUFF TOURNIQUET SINGLE 44IN (TOURNIQUET CUFF) IMPLANT
DRAPE INCISE IOBAN 66X45 STRL (DRAPES) IMPLANT
DRAPE U-SHAPE 47X51 STRL (DRAPES) ×2 IMPLANT
DRESSING PREVENA PLUS CUSTOM (GAUZE/BANDAGES/DRESSINGS) ×1 IMPLANT
DRSG PREVENA PLUS CUSTOM (GAUZE/BANDAGES/DRESSINGS) ×2
DRSG VAC ATS MED SENSATRAC (GAUZE/BANDAGES/DRESSINGS) ×2 IMPLANT
ELECT REM PT RETURN 9FT ADLT (ELECTROSURGICAL) ×2
ELECTRODE REM PT RTRN 9FT ADLT (ELECTROSURGICAL) ×1 IMPLANT
GLOVE BIOGEL PI IND STRL 9 (GLOVE) ×1 IMPLANT
GLOVE BIOGEL PI INDICATOR 9 (GLOVE) ×1
GLOVE SURG ORTHO 9.0 STRL STRW (GLOVE) ×2 IMPLANT
GOWN STRL REUS W/ TWL XL LVL3 (GOWN DISPOSABLE) ×2 IMPLANT
GOWN STRL REUS W/TWL XL LVL3 (GOWN DISPOSABLE) ×2
KIT BASIN OR (CUSTOM PROCEDURE TRAY) ×2 IMPLANT
KIT ROOM TURNOVER OR (KITS) ×2 IMPLANT
MANIFOLD NEPTUNE II (INSTRUMENTS) ×2 IMPLANT
NS IRRIG 1000ML POUR BTL (IV SOLUTION) ×2 IMPLANT
PACK ORTHO EXTREMITY (CUSTOM PROCEDURE TRAY) ×2 IMPLANT
PAD ARMBOARD 7.5X6 YLW CONV (MISCELLANEOUS) ×2 IMPLANT
SPONGE LAP 18X18 X RAY DECT (DISPOSABLE) IMPLANT
STAPLER VISISTAT 35W (STAPLE) IMPLANT
STOCKINETTE IMPERVIOUS LG (DRAPES) ×4 IMPLANT
SUT SILK 2 0 (SUTURE) ×1
SUT SILK 2-0 18XBRD TIE 12 (SUTURE) ×1 IMPLANT
SUT VIC AB 1 CTX 27 (SUTURE) IMPLANT
TOWEL OR 17X26 10 PK STRL BLUE (TOWEL DISPOSABLE) ×2 IMPLANT
TUBE CONNECTING 20X1/4 (TUBING) ×2 IMPLANT
YANKAUER SUCT BULB TIP NO VENT (SUCTIONS) ×2 IMPLANT

## 2017-01-11 NOTE — Progress Notes (Signed)
PROGRESS NOTE    [name]  ZOX:[id number] DOB: [birth year] DOA: 01/06/2017 PCP: Dorice [name], MD   Brief Narrative: [name] is a 76 y.o. malewith hx of HTN, CVA, ESRD on PD, DM on insulin, COPD and CHFw AICD, hx CAD/ CABG 2011. Pt was admitted on 12/15/16 for non-healing L foot wound and R groin pseudoaneurysm, underwent repair of PA R groin and also resection of left 3rd metatarsal head by Dr Randie Heinz. However, the left foot progressed and developed gangrenous changes, so on 9/14 pt went for L transmetatarsal amputation by Dr Lajoyce Corners. Post op abx were stopped and pt admitted to rehab on 12/27/16.   On 9/23 pt had fevers and persistent ^WBC and tachycardia. Started on [place] due to concerns of possible infection. UcX and Blood cx's were negative. CXR was negative. Pt had increasing L foot pain w/ some skin changes/ blistering and serous drainage from L TMA wound. Dr Lajoyce Corners was consulted and per the chart, possible plan is for surgical intervention next week. Discussed with Dr. Lajoyce Corners and will see the patient Monday. C/w Abx for now.   Patient developed Chest Pain on 9/30 so he was given full dose ASA, SL NTG, and a Stat EKG and Troponin was ordered. EKG reviewed and showed significant ST Depressions (agreed by Cardiologist Dr. Donnie Aho) so Cardiology was consulted and patient was placed on a NTG gtt and Heparin gtt and transferred to SDU. Patient underwent catheterization and required no stenting and there was no critical stenosis identified. Cardiology cleared the patient and plan is for Left BKA in AM.    Assessment & Plan:   Principal Problem:   Left foot infection Active Problems:   ESRD on peritoneal dialysis (HCC)   CAD (coronary artery disease) of artery bypass graft   Chronic obstructive pulmonary disease (HCC)   Chronic congestive heart failure (HCC)   Chronic hypotension   ICD (implantable cardioverter-defibrillator) in place   IDDM (insulin dependent diabetes  mellitus) (HCC)   Foot infection   Hypokalemia   Hypomagnesemia   Elevated AST (SGOT)   Chest pain   NSTEMI (non-ST elevated myocardial infarction) (HCC)   Left foot infection with gangrene Plan for BKA today -orthopedic surgery recommendations -continue Fortaz and Daptomycin  Elevated AST/ALT Asymptomatic. Recent ultrasound significant for gallbladder polyp. -RUQ ultrasound -repeat CMP, if still trending up, consult GI  Acute chest pain NSTEMI S/p cath without critical stenosis. Cardiology recommended okay for surgery. Nitroglycerin and heparin drip discontinued. -continue Plavix and Aspirin  ESRD on PD -nephrology recommendations  History of CVA -continue aspirin and Plavix -holding atorvastatin while on daptomycin and with elevated transaminases  PVD S/p left peroneal artery revascularization. -continue aspirin and Plavix  Anemia of CKD Stable  Hypotension -continue Midodrine  CAD Hx of CABG ICD -continue carvedilol, aspirin and Plavix -isosorbide mononitrate discontinued by cardiology -atorvastatin on hold  Hypokalemia Hypomagensemia -Replete as needed  COPD No exacerbation. Stable.  Chronic systolic CHF No exacerbation. ICD in place. Last EF of 45% -continue strict I/O, daily weights  Hyponatremia Chronic. Stable.   DVT prophylaxis: subq heparin Code Status: Full code Family Communication: Wife at bedside Disposition Plan: Pending orthopedic workup   Consultants:   Orthopedic surgery  Nephrology  Cardiology  Procedures:  CARDIAC CATHETERIZATION  Ost RCA to Prox RCA lesion, 0 %stenosed.  LM lesion, 100 %stenosed.  LIMA.  SVG.  Origin lesion, 100 %stenosed.  SVG.  Dist Graft to Insertion lesion, 40 %stenosed.  1st Mrg lesion, 75 %stenosed.  Ost Cx to Prox Cx lesion, 80 %stenosed.  Ost 3rd Mrg lesion, 100 %stenosed.  Ost 1st Mrg lesion, 100 %stenosed.  Mid Cx lesion, 60  %stenosed.  Antimicrobials:  Ceftazidime  Daptomycin     Subjective: Some wrist pain when flexing/extending. Foot pain persistent.  Objective: Vitals:   01/11/17 0352 01/11/17 0400 01/11/17 0500 01/11/17 0726  BP:  (!) 157/45    Pulse: 85 83    Resp: (!) 22 17    Temp: 98.2 F (36.8 C)   98.2 F (36.8 C)  TempSrc: Axillary   Axillary  SpO2: 100%     Weight:   91 kg (200 lb 9.9 oz)   Height:        Intake/Output Summary (Last 24 hours) at 01/11/17 0829 Last data filed at 01/10/17 1625  Gross per 24 hour  Intake            12615 ml  Output            13542 ml  Net             -927 ml   Filed Weights   01/10/17 0835 01/10/17 1655 01/11/17 0500  Weight: 92 kg (202 lb 13.2 oz) 92.2 kg (203 lb 4.2 oz) 91 kg (200 lb 9.9 oz)    Examination:  General exam: Appears calm and comfortable Respiratory system: Clear to auscultation. Respiratory effort normal. Cardiovascular system: S1 & S2 heard, RRR. No murmurs, rubs, gallops or clicks. Gastrointestinal system: Abdomen is nondistended, soft and nontender. No organomegaly or masses felt. Normal bowel sounds heard. Central nervous system: Alert and oriented. No focal neurological deficits. Extremities: No edema. No calf tenderness. Left foot with transmetatarsal amputation and gangrene located on stump with surrounding erythema. Wrist pain with flexion/extension without tenderness on palpation. No swelling. Skin: No cyanosis. Right 5th digit ulcer appears gangrenous. Psychiatry: Judgement and insight appear normal. Mood & affect appropriate.     Data Reviewed: I have personally reviewed following labs and imaging studies  CBC:  Recent Labs Lab 01/06/17 1915 01/08/17 0359 01/09/17 0801 01/10/17 0818 01/11/17 0216  WBC 13.3* 8.0 11.5* 10.2 9.9  NEUTROABS  --  6.1 9.3* 7.7 7.9*  HGB 9.4* 8.6* 9.4* 9.5* 9.1*  HCT 28.8* 26.5* 28.6* 29.7* 28.2*  MCV 94.7 94.6 93.5 94.0 94.9  PLT 425* 410* 461* 471* 468*   Basic  Metabolic Panel:  Recent Labs Lab 01/07/17 0948 01/08/17 0359 01/09/17 0801 01/10/17 0123 01/10/17 0818 01/11/17 0216  NA 132* 131* 132* 130*  --  128*  K 3.2* 3.3* 3.4* 3.4*  --  4.9  CL 93* 92* 95* 95*  --  90*  CO2 --  25  GLUCOSE 218* 344* 167* 188*  --  273*  BUN 53* 53* 46* 50*  --  47*  CREATININE 6.31* 6.47* 6.22* 6.36*  --  6.14*  CALCIUM 8.2* 8.1* 8.1* 8.0*  --  8.1*  MG 1.6* 1.5* 1.6*  --  1.8 2.0  PHOS 3.8 3.6 3.1 4.0 4.0 5.0*   GFR: Estimated Creatinine Clearance: 11.6 mL/min (A) (by C-G formula based on SCr of 6.14 mg/dL (H)). Liver Function Tests:  Recent Labs Lab 01/07/17 0948 01/08/17 0359 01/09/17 0801 01/10/17 0123 01/11/17 0216  AST 41 79* 269*  --  566*  ALT 28 33 53  --  105*  ALKPHOS 60 63 67  --  67  BILITOT 0.4 0.4 0.2*  --  2.2*  PROT 5.6* 5.9*  5.9*  --  5.6*  ALBUMIN 1.3* 1.3* 1.4* 1.4* 1.4*   No results for input(s): LIPASE, AMYLASE in the last 168 hours. No results for input(s): AMMONIA in the last 168 hours. Coagulation Profile:  Recent Labs Lab 01/09/17 1424  INR 1.20   Cardiac Enzymes:  Recent Labs Lab 01/07/17 0948 01/08/17 1056 01/08/17 1604 01/08/17 2227  CKTOTAL 52  --   --   --   TROPONINI  --  0.27* 0.51* 1.73*   BNP (last 3 results) No results for input(s): PROBNP in the last 8760 hours. HbA1C: No results for input(s): HGBA1C in the last 72 hours. CBG:  Recent Labs Lab 01/10/17 0808 01/10/17 1205 01/10/17 1627 01/10/17 2105 01/11/17 0728  GLUCAP 239* 188* 108* 192* 260*   Lipid Profile: No results for input(s): CHOL, HDL, LDLCALC, TRIG, CHOLHDL, LDLDIRECT in the last 72 hours. Thyroid Function Tests: No results for input(s): TSH, T4TOTAL, FREET4, T3FREE, THYROIDAB in the last 72 hours. Anemia Panel: No results for input(s): VITAMINB12, FOLATE, FERRITIN, TIBC, IRON, RETICCTPCT in the last 72 hours. Sepsis Labs: No results for input(s): PROCALCITON, LATICACIDVEN in the last 168  hours.  Recent Results (from the past 240 hour(s))  Culture, Urine     Status: None   Collection Time: 01/02/17 12:14 PM  Result Value Ref Range Status   Specimen Description URINE, CATHETERIZED  Final   Special Requests NONE  Final   Culture NO GROWTH  Final   Report Status 01/03/2017 FINAL  Final  Culture, blood (routine x 2)     Status: None   Collection Time: 01/03/17 10:41 PM  Result Value Ref Range Status   Specimen Description BLOOD RIGHT HAND  Final   Special Requests   Final    BOTTLES DRAWN AEROBIC AND ANAEROBIC Blood Culture adequate volume   Culture NO GROWTH 5 DAYS  Final   Report Status 01/09/2017 FINAL  Final  Culture, blood (routine x 2)     Status: None   Collection Time: 01/03/17 10:41 PM  Result Value Ref Range Status   Specimen Description BLOOD RIGHT ANTECUBITAL  Final   Special Requests   Final    BOTTLES DRAWN AEROBIC AND ANAEROBIC Blood Culture adequate volume   Culture NO GROWTH 5 DAYS  Final   Report Status 01/09/2017 FINAL  Final  MRSA PCR Screening     Status: None   Collection Time: 01/08/17  4:15 PM  Result Value Ref Range Status   MRSA by PCR NEGATIVE NEGATIVE Final    Comment:        The GeneXpert MRSA Assay (FDA approved for NASAL specimens only), is one component of a comprehensive MRSA colonization surveillance program. It is not intended to diagnose MRSA infection nor to guide or monitor treatment for MRSA infections.   Surgical pcr screen     Status: None   Collection Time: 01/11/17  4:00 AM  Result Value Ref Range Status   MRSA, PCR NEGATIVE NEGATIVE Final   Staphylococcus aureus NEGATIVE NEGATIVE Final    Comment: (NOTE) The Xpert SA Assay (FDA approved for NASAL specimens in patients 33 years of age and older), is one component of a comprehensive surveillance program. It is not intended to diagnose infection nor to guide or monitor treatment.          Radiology Studies: No results found.      Scheduled Meds: .  aspirin  81 mg Oral Daily  . calcium acetate  667 mg Oral TID WC  .  carvedilol  12.5 mg Oral QHS  . clopidogrel  75 mg Oral Q breakfast  . darbepoetin (ARANESP) injection - NON-DIALYSIS  200 mcg Subcutaneous Q Tue-1800  . diclofenac sodium  2 g Topical QID  . docusate sodium  100 mg Oral BID  . feeding supplement (NEPRO CARB STEADY)  237 mL Oral BID BM  . gentamicin cream  1 application Topical Daily  . gentamicin cream   Topical TID  . heparin subcutaneous  5,000 Units Subcutaneous Q8H  . insulin aspart  0-9 Units Subcutaneous TID WC  . insulin NPH Human  40 Units Subcutaneous QAC breakfast  . insulin NPH Human  45 Units Subcutaneous QHS  . midodrine  5 mg Oral BID WC  . multivitamin  1 tablet Oral QHS  . senna  1 tablet Oral BID  . sodium chloride flush  3 mL Intravenous Q12H  . sodium chloride flush  3 mL Intravenous Q12H  . Vitamin D (Ergocalciferol)  50,000 Units Oral Q30 days   Continuous Infusions: . sodium chloride 250 mL (01/08/17 2000)  . sodium chloride    .  ceFAZolin (ANCEF) IV    . cefTAZidime (FORTAZ)  IV Stopped (01/10/17 2305)  . DAPTOmycin (CUBICIN)  IV Stopped (01/10/17 2141)  . dialysis solution 2.5% low-MG/low-CA       LOS: 5 days     Jacquelin Hawking, MD [facility] 01/11/2017, 8:29 AM Pager: [phone]  If 7PM-7AM, please contact night-coverage [email or web address] Password [email or web address] 01/11/2017, 8:29 AM

## 2017-01-11 NOTE — Anesthesia Procedure Notes (Signed)
Procedure Name: LMA Insertion Date/Time: 01/11/2017 11:32 AM Performed by: Gavin Pound, Marlie Kuennen J Pre-anesthesia Checklist: Patient identified, Emergency Drugs available, Suction available, Timeout performed and Patient being monitored Patient Re-evaluated:Patient Re-evaluated prior to induction Oxygen Delivery Method: Circle system utilized Preoxygenation: Pre-oxygenation with 100% oxygen Induction Type: IV induction Ventilation: Mask ventilation without difficulty LMA: LMA inserted LMA Size: 4.0 Number of attempts: 1 Placement Confirmation: positive ETCO2 and breath sounds checked- equal and bilateral Tube secured with: Tape Dental Injury: Teeth and Oropharynx as per pre-operative assessment

## 2017-01-11 NOTE — Op Note (Signed)
   Date of Surgery: 01/11/2017  INDICATIONS: [name] is a 76 y.o.-year-old male who is status post revascularization to the left lower extremity and a transmetatarsal amputation. Patient has had progressive dehiscence and necrosis to the residual limb and presents at this time for transtibial amputation.[name]  PREOPERATIVE DIAGNOSIS: Peripheral vascular disease with gangrenous changes of the transmetatarsal amputation left foot  POSTOPERATIVE DIAGNOSIS: Same.  PROCEDURE: Transtibial amputation Application of Prevena wound VAC  SURGEON: Lajoyce Corners, M.D.  ANESTHESIA:  general  IV FLUIDS AND URINE: See anesthesia.  ESTIMATED BLOOD LOSS: Minimal mL.  COMPLICATIONS: None.  DESCRIPTION OF PROCEDURE: The patient was brought to the operating room and underwent a general anesthetic. After adequate levels of anesthesia were obtained patient's lower extremity was prepped using DuraPrep draped into a sterile field. A timeout was called. The foot was draped out of the sterile field with impervious stockinette. A transverse incision was made 11 cm distal to the tibial tubercle. This curved proximally and a large posterior flap was created. The tibia was transected 1 cm proximal to the skin incision. The fibula was transected just proximal to the tibial incision. The tibia was beveled anteriorly. A large posterior flap was created. The sciatic nerve was pulled cut and allowed to retract. The vascular bundles were suture ligated with 2-0 silk. The deep and superficial fascial layers were closed using #1 Vicryl. The skin was closed using staples and 2-0 nylon. The wound was covered with a Prevena wound VAC. There was a good suction fit. A prosthetic shrinker was applied. Patient was extubated taken to the PACU in stable condition.  Joshua Baker, MD [facility] 11:59 AM

## 2017-01-11 NOTE — Transfer of Care (Signed)
Immediate Anesthesia Transfer of Care Note  Patient: [name]  Procedure(s) Performed: LEFT BELOW KNEE AMPUTATION (Left Leg Lower)  Patient Location: PACU  Anesthesia Type:General  Level of Consciousness: awake  Airway & Oxygen Therapy: Patient Spontanous Breathing  Post-op Assessment: Report given to RN and Post -op Vital signs reviewed and stable  Post vital signs: Reviewed and stable  Last Vitals:  Vitals:   01/11/17 0400 01/11/17 0726  BP: (!) 157/45 (!) 141/61  Pulse: 83 92  Resp: 17 (!) 26  Temp:  36.8 C  SpO2:  99%    Last Pain:  Vitals:   01/11/17 0726  TempSrc: Axillary  PainSc:       Patients Stated Pain Goal: 0 (01/10/17 0840)  Complications: No apparent anesthesia complications

## 2017-01-11 NOTE — Progress Notes (Signed)
Progress Note  Patient Name: [name] Date of Encounter: 01/11/2017  Primary Cardiologist: [facility]/ [facility]  Subjective   No chest pain ready to have surgery LLE pain   Inpatient Medications    Scheduled Meds: . aspirin  81 mg Oral Daily  . calcium acetate  667 mg Oral TID WC  . carvedilol  12.5 mg Oral QHS  . clopidogrel  75 mg Oral Q breakfast  . darbepoetin (ARANESP) injection - NON-DIALYSIS  200 mcg Subcutaneous Q Tue-1800  . diclofenac sodium  2 g Topical QID  . docusate sodium  100 mg Oral BID  . feeding supplement (NEPRO CARB STEADY)  237 mL Oral BID BM  . gentamicin cream  1 application Topical Daily  . gentamicin cream   Topical TID  . heparin subcutaneous  5,000 Units Subcutaneous Q8H  . insulin aspart  0-9 Units Subcutaneous TID WC  . insulin NPH Human  40 Units Subcutaneous QAC breakfast  . insulin NPH Human  45 Units Subcutaneous QHS  . midodrine  5 mg Oral BID WC  . multivitamin  1 tablet Oral QHS  . senna  1 tablet Oral BID  . sodium chloride flush  3 mL Intravenous Q12H  . sodium chloride flush  3 mL Intravenous Q12H  . Vitamin D (Ergocalciferol)  50,000 Units Oral Q30 days   Continuous Infusions: . sodium chloride 250 mL (01/08/17 2000)  . sodium chloride    .  ceFAZolin (ANCEF) IV    . cefTAZidime (FORTAZ)  IV Stopped (01/10/17 2305)  . DAPTOmycin (CUBICIN)  IV Stopped (01/10/17 2141)  . dialysis solution 2.5% low-MG/low-CA     PRN Meds: sodium chloride, sodium chloride, acetaminophen, dianeal solution for CAPD/CCPD with heparin, hydrALAZINE, morphine injection, nitroGLYCERIN, ondansetron (ZOFRAN) IV, ondansetron, oxyCODONE-acetaminophen, sodium chloride flush, sodium chloride flush   Vital Signs    Vitals:   01/11/17 0352 01/11/17 0400 01/11/17 0500 01/11/17 0726  BP:  (!) 157/45    Pulse: 85 83    Resp: (!) 22 17    Temp: 98.2 F (36.8 C)   98.2 F (36.8 C)  TempSrc: Axillary   Axillary  SpO2: 100%     Weight:   200 lb 9.9 oz (91  kg)   Height:        Intake/Output Summary (Last 24 hours) at 01/11/17 0832 Last data filed at 01/10/17 1625  Gross per 24 hour  Intake            12615 ml  Output            13542 ml  Net             -927 ml   Filed Weights   01/10/17 0835 01/10/17 1655 01/11/17 0500  Weight: 202 lb 13.2 oz (92 kg) 203 lb 4.2 oz (92.2 kg) 200 lb 9.9 oz (91 kg)    Telemetry    NSR 01/11/2017  - Personally Reviewed  ECG    SR diffuse lateral ST depressoin  - Personally Reviewed  Physical Exam  Chronically ill white male  Affect appropriate Healthy:  appears stated age HEENT:eschar on bridge of nose  Neck supple with no adenopathy JVP normal no bruits no thyromegaly Lungs clear with no wheezing and good diaphragmatic motion Heart:  S1/S2 SEM murmur, no rub, gallop or click PMI normal Abdomen: benighn, BS positve, no tenderness, no AAA no bruit.  No HSM or HJR Distal pulses intact with no bruits No edema Neuro non-focal Left transmetatarsal  amputation dressed LFA cath sight A Right angio sight Small hematoma  Has fistula with good thrill in LUE Left femoral artery cath sight A - soft no hematoma    Labs    Chemistry  Recent Labs Lab 01/08/17 0359 01/09/17 0801 01/10/17 0123 01/11/17 0216  NA 131* 132* 130* 128*  K 3.3* 3.4* 3.4* 4.9  CL 92* 95* 95* 90*  CO2 GLUCOSE 344* 167* 188* 273*  BUN 53* 46* 50* 47*  CREATININE 6.47* 6.22* 6.36* 6.14*  CALCIUM 8.1* 8.1* 8.0* 8.1*  PROT 5.9* 5.9*  --  5.6*  ALBUMIN 1.3* 1.4* 1.4* 1.4*  AST 79* 269*  --  566*  ALT 33 53  --  105*  ALKPHOS 63 67  --  67  BILITOT 0.4 0.2*  --  2.2*  GFRNONAA 7* 8* 8* 8*  GFRAA 9* 9* 9* 9*  ANIONGAP Hematology  Recent Labs Lab 01/09/17 0801 01/10/17 0818 01/11/17 0216  WBC 11.5* 10.2 9.9  RBC 3.06* 3.16* 2.97*  HGB 9.4* 9.5* 9.1*  HCT 28.6* 29.7* 28.2*  MCV 93.5 94.0 94.9  MCH 30.7 30.1 30.6  MCHC 32.9 32.0 32.3  RDW 14.7 15.1 15.5  PLT 461* 471* 468*     Cardiac Enzymes  Recent Labs Lab 01/08/17 1056 01/08/17 1604 01/08/17 2227  TROPONINI 0.27* 0.51* 1.73*   No results for input(s): TROPIPOC in the last 168 hours.   BNPNo results for input(s): BNP, PROBNP in the last 168 hours.   DDimer No results for input(s): DDIMER in the last 168 hours.   Radiology    No results found.  Cardiac Studies   None   Patient Profile     76 y.o. male CABG 2011 stent to native RCA 07/2016 with NSTEMI. Chronic systolic CHF with EF 35% by echo 10/2016 , AICD, ESRD on peritoneal dialysis post left peroneal revascularization with subsequent transmetatarsal amputation. Non healing stump in need of left BKA. Last 24 hours with SSCP and SEMI ECG changes and elevated troponin  Assessment & Plan    1) CAD/CABG: with recent stent to RCA 07/2016.  Stable on heparin at this time with troponin peak less than 2. Cath 01/10/17 without critical stenosis and no need for BMS Ok to proceed with amputation for pain relief and prevent risk of sepsis. Still moderate risk but encouraging to know patients anatomy  2) CRF: on peritoneal dialysis Aranasp for anemia 3) PV:  Wound care and antibiotics  4) CHF:  EF 35% volume control with dialysis peritoneal at night   Anesthesia can use esmolol intra op if needed if HR too high for cardioprotection  For questions or updates, please contact [facility] Please consult [email or web address] for contact info under Cardiology/STEMI.      Signed, Charlton Haws, MD  01/11/2017, 8:32 AM

## 2017-01-11 NOTE — H&P (View-Only) (Signed)
Patient ID: [name], male   DOB: 20-Mar-1941, 76 y.o.   MRN: [id number] Patient is cleared for surgery for left transtibial amputation due to ischemic gangrenous changes for the transmetatarsal amputation.  Will plan for surgery tomorrow Wednesday.

## 2017-01-11 NOTE — Progress Notes (Signed)
[facility] Rounding Note Subjective:    No PD issues Preparing for OR for L BKA Concerns about ulcer R small toe as well  Objective Vital signs in last 24 hours: Vitals:   01/11/17 0352 01/11/17 0400 01/11/17 0500 01/11/17 0726  BP:  (!) 157/45  (!) 141/61  Pulse: 85 83  92  Resp: (!) 22 17  (!) 26  Temp: 98.2 F (36.8 C)   98.2 F (36.8 C)  TempSrc: Axillary   Axillary  SpO2: 100%   99%  Weight:   91 kg (200 lb 9.9 oz)   Height:       Weight change: -0.3 kg (-10.6 oz)  Intake/Output Summary (Last 24 hours) at 01/11/17 1610 Last data filed at 01/10/17 1625  Gross per 24 hour  Intake              120 ml  Output              275 ml  Net             -155 ml    Physical Exam: WNWD male, lying in bed - wife and friend in with him Plethoric in appearance Excoriations bridge of nose healing VS as noted Regular S1S2 No S3 Lungs diminished at bases esp right Abdomen +BS, soft and not tender. PD catheter left abd with dry dressings and no abd tenderness Minimal LE edema today L TMA wrapped AND R foot/R small toe wrapped Dialysis Access: PD cath, left upper arm AVF   Recent Labs Lab 01/09/17 0801 01/10/17 0123 01/10/17 0818 01/11/17 0216  NA 132* 130*  --  128*  K 3.4* 3.4*  --  4.9  CL 95* 95*  --  90*  CO2 26 24  --  25  GLUCOSE 167* 188*  --  273*  BUN 46* 50*  --  47*  CREATININE 6.22* 6.36*  --  6.14*  CALCIUM 8.1* 8.0*  --  8.1*  PHOS 3.1 4.0 4.0 5.0*    Recent Labs Lab 01/08/17 0359 01/09/17 0801 01/10/17 0123 01/11/17 0216  AST 79* 269*  --  566*  ALT 33 53  --  105*  ALKPHOS 63 67  --  67  BILITOT 0.4 0.2*  --  2.2*  PROT 5.9* 5.9*  --  5.6*  ALBUMIN 1.3* 1.4* 1.4* 1.4*     Recent Labs Lab 01/06/17 1915  01/08/17 0359 01/09/17 0801 01/10/17 0818 01/11/17 0216  WBC 13.3*  --  8.0 11.5* 10.2 9.9  NEUTROABS  --   < > 6.1 9.3* 7.7 7.9*  HGB 9.4*  --  8.6* 9.4* 9.5* 9.1*  HCT 28.8*  --  26.5* 28.6* 29.7* 28.2*  MCV 94.7  --  94.6 93.5 94.0  94.9  PLT 425*  --  410* 461* 471* 468*  < > = values in this interval not displayed.  Recent Labs Lab 01/07/17 0948 01/08/17 1056 01/08/17 1604 01/08/17 2227  CKTOTAL 52  --   --   --   TROPONINI  --  0.27* 0.51* 1.73*     Recent Labs Lab 01/10/17 0808 01/10/17 1205 01/10/17 1627 01/10/17 2105 01/11/17 0728  GLUCAP 239* 188* 108* 192* 260*    Medications: Infusions: . sodium chloride 250 mL (01/08/17 2000)  . sodium chloride    .  ceFAZolin (ANCEF) IV    . cefTAZidime (FORTAZ)  IV Stopped (01/10/17 2305)  . DAPTOmycin (CUBICIN)  IV Stopped (01/10/17 2141)  . dialysis solution 2.5% low-MG/low-CA  Scheduled Medications: . aspirin  81 mg Oral Daily  . calcium acetate  667 mg Oral TID WC  . carvedilol  12.5 mg Oral QHS  . clopidogrel  75 mg Oral Q breakfast  . darbepoetin (ARANESP) injection - NON-DIALYSIS  200 mcg Subcutaneous Q Tue-1800  . diclofenac sodium  2 g Topical QID  . docusate sodium  100 mg Oral BID  . feeding supplement (NEPRO CARB STEADY)  237 mL Oral BID BM  . gentamicin cream  1 application Topical Daily  . gentamicin cream   Topical TID  . heparin subcutaneous  5,000 Units Subcutaneous Q8H  . insulin aspart  0-9 Units Subcutaneous TID WC  . insulin NPH Human  40 Units Subcutaneous QAC breakfast  . insulin NPH Human  45 Units Subcutaneous QHS  . midodrine  5 mg Oral BID WC  . multivitamin  1 tablet Oral QHS  . senna  1 tablet Oral BID  . sodium chloride flush  3 mL Intravenous Q12H  . sodium chloride flush  3 mL Intravenous Q12H  . Vitamin D (Ergocalciferol)  50,000 Units Oral Q30 days    Dialysis Orders:  [facility] [place] [facility] 6 exchanges/day one mid-day exchange (occurs around 9am).  2.5 liters of 2.5% per [facility] 2 liters of 2.5% for last fill and mid day drain. Pt says EDW "about 202" Dr. Denny Peon  1. PAD- s/p open repair of R common femoral artery pseudoaneurysm (VVS) and L TMA 12/23/16 Lajoyce Corners).  Fortaz and dapto -  for BKA this AM 2. Chest pain/+ troponins. Prior CABG. Stent to RCA 07/2016 d/t NSTEMI. ICD in place. Recurrent CP/+ trops. Cath 10/1 no intervention required.  3. ESRD- [facility], using 2.5% dextrose at home; Altered regimen for ease in hospital (no pause or day bag) Had been using 1/2 and 1/2 past few days  but extra volume on board on exam today -  all 2.5% bags last night. Weight down 3 lb. Same orders for tonight. Will have new EDW post amputation.  4. Orthostatic hypotension - midodrine 5. Anemia (ABLA)- TSAT 45% and Ferritin 2284, ^Aranesp 200 qThurs. Not given this admission that I could see (ordered "with HD" which may have been the issue). Reordered SQ and given 10/2  6. Constipation: miralax as needed 7. Hypokalemia: Monitor for need for replacement.  8. Myoclonus / Confusion: stopped gabapentin and robaxin. No further confusion 9. Secondary HPT - phoslo. Regular diet to promote nutrition 10. DM - per primary team 11. Abnormal transaminases - AST AND ALT elevated/rising and bili slow increase (so would seem leess likely cardiac related...) Per primary team   Camille Bal, MD [facility] [phone] Pager 01/11/2017, 9:37 AM

## 2017-01-11 NOTE — Anesthesia Postprocedure Evaluation (Signed)
Anesthesia Post Note  Patient: [name]  Procedure(s) Performed: LEFT BELOW KNEE AMPUTATION (Left Leg Lower)     Patient location during evaluation: PACU Anesthesia Type: General Level of consciousness: awake and alert Pain management: pain level controlled Vital Signs Assessment: post-procedure vital signs reviewed and stable Respiratory status: spontaneous breathing, nonlabored ventilation, respiratory function stable and patient connected to nasal cannula oxygen Cardiovascular status: blood pressure returned to baseline and stable Postop Assessment: no apparent nausea or vomiting Anesthetic complications: no    Last Vitals:  Vitals:   01/11/17 1345 01/11/17 1352  BP:  127/64  Pulse: 87 83  Resp: 19 20  Temp:    SpO2: 99% 97%    Last Pain:  Vitals:   01/11/17 1342  TempSrc:   PainSc: 7                  Macguire Holsinger

## 2017-01-11 NOTE — Interval H&P Note (Signed)
History and Physical Interval Note:  01/11/2017 8:24 AM  [name]  has presented today for surgery, with the diagnosis of Gangrene Left Foot Transmetatarsal Amputation  The various methods of treatment have been discussed with the patient and family. After consideration of risks, benefits and other options for treatment, the patient has consented to  Procedure(s): LEFT BELOW KNEE AMPUTATION (Left) as a surgical intervention .  The patient's history has been reviewed, patient examined, no change in status, stable for surgery.  I have reviewed the patient's chart and labs.  Questions were answered to the patient's satisfaction.     [name]

## 2017-01-11 NOTE — Anesthesia Preprocedure Evaluation (Addendum)
Anesthesia Evaluation  Patient identified by MRN, date of birth, ID band Patient awake    Reviewed: Allergy & Precautions, NPO status , Patient's Chart, lab work & pertinent test results, reviewed documented beta blocker date and time   History of Anesthesia Complications Negative for: history of anesthetic complications  Airway Mallampati: IV  TM Distance: >3 FB Neck ROM: Full    Dental  (+) Chipped, Dental Advisory Given   Pulmonary sleep apnea (BiPAP) and Continuous Positive Airway Pressure Ventilation , COPD, former smoker,    breath sounds clear to auscultation       Cardiovascular hypertension, Pt. on medications and Pt. on home beta blockers + CAD, + CABG, + Peripheral Vascular Disease and +CHF  + Cardiac Defibrillator  Rhythm:Regular Rate:Normal  EKG 12/2016 - 1st deg AVB with PVCs, septal Q waves  10/14/16 ECHO [facility]): MODERATE LV DYSFUNCTION (See above) WITH MILD LVH NORMAL RIGHT VENTRICULAR SYSTOLIC FUNCTION VALVULAR REGURGITATION: TRIVIAL AR, TRIVIAL MR, TRIVIAL PR, TRIVIAL TR NO VALVULAR STENOSIS  AICD - [employer] EMBLEM S-ICD 45CM [phone] [employer] EMBLEM S-ICD MRI PULSE GENERATOR - [id number]   Neuro/Psych CVA, No Residual Symptoms    GI/Hepatic negative GI ROS, Neg liver ROS,   Endo/Other  diabetes, Insulin DependentSjogren's, obesity  Renal/GU Dialysis and ESRFRenal disease (peritoneal)     Musculoskeletal Sjogren's   Abdominal (+) + obese,   Peds  Hematology  (+) Blood dyscrasia (Hb 10.5), anemia , plavix   Anesthesia Other Findings   Reproductive/Obstetrics                            Anesthesia Physical  Anesthesia Plan  ASA: IV  Anesthesia Plan: General   Post-op Pain Management:    Induction:   PONV Risk Score and Plan: 2 and Ondansetron, Treatment may vary due to age or medical condition and Propofol infusion  Airway Management Planned:  LMA  Additional Equipment: None  Intra-op Plan:   Post-operative Plan:   Informed Consent: I have reviewed the patients History and Physical, chart, labs and discussed the procedure including the risks, benefits and alternatives for the proposed anesthesia with the patient or authorized representative who has indicated his/her understanding and acceptance.   Dental advisory given  Plan Discussed with: CRNA  Anesthesia Plan Comments: (Sciatic nerve block with MAC for procedure)       Anesthesia Quick Evaluation

## 2017-01-12 ENCOUNTER — Inpatient Hospital Stay (HOSPITAL_COMMUNITY): Payer: Medicare Other

## 2017-01-12 ENCOUNTER — Encounter (HOSPITAL_COMMUNITY): Payer: Self-pay | Admitting: Orthopedic Surgery

## 2017-01-12 DIAGNOSIS — I25700 Atherosclerosis of coronary artery bypass graft(s), unspecified, with unstable angina pectoris: Secondary | ICD-10-CM

## 2017-01-12 DIAGNOSIS — I9589 Other hypotension: Secondary | ICD-10-CM

## 2017-01-12 LAB — COMPREHENSIVE METABOLIC PANEL
ALBUMIN: 1.3 g/dL — AB (ref 3.5–5.0)
ALT: 52 U/L (ref 17–63)
AST: 437 U/L — AB (ref 15–41)
Alkaline Phosphatase: 60 U/L (ref 38–126)
Anion gap: 13 (ref 5–15)
BUN: 46 mg/dL — AB (ref 6–20)
CHLORIDE: 92 mmol/L — AB (ref 101–111)
CO2: 25 mmol/L (ref 22–32)
CREATININE: 7 mg/dL — AB (ref 0.61–1.24)
Calcium: 7.9 mg/dL — ABNORMAL LOW (ref 8.9–10.3)
GFR calc Af Amer: 8 mL/min — ABNORMAL LOW (ref >60)
GFR, EST NON AFRICAN AMERICAN: 7 mL/min — AB (ref >60)
GLUCOSE: 335 mg/dL — AB (ref 65–99)
POTASSIUM: 3.7 mmol/L (ref 3.5–5.1)
SODIUM: 130 mmol/L — AB (ref 135–145)
Total Bilirubin: 0.3 mg/dL (ref 0.3–1.2)
Total Protein: 5.6 g/dL — ABNORMAL LOW (ref 6.5–8.1)

## 2017-01-12 LAB — GLUCOSE, CAPILLARY
GLUCOSE-CAPILLARY: 277 mg/dL — AB (ref 65–99)
Glucose-Capillary: 350 mg/dL — ABNORMAL HIGH (ref 65–99)
Glucose-Capillary: 308 mg/dL — ABNORMAL HIGH (ref 65–99)
Glucose-Capillary: 239 mg/dL — ABNORMAL HIGH (ref 65–99)

## 2017-01-12 LAB — CBC
HEMATOCRIT: 26.6 % — AB (ref 39.0–52.0)
Hemoglobin: 8.4 g/dL — ABNORMAL LOW (ref 13.0–17.0)
MCH: 30.3 pg (ref 26.0–34.0)
MCHC: 31.6 g/dL (ref 30.0–36.0)
MCV: 96 fL (ref 78.0–100.0)
PLATELETS: 437 10*3/uL — AB (ref 150–400)
RBC: 2.77 MIL/uL — ABNORMAL LOW (ref 4.22–5.81)
RDW: 15.3 % (ref 11.5–15.5)
WBC: 10.5 10*3/uL (ref 4.0–10.5)

## 2017-01-12 MED ORDER — GENTAMICIN SULFATE 0.1 % EX CREA
TOPICAL_CREAM | Freq: Two times a day (BID) | CUTANEOUS | Status: DC
  Administered 2017-01-12 – 2017-01-13 (×2): via TOPICAL
  Administered 2017-01-13: 1 via TOPICAL
  Administered 2017-01-14: 10:00:00 via TOPICAL
  Filled 2017-01-12: qty 15

## 2017-01-12 NOTE — Evaluation (Signed)
Occupational Therapy Evaluation Patient Details Name: [name] MRN: [id number] DOB: October 02, 1940 Today's Date: 01/12/2017    History of Present Illness [name] Comptonis a 76 y.o.malewith hx of HTN, CVA, ESRD on PD, DM on insulin, COPD and CHFw AICD, hx CAD/ CABG 2011. Pt was admitted on 12/15/16 for non-healing L foot wound and R groin pseudoaneurysm, underwent repair of PA R groin and also resection of left 3rd metatarsal head by Dr Randie Heinz. However, the left foot progressed and developed gangrenous changes, so on 9/14 pt went for L transmetatarsal amputation by Dr Lajoyce Corners. pt admitted to rehab on 12/27/16.    Clinical Impression   Pt admitted with above. He demonstrates the below listed deficits and will benefit from continued OT to maximize safety and independence with BADLs.  Pt presents to OT with generalized weakness, impaired cognition (questionable due to meds); impaired balance, pain.  He requires max A for ADLs and max A +2 for functional transfers.  He lives with wife who is supportive.  Recommend return to CIR for further rehab with plan to return home with wife afterwards.  Will follow acutely.       Follow Up Recommendations  CIR;Supervision/Assistance - 24 hour    Equipment Recommendations  None recommended by OT    Recommendations for Other Services Rehab consult     Precautions / Restrictions Precautions Precautions: Fall Precaution Comments: PD port Restrictions Weight Bearing Restrictions: Yes LLE Weight Bearing: Non weight bearing      Mobility Bed Mobility Overal bed mobility: Needs Assistance Bed Mobility: Supine to Sit     Supine to sit: Total assist;+2 for physical assistance     General bed mobility comments: Pt requires assist for all aspects.  He is very slow to initiate activity/movement   Transfers Overall transfer level: Needs assistance Equipment used: 2 person hand held assist Transfers: Sit to/from [employer] Sit  to Stand: Max assist;+2 physical assistance Stand pivot transfers: Max assist;+2 physical assistance       General transfer comment: assist for forward translation of trunk and assist to lift trunk.  Assist for balance and to pivot     Balance Overall balance assessment: Needs assistance Sitting-balance support: Feet supported Sitting balance-Leahy Scale: Poor Sitting balance - Comments: Pt initially sitting EOB with max A, but progressed to close min guard assist EOB  Postural control: Posterior lean Standing balance support: Bilateral upper extremity supported Standing balance-Leahy Scale: Poor Standing balance comment: requires max A +2                           ADL either performed or assessed with clinical judgement   ADL Overall ADL's : Needs assistance/impaired Eating/Feeding: Set up;Sitting   Grooming: Wash/dry hands;Wash/dry face;Oral care;Brushing hair;Minimal assistance;Sitting   Upper Body Bathing: Maximal assistance;Sitting   Lower Body Bathing: Maximal assistance;Sitting/lateral leans;Bed level   Upper Body Dressing : Maximal assistance;Sitting   Lower Body Dressing: Total assistance;Sit to/from stand;Sitting/lateral leans   Toilet Transfer: Maximal assistance;+2 for physical assistance;Stand-pivot;BSC   Toileting- Clothing Manipulation and Hygiene: Total assistance;Sit to/from stand       Functional mobility during ADLs: Maximal assistance;+2 for physical assistance General ADL Comments: Pt slow to respond and requires cues to sustain attention to simple activities      Vision Baseline Vision/History: Wears glasses Wears Glasses: At all times Patient Visual Report: No change from baseline       Perception     Praxis  Pertinent Vitals/Pain Pain Assessment: Faces Faces Pain Scale: Hurts little more Pain Location: Lt LE with transfers  Pain Descriptors / Indicators: Operative site guarding;Grimacing Pain Intervention(s): Limited  activity within patient's tolerance;Monitored during session;Premedicated before session;Repositioned     Hand Dominance Right   Extremity/Trunk Assessment Upper Extremity Assessment Upper Extremity Assessment: Generalized weakness   Lower Extremity Assessment Lower Extremity Assessment: Defer to PT evaluation       Communication Communication Communication: No difficulties   Cognition Arousal/Alertness: Awake/alert Behavior During Therapy: Flat affect Overall Cognitive Status: Impaired/Different from baseline Area of Impairment: Attention;Following commands;Problem solving                   Current Attention Level: Sustained;Focused   Following Commands: Follows one step commands inconsistently;Follows one step commands with increased time     Problem Solving: Slow processing;Decreased initiation;Difficulty sequencing;Requires verbal cues;Requires tactile cues General Comments: Pt requires mod - max cues for motor planning.  Easily distracts.  Follow one step commands intermittently.  ? if cognitive deficits may be med related.  RN aware    General Comments  wife and RN present.  BP 149/58 supine; 125/71 sitting; 146/67 after transfer to chair.  Pt denies dizziness.  VSS     Exercises     Shoulder Instructions      Home Living Family/patient expects to be discharged to:: Inpatient rehab Living Arrangements: Spouse/significant other Available Help at Discharge: Family;Available 24 hours/day Type of Home: Mobile home Home Access: Stairs to enter Entrance Stairs-Number of Steps: 1 Entrance Stairs-Rails: Left Home Layout: One level     Bathroom Shower/Tub: Walk-in shower;Door   [employer] Toilet: Standard     Home Equipment: Environmental consultant - 4 wheels;Cane - quad;Wheelchair - manual          Prior Functioning/Environment Level of Independence: Needs assistance  Gait / Transfers Assistance Needed: uses [name], RW ADL's / Homemaking Assistance Needed: A for socks,  can bath self in shower   Comments: reports hobbling around the house, using rollator as able. Legs would "give out" at times but denies having any falls.         OT Problem List: Decreased strength;Decreased activity tolerance;Impaired balance (sitting and/or standing);Decreased cognition;Decreased safety awareness;Decreased knowledge of use of DME or AE;Decreased knowledge of precautions;Pain      OT Treatment/Interventions: Self-care/ADL training;Therapeutic activities;Patient/family education;DME and/or AE instruction;Balance training;Therapeutic exercise;Cognitive remediation/compensation    OT Goals(Current goals can be found in the care plan section) Acute Rehab OT Goals Patient Stated Goal: To rehab then home  OT Goal Formulation: With patient/family Time For Goal Achievement: 01/26/17 Potential to Achieve Goals: Good ADL Goals Pt Will Perform Grooming: with set-up;sitting Pt Will Perform Upper Body Bathing: with supervision;with set-up;sitting Pt Will Perform Lower Body Bathing: with mod assist;sit to/from stand Pt Will Transfer to Toilet: with mod assist;stand pivot transfer;bedside commode Pt/caregiver will Perform Home Exercise Program: Increased strength;Both right and left upper extremity;With theraband;With written HEP provided;With Supervision  OT Frequency: Min 2X/week   Barriers to D/C:            Co-evaluation PT/OT/SLP Co-Evaluation/Treatment: Yes Reason for Co-Treatment: For patient/therapist safety;To address functional/ADL transfers   OT goals addressed during session: ADL's and self-care      AM-PAC PT "6 Clicks" Daily Activity     Outcome Measure Help from another person eating meals?: A Little Help from another person taking care of personal grooming?: A Little Help from another person toileting, which includes using toliet, bedpan, or urinal?: Total Help  from another person bathing (including washing, rinsing, drying)?: A Lot Help from another  person to put on and taking off regular upper body clothing?: A Lot Help from another person to put on and taking off regular lower body clothing?: Total 6 Click Score: 12   End of Session    Activity Tolerance:   Patient left:    OT Visit Diagnosis: Unsteadiness on feet (R26.81);Pain Pain - Right/Left: Left Pain - part of body: Leg                Time: 4098-1191 OT Time Calculation (min): 23 min Charges:  OT General Charges $OT Visit: 1 Visit OT Evaluation $OT Eval Moderate Complexity: 1 Mod G-Codes:     [employer], OTR/L [phone]   Virgina Organ, Jaylyn Iyer M 01/12/2017, 12:00 PM

## 2017-01-12 NOTE — Progress Notes (Signed)
PROGRESS NOTE    [name]  BJY:[id number] DOB: 1941-03-06 DOA: 01/06/2017 PCP: Dorice [name], MD   Brief Narrative: [name] is a 76 y.o. malewith hx of HTN, CVA, ESRD on PD, DM on insulin, COPD and CHFw AICD, hx CAD/ CABG 2011. Pt was admitted on 12/15/16 for non-healing L foot wound and R groin pseudoaneurysm, underwent repair of PA R groin and also resection of left 3rd metatarsal head by Dr Randie Heinz. However, the left foot progressed and developed gangrenous changes, so on 9/14 pt went for L transmetatarsal amputation by Dr Lajoyce Corners. Post op abx were stopped and pt admitted to rehab on 12/27/16.   On 9/23 pt had fevers and persistent ^WBC and tachycardia. Started on [place] due to concerns of possible infection. UcX and Blood cx's were negative. CXR was negative. Pt had increasing L foot pain w/ some skin changes/ blistering and serous drainage from L TMA wound. Dr Lajoyce Corners was consulted and per the chart, possible plan is for surgical intervention next week. Discussed with Dr. Lajoyce Corners and will see the patient Monday. C/w Abx for now.   Patient developed Chest Pain on 9/30 so he was given full dose ASA, SL NTG, and a Stat EKG and Troponin was ordered. EKG reviewed and showed significant ST Depressions (agreed by Cardiologist Dr. Donnie Aho) so Cardiology was consulted and patient was placed on a NTG gtt and Heparin gtt and transferred to SDU. Patient underwent catheterization and required no stenting and there was no critical stenosis identified. Cardiology cleared the patient and plan is for Left BKA in AM.    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 Wound vac in place. -orthopedic surgery recommendations -discontinue Fortaz and Daptomycin  Elevated AST/ALT Asymptomatic. Recent ultrasound significant for gallbladder polyp. RUQ ultrasound on 10/3 significant for sludge and shadow. Trending down. -hepatic function test in AM  Acute chest pain NSTEMI S/p cath without critical stenosis. Cardiology recommended okay for surgery. Nitroglycerin and heparin drip discontinued. -continue Plavix and Aspirin  ESRD on PD -nephrology recommendations  History of CVA -continue aspirin and Plavix -holding atorvastatin while on daptomycin and with elevated transaminases  PVD S/p left peroneal artery revascularization. -continue aspirin and Plavix  Anemia of CKD Stable  Hypotension -continue Midodrine  CAD Hx of CABG ICD -continue carvedilol, aspirin and Plavix -isosorbide mononitrate discontinued by cardiology -atorvastatin on hold  Hypokalemia Hypomagensemia -Replete as needed  COPD No exacerbation. Stable.  Chronic systolic CHF No exacerbation. ICD in place. Last EF of 45% -continue strict I/O, daily weights  Hyponatremia Chronic. Stable.   DVT prophylaxis: subq heparin Code Status: Full code Family Communication: Wife at bedside Disposition Plan: Pending improvement of liver function   Consultants:   Orthopedic surgery  Nephrology  Cardiology  Procedures:  CARDIAC CATHETERIZATION  Ost RCA to Prox RCA lesion, 0 %stenosed.  LM lesion, 100 %stenosed.  LIMA.  SVG.  Origin lesion, 100 %stenosed.  SVG.  Dist Graft to Insertion lesion, 40 %stenosed.  1st Mrg lesion, 75 %stenosed.  Ost Cx to Prox Cx lesion, 80 %stenosed.  Ost 3rd Mrg lesion, 100 %stenosed.  Ost 1st Mrg lesion, 100  %stenosed.  Mid Cx lesion, 60 %stenosed.  Antimicrobials:  Ceftazidime  Daptomycin     Subjective: Pain improved.  Objective: Vitals:   01/12/17 0400 01/12/17 0500 01/12/17 0828 01/12/17 1200  BP: (!) 105/40 (!) 102/48 (!) 117/59 (!) 123/47  Pulse: 79 82 85 83  Resp: Temp:   97.8 F (36.6 C) 98.3 F (36.8 C)  TempSrc:   Oral Oral  SpO2: 96% 94% 96% 99%  Weight:      Height:        Intake/Output Summary (Last 24 hours) at 01/12/17 1327 Last data filed at 01/12/17 0930  Gross per 24 hour  Intake         13603.17 ml  Output            13337 ml  Net           266.17 ml   Filed Weights   01/10/17 0835 01/10/17 1655 01/11/17 0500  Weight: 92 kg (202 lb 13.2 oz) 92.2 kg (203 lb 4.2 oz) 91 kg (200 lb 9.9 oz)    Examination:  General exam: Appears calm and comfortable Respiratory system: Clear to auscultation. Respiratory effort normal. Cardiovascular system: S1 & S2 heard, RRR. No murmurs, rubs, gallops or clicks. Right DP palpable. Gastrointestinal system: Abdomen is nondistended, soft and nontender. No organomegaly or masses felt. Normal bowel sounds heard. Central nervous system: Alert and oriented. No focal neurological deficits. Extremities: No edema. No calf tenderness. Left foot with transmetatarsal amputation and gangrene located on stump with surrounding erythema. Wrist pain with flexion/extension without tenderness on palpation. No swelling. Skin: No cyanosis. Right 5th digit ulcer wrapped. Psychiatry: Judgement and insight appear normal. Mood & affect appropriate.     Data Reviewed: I have personally reviewed following labs and imaging studies  CBC:  Recent Labs Lab 01/08/17 0359 01/09/17 0801 01/10/17 0818 01/11/17 0216 01/12/17 0916  WBC 8.0 11.5* 10.2 9.9 10.5  NEUTROABS 6.1 9.3* 7.7 7.9*  --   HGB 8.6* 9.4* 9.5* 9.1* 8.4*  HCT 26.5* 28.6* 29.7* 28.2* 26.6*  MCV 94.6 93.5 94.0 94.9 96.0  PLT 410* 461* 471* 468* 437*   Basic  Metabolic Panel:  Recent Labs Lab 01/07/17 0948 01/08/17 0359 01/09/17 0801 01/10/17 0123 01/10/17 0818 01/11/17 0216 01/12/17 0916  NA 132* 131* 132* 130*  --  128* 130*  K 3.2* 3.3* 3.4* 3.4*  --  4.9 3.7  CL 93* 92* 95* 95*  --  90* 92*  CO2 --  25 25  GLUCOSE 218* 344* 167* 188*  --  273* 335*  BUN 53* 53* 46* 50*  --  47* 46*  CREATININE 6.31* 6.47* 6.22* 6.36*  --  6.14* 7.00*  CALCIUM 8.2* 8.1* 8.1* 8.0*  --  8.1* 7.9*  MG 1.6* 1.5* 1.6*  --  1.8 2.0  --   PHOS 3.8 3.6 3.1 4.0 4.0 5.0*  --    GFR: Estimated Creatinine Clearance: 10.2 mL/min (A) (by C-G formula based on SCr of 7 mg/dL (H)). Liver Function Tests:  Recent Labs Lab 01/07/17 1610 01/08/17 0359 01/09/17 0801 01/10/17 0123 01/11/17 0216 01/12/17 0916  AST 41 79* 269*  --  566* 437*  ALT 28 33 53  --  105* 52  ALKPHOS 60 63 67  --  67 60  BILITOT 0.4 0.4 0.2*  --  2.2* 0.3  PROT 5.6* 5.9* 5.9*  --  5.6* 5.6*  ALBUMIN 1.3* 1.3* 1.4* 1.4* 1.4* 1.3*   No results for input(s): LIPASE, AMYLASE in the last 168 hours. No results for input(s): AMMONIA in the last 168 hours. Coagulation Profile:  Recent Labs Lab 01/09/17 1424  INR 1.20   Cardiac Enzymes:  Recent Labs Lab 01/07/17 0948 01/08/17 1056 01/08/17 1604 01/08/17 2227  CKTOTAL 52  --   --   --   TROPONINI  --  0.27* 0.51* 1.73*   BNP (last 3 results) No results for input(s): PROBNP in the last 8760 hours. HbA1C: No results for input(s): HGBA1C in the last 72 hours. CBG:  Recent Labs Lab 01/11/17 1210 01/11/17 1618 01/11/17 2108 01/12/17 0743 01/12/17 1255  GLUCAP 139* 96 95 350* 308*   Lipid Profile: No results for input(s): CHOL, HDL, LDLCALC, TRIG, CHOLHDL, LDLDIRECT in the last 72 hours. Thyroid Function Tests: No results for input(s): TSH, T4TOTAL, FREET4, T3FREE, THYROIDAB in the last 72 hours. Anemia Panel: No results for input(s): VITAMINB12, FOLATE, FERRITIN, TIBC, IRON, RETICCTPCT in the last 72  hours. Sepsis Labs: No results for input(s): PROCALCITON, LATICACIDVEN in the last 168 hours.  Recent Results (from the past 240 hour(s))  Culture, blood (routine x 2)     Status: None   Collection Time: 01/03/17 10:41 PM  Result Value Ref Range Status   Specimen Description BLOOD RIGHT HAND  Final   Special Requests   Final    BOTTLES DRAWN AEROBIC AND ANAEROBIC Blood Culture adequate volume   Culture NO GROWTH 5 DAYS  Final   Report Status 01/09/2017 FINAL  Final  Culture, blood (routine x 2)     Status: None   Collection Time: 01/03/17 10:41 PM  Result Value Ref Range Status   Specimen Description BLOOD RIGHT ANTECUBITAL  Final   Special Requests   Final    BOTTLES DRAWN AEROBIC AND ANAEROBIC Blood Culture adequate volume   Culture NO GROWTH 5 DAYS  Final   Report Status 01/09/2017 FINAL  Final  MRSA PCR Screening     Status: None   Collection Time: 01/08/17  4:15 PM  Result Value Ref Range Status   MRSA by PCR NEGATIVE NEGATIVE Final    Comment:        The GeneXpert MRSA Assay (FDA approved for NASAL specimens only), is one component of a comprehensive MRSA colonization surveillance program. It is not intended to diagnose MRSA infection nor to guide or monitor treatment for MRSA infections.   Surgical pcr screen     Status: None   Collection Time: 01/11/17  4:00 AM  Result Value Ref Range Status   MRSA, PCR NEGATIVE NEGATIVE Final   Staphylococcus aureus NEGATIVE NEGATIVE Final    Comment: (NOTE) The Xpert SA Assay (FDA approved for NASAL specimens in patients 72 years of age and older), is one component of a comprehensive surveillance program. It is not intended to diagnose infection nor to guide or monitor treatment.          Radiology Studies: Dg Chest 2 View  Result Date: 01/12/2017 CLINICAL DATA:  CHF EXAM: CHEST  2 VIEW COMPARISON:  01/03/2017 chest radiograph. FINDINGS: Intact sternotomy wires. Subcutaneous single lead left chest ICD is stable with  lead tip overlying the medial left upper chest. Stable cardiomediastinal silhouette with mild cardiomegaly and aortic atherosclerosis. No pneumothorax. Trace  bilateral pleural effusions. Cephalization of the pulmonary vasculature, with no overt pulmonary edema. Mild left basilar atelectasis. IMPRESSION: 1. Stable cardiomegaly without overt pulmonary edema. 2. Trace bilateral pleural effusions and mild left basilar atelectasis. Electronically Signed   By: Delbert Phenix M.D.   On: 01/12/2017 10:12   US Abdomen Limited Ruq  Result Date: 01/11/2017 CLINICAL DATA:  Elevated LFTs. EXAM: ULTRASOUND ABDOMEN LIMITED RIGHT UPPER QUADRANT COMPARISON:  12/19/2016 FINDINGS: Gallbladder: Evidence of sludge versus nonshadowing stones over the dependent portion of the gallbladder/neck. Largest measures 7 mm. No wall thickening or adjacent free fluid. Negative sonographic Murphy sign. Common bile duct: Diameter: 4.2 mm. Liver: No focal lesion identified. Within normal limits in parenchymal echogenicity. Portal vein is patent on color Doppler imaging with normal direction of blood flow towards the liver. IMPRESSION: Findings suggesting mild sludge versus small nonshadowing stones over the dependent portion of the gallbladder. No additional sonographic evidence to suggest cholecystitis. Electronically Signed   By: Elberta Fortis M.D.   On: 01/11/2017 21:50        Scheduled Meds: . aspirin  81 mg Oral Daily  . calcium acetate  667 mg Oral TID WC  . carvedilol  12.5 mg Oral QHS  . clopidogrel  75 mg Oral Q breakfast  . darbepoetin (ARANESP) injection - NON-DIALYSIS  200 mcg Subcutaneous Q Tue-1800  . diclofenac sodium  2 g Topical QID  . docusate sodium  100 mg Oral BID  . feeding supplement (NEPRO CARB STEADY)  237 mL Oral BID BM  . gentamicin cream  1 application Topical Daily  . gentamicin cream   Topical BID  . heparin subcutaneous  5,000 Units Subcutaneous Q8H  . insulin aspart  0-9 Units Subcutaneous TID WC    . insulin NPH Human  40 Units Subcutaneous QAC breakfast  . insulin NPH Human  45 Units Subcutaneous QHS  . midodrine  5 mg Oral BID WC  . multivitamin  1 tablet Oral QHS  . senna  1 tablet Oral BID  . sodium chloride flush  3 mL Intravenous Q12H  . sodium chloride flush  3 mL Intravenous Q12H  . Vitamin D (Ergocalciferol)  50,000 Units Oral Q30 days   Continuous Infusions: . sodium chloride 250 mL (01/08/17 2000)  . sodium chloride    . sodium chloride 10 mL/hr at 01/11/17 1005  . sodium chloride 10 mL/hr at 01/11/17 1500  . dialysis solution 2.5% low-MG/low-CA    . methocarbamol (ROBAXIN)  IV       LOS: 6 days     Jacquelin Hawking, MD [facility] 01/12/2017, 1:27 PM Pager: [phone]  If 7PM-7AM, please contact night-coverage [email or web address] Password [email or web address] 01/12/2017, 1:27 PM

## 2017-01-12 NOTE — Care Management Note (Signed)
Case Management Note  Patient Details  Name: [name] MRN: [id number] Date of Birth: December 30, 1940  Subjective/Objective:     Pt admitted from rehab with progressive foot infection - s/p transmetatarsal amputation              Action/Plan:   PTA pt was in CIR - prior to that he was from home independent with wife on peritoneal dialysis.  CIR following with plans to readmit pt when medically stable. Per pt -  Pt has BKA amputation surgery planned for 10/3.  CM will continue to follow for discharge needs   Expected Discharge Date:  01/10/17               Expected Discharge Plan:  IP Rehab Facility  In-House Referral:  Clinical Social Work  Discharge planning Services  CM Consult  Post Acute Care Choice:    Choice offered to:     DME Arranged:    DME Agency:     HH Arranged:    HH Agency:     Status of Service:     If discussed at [employer], dates discussed:    Additional Comments: S/p BKA.  CIR and CSW following  Cherylann Parr, RN 01/12/2017, 9:46 AM

## 2017-01-12 NOTE — Evaluation (Signed)
Physical Therapy Evaluation Patient Details Name: [name] MRN: [id number] DOB: [birth year] Today's Date: 01/12/2017   History of Present Illness  Joshua Trombetta Comptonis a 76 y.o.malewith hx of HTN, CVA, ESRD on PD, DM on insulin, COPD and CHFw AICD, hx CAD/ CABG 2011. Pt was admitted on 12/15/16 for non-healing L foot wound and R groin pseudoaneurysm, underwent repair of PA R groin and also resection of left 3rd metatarsal head by Dr Randie Heinz. However, the left foot progressed and developed gangrenous changes, so on 9/14 pt went for L transmetatarsal amputation by Dr Lajoyce Corners. pt admitted to rehab on 12/27/16.   Past Medical History:  Diagnosis Date  . AICD (automatic cardioverter/defibrillator) present   . CHF (congestive heart failure) (HCC)   . COPD (chronic obstructive pulmonary disease) (HCC)   . Coronary artery disease    CABG 2011  . Diabetes mellitus without complication (HCC)   . ESRD on peritoneal dialysis [facility])    Started PD Dec 2016 in [place]. Nephrology is in [place].   [name] History of peritonitis    PD cath related peritonitis in April 2017  . Hypertension   . Peripheral vascular disease (HCC)   . Sjogren's syndrome (HCC)    Per pt's wife, was diagnosed in 2016 during w/u for cause of renal failure prior to starting dialysis.  He had a rash on his chest apparently prompting this w/u.  Never had renal bx.  He was referred to a rheum MD in [place] and per the wife he was treated with MTX and other medications but had side effects to "all of it" and isn't taking anything for this as of Jun 2017.   . Stroke [facility])     Clinical Impression  Pt admitted with above diagnosis. Pt currently with functional limitations due to the deficits listed below (see PT Problem List). Pt was able to stand pivot to chair with max assist of 2 persons.  Pt will need return to Rehab and then home with wife.  Will follow acutely. Pt will benefit from skilled PT to increase their independence and  safety with mobility to allow discharge to the venue listed below.      Follow Up Recommendations CIR    Equipment Recommendations  Wheelchair (measurements PT);Wheelchair cushion (measurements PT) (w/c with elevating leg rests, possibly sliding board)    Recommendations for Other Services Rehab consult     Precautions / Restrictions Precautions Precautions: Fall Precaution Comments: PD port, VAC left residual limb Restrictions Weight Bearing Restrictions: No LLE Weight Bearing: Non weight bearing      Mobility  Bed Mobility Overal bed mobility: Needs Assistance Bed Mobility: Supine to Sit     Supine to sit: Total assist;+2 for physical assistance     General bed mobility comments: Pt requires assist for all aspects.  He is very slow to initiate activity/movement   Transfers Overall transfer level: Needs assistance Equipment used: 2 person hand held assist Transfers: Sit to/from [employer] Sit to Stand: Max assist;+2 physical assistance Stand pivot transfers: Max assist;+2 physical assistance       General transfer comment: assist for forward translation of trunk and assist to lift trunk.  Assist for balance and to pivot   Ambulation/Gait                Stairs            Wheelchair Mobility    Modified Rankin (Stroke Patients Only)       Balance  Overall balance assessment: Needs assistance Sitting-balance support: Feet supported Sitting balance-Leahy Scale: Poor Sitting balance - Comments: Pt initially sitting EOB with max A, but progressed to close min guard assist EOB  Postural control: Posterior lean Standing balance support: Bilateral upper extremity supported Standing balance-Leahy Scale: Poor Standing balance comment: requires max A +2.  could not achieve full upright with pt flexed trunk and right knee.                              Pertinent Vitals/Pain Pain Assessment: Faces Faces Pain Scale: Hurts  little more Pain Location: Lt LE with transfers  Pain Descriptors / Indicators: Operative site guarding;Grimacing Pain Intervention(s): Limited activity within patient's tolerance;Monitored during session;Premedicated before session;Repositioned    Home Living Family/patient expects to be discharged to:: Inpatient rehab Living Arrangements: Spouse/significant other Available Help at Discharge: Family;Available 24 hours/day Type of Home: Mobile home Home Access: Stairs to enter Entrance Stairs-Rails: Left Entrance Stairs-Number of Steps: 1 Home Layout: One level Home Equipment: Walker - 4 wheels;Cane - quad;Wheelchair - manual      Prior Function Level of Independence: Needs assistance   Gait / Transfers Assistance Needed: uses [name], RW  ADL's / Homemaking Assistance Needed: A for socks, can bath self in shower  Comments: reports hobbling around the house, using rollator as able. Legs would "give out" at times but denies having any falls.      Hand Dominance   Dominant Hand: Right    Extremity/Trunk Assessment   Upper Extremity Assessment Upper Extremity Assessment: Defer to OT evaluation    Lower Extremity Assessment Lower Extremity Assessment: Defer to PT evaluation LLE Deficits / Details:  knee and hip strength grossly 3-/5    Cervical / Trunk Assessment Cervical / Trunk Assessment: Kyphotic  Communication   Communication: No difficulties  Cognition Arousal/Alertness: Awake/alert Behavior During Therapy: Flat affect Overall Cognitive Status: Impaired/Different from baseline Area of Impairment: Attention;Following commands;Problem solving                   Current Attention Level: Sustained;Focused   Following Commands: Follows one step commands inconsistently;Follows one step commands with increased time     Problem Solving: Slow processing;Decreased initiation;Difficulty sequencing;Requires verbal cues;Requires tactile cues General Comments: Pt  requires mod - max cues for motor planning.  Easily distracts.  Follow one step commands intermittently.  ? if cognitive deficits may be med related.  RN aware       General Comments General comments (skin integrity, edema, etc.): wife and RN present.  BP 149/58 suine.  125/71 sitting, 146/67 after transfer to chair.  Denied dizziess.  Other VSS.     Exercises General Exercises - Lower Extremity Ankle Circles/Pumps: AROM;Right;5 reps;Supine Quad Sets: AROM;Both;10 reps;Supine Long Arc Quad: AROM;Both;10 reps;Seated Straight Leg Raises: AROM;Both;10 reps;Seated Hip Flexion/Marching: AROM;Right;10 reps;Seated   Assessment/Plan    PT Assessment Patient needs continued PT services  PT Problem List Decreased range of motion;Decreased strength;Decreased mobility;Cardiopulmonary status limiting activity;Pain;Decreased cognition;Decreased knowledge of use of DME;Decreased safety awareness;Decreased knowledge of precautions;Decreased balance;Decreased activity tolerance       PT Treatment Interventions DME instruction;Gait training;Stair training;Functional mobility training;Therapeutic activities;Therapeutic exercise;Balance training;Wheelchair mobility training;Patient/family education    PT Goals (Current goals can be found in the Care Plan section)  Acute Rehab PT Goals Patient Stated Goal: To rehab then home  PT Goal Formulation: With patient Time For Goal Achievement: 01/26/17 Potential to Achieve Goals: Good  Frequency Min 3X/week   Barriers to discharge        Co-evaluation PT/OT/SLP Co-Evaluation/Treatment: Yes Reason for Co-Treatment: For patient/therapist safety PT goals addressed during session: Mobility/safety with mobility OT goals addressed during session: ADL's and self-care       AM-PAC PT "6 Clicks" Daily Activity  Outcome Measure Difficulty turning over in bed (including adjusting bedclothes, sheets and blankets)?: Unable Difficulty moving from lying on back  to sitting on the side of the bed? : Unable Difficulty sitting down on and standing up from a chair with arms (e.g., wheelchair, bedside commode, etc,.)?: Unable Help needed moving to and from a bed to chair (including a wheelchair)?: Total Help needed walking in hospital room?: Total Help needed climbing 3-5 steps with a railing? : Total 6 Click Score: 6    End of Session Equipment Utilized During Treatment: Gait belt Activity Tolerance: Patient limited by fatigue Patient left: with call bell/phone within reach;with family/visitor present;in chair Nurse Communication: Mobility status;Need for lift equipment PT Visit Diagnosis: Other abnormalities of gait and mobility (R26.89);Muscle weakness (generalized) (M62.81);Difficulty in walking, not elsewhere classified (R26.2) Pain - Right/Left: Left Pain - part of body: Knee    Time: 8416-6063 PT Time Calculation (min) (ACUTE ONLY): 24 min   Charges:   PT Evaluation $PT Eval Moderate Complexity: 1 Mod     PT G Codes:        Anuhea Gassner,PT Acute Rehabilitation [phone] (pager)   Berline Lopes 01/12/2017, 1:43 PM

## 2017-01-12 NOTE — Consult Note (Addendum)
WOC Nurse wound follow-up consult note Dr Lajoyce Corners following for assessment and plan of care to LLE Reason for Consult: Reassessment of nose and right ear.  Pt has been wearing Bipap and developed medical-device related pressure injuries. Wound type: Bridge of nose has decreased in size; .8X.8X.1cm, 50% dark scabbed area and 50% pink and moist stage 2 pressure injury, no odor or drainage. Right upper ear with previous dark red deep tissue injury has evolved into stage 2 pressure injury; .3X.2X.1cm, red and moist, no odor, small amt pink drainage. Right 5th toe with dry scabbed wound; .8X.5cm, no odor, drainage, or fluctuance. Pressure Injury POA: No Dressing procedure/placement/frequency: Discussed plan of care with patient and wife at the bedside. Gentamycin ointment has already been ordered to promote moist healing, can be left open to air during the day to promote healing to bridge of nose, right toe, and right ear.  Foam dressing to protect and add padding to ear and nose when patient is wearing Bipap mask at night.  Instructions provided for staff nurses and wife. Please re-consult if further assistance is needed.  Thank-you,  Cammie Mcgee MSN, RN, CWOCN, [place], CNS [phone]

## 2017-01-12 NOTE — Progress Notes (Signed)
Pharmacy Antibiotic Note [name] is a 76 y.o. 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   Plan:  1. Continue Daptomycin  IV q48h will get CK on 10/5 2. Continue Fortaz 500 mg IV q 24 hours  3. Need to determine antibiotic length of therapy post op    Height:  (175.3 cm) Weight: 200 lb 9.9 oz (91 kg) IBW/kg (Calculated) : 70.7  Temp (24hrs), Avg:98.3 F (36.8 C), Min:97.7 F (36.5 C), Max:98.9 F (37.2 C)   Recent Labs Lab 01/06/17 1915 01/07/17 0948 01/08/17 0359 01/09/17 0801 01/10/17 0123 01/10/17 0818 01/11/17 0216  WBC 13.3*  --  8.0 11.5*  --  10.2 9.9  CREATININE 6.87* 6.31* 6.47* 6.22* 6.36*  --  6.14*    Estimated Creatinine Clearance: 11.6 mL/min (A) (by C-G formula based on SCr of 6.14 mg/dL (H)).    Allergies  Allergen Reactions  . Lisinopril Hives  . Methotrexate Derivatives Other (See Comments)    blisters  . Sulfa Antibiotics Hives  . Tape Hives  . Vancomycin Other (See Comments)    Blisters   . Zanaflex [Tizanidine Hcl]     Hypotensive stroke    Antibiotics:  Ceftazidime  9/25>> Daptomycin 9/28>>   Culture data:  9/30: MRSA PCR: negative  9/25 BCx: NGx2 9/24 UCx: no growth  Pollyann Samples, PharmD, BCPS 01/12/2017, 7:48 AM

## 2017-01-12 NOTE — Clinical Social Work Note (Signed)
CSW acknowledges SNF consult. Patient unable to go to SNF on PD. CIR following for possible readmission.  CSW signing off. Consult again if any other social work needs arise.  Charlynn Court, CSW [phone]

## 2017-01-12 NOTE — Progress Notes (Signed)
Progress Note  Patient Name: [name] Date of Encounter: 01/12/2017  Primary Cardiologist  Dr. Teofilo Pod ([facility]) and Dr. Wilford Grist ([facility])  Subjective   No recurrent chest pain. Intermittent dyspnea.   Inpatient Medications    Scheduled Meds: . aspirin  81 mg Oral Daily  . calcium acetate  667 mg Oral TID WC  . carvedilol  12.5 mg Oral QHS  . clopidogrel  75 mg Oral Q breakfast  . darbepoetin (ARANESP) injection - NON-DIALYSIS  200 mcg Subcutaneous Q Tue-1800  . diclofenac sodium  2 g Topical QID  . docusate sodium  100 mg Oral BID  . feeding supplement (NEPRO CARB STEADY)  237 mL Oral BID BM  . gentamicin cream  1 application Topical Daily  . gentamicin cream   Topical BID  . heparin subcutaneous  5,000 Units Subcutaneous Q8H  . insulin aspart  0-9 Units Subcutaneous TID WC  . insulin NPH Human  40 Units Subcutaneous QAC breakfast  . insulin NPH Human  45 Units Subcutaneous QHS  . midodrine  5 mg Oral BID WC  . multivitamin  1 tablet Oral QHS  . senna  1 tablet Oral BID  . sodium chloride flush  3 mL Intravenous Q12H  . sodium chloride flush  3 mL Intravenous Q12H  . Vitamin D (Ergocalciferol)  50,000 Units Oral Q30 days   Continuous Infusions: . sodium chloride 250 mL (01/08/17 2000)  . sodium chloride    . sodium chloride 10 mL/hr at 01/11/17 1005  . sodium chloride 10 mL/hr at 01/11/17 1500  . cefTAZidime (FORTAZ)  IV Stopped (01/11/17 2220)  . DAPTOmycin (CUBICIN)  IV Stopped (01/10/17 2141)  . dialysis solution 2.5% low-MG/low-CA    . methocarbamol (ROBAXIN)  IV     PRN Meds: sodium chloride, sodium chloride, acetaminophen **OR** acetaminophen, bisacodyl, dianeal solution for CAPD/CCPD with heparin, hydrALAZINE, HYDROmorphone (DILAUDID) injection, magnesium citrate, methocarbamol **OR** methocarbamol (ROBAXIN)  IV, metoCLOPramide **OR** metoCLOPramide (REGLAN) injection, morphine injection, nitroGLYCERIN, ondansetron **OR** ondansetron (ZOFRAN) IV, oxyCODONE,  oxyCODONE-acetaminophen, polyethylene glycol, sodium chloride flush, sodium chloride flush   Vital Signs    Vitals:   01/12/17 0300 01/12/17 0400 01/12/17 0500 01/12/17 0828  BP: (!) 101/44 (!) 105/40 (!) 102/48 (!) 117/59  Pulse: 80 79 82 85  Resp: Temp: 98.9 F (37.2 C)   97.8 F (36.6 C)  TempSrc: Axillary   Oral  SpO2: 94% 96% 94% 96%  Weight:      Height:        Intake/Output Summary (Last 24 hours) at 01/12/17 1039 Last data filed at 01/12/17 0930  Gross per 24 hour  Intake         13783.17 ml  Output            13387 ml  Net           396.17 ml   Filed Weights   01/10/17 0835 01/10/17 1655 01/11/17 0500  Weight: 202 lb 13.2 oz (92 kg) 203 lb 4.2 oz (92.2 kg) 200 lb 9.9 oz (91 kg)    Telemetry    SR - Personally Reviewed  ECG    N/A - Personally Reviewed  Physical Exam   GEN: No acute distress.   Neck: No JVD Cardiac: RRR, no murmurs, rubs, or gallops.  Respiratory: Clear to auscultation bilaterally. 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 LUE AVF Neuro:  Nonfocal  Psych:  Normal affect   Labs    Chemistry Recent Labs Lab 01/08/17 0359 01/09/17 0801 01/10/17 0123 01/11/17 0216  NA 131* 132* 130* 128*  K 3.3* 3.4* 3.4* 4.9  CL 92* 95* 95* 90*  CO2 GLUCOSE 344* 167* 188* 273*  BUN 53* 46* 50* 47*  CREATININE 6.47* 6.22* 6.36* 6.14*  CALCIUM 8.1* 8.1* 8.0* 8.1*  PROT 5.9* 5.9*  --  5.6*  ALBUMIN 1.3* 1.4* 1.4* 1.4*  AST 79* 269*  --  566*  ALT 33 53  --  105*  ALKPHOS 63 67  --  67  BILITOT 0.4 0.2*  --  2.2*  GFRNONAA 7* 8* 8* 8*  GFRAA 9* 9* 9* 9*  ANIONGAP Hematology Recent Labs Lab 01/10/17 0818 01/11/17 0216 01/12/17 0916  WBC 10.2 9.9 10.5  RBC 3.16* 2.97* 2.77*  HGB 9.5* 9.1* 8.4*  HCT 29.7* 28.2* 26.6*  MCV 94.0 94.9 96.0  MCH 30.1 30.6 30.3  MCHC 32.0 32.3 31.6  RDW 15.1 15.5 15.3  PLT 471* 468* 437*    Cardiac  Enzymes Recent Labs Lab 01/08/17 1056 01/08/17 1604 01/08/17 2227  TROPONINI 0.27* 0.51* 1.73*   No results for input(s): TROPIPOC in the last 168 hours.   BNPNo results for input(s): BNP, PROBNP in the last 168 hours.   DDimer No results for input(s): DDIMER in the last 168 hours.   Radiology    Dg Chest 2 View  Result Date: 01/12/2017 CLINICAL DATA:  CHF EXAM: CHEST  2 VIEW COMPARISON:  01/03/2017 chest radiograph. FINDINGS: Intact sternotomy wires. Subcutaneous single lead left chest ICD is stable with lead tip overlying the medial left upper chest. Stable cardiomediastinal silhouette with mild cardiomegaly and aortic atherosclerosis. No pneumothorax. Trace bilateral pleural effusions. Cephalization of the pulmonary vasculature, with no overt pulmonary edema. Mild left basilar atelectasis. IMPRESSION: 1. Stable cardiomegaly without overt pulmonary edema. 2. Trace bilateral pleural effusions and mild left basilar atelectasis. Electronically Signed   By: Delbert Phenix M.D.   On: 01/12/2017 10:12   US Abdomen Limited Ruq  Result Date: 01/11/2017 CLINICAL DATA:  Elevated LFTs. EXAM: ULTRASOUND ABDOMEN LIMITED RIGHT UPPER QUADRANT COMPARISON:  12/19/2016 FINDINGS: Gallbladder: Evidence of sludge versus nonshadowing stones over the dependent portion of the gallbladder/neck. Largest measures 7 mm. No wall thickening or adjacent free fluid. Negative sonographic Murphy sign. Common bile duct: Diameter: 4.2 mm. Liver: No focal lesion identified. Within normal limits in parenchymal echogenicity. Portal vein is patent on color Doppler imaging with normal direction of blood flow towards the liver. IMPRESSION: Findings suggesting mild sludge versus small nonshadowing stones over the dependent portion of the gallbladder. No additional sonographic evidence to suggest cholecystitis. Electronically Signed   By: Elberta Fortis M.D.   On: 01/11/2017 21:50    Cardiac Studies   LEFT HEART CATH AND CORS/GRAFTS  ANGIOGRAPHY  Conclusion     Ost RCA to Prox RCA lesion, 0 %stenosed.  LM lesion, 100 %stenosed.  LIMA.  SVG.  Origin lesion, 100 %stenosed.  SVG.  Dist Graft to Insertion lesion, 40 %stenosed.  1st Mrg lesion, 75 %stenosed.  Ost Cx to Prox Cx lesion, 80 %stenosed.  Ost 3rd Mrg lesion, 100 %stenosed.  Ost 1st Mrg lesion, 100 %stenosed.  Mid Cx lesion, 60 %stenosed.  IMPRESSION: Mr. Heyward has a patent proximal RCA stent, a patent LIMA to the LAD. He has a patent sequential graft to 2 obtuse marginal branches. The  first anastomosis has about a 60- 75% stenosis at the anastomosis.. The vein graft crossing that obtuse marginal branch has about 50-60% stenosis. There are no critical stenoses or copper lesions identified. Continue medical therapy will be recommended. The sheath was removed and pressure held on the left groin to achieve hemostasis. The patient left the lab in stable condition Diagnostic Diagram        Patient Profile     CAYETANO MIKITA is a 76 y.o. male with past medical history of CAD (s/p CABG in 2011, stent to RCA in 07/2016 in the setting of an NSTEMI), chronic systolic CHF (EF 08% by echo in 10/2016), ischemic cardiomyopathy (s/p [employer] ICD placement in 11/2016), HTN, HLD, ESRD (on peritoneal dialysis), and PAD (s/p left peroneal artery revascularization and amputation of left 3rd metatarsal in 11/2016) seen for  NSTEMI. EKG with lateral ST depression.   Assessment & Plan    1. NSTEMI with hx of CAD s/p CABG & RCA stent 4/18 - Peak of troponin 1.73. Treated with IV heparin. Cath 01/09/17 as above. There are no critical stenoses or copper lesions. Continue medical therapy with ASA, Plavix, BB.   2. Chronic systolic CHF/ Ischemic Cardiomyopathy - EF 35% by echo in 10/2016 and recent placement of [employer] ICD in 11/2016. - Volume status  stable.   3. PAD - s/p left peroneal artery revascularization and amputation of left 3rd  metatarsal in 11/2016. Rrecent left transmetatarsal amputation on 9/14. 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  4. HLD - No results found for requested labs within last 8760 hours.  -Atorvastatin 80 mg po qHS held because of interaction with Dapatomycin. LDL goal less than 70. Consider resuming or add another agent.   6. HTN - BP stable on current regimen.   5. ESRD - on peritoneal dialysis. Nephrology following.   Dispo: follow up with primary cardiologist after discharge.   For questions or updates, please contact [facility] Please consult [email or web address] for contact info under Cardiology/STEMI.      Signed, Manson Passey, PA  01/12/2017, 10:39 AM    Patient examined chart reviewed SEM and basilar atelectasis post right transmetatarsal amputation Cardiac status Stable with no recurrent angina continue medical Rx  Charlton Haws

## 2017-01-12 NOTE — Progress Notes (Signed)
Patient ID: [name], male   DOB: 11-28-1940, 76 y.o.   MRN: [id number] Postoperative day 1 left transtibial amputation. The wound VAC has no drainage. Patient was given instructions to work on knee extension. He has no complaints at this time. Patient will continue with the wound VAC for 1 week and we will continue with the stump shrinker after the wound VAC is removed. Anticipate discharge to skilled nursing.

## 2017-01-12 NOTE — Progress Notes (Signed)
Rehab admissions - Patient known to me from previous CIR stay.  I received a request to screen for acute inpatient rehab today.  I will follow up with patient and family in am.  Likely will need to re-admit to CIR once he is medically stable.  Call me for questions.  #[phone]

## 2017-01-12 NOTE — Progress Notes (Addendum)
[facility] Rounding Note Subjective:    No PD issues (still on PD this AM) POD#1 L BKA Having some surgical non-phantom leg pain   Objective Vital signs in last 24 hours: Vitals:   01/12/17 0300 01/12/17 0400 01/12/17 0500 01/12/17 0828  BP: (!) 101/44 (!) 105/40 (!) 102/48 (!) 117/59  Pulse: 80 79 82 85  Resp: Temp: 98.9 F (37.2 C)   97.8 F (36.6 C)  TempSrc: Axillary   Oral  SpO2: 94% 96% 94% 96%  Weight:      Height:       Weight change:   Intake/Output Summary (Last 24 hours) at 01/12/17 0831 Last data filed at 01/12/17 1610  Gross per 24 hour  Intake          1290.17 ml  Output               75 ml  Net          1215.17 ml    Physical Exam: WNWD male, lying in bed - PD still ongoing Wife with him NAD Excoriations bridge of nose healing Regular S1S2 No S3 Lungs diminished at bases esp right Abdomen +BS, soft and not tender. PD catheter left abd with dry dressings and no abd tenderness Minimal LE edema today L BKA/wound VAC in place Small R 5th toe ulcer no gangrene Dialysis Access: PD cath, left upper arm AVF (+ bruit)   Recent Labs Lab 01/09/17 0801 01/10/17 0123 01/10/17 0818 01/11/17 0216  NA 132* 130*  --  128*  K 3.4* 3.4*  --  4.9  CL 95* 95*  --  90*  CO2 26 24  --  25  GLUCOSE 167* 188*  --  273*  BUN 46* 50*  --  47*  CREATININE 6.22* 6.36*  --  6.14*  CALCIUM 8.1* 8.0*  --  8.1*  PHOS 3.1 4.0 4.0 5.0*    Recent Labs Lab 01/08/17 0359 01/09/17 0801 01/10/17 0123 01/11/17 0216  AST 79* 269*  --  566*  ALT 33 53  --  105*  ALKPHOS 63 67  --  67  BILITOT 0.4 0.2*  --  2.2*  PROT 5.9* 5.9*  --  5.6*  ALBUMIN 1.3* 1.4* 1.4* 1.4*    Recent Labs Lab 01/06/17 1915  01/08/17 0359 01/09/17 0801 01/10/17 0818 01/11/17 0216  WBC 13.3*  --  8.0 11.5* 10.2 9.9  NEUTROABS  --   < > 6.1 9.3* 7.7 7.9*  HGB 9.4*  --  8.6* 9.4* 9.5* 9.1*  HCT 28.8*  --  26.5* 28.6* 29.7* 28.2*  MCV 94.7  --  94.6 93.5 94.0 94.9  PLT 425*   --  410* 461* 471* 468*  < > = values in this interval not displayed.  Recent Labs Lab 01/07/17 0948 01/08/17 1056 01/08/17 1604 01/08/17 2227  CKTOTAL 52  --   --   --   TROPONINI  --  0.27* 0.51* 1.73*     Recent Labs Lab 01/11/17 1002 01/11/17 1210 01/11/17 1618 01/11/17 2108 01/12/17 0743  GLUCAP 217* 139* 96 95 350*    Medications: Infusions: . sodium chloride 250 mL (01/08/17 2000)  . sodium chloride    . sodium chloride 10 mL/hr at 01/11/17 1005  . sodium chloride 10 mL/hr at 01/11/17 1500  . cefTAZidime (FORTAZ)  IV Stopped (01/11/17 2220)  . DAPTOmycin (CUBICIN)  IV Stopped (01/10/17 2141)  . dialysis solution 2.5% low-MG/low-CA    .  methocarbamol (ROBAXIN)  IV      Scheduled Medications: . aspirin  81 mg Oral Daily  . calcium acetate  667 mg Oral TID WC  . carvedilol  12.5 mg Oral QHS  . clopidogrel  75 mg Oral Q breakfast  . darbepoetin (ARANESP) injection - NON-DIALYSIS  200 mcg Subcutaneous Q Tue-1800  . diclofenac sodium  2 g Topical QID  . docusate sodium  100 mg Oral BID  . feeding supplement (NEPRO CARB STEADY)  237 mL Oral BID BM  . gentamicin cream  1 application Topical Daily  . gentamicin cream   Topical TID  . heparin subcutaneous  5,000 Units Subcutaneous Q8H  . insulin aspart  0-9 Units Subcutaneous TID WC  . insulin NPH Human  40 Units Subcutaneous QAC breakfast  . insulin NPH Human  45 Units Subcutaneous QHS  . midodrine  5 mg Oral BID WC  . multivitamin  1 tablet Oral QHS  . senna  1 tablet Oral BID  . sodium chloride flush  3 mL Intravenous Q12H  . sodium chloride flush  3 mL Intravenous Q12H  . Vitamin D (Ergocalciferol)  50,000 Units Oral Q30 days    Dialysis Orders:  Center: [facility]. 6 exchanges/day one mid-day exchange (occurs around 9am).  2.5 liters of 2.5% per CCPD 2 liters of 2.5% for last fill and mid day drain. Pt says EDW "about 202" Dr. Denny Peon  1. PAD 1. s/p open repair R common  femoral artery pseudoaneurysm 12/15/16 ([facility]) and L TMA 12/23/16 Lajoyce Corners).   2. [name] and dapto - need duration of ATB Rx post BKA 3. S/p left BKA 10/3 Dr. Lajoyce Corners 4. R small toe ulcer - local Rx 5. Mention made by Dr. Lajoyce Corners of possible SNF - please note that peritoneal dialysis cannot be done in SNF - inpatient Rehab would be the preferable route (to avoid need to transition to hemodialysis)  2. Chest pain/+ troponins.  1. Prior CABG. Stent to RCA 07/2016 d/t NSTEMI. ICD in place.  2. Recurrent CP/+ trops. Cath 10/1 no intervention required.   3. ESRD- CCPD, using 2.5% dextrose at home; Altered regimen for ease in hospital (no pause or day bag)  1. Usually all 2.5% at home, have adjusted here according to weight and BP 2. Used all 2.5% bags last night 3. Looks fairly euvolemic peripherally. Base crackles lung exam - get CXR to eval for edema 4. Weight still pending (will have new EDW post BKA)   4. Orthostatic hypotension - midodrine  5. Anemia (ABLA)- TSAT 45% and Ferritin 2284,  1. ^Aranesp 200 dosed 10/2.  2. No post op CBC - ordered  6. Hypokalemia: Monitor for need for replacement.   7. Myoclonus / Confusion: stopped gabapentin and robaxin. No further confusion  8. Secondary HPT - phoslo. Regular diet to promote nutrition  9. DM - per primary team  10. Abnormal transaminases - AST AND ALT elevated/rising and bili slow increase (so would seem leess likely cardiac related...) Per primary team if still rising today GI consultation.   Camille Bal, MD [facility] [phone] Pager 01/12/2017, 8:31 AM

## 2017-01-13 LAB — GLUCOSE, CAPILLARY
GLUCOSE-CAPILLARY: 367 mg/dL — AB (ref 65–99)
GLUCOSE-CAPILLARY: 296 mg/dL — AB (ref 65–99)
GLUCOSE-CAPILLARY: 182 mg/dL — AB (ref 65–99)
Glucose-Capillary: 274 mg/dL — ABNORMAL HIGH (ref 65–99)

## 2017-01-13 LAB — CBC
HCT: 25.1 % — ABNORMAL LOW (ref 39.0–52.0)
Hemoglobin: 7.9 g/dL — ABNORMAL LOW (ref 13.0–17.0)
MCH: 30 pg (ref 26.0–34.0)
MCHC: 31.5 g/dL (ref 30.0–36.0)
MCV: 95.4 fL (ref 78.0–100.0)
PLATELETS: 404 10*3/uL — AB (ref 150–400)
RBC: 2.63 MIL/uL — ABNORMAL LOW (ref 4.22–5.81)
RDW: 15.4 % (ref 11.5–15.5)
WBC: 8.1 10*3/uL (ref 4.0–10.5)

## 2017-01-13 LAB — COMPREHENSIVE METABOLIC PANEL
ALT: 19 U/L (ref 17–63)
ANION GAP: 14 (ref 5–15)
AST: 308 U/L — ABNORMAL HIGH (ref 15–41)
Albumin: 1.3 g/dL — ABNORMAL LOW (ref 3.5–5.0)
Alkaline Phosphatase: 61 U/L (ref 38–126)
BUN: 46 mg/dL — ABNORMAL HIGH (ref 6–20)
CHLORIDE: 91 mmol/L — AB (ref 101–111)
CO2: 25 mmol/L (ref 22–32)
Calcium: 7.9 mg/dL — ABNORMAL LOW (ref 8.9–10.3)
Creatinine, Ser: 6.94 mg/dL — ABNORMAL HIGH (ref 0.61–1.24)
GFR, EST AFRICAN AMERICAN: 8 mL/min — AB (ref >60)
GFR, EST NON AFRICAN AMERICAN: 7 mL/min — AB (ref >60)
Glucose, Bld: 360 mg/dL — ABNORMAL HIGH (ref 65–99)
Potassium: 3.7 mmol/L (ref 3.5–5.1)
SODIUM: 130 mmol/L — AB (ref 135–145)
Total Bilirubin: 0.5 mg/dL (ref 0.3–1.2)
Total Protein: 5.4 g/dL — ABNORMAL LOW (ref 6.5–8.1)

## 2017-01-13 LAB — PHOSPHORUS: PHOSPHORUS: 5.4 mg/dL — AB (ref 2.5–4.6)

## 2017-01-13 MED ORDER — DELFLEX-LC/4.25% DEXTROSE 483 MOSM/L IP SOLN
Freq: Every day | INTRAPERITONEAL | Status: DC
  Administered 2017-01-14: 5000 mL via INTRAPERITONEAL

## 2017-01-13 MED ORDER — PRO-STAT SUGAR FREE PO LIQD
30.0000 mL | Freq: Two times a day (BID) | ORAL | Status: DC
  Administered 2017-01-13 – 2017-01-17 (×8): 30 mL via ORAL
  Filled 2017-01-13 (×9): qty 30

## 2017-01-13 MED ORDER — DELFLEX-LC/2.5% DEXTROSE 394 MOSM/L IP SOLN: Freq: Every day | INTRAPERITONEAL | Status: DC

## 2017-01-13 MED ORDER — DELFLEX-LC/2.5% DEXTROSE 394 MOSM/L IP SOLN
Freq: Every day | INTRAPERITONEAL | Status: DC
  Administered 2017-01-14: 5000 mL via INTRAPERITONEAL

## 2017-01-13 MED ORDER — DELFLEX-LC/4.25% DEXTROSE 483 MOSM/L IP SOLN: Freq: Every day | INTRAPERITONEAL | Status: DC

## 2017-01-13 NOTE — Care Management Note (Signed)
Case Management Note  Patient Details  Name: RADLEY BARTO MRN: [id number] Date of Birth: Apr 29, 1940  Subjective/Objective:     Pt admitted from rehab with progressive foot infection - s/p transmetatarsal amputation              Action/Plan:   PTA pt was in CIR - prior to that he was from home independent with wife on peritoneal dialysis.  CIR following with plans to readmit pt when medically stable. Per pt -  Pt has BKA amputation surgery planned for 10/3.  CM will continue to follow for discharge needs   Expected Discharge Date:  01/10/17               Expected Discharge Plan:  IP Rehab Facility  In-House Referral:  Clinical Social Work  Discharge planning Services  CM Consult  Post Acute Care Choice:    Choice offered to:     DME Arranged:    DME Agency:     HH Arranged:    HH Agency:     Status of Service:     If discussed at [employer], dates discussed:    Additional Comments: S/p BKA.  CIR is currently the only option for discharge as neither SNF nor LTACH accept PD HD pts. Cherylann Parr, RN 01/13/2017, 3:10 PM

## 2017-01-13 NOTE — Progress Notes (Signed)
Physical Therapy Treatment Patient Details Name: [name] MRN: [id number] DOB: 09-Jun-1940 Today's Date: 01/13/2017    History of Present Illness Joshua Gudiel Comptonis a 76 y.o.malewith hx of HTN, CVA, ESRD on PD, DM on insulin, COPD and CHFw AICD, hx CAD/ CABG 2011. Pt was admitted on 12/15/16 for non-healing L foot wound and R groin pseudoaneurysm, underwent repair of PA R groin and also resection of left 3rd metatarsal head by Dr [name]. 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 pt admitted to rehab on 12/27/16.     PT Comments    Pt admitted with above diagnosis. Pt currently with functional limitations due to balance and endurance deficits. Pt was able to use Stedy with max assist to get to chair.  Also exercises with PT.  Continues to be limited by decr endurance but does participate.  Will follow acutely.  Pt will benefit from skilled PT to increase their independence and safety with mobility to allow discharge to the venue listed below.     Follow Up Recommendations  CIR     Equipment Recommendations  Wheelchair (measurements PT);Wheelchair cushion (measurements PT) (w/c with elevating leg rests, possibly sliding board)    Recommendations for Other Services Rehab consult     Precautions / Restrictions Precautions Precautions: Fall Precaution Comments: PD port, VAC left residual limb Restrictions Weight Bearing Restrictions: Yes LLE Weight Bearing: Non weight bearing    Mobility  Bed Mobility Overal bed mobility: Needs Assistance Bed Mobility: Supine to Sit     Supine to sit: Total assist;+2 for physical assistance     General bed mobility comments: Pt requires assist for all aspects.  He is very slow to initiate activity/movement   Transfers Overall transfer level: Needs assistance Equipment used: Ambulation equipment used Transfers: Sit to/from [employer] Sit to Stand: Max assist;+2 physical assistance;Mod assist;From elevated surface Stand pivot transfers: +2 physical assistance;Total assist (sitting on Stedy)       General transfer comment: assist for forward translation of trunk and assist to lift trunk.  Assist for balance  Ambulation/Gait                 Stairs            Wheelchair Mobility    Modified Rankin (Stroke Patients Only)       Balance Overall balance assessment: Needs assistance Sitting-balance support: Feet supported;Bilateral upper extremity supported Sitting balance-Leahy Scale: Poor Sitting balance - Comments: Pt initially sitting EOB with max A, but progressed to close min guard assist EOB  Postural control: Posterior lean Standing balance support: Bilateral upper extremity supported Standing balance-Leahy Scale: Poor Standing balance comment: requires max A +2.  could not achieve full upright with pt flexed trunk and but did stand enough to get pads behind him in [place].                             Cognition Arousal/Alertness: Awake/alert Behavior During Therapy: Flat affect Overall Cognitive Status: Impaired/Different from baseline Area of Impairment: Attention;Following commands;Problem solving                   Current Attention Level: Sustained;Focused   Following Commands: Follows one step commands inconsistently;Follows one step commands with increased time     Problem Solving: Slow processing;Decreased initiation;Difficulty sequencing;Requires verbal cues;Requires tactile cues General Comments: Pt requires mod -  max cues for motor planning.  Easily distracts.  Follow one step commands intermittently.  ? if cognitive deficits may be med related.  RN aware       Exercises General Exercises - Lower Extremity Ankle Circles/Pumps: AROM;Right;5 reps;Supine Quad Sets: AROM;Both;10 reps;Supine Long Arc Quad: AROM;Both;10 reps;Seated Straight Leg Raises:  AROM;Both;10 reps;Seated    General Comments General comments (skin integrity, edema, etc.): BP 139/52 initially.  Sitting initially 87/74 but rebounded to 108/92 on departure.  96% on RA O2. HR stable.       Pertinent Vitals/Pain Pain Assessment: Faces Faces Pain Scale: Hurts little more Pain Location: Lt LE with transfers  Pain Descriptors / Indicators: Operative site guarding;Grimacing Pain Intervention(s): Limited activity within patient's tolerance;Monitored during session;Premedicated before session;Repositioned    Home Living                      Prior Function            PT Goals (current goals can now be found in the care plan section) Progress towards PT goals: Progressing toward goals    Frequency    Min 3X/week      PT Plan Current plan remains appropriate    Co-evaluation              AM-PAC PT "6 Clicks" Daily Activity  Outcome Measure  Difficulty turning over in bed (including adjusting bedclothes, sheets and blankets)?: Unable Difficulty moving from lying on back to sitting on the side of the bed? : Unable Difficulty sitting down on and standing up from a chair with arms (e.g., wheelchair, bedside commode, etc,.)?: Unable Help needed moving to and from a bed to chair (including a wheelchair)?: Total Help needed walking in hospital room?: Total Help needed climbing 3-5 steps with a railing? : Total 6 Click Score: 6    End of Session Equipment Utilized During Treatment: Gait belt Activity Tolerance: Patient limited by fatigue Patient left: with call bell/phone within reach;with family/visitor present;in chair;with chair alarm set Nurse Communication: Mobility status;Need for lift equipment PT Visit Diagnosis: Other abnormalities of gait and mobility (R26.89);Muscle weakness (generalized) (M62.81);Difficulty in walking, not elsewhere classified (R26.2) Pain - Right/Left: Left Pain - part of body: Knee     Time: 1046-1110 PT Time  Calculation (min) (ACUTE ONLY): 24 min  Charges:  $Therapeutic Exercise: 8-22 mins $Therapeutic Activity: 8-22 mins                    G Codes:       [name],PT Acute Rehabilitation [phone] (pager)    [name] 01/13/2017, 12:42 PM

## 2017-01-13 NOTE — Progress Notes (Signed)
PROGRESS NOTE    [name]  NFA:[id number] DOB: 1941/02/14 DOA: 01/06/2017 PCP: Dorice [name], MD   Brief Narrative: [name] is a 76 y.o. malewith hx of HTN, CVA, ESRD on PD, DM on insulin, COPD and CHFw AICD, hx CAD/ CABG 2011. Pt was admitted on 12/15/16 for non-healing L foot wound and R groin pseudoaneurysm, underwent repair of PA R groin and also resection of left 3rd metatarsal head by Dr Randie Heinz. However, the left foot progressed and developed gangrenous changes, so on 9/14 pt went for L transmetatarsal amputation by Dr Lajoyce Corners. Post op abx were stopped and pt admitted to rehab on 12/27/16.   On 9/23 pt had fevers and persistent ^WBC and tachycardia. Started on [place] due to concerns of possible infection. UcX and Blood cx's were negative. CXR was negative. Pt had increasing L foot pain w/ some skin changes/ blistering and serous drainage from L TMA wound. Dr Lajoyce Corners was consulted and per the chart, possible plan is for surgical intervention next week. Discussed with Dr. Lajoyce Corners and will see the patient Monday. C/w Abx for now.   Patient developed Chest Pain on 9/30 so he was given full dose ASA, SL NTG, and a Stat EKG and Troponin was ordered. EKG reviewed and showed significant ST Depressions (agreed by Cardiologist Dr. Donnie Aho) so Cardiology was consulted and patient was placed on a NTG gtt and Heparin gtt and transferred to SDU. Patient underwent catheterization and required no stenting and there was no critical stenosis identified. Cardiology cleared the patient and plan is for Left BKA in AM.    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 Wound vac in place. Antibiotics discontinued -orthopedic surgery recommendations: wound vac x1 week, staples out x2 weeks  Elevated AST/ALT Asymptomatic. Recent ultrasound significant for gallbladder polyp. RUQ ultrasound on 10/3 significant for sludge and shadow. Continues to trend down  Acute chest pain NSTEMI S/p cath without critical stenosis. Cardiology recommended okay for surgery. Nitroglycerin and heparin drip discontinued. -continue Plavix and Aspirin  ESRD on PD -nephrology recommendations  History of CVA -continue aspirin and Plavix -holding atorvastatin with elevated transaminases  PVD S/p left peroneal artery revascularization. -continue aspirin and Plavix  Anemia of CKD Stable  Hypotension -continue Midodrine  CAD Hx of CABG ICD -continue carvedilol, aspirin and Plavix -isosorbide mononitrate discontinued by cardiology -atorvastatin on hold secondary to elevated transaminases. Restart when improved.  Hypokalemia Hypomagensemia -Replete as needed  COPD No exacerbation. Stable.  Chronic systolic CHF No exacerbation. ICD in place. Last EF of 45% -continue strict I/O, daily weights  Hyponatremia Chronic. Stable.   DVT prophylaxis: subq heparin Code Status: Full code Family Communication: Wife at bedside Disposition Plan: Pending improvement of liver function   Consultants:   Orthopedic surgery  Nephrology  Cardiology  Procedures:  CARDIAC CATHETERIZATION  Ost RCA to Prox RCA lesion, 0 %stenosed.  LM lesion, 100 %stenosed.  LIMA.  SVG.  Origin  lesion, 100 %stenosed.  SVG.  Dist Graft to Insertion lesion, 40 %stenosed.  1st Mrg lesion, 75 %stenosed.  Ost Cx to Prox Cx lesion, 80 %stenosed.  Ost 3rd Mrg  lesion, 100 %stenosed.  Ost 1st Mrg lesion, 100 %stenosed.  Mid Cx lesion, 60 %stenosed.  Antimicrobials:  Ceftazidime  Daptomycin     Subjective: Pain managed with oxycodone  Objective: Vitals:   01/13/17 0600 01/13/17 0732 01/13/17 0800 01/13/17 0845  BP:  (!) 139/43 (!) 139/52 (!) 139/52  Pulse:   88 90  Resp:   18 19  Temp:  97.7 F (36.5 C)  (!) 97.4 F (36.3 C)  TempSrc:  Oral  Oral  SpO2:   98% 99%  Weight: 90.3 kg (199 lb 1.2 oz)   90.3 kg (199 lb 1.2 oz)  Height:     (1.753 m)    Intake/Output Summary (Last 24 hours) at 01/13/17 1225 Last data filed at 01/13/17 1100  Gross per 24 hour  Intake          13285.5 ml  Output            13582 ml  Net           -296.5 ml   Filed Weights   01/11/17 0500 01/13/17 0600 01/13/17 0845  Weight: 91 kg (200 lb 9.9 oz) 90.3 kg (199 lb 1.2 oz) 90.3 kg (199 lb 1.2 oz)    Examination:  General exam: Appears calm and comfortable Respiratory system: Clear to auscultation. Respiratory effort normal. Cardiovascular system: S1 & S2 heard, RRR. No murmurs. Right DP palpable. Gastrointestinal system: Abdomen is nondistended, soft and nontender. Normal bowel sounds heard. Central nervous system: Alert and oriented. No focal neurological deficits. Extremities: No edema. No calf tenderness. Left BKA. Skin: No cyanosis. Right 5th digit ulcer wrapped. Psychiatry: Judgement and insight appear normal. Mood & affect appropriate.     Data Reviewed: I have personally reviewed following labs and imaging studies  CBC:  Recent Labs Lab 01/08/17 0359 01/09/17 0801 01/10/17 0818 01/11/17 0216 01/12/17 0916 01/13/17 0238  WBC 8.0 11.5* 10.2 9.9 10.5 8.1  NEUTROABS 6.1 9.3* 7.7 7.9*  --   --   HGB 8.6* 9.4* 9.5* 9.1* 8.4* 7.9*  HCT 26.5* 28.6* 29.7* 28.2* 26.6* 25.1*  MCV 94.6 93.5 94.0 94.9 96.0 95.4  PLT 410* 461* 471* 468* 437* 404*   Basic Metabolic Panel:  Recent Labs Lab 01/07/17 0948 01/08/17 0359  01/09/17 0801 01/10/17 0123 01/10/17 0818 01/11/17 0216 01/12/17 0916 01/13/17 0238  NA 132* 131* 132* 130*  --  128* 130* 130*  K 3.2* 3.3* 3.4* 3.4*  --  4.9 3.7 3.7  CL 93* 92* 95* 95*  --  90* 92* 91*  CO2 --  GLUCOSE 218* 344* 167* 188*  --  273* 335* 360*  BUN 53* 53* 46* 50*  --  47* 46* 46*  CREATININE 6.31* 6.47* 6.22* 6.36*  --  6.14* 7.00* 6.94*  CALCIUM 8.2* 8.1* 8.1* 8.0*  --  8.1* 7.9* 7.9*  MG 1.6* 1.5* 1.6*  --  1.8 2.0  --   --   PHOS 3.8 3.6 3.1 4.0 4.0 5.0*  --  5.4*   GFR: Estimated Creatinine Clearance: 10.2 mL/min (A) (by C-G formula based on SCr of 6.94 mg/dL (H)). Liver Function Tests:  Recent Labs Lab 01/08/17 0359 01/09/17 0801 01/10/17 0123 01/11/17 0216 01/12/17 0916 01/13/17 0238  AST 79* 269*  --  566* 437* 308*  ALT 33 53  --  105* 52 19  ALKPHOS 63 67  --  67 60 61  BILITOT 0.4 0.2*  --  2.2* 0.3 0.5  PROT 5.9* 5.9*  --  5.6* 5.6* 5.4*  ALBUMIN 1.3* 1.4* 1.4* 1.4* 1.3* 1.3*   No results for input(s): LIPASE, AMYLASE in the last 168 hours. No results for input(s): AMMONIA in the last 168 hours. Coagulation Profile:  Recent Labs Lab 01/09/17 1424  INR 1.20   Cardiac Enzymes:  Recent Labs Lab 01/07/17 0948 01/08/17 1056 01/08/17 1604 01/08/17 2227  CKTOTAL 52  --   --   --   TROPONINI  --  0.27* 0.51* 1.73*   BNP (last 3 results) No results for input(s): PROBNP in the last 8760 hours. HbA1C: No results for input(s): HGBA1C in the last 72 hours. CBG:  Recent Labs Lab 01/12/17 0743 01/12/17 1255 01/12/17 1744 01/12/17 2109 01/13/17 0734  GLUCAP 350* 308* 239* 277* 367*   Lipid Profile: No results for input(s): CHOL, HDL, LDLCALC, TRIG, CHOLHDL, LDLDIRECT in the last 72 hours. Thyroid Function Tests: No results for input(s): TSH, T4TOTAL, FREET4, T3FREE, THYROIDAB in the last 72 hours. Anemia Panel: No results for input(s): VITAMINB12, FOLATE, FERRITIN, TIBC, IRON, RETICCTPCT in the last 72  hours. Sepsis Labs: No results for input(s): PROCALCITON, LATICACIDVEN in the last 168 hours.  Recent Results (from the past 240 hour(s))  Culture, blood (routine x 2)     Status: None   Collection Time: 01/03/17 10:41 PM  Result Value Ref Range Status   Specimen Description BLOOD RIGHT HAND  Final   Special Requests   Final    BOTTLES DRAWN AEROBIC AND ANAEROBIC Blood Culture adequate volume   Culture NO GROWTH 5 DAYS  Final   Report Status 01/09/2017 FINAL  Final  Culture, blood (routine x 2)     Status: None   Collection Time: 01/03/17 10:41 PM  Result Value Ref Range Status   Specimen Description BLOOD RIGHT ANTECUBITAL  Final   Special Requests   Final    BOTTLES DRAWN AEROBIC AND ANAEROBIC Blood Culture adequate volume   Culture NO GROWTH 5 DAYS  Final   Report Status 01/09/2017 FINAL  Final  MRSA PCR Screening     Status: None   Collection Time: 01/08/17  4:15 PM  Result Value Ref Range Status   MRSA by PCR NEGATIVE NEGATIVE Final    Comment:        The GeneXpert MRSA Assay (FDA approved for NASAL specimens only), is one component of a comprehensive MRSA colonization surveillance program. It is not intended to diagnose MRSA infection nor to guide or monitor treatment for MRSA infections.   Surgical pcr screen     Status: None   Collection Time: 01/11/17  4:00 AM  Result Value Ref Range Status   MRSA, PCR NEGATIVE NEGATIVE Final   Staphylococcus aureus NEGATIVE NEGATIVE Final    Comment: (NOTE) The Xpert SA Assay (FDA approved for NASAL specimens in patients 5 years of age and older), is one component of a comprehensive surveillance program. It is not intended to diagnose infection nor to guide or monitor treatment.          Radiology Studies: Dg Chest 2 View  Result Date: 01/12/2017 CLINICAL DATA:  CHF EXAM: CHEST  2 VIEW COMPARISON:  01/03/2017 chest radiograph. FINDINGS: Intact sternotomy wires. Subcutaneous single lead left chest ICD is stable with  lead tip overlying the medial left  upper chest. Stable cardiomediastinal silhouette with mild cardiomegaly and aortic atherosclerosis. No pneumothorax. Trace bilateral pleural effusions. Cephalization of the pulmonary vasculature, with no overt pulmonary edema. Mild left basilar atelectasis. IMPRESSION: 1. Stable cardiomegaly without overt pulmonary edema. 2. Trace bilateral pleural effusions and mild left basilar atelectasis. Electronically Signed   By: Delbert Phenix M.D.   On: 01/12/2017 10:12   US Abdomen Limited Ruq  Result Date: 01/11/2017 CLINICAL DATA:  Elevated LFTs. EXAM: ULTRASOUND ABDOMEN LIMITED RIGHT UPPER QUADRANT COMPARISON:  12/19/2016 FINDINGS: Gallbladder: Evidence of sludge versus nonshadowing stones over the dependent portion of the gallbladder/neck. Largest measures 7 mm. No wall thickening or adjacent free fluid. Negative sonographic Murphy sign. Common bile duct: Diameter: 4.2 mm. Liver: No focal lesion identified. Within normal limits in parenchymal echogenicity. Portal vein is patent on color Doppler imaging with normal direction of blood flow towards the liver. IMPRESSION: Findings suggesting mild sludge versus small nonshadowing stones over the dependent portion of the gallbladder. No additional sonographic evidence to suggest cholecystitis. Electronically Signed   By: Elberta Fortis M.D.   On: 01/11/2017 21:50        Scheduled Meds: . aspirin  81 mg Oral Daily  . calcium acetate  667 mg Oral TID WC  . carvedilol  12.5 mg Oral QHS  . clopidogrel  75 mg Oral Q breakfast  . darbepoetin (ARANESP) injection - NON-DIALYSIS  200 mcg Subcutaneous Q Tue-1800  . diclofenac sodium  2 g Topical QID  . docusate sodium  100 mg Oral BID  . feeding supplement (NEPRO CARB STEADY)  237 mL Oral BID BM  . gentamicin cream  1 application Topical Daily  . gentamicin cream   Topical BID  . heparin subcutaneous  5,000 Units Subcutaneous Q8H  . insulin aspart  0-9 Units Subcutaneous TID WC   . insulin NPH Human  40 Units Subcutaneous QAC breakfast  . insulin NPH Human  45 Units Subcutaneous QHS  . midodrine  5 mg Oral BID WC  . multivitamin  1 tablet Oral QHS  . senna  1 tablet Oral BID  . sodium chloride flush  3 mL Intravenous Q12H  . sodium chloride flush  3 mL Intravenous Q12H  . Vitamin D (Ergocalciferol)  50,000 Units Oral Q30 days   Continuous Infusions: . sodium chloride 250 mL (01/08/17 2000)  . sodium chloride    . sodium chloride 10 mL/hr at 01/11/17 1005  . sodium chloride 10 mL/hr at 01/12/17 1756  . dialysis solution 2.5% low-MG/low-CA    . dialysis solution 4.25% low-MG/low-CA       LOS: 7 days     Jacquelin Hawking, MD [facility] 01/13/2017, 12:25 PM Pager: [phone]  If 7PM-7AM, please contact night-coverage [email or web address] Password [email or web address] 01/13/2017, 12:25 PM

## 2017-01-13 NOTE — Progress Notes (Signed)
Initial Nutrition Assessment  DOCUMENTATION CODES:   Obesity unspecified  INTERVENTION:   -Nepro Shake po BID, each supplement provides 425 kcal and 19 grams protein -30 ml Prostat BID, each supplement provides 100 kcal and 15 grams protein -Continue renal MVI daily  NUTRITION DIAGNOSIS:   Increased nutrient needs related to wound healing as evidenced by estimated needs.  GOAL:   Patient will meet greater than or equal to 90% of their needs  MONITOR:   PO intake, Supplement acceptance, Labs, Weight trends, Skin, I & O's  REASON FOR ASSESSMENT:   Consult Assessment of nutrition requirement/status  ASSESSMENT:   [name] is a 76 y.o.-year-old male who is status post revascularization to the left lower extremity and a transmetatarsal amputation. Patient has had progressive dehiscence and necrosis to the residual limb and presents at this time for transtibial amputation.[name]  10/3- s/p lt BKA  Case discussed with RN, who reports pt receives nocturnal PD. Appetite has been fair to poor this admission. Meal completion 0-90%.   Pt complains at time of visit. Pt flat at time of visit and deferred interview to his wife. Per pt wife, intake has been variable over the past 6 months. Intake has significantly declined during hospitalization and CIR stay. Pt consumes very little due to early satiety- consumed some fruit juce and cereal for breakfast this morning. She is concerned about pt's lack of protein intake, as he does not tolerate meat well, however, has been consume 2 Nepro shakes daily. Wife also reports he took Prostat well when on CIR. She and pt are amenable to both Nepro and Prostat. Discussed importance of good meal and supplement completion to promote healing.  Both suspect some wt loss during hospitalization. Pt reports UBW around 202#.   Nutrition-Focused physical exam completed. Findings are no fat depletion, no muscle depletion, and mild edema.   Medications reviewed  and include calcium acetate,   Labs reviewed: Na: 130, CBGS: 182-367 (inpatient orders for glycemic control are 0-9 units, 40 units insulin NPH before breakfast, 45 units insulin NPH q HS).   Diet Order:  Diet Carb Modified Fluid consistency: Thin; Room service appropriate? Yes  Skin:  Wound (see comment) (st 2 bridge of nose and rt upper ear, wound vac to lt BKA)  Last BM:  01/10/17  Height:   Ht Readings from Last 1 Encounters:  01/13/17  (1.753 m)    Weight:   Wt Readings from Last 1 Encounters:  01/13/17 199 lb 1.2 oz (90.3 kg)    Ideal Body Weight:  68 kg  BMI:  Body mass index is 29.4 kg/m.  Estimated Nutritional Needs:   Kcal:  2000-2200  Protein:  105-120 grams  Fluid:  1.2 L  EDUCATION NEEDS:   Education needs addressed  [name], RD, LDN, CDE Pager: [phone] After hours Pager: [phone]

## 2017-01-13 NOTE — Progress Notes (Signed)
[facility] Rounding Note Subjective:    No PD issues  POD#2 L BKA Looking into CIR for pt (cannot go to SNF on PD)  Objective Vital signs in last 24 hours: Vitals:   01/13/17 0327 01/13/17 0600 01/13/17 0732 01/13/17 0845  BP: (!) 129/51  (!) 139/43 (!) 139/52  Pulse: 84   90  Resp: 19   19  Temp: 98.6 F (37 C)  97.7 F (36.5 C) (!) 97.4 F (36.3 C)  TempSrc: Axillary  Oral Oral  SpO2: 99%   99%  Weight:  90.3 kg (199 lb 1.2 oz)  90.3 kg (199 lb 1.2 oz)  Height:       Weight change:   Intake/Output Summary (Last 24 hours) at 01/13/17 1000 Last data filed at 01/13/17 0845  Gross per 24 hour  Intake            12735 ml  Output            13582 ml  Net             -847 ml    Physical Exam: WNWD male, lying in bed - PD still ongoing Wife with him NAD Excoriations bridge of nose healing Regular S1S2 No S3 Lungs diminished at bases esp right Abdomen +BS, soft and not tender. PD catheter left abd with dry dressings and no abd tenderness Minimal LE edema today L BKA Small R 5th toe ulcer no gangrene Dialysis Access: PD cath, left upper arm AVF (+ bruit)   Recent Labs Lab 01/10/17 0818 01/11/17 0216 01/12/17 0916 01/13/17 0238  NA  --  128* 130* 130*  K  --  4.9 3.7 3.7  CL  --  90* 92* 91*  CO2  --  GLUCOSE  --  273* 335* 360*  BUN  --  47* 46* 46*  CREATININE  --  6.14* 7.00* 6.94*  CALCIUM  --  8.1* 7.9* 7.9*  PHOS 4.0 5.0*  --  5.4*    Recent Labs Lab 01/11/17 0216 01/12/17 0916 01/13/17 0238  AST 566* 437* 308*  ALT 105* 52 19  ALKPHOS 67 60 61  BILITOT 2.2* 0.3 0.5  PROT 5.6* 5.6* 5.4*  ALBUMIN 1.4* 1.3* 1.3*    Recent Labs Lab 01/09/17 0801 01/10/17 0818 01/11/17 0216 01/12/17 0916 01/13/17 0238  WBC 11.5* 10.2 9.9 10.5 8.1  NEUTROABS 9.3* 7.7 7.9*  --   --   HGB 9.4* 9.5* 9.1* 8.4* 7.9*  HCT 28.6* 29.7* 28.2* 26.6* 25.1*  MCV 93.5 94.0 94.9 96.0 95.4  PLT 461* 471* 468* 437* 404*    Recent Labs Lab 01/07/17 0948  01/08/17 1056 01/08/17 1604 01/08/17 2227  CKTOTAL 52  --   --   --   TROPONINI  --  0.27* 0.51* 1.73*     Recent Labs Lab 01/12/17 0743 01/12/17 1255 01/12/17 1744 01/12/17 2109 01/13/17 0734  GLUCAP 350* 308* 239* 277* 367*    Medications: Infusions: . sodium chloride 250 mL (01/08/17 2000)  . sodium chloride    . sodium chloride 10 mL/hr at 01/11/17 1005  . sodium chloride 10 mL/hr at 01/12/17 1756  . dialysis solution 2.5% low-MG/low-CA    . methocarbamol (ROBAXIN)  IV      Scheduled Medications: . aspirin  81 mg Oral Daily  . calcium acetate  667 mg Oral TID WC  . carvedilol  12.5 mg Oral QHS  . clopidogrel  75 mg Oral Q breakfast  .  darbepoetin (ARANESP) injection - NON-DIALYSIS  200 mcg Subcutaneous Q Tue-1800  . diclofenac sodium  2 g Topical QID  . docusate sodium  100 mg Oral BID  . feeding supplement (NEPRO CARB STEADY)  237 mL Oral BID BM  . gentamicin cream  1 application Topical Daily  . gentamicin cream   Topical BID  . heparin subcutaneous  5,000 Units Subcutaneous Q8H  . insulin aspart  0-9 Units Subcutaneous TID WC  . insulin NPH Human  40 Units Subcutaneous QAC breakfast  . insulin NPH Human  45 Units Subcutaneous QHS  . midodrine  5 mg Oral BID WC  . multivitamin  1 tablet Oral QHS  . senna  1 tablet Oral BID  . sodium chloride flush  3 mL Intravenous Q12H  . sodium chloride flush  3 mL Intravenous Q12H  . Vitamin D (Ergocalciferol)  50,000 Units Oral Q30 days    Dialysis Orders:  [facility]: [place] [facility]. 6 exchanges/day one mid-day exchange (occurs around 9am).  2.5 liters of 2.5% per [facility] 2 liters of 2.5% for last fill and mid day drain. Pt says EDW "about 202" Dr. Denny Peon  1. PAD 1. s/p open repair R common femoral artery pseudoaneurysm 12/15/16 ([facility]) and L TMA 12/23/16 Lajoyce Corners).   2. [name] and dapto - need duration of ATB Rx post BKA 3. S/p left BKA 10/3 Dr. Lajoyce Corners 4. R small toe ulcer - local Rx 5. Mention made by  Dr. Lajoyce Corners of possible SNF - peritoneal dialysis cannot be done in SNF - Looking into CIR  2. Chest pain/+ troponins.  1. Prior CABG. Stent to RCA 07/2016 d/t NSTEMI. ICD in place.  2. Recurrent CP/+ trops. Cath 10/1 no intervention required.   3. ESRD- [facility], using 2.5% dextrose at home; Altered regimen for ease in hospital (no pause or day bag)  1. Usually all 2.5% at home, have adjusted here according to weight and BP 2. Used all 2.5% bags last night 3. Looks fairly euvolemic peripherally. Base crackles lung exam - effusions on CXR from 10.4 at weight 90.3 4. Will use 1/2 1/2 tonight 5. Lower EDW post BKA ("about 202" at home; 199 now w/pleural effusions)   4. Orthostatic hypotension - midodrine  5. Anemia (ABLA)- TSAT 45% and Ferritin 2284,  1. ^Aranesp 200 dosed 10/2 (max ESA) 2. Hb declining/transfuse as necessary  6. Hypokalemia: Monitor for need for replacement. (fine today)  7. Myoclonus / Confusion - early in admisison; Attributed to gabapentin and robaxin stopped).   8. Secondary HPT - phoslo. Regular diet to promote nutrition  9. DM - per primary team  10. Abnormal transaminases - AST AND ALT starting to improve.    Camille Bal, MD [facility] [phone] Pager 01/13/2017, 10:00 AM

## 2017-01-14 LAB — COMPREHENSIVE METABOLIC PANEL
ALK PHOS: 58 U/L (ref 38–126)
ALT: 9 U/L — AB (ref 17–63)
AST: 185 U/L — ABNORMAL HIGH (ref 15–41)
Albumin: 1.3 g/dL — ABNORMAL LOW (ref 3.5–5.0)
Anion gap: 13 (ref 5–15)
BILIRUBIN TOTAL: 0.5 mg/dL (ref 0.3–1.2)
BUN: 49 mg/dL — ABNORMAL HIGH (ref 6–20)
CALCIUM: 8.1 mg/dL — AB (ref 8.9–10.3)
CO2: 25 mmol/L (ref 22–32)
CREATININE: 7.26 mg/dL — AB (ref 0.61–1.24)
Chloride: 92 mmol/L — ABNORMAL LOW (ref 101–111)
GFR calc non Af Amer: 7 mL/min — ABNORMAL LOW (ref >60)
GFR, EST AFRICAN AMERICAN: 8 mL/min — AB (ref >60)
Glucose, Bld: 328 mg/dL — ABNORMAL HIGH (ref 65–99)
Potassium: 3.6 mmol/L (ref 3.5–5.1)
SODIUM: 130 mmol/L — AB (ref 135–145)
Total Protein: 5.6 g/dL — ABNORMAL LOW (ref 6.5–8.1)

## 2017-01-14 LAB — GLUCOSE, CAPILLARY
GLUCOSE-CAPILLARY: 316 mg/dL — AB (ref 65–99)
GLUCOSE-CAPILLARY: 210 mg/dL — AB (ref 65–99)
GLUCOSE-CAPILLARY: 84 mg/dL (ref 65–99)
Glucose-Capillary: 212 mg/dL — ABNORMAL HIGH (ref 65–99)

## 2017-01-14 NOTE — Plan of Care (Signed)
Problem: Skin Integrity: Goal: Risk for impaired skin integrity will decrease Outcome: Progressing Discussed with pt and family to importance of turning Q2H for the prevention of pressure injuries. Need reinforcement

## 2017-01-14 NOTE — Progress Notes (Signed)
Patient assisted up to bedside recliner w/assist of staff x 2; standing pivot to recliner.  Patient tolerated well.  Able to life LLE in order to put pillow beneath it for support.  Encouraging increased activity tolerance.  Will continue to monitor.

## 2017-01-14 NOTE — Progress Notes (Signed)
PROGRESS NOTE    ILYAAS [name]  ZOX:[id number] DOB: 1940-04-19 DOA: 01/06/2017 PCP: Dorice [name], MD   Brief Narrative: [name] is a 76 y.o. malewith hx of HTN, CVA, ESRD on PD, DM on insulin, COPD and CHFw AICD, hx CAD/ CABG 2011. Pt was admitted on 12/15/16 for non-healing L foot wound and R groin pseudoaneurysm, underwent repair of PA R groin and also resection of left 3rd metatarsal head by Dr Randie Heinz. However, the left foot progressed and developed gangrenous changes, so on 9/14 pt went for L transmetatarsal amputation by Dr Lajoyce Corners. Post op abx were stopped and pt admitted to rehab on 12/27/16.   On 9/23 pt had fevers and persistent ^WBC and tachycardia. Started on [place] due to concerns of possible infection. UcX and Blood cx's were negative. CXR was negative. Pt had increasing L foot pain w/ some skin changes/ blistering and serous drainage from L TMA wound. Dr Lajoyce Corners was consulted and per the chart, possible plan is for surgical intervention next week. Discussed with Dr. Lajoyce Corners and will see the patient Monday. C/w Abx for now.   Patient developed Chest Pain on 9/30 so he was given full dose ASA, SL NTG, and a Stat EKG and Troponin was ordered. EKG reviewed and showed significant ST Depressions (agreed by Cardiologist Dr. Donnie Aho) so Cardiology was consulted and patient was placed on a NTG gtt and Heparin gtt and transferred to SDU. Patient underwent catheterization and required no stenting and there was no critical stenosis identified. Cardiology cleared the patient and plan is for Left BKA in AM.    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 Wound vac in place. Antibiotics discontinued -orthopedic surgery recommendations: wound vac x1 week, staples out x2 weeks  Elevated AST/ALT Asymptomatic. Recent ultrasound significant for gallbladder polyp. RUQ ultrasound on 10/3 significant for sludge and shadow. Continues to trend down  Acute chest pain NSTEMI S/p cath without critical stenosis. Cardiology recommended okay for surgery. Nitroglycerin and heparin drip discontinued. -continue Plavix and Aspirin  ESRD on PD -nephrology recommendations  History of CVA -continue aspirin and Plavix -holding atorvastatin with elevated transaminases  PVD S/p left peroneal artery revascularization. -continue aspirin and Plavix  Anemia of CKD Stable  Hypotension -continue Midodrine  CAD Hx of CABG ICD -continue carvedilol, aspirin and Plavix -isosorbide mononitrate discontinued by cardiology -atorvastatin on hold secondary to elevated transaminases. Restart when improved.  Hypokalemia Hypomagensemia -Replete as needed  COPD No exacerbation. Stable.  Chronic systolic CHF No exacerbation. ICD in place. Last EF of 45% -continue strict I/O, daily weights  Hyponatremia Chronic. Stable.   DVT prophylaxis: subq heparin Code Status: Full code Family Communication: Wife at bedside Disposition Plan: CIR   Consultants:   Orthopedic surgery  Nephrology  Cardiology  Procedures:  CARDIAC CATHETERIZATION  Ost RCA to Prox RCA lesion, 0 %stenosed.  LM lesion, 100 %stenosed.  LIMA.  SVG.  Origin lesion, 100 %stenosed.  SVG.  Dist Graft to Insertion lesion, 40 %stenosed.  1st Mrg lesion, 75 %stenosed.  Ost Cx to Prox Cx lesion, 80 %stenosed.  Ost 3rd Mrg lesion, 100 %stenosed.  Ost 1st Mrg  lesion, 100 %stenosed.  Mid Cx lesion, 60 %stenosed.  Antimicrobials:  Ceftazidime  Daptomycin     Subjective: Pain managed. Using oxycodone less  Objective: Vitals:   01/13/17 2354 01/14/17 0000 01/14/17 0400 01/14/17 0800  BP: (!) 157/65 (!) 148/61 107/61 94/69  Pulse: 87 87 87 89  Resp: Temp: 98.7 F (37.1 C)  98.5 F (36.9 C) 99.5 F (37.5 C)  TempSrc: Axillary  Axillary Oral  SpO2: 97% 97% 98% 100%  Weight:      Height:        Intake/Output Summary (Last 24 hours) at 01/14/17 1130 Last data filed at 01/14/17 0800  Gross per 24 hour  Intake            13185 ml  Output            13287 ml  Net             -102 ml   Filed Weights   01/11/17 0500 01/13/17 0600 01/13/17 0845  Weight: 91 kg (200 lb 9.9 oz) 90.3 kg (199 lb 1.2 oz) 90.3 kg (199 lb 1.2 oz)    Examination:  General exam: Appears calm and comfortable Respiratory system: Clear to auscultation bilaterally. Unlabored work of breathing. No wheezing or rales. Cardiovascular system: S1 & S2 heard, RRR. No murmurs. Right DP palpable. Gastrointestinal system: Abdomen is nondistended, soft and nontender. Normal bowel sounds heard. Central nervous system: Alert and oriented. No focal neurological deficits. Extremities: No edema. No calf tenderness. Left BKA. Skin: No cyanosis. Right 5th digit ulcer wrapped. Psychiatry: Judgement and insight appear normal. Mood & affect appropriate.     Data Reviewed: I have personally reviewed following labs and imaging studies  CBC:  Recent Labs Lab 01/08/17 0359 01/09/17 0801 01/10/17 0818 01/11/17 0216 01/12/17 0916 01/13/17 0238  WBC 8.0 11.5* 10.2 9.9 10.5 8.1  NEUTROABS 6.1 9.3* 7.7 7.9*  --   --   HGB 8.6* 9.4* 9.5* 9.1* 8.4* 7.9*  HCT 26.5* 28.6* 29.7* 28.2* 26.6* 25.1*  MCV 94.6 93.5 94.0 94.9 96.0 95.4  PLT 410* 461* 471* 468* 437* 404*   Basic Metabolic Panel:  Recent Labs Lab 01/08/17 0359 01/09/17 0801 01/10/17 0123  01/10/17 0818 01/11/17 0216 01/12/17 0916 01/13/17 0238 01/14/17 0416  NA 131* 132* 130*  --  128* 130* 130* 130*  K 3.3* 3.4* 3.4*  --  4.9 3.7 3.7 3.6  CL 92* 95* 95*  --  90* 92* 91* 92*  CO2 --  GLUCOSE 344* 167* 188*  --  273* 335* 360* 328*  BUN 53* 46* 50*  --  47* 46* 46* 49*  CREATININE 6.47* 6.22* 6.36*  --  6.14* 7.00* 6.94* 7.26*  CALCIUM 8.1* 8.1* 8.0*  --  8.1* 7.9* 7.9* 8.1*  MG 1.5* 1.6*  --  1.8 2.0  --   --   --   PHOS 3.6 3.1 4.0 4.0 5.0*  --  5.4*  --    GFR: Estimated Creatinine Clearance: 9.8 mL/min (A) (by C-G formula based on SCr of 7.26 mg/dL (H)). Liver Function Tests:  Recent Labs Lab 01/09/17 0801 01/10/17 0123 01/11/17 0216 01/12/17 6644 01/13/17 0238 01/14/17 0416  AST 269*  --  566* 437* 308* 185*  ALT 53  --  105* 52 19 9*  ALKPHOS 67  --  67 60 61 58  BILITOT 0.2*  --  2.2* 0.3 0.5 0.5  PROT 5.9*  --  5.6* 5.6* 5.4* 5.6*  ALBUMIN 1.4* 1.4* 1.4* 1.3* 1.3* 1.3*   No results for input(s): LIPASE, AMYLASE in the last 168 hours. No results for input(s): AMMONIA in the last 168 hours. Coagulation Profile:  Recent Labs Lab 01/09/17 1424  INR 1.20   Cardiac Enzymes:  Recent Labs Lab 01/08/17 1056 01/08/17 1604 01/08/17 2227  TROPONINI 0.27* 0.51* 1.73*   BNP (last 3 results) No results for input(s): PROBNP in the last 8760 hours. HbA1C: No results for input(s): HGBA1C in the last 72 hours. CBG:  Recent Labs Lab 01/13/17 0734 01/13/17 1235 01/13/17 1642 01/13/17 2122 01/14/17 0733  GLUCAP 367* 296* 182* 274* 316*   Lipid Profile: No results for input(s): CHOL, HDL, LDLCALC, TRIG, CHOLHDL, LDLDIRECT in the last 72 hours. Thyroid Function Tests: No results for input(s): TSH, T4TOTAL, FREET4, T3FREE, THYROIDAB in the last 72 hours. Anemia Panel: No results for input(s): VITAMINB12, FOLATE, FERRITIN, TIBC, IRON, RETICCTPCT in the last 72 hours. Sepsis Labs: No results for input(s): PROCALCITON,  LATICACIDVEN in the last 168 hours.  Recent Results (from the past 240 hour(s))  MRSA PCR Screening     Status: None   Collection Time: 01/08/17  4:15 PM  Result Value Ref Range Status   MRSA by PCR NEGATIVE NEGATIVE Final    Comment:        The GeneXpert MRSA Assay (FDA approved for NASAL specimens only), is one component of a comprehensive MRSA colonization surveillance program. It is not intended to diagnose MRSA infection nor to guide or monitor treatment for MRSA infections.   Surgical pcr screen     Status: None   Collection Time: 01/11/17  4:00 AM  Result Value Ref Range Status   MRSA, PCR NEGATIVE NEGATIVE Final   Staphylococcus aureus NEGATIVE NEGATIVE Final    Comment: (NOTE) The Xpert SA Assay (FDA approved for NASAL specimens in patients 56 years of age and older), is one component of a comprehensive surveillance program. It is not intended to diagnose infection nor to guide or monitor treatment.          Radiology Studies: No results found.      Scheduled Meds: . aspirin  81 mg Oral Daily  . calcium acetate  667 mg Oral TID WC  . carvedilol  12.5 mg Oral QHS  . clopidogrel  75 mg Oral Q breakfast  . darbepoetin (ARANESP) injection - NON-DIALYSIS  200 mcg Subcutaneous Q Tue-1800  . diclofenac sodium  2 g Topical QID  . docusate sodium  100 mg Oral BID  . feeding supplement (NEPRO CARB STEADY)  237 mL Oral BID BM  . feeding supplement (PRO-STAT SUGAR FREE 64)  30 mL Oral BID  . gentamicin cream  1 application Topical Daily  . heparin subcutaneous  5,000 Units Subcutaneous Q8H  . insulin aspart  0-9 Units Subcutaneous TID WC  . insulin NPH Human  40 Units Subcutaneous QAC breakfast  . insulin NPH Human  45 Units Subcutaneous QHS  . midodrine  5 mg Oral BID WC  . multivitamin  1 tablet Oral QHS  . senna  1 tablet Oral BID  . sodium chloride flush  3 mL Intravenous Q12H  . sodium chloride flush  3 mL Intravenous Q12H  . Vitamin  D (Ergocalciferol)   50,000 Units Oral Q30 days   Continuous Infusions: . sodium chloride 250 mL (01/14/17 0800)  . sodium chloride    . sodium chloride 10 mL/hr at 01/11/17 1005  . sodium chloride 10 mL/hr at 01/12/17 1756  . dialysis solution 2.5% low-MG/low-CA    . dialysis solution 4.25% low-MG/low-CA       LOS: 8 days     Jacquelin Hawking, MD [facility] 01/14/2017, 11:30 AM Pager: [phone]  If 7PM-7AM, please contact night-coverage [email or web address] Password [email or web address] 01/14/2017, 11:30 AM

## 2017-01-14 NOTE — Progress Notes (Signed)
[facility] Rounding Note Subjective:    No PD issues  POD#3 L BKA Looking into CIR for pt (cannot go to SNF on PD) No complaints and says "I feel pretty good this morning" No PD problems last night  Objective Vital signs in last 24 hours: Vitals:   01/13/17 2150 01/13/17 2354 01/14/17 0000 01/14/17 0400  BP: (!) 143/62 (!) 157/65 (!) 148/61 107/61  Pulse: 96 87 87 87  Resp:  '15 17 20  ' Temp:  98.7 F (37.1 C)  98.5 F (36.9 C)  TempSrc:  Axillary  Axillary  SpO2:  97% 97% 98%  Weight:      Height:       Weight change: 0 kg (0 lb)  Intake/Output Summary (Last 24 hours) at 01/14/17 8299 Last data filed at 01/13/17 2000  Gross per 24 hour  Intake            503.5 ml  Output                0 ml  Net            503.5 ml    Physical Exam: WNWD male, lying in bed  Wife with him NAD Excoriations bridge of nose healing (2/2 to his CPAP mask) Regular [id number] No S3 1/6 systolic murmur LSB Lungs diminished at bases Abdomen +BS, soft and not tender. PD catheter left abd with dry dressings and no abd tenderness No edema L BKA wrapped Small R 5th toe ulcer no gangrene Dialysis Access: PD cath, left upper arm AVF (+ bruit)   Recent Labs Lab 01/10/17 0818 01/11/17 0216 01/12/17 0916 01/13/17 0238 01/14/17 0416  NA  --  128* 130* 130* 130*  K  --  4.9 3.7 3.7 3.6  CL  --  90* 92* 91* 92*  CO2  --  '25 25 25 25  ' GLUCOSE  --  273* 335* 360* 328*  BUN  --  47* 46* 46* 49*  CREATININE  --  6.14* 7.00* 6.94* 7.26*  CALCIUM  --  8.1* 7.9* 7.9* 8.1*  PHOS 4.0 5.0*  --  5.4*  --    Hepatic Function Latest Ref Rng & Units 01/14/2017 01/13/2017 01/12/2017  Total Protein 6.5 - 8.1 g/dL 5.6(L) 5.4(L) 5.6(L)  Albumin 3.5 - 5.0 g/dL 1.3(L) 1.3(L) 1.3(L)  AST 15 - 41 U/L 185(H) 308(H) 437(H)  ALT 17 - 63 U/L 9(L) 19 52  Alk Phosphatase 38 - 126 U/L 58 61 60  Total Bilirubin 0.3 - 1.2 mg/dL 0.5 0.5 0.3    Recent Labs Lab 01/12/17 0916 01/13/17 0238 01/14/17 0416  AST 437* 308* 185*   ALT 52 19 9*  ALKPHOS 60 61 58  BILITOT 0.3 0.5 0.5  PROT 5.6* 5.4* 5.6*  ALBUMIN 1.3* 1.3* 1.3*    Recent Labs Lab 01/09/17 0801 01/10/17 0818 01/11/17 0216 01/12/17 0916 01/13/17 0238  WBC 11.5* 10.2 9.9 10.5 8.1  NEUTROABS 9.3* 7.7 7.9*  --   --   HGB 9.4* 9.5* 9.1* 8.4* 7.9*  HCT 28.6* 29.7* 28.2* 26.6* 25.1*  MCV 93.5 94.0 94.9 96.0 95.4  PLT 461* 471* 468* 437* 404*    Recent Labs Lab 01/07/17 0948 01/08/17 1056 01/08/17 1604 01/08/17 2227  CKTOTAL 52  --   --   --   TROPONINI  --  0.27* 0.51* 1.73*     Recent Labs Lab 01/12/17 2109 01/13/17 0734 01/13/17 1235 01/13/17 1642 01/13/17 2122  GLUCAP 277* 367* 296* 182* 274*  Medications: Infusions: . sodium chloride 250 mL (01/08/17 2000)  . sodium chloride    . sodium chloride 10 mL/hr at 01/11/17 1005  . sodium chloride 10 mL/hr at 01/12/17 1756  . dialysis solution 2.5% low-MG/low-CA    . dialysis solution 4.25% low-MG/low-CA      Scheduled Medications: . aspirin  81 mg Oral Daily  . calcium acetate  667 mg Oral TID WC  . carvedilol  12.5 mg Oral QHS  . clopidogrel  75 mg Oral Q breakfast  . darbepoetin (ARANESP) injection - NON-DIALYSIS  200 mcg Subcutaneous Q Tue-1800  . diclofenac sodium  2 g Topical QID  . docusate sodium  100 mg Oral BID  . feeding supplement (NEPRO CARB STEADY)  237 mL Oral BID BM  . feeding supplement (PRO-STAT SUGAR FREE 64)  30 mL Oral BID  . gentamicin cream  1 application Topical Daily  . gentamicin cream   Topical BID  . heparin subcutaneous  5,000 Units Subcutaneous Q8H  . insulin aspart  0-9 Units Subcutaneous TID WC  . insulin NPH Human  40 Units Subcutaneous QAC breakfast  . insulin NPH Human  45 Units Subcutaneous QHS  . midodrine  5 mg Oral BID WC  . multivitamin  1 tablet Oral QHS  . senna  1 tablet Oral BID  . sodium chloride flush  3 mL Intravenous Q12H  . sodium chloride flush  3 mL Intravenous Q12H  . Vitamin D (Ergocalciferol)  50,000 Units  Oral Q30 days    Dialysis Orders:  [facility]. 6 exchanges/day one mid-day exchange (occurs around 9am).  2.5 liters of 2.5% per [facility] 2 liters of 2.5% for last fill and mid day drain. Pt says EDW "about 202" (pre admission) [name]  1. PAD 1. s/p open repair R common femoral artery pseudoaneurysm 12/15/16 ([facility]) and L TMA 12/23/16 Sharol Given).   2. Completed course fortaz and Dapto 3. S/p left BKA 10/3 Dr. Sharol Given 4. R small toe ulcer - local Rx 2. Chest pain/+ troponins.  1. Prior CABG. Stent to RCA 07/2016 d/t NSTEMI. ICD in place.  2. Recurrent CP/+ trops. Cath 10/1 no intervention required.  3. ESRD- [facility], using 2.5% dextrose at home; Altered regimen for ease in hospital (no pause or day bag)  1. Usually all 2.5% at home, have adjusted here according to weight and BP 2. Base crackles lung exam - effusions on CXR from 10/4 at weight 90.3 3. Will use 1/2 1/2 4.25/2.5 again tonight (weight did not change past 24 hours still 90.3) 4. Lower EDW post BKA ("about 202" at home; 199 now w/pleural effusions - will be lower than that) 4. Orthostatic hypotension - midodrine 5. Anemia (ABLA)- TSAT 45% and Ferritin 2284 (9/22) 1. ^Aranesp 200 dosed 10/2 (max ESA) 2. Hb declining/transfuse as necessary 6. Hypokalemia: Monitor for need for replacement. (fine today) 7. Myoclonus / Confusion - early in [place]; Attributed to gabapentin and robaxin stopped).  8. Secondary HPT - phoslo. Regular diet to promote nutrition 9. DM - per primary team 10. Abnormal transaminases - improving  11. Malnutrition - supplements ([place]ro/prostat) 12. Dispo - ? CIR   Jamal Maes, MD [facility] Pager 01/14/2017, 9:26 AM

## 2017-01-15 LAB — GLUCOSE, CAPILLARY
Glucose-Capillary: 236 mg/dL — ABNORMAL HIGH (ref 65–99)
Glucose-Capillary: 216 mg/dL — ABNORMAL HIGH (ref 65–99)
Glucose-Capillary: 186 mg/dL — ABNORMAL HIGH (ref 65–99)
Glucose-Capillary: 140 mg/dL — ABNORMAL HIGH (ref 65–99)

## 2017-01-15 LAB — COMPREHENSIVE METABOLIC PANEL
ALBUMIN: 1.3 g/dL — AB (ref 3.5–5.0)
ALK PHOS: 64 U/L (ref 38–126)
ALT: 7 U/L — ABNORMAL LOW (ref 17–63)
ANION GAP: 14 (ref 5–15)
AST: 132 U/L — ABNORMAL HIGH (ref 15–41)
BUN: 50 mg/dL — ABNORMAL HIGH (ref 6–20)
CO2: 27 mmol/L (ref 22–32)
Calcium: 8.5 mg/dL — ABNORMAL LOW (ref 8.9–10.3)
Chloride: 91 mmol/L — ABNORMAL LOW (ref 101–111)
Creatinine, Ser: 7.3 mg/dL — ABNORMAL HIGH (ref 0.61–1.24)
GFR calc Af Amer: 7 mL/min — ABNORMAL LOW (ref >60)
GFR calc non Af Amer: 6 mL/min — ABNORMAL LOW (ref >60)
GLUCOSE: 348 mg/dL — AB (ref 65–99)
POTASSIUM: 3.3 mmol/L — AB (ref 3.5–5.1)
SODIUM: 132 mmol/L — AB (ref 135–145)
Total Bilirubin: 0.4 mg/dL (ref 0.3–1.2)
Total Protein: 5.8 g/dL — ABNORMAL LOW (ref 6.5–8.1)

## 2017-01-15 LAB — CBC
HEMATOCRIT: 24.5 % — AB (ref 39.0–52.0)
HEMOGLOBIN: 7.7 g/dL — AB (ref 13.0–17.0)
MCH: 30.3 pg (ref 26.0–34.0)
MCHC: 31.4 g/dL (ref 30.0–36.0)
MCV: 96.5 fL (ref 78.0–100.0)
Platelets: 436 10*3/uL — ABNORMAL HIGH (ref 150–400)
RBC: 2.54 MIL/uL — ABNORMAL LOW (ref 4.22–5.81)
RDW: 16.2 % — ABNORMAL HIGH (ref 11.5–15.5)
WBC: 11.2 10*3/uL — ABNORMAL HIGH (ref 4.0–10.5)

## 2017-01-15 MED ORDER — DELFLEX-LC/2.5% DEXTROSE 394 MOSM/L IP SOLN
Freq: Every day | INTRAPERITONEAL | Status: DC
Start: 2017-01-15 — End: 2017-01-17

## 2017-01-15 MED ORDER — MORPHINE SULFATE (PF) 4 MG/ML IV SOLN
2.0000 mg | INTRAVENOUS | Status: DC | PRN
  Administered 2017-01-15: 2 mg via INTRAVENOUS
  Filled 2017-01-15: qty 1

## 2017-01-15 MED ORDER — POTASSIUM CHLORIDE CRYS ER 10 MEQ PO TBCR
30.0000 meq | EXTENDED_RELEASE_TABLET | Freq: Once | ORAL | Status: AC
  Administered 2017-01-15: 30 meq via ORAL
  Filled 2017-01-15: qty 1

## 2017-01-15 MED ORDER — INSULIN NPH (HUMAN) (ISOPHANE) 100 UNIT/ML ~~LOC~~ SUSP
50.0000 [IU] | Freq: Every day | SUBCUTANEOUS | Status: DC
  Administered 2017-01-15 – 2017-01-16 (×2): 50 [IU] via SUBCUTANEOUS
  Filled 2017-01-15: qty 10

## 2017-01-15 NOTE — Progress Notes (Signed)
This RN arrived to bedside to initiated PD treatment.  Patient about to be moved from chair to bed for tx set up when bedside RN stated that patient had room ready on 5N-10.  This RN transported supplies and machine to room.  Bedside RN notified HD when patient arrived to 5N-10, however this RN receiving emergent HD treatment from ED and unable to set up PD treatment at this time.  Patient will likely not have PD treatment initiated until after midnight.  Will continue to monitor.

## 2017-01-15 NOTE — Progress Notes (Signed)
PROGRESS NOTE    [name]  WUJ:[id number] DOB: 23-Sep-1940 DOA: 01/06/2017 PCP: Dorice [name], MD   Brief Narrative: [name] is a 76 y.o. malewith hx of HTN, CVA, ESRD on PD, DM on insulin, COPD and CHFw AICD, hx CAD/ CABG 2011. Pt was admitted on 12/15/16 for non-healing L foot wound and R groin pseudoaneurysm, underwent repair of PA R groin and also resection of left 3rd metatarsal head by Dr Randie Heinz. However, the left foot progressed and developed gangrenous changes, so on 9/14 pt went for L transmetatarsal amputation by Dr Lajoyce Corners. Post op abx were stopped and pt admitted to rehab on 12/27/16.   On 9/23 pt had fevers and persistent WBC and tachycardia. Started on [place] due to concerns of possible infection. UcX and Blood cx's were negative. CXR was negative. Pt had increasing L foot pain w/ some skin changes/ blistering and serous drainage from L TMA wound. Dr Lajoyce Corners was consulted and per the chart, possible plan is for surgical intervention next week. Discussed with Dr. Lajoyce Corners and will see the patient Monday. C/w Abx for now.   Patient developed Chest Pain on 9/30 so he was given full dose ASA, SL NTG, and a Stat EKG and Troponin was ordered. EKG reviewed and showed significant ST Depressions (agreed by Cardiologist Dr. Donnie Aho) so Cardiology was consulted and patient was placed on a NTG gtt and Heparin gtt and transferred to SDU. Patient underwent catheterization and required no stenting and there was no critical stenosis identified. Cardiology cleared the patient and plan is for Left BKA in AM.    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 Wound vac in place. Antibiotics discontinued -orthopedic surgery recommendations: wound vac x1 week, staples out x2 weeks  Elevated AST/ALT Asymptomatic. Recent ultrasound significant for gallbladder polyp. RUQ ultrasound on 10/3 significant for sludge and shadow. Continues to trend down.  Acute chest pain NSTEMI S/p cath without critical stenosis. Cardiology recommended okay for surgery. Nitroglycerin and heparin drip discontinued. -continue Plavix and Aspirin  ESRD on PD -nephrology recommendations  History of CVA -continue aspirin and Plavix -holding atorvastatin with elevated transaminases  PVD S/p left peroneal artery revascularization. -continue aspirin and Plavix  Anemia of CKD Stable  Hypotension -continue Midodrine  CAD Hx of CABG ICD -continue carvedilol, aspirin and Plavix -isosorbide mononitrate discontinued by cardiology -atorvastatin on hold secondary to elevated transaminases. Restart when improved.  Hypokalemia Hypomagensemia -Replete as needed  COPD No exacerbation. Stable.  Chronic systolic CHF No exacerbation. ICD in place. Last EF of 45% -continue strict I/O, daily weights  Hyponatremia Chronic. Stable.  Right toe ulcer Chronic. Patient has had successful revascularization of the right limb. No evidence of erythema or purulent drainage to suggest infection.  -defer to wound care/orthopedic surgery with regard to wound care/possible debridement   DVT prophylaxis: subq heparin Code Status: Full code Family Communication: Wife at bedside Disposition Plan: CIR   Consultants:  Orthopedic surgery  Nephrology  Cardiology  Procedures:  CARDIAC CATHETERIZATION  Ost RCA to Prox RCA lesion, 0 %stenosed.  LM lesion,  100 %stenosed.  LIMA.  SVG.  Origin lesion, 100 %stenosed.  SVG.  Dist Graft to Insertion lesion, 40 %stenosed.  1st Mrg lesion, 75 %stenosed.  Ost Cx to Prox Cx lesion, 80 %stenosed.  Ost 3rd Mrg lesion, 100 %stenosed.  Ost 1st Mrg lesion, 100 %stenosed.  Mid Cx lesion, 60 %stenosed.  Antimicrobials:  Ceftazidime  Daptomycin     Subjective: Pain managed. Using oxycodone less  Objective: Vitals:   01/14/17 2343 01/15/17 0334 01/15/17 0500 01/15/17 0800  BP: (!) 137/49 (!) 114/54    Pulse: 73 74    Resp: 17 13    Temp: 99 F (37.2 C) 99.8 F (37.7 C)  98.2 F (36.8 C)  TempSrc: Axillary Axillary  Oral  SpO2: 94% 97%    Weight:   87 kg (191 lb 12.8 oz)   Height:        Intake/Output Summary (Last 24 hours) at 01/15/17 1121 Last data filed at 01/14/17 2200  Gross per 24 hour  Intake              523 ml  Output                0 ml  Net              523 ml   Filed Weights   01/14/17 1200 01/14/17 1715 01/15/17 0500  Weight: 89.2 kg (196 lb 9.6 oz) 88.3 kg (194 lb 10.7 oz) 87 kg (191 lb 12.8 oz)    Examination:  General exam: Appears calm and comfortable Respiratory system: Clear to auscultation bilaterally. Unlabored work of breathing. No wheezing or rales. Cardiovascular system: S1 & S2 heard, RRR. No murmurs. Right DP palpable. Gastrointestinal system: Abdomen is nondistended, soft and nontender. Normal bowel sounds heard. Central nervous system: Alert and oriented. No focal neurological deficits. Extremities: No edema. No calf tenderness. Left BKA. Skin: No cyanosis. Right 5th digit ulcer wrapped. Psychiatry: Judgement and insight appear normal. Mood & affect appropriate.     Data Reviewed: I have personally reviewed following labs and imaging studies  CBC:  Recent Labs Lab 01/09/17 0801 01/10/17 0818 01/11/17 0216 01/12/17 0916 01/13/17 0238 01/15/17 0802  WBC 11.5* 10.2 9.9 10.5 8.1 11.2*  NEUTROABS 9.3* 7.7 7.9*  --   --   --     HGB 9.4* 9.5* 9.1* 8.4* 7.9* 7.7*  HCT 28.6* 29.7* 28.2* 26.6* 25.1* 24.5*  MCV 93.5 94.0 94.9 96.0 95.4 96.5  PLT 461* 471* 468* 437* 404* 436*   Basic Metabolic Panel:  Recent Labs Lab 01/09/17 0801 01/10/17 0123 01/10/17 0818 01/11/17 0216 01/12/17 0916 01/13/17 0238 01/14/17 0416 01/15/17 0212  NA 132* 130*  --  128* 130* 130* 130* 132*  K 3.4* 3.4*  --  4.9 3.7 3.7 3.6 3.3*  CL 95* 95*  --  90* 92* 91* 92* 91*  CO2 26 24  --  GLUCOSE 167* 188*  --  273* 335* 360* 328* 348*  BUN 46* 50*  --  47* 46* 46* 49* 50*  CREATININE 6.22* 6.36*  --  6.14* 7.00* 6.94* 7.26* 7.30*  CALCIUM 8.1* 8.0*  --  8.1* 7.9* 7.9* 8.1* 8.5*  MG 1.6*  --  1.8 2.0  --   --   --   --   PHOS 3.1 4.0 4.0  5.0*  --  5.4*  --   --    GFR: Estimated Creatinine Clearance: 9.5 mL/min (A) (by C-G formula based on SCr of 7.3 mg/dL (H)). Liver Function Tests:  Recent Labs Lab 01/11/17 0216 01/12/17 0916 01/13/17 0238 01/14/17 0416 01/15/17 0212  AST 566* 437* 308* 185* 132*  ALT 105* 52 19 9* 7*  ALKPHOS 67 60 61 58 64  BILITOT 2.2* 0.3 0.5 0.5 0.4  PROT 5.6* 5.6* 5.4* 5.6* 5.8*  ALBUMIN 1.4* 1.3* 1.3* 1.3* 1.3*   No results for input(s): LIPASE, AMYLASE in the last 168 hours. No results for input(s): AMMONIA in the last 168 hours. Coagulation Profile:  Recent Labs Lab 01/09/17 1424  INR 1.20   Cardiac Enzymes:  Recent Labs Lab 01/08/17 1604 01/08/17 2227  TROPONINI 0.51* 1.73*   BNP (last 3 results) No results for input(s): PROBNP in the last 8760 hours. HbA1C: No results for input(s): HGBA1C in the last 72 hours. CBG:  Recent Labs Lab 01/14/17 0733 01/14/17 1125 01/14/17 1538 01/14/17 2145 01/15/17 0810  GLUCAP 316* 210* 84 212* 236*   Lipid Profile: No results for input(s): CHOL, HDL, LDLCALC, TRIG, CHOLHDL, LDLDIRECT in the last 72 hours. Thyroid Function Tests: No results for input(s): TSH, T4TOTAL, FREET4, T3FREE, THYROIDAB in the last 72  hours. Anemia Panel: No results for input(s): VITAMINB12, FOLATE, FERRITIN, TIBC, IRON, RETICCTPCT in the last 72 hours. Sepsis Labs: No results for input(s): PROCALCITON, LATICACIDVEN in the last 168 hours.  Recent Results (from the past 240 hour(s))  MRSA PCR Screening     Status: None   Collection Time: 01/08/17  4:15 PM  Result Value Ref Range Status   MRSA by PCR NEGATIVE NEGATIVE Final    Comment:        The GeneXpert MRSA Assay (FDA approved for NASAL specimens only), is one component of a comprehensive MRSA colonization surveillance program. It is not intended to diagnose MRSA infection nor to guide or monitor treatment for MRSA infections.   Surgical pcr screen     Status: None   Collection Time: 01/11/17  4:00 AM  Result Value Ref Range Status   MRSA, PCR NEGATIVE NEGATIVE Final   Staphylococcus aureus NEGATIVE NEGATIVE Final    Comment: (NOTE) The Xpert SA Assay (FDA approved for NASAL specimens in patients 75 years of age and older), is one component of a comprehensive surveillance program. It is not intended to diagnose infection nor to guide or monitor treatment.          Radiology Studies: No results found.      Scheduled Meds: . aspirin  81 mg Oral Daily  . calcium acetate  667 mg Oral TID WC  . carvedilol  12.5 mg Oral QHS  . clopidogrel  75 mg Oral Q breakfast  . darbepoetin (ARANESP) injection - NON-DIALYSIS  200 mcg Subcutaneous Q Tue-1800  . docusate sodium  100 mg Oral BID  . feeding supplement (NEPRO CARB STEADY)  237 mL Oral BID BM  . feeding supplement (PRO-STAT SUGAR FREE 64)  30 mL Oral BID  . gentamicin cream  1 application Topical Daily  . heparin subcutaneous  5,000 Units Subcutaneous Q8H  . insulin aspart  0-9 Units Subcutaneous TID WC  . insulin NPH Human  50 Units Subcutaneous QHS  . midodrine  5 mg Oral BID WC  . multivitamin  1 tablet Oral QHS  . senna  1 tablet Oral BID  . sodium chloride flush  3 mL Intravenous  Q12H   . sodium chloride flush  3 mL Intravenous Q12H  . Vitamin D (Ergocalciferol)  50,000 Units Oral Q30 days   Continuous Infusions: . sodium chloride 250 mL (01/14/17 1300)  . sodium chloride    . sodium chloride 10 mL/hr at 01/11/17 1005  . sodium chloride 10 mL/hr at 01/12/17 1756  . dialysis solution 2.5% low-MG/low-CA       LOS: 9 days     Jacquelin Hawking, MD [facility] 01/15/2017, 11:21 AM Pager: [phone]  If 7PM-7AM, please contact night-coverage [email or web address] Password [email or web address] 01/15/2017, 11:21 AM

## 2017-01-15 NOTE — Progress Notes (Signed)
[facility] Rounding Note Subjective:    No PD issues (home = 5 2.5 L fills, all 2.5% dialysate, 10 min/1.5 hours/20 min fill/dwell/drain cycle with pause and daytime dwell; altered in the hospital for ease, doing fine)  Last 2 nights used 2.5/4.25 half each to get some volume off Weight down to 87 kg (new EDW post BKA from prior 91 kg) with no cramping  Breathing fine today Anticipating CIR for pt (cannot go to SNF on PD)  Objective Vital signs in last 24 hours: Vitals:   01/14/17 2343 01/15/17 0334 01/15/17 0500 01/15/17 0800  BP: (!) 137/49 (!) 114/54    Pulse: 73 74    Resp: 17 13    Temp: 99 F (37.2 C) 99.8 F (37.7 C)  98.2 F (36.8 C)  TempSrc: Axillary Axillary  Oral  SpO2: 94% 97%    Weight:   87 kg (191 lb 12.8 oz)   Height:       Weight change: -1.123 kg (-2 lb 7.6 oz)  Intake/Output Summary (Last 24 hours) at 01/15/17 0839 Last data filed at 01/14/17 2200  Gross per 24 hour  Intake              523 ml  Output                0 ml  Net              523 ml    Physical Exam: WNWD male, sitting up in bed - looks best I have seen him Wife with him NAD VS as noted, weight down to 87 kg this AM Excoriations bridge of nose healing (2/2 to his CPAP mask) Regular P8K9 No S3 1/6 systolic murmur LSB Lungs diminished at bases Abdomen +BS, soft and not tender. PD catheter left abd with dry dressings and no abd tenderness No edema L BKA wrapped, wound VAC remains in place Small R 5th toe ulcer no gangrene Dialysis Access: PD cath left abdomen and left upper arm AVF (+ bruit)   Recent Labs Lab 01/10/17 0818 01/11/17 0216  01/13/17 0238 01/14/17 0416 01/15/17 0212  NA  --  128*  < > 130* 130* 132*  K  --  4.9  < > 3.7 3.6 3.3*  CL  --  90*  < > 91* 92* 91*  CO2  --  25  < > '25 25 27  ' GLUCOSE  --  273*  < > 360* 328* 348*  BUN  --  47*  < > 46* 49* 50*  CREATININE  --  6.14*  < > 6.94* 7.26* 7.30*  CALCIUM  --  8.1*  < > 7.9* 8.1* 8.5*  PHOS 4.0 5.0*  --  5.4*   --   --   < > = values in this interval not displayed.   Hepatic Function Latest Ref Rng & Units 01/15/2017 01/14/2017 01/13/2017  Total Protein 6.5 - 8.1 g/dL 5.8(L) 5.6(L) 5.4(L)  Albumin 3.5 - 5.0 g/dL 1.3(L) 1.3(L) 1.3(L)  AST 15 - 41 U/L 132(H) 185(H) 308(H)  ALT 17 - 63 U/L 7(L) 9(L) 19  Alk Phosphatase 38 - 126 U/L 64 58 61  Total Bilirubin 0.3 - 1.2 mg/dL 0.4 0.5 0.5    Recent Labs Lab 01/13/17 0238 01/14/17 0416 01/15/17 0212  AST 308* 185* 132*  ALT 19 9* 7*  ALKPHOS 61 58 64  BILITOT 0.5 0.5 0.4  PROT 5.4* 5.6* 5.8*  ALBUMIN 1.3* 1.3* 1.3*    Recent  Labs Lab 01/09/17 0801 01/10/17 0818 01/11/17 0216 01/12/17 0916 01/13/17 0238  WBC 11.5* 10.2 9.9 10.5 8.1  NEUTROABS 9.3* 7.7 7.9*  --   --   HGB 9.4* 9.5* 9.1* 8.4* 7.9*  HCT 28.6* 29.7* 28.2* 26.6* 25.1*  MCV 93.5 94.0 94.9 96.0 95.4  PLT 461* 471* 468* 437* 404*    Recent Labs Lab 01/08/17 1056 01/08/17 1604 01/08/17 2227  TROPONINI 0.27* 0.51* 1.73*     Recent Labs Lab 01/14/17 0733 01/14/17 1125 01/14/17 1538 01/14/17 2145 01/15/17 0810  GLUCAP 316* 210* 84 212* 236*   Dg Chest 2 View  Result Date: 01/12/2017 CLINICAL DATA:  CHF EXAM: CHEST  2 VIEW COMPARISON:  01/03/2017 chest radiograph. FINDINGS: Intact sternotomy wires. Subcutaneous single lead left chest ICD is stable with lead tip overlying the medial left upper chest. Stable cardiomediastinal silhouette with mild cardiomegaly and aortic atherosclerosis. No pneumothorax. Trace bilateral pleural effusions. Cephalization of the pulmonary vasculature, with no overt pulmonary edema. Mild left basilar atelectasis. IMPRESSION: 1. Stable cardiomegaly without overt pulmonary edema. 2. Trace bilateral pleural effusions and mild left basilar atelectasis. Electronically Signed   By: Ilona Sorrel M.D.   On: 01/12/2017 10:12   US Abdomen Limited Ruq  Result Date: 01/11/2017 CLINICAL DATA:  Elevated LFTs. EXAM: ULTRASOUND ABDOMEN LIMITED RIGHT UPPER  QUADRANT COMPARISON:  12/19/2016 FINDINGS: Gallbladder: Evidence of sludge versus nonshadowing stones over the dependent portion of the gallbladder/neck. Largest measures 7 mm. No wall thickening or adjacent free fluid. Negative sonographic Murphy sign. Common bile duct: Diameter: 4.2 mm. Liver: No focal lesion identified. Within normal limits in parenchymal echogenicity. Portal vein is patent on color Doppler imaging with normal direction of blood flow towards the liver. IMPRESSION: Findings suggesting mild sludge versus small nonshadowing stones over the dependent portion of the gallbladder. No additional sonographic evidence to suggest cholecystitis. Electronically Signed   By: Marin Olp M.D.   On: 01/11/2017 21:50   Medications: Infusions: . sodium chloride 250 mL (01/14/17 1300)  . sodium chloride    . sodium chloride 10 mL/hr at 01/11/17 1005  . sodium chloride 10 mL/hr at 01/12/17 1756  . dialysis solution 2.5% low-MG/low-CA    . dialysis solution 4.25% low-MG/low-CA      Scheduled Medications: . aspirin  81 mg Oral Daily  . calcium acetate  667 mg Oral TID WC  . carvedilol  12.5 mg Oral QHS  . clopidogrel  75 mg Oral Q breakfast  . darbepoetin (ARANESP) injection - NON-DIALYSIS  200 mcg Subcutaneous Q Tue-1800  . docusate sodium  100 mg Oral BID  . feeding supplement (NEPRO CARB STEADY)  237 mL Oral BID BM  . feeding supplement (PRO-STAT SUGAR FREE 64)  30 mL Oral BID  . gentamicin cream  1 application Topical Daily  . heparin subcutaneous  5,000 Units Subcutaneous Q8H  . insulin aspart  0-9 Units Subcutaneous TID WC  . insulin NPH Human  50 Units Subcutaneous QHS  . midodrine  5 mg Oral BID WC  . multivitamin  1 tablet Oral QHS  . senna  1 tablet Oral BID  . sodium chloride flush  3 mL Intravenous Q12H  . sodium chloride flush  3 mL Intravenous Q12H  . Vitamin D (Ergocalciferol)  50,000 Units Oral Q30 days    Dialysis Orders:  [facility] [place] [facility]  [facility]. 6 exchanges/day one mid-day exchange (occurs around 9am).  2.5 liters of 2.5% per [facility]CPD 2 liters of 2.5% for last fill and mid  day drain. Pt says EDW "about 202" (pre admission) [name]  1. PAD 1. s/p open repair R common femoral artery pseudoaneurysm 12/15/16 ([facility]) and L TMA 12/23/16 Sharol Given).   2. Completed course fortaz and Dapto 3. S/p left BKA 10/3 Dr. Sharol Given 4. R small toe ulcer - local Rx 2. Chest pain/+ troponins.  1. Prior CABG. Stent to RCA 07/2016 d/t NSTEMI. ICD in place.  2. Recurrent CP/+ trops. Cath 10/1 no intervention required.  3. ESRD- CCPD. Altered regimen for ease in hospital (no pause or day bag)  1. Usually all 2.5% solutions at home, have adjusted here according to volume status 2. Used half of each  4.25/2.5 past 2 nights, weight down to 87 kg (pleural effusions 10/4 CXR). Will have new EDW 3. Will go back to all 2.5% solutions tonight and see if can maintain current weight (lungs entirely clear, peripheral edema resolved) 4. Orthostatic hypotension - midodrine 5. Anemia (ABLA)- TSAT 45% and Ferritin 2284 (9/22) 1. ^Aranesp 200 dosed 10/2 (max ESA) 2. Hb down to 7.9 yesterday - not repeated today 3. Transfuse as necessary for Hb < 7 (last transfusion was 9/9 2 units) 6. Hypokalemia: Monitor for need for replacement. Replete with 30 mEq po today 10/7 7. Myoclonus / Confusion - early in admisison (Attributed to gabapentin and robaxin -  stopped).  8. Secondary HPT - phoslo. Regular diet to promote nutrition 9. DM - per primary team 10. Abnormal transaminases - improving daily  11. Malnutrition - supplements (Nepro/prostat). Albumin 1.3... 12. Dispo - CIR when bed available   Jamal Maes, MD [facility] [phone] Pager 01/15/2017, 8:39 AM

## 2017-01-15 NOTE — Progress Notes (Signed)
Patient received transfer order and report called to 5N. Patient lifted from chair back to bed, and patient and belongings tranferred to 5N room 10. Dialysis RN in room before transfer and all equipment for PD moved to patients new room. Patient and wife didn't have any questions and transferred to 5N.  VSS before transfer.

## 2017-01-16 DIAGNOSIS — K5903 Drug induced constipation: Secondary | ICD-10-CM

## 2017-01-16 LAB — RENAL FUNCTION PANEL
ALBUMIN: 1.4 g/dL — AB (ref 3.5–5.0)
Anion gap: 16 — ABNORMAL HIGH (ref 5–15)
BUN: 61 mg/dL — AB (ref 6–20)
CO2: 25 mmol/L (ref 22–32)
CREATININE: 8.2 mg/dL — AB (ref 0.61–1.24)
Calcium: 8.5 mg/dL — ABNORMAL LOW (ref 8.9–10.3)
Chloride: 92 mmol/L — ABNORMAL LOW (ref 101–111)
GFR, EST AFRICAN AMERICAN: 7 mL/min — AB (ref >60)
GFR, EST NON AFRICAN AMERICAN: 6 mL/min — AB (ref >60)
Glucose, Bld: 145 mg/dL — ABNORMAL HIGH (ref 65–99)
PHOSPHORUS: 4.6 mg/dL (ref 2.5–4.6)
POTASSIUM: 3.7 mmol/L (ref 3.5–5.1)
Sodium: 133 mmol/L — ABNORMAL LOW (ref 135–145)

## 2017-01-16 LAB — GLUCOSE, CAPILLARY
GLUCOSE-CAPILLARY: 183 mg/dL — AB (ref 65–99)
Glucose-Capillary: 304 mg/dL — ABNORMAL HIGH (ref 65–99)
Glucose-Capillary: 272 mg/dL — ABNORMAL HIGH (ref 65–99)
Glucose-Capillary: 361 mg/dL — ABNORMAL HIGH (ref 65–99)

## 2017-01-16 LAB — HEPATITIS B SURFACE ANTIGEN: Hepatitis B Surface Ag: NEGATIVE

## 2017-01-16 MED ORDER — LACTULOSE 10 GM/15ML PO SOLN
30.0000 g | Freq: Four times a day (QID) | ORAL | Status: DC | PRN
  Administered 2017-01-16: 30 g via ORAL
  Filled 2017-01-16: qty 45

## 2017-01-16 MED ORDER — POLYETHYLENE GLYCOL 3350 17 G PO PACK
17.0000 g | PACK | Freq: Two times a day (BID) | ORAL | Status: DC
  Administered 2017-01-16: 17 g via ORAL
  Filled 2017-01-16 (×2): qty 1

## 2017-01-16 NOTE — Therapy (Signed)
Occupational Therapy Treatment Patient Details Name: [name] MRN: [id number] DOB: 1940-09-03 Today's Date: 01/16/2017    History of present illness Joshua Brandis Comptonis a 76 y.o.malewith hx of HTN, CVA, ESRD on PD, DM on insulin, COPD and CHFw AICD, hx CAD/ CABG 2011. Pt was admitted on 12/15/16 for non-healing L foot wound and R groin pseudoaneurysm, underwent repair of PA R groin and also resection of left 3rd metatarsal head by Dr Randie Heinz. However, the left foot progressed and developed gangrenous changes, so on 9/14 pt went for L transmetatarsal amputation by Dr Lajoyce Corners. pt admitted to rehab on 12/27/16. 10/3- left transtibial amputation    OT comments  Focus of today's session on functional mobility s/p left transtibial amputation. Pt presents with overall weakness and fatigue. Pt able to complete sit to stand x2 with max assist +2. Pt advised to continue UE theraband exercises to increase strength and activity tolerance. OT will continue to follow acutely to address established goals.     Follow Up Recommendations  CIR;Supervision/Assistance - 24 hour    Equipment Recommendations  None recommended by OT    Recommendations for Other Services      Precautions / Restrictions Precautions Precautions: Fall Restrictions Weight Bearing Restrictions: Yes LLE Weight Bearing: Non weight bearing       Mobility Bed Mobility Overal bed mobility: Needs Assistance Bed Mobility: Supine to Sit     Supine to sit: Mod assist Sit to supine: Min guard   General bed mobility comments: Mod assist to push up into seated position and increased time. Pt able to pull up on bed rails, with feet of bed elevated, to scoot up in bed.   Transfers Overall transfer level: Needs assistance Equipment used: Rolling walker (2 wheeled);2 person hand held assist Transfers: Sit to/from Stand Sit to Stand: Max assist;+2 physical assistance              Balance Overall balance assessment: Needs  assistance Sitting-balance support: Feet supported;Bilateral upper extremity supported Sitting balance-Leahy Scale: Poor Sitting balance - Comments: Pt with difficulty maintaining upright sitting posture. Left lateral lean   Standing balance support: Bilateral upper extremity supported Standing balance-Leahy Scale: Poor Standing balance comment: requires max A +2.  could not achieve full upright with pt flexed trunk                           ADL either performed or assessed with clinical judgement   ADL                             Toilet Transfer Details (indicate cue type and reason): Attempted transfer to [facility] but unable to complete due to weakness.          Functional mobility during ADLs: Maximal assistance;+2 for physical assistance General ADL Comments: Pt able to complete sit to stand transfer x 2 with max assist +2.      Vision   Vision Assessment?: No apparent visual deficits Additional Comments: Pt wears glasses   Perception     Praxis      Cognition Arousal/Alertness: Awake/alert Behavior During Therapy: Flat affect Overall Cognitive Status: Within Functional Limits for tasks assessed  Exercises     Shoulder Instructions       General Comments Pts wife present during session. Reviewed UE exercises from inpatient rehab. Encouraged pt to continue UE exercises to increase strength     Pertinent Vitals/ Pain       Pain Assessment: No/denies pain  Home Living                                          Prior Functioning/Environment              Frequency  Min 2X/week        Progress Toward Goals  OT Goals(current goals can now be found in the care plan section)  Progress towards OT goals: Progressing toward goals  Acute Rehab OT Goals Patient Stated Goal: To rehab then home  OT Goal Formulation: With patient/family Time For Goal Achievement:  01/26/17 Potential to Achieve Goals: Good ADL Goals Pt Will Perform Grooming: with set-up;sitting Pt Will Perform Upper Body Bathing: with supervision;with set-up;sitting Pt Will Perform Lower Body Bathing: with mod assist;sit to/from stand Pt Will Perform Lower Body Dressing: with min guard assist;with adaptive equipment;sit to/from stand Pt Will Transfer to Toilet: with mod assist;stand pivot transfer;bedside commode Pt Will Perform Toileting - Clothing Manipulation and hygiene: with supervision;sit to/from stand Pt/caregiver will Perform Home Exercise Program: Increased strength;Both right and left upper extremity;With theraband;With written HEP provided;With Supervision Additional ADL Goal #1: Pt will be Mod I in and OOB for basic ADLs  Plan Discharge plan remains appropriate    Co-evaluation                 AM-PAC PT "6 Clicks" Daily Activity     Outcome Measure   Help from another person eating meals?: A Little Help from another person taking care of personal grooming?: A Little Help from another person toileting, which includes using toliet, bedpan, or urinal?: Total Help from another person bathing (including washing, rinsing, drying)?: A Lot Help from another person to put on and taking off regular upper body clothing?: A Lot Help from another person to put on and taking off regular lower body clothing?: Total 6 Click Score: 12    End of Session Equipment Utilized During Treatment: Gait belt;Rolling walker  OT Visit Diagnosis: Unsteadiness on feet (R26.81)   Activity Tolerance Patient limited by fatigue   Patient Left in bed;with call bell/phone within reach;with bed alarm set;with family/visitor present   Nurse Communication Mobility status;Need for lift equipment (Need for stedy during transfers. )        Time: 1610-9604 OT Time Calculation (min): 31 min  Charges:    [name], OTS [phone]    [name] 01/16/2017, 4:23 PM

## 2017-01-16 NOTE — Progress Notes (Signed)
OT Note - Addendum    01/16/17 1800  OT Visit Information  Last OT Received On 01/16/17  OT Time Calculation  OT Start Time (ACUTE ONLY) 1526  OT Stop Time (ACUTE ONLY) 1557  OT Time Calculation (min) 31 min  OT General Charges  $OT Visit 1 Visit  OT Treatments  $Self Care/Home Management  23-37 mins  [facility], OT/L  [phone] 01/16/2017

## 2017-01-16 NOTE — Progress Notes (Signed)
PT Cancellation Note  Patient Details Name: [name] MRN: [id number] DOB: June 19, 1940   Cancelled Treatment:    Reason Eval/Treat Not Completed: Patient at procedure or test/unavailable. Pt undergoing peritoneal dialysis at bedside. Acute PT to return as able.   Kemberly Taves M Davonda Ausley 01/16/2017, 8:55 AM   Lewis Shock, PT, DPT Pager #: [phone] Office #: [phone]

## 2017-01-16 NOTE — Care Management Important Message (Signed)
Important Message  Patient Details  Name: [name] MRN: [id number] Date of Birth: 02/23/1941   Medicare Important Message Given:  Yes    [name] Abena 01/16/2017, 8:45 AM

## 2017-01-16 NOTE — Progress Notes (Signed)
PROGRESS NOTE    [name]  ZOX:[id number] DOB: January 02, 1941 DOA: 01/06/2017 PCP: Dorice [name], MD   Brief Narrative: [name] is a 76 y.o. malewith hx of HTN, CVA, ESRD on PD, DM on insulin, COPD and CHFw AICD, hx CAD/ CABG 2011. Pt was admitted on 12/15/16 for non-healing L foot wound and R groin pseudoaneurysm, underwent repair of PA R groin and also resection of left 3rd metatarsal head by Dr Randie Heinz. However, the left foot progressed and developed gangrenous changes, so on 9/14 pt went for L transmetatarsal amputation by Dr Lajoyce Corners. Post op abx were stopped and pt admitted to rehab on 12/27/16.   On 9/23 pt had fevers and persistent WBC and tachycardia. Started on [place] due to concerns of possible infection. UcX and Blood cx's were negative. CXR was negative. Pt had increasing L foot pain w/ some skin changes/ blistering and serous drainage from L TMA wound. Dr Lajoyce Corners was consulted and per the chart, possible plan is for surgical intervention next week. Discussed with Dr. Lajoyce Corners and will see the patient Monday. C/w Abx for now.   Patient developed Chest Pain on 9/30 so he was given full dose ASA, SL NTG, and a Stat EKG and Troponin was ordered. EKG reviewed and showed significant ST Depressions (agreed by Cardiologist Dr. Donnie Aho) so Cardiology was consulted and patient was placed on a NTG gtt and Heparin gtt and transferred to SDU. Patient underwent catheterization and required no stenting and there was no critical stenosis identified. Cardiology cleared the patient and plan is for Left BKA in AM.    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 Wound vac in place. Antibiotics discontinued -orthopedic surgery recommendations: wound vac x1 week, staples out x2 weeks  Elevated AST/ALT Asymptomatic. Recent ultrasound significant for gallbladder polyp. RUQ ultrasound on 10/3 significant for sludge and shadow. Continues to trend down.  Acute chest pain NSTEMI S/p cath without critical stenosis. Cardiology recommended okay for surgery. Nitroglycerin and heparin drip discontinued. -continue Plavix and Aspirin  ESRD on PD -nephrology recommendations  History of CVA -continue aspirin and Plavix -holding atorvastatin with elevated transaminases  PVD S/p left peroneal artery revascularization. -continue aspirin and Plavix  Anemia of CKD Stable  Hypotension -continue Midodrine  CAD Hx of CABG ICD -continue carvedilol, aspirin and Plavix -isosorbide mononitrate discontinued by cardiology -atorvastatin on hold secondary to elevated transaminases. Restart when improved.  Hypokalemia Hypomagensemia -Replete as needed  COPD No exacerbation. Stable.  Chronic systolic CHF No exacerbation. ICD in place. Last EF of 45% -continue strict I/O, daily weights  Hyponatremia Chronic. Stable.  Right toe ulcer Chronic. Patient has had successful revascularization of the right limb. No evidence of erythema or purulent drainage to suggest infection.  -defer to wound care/orthopedic surgery with regard to wound care/possible debridement  Constipation Likely opiate induced. Passing gas. No abdominal pain. -schedule Miralax -continue Senna and Colace   DVT prophylaxis: subq heparin  Code Status: Full code Family Communication: Wife at bedside Disposition Plan: CIR   Consultants:   Orthopedic  surgery  Nephrology  Cardiology  Procedures:  CARDIAC CATHETERIZATION  Ost RCA to Prox RCA lesion, 0 %stenosed.  LM lesion, 100 %stenosed.  LIMA.  SVG.  Origin lesion, 100 %stenosed.  SVG.  Dist Graft to Insertion lesion, 40 %stenosed.  1st Mrg lesion, 75 %stenosed.  Ost Cx to Prox Cx lesion, 80 %stenosed.  Ost 3rd Mrg lesion, 100 %stenosed.  Ost 1st Mrg lesion, 100 %stenosed.  Mid Cx lesion, 60 %stenosed.  Antimicrobials:  Ceftazidime  Daptomycin     Subjective: No concerns today  Objective: Vitals:   01/15/17 1624 01/15/17 1929 01/15/17 1953 01/16/17 0500  BP:  (!) 153/57 (!) 132/56 (!) 144/74  Pulse:  100 93 76  Resp:  (!) 25  (!) 22  Temp: 98 F (36.7 C) 98.1 F (36.7 C) 97.7 F (36.5 C) 98.2 F (36.8 C)  TempSrc: Oral Oral Oral Oral  SpO2:  97% 97% 98%  Weight:    91 kg (200 lb 9.9 oz)  Height:        Intake/Output Summary (Last 24 hours) at 01/16/17 1249 Last data filed at 01/16/17 0827  Gross per 24 hour  Intake              120 ml  Output                0 ml  Net              120 ml   Filed Weights   01/14/17 1715 01/15/17 0500 01/16/17 0500  Weight: 88.3 kg (194 lb 10.7 oz) 87 kg (191 lb 12.8 oz) 91 kg (200 lb 9.9 oz)    Examination:  General exam: Appears calm and comfortable Respiratory system: Clear to auscultation bilaterally. Unlabored work of breathing. No wheezing or rales. Cardiovascular system: S1 & S2 heard, RRR. No murmurs. Right DP palpable. Gastrointestinal system: Abdomen is distended, soft and nontender. Normal bowel sounds heard. Central nervous system: Alert and oriented. No focal neurological deficits. Extremities: No edema. No calf tenderness. Left BKA. Skin: No cyanosis. Right 5th digit ulcer wrapped. Psychiatry: Judgement and insight appear normal. Mood & affect appropriate.     Data Reviewed: I have personally reviewed following labs and imaging studies  CBC:  Recent Labs Lab 01/10/17 0818  01/11/17 0216 01/12/17 0916 01/13/17 0238 01/15/17 0802  WBC 10.2 9.9 10.5 8.1 11.2*  NEUTROABS 7.7 7.9*  --   --   --   HGB 9.5* 9.1* 8.4* 7.9* 7.7*  HCT 29.7* 28.2* 26.6* 25.1* 24.5*  MCV 94.0 94.9 96.0 95.4 96.5  PLT 471* 468* 437* 404* 436*   Basic Metabolic Panel:  Recent Labs Lab 01/10/17 0123 01/10/17 0818 01/11/17 0216 01/12/17 0916 01/13/17 0238 01/14/17 0416 01/15/17 0212 01/16/17 0444  NA 130*  --  128* 130* 130* 130* 132* 133*  K 3.4*  --  4.9 3.7 3.7 3.6 3.3* 3.7  CL 95*  --  90* 92* 91* 92* 91* 92*  CO2 24  --  GLUCOSE 188*  --  273* 335* 360* 328* 348* 145*  BUN 50*  --  47* 46* 46* 49* 50* 61*  CREATININE 6.36*  --  6.14* 7.00* 6.94* 7.26* 7.30* 8.20*  CALCIUM 8.0*  --  8.1* 7.9* 7.9* 8.1* 8.5* 8.5*  MG  --  1.8 2.0  --   --   --   --   --  PHOS 4.0 4.0 5.0*  --  5.4*  --   --  4.6   GFR: Estimated Creatinine Clearance: 8.7 mL/min (A) (by C-G formula based on SCr of 8.2 mg/dL (H)). Liver Function Tests:  Recent Labs Lab 01/11/17 0216 01/12/17 0916 01/13/17 0238 01/14/17 0416 01/15/17 0212 01/16/17 0444  AST 566* 437* 308* 185* 132*  --   ALT 105* 52 19 9* 7*  --   ALKPHOS 67 60 61 58 64  --   BILITOT 2.2* 0.3 0.5 0.5 0.4  --   PROT 5.6* 5.6* 5.4* 5.6* 5.8*  --   ALBUMIN 1.4* 1.3* 1.3* 1.3* 1.3* 1.4*   No results for input(s): LIPASE, AMYLASE in the last 168 hours. No results for input(s): AMMONIA in the last 168 hours. Coagulation Profile:  Recent Labs Lab 01/09/17 1424  INR 1.20   Cardiac Enzymes: No results for input(s): CKTOTAL, CKMB, CKMBINDEX, TROPONINI in the last 168 hours. BNP (last 3 results) No results for input(s): PROBNP in the last 8760 hours. HbA1C: No results for input(s): HGBA1C in the last 72 hours. CBG:  Recent Labs Lab 01/15/17 1206 01/15/17 1623 01/15/17 2127 01/16/17 0611 01/16/17 1134  GLUCAP 216* 186* 140* 183* 304*   Lipid Profile: No results for input(s): CHOL, HDL, LDLCALC,  TRIG, CHOLHDL, LDLDIRECT in the last 72 hours. Thyroid Function Tests: No results for input(s): TSH, T4TOTAL, FREET4, T3FREE, THYROIDAB in the last 72 hours. Anemia Panel: No results for input(s): VITAMINB12, FOLATE, FERRITIN, TIBC, IRON, RETICCTPCT in the last 72 hours. Sepsis Labs: No results for input(s): PROCALCITON, LATICACIDVEN in the last 168 hours.  Recent Results (from the past 240 hour(s))  MRSA PCR Screening     Status: None   Collection Time: 01/08/17  4:15 PM  Result Value Ref Range Status   MRSA by PCR NEGATIVE NEGATIVE Final    Comment:        The GeneXpert MRSA Assay (FDA approved for NASAL specimens only), is one component of a comprehensive MRSA colonization surveillance program. It is not intended to diagnose MRSA infection nor to guide or monitor treatment for MRSA infections.   Surgical pcr screen     Status: None   Collection Time: 01/11/17  4:00 AM  Result Value Ref Range Status   MRSA, PCR NEGATIVE NEGATIVE Final   Staphylococcus aureus NEGATIVE NEGATIVE Final    Comment: (NOTE) The Xpert SA Assay (FDA approved for NASAL specimens in patients 26 years of age and older), is one component of a comprehensive surveillance program. It is not intended to diagnose infection nor to guide or monitor treatment.          Radiology Studies: No results found.      Scheduled Meds: . aspirin  81 mg Oral Daily  . calcium acetate  667 mg Oral TID WC  . carvedilol  12.5 mg Oral QHS  . clopidogrel  75 mg Oral Q breakfast  . darbepoetin (ARANESP) injection - NON-DIALYSIS  200 mcg Subcutaneous Q Tue-1800  . docusate sodium  100 mg Oral BID  . feeding supplement (NEPRO CARB STEADY)  237 mL Oral BID BM  . feeding supplement (PRO-STAT SUGAR FREE 64)  30 mL Oral BID  . gentamicin cream  1 application Topical Daily  . heparin subcutaneous  5,000 Units Subcutaneous Q8H  . insulin aspart  0-9 Units Subcutaneous TID WC  . insulin NPH Human  50 Units Subcutaneous  QHS  . midodrine  5 mg Oral BID WC  . multivitamin  1 tablet Oral QHS  . polyethylene glycol  17 g Oral BID  . senna  1 tablet Oral BID  . sodium chloride flush  3 mL Intravenous Q12H  . sodium chloride flush  3 mL Intravenous Q12H  . Vitamin D (Ergocalciferol)  50,000 Units Oral Q30 days   Continuous Infusions: . sodium chloride    . sodium chloride 10 mL/hr at 01/11/17 1005  . dialysis solution 2.5% low-MG/low-CA       LOS: 10 days     Jacquelin Hawking, MD [facility] 01/16/2017, 12:49 PM Pager: [phone]  If 7PM-7AM, please contact night-coverage [email or web address] Password [email or web address] 01/16/2017, 12:49 PM

## 2017-01-16 NOTE — Progress Notes (Signed)
[facility] ROUNDING NOTE   Subjective:   Interval History:   76 y.o. malewith hx of HTN, CVA, ESRD on PD, DM on insulin, COPD and CHFw AICD, hx CAD/ CABG 2011. Pt was admitted on 12/15/16 for non-healing L foot wound and R groin pseudoaneurysm, underwent repair of PA R groin and also resection of left 3rd metatarsal head by Dr Randie Heinz. However, the left foot progressed and developed gangrenous changes, so on 9/14 pt went for L transmetatarsal amputation by Dr Lajoyce Corners.He had a cardiac catheterization demonstrating no critical stenosis and cleared for surgery He continues on peritoneal dialysis(home = 5 2.5 L fills, all 2.5% dialysate, 10 min/1.5 hours/20 min fill/dwell/drain cycle with pause and daytime dwell; altered in the hospital for ease, doing fine)    Objective:  Vital signs in last 24 hours:  Temp:  [97.7 F (36.5 C)-98.2 F (36.8 C)] 98.2 F (36.8 C) (10/08 0500) Pulse Rate:  [76-100] 76 (10/08 0500) Resp:  [22-25] 22 (10/08 0500) BP: (132-153)/(56-74) 144/74 (10/08 0500) SpO2:  [97 %-98 %] 98 % (10/08 0500) Weight:  [200 lb 9.9 oz (91 kg)] 200 lb 9.9 oz (91 kg) (10/08 0500)  Weight change: 4 lb 0.3 oz (1.823 kg) Filed Weights   01/14/17 1715 01/15/17 0500 01/16/17 0500  Weight: 194 lb 10.7 oz (88.3 kg) 191 lb 12.8 oz (87 kg) 200 lb 9.9 oz (91 kg)    Intake/Output: I/O last 3 completed shifts: In: 203 [P.O.:200; I.V.:3] Out: 0    Intake/Output this shift:  Total I/O In: 120 [P.O.:120] Out: -   CVS RRR Lungs diminished at bases otherwise clear anteriorly Abdomen +BS, soft and not tender. PD catheter left abd with dry dressings Trace -1+ RLE edema L TMA wrapped and some LLE edema Dialysis Access: PD cath, left upper arm AVF   Basic Metabolic Panel:  Recent Labs Lab 01/10/17 0123 01/10/17 0818 01/11/17 0216 01/12/17 9811 01/13/17 0238 01/14/17 0416 01/15/17 0212 01/16/17 0444  NA 130*  --  128* 130* 130* 130* 132* 133*  K 3.4*  --  4.9 3.7  3.7 3.6 3.3* 3.7  CL 95*  --  90* 92* 91* 92* 91* 92*  CO2 24  --  GLUCOSE 188*  --  273* 335* 360* 328* 348* 145*  BUN 50*  --  47* 46* 46* 49* 50* 61*  CREATININE 6.36*  --  6.14* 7.00* 6.94* 7.26* 7.30* 8.20*  CALCIUM 8.0*  --  8.1* 7.9* 7.9* 8.1* 8.5* 8.5*  MG  --  1.8 2.0  --   --   --   --   --   PHOS 4.0 4.0 5.0*  --  5.4*  --   --  4.6    Liver Function Tests:  Recent Labs Lab 01/11/17 0216 01/12/17 0916 01/13/17 0238 01/14/17 0416 01/15/17 0212 01/16/17 0444  AST 566* 437* 308* 185* 132*  --   ALT 105* 52 19 9* 7*  --   ALKPHOS 67 60 61 58 64  --   BILITOT 2.2* 0.3 0.5 0.5 0.4  --   PROT 5.6* 5.6* 5.4* 5.6* 5.8*  --   ALBUMIN 1.4* 1.3* 1.3* 1.3* 1.3* 1.4*   No results for input(s): LIPASE, AMYLASE in the last 168 hours. No results for input(s): AMMONIA in the last 168 hours.  CBC:  Recent Labs Lab 01/10/17 0818 01/11/17 0216 01/12/17 0916 01/13/17 0238 01/15/17 0802  WBC 10.2 9.9 10.5 8.1 11.2*  NEUTROABS  7.7 7.9*  --   --   --   HGB 9.5* 9.1* 8.4* 7.9* 7.7*  HCT 29.7* 28.2* 26.6* 25.1* 24.5*  MCV 94.0 94.9 96.0 95.4 96.5  PLT 471* 468* 437* 404* 436*    Cardiac Enzymes: No results for input(s): CKTOTAL, CKMB, CKMBINDEX, TROPONINI in the last 168 hours.  BNP: Invalid input(s): POCBNP  CBG:  Recent Labs Lab 01/15/17 0810 01/15/17 1206 01/15/17 1623 01/15/17 2127 01/16/17 0611  GLUCAP 236* 216* 186* 140* 183*    Microbiology: Results for orders placed or performed during the hospital encounter of 01/06/17  MRSA PCR Screening     Status: None   Collection Time: 01/08/17  4:15 PM  Result Value Ref Range Status   MRSA by PCR NEGATIVE NEGATIVE Final    Comment:        The GeneXpert MRSA Assay (FDA approved for NASAL specimens only), is one component of a comprehensive MRSA colonization surveillance program. It is not intended to diagnose MRSA infection nor to guide or monitor treatment for MRSA infections.    Surgical pcr screen     Status: None   Collection Time: 01/11/17  4:00 AM  Result Value Ref Range Status   MRSA, PCR NEGATIVE NEGATIVE Final   Staphylococcus aureus NEGATIVE NEGATIVE Final    Comment: (NOTE) The Xpert SA Assay (FDA approved for NASAL specimens in patients 5 years of age and older), is one component of a comprehensive surveillance program. It is not intended to diagnose infection nor to guide or monitor treatment.     Coagulation Studies: No results for input(s): LABPROT, INR in the last 72 hours.  Urinalysis: No results for input(s): COLORURINE, LABSPEC, PHURINE, GLUCOSEU, HGBUR, BILIRUBINUR, KETONESUR, PROTEINUR, UROBILINOGEN, NITRITE, LEUKOCYTESUR in the last 72 hours.  Invalid input(s): APPERANCEUR    Imaging: No results found.   Medications:   . sodium chloride    . sodium chloride 10 mL/hr at 01/11/17 1005  . dialysis solution 2.5% low-MG/low-CA     . aspirin  81 mg Oral Daily  . calcium acetate  667 mg Oral TID WC  . carvedilol  12.5 mg Oral QHS  . clopidogrel  75 mg Oral Q breakfast  . darbepoetin (ARANESP) injection - NON-DIALYSIS  200 mcg Subcutaneous Q Tue-1800  . docusate sodium  100 mg Oral BID  . feeding supplement (NEPRO CARB STEADY)  237 mL Oral BID BM  . feeding supplement (PRO-STAT SUGAR FREE 64)  30 mL Oral BID  . gentamicin cream  1 application Topical Daily  . heparin subcutaneous  5,000 Units Subcutaneous Q8H  . insulin aspart  0-9 Units Subcutaneous TID WC  . insulin NPH Human  50 Units Subcutaneous QHS  . midodrine  5 mg Oral BID WC  . multivitamin  1 tablet Oral QHS  . polyethylene glycol  17 g Oral BID  . senna  1 tablet Oral BID  . sodium chloride flush  3 mL Intravenous Q12H  . sodium chloride flush  3 mL Intravenous Q12H  . Vitamin D (Ergocalciferol)  50,000 Units Oral Q30 days   sodium chloride, acetaminophen **OR** acetaminophen, bisacodyl, dianeal solution for CAPD/CCPD with heparin, hydrALAZINE, metoCLOPramide  **OR** metoCLOPramide (REGLAN) injection, morphine injection, nitroGLYCERIN, ondansetron **OR** ondansetron (ZOFRAN) IV, oxyCODONE-acetaminophen, sodium chloride flush, sodium chloride flush  Assessment/ Plan:  1. PAD- s/p open repair of R common femoral artery pseudoaneurysm (VVS) and L transmet  12/23/16 (Dr. Lajoyce Corners).  On Fortaz and dapto - will now need BKA and cardiology has  cleared 2. Chest pain/+ troponins. Prior CABG. Had stent to RCA 07/2016 d/t NSTEMI. Has ICD in place. Recurrent CP/+ trops. Cath 10/1 no intervention required.  3. ESRD- CCPD, using 2.5% dextrose at home; Altered regimen for ease in hospital (no pause or day bag) Had been using 1/2 and 1/2 past few days  Using all 2.5 % bags for now BP controlled for now  4. Orthostatic hypotension - midodrine 5. Anemia (ABLA)- TSAT 45% and Ferritin 2284, ^Aranesp 200 qThurs. Not given yet this admission that I can see (ordered "with HD" which may be the issue). Reordered SQ for today. 6. Constipation: miralax as needed 7. Hypokalemia: Monitor for need for replacement. Give 30 po today.  8. Myoclonus / Confusion: stopped gabapentin and robaxin. Not confused past 2 days that I can see 9. Secondary HPT - phoslo. Regular diet to promote nutrition 10. DM - per primary team 10. Low grade temp- blood cultures neg to date and fortaz/dapto-  fluid cell count fine - foot thought to be source- will need BKA.     LOS: 10 Yakira Duquette W  :15 AM

## 2017-01-16 NOTE — Progress Notes (Signed)
PT Cancellation Note  Patient Details Name: [name] MRN: [id number] DOB: December 26, 1940   Cancelled Treatment:    Reason Eval/Treat Not Completed: Patient at procedure or test/unavailable. Pt remains hooked up to peritoneal dialysis. RN called down to Dialysis team, unsure when he will be done.  Acute PT to return as able.   Manvir Thorson M Margarito Dehaas 01/16/2017, 2:13 PM   Lewis Shock, PT, DPT Pager #: [phone] Office #: [phone]

## 2017-01-17 ENCOUNTER — Inpatient Hospital Stay (HOSPITAL_COMMUNITY)
Admission: RE | Admit: 2017-01-17 | Discharge: 2017-02-07 | DRG: 503 | Disposition: A | Discharge status: Home Health | Payer: Medicare Other | Source: Intra-hospital | Attending: Physical Medicine & Rehabilitation | Admitting: Physical Medicine & Rehabilitation

## 2017-01-17 ENCOUNTER — Encounter (HOSPITAL_COMMUNITY): Payer: Self-pay

## 2017-01-17 DIAGNOSIS — Z992 Dependence on renal dialysis: Secondary | ICD-10-CM

## 2017-01-17 DIAGNOSIS — M3500 Sicca syndrome, unspecified: Secondary | ICD-10-CM | POA: Diagnosis present

## 2017-01-17 DIAGNOSIS — Z951 Presence of aortocoronary bypass graft: Secondary | ICD-10-CM

## 2017-01-17 DIAGNOSIS — E1122 Type 2 diabetes mellitus with diabetic chronic kidney disease: Secondary | ICD-10-CM | POA: Diagnosis present

## 2017-01-17 DIAGNOSIS — I70245 Atherosclerosis of native arteries of left leg with ulceration of other part of foot: Secondary | ICD-10-CM | POA: Diagnosis not present

## 2017-01-17 DIAGNOSIS — D638 Anemia in other chronic diseases classified elsewhere: Secondary | ICD-10-CM | POA: Diagnosis not present

## 2017-01-17 DIAGNOSIS — N186 End stage renal disease: Secondary | ICD-10-CM | POA: Diagnosis present

## 2017-01-17 DIAGNOSIS — E871 Hypo-osmolality and hyponatremia: Secondary | ICD-10-CM | POA: Diagnosis present

## 2017-01-17 DIAGNOSIS — E11649 Type 2 diabetes mellitus with hypoglycemia without coma: Secondary | ICD-10-CM | POA: Diagnosis not present

## 2017-01-17 DIAGNOSIS — I97638 Postprocedural hematoma of a circulatory system organ or structure following other circulatory system procedure: Secondary | ICD-10-CM | POA: Diagnosis present

## 2017-01-17 DIAGNOSIS — Z95810 Presence of automatic (implantable) cardiac defibrillator: Secondary | ICD-10-CM

## 2017-01-17 DIAGNOSIS — I214 Non-ST elevation (NSTEMI) myocardial infarction: Secondary | ICD-10-CM | POA: Diagnosis present

## 2017-01-17 DIAGNOSIS — Z794 Long term (current) use of insulin: Secondary | ICD-10-CM | POA: Diagnosis not present

## 2017-01-17 DIAGNOSIS — D72829 Elevated white blood cell count, unspecified: Secondary | ICD-10-CM | POA: Diagnosis not present

## 2017-01-17 DIAGNOSIS — E11621 Type 2 diabetes mellitus with foot ulcer: Secondary | ICD-10-CM | POA: Diagnosis present

## 2017-01-17 DIAGNOSIS — I739 Peripheral vascular disease, unspecified: Secondary | ICD-10-CM | POA: Diagnosis not present

## 2017-01-17 DIAGNOSIS — E109 Type 1 diabetes mellitus without complications: Secondary | ICD-10-CM | POA: Diagnosis not present

## 2017-01-17 DIAGNOSIS — IMO0001 Reserved for inherently not codable concepts without codable children: Secondary | ICD-10-CM

## 2017-01-17 DIAGNOSIS — G253 Myoclonus: Secondary | ICD-10-CM | POA: Diagnosis present

## 2017-01-17 DIAGNOSIS — Z8673 Personal history of transient ischemic attack (TIA), and cerebral infarction without residual deficits: Secondary | ICD-10-CM | POA: Diagnosis not present

## 2017-01-17 DIAGNOSIS — D62 Acute posthemorrhagic anemia: Secondary | ICD-10-CM | POA: Diagnosis present

## 2017-01-17 DIAGNOSIS — E1151 Type 2 diabetes mellitus with diabetic peripheral angiopathy without gangrene: Secondary | ICD-10-CM | POA: Diagnosis present

## 2017-01-17 DIAGNOSIS — Z87891 Personal history of nicotine dependence: Secondary | ICD-10-CM

## 2017-01-17 DIAGNOSIS — K5900 Constipation, unspecified: Secondary | ICD-10-CM | POA: Diagnosis present

## 2017-01-17 DIAGNOSIS — J449 Chronic obstructive pulmonary disease, unspecified: Secondary | ICD-10-CM | POA: Diagnosis present

## 2017-01-17 DIAGNOSIS — Z89512 Acquired absence of left leg below knee: Secondary | ICD-10-CM | POA: Diagnosis not present

## 2017-01-17 DIAGNOSIS — E1142 Type 2 diabetes mellitus with diabetic polyneuropathy: Secondary | ICD-10-CM | POA: Diagnosis present

## 2017-01-17 DIAGNOSIS — I953 Hypotension of hemodialysis: Secondary | ICD-10-CM | POA: Diagnosis not present

## 2017-01-17 DIAGNOSIS — I252 Old myocardial infarction: Secondary | ICD-10-CM

## 2017-01-17 DIAGNOSIS — S88111S Complete traumatic amputation at level between knee and ankle, right lower leg, sequela: Secondary | ICD-10-CM

## 2017-01-17 DIAGNOSIS — E639 Nutritional deficiency, unspecified: Secondary | ICD-10-CM

## 2017-01-17 DIAGNOSIS — I132 Hypertensive heart and chronic kidney disease with heart failure and with stage 5 chronic kidney disease, or end stage renal disease: Secondary | ICD-10-CM | POA: Diagnosis present

## 2017-01-17 DIAGNOSIS — K649 Unspecified hemorrhoids: Secondary | ICD-10-CM | POA: Diagnosis present

## 2017-01-17 DIAGNOSIS — Z6828 Body mass index (BMI) 28.0-28.9, adult: Secondary | ICD-10-CM

## 2017-01-17 DIAGNOSIS — T8781 Dehiscence of amputation stump: Secondary | ICD-10-CM | POA: Diagnosis not present

## 2017-01-17 DIAGNOSIS — K921 Melena: Secondary | ICD-10-CM | POA: Diagnosis present

## 2017-01-17 DIAGNOSIS — F4321 Adjustment disorder with depressed mood: Secondary | ICD-10-CM | POA: Diagnosis not present

## 2017-01-17 DIAGNOSIS — I2510 Atherosclerotic heart disease of native coronary artery without angina pectoris: Secondary | ICD-10-CM | POA: Diagnosis present

## 2017-01-17 DIAGNOSIS — E46 Unspecified protein-calorie malnutrition: Secondary | ICD-10-CM | POA: Diagnosis present

## 2017-01-17 DIAGNOSIS — I509 Heart failure, unspecified: Secondary | ICD-10-CM | POA: Diagnosis present

## 2017-01-17 DIAGNOSIS — Z4781 Encounter for orthopedic aftercare following surgical amputation: Principal | ICD-10-CM

## 2017-01-17 DIAGNOSIS — L97519 Non-pressure chronic ulcer of other part of right foot with unspecified severity: Secondary | ICD-10-CM | POA: Diagnosis present

## 2017-01-17 DIAGNOSIS — E119 Type 2 diabetes mellitus without complications: Secondary | ICD-10-CM

## 2017-01-17 DIAGNOSIS — M79671 Pain in right foot: Secondary | ICD-10-CM | POA: Diagnosis not present

## 2017-01-17 DIAGNOSIS — I959 Hypotension, unspecified: Secondary | ICD-10-CM

## 2017-01-17 DIAGNOSIS — L89813 Pressure ulcer of head, stage 3: Secondary | ICD-10-CM | POA: Diagnosis present

## 2017-01-17 DIAGNOSIS — R410 Disorientation, unspecified: Secondary | ICD-10-CM

## 2017-01-17 DIAGNOSIS — E669 Obesity, unspecified: Secondary | ICD-10-CM | POA: Diagnosis present

## 2017-01-17 DIAGNOSIS — E876 Hypokalemia: Secondary | ICD-10-CM | POA: Diagnosis present

## 2017-01-17 DIAGNOSIS — E1169 Type 2 diabetes mellitus with other specified complication: Secondary | ICD-10-CM | POA: Diagnosis not present

## 2017-01-17 DIAGNOSIS — T8131XA Disruption of external operation (surgical) wound, not elsewhere classified, initial encounter: Secondary | ICD-10-CM

## 2017-01-17 DIAGNOSIS — I951 Orthostatic hypotension: Secondary | ICD-10-CM | POA: Diagnosis not present

## 2017-01-17 DIAGNOSIS — Z833 Family history of diabetes mellitus: Secondary | ICD-10-CM

## 2017-01-17 HISTORY — DX: Adverse effect of unspecified anesthetic, initial encounter: T41.45XA

## 2017-01-17 HISTORY — DX: Other specified postprocedural states: R11.2

## 2017-01-17 HISTORY — DX: Other specified postprocedural states: Z98.890

## 2017-01-17 HISTORY — DX: Other complications of anesthesia, initial encounter: T88.59XA

## 2017-01-17 LAB — CBC
HCT: 26.2 % — ABNORMAL LOW (ref 39.0–52.0)
Hemoglobin: 8.3 g/dL — ABNORMAL LOW (ref 13.0–17.0)
MCH: 30.9 pg (ref 26.0–34.0)
MCHC: 31.7 g/dL (ref 30.0–36.0)
MCV: 97.4 fL (ref 78.0–100.0)
PLATELETS: 501 10*3/uL — AB (ref 150–400)
RBC: 2.69 MIL/uL — AB (ref 4.22–5.81)
RDW: 16.5 % — AB (ref 11.5–15.5)
WBC: 12.8 10*3/uL — ABNORMAL HIGH (ref 4.0–10.5)

## 2017-01-17 LAB — GLUCOSE, CAPILLARY
GLUCOSE-CAPILLARY: 387 mg/dL — AB (ref 65–99)
GLUCOSE-CAPILLARY: 229 mg/dL — AB (ref 65–99)
GLUCOSE-CAPILLARY: 342 mg/dL — AB (ref 65–99)
Glucose-Capillary: 309 mg/dL — ABNORMAL HIGH (ref 65–99)

## 2017-01-17 MED ORDER — PREMIER PROTEIN SHAKE
2.0000 [oz_av] | Freq: Three times a day (TID) | ORAL | Status: DC
  Administered 2017-01-18 – 2017-02-06 (×44): 2 [oz_av] via ORAL
  Filled 2017-01-17 (×86): qty 325.31

## 2017-01-17 MED ORDER — CLOPIDOGREL BISULFATE 75 MG PO TABS
75.0000 mg | ORAL_TABLET | Freq: Every day | ORAL | Status: DC
  Administered 2017-01-18 – 2017-02-07 (×20): 75 mg via ORAL
  Filled 2017-01-17 (×20): qty 1

## 2017-01-17 MED ORDER — MIDODRINE HCL 5 MG PO TABS
5.0000 mg | ORAL_TABLET | Freq: Two times a day (BID) | ORAL | Status: DC
  Administered 2017-01-18 – 2017-01-24 (×11): 5 mg via ORAL
  Filled 2017-01-17 (×12): qty 1

## 2017-01-17 MED ORDER — ONDANSETRON HCL 4 MG PO TABS
4.0000 mg | ORAL_TABLET | Freq: Four times a day (QID) | ORAL | Status: DC | PRN
  Administered 2017-01-26: 4 mg via ORAL
  Filled 2017-01-17 (×2): qty 1

## 2017-01-17 MED ORDER — ACETAMINOPHEN 325 MG PO TABS
325.0000 mg | ORAL_TABLET | ORAL | Status: DC | PRN
  Administered 2017-01-17 – 2017-02-06 (×26): 650 mg via ORAL
  Filled 2017-01-17 (×26): qty 2

## 2017-01-17 MED ORDER — NITROGLYCERIN 0.4 MG SL SUBL: 0.4000 mg | SUBLINGUAL_TABLET | SUBLINGUAL | Status: DC | PRN

## 2017-01-17 MED ORDER — INSULIN NPH (HUMAN) (ISOPHANE) 100 UNIT/ML ~~LOC~~ SUSP
40.0000 [IU] | Freq: Every day | SUBCUTANEOUS | Status: DC
  Administered 2017-01-17: 40 [IU] via SUBCUTANEOUS
  Filled 2017-01-17: qty 10

## 2017-01-17 MED ORDER — ASPIRIN 81 MG PO CHEW
81.0000 mg | CHEWABLE_TABLET | Freq: Every day | ORAL | Status: DC
  Administered 2017-01-18 – 2017-02-07 (×20): 81 mg via ORAL
  Filled 2017-01-17 (×20): qty 1

## 2017-01-17 MED ORDER — RENA-VITE PO TABS: 1.0000 | ORAL_TABLET | Freq: Every day | ORAL | 0 refills | Status: AC

## 2017-01-17 MED ORDER — SENNA 8.6 MG PO TABS
1.0000 | ORAL_TABLET | Freq: Every day | ORAL | Status: DC
  Administered 2017-01-17: 8.6 mg via ORAL
  Filled 2017-01-17: qty 1

## 2017-01-17 MED ORDER — ONDANSETRON HCL 4 MG/2ML IJ SOLN
4.0000 mg | Freq: Four times a day (QID) | INTRAMUSCULAR | Status: DC | PRN
  Administered 2017-01-25 – 2017-02-03 (×2): 4 mg via INTRAVENOUS
  Filled 2017-01-17 (×2): qty 2

## 2017-01-17 MED ORDER — RENA-VITE PO TABS
1.0000 | ORAL_TABLET | Freq: Every day | ORAL | Status: DC
  Administered 2017-01-17 – 2017-02-06 (×19): 1 via ORAL
  Filled 2017-01-17 (×21): qty 1

## 2017-01-17 MED ORDER — BISACODYL 10 MG RE SUPP: 10.0000 mg | Freq: Every day | RECTAL | Status: DC | PRN

## 2017-01-17 MED ORDER — OXYCODONE HCL 5 MG PO TABS
5.0000 mg | ORAL_TABLET | Freq: Once | ORAL | Status: AC
  Administered 2017-01-17: 5 mg via ORAL
  Filled 2017-01-17: qty 1

## 2017-01-17 MED ORDER — DIPHENHYDRAMINE HCL 12.5 MG/5ML PO ELIX: 12.5000 mg | ORAL_SOLUTION | Freq: Four times a day (QID) | ORAL | Status: DC | PRN

## 2017-01-17 MED ORDER — DARBEPOETIN ALFA 200 MCG/0.4ML IJ SOSY
200.0000 ug | PREFILLED_SYRINGE | INTRAMUSCULAR | Status: DC
  Filled 2017-01-17 (×2): qty 0.4

## 2017-01-17 MED ORDER — HEPARIN SODIUM (PORCINE) 5000 UNIT/ML IJ SOLN
5000.0000 [IU] | Freq: Three times a day (TID) | INTRAMUSCULAR | Status: DC
  Administered 2017-01-17 – 2017-01-22 (×10): 5000 [IU] via SUBCUTANEOUS
  Filled 2017-01-17 (×11): qty 1

## 2017-01-17 MED ORDER — CARVEDILOL 12.5 MG PO TABS
12.5000 mg | ORAL_TABLET | Freq: Every day | ORAL | Status: DC
  Administered 2017-01-17 – 2017-01-29 (×13): 12.5 mg via ORAL
  Filled 2017-01-17 (×13): qty 1

## 2017-01-17 MED ORDER — SENNA 8.6 MG PO TABS: 1.0000 | ORAL_TABLET | Freq: Every day | ORAL | Status: DC

## 2017-01-17 MED ORDER — LACTULOSE 10 GM/15ML PO SOLN
30.0000 g | Freq: Four times a day (QID) | ORAL | Status: DC | PRN
  Administered 2017-01-22 – 2017-01-29 (×2): 30 g via ORAL
  Filled 2017-01-17 (×2): qty 45

## 2017-01-17 MED ORDER — INSULIN ASPART 100 UNIT/ML ~~LOC~~ SOLN
0.0000 [IU] | Freq: Three times a day (TID) | SUBCUTANEOUS | Status: DC
  Administered 2017-01-17: 3 [IU] via SUBCUTANEOUS
  Administered 2017-01-18: 9 [IU] via SUBCUTANEOUS
  Administered 2017-01-18 – 2017-01-19 (×2): 2 [IU] via SUBCUTANEOUS
  Administered 2017-01-19: 3 [IU] via SUBCUTANEOUS
  Administered 2017-01-20: 2 [IU] via SUBCUTANEOUS
  Administered 2017-01-20: 1 [IU] via SUBCUTANEOUS
  Administered 2017-01-21: 5 [IU] via SUBCUTANEOUS
  Administered 2017-01-21: 7 [IU] via SUBCUTANEOUS
  Administered 2017-01-21: 1 [IU] via SUBCUTANEOUS
  Administered 2017-01-22: 5 [IU] via SUBCUTANEOUS
  Administered 2017-01-22: 7 [IU] via SUBCUTANEOUS
  Administered 2017-01-22: 1 [IU] via SUBCUTANEOUS
  Administered 2017-01-23: 7 [IU] via SUBCUTANEOUS
  Administered 2017-01-24 (×2): 1 [IU] via SUBCUTANEOUS
  Administered 2017-01-24: 3 [IU] via SUBCUTANEOUS
  Administered 2017-01-25: 5 [IU] via SUBCUTANEOUS
  Administered 2017-01-26: 2 [IU] via SUBCUTANEOUS
  Administered 2017-01-27: 3 [IU] via SUBCUTANEOUS
  Administered 2017-01-27 – 2017-01-28 (×2): 2 [IU] via SUBCUTANEOUS
  Administered 2017-01-30: 3 [IU] via SUBCUTANEOUS
  Administered 2017-01-30: 2 [IU] via SUBCUTANEOUS
  Administered 2017-01-31 – 2017-02-01 (×3): 3 [IU] via SUBCUTANEOUS
  Administered 2017-02-01: 1 [IU] via SUBCUTANEOUS
  Administered 2017-02-01 – 2017-02-02 (×2): 3 [IU] via SUBCUTANEOUS
  Administered 2017-02-02: 2 [IU] via SUBCUTANEOUS
  Administered 2017-02-03: 1 [IU] via SUBCUTANEOUS
  Administered 2017-02-03: 3 [IU] via SUBCUTANEOUS
  Administered 2017-02-04: 5 [IU] via SUBCUTANEOUS
  Administered 2017-02-04: 1 [IU] via SUBCUTANEOUS
  Administered 2017-02-05: 2 [IU] via SUBCUTANEOUS
  Administered 2017-02-05 – 2017-02-06 (×2): 1 [IU] via SUBCUTANEOUS
  Administered 2017-02-06: 3 [IU] via SUBCUTANEOUS
  Administered 2017-02-07: 2 [IU] via SUBCUTANEOUS

## 2017-01-17 MED ORDER — FLEET ENEMA 7-19 GM/118ML RE ENEM: 1.0000 | ENEMA | Freq: Once | RECTAL | Status: DC | PRN

## 2017-01-17 MED ORDER — GUAIFENESIN-DM 100-10 MG/5ML PO SYRP: 5.0000 mL | ORAL_SOLUTION | Freq: Four times a day (QID) | ORAL | Status: DC | PRN

## 2017-01-17 MED ORDER — ATORVASTATIN CALCIUM 80 MG PO TABS
80.0000 mg | ORAL_TABLET | Freq: Every day | ORAL | Status: DC
  Administered 2017-01-17 – 2017-02-06 (×21): 80 mg via ORAL
  Filled 2017-01-17 (×23): qty 1

## 2017-01-17 MED ORDER — HEPARIN 1000 UNIT/ML FOR PERITONEAL DIALYSIS
INTRAPERITONEAL | Status: DC | PRN
  Filled 2017-01-17: qty 5000

## 2017-01-17 MED ORDER — CALCIUM ACETATE (PHOS BINDER) 667 MG PO CAPS
667.0000 mg | ORAL_CAPSULE | Freq: Three times a day (TID) | ORAL | Status: DC
  Administered 2017-01-18 – 2017-02-07 (×53): 667 mg via ORAL
  Filled 2017-01-17 (×61): qty 1

## 2017-01-17 MED ORDER — TRAZODONE HCL 50 MG PO TABS
25.0000 mg | ORAL_TABLET | Freq: Every evening | ORAL | Status: DC | PRN
  Administered 2017-01-20 – 2017-01-23 (×3): 50 mg via ORAL
  Filled 2017-01-17 (×6): qty 1

## 2017-01-17 MED ORDER — PRO-STAT SUGAR FREE PO LIQD: 30.0000 mL | Freq: Two times a day (BID) | ORAL | Status: DC

## 2017-01-17 MED ORDER — INSULIN ASPART 100 UNIT/ML ~~LOC~~ SOLN
0.0000 [IU] | Freq: Every day | SUBCUTANEOUS | Status: DC
  Administered 2017-01-17: 4 [IU] via SUBCUTANEOUS
  Administered 2017-01-18: 2 [IU] via SUBCUTANEOUS
  Administered 2017-01-20: 5 [IU] via SUBCUTANEOUS
  Administered 2017-01-21 – 2017-01-24 (×2): 3 [IU] via SUBCUTANEOUS
  Administered 2017-01-28: 2 [IU] via SUBCUTANEOUS
  Administered 2017-01-29: 3 [IU] via SUBCUTANEOUS
  Administered 2017-02-01 – 2017-02-06 (×4): 2 [IU] via SUBCUTANEOUS

## 2017-01-17 MED ORDER — INSULIN NPH (HUMAN) (ISOPHANE) 100 UNIT/ML ~~LOC~~ SUSP: 40.0000 [IU] | Freq: Every day | SUBCUTANEOUS | Status: DC

## 2017-01-17 MED ORDER — GENTAMICIN SULFATE 0.1 % EX CREA
1.0000 "application " | TOPICAL_CREAM | Freq: Every day | CUTANEOUS | Status: DC
  Administered 2017-01-18: 1 via TOPICAL
  Filled 2017-01-17: qty 15

## 2017-01-17 MED ORDER — MIDODRINE HCL 5 MG PO TABS: 5.0000 mg | ORAL_TABLET | Freq: Two times a day (BID) | ORAL | Status: DC

## 2017-01-17 MED ORDER — DELFLEX-LC/2.5% DEXTROSE 394 MOSM/L IP SOLN
Freq: Every day | INTRAPERITONEAL | Status: DC
  Administered 2017-01-30: 19:00:00 via INTRAPERITONEAL

## 2017-01-17 MED ORDER — INSULIN NPH (HUMAN) (ISOPHANE) 100 UNIT/ML ~~LOC~~ SUSP
50.0000 [IU] | Freq: Every day | SUBCUTANEOUS | Status: DC
  Filled 2017-01-17: qty 10

## 2017-01-17 MED ORDER — POLYETHYLENE GLYCOL 3350 17 G PO PACK
17.0000 g | PACK | Freq: Every day | ORAL | Status: DC
  Filled 2017-01-17: qty 1

## 2017-01-17 MED ORDER — SIMETHICONE 80 MG PO CHEW: 80.0000 mg | CHEWABLE_TABLET | Freq: Four times a day (QID) | ORAL | Status: DC | PRN

## 2017-01-17 MED ORDER — SENNOSIDES-DOCUSATE SODIUM 8.6-50 MG PO TABS
1.0000 | ORAL_TABLET | Freq: Every day | ORAL | Status: DC
  Administered 2017-01-17 – 2017-01-29 (×12): 1 via ORAL
  Filled 2017-01-17 (×12): qty 1

## 2017-01-17 MED ORDER — VITAMIN D (ERGOCALCIFEROL) 1.25 MG (50000 UNIT) PO CAPS
50000.0000 [IU] | ORAL_CAPSULE | ORAL | Status: DC
  Administered 2017-02-04: 50000 [IU] via ORAL
  Filled 2017-01-17: qty 1

## 2017-01-17 MED ORDER — ALUMINUM HYDROXIDE GEL 320 MG/5ML PO SUSP
10.0000 mL | Freq: Four times a day (QID) | ORAL | Status: DC | PRN
  Filled 2017-01-17: qty 30

## 2017-01-17 MED ORDER — POLYETHYLENE GLYCOL 3350 17 G PO PACK: 17.0000 g | PACK | Freq: Every day | ORAL | Status: AC

## 2017-01-17 MED ORDER — DOCUSATE SODIUM 100 MG PO CAPS
100.0000 mg | ORAL_CAPSULE | Freq: Two times a day (BID) | ORAL | Status: DC
  Administered 2017-01-17 – 2017-02-07 (×38): 100 mg via ORAL
  Filled 2017-01-17 (×41): qty 1

## 2017-01-17 MED ORDER — PRO-STAT SUGAR FREE PO LIQD
30.0000 mL | Freq: Two times a day (BID) | ORAL | Status: DC
  Administered 2017-01-17 – 2017-02-07 (×41): 30 mL via ORAL
  Filled 2017-01-17 (×41): qty 30

## 2017-01-17 MED ORDER — INSULIN NPH (HUMAN) (ISOPHANE) 100 UNIT/ML ~~LOC~~ SUSP: 50.0000 [IU] | Freq: Every day | SUBCUTANEOUS | Status: DC

## 2017-01-17 MED ORDER — INSULIN NPH (HUMAN) (ISOPHANE) 100 UNIT/ML ~~LOC~~ SUSP
40.0000 [IU] | Freq: Every day | SUBCUTANEOUS | Status: DC
  Administered 2017-01-18 – 2017-01-19 (×2): 40 [IU] via SUBCUTANEOUS
  Filled 2017-01-17: qty 10

## 2017-01-17 MED ORDER — INSULIN NPH (HUMAN) (ISOPHANE) 100 UNIT/ML ~~LOC~~ SUSP
50.0000 [IU] | Freq: Every day | SUBCUTANEOUS | Status: DC
  Administered 2017-01-17 – 2017-01-18 (×2): 50 [IU] via SUBCUTANEOUS
  Filled 2017-01-17: qty 10

## 2017-01-17 MED ORDER — NEPRO/CARBSTEADY PO LIQD: 237.0000 mL | Freq: Two times a day (BID) | ORAL | 0 refills | Status: DC

## 2017-01-17 NOTE — Progress Notes (Signed)
Rehab admissions - A bed on rehab has become available this afternoon.  Patient has been cleared by attending MD for inpatient rehab admission.  Will admit to acute inpatient rehab today.  Call me for questions.  #[phone]

## 2017-01-17 NOTE — H&P (Signed)
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 Donzetta Matters. Hospital course complicated by N/V, leucocytosis and fever likely due to sepsis. GB ultrasound revealed GB polyp without acute cholecystitis. He has had difficulty swallowing due to oral pharyngeal ulcers and Dr. Constance Holster recommended supportive care for management. He has had issues with right groin wound dehiscence of wound with gangrenous changes left foot. 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 Sharol Given on 9/14 and right groin hematoma showed decrease in erythema.  He was admitted to CIR on 12/27/2016 due to deficits in mobility and decreased ability to carry out ADL tasks. His was noted to have hypotensive episodes with fatigue and frequently during the pm sessions. Nephrology has been following for management of PD and decreased amount of fluid removed with PD. Insulin has been titrated upwards slowly to prevent hypoglycemic episodes as blood sugars have been difficulty to control due to variable intake.  He developed increase in pain left foot with ischemic changes and skin break down requiring surgical intervention. He was discharged to acute on 01/06/17 due to ongoing fevers, delirium with hallucinations,  hypotension and concerns of developing sepsis.  He did develop jaw pain radiating to chest on 9/30 with SEMI and elevated troponin.  He was started on IV heparin and Cardiac cath did not show critical stenosis--he was felt to be moderate risk for surgery.  He underwent left transtibial amputation on 10/03 by Dr. Sharol Given and has defervesced. Therapy ongoing and patient with decreased balance, difficulty following  simple command consistently as well as debility compounded by L-BKA. CIR recommended to resume rehab course.     Review of Systems  Constitutional: Negative for chills and fever.       Hard to chew--difficulty opening mouth wide  HENT: Negative for hearing loss and tinnitus.   Eyes: Positive for blurred vision (right eye).  Respiratory: Negative for cough and shortness of breath.   Cardiovascular: Negative for chest pain and palpitations.  Gastrointestinal: Negative for abdominal pain, constipation, heartburn, nausea and vomiting.  Genitourinary: Negative for dysuria.  Musculoskeletal: Positive for myalgias.  Neurological: Positive for sensory change, focal weakness and weakness. Negative for dizziness and headaches.  Psychiatric/Behavioral: Positive for memory loss. The patient is nervous/anxious. The patient does not have insomnia.           Past Medical History:  Diagnosis Date  . AICD (automatic cardioverter/defibrillator) present   . CHF (congestive heart failure) ([place])   . COPD (chronic obstructive pulmonary disease) ([place])   . Coronary artery disease    CABG 2011  . Diabetes mellitus without complication ([place])   . ESRD on peritoneal dialysis [facility])    Started PD Dec 2016 in [place]. Nephrology is in [place].   [name] History of peritonitis    PD cath related peritonitis in April 2017  . Hypertension   . Peripheral vascular disease ([place])   . Sjogren's syndrome ([place])    Per pt's wife, was diagnosed in 2016 during w/u for cause of renal failure prior to starting dialysis.  He had a rash on his chest apparently prompting this w/u.  Never had renal bx.  He was referred to a  rheum MD in [place] and per the wife he was treated with MTX and other medications but had side effects to "all of it" and isn't taking anything for this as of Jun 2017.   . Stroke [facility])          Past Surgical History:  Procedure Laterality Date  . ABDOMINAL AORTOGRAM W/LOWER  EXTREMITY N/A 11/29/2016   Procedure: ABDOMINAL AORTOGRAM W/LOWER EXTREMITY;  Surgeon: Serafina Mitchell, MD;  Location: [place] CV LAB;  Service: Cardiovascular;  Laterality: N/A;  . AMPUTATION Left 11/29/2016   Procedure: LEFT THIRD TOE AMPUTATION;  Surgeon: Serafina Mitchell, MD;  Location: [facility] OR;  Service: Vascular;  Laterality: Left;  . AMPUTATION Left 12/23/2016   Procedure: Left Transmetatarsal Amputation;  Surgeon: Newt Minion, MD;  Location: [place];  Service: Orthopedics;  Laterality: Left;  . AMPUTATION Left 01/11/2017   Procedure: LEFT BELOW KNEE AMPUTATION;  Surgeon: Newt Minion, MD;  Location: [place];  Service: Orthopedics;  Laterality: Left;  . ANGIOPLASTY  12/15/2016   Procedure: BALLOON ANGIOPLASTY;  Surgeon: Waynetta Sandy, MD;  Location: [place];  Service: Vascular;;  . cardiac catherization with stent placement  2017  . CORONARY ARTERY BYPASS GRAFT  2011  . FALSE ANEURYSM REPAIR Right 12/15/2016   Procedure: REPAIR FALSE ANEURYSM;  Surgeon: Waynetta Sandy, MD;  Location: [place];  Service: Vascular;  Laterality: Right;  . IRRIGATION AND DEBRIDEMENT FOOT Left 12/15/2016   Procedure: IRRIGATION AND DEBRIDEMENT FOOT;  Surgeon: Waynetta Sandy, MD;  Location: [place];  Service: Vascular;  Laterality: Left;  . LEFT HEART CATH AND CORS/GRAFTS ANGIOGRAPHY N/A 01/09/2017   Procedure: LEFT HEART CATH AND CORS/GRAFTS ANGIOGRAPHY;  Surgeon: Lorretta Harp, MD;  Location: [place] CV LAB;  Service: Cardiovascular;  Laterality: N/A;  . LOWER EXTREMITY ANGIOGRAM Left 12/15/2016   Procedure: LOWER EXTREMITY ANGIOGRAM; PEDAL ACCESS;  Surgeon: Waynetta Sandy, MD;  Location: [place];  Service: Vascular;  Laterality: Left;  . PERIPHERAL VASCULAR BALLOON ANGIOPLASTY  11/29/2016   Procedure: PERIPHERAL VASCULAR BALLOON ANGIOPLASTY;  Surgeon: Serafina Mitchell, MD;  Location: [place] [facility];  Service: Cardiovascular;;  LT Peroneal AT attempted  unsuccessful         Family History  Problem Relation Age of Onset  . Heart disease Father     Social History:  Married. Retired Dealer. Wife supportive and can provide assistance after discharge. He reports that he has quit smoking 25 years ago. He has never used smokeless tobacco. He reports that he does not drink alcohol or use drugs.         Allergies  Allergen Reactions  . Lisinopril Hives  . Methotrexate Derivatives Other (See Comments)    blisters  . Sulfa Antibiotics Hives  . Tape Hives  . Vancomycin Other (See Comments)    Blisters   . Zanaflex [Tizanidine Hcl]     Hypotensive stroke          Medications Prior to Admission  Medication Sig Dispense Refill  . aspirin EC 81 MG tablet Take 81 mg by mouth daily.    [name] atorvastatin (LIPITOR) 80 MG tablet Take 80 mg by mouth daily at 6 PM.    . calcium acetate (PHOSLO) 667 MG capsule Take 667 mg by mouth 3 (three) times daily with meals.    . carvedilol (COREG) 12.5 MG tablet Take 12.5 mg by mouth 2 (two) times daily with a meal.    . clopidogrel (PLAVIX) 75 MG  tablet Take 75 mg by mouth daily.    [name] docusate sodium (COLACE) 100 MG capsule Take 100 mg by mouth 2 (two) times daily.     . insulin NPH Human (HUMULIN N,NOVOLIN N) 100 UNIT/ML injection Inject 70 Units into the skin at bedtime.    . insulin regular (NOVOLIN R,HUMULIN R) 100 units/mL injection Inject 20 Units into the skin 3 (three) times daily before meals.    . isosorbide mononitrate (IMDUR) 30 MG 24 hr tablet Take 30 mg by mouth daily.    . nitroGLYCERIN (NITROSTAT) 0.4 MG SL tablet Place 0.4 mg under the tongue every 5 (five) minutes as needed for chest pain.    [name] ondansetron (ZOFRAN) 4 MG tablet Take 4 mg by mouth every 6 (six) hours as needed for nausea or vomiting.    [name] oxyCODONE-acetaminophen (PERCOCET/ROXICET) 5-325 MG tablet Take 1 tablet by mouth every 6 (six) hours as needed for severe pain. 20 tablet 0  .  Vitamin D, Ergocalciferol, (DRISDOL) 50000 units CAPS capsule Take 50,000 Units by mouth every 30 (thirty) days.     . [DISCONTINUED] furosemide (LASIX) 40 MG tablet Take 40 mg by mouth 2 (two) times daily.      Drug Regimen Review  Drug regimen was reviewed and remains appropriate with no significant issues identified  Home: Home Living Family/patient expects to be discharged to:: Inpatient rehab Living Arrangements: Spouse/significant other Available Help at Discharge: Family, Available 24 hours/day Type of Home: Mobile home Home Access: Stairs to enter Entrance Stairs-Number of Steps: 1 Entrance Stairs-Rails: Left Home Layout: One level Bathroom Shower/Tub: Gaffer, Charity fundraiser: Standard Home Equipment: Environmental consultant - 4 wheels, Radio producer - quad, Wheelchair - manual  Lives With: Spouse   Functional History: Prior Function Level of Independence: Needs assistance Gait / Transfers Assistance Needed: uses [name], RW ADL's / Homemaking Assistance Needed: A for socks, can bath self in shower Comments: reports hobbling around the house, using rollator as able. Legs would "give out" at times but denies having any falls.   Functional Status:  Mobility: Bed Mobility Overal bed mobility: Needs Assistance Bed Mobility: Supine to Sit Supine to sit: Mod assist Sit to supine: Min guard General bed mobility comments: Mod assist to push up into seated position. Pt able to use bed rails with feet of bed elevated to scoot up in bed.  Transfers Overall transfer level: Needs assistance Equipment used: Rolling walker (2 wheeled), 2 person hand held assist Transfer via Lift Equipment: Stedy Transfers: Sit to/from Stand Sit to Stand: Max assist, +2 physical assistance Stand pivot transfers: +2 physical assistance, Total assist (sitting on [place]) General transfer comment: assist for forward translation of trunk and assist to lift trunk.  Assist for balance  ADL: ADL Overall ADL's  : Needs assistance/impaired Eating/Feeding: Set up, Sitting Grooming: Wash/dry hands, Wash/dry face, Oral care, Brushing hair, Minimal assistance, Sitting Upper Body Bathing: Maximal assistance, Sitting Lower Body Bathing: Maximal assistance, Sitting/lateral leans, Bed level Upper Body Dressing : Maximal assistance, Sitting Lower Body Dressing: Total assistance, Sit to/from stand, Sitting/lateral leans Toilet Transfer: Maximal assistance, +2 for physical assistance, Stand-pivot, [facility] Toilet Transfer Details (indicate cue type and reason): Attempted transfer to [facility] but unable to complete due to weakness.  Toileting- Clothing Manipulation and Hygiene: Total assistance, Sit to/from stand Functional mobility during ADLs: Maximal assistance, +2 for physical assistance General ADL Comments: Pt able to complete sit to stand transfer x 2 with max assist +2.   Cognition: Cognition Overall Cognitive Status:  Within Functional Limits for tasks assessed Orientation Level: Oriented X4 Cognition Arousal/Alertness: Awake/alert Behavior During Therapy: Flat affect Overall Cognitive Status: Within Functional Limits for tasks assessed Area of Impairment: Attention, Following commands, Problem solving Current Attention Level: Sustained, Focused Following Commands: Follows one step commands inconsistently, Follows one step commands with increased time Problem Solving: Slow processing, Decreased initiation, Difficulty sequencing, Requires verbal cues, Requires tactile cues General Comments: Pt requires mod - max cues for motor planning.  Easily distracts.  Follow one step commands intermittently.  ? if cognitive deficits may be med related.  RN aware    Blood pressure (!) 124/50, pulse 91, temperature 98.7 F (37.1 C), temperature source Oral, resp. rate 20, height '5\' 9"'  (1.753 m), weight 86.7 kg (191 lb 2.2 oz), SpO2 99 %. Physical Exam  Nursing note and vitals reviewed. Constitutional: He appears  well-nourished. No distress.  Up in [place].  HENT:  Head: Normocephalic and atraumatic.  Mouth/Throat: Oropharynx is clear and moist.  Eyes: Pupils are equal, round, and reactive to light. Conjunctivae are normal.  Left ptosis  Neck: Normal range of motion. Neck supple.  Cardiovascular: Normal rate and regular rhythm.   Respiratory: Effort normal and breath sounds normal. No stridor. No respiratory distress. He has no wheezes.  GI: Soft. Bowel sounds are normal. He exhibits no distension. There is no tenderness.  Musculoskeletal: He exhibits edema and tenderness (right heel and foot extremely tender to touch. Right heel red and boggy. ).  Toes on right foot with mild rubor. Right fifth toe ulcer has increased in size with moist dark eschar on yellow wound bed. Right groin hematoma almost resolved but lower aspect on incision with ~ 4 mm dehiscence with yellow eschar.    Neurological: He is alert.  Easily agitated. Thought he was in [place] but able to correct with minimal cues. Able to follow simple motor commands.   Skin: Skin is warm and dry. He is not diaphoretic.  Abrasion on left ear resolved and small area on right ear lobe. Area on nasal bridge with dry dressing.   Psychiatric: His speech is normal. He is withdrawn.    Lab Results Last 48 Hours        Results for orders placed or performed during the hospital encounter of 01/06/17 (from the past 48 hour(s))  Glucose, capillary     Status: Abnormal   Collection Time: 01/15/17  4:23 PM  Result Value Ref Range   Glucose-Capillary 186 (H) 65 - 99 mg/dL   Comment 1 Notify RN    Comment 2 Document in Chart   Glucose, capillary     Status: Abnormal   Collection Time: 01/15/17  9:27 PM  Result Value Ref Range   Glucose-Capillary 140 (H) 65 - 99 mg/dL  Renal function panel     Status: Abnormal   Collection Time: 01/16/17  4:44 AM  Result Value Ref Range   Sodium 133 (L) 135 - 145 mmol/L   Potassium 3.7 3.5 -  5.1 mmol/L   Chloride 92 (L) 101 - 111 mmol/L   CO2 25 22 - 32 mmol/L   Glucose, Bld 145 (H) 65 - 99 mg/dL   BUN 61 (H) 6 - 20 mg/dL   Creatinine, Ser 8.20 (H) 0.61 - 1.24 mg/dL   Calcium 8.5 (L) 8.9 - 10.3 mg/dL   Phosphorus 4.6 2.5 - 4.6 mg/dL   Albumin 1.4 (L) 3.5 - 5.0 g/dL   GFR calc non Af Amer 6 (L) >60 mL/min  GFR calc Af Amer 7 (L) >60 mL/min    Comment: (NOTE) The eGFR has been calculated using the CKD EPI equation. This calculation has not been validated in all clinical situations. eGFR's persistently <60 mL/min signify possible Chronic Kidney Disease.    Anion gap 16 (H) 5 - 15  Glucose, capillary     Status: Abnormal   Collection Time: 01/16/17  6:11 AM  Result Value Ref Range   Glucose-Capillary 183 (H) 65 - 99 mg/dL  Glucose, capillary     Status: Abnormal   Collection Time: 01/16/17 11:34 AM  Result Value Ref Range   Glucose-Capillary 304 (H) 65 - 99 mg/dL  Glucose, capillary     Status: Abnormal   Collection Time: 01/16/17  5:07 PM  Result Value Ref Range   Glucose-Capillary 272 (H) 65 - 99 mg/dL  Glucose, capillary     Status: Abnormal   Collection Time: 01/16/17  9:25 PM  Result Value Ref Range   Glucose-Capillary 361 (H) 65 - 99 mg/dL  Glucose, capillary     Status: Abnormal   Collection Time: 01/17/17  6:17 AM  Result Value Ref Range   Glucose-Capillary 387 (H) 65 - 99 mg/dL  Glucose, capillary     Status: Abnormal   Collection Time: 01/17/17 11:46 AM  Result Value Ref Range   Glucose-Capillary 309 (H) 65 - 99 mg/dL     Imaging Results (Last 48 hours)  No results found.       Medical Problem List and Plan: 1.  Functional deficits secondary to left BKA             -admit to inpatient rehab 2.  DVT Prophylaxis/Anticoagulation: Pharmaceutical: Heparin 3. Pain Management: Tylenol or Celebrex prn. Unable to tolerate muscle relaxers or pain medications due to SE of tremors, confusion and hallucinations.  4. Mood:  LCSW to follow for evaluation and support.  5. Neuropsych: This patient is not fully capable of making decisions on his own behalf. 6. Skin/Wound Care: Routine pressure relief measures. Prevalon boot for right foot. Monitor daily for intake to promote healing. Protein supplement and vitamins to promote wound healing.  7. Fluids/Electrolytes/Nutrition: Monitor I/O. Lytes/CBC per PD protocol. Hyponatremia improving. 8. Leucocytosis: WBC has fluctuated. Monitor for fevers and other sign of infection.  9. Hypotension: Monitor BP bid----are better on midodrine.  10. ESRD on PD: Schedule PD in evening and early am--unhook by breakfast.  11. Anemia of chronic disease: On aranesp every week.  12. T2DM insulin dependent: Brittle diabetic who's hard to control in part due to inconsistent intake--currently at 25%. BS ranging from 200- 300 likely due to supplements between meals. Change to Premier protein with meals.  13. Constipation: Had good BM yesterday with lactulose per wife. Continue Colace bid with lactulose prn. sorbitol today.  14. PVD with s/p left femoral pseudoaneurysm repair: Will have VVS follow up for evaluation in am. Order Prevalon boot for RLE with pressure relief measures.  13. Delirium: Improved but continues to have occasional hallucinations per wife.     Post Admission Physician Evaluation: 1. Functional deficits secondary  to left BKA. 2. Patient is admitted to receive collaborative, interdisciplinary care between the physiatrist, rehab nursing staff, and therapy team. 3. Patient's level of medical complexity and substantial therapy needs in context of that medical necessity cannot be provided at a lesser intensity of care such as a SNF. 4. Patient has experienced substantial functional loss from his/her baseline which was documented above under the "Functional History" and "  Functional Status" headings.  Judging by the patient's diagnosis, physical exam, and functional history, the  patient has potential for functional progress which will result in measurable gains while on inpatient rehab.  These gains will be of substantial and practical use upon discharge  in facilitating mobility and self-care at the household level. 5. Physiatrist will provide 24 hour management of medical needs as well as oversight of the therapy plan/treatment and provide guidance as appropriate regarding the interaction of the two. 6. The Preadmission Screening has been reviewed and patient status is unchanged unless otherwise stated above. 7. 24 hour rehab nursing will assist with bladder management, bowel management, safety, skin/wound care, disease management, medication administration, pain management and patient education  and help integrate therapy concepts, techniques,education, etc. 8. PT will assess and treat for/with: Lower extremity strength, range of motion, stamina, balance, functional mobility, safety, adaptive techniques and equipment, pain mgt, pre-prosthetic education, family ed.   Goals are: supervision to min assist. 9. OT will assess and treat for/with: ADL's, functional mobility, safety, upper extremity strength, adaptive techniques and equipment, pain mgt, pre-prosthetic education, ego support, family ed.   Goals are: supervision to min assist. Therapy may not yet proceed with showering this patient. 10. SLP will assess and treat for/with: n/a.  Goals are: n/a. 11. Case Management and Social Worker will assess and treat for psychological issues and discharge planning. 12. Team conference will be held weekly to assess progress toward goals and to determine barriers to discharge. 13. Patient will receive at least 3 hours of therapy per day at least 5 days per week. 14. ELOS: 14-18 days       15. Prognosis:  excellent     Meredith Staggers, MD, [facility] 01/17/2017  Bary Leriche, PA-C 01/17/2017

## 2017-01-17 NOTE — IPOC Note (Addendum)
Overall Plan of Care [facility]) Patient Details Name: [name] MRN: [id number] DOB: 1940/05/25  Admitting Diagnosis: Right BKA  Hospital Problems: Active Problems:   Unilateral complete BKA, right, sequela (HCC)   Acute blood loss anemia   Ischemic pain of right foot   Arterial hypotension   Diabetes mellitus type 2 in obese (HCC)   PVD (peripheral vascular disease) (HCC)   Delirium     Functional Problem List: Nursing Bowel, Medication Management, Pain, Safety, Skin Integrity  PT Balance, Endurance, Motor  OT Balance, Endurance, Motor, Pain, Skin Integrity, Safety  SLP    TR         Basic ADL's: OT Grooming, Bathing, Dressing, Toileting     Advanced  ADL's: OT       Transfers: PT Bed Mobility, Bed to Chair, Car, [employer], Civil Service fast streamer, Research scientist (life sciences): PT Ambulation, Psychologist, prison and probation services, Stairs     Additional Impairments: OT None  SLP        TR      Anticipated Outcomes Item Anticipated Outcome  Self Feeding    Swallowing      Basic self-care  Supervision/min assist  Toileting  min assist   Bathroom Transfers min assist  Bowel/Bladder  Min assist  Transfers  min assist transfers with LRAD  Locomotion  primarily w/c from mobility, possible ambulation short distances  Communication     Cognition     Pain  Less then 3 out of 10  Safety/Judgment  Mod I   Therapy Plan: PT Intensity: Minimum of 1-2 x/day ,45 to 90 minutes PT Frequency: 5 out of 7 days PT Duration Estimated Length of Stay: 14-18 OT Intensity: Minimum of 1-2 x/day, 45 to 90 minutes OT Frequency: 5 out of 7 days OT Duration/Estimated Length of Stay: 14-18 days      Team Interventions: Nursing Interventions Patient/Family Education, Bowel Management, Medication Management, Skin Care/Wound Management, Discharge Planning  PT interventions Ambulation/gait training, Balance/vestibular training, Community reintegration, Discharge planning, Disease  management/prevention, DME/adaptive equipment instruction, Functional mobility training, Neuromuscular re-education, Pain management, Patient/family education, Psychosocial support, Skin care/wound management, Stair training, Therapeutic Activities, Therapeutic Exercise, UE/LE Strength taining/ROM, Wheelchair propulsion/positioning  OT Interventions Warden/ranger, Discharge planning, Disease mangement/prevention, DME/adaptive equipment instruction, Functional mobility training, Pain management, Patient/family education, Psychosocial support, Self Care/advanced ADL retraining, Skin care/wound managment, Therapeutic Activities, Therapeutic Exercise, UE/LE Strength taining/ROM, Wheelchair propulsion/positioning  SLP Interventions    TR Interventions    SW/CM Interventions Discharge Planning, Psychosocial Support, Patient/Family Education   Barriers to Discharge MD  Medical stability, Home enviroment access/loayout, Wound care, Lack of/limited family support and PD  Nursing Wound Care, Medical stability, Weight bearing restrictions    PT Home environment access/layout step to enter home  OT Home environment access/layout    SLP      SW       Team Discharge Planning: Destination: PT-Home ,OT- Home , SLP-  Projected Follow-up: PT-Home health PT, OT-  Home health OT, SLP-  Projected Equipment Needs: PT-Wheelchair cushion (measurements), Wheelchair (measurements), Sliding board, Rolling walker with 5" wheels, OT- To be determined, SLP-  Equipment Details: PT-family has transport chair at home, OT-  Patient/family involved in discharge planning: PT- Patient, Family Adult nurse,  OT-Patient, Family member/caregiver, SLP-   MD ELOS: 15-18 days. 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 Randie Heinz. Hospital course complicated by N/V, leucocytosis and fever likely due to sepsis. GB ultrasound revealed GB polyp without acute cholecystitis. He has had difficulty swallowing due to oral pharyngeal ulcers and Dr. Pollyann Kennedy recommended supportive care for management. He has had issues with right groin wound dehiscence of wound with gangrenous changes left foot. 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 Lajoyce Corners on 9/14 and right groin hematoma showed decrease in erythema. He was admitted to CIR on 12/27/2016 due to deficits in mobility and decreased ability to carry out ADL tasks. His was noted to have hypotensive episodes with fatigue and frequently during the pm sessions. Nephrology was following for management of PD and decreased amount of fluid removed with PD. Insulin had been titrated upwards slowly to prevent hypoglycemic episodes as blood sugars have been difficulty to control due to variable intake. He developed increase in pain left foot with ischemic changes and skin break down requiring surgical intervention. He was discharged to acute on 01/06/17 due to ongoing fevers, delirium with hallucinations, hypotension and concerns of developing sepsis. He did develop jaw pain radiating to chest on 9/30 with SEMI and elevated troponin. He was started on IV heparin and Cardiac cath did not show critical stenosis--he was felt to be moderate risk for surgery. He underwent left transtibial amputation on 10/03 by Dr. Lajoyce Corners and has defervesced. Therapy ongoing and patient with decreased balance, difficulty following simple command consistently as well as debility compounded by L-BKA. Will set goals for Min A with PT/OT.  See Team Conference Notes for weekly updates to the plan of care

## 2017-01-17 NOTE — Progress Notes (Signed)
Rehab admissions - I met with patient and his wife at the bedside.  Wife is his caregiver.  They would like inpatient rehab re-admit and then hope to go home.  I explained limitations of discharge options due to need for PD.  Wife does not think patient's heart could tolerate HD.  I encouraged patient to participate fully with goal to be able to transfer from bed to chair and vice versa so that he can get back home.  I will try to get an inpatient rehab bed as soon as a bed opens up in the next couple days.  Call me for questions.  #[phone]

## 2017-01-17 NOTE — PMR Pre-admission (Signed)
PMR Admission Coordinator Pre-Admission Assessment  Patient: [name] is an 76 y.o., male MRN: [id number] DOB: 11/08/1940 Height:  (175.3 cm) Weight: 86.7 kg (191 lb 2.2 oz)             Insurance Information HMO: No    PPO:       PCP:       IPA:       80/20:       OTHER:   PRIMARY:  Medicare A and B      Policy#: [id number]      Subscriber: [name] CM Name:        Phone#:       Fax#:   Pre-Cert#:        Employer: Retired Benefits:  Phone #:       Name: Checked in Passport One source Eff. Date: 02/09/06     Deduct: $1340      Out of Pocket Max: none      Life Max: N/A CIR: 100%      SNF: 100 days Outpatient: 80%     Co-Pay: 20% Home Health: 100%      Co-Pay: none DME: 80%     Co-Pay: 20% Providers: patient's choice  SECONDARY: BCBS      Policy#: [id number]      Subscriber: [name] CM Name:        Phone#:       Fax#:   Pre-Cert#:        Employer: Retired Benefits:  Phone #: [phone]     Name:   Eff. Date:       Deduct:        Out of Pocket Max:        Life Max:   CIR:        SNF:   Outpatient:       Co-Pay:   Home Health:        Co-Pay:   DME:       Co-Pay:    Emergency Contact Information Contact Information    Name Relation Home Work Mobile   Malkin,[name] Spouse [phone]  [phone]     Current Medical History  Patient Admitting Diagnosis: L BKA  History of Present Illness:  A 76 y.o. malewith hx of HTN, CVA, ESRD on PD, DM on insulin, COPD and CHFw AICD, hx CAD/ CABG 2011. Pt was admitted on 12/15/16 for non-healing L foot wound and R groin pseudoaneurysm, underwent repair of PA R groin and also resection of left 3rd metatarsal head by Dr Randie Heinz. However, the left foot progressed and developed gangrenous changes, so on 9/14 pt went for L transmetatarsal amputation by Dr Lajoyce Corners. Post op abx were stopped and pt admitted to rehab on 12/27/16.   On 9/23 pt had fevers and persistent WBC and tachycardia. Started on [place] due to concerns of  possible infection. UcX and Blood cx's were negative. CXR was negative. Pt had increasing L foot pain w/ some skin changes/ blistering and serous drainage from L TMA wound. Dr Lajoyce Corners was consulted and per the chart, possible plan is for surgical intervention next week. Discussed with Dr. Lajoyce Corners and will see the patient Monday. C/w Abx for now.   Patient developed Chest Pain on 9/30 so he was given full dose ASA, SL NTG, and a Stat EKG and Troponin was ordered. EKG reviewed and showed significant ST Depressions (agreed by Cardiologist Dr. Donnie Aho) so Cardiology was consulted and patient was placed on a NTG gtt  and Heparin gtt and transferred to SDU. Patient underwent catheterization and required no stenting and there was no critical stenosis identified. Cardiology cleared the patient. 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 Lajoyce Corners.  PT/OT evaluations done with continued recommendations for inpatient rehab admission.  Patient to be admitted today for completion of comprehensive inpatient rehab program.  Past Medical History  Past Medical History:  Diagnosis Date  . AICD (automatic cardioverter/defibrillator) present   . CHF (congestive heart failure) (HCC)   . COPD (chronic obstructive pulmonary disease) (HCC)   . Coronary artery disease    CABG 2011  . Diabetes mellitus without complication (HCC)   . ESRD on peritoneal dialysis [facility])    Started PD Dec 2016 in [place]. Nephrology is in [place].   [name] History of peritonitis    PD cath related peritonitis in April 2017  . Hypertension   . Peripheral vascular disease (HCC)   . Sjogren's syndrome (HCC)    Per pt's wife, was diagnosed in 2016 during w/u for cause of renal failure prior to starting dialysis.  He had a rash on his chest apparently prompting this w/u.  Never had renal bx.  He was referred to a rheum MD in [place] and per the wife he was treated with MTX and other medications but had side effects to "all of it"  and isn't taking anything for this as of Jun 2017.   . Stroke [facility])     Family History  family history includes Heart disease in his father.  Prior Rehab/Hospitalizations: Was on CIR 12/27/16 to 01/06/17 with return to acute hospital after he developed chest pain.  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  Has the patient had major surgery during 100 days prior to admission? Yes.  Patient had a L TMA on 12/23/16.  Also had an ICD placed on 11/23/16.  Current Medications   Current Facility-Administered Medications:  .  0.9 %  sodium chloride infusion, 250 mL, Intravenous, PRN, Runell Gess, MD .  0.9 %  sodium chloride infusion, , Intravenous, Continuous, Bethena Midget, MD, Last Rate: 10 mL/hr at 01/11/17 1005 .  acetaminophen (TYLENOL) tablet 650 mg, 650 mg, Oral, Q6H PRN **OR** acetaminophen (TYLENOL) suppository 650 mg, 650 mg, Rectal, Q6H PRN, Nadara Mustard, MD .  aspirin chewable tablet 81 mg, 81 mg, Oral, Daily, Runell Gess, MD, 81 mg at 01/17/17 1005 .  bisacodyl (DULCOLAX) suppository 10 mg, 10 mg, Rectal, Daily PRN, Nadara Mustard, MD .  calcium acetate (PHOSLO) capsule 667 mg, 667 mg, Oral, TID WC, Delano Metz, MD, 667 mg at 01/17/17 1247 .  carvedilol (COREG) tablet 12.5 mg, 12.5 mg, Oral, QHS, Delano Metz, MD, 12.5 mg at 01/16/17 2230 .  clopidogrel (PLAVIX) tablet 75 mg, 75 mg, Oral, Q breakfast, Runell Gess, MD, 75 mg at 01/17/17 0606 .  Darbepoetin Alfa (ARANESP) injection 200 mcg, 200 mcg, Subcutaneous, Q Tue-1800, Camille Bal, MD, 200 mcg at 01/10/17 2007 .  dialysis solution 2.5% low-MG/low-CA dianeal solution, , Intraperitoneal, 5 X Daily, Camille Bal, MD .  docusate sodium (COLACE) capsule 100 mg, 100 mg, Oral, BID, Nadara Mustard, MD, 100 mg at 01/17/17 1004 .  feeding supplement (NEPRO CARB STEADY) liquid 237 mL, 237 mL, Oral, BID BM, Delano Metz, MD, Last Rate: 0 mL/hr at 01/15/17 1900, 237 mL at 01/17/17 1416 .  feeding supplement (PRO-STAT  SUGAR FREE 64) liquid 30 mL, 30 mL, Oral, BID, Narda Bonds, MD,  30 mL at 01/17/17 1012 .  gentamicin cream (GARAMYCIN) 0.1 % 1 application, 1 application, Topical, Daily, Annie Sable, MD, 1 application at 01/17/17 1022 .  heparin 2,500 Units in dialysis solution 2.5% low-MG/low-CA 5,000 mL dialysis solution, , Peritoneal Dialysis, PRN, Camille Bal, MD .  heparin injection 5,000 Units, 5,000 Units, Subcutaneous, Q8H, Marguerita Merles [place], DO, 5,000 Units at 01/17/17 1416 .  hydrALAZINE (APRESOLINE) injection 10 mg, 10 mg, Intravenous, Q4H PRN, Runell Gess, MD .  insulin aspart (novoLOG) injection 0-9 Units, 0-9 Units, Subcutaneous, TID WC, Delano Metz, MD, 7 Units at 01/17/17 1247 .  insulin NPH Human (HUMULIN N,NOVOLIN N) injection 40 Units, 40 Units, Subcutaneous, QAC breakfast, 40 Units at 01/17/17 1014 **AND** insulin NPH Human (HUMULIN N,NOVOLIN N) injection 50 Units, 50 Units, Subcutaneous, QHS, [place], Ralph A, MD .  lactulose (CHRONULAC) 10 GM/15ML solution 30 g, 30 g, Oral, Q6H PRN, Elvis Coil, MD, 30 g at 01/16/17 1438 .  metoCLOPramide (REGLAN) tablet 5-10 mg, 5-10 mg, Oral, Q8H PRN **OR** metoCLOPramide (REGLAN) injection 5-10 mg, 5-10 mg, Intravenous, Q8H PRN, Nadara Mustard, MD .  midodrine (PROAMATINE) tablet 5 mg, 5 mg, Oral, BID WC, Delano Metz, MD, 5 mg at 01/17/17 1004 .  morphine 4 MG/ML injection 2 mg, 2 mg, Intravenous, Q1H PRN, Della Goo, RPH, 2 mg at 01/15/17 2237 .  multivitamin (RENA-VIT) tablet 1 tablet, 1 tablet, Oral, QHS, Delano Metz, MD, 1 tablet at 01/16/17 2230 .  nitroGLYCERIN (NITROSTAT) SL tablet 0.4 mg, 0.4 mg, Sublingual, Q5 min PRN, Delano Metz, MD, 0.4 mg at 01/08/17 1320 .  ondansetron (ZOFRAN) tablet 4 mg, 4 mg, Oral, Q6H PRN **OR** ondansetron (ZOFRAN) injection 4 mg, 4 mg, Intravenous, Q6H PRN, Nadara Mustard, MD, 4 mg at 01/12/17 1017 .  oxyCODONE-acetaminophen (PERCOCET/ROXICET) 5-325 MG per tablet 1 tablet, 1  tablet, Oral, Q6H PRN, Delano Metz, MD, 1 tablet at 01/15/17 1542 .  polyethylene glycol (MIRALAX / GLYCOLAX) packet 17 g, 17 g, Oral, Daily, Narda Bonds, MD .  senna (SENOKOT) tablet 8.6 mg, 1 tablet, Oral, Daily, Narda Bonds, MD, 8.6 mg at 01/17/17 1004 .  sodium chloride flush (NS) 0.9 % injection 3 mL, 3 mL, Intravenous, Q12H, Delano Metz, MD, 3 mL at 01/16/17 1000 .  sodium chloride flush (NS) 0.9 % injection 3 mL, 3 mL, Intravenous, PRN, Delano Metz, MD .  sodium chloride flush (NS) 0.9 % injection 3 mL, 3 mL, Intravenous, Q12H, Runell Gess, MD, 3 mL at 01/15/17 2248 .  sodium chloride flush (NS) 0.9 % injection 3 mL, 3 mL, Intravenous, PRN, Runell Gess, MD .  Vitamin D (Ergocalciferol) (DRISDOL) capsule 50,000 Units, 50,000 Units, Oral, Q30 days, Delano Metz, MD, 50,000 Units at 01/07/17 1014  Patients Current Diet: Diet Carb Modified Fluid consistency: Thin; Room service appropriate? Yes  Precautions / Restrictions Precautions Precautions: Fall Precaution Comments: PD port, VAC left residual limb Restrictions Weight Bearing Restrictions: Yes LLE Weight Bearing: Non weight bearing LLE Partial Weight Bearing Percentage or Pounds: Darco shoe   Has the patient had 2 or more falls or a fall with injury in the past year?No  Prior Activity Level Limited Community (1-2x/wk): Went out2-3 X a week.  Not driving since 1/61/09 when ICD was placed.  Wife drives.  Home Assistive Devices / Equipment Home Assistive Devices/Equipment: CBG Meter, BIPAP, Blood pressure cuff, Eyeglasses, Scales, Other (Comment) (Peritoneal dialysis equipment) Home Equipment: Walker - 4 wheels, Cane - quad, Wheelchair - manual  Prior Device Use: Indicate devices/aids used by the patient prior to current illness, exacerbation or injury? Manual wheelchair.  Used wheelchair for shopping and for longer distances.  Prior Functional Level Prior Function Level of Independence: Needs  assistance Gait / Transfers Assistance Needed: uses [name], RW ADL's / Homemaking Assistance Needed: A for socks, can bath self in shower Comments: reports hobbling around the house, using rollator as able. Legs would "give out" at times but denies having any falls.   Self Care: Did the patient need help bathing, dressing, using the toilet or eating?  Independent  Indoor Mobility: Did the patient need assistance with walking from room to room (with or without device)? Independent  Stairs: Did the patient need assistance with internal or external stairs (with or without device)? Independent  Functional Cognition: Did the patient need help planning regular tasks such as shopping or remembering to take medications? Independent  Current Functional Level Cognition  Overall Cognitive Status: Within Functional Limits for tasks assessed Current Attention Level: Sustained, Focused Orientation Level: Oriented X4 Following Commands: Follows one step commands inconsistently, Follows one step commands with increased time General Comments: Pt requires mod - max cues for motor planning.  Easily distracts.  Follow one step commands intermittently.  ? if cognitive deficits may be med related.  RN aware     Extremity Assessment (includes Sensation/Coordination)  Upper Extremity Assessment: Defer to OT evaluation  Lower Extremity Assessment: Defer to PT evaluation LLE Deficits / Details:  knee and hip strength grossly 3-/5    ADLs  Overall ADL's : Needs assistance/impaired Eating/Feeding: Set up, Sitting Grooming: Wash/dry hands, Wash/dry face, Oral care, Brushing hair, Minimal assistance, Sitting Upper Body Bathing: Maximal assistance, Sitting Lower Body Bathing: Maximal assistance, Sitting/lateral leans, Bed level Upper Body Dressing : Maximal assistance, Sitting Lower Body Dressing: Total assistance, Sit to/from stand, Sitting/lateral leans Toilet Transfer: Maximal assistance, +2 for physical  assistance, Stand-pivot, [facility] Toilet Transfer Details (indicate cue type and reason): Attempted transfer to [facility] but unable to complete due to weakness.  Toileting- Clothing Manipulation and Hygiene: Total assistance, Sit to/from stand Functional mobility during ADLs: Maximal assistance, +2 for physical assistance General ADL Comments: Pt able to complete sit to stand transfer x 2 with max assist +2.     Mobility  Overal bed mobility: Needs Assistance Bed Mobility: Supine to Sit Supine to sit: Mod assist Sit to supine: Min guard General bed mobility comments: Pt sitting on side of bed on arrival with wife.  Pt with flexed     Transfers  Overall transfer level: Needs assistance Equipment used:  (none) Transfer via Lift Equipment: Stedy Transfers: Lateral/Scoot Transfers Sit to Stand: Max assist, +2 physical assistance Stand pivot transfers: +2 physical assistance, Total assist (sitting on [place])  Lateral/Scoot Transfers: Mod assist General transfer comment: Pt performed lateral scoot from bed to WC and WC to drop arm recliner chair.  Pt required cues for hand placement, forward weight shifting and scooting from surface to surface.      Ambulation / Gait / Stairs / Wheelchair Mobility  Ambulation/Gait Ambulation/Gait assistance:  (Pt is not safe to ambulate at this time due to weakness.  ) Wheelchair Mobility Wheelchair mobility: Yes Wheelchair propulsion: Both upper extremities, Right lower extremity Wheelchair parts: Needs assistance Distance: 80 ft, min assist for forward propulsion and cues for turning, backing and using RLE.  Pt fatigues quickly and required rest breaks x 2.   Wheelchair Assistance Details (indicate cue type and reason): Pt required total assistance  for setup of WC for transfers, locking brakes, and WC parts.      Posture / Balance Dynamic Sitting Balance Sitting balance - Comments: Pt with difficulty maintaining upright sitting posture. Left lateral  lean Balance Overall balance assessment: Needs assistance Sitting-balance support: Feet supported, Bilateral upper extremity supported Sitting balance-Leahy Scale: Fair Sitting balance - Comments: Pt with difficulty maintaining upright sitting posture. Left lateral lean Postural control: Posterior lean Standing balance support: Bilateral upper extremity supported Standing balance-Leahy Scale:  (NT) Standing balance comment: requires max A +2.  could not achieve full upright with pt flexed trunk    Special needs/care consideration BiPAP/CPAP Yes, Bipap CPM No Continuous Drip IV KVO Dialysis Yes, PD        Days: Nightly for 10.5 hrs Life Vest No, has an ICD/defibrillator in place Oxygen NO Special Bed No Trach Size No Wound Vac (area) Yes      Location L BKA site for 7 days Skin:  New L BKA post op incision with VAC in place                             Bowel mgmt: Last BM 01/16/17 Bladder mgmt: Voiding in urinal Diabetic mgmt Yes, on insulin at home    Previous Home Environment Living Arrangements: Spouse/significant other  Lives With: Spouse Available Help at Discharge: Family, Available 24 hours/day Type of Home: Mobile home Home Layout: One level Home Access: Stairs to enter Entrance Stairs-Rails: Left Entrance Stairs-Number of Steps: 1 Bathroom Shower/Tub: Psychologist, counselling, Sport and exercise psychologist: Standard Home Care Services: No  Discharge Living Setting Plans for Discharge Living Setting: Lives with (comment), Mobile Home (Lives with wife in a mobile home.) Type of Home at Discharge: Mobile home (Single wide.) Discharge Home Layout: One level Discharge Home Access: Stairs to enter Entrance Stairs-Number of Steps: 1 step up and then threshold step into mobile home Does the patient have any problems obtaining your medications?: No  Social/Family/Support Systems Patient Roles: Spouse, Parent (Has a wife, daughter and a son.) Contact Information: Kholton Coate -  wife Anticipated Caregiver: Wife Anticipated Caregiver's Contact Information: Burna Mortimer - wife - [phone] Ability/Limitations of Caregiver: Wife does not work and can assist at home. Caregiver Availability: 24/7 Discharge Plan Discussed with Primary Caregiver: Yes Is Caregiver In Agreement with Plan?: Yes Does Caregiver/Family have Issues with Lodging/Transportation while Pt is in Rehab?: No   Goals/Additional Needs Patient/Family Goal for Rehab: PT/OT supervision to min assist goals Expected length of stay: 14-18 days Cultural Considerations: None Dietary Needs: Carb mod, med cal, thin liquids Equipment Needs: TBD Special Service Needs: On Peritoneal dialysis 10.5 hours each night. Pt/Family Agrees to Admission and willing to participate: Yes Program Orientation Provided & Reviewed with Pt/Caregiver Including Roles  & Responsibilities: Yes  Decrease burden of Care through IP rehab admission: N/A  Possible need for SNF placement upon discharge: No, with PD, SNF is not an option.  Patient Condition: This patient's medical and functional status has changed since the consult dated: 12/26/16 in which the Rehabilitation Physician determined and documented that the patient's condition is appropriate for intensive rehabilitative care in an inpatient rehabilitation facility. See "History of Present Illness" (above) for medical update. Functional changes are: Currently requiring max to total assist +2 for transfers. Patient's medical and functional status update has been discussed with the Rehabilitation physician and patient remains appropriate for inpatient rehabilitation. Will admit to inpatient rehab today.  Preadmission Screen Completed By:  Trish Mage, 01/17/2017 3:32 PM ______________________________________________________________________   Discussed status with Dr. Riley Kill on 01/17/17 at 1531 and received telephone approval for admission today.  Admission Coordinator:  Trish Mage, time 1532/Date 01/17/17

## 2017-01-17 NOTE — Progress Notes (Signed)
Patient transferred to In-patient rehab. Patient demonstrates no distress. Wife with patient. All personal belongings with pt. Report given to [facility].

## 2017-01-17 NOTE — Progress Notes (Signed)
PROGRESS NOTE    [name]  ZOX:[id number] DOB: [birth year] DOA: 01/06/2017 PCP: Dorice [name], MD   Brief Narrative: [name] is a 76 y.o. malewith hx of HTN, CVA, ESRD on PD, DM on insulin, COPD and CHFw AICD, hx CAD/ CABG 2011. Pt was admitted on 12/15/16 for non-healing L foot wound and R groin pseudoaneurysm, underwent repair of PA R groin and also resection of left 3rd metatarsal head by Dr Randie Heinz. However, the left foot progressed and developed gangrenous changes, so on 9/14 pt went for L transmetatarsal amputation by Dr Lajoyce Corners. Post op abx were stopped and pt admitted to rehab on 12/27/16.   On 9/23 pt had fevers and persistent WBC and tachycardia. Started on [place] due to concerns of possible infection. UcX and Blood cx's were negative. CXR was negative. Pt had increasing L foot pain w/ some skin changes/ blistering and serous drainage from L TMA wound. Dr Lajoyce Corners was consulted and per the chart, possible plan is for surgical intervention next week. Discussed with Dr. Lajoyce Corners and will see the patient Monday. C/w Abx for now.   Patient developed Chest Pain on 9/30 so he was given full dose ASA, SL NTG, and a Stat EKG and Troponin was ordered. EKG reviewed and showed significant ST Depressions (agreed by Cardiologist Dr. Donnie Aho) so Cardiology was consulted and patient was placed on a NTG gtt and Heparin gtt and transferred to SDU. Patient underwent catheterization and required no stenting and there was no critical stenosis identified. Cardiology cleared the patient and plan is for Left BKA in AM.    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 Wound vac in place. Antibiotics discontinued -orthopedic surgery recommendations: wound vac x1 week, staples out x2 weeks  Elevated AST/ALT Asymptomatic. Recent ultrasound significant for gallbladder polyp. RUQ ultrasound on 10/3 significant for sludge and shadow. Continues to trend down.  Acute chest pain NSTEMI S/p cath without critical stenosis. Cardiology recommended okay for surgery. Nitroglycerin and heparin drip discontinued. -continue Plavix and Aspirin  ESRD on PD -nephrology recommendations  History of CVA -continue aspirin and Plavix -holding atorvastatin with elevated transaminases  PVD S/p left peroneal artery revascularization. -continue aspirin and Plavix  Anemia of CKD Stable  Hypotension -continue Midodrine  CAD Hx of CABG ICD -continue carvedilol, aspirin and Plavix -isosorbide mononitrate discontinued by cardiology -atorvastatin on hold secondary to elevated transaminases. Restart when improved.  Hypokalemia Hypomagensemia -Replete as needed  COPD No exacerbation. Stable.  Chronic systolic CHF No exacerbation. ICD in place. Last EF of 45% -continue strict I/O, daily weights  Hyponatremia Chronic. Stable.  Right toe ulcer Chronic. Patient has had successful revascularization of the right limb. No evidence of erythema or purulent drainage to suggest infection.  -defer to wound care/orthopedic surgery with regard to wound care/possible debridement  Constipation Likely opiate induced. Passing gas. No abdominal pain. Had multiple bowel movements yesterday -schedule Miralax, titrate for bowel movements -continue  Senna and Colace   DVT prophylaxis: subq heparin Code Status: Full code Family Communication: Wife at  bedside Disposition Plan: CIR   Consultants:   Orthopedic surgery  Nephrology  Cardiology  Procedures:  CARDIAC CATHETERIZATION  Ost RCA to Prox RCA lesion, 0 %stenosed.  LM lesion, 100 %stenosed.  LIMA.  SVG.  Origin lesion, 100 %stenosed.  SVG.  Dist Graft to Insertion lesion, 40 %stenosed.  1st Mrg lesion, 75 %stenosed.  Ost Cx to Prox Cx lesion, 80 %stenosed.  Ost 3rd Mrg lesion, 100 %stenosed.  Ost 1st Mrg lesion, 100 %stenosed.  Mid Cx lesion, 60 %stenosed.  Antimicrobials:  Ceftazidime  Daptomycin     Subjective: No concerns today  Objective: Vitals:   01/16/17 1452 01/16/17 2030 01/17/17 0438 01/17/17 0810  BP: 103/65 (!) 138/58 (!) 157/58 (!) 124/50  Pulse: 92 100 98 91  Resp:  Temp:  97.8 F (36.6 C) (!) 97.1 F (36.2 C) 98.7 F (37.1 C)  TempSrc:  Oral Axillary Oral  SpO2:  99% 99%   Weight:    86.7 kg (191 lb 2.2 oz)  Height:        Intake/Output Summary (Last 24 hours) at 01/17/17 1213 Last data filed at 01/17/17 1100  Gross per 24 hour  Intake            25119 ml  Output            27158 ml  Net            -2039 ml   Filed Weights   01/16/17 0500 01/16/17 1420 01/17/17 0810  Weight: 91 kg (200 lb 9.9 oz) 87.2 kg (192 lb 3.9 oz) 86.7 kg (191 lb 2.2 oz)    Examination:  General exam: Appears calm and comfortable Respiratory system: Clear to auscultation bilaterally. Unlabored work of breathing. No wheezing or rales. Cardiovascular system: S1 & S2 heard, RRR. No murmurs. Right DP palpable. Gastrointestinal system: Abdomen is distended, soft and nontender. Normal bowel sounds heard. Central nervous system: Alert and oriented. No focal neurological deficits. Extremities: No edema. No calf tenderness. Left BKA. Skin: No cyanosis. Right 5th digit ulcer with eschar.  Psychiatry: Judgement and insight appear normal. Mood & affect appropriate.     Data Reviewed: I have personally reviewed following labs and  imaging studies  CBC:  Recent Labs Lab 01/11/17 0216 01/12/17 0916 01/13/17 0238 01/15/17 0802  WBC 9.9 10.5 8.1 11.2*  NEUTROABS 7.9*  --   --   --   HGB 9.1* 8.4* 7.9* 7.7*  HCT 28.2* 26.6* 25.1* 24.5*  MCV 94.9 96.0 95.4 96.5  PLT 468* 437* 404* 436*   Basic Metabolic Panel:  Recent Labs Lab 01/11/17 0216 01/12/17 0916 01/13/17 0238 01/14/17 0416 01/15/17 0212 01/16/17 0444  NA 128* 130* 130* 130* 132* 133*  K 4.9 3.7 3.7 3.6 3.3* 3.7  CL 90* 92* 91* 92* 91* 92*  CO2 GLUCOSE 273* 335* 360* 328* 348* 145*  BUN 47* 46* 46* 49* 50* 61*  CREATININE 6.14* 7.00* 6.94* 7.26* 7.30* 8.20*  CALCIUM 8.1* 7.9* 7.9* 8.1* 8.5* 8.5*  MG 2.0  --   --   --   --   --   PHOS 5.0*  --  5.4*  --   --  4.6   GFR: Estimated Creatinine Clearance: 8.5 mL/min (A) (by C-G formula based on SCr of 8.2 mg/dL (H)). Liver Function Tests:  Recent Labs Lab  01/11/17 0216 01/12/17 0916 01/13/17 0238 01/14/17 0416 01/15/17 0212 01/16/17 0444  AST 566* 437* 308* 185* 132*  --   ALT 105* 52 19 9* 7*  --   ALKPHOS 67 60 61 58 64  --   BILITOT 2.2* 0.3 0.5 0.5 0.4  --   PROT 5.6* 5.6* 5.4* 5.6* 5.8*  --   ALBUMIN 1.4* 1.3* 1.3* 1.3* 1.3* 1.4*   No results for input(s): LIPASE, AMYLASE in the last 168 hours. No results for input(s): AMMONIA in the last 168 hours. Coagulation Profile: No results for input(s): INR, PROTIME in the last 168 hours. Cardiac Enzymes: No results for input(s): CKTOTAL, CKMB, CKMBINDEX, TROPONINI in the last 168 hours. BNP (last 3 results) No results for input(s): PROBNP in the last 8760 hours. HbA1C: No results for input(s): HGBA1C in the last 72 hours. CBG:  Recent Labs Lab 01/16/17 1134 01/16/17 1707 01/16/17 2125 01/17/17 0617 01/17/17 1146  GLUCAP 304* 272* 361* 387* 309*   Lipid Profile: No results for input(s): CHOL, HDL, LDLCALC, TRIG, CHOLHDL, LDLDIRECT in the last 72 hours. Thyroid Function Tests: No results for input(s):  TSH, T4TOTAL, FREET4, T3FREE, THYROIDAB in the last 72 hours. Anemia Panel: No results for input(s): VITAMINB12, FOLATE, FERRITIN, TIBC, IRON, RETICCTPCT in the last 72 hours. Sepsis Labs: No results for input(s): PROCALCITON, LATICACIDVEN in the last 168 hours.  Recent Results (from the past 240 hour(s))  MRSA PCR Screening     Status: None   Collection Time: 01/08/17  4:15 PM  Result Value Ref Range Status   MRSA by PCR NEGATIVE NEGATIVE Final    Comment:        The GeneXpert MRSA Assay (FDA approved for NASAL specimens only), is one component of a comprehensive MRSA colonization surveillance program. It is not intended to diagnose MRSA infection nor to guide or monitor treatment for MRSA infections.   Surgical pcr screen     Status: None   Collection Time: 01/11/17  4:00 AM  Result Value Ref Range Status   MRSA, PCR NEGATIVE NEGATIVE Final   Staphylococcus aureus NEGATIVE NEGATIVE Final    Comment: (NOTE) The Xpert SA Assay (FDA approved for NASAL specimens in patients 57 years of age and older), is one component of a comprehensive surveillance program. It is not intended to diagnose infection nor to guide or monitor treatment.          Radiology Studies: No results found.      Scheduled Meds: . aspirin  81 mg Oral Daily  . calcium acetate  667 mg Oral TID WC  . carvedilol  12.5 mg Oral QHS  . clopidogrel  75 mg Oral Q breakfast  . darbepoetin (ARANESP) injection - NON-DIALYSIS  200 mcg Subcutaneous Q Tue-1800  . docusate sodium  100 mg Oral BID  . feeding supplement (NEPRO CARB STEADY)  237 mL Oral BID BM  . feeding supplement (PRO-STAT SUGAR FREE 64)  30 mL Oral BID  . gentamicin cream  1 application Topical Daily  . heparin subcutaneous  5,000 Units Subcutaneous Q8H  . insulin aspart  0-9 Units Subcutaneous TID WC  . insulin NPH Human  40 Units Subcutaneous QAC breakfast   And  . insulin NPH Human  50 Units Subcutaneous QHS  . midodrine  5 mg Oral  BID WC  . multivitamin  1 tablet Oral QHS  . polyethylene glycol  17 g Oral Daily  . senna  1 tablet Oral Daily  . sodium chloride flush  3 mL Intravenous Q12H  . sodium chloride flush  3 mL Intravenous Q12H  . Vitamin D (Ergocalciferol)  50,000 Units Oral Q30 days   Continuous Infusions: . sodium chloride    . sodium chloride 10 mL/hr at 01/11/17 1005  . dialysis solution 2.5% low-MG/low-CA       LOS: 11 days     Jacquelin Hawking, MD [facility] 01/17/2017, 12:13 PM Pager: [phone]  If 7PM-7AM, please contact night-coverage [email or web address] Password [email or web address] 01/17/2017, 12:13 PM

## 2017-01-17 NOTE — Progress Notes (Signed)
[facility] ROUNDING NOTE   Subjective:   Interval History:75 y.o.malewith hx of HTN, CVA, ESRD on PD, DM on insulin, COPD and CHFw AICD, hx CAD/ CABG 2011. Pt was admitted on 12/15/16 for non-healing L foot wound and R groin pseudoaneurysm, underwent repair of PA R groin and also resection of left 3rd metatarsal head by Dr Randie Heinz. However, the left foot progressed and developed gangrenous changes, so on 9/14 pt went for L transmetatarsal amputation by Dr Lajoyce Corners.He had a cardiac catheterization demonstrating no critical stenosis and cleared for surgery He continues on peritoneal dialysis(home = 5 2.5 L fills, all 2.5% dialysate, 10 min/1.5 hours/20 min fill/dwell/drain cycle with pause and daytime dwell; altered in the hospital for ease, doing fine)    Objective:  Vital signs in last 24 hours:  Temp:  [97.1 F (36.2 C)-98.7 F (37.1 C)] 98.7 F (37.1 C) (10/09 0810) Pulse Rate:  [90-100] 91 (10/09 0810) Resp:  [16-20] 20 (10/09 0810) BP: (81-157)/(43-65) 124/50 (10/09 0810) SpO2:  [99 %-100 %] 99 % (10/09 0438) Weight:  [191 lb 2.2 oz (86.7 kg)-192 lb 3.9 oz (87.2 kg)] 191 lb 2.2 oz (86.7 kg) (10/09 0810)  Weight change: -8 lb 6 oz (-3.8 kg) Filed Weights   01/16/17 0500 01/16/17 1420 01/17/17 0810  Weight: 200 lb 9.9 oz (91 kg) 192 lb 3.9 oz (87.2 kg) 191 lb 2.2 oz (86.7 kg)    Intake/Output: I/O last 3 completed shifts: In: 76195 [P.O.:120; Other:12504] Out: 09326 [Other:13634]   Intake/Output this shift:  Total I/O In: 12495 [Other:12495] Out: 71245 [Other:13524]  CVS RRR Lungs diminished at bases otherwise clear anteriorly Abdomen +BS, soft and not tender. PD catheter left abd with dry dressings Trace -1+ RLE edema L TMA wrapped and some LLE edema Dialysis Access: PD cath, left upper arm AVF   Basic Metabolic Panel:  Recent Labs Lab 01/11/17 0216 01/12/17 0916 01/13/17 0238 01/14/17 0416 01/15/17 0212 01/16/17 0444  NA 128* 130* 130* 130* 132*  133*  K 4.9 3.7 3.7 3.6 3.3* 3.7  CL 90* 92* 91* 92* 91* 92*  CO2 GLUCOSE 273* 335* 360* 328* 348* 145*  BUN 47* 46* 46* 49* 50* 61*  CREATININE 6.14* 7.00* 6.94* 7.26* 7.30* 8.20*  CALCIUM 8.1* 7.9* 7.9* 8.1* 8.5* 8.5*  MG 2.0  --   --   --   --   --   PHOS 5.0*  --  5.4*  --   --  4.6    Liver Function Tests:  Recent Labs Lab 01/11/17 0216 01/12/17 0916 01/13/17 0238 01/14/17 0416 01/15/17 0212 01/16/17 0444  AST 566* 437* 308* 185* 132*  --   ALT 105* 52 19 9* 7*  --   ALKPHOS 67 60 61 58 64  --   BILITOT 2.2* 0.3 0.5 0.5 0.4  --   PROT 5.6* 5.6* 5.4* 5.6* 5.8*  --   ALBUMIN 1.4* 1.3* 1.3* 1.3* 1.3* 1.4*   No results for input(s): LIPASE, AMYLASE in the last 168 hours. No results for input(s): AMMONIA in the last 168 hours.  CBC:  Recent Labs Lab 01/11/17 0216 01/12/17 0916 01/13/17 0238 01/15/17 0802  WBC 9.9 10.5 8.1 11.2*  NEUTROABS 7.9*  --   --   --   HGB 9.1* 8.4* 7.9* 7.7*  HCT 28.2* 26.6* 25.1* 24.5*  MCV 94.9 96.0 95.4 96.5  PLT 468* 437* 404* 436*    Cardiac Enzymes: No results for input(s): CKTOTAL,  CKMB, CKMBINDEX, TROPONINI in the last 168 hours.  BNP: Invalid input(s): POCBNP  CBG:  Recent Labs Lab 01/16/17 0611 01/16/17 1134 01/16/17 1707 01/16/17 2125 01/17/17 0617  GLUCAP 183* 304* 272* 361* 387*    Microbiology: Results for orders placed or performed during the hospital encounter of 01/06/17  MRSA PCR Screening     Status: None   Collection Time: 01/08/17  4:15 PM  Result Value Ref Range Status   MRSA by PCR NEGATIVE NEGATIVE Final    Comment:        The GeneXpert MRSA Assay (FDA approved for NASAL specimens only), is one component of a comprehensive MRSA colonization surveillance program. It is not intended to diagnose MRSA infection nor to guide or monitor treatment for MRSA infections.   Surgical pcr screen     Status: None   Collection Time: 01/11/17  4:00 AM  Result Value Ref Range Status    MRSA, PCR NEGATIVE NEGATIVE Final   Staphylococcus aureus NEGATIVE NEGATIVE Final    Comment: (NOTE) The Xpert SA Assay (FDA approved for NASAL specimens in patients 107 years of age and older), is one component of a comprehensive surveillance program. It is not intended to diagnose infection nor to guide or monitor treatment.     Coagulation Studies: No results for input(s): LABPROT, INR in the last 72 hours.  Urinalysis: No results for input(s): COLORURINE, LABSPEC, PHURINE, GLUCOSEU, HGBUR, BILIRUBINUR, KETONESUR, PROTEINUR, UROBILINOGEN, NITRITE, LEUKOCYTESUR in the last 72 hours.  Invalid input(s): APPERANCEUR    Imaging: No results found.   Medications:   . sodium chloride    . sodium chloride 10 mL/hr at 01/11/17 1005  . dialysis solution 2.5% low-MG/low-CA     . aspirin  81 mg Oral Daily  . calcium acetate  667 mg Oral TID WC  . carvedilol  12.5 mg Oral QHS  . clopidogrel  75 mg Oral Q breakfast  . darbepoetin (ARANESP) injection - NON-DIALYSIS  200 mcg Subcutaneous Q Tue-1800  . docusate sodium  100 mg Oral BID  . feeding supplement (NEPRO CARB STEADY)  237 mL Oral BID BM  . feeding supplement (PRO-STAT SUGAR FREE 64)  30 mL Oral BID  . gentamicin cream  1 application Topical Daily  . heparin subcutaneous  5,000 Units Subcutaneous Q8H  . insulin aspart  0-9 Units Subcutaneous TID WC  . insulin NPH Human  40 Units Subcutaneous QAC breakfast   And  . insulin NPH Human  50 Units Subcutaneous QHS  . midodrine  5 mg Oral BID WC  . multivitamin  1 tablet Oral QHS  . polyethylene glycol  17 g Oral Daily  . senna  1 tablet Oral Daily  . sodium chloride flush  3 mL Intravenous Q12H  . sodium chloride flush  3 mL Intravenous Q12H  . Vitamin D (Ergocalciferol)  50,000 Units Oral Q30 days   sodium chloride, acetaminophen **OR** acetaminophen, bisacodyl, dianeal solution for CAPD/CCPD with heparin, hydrALAZINE, lactulose, metoCLOPramide **OR** metoCLOPramide (REGLAN)  injection, morphine injection, nitroGLYCERIN, ondansetron **OR** ondansetron (ZOFRAN) IV, oxyCODONE-acetaminophen, sodium chloride flush, sodium chloride flush  Assessment/ Plan:  1. PAD- s/p open repair of R common femoral artery pseudoaneurysm (VVS) and L transmet 12/23/16 (Dr. Lajoyce Corners). On Fortaz and dapto - will now need BKA and cardiology has cleared 2. Chest pain/+ troponins. Prior CABG. Had stent to RCA 07/2016 d/t NSTEMI. Has ICD in place. Recurrent CP/+ trops. Cath 10/1 no intervention required.  3. ESRD- CCPD, using 2.5% dextrose at home; Altered  regimen for ease in hospital (no pause or day bag) Had been using 1/2 and 1/2 past few days  Using all 2.5 % bags for now BP controlled for now  4. Orthostatic hypotension - midodrine 5. Anemia (ABLA)- TSAT 45% and Ferritin 2284, ^Aranesp 200 qThurs. Not given yet this admission that I can see (ordered "with HD" which may be the issue). Reordered SQ for today. 6. Constipation: miralax as needed 7. Hypokalemia: Monitor for need for replacement. Give 30 po today.  8. Myoclonus / Confusion: stopped gabapentin and robaxin. Not confused past 2 days that I can see 9. Secondary HPT - phoslo. Regular diet to promote nutrition 10. DM - per primary team 11. Low grade temp- blood cultures neg- fluid cell count fine - foot thought to be source- will need BKA.     LOS: 11 Lovis More W  :31 AM

## 2017-01-17 NOTE — Progress Notes (Signed)
Physical Therapy Treatment Patient Details Name: [name] MRN: [id number] DOB: 04-01-1941 Today's Date: 01/17/2017    History of Present Illness Arnett Duddy Comptonis a 76 y.o.malewith hx of HTN, CVA, ESRD on PD, DM on insulin, COPD and CHFw AICD, hx CAD/ CABG 2011. Pt was admitted on 12/15/16 for non-healing L foot wound and R groin pseudoaneurysm, underwent repair of PA R groin and also resection of left 3rd metatarsal head by Dr Randie Heinz. However, the left foot progressed and developed gangrenous changes, so on 9/14 pt went for L transmetatarsal amputation by Dr Lajoyce Corners. pt admitted to rehab on 12/27/16. Pt presented with further gangrenous changes post transmetatarsal amputation and Dr. Lajoyce Corners performed a left transtibial amputation (01-11-17) with placement of a prevena wound vac.  Pt has plans to return back to CIR.      PT Comments    Pt performed increased activity and performed progression to WC mobility out of room.  Pt required mod assistance for lateral scoot transfers, and min assist for WC mobility.  Pt fatigues quickly with WC mobility.  Pt presents with flat demeanor and minimal complaints of pain.  Pt's wife pleased with patient progress.  Plan remains appropriate for CIR placement post acute hospitalization before returning home to private residence.   Follow Up Recommendations  CIR     Equipment Recommendations  Wheelchair (measurements PT);Wheelchair cushion (measurements PT) (sliding board, Elevating leg rests on WC.  )    Recommendations for Other Services Rehab consult     Precautions / Restrictions Precautions Precautions: Fall Precaution Comments: PD port, VAC left residual limb Required Braces or Orthoses: Other Brace/Splint Restrictions Weight Bearing Restrictions: Yes LLE Weight Bearing: Non weight bearing    Mobility  Bed Mobility               General bed mobility comments: Pt sitting on side of bed on arrival with wife.  Pt with flexed    Transfers Overall transfer level: Needs assistance Equipment used:  (none) Transfers: Lateral/Scoot Transfers          Lateral/Scoot Transfers: Mod assist General transfer comment: Pt performed lateral scoot from bed to WC and WC to drop arm recliner chair.  Pt required cues for hand placement, forward weight shifting and scooting from surface to surface.    Ambulation/Gait Ambulation/Gait assistance:  (Pt is not safe to ambulate at this time due to weakness.  )               Administrator mobility: Yes Wheelchair propulsion: Both upper extremities;Right lower extremity Wheelchair parts: Needs assistance Distance: 80 ft, min assist for forward propulsion and cues for turning, backing and using RLE.  Pt fatigues quickly and required rest breaks x 2.   Wheelchair Assistance Details (indicate cue type and reason): Pt required total assistance for setup of WC for transfers, locking brakes, and WC parts.    Modified Rankin (Stroke Patients Only)       Balance     Sitting balance-Leahy Scale: Fair       Standing balance-Leahy Scale:  (NT)                              Cognition Arousal/Alertness: Awake/alert Behavior During Therapy: Flat affect Overall Cognitive Status: Within Functional Limits for tasks assessed Area of Impairment: Attention;Following commands;Problem solving  Following Commands: Follows one step commands inconsistently;Follows one step commands with increased time     Problem Solving: Slow processing;Decreased initiation;Difficulty sequencing;Requires verbal cues;Requires tactile cues        Exercises      General Comments        Pertinent Vitals/Pain Pain Assessment: Faces Faces Pain Scale: Hurts little more Pain Location: Lt LE with transfers  Pain Descriptors / Indicators: Operative site guarding;Grimacing Pain Intervention(s):  Monitored during session;Repositioned    Home Living                      Prior Function            PT Goals (current goals can now be found in the care plan section) Acute Rehab PT Goals Patient Stated Goal: To rehab then home  Potential to Achieve Goals: Fair Additional Goals Additional Goal #1: Pt indep with w/c part management and set up for transfers to/from bed/chair. Additional Goal #2: Pt indep with w/c propulsion >125 including turns. Progress towards PT goals: Progressing toward goals    Frequency    Min 3X/week      PT Plan Current plan remains appropriate    Co-evaluation              AM-PAC PT "6 Clicks" Daily Activity  Outcome Measure  Difficulty turning over in bed (including adjusting bedclothes, sheets and blankets)?: Unable Difficulty moving from lying on back to sitting on the side of the bed? : Unable Difficulty sitting down on and standing up from a chair with arms (e.g., wheelchair, bedside commode, etc,.)?: Unable Help needed moving to and from a bed to chair (including a wheelchair)?: A Lot Help needed walking in hospital room?: Total Help needed climbing 3-5 steps with a railing? : Total 6 Click Score: 7    End of Session Equipment Utilized During Treatment: Gait belt Activity Tolerance: Patient limited by fatigue Patient left: with call bell/phone within reach;with family/visitor present;in chair;with chair alarm set Nurse Communication: Mobility status;Need for lift equipment PT Visit Diagnosis: Other abnormalities of gait and mobility (R26.89);Muscle weakness (generalized) (M62.81);Difficulty in walking, not elsewhere classified (R26.2) Pain - Right/Left: Left Pain - part of body: Knee     Time: 4098-1191 PT Time Calculation (min) (ACUTE ONLY): 38 min  Charges:  $Therapeutic Activity: 23-37 mins $Wheel Chair Management: 8-22 mins                    G Codes:       Joycelyn Rua, PTA pager [phone]    Florestine Avers 01/17/2017, 2:35 PM

## 2017-01-17 NOTE — H&P (Signed)
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 Donzetta Matters. Hospital course complicated by N/V, leucocytosis and fever likely due to sepsis. GB ultrasound revealed GB polyp without acute cholecystitis.  He has had difficulty swallowing due to oral pharyngeal ulcers and Dr. Constance Holster recommended supportive care for management. He has had issues with right groin wound dehiscence of wound with gangrenous changes left foot. 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 Sharol Given on 9/14 and right groin hematoma showed decrease in erythema.  He was admitted to CIR on 12/27/2016 due to deficits in mobility and decreased ability to carry out ADL tasks. His was noted to have hypotensive episodes with fatigue and frequently during the pm sessions. Nephrology has been following for management of PD and decreased amount of fluid removed with PD. Insulin has been titrated upwards slowly to prevent hypoglycemic episodes as blood sugars have been difficulty to control due to variable intake.  He developed increase in pain left foot with ischemic changes and skin break down requiring surgical intervention. He was discharged to acute on 01/06/17 due to ongoing fevers, delirium with hallucinations,  hypotension and concerns of developing sepsis.  He did develop jaw pain radiating to chest on 9/30 with SEMI and elevated troponin.  He was started on IV heparin and Cardiac cath did not show critical stenosis--he was felt to be moderate risk for surgery.  He underwent left transtibial amputation on 10/03 by Dr. Sharol Given and has defervesced. Therapy ongoing and patient with decreased balance, difficulty following simple  command consistently as well as debility compounded by L-BKA. CIR recommended to resume rehab course.     Review of Systems  Constitutional: Negative for chills and fever.       Hard to chew--difficulty opening mouth wide  HENT: Negative for hearing loss and tinnitus.   Eyes: Positive for blurred vision (right eye).  Respiratory: Negative for cough and shortness of breath.   Cardiovascular: Negative for chest pain and palpitations.  Gastrointestinal: Negative for abdominal pain, constipation, heartburn, nausea and vomiting.  Genitourinary: Negative for dysuria.  Musculoskeletal: Positive for myalgias.  Neurological: Positive for sensory change, focal weakness and weakness. Negative for dizziness and headaches.  Psychiatric/Behavioral: Positive for memory loss. The patient is nervous/anxious. The patient does not have insomnia.       Past Medical History:  Diagnosis Date  . AICD (automatic cardioverter/defibrillator) present   . CHF (congestive heart failure) ([place])   . COPD (chronic obstructive pulmonary disease) ([place])   . Coronary artery disease    CABG 2011  . Diabetes mellitus without complication ([place])   . ESRD on peritoneal dialysis [facility])    Started PD Dec 2016 in [place]. Nephrology is in [place].   [name] History of peritonitis    PD cath related peritonitis in April 2017  . Hypertension   . Peripheral vascular disease ([place])   . Sjogren's syndrome ([place])    Per pt's wife, was diagnosed in 2016 during w/u for cause of renal failure prior to starting dialysis.  He had a rash on his chest apparently prompting this w/u.  Never had renal bx.  He was referred to a rheum MD in  [place] and per the wife he was treated with MTX and other medications but had side effects to "all of it" and isn't taking anything for this as of Jun 2017.   . Stroke [facility])     Past Surgical History:  Procedure Laterality Date  . ABDOMINAL AORTOGRAM W/LOWER EXTREMITY N/A 11/29/2016   Procedure: ABDOMINAL  AORTOGRAM W/LOWER EXTREMITY;  Surgeon: Serafina Mitchell, MD;  Location: [place] [facility];  Service: Cardiovascular;  Laterality: N/A;  . AMPUTATION Left 11/29/2016   Procedure: LEFT THIRD TOE AMPUTATION;  Surgeon: Serafina Mitchell, MD;  Location: [facility] OR;  Service: Vascular;  Laterality: Left;  . AMPUTATION Left 12/23/2016   Procedure: Left Transmetatarsal Amputation;  Surgeon: Newt Minion, MD;  Location: [place];  Service: Orthopedics;  Laterality: Left;  . AMPUTATION Left 01/11/2017   Procedure: LEFT BELOW KNEE AMPUTATION;  Surgeon: Newt Minion, MD;  Location: [place];  Service: Orthopedics;  Laterality: Left;  . ANGIOPLASTY  12/15/2016   Procedure: BALLOON ANGIOPLASTY;  Surgeon: Waynetta Sandy, MD;  Location: [place];  Service: Vascular;;  . cardiac catherization with stent placement  2017  . CORONARY ARTERY BYPASS GRAFT  2011  . FALSE ANEURYSM REPAIR Right 12/15/2016   Procedure: REPAIR FALSE ANEURYSM;  Surgeon: Waynetta Sandy, MD;  Location: [place];  Service: Vascular;  Laterality: Right;  . IRRIGATION AND DEBRIDEMENT FOOT Left 12/15/2016   Procedure: IRRIGATION AND DEBRIDEMENT FOOT;  Surgeon: Waynetta Sandy, MD;  Location: [place];  Service: Vascular;  Laterality: Left;  . LEFT HEART CATH AND CORS/GRAFTS ANGIOGRAPHY N/A 01/09/2017   Procedure: LEFT HEART CATH AND CORS/GRAFTS ANGIOGRAPHY;  Surgeon: Lorretta Harp, MD;  Location: [place] [facility];  Service: Cardiovascular;  Laterality: N/A;  . LOWER EXTREMITY ANGIOGRAM Left 12/15/2016   Procedure: LOWER EXTREMITY ANGIOGRAM; PEDAL ACCESS;  Surgeon: Waynetta Sandy, MD;  Location: [place];  Service: Vascular;  Laterality: Left;  . PERIPHERAL VASCULAR BALLOON ANGIOPLASTY  11/29/2016   Procedure: PERIPHERAL VASCULAR BALLOON ANGIOPLASTY;  Surgeon: Serafina Mitchell, MD;  Location: [place] [facility];  Service: Cardiovascular;;  LT Peroneal AT attempted unsuccessful    Family History  Problem Relation Age of Onset    . Heart disease Father     Social History:  Married. Retired Dealer. Wife supportive and can provide assistance after discharge. He reports that he has quit smoking 25 years ago. He has never used smokeless tobacco. He reports that he does not drink alcohol or use drugs.    Allergies  Allergen Reactions  . Lisinopril Hives  . Methotrexate Derivatives Other (See Comments)    blisters  . Sulfa Antibiotics Hives  . Tape Hives  . Vancomycin Other (See Comments)    Blisters   . Zanaflex [Tizanidine Hcl]     Hypotensive stroke    Medications Prior to Admission  Medication Sig Dispense Refill  . aspirin EC 81 MG tablet Take 81 mg by mouth daily.    [name] atorvastatin (LIPITOR) 80 MG tablet Take 80 mg by mouth daily at 6 PM.    . calcium acetate (PHOSLO) 667 MG capsule Take 667 mg by mouth 3 (three) times daily with meals.    . carvedilol (COREG) 12.5 MG tablet Take 12.5 mg by mouth 2 (two) times daily with a meal.    . clopidogrel (PLAVIX) 75 MG tablet Take 75 mg by mouth daily.    [name] docusate sodium (COLACE) 100 MG capsule Take 100 mg by mouth 2 (  two) times daily.     . insulin NPH Human (HUMULIN N,NOVOLIN N) 100 UNIT/ML injection Inject 70 Units into the skin at bedtime.    . insulin regular (NOVOLIN R,HUMULIN R) 100 units/mL injection Inject 20 Units into the skin 3 (three) times daily before meals.    . isosorbide mononitrate (IMDUR) 30 MG 24 hr tablet Take 30 mg by mouth daily.    . nitroGLYCERIN (NITROSTAT) 0.4 MG SL tablet Place 0.4 mg under the tongue every 5 (five) minutes as needed for chest pain.    [name] ondansetron (ZOFRAN) 4 MG tablet Take 4 mg by mouth every 6 (six) hours as needed for nausea or vomiting.    [name] oxyCODONE-acetaminophen (PERCOCET/ROXICET) 5-325 MG tablet Take 1 tablet by mouth every 6 (six) hours as needed for severe pain. 20 tablet 0  . Vitamin D, Ergocalciferol, (DRISDOL) 50000 units CAPS capsule Take 50,000 Units by mouth every 30 (thirty) days.     .  [DISCONTINUED] furosemide (LASIX) 40 MG tablet Take 40 mg by mouth 2 (two) times daily.      Drug Regimen Review  Drug regimen was reviewed and remains appropriate with no significant issues identified  Home: Home Living Family/patient expects to be discharged to:: Inpatient rehab Living Arrangements: Spouse/significant other Available Help at Discharge: Family, Available 24 hours/day Type of Home: Mobile home Home Access: Stairs to enter Entrance Stairs-Number of Steps: 1 Entrance Stairs-Rails: Left Home Layout: One level Bathroom Shower/Tub: Gaffer, Charity fundraiser: Standard Home Equipment: Environmental consultant - 4 wheels, Radio producer - quad, Wheelchair - manual  Lives With: Spouse   Functional History: Prior Function Level of Independence: Needs assistance Gait / Transfers Assistance Needed: uses [name], RW ADL's / Homemaking Assistance Needed: A for socks, can bath self in shower Comments: reports hobbling around the house, using rollator as able. Legs would "give out" at times but denies having any falls.   Functional Status:  Mobility: Bed Mobility Overal bed mobility: Needs Assistance Bed Mobility: Supine to Sit Supine to sit: Mod assist Sit to supine: Min guard General bed mobility comments: Mod assist to push up into seated position. Pt able to use bed rails with feet of bed elevated to scoot up in bed.  Transfers Overall transfer level: Needs assistance Equipment used: Rolling walker (2 wheeled), 2 person hand held assist Transfer via Lift Equipment: Stedy Transfers: Sit to/from Stand Sit to Stand: Max assist, +2 physical assistance Stand pivot transfers: +2 physical assistance, Total assist (sitting on [place]) General transfer comment: assist for forward translation of trunk and assist to lift trunk.  Assist for balance      ADL: ADL Overall ADL's : Needs assistance/impaired Eating/Feeding: Set up, Sitting Grooming: Wash/dry hands, Wash/dry face, Oral care, Brushing  hair, Minimal assistance, Sitting Upper Body Bathing: Maximal assistance, Sitting Lower Body Bathing: Maximal assistance, Sitting/lateral leans, Bed level Upper Body Dressing : Maximal assistance, Sitting Lower Body Dressing: Total assistance, Sit to/from stand, Sitting/lateral leans Toilet Transfer: Maximal assistance, +2 for physical assistance, Stand-pivot, [facility] Toilet Transfer Details (indicate cue type and reason): Attempted transfer to [facility] but unable to complete due to weakness.  Toileting- Clothing Manipulation and Hygiene: Total assistance, Sit to/from stand Functional mobility during ADLs: Maximal assistance, +2 for physical assistance General ADL Comments: Pt able to complete sit to stand transfer x 2 with max assist +2.   Cognition: Cognition Overall Cognitive Status: Within Functional Limits for tasks assessed Orientation Level: Oriented X4 Cognition Arousal/Alertness: Awake/alert Behavior During Therapy: Flat affect Overall  Cognitive Status: Within Functional Limits for tasks assessed Area of Impairment: Attention, Following commands, Problem solving Current Attention Level: Sustained, Focused Following Commands: Follows one step commands inconsistently, Follows one step commands with increased time Problem Solving: Slow processing, Decreased initiation, Difficulty sequencing, Requires verbal cues, Requires tactile cues General Comments: Pt requires mod - max cues for motor planning.  Easily distracts.  Follow one step commands intermittently.  ? if cognitive deficits may be med related.  RN aware    Blood pressure (!) 124/50, pulse 91, temperature 98.7 F (37.1 C), temperature source Oral, resp. rate 20, height '5\' 9"'  (1.753 m), weight 86.7 kg (191 lb 2.2 oz), SpO2 99 %. Physical Exam  Nursing note and vitals reviewed. Constitutional: He appears well-nourished. No distress.  Up in [place].  HENT:  Head: Normocephalic and atraumatic.  Mouth/Throat: Oropharynx is clear  and moist.  Eyes: Pupils are equal, round, and reactive to light. Conjunctivae are normal.  Left ptosis  Neck: Normal range of motion. Neck supple.  Cardiovascular: Normal rate and regular rhythm.   Respiratory: Effort normal and breath sounds normal. No stridor. No respiratory distress. He has no wheezes.  GI: Soft. Bowel sounds are normal. He exhibits no distension. There is no tenderness.  Musculoskeletal: He exhibits edema and tenderness (right heel and foot extremely tender to touch. Right heel red and boggy. ).  Toes on right foot with mild rubor. Right fifth toe ulcer has increased in size with moist dark eschar on yellow wound bed. Right groin hematoma almost resolved but lower aspect on incision with ~ 4 mm dehiscence with yellow eschar.    Neurological: He is alert.  Easily agitated. Thought he was in [place] but able to correct with minimal cues. Able to follow simple motor commands.   Skin: Skin is warm and dry. He is not diaphoretic.  Abrasion on left ear resolved and small area on right ear lobe. Area on nasal bridge with dry dressing.   Psychiatric: His speech is normal. He is withdrawn.    Results for orders placed or performed during the hospital encounter of 01/06/17 (from the past 48 hour(s))  Glucose, capillary     Status: Abnormal   Collection Time: 01/15/17  4:23 PM  Result Value Ref Range   Glucose-Capillary 186 (H) 65 - 99 mg/dL   Comment 1 Notify RN    Comment 2 Document in Chart   Glucose, capillary     Status: Abnormal   Collection Time: 01/15/17  9:27 PM  Result Value Ref Range   Glucose-Capillary 140 (H) 65 - 99 mg/dL  Renal function panel     Status: Abnormal   Collection Time: 01/16/17  4:44 AM  Result Value Ref Range   Sodium 133 (L) 135 - 145 mmol/L   Potassium 3.7 3.5 - 5.1 mmol/L   Chloride 92 (L) 101 - 111 mmol/L   CO2 25 22 - 32 mmol/L   Glucose, Bld 145 (H) 65 - 99 mg/dL   BUN 61 (H) 6 - 20 mg/dL   Creatinine, Ser 8.20 (H) 0.61 - 1.24  mg/dL   Calcium 8.5 (L) 8.9 - 10.3 mg/dL   Phosphorus 4.6 2.5 - 4.6 mg/dL   Albumin 1.4 (L) 3.5 - 5.0 g/dL   GFR calc non Af Amer 6 (L) >60 mL/min   GFR calc Af Amer 7 (L) >60 mL/min    Comment: (NOTE) The eGFR has been calculated using the CKD EPI equation. This calculation has not been validated in all  clinical situations. eGFR's persistently <60 mL/min signify possible Chronic Kidney Disease.    Anion gap 16 (H) 5 - 15  Glucose, capillary     Status: Abnormal   Collection Time: 01/16/17  6:11 AM  Result Value Ref Range   Glucose-Capillary 183 (H) 65 - 99 mg/dL  Glucose, capillary     Status: Abnormal   Collection Time: 01/16/17 11:34 AM  Result Value Ref Range   Glucose-Capillary 304 (H) 65 - 99 mg/dL  Glucose, capillary     Status: Abnormal   Collection Time: 01/16/17  5:07 PM  Result Value Ref Range   Glucose-Capillary 272 (H) 65 - 99 mg/dL  Glucose, capillary     Status: Abnormal   Collection Time: 01/16/17  9:25 PM  Result Value Ref Range   Glucose-Capillary 361 (H) 65 - 99 mg/dL  Glucose, capillary     Status: Abnormal   Collection Time: 01/17/17  6:17 AM  Result Value Ref Range   Glucose-Capillary 387 (H) 65 - 99 mg/dL  Glucose, capillary     Status: Abnormal   Collection Time: 01/17/17 11:46 AM  Result Value Ref Range   Glucose-Capillary 309 (H) 65 - 99 mg/dL   No results found.     Medical Problem List and Plan: 1.  Functional deficits secondary to left BKA  -admit to inpatient rehab 2.  DVT Prophylaxis/Anticoagulation: Pharmaceutical: Heparin 3. Pain Management: Tylenol or Celebrex prn. Unable to tolerate muscle relaxers or pain medications due to SE of tremors, confusion and hallucinations.  4. Mood: LCSW to follow for evaluation and support.  5. Neuropsych: This patient is not fully capable of making decisions on his own behalf. 6. Skin/Wound Care: Routine pressure relief measures. Prevalon boot for right foot. Monitor daily for intake to promote  healing. Protein supplement and vitamins to promote wound healing.  7. Fluids/Electrolytes/Nutrition: Monitor I/O. Lytes/CBC per PD protocol. Hyponatremia improving. 8. Leucocytosis: WBC has fluctuated. Monitor for fevers and other sign of infection.  9. Hypotension: Monitor BP bid----are better on midodrine.  10. ESRD on PD: Schedule PD in evening and early am--unhook by breakfast.  11. Anemia of chronic disease: On aranesp every week.  12. T2DM insulin dependent: Brittle diabetic who's hard to control in part due to inconsistent intake--currently at 25%. BS ranging from 200- 300 likely due to supplements between meals. Change to Premier protein with meals.  13. Constipation: Had good BM yesterday with lactulose per wife. Continue Colace bid with lactulose prn. sorbitol today.  14. PVD with s/p left femoral pseudoaneurysm repair: Will have VVS follow up for evaluation in am. Order Prevalon boot for RLE with pressure relief measures.  13. Delirium: Improved but continues to have occasional hallucinations per wife.     Post Admission Physician Evaluation: 1. Functional deficits secondary  to left BKA. 2. Patient is admitted to receive collaborative, interdisciplinary care between the physiatrist, rehab nursing staff, and therapy team. 3. Patient's level of medical complexity and substantial therapy needs in context of that medical necessity cannot be provided at a lesser intensity of care such as a SNF. 4. Patient has experienced substantial functional loss from his/her baseline which was documented above under the "Functional History" and "Functional Status" headings.  Judging by the patient's diagnosis, physical exam, and functional history, the patient has potential for functional progress which will result in measurable gains while on inpatient rehab.  These gains will be of substantial and practical use upon discharge  in facilitating mobility and self-care at the household  level. 5. Physiatrist will provide 24 hour management of medical needs as well as oversight of the therapy plan/treatment and provide guidance as appropriate regarding the interaction of the two. 6. The Preadmission Screening has been reviewed and patient status is unchanged unless otherwise stated above. 7. 24 hour rehab nursing will assist with bladder management, bowel management, safety, skin/wound care, disease management, medication administration, pain management and patient education  and help integrate therapy concepts, techniques,education, etc. 8. PT will assess and treat for/with: Lower extremity strength, range of motion, stamina, balance, functional mobility, safety, adaptive techniques and equipment, pain mgt, pre-prosthetic education, family ed.   Goals are: supervision to min assist. 9. OT will assess and treat for/with: ADL's, functional mobility, safety, upper extremity strength, adaptive techniques and equipment, pain mgt, pre-prosthetic education, ego support, family ed.   Goals are: supervision to min assist. Therapy may not yet proceed with showering this patient. 10. SLP will assess and treat for/with: n/a.  Goals are: n/a. 11. Case Management and Social Worker will assess and treat for psychological issues and discharge planning. 12. Team conference will be held weekly to assess progress toward goals and to determine barriers to discharge. 13. Patient will receive at least 3 hours of therapy per day at least 5 days per week. 14. ELOS: 14-18 days       15. Prognosis:  excellent     Meredith Staggers, MD, [facility] 01/17/2017  Bary Leriche, PA-C 01/17/2017

## 2017-01-17 NOTE — Progress Notes (Signed)
Patient arrived approximately 1720. Patient alert and oriented to unit. Patient's wife at bedside and all questions answered. Patient given pain medication for LLE pain. Call bell left within reach. Vitals stable.

## 2017-01-17 NOTE — Discharge Summary (Signed)
Physician Discharge Summary  NICKOLIS DIEL ZOX:[id number] DOB: 25-Jan-1941 DOA: 01/06/2017  PCP: Dorice [name], MD  Admit date: 01/06/2017 Discharge date: 01/17/2017  Admitted From: CIR Disposition: CIR  Recommendations for Outpatient Follow-up:  1. Orthopedic surgery follow-up. Plan to discontinue wound vac and continue stump shrinker 2. Please obtain CMP/CBC in one week 3. Restart Lipitor once AST elevation resolved 4. Peritoneal dialysis 5. Please follow up on the following pending results: None  Home Health: CIR Equipment/Devices: Recommendations: Wheelchair (measurements PT);Wheelchair cushion (measurements PT) (sliding board, Elevating leg rests on WC.  )   Discharge Condition: Stable CODE STATUS: Full code Diet recommendation: Heart healthy/Renal  Brief/Interim Summary:  Admission HPI written by Delano Metz, MD   Chief Complaint: Fever r/o sepsis  HPI: [name] is a 76 y.o. male with hx of HTN, CVA, ESRD on PD, DM on insulin, COPD and CHFw AICD, hx CAD/ CABG 2011.  Pt was admitted on 12/15/16 for non-healing L foot wound and R groin pseudoaneurysm, underwent repair of PA R groin and also resection of left 3rd metatarsal head by Dr Randie Heinz.  However, the left foot progressed and developed gangrenous changes, so on 9/14 pt went for L transmetatarsal amputation by Dr Lajoyce Corners.  Post op abx were stopped and pt admitted to rehab on 12/27/16.      On 9/23 pt had fevers and persistent ^WBC and tachycardia.  Started on [place] due to concerns of possible infection.  UcX and Blood cx's were negative.  CXR was negative.  Pt had increasing L foot pain w/ some skin changes/ blistering and serous drainage from L TMA wound.  Dr Lajoyce Corners was consulted and per the chart, possible plan is for surgical intervention next week.   Pt denies any current SOB, CP, abd pain, n/v/d.  He is from [place].  On PD.   Hospital course:  Left foot infection with gangrene Started on empiric  Daptomycin and Ceftazidime. 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 Wound vac in place. Antibiotics discontinued. Orthopedic recommendations: wound vac x1 week, staples out x2 weeks  Elevated AST/ALT Asymptomatic. Recent ultrasound significant for gallbladder polyp. RUQ ultrasound on 10/3 significant for sludge and shadow. Continues to trend down.  Acute chest pain NSTEMI S/p cath without critical stenosis. Cardiology recommended okay for surgery. Nitroglycerin and heparin drip discontinued. Plavix and Aspirin  ESRD on PD PD per nephrology  History of CVA Continued aspirin and Plavix. Holding atorvastatin with elevated transaminases  PVD S/p left peroneal artery revascularization. Continued aspirin and Plavix  Anemia of CKD Stable  Hypotension Improved with midodrine  CAD Hx of CABG ICD Continued carvedilol, aspirin and Plavix. Isosorbide mononitrate discontinued by cardiology. Atorvastatin on hold secondary to elevated transaminases. Restart when improved.  Hypokalemia Hypomagensemia Supplementation given  COPD No exacerbation. Stable.  Chronic systolic CHF No exacerbation. ICD in place. Last EF of 45%  Hyponatremia Chronic. Stable.  Right toe ulcer Chronic. Patient has had successful revascularization of the right limb. No evidence of erythema or purulent drainage to suggest infection. Defer to wound care/orthopedic surgery with regard to wound care/possible debridement as an outpatient. Current recommendations are to monitor.  Constipation Likely opiate induced. Passing gas. No abdominal pain. Had multiple bowel movements yesterday. Continue Miralax/Senna/Colace and titrate for bowel movements.  Difficulty opening jaw Occurred after surgery. Limiting oral intake. Unknown etiology. Does not appear to have evidence of TMJ. No pain. Would not try NSAIDs in this patient, unfortunately. Hopefully will resolve spontaneously.  Discharge Diagnoses:  Principal Problem:  Left foot infection Active Problems:   ESRD on peritoneal dialysis (HCC)   CAD (coronary artery disease) of artery bypass graft   Chronic obstructive pulmonary disease (HCC)   Chronic congestive heart failure (HCC)   Chronic hypotension   ICD (implantable cardioverter-defibrillator) in place   IDDM (insulin dependent diabetes mellitus) (HCC)   Foot infection   Hypokalemia   Hypomagnesemia   Elevated AST (SGOT)   Chest pain   NSTEMI (non-ST elevated myocardial infarction) [facility])    Discharge Instructions  Discharge Instructions    Increase activity slowly    Complete by:  As directed      Allergies as of 01/17/2017      Reactions   Lisinopril Hives   Methotrexate Derivatives Other (See Comments)   blisters   Sulfa Antibiotics Hives   Tape Hives   Vancomycin Other (See Comments)   Blisters   Zanaflex [tizanidine Hcl]    Hypotensive stroke      Medication List    STOP taking these medications   atorvastatin 80 MG tablet Commonly known as:  LIPITOR   insulin regular 100 units/mL injection Commonly known as:  NOVOLIN R,HUMULIN R   isosorbide mononitrate 30 MG 24 hr tablet Commonly known as:  IMDUR     TAKE these medications   aspirin EC 81 MG tablet Take 81 mg by mouth daily.   calcium acetate 667 MG capsule Commonly known as:  PHOSLO Take 667 mg by mouth 3 (three) times daily with meals.   carvedilol 12.5 MG tablet Commonly known as:  COREG Take 12.5 mg by mouth 2 (two) times daily with a meal.   clopidogrel 75 MG tablet Commonly known as:  PLAVIX Take 75 mg by mouth daily.   docusate sodium 100 MG capsule Commonly known as:  COLACE Take 100 mg by mouth 2 (two) times daily.   feeding supplement (NEPRO CARB STEADY) Liqd Take 237 mLs by mouth 2 (two) times daily between meals.   feeding supplement (PRO-STAT SUGAR FREE 64) Liqd Take 30 mLs by mouth 2 (two) times daily.   insulin NPH Human 100 UNIT/ML injection Commonly known as:  HUMULIN N,NOVOLIN  N Inject 0.5 mLs (50 Units total) into the skin at bedtime. What changed:  You were already taking a medication with the same name, and this prescription was added. Make sure you understand how and when to take each.   insulin NPH Human 100 UNIT/ML injection Commonly known as:  HUMULIN N,NOVOLIN N Inject 0.4 mLs (40 Units total) into the skin daily before breakfast. What changed:  how much to take  when to take this   midodrine 5 MG tablet Commonly known as:  PROAMATINE Take 1 tablet (5 mg total) by mouth 2 (two) times daily with a meal.   multivitamin Tabs tablet Take 1 tablet by mouth at bedtime.   nitroGLYCERIN 0.4 MG SL tablet Commonly known as:  NITROSTAT Place 0.4 mg under the tongue every 5 (five) minutes as needed for chest pain.   ondansetron 4 MG tablet Commonly known as:  ZOFRAN Take 4 mg by mouth every 6 (six) hours as needed for nausea or vomiting.   oxyCODONE-acetaminophen 5-325 MG tablet Commonly known as:  PERCOCET/ROXICET Take 1 tablet by mouth every 6 (six) hours as needed for severe pain.   polyethylene glycol packet Commonly known as:  MIRALAX / GLYCOLAX Take 17 g by mouth daily.   senna 8.6 MG Tabs tablet Commonly known as:  SENOKOT Take 1 tablet (8.6 mg  total) by mouth daily.   Vitamin D (Ergocalciferol) 50000 units Caps capsule Commonly known as:  DRISDOL Take 50,000 Units by mouth every 30 (thirty) days.      Follow-up Information    Nadara Mustard, MD Follow up in 1 week(s).   Specialty:  Orthopedic Surgery Contact information: [address] [place] [address] [phone]          Allergies  Allergen Reactions  . Lisinopril Hives  . Methotrexate Derivatives Other (See Comments)    blisters  . Sulfa Antibiotics Hives  . Tape Hives  . Vancomycin Other (See Comments)    Blisters   . Zanaflex [Tizanidine Hcl]     Hypotensive stroke    Consultations:  Orthopedic  surgery  Nephrology  PM&R   Procedures/Studies: Dg Chest 2 View  Result Date: 01/12/2017 CLINICAL DATA:  CHF EXAM: CHEST  2 VIEW COMPARISON:  01/03/2017 chest radiograph. FINDINGS: Intact sternotomy wires. Subcutaneous single lead left chest ICD is stable with lead tip overlying the medial left upper chest. Stable cardiomediastinal silhouette with mild cardiomegaly and aortic atherosclerosis. No pneumothorax. Trace bilateral pleural effusions. Cephalization of the pulmonary vasculature, with no overt pulmonary edema. Mild left basilar atelectasis. IMPRESSION: 1. Stable cardiomegaly without overt pulmonary edema. 2. Trace bilateral pleural effusions and mild left basilar atelectasis. Electronically Signed   By: Delbert Phenix M.D.   On: 01/12/2017 10:12   Ct Head Wo Contrast  Result Date: 01/02/2017 CLINICAL DATA:  Ataxia, difficulty standing, hallucinations, altered mental status. EXAM: CT HEAD WITHOUT CONTRAST TECHNIQUE: Contiguous axial images were obtained from the base of the skull through the vertex without intravenous contrast. COMPARISON:  None. FINDINGS: Brain: No evidence of an acute infarct, acute hemorrhage, mass lesion, mass effect or hydrocephalus. Atrophy and periventricular low attenuation. Vascular: No hyperdense vessel or unexpected calcification. Skull: Normal. Negative for fracture or focal lesion. Sinuses/Orbits: No acute finding. Other: None. IMPRESSION: 1. No acute intracranial abnormality. 2. Atrophy and chronic microvascular white matter ischemic changes. Electronically Signed   By: Leanna Battles M.D.   On: 01/02/2017 16:34   US Abdomen Limited  Result Date: 12/19/2016 CLINICAL DATA:  3-4 days of nausea with fever.  History of diabetes. EXAM: ULTRASOUND ABDOMEN LIMITED RIGHT UPPER QUADRANT COMPARISON:  None in PACs FINDINGS: Gallbladder: The gallbladder is adequately distended. There is an echogenic non mobile nonshadowing focus near the gallbladder neck compatible with a  polyp measuring up 6 mm in diameter. There is no gallbladder wall thickening, pericholecystic fluid, or positive sonographic Murphy's sign. Common bile duct: Diameter: 4.5 mm. Liver: No focal lesion identified. Within normal limits in parenchymal echogenicity. Portal vein is patent on color Doppler imaging with normal direction of blood flow towards the liver. IMPRESSION: Probable 6 mm gallbladder polyp near the neck. No sonographic evidence of acute cholecystitis. Electronically Signed   By: David  [place] M.D.   On: 12/19/2016 12:27   Dg Chest Port 1 View  Result Date: 01/03/2017 CLINICAL DATA:  Fever this evening EXAM: PORTABLE CHEST 1 VIEW COMPARISON:  09/21/2015 FINDINGS: Status post CABG with borderline cardiomegaly and aortic atherosclerosis. Epicardial pacing leads are again seen overlying the right and left heart with new defibrillator like device partially imaged along the periphery of the left hemithorax with lead projecting up to the level of the aortic arch. Lungs are clear. No acute nor suspicious osseous abnormalities. IMPRESSION: Status post CABG with aortic atherosclerosis. No acute pneumonic consolidation. Electronically Signed   By: Rene Kocher.D.  On: 01/03/2017 23:21   Dg Foot Complete Left  Result Date: 01/04/2017 CLINICAL DATA:  Pain.  Status post midfoot amputation EXAM: LEFT FOOT - COMPLETE 3+ VIEW COMPARISON:  November 25, 2016 FINDINGS: Frontal, oblique, and lateral views were obtained. There has been amputation at the levels of the proximal metatarsals. There is soft tissue air in the stump region, presumably of postoperative etiology. There is no well-defined soft tissue abscess by radiography. Remaining bony structures appear intact without fracture or dislocation. No erosive change or bony destruction. There is extensive arterial vascular calcification. 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 No bony abnormality noted currently. Soft tissue  air noted in the stump region, likely of postoperative etiology. A degree of underlying infection in this area cannot be excluded radiographically. Multiple foci of arterial vascular calcification noted. Electronically Signed   By: Bretta Bang III M.D.   On: 01/04/2017 10:20   US Abdomen Limited Ruq  Result Date: 01/11/2017 CLINICAL DATA:  Elevated LFTs. EXAM: ULTRASOUND ABDOMEN LIMITED RIGHT UPPER QUADRANT COMPARISON:  12/19/2016 FINDINGS: Gallbladder: Evidence of sludge versus nonshadowing stones over the dependent portion of the gallbladder/neck. Largest measures 7 mm. No wall thickening or adjacent free fluid. Negative sonographic Murphy sign. Common bile duct: Diameter: 4.2 mm. Liver: No focal lesion identified. Within normal limits in parenchymal echogenicity. Portal vein is patent on color Doppler imaging with normal direction of blood flow towards the liver. IMPRESSION: Findings suggesting mild sludge versus small nonshadowing stones over the dependent portion of the gallbladder. No additional sonographic evidence to suggest cholecystitis. Electronically Signed   By: Elberta Fortis M.D.   On: 01/11/2017 21:50    Heart catheterization (01/09/17)   Ost RCA to Prox RCA lesion, 0 %stenosed.  LM lesion, 100 %stenosed.  LIMA.  SVG.  Origin lesion, 100 %stenosed.  SVG.  Dist Graft to Insertion lesion, 40 %stenosed.  1st Mrg lesion, 75 %stenosed.  Ost Cx to Prox Cx lesion, 80 %stenosed.  Ost 3rd Mrg lesion, 100 %stenosed.  Ost 1st Mrg lesion, 100 %stenosed.  Mid Cx lesion, 60 %stenosed.    Subjective: Improved left leg pain  Discharge Exam: Vitals:   01/17/17 0438 01/17/17 0810  BP: (!) 157/58 (!) 124/50  Pulse: 98 91  Resp: 16 20  Temp: (!) 97.1 F (36.2 C) 98.7 F (37.1 C)  SpO2: 99%    Vitals:   01/16/17 1452 01/16/17 2030 01/17/17 0438 01/17/17 0810  BP: 103/65 (!) 138/58 (!) 157/58 (!) 124/50  Pulse: 92 100 98 91  Resp:  Temp:  97.8 F (36.6  C) (!) 97.1 F (36.2 C) 98.7 F (37.1 C)  TempSrc:  Oral Axillary Oral  SpO2:  99% 99%   Weight:    86.7 kg (191 lb 2.2 oz)  Height:        General exam: Appears calm and comfortable Respiratory system: Clear to auscultation bilaterally. Unlabored work of breathing. No wheezing or rales. Cardiovascular system: S1 & S2 heard, RRR. No murmurs. Right DP palpable. Gastrointestinal system: Abdomen is distended, soft and nontender. Normal bowel sounds heard. Central nervous system: Alert and oriented. No focal neurological deficits. Extremities: No edema. No calf tenderness. Left BKA. Skin: No cyanosis. Right 5th digit ulcer with eschar.  Psychiatry: Judgement and insight appear normal. Mood & affect appropriate.   The results of significant diagnostics from this hospitalization (including imaging, microbiology, ancillary and laboratory) are listed below for reference.     Microbiology: Recent Results (  from the past 240 hour(s))  MRSA PCR Screening     Status: None   Collection Time: 01/08/17  4:15 PM  Result Value Ref Range Status   MRSA by PCR NEGATIVE NEGATIVE Final    Comment:        The GeneXpert MRSA Assay (FDA approved for NASAL specimens only), is one component of a comprehensive MRSA colonization surveillance program. It is not intended to diagnose MRSA infection nor to guide or monitor treatment for MRSA infections.   Surgical pcr screen     Status: None   Collection Time: 01/11/17  4:00 AM  Result Value Ref Range Status   MRSA, PCR NEGATIVE NEGATIVE Final   Staphylococcus aureus NEGATIVE NEGATIVE Final    Comment: (NOTE) The Xpert SA Assay (FDA approved for NASAL specimens in patients 29 years of age and older), is one component of a comprehensive surveillance program. It is not intended to diagnose infection nor to guide or monitor treatment.      Labs: BNP (last 3 results) No results for input(s): BNP in the last 8760 hours. Basic Metabolic  Panel:  Recent Labs Lab 01/11/17 0216 01/12/17 0916 01/13/17 0238 01/14/17 0416 01/15/17 0212 01/16/17 0444  NA 128* 130* 130* 130* 132* 133*  K 4.9 3.7 3.7 3.6 3.3* 3.7  CL 90* 92* 91* 92* 91* 92*  CO2 25 25 25 25 27 25   GLUCOSE 273* 335* 360* 328* 348* 145*  BUN 47* 46* 46* 49* 50* 61*  CREATININE 6.14* 7.00* 6.94* 7.26* 7.30* 8.20*  CALCIUM 8.1* 7.9* 7.9* 8.1* 8.5* 8.5*  MG 2.0  --   --   --   --   --   PHOS 5.0*  --  5.4*  --   --  4.6   Liver Function Tests:  Recent Labs Lab 01/11/17 0216 01/12/17 0916 01/13/17 0238 01/14/17 0416 01/15/17 0212 01/16/17 0444  AST 566* 437* 308* 185* 132*  --   ALT 105* 52 19 9* 7*  --   ALKPHOS 67 60 61 58 64  --   BILITOT 2.2* 0.3 0.5 0.5 0.4  --   PROT 5.6* 5.6* 5.4* 5.6* 5.8*  --   ALBUMIN 1.4* 1.3* 1.3* 1.3* 1.3* 1.4*   No results for input(s): LIPASE, AMYLASE in the last 168 hours. No results for input(s): AMMONIA in the last 168 hours. CBC:  Recent Labs Lab 01/11/17 0216 01/12/17 0916 01/13/17 0238 01/15/17 0802  WBC 9.9 10.5 8.1 11.2*  NEUTROABS 7.9*  --   --   --   HGB 9.1* 8.4* 7.9* 7.7*  HCT 28.2* 26.6* 25.1* 24.5*  MCV 94.9 96.0 95.4 96.5  PLT 468* 437* 404* 436*   Cardiac Enzymes: No results for input(s): CKTOTAL, CKMB, CKMBINDEX, TROPONINI in the last 168 hours. BNP: Invalid input(s): POCBNP CBG:  Recent Labs Lab 01/16/17 1134 01/16/17 1707 01/16/17 2125 01/17/17 0617 01/17/17 1146  GLUCAP 304* 272* 361* 387* 309*   D-Dimer No results for input(s): DDIMER in the last 72 hours. Hgb A1c No results for input(s): HGBA1C in the last 72 hours. Lipid Profile No results for input(s): CHOL, HDL, LDLCALC, TRIG, CHOLHDL, LDLDIRECT in the last 72 hours. Thyroid function studies No results for input(s): TSH, T4TOTAL, T3FREE, THYROIDAB in the last 72 hours.  Invalid input(s): FREET3 Anemia work up No results for input(s): VITAMINB12, FOLATE, FERRITIN, TIBC, IRON, RETICCTPCT in the last 72  hours. Urinalysis    Component Value Date/Time   COLORURINE YELLOW 01/02/2017 1214   APPEARANCEUR  CLEAR 01/02/2017 1214   LABSPEC 1.008 01/02/2017 1214   PHURINE 7.0 01/02/2017 1214   GLUCOSEU >=500 (A) 01/02/2017 1214   HGBUR NEGATIVE 01/02/2017 1214   BILIRUBINUR NEGATIVE 01/02/2017 1214   KETONESUR NEGATIVE 01/02/2017 1214   PROTEINUR 100 (A) 01/02/2017 1214   NITRITE NEGATIVE 01/02/2017 1214   LEUKOCYTESUR NEGATIVE 01/02/2017 1214   Sepsis Labs Invalid input(s): PROCALCITONIN,  WBC,  LACTICIDVEN Microbiology Recent Results (from the past 240 hour(s))  MRSA PCR Screening     Status: None   Collection Time: 01/08/17  4:15 PM  Result Value Ref Range Status   MRSA by PCR NEGATIVE NEGATIVE Final    Comment:        The GeneXpert MRSA Assay (FDA approved for NASAL specimens only), is one component of a comprehensive MRSA colonization surveillance program. It is not intended to diagnose MRSA infection nor to guide or monitor treatment for MRSA infections.   Surgical pcr screen     Status: None   Collection Time: 01/11/17  4:00 AM  Result Value Ref Range Status   MRSA, PCR NEGATIVE NEGATIVE Final   Staphylococcus aureus NEGATIVE NEGATIVE Final    Comment: (NOTE) The Xpert SA Assay (FDA approved for NASAL specimens in patients 31 years of age and older), is one component of a comprehensive surveillance program. It is not intended to diagnose infection nor to guide or monitor treatment.      Time coordinating discharge: Over 30 minutes  SIGNED:   Jacquelin Hawking, MD [facility] 01/17/2017, 3:57 PM Pager [phone]  If 7PM-7AM, please contact night-coverage [email or web address] Password [email or web address]

## 2017-01-18 ENCOUNTER — Inpatient Hospital Stay (HOSPITAL_COMMUNITY): Payer: Medicare Other | Admitting: Physical Therapy

## 2017-01-18 ENCOUNTER — Inpatient Hospital Stay (HOSPITAL_COMMUNITY): Payer: Medicare Other | Admitting: Occupational Therapy

## 2017-01-18 DIAGNOSIS — E669 Obesity, unspecified: Secondary | ICD-10-CM | POA: Insufficient documentation

## 2017-01-18 DIAGNOSIS — I739 Peripheral vascular disease, unspecified: Secondary | ICD-10-CM

## 2017-01-18 DIAGNOSIS — I9589 Other hypotension: Secondary | ICD-10-CM

## 2017-01-18 DIAGNOSIS — I959 Hypotension, unspecified: Secondary | ICD-10-CM

## 2017-01-18 DIAGNOSIS — E1169 Type 2 diabetes mellitus with other specified complication: Secondary | ICD-10-CM

## 2017-01-18 DIAGNOSIS — M79671 Pain in right foot: Secondary | ICD-10-CM

## 2017-01-18 DIAGNOSIS — R410 Disorientation, unspecified: Secondary | ICD-10-CM

## 2017-01-18 DIAGNOSIS — I998 Other disorder of circulatory system: Secondary | ICD-10-CM | POA: Insufficient documentation

## 2017-01-18 DIAGNOSIS — D72829 Elevated white blood cell count, unspecified: Secondary | ICD-10-CM

## 2017-01-18 DIAGNOSIS — I999 Unspecified disorder of circulatory system: Secondary | ICD-10-CM

## 2017-01-18 DIAGNOSIS — D638 Anemia in other chronic diseases classified elsewhere: Secondary | ICD-10-CM

## 2017-01-18 LAB — COMPREHENSIVE METABOLIC PANEL
ALT: 9 U/L — ABNORMAL LOW (ref 17–63)
AST: 43 U/L — ABNORMAL HIGH (ref 15–41)
Albumin: 1.2 g/dL — ABNORMAL LOW (ref 3.5–5.0)
Alkaline Phosphatase: 70 U/L (ref 38–126)
Anion gap: 12 (ref 5–15)
BUN: 53 mg/dL — ABNORMAL HIGH (ref 6–20)
CO2: 27 mmol/L (ref 22–32)
Calcium: 8.6 mg/dL — ABNORMAL LOW (ref 8.9–10.3)
Chloride: 90 mmol/L — ABNORMAL LOW (ref 101–111)
Creatinine, Ser: 7.61 mg/dL — ABNORMAL HIGH (ref 0.61–1.24)
GFR calc Af Amer: 7 mL/min — ABNORMAL LOW (ref >60)
GFR calc non Af Amer: 6 mL/min — ABNORMAL LOW (ref >60)
Glucose, Bld: 373 mg/dL — ABNORMAL HIGH (ref 65–99)
Potassium: 3.3 mmol/L — ABNORMAL LOW (ref 3.5–5.1)
Sodium: 129 mmol/L — ABNORMAL LOW (ref 135–145)
Total Bilirubin: 0.4 mg/dL (ref 0.3–1.2)
Total Protein: 5.4 g/dL — ABNORMAL LOW (ref 6.5–8.1)

## 2017-01-18 LAB — CBC WITH DIFFERENTIAL/PLATELET
BASOS ABS: 0 10*3/uL (ref 0.0–0.1)
Basophils Relative: 0 %
Eosinophils Absolute: 0.2 10*3/uL (ref 0.0–0.7)
Eosinophils Relative: 2 %
HEMATOCRIT: 25.4 % — AB (ref 39.0–52.0)
Hemoglobin: 7.8 g/dL — ABNORMAL LOW (ref 13.0–17.0)
LYMPHS ABS: 1.3 10*3/uL (ref 0.7–4.0)
Lymphocytes Relative: 13 %
MCH: 29.9 pg (ref 26.0–34.0)
MCHC: 30.7 g/dL (ref 30.0–36.0)
MCV: 97.3 fL (ref 78.0–100.0)
MONO ABS: 1 10*3/uL (ref 0.1–1.0)
MONOS PCT: 10 %
NEUTROS ABS: 7.6 10*3/uL (ref 1.7–7.7)
Neutrophils Relative %: 75 %
PLATELETS: 462 10*3/uL — AB (ref 150–400)
RBC: 2.61 MIL/uL — AB (ref 4.22–5.81)
RDW: 16.2 % — AB (ref 11.5–15.5)
WBC: 10.1 10*3/uL (ref 4.0–10.5)

## 2017-01-18 LAB — GLUCOSE, CAPILLARY
GLUCOSE-CAPILLARY: 217 mg/dL — AB (ref 65–99)
Glucose-Capillary: 385 mg/dL — ABNORMAL HIGH (ref 65–99)
Glucose-Capillary: 198 mg/dL — ABNORMAL HIGH (ref 65–99)
Glucose-Capillary: 64 mg/dL — ABNORMAL LOW (ref 65–99)

## 2017-01-18 MED ORDER — OXYCODONE HCL 5 MG PO TABS
2.5000 mg | ORAL_TABLET | Freq: Four times a day (QID) | ORAL | Status: DC | PRN
  Administered 2017-01-19 – 2017-02-06 (×28): 2.5 mg via ORAL
  Filled 2017-01-18 (×31): qty 1

## 2017-01-18 MED ORDER — GENTAMICIN SULFATE 0.1 % EX CREA
1.0000 "application " | TOPICAL_CREAM | Freq: Two times a day (BID) | CUTANEOUS | Status: DC
  Administered 2017-01-18 – 2017-02-06 (×31): 1 via TOPICAL
  Filled 2017-01-18 (×2): qty 15

## 2017-01-18 NOTE — Progress Notes (Signed)
Joshua Fennel, MD Physician Signed Physical Medicine and Rehabilitation  Consult Note Date of Service: 12/26/2016 9:28 AM  Related encounter: Admission (Discharged) from 12/15/2016 in [facility] 4E CV SURGICAL PROGRESSIVE CARE     Expand All Collapse All   Hide copied text Hover for attribution information      Physical Medicine and Rehabilitation Consult   Reason for Consult:  L-transmetatarsal amputation with debility due to multiple recent medical issues.  Referring Physician: Dr. Randie Heinz.    HPI: [name] is a 76 y.o. male with history of COPD, T2DM, CAD s/p ICD, ESRD- on PD,  PAD with critical limb ischemia with non-healing LLE wound who was admitted for right femoral pseudoaneurysm repair and resection of Left 3rd MT head on 12/15/16 by Dr. Randie Heinz. He failed attempts at limb salvage and hospital course complicated by N/V, leucocytosis and fever likely due to sepsis. GB ultrasound revealed GB polyp without acute cholecystitis. He has had issues with right groin wound dehiscence of wound with gangrenous changes left foot. He  required left transmetatarsal amputation with VAC placement by Dr. Lajoyce Corners on 9/14. Post op NWB LLE and antibiotics discontinued.   Dr. Pollyann Kennedy consulted for input on sore throat, difficulty swallowing and oral pharyngeal ulcerations. He recommended supportive care for viral v/s antibiotics related ulcers. Right groin with small hematoma and decrease in erythema per reports. CIR recommended due to deficits in mobility and decreased ability to carry out ADL tasks.    Review of Systems  Constitutional: Negative for chills and fever.  HENT: Negative for hearing loss and tinnitus.   Eyes: Negative for blurred vision and double vision.  Respiratory: Negative for cough, sputum production and shortness of breath.   Cardiovascular: Negative for chest pain and palpitations.  Gastrointestinal: Positive for constipation (no BM since last thurs). Negative for  abdominal pain, heartburn and nausea.  Musculoskeletal: Positive for back pain, joint pain and myalgias. Negative for neck pain.  Skin: Negative for rash.  Neurological: Positive for sensory change (BLE). Negative for dizziness and headaches.  Psychiatric/Behavioral: The patient does not have insomnia.   All other systems reviewed and are negative.         Past Medical History:  Diagnosis Date  . AICD (automatic cardioverter/defibrillator) present   . CHF (congestive heart failure) (HCC)   . COPD (chronic obstructive pulmonary disease) (HCC)   . Coronary artery disease    CABG 2011  . Diabetes mellitus without complication (HCC)   . ESRD on peritoneal dialysis [facility])    Started PD Dec 2016 in [place]. Nephrology is in [name].   [name] History of peritonitis    PD cath related peritonitis in April 2017  . Hypertension   . Peripheral vascular disease (HCC)   . Sjogren's syndrome (HCC)    Per pt's wife, was diagnosed in 2016 during w/u for cause of renal failure prior to starting dialysis.  He had a rash on his chest apparently prompting this w/u.  Never had renal bx.  He was referred to a rheum MD in [place] and per the wife he was treated with MTX and other medications but had side effects to "all of it" and isn't taking anything for this as of Jun 2017.   . Stroke [facility])          Past Surgical History:  Procedure Laterality Date  . ABDOMINAL AORTOGRAM W/LOWER EXTREMITY N/A 11/29/2016   Procedure: ABDOMINAL AORTOGRAM W/LOWER EXTREMITY;  Surgeon: Nada Libman, MD;  Location: [facility] INVASIVE  CV LAB;  Service: Cardiovascular;  Laterality: N/A;  . AMPUTATION Left 11/29/2016   Procedure: LEFT THIRD TOE AMPUTATION;  Surgeon: Nada Libman, MD;  Location: [facility] OR;  Service: Vascular;  Laterality: Left;  . AMPUTATION Left 12/23/2016   Procedure: Left Transmetatarsal Amputation;  Surgeon: Nadara Mustard, MD;  Location: [facility] OR;  Service: Orthopedics;  Laterality: Left;    . ANGIOPLASTY  12/15/2016   Procedure: BALLOON ANGIOPLASTY;  Surgeon: Maeola Harman, MD;  Location: [facility] OR;  Service: Vascular;;  . cardiac catherization with stent placement  2017  . CORONARY ARTERY BYPASS GRAFT  2011  . FALSE ANEURYSM REPAIR Right 12/15/2016   Procedure: REPAIR FALSE ANEURYSM;  Surgeon: Maeola Harman, MD;  Location: [facility] OR;  Service: Vascular;  Laterality: Right;  . IRRIGATION AND DEBRIDEMENT FOOT Left 12/15/2016   Procedure: IRRIGATION AND DEBRIDEMENT FOOT;  Surgeon: Maeola Harman, MD;  Location: [facility] OR;  Service: Vascular;  Laterality: Left;  . LOWER EXTREMITY ANGIOGRAM Left 12/15/2016   Procedure: LOWER EXTREMITY ANGIOGRAM; PEDAL ACCESS;  Surgeon: Maeola Harman, MD;  Location: [facility] OR;  Service: Vascular;  Laterality: Left;  . PERIPHERAL VASCULAR BALLOON ANGIOPLASTY  11/29/2016   Procedure: PERIPHERAL VASCULAR BALLOON ANGIOPLASTY;  Surgeon: Nada Libman, MD;  Location: [facility] INVASIVE CV LAB;  Service: Cardiovascular;;  LT Peroneal AT attempted unsuccessful         Family History  Problem Relation Age of Onset  . Heart disease Father     Social History:  Married. Retired Curator. Wife supportive and can provide assistance after discharge. He  reports that he has quit smoking 25 years ago. He has never used smokeless tobacco. He reports that he does not drink alcohol or use drugs.         Allergies  Allergen Reactions  . Lisinopril Hives  . Methotrexate Derivatives Other (See Comments)    blisters  . Sulfa Antibiotics Hives  . Tape Hives  . Vancomycin Other (See Comments)    Blisters           Medications Prior to Admission  Medication Sig Dispense Refill  . amoxicillin (AMOXIL) 500 MG capsule Take 2,000 mg by mouth See admin instructions. 1 hour prior to dental procedures    . aspirin EC 81 MG tablet Take 81 mg by mouth daily.    [name] atorvastatin (LIPITOR) 80 MG tablet Take 80 mg by mouth  daily at 6 PM.    . calcium acetate (PHOSLO) 667 MG capsule Take 667 mg by mouth 3 (three) times daily with meals.    . carvedilol (COREG) 12.5 MG tablet Take 12.5 mg by mouth 2 (two) times daily with a meal.    . clopidogrel (PLAVIX) 75 MG tablet Take 75 mg by mouth daily.    [name] docusate sodium (COLACE) 100 MG capsule Take 100 mg by mouth 2 (two) times daily.     . furosemide (LASIX) 40 MG tablet Take 40 mg by mouth 2 (two) times daily.    . insulin NPH Human (HUMULIN N,NOVOLIN N) 100 UNIT/ML injection Inject 70 Units into the skin at bedtime.    . insulin regular (NOVOLIN R,HUMULIN R) 100 units/mL injection Inject 20 Units into the skin 3 (three) times daily before meals.    . isosorbide mononitrate (IMDUR) 30 MG 24 hr tablet Take 30 mg by mouth daily.    [name] losartan (COZAAR) 25 MG tablet Take 25 mg by mouth at bedtime.     . nitroGLYCERIN (NITROSTAT)  0.4 MG SL tablet Place 0.4 mg under the tongue every 5 (five) minutes as needed for chest pain.    [name] ondansetron (ZOFRAN) 4 MG tablet Take 4 mg by mouth every 6 (six) hours as needed for nausea or vomiting.    [name] oxyCODONE-acetaminophen (PERCOCET/ROXICET) 5-325 MG tablet Take 1 tablet by mouth every 6 (six) hours as needed for severe pain. 20 tablet 0  . Vitamin D, Ergocalciferol, (DRISDOL) 50000 units CAPS capsule Take 50,000 Units by mouth every 30 (thirty) days.       Home: Home Living Family/patient expects to be discharged to:: Private residence Living Arrangements: Spouse/significant other Available Help at Discharge: Family, Available 24 hours/day Type of Home: Mobile home Home Access: Stairs to enter [employer] of Steps: 1 Entrance Stairs-Rails: Left Home Layout: One level Bathroom Shower/Tub: Psychologist, counselling, Sport and exercise psychologist: Standard Home Equipment: Environmental consultant - 4 wheels, Medical laboratory scientific officer - quad, Wheelchair - manual  Functional History: Prior Function Level of Independence: Needs assistance Gait /  Transfers Assistance Needed: uses [name], RW ADL's / Homemaking Assistance Needed: A for socks, can bath self in shower Functional Status:  Mobility: Bed Mobility Overal bed mobility: Needs Assistance Bed Mobility: Supine to Sit Supine to sit: Min assist, HOB elevated Sit to supine: Min assist General bed mobility comments: assist to elevate trunk into sitting. Use of rail Transfers Overall transfer level: Needs assistance Equipment used: None Transfers: Lateral/Scoot Transfers Sit to Stand: +2 safety/equipment, Min assist Stand pivot transfers: Min assist, From elevated surface  Lateral/Scoot Transfers: Min assist General transfer comment: going to his right with minimal use of bed pad Ambulation/Gait Ambulation/Gait assistance: +2 safety/equipment, Min assist Ambulation Distance (Feet): 40 Feet Assistive device: Rolling walker (2 wheeled) Gait Pattern/deviations: Step-to pattern, Decreased stance time - left, Decreased step length - right, Decreased weight shift to left, Trunk flexed General Gait Details: cues for posture and proximity of RW to aid in limiting weight bearing on L LE; pt with less difficulty maintaining weight mostly on L heel with tennis shoe donned on R Gait velocity: decreased  ADL: ADL Overall ADL's : Needs assistance/impaired Eating/Feeding: Sitting, Independent Eating/Feeding Details (indicate cue type and reason): Pt can independently feed himself but was having his wife feed him on OT arrival.  Grooming: Set up, Sitting Grooming Details (indicate cue type and reason): Becomes winded when completing seated ADL tasks.  Upper Body Bathing: Supervision/ safety, Sitting Upper Body Bathing Details (indicate cue type and reason): Fatigues easily Lower Body Bathing: Maximal assistance Lower Body Bathing Details (indicate cue type and reason): min A +2 sit<>stand Upper Body Dressing : Set up, Sitting Lower Body Dressing: Maximal assistance, Sit to/from  stand Lower Body Dressing Details (indicate cue type and reason): Able to assist therapist to doff Darco shoe Toilet Transfer: Minimal assistance Toilet Transfer Details (indicate cue type and reason): lateral scoot from bed (slightly raised) to drop arm recliner Toileting- Clothing Manipulation and Hygiene: Moderate assistance Toileting - Clothing Manipulation Details (indicate cue type and reason): Min A +2 sit<>stand General ADL Comments: session focused on HEP for BUE in preparation for increased dependence on UE for support during mobility  Cognition: Cognition Overall Cognitive Status: Within Functional Limits for tasks assessed Orientation Level: Oriented X4 Cognition Arousal/Alertness: Awake/alert Behavior During Therapy: WFL for tasks assessed/performed Overall Cognitive Status: Within Functional Limits for tasks assessed  Blood pressure (!) 104/56, pulse 92, temperature (!) 97.4 F (36.3 C), temperature source Oral, resp. rate 20, height  (1.753 m), weight 94.1 kg (  207 lb 7.3 oz), SpO2 96 %. Physical Exam  Nursing note and vitals reviewed. Constitutional: He is oriented to person, place, and time. He appears well-developed and well-nourished.  HENT:  Head: Normocephalic and atraumatic.  Mouth/Throat: Oropharynx is clear and moist.  Eyes: Pupils are equal, round, and reactive to light. Conjunctivae and EOM are normal. Scleral icterus is present.  Neck: Normal range of motion. Neck supple.  Cardiovascular: Normal rate and regular rhythm.   Murmur heard. Respiratory: Effort normal and breath sounds normal. No stridor. No respiratory distress. He has no wheezes.  GI: Soft. Bowel sounds are normal. He exhibits no distension. There is no tenderness.  Musculoskeletal: He exhibits edema and tenderness.  Min edema BLE.  Right groin with dry dressing. Left foot transmetatarsal site with wound VAC in place.   Neurological: He is alert and oriented to person, place, and time.   Speech clear.  Flat affect and answers questions without difficulty.   Motor: 4+/5 throughout, except left ankle (pain inhibition) Sensation diminished to light touch left ankle  Skin: Skin is warm and dry.  (See H&P)  Psychiatric: He has a normal mood and affect. His behavior is normal. Judgment and thought content normal.       Assessment/Plan: Diagnosis: Left transmetatarsal amputation Labs independently reviewed.  Records reviewed and summated above.  1. Does the need for close, 24 hr/day medical supervision in concert with the patient's rehab needs make it unreasonable for this patient to be served in a less intensive setting? Yes  2. Co-Morbidities requiring supervision/potential complications:  COPD (monitor O2 Sats and RR with increased activity), T2 DM with uncontrolled CBGs >400(Monitor in accordance with exercise and adjust meds as necessary), CAD s/p ICD (cone meds), ESRD- on PD (recs per Neprho),  PAD (cont meds), N/V (meds), leukocytosis (cont to monitor for signs and symptoms of infection, further workup if indicated), fever (cont to monitor), tachypnea (monitor RR and O2 Sats with increased physical exertion) 3. Due to bowel management, safety, skin/wound care, disease management, pain management and patient education, does the patient require 24 hr/day rehab nursing? Yes 4. Does the patient require coordinated care of a physician, rehab nurse, PT (1-2 hrs/day, 5 days/week) and OT (1-2 hrs/day, 5 days/week) to address physical and functional deficits in the context of the above medical diagnosis(es)? Yes Addressing deficits in the following areas: balance, endurance, locomotion, strength, transferring, bathing, dressing, feeding, grooming and psychosocial support 5. Can the patient actively participate in an intensive therapy program of at least 3 hrs of therapy per day at least 5 days per week? Yes 6. The potential for patient to make measurable gains while on inpatient rehab  is excellent 7. Anticipated functional outcomes upon discharge from inpatient rehab are modified independent and supervision  with PT, modified independent and supervision with OT, n/a with SLP. 8. Estimated rehab length of stay to reach the above functional goals is: 14-17 days. 9. Anticipated D/C setting: Home 10. Anticipated post D/C treatments: HH therapy and Home excercise program 11. Overall Rehab/Functional Prognosis: excellent  RECOMMENDATIONS: This patient's condition is appropriate for continued rehabilitative care in the following setting: CIR Patient has agreed to participate in recommended program. Yes Note that insurance prior authorization may be required for reimbursement for recommended care.  Comment: Rehab Admissions Coordinator to follow up.  Maryla Morrow, MD, ABPMR Jacquelynn Cree, [place] 12/26/2016    Revision History  Routing History

## 2017-01-18 NOTE — Evaluation (Signed)
Occupational Therapy Assessment and Plan  Patient Details  Name: [name] MRN: [id number] Date of Birth: 23-Mar-1941  OT Diagnosis: abnormal posture, acute pain and muscle weakness (generalized) Rehab Potential: Rehab Potential (ACUTE ONLY): Good ELOS: 14-18 days   Today's Date: 01/18/2017 OT Individual Time: 7782-4235 OT Individual Time Calculation (min): 62 min     Problem List:  Patient Active Problem List   Diagnosis Date Noted  . Ischemic pain of right foot   . Arterial hypotension   . Diabetes mellitus type 2 in obese ([place])   . PVD (peripheral vascular disease) ([place])   . Delirium   . Unilateral complete BKA, right, sequela ([place]) 01/17/2017  . Acute blood loss anemia   . Elevated AST (SGOT) 01/09/2017  . Chest pain 01/09/2017  . NSTEMI (non-ST elevated myocardial infarction) ([place]) 01/09/2017  . Hypokalemia 01/07/2017  . Hypomagnesemia 01/07/2017  . Left foot infection 01/06/2017  . Chronic hypotension 01/06/2017  . 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 IDDM (insulin dependent diabetes mellitus) ([place]) 01/06/2017  . Foot infection 01/06/2017  . Labile blood pressure   . Hallucination   . Benign essential HTN   . Ulcer of toe of right foot ([place])   . Ulcer of external ear, limited to breakdown of skin ([place])   . Chronic congestive heart failure ([place])   . Anemia of chronic disease   . Labile blood glucose   . Uncontrolled diabetes mellitus type 2 with peripheral artery disease ([place])   . Diabetic peripheral neuropathy ([place])   . Debility 12/27/2016  . History of transmetatarsal amputation of left foot ([place]) 12/27/2016  . Constipation   . Chronic obstructive pulmonary disease ([place])   . Nausea   . ESRD on dialysis ([place])   . Leukocytosis   . Tachypnea   . FUO (fever of unknown origin)   . Encephalopathy 12/23/2016  . Sore throat 12/23/2016  . Gangrene of left foot ([place]) 11/26/2016  . PAD (peripheral artery disease) ([place]) 11/26/2016   . Sjogren's syndrome ([place]) 09/26/2015  . Uncontrolled type 2 diabetes mellitus with hyperglycemia, with long-term current use of insulin ([place]) 09/26/2015  . Hyperosmolar non-ketotic state in patient with type 2 diabetes mellitus ([place]) 09/25/2015  . Ileus ([place]) 09/22/2015  . Acute respiratory failure with hypoxia ([place]) 09/22/2015  . Acute gouty arthritis 09/22/2015  . CAD (coronary artery disease) of artery bypass graft 09/22/2015  . Sepsis ([place]) 09/21/2015  . Elevated troponin level 09/21/2015  . ESRD on peritoneal dialysis ([place]) 09/21/2015  . CVA (cerebral infarction) 09/21/2015    Past Medical History:  Past Medical History:  Diagnosis Date  . AICD (automatic cardioverter/defibrillator) present   . CHF (congestive heart failure) ([place])   . COPD (chronic obstructive pulmonary disease) ([place])   . Coronary artery disease    CABG 2011  . Diabetes mellitus without complication ([place])   . ESRD on peritoneal dialysis [facility])    Started PD Dec 2016 in [place]. Nephrology is in [place].   [name] History of peritonitis    PD cath related peritonitis in April 2017  . Hypertension   . Peripheral vascular disease ([place])   . Sjogren's syndrome ([place])    Per pt's wife, was diagnosed in 2016 during w/u for cause of renal failure prior to starting dialysis.  He had a rash on his chest apparently prompting this w/u.  Never had renal bx.  He was referred to a rheum MD in [place] and per the wife he was treated  with MTX and other medications but had side effects to "all of it" and isn't taking anything for this as of Jun 2017.   . Stroke [facility])    Past Surgical History:  Past Surgical History:  Procedure Laterality Date  . ABDOMINAL AORTOGRAM W/LOWER EXTREMITY N/A 11/29/2016   Procedure: ABDOMINAL AORTOGRAM W/LOWER EXTREMITY;  Surgeon: Serafina Mitchell, MD;  Location: [facility];  Service: Cardiovascular;  Laterality: N/A;  . AMPUTATION Left 11/29/2016   Procedure: LEFT THIRD TOE AMPUTATION;   Surgeon: Serafina Mitchell, MD;  Location: [facility] OR;  Service: Vascular;  Laterality: Left;  . AMPUTATION Left 12/23/2016   Procedure: Left Transmetatarsal Amputation;  Surgeon: Newt Minion, MD;  Location: [place];  Service: Orthopedics;  Laterality: Left;  . AMPUTATION Left 01/11/2017   Procedure: LEFT BELOW KNEE AMPUTATION;  Surgeon: Newt Minion, MD;  Location: [place];  Service: Orthopedics;  Laterality: Left;  . ANGIOPLASTY  12/15/2016   Procedure: BALLOON ANGIOPLASTY;  Surgeon: Waynetta Sandy, MD;  Location: [place];  Service: Vascular;;  . cardiac catherization with stent placement  2017  . CORONARY ARTERY BYPASS GRAFT  2011  . FALSE ANEURYSM REPAIR Right 12/15/2016   Procedure: REPAIR FALSE ANEURYSM;  Surgeon: Waynetta Sandy, MD;  Location: [place];  Service: Vascular;  Laterality: Right;  . IRRIGATION AND DEBRIDEMENT FOOT Left 12/15/2016   Procedure: IRRIGATION AND DEBRIDEMENT FOOT;  Surgeon: Waynetta Sandy, MD;  Location: [place];  Service: Vascular;  Laterality: Left;  . LEFT HEART CATH AND CORS/GRAFTS ANGIOGRAPHY N/A 01/09/2017   Procedure: LEFT HEART CATH AND CORS/GRAFTS ANGIOGRAPHY;  Surgeon: Lorretta Harp, MD;  Location: [place] [facility];  Service: Cardiovascular;  Laterality: N/A;  . LOWER EXTREMITY ANGIOGRAM Left 12/15/2016   Procedure: LOWER EXTREMITY ANGIOGRAM; PEDAL ACCESS;  Surgeon: Waynetta Sandy, MD;  Location: [place];  Service: Vascular;  Laterality: Left;  . PERIPHERAL VASCULAR BALLOON ANGIOPLASTY  11/29/2016   Procedure: PERIPHERAL VASCULAR BALLOON ANGIOPLASTY;  Surgeon: Serafina Mitchell, MD;  Location: [place] [facility];  Service: Cardiovascular;;  LT Peroneal AT attempted unsuccessful    Assessment & Plan Clinical Impression: Patient is a 76 y.o. malewith history of COPD, T2DM, CAD s/p ICD, ESRD- on PD, PAD with critical limb ischemia with non-healing LLE wound who was admitted for right femoral pseudoaneurysm repair and resection of  Left 3rd MT head on 12/15/16 by Dr. Donzetta Matters. Hospital course complicated by N/V, leucocytosis and fever likely due to sepsis. GB ultrasound revealed GB polyp without acute cholecystitis. He has had difficulty swallowing due to oral pharyngeal ulcers and Dr. Constance Holster recommended supportive care for management. He has had issues with right groin wound dehiscence of wound with gangrenous changes left foot. 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 Sharol Given on 9/14 and right groin hematoma showed decrease in erythema.  He was admitted to CIR on 12/27/2016 due to deficits in mobility and decreased ability to carry out ADL tasks. His was noted to have hypotensive episodes with fatigue and frequently during the pm sessions. Nephrology has been following for management of PD and decreased amount of fluid removed with PD. Insulin has been titrated upwards slowly to prevent hypoglycemic episodes as blood sugars have been difficulty to control due to variable intake. He developed increase in pain left foot with ischemic changes and skin break down requiring surgical intervention. He was discharged to acute on 01/06/17 due to ongoing fevers, delirium with hallucinations, hypotension and concerns  of developing sepsis. He did develop jaw pain radiating to chest on 9/30 with SEMI and elevated troponin. He was started on IV heparin and Cardiac cath did not show critical stenosis--he was felt to be moderate risk for surgery. He underwent left transtibial amputation on 10/03 by Dr. Sharol Given and has defervesced. Therapy ongoing and patient with decreased balance, difficulty following simple command consistently as well as debility compounded by L-BKA. CIR recommended to resume rehab course.    Patient transferred to CIR on 01/17/2017 .    Patient currently requires total with basic self-care skills secondary to muscle weakness, decreased cardiorespiratoy endurance and decreased standing balance, decreased  postural control and decreased balance strategies.  Prior to hospitalization, patient could complete ADLs with modified independent .  Patient will benefit from skilled intervention to decrease level of assist with basic self-care skills prior to discharge home with care partner.  Anticipate patient will require minimal physical assistance and follow up home health.  OT - End of Session Endurance Deficit: Yes Endurance Deficit Description: fatigues quickly with activity and requires frequent rest breaks.  OT Assessment Rehab Potential (ACUTE ONLY): Good OT Barriers to Discharge: Home environment access/layout OT Patient demonstrates impairments in the following area(s): Balance;Endurance;Motor;Pain;Skin Integrity;Safety OT Basic ADL's Functional Problem(s): Grooming;Bathing;Dressing;Toileting OT Transfers Functional Problem(s): Toilet;Tub/Shower OT Additional Impairment(s): None OT Plan OT Intensity: Minimum of 1-2 x/day, 45 to 90 minutes OT Frequency: 5 out of 7 days OT Duration/Estimated Length of Stay: 14-18 days OT Treatment/Interventions: Balance/vestibular training;Discharge planning;Disease Lawyer;Functional mobility training;Pain management;Patient/family education;Psychosocial support;Self Care/advanced ADL retraining;Skin care/wound managment;Therapeutic Activities;Therapeutic Exercise;UE/LE Strength taining/ROM;Wheelchair propulsion/positioning OT Basic Self-Care Anticipated Outcome(s): Supervision/min assist OT Toileting Anticipated Outcome(s): min assist OT Bathroom Transfers Anticipated Outcome(s): min assist OT Recommendation Recommendations for Other Services: Neuropsych consult Patient destination: Home Follow Up Recommendations: Home health OT Equipment Recommended: To be determined   Skilled Therapeutic Intervention OT eval completed with discussion of rehab process, OT purpose, POC, ELOS, and goals.  ADL assessment  completed with bathing and dressing at sink from w/c level. Able to complete partial sit > stand with max assist, requiring +2 to achieve more full upright standing to allow for functional tasks of bathing/dressing.  Michaelyn Barter for additional support in standing with +2 while therapist washed buttocks and then +2 for clothing management while providing manual facilitation for postural control and upright standing.  Pt easily fatigued during activity requiring frequent rest breaks.    OT Evaluation Precautions/Restrictions  Precautions Precautions: Fall Precaution Comments: PD port, VAC left residual limb Restrictions Weight Bearing Restrictions: Yes LLE Weight Bearing: Non weight bearing General   Vital Signs Therapy Vitals Temp: 98.2 F (36.8 C) Temp Source: Oral Pulse Rate: 79 Resp: 18 BP: (!) 128/37 Patient Position (if appropriate): Lying Oxygen Therapy SpO2: 93 % O2 Device: Not Delivered Pain Pain Assessment Pain Assessment: 0-10 Pain Score: 2  Pain Type: Acute pain Pain Location: Leg Pain Orientation: Left Home Living/Prior Functioning Home Living Family/patient expects to be discharged to:: Private residence Living Arrangements: Spouse/significant other Available Help at Discharge: Family, Available 24 hours/day Type of Home: Mobile home Home Access: Stairs to enter Technical brewer of Steps: 1 Entrance Stairs-Rails: Right Home Layout: One level Bathroom Shower/Tub: Gaffer, Charity fundraiser: Standard Additional Comments: has a shower "stool" without back  Lives With: Spouse IADL History Homemaking Responsibilities: No Prior Function Level of Independence: Independent with basic ADLs, Independent with transfers, Requires assistive device for independence  Able to Take Stairs?: Yes Driving: Yes Vocation:  Retired Leisure: Hobbies-yes (Comment) (model trains with grandson, mostly watching tv) Comments: reports spending most of the day  watching TV ADL  See Function Navigator Vision Baseline Vision/History: Wears glasses Wears Glasses: At all times Patient Visual Report: No change from baseline Vision Assessment?: No apparent visual deficits Additional Comments: wears glasses Perception  Perception: Within Functional Limits Praxis Praxis: Intact Cognition Overall Cognitive Status: Within Functional Limits for tasks assessed Arousal/Alertness: Awake/alert Orientation Level: Person;Place;Situation Person: Oriented Place: Oriented Situation: Oriented Year: 2018 Month: November Day of Week: Incorrect (Friday) Memory: Appears intact Immediate Memory Recall: Bed;Blue;Sock Memory Recall: Bed;Blue Memory Recall Blue: Without Cue Memory Recall Bed: Without Cue Attention: Focused;Sustained Focused Attention: Appears intact Sustained Attention: Appears intact Awareness: Appears intact Problem Solving: Appears intact Safety/Judgment: Appears intact Sensation Sensation Light Touch: Appears Intact Proprioception: Appears Intact Coordination Gross Motor Movements are Fluid and Coordinated: Yes Fine Motor Movements are Fluid and Coordinated: Yes Motor  Motor Motor - Skilled Clinical Observations: generalized weakness throughout  Balance Balance Balance Assessed: Yes Static Sitting Balance Static Sitting - Balance Support: No upper extremity supported;Feet unsupported Static Sitting - Level of Assistance: 5: Stand by assistance Dynamic Sitting Balance Dynamic Sitting - Balance Support: During functional activity Dynamic Sitting - Level of Assistance: 4: Min assist Extremity/Trunk Assessment RUE Assessment RUE Assessment: Exceptions to WFL (ROM WFL, generalized weakness) LUE Assessment LUE Assessment: Exceptions to WFL (ROM WFL, generalized weakness)   See Function Navigator for Current Functional Status.   Refer to Care Plan for Long Term Goals  Recommendations for other services:  Neuropsych   Discharge Criteria: Patient will be discharged from OT if patient refuses treatment 3 consecutive times without medical reason, if treatment goals not met, if there is a change in medical status, if patient makes no progress towards goals or if patient is discharged from hospital.  The above assessment, treatment plan, treatment alternatives and goals were discussed and mutually agreed upon: by patient and by family  Stefannie Defeo, [facility] 01/18/2017, 11:38 AM

## 2017-01-18 NOTE — Progress Notes (Signed)
Team conference noted today. Pt is newly admitted to IP Rehab on 01/17/17 for L BKA with attached woundvac at and continuous therapy. Without any noted drainage this shift within canister. Left Leg wound is tender to palpation and movement. Stump shrinker is present ontop to assist in edema control. Abrasion noted to nasal bridge possibly from the use of the CPAP mask / machine at HS that measures 1.5 cm x 2 cm and is covered with a dry drsg. Right groin s/p surgery is well healing except for a 1.3 cm x 0.5 cm yellow area inferiorly that is left ota and is without any drainage. Right 5th Toe discolored area has characteristics of an arterial ulcer and is dry. Area is covered with dry drsg. Pt would benefit from a wound care consult to address all skin concerns. Right heel is soft and floated. Incontinent of B/B. A & O and able to make needs known. Continues on peritoneal dialysis with cath located at [facility] and covered with a C/D/I drsg. Wife at bedside. Requests percocet for pain management. T/C placed to on call provider and new order received for oxycodone IR  x 1 dose now. Safety maintained. Will continue to monitor.

## 2017-01-18 NOTE — Progress Notes (Signed)
Trish Mage, RN Rehab Admission Coordinator Signed Physical Medicine and Rehabilitation  PMR Pre-admission Date of Service: 01/17/2017 1:35 PM  Related encounter: Admission (Discharged) from 01/06/2017 in [facility]       Hide copied text PMR Admission Coordinator Pre-Admission Assessment  Patient: [name] is an 76 y.o., male MRN: [id number] DOB: 1940-06-11 Height:  (175.3 cm) Weight: 86.7 kg (191 lb 2.2 oz)                                                                                                                                              Insurance Information HMO: No    PPO:       PCP:       IPA:       80/20:       OTHER:   PRIMARY:  Medicare A and B      Policy#: [id number]      Subscriber: [name] CM Name:        Phone#:       Fax#:   Pre-Cert#:        Employer: Retired Benefits:  Phone #:       Name: Checked in Passport One source Eff. Date: 02/09/06     Deduct: $1340      Out of Pocket Max: none      Life Max: N/A CIR: 100%      SNF: 100 days Outpatient: 80%     Co-Pay: 20% Home Health: 100%      Co-Pay: none DME: 80%     Co-Pay: 20% Providers: patient's choice  SECONDARY: BCBS      Policy#: [id number]      Subscriber: [name] CM Name:        Phone#:       Fax#:   Pre-Cert#:        Employer: Retired Benefits:  Phone #: [phone]     Name:   Eff. Date:       Deduct:        Out of Pocket Max:        Life Max:   CIR:        SNF:   Outpatient:       Co-Pay:   Home Health:        Co-Pay:   DME:       Co-Pay:    Emergency Contact Information        Contact Information    Name Relation Home Work Mobile   Akamine,[name] Spouse [phone]  [phone]     Current Medical History  Patient Admitting Diagnosis: L BKA  History of Present Illness: A 76 y.o.malewith hx of HTN, CVA, ESRD on PD, DM on insulin, COPD and CHFw AICD, hx CAD/ CABG 2011. Pt was admitted on 12/15/16 for non-healing  L foot wound and R  groin pseudoaneurysm, underwent repair of PA R groin and also resection of left 3rd metatarsal head by Dr Randie Heinz. However, the left foot progressed and developed gangrenous changes, so on 9/14 pt went for L transmetatarsal amputation by Dr Lajoyce Corners. Post op abx were stopped and pt admitted to rehab on 12/27/16.   On 9/23 pt had fevers and persistent WBC and tachycardia. Started on [place] due to concerns of possible infection. UcX and Blood cx's were negative. CXR was negative. Pt had increasing L foot pain w/ some skin changes/ blistering and serous drainage from L TMA wound. Dr Lajoyce Corners was consulted and per the chart, possible plan is for surgical intervention next week. Discussed with Dr. Lajoyce Corners and will see the patient Monday. C/w Abx for now.   Patient developed Chest Pain on 9/30 so he was given full dose ASA, SL NTG, and a Stat EKG and Troponin was ordered. EKG reviewed and showed significant ST Depressions (agreed by Cardiologist Dr. Donnie Aho) so Cardiology was consulted and patient was placed on a NTG gtt and Heparin gtt and transferred to SDU. Patient underwent catheterization and required no stenting and there was no critical stenosis identified. Cardiology cleared the patient. 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 Lajoyce Corners.  PT/OT evaluations done with continued recommendations for inpatient rehab admission.  Patient to be admitted today for completion of comprehensive inpatient rehab program.  Past Medical History      Past Medical History:  Diagnosis Date  . AICD (automatic cardioverter/defibrillator) present   . CHF (congestive heart failure) (HCC)   . COPD (chronic obstructive pulmonary disease) (HCC)   . Coronary artery disease    CABG 2011  . Diabetes mellitus without complication (HCC)   . ESRD on peritoneal dialysis [facility])    Started PD Dec 2016 in [place]. Nephrology is in [place].   [name] History of peritonitis    PD cath  related peritonitis in April 2017  . Hypertension   . Peripheral vascular disease (HCC)   . Sjogren's syndrome (HCC)    Per pt's wife, was diagnosed in 2016 during w/u for cause of renal failure prior to starting dialysis.  He had a rash on his chest apparently prompting this w/u.  Never had renal bx.  He was referred to a rheum MD in [place] and per the wife he was treated with MTX and other medications but had side effects to "all of it" and isn't taking anything for this as of Jun 2017.   . Stroke [facility])     Family History  family history includes Heart disease in his father.  Prior Rehab/Hospitalizations: Was on CIR 12/27/16 to 01/06/17 with return to acute hospital after he developed chest pain.  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  Has the patient had major surgery during 100 days prior to admission? Yes.  Patient had a L TMA on 12/23/16.  Also had an ICD placed on 11/23/16.  Current Medications   Current Facility-Administered Medications:  .  0.9 %  sodium chloride infusion, 250 mL, Intravenous, PRN, Runell Gess, MD .  0.9 %  sodium chloride infusion, , Intravenous, Continuous, Bethena Midget, MD, Last Rate: 10 mL/hr at 01/11/17 1005 .  acetaminophen (TYLENOL) tablet 650 mg, 650 mg, Oral, Q6H PRN **OR** acetaminophen (TYLENOL) suppository 650 mg, 650 mg, Rectal, Q6H PRN, Nadara Mustard, MD .  aspirin chewable tablet 81 mg, 81 mg, Oral, Daily, Runell Gess, MD, 81 mg at 01/17/17 1005 .  bisacodyl (DULCOLAX) suppository 10 mg, 10 mg, Rectal, Daily PRN, Nadara Mustard, MD .  calcium acetate (PHOSLO) capsule 667 mg, 667 mg, Oral, TID WC, Delano Metz, MD, 667 mg at 01/17/17 1247 .  carvedilol (COREG) tablet 12.5 mg, 12.5 mg, Oral, QHS, Delano Metz, MD, 12.5 mg at 01/16/17 2230 .  clopidogrel (PLAVIX) tablet 75 mg, 75 mg, Oral, Q breakfast, Runell Gess, MD, 75 mg at 01/17/17 0606 .  Darbepoetin Alfa (ARANESP) injection 200 mcg, 200 mcg, Subcutaneous, Q  Tue-1800, Camille Bal, MD, 200 mcg at 01/10/17 2007 .  dialysis solution 2.5% low-MG/low-CA dianeal solution, , Intraperitoneal, 5 X Daily, Camille Bal, MD .  docusate sodium (COLACE) capsule 100 mg, 100 mg, Oral, BID, Nadara Mustard, MD, 100 mg at 01/17/17 1004 .  feeding supplement (NEPRO CARB STEADY) liquid 237 mL, 237 mL, Oral, BID BM, Delano Metz, MD, Last Rate: 0 mL/hr at 01/15/17 1900, 237 mL at 01/17/17 1416 .  feeding supplement (PRO-STAT SUGAR FREE 64) liquid 30 mL, 30 mL, Oral, BID, Narda Bonds, MD, 30 mL at 01/17/17 1012 .  gentamicin cream (GARAMYCIN) 0.1 % 1 application, 1 application, Topical, Daily, Annie Sable, MD, 1 application at 01/17/17 1022 .  heparin 2,500 Units in dialysis solution 2.5% low-MG/low-CA 5,000 mL dialysis solution, , Peritoneal Dialysis, PRN, Camille Bal, MD .  heparin injection 5,000 Units, 5,000 Units, Subcutaneous, Q8H, Marguerita Merles Bayshore, DO, 5,000 Units at 01/17/17 1416 .  hydrALAZINE (APRESOLINE) injection 10 mg, 10 mg, Intravenous, Q4H PRN, Runell Gess, MD .  insulin aspart (novoLOG) injection 0-9 Units, 0-9 Units, Subcutaneous, TID WC, Delano Metz, MD, 7 Units at 01/17/17 1247 .  insulin NPH Human (HUMULIN N,NOVOLIN N) injection 40 Units, 40 Units, Subcutaneous, QAC breakfast, 40 Units at 01/17/17 1014 **AND** insulin NPH Human (HUMULIN N,NOVOLIN N) injection 50 Units, 50 Units, Subcutaneous, QHS, Nettey, Ralph A, MD .  lactulose (CHRONULAC) 10 GM/15ML solution 30 g, 30 g, Oral, Q6H PRN, Elvis Coil, MD, 30 g at 01/16/17 1438 .  metoCLOPramide (REGLAN) tablet 5-10 mg, 5-10 mg, Oral, Q8H PRN **OR** metoCLOPramide (REGLAN) injection 5-10 mg, 5-10 mg, Intravenous, Q8H PRN, Nadara Mustard, MD .  midodrine (PROAMATINE) tablet 5 mg, 5 mg, Oral, BID WC, Delano Metz, MD, 5 mg at 01/17/17 1004 .  morphine 4 MG/ML injection 2 mg, 2 mg, Intravenous, Q1H PRN, Della Goo, RPH, 2 mg at 01/15/17 2237 .  multivitamin  (RENA-VIT) tablet 1 tablet, 1 tablet, Oral, QHS, Delano Metz, MD, 1 tablet at 01/16/17 2230 .  nitroGLYCERIN (NITROSTAT) SL tablet 0.4 mg, 0.4 mg, Sublingual, Q5 min PRN, Delano Metz, MD, 0.4 mg at 01/08/17 1320 .  ondansetron (ZOFRAN) tablet 4 mg, 4 mg, Oral, Q6H PRN **OR** ondansetron (ZOFRAN) injection 4 mg, 4 mg, Intravenous, Q6H PRN, Nadara Mustard, MD, 4 mg at 01/12/17 1017 .  oxyCODONE-acetaminophen (PERCOCET/ROXICET) 5-325 MG per tablet 1 tablet, 1 tablet, Oral, Q6H PRN, Delano Metz, MD, 1 tablet at 01/15/17 1542 .  polyethylene glycol (MIRALAX / GLYCOLAX) packet 17 g, 17 g, Oral, Daily, Narda Bonds, MD .  senna (SENOKOT) tablet 8.6 mg, 1 tablet, Oral, Daily, Narda Bonds, MD, 8.6 mg at 01/17/17 1004 .  sodium chloride flush (NS) 0.9 % injection 3 mL, 3 mL, Intravenous, Q12H, Delano Metz, MD, 3 mL at 01/16/17 1000 .  sodium chloride flush (NS) 0.9 % injection 3 mL, 3 mL, Intravenous, PRN, Delano Metz, MD .  sodium chloride flush (NS) 0.9 %  injection 3 mL, 3 mL, Intravenous, Q12H, Runell Gess, MD, 3 mL at 01/15/17 2248 .  sodium chloride flush (NS) 0.9 % injection 3 mL, 3 mL, Intravenous, PRN, Runell Gess, MD .  Vitamin D (Ergocalciferol) (DRISDOL) capsule 50,000 Units, 50,000 Units, Oral, Q30 days, Delano Metz, MD, 50,000 Units at 01/07/17 1014  Patients Current Diet: Diet Carb Modified Fluid consistency: Thin; Room service appropriate? Yes  Precautions / Restrictions Precautions Precautions: Fall Precaution Comments: PD port, VAC left residual limb Restrictions Weight Bearing Restrictions: Yes LLE Weight Bearing: Non weight bearing LLE Partial Weight Bearing Percentage or Pounds: Darco shoe   Has the patient had 2 or more falls or a fall with injury in the past year?No  Prior Activity Level Limited Community (1-2x/wk): Went out2-3 X a week.  Not driving since 04/12/70 when ICD was placed.  Wife drives.  Home Assistive Devices /  Equipment Home Assistive Devices/Equipment: CBG Meter, BIPAP, Blood pressure cuff, Eyeglasses, Scales, Other (Comment) (Peritoneal dialysis equipment) Home Equipment: Walker - 4 wheels, Cane - quad, Wheelchair - manual  Prior Device Use: Indicate devices/aids used by the patient prior to current illness, exacerbation or injury? Manual wheelchair.  Used wheelchair for shopping and for longer distances.  Prior Functional Level Prior Function Level of Independence: Needs assistance Gait / Transfers Assistance Needed: uses [name], RW ADL's / Homemaking Assistance Needed: A for socks, can bath self in shower Comments: reports hobbling around the house, using rollator as able. Legs would "give out" at times but denies having any falls.   Self Care: Did the patient need help bathing, dressing, using the toilet or eating?  Independent  Indoor Mobility: Did the patient need assistance with walking from room to room (with or without device)? Independent  Stairs: Did the patient need assistance with internal or external stairs (with or without device)? Independent  Functional Cognition: Did the patient need help planning regular tasks such as shopping or remembering to take medications? Independent  Current Functional Level Cognition  Overall Cognitive Status: Within Functional Limits for tasks assessed Current Attention Level: Sustained, Focused Orientation Level: Oriented X4 Following Commands: Follows one step commands inconsistently, Follows one step commands with increased time General Comments: Pt requires mod - max cues for motor planning.  Easily distracts.  Follow one step commands intermittently.  ? if cognitive deficits may be med related.  RN aware     Extremity Assessment (includes Sensation/Coordination)  Upper Extremity Assessment: Defer to OT evaluation  Lower Extremity Assessment: Defer to PT evaluation LLE Deficits / Details:  knee and hip strength grossly 3-/5      ADLs  Overall ADL's : Needs assistance/impaired Eating/Feeding: Set up, Sitting Grooming: Wash/dry hands, Wash/dry face, Oral care, Brushing hair, Minimal assistance, Sitting Upper Body Bathing: Maximal assistance, Sitting Lower Body Bathing: Maximal assistance, Sitting/lateral leans, Bed level Upper Body Dressing : Maximal assistance, Sitting Lower Body Dressing: Total assistance, Sit to/from stand, Sitting/lateral leans Toilet Transfer: Maximal assistance, +2 for physical assistance, Stand-pivot, [facility] Toilet Transfer Details (indicate cue type and reason): Attempted transfer to [facility] but unable to complete due to weakness.  Toileting- Clothing Manipulation and Hygiene: Total assistance, Sit to/from stand Functional mobility during ADLs: Maximal assistance, +2 for physical assistance General ADL Comments: Pt able to complete sit to stand transfer x 2 with max assist +2.     Mobility  Overal bed mobility: Needs Assistance Bed Mobility: Supine to Sit Supine to sit: Mod assist Sit to supine: Min guard General bed mobility  comments: Pt sitting on side of bed on arrival with wife.  Pt with flexed     Transfers  Overall transfer level: Needs assistance Equipment used:  (none) Transfer via Lift Equipment: St[place] Transfers: Lateral/Scoot Transfers Sit to Stand: Max assist, +2 physical assistance Stand pivot transfers: +2 physical assistance, Total assist (sitting on [place])  Lateral/Scoot Transfers: Mod assist General transfer comment: Pt performed lateral scoot from bed to WC and WC to drop arm recliner chair.  Pt required cues for hand placement, forward weight shifting and scooting from surface to surface.      Ambulation / Gait / Stairs / Wheelchair Mobility  Ambulation/Gait Ambulation/Gait assistance:  (Pt is not safe to ambulate at this time due to weakness.  ) Wheelchair Mobility Wheelchair mobility: Yes Wheelchair propulsion: Both upper extremities, Right lower  extremity Wheelchair parts: Needs assistance Distance: 80 ft, min assist for forward propulsion and cues for turning, backing and using RLE.  Pt fatigues quickly and required rest breaks x 2.   Wheelchair Assistance Details (indicate cue type and reason): Pt required total assistance for setup of WC for transfers, locking brakes, and WC parts.      Posture / Balance Dynamic Sitting Balance Sitting balance - Comments: Pt with difficulty maintaining upright sitting posture. Left lateral lean Balance Overall balance assessment: Needs assistance Sitting-balance support: Feet supported, Bilateral upper extremity supported Sitting balance-Leahy Scale: Fair Sitting balance - Comments: Pt with difficulty maintaining upright sitting posture. Left lateral lean Postural control: Posterior lean Standing balance support: Bilateral upper extremity supported Standing balance-Leahy Scale:  (NT) Standing balance comment: requires max A +2.  could not achieve full upright with pt flexed trunk    Special needs/care consideration BiPAP/CPAP Yes, Bipap CPM No Continuous Drip IV KVO Dialysis Yes, PD        Days: Nightly for 10.5 hrs Life Vest No, has an ICD/defibrillator in place Oxygen NO Special Bed No Trach Size No Wound Vac (area) Yes      Location L BKA site for 7 days Skin:  New L BKA post op incision with VAC in place                             Bowel mgmt: Last BM 01/16/17 Bladder mgmt: Voiding in urinal Diabetic mgmt Yes, on insulin at home    Previous Home Environment Living Arrangements: Spouse/significant other  Lives With: Spouse Available Help at Discharge: Family, Available 24 hours/day Type of Home: Mobile home Home Layout: One level Home Access: Stairs to enter Entrance Stairs-Rails: Left Entrance Stairs-Number of Steps: 1 Bathroom Shower/Tub: Psychologist, counselling, Sport and exercise psychologist: Standard Home Care Services: No  Discharge Living Setting Plans for Discharge Living  Setting: Lives with (comment), Mobile Home (Lives with wife in a mobile home.) Type of Home at Discharge: Mobile home (Single wide.) Discharge Home Layout: One level Discharge Home Access: Stairs to enter Entrance Stairs-Number of Steps: 1 step up and then threshold step into mobile home Does the patient have any problems obtaining your medications?: No  Social/Family/Support Systems Patient Roles: Spouse, Parent (Has a wife, daughter and a son.) Contact Information: Hung Rhinesmith - wife Anticipated Caregiver: Wife Anticipated Caregiver's Contact Information: Burna Mortimer - wife - [phone] Ability/Limitations of Caregiver: Wife does not work and can assist at home. Caregiver Availability: 24/7 Discharge Plan Discussed with Primary Caregiver: Yes Is Caregiver In Agreement with Plan?: Yes Does Caregiver/Family have Issues with Lodging/Transportation while Pt is in Rehab?:  No   Goals/Additional Needs Patient/Family Goal for Rehab: PT/OT supervision to min assist goals Expected length of stay: 14-18 days Cultural Considerations: None Dietary Needs: Carb mod, med cal, thin liquids Equipment Needs: TBD Special Service Needs: On Peritoneal dialysis 10.5 hours each night. Pt/Family Agrees to Admission and willing to participate: Yes Program Orientation Provided & Reviewed with Pt/Caregiver Including Roles  & Responsibilities: Yes  Decrease burden of Care through IP rehab admission: N/A  Possible need for SNF placement upon discharge: No, with PD, SNF is not an option.  Patient Condition: This patient's medical and functional status has changed since the consult dated: 12/26/16 in which the Rehabilitation Physician determined and documented that the patient's condition is appropriate for intensive rehabilitative care in an inpatient rehabilitation facility. See "History of Present Illness" (above) for medical update. Functional changes are: Currently requiring max to total assist +2 for  transfers. Patient's medical and functional status update has been discussed with the Rehabilitation physician and patient remains appropriate for inpatient rehabilitation. Will admit to inpatient rehab today.  Preadmission Screen Completed By:  Trish Mage, 01/17/2017 3:32 PM ______________________________________________________________________   Discussed status with Dr. Riley Kill on 01/17/17 at 1531 and received telephone approval for admission today.  Admission Coordinator:  Trish Mage, time 1532/Date 01/17/17       Cosigned by: Ranelle Oyster, MD at 01/17/2017 4:13 PM  Revision History

## 2017-01-18 NOTE — Progress Notes (Signed)
Patient information reviewed and entered into eRehab system by Tora Duck, RN, CRRN, PPS Coordinator.  Information including medical coding and functional independence measure will be reviewed and updated through discharge.     Per nursing patient/wife was given "Data Collection Information Summary for Patients in Inpatient Rehabilitation Facilities with attached "Privacy Act Statement-Health Care Records" upon admission.  Patient recently on CIR and information was a review.

## 2017-01-18 NOTE — Progress Notes (Signed)
[facility] ROUNDING NOTE   Subjective:   Interval History:75 y.o.malewith hx of HTN, CVA, ESRD on PD, DM on insulin, COPD and CHFw AICD, hx CAD/ CABG 2011. Pt was admitted on 12/15/16 for non-healing L foot wound and R groin pseudoaneurysm, underwent repair of PA R groin and also resection of left 3rd metatarsal head by Dr Randie Heinz. However, the left foot progressed and developed gangrenous changes, so on 9/14 pt went for L transmetatarsal amputation by Dr Lajoyce Corners.He had a cardiac catheterization demonstrating no critical stenosis and cleared for surgery He continues on peritoneal dialysis(home = 5 2.5 L fills, all 2.5% dialysate, 10 min/1.5 hours/20 min fill/dwell/drain cycle with pause and daytime dwell; altered in the hospital for ease, doing fine)  Objective:  Vital signs in last 24 hours:  Temp:  [97.8 F (36.6 C)-99.3 F (37.4 C)] 98.2 F (36.8 C) (10/10 0745) Pulse Rate:  [77-92] 79 (10/10 0745) Resp:  [18-20] 18 (10/10 0745) BP: (114-150)/(37-55) 128/37 (10/10 0745) SpO2:  [90 %-99 %] 93 % (10/10 0745) Weight:  [187 lb 6.3 oz (85 kg)-198 lb 14.2 oz (90.2 kg)] 198 lb 14.2 oz (90.2 kg) (10/10 0606)  Weight change:  Filed Weights   01/17/17 1830 01/17/17 1833 01/18/17 0606  Weight: 187 lb 6.3 oz (85 kg) 198 lb 9.6 oz (90.1 kg) 198 lb 14.2 oz (90.2 kg)    Intake/Output: I/O last 3 completed shifts: In: 240 [P.O.:240] Out: -    Intake/Output this shift:  Total I/O In: 16109 [P.O.:120; Other:12495] Out: 60454 [Other:13501]  CVS RRR Lungs diminished at bases otherwise clear anteriorly Abdomen +BS, soft and not tender. PD catheter left abd with dry dressings Trace -1+ RLE edema L TMA wrapped and some LLE edema Dialysis Access: PD cath, left upper arm AVF   Basic Metabolic Panel:  Recent Labs Lab 01/13/17 0238 01/14/17 0416 01/15/17 0212 01/16/17 0444 01/18/17 0624  NA 130* 130* 132* 133* 129*  K 3.7 3.6 3.3* 3.7 3.3*  CL 91* 92* 91* 92* 90*  CO2  GLUCOSE 360* 328* 348* 145* 373*  BUN 46* 49* 50* 61* 53*  CREATININE 6.94* 7.26* 7.30* 8.20* 7.61*  CALCIUM 7.9* 8.1* 8.5* 8.5* 8.6*  PHOS 5.4*  --   --  4.6  --     Liver Function Tests:  Recent Labs Lab 01/12/17 0916 01/13/17 0238 01/14/17 0416 01/15/17 0212 01/16/17 0444 01/18/17 0624  AST 437* 308* 185* 132*  --  43*  ALT 52 19 9* 7*  --  9*  ALKPHOS 60 61 58 64  --  70  BILITOT 0.3 0.5 0.5 0.4  --  0.4  PROT 5.6* 5.4* 5.6* 5.8*  --  5.4*  ALBUMIN 1.3* 1.3* 1.3* 1.3* 1.4* 1.2*   No results for input(s): LIPASE, AMYLASE in the last 168 hours. No results for input(s): AMMONIA in the last 168 hours.  CBC:  Recent Labs Lab 01/12/17 0916 01/13/17 0238 01/15/17 0802 01/17/17 1634 01/18/17 0624  WBC 10.5 8.1 11.2* 12.8* 10.1  NEUTROABS  --   --   --   --  7.6  HGB 8.4* 7.9* 7.7* 8.3* 7.8*  HCT 26.6* 25.1* 24.5* 26.2* 25.4*  MCV 96.0 95.4 96.5 97.4 97.3  PLT 437* 404* 436* 501* 462*    Cardiac Enzymes: No results for input(s): CKTOTAL, CKMB, CKMBINDEX, TROPONINI in the last 168 hours.  BNP: Invalid input(s): POCBNP  CBG:  Recent Labs Lab 01/17/17 0617 01/17/17 1146 01/17/17 1636 01/17/17  2123 01/18/17 0613  GLUCAP 387* 309* 229* 342* 385*    Microbiology: Results for orders placed or performed during the hospital encounter of 01/06/17  MRSA PCR Screening     Status: None   Collection Time: 01/08/17  4:15 PM  Result Value Ref Range Status   MRSA by PCR NEGATIVE NEGATIVE Final    Comment:        The GeneXpert MRSA Assay (FDA approved for NASAL specimens only), is one component of a comprehensive MRSA colonization surveillance program. It is not intended to diagnose MRSA infection nor to guide or monitor treatment for MRSA infections.   Surgical pcr screen     Status: None   Collection Time: 01/11/17  4:00 AM  Result Value Ref Range Status   MRSA, PCR NEGATIVE NEGATIVE Final   Staphylococcus aureus NEGATIVE NEGATIVE Final     Comment: (NOTE) The Xpert SA Assay (FDA approved for NASAL specimens in patients 2 years of age and older), is one component of a comprehensive surveillance program. It is not intended to diagnose infection nor to guide or monitor treatment.     Coagulation Studies: No results for input(s): LABPROT, INR in the last 72 hours.  Urinalysis: No results for input(s): COLORURINE, LABSPEC, PHURINE, GLUCOSEU, HGBUR, BILIRUBINUR, KETONESUR, PROTEINUR, UROBILINOGEN, NITRITE, LEUKOCYTESUR in the last 72 hours.  Invalid input(s): APPERANCEUR    Imaging: No results found.   Medications:   . dialysis solution 2.5% low-MG/low-CA     . aspirin  81 mg Oral Daily  . atorvastatin  80 mg Oral q1800  . calcium acetate  667 mg Oral TID WC  . carvedilol  12.5 mg Oral QHS  . clopidogrel  75 mg Oral Q breakfast  . darbepoetin (ARANESP) injection - NON-DIALYSIS  200 mcg Subcutaneous Q Tue-1800  . docusate sodium  100 mg Oral BID  . feeding supplement (PRO-STAT SUGAR FREE 64)  30 mL Oral BID  . gentamicin cream  1 application Topical BID  . heparin subcutaneous  5,000 Units Subcutaneous Q8H  . insulin aspart  0-5 Units Subcutaneous QHS  . insulin aspart  0-9 Units Subcutaneous TID WC  . insulin NPH Human  40 Units Subcutaneous QAC breakfast   And  . insulin NPH Human  50 Units Subcutaneous QHS  . midodrine  5 mg Oral BID WC  . multivitamin  1 tablet Oral QHS  . protein supplement shake  2 oz Oral TID WC  . senna-docusate  1 tablet Oral QHS  . [START ON 02/04/2017] Vitamin D (Ergocalciferol)  50,000 Units Oral Q30 days   acetaminophen, aluminum hydroxide, bisacodyl, diphenhydrAMINE, guaiFENesin-dextromethorphan, dianeal solution for CAPD/CCPD with heparin, lactulose, nitroGLYCERIN, ondansetron **OR** ondansetron (ZOFRAN) IV, oxyCODONE, simethicone, sodium phosphate, traZODone  Assessment/ Plan:  1. PAD- s/p open repair of R common femoral artery pseudoaneurysm (VVS) and L transmet 12/23/16  (Dr. Lajoyce Corners). 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 Lajoyce Corners) 2. Chest pain/+ troponins. Prior CABG. Had stent to RCA 07/2016 d/t NSTEMI. Has ICD in place. Recurrent CP/+ trops. Cath 10/1 no intervention required.  3. ESRD- CCPD, using 2.5% dextrose at home; Altered regimen for ease in hospital (no pause or day bag) Had been using 1/2 and 1/2 past few days Using all 2.5 % bags for now BP controlled for now  4. Orthostatic hypotension - midodrine 5. Anemia (ABLA)- TSAT 45% and Ferritin 2284, ^Aranesp 200 qThurs. Not given yet this admission that I can see (ordered "with HD" which may be the issue). Hb 7.8 6. Constipation:  miralax as needed 7. Hypokalemia: Monitor for need for replacement. Give 30 po today.  8. Myoclonus / Confusion: stopped gabapentin and robaxin. Not confused past 2 days that I can see 9. Secondary HPT - phoslo. Regular diet to promote nutrition 10. DM - per primary team 11. Low grade temp- resolved    blood cultures neg- fluid cell count fine -  S/p BKA  12. Hypokalemia will replete   LOS: 1 [place] W  :25 AM

## 2017-01-18 NOTE — Progress Notes (Signed)
Physical Therapy Assessment and Plan  Patient Details  Name: [name] MRN: [id number] Date of Birth: [birth year]  PT Diagnosis: Abnormal posture, Difficulty walking and Muscle weakness Rehab Potential: Good ELOS: 14-18   Today's Date: 01/18/2017 PT Individual Time: 0820-0930 PT Individual Time Calculation (min): 70 min    Problem List:  Patient Active Problem List   Diagnosis Date Noted  . Ischemic pain of right foot   . Arterial hypotension   . Diabetes mellitus type 2 in obese ([place])   . PVD (peripheral vascular disease) ([place])   . Delirium   . Unilateral complete BKA, right, sequela ([place]) 01/17/2017  . Acute blood loss anemia   . Elevated AST (SGOT) 01/09/2017  . Chest pain 01/09/2017  . NSTEMI (non-ST elevated myocardial infarction) ([place]) 01/09/2017  . Hypokalemia 01/07/2017  . Hypomagnesemia 01/07/2017  . Left foot infection 01/06/2017  . Chronic hypotension 01/06/2017  . 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 IDDM (insulin dependent diabetes mellitus) ([place]) 01/06/2017  . Foot infection 01/06/2017  . Labile blood pressure   . Hallucination   . Benign essential HTN   . Ulcer of toe of right foot ([place])   . Ulcer of external ear, limited to breakdown of skin ([place])   . Chronic congestive heart failure ([place])   . Anemia of chronic disease   . Labile blood glucose   . Uncontrolled diabetes mellitus type 2 with peripheral artery disease ([place])   . Diabetic peripheral neuropathy ([place])   . Debility 12/27/2016  . History of transmetatarsal amputation of left foot ([place]) 12/27/2016  . Constipation   . Chronic obstructive pulmonary disease ([place])   . Nausea   . ESRD on dialysis ([place])   . Leukocytosis   . Tachypnea   . FUO (fever of unknown origin)   . Encephalopathy 12/23/2016  . Sore throat 12/23/2016  . Gangrene of left foot ([place]) 11/26/2016  . PAD (peripheral artery disease) ([place]) 11/26/2016  . Sjogren's syndrome ([place]) 09/26/2015  .  Uncontrolled type 2 diabetes mellitus with hyperglycemia, with long-term current use of insulin ([place]) 09/26/2015  . Hyperosmolar non-ketotic state in patient with type 2 diabetes mellitus ([place]) 09/25/2015  . Ileus ([place]) 09/22/2015  . Acute respiratory failure with hypoxia ([place]) 09/22/2015  . Acute gouty arthritis 09/22/2015  . CAD (coronary artery disease) of artery bypass graft 09/22/2015  . Sepsis ([place]) 09/21/2015  . Elevated troponin level 09/21/2015  . ESRD on peritoneal dialysis ([place]) 09/21/2015  . CVA (cerebral infarction) 09/21/2015    Past Medical History:  Past Medical History:  Diagnosis Date  . AICD (automatic cardioverter/defibrillator) present   . CHF (congestive heart failure) ([place])   . COPD (chronic obstructive pulmonary disease) ([place])   . Coronary artery disease    CABG 2011  . Diabetes mellitus without complication ([place])   . ESRD on peritoneal dialysis [facility])    Started PD Dec 2016 in [place]. Nephrology is in [place].   [name] History of peritonitis    PD cath related peritonitis in April 2017  . Hypertension   . Peripheral vascular disease ([place])   . Sjogren's syndrome ([place])    Per pt's wife, was diagnosed in 2016 during w/u for cause of renal failure prior to starting dialysis.  He had a rash on his chest apparently prompting this w/u.  Never had renal bx.  He was referred to a rheum MD in [place] and per the wife he was treated with MTX and other medications but had  side effects to "all of it" and isn't taking anything for this as of Jun 2017.   . Stroke [facility])    Past Surgical History:  Past Surgical History:  Procedure Laterality Date  . ABDOMINAL AORTOGRAM W/LOWER EXTREMITY N/A 11/29/2016   Procedure: ABDOMINAL AORTOGRAM W/LOWER EXTREMITY;  Surgeon: Serafina Mitchell, MD;  Location: [place] CV LAB;  Service: Cardiovascular;  Laterality: N/A;  . AMPUTATION Left 11/29/2016   Procedure: LEFT THIRD TOE AMPUTATION;  Surgeon: Serafina Mitchell, MD;  Location: [facility]  OR;  Service: Vascular;  Laterality: Left;  . AMPUTATION Left 12/23/2016   Procedure: Left Transmetatarsal Amputation;  Surgeon: Newt Minion, MD;  Location: [place];  Service: Orthopedics;  Laterality: Left;  . AMPUTATION Left 01/11/2017   Procedure: LEFT BELOW KNEE AMPUTATION;  Surgeon: Newt Minion, MD;  Location: [place];  Service: Orthopedics;  Laterality: Left;  . ANGIOPLASTY  12/15/2016   Procedure: BALLOON ANGIOPLASTY;  Surgeon: Waynetta Sandy, MD;  Location: [place];  Service: Vascular;;  . cardiac catherization with stent placement  2017  . CORONARY ARTERY BYPASS GRAFT  2011  . FALSE ANEURYSM REPAIR Right 12/15/2016   Procedure: REPAIR FALSE ANEURYSM;  Surgeon: Waynetta Sandy, MD;  Location: [place];  Service: Vascular;  Laterality: Right;  . IRRIGATION AND DEBRIDEMENT FOOT Left 12/15/2016   Procedure: IRRIGATION AND DEBRIDEMENT FOOT;  Surgeon: Waynetta Sandy, MD;  Location: [place];  Service: Vascular;  Laterality: Left;  . LEFT HEART CATH AND CORS/GRAFTS ANGIOGRAPHY N/A 01/09/2017   Procedure: LEFT HEART CATH AND CORS/GRAFTS ANGIOGRAPHY;  Surgeon: Lorretta Harp, MD;  Location: [place] CV LAB;  Service: Cardiovascular;  Laterality: N/A;  . LOWER EXTREMITY ANGIOGRAM Left 12/15/2016   Procedure: LOWER EXTREMITY ANGIOGRAM; PEDAL ACCESS;  Surgeon: Waynetta Sandy, MD;  Location: [place];  Service: Vascular;  Laterality: Left;  . PERIPHERAL VASCULAR BALLOON ANGIOPLASTY  11/29/2016   Procedure: PERIPHERAL VASCULAR BALLOON ANGIOPLASTY;  Surgeon: Serafina Mitchell, MD;  Location: [place] CV LAB;  Service: Cardiovascular;;  LT Peroneal AT attempted unsuccessful    Assessment & Plan Clinical Impression: Joshua Beedle Comptonis a 76 y.o.malewith history of COPD, T2DM, CAD s/p ICD, ESRD- on PD, PAD with critical limb ischemia with non-healing LLE wound who was admitted for right femoral pseudoaneurysm repair and resection of Left 3rd MT head on 12/15/16 by Dr.  Donzetta Matters. Hospital course complicated by N/V, leucocytosis and fever likely due to sepsis. GB ultrasound revealed GB polyp without acute cholecystitis. He has had difficulty swallowing due to oral pharyngeal ulcers and Dr. Constance Holster recommended supportive care for management. He has had issues with right groin wound dehiscence of wound with gangrenous changes left foot. 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 Sharol Given on 9/14 and right groin hematoma showed decrease in erythema.  He was admitted to CIR on 12/27/2016 due to deficits in mobility and decreased ability to carry out ADL tasks. His was noted to have hypotensive episodes with fatigue and frequently during the pm sessions. Nephrology has been following for management of PD and decreased amount of fluid removed with PD. Insulin has been titrated upwards slowly to prevent hypoglycemic episodes as blood sugars have been difficulty to control due to variable intake. He developed increase in pain left foot with ischemic changes and skin break down requiring surgical intervention. He was discharged to acute on 01/06/17 due to ongoing fevers, delirium with hallucinations, hypotension and concerns of developing sepsis. He did develop jaw  pain radiating to chest on 9/30 with SEMI and elevated troponin. He was started on IV heparin and Cardiac cath did not show critical stenosis--he was felt to be moderate risk for surgery. He underwent left transtibial amputation on 10/03 by Dr. Sharol Given and has defervesced. Therapy ongoing and patient with decreased balance, difficulty following simple command consistently as well as debility compounded by L-BKA. CIR recommended to resume rehab course. Patient transferred to CIR on 01/17/2017 .   Patient currently requires max with mobility secondary to muscle weakness and decreased sitting balance, decreased postural control and decreased balance strategies.  Prior to hospitalization, patient was modified  independent  with mobility and lived with Spouse in a Mobile home home.  Home access is 1Stairs to enter.  Patient will benefit from skilled PT intervention to maximize safe functional mobility and minimize fall risk for planned discharge home with 24 hour assist.  Anticipate patient will benefit from follow up [facility] at discharge.  PT - End of Session Endurance Deficit: Yes Endurance Deficit Description: fatigues quickly with activity and requires frequent rest breaks.  PT Assessment Rehab Potential (ACUTE/IP ONLY): Good PT Barriers to Discharge: Home environment access/layout PT Barriers to Discharge Comments: step to enter home PT Patient demonstrates impairments in the following area(s): Balance;Endurance;Motor PT Transfers Functional Problem(s): Bed Mobility;Bed to Chair;Car;Furniture;Floor PT Locomotion Functional Problem(s): Ambulation;Wheelchair Mobility;Stairs PT Plan PT Intensity: Minimum of 1-2 x/day ,45 to 90 minutes PT Frequency: 5 out of 7 days PT Duration Estimated Length of Stay: 14-18 PT Treatment/Interventions: Ambulation/gait training;Balance/vestibular training;Community reintegration;Discharge planning;Disease management/prevention;DME/adaptive equipment instruction;Functional mobility training;Neuromuscular re-education;Pain management;Patient/family education;Psychosocial support;Skin care/wound management;Stair training;Therapeutic Activities;Therapeutic Exercise;UE/LE Strength taining/ROM;Wheelchair propulsion/positioning PT Transfers Anticipated Outcome(s): min assist transfers with LRAD PT Locomotion Anticipated Outcome(s): primarily w/c from mobility, possible ambulation short distances PT Recommendation Recommendations for Other Services: Therapeutic Recreation consult Therapeutic Recreation Interventions: Outing/community reintergration Follow Up Recommendations: Home health PT Patient destination: Home Equipment Recommended: Wheelchair cushion  (measurements);Wheelchair (measurements);Sliding board;Rolling walker with 5" wheels Equipment Details: family has transport chair at home  Skilled Therapeutic Intervention  Pt seen for initial evaluation and treatment. Squat pivot transfers performed with max assist from bed, w/c, and car simulator. Working on sequence and weight shifting. Educating pt on positioning of LLE and need to avoid knee flexion. Modified w/c to allow for knee extension when sitting. Discussed general goals and progression of therapy with pt and spouse. Following session, pt up in w/c with all needs in reach (QRB on) and spouse present.   PT Evaluation Precautions/Restrictions Precautions Precautions: Fall Precaution Comments: PD port, VAC left residual limb Restrictions Weight Bearing Restrictions: Yes LLE Weight Bearing: Non weight bearing  Pain Pt rates his pain as 0/10 during session but expressing pain intermittently with mobility. Monitored during session.   Home Living/Prior Functioning Home Living Available Help at Discharge: Family;Available 24 hours/day Type of Home: Mobile home Home Access: Stairs to enter Entrance Stairs-Number of Steps: 1 Entrance Stairs-Rails: Right Home Layout: One level Bathroom Shower/Tub: Walk-in shower;Door [employer] Toilet: Standard Additional Comments: has a shower "stool" without back  Lives With: Spouse Prior Function Level of Independence: Independent with basic ADLs;Independent with transfers;Requires assistive device for independence  Able to Take Stairs?: Yes Driving: Yes Vocation: Retired Leisure: Hobbies-yes (Comment) (model trains with grandson, mostly watching tv) Comments: reports spending most of the day watching TV Vision/Perception  Vision - Assessment Additional Comments: wears glasses Perception Perception: Within Functional Limits Praxis Praxis: Intact  Cognition Overall Cognitive Status: Within Functional Limits for tasks  assessed  Arousal/Alertness: Awake/alert Orientation Level: Oriented X4 Attention: Focused;Sustained Focused Attention: Appears intact Sustained Attention: Appears intact Memory: Appears intact Awareness: Appears intact Problem Solving: Appears intact Safety/Judgment: Appears intact Sensation Sensation Light Touch: Appears Intact Proprioception: Appears Intact Coordination Gross Motor Movements are Fluid and Coordinated: Yes Motor  Motor Motor - Skilled Clinical Observations: generalized weakness throughout     Balance Balance Balance Assessed: Yes Static Sitting Balance Static Sitting - Balance Support: No upper extremity supported;Feet unsupported Static Sitting - Level of Assistance: 5: Stand by assistance Dynamic Sitting Balance Dynamic Sitting - Balance Support: During functional activity Dynamic Sitting - Level of Assistance: 4: Min assist Extremity Assessment  RUE Assessment RUE Assessment: Exceptions to [facility] (generalized weakness) LUE Assessment LUE Assessment: Exceptions to [facility] (generalized weakness) RLE Strength RLE Overall Strength Comments: Grossly 4-/5 with knee extension, knee flexion, dorsiflexion/plantarflexion.  LLE Assessment LLE Assessment: Exceptions to [facility] LLE Strength LLE Overall Strength Comments: Hip flexion 3-/5, hip abduction 3/5, adduction 3/5   See Function Navigator for Current Functional Status.   Refer to Care Plan for Long Term Goals  Recommendations for other services: Neuropsych and Therapeutic Recreation  Outing/community reintegration  Discharge Criteria: Patient will be discharged from PT if patient refuses treatment 3 consecutive times without medical reason, if treatment goals not met, if there is a change in medical status, if patient makes no progress towards goals or if patient is discharged from hospital.  The above assessment, treatment plan, treatment alternatives and goals were discussed and mutually agreed upon: by  patient and by family  Linard Millers, PT 01/18/2017, 10:50 AM

## 2017-01-18 NOTE — Progress Notes (Signed)
Progress Note    01/18/2017 10:07 AM * No surgery found *  Subjective:  Sitting in wheelchair-no complaints  Tm 99.3 now afebrile  Vitals:   01/18/17 0606 01/18/17 0745  BP: (!) 114/55 (!) 128/37  Pulse: 77 79  Resp: 18 18  Temp: 97.8 F (36.6 C) 98.2 F (36.8 C)  SpO2: 99% 93%    Physical Exam: Lungs:  Non labored Incisions:  able to see proximal portion of right groin, which was moist, but looks okay.  CBC    Component Value Date/Time   WBC 10.1 01/18/2017 0624   RBC 2.61 (L) 01/18/2017 0624   HGB 7.8 (L) 01/18/2017 0624   HCT 25.4 (L) 01/18/2017 0624   PLT 462 (H) 01/18/2017 0624   MCV 97.3 01/18/2017 0624   MCH 29.9 01/18/2017 0624   MCHC 30.7 01/18/2017 0624   RDW 16.2 (H) 01/18/2017 0624   LYMPHSABS 1.3 01/18/2017 0624   MONOABS 1.0 01/18/2017 0624   EOSABS 0.2 01/18/2017 0624   BASOSABS 0.0 01/18/2017 0624    BMET    Component Value Date/Time   NA 129 (L) 01/18/2017 0624   K 3.3 (L) 01/18/2017 0624   CL 90 (L) 01/18/2017 0624   CO2 27 01/18/2017 0624   GLUCOSE 373 (H) 01/18/2017 0624   BUN 53 (H) 01/18/2017 0624   CREATININE 7.61 (H) 01/18/2017 0624   CALCIUM 8.6 (L) 01/18/2017 0624   GFRNONAA 6 (L) 01/18/2017 0624   GFRAA 7 (L) 01/18/2017 0624    INR    Component Value Date/Time   INR 1.20 01/09/2017 1424     Intake/Output Summary (Last 24 hours) at 01/18/17 1007 Last data filed at 01/18/17 0900  Gross per 24 hour  Intake            12855 ml  Output            13501 ml  Net             -646 ml     Assessment:  76 y.o. male is s/p:  1. Open repair of right common femoral pseudoaneurysm 2. US guided cannulation of left anterior tibial artery 3. CSI orbital atherectomy of left AT with 1.5 diamondback 4. Balloon angioplasty of left AT with 3mm balloon, Left DP with 2.45mm balloon 5. Resection of left 3rd metatarsal head 6.Right lower extremity angiogram  12/15/16 And  Left TMA 12/23/16 (Dr. Lajoyce Corners) And subsequently Left  below knee amputation 01/11/17 (Dr. Lajoyce Corners)   Plan: -asked to evaluate pt's right groin and right foot--pt sitting in wheelchair at the time and able to see proximal portion of right groin, which was moist, but looks okay.  Dry gauze placed to right groin -Will come back to evaluate more closely later today when pt is in bed and evaluate his right foot.  -doing well with left BKA-less pain than the TMA   Doreatha Massed, PA-C [facility] [phone] 01/18/2017 10:07 AM  ADDENDUM: Pt seen and examined with [name].  Right groin looks fine without infection.  Keep dry gauze to right groin and change daily and as needed.  Does not need to be taped, just tucked.  Replace after ambulating.  Right 5th toe:   Toe wound has worsened.  [name] had extensive conversation with pt and wife.  He does not feel this wound is going to heal.  Discussed pro's and con's of proceeding with toe amputation and risk of it not healing and needing more proximal amputation.  He also encouraged pt to keep spirits up and stay motivated to have a good outcome with current situation.  Doreatha Massed, [facility] 01/18/2017 12:46 PM  Right groin incision with small skin separation, continue to monitor.  No need for intervention currently.  Discussed possibly proceeding with right 5th toe amputation on Friday.  The patient is going to decide if he wants to proceed, understanding that this may also end up in a BKA if he can not heal the wound.  Durene Cal

## 2017-01-18 NOTE — Progress Notes (Signed)
Pt resting quietly. Easily aroused. C/o pain in left LE and requests percocet. NO received for oxycodone  IR and 1 dose and effective. Pt and wife educated on all HS medications and once time dose of Oxycodone. Swallows pills whole with thin liquids. Skin with multiple areas of concern identified on team conference progress note. callbell within reach. Safety maintained, will continue to monitor.

## 2017-01-18 NOTE — Progress Notes (Signed)
Physical Therapy Session Note  Patient Details  Name: [name] MRN: [id number] Date of Birth: 01/17/1941  Today's Date: 01/18/2017 PT Individual Time: 1133-1200 PT Individual Time Calculation (min): 27 min   Short Term Goals: Week 1:  PT Short Term Goal 1 (Week 1): supervision supine<>sitting PT Short Term Goal 2 (Week 1): Pt to transfer bed<>w/c with sliding board and mod assist PT Short Term Goal 3 (Week 1): Pt to propel w/c X 75 feet with supervision  Skilled Therapeutic Interventions/Progress Updates:  Pt presented in w/c stating increased fatigue but agreeable to therapy. Session focused on w/c management and endurance vua w/c propulsion. Performed w/c mobility approx 20ft with significantly increased time. Cues for increased retraction to improve w/c propulsion and allow for energy conservation. Pt able to perform for 2-3 bouts then returned to small shortened pushes with poor momentum. Pt transported remaining distance to rehab gym. Pt able to apply/disarm breaks and release leg rest/extender. Pt performed weaving through cones and performing u-turn with fair accuracy. Pt returned to room and agreeable to remain in w/c to eat lunch. Left with call bell within reach and wife present.    Therapy Documentation Precautions:  Precautions Precautions: Fall Precaution Comments: PD port, VAC left residual limb Restrictions Weight Bearing Restrictions: Yes LLE Weight Bearing: Non weight bearing General:   Vital Signs:  Pain: Pain Assessment Pain Assessment: 0-10 Pain Score: 2  Pain Type: Acute pain Pain Location: Leg Pain Orientation: Left         See Function Navigator for Current Functional Status.   Therapy/Group: Individual Therapy  Armond Cuthrell  Kayce Betty, PTA  01/18/2017, 12:56 PM

## 2017-01-18 NOTE — Progress Notes (Signed)
Occupational Therapy Session Note  Patient Details  Name: [name] MRN: [id number] Date of Birth: 02/16/1941  Today's Date: 01/18/2017 OT Individual Time: 1300-1325 OT Individual Time Calculation (min): 25 min    Short Term Goals: Week 1:  OT Short Term Goal 1 (Week 1): Pt will transfer to toilet with mod assist squat pivot vs slide board OT Short Term Goal 2 (Week 1): Pt will perform sit to stand with mod A in prep for clothing management OT Short Term Goal 3 (Week 1): Pt will don pants with mod assist with lateral leans OT Short Term Goal 4 (Week 1): Pt will navigate w/c around room to obtain clothing for ADL with min A   Skilled Therapeutic Interventions/Progress Updates:    1:1 Enaged in therapeutic exercise at EOB and then supine with fatigue.  Performed UB exercises with green theraband with focus on on large shoulder mm and bicep/ tricp - difficulty with full ROM with resistance due to fatigue.  Left resting in bed at end of session.   Therapy Documentation Precautions:  Precautions Precautions: Fall Precaution Comments: PD port, VAC left residual limb Restrictions Weight Bearing Restrictions: Yes LLE Weight Bearing: Non weight bearing   Pain:  no c/o pain just fatigue See Function Navigator for Current Functional Status.   Therapy/Group: Individual Therapy  Roney Mans [facility] 01/18/2017, 3:56 PM

## 2017-01-18 NOTE — Progress Notes (Addendum)
[facility] PHYSICAL MEDICINE & REHABILITATION     PROGRESS NOTE  Subjective/Complaints:  Pt seen laying in bed this AM.  Wife at bedside.  She states he slept well overnight.  Patient states he is going to try therapies. Wife with questions about d/cing Vac.   ROS: Denies CP, SOB, N/V/D.  Objective: Vital Signs: Blood pressure (!) 114/55, pulse 77, temperature 97.8 F (36.6 C), temperature source Axillary, resp. rate 18, height  (1.753 m), weight 90.2 kg (198 lb 14.2 oz), SpO2 99 %. No results found.  Recent Labs  01/17/17 1634 01/18/17 0624  WBC 12.8* 10.1  HGB 8.3* 7.8*  HCT 26.2* 25.4*  PLT 501* 462*    Recent Labs  01/16/17 0444 01/18/17 0624  NA 133* 129*  K 3.7 3.3*  CL 92* 90*  GLUCOSE 145* 373*  BUN 61* 53*  CREATININE 8.20* 7.61*  CALCIUM 8.5* 8.6*   CBG (last 3)   Recent Labs  01/17/17 1636 01/17/17 2123 01/18/17 0613  GLUCAP 229* 342* 385*    Wt Readings from Last 3 Encounters:  01/18/17 90.2 kg (198 lb 14.2 oz)  01/17/17 86.7 kg (191 lb 2.2 oz)  01/05/17 87.9 kg (193 lb 12.6 oz)    Physical Exam:  BP (!) 114/55 (BP Location: Right Wrist)   Pulse 77   Temp 97.8 F (36.6 C) (Axillary)   Resp 18   Ht  (1.753 m)   Wt 90.2 kg (198 lb 14.2 oz)   SpO2 99%   BMI 29.37 kg/m  Constitutional: He appears well-nourished. No distress. Obese. HENT: Normocephalic. Areas of abrasion healing.  Eyes: EOMI. No discharge.  Cardiovascular: Normal rateand regular rhythm. No JVD. Respiratory: Effort normaland breath sounds normal.  GI: Soft. Bowel sounds are normal.   Musculoskeletal: He exhibits edemaand tenderness Neurological: He is alert.  Motor: 4/5 grossly throughout. Skin:  Right fifth toe ulcer  Right groin distal dehiscence with yellow eschar.  Psychiatric: His speech is normal. He is withdrawn.   Assessment/Plan: 1. Functional deficits secondary to left BKA which require 3+ hours per day of interdisciplinary therapy in a  comprehensive inpatient rehab setting. Physiatrist is providing close team supervision and 24 hour management of active medical problems listed below. Physiatrist and rehab team continue to assess barriers to discharge/monitor patient progress toward functional and medical goals.  Function:  Bathing Bathing position      Bathing parts      Bathing assist        Upper Body Dressing/Undressing Upper body dressing                    Upper body assist        Lower Body Dressing/Undressing Lower body dressing                                  Lower body assist        Toileting Toileting          Toileting assist     Transfers Chair/bed transfer             Locomotion Ambulation           Wheelchair          Cognition Comprehension    Expression    Social Interaction    Problem Solving    Memory      Medical Problem List and Plan: 1. Functional deficitssecondary  to left BKA  Begin CIR  Notes reviewed 2. DVT Prophylaxis/Anticoagulation: Pharmaceutical: Heparin 3. Pain Management: Tylenol or Celebrex prn. Unable to tolerate muscle relaxers or pain medications due to SE of tremors, confusion and hallucinations.  4. Mood: LCSW to follow for evaluation and support.  5. Neuropsych: This patient is not fullycapable of making decisions on his own behalf. 6. Skin/Wound Care: Routine pressure relief measures. Prevalon boot for right foot. Monitor daily for intake to promote healing. Protein supplement and vitamins to promote wound healing.  7. Fluids/Electrolytes/Nutrition: Monitor I/O. Lytes/CBC per PD protocol.  8. Leucocytosis: WBC has fluctuated. Monitor for fevers and other sign of infection.   WBCs 10.1 on 10/10  Cont to monitor 9. Hypotension: Monitor BP bid---better on midodrine.   Monitor with increased mobility 10. ESRD on PD: Schedule PD in evening and early am--unhook by breakfast.   Discussed potential need to transition  to HD in future, family reluctant at this point 23. Anemia of chronic disease: On aranesp every week.   Hb 7.8 on 10/10  Cont to monitor 12. T2DM insulin dependent: Brittle diabetic who's hard to control in part due to inconsistent intake. Changed to Premier protein with meals.   Monitor with increased mobility 13. Constipation: Continue Colace bid with lactulose prn.  14. PVD with s/p left femoral pseudoaneurysm repair.   Will speak with VVS regarding RLE  Prevalon boot for RLE with pressure relief measures.  15. Delirium: Improved but continues to have occasional hallucinations per wife.    LOS (Days) 1 A FACE TO FACE EVALUATION WAS PERFORMED  Daimen Shovlin Karis Juba 01/18/2017 8:20 AM

## 2017-01-18 NOTE — Consult Note (Addendum)
WOC Nurse wound follow-up consult note Dr Lajoyce Corners following for assessment and plan of care to LLE  Reason for Consult: Reassessment of nose and right ear. Pt has been wearing Bipap and developed medical-device related pressure injuries which were present on admission to rehab. Wound type: Bridge of nose has decreased in size; .8X.8X.1cm, and previous deep tissue injury has evolved into an unstageable pressure injury; dark brown scabbed area.no odor or drainage. Right upper ear with previous dark red deep tissue injury has healed. Dressing procedure/placement/frequency: Discussed plan of care with patient and wife at the bedside. Gentamycin ointment has already been ordered to promote moist healing, can be covered by bandaid during the day to promote healing to bridge of nose;  foam dressing to protect and add padding to nose when patient is wearing Bipap mask at night. Instructions provided for staff nurses and wife. Please re-consult if further assistance is needed. Thank-you,  Cammie Mcgee MSN, RN, CWOCN, [place], CNS [phone]

## 2017-01-19 ENCOUNTER — Encounter (HOSPITAL_COMMUNITY): Payer: Self-pay | Admitting: Nurse Practitioner

## 2017-01-19 ENCOUNTER — Inpatient Hospital Stay (HOSPITAL_COMMUNITY): Payer: Medicare Other | Admitting: Occupational Therapy

## 2017-01-19 ENCOUNTER — Inpatient Hospital Stay (HOSPITAL_COMMUNITY): Payer: Medicare Other

## 2017-01-19 ENCOUNTER — Inpatient Hospital Stay (HOSPITAL_COMMUNITY): Payer: Medicare Other | Admitting: Physical Therapy

## 2017-01-19 ENCOUNTER — Other Ambulatory Visit: Payer: Self-pay | Admitting: *Deleted

## 2017-01-19 DIAGNOSIS — R0989 Other specified symptoms and signs involving the circulatory and respiratory systems: Secondary | ICD-10-CM

## 2017-01-19 DIAGNOSIS — E1142 Type 2 diabetes mellitus with diabetic polyneuropathy: Secondary | ICD-10-CM

## 2017-01-19 DIAGNOSIS — R7309 Other abnormal glucose: Secondary | ICD-10-CM

## 2017-01-19 DIAGNOSIS — E162 Hypoglycemia, unspecified: Secondary | ICD-10-CM | POA: Insufficient documentation

## 2017-01-19 DIAGNOSIS — I953 Hypotension of hemodialysis: Secondary | ICD-10-CM

## 2017-01-19 LAB — GLUCOSE, CAPILLARY
GLUCOSE-CAPILLARY: 98 mg/dL (ref 65–99)
Glucose-Capillary: 238 mg/dL — ABNORMAL HIGH (ref 65–99)
Glucose-Capillary: 154 mg/dL — ABNORMAL HIGH (ref 65–99)
Glucose-Capillary: 282 mg/dL — ABNORMAL HIGH (ref 65–99)

## 2017-01-19 MED ORDER — HEPARIN 1000 UNIT/ML FOR PERITONEAL DIALYSIS: 500.0000 [IU] | INTRAMUSCULAR | Status: DC | PRN

## 2017-01-19 MED ORDER — DEXTROSE 5 % IV SOLN
1.5000 g | INTRAVENOUS | Status: AC
  Administered 2017-01-20: 1.5 g via INTRAVENOUS

## 2017-01-19 MED ORDER — INSULIN NPH (HUMAN) (ISOPHANE) 100 UNIT/ML ~~LOC~~ SUSP
50.0000 [IU] | Freq: Every day | SUBCUTANEOUS | Status: DC
  Administered 2017-01-20: 50 [IU] via SUBCUTANEOUS

## 2017-01-19 MED ORDER — DELFLEX-LC/2.5% DEXTROSE 394 MOSM/L IP SOLN: INTRAPERITONEAL | Status: DC

## 2017-01-19 MED ORDER — INSULIN NPH (HUMAN) (ISOPHANE) 100 UNIT/ML ~~LOC~~ SUSP
25.0000 [IU] | Freq: Every day | SUBCUTANEOUS | Status: DC
  Administered 2017-01-21: 25 [IU] via SUBCUTANEOUS

## 2017-01-19 MED ORDER — GERHARDT'S BUTT CREAM
TOPICAL_CREAM | CUTANEOUS | Status: DC | PRN
  Filled 2017-01-19: qty 1

## 2017-01-19 MED ORDER — SODIUM CHLORIDE 0.9 % IV SOLN
INTRAVENOUS | Status: DC
  Administered 2017-01-20: 09:00:00 via INTRAVENOUS

## 2017-01-19 MED ORDER — CEFAZOLIN SODIUM-DEXTROSE 2-4 GM/100ML-% IV SOLN: 2.0000 g | INTRAVENOUS | Status: DC

## 2017-01-19 MED ORDER — DELFLEX-LC/2.5% DEXTROSE 394 MOSM/L IP SOLN
INTRAPERITONEAL | Status: DC
  Administered 2017-01-23 – 2017-01-31 (×3): via INTRAPERITONEAL

## 2017-01-19 MED ORDER — GENTAMICIN SULFATE 0.1 % EX CREA
1.0000 "application " | TOPICAL_CREAM | Freq: Every day | CUTANEOUS | Status: DC
  Administered 2017-01-19 – 2017-02-04 (×10): 1 via TOPICAL
  Filled 2017-01-19: qty 15

## 2017-01-19 MED ORDER — DARBEPOETIN ALFA 200 MCG/0.4ML IJ SOSY
200.0000 ug | PREFILLED_SYRINGE | INTRAMUSCULAR | Status: DC
  Administered 2017-01-19: 200 ug via SUBCUTANEOUS
  Filled 2017-01-19 (×2): qty 0.4

## 2017-01-19 MED ORDER — POTASSIUM CHLORIDE 20 MEQ PO PACK
20.0000 meq | PACK | Freq: Once | ORAL | Status: AC
  Administered 2017-01-19: 20 meq via ORAL
  Filled 2017-01-19: qty 1

## 2017-01-19 NOTE — Patient Care Conference (Signed)
Inpatient RehabilitationTeam Conference and Plan of Care Update Date: 01/18/2017   Time: 2:55 PM    Patient Name: [name]      Medical Record Number: [id number]  Date of Birth: Aug 12, 1940 Sex: Male         Room/Bed: 4W05C/4W05C-01 Payor Info: Payor: MEDICARE / Plan: MEDICARE PART A AND B / Product Type: *No Product type* /    Admitting Diagnosis: L BKA  Admit Date/Time:  01/17/2017  5:16 PM Admission Comments: No comment available   Primary Diagnosis:  Unilateral complete BKA, right, sequela (HCC) Principal Problem: Unilateral complete BKA, right, sequela [facility])  Patient Active Problem List   Diagnosis Date Noted  . Hypoglycemia   . Ischemic pain of right foot   . Arterial hypotension   . Diabetes mellitus type 2 in obese (HCC)   . PVD (peripheral vascular disease) (HCC)   . Delirium   . Unilateral complete BKA, right, sequela (HCC) 01/17/2017  . Acute blood loss anemia   . Elevated AST (SGOT) 01/09/2017  . Chest pain 01/09/2017  . NSTEMI (non-ST elevated myocardial infarction) (HCC) 01/09/2017  . Hypokalemia 01/07/2017  . Hypomagnesemia 01/07/2017  . Left foot infection 01/06/2017  . Chronic hypotension 01/06/2017  . 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 IDDM (insulin dependent diabetes mellitus) (HCC) 01/06/2017  . Foot infection 01/06/2017  . Labile blood pressure   . Hallucination   . Benign essential HTN   . Ulcer of toe of right foot (HCC)   . Ulcer of external ear, limited to breakdown of skin (HCC)   . Chronic congestive heart failure (HCC)   . Anemia of chronic disease   . Labile blood glucose   . 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 Diabetic peripheral neuropathy (HCC)   . Debility 12/27/2016  . History of transmetatarsal amputation of left foot (HCC) 12/27/2016  . Constipation   . Chronic obstructive pulmonary disease (HCC)   . Nausea   . ESRD on dialysis (HCC)   .  Leukocytosis   . Tachypnea   . FUO (fever of unknown origin)   . Encephalopathy 12/23/2016  . Sore throat 12/23/2016  . Gangrene of left foot (HCC) 11/26/2016  . PAD (peripheral artery disease) (HCC) 11/26/2016  . Sjogren's syndrome (HCC) 09/26/2015  . Uncontrolled type 2 diabetes mellitus with hyperglycemia, with long-term current use of insulin (HCC) 09/26/2015  . Hyperosmolar non-ketotic state in patient with type 2 diabetes mellitus (HCC) 09/25/2015  . Ileus (HCC) 09/22/2015  . Acute respiratory failure with hypoxia (HCC) 09/22/2015  . Acute gouty arthritis 09/22/2015  . CAD (coronary artery disease) of artery bypass graft 09/22/2015  . Sepsis (HCC) 09/21/2015  . Elevated troponin level 09/21/2015  . ESRD on peritoneal dialysis (HCC) 09/21/2015  . CVA (cerebral infarction) 09/21/2015    Expected Discharge Date: Expected Discharge Date:  (14-18 days)  Team Members Present: Physician leading conference: Dr. Maryla Morrow Social Worker Present: Amada Jupiter, LCSW Nurse Present: Kennon Portela, RN PT Present: [name] Plump, PT OT Present: Rosalio Loud, OT SLP Present: Jackalyn Lombard, SLP PPS Coordinator present : Tora Duck, RN, CRRN     Current Status/Progress Goal Weekly Team Focus  Medical   Functional deficits secondary to left BKA  Improve mobility, transfers, safety, wound, DM, leukocytosis, ESRD, RLE ulcer plan  See above   Bowel/Bladder   Continent of bowel and bladder; PD nightly; LBM 10/11- has periods of incontinence at night (gas related)  Mod  I assist  Assess and treat for constipation as needed   Swallow/Nutrition/ Hydration   Poor appetite         ADL's   total assist LB bathing/dressing, supervision UB bathing/dressing, +2 sit > stand for functional self-care tasks  supervision to min assist  ADL retraining, sit <> stand, transfers, strengthening, endurance   Mobility   min assist bed mobility, max assist squat pivot transfers  independent bed mobility, min assist  transfers, ambulation goal for PT only, supervision w/c mobility  transfers, bed mobility, endurance, education   Communication             Safety/Cognition/ Behavioral Observations            Pain   C/o intermittent pain in left stump and right heel, and bridge of nose; tylenol effective- cannot tolerate strong meds  < 4  Assess and treat for pain q shift and prn   Skin   Incision to stump-vac removed today- bleeding some; scab to bridge of nose- antibiotic cream and pressure management used; heels pink but blanchable; right 5th toe ischemic- to have amputation Friday; sacrum pink but blanchable but raw- gerhardts butt cream  mod assist  Assess skin q shift and prn; perform dressing changes as MD order    Rehab Goals Patient on target to meet rehab goals: Yes *See Care Plan and progress notes for long and short-term goals.     Barriers to Discharge  Current Status/Progress Possible Resolutions Date Resolved   Physician    Decreased caregiver support;Medical stability;Home environment access/layout;Wound Care;Lack of/limited family support;Other (comments)  VAC, PD  See above  Therapies, follow labs, d/c VAC when appropriate, Vasc consult      Nursing  Wound Care;Medical stability;Weight bearing restrictions               PT  Home environment access/layout  step to enter home              OT Home environment access/layout                SLP                SW                Discharge Planning/Teaching Needs:  Home with wife and family to provide 24/7 assistance.  Teaching to be planned closer to d/c.   Team Discussion:  New eval (but returning patient).  RLE ulcer appears to be worsening - vascular may consider amp of toe.  VAC on LE to be removed tomorrow.  Very poor strength and total assist. Hoping for min assist goals.  Revisions to Treatment Plan:  None    Continued Need for Acute Rehabilitation Level of Care: The patient requires daily medical management by a physician  with specialized training in physical medicine and rehabilitation for the following conditions: Daily direction of a multidisciplinary physical rehabilitation program to ensure safe treatment while eliciting the highest outcome that is of practical value to the patient.: Yes Daily medical management of patient stability for increased activity during participation in an intensive rehabilitation regime.: Yes Daily analysis of laboratory values and/or radiology reports with any subsequent need for medication adjustment of medical intervention for : Post surgical problems;Diabetes problems;Wound care problems;Blood pressure problems;Renal problems  Jereme Loren 01/19/2017, 3:22 PM

## 2017-01-19 NOTE — Progress Notes (Signed)
[place] PHYSICAL MEDICINE & REHABILITATION     PROGRESS NOTE  Subjective/Complaints:  Pt seen laying in bed this AM.  He slept well overnight.  He notes a tiring day in therapies yesterday.  He was seen by Vascular yesterday.    ROS: Denies CP, SOB, N/V/D.  Objective: Vital Signs: Blood pressure (!) 151/49, pulse 83, temperature 97.9 F (36.6 C), temperature source Oral, resp. rate 18, height  (1.753 m), weight 82.5 kg (181 lb 14.1 oz), SpO2 94 %. No results found.  Recent Labs  01/17/17 1634 01/18/17 0624  WBC 12.8* 10.1  HGB 8.3* 7.8*  HCT 26.2* 25.4*  PLT 501* 462*    Recent Labs  01/18/17 0624  NA 129*  K 3.3*  CL 90*  GLUCOSE 373*  BUN 53*  CREATININE 7.61*  CALCIUM 8.6*   CBG (last 3)   Recent Labs  01/18/17 1709 01/18/17 2111 01/19/17 0629  GLUCAP 64* 217* 238*    Wt Readings from Last 3 Encounters:  01/19/17 82.5 kg (181 lb 14.1 oz)  01/17/17 86.7 kg (191 lb 2.2 oz)  01/05/17 87.9 kg (193 lb 12.6 oz)    Physical Exam:  BP (!) 151/49 (BP Location: Right Wrist)   Pulse 83   Temp 97.9 F (36.6 C) (Oral)   Resp 18   Ht  (1.753 m)   Wt 82.5 kg (181 lb 14.1 oz)   SpO2 94%   BMI 26.86 kg/m  Constitutional: He appears well-nourished. No distress. Obese. HENT: Normocephalic. Areas of abrasion healing.  Eyes: EOMI. No discharge.  Cardiovascular: Normal rateand regular rhythm. No JVD. Respiratory: Effort normaland breath sounds normal.  GI: Soft. Bowel sounds are normal.   Musculoskeletal: He exhibits edemaand tenderness Neurological: He is alert.  Motor: 4/5 grossly throughout (stable) Skin:  Right fifth toe ulcer  +VAC  Psychiatric: His speech is normal. He is withdrawn.   Assessment/Plan: 1. Functional deficits secondary to left BKA which require 3+ hours per day of interdisciplinary therapy in a comprehensive inpatient rehab setting. Physiatrist is providing close team supervision and 24 hour management of active  medical problems listed below. Physiatrist and rehab team continue to assess barriers to discharge/monitor patient progress toward functional and medical goals.  Function:  Bathing Bathing position   Position: Bed (bed level for LB, seated in w/c for UB)  Bathing parts Body parts bathed by patient: Right arm, Left arm, Chest, Abdomen, Front perineal area, Left upper leg Body parts bathed by helper: Buttocks, Right upper leg, Left upper leg, Right lower leg, Back  Bathing assist Assist Level:  (Mod assist)      Upper Body Dressing/Undressing Upper body dressing   What is the patient wearing?: Pull over shirt/dress     Pull over shirt/dress - Perfomed by patient: Thread/unthread right sleeve, Thread/unthread left sleeve, Put head through opening Pull over shirt/dress - Perfomed by helper: Pull shirt over trunk        Upper body assist Assist Level:  (Min assist)      Lower Body Dressing/Undressing Lower body dressing   What is the patient wearing?: Pants, Non-skid slipper socks   Underwear - Performed by helper: Thread/unthread right underwear leg, Thread/unthread left underwear leg, Pull underwear up/down   Pants- Performed by helper: Thread/unthread right pants leg, Thread/unthread left pants leg, Pull pants up/down   Non-skid slipper socks- Performed by helper: Don/doff right sock  Lower body assist Assist for lower body dressing:  (Total assist)      Toileting Toileting     Toileting steps completed by helper: Adjust clothing prior to toileting, Performs perineal hygiene, Adjust clothing after toileting Toileting Assistive Devices: Prosthesis/orthosis, Grab bar or rail (stedy)  Toileting assist Assist level: Two helpers   Transfers Chair/bed transfer   Chair/bed transfer method: Squat pivot Chair/bed transfer assist level: 2 helpers Chair/bed transfer assistive device: Armrests     Locomotion Ambulation Ambulation activity did not occur:  Safety/medical concerns         Wheelchair   Type: Manual Max wheelchair distance: 20 ft Assist Level: Touching or steadying assistance (Pt > 75%)  Cognition Comprehension Comprehension assist level: Follows complex conversation/direction with extra time/assistive device  Expression Expression assist level: Expresses basic needs/ideas: With extra time/assistive device  Social Interaction Social Interaction assist level: Interacts appropriately with others with medication or extra time (anti-anxiety, antidepressant).  Problem Solving Problem solving assist level: Solves basic 90% of the time/requires cueing < 10% of the time  Memory Memory assist level: Complete Independence: No helper    Medical Problem List and Plan: 1. Functional deficitssecondary to left BKA  Cont CIR 2. DVT Prophylaxis/Anticoagulation: Pharmaceutical: Heparin 3. Pain Management: Tylenol or Celebrex prn. Unable to tolerate muscle relaxers or pain medications due to SE of tremors, confusion and hallucinations.  4. Mood: LCSW to follow for evaluation and support.  5. Neuropsych: This patient is not fullycapable of making decisions on his own behalf. 6. Skin/Wound Care: Routine pressure relief measures. Prevalon boot for right foot. Monitor daily for intake to promote healing. Protein supplement and vitamins to promote wound healing.  7. Fluids/Electrolytes/Nutrition: Monitor I/O. Lytes/CBC per PD protocol.  8. Leucocytosis: WBC has fluctuated. Monitor for fevers and other sign of infection.   WBCs 10.1 on 10/10  Cont to monitor 9. Hypotension: Monitor BP bid---better on midodrine.   Labile, but relatively controlled 10/11 10. ESRD on PD: Schedule PD in evening and early am--unhook by breakfast.   Discussed potential need to transition to HD in future, family reluctant at this point 81. Anemia of chronic disease: On aranesp every week.   Hb 7.8 on 10/10  Cont to monitor 12. T2DM insulin dependent: Brittle  diabetic who's hard to control in part due to inconsistent intake. Changed to Premier protein with meals.   Labile with hypoglycemia yesterday  AM NPH decreased to 30 units 13. Constipation: Continue Colace bid with lactulose prn.  14. PVD with s/p left femoral pseudoaneurysm repair.   Per Vasc, plan for toe amputation tomorrow  Prevalon boot for RLE with pressure relief measures.  15. Delirium: Improved but continues to have occasional hallucinations per wife.    LOS (Days) 2 A FACE TO FACE EVALUATION WAS PERFORMED  Braelen Sproule Karis Juba 01/19/2017 10:34 AM

## 2017-01-19 NOTE — Progress Notes (Signed)
Physical Therapy Note  Patient Details  Name: AMMIEL GUINEY MRN: [id number] Date of Birth: 01-17-1941 Today's Date: 01/19/2017    Time: 1448-1515 27 minutes  1:1 No c/o pain. Pt c/o fatigue but willing to participate with encouragement.  Pt performs w/c mobility x 35' with close supervision, increased time.  UBE 2 min fwd, 2 min bkwd with encouragement, level 1.  Squat pivot transfer to bed with max A, cues for wt shifts and technique.     Bonnie Overdorf 01/19/2017, 3:14 PM

## 2017-01-19 NOTE — Progress Notes (Signed)
Physical Therapy Note  Patient Details  Name: [name] MRN: [id number] Date of Birth: November 20, 1940 Today's Date: 01/19/2017  1300-1415, 75 min individual tx Pain: 5/10 L residual limb ; medicated during session  [name] propulsion using bil UEs x 50' with 3 rest breaks due to UE fatigue.  Therapeutic exercise performed with RLE to increase strength for functional mobility: on Kinetron from w/c, at level 50 x 20 cycles x 3 with rest breaks after each bout. Also seated in w/c, 10 x 1 each bil glut sets, L quad set, R long arc quad, bil hip adduction and internal rotation  Attempted slide board transfer to R/L onto firm mat, but pt began sliding forward on w/c cushion, requiring +2 assist to ensure safety.  Attempted A-P transfer with MaxiSlide under LEs, onto lowest mat, but pt unable to tolerate long sitting position of RLe.  Pt left resting in w/c with all needs within reach. Wife present  See function navigator for current status.  Iven Earnhart 01/19/2017, 7:56 AM

## 2017-01-19 NOTE — Progress Notes (Signed)
[facility] ROUNDING NOTE   Subjective:   Interval History:75 y.o.malewith hx of HTN, CVA, ESRD on PD, DM on insulin, COPD and CHFw AICD, hx CAD/ CABG 2011. Pt was admitted on 12/15/16 for non-healing L foot wound and R groin pseudoaneurysm, underwent repair of PA R groin and also resection of left 3rd metatarsal head by Dr Randie Heinz. However, the left foot progressed and developed gangrenous changes, so on 9/14 pt went for L transmetatarsal amputation by Dr Lajoyce Corners.He had a cardiac catheterization demonstrating no critical stenosis and cleared for surgery He continues on peritoneal dialysis(home = 5 2.5 L fills, all 2.5% dialysate, 10 min/1.5 hours/20 min fill/dwell/drain cycle with pause and daytime dwell; altered in the hospital for ease, doing fine) Some weakness still noted  Trying to participate with rehab  Objective:  Vital signs in last 24 hours:  Temp:  [97.7 F (36.5 C)-98.4 F (36.9 C)] 97.9 F (36.6 C) (10/11 0700) Pulse Rate:  [82-86] 83 (10/11 0700) Resp:  [18-20] 18 (10/11 0700) BP: (101-151)/(33-59) 151/49 (10/11 0700) SpO2:  [94 %-100 %] 94 % (10/11 0700) Weight:  [181 lb 14.1 oz (82.5 kg)-189 lb 9.5 oz (86 kg)] 181 lb 14.1 oz (82.5 kg) (10/11 0700)  Weight change: 2 lb 3.3 oz (1 kg) Filed Weights   01/18/17 1815 01/19/17 0533 01/19/17 0700  Weight: 189 lb 9.5 oz (86 kg) 188 lb 7.9 oz (85.5 kg) 181 lb 14.1 oz (82.5 kg)    Intake/Output: I/O last 3 completed shifts: In: 16109 [P.O.:1046; Other:24990] Out: 27360 [Other:27360]   Intake/Output this shift:  No intake/output data recorded.  CVS RRR Lungs diminished at bases otherwise clear anteriorly Abdomen +BS, soft and not tender. PD catheter left abd with dry dressings Trace -1+ RLE edema L TMA wrapped and some LLE edema Dialysis Access: PD cath, left upper arm AVF   Basic Metabolic Panel:  Recent Labs Lab 01/13/17 0238 01/14/17 0416 01/15/17 0212 01/16/17 0444 01/18/17 0624  NA 130* 130*  132* 133* 129*  K 3.7 3.6 3.3* 3.7 3.3*  CL 91* 92* 91* 92* 90*  CO2 GLUCOSE 360* 328* 348* 145* 373*  BUN 46* 49* 50* 61* 53*  CREATININE 6.94* 7.26* 7.30* 8.20* 7.61*  CALCIUM 7.9* 8.1* 8.5* 8.5* 8.6*  PHOS 5.4*  --   --  4.6  --     Liver Function Tests:  Recent Labs Lab 01/13/17 0238 01/14/17 0416 01/15/17 0212 01/16/17 0444 01/18/17 0624  AST 308* 185* 132*  --  43*  ALT 19 9* 7*  --  9*  ALKPHOS 61 58 64  --  70  BILITOT 0.5 0.5 0.4  --  0.4  PROT 5.4* 5.6* 5.8*  --  5.4*  ALBUMIN 1.3* 1.3* 1.3* 1.4* 1.2*   No results for input(s): LIPASE, AMYLASE in the last 168 hours. No results for input(s): AMMONIA in the last 168 hours.  CBC:  Recent Labs Lab 01/13/17 0238 01/15/17 0802 01/17/17 1634 01/18/17 0624  WBC 8.1 11.2* 12.8* 10.1  NEUTROABS  --   --   --  7.6  HGB 7.9* 7.7* 8.3* 7.8*  HCT 25.1* 24.5* 26.2* 25.4*  MCV 95.4 96.5 97.4 97.3  PLT 404* 436* 501* 462*    Cardiac Enzymes: No results for input(s): CKTOTAL, CKMB, CKMBINDEX, TROPONINI in the last 168 hours.  BNP: Invalid input(s): POCBNP  CBG:  Recent Labs Lab 01/18/17 0613 01/18/17 1136 01/18/17 1709 01/18/17 2111 01/19/17 0629  GLUCAP 385* 198*  64* 217* 238*    Microbiology: Results for orders placed or performed during the hospital encounter of 01/06/17  MRSA PCR Screening     Status: None   Collection Time: 01/08/17  4:15 PM  Result Value Ref Range Status   MRSA by PCR NEGATIVE NEGATIVE Final    Comment:        The GeneXpert MRSA Assay (FDA approved for NASAL specimens only), is one component of a comprehensive MRSA colonization surveillance program. It is not intended to diagnose MRSA infection nor to guide or monitor treatment for MRSA infections.   Surgical pcr screen     Status: None   Collection Time: 01/11/17  4:00 AM  Result Value Ref Range Status   MRSA, PCR NEGATIVE NEGATIVE Final   Staphylococcus aureus NEGATIVE NEGATIVE Final    Comment:  (NOTE) The Xpert SA Assay (FDA approved for NASAL specimens in patients 68 years of age and older), is one component of a comprehensive surveillance program. It is not intended to diagnose infection nor to guide or monitor treatment.     Coagulation Studies: No results for input(s): LABPROT, INR in the last 72 hours.  Urinalysis: No results for input(s): COLORURINE, LABSPEC, PHURINE, GLUCOSEU, HGBUR, BILIRUBINUR, KETONESUR, PROTEINUR, UROBILINOGEN, NITRITE, LEUKOCYTESUR in the last 72 hours.  Invalid input(s): APPERANCEUR    Imaging: No results found.   Medications:   . dialysis solution 2.5% low-MG/low-CA     . aspirin  81 mg Oral Daily  . atorvastatin  80 mg Oral q1800  . calcium acetate  667 mg Oral TID WC  . carvedilol  12.5 mg Oral QHS  . clopidogrel  75 mg Oral Q breakfast  . darbepoetin (ARANESP) injection - NON-DIALYSIS  200 mcg Subcutaneous Q Tue-1800  . docusate sodium  100 mg Oral BID  . feeding supplement (PRO-STAT SUGAR FREE 64)  30 mL Oral BID  . gentamicin cream  1 application Topical BID  . heparin subcutaneous  5,000 Units Subcutaneous Q8H  . insulin aspart  0-5 Units Subcutaneous QHS  . insulin aspart  0-9 Units Subcutaneous TID WC  . insulin NPH Human  40 Units Subcutaneous QAC breakfast   And  . insulin NPH Human  50 Units Subcutaneous QHS  . midodrine  5 mg Oral BID WC  . multivitamin  1 tablet Oral QHS  . protein supplement shake  2 oz Oral TID WC  . senna-docusate  1 tablet Oral QHS  . [START ON 02/04/2017] Vitamin D (Ergocalciferol)  50,000 Units Oral Q30 days   acetaminophen, aluminum hydroxide, bisacodyl, diphenhydrAMINE, Gerhardt's butt cream, guaiFENesin-dextromethorphan, dianeal solution for CAPD/CCPD with heparin, lactulose, nitroGLYCERIN, ondansetron **OR** ondansetron (ZOFRAN) IV, oxyCODONE, simethicone, sodium phosphate, traZODone  Assessment/ Plan:  1. PAD- s/p open repair of R common femoral artery pseudoaneurysm (VVS) and L transmet  12/23/16 (Dr. Lajoyce Corners). 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 Lajoyce Corners) 2. Chest pain/+ troponins. Prior CABG. Had stent to RCA 07/2016 d/t NSTEMI. Has ICD in place. Recurrent CP/+ trops. Cath 10/1 no intervention required.  3. ESRD- CCPD, using 2.5% dextrose at home; Altered regimen for ease in hospital (no pause or day bag) Had been using 1/2 and 1/2 past few days Using all 2.5 % bags for now BP controlled for now  4. Orthostatic hypotension - midodrine 5. Anemia (ABLA)- TSAT 45% and Ferritin 2284, ^Aranesp 200 qThurs. Not given yet this admission that I can see (ordered "with HD" which may be the issue). Hb 7.8 6. Constipation: miralax as needed 7.  Hypokalemia: Monitor for need for replacement. Give 30 po today.  8. Myoclonus / Confusion: stopped gabapentin and robaxin. Not confused past 2 days that I can see 9. Secondary HPT - phoslo. Regular diet to promote nutrition 10. DM - per primary team 11. Low grade temp- resolved    blood cultures neg- fluid cell count fine -  S/p BKA  12. Hypokalemia will replete     LOS: 2 Sterling Ucci W  :56 AM

## 2017-01-19 NOTE — Progress Notes (Signed)
Occupational Therapy Session Note  Patient Details  Name: [name] MRN: [id number] Date of Birth: 07/10/1940  Today's Date: 01/19/2017 OT Individual Time: 4098-1191 and 4782-9562 OT Individual Time Calculation (min): 56 min and 30 min   Short Term Goals: Week 1:  OT Short Term Goal 1 (Week 1): Pt will transfer to toilet with mod assist squat pivot vs slide board OT Short Term Goal 2 (Week 1): Pt will perform sit to stand with mod A in prep for clothing management OT Short Term Goal 3 (Week 1): Pt will don pants with mod assist with lateral leans OT Short Term Goal 4 (Week 1): Pt will navigate w/c around room to obtain clothing for ADL with min A   Skilled Therapeutic Interventions/Progress Updates:    1)Treatment session with focus on self-care retraining and functional transfers.  Pt received supine in bed reporting pain in buttocks.  Therapist completed hygiene with pt reporting mild relief with cool cloth on buttocks.  Applied barrier cream for relief and discussed with RN alternative methods for relief/protection.  Mod assist bed mobility this session due to pain in buttocks.  Therapist donned pants at bed level, encouraging pt to assist with management of BLE and rolling.  Vitals assess in sitting:111/46 with pt reporting mild dizziness which subsided with time.  Educated on positioning/EOB tolerance to increase BP with pt willing to attempt, however resistant d/t pain in buttocks.  Setup with ROHO cushion in w/c for additional pressure relief.  Pt completed UB bathing with setup and donned shirt, requiring assistance to pull shirt over trunk due to fatigue and difficulty with anterior weight shift.  Encouraged to sit OOB to increase OOB tolerance with pt and wife reporting understanding.  2) Treatment session with focus on w/c mobility and pain management.  Pt received upright in w/c reporting pain in residual limb due to removal of wound vac and change of dressing.  [name] mobility  with focus on problem solving and mobility to continue to assess ROHO cushion for pain management while addressing mobility.  Pt propelled ~35 feet with increased time and mod cues for sequencing and problem solving.  Pt reports extreme fatigue, therefore therapist assisted with w/c propulsion.  Returned to room and left seated upright in w/c with wife present.  Therapy Documentation Precautions:  Precautions Precautions: Fall Precaution Comments: PD port, VAC left residual limb Restrictions Weight Bearing Restrictions: Yes LLE Weight Bearing: Non weight bearing General:   Vital Signs: Therapy Vitals Temp: 97.9 F (36.6 C) Temp Source: Oral Pulse Rate: 83 Resp: 18 BP: (!) 151/49 Patient Position (if appropriate): Lying Oxygen Therapy SpO2: 94 % O2 Device: Not Delivered Pain: Pt with c/o pain in buttocks.  RN aware, therapist repositioned and provided with ROHO w/c cushion for pressure relief  See Function Navigator for Current Functional Status.   Therapy/Group: Individual Therapy  Kol Consuegra 01/19/2017, 10:00 AM

## 2017-01-19 NOTE — Progress Notes (Signed)
    Subjective  -   No acute events   Physical Exam:  Right 5th toe ulcer       Assessment/Plan:    We have agreed to proceed with right 5th toe amputation tomorrow.  He would like to return to rehab after the surgery, which would be fine with me if OK's by rehab  Shylo Zamor, Wells 01/19/2017 2:21 PM --  Vitals:   01/19/17 0533 01/19/17 0700  BP: (!) 101/33 (!) 151/49  Pulse: 82 83  Resp: 20 18  Temp: 98.4 F (36.9 C) 97.9 F (36.6 C)  SpO2: 98% 94%    Intake/Output Summary (Last 24 hours) at 01/19/17 1421 Last data filed at 01/19/17 0900  Gross per 24 hour  Intake            13417 ml  Output            13859 ml  Net             -442 ml     Laboratory CBC    Component Value Date/Time   WBC 10.1 01/18/2017 0624   HGB 7.8 (L) 01/18/2017 0624   HCT 25.4 (L) 01/18/2017 0624   PLT 462 (H) 01/18/2017 0624    BMET    Component Value Date/Time   NA 129 (L) 01/18/2017 0624   K 3.3 (L) 01/18/2017 0624   CL 90 (L) 01/18/2017 0624   CO2 27 01/18/2017 0624   GLUCOSE 373 (H) 01/18/2017 0624   BUN 53 (H) 01/18/2017 0624   CREATININE 7.61 (H) 01/18/2017 0624   CALCIUM 8.6 (L) 01/18/2017 0624   GFRNONAA 6 (L) 01/18/2017 0624   GFRAA 7 (L) 01/18/2017 0624    COAG Lab Results  Component Value Date   INR 1.20 01/09/2017   INR 1.22 09/21/2015   No results found for: PTT  Antibiotics Anti-infectives    None       V. Wells Giovannina Mun IV, M.D. [facility] [place] Office: [phone] Pager:  [phone] 

## 2017-01-20 ENCOUNTER — Ambulatory Visit (HOSPITAL_COMMUNITY): Admission: RE | Admit: 2017-01-20 | Payer: Medicare Other | Source: Ambulatory Visit | Admitting: Surgery

## 2017-01-20 ENCOUNTER — Encounter (HOSPITAL_COMMUNITY)
Admission: RE | Disposition: A | Discharge status: Home Health | Payer: Self-pay | Source: Intra-hospital | Attending: Physical Medicine & Rehabilitation

## 2017-01-20 ENCOUNTER — Inpatient Hospital Stay (HOSPITAL_COMMUNITY): Payer: Medicare Other | Admitting: Physical Therapy

## 2017-01-20 ENCOUNTER — Inpatient Hospital Stay (HOSPITAL_COMMUNITY): Payer: Medicare Other | Admitting: Occupational Therapy

## 2017-01-20 ENCOUNTER — Inpatient Hospital Stay (HOSPITAL_COMMUNITY): Payer: Medicare Other | Admitting: Certified Registered Nurse Anesthetist

## 2017-01-20 ENCOUNTER — Encounter (HOSPITAL_COMMUNITY): Payer: Self-pay

## 2017-01-20 DIAGNOSIS — I70245 Atherosclerosis of native arteries of left leg with ulceration of other part of foot: Secondary | ICD-10-CM

## 2017-01-20 HISTORY — PX: AMPUTATION: SHX166

## 2017-01-20 LAB — GLUCOSE, CAPILLARY
GLUCOSE-CAPILLARY: 291 mg/dL — AB (ref 65–99)
GLUCOSE-CAPILLARY: 207 mg/dL — AB (ref 65–99)
GLUCOSE-CAPILLARY: 162 mg/dL — AB (ref 65–99)
GLUCOSE-CAPILLARY: 150 mg/dL — AB (ref 65–99)
GLUCOSE-CAPILLARY: 383 mg/dL — AB (ref 65–99)
Glucose-Capillary: 183 mg/dL — ABNORMAL HIGH (ref 65–99)

## 2017-01-20 SURGERY — AMPUTATION DIGIT
Anesthesia: Monitor Anesthesia Care | Site: Foot | Laterality: Right

## 2017-01-20 MED ORDER — CEFUROXIME SODIUM 1.5 G IV SOLR
INTRAVENOUS | Status: AC
  Filled 2017-01-20: qty 1.5

## 2017-01-20 MED ORDER — PHENYLEPHRINE 40 MCG/ML (10ML) SYRINGE FOR IV PUSH (FOR BLOOD PRESSURE SUPPORT)
PREFILLED_SYRINGE | INTRAVENOUS | Status: DC | PRN
  Administered 2017-01-20: 80 ug via INTRAVENOUS

## 2017-01-20 MED ORDER — ROPIVACAINE HCL 5 MG/ML IJ SOLN
INTRAMUSCULAR | Status: DC | PRN
  Administered 2017-01-20: 150 mg via PERINEURAL

## 2017-01-20 MED ORDER — FENTANYL CITRATE (PF) 100 MCG/2ML IJ SOLN: 50.0000 ug | Freq: Once | INTRAMUSCULAR | Status: DC

## 2017-01-20 MED ORDER — FENTANYL CITRATE (PF) 250 MCG/5ML IJ SOLN
INTRAMUSCULAR | Status: AC
Start: 2017-01-20 — End: 2017-01-20
  Filled 2017-01-20: qty 5

## 2017-01-20 MED ORDER — PHENYLEPHRINE 40 MCG/ML (10ML) SYRINGE FOR IV PUSH (FOR BLOOD PRESSURE SUPPORT)
PREFILLED_SYRINGE | INTRAVENOUS | Status: AC
  Filled 2017-01-20: qty 10

## 2017-01-20 MED ORDER — FENTANYL CITRATE (PF) 100 MCG/2ML IJ SOLN
100.0000 ug | Freq: Once | INTRAMUSCULAR | Status: AC
  Administered 2017-01-20: 100 ug via INTRAVENOUS

## 2017-01-20 MED ORDER — FENTANYL CITRATE (PF) 100 MCG/2ML IJ SOLN
INTRAMUSCULAR | Status: DC | PRN
  Administered 2017-01-20: 50 ug via INTRAVENOUS

## 2017-01-20 MED ORDER — PROPOFOL 500 MG/50ML IV EMUL
INTRAVENOUS | Status: DC | PRN
  Administered 2017-01-20: 20 ug/kg/min via INTRAVENOUS

## 2017-01-20 MED ORDER — 0.9 % SODIUM CHLORIDE (POUR BTL) OPTIME
TOPICAL | Status: DC | PRN
  Administered 2017-01-20: 1000 mL

## 2017-01-20 MED ORDER — PROPOFOL 10 MG/ML IV BOLUS
INTRAVENOUS | Status: AC
  Filled 2017-01-20: qty 20

## 2017-01-20 MED ORDER — LIDOCAINE 2% (20 MG/ML) 5 ML SYRINGE
INTRAMUSCULAR | Status: AC
  Filled 2017-01-20: qty 5

## 2017-01-20 MED ORDER — LIDOCAINE HCL 1 % IJ SOLN
INTRAMUSCULAR | Status: AC
  Filled 2017-01-20: qty 20

## 2017-01-20 SURGICAL SUPPLY — 30 items
BANDAGE ACE 4X5 VEL STRL LF (GAUZE/BANDAGES/DRESSINGS) ×2 IMPLANT
BLADE AVERAGE 25X9 (BLADE) ×2 IMPLANT
BLADE SAW SGTL 81X20 HD (BLADE) IMPLANT
BNDG GAUZE ELAST 4 BULKY (GAUZE/BANDAGES/DRESSINGS) ×2 IMPLANT
CANISTER SUCT 3000ML PPV (MISCELLANEOUS) ×2 IMPLANT
COVER SURGICAL LIGHT HANDLE (MISCELLANEOUS) ×2 IMPLANT
DRAPE EXTREMITY T 121X128X90 (DRAPE) ×2 IMPLANT
DRAPE HALF SHEET 40X57 (DRAPES) ×2 IMPLANT
DRSG EMULSION OIL 3X3 NADH (GAUZE/BANDAGES/DRESSINGS) ×2 IMPLANT
ELECT REM PT RETURN 9FT ADLT (ELECTROSURGICAL) ×2
ELECTRODE REM PT RTRN 9FT ADLT (ELECTROSURGICAL) ×1 IMPLANT
GAUZE SPONGE 4X4 12PLY STRL (GAUZE/BANDAGES/DRESSINGS) ×2 IMPLANT
GLOVE BIOGEL PI IND STRL 7.5 (GLOVE) ×1 IMPLANT
GLOVE BIOGEL PI INDICATOR 7.5 (GLOVE) ×1
GLOVE SURG SS PI 7.5 STRL IVOR (GLOVE) ×2 IMPLANT
GOWN STRL REUS W/ TWL LRG LVL3 (GOWN DISPOSABLE) ×2 IMPLANT
GOWN STRL REUS W/ TWL XL LVL3 (GOWN DISPOSABLE) ×1 IMPLANT
GOWN STRL REUS W/TWL LRG LVL3 (GOWN DISPOSABLE) ×2
GOWN STRL REUS W/TWL XL LVL3 (GOWN DISPOSABLE) ×1
KIT BASIN OR (CUSTOM PROCEDURE TRAY) ×2 IMPLANT
KIT ROOM TURNOVER OR (KITS) ×2 IMPLANT
NEEDLE HYPO 25GX1X1/2 BEV (NEEDLE) IMPLANT
NS IRRIG 1000ML POUR BTL (IV SOLUTION) ×2 IMPLANT
PACK GENERAL/GYN (CUSTOM PROCEDURE TRAY) ×2 IMPLANT
PAD ARMBOARD 7.5X6 YLW CONV (MISCELLANEOUS) ×4 IMPLANT
SUT ETHILON 3 0 PS 1 (SUTURE) ×2 IMPLANT
SYR CONTROL 10ML LL (SYRINGE) IMPLANT
TOWEL GREEN STERILE (TOWEL DISPOSABLE) ×2 IMPLANT
UNDERPAD 30X30 (UNDERPADS AND DIAPERS) ×2 IMPLANT
WATER STERILE IRR 1000ML POUR (IV SOLUTION) ×2 IMPLANT

## 2017-01-20 NOTE — Progress Notes (Signed)
Social Work Patient ID: [name], male   DOB: 03/31/1941, 75 y.o.   MRN: [id number]   Met with pt and wife to review team conference.  Aware that team estimates LOS of 14-18 days given surgery done today.  Will follow up with team at beginning of the week to determine if estimate still realistic.  Continue to follow.  HOYLE, LUCY, LCSW  

## 2017-01-20 NOTE — H&P (View-Only) (Signed)
    Subjective  -   No acute events   Physical Exam:  Right 5th toe ulcer       Assessment/Plan:    We have agreed to proceed with right 5th toe amputation tomorrow.  He would like to return to rehab after the surgery, which would be fine with me if OK's by rehab  Brabham, Wells 01/19/2017 2:21 PM --  Vitals:   01/19/17 0533 01/19/17 0700  BP: (!) 101/33 (!) 151/49  Pulse: 82 83  Resp: 20 18  Temp: 98.4 F (36.9 C) 97.9 F (36.6 C)  SpO2: 98% 94%    Intake/Output Summary (Last 24 hours) at 01/19/17 1421 Last data filed at 01/19/17 0900  Gross per 24 hour  Intake            13417 ml  Output            13859 ml  Net             -442 ml     Laboratory CBC    Component Value Date/Time   WBC 10.1 01/18/2017 0624   HGB 7.8 (L) 01/18/2017 0624   HCT 25.4 (L) 01/18/2017 0624   PLT 462 (H) 01/18/2017 0624    BMET    Component Value Date/Time   NA 129 (L) 01/18/2017 0624   K 3.3 (L) 01/18/2017 0624   CL 90 (L) 01/18/2017 0624   CO2 27 01/18/2017 0624   GLUCOSE 373 (H) 01/18/2017 0624   BUN 53 (H) 01/18/2017 0624   CREATININE 7.61 (H) 01/18/2017 0624   CALCIUM 8.6 (L) 01/18/2017 0624   GFRNONAA 6 (L) 01/18/2017 0624   GFRAA 7 (L) 01/18/2017 0624    COAG Lab Results  Component Value Date   INR 1.20 01/09/2017   INR 1.22 09/21/2015   No results found for: PTT  Antibiotics Anti-infectives    None       V. Charlena Cross, M.D. [facility] [place] Office: [phone] Pager:  [phone]

## 2017-01-20 NOTE — Transfer of Care (Signed)
Immediate Anesthesia Transfer of Care Note  Patient: [name]  Procedure(s) Performed: AMPUTATION RIGHT FIFTH TOE (Right Foot)  Patient Location: PACU  Anesthesia Type:MAC and MAC combined with regional for post-op pain  Level of Consciousness: awake, alert , oriented and patient cooperative  Airway & Oxygen Therapy: Patient Spontanous Breathing and Patient connected to nasal cannula oxygen  Post-op Assessment: Report given to RN, Post -op Vital signs reviewed and stable and Patient moving all extremities X 4  Post vital signs: Reviewed and stable  Last Vitals:  Vitals:   01/20/17 0940 01/20/17 0945  BP: (!) 135/51 (!) 134/24  Pulse: 90 (!) 51  Resp: (!) 23 17  Temp:    SpO2: 100% 91%    Last Pain:  Vitals:   01/20/17 0935  TempSrc:   PainSc: 0-No pain      Patients Stated Pain Goal: 3 (74/82/70 7867)  Complications: No apparent anesthesia complications

## 2017-01-20 NOTE — Op Note (Signed)
    Patient name: [name] MRN: [id number] DOB: [birth year] Sex: male  01/17/2017 - 01/20/2017 Pre-operative Diagnosis: Infected right fifth toe Post-operative diagnosis:  Same Surgeon:  Durene Cal Assistants:  None Procedure:   Amputation right fifth toe including metatarsal head Anesthesia:  Ankle block Blood Loss:  See anesthesia record Specimens:  Right toe  Findings:  Moderate capillary bleeding.  Small localized abscess identified  Indications:  The patient has developed a new right fifth toe diabetic ulcer.  We deliberated on observation versus amputation.  His blood flow has been optimized and therefore we elected to proceed with amputation.  He understands the risk of a more proximal amputation at this does not heal.  Procedure:  The patient was identified in the holding area and taken to [facility] OR ROOM 11  The patient was then placed supine on the table. Ankle block anesthesia was administered.  The patient was prepped and draped in the usual sterile fashion.  A time out was called and antibiotics were administered.  A racquet-type incision was made at the base of the fifth toe.  This was done with a 10 blade and carried down to the bone.  Large bone cutters were used to transect the bone.  Then used Rogers to debride back to the metatarsal head which I resected.  There was a small abscess on the medial aspect of the toe.  This tissue was cleaned up sharply.  There was moderate capillary bleeding.  The wound was then irrigated.  It was reapproximated with interrupted 3-0 nylon suture.  Sterile dressing was applied.   Disposition:  To PACU stable   V. Durene Cal, M.D. [facility] [place] Office: [phone] Pager:  [phone]

## 2017-01-20 NOTE — Progress Notes (Signed)
Social Work Patient ID: [name], male   DOB: 1940-04-17, 76 y.o.   MRN: [id number]   Copy of initial assessment.  No changes needed per review with pt and wife.     [name], [place] Social Worker Signed   Progress Notes Date of Service: 12/30/2016 12:45 PM  Related encounter: Admission (Discharged) from 12/27/2016 in [facility] CENTER B       Hide copied text Hover for attribution information Social Work  Social Work Assessment and Plan  Patient Details  Name: [name] MRN: [id number] Date of Birth: 06/15/1940  Today's Date: 12/30/2016  Problem List:      Patient Active Problem List   Diagnosis Date Noted  . Chronic congestive heart failure (HCC)   . Anemia of chronic disease   . Labile blood glucose   . 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 Diabetic peripheral neuropathy (HCC)   . Debility 12/27/2016  . History of transmetatarsal amputation of left foot (HCC) 12/27/2016  . Constipation   . Chronic obstructive pulmonary disease (HCC)   . Nausea   . ESRD on dialysis (HCC)   . Leukocytosis   . Tachypnea   . FUO (fever of unknown origin)   . Encephalopathy 12/23/2016  . Sore throat 12/23/2016  . Gangrene of left foot (HCC) 11/26/2016  . PAD (peripheral artery disease) (HCC) 11/26/2016  . Sjogren's syndrome (HCC) 09/26/2015  . Uncontrolled type 2 diabetes mellitus with hyperglycemia, with long-term current use of insulin (HCC) 09/26/2015  . Hyperosmolar non-ketotic state in patient with type 2 diabetes mellitus (HCC) 09/25/2015  . Ileus (HCC) 09/22/2015  . Acute respiratory failure with hypoxia (HCC) 09/22/2015  . Acute gouty arthritis 09/22/2015  . CAD (coronary artery disease) of artery bypass graft 09/22/2015  . Sepsis (HCC) 09/21/2015  . Elevated troponin level 09/21/2015  . ESRD on peritoneal dialysis (HCC) 09/21/2015  . CVA (cerebral infarction) 09/21/2015    Past Medical History:      Past Medical History:  Diagnosis Date  . AICD (automatic cardioverter/defibrillator) present   . CHF (congestive heart failure) (HCC)   . COPD (chronic obstructive pulmonary disease) (HCC)   . Coronary artery disease    CABG 2011  . Diabetes mellitus without complication (HCC)   . ESRD on peritoneal dialysis [facility])    Started PD Dec 2016 in [place]. Nephrology is in [place].   [name] History of peritonitis    PD cath related peritonitis in April 2017  . Hypertension   . Peripheral vascular disease (HCC)   . Sjogren's syndrome (HCC)    Per pt's wife, was diagnosed in 2016 during w/u for cause of renal failure prior to starting dialysis.  He had a rash on his chest apparently prompting this w/u.  Never had renal bx.  He was referred to a rheum MD in [place] and per the wife he was treated with MTX and other medications but had side effects to "all of it" and isn't taking anything for this as of Jun 2017.   . Stroke [facility])    Past Surgical History:       Past Surgical History:  Procedure Laterality Date  . ABDOMINAL AORTOGRAM W/LOWER EXTREMITY N/A 11/29/2016   Procedure: ABDOMINAL AORTOGRAM W/LOWER EXTREMITY;  Surgeon: Nada Libman, MD;  [facility] INVASIVE CV LAB;  Service: Cardiovascular;  Laterality: N/A;  . AMPUTATION Left 11/29/2016   Procedure: LEFT THIRD TOE AMPUTATION;  Surgeon: Nada Libman, MD;  Location: [facility] OR;  Service: Vascular;  Laterality: Left;  . AMPUTATION Left 12/23/2016   Procedure: Left Transmetatarsal Amputation;  Surgeon: Nadara Mustard, MD;  Location: [facility] OR;  Service: Orthopedics;  Laterality: Left;  . ANGIOPLASTY  12/15/2016   Procedure: BALLOON ANGIOPLASTY;  Surgeon: Maeola Harman, MD;  Location: [facility] OR;  Service: Vascular;;  . cardiac catherization with stent placement  2017  . CORONARY ARTERY BYPASS GRAFT  2011  . FALSE ANEURYSM REPAIR Right 12/15/2016   Procedure: REPAIR FALSE ANEURYSM;   Surgeon: Maeola Harman, MD;  Location[facility] OR;  Service: Vascular;  Laterality: Right;  . IRRIGATION AND DEBRIDEMENT FOOT Left 12/15/2016   Procedure: IRRIGATION AND DEBRIDEMENT FOOT;  Surgeon: Maeola Harman, MD;  Location: [facility] OR;  Service: Vascular;  Laterality: Left;  . LOWER EXTREMITY ANGIOGRAM Left 12/15/2016   Procedure: LOWER EXTREMITY ANGIOGRAM; PEDAL ACCESS;  Surgeon: Maeola Harman, MD;  Location: [facility] OR;  Service: Vascular;  Laterality: Left;  . PERIPHERAL VASCULAR BALLOON ANGIOPLASTY  11/29/2016   Procedure: PERIPHERAL VASCULAR BALLOON ANGIOPLASTY;  Surgeon: Nada Libman, MD;  Location: [facility] INVASIVE CV LAB;  Service: Cardiovascular;;  LT Peroneal AT attempted unsuccessful   Social History:  reports that he has quit smoking. He has never used smokeless tobacco. He reports that he does not drink alcohol or use drugs.  Family / Support Systems Marital Status: Married Patient Roles: Spouse, Parent Spouse/Significant Other: wife, Dash Cardarelli @ (C) [phone] Children: adult son and daughter Anticipated Caregiver: wife Ability/Limitations of Caregiver: Wife does not work and can assist at home Caregiver Availability: 24/7 Family Dynamics: Wife here daily and very supportive.  She denies any concerns about providing assist at home.  Social History Preferred language: English Religion: [employer] Cultural Background: NA Read: Yes Write: Yes Employment Status: Retired Fish farm manager Issues: None Guardian/Conservator: None - per MD, pt is capable of making decisions on his own behalf   Abuse/Neglect Physical Abuse: Denies Verbal Abuse: Denies Sexual Abuse: Denies Exploitation of patient/patient's resources: Denies Self-Neglect: Denies  Emotional Status Pt's affect, behavior adn adjustment status: Pt pleasant, soft-spoken and completes assessment interview without difficulty.  He denies any emotional distress but will  monitor and refer for neuropsychology as indicated. Recent Psychosocial Issues: None Pyschiatric History: None Substance Abuse History: None  Patient / Family Perceptions, Expectations & Goals Pt/Family understanding of illness & functional limitations: Pt and wife with good understanding of medical issues/ course that resulted in need for amputation.  Good understanding of restrictions and limitations. Premorbid pt/family roles/activities: pt was independent overall but using rollator and transport w/c when needed. Anticipated changes in roles/activities/participation: dependent on gains - wife may need to provide more physical assistance Pt/family expectations/goals: "I just want to be able to get around as best as I can."  [employer]: None Premorbid Home Care/DME Agencies: Other (Comment) ([facility]) Transportation available at discharge: yes Resource referrals recommended: Support group (specify)  Discharge Planning Living Arrangements: Spouse/significant other Support Systems: Spouse/significant other, Other relatives, Friends/neighbors Type of Residence: Private residence Insurance Resources: [employer] Financial Resources: Social Security Financial Screen Referred: No Living Expenses: Own Money Management: Patient Does the patient have any problems obtaining your medications?: No Home Management: pt and iwfe Patient/Family Preliminary Plans: Pt to return home with wife providing 24/7 support Social Work Anticipated Follow Up Needs: HH/OP Expected length of stay: 14-17 days  Clinical Impression Very pleasant gentleman here following transmet amp. Wife  at bedside daily and very supportive.  Wife able to provide 24/7 assistance and both with good understanding of limitations and anticipated assist needs.  Will follow for support and d/c planning needs.  Woodrow Drab 12/30/2016, 12:45 PM

## 2017-01-20 NOTE — Anesthesia Preprocedure Evaluation (Signed)
Anesthesia Evaluation  Patient identified by MRN, date of birth, ID band Patient awake    Reviewed: Allergy & Precautions, NPO status , Patient's Chart, lab work & pertinent test results, reviewed documented beta blocker date and time   History of Anesthesia Complications Negative for: history of anesthetic complications  Airway Mallampati: IV  TM Distance: >3 FB Neck ROM: Full    Dental  (+) Chipped, Dental Advisory Given   Pulmonary sleep apnea (BiPAP) and Continuous Positive Airway Pressure Ventilation , COPD, former smoker,    breath sounds clear to auscultation       Cardiovascular hypertension, Pt. on medications and Pt. on home beta blockers + CAD, + CABG, + Peripheral Vascular Disease and +CHF  + Cardiac Defibrillator  Rhythm:Regular Rate:Normal  EKG 12/2016 - 1st deg AVB with PVCs, septal Q waves  10/14/16 ECHO [facility]): MODERATE LV DYSFUNCTION (See above) WITH MILD LVH NORMAL RIGHT VENTRICULAR SYSTOLIC FUNCTION VALVULAR REGURGITATION: TRIVIAL AR, TRIVIAL MR, TRIVIAL PR, TRIVIAL TR NO VALVULAR STENOSIS  AICD - [employer] EMBLEM S-ICD 45CM [phone] [employer] EMBLEM S-ICD MRI PULSE GENERATOR - [id number]   Neuro/Psych CVA, No Residual Symptoms    GI/Hepatic negative GI ROS, Neg liver ROS,   Endo/Other  diabetes, Insulin DependentSjogren's, obesity  Renal/GU Dialysis and ESRFRenal disease (peritoneal)     Musculoskeletal Sjogren's   Abdominal (+) + obese,   Peds  Hematology  (+) Blood dyscrasia (Hb 10.5), anemia , plavix   Anesthesia Other Findings   Reproductive/Obstetrics                             Anesthesia Physical  Anesthesia Plan  ASA: IV  Anesthesia Plan: MAC   Post-op Pain Management: GA combined w/ Regional for post-op pain   Induction:   PONV Risk Score and Plan: 2 and Ondansetron, Treatment may vary due to age or medical condition, Propofol infusion  and Dexamethasone  Airway Management Planned: Natural Airway  Additional Equipment: None  Intra-op Plan:   Post-operative Plan:   Informed Consent: I have reviewed the patients History and Physical, chart, labs and discussed the procedure including the risks, benefits and alternatives for the proposed anesthesia with the patient or authorized representative who has indicated his/her understanding and acceptance.   Dental advisory given  Plan Discussed with: CRNA  Anesthesia Plan Comments:         Anesthesia Quick Evaluation

## 2017-01-20 NOTE — Progress Notes (Signed)
[facility]ICINE [facility] REHABILITATION     PROGRESS NOTE  Subjective/Complaints:  Lying in bed. No new issues. Pain controlled. Waiting to go to OR  ROS: pt denies nausea, vomiting, diarrhea, cough, shortness of breath or chest pain   Objective: Vital Signs: Blood pressure (!) 106/55, pulse 89, temperature 97.8 F (36.6 C), temperature source Oral, resp. rate 18, height  (1.753 m), weight 92.1 kg (203 lb), SpO2 100 %. No results found.  Recent Labs  01/17/17 1634 01/18/17 0624  WBC 12.8* 10.1  HGB 8.3* 7.8*  HCT 26.2* 25.4*  PLT 501* 462*    Recent Labs  01/18/17 0624  NA 129*  K 3.3*  CL 90*  GLUCOSE 373*  BUN 53*  CREATININE 7.61*  CALCIUM 8.6*   CBG (last 3)   Recent Labs  01/19/17 1653 01/19/17 2041 01/20/17 0600  GLUCAP 98 282* 291*    Wt Readings from Last 3 Encounters:  01/20/17 92.1 kg (203 lb)  01/17/17 86.7 kg (191 lb 2.2 oz)  01/05/17 87.9 kg (193 lb 12.6 oz)    Physical Exam:  BP (!) 106/55 (BP Location: Right Wrist)   Pulse 89   Temp 97.8 F (36.6 C) (Oral)   Resp 18   Ht  (1.753 m)   Wt 92.1 kg (203 lb)   SpO2 100%   BMI 29.98 kg/m  Constitutional: He appears well-nourished. No distress. Obese. HENT: Normocephalic. Areas of abrasion healing.  Eyes: EOMI. No discharge.  Cardiovascular: RRR without murmur. No JVD . Respiratory: Effort normaland breath sounds normal.  GI: Soft. Bowel sounds are normal.   Musculoskeletal: He exhibits edemaand tenderness Neurological: He is alert.  Motor: 4/5 grossly throughout (stable) Skin:  Right fifth toe ulcer unchanged Left BKA incision clean dry intact Psychiatric: His speech is normal. He is withdrawn.   Assessment/Plan: 1. Functional deficits secondary to left BKA which require 3+ hours per day of interdisciplinary therapy in a comprehensive inpatient rehab setting. Physiatrist is providing close team supervision and 24 hour management of active medical problems  listed below. Physiatrist and rehab team continue to assess barriers to discharge/monitor patient progress toward functional and medical goals.  Function:  Bathing Bathing position   Position: Bed (bed level for LB, seated in w/c for UB)  Bathing parts Body parts bathed by patient: Right arm, Left arm, Chest, Abdomen, Front perineal area, Left upper leg Body parts bathed by helper: Buttocks, Right upper leg, Left upper leg, Right lower leg, Back  Bathing assist Assist Level:  (Mod assist)      Upper Body Dressing/Undressing Upper body dressing   What is the patient wearing?: Pull over shirt/dress     Pull over shirt/dress - Perfomed by patient: Thread/unthread right sleeve, Thread/unthread left sleeve, Put head through opening Pull over shirt/dress - Perfomed by helper: Pull shirt over trunk        Upper body assist Assist Level:  (Min assist)      Lower Body Dressing/Undressing Lower body dressing   What is the patient wearing?: Pants, Non-skid slipper socks   Underwear - Performed by helper: Thread/unthread right underwear leg, Thread/unthread left underwear leg, Pull underwear up/down   Pants- Performed by helper: Thread/unthread right pants leg, Thread/unthread left pants leg, Pull pants up/down   Non-skid slipper socks- Performed by helper: Don/doff right sock                  Lower body assist Assist for lower body dressing:  (Total  assist)      Toileting Toileting     Toileting steps completed by helper: Adjust clothing prior to toileting, Performs perineal hygiene, Adjust clothing after toileting Toileting Assistive Devices: Prosthesis/orthosis, Grab bar or rail (stedy)  Toileting assist Assist level: Two helpers   Transfers Chair/bed transfer   Chair/bed transfer method: Squat pivot Chair/bed transfer assist level: 2 helpers Chair/bed transfer assistive device: Armrests     Locomotion Ambulation Ambulation activity did not occur: Safety/medical  concerns         Wheelchair   Type: Manual Max wheelchair distance: 30 Assist Level: Touching or steadying assistance (Pt > 75%)  Cognition Comprehension Comprehension assist level: Follows complex conversation/direction with extra time/assistive device  Expression Expression assist level: Expresses basic needs/ideas: With extra time/assistive device  Social Interaction Social Interaction assist level: Interacts appropriately with others with medication or extra time (anti-anxiety, antidepressant).  Problem Solving Problem solving assist level: Solves basic 90% of the time/requires cueing < 10% of the time  Memory Memory assist level: Complete Independence: No helper    Medical Problem List and Plan: 1. Functional deficitssecondary to left BKA  -therapies on hold this morning for right 5th toe amp today 2. DVT Prophylaxis/Anticoagulation: Pharmaceutical: Heparin 3. Pain Management: Tylenol or Celebrex prn. Unable to tolerate muscle relaxers or pain medications due to SE of tremors, confusion and hallucinations.  4. Mood: LCSW to follow for evaluation and support.  5. Neuropsych: This patient is not fullycapable of making decisions on his own behalf. 6. Skin/Wound Care: Routine pressure relief measures. Prevalon boot for right foot. Monitor daily for intake to promote healing. Protein supplement and vitamins to promote wound healing.  7. Fluids/Electrolytes/Nutrition: Monitor I/O. Lytes/CBC per PD protocol.  8. Leucocytosis: WBC has fluctuated. Monitor for fevers and other sign of infection.   WBCs 10.1 on 10/10  Cont to monitor 9. Hypotension: Monitor BP bid---better on midodrine.   Improved control 10. ESRD on PD: Schedule PD in evening and early am--unhook by breakfast.   Discussed potential need to transition to HD in future, family reluctant at this point 101. Anemia of chronic disease: On aranesp every week.   Hb 7.8 on 10/10  Cont to monitor 12. T2DM insulin dependent:  Brittle diabetic who's hard to control in part due to inconsistent intake. Changed to Premier protein with meals.   Labile sugars persist.     NPH adjusted to 25u qam and 50u qpm yesterday---follow for pattern 13. Constipation: Continue Colace bid with lactulose prn.  14. PVD with s/p left femoral pseudoaneurysm repair.   Prevalon boot for RLE with pressure relief measures.  15. Delirium: Improved but continues to have occasional hallucinations per wife.    LOS (Days) 3 A FACE TO FACE EVALUATION WAS PERFORMED  Chanda Laperle T 01/20/2017 8:40 AM

## 2017-01-20 NOTE — Care Management Note (Signed)
Inpatient Rehabilitation Center Individual Statement of Services  Patient Name:  [name]  Date:  01/20/2017  Welcome to the Inpatient Rehabilitation Center.  Our goal is to provide you with an individualized program based on your diagnosis and situation, designed to meet your specific needs.  With this comprehensive rehabilitation program, you will be expected to participate in at least 3 hours of rehabilitation therapies Monday-Friday, with modified therapy programming on the weekends.  Your rehabilitation program will include the following services:  Physical Therapy (PT), Occupational Therapy (OT), 24 hour per day rehabilitation nursing, Therapeutic Recreaction (TR), Neuropsychology, Case Management (Social Worker), Rehabilitation Medicine, Nutrition Services and Pharmacy Services  Weekly team conferences will be held on Wednesdays to discuss your progress.  Your Social Worker will talk with you frequently to get your input and to update you on team discussions.  Team conferences with you and your family in attendance may also be held.  Expected length of stay: 14-18 days  Overall anticipated outcome: minimal assistance  Depending on your progress and recovery, your program may change. Your Social Worker will coordinate services and will keep you informed of any changes. Your Social Worker's name and contact numbers are listed  below.  The following services may also be recommended but are not provided by the Inpatient Rehabilitation Center:   Driving Evaluations  Home Health Rehabiltiation Services  Outpatient Rehabilitation Services    Arrangements will be made to provide these services after discharge if needed.  Arrangements include referral to agencies that provide these services.  Your insurance has been verified to be:  Medicare and BCBS Your primary doctor is:  Dr. Levell July  Pertinent information will be shared with your doctor and your insurance  company.  Social Worker:  [place], [place] [phone] or ([phone]   Information discussed with and copy given to patient by: Amada Jupiter, 01/20/2017, 2:25 PM

## 2017-01-20 NOTE — Anesthesia Procedure Notes (Signed)
Anesthesia Regional Block: Popliteal block   Pre-Anesthetic Checklist: ,, timeout performed, Correct Patient, Correct Site, Correct Laterality, Correct Procedure, Correct Position, site marked, Risks and benefits discussed,  Surgical consent,  Pre-op evaluation,  At surgeon's request and post-op pain management  Laterality: Right  Prep: chloraprep       Needles:  Injection technique: Single-shot  Needle Type: Echogenic Stimulator Needle          Additional Needles:   Narrative:  Start time: 01/20/2017 9:38 AM End time: 01/20/2017 9:46 AM Injection made incrementally with aspirations every 5 mL.  Performed by: Personally  Anesthesiologist: Heather Roberts  Additional Notes: A functioning IV was confirmed and monitors were applied.  Sterile prep and drape, hand hygiene and sterile gloves were used.  Negative aspiration and test dose prior to incremental administration of local anesthetic. The patient tolerated the procedure well.Ultrasound  guidance: relevant anatomy identified, needle position confirmed, local anesthetic spread visualized around nerve(s), vascular puncture avoided.  Image printed for medical record.

## 2017-01-20 NOTE — Interval H&P Note (Signed)
History and Physical Interval Note:  01/20/2017 10:15 AM  [name]  has presented today for surgery, with the diagnosis of Nonviable tissue Right Fifth Toe  The various methods of treatment have been discussed with the patient and family. After consideration of risks, benefits and other options for treatment, the patient has consented to  Procedure(s): AMPUTATION RIGHT FIFTH TOE (Right) as a surgical intervention .  The patient's history has been reviewed, patient examined, no change in status, stable for surgery.  I have reviewed the patient's chart and labs.  Questions were answered to the patient's satisfaction.     Durene Cal

## 2017-01-20 NOTE — Progress Notes (Signed)
[facility] ROUNDING NOTE   Subjective:   Interval History:76 y.o.malewith hx of HTN, CVA, ESRD on PD, DM on insulin, COPD and CHFw AICD, hx CAD/ CABG 2011. Pt was admitted on 12/15/16 for non-healing L foot wound and R groin pseudoaneurysm, underwent repair of PA R groin and also resection of left 3rd metatarsal head by Dr Randie Heinz. However, the left foot progressed and developed gangrenous changes, so on 9/14 pt went for L transmetatarsal amputation by Dr Lajoyce Corners.He had a cardiac catheterization demonstrating no critical stenosis and cleared for surgery He continues on peritoneal dialysis(home = 5 2.5 L fills, all 2.5% dialysate, 10 min/1.5 hours/20 min fill/dwell/drain cycle with pause and daytime dwell; altered in the hospital for ease, doing fine) Some weakness still noted  Trying to participate with rehab  Objective:  Vital signs in last 24 hours:  Temp:  [97.6 F (36.4 C)-98 F (36.7 C)] 97.8 F (36.6 C) (10/12 0818) Pulse Rate:  [76-90] 89 (10/12 0818) Resp:  [18] 18 (10/12 0818) BP: (106-138)/(39-55) 106/55 (10/12 0818) SpO2:  [98 %-100 %] 100 % (10/12 0818) Weight:  [203 lb (92.1 kg)] 203 lb (92.1 kg) (10/12 0818)  Weight change:  Filed Weights   01/19/17 0615 01/19/17 0700 01/20/17 0818  Weight: 202 lb 13.2 oz (92 kg) 181 lb 14.1 oz (82.5 kg) 203 lb (92.1 kg)    Intake/Output: I/O last 3 completed shifts: In: 16109 [P.O.:504; Other:12495] Out: 60454 [Urine:100; Other:13859]   Intake/Output this shift:  Total I/O In: 09811 [Other:12495] Out: 13774 [Other:13774]  CVS RRR Lungs diminished at bases otherwise clear anteriorly Abdomen +BS, soft and not tender. PD catheter left abd with dry dressings Trace -1+ RLE edema L TMA wrapped and some LLE edema Dialysis Access: PD cath, left upper arm AVF   Basic Metabolic Panel:  Recent Labs Lab 01/14/17 0416 01/15/17 0212 01/16/17 0444 01/18/17 0624  NA 130* 132* 133* 129*  K 3.6 3.3* 3.7 3.3*  CL 92*  91* 92* 90*  CO2 GLUCOSE 328* 348* 145* 373*  BUN 49* 50* 61* 53*  CREATININE 7.26* 7.30* 8.20* 7.61*  CALCIUM 8.1* 8.5* 8.5* 8.6*  PHOS  --   --  4.6  --     Liver Function Tests:  Recent Labs Lab 01/14/17 0416 01/15/17 0212 01/16/17 0444 01/18/17 0624  AST 185* 132*  --  43*  ALT 9* 7*  --  9*  ALKPHOS 58 64  --  70  BILITOT 0.5 0.4  --  0.4  PROT 5.6* 5.8*  --  5.4*  ALBUMIN 1.3* 1.3* 1.4* 1.2*   No results for input(s): LIPASE, AMYLASE in the last 168 hours. No results for input(s): AMMONIA in the last 168 hours.  CBC:  Recent Labs Lab 01/15/17 0802 01/17/17 1634 01/18/17 0624  WBC 11.2* 12.8* 10.1  NEUTROABS  --   --  7.6  HGB 7.7* 8.3* 7.8*  HCT 24.5* 26.2* 25.4*  MCV 96.5 97.4 97.3  PLT 436* 501* 462*    Cardiac Enzymes: No results for input(s): CKTOTAL, CKMB, CKMBINDEX, TROPONINI in the last 168 hours.  BNP: Invalid input(s): POCBNP  CBG:  Recent Labs Lab 01/19/17 1140 01/19/17 1653 01/19/17 2041 01/20/17 0600 01/20/17 0935  GLUCAP 154* 98 282* 291* 207*    Microbiology: Results for orders placed or performed during the hospital encounter of 01/06/17  MRSA PCR Screening     Status: None   Collection Time: 01/08/17  4:15 PM  Result Value  Ref Range Status   MRSA by PCR NEGATIVE NEGATIVE Final    Comment:        The GeneXpert MRSA Assay (FDA approved for NASAL specimens only), is one component of a comprehensive MRSA colonization surveillance program. It is not intended to diagnose MRSA infection nor to guide or monitor treatment for MRSA infections.   Surgical pcr screen     Status: None   Collection Time: 01/11/17  4:00 AM  Result Value Ref Range Status   MRSA, PCR NEGATIVE NEGATIVE Final   Staphylococcus aureus NEGATIVE NEGATIVE Final    Comment: (NOTE) The Xpert SA Assay (FDA approved for NASAL specimens in patients 68 years of age and older), is one component of a comprehensive surveillance program. It is not  intended to diagnose infection nor to guide or monitor treatment.     Coagulation Studies: No results for input(s): LABPROT, INR in the last 72 hours.  Urinalysis: No results for input(s): COLORURINE, LABSPEC, PHURINE, GLUCOSEU, HGBUR, BILIRUBINUR, KETONESUR, PROTEINUR, UROBILINOGEN, NITRITE, LEUKOCYTESUR in the last 72 hours.  Invalid input(s): APPERANCEUR    Imaging: No results found.   Medications:   . sodium chloride 10 mL/hr at 01/20/17 0921  . cefUROXime (ZINACEF)  IV    . cefUROXime (ZINACEF) 1.5 GM IVPB    . [MAR Hold] dialysis solution 2.5% low-MG/low-CA    . [MAR Hold] dialysis solution 2.5% low-MG/low-CA     . [MAR Hold] aspirin  81 mg Oral Daily  . [MAR Hold] atorvastatin  80 mg Oral q1800  . [MAR Hold] calcium acetate  667 mg Oral TID WC  . [MAR Hold] carvedilol  12.5 mg Oral QHS  . [MAR Hold] clopidogrel  75 mg Oral Q breakfast  . [MAR Hold] darbepoetin (ARANESP) injection - NON-DIALYSIS  200 mcg Subcutaneous Weekly  . [MAR Hold] docusate sodium  100 mg Oral BID  . [MAR Hold] feeding supplement (PRO-STAT SUGAR FREE 64)  30 mL Oral BID  . fentaNYL (SUBLIMAZE) injection  50 mcg Intravenous Once  . [MAR Hold] gentamicin cream  1 application Topical BID  . [MAR Hold] gentamicin cream  1 application Topical Daily  . [MAR Hold] heparin subcutaneous  5,000 Units Subcutaneous Q8H  . [MAR Hold] insulin aspart  0-5 Units Subcutaneous QHS  . [MAR Hold] insulin aspart  0-9 Units Subcutaneous TID WC  . [MAR Hold] insulin NPH Human  25 Units Subcutaneous QAC breakfast   And  . [MAR Hold] insulin NPH Human  50 Units Subcutaneous QHS  . [MAR Hold] midodrine  5 mg Oral BID WC  . [MAR Hold] multivitamin  1 tablet Oral QHS  . [MAR Hold] protein supplement shake  2 oz Oral TID WC  . [MAR Hold] senna-docusate  1 tablet Oral QHS  . [MAR Hold] Vitamin D (Ergocalciferol)  50,000 Units Oral Q30 days   0.9 % irrigation (POUR BTL), [MAR Hold] acetaminophen, [MAR Hold] aluminum  hydroxide, [MAR Hold] bisacodyl, [MAR Hold] diphenhydrAMINE, [MAR Hold] Gerhardt's butt cream, [MAR Hold] guaiFENesin-dextromethorphan, [MAR Hold] dianeal solution for CAPD/CCPD with heparin, [MAR Hold] lactulose, [MAR Hold] nitroGLYCERIN, [MAR Hold] ondansetron **OR** [MAR Hold] ondansetron (ZOFRAN) IV, [MAR Hold] oxyCODONE, [MAR Hold] simethicone, [MAR Hold] sodium phosphate, [MAR Hold] traZODone  Assessment/ Plan:  1. PAD- s/p open repair of R common femoral artery pseudoaneurysm (VVS) and L transmet 12/23/16 (Dr. Lajoyce Corners). 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 Lajoyce Corners) 2. Chest pain/+ troponins. Prior CABG. Had stent to RCA 07/2016 d/t NSTEMI. Has ICD in place. Recurrent CP/+  trops. Cath 10/1 no intervention required.  3. ESRD- CCPD, using 2.5% dextrose at home; Altered regimen for ease in hospital (no pause or day bag) Had been using 1/2 and 1/2 past few days Using all 2.5 % bags for now BP controlled for now  4. Orthostatic hypotension - midodrine 5. Anemia (ABLA)- TSAT 45% and Ferritin 2284, ^Aranesp 200 qThurs. Not given yet this admission that I can see (ordered "with HD" which may be the issue). Hb 7.8 6. Constipation: miralax as needed 7. Hypokalemia: Monitor for need for replacement. Give 30 po today.  8. Myoclonus / Confusion: stopped gabapentin and robaxin. Not confused past 2 days that I can see 9. Secondary HPT - phoslo. Regular diet to promote nutrition 10. DM - per primary team 11. Low grade temp- resolved blood cultures neg- fluid cell count fine - S/p BKA  12. Hypokalemia will replete last labs 10/10        LOS: 3 Lilliann Rossetti W  :48 AM

## 2017-01-21 ENCOUNTER — Inpatient Hospital Stay (HOSPITAL_COMMUNITY): Payer: Medicare Other | Admitting: Physical Therapy

## 2017-01-21 ENCOUNTER — Inpatient Hospital Stay (HOSPITAL_COMMUNITY): Payer: Medicare Other | Admitting: Occupational Therapy

## 2017-01-21 LAB — GLUCOSE, CAPILLARY
GLUCOSE-CAPILLARY: 333 mg/dL — AB (ref 65–99)
GLUCOSE-CAPILLARY: 269 mg/dL — AB (ref 65–99)
Glucose-Capillary: 274 mg/dL — ABNORMAL HIGH (ref 65–99)
Glucose-Capillary: 124 mg/dL — ABNORMAL HIGH (ref 65–99)

## 2017-01-21 MED ORDER — INSULIN NPH (HUMAN) (ISOPHANE) 100 UNIT/ML ~~LOC~~ SUSP
25.0000 [IU] | Freq: Every day | SUBCUTANEOUS | Status: DC
  Administered 2017-01-22: 25 [IU] via SUBCUTANEOUS

## 2017-01-21 MED ORDER — DARBEPOETIN ALFA 200 MCG/0.4ML IJ SOSY
200.0000 ug | PREFILLED_SYRINGE | INTRAMUSCULAR | Status: DC
  Administered 2017-01-26 – 2017-02-02 (×2): 200 ug via SUBCUTANEOUS
  Filled 2017-01-21 (×4): qty 0.4

## 2017-01-21 MED ORDER — INSULIN NPH (HUMAN) (ISOPHANE) 100 UNIT/ML ~~LOC~~ SUSP
55.0000 [IU] | Freq: Every day | SUBCUTANEOUS | Status: DC
  Administered 2017-01-21: 55 [IU] via SUBCUTANEOUS

## 2017-01-21 NOTE — Progress Notes (Signed)
Pt resting in bed quietly. Easily aroused. C/o stump and foot pain. Pain manangement administered per order and effective. Right groin that is well approximated and with a small amount of yellow tissue noted inferiorly is cleansed with wound cleanser and dry gauze is tucked under fold per order this am. Foam was placed on nasal bridge and pt has removed this morning per preference. Scab noted appears to begin to "lip" up on the right lateral side. Gentamycin ointment is applied per order this shift. Wife continues at bedside and is supportive to pt. Safety maintained. callbell within reach. Will continue to monitor.

## 2017-01-21 NOTE — Progress Notes (Addendum)
[facility]     PROGRESS NOTE  Subjective/Complaints:   Per RN labile CBGs  ROS: pt denies nausea, vomiting, diarrhea, cough, shortness of breath or chest pain   Objective: Vital Signs: Blood pressure (!) 135/41, pulse 74, temperature 98 F (36.7 C), temperature source Axillary, resp. rate 16, height  (1.753 m), weight 92.1 kg (203 lb), SpO2 100 %. No results found. No results for input(s): WBC, HGB, HCT, PLT in the last 72 hours. No results for input(s): NA, K, CL, GLUCOSE, BUN, CREATININE, CALCIUM in the last 72 hours.  Invalid input(s): CO CBG (last 3)   Recent Labs  01/20/17 1540 01/20/17 2117 01/21/17 0605  GLUCAP 150* 383* 333*    Wt Readings from Last 3 Encounters:  01/21/17 92.1 kg (203 lb)  01/17/17 86.7 kg (191 lb 2.2 oz)  01/05/17 87.9 kg (193 lb 12.6 oz)    Physical Exam:  BP (!) 135/41 (BP Location: Right Wrist)   Pulse 74   Temp 98 F (36.7 C) (Axillary)   Resp 16   Ht  (1.753 m)   Wt 92.1 kg (203 lb)   SpO2 100%   BMI 29.98 kg/m  Constitutional: He appears well-nourished. No distress. Obese. HENT: Normocephalic. Areas of abrasion healing.  Eyes: EOMI. No discharge.  Cardiovascular: RRR without murmur. No JVD . Respiratory: Effort normaland breath sounds normal.  GI: Soft. Bowel sounds are normal.   Musculoskeletal: He exhibits edemaand tenderness Neurological: He is alert.  Motor: 4/5 grossly throughout (stable) Skin:  Right fifth toe ulcer unchanged Left BKA incision clean dry intact Psychiatric: His speech is normal. He is withdrawn.   Assessment/Plan: 1. Functional deficits secondary to left BKA which require 3+ hours per day of interdisciplinary therapy in a comprehensive inpatient rehab setting. Physiatrist is providing close team supervision and 24 hour management of active medical problems listed below. Physiatrist and rehab team continue to assess barriers to discharge/monitor patient  progress toward functional and medical goals.  Function:  Bathing Bathing position   Position: Bed (bed level for LB, seated in w/c for UB)  Bathing parts Body parts bathed by patient: Right arm, Left arm, Chest, Abdomen, Front perineal area, Left upper leg Body parts bathed by helper: Buttocks, Right upper leg, Left upper leg, Right lower leg, Back  Bathing assist Assist Level:  (Mod assist)      Upper Body Dressing/Undressing Upper body dressing   What is the patient wearing?: Pull over shirt/dress     Pull over shirt/dress - Perfomed by patient: Thread/unthread right sleeve, Thread/unthread left sleeve, Put head through opening Pull over shirt/dress - Perfomed by helper: Pull shirt over trunk        Upper body assist Assist Level:  (Min assist)      Lower Body Dressing/Undressing Lower body dressing   What is the patient wearing?: Pants, Non-skid slipper socks   Underwear - Performed by helper: Thread/unthread right underwear leg, Thread/unthread left underwear leg, Pull underwear up/down   Pants- Performed by helper: Thread/unthread right pants leg, Thread/unthread left pants leg, Pull pants up/down   Non-skid slipper socks- Performed by helper: Don/doff right sock                  Lower body assist Assist for lower body dressing:  (Total assist)      Toileting Toileting Toileting activity did not occur: Safety/medical concerns   Toileting steps completed by helper: Adjust clothing prior to toileting, Performs perineal hygiene,  Adjust clothing after toileting Toileting Assistive Devices: Prosthesis/orthosis, Grab bar or rail (stedy)  Toileting assist Assist level: Two helpers   Transfers Chair/bed transfer   Chair/bed transfer method: Squat pivot Chair/bed transfer assist level: 2 helpers Chair/bed transfer assistive device: Armrests     Locomotion Ambulation Ambulation activity did not occur: Safety/medical concerns         Wheelchair   Type:  Manual Max wheelchair distance: 30 Assist Level: Touching or steadying assistance (Pt > 75%)  Cognition Comprehension Comprehension assist level: Follows complex conversation/direction with extra time/assistive device  Expression Expression assist level: Expresses complex ideas: With extra time/assistive device  Social Interaction Social Interaction assist level: Interacts appropriately with others - No medications needed.  Problem Solving Problem solving assist level: Solves basic problems with no assist  Memory Memory assist level: Recognizes or recalls 90% of the time/requires cueing < 10% of the time    Medical Problem List and Plan: 1. Functional deficitssecondary to left BKA  May resume therapy 2. DVT Prophylaxis/Anticoagulation: Pharmaceutical: Heparin 3. Pain Management: Tylenol or Celebrex prn. Unable to tolerate muscle relaxers or pain medications due to SE of tremors, confusion and hallucinations.  4. Mood: LCSW to follow for evaluation and support.  5. Neuropsych: This patient is not fullycapable of making decisions on his own behalf. 6. Skin/Wound Care: Routine pressure relief measures. Prevalon boot for right foot. Monitor daily for intake to promote healing. Protein supplement and vitamins to promote wound healing.  7. Fluids/Electrolytes/Nutrition: Monitor I/O. Lytes/CBC per PD protocol.  8. Leucocytosis: WBC has fluctuated. Monitor for fevers and other sign of infection.   WBCs 10.1 on 10/10  Cont to monitor 9. Hypotension: Monitor BP bid---better on midodrine.   Improved control 10. ESRD on PD: Schedule PD in evening and early am--unhook by breakfast.    11. Anemia of chronic disease: On aranesp every week.   Hb 7.8 on 10/10  Cont to monitor 12. T2DM insulin dependent: Brittle diabetic who's hard to control in part due to inconsistent intake. Changed to Premier protein with meals.   Labile sugars persist.     NPH adjusted to 25u qam and 55u qpm CBG (last 3)    Recent Labs  01/20/17 1540 01/20/17 2117 01/21/17 0605  GLUCAP 150* 383* 333*    13. Constipation: Continue Colace bid with lactulose prn.  14. PVD with s/p left femoral pseudoaneurysm repair.   Prevalon boot for RLE with pressure relief measures.  15. Delirium: Improved but continues to have occasional hallucinations per wife.    LOS (Days) 4 A FACE TO FACE EVALUATION WAS PERFORMED  Erick Colace 01/21/2017 11:38 AM

## 2017-01-21 NOTE — Progress Notes (Signed)
[facility] ROUNDING NOTE   Subjective:   Interval History:76 y.o.malewith hx of HTN, CVA, ESRD on PD, DM on insulin, COPD and CHFw AICD, hx CAD/ CABG 2011. Pt was admitted on 12/15/16 for non-healing L foot wound and R groin pseudoaneurysm, underwent repair of PA R groin and also resection of left 3rd metatarsal head by Dr Randie Heinz. However, the left foot progressed and developed gangrenous changes, so on 9/14 pt went for L transmetatarsal amputation by Dr Lajoyce Corners.He had a cardiac catheterization demonstrating no critical stenosis and cleared for surgery He continues on peritoneal dialysis(home = 5 2.5 L fills, all 2.5% dialysate, 10 min/1.5 hours/20 min fill/dwell/drain cycle with pause and daytime dwell; altered in the hospital for ease, doing fine)  Some weakness still noted Trying to participate with rehab S/p amputation of fifth right toe 10/12  Objective:  Vital signs in last 24 hours:  Temp:  [97.6 F (36.4 C)-98 F (36.7 C)] 98 F (36.7 C) (10/13 0555) Pulse Rate:  [51-90] 74 (10/13 0555) Resp:  [14-23] 16 (10/13 0555) BP: (114-140)/(24-75) 135/41 (10/13 0555) SpO2:  [91 %-100 %] 100 % (10/13 0555) Weight:  [203 lb (92.1 kg)] 203 lb (92.1 kg) (10/13 0555)  Weight change: -1 lb (-0.454 kg) Filed Weights   01/20/17 0650 01/20/17 0818 01/21/17 0555  Weight: 204 lb (92.5 kg) 203 lb (92.1 kg) 203 lb (92.1 kg)    Intake/Output: I/O last 3 completed shifts: In: 19147 [P.O.:360; I.V.:200; Other:12495] Out: 82956 [Urine:200; OZHYQ:65784; Blood:3]   Intake/Output this shift:  No intake/output data recorded.  CVS RRR Lungs diminished at bases otherwise clear anteriorly Abdomen +BS, soft and not tender. PD catheter left abd with dry dressings Trace -1+ RLE edema L TMA wrapped and some LLE edema Dialysis Access: PD cath, left upper arm AVF   Basic Metabolic Panel:  Recent Labs Lab 01/15/17 0212 01/16/17 0444 01/18/17 0624  NA 132* 133* 129*  K 3.3* 3.7  3.3*  CL 91* 92* 90*  CO2 GLUCOSE 348* 145* 373*  BUN 50* 61* 53*  CREATININE 7.30* 8.20* 7.61*  CALCIUM 8.5* 8.5* 8.6*  PHOS  --  4.6  --     Liver Function Tests:  Recent Labs Lab 01/15/17 0212 01/16/17 0444 01/18/17 0624  AST 132*  --  43*  ALT 7*  --  9*  ALKPHOS 64  --  70  BILITOT 0.4  --  0.4  PROT 5.8*  --  5.4*  ALBUMIN 1.3* 1.4* 1.2*   No results for input(s): LIPASE, AMYLASE in the last 168 hours. No results for input(s): AMMONIA in the last 168 hours.  CBC:  Recent Labs Lab 01/15/17 0802 01/17/17 1634 01/18/17 0624  WBC 11.2* 12.8* 10.1  NEUTROABS  --   --  7.6  HGB 7.7* 8.3* 7.8*  HCT 24.5* 26.2* 25.4*  MCV 96.5 97.4 97.3  PLT 436* 501* 462*    Cardiac Enzymes: No results for input(s): CKTOTAL, CKMB, CKMBINDEX, TROPONINI in the last 168 hours.  BNP: Invalid input(s): POCBNP  CBG:  Recent Labs Lab 01/20/17 1047 01/20/17 1155 01/20/17 1540 01/20/17 2117 01/21/17 0605  GLUCAP 183* 162* 150* 383* 333*    Microbiology: Results for orders placed or performed during the hospital encounter of 01/06/17  MRSA PCR Screening     Status: None   Collection Time: 01/08/17  4:15 PM  Result Value Ref Range Status   MRSA by PCR NEGATIVE NEGATIVE Final    Comment:  The GeneXpert MRSA Assay (FDA approved for NASAL specimens only), is one component of a comprehensive MRSA colonization surveillance program. It is not intended to diagnose MRSA infection nor to guide or monitor treatment for MRSA infections.   Surgical pcr screen     Status: None   Collection Time: 01/11/17  4:00 AM  Result Value Ref Range Status   MRSA, PCR NEGATIVE NEGATIVE Final   Staphylococcus aureus NEGATIVE NEGATIVE Final    Comment: (NOTE) The Xpert SA Assay (FDA approved for NASAL specimens in patients 29 years of age and older), is one component of a comprehensive surveillance program. It is not intended to diagnose infection nor to guide or monitor  treatment.     Coagulation Studies: No results for input(s): LABPROT, INR in the last 72 hours.  Urinalysis: No results for input(s): COLORURINE, LABSPEC, PHURINE, GLUCOSEU, HGBUR, BILIRUBINUR, KETONESUR, PROTEINUR, UROBILINOGEN, NITRITE, LEUKOCYTESUR in the last 72 hours.  Invalid input(s): APPERANCEUR    Imaging: No results found.   Medications:   . dialysis solution 2.5% low-MG/low-CA    . dialysis solution 2.5% low-MG/low-CA     . aspirin  81 mg Oral Daily  . atorvastatin  80 mg Oral q1800  . calcium acetate  667 mg Oral TID WC  . carvedilol  12.5 mg Oral QHS  . clopidogrel  75 mg Oral Q breakfast  . darbepoetin (ARANESP) injection - NON-DIALYSIS  200 mcg Subcutaneous Weekly  . docusate sodium  100 mg Oral BID  . feeding supplement (PRO-STAT SUGAR FREE 64)  30 mL Oral BID  . gentamicin cream  1 application Topical BID  . gentamicin cream  1 application Topical Daily  . heparin subcutaneous  5,000 Units Subcutaneous Q8H  . insulin aspart  0-5 Units Subcutaneous QHS  . insulin aspart  0-9 Units Subcutaneous TID WC  . insulin NPH Human  25 Units Subcutaneous QAC breakfast   And  . insulin NPH Human  50 Units Subcutaneous QHS  . midodrine  5 mg Oral BID WC  . multivitamin  1 tablet Oral QHS  . protein supplement shake  2 oz Oral TID WC  . senna-docusate  1 tablet Oral QHS  . [START ON 02/04/2017] Vitamin D (Ergocalciferol)  50,000 Units Oral Q30 days   acetaminophen, aluminum hydroxide, bisacodyl, diphenhydrAMINE, Gerhardt's butt cream, guaiFENesin-dextromethorphan, dianeal solution for CAPD/CCPD with heparin, lactulose, nitroGLYCERIN, ondansetron **OR** ondansetron (ZOFRAN) IV, oxyCODONE, simethicone, sodium phosphate, traZODone  Assessment/ Plan:  1. PAD- s/p open repair of R common femoral artery pseudoaneurysm (VVS) and L transmet 12/23/16 (Dr. Lajoyce Corners). 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 Lajoyce Corners) s/p amputation R 5th toe 10/12 (Dr Myra Gianotti) 2. Chest pain/+  troponins. Prior CABG. Had stent to RCA 07/2016 d/t NSTEMI. Has ICD in place. Recurrent CP/+ trops. Cath 10/1 no intervention required.  3. ESRD- CCPD, using 2.5% dextrose at home; Altered regimen for ease in hospital (no pause or day bag) Had been using 1/2 and 1/2 past few days Using all 2.5 % bags for now BP controlled for now  4. Orthostatic hypotension - midodrine 5. Anemia (ABLA)- TSAT 45% and Ferritin 2284, ^Aranesp 200 qThurs. Not given yet this admission that I can see (ordered "with HD" which may be the issue). Hb 7.8 6. Constipation: miralax as needed 7. Hypokalemia: Monitor for need for replacement. Give 30 po today.  8. Myoclonus / Confusion: stopped gabapentin and robaxin. Not confused past 2 days that I can see 9. Secondary HPT - phoslo. Regular diet to promote nutrition  10. DM - per primary team 11. Low grade temp- resolved blood cultures neg- fluid cell count fine - S/p BKA  12. Hypokalemia will replete last labs 10/10   Doing well with rehab no changes this morning   LOS: 4 Quantavious Eggert W  :24 AM

## 2017-01-21 NOTE — Progress Notes (Addendum)
Occupational Therapy Session Note  Patient Details  Name: [name] MRN: [id number] Date of Birth: 1941/02/15  Today's Date: 01/21/2017 OT Individual Time: 4098-1191 and 4782-9562 OT Individual Time Calculation (min): 61 min and 70 min   Short Term Goals: Week 1:  OT Short Term Goal 1 (Week 1): Pt will transfer to toilet with mod assist squat pivot vs slide board OT Short Term Goal 2 (Week 1): Pt will perform sit to stand with mod A in prep for clothing management OT Short Term Goal 3 (Week 1): Pt will don pants with mod assist with lateral leans OT Short Term Goal 4 (Week 1): Pt will navigate w/c around room to obtain clothing for ADL with min A   Skilled Therapeutic Interventions/Progress Updates:    Tx focus on ADL retraining, activity tolerance, and pressure relief education during self care.    Per MD R LE WB at heel s/p Rt toe amputation. No post op boot or shoe in room (per RN and spouse). Discussed with RN obtaining order for shoe/boot for safety during transfers.   Pt greeted supine in bed with spouse and RN present. BP 150/55. Hurting, but agreeable to bathing/dressing. Pt transitioned to EOB with extra time and Mod A for elevating trunk. While washing UB, pt exhibiting multiple posterior LOBs with Min A to steady and mod cues for anterior weight shifting. Mod A for balance while removing bilateral UE support to don shirt. Pt returned to supine for LB self care, able to wash R LE in modified figure 4 and left upper leg! Mod A rolling R>L for pericare and lifting pants over hips (though pt earnestly tried to thread pants himself). He was able to lift pants to waist from thigh and pull pants 1/4 way up buttocks with bridging (through Rt heel only). Pt pulling himself up PRN with bilateral UEs with bed in trendelenberg position. Pt repositioned for pressure relief on Rt side with pt/spouse education provided on importance for skin integrity. At end of tx pt was repositioned for  comfort and left with spouse.   Slight oozing noted from L LE amputation site (through sock) at end of tx. RN made aware.   2nd Session 1:1 tx (70 min) Tx focus on functional transfers, activity tolerance, and UB strengthening.   Pt greeted supine in bed. Agreeable to go outside with encouragement from spouse and OT. Still no post op shoe. Pt completed slideboard transfer to w/c with 2 helpers, using bilateral UEs and Rt heel to assist. However, pt with significant weakness and his physical effort amounted to 10% during transfer. Pt was then escorted outside. Engaged him and spouse in reminiscence conversation for social participation and memory challenges. Pt requiring max cues for recall of past hobbies and names of close relatives. He self propelled up/down slight inclines with significantly extra time, cues on technique, and Mod-Max A to avoid environmental barriers. Pt c/o feeling cold and requesting to go indoors after. Pt escorted back to room and left with spouse. C/o 8/10 pain with RN made aware. No c/o dizziness or lightheadness throughout tx. Pt agreeable to sit up in w/c for next therapist (to arrive in 45 minutes).   Therapy Documentation Precautions:  Precautions Precautions: Fall Precaution Comments: PD port, VAC left residual limb Restrictions Weight Bearing Restrictions: Yes LLE Weight Bearing: Non weight bearing Pain: Addressed as written above  Pain Assessment Pain Score: 0-No pain ADL:   :   See Function Navigator for Current Functional Status.  Therapy/Group: Individual Therapy  Halley Kincer A Tajuan Dufault 01/21/2017, 12:40 PM

## 2017-01-21 NOTE — Anesthesia Postprocedure Evaluation (Signed)
Anesthesia Post Note  Patient: [name]  Procedure(s) Performed: AMPUTATION RIGHT FIFTH TOE (Right Foot)     Patient location during evaluation: PACU Anesthesia Type: MAC Level of consciousness: awake and alert Pain management: pain level controlled Vital Signs Assessment: post-procedure vital signs reviewed and stable Respiratory status: spontaneous breathing and respiratory function stable Cardiovascular status: stable Postop Assessment: no apparent nausea or vomiting Anesthetic complications: no    Last Vitals:  Vitals:   01/21/17 0555 01/21/17 1700  BP: (!) 135/41 (!) 152/64  Pulse: 74 88  Resp: 16 17  Temp: 36.7 C 36.5 C  SpO2: 100% 98%    Last Pain:  Vitals:   01/21/17 1700  TempSrc: Axillary  PainSc:                  Jalaine Riggenbach,Herald DANIEL

## 2017-01-21 NOTE — Progress Notes (Signed)
Physical Therapy Session Note  Patient Details  Name: [name] MRN: [id number] Date of Birth: [birth year]  Today's Date: 01/21/2017 PT Individual Time: 1415-1510 PT Individual Time Calculation (min): 55 min   Short Term Goals: Week 1:  PT Short Term Goal 1 (Week 1): supervision supine<>sitting PT Short Term Goal 2 (Week 1): Pt to transfer bed<>w/c with sliding board and mod assist PT Short Term Goal 3 (Week 1): Pt to propel w/c X 75 feet with supervision  Skilled Therapeutic Interventions/Progress Updates:   Pt in w/c upon arrival and agreeable to therapy, no c/o pain. Worked on transfers this session. Pt requesting to use toilet. Transferred to/from toilet w/ Max A x2 via lateral scoot. Pt then transferred w/c to bed via squat pivot w/ Max a x2. Pt sit>supine w/ Min A. Pt required many multimodal cues for transfers causing transfers to take increased time. He requires Total A to for safe set-up and leaning forward to facilitate weight-shifting for transfers. Max cues for UE placement and adhering to safety precautions (WBAT through R heel). Ended session in supine and in care of wife, all needs met. Pt extremely fatigued during and after transfers.   Therapy Documentation Precautions:  Precautions Precautions: Fall Precaution Comments: PD port, VAC left residual limb Restrictions Weight Bearing Restrictions: Yes LLE Weight Bearing: Non weight bearing  See Function Navigator for Current Functional Status.   Therapy/Group: Individual Therapy  Doris Gruhn K Arnette 01/21/2017, 3:16 PM

## 2017-01-21 NOTE — Progress Notes (Signed)
   Right foot dressing is cdi Ok for weight bearing on right heel Dressing takedown tomorrow.   [name] C. Randie Heinz, MD [facility] Office: [phone] Pager: [phone]

## 2017-01-22 ENCOUNTER — Inpatient Hospital Stay (HOSPITAL_COMMUNITY): Payer: Medicare Other

## 2017-01-22 ENCOUNTER — Encounter (HOSPITAL_COMMUNITY): Payer: Self-pay | Admitting: Surgery

## 2017-01-22 LAB — GLUCOSE, CAPILLARY
GLUCOSE-CAPILLARY: 314 mg/dL — AB (ref 65–99)
Glucose-Capillary: 280 mg/dL — ABNORMAL HIGH (ref 65–99)
Glucose-Capillary: 133 mg/dL — ABNORMAL HIGH (ref 65–99)
Glucose-Capillary: 190 mg/dL — ABNORMAL HIGH (ref 65–99)

## 2017-01-22 MED ORDER — INSULIN NPH (HUMAN) (ISOPHANE) 100 UNIT/ML ~~LOC~~ SUSP
25.0000 [IU] | Freq: Every day | SUBCUTANEOUS | Status: DC
  Administered 2017-01-23 – 2017-01-26 (×4): 25 [IU] via SUBCUTANEOUS
  Filled 2017-01-22: qty 10

## 2017-01-22 MED ORDER — HEPARIN SODIUM (PORCINE) 5000 UNIT/ML IJ SOLN
5000.0000 [IU] | Freq: Three times a day (TID) | INTRAMUSCULAR | Status: DC
  Administered 2017-01-22 – 2017-02-07 (×47): 5000 [IU] via SUBCUTANEOUS
  Filled 2017-01-22 (×47): qty 1

## 2017-01-22 MED ORDER — INSULIN NPH (HUMAN) (ISOPHANE) 100 UNIT/ML ~~LOC~~ SUSP
60.0000 [IU] | Freq: Every day | SUBCUTANEOUS | Status: DC
  Administered 2017-01-22 – 2017-01-26 (×5): 60 [IU] via SUBCUTANEOUS

## 2017-01-22 MED ORDER — HEPARIN SODIUM (PORCINE) 5000 UNIT/ML IJ SOLN
5000.0000 [IU] | Freq: Three times a day (TID) | INTRAMUSCULAR | Status: DC
  Filled 2017-01-22: qty 1

## 2017-01-22 NOTE — Progress Notes (Signed)
Occupational Therapy Session Note  Patient Details  Name: [name] MRN: [id number] Date of Birth: [birth year]  Today's Date: 01/22/2017 OT Individual Time: 4098-1191 OT Individual Time Calculation (min): 41 min    Short Term Goals: Week 1:  OT Short Term Goal 1 (Week 1): Pt will transfer to toilet with mod assist squat pivot vs slide board OT Short Term Goal 2 (Week 1): Pt will perform sit to stand with mod A in prep for clothing management OT Short Term Goal 3 (Week 1): Pt will don pants with mod assist with lateral leans OT Short Term Goal 4 (Week 1): Pt will navigate w/c around room to obtain clothing for ADL with min A   Skilled Therapeutic Interventions/Progress Updates:    1:1. Pt reportin 6/10 pain in B legs. RN aware. OT threads BLE into pant legs and pt partially bridges through R heel to pull pants over R hip,however unable to maintain NWB through LLE. Pt rolls B with min A and VC for sequencing as OT advances pants past hips. Pt completes lateral scoot transfer with MAX A to R with VC for WB precautions, anterior weight shift and hand placement. Pt propels w/c 10 feet x2 with VC for steering/turning. Pt tosses ball 2x40 passes to improve BUE coordination, strength and endurance. Pt requires VC to attend to task as ~every 30 seconds. Pt reporting max fatigue and rests in w/c with RN present to administer medication before OT exits room.  Therapy Documentation Precautions:  Precautions Precautions: Fall Precaution Comments: PD port, VAC left residual limb Restrictions Weight Bearing Restrictions: Yes LLE Weight Bearing: Non weight bearing (WB on Right Heel only, NWB bottom foot)  See Function Navigator for Current Functional Status.   Therapy/Group: Individual Therapy  [name] 01/22/2017, 3:27 PM

## 2017-01-22 NOTE — Progress Notes (Signed)
[facility] [facility] PHYSICAL MEDICINE & REHABILITATION     PROGRESS NOTE  Subjective/Complaints:   Appreciate VVS note slept okay last night  ROS: pt denies nausea, vomiting, diarrhea, cough, shortness of breath or chest pain   Objective: Vital Signs: Blood pressure (!) 151/83, pulse 84, temperature 97.9 F (36.6 C), temperature source Oral, resp. rate 16, height  (1.753 m), weight 92.2 kg (203 lb 2.7 oz), SpO2 100 %. No results found. No results for input(s): WBC, HGB, HCT, PLT in the last 72 hours. No results for input(s): NA, K, CL, GLUCOSE, BUN, CREATININE, CALCIUM in the last 72 hours.  Invalid input(s): CO CBG (last 3)   Recent Labs  01/21/17 1638 01/21/17 2111 01/22/17 0627  GLUCAP 124* 269* 314*    Wt Readings from Last 3 Encounters:  01/22/17 92.2 kg (203 lb 2.7 oz)  01/17/17 86.7 kg (191 lb 2.2 oz)  01/05/17 87.9 kg (193 lb 12.6 oz)    Physical Exam:  BP (!) 151/83 (BP Location: Right Wrist)   Pulse 84   Temp 97.9 F (36.6 C) (Oral)   Resp 16   Ht  (1.753 m)   Wt 92.2 kg (203 lb 2.7 oz)   SpO2 100%   BMI 30.00 kg/m  Constitutional: He appears well-nourished. No distress. Obese. HENT: Normocephalic. Areas of abrasion healing.  Eyes: EOMI. No discharge.  Cardiovascular: RRR without murmur. No JVD . Respiratory: Effort normaland breath sounds normal.  GI: Soft. Bowel sounds are normal.   Musculoskeletal: He exhibits edemaand tenderness Neurological: He is alert.  Motor: 4/5 grossly throughout (stable) Skin:  Right fifth toe ulcer unchanged Left BKA incision clean dry intact Psychiatric: His speech is normal. He is withdrawn.   Assessment/Plan: 1. Functional deficits secondary to left BKA which require 3+ hours per day of interdisciplinary therapy in a comprehensive inpatient rehab setting. Physiatrist is providing close team supervision and 24 hour management of active medical problems listed below. Physiatrist and rehab team continue to  assess barriers to discharge/monitor patient progress toward functional and medical goals.  Function:  Bathing Bathing position   Position: Other (comment) (EOB UB and bedlevel LB)  Bathing parts Body parts bathed by patient: Right arm, Left arm, Chest, Abdomen, Front perineal area, Left upper leg, Right upper leg, Right lower leg Body parts bathed by helper: Buttocks, Back  Bathing assist Assist Level:  (Mod assist)      Upper Body Dressing/Undressing Upper body dressing   What is the patient wearing?: Pull over shirt/dress     Pull over shirt/dress - Perfomed by patient: Thread/unthread right sleeve, Thread/unthread left sleeve, Put head through opening Pull over shirt/dress - Perfomed by helper: Pull shirt over trunk        Upper body assist Assist Level: Touching or steadying assistance(Pt > 75%)      Lower Body Dressing/Undressing Lower body dressing   What is the patient wearing?: Pants, Non-skid slipper socks   Underwear - Performed by helper: Thread/unthread right underwear leg, Thread/unthread left underwear leg, Pull underwear up/down   Pants- Performed by helper: Thread/unthread right pants leg, Thread/unthread left pants leg, Pull pants up/down   Non-skid slipper socks- Performed by helper: Don/doff right sock                  Lower body assist Assist for lower body dressing:  (Total assist)      Toileting Toileting Toileting activity did not occur: Safety/medical concerns   Toileting steps completed by helper: Adjust clothing  prior to toileting, Performs perineal hygiene, Adjust clothing after toileting Toileting Assistive Devices: Prosthesis/orthosis, Grab bar or rail (stedy)  Toileting assist Assist level: Two helpers   Transfers Chair/bed transfer   Chair/bed transfer method: Lateral scoot Chair/bed transfer assist level: 2 helpers Chair/bed transfer assistive device: Sliding board, Armrests     Locomotion Ambulation Ambulation activity did  not occur: Safety/medical concerns         Wheelchair   Type: Manual Max wheelchair distance: 30 Assist Level: Touching or steadying assistance (Pt > 75%)  Cognition Comprehension Comprehension assist level: Follows complex conversation/direction with extra time/assistive device  Expression Expression assist level: Expresses basic needs/ideas: With extra time/assistive device  Social Interaction Social Interaction assist level: Interacts appropriately with others with medication or extra time (anti-anxiety, antidepressant).  Problem Solving Problem solving assist level: Solves basic 90% of the time/requires cueing < 10% of the time  Memory Memory assist level: Complete Independence: No helper    Medical Problem List and Plan: 1. Functional deficitssecondary to left BKA  CIR PT, OT, may weight bear through the heel on the right side 2. DVT Prophylaxis/Anticoagulation: Pharmaceutical: Heparin 3. Pain Management: Tylenol or Celebrex prn. Unable to tolerate muscle relaxers or pain medications due to SE of tremors, confusion and hallucinations.  4. Mood: LCSW to follow for evaluation and support.  5. Neuropsych: This patient is not fullycapable of making decisions on his own behalf. 6. Skin/Wound Care: Routine pressure relief measures. Prevalon boot for right foot. Monitor daily for intake to promote healing. Protein supplement and vitamins to promote wound healing.  7. Fluids/Electrolytes/Nutrition: Monitor I/O. Lytes/CBC per PD protocol.  8. Leucocytosis: WBC has fluctuated. Monitor for fevers and other sign of infection.   WBCs 10.1 on 10/10  Cont to monitor 9. Hypotension: Monitor BP bid---better on midodrine.    Vitals:   01/21/17 1700 01/22/17 0606  BP: (!) 152/64 (!) 151/83  Pulse: 88 84  Resp: 17 16  Temp: 97.7 F (36.5 C) 97.9 F (36.6 C)  SpO2: 98% 100%   10. ESRD on PD: Schedule PD in evening and early am--unhook by breakfast.    11. Anemia of chronic disease: On  aranesp every week.   Hb 7.8 on 10/10  Cont to monitor 12. T2DM insulin dependent: Brittle diabetic who's hard to control in part due to inconsistent intake. Changed to Premier protein with meals.   Labile sugars persist.     NPH adjusted to 25u qam and 60u qpm CBG (last 3)   Recent Labs  01/21/17 1638 01/21/17 2111 01/22/17 0627  GLUCAP 124* 269* 314*    13. Constipation: Continue Colace bid with lactulose prn.  14. PVD with s/p left femoral pseudoaneurysm repair.   Prevalon boot for RLE with pressure relief measures.  15. Delirium: Improved but continues to have occasional hallucinations per wife.    LOS (Days) 5 A FACE TO FACE EVALUATION WAS PERFORMED  Claudette Laws E 01/22/2017 11:19 AM

## 2017-01-22 NOTE — Progress Notes (Signed)
[facility] ROUNDING NOTE   Subjective:   Interval History:76 y.o.malewith hx of HTN, CVA, ESRD on PD, DM on insulin, COPD and CHFw AICD, hx CAD/ CABG 2011. Pt was admitted on 12/15/16 for non-healing L foot wound and R groin pseudoaneurysm, underwent repair of PA R groin and also resection of left 3rd metatarsal head by Dr Randie Heinz. However, the left foot progressed and developed gangrenous changes, so on 9/14 pt went for L transmetatarsal amputation by Dr Lajoyce Corners.He had a cardiac catheterization demonstrating no critical stenosis and cleared for surgery He continues on peritoneal dialysis(home = 5 2.5 L fills, all 2.5% dialysate, 10 min/1.5 hours/20 min fill/dwell/drain cycle with pause and daytime dwell; altered in the hospital for ease, doing fine)  Some weakness still noted Trying to participate with rehab S/p amputation of fifth right toe 10/12  Objective:  Vital signs in last 24 hours:  Temp:  [97.7 F (36.5 C)-97.9 F (36.6 C)] 97.9 F (36.6 C) (10/14 0606) Pulse Rate:  [84-88] 84 (10/14 0606) Resp:  [16-17] 16 (10/14 0606) BP: (151-152)/(64-83) 151/83 (10/14 0606) SpO2:  [98 %-100 %] 100 % (10/14 0606) Weight:  [203 lb 2.7 oz (92.2 kg)] 203 lb 2.7 oz (92.2 kg) (10/14 0606)  Weight change: 2.7 oz (0.078 kg) Filed Weights   01/20/17 0818 01/21/17 0555 01/22/17 0606  Weight: 203 lb (92.1 kg) 203 lb (92.1 kg) 203 lb 2.7 oz (92.2 kg)    Intake/Output: I/O last 3 completed shifts: In: 16109 [P.O.:746; Other:12495] Out: 13721 [Other:13721]   Intake/Output this shift:  No intake/output data recorded.  CVS RRR Lungs diminished at bases otherwise clear anteriorly Abdomen +BS, soft and not tender. PD catheter left abd with dry dressings Trace -1+ RLE edema  Right amp site clean and dry L BKA wrapped and some LLE edema Dialysis Access: PD cath, left upper arm AVF   Basic Metabolic Panel:  Recent Labs Lab 01/16/17 0444 01/18/17 0624  NA 133* 129*  K 3.7  3.3*  CL 92* 90*  CO2 25 27  GLUCOSE 145* 373*  BUN 61* 53*  CREATININE 8.20* 7.61*  CALCIUM 8.5* 8.6*  PHOS 4.6  --     Liver Function Tests:  Recent Labs Lab 01/16/17 0444 01/18/17 0624  AST  --  43*  ALT  --  9*  ALKPHOS  --  70  BILITOT  --  0.4  PROT  --  5.4*  ALBUMIN 1.4* 1.2*   No results for input(s): LIPASE, AMYLASE in the last 168 hours. No results for input(s): AMMONIA in the last 168 hours.  CBC:  Recent Labs Lab 01/17/17 1634 01/18/17 0624  WBC 12.8* 10.1  NEUTROABS  --  7.6  HGB 8.3* 7.8*  HCT 26.2* 25.4*  MCV 97.4 97.3  PLT 501* 462*    Cardiac Enzymes: No results for input(s): CKTOTAL, CKMB, CKMBINDEX, TROPONINI in the last 168 hours.  BNP: Invalid input(s): POCBNP  CBG:  Recent Labs Lab 01/21/17 0605 01/21/17 1200 01/21/17 1638 01/21/17 2111 01/22/17 0627  GLUCAP 333* 274* 124* 269* 314*    Microbiology: Results for orders placed or performed during the hospital encounter of 01/06/17  MRSA PCR Screening     Status: None   Collection Time: 01/08/17  4:15 PM  Result Value Ref Range Status   MRSA by PCR NEGATIVE NEGATIVE Final    Comment:        The GeneXpert MRSA Assay (FDA approved for NASAL specimens only), is one component of a comprehensive  MRSA colonization surveillance program. It is not intended to diagnose MRSA infection nor to guide or monitor treatment for MRSA infections.   Surgical pcr screen     Status: None   Collection Time: 01/11/17  4:00 AM  Result Value Ref Range Status   MRSA, PCR NEGATIVE NEGATIVE Final   Staphylococcus aureus NEGATIVE NEGATIVE Final    Comment: (NOTE) The Xpert SA Assay (FDA approved for NASAL specimens in patients 67 years of age and older), is one component of a comprehensive surveillance program. It is not intended to diagnose infection nor to guide or monitor treatment.     Coagulation Studies: No results for input(s): LABPROT, INR in the last 72 hours.  Urinalysis: No  results for input(s): COLORURINE, LABSPEC, PHURINE, GLUCOSEU, HGBUR, BILIRUBINUR, KETONESUR, PROTEINUR, UROBILINOGEN, NITRITE, LEUKOCYTESUR in the last 72 hours.  Invalid input(s): APPERANCEUR    Imaging: No results found.   Medications:   . dialysis solution 2.5% low-MG/low-CA    . dialysis solution 2.5% low-MG/low-CA     . aspirin  81 mg Oral Daily  . atorvastatin  80 mg Oral q1800  . calcium acetate  667 mg Oral TID WC  . carvedilol  12.5 mg Oral QHS  . clopidogrel  75 mg Oral Q breakfast  . [START ON 01/26/2017] darbepoetin (ARANESP) injection - NON-DIALYSIS  200 mcg Subcutaneous Q Thu-1800  . docusate sodium  100 mg Oral BID  . feeding supplement (PRO-STAT SUGAR FREE 64)  30 mL Oral BID  . gentamicin cream  1 application Topical BID  . gentamicin cream  1 application Topical Daily  . heparin subcutaneous  5,000 Units Subcutaneous Q8H  . insulin aspart  0-5 Units Subcutaneous QHS  . insulin aspart  0-9 Units Subcutaneous TID WC  . insulin NPH Human  60 Units Subcutaneous QHS   And  . [START ON 01/23/2017] insulin NPH Human  25 Units Subcutaneous QAC breakfast  . midodrine  5 mg Oral BID WC  . multivitamin  1 tablet Oral QHS  . protein supplement shake  2 oz Oral TID WC  . senna-docusate  1 tablet Oral QHS  . [START ON 02/04/2017] Vitamin D (Ergocalciferol)  50,000 Units Oral Q30 days   acetaminophen, aluminum hydroxide, bisacodyl, diphenhydrAMINE, Gerhardt's butt cream, guaiFENesin-dextromethorphan, dianeal solution for CAPD/CCPD with heparin, lactulose, nitroGLYCERIN, ondansetron **OR** ondansetron (ZOFRAN) IV, oxyCODONE, simethicone, sodium phosphate, traZODone  Assessment/ Plan:  1. PAD- s/p open repair of R common femoral artery pseudoaneurysm ([facility]) and L transmet 12/23/16 (Dr. Lajoyce Corners). 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 Lajoyce Corners) s/p amputation R 5th toe 10/12 (Dr Myra Gianotti) 2. Chest pain/+ troponins. Prior CABG. Had stent to RCA 07/2016 d/t NSTEMI. Has ICD in place.  Recurrent CP/+ trops. Cath 10/1 no intervention required.  3. ESRD- CCPD, using 2.5% dextrose at home; Altered regimen for ease in hospital (no pause or day bag) Had been using 1/2 and 1/2 past few days Using all 2.5 % bags for now BP controlled for now  4. Orthostatic hypotension - midodrine 5. Anemia (ABLA)- TSAT 45% and Ferritin 2284, ^Aranesp 200 qThurs. Not given yet this admission that I can see (ordered "with HD" which may be the issue). Hb 7.8 6. Constipation: miralax as needed 7. Hypokalemia: Monitor for need for replacement. 8. Myoclonus / Confusion: stopped gabapentin and robaxin. Not confused past 2 days that I can see 9. Secondary HPT - phoslo. Regular diet to promote nutrition 10. DM - per primary team 11. Low grade temp- resolved blood cultures neg-  fluid cell count fine - S/p BKA  12. Hypokalemia will replete last labs 10/10   Doing well with rehab no changes will order some labs for AM     LOS: 5 Sigismund Cross W  :36 AM

## 2017-01-22 NOTE — Progress Notes (Signed)
Chart notes BM 01/21/17. Pt and wife report pt has not had a BM since 01/19/17. Pt and wife asked for laxative to help pt go. Lactulose administered with AM medications.

## 2017-01-22 NOTE — Progress Notes (Signed)
   Right 5th toe amp site cdi with sutures  Weight bear on right heel only.  Kaylib Furness C. Randie Heinz, MD [facility] Office: [phone] Pager: [phone]

## 2017-01-23 ENCOUNTER — Inpatient Hospital Stay (HOSPITAL_COMMUNITY): Payer: Medicare Other | Admitting: Occupational Therapy

## 2017-01-23 ENCOUNTER — Inpatient Hospital Stay (HOSPITAL_COMMUNITY): Payer: Medicare Other | Admitting: Physical Therapy

## 2017-01-23 LAB — GLUCOSE, CAPILLARY
GLUCOSE-CAPILLARY: 86 mg/dL (ref 65–99)
GLUCOSE-CAPILLARY: 72 mg/dL (ref 65–99)
GLUCOSE-CAPILLARY: 109 mg/dL — AB (ref 65–99)
Glucose-Capillary: 303 mg/dL — ABNORMAL HIGH (ref 65–99)

## 2017-01-23 LAB — RENAL FUNCTION PANEL
ALBUMIN: 1.5 g/dL — AB (ref 3.5–5.0)
ANION GAP: 14 (ref 5–15)
BUN: 62 mg/dL — ABNORMAL HIGH (ref 6–20)
CHLORIDE: 90 mmol/L — AB (ref 101–111)
CO2: 28 mmol/L (ref 22–32)
Calcium: 9.1 mg/dL (ref 8.9–10.3)
Creatinine, Ser: 7.18 mg/dL — ABNORMAL HIGH (ref 0.61–1.24)
GFR calc Af Amer: 8 mL/min — ABNORMAL LOW (ref >60)
GFR, EST NON AFRICAN AMERICAN: 7 mL/min — AB (ref >60)
Glucose, Bld: 288 mg/dL — ABNORMAL HIGH (ref 65–99)
PHOSPHORUS: 3.8 mg/dL (ref 2.5–4.6)
POTASSIUM: 3.2 mmol/L — AB (ref 3.5–5.1)
SODIUM: 132 mmol/L — AB (ref 135–145)

## 2017-01-23 LAB — CBC
HEMATOCRIT: 29.6 % — AB (ref 39.0–52.0)
HEMOGLOBIN: 9.1 g/dL — AB (ref 13.0–17.0)
MCH: 30.3 pg (ref 26.0–34.0)
MCHC: 30.7 g/dL (ref 30.0–36.0)
MCV: 98.7 fL (ref 78.0–100.0)
Platelets: 507 10*3/uL — ABNORMAL HIGH (ref 150–400)
RBC: 3 MIL/uL — AB (ref 4.22–5.81)
RDW: 16.9 % — AB (ref 11.5–15.5)
WBC: 9.2 10*3/uL (ref 4.0–10.5)

## 2017-01-23 NOTE — Progress Notes (Signed)
PD tx initiated via tenckhoff w/o problem, VSS, report given to Kelli Hope, RN

## 2017-01-23 NOTE — Progress Notes (Signed)
Orthopedic Tech Progress Note Patient Details:  [name] 06-Dec-1940 [id number]  Patient ID: [name], male   DOB: 1940/06/27, 76 y.o.   MRN: [id number]   Saul Fordyce 01/23/2017, 2:03 PMCalled [facility] for right  Darco shoe.

## 2017-01-23 NOTE — Progress Notes (Signed)
[place] PHYSICAL MEDICINE & REHABILITATION     PROGRESS NOTE  Subjective/Complaints:  Pt seen laying in bed this AM.  Wife at bedside.  Pt slept well overnight and had a good weekend.    ROS: Denies nausea, vomiting, diarrhea, shortness of breath or chest pain   Objective: Vital Signs: Blood pressure (!) 145/56, pulse 94, temperature (!) 97.5 F (36.4 C), temperature source Oral, resp. rate 18, height  (1.753 m), weight 92.3 kg (203 lb 6.6 oz), SpO2 99 %. No results found.  Recent Labs  01/23/17 0604  WBC 9.2  HGB 9.1*  HCT 29.6*  PLT 507*    Recent Labs  01/23/17 0604  NA 132*  K 3.2*  CL 90*  GLUCOSE 288*  BUN 62*  CREATININE 7.18*  CALCIUM 9.1   CBG (last 3)   Recent Labs  01/22/17 1646 01/22/17 2031 01/23/17 0622  GLUCAP 133* 190* 303*    Wt Readings from Last 3 Encounters:  01/23/17 92.3 kg (203 lb 6.6 oz)  01/17/17 86.7 kg (191 lb 2.2 oz)  01/05/17 87.9 kg (193 lb 12.6 oz)    Physical Exam:  BP (!) 145/56 (BP Location: Right Arm)   Pulse 94   Temp (!) 97.5 F (36.4 C) (Oral)   Resp 18   Ht  (1.753 m)   Wt 92.3 kg (203 lb 6.6 oz)   SpO2 99%   BMI 30.04 kg/m  Constitutional: He appears well-nourished. No distress. Obese. HENT: Normocephalic. Areas of abrasion healing.  Eyes: EOMI. No discharge.  Cardiovascular: RRR. No JVD . Respiratory: Effort normal and breath sounds normal.  GI: Soft. Bowel sounds are normal.   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  Motor: 4/5 grossly throughout (unchanged) Skin:  Incisions with dressing c/d/i Psychiatric: His speech is normal. He is withdrawn.   Assessment/Plan: 1. Functional deficits secondary to left BKA which require 3+ hours per day of interdisciplinary therapy in a comprehensive inpatient rehab setting. Physiatrist is providing close team supervision and 24 hour management of active medical problems listed  below. Physiatrist and rehab team continue to assess barriers to discharge/monitor patient progress toward functional and medical goals.  Function:  Bathing Bathing position   Position: Other (comment) (EOB UB and bedlevel LB)  Bathing parts Body parts bathed by patient: Right arm, Left arm, Chest, Abdomen, Front perineal area, Left upper leg, Right upper leg, Right lower leg Body parts bathed by helper: Buttocks, Back  Bathing assist Assist Level:  (Mod assist)      Upper Body Dressing/Undressing Upper body dressing   What is the patient wearing?: Pull over shirt/dress     Pull over shirt/dress - Perfomed by patient: Thread/unthread right sleeve, Thread/unthread left sleeve, Put head through opening Pull over shirt/dress - Perfomed by helper: Pull shirt over trunk        Upper body assist Assist Level: Touching or steadying assistance(Pt > 75%)      Lower Body Dressing/Undressing Lower body dressing   What is the patient wearing?: Pants, Non-skid slipper socks   Underwear - Performed by helper: Thread/unthread right underwear leg, Thread/unthread left underwear leg, Pull underwear up/down   Pants- Performed by helper: Thread/unthread right pants leg, Thread/unthread left pants leg, Pull pants up/down   Non-skid slipper socks- Performed by helper: Don/doff right sock                  Lower body assist Assist for  lower body dressing:  (Total assist)      Toileting Toileting Toileting activity did not occur: Safety/medical concerns   Toileting steps completed by helper: Adjust clothing prior to toileting, Performs perineal hygiene, Adjust clothing after toileting Toileting Assistive Devices: Prosthesis/orthosis, Grab bar or rail (stedy)  Toileting assist Assist level: Two helpers   Transfers Chair/bed transfer   Chair/bed transfer method: Lateral scoot Chair/bed transfer assist level: 2 helpers Chair/bed transfer assistive device: Sliding board, Armrests      Locomotion Ambulation Ambulation activity did not occur: Safety/medical concerns         Wheelchair   Type: Manual Max wheelchair distance: 30 Assist Level: Touching or steadying assistance (Pt > 75%)  Cognition Comprehension Comprehension assist level: Follows complex conversation/direction with extra time/assistive device  Expression Expression assist level: Expresses basic needs/ideas: With extra time/assistive device  Social Interaction Social Interaction assist level: Interacts appropriately with others with medication or extra time (anti-anxiety, antidepressant).  Problem Solving Problem solving assist level: Solves basic 90% of the time/requires cueing < 10% of the time  Memory Memory assist level: Complete Independence: No helper    Medical Problem List and Plan: 1. Functional deficitssecondary to left BKA  Cont CIR PT, OT  May weight bear through the heel on the right side 2. DVT Prophylaxis/Anticoagulation: Pharmaceutical: Heparin 3. Pain Management: Tylenol or Celebrex prn. Unable to tolerate muscle relaxers or pain medications due to SE of tremors, confusion and hallucinations.  4. Mood: LCSW to follow for evaluation and support.  5. Neuropsych: This patient is not fullycapable of making decisions on his own behalf. 6. Skin/Wound Care: Routine pressure relief measures. Prevalon boot for right foot. Monitor daily for intake to promote healing. Protein supplement and vitamins to promote wound healing.  7. Fluids/Electrolytes/Nutrition: Monitor I/O. Lytes/CBC per PD protocol.  8. Leucocytosis: WBC has fluctuated.   Monitor for fevers and other sign of infection.    WBCs 9.1 on 10/15  Cont to monitor 9. Hypotension: Monitor BP bid---better on midodrine.    Vitals:   01/22/17 2113 01/23/17 0521  BP: (!) 146/81 (!) 145/56  Pulse: 93 94  Resp:  18  Temp:  (!) 97.5 F (36.4 C)  SpO2:  99%   Improving, will consider d/cing meds 10. ESRD on PD: Schedule PD in  evening and early am--unhook by breakfast.  11. Anemia of chronic disease: On aranesp every week.   Hb 7.8 on 10/10  Cont to monitor 12. T2DM insulin dependent: Brittle diabetic who's hard to control in part due to inconsistent intake. Changed to Premier protein with meals.   NPH adjusted to 25u qam and 60u qpm CBG (last 3)   Recent Labs  01/22/17 1646 01/22/17 2031 01/23/17 0622  GLUCAP 133* 190* 303*   Labile, will consider further adjustments based on trend 13. Constipation: Continue Colace bid with lactulose prn.  14. PVD with s/p left femoral pseudoaneurysm repair.   Prevalon boot for RLE with pressure relief measures.  15. Delirium: ?Resolved   LOS (Days) 6 A FACE TO FACE EVALUATION WAS PERFORMED  Yanni Quiroa Karis Juba 01/23/2017 10:12 AM

## 2017-01-23 NOTE — Progress Notes (Signed)
Patient ID: ABDURAHMAN RUGG, male   DOB: 09/04/1940, 76 y.o.   MRN: [id number]  [facility] Progress Note    Subjective:   Complaining of more pain in left leg   Objective:   BP (!) 145/56 (BP Location: Right Arm)   Pulse 94   Temp (!) 97.5 F (36.4 C) (Oral)   Resp 18   Ht  (1.753 m)   Wt 92.3 kg (203 lb 6.6 oz)   SpO2 99%   BMI 30.04 kg/m   Intake/Output: I/O last 3 completed shifts: In: 14782 [P.O.:336; Other:12495] Out: 13861 [Other:13861]   Intake/Output this shift:  Total I/O In: 95621 [Other:12495] Out: 12929 [Other:12929] Weight change: 0.11 kg (3.9 oz)  Physical Exam: Gen: NAD CVS: no rub Resp:bibasilar crackles Abd: benign Ext: s/p LBKA, no edema, LUE AVF +T/B  Labs: BMET  Recent Labs Lab 01/18/17 0624 01/23/17 0604  NA 129* 132*  K 3.3* 3.2*  CL 90* 90*  CO2 27 28  GLUCOSE 373* 288*  BUN 53* 62*  CREATININE 7.61* 7.18*  ALBUMIN 1.2* 1.5*  CALCIUM 8.6* 9.1  PHOS  --  3.8   CBC  Recent Labs Lab 01/17/17 1634 01/18/17 0624 01/23/17 0604  WBC 12.8* 10.1 9.2  NEUTROABS  --  7.6  --   HGB 8.3* 7.8* 9.1*  HCT 26.2* 25.4* 29.6*  MCV 97.4 97.3 98.7  PLT 501* 462* 507*    @ Medications:    . aspirin  81 mg Oral Daily  . atorvastatin  80 mg Oral q1800  . calcium acetate  667 mg Oral TID WC  . carvedilol  12.5 mg Oral QHS  . clopidogrel  75 mg Oral Q breakfast  . [START ON 01/26/2017] darbepoetin (ARANESP) injection - NON-DIALYSIS  200 mcg Subcutaneous Q Thu-1800  . docusate sodium  100 mg Oral BID  . feeding supplement (PRO-STAT SUGAR FREE 64)  30 mL Oral BID  . gentamicin cream  1 application Topical BID  . gentamicin cream  1 application Topical Daily  . heparin subcutaneous  5,000 Units Subcutaneous Q8H  . insulin aspart  0-5 Units Subcutaneous QHS  . insulin aspart  0-9 Units Subcutaneous TID WC  . insulin NPH Human  60 Units Subcutaneous QHS   And  . insulin NPH Human  25 Units Subcutaneous  QAC breakfast  . midodrine  5 mg Oral BID WC  . multivitamin  1 tablet Oral QHS  . protein supplement shake  2 oz Oral TID WC  . senna-docusate  1 tablet Oral QHS  . [START ON 02/04/2017] Vitamin D (Ergocalciferol)  50,000 Units Oral Q30 days   Dialysis Orders:  Center: [place] [facility]. 6 exchanges/day one mid-day exchange (occurs around 9am).  2.5 liters of 2.5% per CCPD 2 liters of 2.5% for last fill and mid day drain. Pt says EDW "about 202" (pre admission) Dr. Denny Peon  Assessment/ Plan:   1. 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 ESRD continue with CCPD 3. ABLA/Anemia of CKD stage 5: continue with Aranesp and transfuse prn 4. CKD-MBD: stable and continue with current regimen 5. Nutrition: renal diet 6. Hypertension: stable 7. Chest pain- s/p cath wtihout new occlusions or stenosis 8. Orthostatic hypotension- on midodrine 9. Disposition- continue with CIR  [name], MD [facility], [facility] Pager [phone] 01/23/2017, 2:50 PM

## 2017-01-23 NOTE — Progress Notes (Addendum)
Physical Therapy Note  Patient Details  Name: [name] MRN: [id number] Date of Birth: 1940/07/05 Today's Date: 01/23/2017    Time: 845-925 40 minutes  1:1 Pt c/o 8/10 pain in Rt foot, meds given during session.  Session focused on increasing activity tolerance and UE strength.  Rolling in bed with min A with total A to don shorts.  Supine to sit with mod A.  Max A sliding board transfer to w/c with cues and manual facilitation for wt shifts.  Pt unable to bear wt on Rt LE so PT encourages pt to use UEs, pt with difficulty with motor planning sliding board transfer.  [name] mobility with increased time and min A 50', 20' in controlled environments.  UBE level 1 2 min fwd, 2 min bkwd with prolonged rest break in between. Pt taken back to room and encouraged to sit up in w/c to improve strength.   Time: 1300-1330 30 minutes  1:1 Pt c/o 8/10 pain in residual limb and Rt foot, RN present and aware and pain meds given.  Pt attempts to refuse all therapy but is agreeable to bed level exercise with encouragement from PT and daughter.  Pt performs supine quad sets, hip abd/add and add squeeze with encouragement and tactile cues.  UE flexion, IR/ER and horizontal abduction with theraband.  PT discussed option for power w/c with pt and daughter and both are agreeable.  Pt's daughter given home measurement sheet and Saddie Benders contacted to begin power w/c eval.  Celeste Tavenner 01/23/2017, 9:26 AM

## 2017-01-23 NOTE — Progress Notes (Signed)
Occupational Therapy Session Note  Patient Details  Name: [name] MRN: [id number] Date of Birth: 1940/06/17  Today's Date: 01/23/2017 OT Individual Time: 4098-1191 and 1430-1445 OT Individual Time Calculation (min): 45 min and 15 min and Today's Date: 01/23/2017 OT Missed Time: 15 Minutes and 15 mins Missed Time Reason: Patient fatigue;Pain   Short Term Goals: Week 1:  OT Short Term Goal 1 (Week 1): Pt will transfer to toilet with mod assist squat pivot vs slide board OT Short Term Goal 2 (Week 1): Pt will perform sit to stand with mod A in prep for clothing management OT Short Term Goal 3 (Week 1): Pt will don pants with mod assist with lateral leans OT Short Term Goal 4 (Week 1): Pt will navigate w/c around room to obtain clothing for ADL with min A   Skilled Therapeutic Interventions/Progress Updates:    1) Treatment session with focus on sit > stand and standing endurance.  Pt received upright in w/c reporting need to toilet.  +2 sit > stand in [place] with towel positioned under Rt foot to maintain WB only through Rt heel. +2 for anterior weight shift and manual facilitation of weight shift.  Pt with +BM on toilet.  Sit > stand x3 for hygiene with +2 assist 1 to maintain upright posture while 2nd person completed hygiene.  Pt reports too fatigued to continue standing trials, therefore returned to bed to complete hygiene.  Pt left in bed secondary to pain and fatigue.  Wife present, asking questions regarding increased fatigue questioning dehydration.  2) Treatment session with focus on education with pt's daughter.  Pt supine in bed asleep, aroused to voice but shaking head "no" to any activity even bed level.   Reports having recently received pain meds and increased drowsiness.  Engaged in discussion with pt's daughter regarding pt hobbies and activities that might increase motivation and participation.  Pt missed 15 mins due to fatigue.  Therapy Documentation Precautions:   Precautions Precautions: Fall Precaution Comments: PD port, VAC left residual limb Restrictions Weight Bearing Restrictions: Yes LLE Weight Bearing: Non weight bearing (WB on Right Heel only, NWB bottom foot) General: General OT Amount of Missed Time: 15 Minutes Pain:  Pt with c/o pain, 8/10 in Lt residual limb.  RN notified  See Function Navigator for Current Functional Status.   Therapy/Group: Individual Therapy  Annell Canty 01/23/2017, 11:00 AM

## 2017-01-24 ENCOUNTER — Inpatient Hospital Stay (HOSPITAL_COMMUNITY): Payer: Medicare Other | Admitting: Occupational Therapy

## 2017-01-24 ENCOUNTER — Inpatient Hospital Stay (HOSPITAL_COMMUNITY): Payer: Medicare Other

## 2017-01-24 ENCOUNTER — Inpatient Hospital Stay (HOSPITAL_COMMUNITY): Payer: Medicare Other | Admitting: Physical Therapy

## 2017-01-24 LAB — CBC
HEMATOCRIT: 28.5 % — AB (ref 39.0–52.0)
Hemoglobin: 8.9 g/dL — ABNORMAL LOW (ref 13.0–17.0)
MCH: 30.5 pg (ref 26.0–34.0)
MCHC: 31.2 g/dL (ref 30.0–36.0)
MCV: 97.6 fL (ref 78.0–100.0)
Platelets: 499 10*3/uL — ABNORMAL HIGH (ref 150–400)
RBC: 2.92 MIL/uL — ABNORMAL LOW (ref 4.22–5.81)
RDW: 16.8 % — AB (ref 11.5–15.5)
WBC: 12.4 10*3/uL — AB (ref 4.0–10.5)

## 2017-01-24 LAB — GLUCOSE, CAPILLARY
GLUCOSE-CAPILLARY: 144 mg/dL — AB (ref 65–99)
Glucose-Capillary: 221 mg/dL — ABNORMAL HIGH (ref 65–99)
Glucose-Capillary: 134 mg/dL — ABNORMAL HIGH (ref 65–99)
Glucose-Capillary: 252 mg/dL — ABNORMAL HIGH (ref 65–99)

## 2017-01-24 MED ORDER — MIDODRINE HCL 5 MG PO TABS
5.0000 mg | ORAL_TABLET | Freq: Every day | ORAL | Status: DC
  Administered 2017-01-25 – 2017-01-30 (×6): 5 mg via ORAL
  Filled 2017-01-24 (×6): qty 1

## 2017-01-24 NOTE — Progress Notes (Signed)
Patient ID: [name], male   DOB: June 18, 1940, 76 y.o.   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 aspirin  81 mg Oral Daily  . atorvastatin  80 mg Oral q1800  . calcium acetate  667 mg Oral TID WC  . carvedilol  12.5 mg Oral QHS  . clopidogrel  75 mg Oral Q breakfast  . [START ON 01/26/2017] darbepoetin (ARANESP) injection - NON-DIALYSIS  200 mcg Subcutaneous Q Thu-1800  . docusate sodium  100 mg Oral BID  . feeding supplement (PRO-STAT SUGAR FREE 64)  30 mL Oral BID  . gentamicin cream  1 application Topical BID  . gentamicin cream  1 application Topical Daily  . heparin subcutaneous  5,000 Units Subcutaneous Q8H  . insulin aspart  0-5 Units Subcutaneous QHS  . insulin aspart  0-9 Units Subcutaneous TID WC  . insulin NPH Human  60 Units Subcutaneous QHS   And  . insulin NPH Human  25 Units Subcutaneous QAC breakfast  . [START ON 01/25/2017] midodrine   5 mg Oral Q breakfast  . multivitamin  1 tablet Oral QHS  . protein supplement shake  2 oz Oral TID WC  . senna-docusate  1 tablet Oral QHS  . [START ON 02/04/2017] Vitamin D (Ergocalciferol)  50,000 Units Oral Q30 days   Dialysis Orders:  Center: [place] [facility]. 6 exchanges/day one mid-day exchange (occurs around 9am).  2.5 liters of 2.5% per CCPD 2 liters of 2.5% for last fill and mid day drain. Pt says EDW "about 202" (pre admission) Dr. Denny Peon  Assessment/ Plan:   1. 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 ESRD continue with CCPD 3. ABLA/Anemia of CKD stage 5: continue with Aranesp and transfuse prn 4. CKD-MBD: stable and continue with current regimen 5. Nutrition: renal diet 6. Hypertension: stable 7. Chest pain- s/p cath wtihout new occlusions or stenosis 8. Orthostatic hypotension- on midodrine 9. Disposition- continue with CIR  [name], MD [facility], [facility] Pager [phone] 01/24/2017, 11:43 AM

## 2017-01-24 NOTE — Progress Notes (Signed)
PD tx initiated via tenckhoff w/o problem, effluent clear yellow, VSS, report given to Vincente Poli, RN

## 2017-01-24 NOTE — Progress Notes (Signed)
Physical Therapy Note  Patient Details  Name: [name] MRN: [id number] Date of Birth: [birth year] Today's Date: 01/24/2017    Time: 1100-1200 60 minutes  1:1 Pt c/o pain in bilat LEs, medicated prior to session.  Sliding board transfers throughout session with +2 max A, cues for UE and LE placement and motor planning.  Lateral scoots on mat with max/total A with pt requiring manual facilitation for wt shifts and lifting.  Attempt w/c push ups, pt unable to put UEs on arm rests but able to attempt pushing up on seat of w/c.  Pt provided with longer arm rests to assist with scooting fwd/bkwd in w/c.   [name] 01/24/2017, 12:09 PM

## 2017-01-24 NOTE — Progress Notes (Signed)
Dressing changed today.  Right 5th toe amputation with some drainage, but does appear to be healing.  Will continue to monitor.  Pt is non-weight bearing to the incision site (OK to use heel and ball of foot)   [name]

## 2017-01-24 NOTE — Consult Note (Addendum)
WOC Nurse wound follow-up consult note Dr Lajoyce Corners following for assessment and plan of care to LLE  Reason for Consult: Reassessment of nose.Pt has been wearing Bipap anddeveloped medical-device related pressure injuries which were present on admission to rehab. Wound type: Bridge of nose with unstageable pressure injury which will eventually evolve into full thickness skin loss in 2 areas; upper nose .3X.2, dry scabbed linear area. Middle nose .8X.8, dark brown scabbed area which is beginning to lift in some patchy edges, revealing pink moist wound bed,no odor or drainage. Middle scab is tightly adhered to bridge of nose.   Dressing procedure/placement/frequency: Discussed plan of care with patient and wife at the bedside. Gentamycin ointment has already been ordered to promote moist healing, can be covered by bandaid during the day to promote moist healing to bridge of nose;  they have ordered a gel cushion from their home health agency to wear at night to add padding to nose when patient is wearing Bipap mask at night. Instructions provided for staff nurses. Recommend plastic consult for further input since wound is on his face and will evolve into full thickness tissue loss, please order if desired. Please re-consult if further assistance is needed.  Thank-you,  Cammie Mcgee MSN, RN, CWOCN, [place], CNS [phone]

## 2017-01-24 NOTE — Progress Notes (Signed)
Occupational Therapy Session Note  Patient Details  Name: [name] MRN: [id number] Date of Birth: 08-15-1940  Today's Date: 01/24/2017 OT Individual Time: 1003-1058 OT Individual Time Calculation (min): 55 min    Short Term Goals: Week 1:  OT Short Term Goal 1 (Week 1): Pt will transfer to toilet with mod assist squat pivot vs slide board OT Short Term Goal 2 (Week 1): Pt will perform sit to stand with mod A in prep for clothing management OT Short Term Goal 3 (Week 1): Pt will don pants with mod assist with lateral leans OT Short Term Goal 4 (Week 1): Pt will navigate w/c around room to obtain clothing for ADL with min A   Skilled Therapeutic Interventions/Progress Updates:    Treatment session with focus on bed mobility and sitting balance during self-care tasks.  Pt received supine in bed awake, but reporting pain in BLE.  Completed LB bathing and dressing at bed level with mod assist for rolling and total assist for LB dressing.  Pt requires max cues for sequencing, even to lift legs to allow therapist to thread pants.  UB bathing/dressing completed seated EOB with min-mod assist for sitting balance due to Lt lean.  Min cues for sequencing/problem solving during bathing/dressing tasks.  Completed transfer to w/c with use of Stedy; required +2 for sit > stand while pulling up on Stedy.  Transfer to w/c and completed grooming tasks at sink from w/c level.  Wife present and supportive throughout session.  Therapy Documentation Precautions:  Precautions Precautions: Fall Precaution Comments: PD port, VAC left residual limb Restrictions Weight Bearing Restrictions: Yes LLE Weight Bearing: Non weight bearing Pain:  Pt with c/o pain in Rt foot and Lt residual limb, premedicated.  See Function Navigator for Current Functional Status.   Therapy/Group: Individual Therapy  Rosalio Loud 01/24/2017, 12:17 PM

## 2017-01-24 NOTE — Progress Notes (Signed)
Occupational Therapy Session Note  Patient Details  Name: [name] MRN: [id number] Date of Birth: [birth year]  Today's Date: 01/24/2017 OT Individual Time: 1240-1340 OT Individual Time Calculation (min): 60 min     Skilled Therapeutic Interventions/Progress Updates: patient eating lunch upon approach for therapy session.   Half his tuna salad sandwich was sitting in the bowl placed on his lap in case he became nauseous.     He stated he had eaten half of it and did not want the other half.   Not sure if he was aware or concerned it was in his 'throw up' bowl.    He did complain of pain; so, he may have been more concerned with his pain than where the whereabouts of the half the sandwich he did not plan to eat.   Further, this clinician helped patient reposition his banana pudding onto this lap so that he could more easily access it instead of way high above and on the back of his tray.  He stated he needed to use the bathroom and then participated in the transfer (supine to edge of bed- max A and cues; edge of bed to standing in stedy - max Ax2;    stedy to correct positiong on toilet = extra time and mod A x2 to scoot back onto seat; toilet seat to standing in stedy = mod A x 2).   He was unable to tolerate standing on his right foot (pain and fatigue) and asked if we could help pull up his pants lying on his bed (He did not have a BM or void during the toileting attempt)  He required max A x2 for lateral rolls for pulling up pants once back on bed  * during session, patient appeared disoriented and confused during tasks asevidenced by not responding to questions or repeated asking, "huh?".   He wife stated she was concerned regarding this.   Patient pain may be a factor in his apparent slow processing and disorientation.  Patient left supine in bed with his wife seated close by     Therapy Documentation Precautions:  Precautions Precautions: Fall Precaution Comments: PD port, VAC  left residual limb Restrictions Weight Bearing Restrictions: Yes LLE Weight Bearing: Non weight bearing Pain:    See Function Navigator for Current Functional Status.   Therapy/Group: Individual Therapy  [name] 01/24/2017, 1:56 PM

## 2017-01-24 NOTE — Progress Notes (Signed)
[facility] PHYSICAL MEDICINE & REHABILITATION     PROGRESS NOTE  Subjective/Complaints:  Pt seen laying in bed this AM.  He slept well overnight.  Per wife, pt had a rough day of therapies yesterday.  Pt states he does not feel like eating, encouraged nutrition as medicine.   ROS: Denies nausea, vomiting, diarrhea, shortness of breath or chest pain   Objective: Vital Signs: Blood pressure (!) 148/48, pulse 76, temperature 98.2 F (36.8 C), temperature source Oral, resp. rate 18, height  (1.753 m), weight 71 kg (156 lb 8.4 oz), SpO2 97 %. No results found.  Recent Labs  01/23/17 0604  WBC 9.2  HGB 9.1*  HCT 29.6*  PLT 507*    Recent Labs  01/23/17 0604  NA 132*  K 3.2*  CL 90*  GLUCOSE 288*  BUN 62*  CREATININE 7.18*  CALCIUM 9.1   CBG (last 3)   Recent Labs  01/23/17 1654 01/23/17 2059 01/24/17 0558  GLUCAP 72 109* 221*    Wt Readings from Last 3 Encounters:  01/24/17 71 kg (156 lb 8.4 oz)  01/17/17 86.7 kg (191 lb 2.2 oz)  01/05/17 87.9 kg (193 lb 12.6 oz)    Physical Exam:  BP (!) 148/48 (BP Location: Right Arm)   Pulse 76   Temp 98.2 F (36.8 C) (Oral)   Resp 18   Ht  (1.753 m)   Wt 71 kg (156 lb 8.4 oz)   SpO2 97%   BMI 23.11 kg/m  Constitutional: He appears well-nourished. No distress. Obese. HENT: Normocephalic. Areas of abrasion healing.  Eyes: EOMI. No discharge.  Cardiovascular: RRR. No JVD . Respiratory: Effort normal and breath sounds normal.  GI: Soft. Bowel sounds are normal.   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  Motor: 4+/5 grossly throughout Skin:  Left BKA with dried blood Psychiatric: His speech is normal. He is withdrawn.   Assessment/Plan: 1. Functional deficits secondary to left BKA which require 3+ hours per day of interdisciplinary therapy in a comprehensive inpatient rehab setting. Physiatrist is providing close team supervision and  24 hour management of active medical problems listed below. Physiatrist and rehab team continue to assess barriers to discharge/monitor patient progress toward functional and medical goals.  Function:  Bathing Bathing position   Position: Other (comment) (EOB UB and bedlevel LB)  Bathing parts Body parts bathed by patient: Right arm, Left arm, Chest, Abdomen, Front perineal area, Left upper leg, Right upper leg, Right lower leg Body parts bathed by helper: Buttocks, Back  Bathing assist Assist Level:  (Mod assist)      Upper Body Dressing/Undressing Upper body dressing   What is the patient wearing?: Pull over shirt/dress     Pull over shirt/dress - Perfomed by patient: Thread/unthread right sleeve, Thread/unthread left sleeve, Put head through opening Pull over shirt/dress - Perfomed by helper: Pull shirt over trunk        Upper body assist Assist Level: Touching or steadying assistance(Pt > 75%)      Lower Body Dressing/Undressing Lower body dressing   What is the patient wearing?: Pants, Non-skid slipper socks   Underwear - Performed by helper: Thread/unthread right underwear leg, Thread/unthread left underwear leg, Pull underwear up/down   Pants- Performed by helper: Thread/unthread right pants leg, Thread/unthread left pants leg, Pull pants up/down   Non-skid slipper socks- Performed by helper: Don/doff right sock  Lower body assist Assist for lower body dressing:  (Total assist)      Toileting Toileting Toileting activity did not occur: Safety/medical concerns   Toileting steps completed by helper: Adjust clothing after toileting, Performs perineal hygiene, Adjust clothing prior to toileting Toileting Assistive Devices:  (slide board/Stedy)  Toileting assist Assist level: Two helpers   Transfers Chair/bed transfer   Chair/bed transfer method: Lateral scoot Chair/bed transfer assist level: 2 helpers Chair/bed transfer assistive device:  Sliding board, Armrests, Bedrails     Locomotion Ambulation Ambulation activity did not occur: Safety/medical concerns         Wheelchair   Type: Manual Max wheelchair distance: 30 Assist Level: Touching or steadying assistance (Pt > 75%)  Cognition Comprehension Comprehension assist level: Follows complex conversation/direction with extra time/assistive device  Expression Expression assist level: Expresses basic needs/ideas: With extra time/assistive device  Social Interaction Social Interaction assist level: Interacts appropriately with others with medication or extra time (anti-anxiety, antidepressant).  Problem Solving Problem solving assist level: Solves basic 90% of the time/requires cueing < 10% of the time  Memory Memory assist level: Complete Independence: No helper    Medical Problem List and Plan: 1. Functional deficitssecondary to left BKA  Cont CIR   May weight bear through the heel on the right side 2. DVT Prophylaxis/Anticoagulation: Pharmaceutical: Heparin 3. Pain Management: Tylenol or Celebrex prn. Unable to tolerate muscle relaxers or pain medications due to SE of tremors, confusion and hallucinations.  4. Mood: LCSW to follow for evaluation and support.  5. Neuropsych: This patient is not fullycapable of making decisions on his own behalf. 6. Skin/Wound Care: Routine pressure relief measures. Prevalon boot for right foot. Monitor daily for intake to promote healing. Protein supplement and vitamins to promote wound healing.  7. Fluids/Electrolytes/Nutrition: Monitor I/O. Lytes/CBC per PD protocol.   Requires encouragement for nutrition  Will consider meds tomorrow 8. Leucocytosis: WBC has fluctuated.   Monitor for fevers and other sign of infection.    WBCs 9.1 on 10/15  Cont to monitor 9. Hypotension: Monitor BP bid   Vitals:   01/24/17 0500 01/24/17 0708  BP: (!) 129/53 (!) 148/48  Pulse: 60 76  Resp: 20 18  Temp: 98.2 F (36.8 C) 98.2 F (36.8  C)  SpO2: 96% 97%   Midodrine decreased to daily on 10/16 10. ESRD on PD: Schedule PD in evening and early am--unhook by breakfast.  11. Anemia of chronic disease: On aranesp every week.   Hb 9.1 on 10/15  Cont to monitor 12. T2DM insulin dependent: Brittle diabetic who's hard to control in part due to inconsistent intake. Changed to Premier protein with meals.   NPH adjusted to 25u qam and 60u qpm CBG (last 3)   Recent Labs  01/23/17 1654 01/23/17 2059 01/24/17 0558  GLUCAP 72 109* 221*   Remains labile, encourage better nutrition 13. Constipation: Continue Colace bid with lactulose prn.  14. PVD with s/p left femoral pseudoaneurysm repair.   Prevalon boot for RLE with pressure relief measures.  15. Delirium: ?Resolved   LOS (Days) 7 A FACE TO FACE EVALUATION WAS PERFORMED  Ankit Karis Juba 01/24/2017 9:49 AM

## 2017-01-24 NOTE — Plan of Care (Signed)
Problem: RH Ambulation Goal: LTG Patient will ambulate in controlled environment (PT) LTG: Patient will ambulate in a controlled environment, # of feet with assistance (PT).  Outcome: Not Applicable Date Met: 56/31/49 D/c'd due to slow progress

## 2017-01-24 NOTE — Progress Notes (Signed)
Physical Therapy Session Note  Patient Details  Name: [name] MRN: [id number] Date of Birth: [birth year]  Today's Date: 01/24/2017 PT Individual Time: 4098-1191 PT Individual Time Calculation (min): 28 min   Short Term Goals: Week 1:  PT Short Term Goal 1 (Week 1): supervision supine<>sitting PT Short Term Goal 2 (Week 1): Pt to transfer bed<>w/c with sliding board and mod assist PT Short Term Goal 3 (Week 1): Pt to propel w/c X 75 feet with supervision  Skilled Therapeutic Interventions/Progress Updates:    Attempted to change out armrests of w/c but unable to locate matching ones that fit in this w/c or one in similar size - will continue to monitor as they become available. Focused on bed mobility re-training including supine <> sit, scooting EOB, and bridging in the bed for repositioning with mod to max verbal cues for sequencing and attention. Tactile cues for initiation of movement. Seated EOB engaged in seated LAQ and marches x 10 reps each BLE with cues to sustain attention during task.   Therapy Documentation Precautions:  Precautions Precautions: Fall Precaution Comments: PD port, VAC left residual limb Restrictions Weight Bearing Restrictions: Yes LLE Weight Bearing: Non weight bearing  Pain:  Denies pain.    See Function Navigator for Current Functional Status.   Therapy/Group: Individual Therapy  Karolee Stamps Darrol Poke, PT, DPT  01/24/2017, 3:31 PM

## 2017-01-25 ENCOUNTER — Inpatient Hospital Stay (HOSPITAL_COMMUNITY): Payer: Medicare Other | Admitting: Occupational Therapy

## 2017-01-25 ENCOUNTER — Inpatient Hospital Stay (HOSPITAL_COMMUNITY): Payer: Medicare Other

## 2017-01-25 ENCOUNTER — Encounter (HOSPITAL_COMMUNITY): Payer: Medicare Other | Admitting: Psychology

## 2017-01-25 DIAGNOSIS — F4321 Adjustment disorder with depressed mood: Secondary | ICD-10-CM

## 2017-01-25 LAB — GLUCOSE, CAPILLARY
GLUCOSE-CAPILLARY: 111 mg/dL — AB (ref 65–99)
Glucose-Capillary: 269 mg/dL — ABNORMAL HIGH (ref 65–99)
Glucose-Capillary: 91 mg/dL (ref 65–99)
Glucose-Capillary: 83 mg/dL (ref 65–99)

## 2017-01-25 MED ORDER — HEPARIN 1000 UNIT/ML FOR PERITONEAL DIALYSIS: 500.0000 [IU] | INTRAMUSCULAR | Status: DC | PRN

## 2017-01-25 MED ORDER — MIRTAZAPINE 15 MG PO TABS
15.0000 mg | ORAL_TABLET | Freq: Every day | ORAL | Status: DC
  Administered 2017-01-25 – 2017-01-30 (×6): 15 mg via ORAL
  Filled 2017-01-25 (×6): qty 1

## 2017-01-25 MED ORDER — GENTAMICIN SULFATE 0.1 % EX CREA: 1.0000 "application " | TOPICAL_CREAM | Freq: Every day | CUTANEOUS | Status: DC

## 2017-01-25 NOTE — Consult Note (Signed)
Neuropsychological Consultation   Patient:   Joshua [name]   DOB:   January 23, 1941  MR Number:  [id number]  [facility]  [facility] [address] [id number] [place] [id number] Dept: [phone] Loc: [phone]           Date of Service:   01/25/2017  Start Time:   3 PM End Time:   4 PM  Provider/Observer:  Arley PhenixJohn Rodenbough, Psy.D.       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. Hospital course complicated by N/V, leucocytosis and fever likely due to sepsis.   Initial surgery reqired left transmetatarsal amputation.  The patient continued to have more issues and left foot pain and skin breakdown.  Ultimately, the patient had L-BKA with referal to CIR for further therapy.  The patient has continued with arousal issues, fatique and confusion.  While he is improving in these areas they persist.  The patient is also having coping issues with loss of his left leg and worry about how that will impact his life going forward.  Reason for Service:  [name] was referred for neuropsychological consultation due to coping issues and depressive issues.  Below is the HPI for the current admission.  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. Hospital course complicated by N/V, leucocytosis and fever likely due to sepsis. GB ultrasound revealed GB polyp without acute cholecystitis. He has had  difficulty swallowing due to oral pharyngeal ulcers and [name] recommended supportive care for management. He has had issues with right groin wound dehiscence of wound with gangrenous changes left foot. 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 on 9/14 and right groin hematoma showed decrease in erythema.  He was admitted to CIR on 12/27/2016 due to deficits in mobility and decreased ability to carry out ADL tasks. His was noted to have hypotensive episodes with fatigue and frequently during the pm sessions. Nephrology has been following for management of PD and decreased amount of fluid removed with PD. Insulin has been titrated upwards slowly to prevent hypoglycemic episodes as blood sugars have been difficulty to control due to variable intake. He developed increase in pain left foot with ischemic changes and skin break down requiring surgical intervention. He was discharged to acute on 01/06/17 due to ongoing fevers, delirium with hallucinations, hypotension and concerns of developing sepsis. He did develop jaw pain radiating to chest on 9/30 with SEMI and elevated troponin. He was started on IV heparin and Cardiac cath did not show critical stenosis--he was felt to be moderate risk for surgery. He underwent left transtibial amputation on 10/03 by [name]. Therapy ongoing and patient with decreased balance, difficulty following simple command consistently as well as debility compounded by L-BKA. CIR recommended to resume rehab course.   Current Status:  The patient is having some adjustment issues and  question of depression vs issues from significant medical issues.  Patient likely has some small vessel disease to to medical history and recent head CT suggests atrophy and chronic microvascular white matter ischemic changes.   Behavioral Observation: [name]  presents as a 76 y.o.-year-old Right Caucasian Male who appeared his stated  age. his dress was Appropriate and he was Well Groomed and his manners were Appropriate to the situation.  his participation was indicative of Drowsy and Inattentive behaviors.  There were any physical disabilities noted.  he displayed an appropriate level of cooperation and motivation.     Interactions:    Active Appropriate, Drowsy and Inattentive  Attention:   abnormal and attention span appeared shorter than expected for age  Memory:   abnormal; remote memory intact, recent memory impaired  Visuo-spatial:  not examined  Speech (Volume):  low  Speech:   normal; slowed  Thought Process:  Coherent and slowed  Though Content:  WNL; not suicidal  Orientation:   person, place, time/date and situation  Judgment:   Fair  Planning:   Fair  Affect:    Blunted, Flat and Lethargic  Mood:    Depressed and Dysphoric  Insight:   Fair  Intelligence:   normal  Medical History:   Past Medical History:  Diagnosis Date  . AICD (automatic cardioverter/defibrillator) present    [employer] [id number] lead [id number]  . CHF (congestive heart failure) (HCC)   . Complication of anesthesia   . COPD (chronic obstructive pulmonary disease) (HCC)   . Coronary artery disease    CABG 2011  . Diabetes mellitus without complication (HCC)   . ESRD on peritoneal dialysis [facility])    Started PD Dec 2016 in [place]. Nephrology is in [place].   Joshua Kitchen History of peritonitis    PD cath related peritonitis in April 2017  . Hypertension   . Peripheral vascular disease (HCC)   . PONV (postoperative nausea and vomiting)   . Sjogren's syndrome (HCC)    Per pt's wife, was diagnosed in 2016 during w/u for cause of renal failure prior to starting dialysis.  He had a rash on his chest apparently prompting this w/u.  Never had renal bx.  He was referred to a rheum MD in [place] and per the wife he was treated with MTX and other medications but had side effects to "all of it" and isn't taking anything  for this as of Jun 2017.   . Stroke (HCC)    Abuse/Trauma History: The patient has had deteriating health and now has had BKA (left)  Psychiatric History:  Patient some issues with adjustment (Depression vs SVD)  Family Med/Psych History:  Family History  Problem Relation Age of Onset  . 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 Randie Heinz. Hospital course complicated by N/V, leucocytosis and fever likely due to sepsis.   Initial surgery reqired left transmetatarsal amputation.  The patient continued to have more issues and left foot pain and skin breakdown.  Ultimately, the patient had L-BKA with referal to CIR for further therapy.  The patient has continued with arousal issues, fatique and confusion.  While he is improving in these areas they persist.  The patient is also having coping issues with loss of his left leg and worry about how that will impact his life going forward.  The patient is having some adjustment issues and question of depression vs issues from significant medical issues.  Patient likely has some small vessel disease to to medical history and recent head CT suggests atrophy and chronic microvascular white matter ischemic changes.   Disposition/Plan:  I will follow-up with patient next week to further assess issues (Depression vs Cognitive effects of medical status including SVD)  Diagnosis:    Unilateral complete BKA, right, sequela (HCC)         Electronically Signed   _______________________ Arley Phenix, Psy.D.

## 2017-01-25 NOTE — Progress Notes (Signed)
Patient ID: [name], male   DOB: 27-Apr-1940, 76 y.o.   MRN: [id number]  [facility] Progress Note    Subjective:   Reports anorexia, nausea and vomiting over the last 2 days as well as dizziness.   Objective:   BP (!) 125/58 (BP Location: Right Arm)   Pulse 80   Temp 98 F (36.7 C) (Oral)   Resp 16   Ht 5\' 9"  (1.753 m)   Wt 72.6 kg (160 lb 0.1 oz)   SpO2 100%   BMI 23.63 kg/m   Intake/Output: I/O last 3 completed shifts: In: [id number] [P.O.:700; Other:12497] Out: [id number] [Other:13751]   Intake/Output this shift:  Total I/O In: 12615 [P.O.:120; Other:12495] Out: [id number] [Other:13666] Weight change: -22.6 kg (-49 lb 13.2 oz)  Physical Exam: Gen:NAD CVS: no rub Resp:cta OZH:YQMVHQAbd:benign Ext: no edema, s/p LBKA, LUE AVF +T/B  Labs: BMET  Recent Labs Lab 01/23/17 0604  NA 132*  K 3.2*  CL 90*  CO2 28  GLUCOSE 288*  BUN 62*  CREATININE 7.18*  ALBUMIN 1.5*  CALCIUM 9.1  PHOS 3.8   CBC  Recent Labs Lab 01/23/17 0604 01/24/17 1401  WBC 9.2 12.4*  HGB 9.1* 8.9*  HCT 29.6* 28.5*  MCV 98.7 97.6  PLT 507* 499*    @IMGRELPRIORS @ Medications:    . aspirin  81 mg Oral Daily  . atorvastatin  80 mg Oral q1800  . calcium acetate  667 mg Oral TID WC  . carvedilol  12.5 mg Oral QHS  . clopidogrel  75 mg Oral Q breakfast  . [START ON 01/26/2017] darbepoetin (ARANESP) injection - NON-DIALYSIS  200 mcg Subcutaneous Q Thu-1800  . docusate sodium  100 mg Oral BID  . feeding supplement (PRO-STAT SUGAR FREE 64)  30 mL Oral BID  . gentamicin cream  1 application Topical BID  . gentamicin cream  1 application Topical Daily  . heparin subcutaneous  5,000 Units Subcutaneous Q8H  . insulin aspart  0-5 Units Subcutaneous QHS  . insulin aspart  0-9 Units Subcutaneous TID WC  . insulin NPH Human  60 Units Subcutaneous QHS   And  . insulin NPH Human  25 Units Subcutaneous QAC breakfast  . midodrine  5 mg Oral Q breakfast  . mirtazapine  15 mg Oral QHS  .  multivitamin  1 tablet Oral QHS  . protein supplement shake  2 oz Oral TID WC  . senna-docusate  1 tablet Oral QHS  . [START ON 02/04/2017] Vitamin D (Ergocalciferol)  50,000 Units Oral Q30 days   Dialysis Orders:  [facility] 6 exchanges/day one mid-day exchange (occurs around 9am).  2.5 liters of 2.5% per [facility] 2 liters of 2.5% for last fill and mid day drain. Pt says EDW "about 202" (pre admission) Dr. Denny PeonBaveja  Assessment/ Plan:   1. 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 ESRD continue with [facility] but due to dizziness and poor po intake will change PD fluid to 1/2 1.5% and 2.5% so as not to UF further.   3. Nausea/vomiting- possibly related to narcotics may want to consider phenergan or zofran before meals. Also could be diabetic gastroparesis 4. 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 DM- per primary 7. Nutrition: renal diet 8. Hypertension: stable 9. Chest pain- s/p cath wtihout  new occlusions or stenosis 10. 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 Disposition- continue with CIR  [name], MD [facility], [facility] Pager [phone] 01/25/2017, 1:26 PM

## 2017-01-25 NOTE — Significant Event (Signed)
Hypoglycemic Event  CBG: 54  Treatment: 15 GM carbohydrate snack  Symptoms: None  Follow-up CBG: Time:1804          CBG Result:83  Possible Reasons for Event: Inadequate meal intake  Comments/MD notified:Pt able to tolerate meal w/o n/v, CBG checked late due to sterile procedure with PD    Scot JunKaren E Ersie Savino

## 2017-01-25 NOTE — Plan of Care (Signed)
Problem: RH SKIN INTEGRITY Goal: RH STG SKIN FREE OF INFECTION/BREAKDOWN Patients skin will remain free from further breakdown or infection with mod assist.  Outcome: Not Progressing Pt has pressure wound to nose and excoriation to buttocks

## 2017-01-25 NOTE — Progress Notes (Signed)
Occupational Therapy Weekly Progress Note  Patient Details  Name: [name] MRN: [id number] Date of Birth: 13-Jun-1940  Beginning of progress report period: January 18, 2017 End of progress report period: January 25, 2017  Today's Date: 01/25/2017 OT Individual Time: 0930-1030 and 1350-1430 OT Individual Time Calculation (min): 60 min and 40 min   Patient has met 2 of 4 short term goals.  Pt is making slow progress towards goals.  Pt currently requires up to total assist +2 for LB dressing at bed level and slide board transfers.  Pt missed a day of therapy due to amputation of 5th digit on Rt foot, which is causing increased pain with mobility.  Pt requires increased time and cues for sequencing and completion of tasks due to deconditioning as well as ?decreased motivation.  Pt would benefit from neuropsych for coping.  Have changed pt to 15/7 to allow increased rest breaks and improve participation.    Patient continues to demonstrate the following deficits: muscle weakness, decreased cardiorespiratoy endurance and decreased standing balance, decreased postural control and decreased balance strategies and therefore will continue to benefit from skilled OT intervention to enhance overall performance with BADL and Reduce care partner burden.  Patient progressing toward long term goals..  Continue plan of care.  OT Short Term Goals Week 1:  OT Short Term Goal 1 (Week 1): Pt will transfer to toilet with mod assist squat pivot vs slide board OT Short Term Goal 1 - Progress (Week 1): Progressing toward goal OT Short Term Goal 2 (Week 1): Pt will perform sit to stand with mod A in prep for clothing management OT Short Term Goal 2 - Progress (Week 1): Not met OT Short Term Goal 3 (Week 1): Pt will don pants with mod assist with lateral leans OT Short Term Goal 3 - Progress (Week 1): Met OT Short Term Goal 4 (Week 1): Pt will navigate w/c around room to obtain clothing for ADL with min A  OT  Short Term Goal 4 - Progress (Week 1): Met Week 2:  OT Short Term Goal 1 (Week 2): Pt will transfer to drop arm commode with slide board mod assist OT Short Term Goal 2 (Week 2): Pt will perform sit to stand with mod A in prep for clothing management OT Short Term Goal 3 (Week 2): Pt will don pants in sitting with lateral leans to pull pants over hips post toileting with min assist  Skilled Therapeutic Interventions/Progress Updates:    1) Treatment session with focus on self-care retraining and transfers.  Pt received upright at sink completing grooming tasks in handoff from another OT.  Completed shaving without assist.  UB bathing and dressing completed with setup for items and tactile cues for anterior weight shift to adjust shirt over back.  Pt propelled w/c 30' with mod cues for sequencing, awareness, and problem solving.  Engaged in transfers with focus on anterior weight shift and head/hips relationship.  Required +2 assist transfer to Lt to therapy mat, max assist when returning to Rt into w/c.  Encouraged pt to stay upright in w/c.  2) Treatment session with focus on anterior weight shift to decrease burden of care with sit > stand and transfers.  Engaged in w/c pushups with pt able to complete 3 sets of 5 with prolonged rest breaks between each set. Attempted sit > partial stand from w/c with therapist seated in arm chair in front of pt to facilitate anterior weight shift, pt able to clear  buttocks with mod-max assist.  During final trial, returned pt hands to w/c arm rests with pt able to lift buttocks from chair with max assist for small clearance to aid in transfers while maintaining skin integrity on buttocks.  Engaged in prolonged discussion about pt goals to increase participation and motivation.  Therapy Documentation Precautions:  Precautions Precautions: Fall Precaution Comments: PD port, VAC left residual limb Restrictions Weight Bearing Restrictions: Yes LLE Weight Bearing: Non  weight bearing General:   Vital Signs: Therapy Vitals Pulse Rate: 71 Resp: 16 BP: (!) 109/47 Patient Position (if appropriate): Sitting Oxygen Therapy SpO2: 100 % O2 Device: Not Delivered Pain:  Pt with c/o pain in Lt residual limb and Rt foot, premedicated.  Repositioned throughout session.  See Function Navigator for Current Functional Status.   Therapy/Group: Individual Therapy  Simonne Come 01/25/2017, 3:42 PM

## 2017-01-25 NOTE — Progress Notes (Signed)
Occupational Therapy Session Note  Patient Details  Name: Joshua KitchenJames D Alioto MRN: [id number] Date of Birth: Sep 03, 1940  Today's Date: 01/25/2017 OT Individual Time: 0900-0930 OT Individual Time Calculation (min): 30 min    Short Term Goals: Week 1:  OT Short Term Goal 1 (Week 1): Pt will transfer to toilet with mod assist squat pivot vs slide board OT Short Term Goal 2 (Week 1): Pt will perform sit to stand with mod A in prep for clothing management OT Short Term Goal 3 (Week 1): Pt will don pants with mod assist with lateral leans OT Short Term Goal 4 (Week 1): Pt will navigate w/c around room to obtain clothing for ADL with min A   Skilled Therapeutic Interventions/Progress Updates:    1:1 Pt in bed when arrived. Focus on threading pants in bed with HOB elevated. Pt still requires min A for clothing management while threading. Pt rolled in bed with cues to pull up pants. Pt came to EOB with bed rails with min A . Slide board transfer with mod A with more than reasonable amt of itme and max cues for forward weight shift. Focus on propelling self over to dresser to pick out rest of clothing and then to sink to wash face and shave with setup. Pt handed off to next therapist.   Therapy Documentation Precautions:  Precautions Precautions: Fall Precaution Comments: PD port, VAC left residual limb Restrictions Weight Bearing Restrictions: Yes LLE Weight Bearing: Non weight bearing Pain:  10/10 pain in right foot - RN aware and brought meds. See Function Navigator for Current Functional Status.   Therapy/Group: Individual Therapy  Roney MansSmith, Latravis Grine [facility] 01/25/2017, 11:32 AM

## 2017-01-25 NOTE — Progress Notes (Signed)
Physical Therapy Weekly Progress Note  Patient Details  Name: [name] MRN: [id number] Date of Birth: [birth year]  Beginning of progress report period: 01/17/17 End of progress report period: 01/25/17 Today's Date: 01/25/2017 PT Individual Time: 1115-1200; 1300-1330 PT Individual Time Calculation (min): 45 min , 30 min  Patient has met 0 of 4 short term goals.  He has partly met 3 STGS, as he is able to move sit> supine with supervision, at times perform slide board transfer with mod assist, and propels w/c with supervision but is not able to go more than 50' before needing to rest.  Patient continues to demonstrate the following deficits muscle weakness and muscle joint tightness, decreased cardiorespiratoy endurance, decreased problem solving and decreased memory and decreased sitting balance, decreased standing balance and decreased balance strategies and therefore will continue to benefit from skilled PT intervention to increase functional independence with mobility.  Patient progressing toward long term goals..  Continue plan of care.  PT Short Term Goals Week 1:  PT Short Term Goal 1 (Week 1): supervision supine<>sitting PT Short Term Goal 2 (Week 1): Pt to transfer bed<>w/c with sliding board and mod assist PT Short Term Goal 3 (Week 1): Pt to propel w/c X 75 feet with supervision Week 2:     Skilled Therapeutic Interventions/Progress Updates:   tx 1:  seated Therapeutic exercise performed with LE to increase strength for functional mobility.: L quad set, R long arc quad, R ankle DF, bil hip abd, R hip flex, L short arc quad knee ext,  10 x 1 each. Slide board transfer w/c> mat slightly uphill to R +2 due to tendency to slide forward; mat> w/c slightly downhill mod assist. .  Max cues for technique; pt able to demonstrate head hips relationship somewhat during transfer.  Problem solving is generally poor for mobility.  Use of press up blocks to improve elevation of hips for  transfers, x 5, but pt unable to elevate hips off of mat; he is fearful of leaning forward. PT instructed pt for increased efficiency for w/c propulsion on straight path and turns; he stated longer strokes hurt his shoulders, and was not able to retain information for turns.  Pt propelled 50' before tiring.  Pt left resting in w/c with all needs within reach. Wife present in room.  tx 2:  Discussed home bed ht with pt and wife; she stated bed is high, but has lost previous measurements.  PT asked her to have family measure again.  Pt may need hospital bed at home.  Squat pivot to R to slightly higher surface, max assist.  Pt has poor elevation of hips. Sit> supine with supervision, supine> sit to R with max assist due to poor functional movement of LLE.  In supine, strengthening ex: R bridging x 10 with UE use , x 10 with bil UEs on ches; L residual limb supported on folded towel. Squat pivot +2 mat> w/c due to Roho folding under him due to poor hip clearance. Pt left resting in w/c with q all needs within reach; step daughter in room.  PT reviewed with step daughter her need to call for staff if she goes to BR or leaves room.      Therapy Documentation Precautions:  Precautions Precautions: Fall Precaution Comments: PD port, VAC left residual limb Restrictions Weight Bearing Restrictions: Yes LLE Weight Bearing: Non weight bearing   Vital Signs: Therapy Vitals Temp: 98 F (36.7 C) Temp Source: Oral Pulse Rate: 80 Resp:  16 BP: (!) 125/58 Patient Position (if appropriate): Lying Oxygen Therapy SpO2: 100 % O2 Device: CPAP Pain: Pain Assessment Pain Assessment: 9/10 LLE and R foot surgical pain; premedicated; later in session pt rated pain 4/10.   See Function Navigator for Current Functional Status.  Therapy/Group: Individual Therapy  Jyssica Rief 01/25/2017, 8:00 AM

## 2017-01-25 NOTE — Progress Notes (Signed)
Occupational Therapy Note  Patient Details  Name: Joshua [name] MRN: [id number] Date of Birth: [birth year]  Pt's plan of care adjusted to 15/7 after speaking with care team and discussed with MD in team conference as pt currently unable to tolerate current therapy schedule with OT and PT.     Rosalio LoudHOXIE, Virgil Slinger 01/25/2017, 2:48 PM

## 2017-01-25 NOTE — Progress Notes (Signed)
[facility]ILITATION     PROGRESS NOTE  Subjective/Complaints:  Patient seen lying in bed this morning. Wife at bedside. She notes patient slept well overnight. Patient states he doesn't feel good, "just don't". Wife reports continued labile/poor appetite.  ROS: Denies nausea, vomiting, diarrhea, shortness of breath or chest pain   Objective: Vital Signs: Blood pressure (!) 125/58, pulse 80, temperature 98 F (36.7 C), temperature source Oral, resp. rate 16, height 5\' 9"  (1.753 m), weight 72.6 kg (160 lb 0.1 oz), SpO2 100 %. No results found.  Recent Labs  01/23/17 0604 01/24/17 1401  WBC 9.2 12.4*  HGB 9.1* 8.9*  HCT 29.6* 28.5*  PLT 507* 499*    Recent Labs  01/23/17 0604  NA 132*  K 3.2*  CL 90*  GLUCOSE 288*  BUN 62*  CREATININE 7.18*  CALCIUM 9.1   CBG (last 3)   Recent Labs  01/24/17 2105 01/25/17 0633 01/25/17 1213  GLUCAP 252* 269* 91    Wt Readings from Last 3 Encounters:  01/25/17 72.6 kg (160 lb 0.1 oz)  01/17/17 86.7 kg (191 lb 2.2 oz)  01/05/17 87.9 kg (193 lb 12.6 oz)    Physical Exam:  BP (!) 125/58 (BP Location: Right Arm)   Pulse 80   Temp 98 F (36.7 C) (Oral)   Resp 16   Ht 5\' 9"  (1.753 m)   Wt 72.6 kg (160 lb 0.1 oz)   SpO2 100%   BMI 23.63 kg/m  Constitutional: He appears well-nourished. No distress. Obese. HENT: Normocephalic. Areas of abrasion healing.  Eyes: EOMI. No discharge.  Cardiovascular: RRR. No JVD . Respiratory: Effort normal and breath sounds normal.  GI: Soft. Bowel sounds are normal.   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  Motor: 4+/5 grossly throughout Skin:  Incisions C/D/I. Psychiatric: His speech is normal. He is withdrawn.   Assessment/Plan: 1. Functional deficits secondary to left BKA which require 3+ hours per day of interdisciplinary therapy in a comprehensive inpatient rehab setting. Physiatrist  is providing close team supervision and 24 hour management of active medical problems listed below. Physiatrist and rehab team continue to assess barriers to discharge/monitor patient progress toward functional and medical goals.  Function:  Bathing Bathing position   Position: Bed (bedlevel for LB, seated EOB for UB)  Bathing parts Body parts bathed by patient: Right arm, Left arm, Chest, Abdomen, Front perineal area, Left upper leg, Right upper leg Body parts bathed by helper: Buttocks, Right lower leg, Back  Bathing assist Assist Level:  (Mod assist)      Upper Body Dressing/Undressing Upper body dressing   What is the patient wearing?: Pull over shirt/dress     Pull over shirt/dress - Perfomed by patient: Thread/unthread right sleeve, Thread/unthread left sleeve, Put head through opening Pull over shirt/dress - Perfomed by helper: Pull shirt over trunk        Upper body assist Assist Level: Touching or steadying assistance(Pt > 75%)      Lower Body Dressing/Undressing Lower body dressing   What is the patient wearing?: Pants, Non-skid slipper socks Underwear - Performed by patient: Pull underwear up/down Underwear - Performed by helper: Thread/unthread right underwear leg, Thread/unthread left underwear leg   Pants- Performed by helper: Thread/unthread right pants leg, Thread/unthread left pants leg, Pull pants up/down   Non-skid slipper socks- Performed by helper: Don/doff right sock       Shoes - Performed by helper:  Don/doff right shoe          Lower body assist Assist for lower body dressing: Touching or steadying assistance (Pt > 75%)      Toileting Toileting Toileting activity did not occur: Safety/medical concerns   Toileting steps completed by helper: Adjust clothing after toileting, Performs perineal hygiene, Adjust clothing prior to toileting Toileting Assistive Devices:  (slide board/Stedy)  Toileting assist Assist level: Two helpers    Transfers Chair/bed transfer   Chair/bed transfer method: Lateral scoot Chair/bed transfer assist level: Moderate assist (Pt 50 - 74%/lift or lower) Chair/bed transfer assistive device: Sliding board Mechanical lift: Stedy   Locomotion Ambulation Ambulation activity did not occur: Safety/medical concerns         Wheelchair   Type: Manual Max wheelchair distance: 30 Assist Level: Touching or steadying assistance (Pt > 75%)  Cognition Comprehension Comprehension assist level: Follows complex conversation/direction with extra time/assistive device  Expression Expression assist level: Expresses basic needs/ideas: With extra time/assistive device  Social Interaction Social Interaction assist level: Interacts appropriately with others with medication or extra time (anti-anxiety, antidepressant).  Problem Solving Problem solving assist level: Solves basic 90% of the time/requires cueing < 10% of the time  Memory Memory assist level: Complete Independence: No helper    Medical Problem List and Plan: 1. Functional deficitssecondary to left BKA  Cont CIR   May weight bear through the heel on the right side 2. DVT Prophylaxis/Anticoagulation: Pharmaceutical: Heparin 3. Pain Management: Tylenol or Celebrex prn. Unable to tolerate muscle relaxers or pain medications due to SE of tremors, confusion and hallucinations.  4. Mood: LCSW to follow for evaluation and support.  5. Neuropsych: This patient is not fullycapable of making decisions on his own behalf. 6. Skin/Wound Care: Routine pressure relief measures. Prevalon boot for right foot. Monitor daily for intake to promote healing. Protein supplement and vitamins to promote wound healing.  7. Fluids/Electrolytes/Nutrition: Monitor I/O. Lytes/CBC per PD protocol.   Requires encouragement for nutrition  Remeron 15 mg started on 10/17 8. Leucocytosis: WBC has fluctuated.   Monitor for fevers and other sign of infection.    WBCs 12.4 on  10/16  Cont to monitor 9. Hypotension: Monitor BP bid   Vitals:   01/24/17 2031 01/25/17 0611  BP: (!) 140/44 (!) 125/58  Pulse: 90 80  Resp:  16  Temp:  98 F (36.7 C)  SpO2:  100%   Midodrine decreased to daily on 10/16  Improved, will consider further mentions tomorrow 10. ESRD on PD: Schedule PD in evening and early am--unhook by breakfast.  11. Anemia of chronic disease: On aranesp every week.   Hb 8.9 on 10/16  Cont to monitor 12. T2DM insulin dependent: Brittle diabetic who's hard to control in part due to inconsistent intake. Changed to Premier protein with meals.   NPH adjusted to 25u qam and 60u qpm CBG (last 3)   Recent Labs  01/24/17 2105 01/25/17 0633 01/25/17 1213  GLUCAP 252* 269* 91   Remains labile, encourage better nutrition 13. Constipation: Continue Colace bid with lactulose prn.  14. PVD with s/p left femoral pseudoaneurysm repair.   Prevalon boot for RLE with pressure relief measures.  15. Delirium: ?Resolved   LOS (Days) 8 A FACE TO FACE EVALUATION WAS PERFORMED  Ankit Karis Juba 01/25/2017 1:03 PM

## 2017-01-26 ENCOUNTER — Inpatient Hospital Stay (HOSPITAL_COMMUNITY): Payer: Medicare Other | Admitting: Occupational Therapy

## 2017-01-26 ENCOUNTER — Inpatient Hospital Stay (HOSPITAL_COMMUNITY): Payer: Medicare Other | Admitting: Physical Therapy

## 2017-01-26 ENCOUNTER — Telehealth (INDEPENDENT_AMBULATORY_CARE_PROVIDER_SITE_OTHER): Payer: Self-pay | Admitting: Radiology

## 2017-01-26 DIAGNOSIS — E639 Nutritional deficiency, unspecified: Secondary | ICD-10-CM

## 2017-01-26 DIAGNOSIS — E109 Type 1 diabetes mellitus without complications: Secondary | ICD-10-CM

## 2017-01-26 DIAGNOSIS — F4321 Adjustment disorder with depressed mood: Secondary | ICD-10-CM

## 2017-01-26 LAB — RENAL FUNCTION PANEL
ALBUMIN: 1.5 g/dL — AB (ref 3.5–5.0)
Albumin: 1.6 g/dL — ABNORMAL LOW (ref 3.5–5.0)
Anion gap: 12 (ref 5–15)
Anion gap: 13 (ref 5–15)
BUN: 72 mg/dL — AB (ref 6–20)
BUN: 73 mg/dL — AB (ref 6–20)
CHLORIDE: 88 mmol/L — AB (ref 101–111)
CO2: 29 mmol/L (ref 22–32)
CO2: 29 mmol/L (ref 22–32)
CREATININE: 7.46 mg/dL — AB (ref 0.61–1.24)
Calcium: 8.3 mg/dL — ABNORMAL LOW (ref 8.9–10.3)
Calcium: 8.3 mg/dL — ABNORMAL LOW (ref 8.9–10.3)
Chloride: 89 mmol/L — ABNORMAL LOW (ref 101–111)
Creatinine, Ser: 7.28 mg/dL — ABNORMAL HIGH (ref 0.61–1.24)
GFR calc Af Amer: 8 mL/min — ABNORMAL LOW (ref >60)
GFR calc Af Amer: 7 mL/min — ABNORMAL LOW (ref >60)
GFR, EST NON AFRICAN AMERICAN: 6 mL/min — AB (ref >60)
GFR, EST NON AFRICAN AMERICAN: 6 mL/min — AB (ref >60)
Glucose, Bld: 226 mg/dL — ABNORMAL HIGH (ref 65–99)
Glucose, Bld: 181 mg/dL — ABNORMAL HIGH (ref 65–99)
PHOSPHORUS: 4.7 mg/dL — AB (ref 2.5–4.6)
POTASSIUM: 3.6 mmol/L (ref 3.5–5.1)
POTASSIUM: 3.6 mmol/L (ref 3.5–5.1)
Phosphorus: 4.9 mg/dL — ABNORMAL HIGH (ref 2.5–4.6)
Sodium: 129 mmol/L — ABNORMAL LOW (ref 135–145)
Sodium: 131 mmol/L — ABNORMAL LOW (ref 135–145)

## 2017-01-26 LAB — GLUCOSE, CAPILLARY
GLUCOSE-CAPILLARY: 54 mg/dL — AB (ref 65–99)
GLUCOSE-CAPILLARY: 179 mg/dL — AB (ref 65–99)
GLUCOSE-CAPILLARY: 42 mg/dL — AB (ref 65–99)
GLUCOSE-CAPILLARY: 43 mg/dL — AB (ref 65–99)
GLUCOSE-CAPILLARY: 48 mg/dL — AB (ref 65–99)
GLUCOSE-CAPILLARY: 72 mg/dL (ref 65–99)
Glucose-Capillary: 98 mg/dL (ref 65–99)
Glucose-Capillary: 168 mg/dL — ABNORMAL HIGH (ref 65–99)

## 2017-01-26 NOTE — Significant Event (Signed)
Hypoglycemic Event  CBG: 43  Treatment: 15 GM carbohydrate snack  Symptoms: None  Follow-up CBG: Time:1650 CBG Result:48  Possible Reasons for Event: Unknown  Comments/MD notified:additional carbs provided    Joshua JunKaren E Suda [name]

## 2017-01-26 NOTE — Progress Notes (Signed)
Patient ID: Joshua KitchenJames D Ebanks, male   DOB: 1940-07-21, 76 y.o.   MRN: [id number] Examination of patient's left transtibial amputation there is a small amount of residual draining most likely from resolving hematoma. There is no cellulitis. The wound edges are well approximated. We will write orders to remove the staples patient should continue with the stump shrinker ideally against the skin with an ABD on the outside of the dressing to collect any drainage.

## 2017-01-26 NOTE — Progress Notes (Signed)
Patient ID: [name], male   DOB: 1940-04-29, 76 y.o.   MRN: [id number] S: Feels a little better today without N/V O:BP (!) 143/57 (BP Location: Right Arm)   Pulse 86   Temp 99.2 F (37.3 C) (Oral)   Resp 16   Ht 5\' 9"  (1.753 m)   Wt 72 kg (158 lb 11.7 oz)   SpO2 100%   BMI 23.44 kg/m   Intake/Output Summary (Last 24 hours) at 01/26/17 1200 Last data filed at 01/25/17 1900  Gross per 24 hour  Intake              420 ml  Output                0 ml  Net              420 ml   Intake/Output: I/O last 3 completed shifts: In: 47829 [P.O.:545; Other:12495] Out: 13666 [Other:13666]  Intake/Output this shift:  No intake/output data recorded. Weight change: 1 kg (2 lb 3.3 oz) Gen: NAD CVS:no rub Resp:cta FAO:ZHYQMV Ext:no edema, s/p LBKA   Recent Labs Lab 01/23/17 0604 01/26/17 0612 01/26/17 0851  NA 132* 129* 131*  K 3.2* 3.6 3.6  CL 90* 88* 89*  CO2 28 29 29   GLUCOSE 288* 226* 181*  BUN 62* 72* 73*  CREATININE 7.18* 7.28* 7.46*  ALBUMIN 1.5* 1.6* 1.5*  CALCIUM 9.1 8.3* 8.3*  PHOS 3.8 4.9* 4.7*   Liver Function Tests:  Recent Labs Lab 01/23/17 0604 01/26/17 0612 01/26/17 0851  ALBUMIN 1.5* 1.6* 1.5*   No results for input(s): LIPASE, AMYLASE in the last 168 hours. No results for input(s): AMMONIA in the last 168 hours. CBC:  Recent Labs Lab 01/23/17 0604 01/24/17 1401  WBC 9.2 12.4*  HGB 9.1* 8.9*  HCT 29.6* 28.5*  MCV 98.7 97.6  PLT 507* 499*   Cardiac Enzymes: No results for input(s): CKTOTAL, CKMB, CKMBINDEX, TROPONINI in the last 168 hours. CBG:  Recent Labs Lab 01/25/17 1213 01/25/17 1655 01/25/17 1804 01/25/17 2049 01/26/17 0648  GLUCAP 91 54* 83 111* 179*    Iron Studies: No results for input(s): IRON, TIBC, TRANSFERRIN, FERRITIN in the last 72 hours. Studies/Results: No results found. [name] aspirin  81 mg Oral Daily  . atorvastatin  80 mg Oral q1800  . calcium acetate  667 mg Oral TID WC  . carvedilol  12.5 mg Oral QHS  .  clopidogrel  75 mg Oral Q breakfast  . darbepoetin (ARANESP) injection - NON-DIALYSIS  200 mcg Subcutaneous Q Thu-1800  . docusate sodium  100 mg Oral BID  . feeding supplement (PRO-STAT SUGAR FREE 64)  30 mL Oral BID  . gentamicin cream  1 application Topical BID  . gentamicin cream  1 application Topical Daily  . heparin subcutaneous  5,000 Units Subcutaneous Q8H  . insulin aspart  0-5 Units Subcutaneous QHS  . insulin aspart  0-9 Units Subcutaneous TID WC  . insulin NPH Human  60 Units Subcutaneous QHS   And  . insulin NPH Human  25 Units Subcutaneous QAC breakfast  . midodrine  5 mg Oral Q breakfast  . mirtazapine  15 mg Oral QHS  . multivitamin  1 tablet Oral QHS  . protein supplement shake  2 oz Oral TID WC  . senna-docusate  1 tablet Oral QHS  . [START ON 02/04/2017] Vitamin D (Ergocalciferol)  50,000 Units Oral Q30 days    BMET    Component Value Date/Time  NA 131 (L) 01/26/2017 0851   K 3.6 01/26/2017 0851   CL 89 (L) 01/26/2017 0851   CO2 29 01/26/2017 0851   GLUCOSE 181 (H) 01/26/2017 0851   BUN 73 (H) 01/26/2017 0851   CREATININE 7.46 (H) 01/26/2017 0851   CALCIUM 8.3 (L) 01/26/2017 0851   GFRNONAA 6 (L) 01/26/2017 0851   GFRAA 7 (L) 01/26/2017 0851   CBC    Component Value Date/Time   WBC 12.4 (H) 01/24/2017 1401   RBC 2.92 (L) 01/24/2017 1401   HGB 8.9 (L) 01/24/2017 1401   HCT 28.5 (L) 01/24/2017 1401   PLT 499 (H) 01/24/2017 1401   MCV 97.6 01/24/2017 1401   MCH 30.5 01/24/2017 1401   MCHC 31.2 01/24/2017 1401   RDW 16.8 (H) 01/24/2017 1401   LYMPHSABS 1.3 01/18/2017 0624   MONOABS 1.0 01/18/2017 0624   EOSABS 0.2 01/18/2017 0624   BASOSABS 0.0 01/18/2017 [id number]    Dialysis Orders:  [facility] 6 exchanges/day one mid-day exchange (occurs around 9am).  2.5 liters of 2.5% per [facility] 2 liters of 2.5% for last fill and mid day drain. Pt says EDW "about 202" (pre admission) Dr. Denny PeonBaveja  Assessment/ Plan:   1. 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 ESRD continue with [facility] but due to dizziness and poor po intake changed PD fluid to 1/2 1.5% and 2.5% with improvement of symptoms today.  Continue with 1.5/2.5% dialysate 3. Nausea/vomiting- possibly related to narcotics may want to consider phenergan or zofran before meals. Also could be diabetic gastroparesis 4. 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 DM- per primary 7. Nutrition: renal diet 8. Hypertension: stable 9. Chest pain- s/p cath wtihout new occlusions or stenosis 10. 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 Disposition- continue with CIR   Irena CordsJoseph A. Dollene Mallery, MD [facility] [phone]

## 2017-01-26 NOTE — Progress Notes (Signed)
Physical Therapy Note  Patient Details  Name: Joshua [name] MRN: [id number] Date of Birth: 04-Feb-1941 Today's Date: 01/26/2017    Time: 2956-21301045-1125 40 minutes  1:1 Pt continues with c/o residual limb pain, agreeable to treatment with encouragement.  [name] mobility x 25' with min A, limited by UE fatigue.  Sliding board transfer blocked practice with pt requiring manual facilitation for forward wt shifts, max / +2 assist for transfers. Pt did improve with repetition.  stedy for standing tolerance with pt requiring max A for initial stand, +2 assist to stand x 2 more attempts due to fatigue.  Pt left in chair with needs at hand and wife present.   Lainie Daubert 01/26/2017, 11:28 AM

## 2017-01-26 NOTE — Progress Notes (Addendum)
[place] PHYSICAL MEDICINE & REHABILITATION     PROGRESS NOTE  Subjective/Complaints:  Pt seen laying in bed this AM.  He slept well overnight per wife.  He states he is doing well this AM.  Spoke with nursing regarding dressing changes.   ROS: Denies nausea, vomiting, diarrhea, shortness of breath or chest pain   Objective: Vital Signs: Blood pressure (!) 143/57, pulse 86, temperature 99.2 F (37.3 C), temperature source Oral, resp. rate 16, height 5\' 9"  (1.753 m), weight 72 kg (158 lb 11.7 oz), SpO2 100 %. No results found.  Recent Labs  01/24/17 1401  WBC 12.4*  HGB 8.9*  HCT 28.5*  PLT 499*   No results for input(s): NA, K, CL, GLUCOSE, BUN, CREATININE, CALCIUM in the last 72 hours.  Invalid input(s): CO CBG (last 3)   Recent Labs  01/25/17 1804 01/25/17 2049 01/26/17 0648  GLUCAP 83 111* 179*    Wt Readings from Last 3 Encounters:  01/26/17 72 kg (158 lb 11.7 oz)  01/17/17 86.7 kg (191 lb 2.2 oz)  01/05/17 87.9 kg (193 lb 12.6 oz)    Physical Exam:  BP (!) 143/57 (BP Location: Right Arm)   Pulse 86   Temp 99.2 F (37.3 C) (Oral)   Resp 16   Ht 5\' 9"  (1.753 m)   Wt 72 kg (158 lb 11.7 oz)   SpO2 100%   BMI 23.44 kg/m  Constitutional: He appears well-nourished. No distress. Obese. HENT: Normocephalic. Areas of abrasion healing.  Eyes: EOMI. No discharge.  Cardiovascular: RRR. No JVD . Respiratory: Effort normal and breath sounds normal.  GI: Soft. Bowel sounds are normal.   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  Motor: 4+/5 grossly throughout (unchanged) Skin:  BKA incision with small area of drainage along medial side Psychiatric: His speech is normal. He is withdrawn.   Assessment/Plan: 1. Functional deficits secondary to left BKA which require 3+ hours per day of interdisciplinary therapy in a comprehensive inpatient rehab setting. Physiatrist is providing close team  supervision and 24 hour management of active medical problems listed below. Physiatrist and rehab team continue to assess barriers to discharge/monitor patient progress toward functional and medical goals.  Function:  Bathing Bathing position   Position: Bed (bedlevel for LB, seated EOB for UB)  Bathing parts Body parts bathed by patient: Right arm, Left arm, Chest, Abdomen, Front perineal area, Left upper leg, Right upper leg Body parts bathed by helper: Buttocks, Right lower leg, Back  Bathing assist Assist Level:  (Mod assist)      Upper Body Dressing/Undressing Upper body dressing   What is the patient wearing?: Pull over shirt/dress     Pull over shirt/dress - Perfomed by patient: Thread/unthread right sleeve, Thread/unthread left sleeve, Put head through opening Pull over shirt/dress - Perfomed by helper: Pull shirt over trunk        Upper body assist Assist Level: Touching or steadying assistance(Pt > 75%)      Lower Body Dressing/Undressing Lower body dressing   What is the patient wearing?: Pants, Non-skid slipper socks Underwear - Performed by patient: Pull underwear up/down Underwear - Performed by helper: Thread/unthread right underwear leg, Thread/unthread left underwear leg   Pants- Performed by helper: Thread/unthread right pants leg, Thread/unthread left pants leg, Pull pants up/down   Non-skid slipper socks- Performed by helper: Don/doff right sock       Shoes - Performed by helper: Don/doff right shoe  Lower body assist Assist for lower body dressing: Touching or steadying assistance (Pt > 75%)      Toileting Toileting Toileting activity did not occur: Safety/medical concerns   Toileting steps completed by helper: Adjust clothing prior to toileting, Performs perineal hygiene, Adjust clothing after toileting Toileting Assistive Devices:  (slide board/Stedy)  Toileting assist Assist level: Two helpers   Transfers Chair/bed transfer    Chair/bed transfer method: Lateral scoot Chair/bed transfer assist level: Moderate assist (Pt 50 - 74%/lift or lower) Chair/bed transfer assistive device: Sliding board Mechanical lift: Stedy   Locomotion Ambulation Ambulation activity did not occur: Safety/medical concerns         Wheelchair   Type: Manual Max wheelchair distance: 30 Assist Level: Touching or steadying assistance (Pt > 75%)  Cognition Comprehension Comprehension assist level: Follows basic conversation/direction with no assist  Expression Expression assist level: Expresses basic needs/ideas: With extra time/assistive device  Social Interaction Social Interaction assist level: Interacts appropriately 90% of the time - Needs monitoring or encouragement for participation or interaction.  Problem Solving Problem solving assist level: Solves basic 90% of the time/requires cueing < 10% of the time  Memory Memory assist level: Recognizes or recalls 75 - 89% of the time/requires cueing 10 - 24% of the time    Medical Problem List and Plan: 1. Functional deficitssecondary to left BKA  Cont CIR, 15/7  May weight bear through the heel on the right side  Will speak with Ortho regarding wound eval and staple removal 2. DVT Prophylaxis/Anticoagulation: Pharmaceutical: Heparin 3. Pain Management: Tylenol or Celebrex prn. Unable to tolerate muscle relaxers or pain medications due to SE of tremors, confusion and hallucinations.  4. Mood: LCSW to follow for evaluation and support.  5. Neuropsych: This patient is not fullycapable of making decisions on his own behalf. 6. Skin/Wound Care: Routine pressure relief measures. Prevalon boot for right foot. Monitor daily for intake to promote healing. Protein supplement and vitamins to promote wound healing.  7. Fluids/Electrolytes/Nutrition: Monitor I/O. Lytes/CBC per PD protocol.   Requires encouragement for nutrition  Remeron 15 mg started on 10/17 8. Leucocytosis: WBC has  fluctuated.   Monitor for fevers and other sign of infection.    Afebrile  WBCs 12.4 on 10/16  Labs ordered for tomorrow  Cont to monitor 9. Hypotension: Monitor BP bid   Vitals:   01/25/17 2145 01/26/17 0600  BP: 128/60 (!) 143/57  Pulse: 72 86  Resp:  16  Temp:  99.2 F (37.3 C)  SpO2:  100%   Midodrine decreased to daily on 10/16  Improved  Cont to monitor 10. ESRD on PD: Schedule PD in evening and early am--unhook by breakfast.  11. Anemia of chronic disease: On aranesp every week.   Hb 8.9 on 10/16  Cont to monitor 12. T2DM insulin dependent: Brittle diabetic who's hard to control in part due to inconsistent intake. Changed to Premier protein with meals.   NPH adjusted to 25u qam and 60u qpm CBG (last 3)   Recent Labs  01/25/17 1804 01/25/17 2049 01/26/17 0648  GLUCAP 83 111* 179*   Labile with hypoglycemia yesterday better nutrition  Will monitor for trend 13. Constipation: Continue Colace bid with lactulose prn.  14. PVD with s/p left femoral pseudoaneurysm repair.   Prevalon boot for RLE with pressure relief measures.  15. Delirium: ?Resolved   LOS (Days) 9 A FACE TO FACE EVALUATION WAS PERFORMED  [name] Karis Juba 01/26/2017 8:41 AM

## 2017-01-26 NOTE — Patient Care Conference (Addendum)
Inpatient RehabilitationTeam Conference and Plan of Care Update Date: 01/25/2017   Time: 2:35 PM    Patient Name: Joshua [name]      Medical Record Number: [id number]  Date of Birth: 07/08/1940 Sex: Male         Room/Bed: 4W05C/4W05C-01 Payor Info: Payor: MEDICARE / Plan: MEDICARE PART A AND B / Product Type: *No Product type* /    Admitting Diagnosis: L BKA nonviable tissue  Admit Date/Time:  01/17/2017  5:16 PM Admission Comments: No comment available   Primary Diagnosis:  Unilateral complete BKA, right, sequela (HCC) Principal Problem: Unilateral complete BKA, right, sequela [facility](HCC)  Patient Active Problem List   Diagnosis Date Noted  . Poor nutrition   . Brittle diabetes (HCC)   . Adjustment disorder with depressed mood   . Hypoglycemia   . Ischemic pain of right foot   . Arterial hypotension   . Diabetes mellitus type 2 in obese (HCC)   . PVD (peripheral vascular disease) (HCC)   . Delirium   . Unilateral complete BKA, right, sequela (HCC) 01/17/2017  . Acute blood loss anemia   . Elevated AST (SGOT) 01/09/2017  . Chest pain 01/09/2017  . NSTEMI (non-ST elevated myocardial infarction) (HCC) 01/09/2017  . Hypokalemia 01/07/2017  . Hypomagnesemia 01/07/2017  . Left foot infection 01/06/2017  . Chronic hypotension 01/06/2017  . 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 IDDM (insulin dependent diabetes mellitus) (HCC) 01/06/2017  . Foot infection 01/06/2017  . Labile blood pressure   . Hallucination   . Benign essential HTN   . Ulcer of toe of right foot (HCC)   . Ulcer of external ear, limited to breakdown of skin (HCC)   . Chronic congestive heart failure (HCC)   . Anemia of chronic disease   . Labile blood glucose   . 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 Diabetic peripheral neuropathy (HCC)   . Debility 12/27/2016  . History of transmetatarsal amputation of left foot (HCC) 12/27/2016  .  Constipation   . Chronic obstructive pulmonary disease (HCC)   . Nausea   . ESRD on dialysis (HCC)   . Leukocytosis   . Tachypnea   . FUO (fever of unknown origin)   . Encephalopathy 12/23/2016  . Sore throat 12/23/2016  . Gangrene of left foot (HCC) 11/26/2016  . PAD (peripheral artery disease) (HCC) 11/26/2016  . Sjogren's syndrome (HCC) 09/26/2015  . Uncontrolled type 2 diabetes mellitus with hyperglycemia, with long-term current use of insulin (HCC) 09/26/2015  . Hyperosmolar non-ketotic state in patient with type 2 diabetes mellitus (HCC) 09/25/2015  . Ileus (HCC) 09/22/2015  . Acute respiratory failure with hypoxia (HCC) 09/22/2015  . Acute gouty arthritis 09/22/2015  . CAD (coronary artery disease) of artery bypass graft 09/22/2015  . Sepsis (HCC) 09/21/2015  . Elevated troponin level 09/21/2015  . ESRD on peritoneal dialysis (HCC) 09/21/2015  . CVA (cerebral infarction) 09/21/2015    Expected Discharge Date: Expected Discharge Date: 02/07/17  Team Members Present: Physician leading conference: Dr. Maryla MorrowAnkit Patel Social Worker Present: Amada JupiterLucy Tyara Dassow, LCSW Nurse Present: Willey BladeKaren Winter, RN PT Present: [name] Plumparoline Cook, PT OT Present: Rosalio LoudSarah Hoxie, OT SLP Present: Feliberto Gottronourtney Payne, SLP PPS Coordinator present : Tora DuckMarie Noel, RN, CRRN     Current Status/Progress Goal Weekly Team Focus  Medical   Functional deficits secondary to left BKA and RLE 5th digit amputation  Improve mobility, transfers, wounds, safety, ESRD, appetite, HTN  See above   Bowel/Bladder  continent and incontinent of b/b, PD nightly, oliguric, LBM 10/15, has frequent "leakage" or gas related bm accidents  mod I assist   maintain b/b with mod I assist/medication as needed   Swallow/Nutrition/ Hydration             ADL's   total assist LB bathing/dressing at bed level, utilizing Stedy for transfers and +2 for slide board due to decreased weight shift and problem solving  supervision to min assist.  ? if pt will be  able to achieve min assist  ADL retraining, sitting balance, transfers, strengthening, endurance   Mobility   max A transfers, bed mobility  min A overall  activity tolerance, transfers   Communication             Safety/Cognition/ Behavioral Observations            Pain   intermittent pain to left leg, usually relieved by tylenol, occasional use of 2.5mg  oxycodone.  <4  assess pain q shift and prn   Skin   Incision to left BKA, pressure wound to bridge of nose assessed by [facility] RN and recommend plastic consult. surgical wound to right 5th toe amputation completed 10/12, gerhradts cream to excoriated buttocks, sheering, improved with air mattress.  mod assist  assess skin q shift and prn, and treat wounds per md orders.    Rehab Goals Patient on target to meet rehab goals: Yes *See Care Plan and progress notes for long and short-term goals.     Barriers to Discharge  Current Status/Progress Possible Resolutions Date Resolved   Physician    Decreased caregiver support;Medical stability;Home environment access/layout;Wound Care;Lack of/limited family support;Other (comments)  PD  See above  Therapies, follow labs, follow wounds, PD, appetite stimulant      Nursing                  PT                    OT                  SLP                SW                Discharge Planning/Teaching Needs:  Home with wife and family to provide 24/7 assistance.  Teaching to be planned closer to d/c.   Team Discussion:  MD monitoring wounds, BSs, encourage po.  NV this morning - started on Zofran and will monitor.  Pt also reports issues with swallowing even PTA.  Making limited progress; poor ability to problem solve and learn new techniques.  Max +2 for transfers currently.  Likely will need hospital bed at home.  Goals being set for min assist w/c level may may need to downgrade.    Revisions to Treatment Plan:  Reduce tx schedule to 15/7    Continued Need for Acute Rehabilitation Level  of Care: The patient requires daily medical management by a physician with specialized training in physical medicine and rehabilitation for the following conditions: Daily direction of a multidisciplinary physical rehabilitation program to ensure safe treatment while eliciting the highest outcome that is of practical value to the patient.: Yes Daily medical management of patient stability for increased activity during participation in an intensive rehabilitation regime.: Yes Daily analysis of laboratory values and/or radiology reports with any subsequent need for medication adjustment of medical intervention for : Post surgical problems;Diabetes problems;Wound care  problems;Blood pressure problems;Renal problems;Nutritional problems  Aminah Zabawa 01/26/2017, 2:06 PM

## 2017-01-26 NOTE — Progress Notes (Signed)
Unable to get another CBG as of yet, pt on bed pan and would not take po's, dialysis here to set up. Meal tray will be served once dialysis has been started.

## 2017-01-26 NOTE — Significant Event (Signed)
Hypoglycemic Event  CBG: 42  Treatment: 15 GM carbohydrate snack  Symptoms: None  Follow-up CBG: Time:1753 CBG Result:72  Possible Reasons for Event: Unknown  Comments/MD notified:carbs and meal tray provided    Scot JunKaren E Atara Paterson

## 2017-01-26 NOTE — Telephone Encounter (Signed)
[facility]an[facility], called asking if [name] would like staples removed from stump on patient or if [name] wanted to see patient before removing during rounds.  Patient had BKA 01/11/17 is still having drainage.  Jesusita OkaDan, cell phone CB# [phone]

## 2017-01-26 NOTE — Progress Notes (Signed)
Occupational Therapy Session Note  Patient Details  Name: Joshua [name] MRN: [id number] Date of Birth: 1940-04-13  Today's Date: 01/26/2017 OT Individual Time: [id number] and 1415-1500 OT Individual Time Calculation (min): 40 min and 45 min and Today's Date: 01/26/2017 OT Missed Time: 20 Minutes Missed Time Reason: Unavailable (comment) (RN disconnecting PD)   Short Term Goals: Week 2:  OT Short Term Goal 1 (Week 2): Pt will transfer to drop arm commode with slide board mod assist OT Short Term Goal 2 (Week 2): Pt will perform sit to stand with mod A in prep for clothing management OT Short Term Goal 3 (Week 2): Pt will don pants in sitting with lateral leans to pull pants over hips post toileting with min assist  Skilled Therapeutic Interventions/Progress Updates:    1) Treatment session with focus on bed mobility and increased participation in self-care tasks.  Pt unavailable initially due to RN present to disconnect PD in sterile procedure, therefore pt missed 20 mins scheduled treatment.  Engaged in LB bathing and dressing at bed level with pt requiring assistance to don Rt pant leg over toes due to pain and hesitancy.  Pt able to bridge to doff underwear and utilized bridging and rolling to pull underwear and pants over hips.  Mod assist for rolling to fully adjust pants over hips.  Max cues for sequencing with bathing and dressing to promote increased participation.  Slide board transfer with mod assist and max cues for weight shift and initiation of transfer.  Left upright in w/c with wife present to assist with UB bathing/dressing.  2) Treatment session with focus on BUE strengthening and overall endurance.  Pt able to completed bed mobility to come to sitting at EOB with supervision.  Engaged in UE strengthening seated at EOB with 2# medicine ball with chest presses, trunk rotation, and PNF patterns with mod cues for proper technique.  Pt reports mild dizziness and light headedness,  requesting to return to bed and wife providing pt with snack due to questionable low blood sugar.  Completed chest presses, bicep curls, and overhead presses with 2# dowel rod in chair position in bed, after snack, for endurance.  Again requiring increased cues for proper technique.  Discussed pt goals and progress towards goals with pt and wife.  Therapy Documentation Precautions:  Precautions Precautions: Fall Precaution Comments: PD port, VAC left residual limb Restrictions Weight Bearing Restrictions: Yes LLE Weight Bearing: Non weight bearing General: General OT Amount of Missed Time: 20 Minutes Pain:  Pt with c/o pain in Rt foot at 5th digit amputation site.  RN premedicated.  See Function Navigator for Current Functional Status.   Therapy/Group: Individual Therapy  Rosalio LoudHOXIE, Cashe Gatt 01/26/2017, 12:08 PM

## 2017-01-26 NOTE — Telephone Encounter (Signed)
Call and recommended to leave staples in a little longer. I will see in the hospital.

## 2017-01-27 ENCOUNTER — Inpatient Hospital Stay (HOSPITAL_COMMUNITY): Payer: Medicare Other | Admitting: Physical Therapy

## 2017-01-27 ENCOUNTER — Inpatient Hospital Stay (HOSPITAL_COMMUNITY): Payer: Medicare Other | Admitting: Occupational Therapy

## 2017-01-27 LAB — CBC WITH DIFFERENTIAL/PLATELET
BAND NEUTROPHILS: 0 %
BASOS ABS: 0 10*3/uL (ref 0.0–0.1)
BASOS PCT: 0 %
BLASTS: 0 %
EOS ABS: 0.2 10*3/uL (ref 0.0–0.7)
Eosinophils Relative: 2 %
HEMATOCRIT: 25.6 % — AB (ref 39.0–52.0)
HEMOGLOBIN: 8.2 g/dL — AB (ref 13.0–17.0)
LYMPHS PCT: 11 %
Lymphs Abs: 1.1 10*3/uL (ref 0.7–4.0)
MCH: 31.2 pg (ref 26.0–34.0)
MCHC: 32 g/dL (ref 30.0–36.0)
MCV: 97.3 fL (ref 78.0–100.0)
METAMYELOCYTES PCT: 0 %
MONO ABS: 0.7 10*3/uL (ref 0.1–1.0)
MYELOCYTES: 0 %
Monocytes Relative: 7 %
Neutro Abs: 7.6 10*3/uL (ref 1.7–7.7)
Neutrophils Relative %: 80 %
OTHER: 0 %
PROMYELOCYTES ABS: 0 %
Platelets: 464 10*3/uL — ABNORMAL HIGH (ref 150–400)
RBC: 2.63 MIL/uL — ABNORMAL LOW (ref 4.22–5.81)
RDW: 16.5 % — ABNORMAL HIGH (ref 11.5–15.5)
WBC: 9.6 10*3/uL (ref 4.0–10.5)
nRBC: 0 /100 WBC

## 2017-01-27 LAB — GLUCOSE, CAPILLARY
GLUCOSE-CAPILLARY: 210 mg/dL — AB (ref 65–99)
GLUCOSE-CAPILLARY: 199 mg/dL — AB (ref 65–99)
Glucose-Capillary: 100 mg/dL — ABNORMAL HIGH (ref 65–99)
Glucose-Capillary: 187 mg/dL — ABNORMAL HIGH (ref 65–99)

## 2017-01-27 MED ORDER — INSULIN NPH (HUMAN) (ISOPHANE) 100 UNIT/ML ~~LOC~~ SUSP
60.0000 [IU] | Freq: Every day | SUBCUTANEOUS | Status: DC
  Administered 2017-01-27 – 2017-02-04 (×9): 60 [IU] via SUBCUTANEOUS

## 2017-01-27 MED ORDER — INSULIN NPH (HUMAN) (ISOPHANE) 100 UNIT/ML ~~LOC~~ SUSP
15.0000 [IU] | Freq: Every day | SUBCUTANEOUS | Status: DC
  Administered 2017-01-27 – 2017-02-05 (×10): 15 [IU] via SUBCUTANEOUS
  Filled 2017-01-27: qty 10

## 2017-01-27 NOTE — Progress Notes (Signed)
Occupational Therapy Session Note  Patient Details  Name: Joshua KitchenJames D Caul MRN: [id number] Date of Birth: 10-01-1940  Today's Date: 01/27/2017 OT Individual Time: 1115-1200 and 1335-1430 OT Individual Time Calculation (min): 45 min and 55 min   Short Term Goals: Week 2:  OT Short Term Goal 1 (Week 2): Pt will transfer to drop arm commode with slide board mod assist OT Short Term Goal 2 (Week 2): Pt will perform sit to stand with mod A in prep for clothing management OT Short Term Goal 3 (Week 2): Pt will don pants in sitting with lateral leans to pull pants over hips post toileting with min assist  Skilled Therapeutic Interventions/Progress Updates:    1) Treatment session with focus on anterior weight shift to improve participation in functional tasks and transfers.  Pt received upright in w/c reporting pain in Lt residual limb.  Completed slide board transfer w/c to therapy mat with +2 initially progressing to mod assist.  Engaged in ball toss seated edge of mat with focus on anterior weight shift.  Therapist providing min-mod assist to facilitate adequate weight shift.  Engaged in ball tapping with 2# dowel rod to increase strength while challenging sitting balance and anterior weight shift.  Returned to w/c via slide board with mod assist.  Pt able to boost self back fully in to w/c with verbal cues only.  2) Treatment session with focus on management of residual limb and d/c planning.  Pt received upright in w/c, noted blood on limb wrapping of residual limb.  Therapist spoke with RN to decide on necessary dressing, then therapist changed dressing and rewrapped residual limb in figure 8 pattern while educating pt and his wife on rationale and need for appropriate pressure for limb shaping.  Engaged in discussion regarding pt and pt's wife concerns, with wife asking questions about transfers to toilet, recliner, and car.  Pt's wife unsure if his BSC at home is a drop arm, encouraged to  investigate as pt will benefit from drop arm BSC for safety with transfers.  Discussed various seating recommendations and need for continued education and practice with transfers to decrease burden of care.    Therapy Documentation Precautions:  Precautions Precautions: Fall Precaution Comments: PD port, VAC left residual limb Restrictions Weight Bearing Restrictions: Yes LLE Weight Bearing: Non weight bearing LLE Partial Weight Bearing Percentage or Pounds:  (off loading boot) Pain:  Pt with c/o pain in LLE and Rt foot, premedicated.  See Function Navigator for Current Functional Status.   Therapy/Group: Individual Therapy  Rosalio LoudHOXIE, Livan Hires 01/27/2017, 12:27 PM

## 2017-01-27 NOTE — Progress Notes (Signed)
[facility] PHYSICAL MEDICINE & REHABILITATION     PROGRESS NOTE  Subjective/Complaints:  Pt seen laying in bed this AM.  He slept well overnight.  Wife notes he is doing "real good".  Patient states he ate more as well.   ROS: Denies nausea, vomiting, diarrhea, shortness of breath or chest pain   Objective: Vital Signs: Blood pressure (!) 117/58, pulse 84, temperature 98.3 F (36.8 C), temperature source Oral, resp. rate 16, height 5\' 9"  (1.753 m), weight 72 kg (158 lb 11.7 oz), SpO2 97 %. No results found.  Recent Labs  01/24/17 1401  WBC 12.4*  HGB 8.9*  HCT 28.5*  PLT 499*    Recent Labs  01/26/17 0612 01/26/17 0851  NA 129* 131*  K 3.6 3.6  CL 88* 89*  GLUCOSE 226* 181*  BUN 72* 73*  CREATININE 7.28* 7.46*  CALCIUM 8.3* 8.3*   CBG (last 3)   Recent Labs  01/26/17 1753 01/26/17 2105 01/27/17 0635  GLUCAP 72 168* 210*    Wt Readings from Last 3 Encounters:  01/26/17 72 kg (158 lb 11.7 oz)  01/17/17 86.7 kg (191 lb 2.2 oz)  01/05/17 87.9 kg (193 lb 12.6 oz)    Physical Exam:  BP (!) 117/58 (BP Location: Right Arm)   Pulse 84   Temp 98.3 F (36.8 C) (Oral)   Resp 16   Ht 5\' 9"  (1.753 m)   Wt 72 kg (158 lb 11.7 oz)   SpO2 97%   BMI 23.44 kg/m  Constitutional: He appears well-nourished. No distress. Obese. HENT: Normocephalic. Areas of abrasion healing.  Eyes: EOMI. No discharge.  Cardiovascular: RRR. No JVD . Respiratory: Effort normal and breath sounds normal.  GI: Soft. Bowel sounds are normal.   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  Motor: 4+/5 grossly throughout (stable) Skin:  BKA incision with small area of drainage along medial side Psychiatric: His speech is normal. He is withdrawn.   Assessment/Plan: 1. Functional deficits secondary to left BKA which require 3+ hours per day of interdisciplinary therapy in a comprehensive inpatient rehab setting. Physiatrist  is providing close team supervision and 24 hour management of active medical problems listed below. Physiatrist and rehab team continue to assess barriers to discharge/monitor patient progress toward functional and medical goals.  Function:  Bathing Bathing position   Position: Bed (bedlevel for LB, seated EOB for UB)  Bathing parts Body parts bathed by patient: Right arm, Left arm, Chest, Abdomen, Front perineal area, Left upper leg, Right upper leg Body parts bathed by helper: Buttocks, Right lower leg, Back  Bathing assist Assist Level:  (Mod assist)      Upper Body Dressing/Undressing Upper body dressing   What is the patient wearing?: Pull over shirt/dress     Pull over shirt/dress - Perfomed by patient: Thread/unthread right sleeve, Thread/unthread left sleeve, Put head through opening Pull over shirt/dress - Perfomed by helper: Pull shirt over trunk        Upper body assist Assist Level: Touching or steadying assistance(Pt > 75%)      Lower Body Dressing/Undressing Lower body dressing   What is the patient wearing?: Pants, Non-skid slipper socks Underwear - Performed by patient: Thread/unthread left underwear leg, Pull underwear up/down Underwear - Performed by helper: Thread/unthread right underwear leg Pants- Performed by patient: Pull pants up/down Pants- Performed by helper: Thread/unthread left pants leg, Thread/unthread right pants leg   Non-skid slipper socks- Performed by helper:  Don/doff right sock       Shoes - Performed by helper: Don/doff right shoe          Lower body assist Assist for lower body dressing:  (Mod assist)      Toileting Toileting Toileting activity did not occur: Safety/medical concerns   Toileting steps completed by helper: Adjust clothing prior to toileting, Performs perineal hygiene, Adjust clothing after toileting Toileting Assistive Devices: Toilet aid (urinal/bedpan)  Toileting assist Assist level: More than reasonable time,  Set up/obtain supplies   Transfers Chair/bed transfer   Chair/bed transfer method: Lateral scoot Chair/bed transfer assist level: Moderate assist (Pt 50 - 74%/lift or lower) Chair/bed transfer assistive device: Sliding board Mechanical lift: Stedy   Locomotion Ambulation Ambulation activity did not occur: Safety/medical concerns         Wheelchair   Type: Manual Max wheelchair distance: 30 Assist Level: Touching or steadying assistance (Pt > 75%)  Cognition Comprehension Comprehension assist level: Follows basic conversation/direction with no assist  Expression Expression assist level: Expresses basic needs/ideas: With extra time/assistive device  Social Interaction Social Interaction assist level: Interacts appropriately 90% of the time - Needs monitoring or encouragement for participation or interaction.  Problem Solving Problem solving assist level: Solves basic 90% of the time/requires cueing < 10% of the time  Memory Memory assist level: Recognizes or recalls 75 - 89% of the time/requires cueing 10 - 24% of the time    Medical Problem List and Plan: 1. Functional deficitssecondary to left BKA  Cont CIR, 15/7  May weight bear through the heel on the right side  Ortho to evaluate for recs on staple removal  2. DVT Prophylaxis/Anticoagulation: Pharmaceutical: Heparin 3. Pain Management: Tylenol or Celebrex prn. Unable to tolerate muscle relaxers or pain medications due to SE of tremors, confusion and hallucinations.  4. Mood: LCSW to follow for evaluation and support.  5. Neuropsych: This patient is not fullycapable of making decisions on his own behalf. 6. Skin/Wound Care: Routine pressure relief measures. Prevalon boot for right foot. Monitor daily for intake to promote healing. Protein supplement and vitamins to promote wound healing.  7. Fluids/Electrolytes/Nutrition: Monitor I/O. Lytes/CBC per PD protocol.   Requires encouragement for nutrition  Remeron 15 mg started  on 10/17 - improving 8. Leucocytosis: WBC has fluctuated.   Monitor for fevers and other sign of infection.    Afebrile  WBCs 12.4 on 10/16  Labs pending  Cont to monitor 9. Hypotension: Monitor BP bid   Vitals:   01/26/17 2201 01/27/17 0500  BP: (!) 143/51 (!) 117/58  Pulse: 88 84  Resp:  16  Temp:  98.3 F (36.8 C)  SpO2:  97%   Midodrine decreased to daily on 10/16  Relatively controlled 10/19  Cont to monitor 10. ESRD on PD: Schedule PD in evening and early am--unhook by breakfast.  11. Anemia of chronic disease: On aranesp every week.   Hb 8.9 on 10/16  Cont to monitor 12. T2DM insulin dependent: Brittle diabetic who's hard to control in part due to inconsistent intake. Changed to Premier protein with meals.   NPH decreased to to 15u qam   NPH 60u qpm CBG (last 3)   Recent Labs  01/26/17 1753 01/26/17 2105 01/27/17 0635  GLUCAP 72 168* 210*   Labile with hypoglycemia again yesterday  13. Constipation: Continue Colace bid with lactulose prn.  14. PVD with s/p left femoral pseudoaneurysm repair.   Prevalon boot for RLE with pressure relief measures.  15. Delirium: ?  Resolved   LOS (Days) 10 A FACE TO FACE EVALUATION WAS PERFORMED  [name] 01/27/2017 8:10 AM

## 2017-01-27 NOTE — Progress Notes (Signed)
Physical Therapy Note  Patient Details  Name: Joshua KitchenJames D Dolores MRN: [id number] Date of Birth: 14-Dec-1940 Today's Date: 01/27/2017    Time: 1000-1055 55 minutes  1:1 Pt continues with c/o residual limb and Rt foot pain, rests given as needed.  Pt performs blocked practice of sliding board transfers w/c <> bed and w/c <> mat with max A, manual facilitation for forward wt shift and max A for scooting.  Pt giving limited assistance during transfers despite verbal and manual cues.  Pt performed forward wt shifts in chair with mod A due to trunk weakness.  Pt participated in power w/c eval with [name]. Pt and wife present and verbalize understanding of power mobility needs.   Dontre Laduca 01/27/2017, 11:51 AM

## 2017-01-27 NOTE — Progress Notes (Signed)
Staple removal to left AKA, 31 staples removed, steri strips applied to spot with small amount of oozing blood to the medial side of incision. Pt tolerated procedure well.

## 2017-01-27 NOTE — Plan of Care (Signed)
Problem: RH SKIN INTEGRITY Goal: RH STG MAINTAIN SKIN INTEGRITY WITH ASSISTANCE STG Maintain Skin Integrity With mod Assistance.  Outcome: Not Progressing Wife or nurse perform Goal: RH STG ABLE TO PERFORM INCISION/WOUND CARE W/ASSISTANCE STG Able To Perform Incision/Wound Care With mod Assistance.  Outcome: Not Progressing Nurse performs or wife

## 2017-01-27 NOTE — Plan of Care (Signed)
Problem: RH BOWEL ELIMINATION Goal: RH STG MANAGE BOWEL WITH ASSISTANCE STG Manage Bowel with mod I Assistance.  Outcome: Progressing  01/27/17 1535  Bowel Management Goals  STG: Pt will manage bowels with assistance 6-Modified independent   Goal: RH STG MANAGE BOWEL W/MEDICATION W/ASSISTANCE STG Manage Bowel with Medication with mod I Assistance.  Outcome: Progressing  01/27/17 1535  Bowel Management Goals  STG: Pt will manage bowels with medication with assistance 6-Modified independent    Problem: RH SKIN INTEGRITY Goal: RH STG MAINTAIN SKIN INTEGRITY WITH ASSISTANCE STG Maintain Skin Integrity With mod Assistance.  Outcome: Progressing  01/27/17 1535  Skin Integrity Goals  STG: Maintain skin integrity with assistance 2-Maximum assistance   Goal: RH STG ABLE TO PERFORM INCISION/WOUND CARE W/ASSISTANCE STG Able To Perform Incision/Wound Care With mod Assistance.  Outcome: Progressing  01/27/17 1535  Skin Integrity Goals  STG: Pt will be able to perform incision/wound care with assistance 1-Total assistance

## 2017-01-27 NOTE — Progress Notes (Signed)
Patient ID: Joshua [name], male   DOB: 06/15/1940, 76 y.o.   MRN: [id number]  [facility] Progress Note    Subjective:   Feeling better   Objective:   BP (!) 132/43 (BP Location: Right Arm)   Pulse 88   Temp 98.6 F (37 C) (Oral)   Resp 17   Ht 5\' 9"  (1.753 m)   Wt 67 kg (147 lb 11.3 oz)   SpO2 97%   BMI 21.81 kg/m   Intake/Output: I/O last 3 completed shifts: In: 660 [P.O.:660] Out: -    Intake/Output this shift:  Total I/O In: [id number] [P.O.:240; Other:12495] Out: [id number] [Other:12956] Weight change:   Physical Exam: Gen: NAD CVS: no rub Resp:cta Abd: benign Ext: s/p LBKA, no edema, LUE AVF +T/B  Labs: BMET  Recent Labs Lab 01/23/17 0604 01/26/17 0612 01/26/17 0851  NA 132* 129* 131*  K 3.2* 3.6 3.6  CL 90* 88* 89*  CO2 28 29 29   GLUCOSE 288* 226* 181*  BUN 62* 72* 73*  CREATININE 7.18* 7.28* 7.46*  ALBUMIN 1.5* 1.6* 1.5*  CALCIUM 9.1 8.3* 8.3*  PHOS 3.8 4.9* 4.7*   CBC  Recent Labs Lab 01/23/17 0604 01/24/17 1401 01/27/17 0537  WBC 9.2 12.4* 9.6  NEUTROABS  --   --  7.6  HGB 9.1* 8.9* 8.2*  HCT 29.6* 28.5* 25.6*  MCV 98.7 97.6 97.3  PLT 507* 499* 464*    @IMGRELPRIORS @ Medications:    . aspirin  81 mg Oral Daily  . atorvastatin  80 mg Oral q1800  . calcium acetate  667 mg Oral TID WC  . carvedilol  12.5 mg Oral QHS  . clopidogrel  75 mg Oral Q breakfast  . darbepoetin (ARANESP) injection - NON-DIALYSIS  200 mcg Subcutaneous Q Thu-1800  . docusate sodium  100 mg Oral BID  . feeding supplement (PRO-STAT SUGAR FREE 64)  30 mL Oral BID  . gentamicin cream  1 application Topical BID  . gentamicin cream  1 application Topical Daily  . heparin subcutaneous  5,000 Units Subcutaneous Q8H  . insulin aspart  0-5 Units Subcutaneous QHS  . insulin aspart  0-9 Units Subcutaneous TID WC  . insulin NPH Human  15 Units Subcutaneous QAC breakfast   And  . insulin NPH Human  60 Units Subcutaneous QHS  . midodrine  5 mg Oral Q  breakfast  . mirtazapine  15 mg Oral QHS  . multivitamin  1 tablet Oral QHS  . protein supplement shake  2 oz Oral TID WC  . senna-docusate  1 tablet Oral QHS  . [START ON 02/04/2017] Vitamin D (Ergocalciferol)  50,000 Units Oral Q30 days     Dialysis Orders:  [facility] 6 exchanges/day one mid-day exchange (occurs around 9am).  2.5 liters of 2.5% per [facility] 2 liters of 2.5% for last fill and mid day drain. Pt says EDW "about 202" (pre admission) Dr. Denny PeonBaveja  Assessment/ Plan:   1. 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 ESRD continue with [facility] but due to dizziness and poor po intake changed PD fluid to 1/2 1.5% and 2.5% with improvement of symptoms today.  Continue with 1.5/2.5% dialysate 3. Nausea/vomiting- possibly related to narcotics may want to consider phenergan or zofran before meals. Also could be diabetic gastroparesis 4. ABLA/Anemia of CKD stage 5: continue with Aranesp and transfuse prn 5.  CKD-MBD: stable and continue with current regimen 6. DM- per primary 7. Nutrition: renal diet 8. Hypertension: stable 9. Chest pain- s/p cath wtihout new occlusions or stenosis 10. 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 Disposition- continue with CIR   [name], MD [facility], [facility] Pager [phone] 01/27/2017, 1:59 PM

## 2017-01-27 NOTE — Consult Note (Addendum)
WOC Nurse wound consult note WOC team has previously been following for other sites; requested today for right groin. Reason for Consult: Consult requested to assess right groin wound, where a previous vascular procedure was performed.  Wound type: full thickness puncture wound Measurement: .1X.1X.2cm Wound bed: location is too narrow to visualize opening Drainage (amount, consistency, odor) mod amt yellow drainage Periwound: Linear scar tissue surrounding Dressing procedure/placement/frequency: Aquacel to absorb drainage and absorb drainage.  If wound declines, then please re-consult vascular team for further questions. Please re-consult if further assistance is needed.  Thank-you,  Cammie Mcgeeawn Jesse Nosbisch MSN, RN, CWOCN, [place]CN-AP, CNS [phone]

## 2017-01-27 NOTE — Progress Notes (Signed)
Social Work Patient ID: Joshua [name], male   DOB: [birth year], 76 y.o.   MRN: [id number]   Have reviewed team conference with pt and wife who are aware and agreeable with targeted d/c date of 10/30 and min assist w/c level goals.  They are also aware that his schedule has been modified to 15/7 hours.  Pt's affect remains flat.  Neuropsychology consult completed this week for coping support.  Will continue to follow.  Frantz Quattrone, LCSW

## 2017-01-27 NOTE — Progress Notes (Signed)
Occupational Therapy Session Note  Patient Details  Name: [name] MRN: [id number] Date of Birth: 10/20/1940  Today's Date: 01/27/2017 OT Individual Time: 0930-1000 OT Individual Time Calculation (min): 30 min    Short Term Goals: Week 1:  OT Short Term Goal 1 (Week 1): Pt will transfer to toilet with mod assist squat pivot vs slide board OT Short Term Goal 1 - Progress (Week 1): Progressing toward goal OT Short Term Goal 2 (Week 1): Pt will perform sit to stand with mod A in prep for clothing management OT Short Term Goal 2 - Progress (Week 1): Not met OT Short Term Goal 3 (Week 1): Pt will don pants with mod assist with lateral leans OT Short Term Goal 3 - Progress (Week 1): Met OT Short Term Goal 4 (Week 1): Pt will navigate w/c around room to obtain clothing for ADL with min A  OT Short Term Goal 4 - Progress (Week 1): Met Week 2:  OT Short Term Goal 1 (Week 2): Pt will transfer to drop arm commode with slide board mod assist OT Short Term Goal 2 (Week 2): Pt will perform sit to stand with mod A in prep for clothing management OT Short Term Goal 3 (Week 2): Pt will don pants in sitting with lateral leans to pull pants over hips post toileting with min assist Week 3:     Skilled Therapeutic Interventions/Progress Updates:    1:1 focus on LB dressing at bed level. Pt limited by being not able to direct his care and decr activity tolerance. Pt still requires mod to max A to roll in bed.  Pt's residual limb bleeding when arrived. Total A for dressing LB today.  Shrinker removed and dry dressing with ace wrapped applied. Pt also reports not properly cleaning his foreskin  And now has break down on penis- RN made aware.   Therapy Documentation Precautions:  Precautions Precautions: Fall Precaution Comments: PD port, VAC left residual limb Restrictions Weight Bearing Restrictions: Yes LLE Weight Bearing: Non weight bearing LLE Partial Weight Bearing Percentage or Pounds:  (off  loading boot) General:   Vital Signs:  Pain: Soreness in bilateral residual limbs  See Function Navigator for Current Functional Status.   Therapy/Group: Individual Therapy  Willeen Cass [facility] 01/27/2017, 10:30 AM

## 2017-01-27 NOTE — Telephone Encounter (Signed)
I called and spoke with [name] to advise message below.

## 2017-01-27 NOTE — Progress Notes (Signed)
Social Work Patient ID: Joshua [name], male   DOB: September 26, 1940, 76 y.o.   MRN: [id number]   Deatra InaHoyle, Teaira Croft R, LCSW Social Worker Addendum   Patient Care Conference Date of Service: 01/26/2017 2:06 PM      [] Hide copied text [] Hover for attribution information Inpatient RehabilitationTeam Conference and Plan of Care Update Date: 01/25/2017   Time: 2:35 PM    Patient Name: Joshua [name]      Medical Record Number: [id number]  Date of Birth: September 26, 1940 Sex: Male         Room/Bed: 4W05C/4W05C-01 Payor Info: Payor: MEDICARE / Plan: MEDICARE PART A AND B / Product Type: *No Product type* /    Admitting Diagnosis: L BKA nonviable tissue  Admit Date/Time:  01/17/2017  5:16 PM Admission Comments: No comment available   Primary Diagnosis:  Unilateral complete BKA, right, sequela (HCC) Principal Problem: Unilateral complete BKA, right, sequela [facility](HCC)      Patient Active Problem List   Diagnosis Date Noted  . Poor nutrition   . Brittle diabetes (HCC)   . Adjustment disorder with depressed mood   . Hypoglycemia   . Ischemic pain of right foot   . Arterial hypotension   . Diabetes mellitus type 2 in obese (HCC)   . PVD (peripheral vascular disease) (HCC)   . Delirium   . Unilateral complete BKA, right, sequela (HCC) 01/17/2017  . Acute blood loss anemia   . Elevated AST (SGOT) 01/09/2017  . Chest pain 01/09/2017  . NSTEMI (non-ST elevated myocardial infarction) (HCC) 01/09/2017  . Hypokalemia 01/07/2017  . Hypomagnesemia 01/07/2017  . Left foot infection 01/06/2017  . Chronic hypotension 01/06/2017  . 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 IDDM (insulin dependent diabetes mellitus) (HCC) 01/06/2017  . Foot infection 01/06/2017  . Labile blood pressure   . Hallucination   . Benign essential HTN   . Ulcer of toe of right foot (HCC)   . Ulcer of external ear, limited to breakdown of skin (HCC)   . Chronic congestive heart failure  (HCC)   . Anemia of chronic disease   . Labile blood glucose   . 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 Diabetic peripheral neuropathy (HCC)   . Debility 12/27/2016  . History of transmetatarsal amputation of left foot (HCC) 12/27/2016  . Constipation   . Chronic obstructive pulmonary disease (HCC)   . Nausea   . ESRD on dialysis (HCC)   . Leukocytosis   . Tachypnea   . FUO (fever of unknown origin)   . Encephalopathy 12/23/2016  . Sore throat 12/23/2016  . Gangrene of left foot (HCC) 11/26/2016  . PAD (peripheral artery disease) (HCC) 11/26/2016  . Sjogren's syndrome (HCC) 09/26/2015  . Uncontrolled type 2 diabetes mellitus with hyperglycemia, with long-term current use of insulin (HCC) 09/26/2015  . Hyperosmolar non-ketotic state in patient with type 2 diabetes mellitus (HCC) 09/25/2015  . Ileus (HCC) 09/22/2015  . Acute respiratory failure with hypoxia (HCC) 09/22/2015  . Acute gouty arthritis 09/22/2015  . CAD (coronary artery disease) of artery bypass graft 09/22/2015  . Sepsis (HCC) 09/21/2015  . Elevated troponin level 09/21/2015  . ESRD on peritoneal dialysis (HCC) 09/21/2015  . CVA (cerebral infarction) 09/21/2015    Expected Discharge Date: Expected Discharge Date: 02/07/17  Team Members Present: Physician leading conference: Dr. Maryla MorrowAnkit Patel Social Worker Present: Amada JupiterLucy Lestat Golob, LCSW Nurse Present: Willey BladeKaren Winter, RN PT Present: [name] Plumparoline Cook, PT OT Present: Rosalio LoudSarah Hoxie,  OT SLP Present: Feliberto Gottron, SLP PPS Coordinator present : Tora Duck, RN, CRRN     Current Status/Progress Goal Weekly Team Focus  Medical   Functional deficits secondary to left BKA and RLE 5th digit amputation  Improve mobility, transfers, wounds, safety, ESRD, appetite, HTN  See above   Bowel/Bladder   continent and incontinent of b/b, PD nightly, oliguric, LBM 10/15, has frequent "leakage" or gas related bm accidents  mod I  assist   maintain b/b with mod I assist/medication as needed   Swallow/Nutrition/ Hydration             ADL's   total assist LB bathing/dressing at bed level, utilizing Stedy for transfers and +2 for slide board due to decreased weight shift and problem solving  supervision to min assist.  ? if pt will be able to achieve min assist  ADL retraining, sitting balance, transfers, strengthening, endurance   Mobility   max A transfers, bed mobility  min A overall  activity tolerance, transfers   Communication             Safety/Cognition/ Behavioral Observations            Pain   intermittent pain to left leg, usually relieved by tylenol, occasional use of 2.5mg  oxycodone.  <4  assess pain q shift and prn   Skin   Incision to left BKA, pressure wound to bridge of nose assessed by [facility] RN and recommend plastic consult. surgical wound to right 5th toe amputation completed 10/12, gerhradts cream to excoriated buttocks, sheering, improved with air mattress.  mod assist  assess skin q shift and prn, and treat wounds per md orders.    Rehab Goals Patient on target to meet rehab goals: Yes *See Care Plan and progress notes for long and short-term goals.     Barriers to Discharge  Current Status/Progress Possible Resolutions Date Resolved   Physician    Decreased caregiver support;Medical stability;Home environment access/layout;Wound Care;Lack of/limited family support;Other (comments)  PD  See above  Therapies, follow labs, follow wounds, PD, appetite stimulant      Nursing                PT                    OT                 SLP            SW            Discharge Planning/Teaching Needs:  Home with wife and family to provide 24/7 assistance.  Teaching to be planned closer to d/c.   Team Discussion:  MD monitoring wounds, BSs, encourage po.  NV this morning - started on Zofran and will monitor.  Pt also reports issues with swallowing even PTA.  Making limited  progress; poor ability to problem solve and learn new techniques.  Max +2 for transfers currently.  Likely will need hospital bed at home.  Goals being set for min assist w/c level may may need to downgrade.    Revisions to Treatment Plan:  Reduce tx schedule to 15/7    Continued Need for Acute Rehabilitation Level of Care: The patient requires daily medical management by a physician with specialized training in physical medicine and rehabilitation for the following conditions: Daily direction of a multidisciplinary physical rehabilitation program to ensure safe treatment while eliciting the highest outcome that is of practical value to  the patient.: Yes Daily medical management of patient stability for increased activity during participation in an intensive rehabilitation regime.: Yes Daily analysis of laboratory values and/or radiology reports with any subsequent need for medication adjustment of medical intervention for : Post surgical problems;Diabetes problems;Wound care problems;Blood pressure problems;Renal problems;Nutritional problems  Gamal Todisco 01/26/2017, 2:06 PM       Revision History

## 2017-01-28 ENCOUNTER — Inpatient Hospital Stay (HOSPITAL_COMMUNITY): Payer: Medicare Other | Admitting: Occupational Therapy

## 2017-01-28 ENCOUNTER — Inpatient Hospital Stay (HOSPITAL_COMMUNITY): Payer: Medicare Other | Admitting: Physical Therapy

## 2017-01-28 LAB — GLUCOSE, CAPILLARY
GLUCOSE-CAPILLARY: 207 mg/dL — AB (ref 65–99)
Glucose-Capillary: 168 mg/dL — ABNORMAL HIGH (ref 65–99)
Glucose-Capillary: 76 mg/dL (ref 65–99)

## 2017-01-28 NOTE — Progress Notes (Signed)
[facility]ICAL [facility]ICINE & REHABILITATION     PROGRESS NOTE  Subjective/Complaints:  Pt seen laying in bed this AM.  He slept well overnight per wife.  Wife notes that he is doing well, but did not eat well yesterday.   ROS: Denies nausea, vomiting, diarrhea, shortness of breath or chest pain   Objective: Vital Signs: Blood pressure (!) 122/40, pulse 85, temperature 98 F (36.7 C), temperature source Oral, resp. rate 18, height 5\' 9"  (1.753 m), weight 67 kg (147 lb 11.3 oz), SpO2 100 %. No results found.  Recent Labs  01/27/17 0537  WBC 9.6  HGB 8.2*  HCT 25.6*  PLT 464*    Recent Labs  01/26/17 0612 01/26/17 0851  NA 129* 131*  K 3.6 3.6  CL 88* 89*  GLUCOSE 226* 181*  BUN 72* 73*  CREATININE 7.28* 7.46*  CALCIUM 8.3* 8.3*   CBG (last 3)   Recent Labs  01/27/17 2153 01/28/17 0645 01/28/17 1220  GLUCAP 187* 168* 76    Wt Readings from Last 3 Encounters:  01/27/17 67 kg (147 lb 11.3 oz)  01/17/17 86.7 kg (191 lb 2.2 oz)  01/05/17 87.9 kg (193 lb 12.6 oz)    Physical Exam:  BP (!) 122/40 (BP Location: Right Arm)   Pulse 85   Temp 98 F (36.7 C) (Oral)   Resp 18   Ht 5\' 9"  (1.753 m)   Wt 67 kg (147 lb 11.3 oz)   SpO2 100%   BMI 21.81 kg/m  Constitutional: He appears well-nourished. No distress. Obese. HENT: Normocephalic. Areas of abrasion healing.  Eyes: EOMI. No discharge.  Cardiovascular: RRR. No JVD . Respiratory: Effort normal and breath sounds normal.  GI: Soft. Bowel sounds are normal.   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  Motor: 4+/5 grossly throughout (unchanged) Skin:  BKA incision with dressing c/d/i Psychiatric: His speech is normal. He is withdrawn.   Assessment/Plan: 1. Functional deficits secondary to left BKA which require 3+ hours per day of interdisciplinary therapy in a comprehensive inpatient rehab setting. Physiatrist is providing close team  supervision and 24 hour management of active medical problems listed below. Physiatrist and rehab team continue to assess barriers to discharge/monitor patient progress toward functional and medical goals.  Function:  Bathing Bathing position   Position: Bed (bedlevel for LB, seated EOB for UB)  Bathing parts Body parts bathed by patient: Front perineal area (LB bathing only today with OT) Body parts bathed by helper: Buttocks, Right lower leg  Bathing assist Assist Level:  (Mod assist)      Upper Body Dressing/Undressing Upper body dressing   What is the patient wearing?: Pull over shirt/dress     Pull over shirt/dress - Perfomed by patient: Thread/unthread right sleeve, Thread/unthread left sleeve, Put head through opening Pull over shirt/dress - Perfomed by helper: Pull shirt over trunk        Upper body assist Assist Level: Touching or steadying assistance(Pt > 75%)      Lower Body Dressing/Undressing Lower body dressing   What is the patient wearing?: Pants, Non-skid slipper socks Underwear - Performed by patient: Thread/unthread left underwear leg, Pull underwear up/down Underwear - Performed by helper: Thread/unthread right underwear leg Pants- Performed by patient: Thread/unthread right pants leg, Pull pants up/down Pants- Performed by helper: Thread/unthread left pants leg   Non-skid slipper socks- Performed by helper: Don/doff right sock       Shoes - Performed  by helper: Don/doff right shoe          Lower body assist Assist for lower body dressing:  (Mod assist)      Toileting Toileting Toileting activity did not occur: Safety/medical concerns   Toileting steps completed by helper: Adjust clothing prior to toileting, Performs perineal hygiene, Adjust clothing after toileting Toileting Assistive Devices: Toilet aid (urinal/bedpan)  Toileting assist Assist level: More than reasonable time, Set up/obtain supplies   Transfers Chair/bed transfer    Chair/bed transfer method: Lateral scoot Chair/bed transfer assist level: Moderate assist (Pt 50 - 74%/lift or lower) Chair/bed transfer assistive device: Sliding board Mechanical lift: Stedy   Locomotion Ambulation Ambulation activity did not occur: Safety/medical concerns         Wheelchair   Type: Manual Max wheelchair distance: 30 Assist Level: Touching or steadying assistance (Pt > 75%)  Cognition Comprehension Comprehension assist level: Follows basic conversation/direction with no assist  Expression Expression assist level: Expresses basic needs/ideas: With extra time/assistive device  Social Interaction Social Interaction assist level: Interacts appropriately 90% of the time - Needs monitoring or encouragement for participation or interaction.  Problem Solving Problem solving assist level: Solves basic 90% of the time/requires cueing < 10% of the time  Memory Memory assist level: Recognizes or recalls 75 - 89% of the time/requires cueing 10 - 24% of the time    Medical Problem List and Plan: 1. Functional deficitssecondary to left BKA  Cont CIR, 15/7  May weight bear through the heel on the right side  Ortho to evaluate for recs on staple removal  2. DVT Prophylaxis/Anticoagulation: Pharmaceutical: Heparin 3. Pain Management: Tylenol or Celebrex prn. Unable to tolerate muscle relaxers or pain medications due to SE of tremors, confusion and hallucinations.  4. Mood: LCSW to follow for evaluation and support.  5. Neuropsych: This patient is not fullycapable of making decisions on his own behalf. 6. Skin/Wound Care: Routine pressure relief measures. Prevalon boot for right foot. Monitor daily for intake to promote healing. Protein supplement and vitamins to promote wound healing.  7. Fluids/Electrolytes/Nutrition: Monitor I/O. Lytes/CBC per PD protocol.   Requires encouragement for nutrition  Remeron 15 mg started on 10/17 - improving 8. Leucocytosis: WBC has  fluctuated: Resolved   Monitor for fevers and other sign of infection.  Afebrile  WBCs 9.6 on 10/20  Cont to monitor 9. Hypotension: Monitor BP bid   Vitals:   01/28/17 0500 01/28/17 1500  BP: (!) 133/48 (!) 122/40  Pulse: 84 85  Resp: 18 18  Temp: 97.9 F (36.6 C) 98 F (36.7 C)  SpO2: 97% 100%   Midodrine decreased to daily on 10/16  Relatively controlled 10/19  Cont to monitor 10. ESRD on PD: Schedule PD in evening and early am--unhook by breakfast.  11. Anemia of chronic disease: On aranesp every week.   Hb 8.2 on 10/19  Cont to monitor 12. T2DM insulin dependent: Brittle diabetic who's hard to control in part due to inconsistent intake. Changed to Premier protein with meals.   NPH decreased to to 15u qam   NPH 60u qpm CBG (last 3)   Recent Labs  01/27/17 2153 01/28/17 0645 01/28/17 1220  GLUCAP 187* 168* 76   Labile, but overall controlled 10/20 13. Constipation: Continue Colace bid with lactulose prn.  14. PVD with s/p left femoral pseudoaneurysm repair.   Prevalon boot for RLE with pressure relief measures.  15. Delirium: ?Resolved   LOS (Days) 11 A FACE TO FACE EVALUATION WAS PERFORMED  Judie Hollick Karis Jubanil Lissy Deuser 01/28/2017 4:37 PM

## 2017-01-28 NOTE — Progress Notes (Signed)
Patient ID: [name], male   DOB: Sep 19, 1940, 76 y.o.   MRN: [id number]  [facility] Progress Note    Subjective:   Wife thinks he is doing better but he does not   Objective:   BP (!) 133/48 (BP Location: Right Arm)   Pulse 84   Temp 97.9 F (36.6 C) (Oral)   Resp 18   Ht 5\' 9"  (1.753 m)   Wt 67 kg (147 lb 11.3 oz)   SpO2 97%   BMI 21.81 kg/m   Intake/Output: I/O last 3 completed shifts: In: 82956 [P.O.:660; Other:12495] Out: 21308 [Other:12956]   Intake/Output this shift:  Total I/O In: 12613 [P.O.:118; Other:12495] Out: 65784 [Other:13446] Weight change:   Physical Exam: Gen:NAD CVS: no rub Resp:cta Abd: benign Ext: s/p LBKA, LUE AVF +T/B  Labs: BMET  Recent Labs Lab 01/23/17 0604 01/26/17 0612 01/26/17 0851  NA 132* 129* 131*  K 3.2* 3.6 3.6  CL 90* 88* 89*  CO2 28 29 29   GLUCOSE 288* 226* 181*  BUN 62* 72* 73*  CREATININE 7.18* 7.28* 7.46*  ALBUMIN 1.5* 1.6* 1.5*  CALCIUM 9.1 8.3* 8.3*  PHOS 3.8 4.9* 4.7*   CBC  Recent Labs Lab 01/23/17 0604 01/24/17 1401 01/27/17 0537  WBC 9.2 12.4* 9.6  NEUTROABS  --   --  7.6  HGB 9.1* 8.9* 8.2*  HCT 29.6* 28.5* 25.6*  MCV 98.7 97.6 97.3  PLT 507* 499* 464*    @IMGRELPRIORS @ Medications:    . aspirin  81 mg Oral Daily  . atorvastatin  80 mg Oral q1800  . calcium acetate  667 mg Oral TID WC  . carvedilol  12.5 mg Oral QHS  . clopidogrel  75 mg Oral Q breakfast  . darbepoetin (ARANESP) injection - NON-DIALYSIS  200 mcg Subcutaneous Q Thu-1800  . docusate sodium  100 mg Oral BID  . feeding supplement (PRO-STAT SUGAR FREE 64)  30 mL Oral BID  . gentamicin cream  1 application Topical BID  . gentamicin cream  1 application Topical Daily  . heparin subcutaneous  5,000 Units Subcutaneous Q8H  . insulin aspart  0-5 Units Subcutaneous QHS  . insulin aspart  0-9 Units Subcutaneous TID WC  . insulin NPH Human  15 Units Subcutaneous QAC breakfast   And  . insulin NPH Human  60  Units Subcutaneous QHS  . midodrine  5 mg Oral Q breakfast  . mirtazapine  15 mg Oral QHS  . multivitamin  1 tablet Oral QHS  . protein supplement shake  2 oz Oral TID WC  . senna-docusate  1 tablet Oral QHS  . [START ON 02/04/2017] Vitamin D (Ergocalciferol)  50,000 Units Oral Q30 days   Dialysis Orders:  Center[facility]. 6 exchanges/day one mid-day exchange (occurs around 9am).  2.5 liters of 2.5% per [facility] 2 liters of 2.5% for last fill and mid day drain. Pt says EDW "about 202" (pre admission) but now is around 148 lbs or 67kg. Dr. Denny Peon  Assessment/ Plan:   1. 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 ESRD continue with [facility] but due to dizziness and poor po intake changedPD fluid to 1/2 1.5% and 2.5% with improvement of symptoms today. Continue with 1.5/2.5% dialysate as he appears to be doing better with this. 3. Nausea/vomiting- possibly related to narcotics may want to consider phenergan or zofran before  meals. Also could be diabetic gastroparesis 4. 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 DM- per primary 7. Nutrition: renal diet 8. Hypertension: stable 9. Chest pain- s/p cath wtihout new occlusions or stenosis 10. 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 Disposition- continue with CIR  Joshua CordsJoseph A. Hildy Nicholl, MD [facility], [facility] Pager [phone] 01/28/2017, 10:25 AM

## 2017-01-28 NOTE — Progress Notes (Addendum)
Occupational Therapy Session Note  Patient Details  Name: [name] MRN: [id number] Date of Birth: February 24, 1941  Today's Date: 01/28/2017 OT Individual Time: 0830-0900 and 1315-1345 OT Individual Time Calculation (min): 30 min  and 30 min  Today's Date: 01/28/2017 OT Missed Time: 30 Minutes and 15 min Missed Time Reason: Unavailable (comment) (pt wanted to finish breakfast and then RN needed to disconnect PD); missed 15 min 2nd session due to late lunch   Short Term Goals: Week 1:  OT Short Term Goal 1 (Week 1): Pt will transfer to toilet with mod assist squat pivot vs slide board OT Short Term Goal 1 - Progress (Week 1): Progressing toward goal OT Short Term Goal 2 (Week 1): Pt will perform sit to stand with mod A in prep for clothing management OT Short Term Goal 2 - Progress (Week 1): Not met OT Short Term Goal 3 (Week 1): Pt will don pants with mod assist with lateral leans OT Short Term Goal 3 - Progress (Week 1): Met OT Short Term Goal 4 (Week 1): Pt will navigate w/c around room to obtain clothing for ADL with min A  OT Short Term Goal 4 - Progress (Week 1): Met Week 2:  OT Short Term Goal 1 (Week 2): Pt will transfer to drop arm commode with slide board mod assist OT Short Term Goal 2 (Week 2): Pt will perform sit to stand with mod A in prep for clothing management OT Short Term Goal 3 (Week 2): Pt will don pants in sitting with lateral leans to pull pants over hips post toileting with min assist  Skilled Therapeutic Interventions/Progress Updates:    Visit 1:  Pain: c/o pain in R foot Pt sitting at EOB with wife and RN in room. Wife reports that he was able to sit to EOB by himself. Pt eating his breakfast and wanted to finish.  Pt therapy resumed 20 minutes later.  When therapist returned to room, RN had arrived to disconnect pt from PD.  Therefore, pt missed a full 30 min of therapy.  Pt then engaged in LB bathing from bed level in supine with assist to fully wash bottom.  He donned pants in supine with assist to fully don over legs as he had pain in L limb and R foot. Rolling in bed to don over hips. He sat to EOB with close S.  Used slide board to w/c with mod A with max cuing to lean forward. Even with total A to guide pt into a forward lean he resisted the movement and it was difficulty to guide him.  He was able to get in chair by literally sliding himself in small scoots vs using a more efficient motor pattern.  Pt in w/c and his wife stated she will assist him at sink with grooming and UB dressing.  Visit 2:  Pain: Pt continues to have R foot pain At start of session, pt had just received his lunch and he wanted to finish it completely. Therapy resumed 15 min later.  Worked on SB transfers in the same method as this morning but pt needed more A to fully complete the transfer.  Pt taken to gym to work on sit to partial squat pushing up with B arms, but was unable to achieve even with max A.  He continued to guard himself against a forward lean.   Pt then worked on a forward reaching activity hooking horseshoes to a bar and pt was able to  reach fully forward.  Educated pt on the need to get comfortable with that movement pattern so he can weight shift and transfer more easily. Pt taken back to room with spouse in the room with him.    Visit 2:  Pain:  Therapy Documentation Precautions:  Precautions Precautions: Fall Precaution Comments: PD port, VAC left residual limb Restrictions Weight Bearing Restrictions: Yes LLE Weight Bearing: Non weight bearing LLE Partial Weight Bearing Percentage or Pounds: Darco shoe    Vital Signs: Therapy Vitals Temp: 97.9 F (36.6 C) Temp Source: Oral Pulse Rate: 84 Resp: 18 BP: (!) 133/48 Patient Position (if appropriate): Lying Oxygen Therapy SpO2: 97 % O2 Device: Not Delivered    ADL:     See Function Navigator for Current Functional Status.   Therapy/Group: Individual Therapy  [place] 01/28/2017,  7:56 AM

## 2017-01-28 NOTE — Progress Notes (Signed)
Physical Therapy Session Note  Patient Details  Name: [name] MRN: [id number] Date of Birth: 1940/08/04  Today's Date: 01/28/2017 PT Individual Time: 1010-1050 PT Individual Time Calculation (min): 40 min   Short Term Goals: Week 2:     Skilled Therapeutic Interventions/Progress Updates:    c/o 8/10 pain, nurse in at start of session to premedicate.  Session focus on strengthening and activity tolerance.    Pt completes arm bike x5 minutes forward at level 1 with verbal cues for maintaining pace for activity tolerance.  Seated ball taps with 2# rod for UE strength and endurance.  2x10 tricep extension with level 4 theraband (PT graded resistance).    Pt returned to room at end of session, left upright with call bell in reach and needs met.   Therapy Documentation Precautions:  Precautions Precautions: Fall Precaution Comments: PD port, VAC left residual limb Restrictions Weight Bearing Restrictions: Yes LLE Weight Bearing: Non weight bearing LLE Partial Weight Bearing Percentage or Pounds: Darco shoe   See Function Navigator for Current Functional Status.   Therapy/Group: Individual Therapy  Michel Santee 01/28/2017, 12:39 PM

## 2017-01-29 ENCOUNTER — Inpatient Hospital Stay (HOSPITAL_COMMUNITY): Payer: Medicare Other | Admitting: Occupational Therapy

## 2017-01-29 ENCOUNTER — Inpatient Hospital Stay (HOSPITAL_COMMUNITY): Payer: Medicare Other

## 2017-01-29 LAB — CBC
HEMATOCRIT: 28 % — AB (ref 39.0–52.0)
Hemoglobin: 8.9 g/dL — ABNORMAL LOW (ref 13.0–17.0)
MCH: 30.5 pg (ref 26.0–34.0)
MCHC: 31.8 g/dL (ref 30.0–36.0)
MCV: 95.9 fL (ref 78.0–100.0)
PLATELETS: 464 10*3/uL — AB (ref 150–400)
RBC: 2.92 MIL/uL — AB (ref 4.22–5.81)
RDW: 16.1 % — AB (ref 11.5–15.5)
WBC: 10.2 10*3/uL (ref 4.0–10.5)

## 2017-01-29 LAB — RENAL FUNCTION PANEL
ALBUMIN: 1.6 g/dL — AB (ref 3.5–5.0)
Anion gap: 12 (ref 5–15)
BUN: 80 mg/dL — AB (ref 6–20)
CO2: 26 mmol/L (ref 22–32)
CREATININE: 7.32 mg/dL — AB (ref 0.61–1.24)
Calcium: 8.1 mg/dL — ABNORMAL LOW (ref 8.9–10.3)
Chloride: 89 mmol/L — ABNORMAL LOW (ref 101–111)
GFR, EST AFRICAN AMERICAN: 7 mL/min — AB (ref >60)
GFR, EST NON AFRICAN AMERICAN: 6 mL/min — AB (ref >60)
Glucose, Bld: 113 mg/dL — ABNORMAL HIGH (ref 65–99)
PHOSPHORUS: 4.2 mg/dL (ref 2.5–4.6)
POTASSIUM: 3.4 mmol/L — AB (ref 3.5–5.1)
Sodium: 127 mmol/L — ABNORMAL LOW (ref 135–145)

## 2017-01-29 LAB — GLUCOSE, CAPILLARY
GLUCOSE-CAPILLARY: 115 mg/dL — AB (ref 65–99)
GLUCOSE-CAPILLARY: 108 mg/dL — AB (ref 65–99)
GLUCOSE-CAPILLARY: 77 mg/dL (ref 65–99)
GLUCOSE-CAPILLARY: 268 mg/dL — AB (ref 65–99)

## 2017-01-29 NOTE — Plan of Care (Signed)
Problem: RH BOWEL ELIMINATION Goal: RH STG MANAGE BOWEL WITH ASSISTANCE STG Manage Bowel with mod I Assistance.  Outcome: Not Progressing Last BM 01/26/17. PRN Laxative given Goal: RH STG MANAGE BOWEL W/MEDICATION W/ASSISTANCE STG Manage Bowel with Medication with mod I Assistance.  Outcome: Not Progressing Last BM 01/26/17. PRN Laxative given

## 2017-01-29 NOTE — Progress Notes (Signed)
Patient ID: [name], male   DOB: 04-19-1940, 76 y.o.   MRN: [id number] S: Feels much better today, able to eat breakfast without N/V O:BP (!) 140/95 (BP Location: Right Wrist)   Pulse 86   Temp 98.5 F (36.9 C) (Axillary)   Resp (!) 22   Ht 5\' 9"  (1.753 m)   Wt 76 kg (167 lb 8.8 oz)   SpO2 100%   BMI 24.74 kg/m   Intake/Output Summary (Last 24 hours) at 01/29/17 1153 Last data filed at 01/29/17 0916  Gross per 24 hour  Intake              480 ml  Output                0 ml  Net              480 ml   Intake/Output: I/O last 3 completed shifts: In: 40981 [P.O.:358; Other:12495] Out: 19147 [Other:13446]  Intake/Output this shift:  Total I/O In: 240 [P.O.:240] Out: -  Weight change: 9 kg (19 lb 13.5 oz) Gen: NAD CVS: no rub Resp:cta Abd: benign Ext: no edema, s/p LBKA, LUAVF +T/B   Recent Labs Lab 01/23/17 0604 01/26/17 0612 01/26/17 0851  NA 132* 129* 131*  K 3.2* 3.6 3.6  CL 90* 88* 89*  CO2 28 29 29   GLUCOSE 288* 226* 181*  BUN 62* 72* 73*  CREATININE 7.18* 7.28* 7.46*  ALBUMIN 1.5* 1.6* 1.5*  CALCIUM 9.1 8.3* 8.3*  PHOS 3.8 4.9* 4.7*   Liver Function Tests:  Recent Labs Lab 01/23/17 0604 01/26/17 0612 01/26/17 0851  ALBUMIN 1.5* 1.6* 1.5*   No results for input(s): LIPASE, AMYLASE in the last 168 hours. No results for input(s): AMMONIA in the last 168 hours. CBC:  Recent Labs Lab 01/23/17 0604 01/24/17 1401 01/27/17 0537  WBC 9.2 12.4* 9.6  NEUTROABS  --   --  7.6  HGB 9.1* 8.9* 8.2*  HCT 29.6* 28.5* 25.6*  MCV 98.7 97.6 97.3  PLT 507* 499* 464*   Cardiac Enzymes: No results for input(s): CKTOTAL, CKMB, CKMBINDEX, TROPONINI in the last 168 hours. CBG:  Recent Labs Lab 01/27/17 2153 01/28/17 0645 01/28/17 1220 01/28/17 2136 01/29/17 0551  GLUCAP 187* 168* 76 207* 115*    Iron Studies: No results for input(s): IRON, TIBC, TRANSFERRIN, FERRITIN in the last 72 hours. Studies/Results: No results found. [name] aspirin  81 mg  Oral Daily  . atorvastatin  80 mg Oral q1800  . calcium acetate  667 mg Oral TID WC  . carvedilol  12.5 mg Oral QHS  . clopidogrel  75 mg Oral Q breakfast  . darbepoetin (ARANESP) injection - NON-DIALYSIS  200 mcg Subcutaneous Q Thu-1800  . docusate sodium  100 mg Oral BID  . feeding supplement (PRO-STAT SUGAR FREE 64)  30 mL Oral BID  . gentamicin cream  1 application Topical BID  . gentamicin cream  1 application Topical Daily  . heparin subcutaneous  5,000 Units Subcutaneous Q8H  . insulin aspart  0-5 Units Subcutaneous QHS  . insulin aspart  0-9 Units Subcutaneous TID WC  . insulin NPH Human  15 Units Subcutaneous QAC breakfast   And  . insulin NPH Human  60 Units Subcutaneous QHS  . midodrine  5 mg Oral Q breakfast  . mirtazapine  15 mg Oral QHS  . multivitamin  1 tablet Oral QHS  . protein supplement shake  2 oz Oral TID WC  . senna-docusate  1  tablet Oral QHS  . [START ON 02/04/2017] Vitamin D (Ergocalciferol)  50,000 Units Oral Q30 days    BMET    Component Value Date/Time   NA 131 (L) 01/26/2017 0851   K 3.6 01/26/2017 0851   CL 89 (L) 01/26/2017 0851   CO2 29 01/26/2017 0851   GLUCOSE 181 (H) 01/26/2017 0851   BUN 73 (H) 01/26/2017 0851   CREATININE 7.46 (H) 01/26/2017 0851   CALCIUM 8.3 (L) 01/26/2017 0851   GFRNONAA 6 (L) 01/26/2017 0851   GFRAA 7 (L) 01/26/2017 0851   CBC    Component Value Date/Time   WBC 9.6 01/27/2017 0537   RBC 2.63 (L) 01/27/2017 0537   HGB 8.2 (L) 01/27/2017 0537   HCT 25.6 (L) 01/27/2017 0537   PLT 464 (H) 01/27/2017 0537   MCV 97.3 01/27/2017 0537   MCH 31.2 01/27/2017 0537   MCHC 32.0 01/27/2017 0537   RDW 16.5 (H) 01/27/2017 0537   LYMPHSABS 1.1 01/27/2017 0537   MONOABS 0.7 01/27/2017 0537   EOSABS 0.2 01/27/2017 0537   BASOSABS 0.0 01/27/2017 0537    Dialysis Orders:  [facility] [place] [facility] 6 exchanges/day one mid-day exchange (occurs around 9am).  2.5 liters of 2.5% per [facility] 2 liters of 2.5% for  last fill and mid day drain. Pt says EDW "about 202" (pre admission) but now is around 148 lbs or 67kg. Dr. Denny PeonBaveja  Assessment/ Plan:   1. 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 ESRD continue with CCPD but due to dizziness and poor po intake changedPD fluid to 1/2 1.5% and 2.5% with improvement of symptoms today. Continue with 1.5/2.5% dialysate as he appears to be doing better with this. 3. Nausea/vomiting- possibly related to narcotics may want to consider phenergan or zofran before meals. Also could be diabetic gastroparesis 4. 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 DM- per primary 7. Protein malnutrition: renal diet and Nepro tid for supplements 8. Hypertension: stable 9. Chest pain- s/p cath wtihout new occlusions or stenosis 10. 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 Disposition- continue with CIR   Joshua CordsJoseph A. Gavin Faivre, MD [facility] [phone]

## 2017-01-29 NOTE — Progress Notes (Signed)
Occupational Therapy Session Note  Patient Details  Name: [name] MRN: [id number] Date of Birth: 1941-02-10  Today's Date: 01/29/2017 OT Individual Time: 1000-1100 OT Individual Time Calculation (min): 60 min   2nd session: 13:00-1345 (45 min    Short Term Goals: Week 1:  OT Short Term Goal 1 (Week 1): Pt will transfer to toilet with mod assist squat pivot vs slide board OT Short Term Goal 1 - Progress (Week 1): Progressing toward goal OT Short Term Goal 2 (Week 1): Pt will perform sit to stand with mod A in prep for clothing management OT Short Term Goal 2 - Progress (Week 1): Not met OT Short Term Goal 3 (Week 1): Pt will don pants with mod assist with lateral leans OT Short Term Goal 3 - Progress (Week 1): Met OT Short Term Goal 4 (Week 1): Pt will navigate w/c around room to obtain clothing for ADL with min A  OT Short Term Goal 4 - Progress (Week 1): Met Week 2:  OT Short Term Goal 1 (Week 2): Pt will transfer to drop arm commode with slide board mod assist OT Short Term Goal 2 (Week 2): Pt will perform sit to stand with mod A in prep for clothing management OT Short Term Goal 3 (Week 2): Pt will don pants in sitting with lateral leans to pull pants over hips post toileting with min assist Week 3:     Skilled Therapeutic Interventions/Progress Updates:    1:1 Pt in bed when arrived. Focused on doffing / donning clothing and bathing LB at bed level with min A to sustained sidelying position. Pt required multiple rest breaks throughout session.  Pt does required A to don sock on right foot- heel does have a red spot on heel and heel protectant bandage added to heel. Pt able to successful thread and pull up underwear and pants today.  Min A to come to EOB with bed rail. Min A with more than reasonable amt of time to transfer into w/c via slide board. Pt reports he does not want a hospital bed. Discussed need of getting a bed rail due to reliance on it. Wife also reports bed  (once adjusted) will be 22 inches off floor.  Performed a transfer at this height into the w/c. Pt perform UB bathing and dressing at sink with setup and encouragement for activity tolerance. Lab came to draw for labs and pt able to tolerate sitting EOb with UE support ~10 min.   2nd session 1:1 focus on toilet transfers to drop arm commode. Pt performed slide board transfer with mod A to BSC from w/c with more than reasonable amt of time. PT able to get his pants and underwear down with extra time, perform hygiene with supervision. A to pull up pants but pt able to boost himself up enough for a caregiver/ Ot to pull them up with one pull (in partial stance).  Pt able to perform squat pivot with instructional cues with mod A utilizing less energy then on board. Pt then performed transfer on/ off BSC another time with mod A without slide board.  Mod A squat pivot transfer into recliner and partial stand with stedy to place w/c cushion under him for pressure relief. Wife present for session and goal is for next session for her to perform transfers to begin hands on training.  Therapy Documentation Precautions:  Precautions Precautions: Fall Precaution Comments: PD port, VAC left residual limb Restrictions Weight Bearing Restrictions: Yes LLE  Weight Bearing: Non weight bearing LLE Partial Weight Bearing Percentage or Pounds: Darco shoe Pain:  going c/o pain - reported got meds before session for pain in residual limb and site of right toe amputation   See Function Navigator for Current Functional Status.   Therapy/Group: Individual Therapy  Willeen Cass [facility] 01/29/2017, 11:56 AM

## 2017-01-29 NOTE — Progress Notes (Signed)
[facility] PHYSICAL MEDICINE & REHABILITATION     PROGRESS NOTE  Subjective/Complaints:  Pt seen laying in bed this AM.  Pt states he slept well.  Per wife, pt's appetite improving.   ROS: Denies nausea, vomiting, diarrhea, shortness of breath or chest pain   Objective: Vital Signs: Blood pressure (!) 140/95, pulse 86, temperature 98.5 F (36.9 C), temperature source Axillary, resp. rate (!) 22, height 5\' 9"  (1.753 m), weight 76 kg (167 lb 8.8 oz), SpO2 100 %. No results found.  Recent Labs  01/27/17 0537  WBC 9.6  HGB 8.2*  HCT 25.6*  PLT 464*    Recent Labs  01/26/17 0851  NA 131*  K 3.6  CL 89*  GLUCOSE 181*  BUN 73*  CREATININE 7.46*  CALCIUM 8.3*   CBG (last 3)   Recent Labs  01/28/17 1220 01/28/17 2136 01/29/17 0551  GLUCAP 76 207* 115*    Wt Readings from Last 3 Encounters:  01/29/17 76 kg (167 lb 8.8 oz)  01/17/17 86.7 kg (191 lb 2.2 oz)  01/05/17 87.9 kg (193 lb 12.6 oz)    Physical Exam:  BP (!) 140/95 (BP Location: Right Wrist)   Pulse 86   Temp 98.5 F (36.9 C) (Axillary)   Resp (!) 22   Ht 5\' 9"  (1.753 m)   Wt 76 kg (167 lb 8.8 oz)   SpO2 100%   BMI 24.74 kg/m  Constitutional: He appears well-nourished. NAD. Obese. HENT: Normocephalic. Areas of abrasion healing.  Eyes: EOMI. No discharge.  Cardiovascular: RRR. No JVD . Respiratory: Effort normal and breath sounds normal.  GI: Soft. Bowel sounds are normal.   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  Motor: 4+/5 grossly throughout (stable) Skin:  BKA incision with dressing c/d/i Psychiatric: His speech is normal. He is withdrawn.   Assessment/Plan: 1. Functional deficits secondary to left BKA which require 3+ hours per day of interdisciplinary therapy in a comprehensive inpatient rehab setting. Physiatrist is providing close team supervision and 24 hour management of active medical problems listed  below. Physiatrist and rehab team continue to assess barriers to discharge/monitor patient progress toward functional and medical goals.  Function:  Bathing Bathing position   Position: Bed (bedlevel for LB, seated EOB for UB)  Bathing parts Body parts bathed by patient: Front perineal area (LB bathing only today with OT) Body parts bathed by helper: Buttocks, Right lower leg  Bathing assist Assist Level:  (Mod assist)      Upper Body Dressing/Undressing Upper body dressing   What is the patient wearing?: Pull over shirt/dress     Pull over shirt/dress - Perfomed by patient: Thread/unthread right sleeve, Thread/unthread left sleeve, Put head through opening Pull over shirt/dress - Perfomed by helper: Pull shirt over trunk        Upper body assist Assist Level: Touching or steadying assistance(Pt > 75%)      Lower Body Dressing/Undressing Lower body dressing   What is the patient wearing?: Pants, Non-skid slipper socks Underwear - Performed by patient: Thread/unthread left underwear leg, Pull underwear up/down Underwear - Performed by helper: Thread/unthread right underwear leg Pants- Performed by patient: Thread/unthread right pants leg, Pull pants up/down Pants- Performed by helper: Thread/unthread left pants leg   Non-skid slipper socks- Performed by helper: Don/doff right sock       Shoes - Performed by helper: Don/doff right shoe          Lower body assist  Assist for lower body dressing:  (Mod assist)      Toileting Toileting Toileting activity did not occur: Safety/medical concerns   Toileting steps completed by helper: Adjust clothing prior to toileting, Performs perineal hygiene, Adjust clothing after toileting Toileting Assistive Devices: Toilet aid (urinal/bedpan)  Toileting assist Assist level: More than reasonable time, Set up/obtain supplies   Transfers Chair/bed transfer   Chair/bed transfer method: Lateral scoot Chair/bed transfer assist level:  Moderate assist (Pt 50 - 74%/lift or lower) Chair/bed transfer assistive device: Sliding board Mechanical lift: Stedy   Locomotion Ambulation Ambulation activity did not occur: Safety/medical concerns         Wheelchair   Type: Manual Max wheelchair distance: 30 Assist Level: Touching or steadying assistance (Pt > 75%)  Cognition Comprehension Comprehension assist level: Follows basic conversation/direction with no assist  Expression Expression assist level: Expresses basic needs/ideas: With extra time/assistive device  Social Interaction Social Interaction assist level: Interacts appropriately 90% of the time - Needs monitoring or encouragement for participation or interaction.  Problem Solving Problem solving assist level: Solves basic 90% of the time/requires cueing < 10% of the time  Memory Memory assist level: Recognizes or recalls 75 - 89% of the time/requires cueing 10 - 24% of the time    Medical Problem List and Plan: 1. Functional deficitssecondary to left BKA  Cont CIR, 15/7  May weight bear through the heel on the right side 2. DVT Prophylaxis/Anticoagulation: Pharmaceutical: Heparin 3. Pain Management: Tylenol or Celebrex prn. Unable to tolerate muscle relaxers or pain medications due to SE of tremors, confusion and hallucinations.  4. Mood: LCSW to follow for evaluation and support.  5. Neuropsych: This patient is not fullycapable of making decisions on his own behalf. 6. Skin/Wound Care: Routine pressure relief measures. Prevalon boot for right foot. Monitor daily for intake to promote healing. Protein supplement and vitamins to promote wound healing.  7. Fluids/Electrolytes/Nutrition: Monitor I/O. Lytes/CBC per PD protocol.   Requires encouragement for nutrition  Remeron 15 mg started on 10/17 - continues to slowly improve 8. Leucocytosis: WBC has fluctuated: Resolved   Monitor for fevers and other sign of infection.  Afebrile  WBCs 9.6 on 10/19  Cont to  monitor 9. Hypotension: Monitor BP bid   Vitals:   01/28/17 2042 01/29/17 0505  BP: (!) 133/56 (!) 140/95  Pulse: 86 86  Resp:  (!) 22  Temp:  98.5 F (36.9 C)  SpO2:     Midodrine decreased to daily on 10/16  Relatively controlled 10/21  Cont to monitor 10. ESRD on PD: Schedule PD in evening and early am--unhook by breakfast.  11. Anemia of chronic disease: On aranesp every week.   Hb 8.2 on 10/19  Cont to monitor 12. T2DM insulin dependent: Brittle diabetic who's hard to control in part due to inconsistent intake. Changed to Premier protein with meals.   NPH decreased to to 15u qam   NPH 60u qpm CBG (last 3)   Recent Labs  01/28/17 1220 01/28/17 2136 01/29/17 0551  GLUCAP 76 207* 115*   Labile, but overall controlled 10/21 13. Constipation: Continue Colace bid with lactulose prn.  14. PVD with s/p left femoral pseudoaneurysm repair.   Prevalon boot for RLE with pressure relief measures.  15. Delirium: ?Resolved   LOS (Days) 12 A FACE TO FACE EVALUATION WAS PERFORMED  Ankit Karis Juba 01/29/2017 7:51 AM

## 2017-01-29 NOTE — Progress Notes (Signed)
Physical Therapy Session Note  Patient Details  Name: Joshua [name] MRN: [id number] Date of Birth: 1940-05-01  Today's Date: 01/29/2017 PT Individual Time: [id number] PT Individual Time Calculation (min): 43 min    Skilled Therapeutic Interventions/Progress Updates:    Pt seated in recliner upon PT arrival, agreeable to therapy tx and reports pain L distal limb 8/10 and R foot/knee 6/10. Pt performed squat pivot transfer from recliner>w/c with mod assist. Pt propelled w/c 2 x 50 ft bouts with rest breaks between using B UEs, increased time to complete, working on endurance. Pt reports feeling very fatigued this afternoon, agreeable to seated therex. Pt performed B LE strengthening: 2 x 10 LAQ, 2 x 10 hip flexion, 2 x 10 adductor ball squeezes, 2 x 10 hip abduction with level 1 TB. Pt performed UE strengthening exercises with 2# dowel: 2 x 10 shoulder flexion, 2 x 10 bicep curls and 2 x 10 chest press. Pt left seated in w/c at end of session with needs in reach and wife present.   Therapy Documentation Precautions:  Precautions Precautions: Fall Precaution Comments: PD port, VAC left residual limb Restrictions Weight Bearing Restrictions: Yes LLE Weight Bearing: Non weight bearing LLE Partial Weight Bearing Percentage or Pounds: Darco shoe   See Function Navigator for Current Functional Status.   Therapy/Group: Individual Therapy  Cresenciano GenreEmily van Schagen, PT, DPT 01/29/2017, 4:37 PM

## 2017-01-30 ENCOUNTER — Inpatient Hospital Stay (HOSPITAL_COMMUNITY): Payer: Medicare Other | Admitting: Occupational Therapy

## 2017-01-30 ENCOUNTER — Inpatient Hospital Stay (HOSPITAL_COMMUNITY): Payer: Medicare Other | Admitting: Physical Therapy

## 2017-01-30 DIAGNOSIS — K5901 Slow transit constipation: Secondary | ICD-10-CM

## 2017-01-30 LAB — RENAL FUNCTION PANEL
ALBUMIN: 1.7 g/dL — AB (ref 3.5–5.0)
ANION GAP: 15 (ref 5–15)
BUN: 75 mg/dL — ABNORMAL HIGH (ref 6–20)
CALCIUM: 8.3 mg/dL — AB (ref 8.9–10.3)
CO2: 25 mmol/L (ref 22–32)
Chloride: 88 mmol/L — ABNORMAL LOW (ref 101–111)
Creatinine, Ser: 6.75 mg/dL — ABNORMAL HIGH (ref 0.61–1.24)
GFR, EST AFRICAN AMERICAN: 8 mL/min — AB (ref >60)
GFR, EST NON AFRICAN AMERICAN: 7 mL/min — AB (ref >60)
GLUCOSE: 223 mg/dL — AB (ref 65–99)
PHOSPHORUS: 4.5 mg/dL (ref 2.5–4.6)
POTASSIUM: 3.4 mmol/L — AB (ref 3.5–5.1)
SODIUM: 128 mmol/L — AB (ref 135–145)

## 2017-01-30 LAB — GLUCOSE, CAPILLARY
GLUCOSE-CAPILLARY: 91 mg/dL (ref 65–99)
GLUCOSE-CAPILLARY: 233 mg/dL — AB (ref 65–99)
GLUCOSE-CAPILLARY: 189 mg/dL — AB (ref 65–99)
GLUCOSE-CAPILLARY: 189 mg/dL — AB (ref 65–99)
Glucose-Capillary: 75 mg/dL (ref 65–99)

## 2017-01-30 MED ORDER — SENNOSIDES-DOCUSATE SODIUM 8.6-50 MG PO TABS
2.0000 | ORAL_TABLET | Freq: Every day | ORAL | Status: DC
  Administered 2017-01-30 – 2017-01-31 (×2): 2 via ORAL
  Filled 2017-01-30 (×2): qty 2

## 2017-01-30 MED ORDER — CARVEDILOL 6.25 MG PO TABS
6.2500 mg | ORAL_TABLET | Freq: Every day | ORAL | Status: DC
  Administered 2017-01-30 – 2017-02-06 (×8): 6.25 mg via ORAL
  Filled 2017-01-30 (×8): qty 1

## 2017-01-30 NOTE — Progress Notes (Signed)
[name]     PROGRESS NOTE  Subjective/Complaints:  Pt seen laying in bed this AM.  He slept well overnight.  Wife notes constipation.    ROS: Denies nausea, vomiting, diarrhea, shortness of breath or chest pain   Objective: Vital Signs: Blood pressure (!) 157/52, pulse 86, temperature 99.3 F (37.4 C), temperature source Axillary, resp. rate 20, height 5\' 9"  (1.753 m), weight 74 kg (163 lb 2.3 oz), SpO2 100 %. No results found.  Recent Labs  01/29/17 1037  WBC 10.2  HGB 8.9*  HCT 28.0*  PLT 464*    Recent Labs  01/29/17 1037 01/30/17 0637  NA 127* 128*  K 3.4* 3.4*  CL 89* 88*  GLUCOSE 113* 223*  BUN 80* 75*  CREATININE 7.32* 6.75*  CALCIUM 8.1* 8.3*   CBG (last 3)   Recent Labs  01/29/17 1704 01/29/17 2101 01/30/17 0609  GLUCAP 77 268* 233*    Wt Readings from Last 3 Encounters:  01/30/17 74 kg (163 lb 2.3 oz)  01/17/17 86.7 kg (191 lb 2.2 oz)  01/05/17 87.9 kg (193 lb 12.6 oz)    Physical Exam:  BP (!) 157/52 (BP Location: Right Wrist)   Pulse 86   Temp 99.3 F (37.4 C) (Axillary)   Resp 20   Ht 5\' 9"  (1.753 m)   Wt 74 kg (163 lb 2.3 oz)   SpO2 100%   BMI 24.09 kg/m  Constitutional: He appears well-nourished. NAD. Obese. HENT: Normocephalic. Areas of abrasion healing.  Eyes: EOMI. No discharge.  Cardiovascular: RRR. No JVD . Respiratory: Effort normal and breath sounds normal.  GI: Soft. Bowel sounds are normal.   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  Motor: 4+/5 grossly throughout (unchanged) Skin:  BKA incision with sanguinous drainage medial stump Psychiatric: His speech is normal. He is withdrawn.   Assessment/Plan: 1. Functional deficits secondary to left BKA which require 3+ hours per day of interdisciplinary therapy in a comprehensive inpatient rehab setting. Physiatrist is providing close team supervision and 24 hour  management of active medical problems listed below. Physiatrist and rehab team continue to assess barriers to discharge/monitor patient progress toward functional and medical goals.  Function:  Bathing Bathing position   Position: Bed (bedlevel for LB, seated EOB for UB)  Bathing parts Body parts bathed by patient: Front perineal area (LB bathing only today with OT) Body parts bathed by helper: Buttocks, Right lower leg  Bathing assist Assist Level:  (Mod assist)      Upper Body Dressing/Undressing Upper body dressing   What is the patient wearing?: Pull over shirt/dress     Pull over shirt/dress - Perfomed by patient: Thread/unthread right sleeve, Thread/unthread left sleeve, Put head through opening Pull over shirt/dress - Perfomed by helper: Pull shirt over trunk        Upper body assist Assist Level: Touching or steadying assistance(Pt > 75%)      Lower Body Dressing/Undressing Lower body dressing   What is the patient wearing?: Pants, Non-skid slipper socks Underwear - Performed by patient: Thread/unthread left underwear leg, Pull underwear up/down Underwear - Performed by helper: Thread/unthread right underwear leg Pants- Performed by patient: Thread/unthread right pants leg, Pull pants up/down Pants- Performed by helper: Thread/unthread left pants leg   Non-skid slipper socks- Performed by helper: Don/doff right sock       Shoes - Performed by helper: Don/doff right shoe  Lower body assist Assist for lower body dressing:  (Mod assist)      Toileting Toileting Toileting activity did not occur: Safety/medical concerns   Toileting steps completed by helper: Adjust clothing prior to toileting, Performs perineal hygiene, Adjust clothing after toileting Toileting Assistive Devices: Toilet aid (urinal/bedpan)  Toileting assist Assist level: More than reasonable time, Set up/obtain supplies   Transfers Chair/bed transfer   Chair/bed transfer method:  Lateral scoot Chair/bed transfer assist level: Moderate assist (Pt 50 - 74%/lift or lower) Chair/bed transfer assistive device: Armrests Mechanical lift: Stedy   Locomotion Ambulation Ambulation activity did not occur: Safety/medical concerns         Wheelchair   Type: Manual Max wheelchair distance: 50 ft Assist Level: Supervision or verbal cues  Cognition Comprehension Comprehension assist level: Follows basic conversation/direction with no assist  Expression Expression assist level: Expresses basic needs/ideas: With extra time/assistive device  Social Interaction Social Interaction assist level: Interacts appropriately 90% of the time - Needs monitoring or encouragement for participation or interaction.  Problem Solving Problem solving assist level: Solves basic 90% of the time/requires cueing < 10% of the time  Memory Memory assist level: Recognizes or recalls 75 - 89% of the time/requires cueing 10 - 24% of the time    Medical Problem List and Plan: 1. Functional deficitssecondary to left BKA  Cont CIR, 15/7  May weight bear through the heel on the right side 2. DVT Prophylaxis/Anticoagulation: Pharmaceutical: Heparin 3. Pain Management: Tylenol or Celebrex prn. Unable to tolerate muscle relaxers or pain medications due to SE of tremors, confusion and hallucinations.  4. Mood: LCSW to follow for evaluation and support.  5. Neuropsych: This patient is not fullycapable of making decisions on his own behalf. 6. Skin/Wound Care: Routine pressure relief measures. Prevalon boot for right foot. Monitor daily for intake to promote healing. Protein supplement and vitamins to promote wound healing.  7. Fluids/Electrolytes/Nutrition: Monitor I/O. Lytes/CBC per PD protocol.   Requires encouragement for nutrition  Remeron 15 mg started on 10/17 - continues to slowly improve 8. Leucocytosis: Resolved   Monitor for fevers and other sign of infection.  Afebrile  WBCs 10.2 on  10/21  Cont to monitor 9. Hypotension: Monitor BP bid   Vitals:   01/29/17 2213 01/30/17 0521  BP: 130/60 (!) 157/52  Pulse: 88 86  Resp:  20  Temp:  99.3 F (37.4 C)  SpO2:  100%   Midodrine d/ced 10/22  Cont to monitor 10. ESRD on PD: Schedule PD in evening and early am--unhook by breakfast.  11. Anemia of chronic disease: On aranesp every week.   Hb 8.2 on 10/19  Cont to monitor 12. T2DM insulin dependent: Brittle diabetic who's hard to control in part due to inconsistent intake. Changed to Premier protein with meals.   NPH decreased to to 15u qam   NPH 60u qpm CBG (last 3)   Recent Labs  01/29/17 1704 01/29/17 2101 01/30/17 0609  GLUCAP 77 268* 233*   Elevated this AM, cont to monitor 13. Constipation: Bowel reg increased on 10/22 14. PVD with s/p left femoral pseudoaneurysm repair.   Prevalon boot for RLE with pressure relief measures.  15. Delirium: ?Resolved  LOS (Days) 13 A FACE TO FACE EVALUATION WAS PERFORMED  [name] [name] [name] 01/30/2017 8:56 AM

## 2017-01-30 NOTE — Progress Notes (Signed)
Subjective: Interval History: has no complaint . Doing better with less dizziness.  Objective: Vital signs in last 24 hours: Temp:  [98.2 F (36.8 C)-99.3 F (37.4 C)] 99.3 F (37.4 C) (10/22 0521) Pulse Rate:  [82-88] 86 (10/22 0521) Resp:  [20] 20 (10/22 0521) BP: (120-157)/(52-60) 157/52 (10/22 0521) SpO2:  [96 %-100 %] 100 % (10/22 0521) Weight:  [74 kg (163 lb 2.3 oz)] 74 kg (163 lb 2.3 oz) (10/22 0521) Weight change: -2 kg (-4 lb 6.5 oz)  Intake/Output from previous day: 10/21 0701 - 10/22 0700 In: [id number] [P.O.:480] Out: 13314  Intake/Output this shift: No intake/output data recorded.  General appearance: alert, cooperative, no distress, moderately obese and pale Resp: diminished breath sounds bilaterally Cardio: S1, S2 normal and systolic murmur: systolic ejection 2/6, decrescendo at 2nd left intercostal space GI: obese, pos bs, PD cath L mid abdm, pos FW 1+remities: edema 1+ and L amp, R shoe to protect  Lab Results:  Recent Labs  01/29/17 1037  WBC 10.2  HGB 8.9*  HCT 28.0*  PLT 464*   BMET:  Recent Labs  01/29/17 1037 01/30/17 0637  NA 127* 128*  K 3.4* 3.4*  CL 89* 88*  CO2 26 25  GLUCOSE 113* 223*  BUN 80* 75*  CREATININE 7.32* 6.75*  CALCIUM 8.1* 8.3*   No results for input(s): PTH in the last 72 hours. Iron Studies: No results for input(s): IRON, TIBC, TRANSFERRIN, FERRITIN in the last 72 hours.  Studies/Results: No results found.  I have reviewed the patient's current medications.  Assessment/Plan: 1 ESRD PD going better, less, sx.  Mild edema.  Will lower bp meds.  Use same% 2 Pvd per surgery 3 Debill per Rehab 4 Anemia esa, 5 HPTH check in 2-3 d with labs 6 DM per primary P lower bp med, cont PD, rehab    LOS: 13 days   Kavonte Bearse,Holten L 01/30/2017,10:52 AM

## 2017-01-30 NOTE — Progress Notes (Signed)
Physical Therapy Note  Patient Details  Name: [name] KitchenJames D Strege MRN: [id number] Date of Birth: [birth year] Today's Date: 01/30/2017    Time: 1345-1425 40 minutes  1:1 No c/o pain, pt c/o fatigue from previous session.  [name] mobility with min A x 60' with bilat UEs.  Simulated car transfer to small SUV height with sliding board first attempt total A, second attempt max A with cues for UE and LE placement throughout.  PT discussed car options with pt and wife and both state the small SUV is their only option.  PT spoke with pt/wife to schedule car transfer to real car on Thursday.  Seated w/c push ups with focus on forward wt shift, LE placement and lifting up to progress to standing. Pt performed blocked practice 3 x 5 with mod A to clear bottom.  Pt performed sliding board transfer back to bed with max A.    Rivka Baune 01/30/2017, 2:25 PM

## 2017-01-30 NOTE — Progress Notes (Signed)
Occupational Therapy Session Note  Patient Details  Name: Joshua [name] MRN: [id number] Date of Birth: 1940/08/12  Today's Date: 01/30/2017 OT Individual Time: [id number] and [id number] OT Individual Time Calculation (min): 55 min and 45 min   Short Term Goals: Week 2:  OT Short Term Goal 1 (Week 2): Pt will transfer to drop arm commode with slide board mod assist OT Short Term Goal 2 (Week 2): Pt will perform sit to stand with mod A in prep for clothing management OT Short Term Goal 3 (Week 2): Pt will don pants in sitting with lateral leans to pull pants over hips post toileting with min assist  Skilled Therapeutic Interventions/Progress Updates:    1) Treatment session with focus on activity tolerance and increased independence with self-care tasks and transfers. Pt received supine in bed, pt and wife reports he already completed perineal hygiene and she assisted with donning underwear.  Pt able to thread pants with assist to don Lt pant leg, providing cues for hip adduction and internal rotation to increase positioning to thread pant leg.  Pulled pants over hips with rolling and bridging.  Min/tactile cues for weight shift to come into sitting position at EOB.  Completed squat pivot transfer mod assist to w/c with facilitation for anterior weight shift and lifting of buttocks.  Cues for anterior weight shift during UB dressing to pull shirt over hips.  Completed slide board transfer to Lt and squat pivot back to Rt with cues for anterior weight shift.  Pt propelled w/c 50 feet with frequent rest breaks and cues for straight trajectory.  Left upright in w/c with wife present.  2) Treatment session with focus on BUE strengthening and sit > stand.  Pt received upright in w/c with wife present.  Engaged in sit > stand x5 outside parallel bars with focus on anterior weight shift as needed for self-care tasks, transfers, and sit > stand.  Mod assist - +2 for sit > stand as pt fatigued.  Engaged in 2  sets of 10 overhead shoulder presses with 2# dumbbells from UE strengthening with cues for proper technique.  Pt passed off to PT.  Therapy Documentation Precautions:  Precautions Precautions: Fall Precaution Comments: PD port, VAC left residual limb Restrictions Weight Bearing Restrictions: Yes LLE Weight Bearing: Non weight bearing LLE Partial Weight Bearing Percentage or Pounds: Darco shoe Pain: Pt reports pain in Lt residual limb 4/10, RN notified  See Function Navigator for Current Functional Status.   Therapy/Group: Individual Therapy  Rosalio LoudHOXIE, Rodolfo Notaro 01/30/2017, 12:15 PM

## 2017-01-31 ENCOUNTER — Inpatient Hospital Stay (HOSPITAL_COMMUNITY): Payer: Medicare Other | Admitting: Physical Therapy

## 2017-01-31 ENCOUNTER — Inpatient Hospital Stay (HOSPITAL_COMMUNITY): Payer: Medicare Other | Admitting: Occupational Therapy

## 2017-01-31 LAB — CBC
HEMATOCRIT: 32.1 % — AB (ref 39.0–52.0)
Hemoglobin: 10.2 g/dL — ABNORMAL LOW (ref 13.0–17.0)
MCH: 30.8 pg (ref 26.0–34.0)
MCHC: 31.8 g/dL (ref 30.0–36.0)
MCV: 97 fL (ref 78.0–100.0)
PLATELETS: 462 10*3/uL — AB (ref 150–400)
RBC: 3.31 MIL/uL — ABNORMAL LOW (ref 4.22–5.81)
RDW: 16.3 % — AB (ref 11.5–15.5)
WBC: 9 10*3/uL (ref 4.0–10.5)

## 2017-01-31 LAB — GLUCOSE, CAPILLARY
GLUCOSE-CAPILLARY: 177 mg/dL — AB (ref 65–99)
Glucose-Capillary: 253 mg/dL — ABNORMAL HIGH (ref 65–99)
Glucose-Capillary: 224 mg/dL — ABNORMAL HIGH (ref 65–99)
Glucose-Capillary: 112 mg/dL — ABNORMAL HIGH (ref 65–99)

## 2017-01-31 MED ORDER — MIRTAZAPINE 15 MG PO TABS
30.0000 mg | ORAL_TABLET | Freq: Every day | ORAL | Status: DC
  Administered 2017-01-31 – 2017-02-06 (×7): 30 mg via ORAL
  Filled 2017-01-31 (×8): qty 2

## 2017-01-31 NOTE — Consult Note (Addendum)
WOC Nurse wound follow-up consult note Reason for Consult: Reassessment of nose.Pt has been wearing Bipap anddeveloped medical-device related pressure injuries which were present on admission to rehab. 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  Dressing procedure/placement/frequency: Discussed plan of care with patient and wife at the bedside. Gentamycin ointment has already been ordered to promote moist healing, can be covered by bandaidduring the day to promote moist healing to bridge of nose; they have ordered a gel cushion from their home health agency to wear at night to add padding to nose when patient is wearing Bipap mask at night. Instructions provided for staff nurses. Recommend plastic consult for further input since wound is on his face and will evolve into full thickness tissue loss, please order if desired; discussed with PA. Please re-consult if further assistance is needed.  Thank-you,  Cammie Mcgeeawn Riddhi Grether MSN, RN, CWOCN, LoyalWCN-AP, CNS [phone]

## 2017-01-31 NOTE — Progress Notes (Signed)
PD tx initiated via tenckhoff w/o problem, VSS, report given to Mason JimJoanna Kooy, RN

## 2017-01-31 NOTE — Progress Notes (Signed)
Subjective: Interval History: has complaints Tired after w/o.  Objective: Vital signs in last 24 hours: Temp:  [97.7 F (36.5 C)-99.2 F (37.3 C)] 97.7 F (36.5 C) (10/23 0840) Pulse Rate:  [73-101] 93 (10/23 0840) Resp:  [18-20] 20 (10/23 0840) BP: (111-152)/(51-64) 130/64 (10/23 0840) SpO2:  [94 %-99 %] 99 % (10/23 0505) Weight:  [69 kg (152 lb 1.9 oz)-73 kg (160 lb 15 oz)] 69 kg (152 lb 1.9 oz) (10/23 0505) Weight change: -16 kg (-35 lb 4.4 oz)  Intake/Output from previous day: 10/22 0701 - 10/23 0700 In: [id number] [P.O.:280] Out: 12498  Intake/Output this shift: Total I/O In: [id number] [Other:12495] Out: 13854 [Other:13854]  General appearance: alert, cooperative, moderately obese and pale Resp: diminished breath sounds bilaterally and rales bibasilar Cardio: S1, S2 normal and systolic murmur: systolic ejection 2/6, decrescendo at 2nd left intercostal space GI: pos bs, PD cath LUQ, soft Extremities: edema 1+ and L BK, R foot with boot  Lab Results:  Recent Labs  01/29/17 1037  WBC 10.2  HGB 8.9*  HCT 28.0*  PLT 464*   BMET:  Recent Labs  01/29/17 1037 01/30/17 0637  NA 127* 128*  K 3.4* 3.4*  CL 89* 88*  CO2 26 25  GLUCOSE 113* 223*  BUN 80* 75*  CREATININE 7.32* 6.75*  CALCIUM 8.1* 8.3*   No results for input(s): PTH in the last 72 hours. Iron Studies: No results for input(s): IRON, TIBC, TRANSFERRIN, FERRITIN in the last 72 hours.  Studies/Results: No results found.  I have reviewed the patient's current medications.  Assessment/Plan: 1 ESRD PD going well. Good UF and clearance 2 Anemia on esa, check Fe on Thurs 3 HPTH check 4 DM controlled 5 PVD per primary 6 Debill per rehab P PD , cont same regimen. Check labs on Thurs    LOS: 14 days   Vetra Shinall,Delonte L 01/31/2017,11:02 AM

## 2017-01-31 NOTE — Progress Notes (Signed)
Physical Therapy Note  Patient Details  Name: Joshua [name] MRN: [id number] Date of Birth: 1940-10-23 Today's Date: 01/31/2017    Time: 1300-1400 60 minutes  1:1 pt c/o neuropathic pain in bilat LEs, RN aware, meds given prior to session.  Transfer training with sliding board with min/mod A to surfaces 1-2 inches higher than w/c.  Standing in parallel bars x 3 up to 60 seconds with mod A to stand and for forward wt shift.  Seated ab crunches with 2# med ball 2 x 10.  Seated UE therex with 2# dowel x 10 shoulder press, chest press, circles CW/CCW, bicep curl, shoulder horiz abd/add. [name] mobility x 35' with min A with bilat UEs.    [name] 01/31/2017, 2:10 PM

## 2017-01-31 NOTE — Progress Notes (Signed)
Occupational Therapy Session Note  Patient Details  Name: [name] MRN: [id number] Date of Birth: Jan 27, 1941  Today's Date: 01/31/2017 OT Individual Time: 1003-1100 OT Individual Time Calculation (min): 57 min    Short Term Goals: Week 2:  OT Short Term Goal 1 (Week 2): Pt will transfer to drop arm commode with slide board mod assist OT Short Term Goal 2 (Week 2): Pt will perform sit to stand with mod A in prep for clothing management OT Short Term Goal 3 (Week 2): Pt will don pants in sitting with lateral leans to pull pants over hips post toileting with min assist  Skilled Therapeutic Interventions/Progress Updates:    Pt greeted semi-reclined in bed with spouse present. Pt states he completed LB bathing/dessing this morning with spouse and nursing. Pt agreeable to OT with encouragement. Pt came to sitting EOB with min guard A. Lateral transfer to wc with Max +2 and difficulty powering up to scoot. UB bathing/dressing completed at the sink with increased time and multiple rest breaks 2/2 poor activity tolerance. B UE strength/coordination with hair washing task. Pt's UE fatigued after ~ 45 seconds of hair washing. Pt donned shirt with assistance to pull down over trunk. Propelled pt to day room for therapeutic activities. Tried to engage pt in conversation about meaningful activities. Pt reports he used to enjoy building and painting model trains. Pt stated he could not do this activity anymore because he can't walk. Encouraged pt and tried explaining adaptations to activity so that he could continue to participate in something he enjoys. Pt became agitated with OT and not open to discussion or participation in meaningful activities. Pt completed 10 mins on UE arm bike with 4, 3 minute rest breaks. Continued w/ B UE there-ex to propel wc to room. Pt needed encouragement with poor attitude throughout session. Pt left seated in wc with B LEs elevated and needs met.  Therapy  Documentation Precautions:  Precautions Precautions: Fall Precaution Comments: PD port, VAC left residual limb Restrictions Weight Bearing Restrictions: Yes LLE Weight Bearing: Non weight bearing LLE Partial Weight Bearing Percentage or Pounds: Darco shoe Pain: Pain Assessment Pain Assessment: 0-10 Pain Score: 2  Pain Type: Chronic pain Pain Location: Leg Pain Orientation: Left Pain Descriptors / Indicators: Aching Pain Intervention(s): Repositioned  See Function Navigator for Current Functional Status.   Therapy/Group: Individual Therapy  Valma Cava 01/31/2017, 11:02 AM

## 2017-01-31 NOTE — Progress Notes (Signed)
[facility]     PROGRESS NOTE  Subjective/Complaints:  Pt seen laying in bed this AM.  He slept well overnight.  Per wife, he had a very poor appetite yesterday.  Reminded patient again, importance of nutrition.   ROS: Denies nausea, vomiting, diarrhea, shortness of breath or chest pain   Objective: Vital Signs: Blood pressure (!) 152/51, pulse 87, temperature 98.5 F (36.9 C), temperature source Oral, resp. rate 18, height 5\' 9"  (1.753 m), weight 69 kg (152 lb 1.9 oz), SpO2 99 %. No results found.  Recent Labs  01/29/17 1037  WBC 10.2  HGB 8.9*  HCT 28.0*  PLT 464*    Recent Labs  01/29/17 1037 01/30/17 0637  NA 127* 128*  K 3.4* 3.4*  CL 89* 88*  GLUCOSE 113* 223*  BUN 80* 75*  CREATININE 7.32* 6.75*  CALCIUM 8.1* 8.3*   CBG (last 3)   Recent Labs  01/30/17 1721 01/30/17 2131 01/31/17 0642  GLUCAP 75 189* 253*    Wt Readings from Last 3 Encounters:  01/31/17 69 kg (152 lb 1.9 oz)  01/17/17 86.7 kg (191 lb 2.2 oz)  01/05/17 87.9 kg (193 lb 12.6 oz)    Physical Exam:  BP (!) 152/51 (BP Location: Right Arm)   Pulse 87   Temp 98.5 F (36.9 C) (Oral)   Resp 18   Ht 5\' 9"  (1.753 m)   Wt 69 kg (152 lb 1.9 oz)   SpO2 99%   BMI 22.46 kg/m  Constitutional: He appears well-nourished. NAD. Obese. HENT: Normocephalic. Areas of abrasion healing.  Eyes: EOMI. No discharge.  Cardiovascular RRR. No JVD . Respiratory: Effort normal and breath sounds normal.  GI: Soft. Bowel sounds are normal.   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  Motor: 4+/5 grossly throughout (stable) Skin:  BKA incision with dressing c/d/i Psychiatric: His speech is normal. He is withdrawn.   Assessment/Plan: 1. Functional deficits secondary to left BKA which require 3+ hours per day of interdisciplinary therapy in a comprehensive inpatient rehab setting. Physiatrist is  providing close team supervision and 24 hour management of active medical problems listed below. Physiatrist and rehab team continue to assess barriers to discharge/monitor patient progress toward functional and medical goals.  Function:  Bathing Bathing position   Position: Wheelchair/chair at sink  Bathing parts Body parts bathed by patient: Right arm, Left arm, Chest, Abdomen (only completed UB bathing this session) Body parts bathed by helper: Back  Bathing assist Assist Level: Set up, Supervision or verbal cues   Set up : To obtain items  Upper Body Dressing/Undressing Upper body dressing   What is the patient wearing?: Pull over shirt/dress     Pull over shirt/dress - Perfomed by patient: Thread/unthread right sleeve, Thread/unthread left sleeve, Put head through opening, Pull shirt over trunk Pull over shirt/dress - Perfomed by helper: Pull shirt over trunk        Upper body assist Assist Level: Touching or steadying assistance(Pt > 75%)      Lower Body Dressing/Undressing Lower body dressing   What is the patient wearing?: Pants, Socks, Shoes Underwear - Performed by patient: Thread/unthread left underwear leg, Pull underwear up/down Underwear - Performed by helper: Thread/unthread right underwear leg Pants- Performed by patient: Thread/unthread right pants leg, Pull pants up/down Pants- Performed by helper: Thread/unthread left pants leg   Non-skid slipper socks- Performed by helper: Don/doff right sock   Socks -  Performed by helper: Don/doff right sock   Shoes - Performed by helper: Don/doff right shoe          Lower body assist Assist for lower body dressing:  (Max assist)      Toileting Toileting Toileting activity did not occur: Safety/medical concerns   Toileting steps completed by helper: Adjust clothing prior to toileting, Performs perineal hygiene, Adjust clothing after toileting Toileting Assistive Devices: Toilet aid (urinal/bedpan)  Toileting  assist Assist level: More than reasonable time, Set up/obtain supplies   Transfers Chair/bed transfer   Chair/bed transfer method: Lateral scoot Chair/bed transfer assist level: Moderate assist (Pt 50 - 74%/lift or lower) Chair/bed transfer assistive device: Armrests Mechanical lift: Stedy   Locomotion Ambulation Ambulation activity did not occur: Safety/medical concerns         Wheelchair   Type: Manual Max wheelchair distance: 50 ft Assist Level: Supervision or verbal cues  Cognition Comprehension Comprehension assist level: Follows basic conversation/direction with no assist  Expression Expression assist level: Expresses basic needs/ideas: With extra time/assistive device  Social Interaction Social Interaction assist level: Interacts appropriately 90% of the time - Needs monitoring or encouragement for participation or interaction.  Problem Solving Problem solving assist level: Solves basic 90% of the time/requires cueing < 10% of the time  Memory Memory assist level: Recognizes or recalls 75 - 89% of the time/requires cueing 10 - 24% of the time    Medical Problem List and Plan: 1. Functional deficitssecondary to left BKA  Cont CIR, 15/7  May weight bear through the heel on the right side 2. DVT Prophylaxis/Anticoagulation: Pharmaceutical: Heparin 3. Pain Management: Tylenol or Celebrex prn. Unable to tolerate muscle relaxers or pain medications due to SE of tremors, confusion and hallucinations.  4. Mood: LCSW to follow for evaluation and support.  5. Neuropsych: This patient is not fullycapable of making decisions on his own behalf. 6. Skin/Wound Care: Routine pressure relief measures. Prevalon boot for right foot. Monitor daily for intake to promote healing. Protein supplement and vitamins to promote wound healing.  7. Fluids/Electrolytes/Nutrition: Monitor I/O. Lytes/CBC per PD protocol.   Requires encouragement for nutrition  Remeron 15 mg started on 10/17,  increased on 10/23 8. Leucocytosis: Resolved   Monitor for fevers and other sign of infection.  Afebrile  WBCs 10.2 on 10/21  Cont to monitor 9. Hypotension/HTN: Monitor BP bid   Vitals:   01/30/17 2220 01/31/17 0505  BP: (!) 144/54 (!) 152/51  Pulse: 84 87  Resp:  18  Temp:  98.5 F (36.9 C)  SpO2:  99%   Midodrine d/ced 10/22  Elevated, cont to monitor 10. ESRD on PD: Schedule PD in evening and early am--unhook by breakfast.  11. Anemia of chronic disease: On aranesp every week.   Hb 8.9 on 10/21  Cont to monitor 12. T2DM insulin dependent: Brittle diabetic who's hard to control in part due to inconsistent intake. Changed to Premier protein with meals.   NPH decreased to to 15u qam   NPH 60u qpm CBG (last 3)   Recent Labs  01/30/17 1721 01/30/17 2131 01/31/17 0642  GLUCAP 75 189* 253*   Remains labile, monitor for trend 13. Constipation: Bowel reg increased on 10/22 14. PVD with s/p left femoral pseudoaneurysm repair.   Prevalon boot for RLE with pressure relief measures.  15. Delirium: ?Resolved  LOS (Days) 14 A FACE TO FACE EVALUATION WAS PERFORMED  Jakyren Fluegge Karis Jubanil Caliana Spires 01/31/2017 8:35 AM

## 2017-01-31 NOTE — Progress Notes (Signed)
Physical Therapy Session Note  Patient Details  Name: Joshua [name] MRN: [id number] Date of Birth: 1940/11/20  Today's Date: 01/31/2017 PT Individual Time: 1557-1620 PT Individual Time Calculation (min): 23 min   Short Term Goals: Week 3:     Skilled Therapeutic Interventions/Progress Updates:    no c/o pain.  Session focus on transfers with slide board and activity tolerance.  Pt reports being worn out at the end of the day.  Pt completes 3 slide board transfers on unlevel surfaces with mod to ascend and min to descend.  Max multimodal cues for forward weight shift and head/hips relationship.  Pt requires extended rest breaks between transfers due to fatigue.  Left in bed at end of session and positioned to comfort, RN present to administer pain meds.   Therapy Documentation Precautions:  Precautions Precautions: Fall Precaution Comments: PD port, VAC left residual limb Restrictions Weight Bearing Restrictions: Yes LLE Weight Bearing: Non weight bearing LLE Partial Weight Bearing Percentage or Pounds: Darco shoe   See Function Navigator for Current Functional Status.   Therapy/Group: Individual Therapy  Stephania FragminCaitlin E Fallan Mccarey 01/31/2017, 4:33 PM

## 2017-02-01 ENCOUNTER — Inpatient Hospital Stay (HOSPITAL_COMMUNITY): Payer: Medicare Other | Admitting: Occupational Therapy

## 2017-02-01 ENCOUNTER — Inpatient Hospital Stay (HOSPITAL_COMMUNITY): Payer: Medicare Other

## 2017-02-01 DIAGNOSIS — L89813 Pressure ulcer of head, stage 3: Secondary | ICD-10-CM

## 2017-02-01 DIAGNOSIS — Z89421 Acquired absence of other right toe(s): Secondary | ICD-10-CM

## 2017-02-01 LAB — GLUCOSE, CAPILLARY
GLUCOSE-CAPILLARY: 241 mg/dL — AB (ref 65–99)
Glucose-Capillary: 227 mg/dL — ABNORMAL HIGH (ref 65–99)
Glucose-Capillary: 141 mg/dL — ABNORMAL HIGH (ref 65–99)
Glucose-Capillary: 241 mg/dL — ABNORMAL HIGH (ref 65–99)

## 2017-02-01 MED ORDER — SENNOSIDES-DOCUSATE SODIUM 8.6-50 MG PO TABS
2.0000 | ORAL_TABLET | Freq: Two times a day (BID) | ORAL | Status: DC
  Administered 2017-02-01 – 2017-02-07 (×11): 2 via ORAL
  Filled 2017-02-01 (×15): qty 2

## 2017-02-01 MED ORDER — SENNOSIDES-DOCUSATE SODIUM 8.6-50 MG PO TABS: 2.0000 | ORAL_TABLET | Freq: Two times a day (BID) | ORAL | Status: DC

## 2017-02-01 NOTE — Progress Notes (Signed)
Occupational Therapy Session Note  Patient Details  Name: Joshua KitchenJames D Kerper MRN: [id number] Date of Birth: 01-20-1941  Today's Date: 02/01/2017 OT Individual Time: 1136-1200 OT Individual Time Calculation (min): 24 min    Short Term Goals: Week 2:  OT Short Term Goal 1 (Week 2): Pt will transfer to drop arm commode with slide board mod assist OT Short Term Goal 2 (Week 2): Pt will perform sit to stand with mod A in prep for clothing management OT Short Term Goal 3 (Week 2): Pt will don pants in sitting with lateral leans to pull pants over hips post toileting with min assist  Skilled Therapeutic Interventions/Progress Updates:    Treatment session with focus on functional transfers and d/c planning. Pt received in w/c having just completed PT session, reporting pain and fatigue.  Discussed transfers and need for additional transfer training with pt and wife, pt reluctant to engage in transfers at this time due to fatigue however willing to transfer to recliner.  Pt completed squat pivot transfer w/c > recliner with max assist secondary to fatigue, requiring increased assistance for lifting and positioning due to pt sliding forward.  Educated on increased head/hips relationship and weight shifting as needed for all transfers.  Engaged in lateral leans as needed for weight shifting and LB dressing/toileting with tactile cues to facilitate weight shift.  Therapy Documentation Precautions:  Precautions Precautions: Fall Precaution Comments: PD port, VAC left residual limb Restrictions Weight Bearing Restrictions: Yes LLE Weight Bearing: Non weight bearing LLE Partial Weight Bearing Percentage or Pounds: Darco shoe Pain:  Pt with c/o pain 6/10 in Lt residual limb.  RN aware  See Function Navigator for Current Functional Status.   Therapy/Group: Individual Therapy  Rosalio LoudHOXIE, Shayan Bramhall 02/01/2017, 3:46 PM

## 2017-02-01 NOTE — Progress Notes (Signed)
Occupational Therapy Session Note  Patient Details  Name: Joshua [name] MRN: [id number] Date of Birth: 03/18/1941  Today's Date: 02/01/2017 OT Individual Time: 1400-1500 OT Individual Time Calculation (min): 60 min    Short Term Goals: Week 2:  OT Short Term Goal 1 (Week 2): Pt will transfer to drop arm commode with slide board mod assist OT Short Term Goal 2 (Week 2): Pt will perform sit to stand with mod A in prep for clothing management OT Short Term Goal 3 (Week 2): Pt will don pants in sitting with lateral leans to pull pants over hips post toileting with min assist Week 3:    Week 4:     Skilled Therapeutic Interventions/Progress Updates:    1:1 Family hands on practice with wife on functional transfers; including slide board transfers and squat pivot transfers.  Performed transfers recliner<>w/c, w/c <>mat; in all ~7 transfers with wife preforming. Wife better to assist standing posterior to pt for better positioning. Pt and wife working on communication during transfers and wife's ability to properly cue pt for optimal body positioning.  Transfers were overall mod to max A with both methods.    Therapy Documentation Precautions:  Precautions Precautions: Fall Precaution Comments: PD port, VAC left residual limb Restrictions Weight Bearing Restrictions: Yes LLE Weight Bearing: Non weight bearing LLE Partial Weight Bearing Percentage or Pounds: Darco shoe Pain: soreness in right foot and in left residual limb. RN notifed at end of session for pain meds- not rated.  Propped LEs in recliner at end of session with rest.  See Function Navigator for Current Functional Status.   Therapy/Group: Individual Therapy  Roney MansSmith, [name] 02/01/2017, 4:21 PM

## 2017-02-01 NOTE — Progress Notes (Signed)
Subjective: Interval History: has no complaint , doing ok.  Objective: Vital signs in last 24 hours: Temp:  [98.9 F (37.2 C)-99.6 F (37.6 C)] 99 F (37.2 C) (10/24 0505) Pulse Rate:  [83-93] 93 (10/24 0505) Resp:  [18-20] 18 (10/24 0505) BP: (119-171)/(53-57) 171/56 (10/24 0505) SpO2:  [97 %-99 %] 99 % (10/24 0505) Weight:  [70 kg (154 lb 5.2 oz)-76 kg (167 lb 8.8 oz)] 70 kg (154 lb 5.2 oz) (10/24 0505) Weight change: 18 kg (39 lb 10.9 oz)  Intake/Output from previous day: 10/23 0701 - 10/24 0700 In: [id number] [P.O.:160] Out: 13854  Intake/Output this shift: No intake/output data recorded.  General appearance: alert, cooperative, moderately obese and pale Resp: diminished breath sounds bibasilar Cardio: S1, S2 normal and systolic murmur: systolic ejection 2/6, crescendo at 2nd left intercostal space GI: pos bs, pos FW, PD cath LUQ Extremities: 1+ edema, L BKA, boot on R  Lab Results:  Recent Labs  01/29/17 1037 01/31/17 1440  WBC 10.2 9.0  HGB 8.9* 10.2*  HCT 28.0* 32.1*  PLT 464* 462*   BMET:  Recent Labs  01/29/17 1037 01/30/17 0637  NA 127* 128*  K 3.4* 3.4*  CL 89* 88*  CO2 26 25  GLUCOSE 113* 223*  BUN 80* 75*  CREATININE 7.32* 6.75*  CALCIUM 8.1* 8.3*   No results for input(s): PTH in the last 72 hours. Iron Studies: No results for input(s): IRON, TIBC, TRANSFERRIN, FERRITIN in the last 72 hours.  Studies/Results: No results found.  I have reviewed the patient's current medications.  Assessment/Plan: 1 ESRD PD going well. Will go back to all 2.5% tonight and mild vol xs, SNa lower 2 HTN lower vol, cont meds 3 Anemia esa, check Fe 4HPTH check 5 Obesity 6 PVD 7 Debill P 2.5%, check labs in am,      LOS: 15 days   Alanee Ting,Makarios L 02/01/2017,10:10 AM

## 2017-02-01 NOTE — Progress Notes (Addendum)
  Progress Note    02/01/2017 9:51 AM 12 Days Post-Op  Subjective:  No complaints; says rehab is going well    Vitals:   01/31/17 1932 02/01/17 0505  BP: (!) 124/53 (!) 171/56  Pulse: 83 93  Resp: 20 18  Temp: 98.9 F (37.2 C) 99 F (37.2 C)  SpO2: 97% 99%    Physical Exam: Incisions:         CBC    Component Value Date/Time   WBC 9.0 01/31/2017 1440   RBC 3.31 (L) 01/31/2017 1440   HGB 10.2 (L) 01/31/2017 1440   HCT 32.1 (L) 01/31/2017 1440   PLT 462 (H) 01/31/2017 1440   MCV 97.0 01/31/2017 1440   MCH 30.8 01/31/2017 1440   MCHC 31.8 01/31/2017 1440   RDW 16.3 (H) 01/31/2017 1440   LYMPHSABS 1.1 01/27/2017 0537   MONOABS 0.7 01/27/2017 0537   EOSABS 0.2 01/27/2017 0537   BASOSABS 0.0 01/27/2017 0537    BMET    Component Value Date/Time   NA 128 (L) 01/30/2017 0637   K 3.4 (L) 01/30/2017 0637   CL 88 (L) 01/30/2017 0637   CO2 25 01/30/2017 0637   GLUCOSE 223 (H) 01/30/2017 0637   BUN 75 (H) 01/30/2017 0637   CREATININE 6.75 (H) 01/30/2017 0637   CALCIUM 8.3 (L) 01/30/2017 0637   GFRNONAA 7 (L) 01/30/2017 0637   GFRAA 8 (L) 01/30/2017 0637    INR    Component Value Date/Time   INR 1.20 01/09/2017 1424     Intake/Output Summary (Last 24 hours) at 02/01/17 0951 Last data filed at 01/31/17 1230  Gross per 24 hour  Intake              100 ml  Output                0 ml  Net              100 ml     Assessment:  76 y.o. 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  (see pic above) Right groin incision is healing but has one area (see pic above) on the distal portion of the incision that is not healing.  This does not appear to be infected.  Continue dry dressings for now.  Right groin is softer than the last time I examined him. -says anticipated discharge  date is Tuesday.    Doreatha MassedSamantha Rhyne, PA-C [facility] [phone] 02/01/2017 9:51 AM  I agree with the above.  I have seen and evaluated the patient.  So far, his right toe amputation site appears to be healing.  I will have him scheduled for follow-up in our office in approximately 2 weeks for suture removal.  There is a slight separation of the inferior aspect of his groin incision.  For now, I would recommend dry dressings to be changed daily.  Durene CalWells Brabham

## 2017-02-01 NOTE — Progress Notes (Signed)
[facility] PHYSICAL MEDICINE & REHABILITATION     PROGRESS NOTE  Subjective/Complaints:  Pt seen laying in bed this AM.  Her slept well overnight.  Wife reports poor appetite yesterday.  She also requests evaluation of right groin. She also complains of constipation.   ROS: Denies nausea, vomiting, diarrhea, shortness of breath or chest pain   Objective: Vital Signs: Blood pressure (!) 171/56, pulse 93, temperature 99 F (37.2 C), temperature source Oral, resp. rate 18, height 5\' 9"  (1.753 m), weight 70 kg (154 lb 5.2 oz), SpO2 99 %. No results found.  Recent Labs  01/29/17 1037 01/31/17 1440  WBC 10.2 9.0  HGB 8.9* 10.2*  HCT 28.0* 32.1*  PLT 464* 462*    Recent Labs  01/29/17 1037 01/30/17 0637  NA 127* 128*  K 3.4* 3.4*  CL 89* 88*  GLUCOSE 113* 223*  BUN 80* 75*  CREATININE 7.32* 6.75*  CALCIUM 8.1* 8.3*   CBG (last 3)   Recent Labs  01/31/17 1731 01/31/17 2118 02/01/17 0642  GLUCAP 112* 177* 241*    Wt Readings from Last 3 Encounters:  02/01/17 70 kg (154 lb 5.2 oz)  01/17/17 86.7 kg (191 lb 2.2 oz)  01/05/17 87.9 kg (193 lb 12.6 oz)    Physical Exam:  BP (!) 171/56 (BP Location: Right Arm)   Pulse 93   Temp 99 F (37.2 C) (Oral)   Resp 18   Ht 5\' 9"  (1.753 m)   Wt 70 kg (154 lb 5.2 oz)   SpO2 99%   BMI 22.79 kg/m  Constitutional: He appears well-nourished. NAD. Obese. HENT: Normocephalic. Areas of abrasion healing, except nose.  Eyes: EOMI. No discharge.  Cardiovascular RRR. No JVD . Respiratory: Effort normal and breath sounds normal.  GI: Soft. Bowel sounds are normal.   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  Motor: 4+/5 grossly throughout (stable) Skin:  BKA incision with dressing c/d/i.  Right 5th dig amp with serosanguinous drainage Right groin with ulcer and induration Psychiatric: His speech is normal. He is withdrawn.   Assessment/Plan: 1. Functional  deficits secondary to left BKA which require 3+ hours per day of interdisciplinary therapy in a comprehensive inpatient rehab setting. Physiatrist is providing close team supervision and 24 hour management of active medical problems listed below. Physiatrist and rehab team continue to assess barriers to discharge/monitor patient progress toward functional and medical goals.  Function:  Bathing Bathing position   Position: Wheelchair/chair at sink  Bathing parts Body parts bathed by patient: Right arm, Left arm, Chest, Abdomen (only completed UB bathing this session) Body parts bathed by helper: Back  Bathing assist Assist Level: Set up, Supervision or verbal cues   Set up : To obtain items  Upper Body Dressing/Undressing Upper body dressing   What is the patient wearing?: Pull over shirt/dress     Pull over shirt/dress - Perfomed by patient: Thread/unthread right sleeve, Thread/unthread left sleeve, Put head through opening, Pull shirt over trunk Pull over shirt/dress - Perfomed by helper: Pull shirt over trunk        Upper body assist Assist Level: Touching or steadying assistance(Pt > 75%)      Lower Body Dressing/Undressing Lower body dressing   What is the patient wearing?: Pants, Socks, Shoes Underwear - Performed by patient: Thread/unthread left underwear leg, Pull underwear up/down Underwear - Performed by helper: Thread/unthread right underwear leg Pants- Performed by patient: Thread/unthread right pants leg, Pull pants  up/down Pants- Performed by helper: Thread/unthread left pants leg   Non-skid slipper socks- Performed by helper: Don/doff right sock   Socks - Performed by helper: Don/doff right sock   Shoes - Performed by helper: Don/doff right shoe          Lower body assist Assist for lower body dressing:  (Max assist)      Toileting Toileting Toileting activity did not occur: Safety/medical concerns   Toileting steps completed by helper: Adjust clothing  prior to toileting, Performs perineal hygiene, Adjust clothing after toileting Toileting Assistive Devices: Toilet aid (urinal/bedpan)  Toileting assist Assist level: More than reasonable time, Set up/obtain supplies   Transfers Chair/bed transfer   Chair/bed transfer method: Lateral scoot Chair/bed transfer assist level: Moderate assist (Pt 50 - 74%/lift or lower) Chair/bed transfer assistive device: Armrests Mechanical lift: Stedy   Locomotion Ambulation Ambulation activity did not occur: Safety/medical concerns         Wheelchair   Type: Manual Max wheelchair distance: 50 ft Assist Level: Supervision or verbal cues  Cognition Comprehension Comprehension assist level: Follows basic conversation/direction with no assist  Expression Expression assist level: Expresses basic needs/ideas: With extra time/assistive device  Social Interaction Social Interaction assist level: Interacts appropriately with others with medication or extra time (anti-anxiety, antidepressant).  Problem Solving Problem solving assist level: Solves complex 90% of the time/cues < 10% of the time  Memory Memory assist level: Recognizes or recalls 75 - 89% of the time/requires cueing 10 - 24% of the time    Medical Problem List and Plan: 1. Functional deficitssecondary to left BKA  Cont CIR, 15/7  May weight bear through the heel on the right side 2. DVT Prophylaxis/Anticoagulation: Pharmaceutical: Heparin 3. Pain Management: Tylenol or Celebrex prn. Unable to tolerate muscle relaxers or pain medications due to SE of tremors, confusion and hallucinations.  4. Mood: LCSW to follow for evaluation and support.  5. Neuropsych: This patient is not fullycapable of making decisions on his own behalf. 6. Skin/Wound Care: Routine pressure relief measures. Prevalon boot for right foot. Monitor daily for intake to promote healing. Protein supplement and vitamins to promote wound healing.   Will speak to Vasc regarding  all wound  Will speak with PRS on Friday regarding ulcer on nose 7. Fluids/Electrolytes/Nutrition: Monitor I/O. Lytes/CBC per PD protocol.   Requires encouragement for nutrition  Remeron 15 mg started on 10/17, increased on 10/23 8. Leucocytosis: Resolved   Monitor for fevers and other sign of infection.  Afebrile  Cont to monitor 9. Hypotension/HTN: Monitor BP bid   Vitals:   01/31/17 1932 02/01/17 0505  BP: (!) 124/53 (!) 171/56  Pulse: 83 93  Resp: 20 18  Temp: 98.9 F (37.2 C) 99 F (37.2 C)  SpO2: 97% 99%   Midodrine d/ced 10/22  Labile, cont to monitor for trend 10. ESRD on PD: Schedule PD in evening and early am--unhook by breakfast.  11. Anemia of chronic disease: On aranesp every week.   Hb 10.2 on 10/23  Cont to monitor 12. T2DM insulin dependent: Brittle diabetic who's hard to control in part due to inconsistent intake. Changed to Premier protein with meals.   NPH decreased to to 15u qam   NPH 60u qpm CBG (last 3)   Recent Labs  01/31/17 1731 01/31/17 2118 02/01/17 0642  GLUCAP 112* 177* 241*   Remains labile, encouraged consistent intake 13. Constipation: Bowel reg increased on 10/22, again on 10/24 14. PVD with s/p left femoral pseudoaneurysm repair.  Prevalon boot for RLE with pressure relief measures.  15. Delirium: ?Resolved  LOS (Days) 15 A FACE TO FACE EVALUATION WAS PERFORMED  Ankit Karis Juba 02/01/2017 8:30 AM

## 2017-02-01 NOTE — Plan of Care (Signed)
Problem: RH SKIN INTEGRITY Goal: RH STG SKIN FREE OF INFECTION/BREAKDOWN Patients skin will remain free from further breakdown or infection with mod assist.  Outcome: Not Progressing Pt has wounds to bridge of nose, right groin, right 5th toe removal and left BKA

## 2017-02-01 NOTE — Progress Notes (Signed)
Physical Therapy Note  Patient Details  Name: Joshua KitchenJames D Hanawalt MRN: [id number] Date of Birth: 1940/06/20 Today's Date: 02/01/2017  1030-1135, 65 min individual tx Pain: 6/10 L medial knee, premedicated Wt bearing  As tolerated RLe through heel  Family ed with wife for w/c set up for transfers, placement of SB, head/hips relationship.  Bed mobility with HOB raised and L rail with min assist.  Slide board transfer to R with max assist.  [name] propulsion on level tile with extensive extra time, supervision.  Instructed pt and wife in stroke length, techniques for turns to improve efficiency in w/c.  Pt stated his hands slip on rims.  [name] gloves issued, and rims wrapped with Theraband for increased friction. Therapeutic exercise performed with LE to increase strength for functional mobility: kinetron in sitting from w/cusing RLE with resistance from PT x 10 x 3; R long arc quad knee ext, L quad sets, L short arc quad knee ext , and trunk flexion 2 x 10 each. Instructed pt in glut sets several times, with multimodal cues, but pt perseverated on R long arc quad knee ext, and was unable to elicit glut activation. See function navigator for current status.   Leyna Vanderkolk 02/01/2017, 7:59 AM

## 2017-02-02 ENCOUNTER — Inpatient Hospital Stay (HOSPITAL_COMMUNITY): Payer: Medicare Other | Admitting: Occupational Therapy

## 2017-02-02 ENCOUNTER — Inpatient Hospital Stay (HOSPITAL_COMMUNITY): Payer: Medicare Other | Admitting: Physical Therapy

## 2017-02-02 LAB — CBC
HCT: 31.1 % — ABNORMAL LOW (ref 39.0–52.0)
Hemoglobin: 9.7 g/dL — ABNORMAL LOW (ref 13.0–17.0)
MCH: 30.3 pg (ref 26.0–34.0)
MCHC: 31.2 g/dL (ref 30.0–36.0)
MCV: 97.2 fL (ref 78.0–100.0)
PLATELETS: 445 10*3/uL — AB (ref 150–400)
RBC: 3.2 MIL/uL — AB (ref 4.22–5.81)
RDW: 16.5 % — ABNORMAL HIGH (ref 11.5–15.5)
WBC: 6.9 10*3/uL (ref 4.0–10.5)

## 2017-02-02 LAB — GLUCOSE, CAPILLARY
GLUCOSE-CAPILLARY: 66 mg/dL (ref 65–99)
Glucose-Capillary: 203 mg/dL — ABNORMAL HIGH (ref 65–99)
Glucose-Capillary: 144 mg/dL — ABNORMAL HIGH (ref 65–99)
Glucose-Capillary: 51 mg/dL — ABNORMAL LOW (ref 65–99)
Glucose-Capillary: 157 mg/dL — ABNORMAL HIGH (ref 65–99)

## 2017-02-02 LAB — COMPREHENSIVE METABOLIC PANEL
ALK PHOS: 66 U/L (ref 38–126)
ALT: 21 U/L (ref 17–63)
AST: 24 U/L (ref 15–41)
Albumin: 1.7 g/dL — ABNORMAL LOW (ref 3.5–5.0)
Anion gap: 12 (ref 5–15)
BUN: 69 mg/dL — ABNORMAL HIGH (ref 6–20)
CALCIUM: 8.4 mg/dL — AB (ref 8.9–10.3)
CO2: 28 mmol/L (ref 22–32)
CREATININE: 6.25 mg/dL — AB (ref 0.61–1.24)
Chloride: 92 mmol/L — ABNORMAL LOW (ref 101–111)
GFR, EST AFRICAN AMERICAN: 9 mL/min — AB (ref >60)
GFR, EST NON AFRICAN AMERICAN: 8 mL/min — AB (ref >60)
Glucose, Bld: 187 mg/dL — ABNORMAL HIGH (ref 65–99)
Potassium: 3.1 mmol/L — ABNORMAL LOW (ref 3.5–5.1)
Sodium: 132 mmol/L — ABNORMAL LOW (ref 135–145)
Total Bilirubin: 0.4 mg/dL (ref 0.3–1.2)
Total Protein: 5.7 g/dL — ABNORMAL LOW (ref 6.5–8.1)

## 2017-02-02 LAB — IRON AND TIBC
IRON: 28 ug/dL — AB (ref 45–182)
SATURATION RATIOS: 18 % (ref 17.9–39.5)
TIBC: 158 ug/dL — ABNORMAL LOW (ref 250–450)
UIBC: 130 ug/dL

## 2017-02-02 LAB — PHOSPHORUS: PHOSPHORUS: 3.9 mg/dL (ref 2.5–4.6)

## 2017-02-02 MED ORDER — POTASSIUM CHLORIDE CRYS ER 20 MEQ PO TBCR
40.0000 meq | EXTENDED_RELEASE_TABLET | Freq: Once | ORAL | Status: AC
  Administered 2017-02-02: 40 meq via ORAL
  Filled 2017-02-02: qty 2

## 2017-02-02 MED ORDER — HYDROCODONE-ACETAMINOPHEN 5-325 MG PO TABS
ORAL_TABLET | ORAL | Status: AC
  Filled 2017-02-02: qty 1

## 2017-02-02 NOTE — Progress Notes (Signed)
[facility] PHYSICAL MEDICINE & REHABILITATION     PROGRESS NOTE  Subjective/Complaints:  Patient seen lying in bed this morning. He states he slept well overnight. Wife states that vascular recommended monitoring or complaints. Patient notes pain in his left heel. Has questions regarding it.  ROS: Denies nausea, vomiting, diarrhea, shortness of breath or chest pain   Objective: Vital Signs: Blood pressure (!) 152/59, pulse 88, temperature 97.8 F (36.6 C), temperature source Oral, resp. rate 17, height 5\' 9"  (1.753 m), weight 78 kg (171 lb 15.3 oz), SpO2 99 %. No results found.  Recent Labs  01/31/17 1440 02/02/17 0705  WBC 9.0 6.9  HGB 10.2* 9.7*  HCT 32.1* 31.1*  PLT 462* 445*    Recent Labs  02/02/17 0705  NA 132*  K 3.1*  CL 92*  GLUCOSE 187*  BUN 69*  CREATININE 6.25*  CALCIUM 8.4*   CBG (last 3)   Recent Labs  02/01/17 2053 02/02/17 0641 02/02/17 1205  GLUCAP 241* 203* 144*    Wt Readings from Last 3 Encounters:  02/02/17 78 kg (171 lb 15.3 oz)  01/17/17 86.7 kg (191 lb 2.2 oz)  01/05/17 87.9 kg (193 lb 12.6 oz)    Physical Exam:  BP (!) 152/59 (BP Location: Right Arm)   Pulse 88   Temp 97.8 F (36.6 C) (Oral)   Resp 17   Ht 5\' 9"  (1.753 m)   Wt 78 kg (171 lb 15.3 oz)   SpO2 99%   BMI 25.39 kg/m  Constitutional: He appears well-nourished. NAD. Obese. HENT: Normocephalic. Areas of abrasion healing, except nose.  Eyes: EOMI. No discharge.  Cardiovascular RRR. No JVD . Respiratory: Effort Normal and breath sounds normal.  GI: Soft. Bowel sounds are normal.   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  Motor: 4+/5 grossly throughout (unchanged) Skin:  BKA incision with dressing c/d/i.  Right 5th dig amp with dressing Right groin with ulcer and induration, not examined today Psychiatric: His speech is normal. He is withdrawn.   Assessment/Plan: 1. Functional deficits  secondary to left BKA which require 3+ hours per day of interdisciplinary therapy in a comprehensive inpatient rehab setting. Physiatrist is providing close team supervision and 24 hour management of active medical problems listed below. Physiatrist and rehab team continue to assess barriers to discharge/monitor patient progress toward functional and medical goals.  Function:  Bathing Bathing position   Position: Wheelchair/chair at sink  Bathing parts Body parts bathed by patient: Right arm, Left arm, Chest, Abdomen (only completed UB bathing this session) Body parts bathed by helper: Back  Bathing assist Assist Level: Set up, Supervision or verbal cues   Set up : To obtain items  Upper Body Dressing/Undressing Upper body dressing   What is the patient wearing?: Pull over shirt/dress     Pull over shirt/dress - Perfomed by patient: Thread/unthread right sleeve, Thread/unthread left sleeve, Put head through opening, Pull shirt over trunk Pull over shirt/dress - Perfomed by helper: Pull shirt over trunk        Upper body assist Assist Level: Touching or steadying assistance(Pt > 75%)      Lower Body Dressing/Undressing Lower body dressing   What is the patient wearing?: Pants, Socks, Shoes Underwear - Performed by patient: Thread/unthread left underwear leg, Pull underwear up/down Underwear - Performed by helper: Thread/unthread right underwear leg Pants- Performed by patient: Thread/unthread right pants leg, Pull pants up/down Pants- Performed by helper: Thread/unthread left  pants leg   Non-skid slipper socks- Performed by helper: Don/doff right sock   Socks - Performed by helper: Don/doff right sock   Shoes - Performed by helper: Don/doff right shoe          Lower body assist Assist for lower body dressing:  (Max assist)      Toileting Toileting Toileting activity did not occur: No continent bowel/bladder event Toileting steps completed by patient: Adjust clothing  prior to toileting, Performs perineal hygiene Toileting steps completed by helper: Adjust clothing prior to toileting, Performs perineal hygiene, Adjust clothing after toileting Toileting Assistive Devices: Toilet aid (urinal/bedpan)  Toileting assist Assist level: Two helpers   Transfers Chair/bed transfer   Chair/bed transfer method: Lateral scoot Chair/bed transfer assist level: Maximal assist (Pt 25 - 49%/lift and lower) Chair/bed transfer assistive device: Armrests, Sliding board Mechanical lift: Stedy   Locomotion Ambulation Ambulation activity did not occur: Safety/medical concerns         Wheelchair   Type: Manual Max wheelchair distance: 50 Assist Level: Supervision or verbal cues  Cognition Comprehension Comprehension assist level: Follows basic conversation/direction with no assist  Expression Expression assist level: Expresses basic needs/ideas: With extra time/assistive device  Social Interaction Social Interaction assist level: Interacts appropriately 90% of the time - Needs monitoring or encouragement for participation or interaction.  Problem Solving Problem solving assist level: Solves basic 90% of the time/requires cueing < 10% of the time  Memory Memory assist level: Recognizes or recalls 50 - 74% of the time/requires cueing 25 - 49% of the time    Medical Problem List and Plan: 1. Functional deficitssecondary to left BKA  Cont CIR, 15/7  May weight bear through the heel on the right side 2. DVT Prophylaxis/Anticoagulation: Pharmaceutical: Heparin 3. Pain Management: Tylenol or Celebrex prn. Unable to tolerate muscle relaxers or pain medications due to SE of tremors, confusion and hallucinations.  4. Mood: LCSW to follow for evaluation and support.  5. Neuropsych: This patient is not fullycapable of making decisions on his own behalf. 6. Skin/Wound Care: Routine pressure relief measures. Prevalon boot for right foot. Monitor daily for intake to promote  healing. Protein supplement and vitamins to promote wound healing.   Per vascular, cont to monitor wounds  Will speak with PRS on Friday regarding ulcer on nose 7. Fluids/Electrolytes/Nutrition: Monitor I/O. Lytes/CBC per PD protocol.   Requires encouragement for nutrition  Remeron 15 mg started on 10/17, increased on 10/23 8. Leucocytosis: Resolved   Monitor for fevers and other sign of infection.  Afebrile  Cont to monitor 9. Hypotension/HTN: Monitor BP bid   Vitals:   02/02/17 0530 02/02/17 0800  BP: (!) 152/59 (!) 152/59  Pulse: 86 88  Resp: 18 17  Temp: 97.6 F (36.4 C) 97.8 F (36.6 C)  SpO2: 99% 99%   Midodrine d/ced 10/22  Remains labile, but relatively controlled 10. ESRD on PD: Schedule PD in evening and early am--unhook by breakfast.  11. Anemia of chronic disease: On aranesp every week.   Hb 9.7 on 10/25  Cont to monitor 12. T2DM insulin dependent: Brittle diabetic who's hard to control in part due to inconsistent intake. Changed to Premier protein with meals.   NPH decreased to to 15u qam   NPH 60u qpm CBG (last 3)   Recent Labs  02/01/17 2053 02/02/17 0641 02/02/17 1205  GLUCAP 241* 203* 144*   Remains labile, But appears to be stabilizing 13. Constipation: Bowel reg increased on 10/22, again on 10/24 14. PVD  with s/p left femoral pseudoaneurysm repair.   Prevalon boot for RLE with pressure relief measures.  15. Delirium: ?Resolved  LOS (Days) 16 A FACE TO FACE EVALUATION WAS PERFORMED  Ankit Karis Jubanil Patel 02/02/2017 12:58 PM

## 2017-02-02 NOTE — Progress Notes (Signed)
Subjective: Interval History: has no complaint ,good catheter function.  Objective: Vital signs in last 24 hours: Temp:  [97.6 F (36.4 C)-98.7 F (37.1 C)] 97.6 F (36.4 C) (10/25 0530) Pulse Rate:  [86-89] 86 (10/25 0530) Resp:  [18] 18 (10/25 0530) BP: (129-152)/(59-61) 152/59 (10/25 0530) SpO2:  [99 %] 99 % (10/25 0530) Weight:  [70 kg (154 lb 5.2 oz)-76 kg (167 lb 8.8 oz)] 70 kg (154 lb 5.2 oz) (10/25 0530) Weight change: 0 kg (0 lb)  Intake/Output from previous day: 10/24 0701 - 10/25 0700 In: [id number] [P.O.:360] Out: [id number]  Intake/Output this shift: No intake/output data recorded.  General appearance: alert, cooperative, no distress, morbidly obese and pale Resp: diminished breath sounds bilaterally Cardio: S1, S2 normal and systolic murmur: systolic ejection 2/6, decrescendo at 2nd left intercostal space GI: pos bs, pos FW, PD cath LUQ Extremities: L BKA , R boot,  Lab Results:  Recent Labs  01/31/17 1440 02/02/17 0705  WBC 9.0 6.9  HGB 10.2* 9.7*  HCT 32.1* 31.1*  PLT 462* 445*   BMET:  Recent Labs  02/02/17 0705  NA 132*  K 3.1*  CL 92*  CO2 28  GLUCOSE 187*  BUN 69*  CREATININE 6.25*  CALCIUM 8.4*   No results for input(s): PTH in the last 72 hours. Iron Studies: No results for input(s): IRON, TIBC, TRANSFERRIN, FERRITIN in the last 72 hours.  Studies/Results: No results found.  I have reviewed the patient's current medications.  Assessment/Plan: 1 ESRD PD going well. Cont 2.5 to lower vol slowly 2 HTN lower vol and bp 3 anemia esa, Fe pending 4 HPTH pending 5 DM controlle 6 PVD 7 Debill still low alb P PD, 2.5, esa, F/U labs    LOS: 16 days   Karlei Waldo,Barron L 02/02/2017,10:10 AM

## 2017-02-02 NOTE — Patient Care Conference (Signed)
Inpatient RehabilitationTeam Conference and Plan of Care Update Date: 02/01/2017   Time: 2:30 PM    Patient Name: [name]      Medical Record Number: [id number]  Date of Birth: 12/18/1940 Sex: Male         Room/Bed: 4W05C/4W05C-01 Payor Info: Payor: MEDICARE / Plan: MEDICARE PART A AND B / Product Type: *No Product type* /    Admitting Diagnosis: L BKA nonviable tissue  Admit Date/Time:  01/17/2017  5:16 PM Admission Comments: No comment available   Primary Diagnosis:  Unilateral complete BKA, right, sequela (HCC) Principal Problem: Unilateral complete BKA, right, sequela [facility])  Patient Active Problem List   Diagnosis Date Noted  . Toe amputation status, right (HCC)   . Pressure injury of head, stage 3 (HCC)   . Poor nutrition   . Brittle diabetes (HCC)   . Adjustment disorder with depressed mood   . Hypoglycemia   . Ischemic pain of right foot   . Arterial hypotension   . Diabetes mellitus type 2 in obese (HCC)   . PVD (peripheral vascular disease) (HCC)   . Delirium   . Unilateral complete BKA, right, sequela (HCC) 01/17/2017  . Acute blood loss anemia   . Elevated AST (SGOT) 01/09/2017  . Chest pain 01/09/2017  . NSTEMI (non-ST elevated myocardial infarction) (HCC) 01/09/2017  . Hypokalemia 01/07/2017  . Hypomagnesemia 01/07/2017  . Left foot infection 01/06/2017  . Chronic hypotension 01/06/2017  . 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 IDDM (insulin dependent diabetes mellitus) (HCC) 01/06/2017  . Foot infection 01/06/2017  . Labile blood pressure   . Hallucination   . Benign essential HTN   . Ulcer of toe of right foot (HCC)   . Ulcer of external ear, limited to breakdown of skin (HCC)   . Chronic congestive heart failure (HCC)   . Anemia of chronic disease   . Labile blood glucose   . 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 Diabetic peripheral neuropathy (HCC)   . Debility 12/27/2016   . History of transmetatarsal amputation of left foot (HCC) 12/27/2016  . Constipation   . Chronic obstructive pulmonary disease (HCC)   . Nausea   . ESRD on dialysis (HCC)   . Leukocytosis   . Tachypnea   . FUO (fever of unknown origin)   . Encephalopathy 12/23/2016  . Sore throat 12/23/2016  . Gangrene of left foot (HCC) 11/26/2016  . PAD (peripheral artery disease) (HCC) 11/26/2016  . Sjogren's syndrome (HCC) 09/26/2015  . Uncontrolled type 2 diabetes mellitus with hyperglycemia, with long-term current use of insulin (HCC) 09/26/2015  . Hyperosmolar non-ketotic state in patient with type 2 diabetes mellitus (HCC) 09/25/2015  . Ileus (HCC) 09/22/2015  . Acute respiratory failure with hypoxia (HCC) 09/22/2015  . Acute gouty arthritis 09/22/2015  . CAD (coronary artery disease) of artery bypass graft 09/22/2015  . Sepsis (HCC) 09/21/2015  . Elevated troponin level 09/21/2015  . ESRD on peritoneal dialysis (HCC) 09/21/2015  . CVA (cerebral infarction) 09/21/2015    Expected Discharge Date: Expected Discharge Date: 02/07/17  Team Members Present: Physician leading conference: Dr. Maryla Morrow Social Worker Present: Amada Jupiter, LCSW Nurse Present: Kennyth Arnold, RN PT Present: [name] Plump, PT OT Present: Rosalio Loud, OT SLP Present: Jackalyn Lombard, SLP PPS Coordinator present : Tora Duck, RN, CRRN     Current Status/Progress Goal Weekly Team Focus  Medical   Functional deficits secondary to left BKA  Improve  mobility, endurance, transfers, wounds, ESRD, conspitation, wounds, appetite  See above   Bowel/Bladder   mostly continent of b/b with Mod assist LBM 10/22 after lactulose, has scheduled stool softeners/laxatives, oliguric, uses urinal occasionally  maintain b/b with mod I assist  monitor b/b with mod I assist/medication as needed   Swallow/Nutrition/ Hydration             ADL's   mod-max asisst Lb bathing/dressing at bed level, min-max assist transfers with and  without slide board  supervision to min assist.  ? if pt will be able to achieve min assist/wife will be able to provide min-mod assist?  ADL retraining, sitting balance, transfers, endurance, strengthening   Mobility   mod> max slide board transfers, mod bed mobilty, S> min assist w/c propulsion wiht extensive extra time  downgraded to mod A  transfes, bed mobility, family ed, w/c propulsion   Communication             Safety/Cognition/ Behavioral Observations            Pain   intermittent pain to L leg relieved with occasional use of tylenol and /or oxycodone 2.5 mg, medicates for therapy also  <4  assess pain q shift and prn,    Skin   left BKA staples removed 10/19, open areas with some oozing, pressure dressings to  L BKA daily, Gerhardt's cream to buttocks prn, air mattress, right 5th metatarsal removal with gauze dressing daily. MASD under foreskin, Right Groin  wound with  6cm diameter hardened area and 1cm open area with aquacel and gauze dressing daily 100% slough.  maintain skin/incisions with mod assist  assess q shift and prn, treat wounds as ordered    Rehab Goals Patient on target to meet rehab goals: No Rehab Goals Revised: May need to downgrade some goals due to slow progress *See Care Plan and progress notes for long and short-term goals.     Barriers to Discharge  Current Status/Progress Possible Resolutions Date Resolved   Physician    Decreased caregiver support;Medical stability;Home environment access/layout;Wound Care;Lack of/limited family support;Other (comments)  PD  See above  Therapies, follow labs, Vasc consult, PRS consult in future, PD, appetite stimulant      Nursing                  PT                    OT Medical stability  unsure whether wife can provide level of physical assist pt will most likely require             SLP                SW                Discharge Planning/Teaching Needs:  Home with wife and family to provide 24/7  assistance.  Teaching with wife is ongoing   Team Discussion:  DM, constipation; vascular to see for multiple wounds and plastics to see on Friday for nose.  Transfers are improving but very inconsistent. Minimally motivated and very poor motor planning.  Min assist w/c goals may be lofty.  OT considering changing b/d to bed level and may need to decrease mobility and tf goals to mod assist.started family ed today with wife.  Do not feel that an extension of a few days would change the functional picture - SW to follow up with wife to determine if any concerns about  d/c home and meeting his care needs.  Revisions to Treatment Plan:  None yet    Continued Need for Acute Rehabilitation Level of Care: The patient requires daily medical management by a physician with specialized training in physical medicine and rehabilitation for the following conditions: Daily direction of a multidisciplinary physical rehabilitation program to ensure safe treatment while eliciting the highest outcome that is of practical value to the patient.: Yes Daily medical management of patient stability for increased activity during participation in an intensive rehabilitation regime.: Yes Daily analysis of laboratory values and/or radiology reports with any subsequent need for medication adjustment of medical intervention for : Post surgical problems;Diabetes problems;Wound care problems;Blood pressure problems;Renal problems;Nutritional problems;Other  Ronold Hardgrove 02/02/2017, 11:32 AM

## 2017-02-02 NOTE — Progress Notes (Signed)
Social Work Patient ID: Joshua [name], male   DOB: [birth year], 76 y.o.   MRN: [id number]   Have reviewed team conference with pt and spouse today.  They are aware that team continues to plan for d/c on 10/30. Wife reports that she has begun hands - on training with therapists and feels she will be able to manage his care at home.  We discussed HH and DME needs.  Will continue to follow.  Rithy Mandley, LCSW

## 2017-02-02 NOTE — Progress Notes (Signed)
Occupational Therapy daily and Weekly Progress Note  Patient Details  Name: [name] MRN: [id number] Date of Birth: 1941/01/17  Beginning of progress report period: January 27, 2017 End of progress report period: February 02, 2017  Today's Date: 02/02/2017 OT Individual Time: 1300-1400 OT Individual Time Calculation (min): 60 min  Daily Note: Family education and continued practice with transfers on and off the wide drop arm commode without slide board. Wife checked off to perform transfers in the room. Pt transferred to the mat with mod A from wife and got into prone position for ~10 min for prolonged stretching and left residual limb exercises.   Patient has met 1 of 3 short term goals and partially met one.  Pt's progress has been slow and inconsistent at times. Pt can bathe LB at bed level with setup and UB at sink in w/c with setup. Pt inconsistently transfers with mod A with both methods slide board and squat pivot. Pt's wife has begun hands on training with transfers and now the wife and pt are completing bathing and dressing at bed level. Pt with difficulty coming into standing due to pain and requires A grab bar to pull and with max A. Pt currently is max A for toileting on BSC. Pt will d/c home with power chair (seat to floor height 20 inches). And will not have access to bathroom.   Patient continues to demonstrate the following deficits: muscle weakness, decreased cardiorespiratoy endurance, decreased memory and delayed processing and decreased standing balance and decreased balance strategies and therefore will continue to benefit from skilled OT intervention to enhance overall performance with BADL and Reduce care partner burden.  Patient progressing toward long term goals.. Some goals modified.  Continue plan of care.  OT Short Term Goals Week 2:  OT Short Term Goal 1 (Week 2): Pt will transfer to drop arm commode with slide board mod assist OT Short Term Goal 1 - Progress  (Week 2): Met OT Short Term Goal 2 (Week 2): Pt will perform sit to stand with mod A in prep for clothing management OT Short Term Goal 2 - Progress (Week 2): Discontinued (comment) OT Short Term Goal 3 (Week 2): Pt will don pants in sitting with lateral leans to pull pants over hips post toileting with min assist OT Short Term Goal 3 - Progress (Week 2): Partly met Week 3:  OT Short Term Goal 1 (Week 3): Pt will perform toileting with mod A on BSC (with lateral leans) OT Short Term Goal 2 (Week 3): Family education with wife will be completed for transfer training  OT Short Term Goal 3 (Week 3): Pt will perform transfers with consistant mod A   Skilled Therapeutic Interventions/Progress Updates:      Therapy Documentation Precautions:  Precautions Precautions: Fall Precaution Comments: PD port, VAC left residual limb Restrictions Weight Bearing Restrictions: Yes LLE Weight Bearing: Non weight bearing LLE Partial Weight Bearing Percentage or Pounds: Darco shoe General:   Vital Signs: Therapy Vitals Temp: 97.6 F (36.4 C) Temp Source: Oral Pulse Rate: 86 Resp: 18 BP: (!) 152/59 Patient Position (if appropriate): Lying Oxygen Therapy SpO2: 99 % O2 Device: Not Delivered Pain:   ADL:   Vision   Perception    Praxis   Exercises:   Other Treatments:    See Function Navigator for Current Functional Status.   Therapy/Group: Individual Therapy  Willeen Cass [facility] 02/02/2017, 8:25 AM

## 2017-02-02 NOTE — Progress Notes (Signed)
Occupational Therapy Session Note  Patient Details  Name: Joshua [name] MRN: [id number] Date of Birth: January 13, 1941  Today's Date: 02/02/2017 OT Individual Time: 1415-1455 OT Individual Time Calculation (min): 40 min    Short Term Goals: Week 3:  OT Short Term Goal 1 (Week 3): Pt will perform toileting with mod A on BSC (with lateral leans) OT Short Term Goal 2 (Week 3): Family education with wife will be completed for transfer training  OT Short Term Goal 3 (Week 3): Pt will perform transfers with consistant mod A   Skilled Therapeutic Interventions/Progress Updates:    Treatment session with focus on family education regarding transfers to various surfaces.  Utilized [employer] board for all transfers with focus on wife and pt setting up positioning of w/c and transport chair to practice transfers depending upon type of chair MD offices may have.  Min question cues for proper placement of slide board and increased ease with removing slide board while ensuring positioning of pt.  Pt and wife demonstrating improved communication throughout transfers.    Therapy Documentation Precautions:  Precautions Precautions: Fall Precaution Comments: PD port, VAC left residual limb Restrictions Weight Bearing Restrictions: Yes LLE Weight Bearing: Non weight bearing LLE Partial Weight Bearing Percentage or Pounds: Darco shoe Pain: Pain Assessment Pain Score: 6  RN notified  See Function Navigator for Current Functional Status.   Therapy/Group: Individual Therapy  Rosalio LoudHOXIE, Elena Cothern 02/02/2017, 3:17 PM

## 2017-02-02 NOTE — Plan of Care (Signed)
Problem: RH Balance Goal: LTG Patient will maintain dynamic standing with ADLs (OT) LTG:  Patient will maintain dynamic standing balance with assist during activities of daily living (OT)   Outcome: Not Applicable Date Met: 99/78/02 D/C as not a focus of therapy at this time, focusing on lateral leans and transfers  Problem: RH Toileting Goal: LTG Patient will perform toileting w/assist, cues/equip (OT) LTG: Patient will perform toiletiing (clothes management/hygiene) with assist, with/without cues using equipment (OT)  Downgraded due to slow progress  Problem: RH Tub/Shower Transfers Goal: LTG Patient will perform tub/shower transfers w/assist (OT) LTG: Patient will perform tub/shower transfers with assist, with/without cues using equipment (OT)  Outcome: Not Applicable Date Met: 08/91/00 D/C as not a focus of therapy at this time

## 2017-02-02 NOTE — Progress Notes (Signed)
Social Work Patient ID: [name], male   DOB: December 17, 1940, 76 y.o.   MRN: [id number]   Deatra Ina Social Worker Signed   Patient Care Conference Date of Service: 02/02/2017 11:31 AM      [] Hide copied text [] Hover for attribution information Inpatient RehabilitationTeam Conference and Plan of Care Update Date: 02/01/2017   Time: 2:30 PM    Patient Name: [name]      Medical Record Number: [id number]  Date of Birth: 08-Nov-1940 Sex: Male         Room/Bed: 4W05C/4W05C-01 Payor Info: Payor: MEDICARE / Plan: MEDICARE PART A AND B / Product Type: *No Product type* /    Admitting Diagnosis: L BKA nonviable tissue  Admit Date/Time:  01/17/2017  5:16 PM Admission Comments: No comment available   Primary Diagnosis:  Unilateral complete BKA, right, sequela (HCC) Principal Problem: Unilateral complete BKA, right, sequela [facility])      Patient Active Problem List   Diagnosis Date Noted  . Toe amputation status, right (HCC)   . Pressure injury of head, stage 3 (HCC)   . Poor nutrition   . Brittle diabetes (HCC)   . Adjustment disorder with depressed mood   . Hypoglycemia   . Ischemic pain of right foot   . Arterial hypotension   . Diabetes mellitus type 2 in obese (HCC)   . PVD (peripheral vascular disease) (HCC)   . Delirium   . Unilateral complete BKA, right, sequela (HCC) 01/17/2017  . Acute blood loss anemia   . Elevated AST (SGOT) 01/09/2017  . Chest pain 01/09/2017  . NSTEMI (non-ST elevated myocardial infarction) (HCC) 01/09/2017  . Hypokalemia 01/07/2017  . Hypomagnesemia 01/07/2017  . Left foot infection 01/06/2017  . Chronic hypotension 01/06/2017  . 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 IDDM (insulin dependent diabetes mellitus) (HCC) 01/06/2017  . Foot infection 01/06/2017  . Labile blood pressure   . Hallucination   . Benign essential HTN   . Ulcer of toe of right foot (HCC)   . Ulcer of  external ear, limited to breakdown of skin (HCC)   . Chronic congestive heart failure (HCC)   . Anemia of chronic disease   . Labile blood glucose   . 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 Diabetic peripheral neuropathy (HCC)   . Debility 12/27/2016  . History of transmetatarsal amputation of left foot (HCC) 12/27/2016  . Constipation   . Chronic obstructive pulmonary disease (HCC)   . Nausea   . ESRD on dialysis (HCC)   . Leukocytosis   . Tachypnea   . FUO (fever of unknown origin)   . Encephalopathy 12/23/2016  . Sore throat 12/23/2016  . Gangrene of left foot (HCC) 11/26/2016  . PAD (peripheral artery disease) (HCC) 11/26/2016  . Sjogren's syndrome (HCC) 09/26/2015  . Uncontrolled type 2 diabetes mellitus with hyperglycemia, with long-term current use of insulin (HCC) 09/26/2015  . Hyperosmolar non-ketotic state in patient with type 2 diabetes mellitus (HCC) 09/25/2015  . Ileus (HCC) 09/22/2015  . Acute respiratory failure with hypoxia (HCC) 09/22/2015  . Acute gouty arthritis 09/22/2015  . CAD (coronary artery disease) of artery bypass graft 09/22/2015  . Sepsis (HCC) 09/21/2015  . Elevated troponin level 09/21/2015  . ESRD on peritoneal dialysis (HCC) 09/21/2015  . CVA (cerebral infarction) 09/21/2015    Expected Discharge Date: Expected Discharge Date: 02/07/17  Team Members Present: Physician leading conference: Dr. Maryla Morrow Social Worker  Present: Amada Jupiter, LCSW Nurse Present: Kennyth Arnold, RN PT Present: [name] Plump, PT OT Present: Rosalio Loud, OT SLP Present: Jackalyn Lombard, SLP PPS Coordinator present : Tora Duck, RN, CRRN     Current Status/Progress Goal Weekly Team Focus  Medical   Functional deficits secondary to left BKA  Improve mobility, endurance, transfers, wounds, ESRD, conspitation, wounds, appetite  See above   Bowel/Bladder   mostly continent of b/b with Mod assist LBM 10/22  after lactulose, has scheduled stool softeners/laxatives, oliguric, uses urinal occasionally  maintain b/b with mod I assist  monitor b/b with mod I assist/medication as needed   Swallow/Nutrition/ Hydration             ADL's   mod-max asisst Lb bathing/dressing at bed level, min-max assist transfers with and without slide board  supervision to min assist.  ? if pt will be able to achieve min assist/wife will be able to provide min-mod assist?  ADL retraining, sitting balance, transfers, endurance, strengthening   Mobility   mod> max slide board transfers, mod bed mobilty, S> min assist w/c propulsion wiht extensive extra time  downgraded to mod A  transfes, bed mobility, family ed, w/c propulsion   Communication             Safety/Cognition/ Behavioral Observations            Pain   intermittent pain to L leg relieved with occasional use of tylenol and /or oxycodone 2.5 mg, medicates for therapy also  <4  assess pain q shift and prn,    Skin   left BKA staples removed 10/19, open areas with some oozing, pressure dressings to  L BKA daily, Gerhardt's cream to buttocks prn, air mattress, right 5th metatarsal removal with gauze dressing daily. MASD under foreskin, Right Groin  wound with  6cm diameter hardened area and 1cm open area with aquacel and gauze dressing daily 100% slough.  maintain skin/incisions with mod assist  assess q shift and prn, treat wounds as ordered    Rehab Goals Patient on target to meet rehab goals: No Rehab Goals Revised: May need to downgrade some goals due to slow progress *See Care Plan and progress notes for long and short-term goals.     Barriers to Discharge  Current Status/Progress Possible Resolutions Date Resolved   Physician    Decreased caregiver support;Medical stability;Home environment access/layout;Wound Care;Lack of/limited family support;Other (comments)  PD  See above  Therapies, follow labs, Vasc consult, PRS consult in  future, PD, appetite stimulant      Nursing                PT                    OT Medical stability  unsure whether wife can provide level of physical assist pt will most likely require             SLP            SW            Discharge Planning/Teaching Needs:  Home with wife and family to provide 24/7 assistance.  Teaching with wife is ongoing   Team Discussion:  DM, constipation; vascular to see for multiple wounds and plastics to see on Friday for nose.  Transfers are improving but very inconsistent. Minimally motivated and very poor motor planning.  Min assist w/c goals may be lofty.  OT considering changing b/d to bed level and may  need to decrease mobility and tf goals to mod assist.started family ed today with wife.  Do not feel that an extension of a few days would change the functional picture - SW to follow up with wife to determine if any concerns about d/c home and meeting his care needs.  Revisions to Treatment Plan:  None yet    Continued Need for Acute Rehabilitation Level of Care: The patient requires daily medical management by a physician with specialized training in physical medicine and rehabilitation for the following conditions: Daily direction of a multidisciplinary physical rehabilitation program to ensure safe treatment while eliciting the highest outcome that is of practical value to the patient.: Yes Daily medical management of patient stability for increased activity during participation in an intensive rehabilitation regime.: Yes Daily analysis of laboratory values and/or radiology reports with any subsequent need for medication adjustment of medical intervention for : Post surgical problems;Diabetes problems;Wound care problems;Blood pressure problems;Renal problems;Nutritional problems;Other  Lan Mcneill 02/02/2017, 11:32 AM

## 2017-02-02 NOTE — Progress Notes (Signed)
Physical Therapy Weekly Progress Note  Patient Details  Name: [name] KitchenJames D Brunelle MRN: [id number] Date of Birth: May 23, 1940  Beginning of progress report period: January 26, 2017 End of progress report period: February 02, 2017   Patient is making slow progress towards long term goals. Pt improving participation with therapy but continues to require mod/max A for transfers with sliding board.  Pt/wife have begun family ed in anticipation of d/c next week.  Patient continues to demonstrate the following deficits muscle weakness, decreased cardiorespiratoy endurance and decreased sitting balance, decreased standing balance, decreased postural control and decreased balance strategies and therefore will continue to benefit from skilled PT intervention to increase functional independence with mobility.  Patient not progressing toward long term goals.  See goal revision..  Continue plan of care.   Skilled Therapeutic Interventions/Progress Updates:  Ambulation/gait training;Balance/vestibular training;Community reintegration;Discharge planning;Disease management/prevention;DME/adaptive equipment instruction;Functional mobility training;Neuromuscular re-education;Pain management;Patient/family education;Psychosocial support;Skin care/wound management;Stair training;Therapeutic Activities;Therapeutic Exercise;UE/LE Strength taining/ROM;Wheelchair propulsion/positioning    See Function Navigator for Current Functional Status.  Suheily Birks 02/02/2017, 8:10 AM

## 2017-02-02 NOTE — Progress Notes (Signed)
Physical Therapy Note  Patient Details  Name: [name] KitchenJames D Traxler MRN: [id number] Date of Birth: 03/10/1941 Today's Date: 02/02/2017    Time: 1100-1155 55 minutes  1:1 No c/o pain.  Session focused on car transfer to pt's small SUV.  PT and pt performed sliding board transfer to car and transfer without sliding board out of car at mod A level.  Pt and wife then performed car transfer at mod A  Level with cues for PT.  Pt and wife educated on PT recommendation to have +2 assist to steady w/c or assist when pt is more fatigued, both verbalize understanding.  Pt performed w/c mobility in controlled environment 50' with min A.  Seated quad sets for Lt knee and residual limb placement.  Rt LAQ, hip flex for strengthening.   Landrum Carbonell 02/02/2017, 12:21 PM

## 2017-02-03 ENCOUNTER — Inpatient Hospital Stay (HOSPITAL_COMMUNITY): Payer: Medicare Other | Admitting: Physical Therapy

## 2017-02-03 ENCOUNTER — Inpatient Hospital Stay (HOSPITAL_COMMUNITY): Payer: Medicare Other | Admitting: Occupational Therapy

## 2017-02-03 ENCOUNTER — Telehealth: Payer: Self-pay | Admitting: Surgery

## 2017-02-03 ENCOUNTER — Inpatient Hospital Stay (HOSPITAL_COMMUNITY): Payer: Medicare Other

## 2017-02-03 LAB — GLUCOSE, CAPILLARY
GLUCOSE-CAPILLARY: 225 mg/dL — AB (ref 65–99)
GLUCOSE-CAPILLARY: 59 mg/dL — AB (ref 65–99)
GLUCOSE-CAPILLARY: 65 mg/dL (ref 65–99)
GLUCOSE-CAPILLARY: 165 mg/dL — AB (ref 65–99)
Glucose-Capillary: 136 mg/dL — ABNORMAL HIGH (ref 65–99)
Glucose-Capillary: 125 mg/dL — ABNORMAL HIGH (ref 65–99)

## 2017-02-03 LAB — PARATHYROID HORMONE, INTACT (NO CA): PTH: 57 pg/mL (ref 15–65)

## 2017-02-03 MED ORDER — SODIUM CHLORIDE 0.9 % IV SOLN
510.0000 mg | Freq: Once | INTRAVENOUS | Status: AC
  Administered 2017-02-03: 510 mg via INTRAVENOUS
  Filled 2017-02-03: qty 17

## 2017-02-03 NOTE — Progress Notes (Signed)
[place] PHYSICAL MEDICINE & REHABILITATION     PROGRESS NOTE  Subjective/Complaints:  Pt seen laying in bed this AM.  He slept well overnight.  Wife not at bedside this AM.    ROS: Denies nausea, vomiting, diarrhea, shortness of breath or chest pain   Objective: Vital Signs: Blood pressure (!) 141/60, pulse 81, temperature 97.8 F (36.6 C), temperature source Oral, resp. rate 14, height 5\' 9"  (1.753 m), weight 78 kg (171 lb 15.3 oz), SpO2 100 %. No results found.  Recent Labs  01/31/17 1440 02/02/17 0705  WBC 9.0 6.9  HGB 10.2* 9.7*  HCT 32.1* 31.1*  PLT 462* 445*    Recent Labs  02/02/17 0705  NA 132*  K 3.1*  CL 92*  GLUCOSE 187*  BUN 69*  CREATININE 6.25*  CALCIUM 8.4*   CBG (last 3)   Recent Labs  02/02/17 2103 02/03/17 0607 02/03/17 1201  GLUCAP 157* 225* 136*    Wt Readings from Last 3 Encounters:  02/03/17 78 kg (171 lb 15.3 oz)  01/17/17 86.7 kg (191 lb 2.2 oz)  01/05/17 87.9 kg (193 lb 12.6 oz)    Physical Exam:  BP (!) 141/60 (BP Location: Right Wrist)   Pulse 81   Temp 97.8 F (36.6 C) (Oral)   Resp 14   Ht 5\' 9"  (1.753 m)   Wt 78 kg (171 lb 15.3 oz)   SpO2 100%   BMI 25.39 kg/m  Constitutional: He appears well-nourished. NAD. Obese. HENT: Normocephalic. Areas of abrasion healing, except nose.  Eyes: EOMI. No discharge.  Cardiovascular RRR. No JVD . Respiratory: Effort normal and breath sounds normal.  GI: Soft. Bowel sounds are normal.   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  Motor: 4+/5 grossly throughout (stable) Skin:  BKA incision with dressing c/d/i.  Right 5th dig amp with dressing Right groin with ulcer and induration, not examined today Psychiatric: His speech is normal. He is withdrawn.   Assessment/Plan: 1. Functional deficits secondary to left BKA which require 3+ hours per day of interdisciplinary therapy in a comprehensive inpatient rehab  setting. Physiatrist is providing close team supervision and 24 hour management of active medical problems listed below. Physiatrist and rehab team continue to assess barriers to discharge/monitor patient progress toward functional and medical goals.  Function:  Bathing Bathing position   Position: Wheelchair/chair at sink  Bathing parts Body parts bathed by patient: Right arm, Left arm, Chest, Abdomen (only completed UB bathing this session) Body parts bathed by helper: Back  Bathing assist Assist Level: Set up, Supervision or verbal cues   Set up : To obtain items  Upper Body Dressing/Undressing Upper body dressing   What is the patient wearing?: Pull over shirt/dress     Pull over shirt/dress - Perfomed by patient: Thread/unthread right sleeve, Thread/unthread left sleeve, Put head through opening, Pull shirt over trunk Pull over shirt/dress - Perfomed by helper: Pull shirt over trunk        Upper body assist Assist Level: Touching or steadying assistance(Pt > 75%)      Lower Body Dressing/Undressing Lower body dressing   What is the patient wearing?: Pants, Socks, Shoes Underwear - Performed by patient: Thread/unthread left underwear leg, Pull underwear up/down Underwear - Performed by helper: Thread/unthread right underwear leg Pants- Performed by patient: Thread/unthread right pants leg, Pull pants up/down Pants- Performed by helper: Thread/unthread left pants leg   Non-skid slipper socks- Performed by helper: Don/doff  right sock   Socks - Performed by helper: Don/doff right sock   Shoes - Performed by helper: Don/doff right shoe          Lower body assist Assist for lower body dressing:  (Max assist)      Toileting Toileting Toileting activity did not occur: No continent bowel/bladder event Toileting steps completed by patient: Adjust clothing prior to toileting, Performs perineal hygiene Toileting steps completed by helper: Adjust clothing prior to toileting,  Performs perineal hygiene, Adjust clothing after toileting Toileting Assistive Devices: Toilet aid  Toileting assist Assist level: Two helpers   Transfers Chair/bed transfer   Chair/bed transfer method: Lateral scoot Chair/bed transfer assist level: Maximal assist (Pt 25 - 49%/lift and lower) Chair/bed transfer assistive device: Armrests, Sliding board Mechanical lift: Stedy   Locomotion Ambulation Ambulation activity did not occur: Safety/medical concerns         Wheelchair   Type: Manual Max wheelchair distance: 50 Assist Level: Supervision or verbal cues  Cognition Comprehension Comprehension assist level: Follows basic conversation/direction with no assist  Expression Expression assist level: Expresses basic needs/ideas: With extra time/assistive device  Social Interaction Social Interaction assist level: Interacts appropriately 90% of the time - Needs monitoring or encouragement for participation or interaction.  Problem Solving Problem solving assist level: Solves basic 90% of the time/requires cueing < 10% of the time  Memory Memory assist level: Recognizes or recalls 75 - 89% of the time/requires cueing 10 - 24% of the time    Medical Problem List and Plan: 1. Functional deficitssecondary to left BKA  Cont CIR, 15/7  May weight bear through the heel on the right side 2. DVT Prophylaxis/Anticoagulation: Pharmaceutical: Heparin 3. Pain Management: Tylenol or Celebrex prn. Unable to tolerate muscle relaxers or pain medications due to SE of tremors, confusion and hallucinations.  4. Mood: LCSW to follow for evaluation and support.  5. Neuropsych: This patient is not fullycapable of making decisions on his own behalf. 6. Skin/Wound Care: Routine pressure relief measures. Prevalon boot for right foot. Monitor daily for intake to promote healing. Protein supplement and vitamins to promote wound healing.   Per vascular, cont to monitor wounds 7.  Fluids/Electrolytes/Nutrition: Monitor I/O. Lytes/CBC per PD protocol.   Requires encouragement for nutrition  Remeron 15 mg started on 10/17, increased on 10/23 8. Leucocytosis: Resolved   Monitor for fevers and other sign of infection.  Afebrile  Cont to monitor 9. Hypotension/HTN: Monitor BP bid   Vitals:   02/02/17 1925 02/03/17 0526  BP: (!) 133/50 (!) 141/60  Pulse: 87 81  Resp: 18 14  Temp: 98.2 F (36.8 C) 97.8 F (36.6 C)  SpO2: 100% 100%   Midodrine d/ced 10/22  Relatively controlled on 10/26 10. ESRD on PD: Schedule PD in evening and early am--unhook by breakfast.  11. Anemia of chronic disease: On aranesp every week.   Hb 9.7 on 10/25  Cont to monitor 12. T2DM insulin dependent: Brittle diabetic who's hard to control in part due to inconsistent intake. Changed to Premier protein with meals.   NPH decreased to to 15u qam   NPH 60u qpm CBG (last 3)   Recent Labs  02/02/17 2103 02/03/17 0607 02/03/17 1201  GLUCAP 157* 225* 136*   Remains labile, but overall controlled 10/26 13. Constipation: Bowel reg increased on 10/22, again on 10/24 14. PVD with s/p left femoral pseudoaneurysm repair.   Prevalon boot for RLE with pressure relief measures.  15. Delirium: ?Resolved  LOS (Days) 17 A  FACE TO FACE EVALUATION WAS PERFORMED  Donelle Baba Karis Jubanil Domonick Sittner 02/03/2017 12:39 PM

## 2017-02-03 NOTE — Progress Notes (Signed)
Occupational Therapy Session Note  Patient Details  Name: HALEY ROZA MRN: [id number] Date of Birth: 11-Aug-1940 26 min  Skilled Therapeutic Interventions/Progress Updates:    1:1. Pt finishing transfusion upon entering with RN in room. Pt c/o back pain and RN aware. Pt sits EOB to complete 2x10 beach ball ROM exercises shoulder flex/ext, int/ext rotation, horizontal ab/adduction, shoulder press, "rainbow arcs," elbow flex/ext and ant/poterior weight shift to target. Pt completes ball toss and hits ball with 2# dowel rod to improve functional endurance and BUE coordination required for BADLs. Exited session with pt seated in bed with call light in reach and all needs met.   Therapy Documentation Precautions:  Precautions Precautions: Fall Precaution Comments: PD port, VAC left residual limb Restrictions Weight Bearing Restrictions: Yes LLE Weight Bearing: Non weight bearing LLE Partial Weight Bearing Percentage or Pounds: Darco shoe ] See Function Navigator for Current Functional Status.   Therapy/Group: Individual Therapy  Tonny Branch 02/03/2017, 6:47 AM

## 2017-02-03 NOTE — Telephone Encounter (Signed)
Sched appt 02/13/17 at 1:15. Spoke to pt's wife.

## 2017-02-03 NOTE — Telephone Encounter (Signed)
-----   Message from Sharee PimpleMarilyn K McChesney, RN sent at 02/02/2017  9:41 AM EDT ----- Regarding: 2-3 weeks suture removal   ----- Message ----- From: Nada LibmanBrabham, Vance W, MD Sent: 02/01/2017   8:27 PM To: [facility] Charge Pool  Please schedule follow-up for the patient and 2-3 weeks for suture removal.

## 2017-02-03 NOTE — Progress Notes (Signed)
Occupational Therapy Session Note  Patient Details  Name: Joshua [name] MRN: [id number] Date of Birth: 1941/02/18  Today's Date: 02/03/2017 OT Individual Time: 1100-1200 OT Individual Time Calculation (min): 60 min    Short Term Goals: Week 3:  OT Short Term Goal 1 (Week 3): Pt will perform toileting with mod A on BSC (with lateral leans) OT Short Term Goal 2 (Week 3): Family education with wife will be completed for transfer training  OT Short Term Goal 3 (Week 3): Pt will perform transfers with consistant mod A  Week 4:     Skilled Therapeutic Interventions/Progress Updates:    1:1 Pt up in the w/c when arrived dressed. REported that wife and pt performed bathing and dressing at bed level and [facility] with success Independently without staff's A. Focus on trial of tub bench transfer into tub as an option for the future. Pt and wife able to perform transfer with pt performing mod A in and out. Pt also perform transfer in and out of recliner with min A with slide board with wife's A.  Continued functional problem solving about home setup and community outings.  Therapy Documentation Precautions:  Precautions Precautions: Fall Precaution Comments: PD port, VAC left residual limb Restrictions Weight Bearing Restrictions: Yes LLE Weight Bearing: Non weight bearing LLE Partial Weight Bearing Percentage or Pounds: Darco shoe Pain: Pain Assessment Pain Assessment: 0-10 Pain Score: 6  Pain Type: Surgical pain Pain Location: Leg Pain Orientation: Left Pain Descriptors / Indicators: Burning;Aching;Constant Pain Onset: On-going Patients Stated Pain Goal: 2 Pain Intervention(s): Medication (See eMAR) rest at end of session  See Function Navigator for Current Functional Status.   Therapy/Group: Individual Therapy  Roney MansSmith, Jisele Price [facility] 02/03/2017, 2:11 PM

## 2017-02-03 NOTE — Progress Notes (Signed)
Physical Therapy Note  Patient Details  Name: Joshua [name] MRN: [id number] Date of Birth: 1941-01-25 Today's Date: 02/03/2017    Time: 1400-1443 43 minutes  1:1 no c/o pain.  Pt and wife state they would like to attempt simulated car transfer again.  Pt/wife perform sliding board transfer with max A to simulated small SUV.  Pt/wife require cuing for safe set up but then able to perform without cues.  [name] propulsion 50' x 2 in home and controlled environments with supervision.  PROM and soft tissue mobilization to promote Lt knee extension within pt's tolerance.  PT continues to emphasize importance of Lt knee extension, pt verbalizes understanding but states he is limited by pain.  Pt and wife perform sliding board transfer back to bed with mod A.  Pt missed final 15 minutes of session due to nursing needing to give iron IV.   Irelynn Schermerhorn 02/03/2017, 2:48 PM

## 2017-02-03 NOTE — Progress Notes (Signed)
Subjective: Interval History: has complaints better, still weak.  Objective: Vital signs in last 24 hours: Temp:  [97.8 F (36.6 C)-98.2 F (36.8 C)] 97.8 F (36.6 C) (10/26 0526) Pulse Rate:  [81-87] 81 (10/26 0526) Resp:  [14-18] 14 (10/26 0526) BP: (122-141)/(50-60) 141/60 (10/26 0526) SpO2:  [98 %-100 %] 100 % (10/26 0526) Weight:  [62 kg (136 lb 11 oz)-78 kg (171 lb 15.3 oz)] 78 kg (171 lb 15.3 oz) (10/26 0526) Weight change: 2 kg (4 lb 6.6 oz)  Intake/Output from previous day: 10/25 0701 - 10/26 0700 In: [id number] [P.O.:380] Out: 13339  Intake/Output this shift: No intake/output data recorded.  General appearance: alert, cooperative and plethoric Resp: diminished breath sounds bilaterally Cardio: S1, S2 normal and systolic murmur: systolic ejection 2/6, decrescendo at 2nd left intercostal space GI: pos bs, pos FW,  soft, PD cath LUQ ExtremitieLBK, boot RLBK, boot R  Lab Results:  Recent Labs  01/31/17 1440 02/02/17 0705  WBC 9.0 6.9  HGB 10.2* 9.7*  HCT 32.1* 31.1*  PLT 462* 445*   BMET:  Recent Labs  02/02/17 0705  NA 132*  K 3.1*  CL 92*  CO2 28  GLUCOSE 187*  BUN 69*  CREATININE 6.25*  CALCIUM 8.4*    Recent Labs  02/02/17 0705  PTH 57   Iron Studies:  Recent Labs  02/02/17 0705  IRON 28*  TIBC 158*    Studies/Results: No results found.  I have reviewed the patient's current medications.  Assessment/Plan: 1 ESRD PD going well mechanically.  Lower vol. 2 HTN improving will lower carvedilol 3 Anemia give Fe, cont esa 4 DM controlled 5 HPTH controlled 6 Debill per rehab 7 PVD P PD, 2.5, lower coreg, iv Fe     LOS: 17 days   Yovan Leeman,Sadiel L 02/03/2017,9:59 AM

## 2017-02-04 ENCOUNTER — Inpatient Hospital Stay (HOSPITAL_COMMUNITY): Payer: Medicare Other | Admitting: Occupational Therapy

## 2017-02-04 ENCOUNTER — Inpatient Hospital Stay (HOSPITAL_COMMUNITY): Payer: Medicare Other

## 2017-02-04 ENCOUNTER — Inpatient Hospital Stay (HOSPITAL_COMMUNITY): Payer: Medicare Other | Admitting: Physical Therapy

## 2017-02-04 LAB — GLUCOSE, CAPILLARY
GLUCOSE-CAPILLARY: 145 mg/dL — AB (ref 65–99)
GLUCOSE-CAPILLARY: 148 mg/dL — AB (ref 65–99)
GLUCOSE-CAPILLARY: 84 mg/dL (ref 65–99)
GLUCOSE-CAPILLARY: 214 mg/dL — AB (ref 65–99)
GLUCOSE-CAPILLARY: 228 mg/dL — AB (ref 65–99)
Glucose-Capillary: 254 mg/dL — ABNORMAL HIGH (ref 65–99)
Glucose-Capillary: 66 mg/dL (ref 65–99)

## 2017-02-04 NOTE — Progress Notes (Signed)
[facility] PHYSICAL MEDICINE & REHABILITATION     PROGRESS NOTE  Subjective/Complaints:  No complaints. Just waking up. PD without issue. Wife at bedside  ROS: pt denies nausea, vomiting, diarrhea, cough, shortness of breath or chest pain  Objective: Vital Signs: Blood pressure 138/76, pulse 91, temperature 97.7 F (36.5 C), temperature source Axillary, resp. rate (!) 22, height 5\' 9"  (1.753 m), weight 73 kg (160 lb 15 oz), SpO2 98 %. No results found.  Recent Labs  02/02/17 0705  WBC 6.9  HGB 9.7*  HCT 31.1*  PLT 445*    Recent Labs  02/02/17 0705  NA 132*  K 3.1*  CL 92*  GLUCOSE 187*  BUN 69*  CREATININE 6.25*  CALCIUM 8.4*   CBG (last 3)   Recent Labs  02/03/17 2002 02/03/17 2111 02/04/17 0633  GLUCAP 125* 165* 254*    Wt Readings from Last 3 Encounters:  02/04/17 73 kg (160 lb 15 oz)  01/17/17 86.7 kg (191 lb 2.2 oz)  01/05/17 87.9 kg (193 lb 12.6 oz)    Physical Exam:  BP 138/76 (BP Location: Right Arm)   Pulse 91   Temp 97.7 F (36.5 C) (Axillary)   Resp (!) 22   Ht 5\' 9"  (1.753 m)   Wt 73 kg (160 lb 15 oz)   SpO2 98%   BMI 23.77 kg/m  Constitutional: He appears well-nourished. NAD. Obese. HENT: Normocephalic. Areas of abrasion healing, except nose.  Eyes: EOMI. No discharge.  Cardiovascular RRR without murmur. No JVD  . Respiratory: CTA Bilaterally without wheezes or rales. Normal effort .  GI: Soft. Bowel sounds are normal.   Musculoskeletal: He exhibits edemaand tenderness Left BKA dressed Right 5th digit ampuation Neurological: He is alert.  Motor: 4+/5 grossly throughout (stable) Skin:  BKA incision with dressing c/d/i.  Right 5th dig amp with dressing Right groin with ulcer and induration, not examined today Psychiatric: His speech is normal. He is flat   Assessment/Plan: 1. Functional deficits secondary to left BKA which require 3+ hours per day of interdisciplinary therapy in a comprehensive inpatient rehab  setting. Physiatrist is providing close team supervision and 24 hour management of active medical problems listed below. Physiatrist and rehab team continue to assess barriers to discharge/monitor patient progress toward functional and medical goals.  Function:  Bathing Bathing position   Position: Bed  Bathing parts Body parts bathed by patient: Right arm, Left arm, Chest, Abdomen, Front perineal area, Buttocks, Right upper leg, Left upper leg Body parts bathed by helper: Right lower leg, Back  Bathing assist Assist Level: Set up   Set up : To obtain items  Upper Body Dressing/Undressing Upper body dressing   What is the patient wearing?: Pull over shirt/dress     Pull over shirt/dress - Perfomed by patient: Thread/unthread right sleeve, Thread/unthread left sleeve, Put head through opening, Pull shirt over trunk Pull over shirt/dress - Perfomed by helper: Pull shirt over trunk        Upper body assist Assist Level: Set up      Lower Body Dressing/Undressing Lower body dressing   What is the patient wearing?: Pants, Socks, Shoes Underwear - Performed by patient: Thread/unthread left underwear leg, Pull underwear up/down, Thread/unthread right underwear leg Underwear - Performed by helper: Thread/unthread right underwear leg Pants- Performed by patient: Thread/unthread right pants leg, Thread/unthread left pants leg, Pull pants up/down Pants- Performed by helper: Thread/unthread left pants leg   Non-skid slipper socks- Performed by helper: Don/doff right sock  Socks - Performed by helper: Don/doff right sock   Shoes - Performed by helper: Don/doff right shoe          Lower body assist Assist for lower body dressing: Set up      Toileting Toileting Toileting activity did not occur: No continent bowel/bladder event Toileting steps completed by patient: Adjust clothing prior to toileting, Performs perineal hygiene, Adjust clothing after toileting Toileting steps  completed by helper: Adjust clothing prior to toileting, Performs perineal hygiene, Adjust clothing after toileting Toileting Assistive Devices: Grab bar or rail  Toileting assist Assist level: Supervision or verbal cues   Transfers Chair/bed transfer   Chair/bed transfer method: Lateral scoot Chair/bed transfer assist level: Maximal assist (Pt 25 - 49%/lift and lower) Chair/bed transfer assistive device: Armrests, Sliding board Mechanical lift: Stedy   Locomotion Ambulation Ambulation activity did not occur: Safety/medical concerns         Wheelchair   Type: Manual Max wheelchair distance: 50 Assist Level: Supervision or verbal cues  Cognition Comprehension Comprehension assist level: Understands basic 90% of the time/cues < 10% of the time  Expression Expression assist level: Expresses basic 90% of the time/requires cueing < 10% of the time.  Social Interaction Social Interaction assist level: Interacts appropriately 90% of the time - Needs monitoring or encouragement for participation or interaction.  Problem Solving Problem solving assist level: Solves complex 90% of the time/cues < 10% of the time  Memory Memory assist level: Recognizes or recalls 90% of the time/requires cueing < 10% of the time    Medical Problem List and Plan: 1. Functional deficitssecondary to left BKA  Cont CIR, 15/7  May weight bear through the heel on the right side 2. DVT Prophylaxis/Anticoagulation: Pharmaceutical: Heparin 3. Pain Management: Tylenol or Celebrex prn. Unable to tolerate muscle relaxers or pain medications due to SE of tremors, confusion and hallucinations.  4. Mood: LCSW to follow for evaluation and support.  5. Neuropsych: This patient is not fullycapable of making decisions on his own behalf. 6. Skin/Wound Care: Routine pressure relief measures. Prevalon boot for right foot. Monitor daily for intake to promote healing. Protein supplement and vitamins to promote wound healing.    Per vascular, cont to monitor wounds--stable 7. Fluids/Electrolytes/Nutrition: Monitor I/O. Lytes/CBC per PD protocol.   Requires encouragement for nutrition  Remeron 15 mg started on 10/17, increased on 10/23 8. Leucocytosis: Resolved   Monitor for fevers and other sign of infection.  Afebrile  Cont to monitor 9. Hypotension/HTN: Monitor BP bid   Vitals:   02/03/17 2154 02/04/17 0635  BP: (!) 149/63 138/76  Pulse: 94 91  Resp:  (!) 22  Temp:  97.7 F (36.5 C)  SpO2:  98%   Midodrine d/ced 10/22  Relatively controlled on 10/26 10. ESRD on PD: Schedule PD in evening and early am--unhook by breakfast.  11. Anemia of chronic disease: On aranesp every week.   Hb 9.7 on 10/25  Cont to monitor 12. T2DM insulin dependent: Brittle diabetic who's hard to control in part due to inconsistent intake. Changed to Premier protein with meals.   NPH decreased to to 15u qam   NPH 60u qpm  -follow for response to the above. Sugars high in am and low in late afternoon CBG (last 3)   Recent Labs  02/03/17 2002 02/03/17 2111 02/04/17 0633  GLUCAP 125* 165* 254*     13. Constipation: Bowel reg increased on 10/22, again on 10/24 14. PVD with s/p left femoral pseudoaneurysm repair.  Prevalon boot for RLE with pressure relief measures.  15. Delirium: improved  LOS (Days) 18 A FACE TO FACE EVALUATION WAS PERFORMED  Merci Walthers T 02/04/2017 8:59 AM

## 2017-02-04 NOTE — Progress Notes (Signed)
Occupational Therapy Session Note  Patient Details  Name: Joshua [name] MRN: [id number] Date of Birth: 10-Feb-1941  Today's Date: 02/04/2017 OT Individual Time: 0904-1000 OT Individual Time Calculation (min): 56 min    Short Term Goals: Week 3:  OT Short Term Goal 1 (Week 3): Pt will perform toileting with mod A on BSC (with lateral leans) OT Short Term Goal 2 (Week 3): Family education with wife will be completed for transfer training  OT Short Term Goal 3 (Week 3): Pt will perform transfers with consistant mod A   Skilled Therapeutic Interventions/Progress Updates:    Pt completed bathing and dressing sitting EOB.  He had already completed UB bathing and dressing from wheelchair prior to session, with spouse helping set him up.  Had him transfer squat pivot to the bed from wheelchair with mod assist to start session.  He was able to complete all LB bathing in sitting with lateral leans side to side for washing peri area.  He donned underpants and pants over LEs and then transitioned to supine for rolling side to side to pull them over his hips.  Supervision to complete pulling them over his hips.  Spouse assisted with donning right sock but he was able to donn Darko shoe with min assist.  Mod assist for transfer back to the wheelchair.  Pt's spouse inquired about completing toilet transfers is out in the community, as they would not have a 3:1.  Practiced transfer to the elevated toilet with mod assist scooting and had wife return demonstrate.  Discussed the method of lateral leans side to side to manage clothing as well, which she would likely have to assist with.  Once this was completed pt was positioned at side of bed in wheelchair.  He reported headache so nursing made aware.  Call button in reach and spouse in room as well.   Therapy Documentation Precautions:  Precautions Precautions: Fall Precaution Comments: PD port, VAC left residual limb Required Braces or Orthoses: Other  Brace/Splint Other Brace/Splint: post op shoe Lt  Restrictions Weight Bearing Restrictions: Yes RLE Weight Bearing: Partial weight bearing RLE Partial Weight Bearing Percentage or Pounds: Darco shoe weight bearing on the right heel only LLE Weight Bearing: Non weight bearing LLE Partial Weight Bearing Percentage or Pounds: Darco shoe  Pain: Pain Assessment Pain Assessment: Faces Faces Pain Scale: Hurts a little bit Pain Type: Surgical pain Pain Location: Leg Pain Orientation: Right;Left Pain Descriptors / Indicators: Discomfort;Grimacing Pain Onset: On-going Pain Intervention(s): Repositioned;Emotional support ADL: See Function Navigator for Current Functional Status.   Therapy/Group: Individual Therapy  [name],[name] 02/04/2017, 12:15 PM

## 2017-02-04 NOTE — Progress Notes (Signed)
Subjective: Interval History: has no complaint, feels a lot better with less fluid and less meds.  Objective: Vital signs in last 24 hours: Temp:  [97.7 F (36.5 C)-98.2 F (36.8 C)] 97.7 F (36.5 C) (10/27 0635) Pulse Rate:  [90-94] 91 (10/27 0635) Resp:  [17-22] 22 (10/27 0635) BP: (129-149)/(49-76) 138/76 (10/27 0635) SpO2:  [98 %-100 %] 98 % (10/27 0635) Weight:  [70.4 kg (155 lb 3.3 oz)-73 kg (160 lb 15 oz)] 73 kg (160 lb 15 oz) (10/27 0635) Weight change: -7.6 kg (-16 lb 12.1 oz)  Intake/Output from previous day: 10/26 0701 - 10/27 0700 In: [id number] [P.O.:340] Out: [id number] [Urine:100] Intake/Output this shift: Total I/O In: [id number] [P.O.:60; Other:12499] Out: 12974 [Other:12974]  General appearance: alert, cooperative, no distress, mildly obese and pale Resp: diminished breath sounds bilaterally Cardio: S1, S2 normal and systolic murmur: systolic ejection 2/6, decrescendo at 2nd left intercostal space GI: pos bs, pos FW, PD cath LUQ 1+remities: edema LBKA, 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 and box error  Lab Results:  Recent Labs  02/02/17 0705  WBC 6.9  HGB 9.7*  HCT 31.1*  PLT 445*   BMET:  Recent Labs  02/02/17 0705  NA 132*  K 3.1*  CL 92*  CO2 28  GLUCOSE 187*  BUN 69*  CREATININE 6.25*  CALCIUM 8.4*    Recent Labs  02/02/17 0705  PTH 57   Iron Studies:  Recent Labs  02/02/17 0705  IRON 28*  TIBC 158*    Studies/Results: No results found.  I have reviewed the patient's current medications.  Assessment/Plan: 1 ESRD doing well on PD.  Vol improving,  Cont 2.5.  Mechanics and solute ok 2 HTN lower vol, low dose coreg 3 DM controlled 4 Anemia esa, Fe 5 HPTH vit D 6 PVD  7 Debill per rehab P PD, 2.5, lower bp meds as poss    LOS: 18 days   Jaythen Hamme,Landrum L 02/04/2017,10:43 AM

## 2017-02-04 NOTE — Plan of Care (Signed)
Problem: RH PAIN MANAGEMENT Goal: RH STG PAIN MANAGED AT OR BELOW PT'S PAIN GOAL < 4  Outcome: Not Progressing Rates pain at 8, requests pain meds 2 hours before due.

## 2017-02-04 NOTE — Progress Notes (Signed)
Physical Therapy Session Note  Patient Details  Name: [name] MRN: [id number] Date of Birth: [birth year]  Today's Date: 02/04/2017 PT Individual Time: 1300-1330 PT Individual Time Calculation (min): 30 min  and Today's Date: 02/04/2017 PT Missed Time: 15 Minutes Missed Time Reason: Pain (8/10 pain w/ all active movements)  Short Term Goals: Week 2:  PT Short Term Goal 1 (Week 2): =LTGs due to ELOS  Skilled Therapeutic Interventions/Progress Updates:   Pt in recliner upon arrival and agreeable to therapy, no c/o pain at beginning of session. Worked on sit<>stands using walker this session. Max A sit>stand from recliner w/ verbal and tactile cues for technique. Verbal cues to extend RLE into full knee extension. Pt only able to tolerate 3-5 seconds of standing each bout. Prolonged seated rest in between standing bouts 2/2 pain and fatigue. Pain approaching 8/10 in bilateral LEs, pt grimacing w/ all active LE movements and pain helped w/ reclining of recliner and putting up foot rests. Ended session at this time. Pt reporting he still had an hour until pain meds, ended session in recliner and in care of wife - all needs met.   Therapy Documentation Precautions:  Precautions Precautions: Fall Precaution Comments: PD port, VAC left residual limb Required Braces or Orthoses: Other Brace/Splint Other Brace/Splint: post op shoe Lt  Restrictions Weight Bearing Restrictions: Yes RLE Weight Bearing: Partial weight bearing RLE Partial Weight Bearing Percentage or Pounds: Darco shoe weight bearing on the right heel only LLE Weight Bearing: Non weight bearing LLE Partial Weight Bearing Percentage or Pounds: Darco shoe General: PT Amount of Missed Time (min): 15 Minutes PT Missed Treatment Reason: Pain (8/10 pain w/ all active movements) Pain: Pain Assessment Pain Assessment: 0-10 Pain Score: 8  Faces Pain Scale: Hurts a little bit Pain Type: Surgical pain Pain Location: Leg Pain  Orientation: Right;Left Pain Descriptors / Indicators: Grimacing Pain Onset: With Activity Pain Intervention(s): Repositioned;Emotional support 2nd Pain Site Pain Intervention(s): Repositioned;Elevated extremity  See Function Navigator for Current Functional Status.   Therapy/Group: Individual Therapy  [name] 02/04/2017, 1:46 PM

## 2017-02-04 NOTE — Progress Notes (Signed)
Occupational Therapy Session Note  Patient Details  Name: Joshua [name] MRN: [id number] Date of Birth: Mar 19, 1941  Today's Date: 02/04/2017 OT Individual Time: 1451-1536 OT Individual Time Calculation (min): 45 min    Skilled Therapeutic Interventions/Progress Updates:    Tx focus on pain mgt for increasing participation in life activities.   Pt greeted supine in bed. Reporting 8/10 pain and did not want to get OOB. Already had pain medicine. Pt willing to try mindful visualization exercise. Educated him/spouse on using mindful techniques for holistic pain mgt in conjunction with medication. He was guided in visualization exercise with emphasis on diaphragmatic breathing and progressive muscle relaxation. At end of session, pt reported that his pain was the same, but he was a little more relaxed than before. Discussed using channel 37 (beach waves) on TV to help him become relaxed enough to sleep at night (per pt, this has been an ongoing issue). He was left with all needs within reach at time of departure.   RN made aware of pts persisting pain.   Therapy Documentation Precautions:  Precautions Precautions: Fall Precaution Comments: PD port, VAC left residual limb Required Braces or Orthoses: Other Brace/Splint Other Brace/Splint: post op shoe Lt  Restrictions Weight Bearing Restrictions: Yes RLE Weight Bearing: Partial weight bearing RLE Partial Weight Bearing Percentage or Pounds: Darco shoe weight bearing on the right heel only LLE Weight Bearing: Non weight bearing LLE Partial Weight Bearing Percentage or Pounds: Darco shoe General: General PT Missed Treatment Reason: Pain (8/10 pain w/ all active movements) Pain: Pain Assessment Pain Assessment: 0-10 Pain Score: 8  Faces Pain Scale: Hurts a little bit Pain Type: Acute pain Pain Location: Leg Pain Orientation: Left Pain Descriptors / Indicators: Aching Pain Onset: With Activity Pain Intervention(s): Medication (See  eMAR) 2nd Pain Site Pain Intervention(s): Repositioned;Elevated extremity ADL:     See Function Navigator for Current Functional Status.   Therapy/Group: Individual Therapy  Naelani Lafrance A Aayla Marrocco 02/04/2017, 4:05 PM

## 2017-02-05 ENCOUNTER — Inpatient Hospital Stay (HOSPITAL_COMMUNITY): Payer: Medicare Other

## 2017-02-05 LAB — GLUCOSE, CAPILLARY
GLUCOSE-CAPILLARY: 186 mg/dL — AB (ref 65–99)
GLUCOSE-CAPILLARY: 84 mg/dL (ref 65–99)
GLUCOSE-CAPILLARY: 226 mg/dL — AB (ref 65–99)
Glucose-Capillary: 127 mg/dL — ABNORMAL HIGH (ref 65–99)

## 2017-02-05 MED ORDER — INSULIN NPH (HUMAN) (ISOPHANE) 100 UNIT/ML ~~LOC~~ SUSP
10.0000 [IU] | Freq: Every day | SUBCUTANEOUS | Status: DC
  Administered 2017-02-06 – 2017-02-07 (×2): 10 [IU] via SUBCUTANEOUS

## 2017-02-05 MED ORDER — INSULIN NPH (HUMAN) (ISOPHANE) 100 UNIT/ML ~~LOC~~ SUSP: 15.0000 [IU] | Freq: Every day | SUBCUTANEOUS | Status: DC

## 2017-02-05 MED ORDER — INSULIN NPH (HUMAN) (ISOPHANE) 100 UNIT/ML ~~LOC~~ SUSP
65.0000 [IU] | Freq: Every day | SUBCUTANEOUS | Status: DC
  Administered 2017-02-05 – 2017-02-06 (×2): 65 [IU] via SUBCUTANEOUS

## 2017-02-05 MED ORDER — INSULIN NPH (HUMAN) (ISOPHANE) 100 UNIT/ML ~~LOC~~ SUSP: 65.0000 [IU] | Freq: Every day | SUBCUTANEOUS | Status: DC

## 2017-02-05 NOTE — Progress Notes (Signed)
Subjective: Interval History: has no complaint, cold but on track to go home.  Objective: Vital signs in last 24 hours: Temp:  [98 F (36.7 C)] 98 F (36.7 C) (10/28 0501) Pulse Rate:  [91-97] 91 (10/28 0501) Resp:  [18] 18 (10/28 0501) BP: (123-143)/(56) 143/56 (10/28 0501) SpO2:  [97 %] 97 % (10/28 0501) Weight:  [67 kg (147 lb 11.3 oz)] 67 kg (147 lb 11.3 oz) (10/28 0501) Weight change: -3.4 kg (-7 lb 7.9 oz)  Intake/Output from previous day: 10/27 0701 - 10/28 0700 In: [id number] [P.O.:280] Out: [id number]  Intake/Output this shift: No intake/output data recorded.  General appearance: alert, cooperative, no distress and moderately obese Resp: diminished breath sounds bilaterally Cardio: S1, S2 normal and systolic murmur: systolic ejection 2/6, decrescendo at 2nd left intercostal space GI: pos bs, PD cath LUQ, soft Extremities: LBKA, R foot in boot  Lab Results: No results for input(s): WBC, HGB, HCT, PLT in the last 72 hours. BMET: No results for input(s): NA, K, CL, CO2, GLUCOSE, BUN, CREATININE, CALCIUM in the last 72 hours. No results for input(s): PTH in the last 72 hours. Iron Studies: No results for input(s): IRON, TIBC, TRANSFERRIN, FERRITIN in the last 72 hours.  Studies/Results: No results found.  I have reviewed the patient's current medications.  Assessment/Plan: 1 ESRD PD going well. Slow vol removal, lower meds 2 DM controlled 3 anemia esa, given Fe 4 Obesity 5 PVD 6 Debill P PD, 2.5%, esa, control DM, bp    LOS: 19 days   [name],[name] 02/05/2017,4:01 PM

## 2017-02-05 NOTE — Progress Notes (Signed)
Physical Therapy Session Note  Patient Details  Name: [name] KitchenJames D Holian MRN: [id number] Date of Birth: 1941/01/21  Today's Date: 02/05/2017 PT Individual Time: 1001-1058, 1400-1430  PT Individual Time Calculation (min): 57 min , 30 min   Short Term Goals: Week 2:  PT Short Term Goal 1 (Week 2): =LTGs due to ELOS  Skilled Therapeutic Interventions/Progress Updates:    Session 1: Pt seated in w/c upon PT arrival, agreeable to therapy tx and reports pain 5/10 this session. Wife present for entire session and eager to practice transfers with the pt. Pt propelled w/c x 50 ft with supervision using B UEs, increased time to complete secondary to fatigue and slow speed. Session focused on transfer training with pt and wife. Wife reports that their bed is 28 inches but they are trying to get it lowered, requesting to practice 28 inch bed transfer just in case she is unable to get it lowered by Tuesday. Pt performed x 2 lateral scoot transfers w/c<>bed in rehab apartment with physical assist from wife, therapist provided verbal cues for techniques. Wife states that she has a transport chair for going out to doctors appointments but the arm rests do not move, therapist demonstrated how to place/perform slideboard transfer without moving armrests. Pt performed slideboard transfer from w/c<>mat with physical assist from wife, verbal cues from therapist for technique. Pt left seated in w/c in room with wife present and needs in reach.   Session 2: Pt seated in w/c upon PT arrival, agreeable to therapy tx and reports pain 6/10 B LEs. Pt propelled w/c 2 x 50 ft bouts with rest breaks between using B UEs, increased time to complete, working on endurance. Pt reports feeling very fatigued this afternoon, agreeable to seated therex. Pt performed B LE strengthening: 2 x 10 LAQ, 2 x 10 hip flexion, 2 x 10 adductor ball squeezes, 2 x 10 hip abduction with level 1 TB. Pt performed UE strengthening exercises with 2# dowel: 2  x 10 shoulder flexion, 2 x 10 bicep curls and 2 x 10 chest press. Pt left seated in w/c at end of session with needs in reach and wife present.   Therapy Documentation Precautions:  Precautions Precautions: Fall Precaution Comments: PD port, VAC left residual limb Required Braces or Orthoses: Other Brace/Splint Other Brace/Splint: post op shoe Lt  Restrictions Weight Bearing Restrictions: Yes RLE Weight Bearing: Partial weight bearing RLE Partial Weight Bearing Percentage or Pounds: Darco shoe weight bearing on the right heel only LLE Weight Bearing: Non weight bearing LLE Partial Weight Bearing Percentage or Pounds: Darco shoe   See Function Navigator for Current Functional Status.   Therapy/Group: Individual Therapy  Cresenciano GenreEmily van Schagen, PT, DPT 02/05/2017, 10:17 AM

## 2017-02-05 NOTE — Progress Notes (Signed)
Occupational Therapy Session Note  Patient Details  Name: Joshua [name] MRN: [id number] Date of Birth: 1940/08/27  Today's Date: 02/05/2017 OT Individual Time: [id number] OT Individual Time Calculation (min): 56 min    Skilled Therapeutic Interventions/Progress Updates:    1;1. Pt EOB with wife assisting b/d upon arrival. Focus of session on d/c planning, transfer training and endurance. Wife provides MOD A for squat pivot transfers EOB<>w/c without VC for technique. Pt propels w/c part way to tx gym with min Vc for steering. Pt and wife complete 2 squat pivot transfers at 22" height as that will be bed level once bed is off bed frame. Wife expressing she would like to practice 5427" transfer in case family friend unable to adjust bed frame. Pt and wife complete squat pivot transfer w/c<>edge of regular bed in ADL apartment with VC from OT for pt foot placement and body mechanicsof wife/bending knees prior to transfer to increase pushing through legs. OT provides touching A througout high bed transfers for safety. Recommend continued practice and quicker squats to decrease risk of being stuck EOB/sliding off. Exited session with pt seatedin w/c with wife present and call light in reach.  Therapy Documentation Precautions:  Precautions Precautions: Fall Precaution Comments: PD port, VAC left residual limb Required Braces or Orthoses: Other Brace/Splint Other Brace/Splint: post op shoe Lt  Restrictions Weight Bearing Restrictions: Yes RLE Weight Bearing: Partial weight bearing RLE Partial Weight Bearing Percentage or Pounds: Darco shoe weight bearing on the right heel only LLE Weight Bearing: Non weight bearing LLE Partial Weight Bearing Percentage or Pounds: Darco shoe General:  See Function Navigator for Current Functional Status.   Therapy/Group: Individual Therapy  Shon HaleStephanie M Denim Start 02/05/2017, 6:54 AM

## 2017-02-05 NOTE — Plan of Care (Signed)
Problem: RH PAIN MANAGEMENT Goal: RH STG PAIN MANAGED AT OR BELOW PT'S PAIN GOAL < 4  Outcome: Not Progressing Pain rated at 7-8

## 2017-02-05 NOTE — Progress Notes (Addendum)
[place] PHYSICAL MEDICINE & REHABILITATION     PROGRESS NOTE  Subjective/Complaints:  Wife at bedside. Slept quite well. No new problems. Intermittent stump,right leg pain  ROS: pt denies nausea, vomiting, diarrhea, cough, shortness of breath or chest pain   Objective: Vital Signs: Blood pressure (!) 143/56, pulse 91, temperature 98 F (36.7 C), temperature source Axillary, resp. rate 18, height 5\' 9"  (1.753 m), weight 67 kg (147 lb 11.3 oz), SpO2 97 %. No results found. No results for input(s): WBC, HGB, HCT, PLT in the last 72 hours. No results for input(s): NA, K, CL, GLUCOSE, BUN, CREATININE, CALCIUM in the last 72 hours.  Invalid input(s): CO CBG (last 3)   Recent Labs  02/04/17 2102 02/04/17 2126 02/05/17 0645  GLUCAP 228* 214* 186*    Wt Readings from Last 3 Encounters:  02/05/17 67 kg (147 lb 11.3 oz)  01/17/17 86.7 kg (191 lb 2.2 oz)  01/05/17 87.9 kg (193 lb 12.6 oz)    Physical Exam:  BP (!) 143/56 (BP Location: Right Arm)   Pulse 91   Temp 98 F (36.7 C) (Axillary)   Resp 18   Ht 5\' 9"  (1.753 m)   Wt 67 kg (147 lb 11.3 oz)   SpO2 97%   BMI 21.81 kg/m  Constitutional: He appears well-nourished. NAD. Obese. HENT: Normocephalic. Areas of abrasion healing, except nose.  Eyes: EOMI. No discharge.  Cardiovascular RRR without murmur. No JVD   . Respiratory: CTA Bilaterally without wheezes or rales. Normal effort  GI: Soft. Bowel sounds are normal.   Musculoskeletal: He exhibits edemaand tenderness Left BKA dressed Right 5th digit ampuation Neurological: He is alert.  Motor: 4+/5 grossly throughout (stable) Skin:  BKA incision with dressing c/d/i.  Right 5th dig amp with dressing Right groin with ulcer and induration, not examined today Psychiatric: His speech is normal. He is flat   Assessment/Plan: 1. Functional deficits secondary to left BKA which require 3+ hours per day of interdisciplinary therapy in a comprehensive inpatient rehab  setting. Physiatrist is providing close team supervision and 24 hour management of active medical problems listed below. Physiatrist and rehab team continue to assess barriers to discharge/monitor patient progress toward functional and medical goals.  Function:  Bathing Bathing position   Position: Bed  Bathing parts Body parts bathed by patient: Right arm, Left arm, Chest, Abdomen, Front perineal area, Buttocks, Right upper leg, Left upper leg, Right lower leg Body parts bathed by helper: Right lower leg, Back  Bathing assist Assist Level: Set up   Set up : To obtain items  Upper Body Dressing/Undressing Upper body dressing   What is the patient wearing?: Pull over shirt/dress     Pull over shirt/dress - Perfomed by patient: Thread/unthread right sleeve, Thread/unthread left sleeve, Put head through opening, Pull shirt over trunk Pull over shirt/dress - Perfomed by helper: Pull shirt over trunk        Upper body assist Assist Level: Set up      Lower Body Dressing/Undressing Lower body dressing   What is the patient wearing?: Pants, Socks, Shoes Underwear - Performed by patient: Thread/unthread left underwear leg, Pull underwear up/down, Thread/unthread right underwear leg Underwear - Performed by helper: Thread/unthread right underwear leg Pants- Performed by patient: Thread/unthread right pants leg, Thread/unthread left pants leg, Pull pants up/down Pants- Performed by helper: Thread/unthread left pants leg   Non-skid slipper socks- Performed by helper: Don/doff right sock   Socks - Performed by helper: Don/doff right sock Shoes -  Performed by patient: Don/doff right shoe Shoes - Performed by helper: Don/doff right shoe          Lower body assist Assist for lower body dressing: Set up      Toileting Toileting Toileting activity did not occur: No continent bowel/bladder event Toileting steps completed by patient: Adjust clothing prior to toileting, Performs perineal  hygiene, Adjust clothing after toileting Toileting steps completed by helper: Adjust clothing prior to toileting, Performs perineal hygiene, Adjust clothing after toileting Toileting Assistive Devices: Grab bar or rail  Toileting assist Assist level: Touching or steadying assistance (Pt.75%)   Transfers Chair/bed transfer   Chair/bed transfer method: Lateral scoot Chair/bed transfer assist level: Moderate assist (Pt 50 - 74%/lift or lower) Chair/bed transfer assistive device: Armrests Mechanical lift: Stedy   Locomotion Ambulation Ambulation activity did not occur: Safety/medical concerns         Wheelchair   Type: Manual Max wheelchair distance: 50 Assist Level: Supervision or verbal cues  Cognition Comprehension Comprehension assist level: Understands basic 90% of the time/cues < 10% of the time  Expression Expression assist level: Expresses basic 90% of the time/requires cueing < 10% of the time.  Social Interaction Social Interaction assist level: Interacts appropriately 75 - 89% of the time - Needs redirection for appropriate language or to initiate interaction.  Problem Solving Problem solving assist level: Solves basic 90% of the time/requires cueing < 10% of the time  Memory Memory assist level: Recognizes or recalls 90% of the time/requires cueing < 10% of the time    Medical Problem List and Plan: 1. Functional deficitssecondary to left BKA  Cont CIR, 15/7  May weight bear through the heel on the right side 2. DVT Prophylaxis/Anticoagulation: Pharmaceutical: Heparin 3. Pain Management: Tylenol or Celebrex prn. Unable to tolerate muscle relaxers or pain medications due to SE of tremors, confusion and hallucinations.  4. Mood: LCSW to follow for evaluation and support.  5. Neuropsych: This patient is not fullycapable of making decisions on his own behalf. 6. Skin/Wound Care: Routine pressure relief measures. Prevalon boot for right foot. Monitor daily for intake to  promote healing. Protein supplement and vitamins to promote wound healing.   Per vascular, cont to monitor wounds--stable at present 7. Fluids/Electrolytes/Nutrition: Monitor I/O. Lytes/CBC per PD protocol.   Requires encouragement for nutrition  Remeron 15 mg started on 10/17, increased on 10/23 8. Leucocytosis: Resolved   Monitor for fevers and other sign of infection.  Afebrile  Cont to monitor 9. Hypotension/HTN: Monitor BP bid   Vitals:   02/04/17 2130 02/05/17 0501  BP: (!) 123/56 (!) 143/56  Pulse: 97 91  Resp:  18  Temp:  98 F (36.7 C)  SpO2:  97%   Midodrine d/ced 10/22  Relatively controlled on 10/26 10. ESRD on PD: Schedule PD in evening and early am--unhook by breakfast.  11. Anemia of chronic disease: On aranesp every week.   Hb 9.7 on 10/25  Cont to monitor 12. T2DM insulin dependent: Brittle diabetic who's hard to control in part due to inconsistent intake. Changed to Premier protein with meals.   NPH decreased to to 15u qam   NPH 60u qpm  -  Sugars high in am and low in late afternoon---increase pm NPH to 65u tonight 10/28, decr am NPH to 10u CBG (last 3)   Recent Labs  02/04/17 2102 02/04/17 2126 02/05/17 0645  GLUCAP 228* 214* 186*     13. Constipation: Bowel reg increased on 10/22, again on 10/24---+BM on  10/27 14. PVD with s/p left femoral pseudoaneurysm repair.   Prevalon boot for RLE with pressure relief measures.  15. Delirium: improved  LOS (Days) 19 A FACE TO FACE EVALUATION WAS PERFORMED  [name] T 02/05/2017 9:20 AM

## 2017-02-06 ENCOUNTER — Inpatient Hospital Stay (HOSPITAL_COMMUNITY): Payer: Medicare Other

## 2017-02-06 ENCOUNTER — Inpatient Hospital Stay (HOSPITAL_COMMUNITY): Payer: Medicare Other | Admitting: Physical Therapy

## 2017-02-06 ENCOUNTER — Inpatient Hospital Stay (HOSPITAL_COMMUNITY): Payer: Medicare Other | Admitting: Occupational Therapy

## 2017-02-06 DIAGNOSIS — T8130XA Disruption of wound, unspecified, initial encounter: Secondary | ICD-10-CM | POA: Insufficient documentation

## 2017-02-06 DIAGNOSIS — T8131XA Disruption of external operation (surgical) wound, not elsewhere classified, initial encounter: Secondary | ICD-10-CM

## 2017-02-06 LAB — OCCULT BLOOD X 1 CARD TO LAB, STOOL: FECAL OCCULT BLD: POSITIVE — AB

## 2017-02-06 LAB — GLUCOSE, CAPILLARY
GLUCOSE-CAPILLARY: 202 mg/dL — AB (ref 65–99)
Glucose-Capillary: 112 mg/dL — ABNORMAL HIGH (ref 65–99)
Glucose-Capillary: 59 mg/dL — ABNORMAL LOW (ref 65–99)
Glucose-Capillary: 62 mg/dL — ABNORMAL LOW (ref 65–99)
Glucose-Capillary: 87 mg/dL (ref 65–99)
Glucose-Capillary: 132 mg/dL — ABNORMAL HIGH (ref 65–99)
Glucose-Capillary: 226 mg/dL — ABNORMAL HIGH (ref 65–99)

## 2017-02-06 LAB — CBC
HEMATOCRIT: 32.5 % — AB (ref 39.0–52.0)
HEMOGLOBIN: 10.1 g/dL — AB (ref 13.0–17.0)
MCH: 30.2 pg (ref 26.0–34.0)
MCHC: 31.1 g/dL (ref 30.0–36.0)
MCV: 97.3 fL (ref 78.0–100.0)
Platelets: 406 10*3/uL — ABNORMAL HIGH (ref 150–400)
RBC: 3.34 MIL/uL — ABNORMAL LOW (ref 4.22–5.81)
RDW: 16.5 % — ABNORMAL HIGH (ref 11.5–15.5)
WBC: 8.3 10*3/uL (ref 4.0–10.5)

## 2017-02-06 MED ORDER — CEPHALEXIN 250 MG PO CAPS
500.0000 mg | ORAL_CAPSULE | Freq: Two times a day (BID) | ORAL | Status: DC
  Administered 2017-02-06 – 2017-02-07 (×2): 500 mg via ORAL
  Filled 2017-02-06 (×2): qty 2

## 2017-02-06 MED ORDER — DOXYCYCLINE HYCLATE 100 MG PO TABS
100.0000 mg | ORAL_TABLET | Freq: Two times a day (BID) | ORAL | Status: DC
  Administered 2017-02-06: 100 mg via ORAL
  Filled 2017-02-06 (×2): qty 1

## 2017-02-06 NOTE — Significant Event (Signed)
Hypoglycemic Event  CBG: 59  Treatment: 15 GM carbohydrate snack  Symptoms: None  Follow-up CBG: Time:1301 CBG Result:62  Possible Reasons for Event: Inadequate meal intake  Comments/MD notified:no symptoms    Joshua SchultzeLilja, [name]

## 2017-02-06 NOTE — Progress Notes (Signed)
Occupational Therapy Session Note  Patient Details  Name: Joshua KitchenJames D Gutierrez MRN: [id number] Date of Birth: [birth year]  Today's Date: 02/06/2017 OT Individual Time: 9562-13081030-1125 OT Individual Time Calculation (min): 55 min    Short Term Goals: Week 3:  OT Short Term Goal 1 (Week 3): Pt will perform toileting with mod A on BSC (with lateral leans) OT Short Term Goal 2 (Week 3): Family education with wife will be completed for transfer training  OT Short Term Goal 3 (Week 3): Pt will perform transfers with consistant mod A   Skilled Therapeutic Interventions/Progress Updates:    Treatment session with focus on completing family education with various transfers.  Pt received upright in w/c, pt and wife reporting completing bathing/dressing prior to first therapy session at overall Min assist level from bed.  Completed toilet transfer to drop arm commode with recommendation for use of slide board when pt increasingly fatigued or uneven surface as pt preferring squat pivot/lateral scoot.  Completed bed mobility and transfer standard bed <> w/c with use of slide board.  Pt's wife providing mod assist due to incline with transfer into standard bed in ADL apt (to simulate home setup).  Pt able to complete sit <> supine with supervision.  Returned to room and requesting to transfer back to hospital bed due to fatigue.  Again reiterated recommendation for slide board with increased fatigue/pain.  Pt's wife reports no further questions and increased confidence in transfers and how to assist pt to increase independence.  Pt reports no further questions/concerns.   Therapy Documentation Precautions:  Precautions Precautions: Fall Precaution Comments: PD port, VAC left residual limb Required Braces or Orthoses: Other Brace/Splint Other Brace/Splint: post op shoe Lt  Restrictions Weight Bearing Restrictions: Yes RLE Weight Bearing: Partial weight bearing RLE Partial Weight Bearing Percentage or Pounds: Darco  shoe weight bearing on the right heel only LLE Weight Bearing: Non weight bearing LLE Partial Weight Bearing Percentage or Pounds: Darco shoe Pain: Pain Assessment Pain Assessment: 0-10 Pain Score: 7  Pain Type: Acute pain;Neuropathic pain Pain Location: Leg Pain Orientation: Right;Left Pain Descriptors / Indicators: Aching Pain Frequency: Intermittent Pain Onset: With Activity Pain Intervention(s): Medication (See eMAR)  See Function Navigator for Current Functional Status.   Therapy/Group: Individual Therapy  Rosalio LoudHOXIE, Jalasia Eskridge 02/06/2017, 3:41 PM

## 2017-02-06 NOTE — Progress Notes (Signed)
Physical Therapy Note  Patient Details  Name: [name] KitchenJames D Rocha MRN: [id number] Date of Birth: Jul 25, 1940 Today's Date: 02/06/2017  1415-1445, 30 min  Pain: none per pt  Tracey, RT observed wife transferring pt safely bed> w/c squat pivot.  Strengthening exs in sitting in w/c: Kinetron from w/c at level 50 using RLE with resistance from PT on L foot plate, x 15 cycles x 20 cycles. 10 x 1 L short arc quad knee ext with 3 second hold at end range; L quad set with lower leg supported to increase hamstring stretch.  Pt's wife pushed pt in w/c back to room.  See function navigator for current status.  Arush Gatliff 02/06/2017, 2:36 PM

## 2017-02-06 NOTE — Progress Notes (Signed)
At shift change, patient's wife stopped nurses to alert of patient bleeding from the rectum. Patient was on the bedside commode. No history of hemorrhoids, none noted on inspection. No c/o pain to the area. Patient had formed feces with spots of frank blood. Physician on call was notified. A verbal order was given for stool occult blood specimen to be taken & for a stat CBC. Orders transcribed as ordered. Will continue to monitor.

## 2017-02-06 NOTE — Progress Notes (Signed)
  [name] Progress Note   Assessment/ Plan:   Dialysis Orders:  Center: [place] [facility]. 6 exchanges/day one mid-day exchange (occurs around 9am).  2.5 liters of 2.5% per [facility] 2 liters of 2.5% for last fill and mid day drain. Pt says EDW "about 202" (pre admission) but now is around 148 lbs or 67kg. Dr. Denny PeonBaveja  1 ESRD PD going well. Use 2.5%. 2 DM controlled 3 anemia esa, given Fe 4 Obesity 5 PVD- s/p BKA 6 Debill- in CIR, for d/c tomorrow     Subjective:    L leg hurting.  Per wife, d/c tomorrow.    Objective:   BP (!) 136/51 (BP Location: Right Arm)   Pulse 85   Temp 98.3 F (36.8 C) (Oral)   Resp 18   Ht 5\' 9"  (1.753 m)   Wt 72 kg (158 lb 11.7 oz)   SpO2 97%   BMI 23.44 kg/m   Physical Exam: General appearance: alert, cooperative, lying in bed Resp: diminished breath sounds bilaterally Cardio: S1, S2 normal and systolic murmur. GI: pos bs, PD cath LUQ, soft Extremities: LBKA with ACE wrap on, R foot in protective boot  Labs: BMET  Recent Labs Lab 02/02/17 0705  NA 132*  K 3.1*  CL 92*  CO2 28  GLUCOSE 187*  BUN 69*  CREATININE 6.25*  CALCIUM 8.4*  PHOS 3.9   CBC  Recent Labs Lab 01/31/17 1440 02/02/17 0705  WBC 9.0 6.9  HGB 10.2* 9.7*  HCT 32.1* 31.1*  MCV 97.0 97.2  PLT 462* 445*    @IMGRELPRIORS @ Medications:    . aspirin  81 mg Oral Daily  . atorvastatin  80 mg Oral q1800  . calcium acetate  667 mg Oral TID WC  . carvedilol  6.25 mg Oral QHS  . clopidogrel  75 mg Oral Q breakfast  . darbepoetin (ARANESP) injection - NON-DIALYSIS  200 mcg Subcutaneous Q Thu-1800  . docusate sodium  100 mg Oral BID  . doxycycline  100 mg Oral Q12H  . feeding supplement (PRO-STAT SUGAR FREE 64)  30 mL Oral BID  . gentamicin cream  1 application Topical BID  . gentamicin cream  1 application Topical Daily  . heparin subcutaneous  5,000 Units Subcutaneous Q8H  . insulin aspart  0-5 Units Subcutaneous QHS  .  insulin aspart  0-9 Units Subcutaneous TID WC  . insulin NPH Human  10 Units Subcutaneous QAC breakfast   And  . insulin NPH Human  65 Units Subcutaneous QHS  . mirtazapine  30 mg Oral QHS  . multivitamin  1 tablet Oral QHS  . protein supplement shake  2 oz Oral TID WC  . senna-docusate  2 tablet Oral BID  . Vitamin D (Ergocalciferol)  50,000 Units Oral Q30 days     Bufford ButtnerElizabeth Elson Ulbrich MD [facility] pgr [phone] 02/06/2017, 2:10 PM

## 2017-02-06 NOTE — Progress Notes (Signed)
Occupational Therapy Discharge Summary  Patient Details  Name: [name] MRN: [id number] Date of Birth: Sep 03, 1940   Patient has met 6 of 6 long term goals due to improved activity tolerance, postural control, ability to compensate for deficits and improved awareness.  Patient to discharge at [facility] Assist level, mod assist toileting.  Patient's care partner is independent to provide the necessary physical and cognitive assistance at discharge.  Patient's wife has been present for majority of sessions and has completed multiple sessions of hands on education regarding self-care retraining and various levels of transfers, demonstrating safety and competence.  Reasons goals not met: N/A  Recommendation:  Patient will benefit from ongoing skilled OT services in home health setting to continue to advance functional skills in the area of BADL and Reduce care partner burden.  Equipment: bariatric drop arm commode, tub bench  Reasons for discharge: treatment goals met and discharge from hospital  Patient/family agrees with progress made and goals achieved: Yes  OT Discharge Precautions/Restrictions  Precautions Precautions: Fall Required Braces or Orthoses: Other Brace/Splint Other Brace/Splint: post op shoe Rt Restrictions Weight Bearing Restrictions: Yes RLE Weight Bearing: Partial weight bearing RLE Partial Weight Bearing Percentage or Pounds: Darco shoe weight bearing on the right heel only LLE Weight Bearing: Non weight bearing Pain Pain Assessment Pain Assessment: 0-10 Pain Score: 7  Pain Type: Acute pain;Neuropathic pain Pain Location: Leg Pain Orientation: Right;Left Pain Descriptors / Indicators: Aching Pain Frequency: Intermittent Pain Onset: With Activity Pain Intervention(s): Medication (See eMAR) ADL  See Function Navigator Vision Baseline Vision/History: Wears glasses Wears Glasses: At all times Patient Visual Report: No change from baseline Vision  Assessment?: No apparent visual deficits Perception  Perception: Within Functional Limits Praxis Praxis: Intact Cognition Overall Cognitive Status: Within Functional Limits for tasks assessed Arousal/Alertness: Awake/alert Orientation Level: Oriented X4 Attention: Sustained Sustained Attention: Appears intact Memory: Appears intact Awareness: Appears intact Problem Solving: Appears intact Safety/Judgment: Appears intact Sensation Sensation Light Touch: Impaired Detail Light Touch Impaired Details: Impaired RLE;Impaired LLE Hot/Cold: Appears Intact Proprioception: Appears Intact Coordination Gross Motor Movements are Fluid and Coordinated: Yes Fine Motor Movements are Fluid and Coordinated: Yes Extremity/Trunk Assessment RUE Assessment RUE Assessment: Exceptions to WFL (ROM WFL, generalized weakness) LUE Assessment LUE Assessment: Exceptions to WFL (ROM WFL, generalized weakness)   See Function Navigator for Current Functional Status.  Simonne Come 02/06/2017, 3:48 PM

## 2017-02-06 NOTE — Progress Notes (Signed)
Physical Therapy Discharge Summary  Patient Details  Name: [name] MRN: [id number] Date of Birth: 01/21/1941  Today's Date: 02/06/2017 PT Individual Time: 0900-0955 PT Individual Time Calculation (min): 55 min   Pt and wife performed bed <> wc transfers, furniture transfers and car transfer all with pt's wife performing mod A with sliding board for transfers.  Soft tissue mobilization and trigger point release to Lt hamstring to improve Lt knee extension.  Pt performed w/c mobility x 50' with supervision.  Pt and wife educated on energy conservation and recommendations to have 2 people at all times for car transfer, both verbalize understanding and state they feel ready for d/c home tomorrow.  Patient has met 7 of 7 long term goals due to improved activity tolerance, improved balance, improved postural control, increased strength and ability to compensate for deficits.  Patient to discharge at a wheelchair level [place].   Patient's care partner is independent to provide the necessary physical assistance at discharge.  Reasons goals not met: n/a  Recommendation:  Patient will benefit from ongoing skilled PT services in home health setting to continue to advance safe functional mobility, address ongoing impairments in strength, balance, activity tolerance, and minimize fall risk.  Equipment: power w/c, sliding board  Reasons for discharge: treatment goals met and discharge from hospital  Patient/family agrees with progress made and goals achieved: Yes  PT Discharge Precautions/Restrictions Precautions Precautions: Fall Restrictions Weight Bearing Restrictions: Yes RLE Weight Bearing: Partial weight bearing RLE Partial Weight Bearing Percentage or Pounds: Darco shoe weight bearing on the right heel only LLE Weight Bearing: Non weight bearing Pain Pain Assessment Pain Assessment: No/denies pain   Cognition Overall Cognitive Status: Within Functional Limits for tasks  assessed Orientation Level: Oriented X4 Sensation Sensation Light Touch: Impaired Detail Light Touch Impaired Details: Impaired RLE;Impaired LLE Proprioception: Appears Intact Coordination Gross Motor Movements are Fluid and Coordinated: Yes Fine Motor Movements are Fluid and Coordinated: Yes Motor  Motor Motor - Discharge Observations: generalized weakness   Trunk/Postural Assessment  Cervical Assessment Cervical Assessment:  (fwd head) Thoracic Assessment Thoracic Assessment:  (mild kyphosis) Lumbar Assessment Lumbar Assessment:  (posterior pelvic tilt) Postural Control Postural Control: Deficits on evaluation Righting Reactions: improving  Balance Static Sitting Balance Static Sitting - Level of Assistance: 7: Independent Dynamic Sitting Balance Dynamic Sitting - Level of Assistance: 6: Modified independent (Device/Increase time) Static Standing Balance Static Standing - Level of Assistance: 2: Max assist Extremity Assessment      RLE Strength RLE Overall Strength Comments: Grossly 4-/5 with knee extension, knee flexion, dorsiflexion/plantarflexion.  LLE Strength LLE Overall Strength Comments: Hip flexion 3-/5, hip abduction 3/5, adduction 3/5   See Function Navigator for Current Functional Status.  DONAWERTH,KAREN 02/06/2017, 9:53 AM

## 2017-02-06 NOTE — Progress Notes (Signed)
WTA RN checked R groin wound. Estimated depth 2 cm, hard around margins, scant drainage. WTA RN spoke to PA, PA requesting vascular consult. WTA RN recommendation for iodoform packing. Will continue to monitor.

## 2017-02-06 NOTE — Significant Event (Signed)
Hypoglycemic Event  CBG: 62  Treatment: 15 GM carbohydrate snack  Symptoms: None  Follow-up CBG: Time:1301 CBG Result:87  Possible Reasons for Event: Inadequate meal intake  Comments/MD notified:     Bluford KaufmannLilja, Kerrianne Jeng H

## 2017-02-06 NOTE — Progress Notes (Signed)
[name]     PROGRESS NOTE  Subjective/Complaints:  Pt seen laying in bed this AM.  He slept well overnight overnight and had a good weekend, per wife.  He has questions about going home.  Wounds evaluated with nursing.   ROS: Denies nausea, vomiting, diarrhea, shortness of breath or chest pain    Objective: Vital Signs: Blood pressure (!) 136/51, pulse 85, temperature 98.3 F (36.8 C), temperature source Oral, resp. rate 18, height 5\' 9"  (1.753 m), weight 72 kg (158 lb 11.7 oz), SpO2 97 %. No results found. No results for input(s): WBC, HGB, HCT, PLT in the last 72 hours. No results for input(s): NA, K, CL, GLUCOSE, BUN, CREATININE, CALCIUM in the last 72 hours.  Invalid input(s): CO CBG (last 3)   Recent Labs  02/05/17 2108 02/06/17 0636 02/06/17 0934  GLUCAP 226* 202* 112*    Wt Readings from Last 3 Encounters:  02/06/17 72 kg (158 lb 11.7 oz)  01/17/17 86.7 kg (191 lb 2.2 oz)  01/05/17 87.9 kg (193 lb 12.6 oz)    Physical Exam:  BP (!) 136/51 (BP Location: Right Arm)   Pulse 85   Temp 98.3 F (36.8 C) (Oral)   Resp 18   Ht 5\' 9"  (1.753 m)   Wt 72 kg (158 lb 11.7 oz)   SpO2 97%   BMI 23.44 kg/m  Constitutional: He appears well-nourished. NAD. Obese. HENT: Normocephalic. Areas of abrasion healing, except nose.  Eyes: EOMI. No discharge.  Cardiovascular: RRR. No JVD. Respiratory: CTA Bilaterally. Normal effort. GI: Soft. Bowel sounds are normal.   Musculoskeletal: He exhibits edemaand tenderness Left BKA dressed Right 5th digit ampuation Neurological: He is alert.  Motor: 4+/5 grossly throughout (stable) Skin:  BKA incision with medial dehiscence.  Right 5th dig amp with dehiscence Psychiatric: His speech is normal. He is flat   Assessment/Plan: 1. Functional deficits secondary to left BKA which require 3+ hours per day of interdisciplinary therapy in a comprehensive inpatient rehab setting. Physiatrist is providing  close team supervision and 24 hour management of active medical problems listed below. Physiatrist and rehab team continue to assess barriers to discharge/monitor patient progress toward functional and medical goals.  Function:  Bathing Bathing position   Position: Bed  Bathing parts Body parts bathed by patient: Right arm, Left arm, Chest, Abdomen, Front perineal area, Buttocks, Right upper leg, Left upper leg, Right lower leg Body parts bathed by helper: Right lower leg, Back  Bathing assist Assist Level: Set up   Set up : To obtain items  Upper Body Dressing/Undressing Upper body dressing   What is the patient wearing?: Pull over shirt/dress     Pull over shirt/dress - Perfomed by patient: Thread/unthread right sleeve, Thread/unthread left sleeve, Put head through opening, Pull shirt over trunk Pull over shirt/dress - Perfomed by helper: Pull shirt over trunk        Upper body assist Assist Level: Set up      Lower Body Dressing/Undressing Lower body dressing   What is the patient wearing?: Pants, Socks, Shoes Underwear - Performed by patient: Thread/unthread left underwear leg, Pull underwear up/down, Thread/unthread right underwear leg Underwear - Performed by helper: Thread/unthread right underwear leg Pants- Performed by patient: Thread/unthread right pants leg, Thread/unthread left pants leg, Pull pants up/down Pants- Performed by helper: Thread/unthread left pants leg   Non-skid slipper socks- Performed by helper: Don/doff right sock   Socks - Performed by helper: Don/doff right sock Shoes -  Performed by patient: Don/doff right shoe Shoes - Performed by helper: Don/doff right shoe          Lower body assist Assist for lower body dressing: Set up      Toileting Toileting Toileting activity did not occur: No continent bowel/bladder event Toileting steps completed by patient: Adjust clothing prior to toileting, Performs perineal hygiene, Adjust clothing after  toileting Toileting steps completed by helper: Adjust clothing prior to toileting, Performs perineal hygiene, Adjust clothing after toileting Toileting Assistive Devices: Grab bar or rail  Toileting assist Assist level: Touching or steadying assistance (Pt.75%)   Transfers Chair/bed transfer   Chair/bed transfer method: Lateral scoot Chair/bed transfer assist level: Moderate assist (Pt 50 - 74%/lift or lower) Chair/bed transfer assistive device: Armrests Mechanical lift: Stedy   Locomotion Ambulation Ambulation activity did not occur: Safety/medical concerns         Wheelchair   Type: Manual Max wheelchair distance: 50 Assist Level: Supervision or verbal cues  Cognition Comprehension Comprehension assist level: Understands basic 90% of the time/cues < 10% of the time  Expression Expression assist level: Expresses basic 90% of the time/requires cueing < 10% of the time.  Social Interaction Social Interaction assist level: Interacts appropriately 90% of the time - Needs monitoring or encouragement for participation or interaction.  Problem Solving Problem solving assist level: Solves basic 90% of the time/requires cueing < 10% of the time  Memory Memory assist level: Recognizes or recalls 90% of the time/requires cueing < 10% of the time    Medical Problem List and Plan: 1. Functional deficitssecondary to left BKA  Cont CIR, 15/7  May weight bear through the heel on the right side 2. DVT Prophylaxis/Anticoagulation: Pharmaceutical: Heparin 3. Pain Management: Tylenol or Celebrex prn. Unable to tolerate muscle relaxers or pain medications due to SE of tremors, confusion and hallucinations.  4. Mood: LCSW to follow for evaluation and support.  5. Neuropsych: This patient is not fullycapable of making decisions on his own behalf. 6. Skin/Wound Care: Routine pressure relief measures. Prevalon boot for right foot. Monitor daily for intake to promote healing. Protein supplement and  vitamins to promote wound healing.   Per vascular, cont to monitor wounds, B/l LE wounds have dehisced, will speak to Vasc regarding reeval.  Doxy started on 10/29 7. Fluids/Electrolytes/Nutrition: Monitor I/O. Lytes/CBC per PD protocol.   Requires encouragement for nutrition  Remeron 15 mg started on 10/17, increased on 10/23 8. Leucocytosis: Resolved   Monitor for fevers and other sign of infection.  Afebrile  Cont to monitor 9. Hypotension/HTN: Monitor BP bid   Vitals:   02/06/17 0612 02/06/17 0810  BP: (!) 148/68 (!) 136/51  Pulse: 100 85  Resp: 18 18  Temp: 97.9 F (36.6 C) 98.3 F (36.8 C)  SpO2: 97%    Midodrine d/ced 10/22  Relatively controlled on 10/29 10. ESRD on PD: Schedule PD in evening and early am--unhook by breakfast.  11. Anemia of chronic disease: On aranesp every week.   Hb 9.7 on 10/25  Cont to monitor 12. T2DM insulin dependent: Brittle diabetic who's hard to control in part due to inconsistent intake. Changed to Premier protein with meals.   NPH decreased to to 10u qam   NPH 65u qpm CBG (last 3)   Recent Labs  02/05/17 2108 02/06/17 0636 02/06/17 0934  GLUCAP 226* 202* 112*   Remains labile, will cont to monitor with initiation with abx 13. Constipation: Bowel reg increased on 10/22, again on 10/24  Improving  14. PVD with s/p left femoral pseudoaneurysm repair.   Prevalon boot for RLE with pressure relief measures.  15. Delirium: improved  LOS (Days) 20 A FACE TO FACE EVALUATION WAS PERFORMED  Ankit Karis Juba 02/06/2017 10:37 AM

## 2017-02-06 NOTE — Progress Notes (Signed)
  Progress Note    02/06/2017 3:54 PM 17 Days Post-Op  Subjective:  No complaints;  Excited about going home tomorrow  afebrile  Vitals:   02/06/17 0612 02/06/17 0810  BP: (!) 148/68 (!) 136/51  Pulse: 100 85  Resp: 18 18  Temp: 97.9 F (36.6 C) 98.3 F (36.8 C)  SpO2: 97%     Physical Exam:  Extremities:          CBC    Component Value Date/Time   WBC 6.9 02/02/2017 0705   RBC 3.20 (L) 02/02/2017 0705   HGB 9.7 (L) 02/02/2017 0705   HCT 31.1 (L) 02/02/2017 0705   PLT 445 (H) 02/02/2017 0705   MCV 97.2 02/02/2017 0705   MCH 30.3 02/02/2017 0705   MCHC 31.2 02/02/2017 0705   RDW 16.5 (H) 02/02/2017 0705   LYMPHSABS 1.1 01/27/2017 0537   MONOABS 0.7 01/27/2017 0537   EOSABS 0.2 01/27/2017 0537   BASOSABS 0.0 01/27/2017 0537    BMET    Component Value Date/Time   NA 132 (L) 02/02/2017 0705   K 3.1 (L) 02/02/2017 0705   CL 92 (L) 02/02/2017 0705   CO2 28 02/02/2017 0705   GLUCOSE 187 (H) 02/02/2017 0705   BUN 69 (H) 02/02/2017 0705   CREATININE 6.25 (H) 02/02/2017 0705   CALCIUM 8.4 (L) 02/02/2017 0705   GFRNONAA 8 (L) 02/02/2017 0705   GFRAA 9 (L) 02/02/2017 0705    INR    Component Value Date/Time   INR 1.20 01/09/2017 1424     Intake/Output Summary (Last 24 hours) at 02/06/17 1554 Last data filed at 02/06/17 0830  Gross per 24 hour  Intake            12743 ml  Output            13846 ml  Net            -1103 ml     Assessment:  76 y.o. 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  (see picture above).  Still at risk of wound not healing. Continue dry dressing for now.   -right groin probed with cotton tip applicator ~ 1cm deep.  Does not appear to track.  There was some drainage-the wound was cultured and packed with wet to dry dressing.  Will need bid wet to dry saline dressing changes.  Will start Keflex until cx  results.   Doreatha MassedSamantha Ellison Leisure, PA-C [facility] [phone] 02/06/2017 3:54 PM

## 2017-02-07 DIAGNOSIS — T8131XD Disruption of external operation (surgical) wound, not elsewhere classified, subsequent encounter: Secondary | ICD-10-CM

## 2017-02-07 DIAGNOSIS — K921 Melena: Secondary | ICD-10-CM

## 2017-02-07 LAB — GLUCOSE, CAPILLARY
GLUCOSE-CAPILLARY: 240 mg/dL — AB (ref 65–99)
GLUCOSE-CAPILLARY: 139 mg/dL — AB (ref 65–99)
Glucose-Capillary: 181 mg/dL — ABNORMAL HIGH (ref 65–99)

## 2017-02-07 MED ORDER — CLOPIDOGREL BISULFATE 75 MG PO TABS: 75.0000 mg | ORAL_TABLET | Freq: Every day | ORAL | 0 refills | Status: AC

## 2017-02-07 MED ORDER — INSULIN NPH (HUMAN) (ISOPHANE) 100 UNIT/ML ~~LOC~~ SUSP: 60.0000 [IU] | Freq: Every day | SUBCUTANEOUS | Status: DC

## 2017-02-07 MED ORDER — MIRTAZAPINE 30 MG PO TABS: 30.0000 mg | ORAL_TABLET | Freq: Every day | ORAL | 0 refills | Status: DC

## 2017-02-07 MED ORDER — OXYCODONE HCL 5 MG PO TABS: 2.5000 mg | ORAL_TABLET | Freq: Four times a day (QID) | ORAL | 0 refills | Status: DC | PRN

## 2017-02-07 MED ORDER — INSULIN NPH (HUMAN) (ISOPHANE) 100 UNIT/ML ~~LOC~~ SUSP: 10.0000 [IU] | Freq: Every day | SUBCUTANEOUS | 11 refills | Status: DC

## 2017-02-07 MED ORDER — ATORVASTATIN CALCIUM 80 MG PO TABS: 80.0000 mg | ORAL_TABLET | Freq: Every day | ORAL | 0 refills | Status: AC

## 2017-02-07 MED ORDER — INSULIN NPH (HUMAN) (ISOPHANE) 100 UNIT/ML ~~LOC~~ SUSP: 10.0000 [IU] | Freq: Every day | SUBCUTANEOUS | Status: DC

## 2017-02-07 MED ORDER — CEPHALEXIN 500 MG PO CAPS: 500.0000 mg | ORAL_CAPSULE | Freq: Two times a day (BID) | ORAL | 0 refills | Status: DC

## 2017-02-07 MED ORDER — INSULIN NPH (HUMAN) (ISOPHANE) 100 UNIT/ML ~~LOC~~ SUSP: 65.0000 [IU] | Freq: Every day | SUBCUTANEOUS | 11 refills | Status: DC

## 2017-02-07 MED ORDER — CIPROFLOXACIN HCL 250 MG PO TABS
250.0000 mg | ORAL_TABLET | Freq: Every day | ORAL | Status: DC
Start: 2017-02-07 — End: 2017-02-07

## 2017-02-07 MED ORDER — CARVEDILOL 6.25 MG PO TABS: 6.2500 mg | ORAL_TABLET | Freq: Every day | ORAL | 0 refills | Status: DC

## 2017-02-07 MED ORDER — CIPROFLOXACIN HCL 250 MG PO TABS: 250.0000 mg | ORAL_TABLET | Freq: Two times a day (BID) | ORAL | 0 refills | Status: DC

## 2017-02-07 MED ORDER — CALCIUM ACETATE 667 MG PO CAPS: 667.0000 mg | ORAL_CAPSULE | Freq: Three times a day (TID) | ORAL | 0 refills | Status: AC

## 2017-02-07 NOTE — Progress Notes (Signed)
Pt seen with Dr. Myra GianottiBrabham.  Will continue wet to dry dressing changes to right groin.    Wound cx from yesterday revealed abundant WBC predominantly PMN and few GNR.  Changed Keflex to Cipro.    Right 5th toe amputation still marginal, but looks okay right now.  Continue dry dressing.  F/u in Dr. Estanislado SpireBrabham's office on 02/13/17.   Doreatha MassedSamantha Coryn Mosso, [facility] 02/07/2017 11:33 AM

## 2017-02-07 NOTE — Progress Notes (Signed)
Patient's wife did return demonstration of dressing changes to all 3 wounds. Verbalized understanding of clean technique. All questions answered. Patient and spouse escorted to car with all belongings by NT.

## 2017-02-07 NOTE — Progress Notes (Signed)
  [facility] Progress Note   Assessment/ Plan:   Dialysis Orders:  Center: [place] [facility]. 6 exchanges/day one mid-day exchange (occurs around 9am).  2.5 liters of 2.5% per [facility]CPD 2 liters of 2.5% for last fill and mid day drain. Pt says EDW "about 202" (pre admission) but now is around 148 lbs or 67kg. Dr. Denny PeonBaveja  1 ESRD PD going well. Back to regular prescription at home 2 DM controlled 3 anemia esa, given Fe 4 Obesity 5 PVD- s/p BKA 6 Debill- in CIR, d/c today     Subjective:    Had some blood in the toilet bowl this AM; hgb stable, appears to have been hemorrhoids.  For d/c.    Objective:   BP (!) 144/56 (BP Location: Right Arm)   Pulse 92   Temp 98.1 F (36.7 C) (Axillary)   Resp 17   Ht 5\' 9"  (1.753 m)   Wt 65.8 kg (145 lb 1 oz)   SpO2 100%   BMI 21.42 kg/m   Physical Exam: General appearance: alert, cooperative, lying in bed Resp: diminished breath sounds bilaterally Cardio: S1, S2 normal and systolic murmur. GI: pos bs, PD cath LUQ, soft Extremities: LBKA with ACE wrap on, R toe amputation dressings c/d/i.  Labs: BMET  Recent Labs Lab 02/02/17 0705  NA 132*  K 3.1*  CL 92*  CO2 28  GLUCOSE 187*  BUN 69*  CREATININE 6.25*  CALCIUM 8.4*  PHOS 3.9   CBC  Recent Labs Lab 01/31/17 1440 02/02/17 0705 02/06/17 2027  WBC 9.0 6.9 8.3  HGB 10.2* 9.7* 10.1*  HCT 32.1* 31.1* 32.5*  MCV 97.0 97.2 97.3  PLT 462* 445* 406*    @IMGRELPRIORS @ Medications:    . aspirin  81 mg Oral Daily  . atorvastatin  80 mg Oral q1800  . calcium acetate  667 mg Oral TID WC  . carvedilol  6.25 mg Oral QHS  . ciprofloxacin  250 mg Oral Q breakfast  . clopidogrel  75 mg Oral Q breakfast  . darbepoetin (ARANESP) injection - NON-DIALYSIS  200 mcg Subcutaneous Q Thu-1800  . docusate sodium  100 mg Oral BID  . feeding supplement (PRO-STAT SUGAR FREE 64)  30 mL Oral BID  . gentamicin cream  1 application Topical BID  . gentamicin  cream  1 application Topical Daily  . heparin subcutaneous  5,000 Units Subcutaneous Q8H  . insulin aspart  0-5 Units Subcutaneous QHS  . insulin aspart  0-9 Units Subcutaneous TID WC  . insulin NPH Human  60 Units Subcutaneous QHS   And  . [START ON 02/08/2017] insulin NPH Human  10 Units Subcutaneous QAC breakfast  . mirtazapine  30 mg Oral QHS  . multivitamin  1 tablet Oral QHS  . protein supplement shake  2 oz Oral TID WC  . senna-docusate  2 tablet Oral BID  . Vitamin D (Ergocalciferol)  50,000 Units Oral Q30 days     Bufford ButtnerElizabeth Zelma Snead MD [facility] pgr [phone] 02/07/2017, 1:46 PM

## 2017-02-07 NOTE — Discharge Instructions (Signed)
Inpatient Rehab Discharge Instructions  Joshua [name] Discharge date and time:    Activities/Precautions/ Functional Status: Activity: no lifting, driving, or strenuous exercise  till cleared by MD Diet: diabetic diet Wound Care: Keep surgical sites clean and dry  Functional status:  ___ No restrictions     ___ Walk up steps independently ___ 24/7 supervision/assistance   ___ Walk up steps with assistance ___ Intermittent supervision/assistance  ___ Bathe/dress independently ___ Walk with walker     _x__ Bathe/dress with assistance ___ Walk Independently    ___ Shower independently ___ Walk with assistance    ___ Shower with assistance _X__ No alcohol     ___ Return to work/school ________     COMMUNITY REFERRALS UPON DISCHARGE:    Home Health:   PT     OT    RN                      Agency[facility]  [facility] Phone: [phone]   Medical Equipment/Items Ordered:  Power wheelchair, drop arm commode and 30" transfer board                                                      Agency/Supplier:  [facility] @ [phone]       Special Instructions: Continue peritoneal dialysis as directed  Wet-to-dry dressing to right groin twice daily. Dry dressing to toe change daily as needed.Telfa dressing with Kerlix to stump site daily as needed   My questions have been answered and I understand these instructions. I will adhere to these goals and the provided educational materials after my discharge from the hospital.  Patient/Caregiver Signature _______________________________ Date __________  Clinician Signature _______________________________________ Date __________  Please bring this form and your medication list with you to all your follow-up doctor's appointments.

## 2017-02-07 NOTE — Progress Notes (Signed)
Received a call from the provider on call regarding orders given for antibiotics today. Patient was placed on 2 different oral antibiotics, one started this morning (Doxycycline), and another provider placed him on Keflex to start tonight. Provider asked to hold both doses until she could get in contact with Dr.Patel to see if he wanted for the patient to receive both tonight as scheduled. Provider gave verbal order to discontinue the Doxycycline order. Order was discontinued. Other medications given without complications & tolerated well. Dressings are dry & intact at this time. Patient and wife were made aware of medication change and of interventions regarding the fecal blood. They both verbalized understanding. Patient stated no distress. Peritoneal dialysis in progress. No c/o pain or discomfort. No signs of respiratory distress noted. Will continue to monitor.

## 2017-02-07 NOTE — Progress Notes (Signed)
[facility]     PROGRESS NOTE  Subjective/Complaints:  Patient seen lying in bed this morning. He states he slept well overnight. He states he is ready for discharge today. He was evaluated by vascular surgery yesterday.  ROS: Denies nausea, vomiting, diarrhea, shortness of breath or chest pain    Objective: Vital Signs: Blood pressure (!) 144/56, pulse 92, temperature 98.1 F (36.7 C), temperature source Axillary, resp. rate 17, height 5\' 9"  (1.753 m), weight 65.8 kg (145 lb 1 oz), SpO2 100 %. No results found.  Recent Labs  02/06/17 2027  WBC 8.3  HGB 10.1*  HCT 32.5*  PLT 406*   No results for input(s): NA, K, CL, GLUCOSE, BUN, CREATININE, CALCIUM in the last 72 hours.  Invalid input(s): CO CBG (last 3)   Recent Labs  02/06/17 2053 02/07/17 0633 02/07/17 0920  GLUCAP 226* 240* 181*    Wt Readings from Last 3 Encounters:  02/07/17 65.8 kg (145 lb 1 oz)  01/17/17 86.7 kg (191 lb 2.2 oz)  01/05/17 87.9 kg (193 lb 12.6 oz)    Physical Exam:  BP (!) 144/56 (BP Location: Right Arm)   Pulse 92   Temp 98.1 F (36.7 C) (Axillary)   Resp 17   Ht 5\' 9"  (1.753 m)   Wt 65.8 kg (145 lb 1 oz)   SpO2 100%   BMI 21.42 kg/m  Constitutional: He appears well-nourished. NAD. Obese. HENT: Normocephalic. Areas of abrasion healing, except nose.  Eyes: EOMI. No discharge.  Cardiovascular: RRR. No JVD. Respiratory: CTA Bilaterally. Normal effort. GI: Soft. Bowel sounds are normal.   Musculoskeletal: He exhibits edemaand tenderness Left BKA dressed Right 5th digit ampuation Neurological: He is alert.  Motor: 4+/5 grossly throughout (unchanged) Skin:  BKA incision with medial dehiscence.  Right 5th dig amp with dehiscence Psychiatric: His speech is normal. He is flat   Assessment/Plan: 1. Functional deficits secondary to left BKA which require 3+ hours per day of interdisciplinary therapy in a comprehensive inpatient rehab  setting. Physiatrist is providing close team supervision and 24 hour management of active medical problems listed below. Physiatrist and rehab team continue to assess barriers to discharge/monitor patient progress toward functional and medical goals.  Function:  Bathing Bathing position   Position: Bed  Bathing parts Body parts bathed by patient: Right arm, Left arm, Chest, Abdomen, Front perineal area, Buttocks, Right upper leg, Left upper leg, Right lower leg Body parts bathed by helper: Back  Bathing assist Assist Level: Set up (per wife report)   Set up : To obtain items  Upper Body Dressing/Undressing Upper body dressing   What is the patient wearing?: Pull over shirt/dress     Pull over shirt/dress - Perfomed by patient: Thread/unthread right sleeve, Thread/unthread left sleeve, Put head through opening, Pull shirt over trunk Pull over shirt/dress - Perfomed by helper: Pull shirt over trunk        Upper body assist Assist Level: Set up (per wife report)      Lower Body Dressing/Undressing Lower body dressing   What is the patient wearing?: Pants, Socks, Shoes, Underwear Underwear - Performed by patient: Thread/unthread left underwear leg, Pull underwear up/down, Thread/unthread right underwear leg Underwear - Performed by helper: Thread/unthread right underwear leg Pants- Performed by patient: Thread/unthread right pants leg, Thread/unthread left pants leg, Pull pants up/down Pants- Performed by helper: Thread/unthread left pants leg   Non-skid slipper socks- Performed by helper: Don/doff right sock   Socks - Performed  by helper: Don/doff right sock Shoes - Performed by patient: Don/doff right shoe Shoes - Performed by helper: Don/doff right shoe          Lower body assist Assist for lower body dressing:  (Min assist - per wife report)      Toileting Toileting Toileting activity did not occur: No continent bowel/bladder event Toileting steps completed by  patient: Adjust clothing prior to toileting, Performs perineal hygiene Toileting steps completed by helper: Adjust clothing after toileting Toileting Assistive Devices: Grab bar or rail  Toileting assist Assist level:  (Mod assist)   Transfers Chair/bed transfer   Chair/bed transfer method: Lateral scoot Chair/bed transfer assist level: Moderate assist (Pt 50 - 74%/lift or lower) Chair/bed transfer assistive device: Sliding board Mechanical lift: Stedy   Locomotion Ambulation Ambulation activity did not occur: Safety/medical concerns         Wheelchair   Type: Manual Max wheelchair distance: 50 Assist Level: Supervision or verbal cues  Cognition Comprehension Comprehension assist level: Understands basic 90% of the time/cues < 10% of the time  Expression Expression assist level: Expresses complex 90% of the time/cues < 10% of the time  Social Interaction Social Interaction assist level: Interacts appropriately 75 - 89% of the time - Needs redirection for appropriate language or to initiate interaction.  Problem Solving Problem solving assist level: Solves basic 75 - 89% of the time/requires cueing 10 - 24% of the time  Memory Memory assist level: Recognizes or recalls 75 - 89% of the time/requires cueing 10 - 24% of the time    Medical Problem List and Plan: 1. Functional deficitssecondary to left BKA  D/c today  Will see patient for transitional care management within 7 days.  May weight bear through the heel on the right side 2. DVT Prophylaxis/Anticoagulation: Pharmaceutical: Heparin 3. Pain Management: Tylenol or Celebrex prn. Unable to tolerate muscle relaxers or pain medications due to SE of tremors, confusion and hallucinations.  4. Mood: LCSW to follow for evaluation and support.  5. Neuropsych: This patient is not fullycapable of making decisions on his own behalf. 6. Skin/Wound Care: Routine pressure relief measures. Prevalon boot for right foot. Monitor daily for  intake to promote healing. Protein supplement and vitamins to promote wound healing.   Per vascular, cont to monitor wounds, B/l LE wounds have dehisced.  Started on Keflex on 10/29  Patient will need close follow with vascular as well as Ortho to decrease chances of progressive amputations 7. Fluids/Electrolytes/Nutrition: Monitor I/O. Lytes/CBC per PD protocol.   Requires encouragement for nutrition  Remeron 15 mg started on 10/17, increased on 10/23 8. Leucocytosis: Resolved   Monitor for fevers and other sign of infection.  Afebrile  Cont to monitor 9. Hypotension/HTN: Monitor BP bid   Vitals:   02/07/17 0617 02/07/17 0637  BP: (!) 150/45 (!) 144/56  Pulse: 83 92  Resp: 16 17  Temp: 98.1 F (36.7 C) 98.1 F (36.7 C)  SpO2: 100% 100%   Midodrine d/ced 10/22  Relatively controlled on 10/30 10. ESRD on PD: Schedule PD in evening and early am--unhook by breakfast.  11. Anemia of chronic disease: On aranesp every week.   Hb 10.1 on 10/29  Per nursing report, patient noted to have blood in stool overnight. Hemoccult positive, however hemoglobin stable. Will speak with nursing, but plan for discharge with follow-up.  Cont to monitor 12. T2DM insulin dependent: Brittle diabetic who's hard to control in part due to inconsistent intake. Changed to Premier protein with  meals.   NPH decreased to to 10u qam   NPH decreased to 60u qpm CBG (last 3)   Recent Labs  02/06/17 2053 02/07/17 0633 02/07/17 0920  GLUCAP 226* 240* 181*   Remains labile, will need ambulatory adjustments 13. Constipation: Bowel reg increased on 10/22, again on 10/24  Improving 14. PVD with s/p left femoral pseudoaneurysm repair.   Prevalon boot for RLE with pressure relief measures.  15. Delirium: improved  LOS (Days) 21 A FACE TO FACE EVALUATION WAS PERFORMED  Josede Cicero Karis Juba 02/07/2017 9:23 AM

## 2017-02-08 ENCOUNTER — Telehealth: Payer: Self-pay | Admitting: *Deleted

## 2017-02-08 ENCOUNTER — Telehealth: Payer: Self-pay

## 2017-02-08 DIAGNOSIS — S88111S Complete traumatic amputation at level between knee and ankle, right lower leg, sequela: Secondary | ICD-10-CM

## 2017-02-08 NOTE — Telephone Encounter (Signed)
Transitional Care Call Completed, Appointment confirmed, Address confirmed, new patient packet mailedTransitional Care Questions   Questions for our staff to ask patients on Transitional care 48 hour phone call:   1. Are you/is patient experiencing any problems since coming home? No Are there any questions regarding any aspect of care?  No  2. Are there any questions regarding medications administration/dosing? No  Are meds being taken as prescribed? Yes Patient should review meds with caller to confirm   3. Have there been any falls?  No  4. Has Home Health been to the house and/or have they contacted you? PT has been out to the house already and RN visit today  If not, have you tried to contact them? Can we help you contact them?   5. Are bowels and bladder emptying properly? Yes  Are there any unexpected incontinence issues? No If applicable, is patient following bowel/bladder programs?   6. Any fevers, problems with breathing, unexpected pain?  No fevers, Breathing labored with exertion, Normal Pain resulting from amputations  7. Are there any skin problems or new areas of breakdown? Wound on residual limb is slow to heal and amputated toe wound keeps opening up  8. Has the patient/family member arranged specialty MD follow up (ie cardiology/neurology/renal/surgical/etc)? Patient has made appts with other recommended follow ups. Counseled pt's wife on establishing PCP care for diabetic needs Can we help arrange? No  9. Does the patient need any other services or support that we can help arrange?  No  10. Are caregivers following through as expected in assisting the patient? Yes with some difficulty  11. Has the patient quit smoking, drinking alcohol, or using drugs as recommended?  No smoke, No alcohol, No illicit drug use

## 2017-02-08 NOTE — Discharge Summary (Signed)
Physician Discharge Summary  Patient ID: [name] MRN: [id number] DOB/AGE: 1941/01/05 76 y.o.  Admit date: 01/17/2017 Discharge date: 02/07/2017  Discharge Diagnoses:  Principal Problem:   Unilateral complete BKA, right, sequela (HCC) Active Problems:   Diabetic peripheral neuropathy (HCC)   Anemia of chronic disease   IDDM (insulin dependent diabetes mellitus) (HCC)   Acute blood loss anemia   Arterial hypotension   PVD (peripheral vascular disease) (HCC)   Delirium   Adjustment disorder with depressed mood   Poor nutrition   Brittle diabetes (HCC)   Toe amputation status, right (HCC)   Pressure injury of head, stage 3 (HCC)   Rupture of operation wound   Blood in stool   Discharged Condition: stable   Significant Diagnostic Studies: N/A   Labs:  Basic Metabolic Panel: BMP Latest Ref Rng & Units 02/02/2017 01/30/2017 01/29/2017  Glucose 65 - 99 mg/dL 409(W187(H) 119(J223(H) 478(G113(H)  BUN 6 - 20 mg/dL 95(A69(H) 21(H75(H) 08(M80(H)  Creatinine 0.61 - 1.24 mg/dL 5.78(I6.25(H) 6.96(E6.75(H) 9.52(W7.32(H)  Sodium 135 - 145 mmol/L 132(L) 128(L) 127(L)  Potassium 3.5 - 5.1 mmol/L 3.1(L) 3.4(L) 3.4(L)  Chloride 101 - 111 mmol/L 92(L) 88(L) 89(L)  CO2 22 - 32 mmol/L 28 25 26   Calcium 8.9 - 10.3 mg/dL 4.1(L8.4(L) 8.3(L) 8.1(L)    CBC: CBC Latest Ref Rng & Units 02/06/2017 02/02/2017 01/31/2017  WBC 4.0 - 10.5 K/uL 8.3 6.9 9.0  Hemoglobin 13.0 - 17.0 g/dL 10.1(L) 9.7(L) 10.2(L)  Hematocrit 39.0 - 52.0 % 32.5(L) 31.1(L) 32.1(L)  Platelets 150 - 400 K/uL 406(H) 445(H) 462(H)    CBG:  Recent Labs Lab 02/06/17 1652 02/06/17 2053 02/07/17 0633 02/07/17 0920 02/07/17 1137  GLUCAP 132* 226* 240* 181* 139*    Brief HPI:   [name] a 76 y.o.malewith history of COPD, T2DM, CAD s/p ICD, ESRD- on PD, PAD with critical limb ischemia with non-healing LLE wound who was admitted for right femoral pseudoaneurysm repair and resection of Left 3rd MT head on 12/15/16 by [name]. Hospital course complicated by  N/V, leucocytosis and fever likely due to sepsis. GB ultrasound revealed GB polyp without acute cholecystitis. He has had difficulty swallowing due to oral pharyngeal ulcers and [name] recommended supportive care for management. He has had issues with right groin wound dehiscence of wound with gangrenous changes left foot. He required left transmetatarsal amputation with VAC placement by [name] on 9/14 and right groin hematoma showed decrease in erythema.  He was admitted to CIR on 12/27/2016 due to deficits in mobility and decreased ability to carry out ADL tasks. His was noted to have hypotensive episodes with fatigue and frequently during the pm sessions. Nephrology has been following for management of PD and decreased amount of fluid removed with PD. Insulin has been titrated upwards slowly to prevent hypoglycemic episodes as blood sugars have been difficulty to control due to variable intake. He developed increase in pain left foot with ischemic changes and skin break down requiring surgical intervention. He was discharged to acute on 01/06/17 due to ongoing fevers, delirium with hallucinations, hypotension and concerns of developing sepsis. He did develop jaw pain radiating to chest on 9/30 with SEMI and elevated troponin. He was started on IV heparin and Cardiac cath did not show critical stenosis--he was felt to be moderate risk for surgery. He underwent left transtibial amputation on 10/03 by [name] and has defervesced. Therapy ongoing and patient with decreased balance, difficulty following simple command consistently as well as debility compounded  by L-BKA. CIR recommended to resume rehab course.    Hospital Course: [name] was admitted to rehab 01/17/2017 for inpatient therapies to consist of PT and OT at least three hours five days a week. Past admission physiatrist, therapy team and rehab RN have worked together to provide customized collaborative inpatient rehab. He was  found to have worsening of right fifth toe ulcer as time of admission and vascular was consulted for input. He underwent 5th toe amputation on 10/12 by [name] and small localized abscess was identified at time of surgery. He is to continue touch down weigh bearing on right heel with Darco shoe. Post op pain has been managed with addition of low dose oxycodone and was able to tolerate this without confusion or lethargy. Blood pressures were monitored on bid basis and hypotension had resolved. As blood pressures started trending upwards,  midodrine was decreased to 10 mg daily.   Delirium has resolved and Remeron was added to help with mood stabilization due to reactive depression as well as to stimulate appetite. Nutritional supplements were offered to help promote wound healing and stabilize blood sugars. Diabetes has been monitored on ac/hs basis and as intake improved, NPH was titrated upwards to 65 units.  CCPD has been ongoing with nephrology following daily for assistance. New dry weight is around 148 lbs (67 kg) Acute on chronic anemia was treated with dose of feraheme on 10/26 as well as darbepoetin weekly. Acute on chronic anemia has improved with rise in H/H to 10.1/32.5. Reactive leucocytosis has resolved. He did have blood in stools felt to be due to hemorrhoids.  He will need to outpatient follow up and work up if this recurs.   Right transtibial amputation site had small amount of drainage due to resolving hematoma and staples were removed on 10/18 per [name] input. He recommended stump shrinker against incision with abd pad outside to collect drainage. His activity tolerance was improving and dizziness was occurring less frequently. Abrasions on face due to BIPAP are dry, stable and treated with padding to nose.  Prior to discharge, he developed separation of wound edges at toe amputation site as well as drainage from right groin with 1 cm deep tract. [name] was contacted for input  and and  recommended  packing groin with wet to dry dressing daily.  He was started on doxycyline which was broadened to cipro at discharge.  Wound was cultured and showed abundant WBC with moderate E coli. Team has provided ego support to help with reactive symptoms. He has progressed to min to mod assist at wheelchair level. He will continue to receive follow up HHPT and HHRN by [facility] after discharge.    Rehab course: During patient's stay in rehab weekly team conferences were held to monitor patient's progress, set goals and discuss barriers to discharge. At admission, patient required total assist with basic self-care tasks and max assist with mobility. He has had improvement in activity tolerance, balance, postural control, as well as ability to compensate for deficits.  He was able to complete ADL tasks at min assist level and needed mod assist with toileting.  Sliding board transfers were recommended when patient was fatigued or with uneven surface. His wife had been present for most therapy sessions with hands on training for education with transfers, safety and is able to provide physical and cognitive assistance.    Disposition: 01-Home or Self Care  Diet: Diabetic diet.   Special Instructions: 1.  Perform wet-to-dry dressing to right groin twice daily. Dry dressing to toe change daily as needed.Telfa dressing with Kerlix to stump site daily as needed 2. Monitor BS 2-3 times a day and follow up primary MD for adjustment of insulin.  3. Wear prevalon boot on right foot when in bed.  4. Needs stool guaiacs X 3 to monitor for blood in stools.   Discharge Medication List as of 02/07/2017  1:24 PM    START taking these medications   Details  ciprofloxacin (CIPRO) 250 MG tablet Take 1 tablet (250 mg total) by mouth 2 (two) times daily., Starting Tue 02/07/2017, Print    mirtazapine (REMERON) 30 MG tablet Take 1 tablet (30 mg total) by mouth at bedtime., Starting Tue  02/07/2017, Print    oxyCODONE (OXY IR/ROXICODONE) 5 MG immediate release tablet Take 0.5 tablets (2.5 mg total) by mouth every 6 (six) hours as needed for severe pain., Starting Tue 02/07/2017, Print--Rx # 20 pills      CONTINUE these medications which have CHANGED   Details  atorvastatin (LIPITOR) 80 MG tablet Take 1 tablet (80 mg total) by mouth daily at 6 PM., Starting Tue 02/07/2017, Print    calcium acetate (PHOSLO) 667 MG capsule Take 1 capsule (667 mg total) by mouth 3 (three) times daily with meals., Starting Tue 02/07/2017, Print    carvedilol (COREG) 6.25 MG tablet Take 1 tablet (6.25 mg total) by mouth at bedtime., Starting Tue 02/07/2017, Print    clopidogrel (PLAVIX) 75 MG tablet Take 1 tablet (75 mg total) by mouth daily., Starting Tue 02/07/2017, Print    !! insulin NPH Human (HUMULIN N,NOVOLIN N) 100 UNIT/ML injection Inject 0.1 mLs (10 Units total) into the skin daily before breakfast., Starting Tue 02/07/2017, Print    !! insulin NPH Human (HUMULIN N,NOVOLIN N) 100 UNIT/ML injection Inject 0.65 mLs (65 Units total) into the skin at bedtime., Starting Tue 02/07/2017, Print     !! - Potential duplicate medications found. Please discuss with provider.    CONTINUE these medications which have NOT CHANGED   Details  aspirin EC 81 MG tablet Take 81 mg by mouth daily., Historical Med    docusate sodium (COLACE) 100 MG capsule Take 100 mg by mouth 2 (two) times daily. , Historical Med    multivitamin (RENA-VIT) TABS tablet Take 1 tablet by mouth at bedtime., Starting Tue 01/17/2017, No Print    nitroGLYCERIN (NITROSTAT) 0.4 MG SL tablet Place 0.4 mg under the tongue every 5 (five) minutes as needed for chest pain., Historical Med    polyethylene glycol (MIRALAX / GLYCOLAX) packet Take 17 g by mouth daily., Starting Wed 01/18/2017, No Print    Vitamin D, Ergocalciferol, (DRISDOL) 50000 units CAPS capsule Take 50,000 Units by mouth every 30 (thirty) days. , Historical Med       STOP taking these medications     Amino Acids-Protein Hydrolys (FEEDING SUPPLEMENT, PRO-STAT SUGAR FREE 64,) LIQD      cephALEXin (KEFLEX) 500 MG capsule      midodrine (PROAMATINE) 5 MG tablet      Nutritional Supplements (FEEDING SUPPLEMENT, NEPRO CARB STEADY,) LIQD      ondansetron (ZOFRAN) 4 MG tablet      oxyCODONE-acetaminophen (PERCOCET/ROXICET) 5-325 MG tablet      senna (SENOKOT) 8.6 MG TABS tablet        Follow-up Information    Marcello Fennel, MD Follow up.   Specialty:  Physical Medicine and Rehabilitation Why:  office will call you with  follow up appointment Contact information: [address] [place] [address] [phone]        Nadara Mustard, MD. Call in 1 day(s).   Specialty:  Orthopedic Surgery Why:  for follow up appointment Contact information: [address] [place] [address] [phone]        Nada Libman, MD Follow up.   Specialties:  Vascular Surgery, Cardiology Why:  call today for follow up appointment Contact information: [address] [place] [address] [phone]        Terrial Rhodes, MD Follow up.   Specialty:  Nephrology Why:  Call for appointment Contact information: [address] [place] [address] [phone]        Dhivianathan, Birdie Hopes, MD Follow up on 02/14/2017.   Specialty:  Family Medicine Why:  @ 12:00 pm (hospital follow up appt) Contact information: [address] [place] [address] [phone]           Signed: Jacquelynn Cree 02/09/2017, 4:14 PM

## 2017-02-08 NOTE — Telephone Encounter (Signed)
He should be at wheelchair level at present given his wounds.  Thanks.

## 2017-02-08 NOTE — Progress Notes (Signed)
Social Work  Discharge Note  The overall goal for the admission was met for:   Discharge location: Yes - home with wife providing 24/7 assistance  Length of Stay: Yes - 21 days  Discharge activity level: Yes - min/ mod assist w/c level overall  Home/community participation: Yes  Services provided included: MD, RD, PT, OT, RN, TR, Pharmacy, [place]: Medicare and Private Insurance: [place]  Follow-up services arranged: Home Health: Therapist, sports, PT via [employer] [facility] [place], DME: wide drop arm commode, 30" transfer board via [place] and Patient/Family has no preference for HH/DME agencies  Comments (or additional information):  Patient/Family verbalized understanding of follow-up arrangements: Yes  Individual responsible for coordination of the follow-up plan: pt  Confirmed correct DME delivered: Eiman Maret 02/08/2017    Trini Christiansen

## 2017-02-08 NOTE — Telephone Encounter (Signed)
Ann RN with [place] nursing [place] hospice [place] is requesting a standard walker for patient.

## 2017-02-09 LAB — AEROBIC CULTURE W GRAM STAIN (SUPERFICIAL SPECIMEN)

## 2017-02-09 LAB — AEROBIC CULTURE  (SUPERFICIAL SPECIMEN)

## 2017-02-09 NOTE — Telephone Encounter (Signed)
Contacted HH RN. She is well aware of the wounds and understands clearly that the patient needs to stay off his feet as much as possible.  The need for the walker is for transfer assistance. The wife is smaller and has noticeable issues herself.  She is alone with husband most of the time, bearing the sole responsibility of helping patient around.  She needs the walker to help transfer patient.

## 2017-02-09 NOTE — Telephone Encounter (Signed)
We may give the order for a walker if patient is able to maintain WB through heel only with transfers.  Thanks.

## 2017-02-10 NOTE — Telephone Encounter (Addendum)
Called the RN allison back, relayed the information about heel only transfers.  She also requested if Dr. Allena KatzPatel can wright the prescription for the walker, and also to see which doctor is giving the orders for his wound care, she needs clarification on how to continue the dressing on the amputation site, states was told that the dressing was gauze and tape and when it was opened for cleaning, xeroform was found as well.   Please advise

## 2017-02-12 NOTE — Telephone Encounter (Signed)
We can write a prescription for a rolling walker.  [name] is giving orders for the left BKA. Vascular Surgery is giving orders for the right toe amputation. Thanks.

## 2017-02-13 ENCOUNTER — Telehealth (INDEPENDENT_AMBULATORY_CARE_PROVIDER_SITE_OTHER): Payer: Self-pay | Admitting: Orthopedic Surgery

## 2017-02-13 ENCOUNTER — Ambulatory Visit (INDEPENDENT_AMBULATORY_CARE_PROVIDER_SITE_OTHER): Payer: Medicare Other | Admitting: Family

## 2017-02-13 ENCOUNTER — Encounter: Payer: Self-pay | Admitting: Family

## 2017-02-13 VITALS — BP 110/62 | HR 70 | Temp 97.1°F | Resp 18 | Ht 69.0 in

## 2017-02-13 DIAGNOSIS — I7025 Atherosclerosis of native arteries of other extremities with ulceration: Secondary | ICD-10-CM

## 2017-02-13 DIAGNOSIS — E1151 Type 2 diabetes mellitus with diabetic peripheral angiopathy without gangrene: Secondary | ICD-10-CM

## 2017-02-13 DIAGNOSIS — E1165 Type 2 diabetes mellitus with hyperglycemia: Secondary | ICD-10-CM

## 2017-02-13 DIAGNOSIS — IMO0002 Reserved for concepts with insufficient information to code with codable children: Secondary | ICD-10-CM

## 2017-02-13 DIAGNOSIS — Z89512 Acquired absence of left leg below knee: Secondary | ICD-10-CM

## 2017-02-13 DIAGNOSIS — T8131XA Disruption of external operation (surgical) wound, not elsewhere classified, initial encounter: Secondary | ICD-10-CM

## 2017-02-13 NOTE — Telephone Encounter (Signed)
Walker ordered.  

## 2017-02-13 NOTE — Telephone Encounter (Signed)
Notified nurse that [name] is writing orders for the BKA and vascular surgery is writing the orders for the toe amputation

## 2017-02-13 NOTE — Telephone Encounter (Signed)
The home health nurse Marchelle Folksmanda called asking about the patients bandages, she just wanted some clarification on the wound orders. CB # [phone] Direct line to Marchelle [phone]- [phone]

## 2017-02-13 NOTE — Addendum Note (Signed)
Addended by: Angela NevinWESSLING, Abdulkarim Eberlin D on: 02/13/2017 08:04 AM   Modules accepted: Orders

## 2017-02-13 NOTE — Telephone Encounter (Signed)
I did speak with [name] earlier. Advised that patient has appointment today to follow up with Dr. Coralee PesaBrabhams office for toe amputation. Patient has follow up tomorrow for left bka with [name]. Advised ok to continue with current wound care of xeroform dressing. Will call with updated orders if there is a change in care after tomorrows visit.

## 2017-02-13 NOTE — Progress Notes (Addendum)
Postoperative Visit   History of Present Illness  [name] is a 76 y.o. male who is s/p CSI orbital atherectomy of left AT with 1.5 diamondback, balloon angioplasty of left AT with 3mm balloon, Left DP with 2.1025mm balloon, and resection of left 3rd metatarsal head on 12-15-16 by [name]. He is also s/p left transtibial amputation on 01-11-17 by [name] after transmetatarsal amputation with progressive dehiscence and necrosis to the residual limb. He is then s/p amputation right fifth toe including metatarsal head on 01-20-17 by Dr. Myra GianottiBrabham foriInfected right fifth toe.  He returns today for suture removal.  Pt is scheduled to see [name] tomorrow.   Pt states HH comes three times/week, wet to dry NS dressing to right 5th toe amputation site. Pt and wife state that the right foot looks worse.  A1C was 8.8 on 12-24-16.  He is a former smoker.  Wife states he has been taking Cipro, and still taking, since discharged from hospital on 02-07-17.  Pt denies fever or chills. He states he is allergic to sulfa and vancomycin, not allergic to PCN.    The patient notes pain is well controlled.   For VQI Use Only  PRE-ADM LIVING: Home  AMB STATUS: Wheelchair    Past Medical History:  Diagnosis Date  . AICD (automatic cardioverter/defibrillator) present    [employer] [id number] lead [id number]  . CHF (congestive heart failure) (HCC)   . Complication of anesthesia   . COPD (chronic obstructive pulmonary disease) (HCC)   . Coronary artery disease    CABG 2011  . Diabetes mellitus without complication (HCC)   . ESRD on peritoneal dialysis [facility](HCC)    [place].   [name]. History of peritonitis    PD cath related peritonitis in April 2017  . Hypertension   . Peripheral vascular disease (HCC)   . PONV (postoperative nausea and vomiting)   . Sjogren's syndrome (HCC)    Per pt's wife, was diagnosed in 2016 during w/u for cause  of renal failure prior to starting dialysis.  He had a rash on his chest apparently prompting this w/u.  Never had renal bx.  He [place] in [place] and per the wife he was treated with MTX and other medications but had side effects to "all of it" and isn't taking anything for this as of Jun 2017.   . Stroke [facility](HCC)     Past Surgical History:  Procedure Laterality Date  . cardiac catherization with stent placement  2017  . CORONARY ARTERY BYPASS GRAFT  2011    Social History   Socioeconomic History  . Marital status: Married    Spouse name: Not on file  . Number of children: Not on file  . Years of education: Not on file  . Highest education level: Not on file  Social Needs  . Financial resource strain: Not on file  . Food insecurity - worry: Not on file  . Food insecurity - inability: Not on file  . Transportation needs - medical: Not on file  . Transportation needs - non-medical: Not on file  Occupational History  . Not on file  Tobacco Use  . Smoking status: Former Games developermoker  . Smokeless tobacco: Never Used  Substance and Sexual Activity  . Alcohol use: No  . Drug use: No  . Sexual activity: No  Other Topics Concern  . Not  on file  Social History Narrative  . Not on file    Allergies  Allergen Reactions  . Lisinopril Hives  . Methotrexate Derivatives Other (See Comments)    blisters  . Sulfa Antibiotics Hives  . Tape Hives  . Vancomycin Other (See Comments)    Blisters   . Zanaflex [Tizanidine Hcl]     Hypotensive stroke    Current Outpatient Medications on File Prior to Visit  Medication Sig Dispense Refill  . aspirin EC 81 MG tablet Take 81 mg by mouth daily.    [name] atorvastatin (LIPITOR) 80 MG tablet Take 1 tablet (80 mg total) by mouth daily at 6 PM. 30 tablet 0  . calcium acetate (PHOSLO) 667 MG capsule Take 1 capsule (667 mg total) by mouth 3 (three) times daily with meals. 90 capsule 0  . carvedilol (COREG) 6.25 MG tablet Take 1 tablet  (6.25 mg total) by mouth at bedtime. 30 tablet 0  . ciprofloxacin (CIPRO) 250 MG tablet Take 1 tablet (250 mg total) by mouth 2 (two) times daily. 20 tablet 0  . clopidogrel (PLAVIX) 75 MG tablet Take 1 tablet (75 mg total) by mouth daily. 30 tablet 0  . docusate sodium (COLACE) 100 MG capsule Take 100 mg by mouth 2 (two) times daily.     . insulin NPH Human (HUMULIN N,NOVOLIN N) 100 UNIT/ML injection Inject 0.1 mLs (10 Units total) into the skin daily before breakfast. 10 mL 11  . insulin NPH Human (HUMULIN N,NOVOLIN N) 100 UNIT/ML injection Inject 0.65 mLs (65 Units total) into the skin at bedtime. 10 mL 11  . multivitamin (RENA-VIT) TABS tablet Take 1 tablet by mouth at bedtime.  0  . nitroGLYCERIN (NITROSTAT) 0.4 MG SL tablet Place 0.4 mg under the tongue every 5 (five) minutes as needed for chest pain.    [name] oxyCODONE (OXY IR/ROXICODONE) 5 MG immediate release tablet Take 0.5 tablets (2.5 mg total) by mouth every 6 (six) hours as needed for severe pain. 20 tablet 0  . polyethylene glycol (MIRALAX / GLYCOLAX) packet Take 17 g by mouth daily.    . Vitamin D, Ergocalciferol, (DRISDOL) 50000 units CAPS capsule Take 50,000 Units by mouth every 30 (thirty) days.     . mirtazapine (REMERON) 30 MG tablet Take 1 tablet (30 mg total) by mouth at bedtime. (Patient not taking: Reported on 02/13/2017) 30 tablet 0   No current facility-administered medications on file prior to visit.       Physical Examination  Vitals:   02/13/17 1310  BP: 110/62  Pulse: 70  Resp: 18  Temp: (!) 97.1 F (36.2 C)  TempSrc: Oral  SpO2: 98%  Height: 5\' 9"  (1.753 m)   Body mass index is 21.42 kg/m.    Right foot, lateral view    Right foot, anterior view    Doppler signals fairly brisk in right foot DP, PT, and peroneal.  Right foot is ruddy, toes are moderately edematous. Right 5th toe amputation site incision edges are separated. (see above photos). Right groin with small opening, wicking dressing in  place, evidence of contraction and healing of wound, no evidence of infection.   Medical Decision Making  [name] is a 76 y.o. male who presents s/p left transtibial amputation on 01-11-17 by Dr. Lajoyce Corners after transmetatarsal amputation with progressive dehiscence and necrosis to the residual limb. He is then s/p amputation right fifth toe including metatarsal head on 01-20-17 by [name] foriInfected right fifth toe.  [name]  spoke with pt and wife and examined pt.  Impression is that right foot is becoming ischemic, but can give it time to see if the wound will show signs of healing.  Sutures left intact.  Continue Cipro as prescribed.  Continue dressing changes to right groin and right foot by [facility] and wife daily.  Will follow up in 2 weeks for right foot wound check. Pt and wife advised to notify us if right foot seems to be worse.     [name], Carma Lair, RN, MSN, FNP-C [facility] Office: [phone]  02/13/2017, 1:36 PM  Clinic MD: Myra Gianotti

## 2017-02-14 ENCOUNTER — Telehealth (INDEPENDENT_AMBULATORY_CARE_PROVIDER_SITE_OTHER): Payer: Self-pay | Admitting: *Deleted

## 2017-02-14 ENCOUNTER — Encounter: Payer: Self-pay | Admitting: Podiatry

## 2017-02-14 ENCOUNTER — Ambulatory Visit (INDEPENDENT_AMBULATORY_CARE_PROVIDER_SITE_OTHER): Payer: Medicare Other | Admitting: Orthopedic Surgery

## 2017-02-14 ENCOUNTER — Encounter (INDEPENDENT_AMBULATORY_CARE_PROVIDER_SITE_OTHER): Payer: Self-pay | Admitting: Orthopedic Surgery

## 2017-02-14 VITALS — Ht 69.0 in | Wt 145.0 lb

## 2017-02-14 DIAGNOSIS — Z89512 Acquired absence of left leg below knee: Secondary | ICD-10-CM

## 2017-02-14 MED ORDER — MUPIROCIN 2 % EX OINT: 1.0000 "application " | TOPICAL_OINTMENT | Freq: Every day | CUTANEOUS | 6 refills | Status: DC

## 2017-02-14 MED ORDER — CYCLOBENZAPRINE HCL 10 MG PO TABS: 10.0000 mg | ORAL_TABLET | Freq: Two times a day (BID) | ORAL | 0 refills | Status: DC | PRN

## 2017-02-14 NOTE — Telephone Encounter (Signed)
Marchelle Folksmanda from [facility] called stating pt has appt today with Lajoyce CornersDuda and wanted to update him on pt condition before he comes in, states pt is having some muscle spasms in R leg and causes pain and wants to know if can give him some muscle relaxers to help with the spasms? Also, wants to let you know pt has been having some pain when urinates and burning and has requested if you can do a UA on pt for UTI, if not then can she get order faxed over for them to do it to [phone].

## 2017-02-14 NOTE — Progress Notes (Signed)
Post-Op Visit Note   Patient: [name] KitchenJames D Dismore           Date of Birth: 03-13-1941           MRN: [id number] Visit Date: 02/14/2017 PCP: Dorice Lamashivianathan, Susan S, MD  Chief Complaint:  Chief Complaint  Patient presents with  . Left Leg - Routine Post Op    01/11/17 left transtibial amputation     HPI:  HPI Patient is a 76 year old gentleman seen today status post left transtibial amputation on 01/11/17. Did have a prolonged hospitalization. Has had assistance of HH for dressing changes. Having Xeroform dressing changes with kerlex and ace. Medially has been slow to heal.   Is following with Dr. Myra GianottiBrabham as well for toe amputations on the right foot. States is still on oral abx bid.   Does request a muscle relaxer for spasm in LLE.   States blood sugars run anywhere from 103-389.  Ortho Exam On examination of left below knee amputation does have some medial dehiscence. Is well healed laterally. Medially has an ulceration 4 cm x 1 cm. Has an area that is 8 mm deep. 75% eschar and fibrinous tissue in wound. Scant serosanguinous drainage.   Visit Diagnoses: No diagnosis found.  Plan: allergic to silvadene, will have him begin mupirocin dressing changes. Pack wound open with gauze. [facility].   Follow-Up Instructions: No Follow-up on file.   Imaging: No results found.  Orders:  No orders of the defined types were placed in this encounter.  Meds ordered this encounter  Medications  . mupirocin ointment (BACTROBAN) 2 %    Sig: Apply 1 application daily topically.    Dispense:  22 g    Refill:  6  . cyclobenzaprine (FLEXERIL) 10 MG tablet    Sig: Take 1 tablet (10 mg total) 2 (two) times daily as needed by mouth for muscle spasms.    Dispense:  30 tablet    Refill:  0     PMFS History: Patient Active Problem List   Diagnosis Date Noted  . Blood in stool   . Wound dehiscence   . Rupture of operation wound   . Toe amputation status, right (HCC)   .  Pressure injury of head, stage 3 (HCC)   . Poor nutrition   . Brittle diabetes (HCC)   . Adjustment disorder with depressed mood   . Hypoglycemia   . Ischemic pain of right foot   . Arterial hypotension   . Diabetes mellitus type 2 in obese (HCC)   . PVD (peripheral vascular disease) (HCC)   . Delirium   . Unilateral complete BKA, right, sequela (HCC) 01/17/2017  . Acute blood loss anemia   . Elevated AST (SGOT) 01/09/2017  . Chest pain 01/09/2017  . NSTEMI (non-ST elevated myocardial infarction) (HCC) 01/09/2017  . Hypokalemia 01/07/2017  . Hypomagnesemia 01/07/2017  . Left foot infection 01/06/2017  . Chronic hypotension 01/06/2017  . 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 IDDM (insulin dependent diabetes mellitus) (HCC) 01/06/2017  . Foot infection 01/06/2017  . Labile blood pressure   . Hallucination   . Benign essential HTN   . Ulcer of toe of right foot (HCC)   . Ulcer of external ear, limited to breakdown of skin (HCC)   . Chronic congestive heart failure (HCC)   . Anemia of chronic disease   . Labile blood glucose   . 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  Diabetic peripheral neuropathy (HCC)   . Debility 12/27/2016  . History of transmetatarsal amputation of left foot (HCC) 12/27/2016  . Constipation   . Chronic obstructive pulmonary disease (HCC)   . Nausea   . ESRD on dialysis (HCC)   . Leukocytosis   . Tachypnea   . FUO (fever of unknown origin)   . Encephalopathy 12/23/2016  . Sore throat 12/23/2016  . Gangrene of left foot (HCC) 11/26/2016  . PAD (peripheral artery disease) (HCC) 11/26/2016  . Sjogren's syndrome (HCC) 09/26/2015  . Uncontrolled type 2 diabetes mellitus with hyperglycemia, with long-term current use of insulin (HCC) 09/26/2015  . Hyperosmolar non-ketotic state in patient with type 2 diabetes mellitus (HCC) 09/25/2015  . Ileus (HCC) 09/22/2015  . Acute respiratory failure  with hypoxia (HCC) 09/22/2015  . Acute gouty arthritis 09/22/2015  . CAD (coronary artery disease) of artery bypass graft 09/22/2015  . Sepsis (HCC) 09/21/2015  . Elevated troponin level 09/21/2015  . ESRD on peritoneal dialysis (HCC) 09/21/2015  . CVA (cerebral infarction) 09/21/2015   Past Medical History:  Diagnosis Date  . AICD (automatic cardioverter/defibrillator) present    [employer] [id number] lead [id number]  . CHF (congestive heart failure) (HCC)   . Complication of anesthesia   . COPD (chronic obstructive pulmonary disease) (HCC)   . Coronary artery disease    CABG 2011  . Diabetes mellitus without complication (HCC)   . ESRD on peritoneal dialysis [facility](HCC)    [place].   [name]. History of peritonitis    PD cath related peritonitis in April 2017  . Hypertension   . Peripheral vascular disease (HCC)   . PONV (postoperative nausea and vomiting)   . Sjogren's syndrome (HCC)    Per pt's wife, was diagnosed in 2016 during w/u for cause of renal failure prior to starting dialysis.  He had a rash on his chest apparently prompting this w/u.  Never had renal bx.  [place] and per the wife he was treated with MTX and other medications but had side effects to "all of it" and isn't taking anything for this as of Jun 2017.   . Stroke [facility](HCC)     Family History  Problem Relation Age of Onset  . Heart disease Father     Past Surgical History:  Procedure Laterality Date  . cardiac catherization with stent placement  2017  . CORONARY ARTERY BYPASS GRAFT  2011   Social History   Occupational History  . Not on file  Tobacco Use  . Smoking status: Former Games developermoker  . Smokeless tobacco: Never Used  Substance and Sexual Activity  . Alcohol use: No  . Drug use: No  . Sexual activity: No

## 2017-02-14 NOTE — Telephone Encounter (Signed)
I called and spoke with [name]. Rx was sent in for muporicin, and muscle relaxer. They will follow up with medical doctor about possible UTI.

## 2017-02-15 ENCOUNTER — Telehealth: Payer: Self-pay

## 2017-02-15 NOTE — Telephone Encounter (Signed)
[name] called needing orders for patients walker and an order for UA. She stated that she doesn't know where the walker got sent as this was discussed previously.

## 2017-02-15 NOTE — Telephone Encounter (Addendum)
Delma Officerontacted Amanda, RN, [facility].  She is asking for verbal orders for a UA. Patients urine is cloudy.  Patient is experiencing a burning and stinging sensation while voiding.  Dr. Allena KatzPatel agrees to the verbal order.  He would like the results to go directly to the patients PCP and/or nephrologist.  He also signed an Rx script for a standard walker that [facility] at fax# [phone]. Home health nurse given verbal orders per  Dr. Jill AlexandersPatels verbal  Order.

## 2017-02-16 ENCOUNTER — Encounter: Payer: Self-pay | Admitting: Physical Medicine & Rehabilitation

## 2017-02-16 ENCOUNTER — Other Ambulatory Visit: Payer: Self-pay

## 2017-02-16 ENCOUNTER — Encounter: Payer: Medicare Other | Attending: Physical Medicine & Rehabilitation | Admitting: Physical Medicine & Rehabilitation

## 2017-02-16 VITALS — BP 108/60 | HR 88

## 2017-02-16 DIAGNOSIS — I953 Hypotension of hemodialysis: Secondary | ICD-10-CM

## 2017-02-16 DIAGNOSIS — T8131XD Disruption of external operation (surgical) wound, not elsewhere classified, subsequent encounter: Secondary | ICD-10-CM

## 2017-02-16 DIAGNOSIS — I132 Hypertensive heart and chronic kidney disease with heart failure and with stage 5 chronic kidney disease, or end stage renal disease: Secondary | ICD-10-CM | POA: Diagnosis not present

## 2017-02-16 DIAGNOSIS — I509 Heart failure, unspecified: Secondary | ICD-10-CM | POA: Insufficient documentation

## 2017-02-16 DIAGNOSIS — S88111S Complete traumatic amputation at level between knee and ankle, right lower leg, sequela: Secondary | ICD-10-CM | POA: Diagnosis not present

## 2017-02-16 DIAGNOSIS — Z8249 Family history of ischemic heart disease and other diseases of the circulatory system: Secondary | ICD-10-CM | POA: Insufficient documentation

## 2017-02-16 DIAGNOSIS — J449 Chronic obstructive pulmonary disease, unspecified: Secondary | ICD-10-CM | POA: Diagnosis not present

## 2017-02-16 DIAGNOSIS — N186 End stage renal disease: Secondary | ICD-10-CM | POA: Diagnosis not present

## 2017-02-16 DIAGNOSIS — L089 Local infection of the skin and subcutaneous tissue, unspecified: Secondary | ICD-10-CM | POA: Diagnosis not present

## 2017-02-16 DIAGNOSIS — E639 Nutritional deficiency, unspecified: Secondary | ICD-10-CM

## 2017-02-16 DIAGNOSIS — Z89421 Acquired absence of other right toe(s): Secondary | ICD-10-CM | POA: Diagnosis not present

## 2017-02-16 DIAGNOSIS — I2510 Atherosclerotic heart disease of native coronary artery without angina pectoris: Secondary | ICD-10-CM | POA: Insufficient documentation

## 2017-02-16 DIAGNOSIS — I10 Essential (primary) hypertension: Secondary | ICD-10-CM

## 2017-02-16 DIAGNOSIS — Z951 Presence of aortocoronary bypass graft: Secondary | ICD-10-CM | POA: Insufficient documentation

## 2017-02-16 DIAGNOSIS — L97519 Non-pressure chronic ulcer of other part of right foot with unspecified severity: Secondary | ICD-10-CM | POA: Diagnosis not present

## 2017-02-16 DIAGNOSIS — Z95810 Presence of automatic (implantable) cardiac defibrillator: Secondary | ICD-10-CM | POA: Insufficient documentation

## 2017-02-16 DIAGNOSIS — T8130XA Disruption of wound, unspecified, initial encounter: Secondary | ICD-10-CM | POA: Diagnosis not present

## 2017-02-16 DIAGNOSIS — Z8673 Personal history of transient ischemic attack (TIA), and cerebral infarction without residual deficits: Secondary | ICD-10-CM | POA: Insufficient documentation

## 2017-02-16 DIAGNOSIS — L89819 Pressure ulcer of head, unspecified stage: Secondary | ICD-10-CM | POA: Diagnosis not present

## 2017-02-16 DIAGNOSIS — M3500 Sicca syndrome, unspecified: Secondary | ICD-10-CM | POA: Insufficient documentation

## 2017-02-16 DIAGNOSIS — E1122 Type 2 diabetes mellitus with diabetic chronic kidney disease: Secondary | ICD-10-CM | POA: Diagnosis not present

## 2017-02-16 DIAGNOSIS — IMO0002 Reserved for concepts with insufficient information to code with codable children: Secondary | ICD-10-CM

## 2017-02-16 DIAGNOSIS — K5900 Constipation, unspecified: Secondary | ICD-10-CM | POA: Insufficient documentation

## 2017-02-16 DIAGNOSIS — Z87891 Personal history of nicotine dependence: Secondary | ICD-10-CM | POA: Insufficient documentation

## 2017-02-16 DIAGNOSIS — E1151 Type 2 diabetes mellitus with diabetic peripheral angiopathy without gangrene: Secondary | ICD-10-CM | POA: Diagnosis not present

## 2017-02-16 DIAGNOSIS — Z9889 Other specified postprocedural states: Secondary | ICD-10-CM | POA: Insufficient documentation

## 2017-02-16 DIAGNOSIS — Z992 Dependence on renal dialysis: Secondary | ICD-10-CM | POA: Insufficient documentation

## 2017-02-16 DIAGNOSIS — E1142 Type 2 diabetes mellitus with diabetic polyneuropathy: Secondary | ICD-10-CM | POA: Insufficient documentation

## 2017-02-16 DIAGNOSIS — E109 Type 1 diabetes mellitus without complications: Secondary | ICD-10-CM | POA: Diagnosis not present

## 2017-02-16 DIAGNOSIS — E11621 Type 2 diabetes mellitus with foot ulcer: Secondary | ICD-10-CM | POA: Diagnosis not present

## 2017-02-16 DIAGNOSIS — R269 Unspecified abnormalities of gait and mobility: Secondary | ICD-10-CM | POA: Insufficient documentation

## 2017-02-16 DIAGNOSIS — Z89512 Acquired absence of left leg below knee: Secondary | ICD-10-CM | POA: Diagnosis present

## 2017-02-16 DIAGNOSIS — I998 Other disorder of circulatory system: Secondary | ICD-10-CM | POA: Insufficient documentation

## 2017-02-16 DIAGNOSIS — L89810 Pressure ulcer of head, unstageable: Secondary | ICD-10-CM

## 2017-02-16 NOTE — Progress Notes (Addendum)
Subjective:    Patient ID: Joshua [name], male    DOB: [birth year], [age] y.o.   MRN: [id number]  HPI 76 y.o. male with history of COPD, T2DM, CAD s/p ICD, ESRD- on PD,  PAD with critical limb ischemia presents for transitional care management after receiving CIR for left BKA and RLE 5th digit amputation.  Admit date: 01/17/2017 Discharge date: 02/07/2017  Presents with wife, who provides the majority of the history. At discharge, patient was instructed to follow up with Dr. Audrie Liauda's office, which he did.  He goes back in 2 week. He saw Dr. Myra GianottiBrabham with plans for further amputation.  He has not seen Nephro yet. He has not seen his PCP either. CBGs have been ~150.  Nephro is following labs. Diet has improved, but is still relatively poor. BP is relatively controlled. Bowel movements are regular with meds. Denies falls.   Therapy: 2/week DME: Awaiting power chair, bedside commode Mobility: Wheelchair at all times.  Pain Inventory Average Pain 3 Pain Right Now 3 My pain is sharp, burning, tingling and aching  In the last 24 hours, has pain interfered with the following? General activity 8 Relation with others 0 Enjoyment of life 10 What TIME of day is your pain at its worst? evening Sleep (in general) Good  Pain is worse with: some activites Pain improves with: rest and medication Relief from Meds: 5  Mobility ability to climb steps?  yes do you drive?  yes use a wheelchair needs help with transfers  Function retired I need assistance with the following:  dressing, bathing, toileting, meal prep, household duties and shopping  Neuro/Psych bowel control problems weakness spasms dizziness confusion loss of taste or smell  Prior Studies Any changes since last visit?  no  Physicians involved in your care Any changes since last visit?  no   Family History  Problem Relation Age of Onset  . Heart disease Father    Social History   Socioeconomic History  . Marital  status: Married    Spouse name: None  . Number of children: None  . Years of education: None  . Highest education level: None  Social Needs  . Financial resource strain: None  . Food insecurity - worry: None  . Food insecurity - inability: None  . Transportation needs - medical: None  . Transportation needs - non-medical: None  Occupational History  . None  Tobacco Use  . Smoking status: Former Games developermoker  . Smokeless tobacco: Never Used  Substance and Sexual Activity  . Alcohol use: No  . Drug use: No  . Sexual activity: No  Other Topics Concern  . None  Social History Narrative  . None   Past Surgical History:  Procedure Laterality Date  . cardiac catherization with stent placement  2017  . CORONARY ARTERY BYPASS GRAFT  2011   Past Medical History:  Diagnosis Date  . AICD (automatic cardioverter/defibrillator) present    [employer] [phone] lead [id number]  . CHF (congestive heart failure) (HCC)   . Complication of anesthesia   . COPD (chronic obstructive pulmonary disease) (HCC)   . Coronary artery disease    CABG 2011  . Diabetes mellitus without complication (HCC)   . ESRD on peritoneal dialysis [facility](HCC)    [place] in [place].   Joshua Kitchen. History of peritonitis    PD cath related peritonitis in April 2017  . Hypertension   . Peripheral vascular disease (  HCC)   . PONV (postoperative nausea and vomiting)   . Sjogren's syndrome (HCC)    Per pt's wife, was diagnosed in 2016 during w/u for cause of renal failure prior to starting dialysis.  He had a rash on his chest apparently prompting this w/u.  Never had renal bx.  He [place] and per the wife he was treated with MTX and other medications but had side effects to "all of it" and isn't taking anything for this as of Jun 2017.   . Stroke (HCC)    BP 108/60   Pulse 88   SpO2 96%   Opioid Risk Score:   Fall Risk Score:  `1  Depression screen  PHQ 2/9  Depression screen PHQ 2/9 02/16/2017  Decreased Interest 2  Down, Depressed, Hopeless 0  PHQ - 2 Score 2  Altered sleeping 3  Tired, decreased energy 3  Change in appetite 2  Feeling bad or failure about yourself  0  Trouble concentrating 0  Moving slowly or fidgety/restless 2  Suicidal thoughts 0  PHQ-9 Score 12  Difficult doing work/chores Somewhat difficult     Review of Systems  Constitutional: Positive for appetite change and unexpected weight change.  HENT: Negative.   Eyes: Negative.   Respiratory: Positive for apnea.   Cardiovascular: Negative.   Gastrointestinal: Positive for constipation, nausea and vomiting.  Endocrine: Negative.   Genitourinary: Positive for dysuria.  Musculoskeletal: Positive for gait problem and joint swelling.  Skin: Positive for rash.  Allergic/Immunologic: Negative.   Hematological: Negative.   Psychiatric/Behavioral: Negative.   All other systems reviewed and are negative.      Objective:   Physical Exam Constitutional: He appears well-nourished. NAD. Obese. HENT: Normocephalic. Areas of abrasion healing, except nose.  Eyes: EOMI. No discharge.  Cardiovascular: RRR. No JVD. Respiratory: CTA Bilaterally. Normal effort. GI: Soft. Bowel sounds are normal.   Musculoskeletal: He exhibits edema and tenderness Left BKA dressed Right 5th digit ampuation Neurological: He is alert.  Motor: 4+/5 grossly throughout  Skin:  BKA incision, right foot incision with dressing Pressure ulcer to nose healing Psychiatric: His speech is normal. He is flat     Assessment & Plan:  76 y.o. male with history of COPD, T2DM, CAD s/p ICD, ESRD- on PD,  PAD with critical limb ischemia presents for transitional care management after receiving CIR for left BKA and RLE 5th digit amputation.  1. Functional deficits secondary to left BKA, RLE 5th digit amputation  Cont therapies  Cont follow up Ortho  Cont follow Vasc  Complete abx  2. Pain  Management  Cont meds  3. Nutrition  Improving, continued to encourage  4. ESRD on PD:   Follow up Nephro- needs appointment  5. Gait abnormality  Cont wheelchair for safety, currently weight bearing through heel only RLE  6. Constipation  Cont bowel reg  7. Pressure ulcer  Nose healing  Meds reviewed Referrals reviewed All questions answered

## 2017-02-22 ENCOUNTER — Telehealth (INDEPENDENT_AMBULATORY_CARE_PROVIDER_SITE_OTHER): Payer: Self-pay | Admitting: Radiology

## 2017-02-22 NOTE — Telephone Encounter (Signed)
Marchelle Folksmanda calling from [facility], she is patients HHRN. She is calling about patients black eschar, increased depth. Denies odor, she states leg looks grungy. There is warmth above the area. Patient finished cipro on Saturday. Dr. Allena KatzPatel however just reordered because patient does have UTI. I tried calling Marchelle Folksmanda back at [phone]. I am working on trying to get patient in the office sooner to see [name]. I did call and speak with Marchelle Folksmanda, also called patients wife Burna MortimerWanda to work out appointment time. Made appointment at 10:00am with [name]. In event patient may need possible revision.

## 2017-02-23 ENCOUNTER — Ambulatory Visit (INDEPENDENT_AMBULATORY_CARE_PROVIDER_SITE_OTHER): Payer: Medicare Other | Admitting: Orthopedic Surgery

## 2017-02-23 ENCOUNTER — Encounter (INDEPENDENT_AMBULATORY_CARE_PROVIDER_SITE_OTHER): Payer: Self-pay | Admitting: Orthopedic Surgery

## 2017-02-23 VITALS — Ht 69.0 in | Wt 145.0 lb

## 2017-02-23 DIAGNOSIS — Z89512 Acquired absence of left leg below knee: Secondary | ICD-10-CM

## 2017-02-23 DIAGNOSIS — I7025 Atherosclerosis of native arteries of other extremities with ulceration: Secondary | ICD-10-CM

## 2017-02-23 DIAGNOSIS — I96 Gangrene, not elsewhere classified: Secondary | ICD-10-CM

## 2017-02-23 HISTORY — DX: Gangrene, not elsewhere classified: I96

## 2017-02-23 MED ORDER — SILVER SULFADIAZINE 1 % EX CREA: 1.0000 "application " | TOPICAL_CREAM | Freq: Every day | CUTANEOUS | 0 refills | Status: DC

## 2017-02-23 NOTE — Progress Notes (Signed)
Office Visit Note   Patient: Joshua [name]           Date of Birth: [birth year]           MRN: [id number] Visit Date: 02/23/2017              Requested by: Dorice Lamashivianathan, Susan S, MD [address] [place], [place] [id number] PCP: Dorice Lamashivianathan, Susan S, MD  Chief Complaint  Patient presents with  . Left Leg - Routine Post Op    Left BKA 01/11/17, 01/20/17      HPI: Patient presents for 2 separate issues #1 status post transtibial amputation of the left #2 status post fifth toe amputation of the right.  Patient and family states that he has progressive ischemic gangrenous changes to the surgical wound area in the right foot he currently has a urinary tract infection being treated with oral antibiotics and has progressive dry gangrenous changes of the medial aspect of the left transtibial amputation.  Assessment & Plan: Visit Diagnoses:  1. History of left below knee amputation (HCC)   2. Gangrene of right foot (HCC)     Plan: Discussed with the patient that we will need to proceed with revision surgery for the left transtibial amputation.  Patient does have a follow-up appointment on Monday with the vascular vein surgeons.  Patient family states that they feel that there are not any other revascularization options to the right lower extremity but they do have a follow-up appointment both with vascular and with follow-up for his urinary tract infection.  They state they would like to proceed with surgical intervention once he is cleared from a vascular and urinary tract infection perspective.  Would plan for revision of a transtibial amputation of the left with a right transtibial amputation.  Patient is on peritoneal dialysis and will need to continue this during the hospital and will need discharge to skilled nursing.  Follow-Up Instructions: Return in about 2 weeks (around 03/09/2017).   Ortho Exam  Patient is alert, oriented, no adenopathy, well-dressed, normal affect,  normal respiratory effort. Examination patient is in a wheelchair.  He has progressive dehiscence of the wound of the left transtibial amputation medially there is dry ischemic changes the wound is 6 cm in diameter and 2 cm deep.  There is no ascending cellulitis no odor no drainage.  Patient's right foot fifth toe amputation is reviewed patient does have progressive ischemic gangrenous changes to the surgical incision.  There is no ascending cellulitis.  Imaging: No results found. No images are attached to the encounter.  Labs: Lab Results  Component Value Date   HGBA1C 8.8 (H) 12/24/2016   HGBA1C 11.0 (H) 11/26/2016   HGBA1C 9.4 (H) 09/22/2015   ESRSEDRATE 123 (H) 11/26/2016   CRP 19.3 (H) 11/26/2016   LABURIC 9.0 (H) 09/21/2015   REPTSTATUS 02/09/2017 FINAL 02/06/2017   GRAMSTAIN  02/06/2017    ABUNDANT WBC PRESENT, PREDOMINANTLY PMN FEW GRAM NEGATIVE RODS    CULT MODERATE ESCHERICHIA COLI 02/06/2017   LABORGA ESCHERICHIA COLI 02/06/2017    Orders:  No orders of the defined types were placed in this encounter.  No orders of the defined types were placed in this encounter.    Procedures: No procedures performed  Clinical Data: No additional findings.  ROS:  All other systems negative, except as noted in the HPI. Review of Systems  Objective: Vital Signs: Ht 5\' 9"  (1.753 m)   Wt 145 lb (65.8 kg)   BMI 21.41  kg/m   Specialty Comments:  No specialty comments available.  PMFS History: Patient Active Problem List   Diagnosis Date Noted  . Gangrene of right foot (HCC) 02/23/2017  . Blood in stool   . Wound dehiscence   . Rupture of operation wound   . Toe amputation status, right (HCC)   . Pressure injury of head, stage 3 (HCC)   . Poor nutrition   . Brittle diabetes (HCC)   . Adjustment disorder with depressed mood   . Hypoglycemia   . Ischemic pain of right foot   . Arterial hypotension   . Diabetes mellitus type 2 in obese (HCC)   . PVD (peripheral  vascular disease) (HCC)   . Delirium   . History of left below knee amputation (HCC) 01/17/2017  . Acute blood loss anemia   . Elevated AST (SGOT) 01/09/2017  . Chest pain 01/09/2017  . NSTEMI (non-ST elevated myocardial infarction) (HCC) 01/09/2017  . Hypokalemia 01/07/2017  . Hypomagnesemia 01/07/2017  . Left foot infection 01/06/2017  . Chronic hypotension 01/06/2017  . 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 IDDM (insulin dependent diabetes mellitus) (HCC) 01/06/2017  . Foot infection 01/06/2017  . Labile blood pressure   . Hallucination   . Benign essential HTN   . Ulcer of toe of right foot (HCC)   . Ulcer of external ear, limited to breakdown of skin (HCC)   . Chronic congestive heart failure (HCC)   . Anemia of chronic disease   . Labile blood glucose   . 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 Diabetic peripheral neuropathy (HCC)   . Debility 12/27/2016  . History of transmetatarsal amputation of left foot (HCC) 12/27/2016  . Constipation   . Chronic obstructive pulmonary disease (HCC)   . Nausea   . ESRD on dialysis (HCC)   . Leukocytosis   . Tachypnea   . FUO (fever of unknown origin)   . Encephalopathy 12/23/2016  . Sore throat 12/23/2016  . Gangrene of left foot (HCC) 11/26/2016  . PAD (peripheral artery disease) (HCC) 11/26/2016  . Sjogren's syndrome (HCC) 09/26/2015  . Uncontrolled type 2 diabetes mellitus with hyperglycemia, with long-term current use of insulin (HCC) 09/26/2015  . Hyperosmolar non-ketotic state in patient with type 2 diabetes mellitus (HCC) 09/25/2015  . Ileus (HCC) 09/22/2015  . Acute respiratory failure with hypoxia (HCC) 09/22/2015  . Acute gouty arthritis 09/22/2015  . CAD (coronary artery disease) of artery bypass graft 09/22/2015  . Sepsis (HCC) 09/21/2015  . Elevated troponin level 09/21/2015  . ESRD on peritoneal dialysis (HCC) 09/21/2015  . CVA (cerebral  infarction) 09/21/2015   Past Medical History:  Diagnosis Date  . AICD (automatic cardioverter/defibrillator) present    [employer] [phone] lead [id number]  . CHF (congestive heart failure) (HCC)   . Complication of anesthesia   . COPD (chronic obstructive pulmonary disease) (HCC)   . Coronary artery disease    CABG 2011  . Diabetes mellitus without complication (HCC)   . ESRD on peritoneal dialysis [facility])    [place] PD Dec 2016 in [place]   Joshua Kitchen History of peritonitis    PD cath related peritonitis in April 2017  . Hypertension   . Peripheral vascular disease (HCC)   . PONV (postoperative nausea and vomiting)   . Sjogren's syndrome (HCC)    Per pt's wife, was diagnosed in 2016 during w/u for cause of renal failure  prior to starting dialysis.  He had a rash on his chest apparently prompting this w/u.  Never had renal bx.  He was [place] in [place] and per the wife he was treated with MTX and other medications but had side effects to "all of it" and isn't taking anything for this as of Jun 2017.   . Stroke [facility](HCC)     Family History  Problem Relation Age of Onset  . Heart disease Father     Past Surgical History:  Procedure Laterality Date  . ABDOMINAL AORTOGRAM W/LOWER EXTREMITY N/A 11/29/2016   Procedure: ABDOMINAL AORTOGRAM W/LOWER EXTREMITY;  Surgeon: Nada LibmanBrabham, Vance W, MD;  [facility] INV[facility] CV LAB;  Service: Cardiovascular;  Laterality: N/A;  . AMPUTATION Left 11/29/2016   Procedure: LEFT THIRD TOE AMPUTATION;  Surgeon: Nada LibmanBrabham, Vance W, MD;  [facility];  Service: Vascular;  Laterality: Left;  . AMPUTATION Left 12/23/2016   Procedure: Left Transmetatarsal Amputation;  Surgeon: [name], Rudie Sermons V, MD;  [facility];  Service: Orthopedics;  Laterality: Left;  . AMPUTATION Left 01/11/2017   Procedure: LEFT BELOW KNEE AMPUTATION;  Surgeon: [name], Aalyah Mansouri V, MD;  [facility];  Service: Orthopedics;  Laterality: Left;  .  AMPUTATION Right 01/20/2017   Procedure: AMPUTATION RIGHT FIFTH TOE;  Surgeon: Nada LibmanBrabham, Vance W, MD;  [facility];  Service: Vascular;  Laterality: Right;  . ANGIOPLASTY  12/15/2016   Procedure: Peyton BottomsBALLOON ANGIOPLASTY;  Surgeon: Maeola Harmanain, Brandon Christopher, MD;  [facility];  Service: Vascular;;  . cardiac catherization with stent placement  2017  . CORONARY ARTERY BYPASS GRAFT  2011  . FALSE ANEURYSM REPAIR Right 12/15/2016   Procedure: REPAIR FALSE ANEURYSM;  Surgeon: Maeola Harmanain, Brandon Christopher, MD;  [facility];  Service: Vascular;  Laterality: Right;  . IRRIGATION AND DEBRIDEMENT FOOT Left 12/15/2016   Procedure: IRRIGATION AND DEBRIDEMENT FOOT;  Surgeon: Maeola Harmanain, Brandon Christopher, MD;  [facility];  Service: Vascular;  Laterality: Left;  . LEFT HEART CATH AND CORS/GRAFTS ANGIOGRAPHY N/A 01/09/2017   Procedure: LEFT HEART CATH AND CORS/GRAFTS ANGIOGRAPHY;  Surgeon: Runell GessBerry, Jonathan J, MD;  [facility];  Service: Cardiovascular;  Laterality: N/A;  . LOWER EXTREMITY ANGIOGRAM Left 12/15/2016   Procedure: LOWER EXTREMITY ANGIOGRAM; PEDAL ACCESS;  Surgeon: Maeola Harmanain, Brandon Christopher, MD;  [facility];  Service: Vascular;  Laterality: Left;  . PERIPHERAL VASCULAR BALLOON ANGIOPLASTY  11/29/2016   Procedure: PERIPHERAL VASCULAR BALLOON ANGIOPLASTY;  Surgeon: Nada LibmanBrabham, Vance W, MD;  [facility]: [facility]VAS[facility] LAB;  Service: Cardiovascular;;  LT Peroneal AT attempted unsuccessful   Social History   Occupational History  . Not on file  Tobacco Use  . Smoking status: Former Games developermoker  . Smokeless tobacco: Never Used  Substance and Sexual Activity  . Alcohol use: No  . Drug use: No  . Sexual activity: No

## 2017-02-23 NOTE — Addendum Note (Signed)
Addended by: Donalee CitrinPEELE, Quorra Rosene L on: 02/23/2017 11:33 AM   Modules accepted: Orders

## 2017-02-27 ENCOUNTER — Encounter: Payer: Self-pay | Admitting: Family

## 2017-02-27 ENCOUNTER — Ambulatory Visit (INDEPENDENT_AMBULATORY_CARE_PROVIDER_SITE_OTHER): Payer: Medicare Other | Admitting: Family

## 2017-02-27 VITALS — BP 124/72 | HR 105 | Temp 99.0°F | Resp 17 | Wt 145.0 lb

## 2017-02-27 DIAGNOSIS — E1151 Type 2 diabetes mellitus with diabetic peripheral angiopathy without gangrene: Secondary | ICD-10-CM

## 2017-02-27 DIAGNOSIS — E1165 Type 2 diabetes mellitus with hyperglycemia: Secondary | ICD-10-CM

## 2017-02-27 DIAGNOSIS — Z89512 Acquired absence of left leg below knee: Secondary | ICD-10-CM

## 2017-02-27 DIAGNOSIS — I7025 Atherosclerosis of native arteries of other extremities with ulceration: Secondary | ICD-10-CM

## 2017-02-27 DIAGNOSIS — T8789 Other complications of amputation stump: Secondary | ICD-10-CM

## 2017-02-27 DIAGNOSIS — IMO0002 Reserved for concepts with insufficient information to code with codable children: Secondary | ICD-10-CM

## 2017-02-27 NOTE — Progress Notes (Signed)
Postoperative Visit   History of Present Illness  [name] is a 76 y.o. male who is s/p CSI orbital atherectomy of left AT with 1.5 diamondback, balloon angioplasty of left AT with 3mm balloon, Left DP with 2.515mm balloon, and resection of left 3rd metatarsal head on 12-15-16 by [name]. He is also s/p left transtibial amputation on 01-11-17 by [name] after transmetatarsal amputation with progressive dehiscence and necrosis to the residual limb. He is then s/p amputation right fifth toe including metatarsal head on 01-20-17 by Dr. Myra GianottiBrabham for infected right fifth toe.  Pt states HH comes three times/week, wet to dry NS dressing to right 5th toe amputation site. Pt and wife state that the right foot looks worse.  I last evaluated pt on 02-13-17. At that time doppler signals fairly brisk in right foot DP, PT, and peroneal.  Right foot was ruddy, toes were moderately edematous. Right 5th toe amputation site incision edges were separated. (see photos from that visit). Right groin with small opening, wicking dressing in place, evidence of contraction and healing of wound, no evidence of infection. Dr. Myra GianottiBrabham spoke with pt and wife and examined pt.  Impression is that right foot is becoming ischemic, but can give it time to see if the wound will show signs of healing.  Sutures left intact.  Continue Cipro as prescribed.  Continue dressing changes to right groin and [facility].  Pt was to follow up in 2 weeks for right foot wound check. Pt and wife advised to notify us if right foot seems to be worse.  A1C was 8.8 on 12-24-16.  He is a former smoker.  Pt denies fever or chills. He states he is allergic to sulfa and vancomycin, not allergic to PCN.    The patient notes pain is adequately controlled with Tylenol and occasional oxycodone.   Hw saw [name] last week, has scheduled a right BKA along with debridement of left left BKA stump.   For VQI Use  Only  PRE-ADM LIVING: Home  AMB STATUS: Wheelchair   Past Medical History:  Diagnosis Date  . AICD (automatic cardioverter/defibrillator) present    [employer] [id number] lead [id number]  . CHF (congestive heart failure) (HCC)   . Complication of anesthesia   . COPD (chronic obstructive pulmonary disease) (HCC)   . Coronary artery disease    CABG 2011  . Diabetes mellitus without complication (HCC)   . ESRD on peritoneal dialysis [facility](HCC)    [place] PD [place]rology [place] in [place].   [name]. History of peritonitis    PD cath related peritonitis in April 2017  . Hypertension   . Peripheral vascular disease (HCC)   . PONV (postoperative nausea and vomiting)   . Sjogren's syndrome (HCC)    Per pt's wife, was diagnosed in 2016 during w/u for cause of renal failure prior to starting dialysis.  He had a rash on his chest apparently prompting this w/u.  Never had renal bx.  He was referred to [place]um [place] in [place] and per the wife he was treated with MTX and other medications but had side effects to "all of it" and isn't taking anything for this as of Jun 2017.   . Stroke [facility](HCC)     Past Surgical History:  Procedure Laterality Date  . ABDOMINAL AORTOGRAM W/LOWER EXTREMITY N/A 11/29/2016   Performed by Nada LibmanBrabham, Vance W, MD at [facility]  INVASIVE CV LAB  . AMPUTATION RIGHT FIFTH TOE Right 01/20/2017   Performed by Nada Libman, MD at [facility] OR  . ATHERECTOMY USING DIAMONDBACK 360 Left 12/15/2016   Performed by Maeola Harman, MD at [facility] OR  . BALLOON ANGIOPLASTY  12/15/2016   Performed by Maeola Harman, MD at [facility] OR  . cardiac catherization with stent placement  2017  . CORONARY ARTERY BYPASS GRAFT  2011  . IRRIGATION AND DEBRIDEMENT FOOT Left 12/15/2016   Performed by Maeola Harman, MD at [facility] OR  . LEFT BELOW KNEE AMPUTATION Left 01/11/2017   Performed by Nadara Mustard, MD at [facility] OR  . LEFT HEART CATH AND CORS/GRAFTS ANGIOGRAPHY N/A  01/09/2017   Performed by Runell Gess, MD at [facility] INVASIVE CV LAB  . LEFT THIRD TOE AMPUTATION Left 11/29/2016   Performed by Nada Libman, MD at [facility] OR  . Left Transmetatarsal Amputation Left 12/23/2016   Performed by Nadara Mustard, MD at [facility] OR  . LOWER EXTREMITY ANGIOGRAM; PEDAL ACCESS Left 12/15/2016   Performed by Maeola Harman, MD at [facility] OR  . PERIPHERAL VASCULAR BALLOON ANGIOPLASTY  11/29/2016   Performed by Nada Libman, MD at [facility] INVASIVE CV LAB  . REPAIR FALSE ANEURYSM Right 12/15/2016   Performed by Maeola Harman, MD at [facility] OR    Social History   Socioeconomic History  . Marital status: Married    Spouse name: Not on file  . Number of children: Not on file  . Years of education: Not on file  . Highest education level: Not on file  Social Needs  . Financial resource strain: Not on file  . Food insecurity - worry: Not on file  . Food insecurity - inability: Not on file  . Transportation needs - medical: Not on file  . Transportation needs - non-medical: Not on file  Occupational History  . Not on file  Tobacco Use  . Smoking status: Former Games developer  . Smokeless tobacco: Never Used  Substance and Sexual Activity  . Alcohol use: No  . Drug use: No  . Sexual activity: No  Other Topics Concern  . Not on file  Social History Narrative  . Not on file    Allergies  Allergen Reactions  . Gabapentin (Once-Daily) Hypertension  . Lisinopril Hives  . Methotrexate Derivatives Other (See Comments)    blisters  . Sulfa Antibiotics Hives  . Tape Hives  . Vancomycin Other (See Comments)    Blisters   . Zanaflex [Tizanidine Hcl]     Hypotensive stroke    Current Outpatient Medications on File Prior to Visit  Medication Sig Dispense Refill  . aspirin EC 81 MG tablet Take 81 mg by mouth daily.    [name] atorvastatin (LIPITOR) 80 MG tablet Take 1 tablet (80 mg total) by mouth daily at 6 PM. 30 tablet 0  . calcium acetate (PHOSLO) 667 MG capsule Take  1 capsule (667 mg total) by mouth 3 (three) times daily with meals. 90 capsule 0  . carvedilol (COREG) 6.25 MG tablet Take 1 tablet (6.25 mg total) by mouth at bedtime. 30 tablet 0  . ciprofloxacin (CIPRO) 250 MG tablet Take 1 tablet (250 mg total) by mouth 2 (two) times daily. 20 tablet 0  . clopidogrel (PLAVIX) 75 MG tablet Take 1 tablet (75 mg total) by mouth daily. 30 tablet 0  . cyclobenzaprine (FLEXERIL) 10 MG tablet Take 1 tablet (10 mg total) 2 (two)  times daily as needed by mouth for muscle spasms. 30 tablet 0  . docusate sodium (COLACE) 100 MG capsule Take 100 mg by mouth 2 (two) times daily.     . insulin NPH Human (HUMULIN N,NOVOLIN N) 100 UNIT/ML injection Inject 0.1 mLs (10 Units total) into the skin daily before breakfast. 10 mL 11  . insulin NPH Human (HUMULIN N,NOVOLIN N) 100 UNIT/ML injection Inject 0.65 mLs (65 Units total) into the skin at bedtime. 10 mL 11  . multivitamin (RENA-VIT) TABS tablet Take 1 tablet by mouth at bedtime.  0  . nitroGLYCERIN (NITROSTAT) 0.4 MG SL tablet Place 0.4 mg under the tongue every 5 (five) minutes as needed for chest pain.    [name]. oxyCODONE (OXY IR/ROXICODONE) 5 MG immediate release tablet Take 0.5 tablets (2.5 mg total) by mouth every 6 (six) hours as needed for severe pain. 20 tablet 0  . polyethylene glycol (MIRALAX / GLYCOLAX) packet Take 17 g by mouth daily.    [name]. RELION INSULIN SYRINGE 31G X 15/64" 1 ML MISC     . silver sulfADIAZINE (SILVADENE) 1 % cream Apply 1 application daily topically. 50 g 0  . Vitamin D, Ergocalciferol, (DRISDOL) 50000 units CAPS capsule Take 50,000 Units by mouth every 30 (thirty) days.      No current facility-administered medications on file prior to visit.      Physical Examination  Vitals:   02/27/17 1309  BP: 124/72  Pulse: (!) 105  Resp: 17  Temp: 99 F (37.2 C)  TempSrc: Oral  SpO2: 96%  Weight: 145 lb (65.8 kg)   Body mass index is 21.41 kg/m.    Right groin    Right foot, dorsal/medial  view    Right foot, plantar view    Right foot, lateral view    Right 5th toe amputation site is becoming gangrenous, right toes and dorsal aspect of right foot are ischemic. See above photos.  Right groin wound has contracted to the size of a head of a pin, depth probed with sterile cotton swab (smaller stick end), no erythema, no drainage.  Right groin wound has contracted to the size of a pin head, depth is 2 cm, no drainage, no signs of infection at the right groin.   Medical Decision Making  [name] is a 76 y.o. male who is s/p CSI orbital atherectomy of left AT with 1.5 diamondback, balloon angioplasty of left AT with 3mm balloon, Left DP with 2.685mm balloon, and resection of left 3rd metatarsal head on 12-15-16 by [name]. He is also s/p left transtibial amputation on 01-11-17 by [name] after transmetatarsal amputation with progressive dehiscence and necrosis to the residual limb. He is then s/p amputation right fifth toe including metatarsal head on 01-20-17 by Dr. Myra GianottiBrabham for infected right fifth toe.  Continue with daily right groin ribbon gauze packing, right foot and left BKA stump dressing changes per HH.   Dr. Myra GianottiBrabham spoke with pt and wife, and examined pt.  Pt is scheduled for right BKA on 03-01-17, and left BKA stump wound debridement by [name], Dr. Myra GianottiBrabham agrees with this, pt is in agreement with this.    The patient can follow up with us as needed.  Ceonna Frazzini, Carma LairSUZANNE L, RN, MSN, FNP-C [facility] Office: [phone]  02/27/2017, 1:22 PM  Clinic MD: Myra GianottiBrabham

## 2017-02-28 ENCOUNTER — Other Ambulatory Visit: Payer: Self-pay

## 2017-02-28 ENCOUNTER — Other Ambulatory Visit (INDEPENDENT_AMBULATORY_CARE_PROVIDER_SITE_OTHER): Payer: Self-pay | Admitting: Orthopedic Surgery

## 2017-02-28 ENCOUNTER — Encounter (HOSPITAL_COMMUNITY): Payer: Self-pay | Admitting: *Deleted

## 2017-02-28 ENCOUNTER — Ambulatory Visit (INDEPENDENT_AMBULATORY_CARE_PROVIDER_SITE_OTHER): Payer: Medicare Other | Admitting: Orthopedic Surgery

## 2017-02-28 DIAGNOSIS — T8130XA Disruption of wound, unspecified, initial encounter: Secondary | ICD-10-CM

## 2017-02-28 DIAGNOSIS — I70202 Unspecified atherosclerosis of native arteries of extremities, left leg: Secondary | ICD-10-CM

## 2017-02-28 DIAGNOSIS — I96 Gangrene, not elsewhere classified: Principal | ICD-10-CM

## 2017-02-28 NOTE — Progress Notes (Signed)
Pt denies any acute cardiopulmonary issues. Pt under the care of [facility]. Peri-op Prescription for ICD was faxed to [facility] (may need ROI consent form signed DOS). Spoke with Joey (Rep), from [employer] to make aware of upcoming procedure and that I will f/u with him once cardiologist completes form for ICD and faxes it back to me. Joey's # is on pt chart. Pt made aware to stop taking vitamins, fish oil  herbal medications and NSAIDs. Pt to bring mask for BIPAP and tubing DOS. Pt verbalized understanding of all pre-op instructions. Reviewed pt history with Joslyn HyA. Kabbe, NP, Anesthesia, no new orders given.

## 2017-03-01 ENCOUNTER — Inpatient Hospital Stay (HOSPITAL_COMMUNITY)
Admission: RE | Admit: 2017-03-01 | Discharge: 2017-03-06 | DRG: 474 | Disposition: A | Discharge status: Rehabilitation Facility | Payer: Medicare Other | Source: Ambulatory Visit | Attending: Orthopedic Surgery | Admitting: Orthopedic Surgery

## 2017-03-01 ENCOUNTER — Inpatient Hospital Stay (HOSPITAL_COMMUNITY): Payer: Medicare Other | Admitting: Anesthesiology

## 2017-03-01 ENCOUNTER — Encounter (HOSPITAL_COMMUNITY)
Admission: RE | Disposition: A | Discharge status: Rehabilitation Facility | Payer: Self-pay | Source: Ambulatory Visit | Attending: Orthopedic Surgery

## 2017-03-01 ENCOUNTER — Encounter (HOSPITAL_COMMUNITY): Payer: Self-pay

## 2017-03-01 DIAGNOSIS — E1165 Type 2 diabetes mellitus with hyperglycemia: Secondary | ICD-10-CM

## 2017-03-01 DIAGNOSIS — J449 Chronic obstructive pulmonary disease, unspecified: Secondary | ICD-10-CM | POA: Diagnosis present

## 2017-03-01 DIAGNOSIS — E785 Hyperlipidemia, unspecified: Secondary | ICD-10-CM

## 2017-03-01 DIAGNOSIS — N390 Urinary tract infection, site not specified: Secondary | ICD-10-CM | POA: Diagnosis present

## 2017-03-01 DIAGNOSIS — G4730 Sleep apnea, unspecified: Secondary | ICD-10-CM | POA: Diagnosis present

## 2017-03-01 DIAGNOSIS — Z794 Long term (current) use of insulin: Secondary | ICD-10-CM

## 2017-03-01 DIAGNOSIS — Z95810 Presence of automatic (implantable) cardiac defibrillator: Secondary | ICD-10-CM

## 2017-03-01 DIAGNOSIS — I509 Heart failure, unspecified: Secondary | ICD-10-CM | POA: Diagnosis present

## 2017-03-01 DIAGNOSIS — E1122 Type 2 diabetes mellitus with diabetic chronic kidney disease: Secondary | ICD-10-CM | POA: Diagnosis present

## 2017-03-01 DIAGNOSIS — Z79899 Other long term (current) drug therapy: Secondary | ICD-10-CM

## 2017-03-01 DIAGNOSIS — R195 Other fecal abnormalities: Secondary | ICD-10-CM | POA: Diagnosis not present

## 2017-03-01 DIAGNOSIS — R6510 Systemic inflammatory response syndrome (SIRS) of non-infectious origin without acute organ dysfunction: Secondary | ICD-10-CM | POA: Diagnosis present

## 2017-03-01 DIAGNOSIS — I96 Gangrene, not elsewhere classified: Secondary | ICD-10-CM

## 2017-03-01 DIAGNOSIS — I10 Essential (primary) hypertension: Secondary | ICD-10-CM | POA: Diagnosis not present

## 2017-03-01 DIAGNOSIS — D631 Anemia in chronic kidney disease: Secondary | ICD-10-CM | POA: Diagnosis present

## 2017-03-01 DIAGNOSIS — I959 Hypotension, unspecified: Secondary | ICD-10-CM | POA: Diagnosis present

## 2017-03-01 DIAGNOSIS — E440 Moderate protein-calorie malnutrition: Secondary | ICD-10-CM | POA: Diagnosis present

## 2017-03-01 DIAGNOSIS — K5900 Constipation, unspecified: Secondary | ICD-10-CM | POA: Diagnosis present

## 2017-03-01 DIAGNOSIS — Z4781 Encounter for orthopedic aftercare following surgical amputation: Secondary | ICD-10-CM | POA: Diagnosis not present

## 2017-03-01 DIAGNOSIS — K219 Gastro-esophageal reflux disease without esophagitis: Secondary | ICD-10-CM | POA: Diagnosis present

## 2017-03-01 DIAGNOSIS — E109 Type 1 diabetes mellitus without complications: Secondary | ICD-10-CM | POA: Diagnosis not present

## 2017-03-01 DIAGNOSIS — D62 Acute posthemorrhagic anemia: Secondary | ICD-10-CM | POA: Diagnosis not present

## 2017-03-01 DIAGNOSIS — I132 Hypertensive heart and chronic kidney disease with heart failure and with stage 5 chronic kidney disease, or end stage renal disease: Secondary | ICD-10-CM | POA: Diagnosis not present

## 2017-03-01 DIAGNOSIS — I5041 Acute combined systolic (congestive) and diastolic (congestive) heart failure: Secondary | ICD-10-CM | POA: Diagnosis not present

## 2017-03-01 DIAGNOSIS — Z8673 Personal history of transient ischemic attack (TIA), and cerebral infarction without residual deficits: Secondary | ICD-10-CM

## 2017-03-01 DIAGNOSIS — L97519 Non-pressure chronic ulcer of other part of right foot with unspecified severity: Secondary | ICD-10-CM | POA: Diagnosis present

## 2017-03-01 DIAGNOSIS — N2581 Secondary hyperparathyroidism of renal origin: Secondary | ICD-10-CM | POA: Diagnosis not present

## 2017-03-01 DIAGNOSIS — E876 Hypokalemia: Secondary | ICD-10-CM | POA: Diagnosis present

## 2017-03-01 DIAGNOSIS — I429 Cardiomyopathy, unspecified: Secondary | ICD-10-CM | POA: Diagnosis not present

## 2017-03-01 DIAGNOSIS — I471 Supraventricular tachycardia: Secondary | ICD-10-CM | POA: Diagnosis not present

## 2017-03-01 DIAGNOSIS — Z992 Dependence on renal dialysis: Secondary | ICD-10-CM | POA: Diagnosis not present

## 2017-03-01 DIAGNOSIS — Y835 Amputation of limb(s) as the cause of abnormal reaction of the patient, or of later complication, without mention of misadventure at the time of the procedure: Secondary | ICD-10-CM | POA: Diagnosis present

## 2017-03-01 DIAGNOSIS — N186 End stage renal disease: Secondary | ICD-10-CM | POA: Diagnosis not present

## 2017-03-01 DIAGNOSIS — I5022 Chronic systolic (congestive) heart failure: Secondary | ICD-10-CM | POA: Diagnosis present

## 2017-03-01 DIAGNOSIS — Z951 Presence of aortocoronary bypass graft: Secondary | ICD-10-CM

## 2017-03-01 DIAGNOSIS — E1152 Type 2 diabetes mellitus with diabetic peripheral angiopathy with gangrene: Secondary | ICD-10-CM | POA: Diagnosis present

## 2017-03-01 DIAGNOSIS — Z89511 Acquired absence of right leg below knee: Secondary | ICD-10-CM | POA: Diagnosis not present

## 2017-03-01 DIAGNOSIS — E11621 Type 2 diabetes mellitus with foot ulcer: Secondary | ICD-10-CM | POA: Diagnosis present

## 2017-03-01 DIAGNOSIS — I739 Peripheral vascular disease, unspecified: Secondary | ICD-10-CM

## 2017-03-01 DIAGNOSIS — I2510 Atherosclerotic heart disease of native coronary artery without angina pectoris: Secondary | ICD-10-CM | POA: Diagnosis present

## 2017-03-01 DIAGNOSIS — M3500 Sicca syndrome, unspecified: Secondary | ICD-10-CM | POA: Diagnosis present

## 2017-03-01 DIAGNOSIS — E1142 Type 2 diabetes mellitus with diabetic polyneuropathy: Secondary | ICD-10-CM | POA: Diagnosis not present

## 2017-03-01 DIAGNOSIS — A419 Sepsis, unspecified organism: Secondary | ICD-10-CM

## 2017-03-01 DIAGNOSIS — Z881 Allergy status to other antibiotic agents status: Secondary | ICD-10-CM

## 2017-03-01 DIAGNOSIS — D638 Anemia in other chronic diseases classified elsewhere: Secondary | ICD-10-CM | POA: Diagnosis not present

## 2017-03-01 DIAGNOSIS — E46 Unspecified protein-calorie malnutrition: Secondary | ICD-10-CM | POA: Diagnosis not present

## 2017-03-01 DIAGNOSIS — E43 Unspecified severe protein-calorie malnutrition: Secondary | ICD-10-CM | POA: Diagnosis not present

## 2017-03-01 DIAGNOSIS — K5903 Drug induced constipation: Secondary | ICD-10-CM

## 2017-03-01 DIAGNOSIS — Z89512 Acquired absence of left leg below knee: Secondary | ICD-10-CM | POA: Diagnosis not present

## 2017-03-01 DIAGNOSIS — T8130XA Disruption of wound, unspecified, initial encounter: Secondary | ICD-10-CM

## 2017-03-01 DIAGNOSIS — I70202 Unspecified atherosclerosis of native arteries of extremities, left leg: Secondary | ICD-10-CM

## 2017-03-01 DIAGNOSIS — T8781 Dehiscence of amputation stump: Principal | ICD-10-CM | POA: Diagnosis present

## 2017-03-01 DIAGNOSIS — I5032 Chronic diastolic (congestive) heart failure: Secondary | ICD-10-CM | POA: Diagnosis not present

## 2017-03-01 DIAGNOSIS — I248 Other forms of acute ischemic heart disease: Secondary | ICD-10-CM | POA: Diagnosis not present

## 2017-03-01 DIAGNOSIS — Z6831 Body mass index (BMI) 31.0-31.9, adult: Secondary | ICD-10-CM | POA: Diagnosis not present

## 2017-03-01 DIAGNOSIS — D72829 Elevated white blood cell count, unspecified: Secondary | ICD-10-CM | POA: Diagnosis not present

## 2017-03-01 DIAGNOSIS — G4733 Obstructive sleep apnea (adult) (pediatric): Secondary | ICD-10-CM | POA: Diagnosis present

## 2017-03-01 DIAGNOSIS — Z87891 Personal history of nicotine dependence: Secondary | ICD-10-CM | POA: Diagnosis not present

## 2017-03-01 DIAGNOSIS — E1140 Type 2 diabetes mellitus with diabetic neuropathy, unspecified: Secondary | ICD-10-CM | POA: Diagnosis present

## 2017-03-01 DIAGNOSIS — R410 Disorientation, unspecified: Secondary | ICD-10-CM | POA: Diagnosis not present

## 2017-03-01 HISTORY — PX: AMPUTATION: SHX166

## 2017-03-01 HISTORY — DX: Disruption of wound, unspecified, initial encounter: T81.30XA

## 2017-03-01 HISTORY — PX: STUMP REVISION: SHX6102

## 2017-03-01 HISTORY — DX: Gastro-esophageal reflux disease without esophagitis: K21.9

## 2017-03-01 HISTORY — DX: Gangrene, not elsewhere classified: I96

## 2017-03-01 HISTORY — DX: Sleep apnea, unspecified: G47.30

## 2017-03-01 LAB — COMPREHENSIVE METABOLIC PANEL
ALT: 18 U/L (ref 17–63)
ANION GAP: 11 (ref 5–15)
AST: 38 U/L (ref 15–41)
Albumin: 1.8 g/dL — ABNORMAL LOW (ref 3.5–5.0)
Alkaline Phosphatase: 86 U/L (ref 38–126)
BUN: 28 mg/dL — ABNORMAL HIGH (ref 6–20)
CHLORIDE: 99 mmol/L — AB (ref 101–111)
CO2: 25 mmol/L (ref 22–32)
Calcium: 9.2 mg/dL (ref 8.9–10.3)
Creatinine, Ser: 4.88 mg/dL — ABNORMAL HIGH (ref 0.61–1.24)
GFR, EST AFRICAN AMERICAN: 12 mL/min — AB (ref >60)
GFR, EST NON AFRICAN AMERICAN: 11 mL/min — AB (ref >60)
Glucose, Bld: 196 mg/dL — ABNORMAL HIGH (ref 65–99)
POTASSIUM: 3.7 mmol/L (ref 3.5–5.1)
Sodium: 135 mmol/L (ref 135–145)
Total Bilirubin: 0.9 mg/dL (ref 0.3–1.2)
Total Protein: 5.7 g/dL — ABNORMAL LOW (ref 6.5–8.1)

## 2017-03-01 LAB — CBC
HCT: 34.4 % — ABNORMAL LOW (ref 39.0–52.0)
Hemoglobin: 11.1 g/dL — ABNORMAL LOW (ref 13.0–17.0)
MCH: 30.2 pg (ref 26.0–34.0)
MCHC: 32.3 g/dL (ref 30.0–36.0)
MCV: 93.5 fL (ref 78.0–100.0)
PLATELETS: 367 10*3/uL (ref 150–400)
RBC: 3.68 MIL/uL — ABNORMAL LOW (ref 4.22–5.81)
RDW: 15 % (ref 11.5–15.5)
WBC: 10.8 10*3/uL — AB (ref 4.0–10.5)

## 2017-03-01 LAB — GLUCOSE, CAPILLARY
GLUCOSE-CAPILLARY: 158 mg/dL — AB (ref 65–99)
Glucose-Capillary: 185 mg/dL — ABNORMAL HIGH (ref 65–99)
Glucose-Capillary: 98 mg/dL (ref 65–99)
Glucose-Capillary: 88 mg/dL (ref 65–99)
Glucose-Capillary: 121 mg/dL — ABNORMAL HIGH (ref 65–99)

## 2017-03-01 LAB — PROTIME-INR
INR: 1.07
PROTHROMBIN TIME: 13.8 s (ref 11.4–15.2)

## 2017-03-01 LAB — APTT: aPTT: 36 seconds (ref 24–36)

## 2017-03-01 SURGERY — REVISION, AMPUTATION SITE
Anesthesia: General | Laterality: Right

## 2017-03-01 MED ORDER — DEXAMETHASONE SODIUM PHOSPHATE 10 MG/ML IJ SOLN
INTRAMUSCULAR | Status: AC
  Filled 2017-03-01: qty 1

## 2017-03-01 MED ORDER — RENA-VITE PO TABS
1.0000 | ORAL_TABLET | Freq: Every day | ORAL | Status: DC
  Administered 2017-03-01 – 2017-03-05 (×4): 1 via ORAL
  Filled 2017-03-01 (×4): qty 1

## 2017-03-01 MED ORDER — ONDANSETRON HCL 4 MG/2ML IJ SOLN
INTRAMUSCULAR | Status: DC | PRN
  Administered 2017-03-01: 4 mg via INTRAVENOUS

## 2017-03-01 MED ORDER — LIDOCAINE 2% (20 MG/ML) 5 ML SYRINGE
INTRAMUSCULAR | Status: DC | PRN
  Administered 2017-03-01: 60 mg via INTRAVENOUS

## 2017-03-01 MED ORDER — GENTAMICIN SULFATE 0.1 % EX CREA
1.0000 "application " | TOPICAL_CREAM | Freq: Every day | CUTANEOUS | Status: DC
  Administered 2017-03-01 – 2017-03-04 (×4): 1 via TOPICAL
  Filled 2017-03-01: qty 15

## 2017-03-01 MED ORDER — CALCIUM ACETATE (PHOS BINDER) 667 MG PO CAPS
667.0000 mg | ORAL_CAPSULE | Freq: Three times a day (TID) | ORAL | Status: DC
  Administered 2017-03-02 – 2017-03-03 (×4): 667 mg via ORAL
  Filled 2017-03-01 (×6): qty 1

## 2017-03-01 MED ORDER — OXYCODONE HCL 5 MG PO TABS
5.0000 mg | ORAL_TABLET | ORAL | Status: DC | PRN
  Administered 2017-03-02: 5 mg via ORAL
  Filled 2017-03-01: qty 1

## 2017-03-01 MED ORDER — EPHEDRINE SULFATE 50 MG/ML IJ SOLN
INTRAMUSCULAR | Status: DC | PRN
  Administered 2017-03-01: 5 mg via INTRAVENOUS
  Administered 2017-03-01: 10 mg via INTRAVENOUS

## 2017-03-01 MED ORDER — FENTANYL CITRATE (PF) 250 MCG/5ML IJ SOLN
INTRAMUSCULAR | Status: AC
  Filled 2017-03-01: qty 5

## 2017-03-01 MED ORDER — METHOCARBAMOL 1000 MG/10ML IJ SOLN
500.0000 mg | Freq: Four times a day (QID) | INTRAVENOUS | Status: DC | PRN
  Filled 2017-03-01: qty 5

## 2017-03-01 MED ORDER — CEFAZOLIN SODIUM-DEXTROSE 1-4 GM/50ML-% IV SOLN
1.0000 g | Freq: Once | INTRAVENOUS | Status: AC
  Administered 2017-03-01: 1 g via INTRAVENOUS
  Filled 2017-03-01: qty 50

## 2017-03-01 MED ORDER — DOCUSATE SODIUM 100 MG PO CAPS
100.0000 mg | ORAL_CAPSULE | Freq: Two times a day (BID) | ORAL | Status: DC
  Administered 2017-03-01 – 2017-03-06 (×10): 100 mg via ORAL
  Filled 2017-03-01 (×10): qty 1

## 2017-03-01 MED ORDER — METHOCARBAMOL 1000 MG/10ML IJ SOLN
500.0000 mg | INTRAVENOUS | Status: AC
  Administered 2017-03-01: 500 mg via INTRAVENOUS
  Filled 2017-03-01: qty 5

## 2017-03-01 MED ORDER — INSULIN ASPART 100 UNIT/ML ~~LOC~~ SOLN
0.0000 [IU] | Freq: Three times a day (TID) | SUBCUTANEOUS | Status: DC
  Administered 2017-03-02: 5 [IU] via SUBCUTANEOUS
  Administered 2017-03-02: 2 [IU] via SUBCUTANEOUS
  Administered 2017-03-02: 5 [IU] via SUBCUTANEOUS
  Administered 2017-03-03: 9 [IU] via SUBCUTANEOUS
  Administered 2017-03-03: 2 [IU] via SUBCUTANEOUS
  Administered 2017-03-03: 7 [IU] via SUBCUTANEOUS
  Administered 2017-03-04 (×2): 9 [IU] via SUBCUTANEOUS
  Administered 2017-03-04: 5 [IU] via SUBCUTANEOUS
  Administered 2017-03-05: 7 [IU] via SUBCUTANEOUS
  Administered 2017-03-05: 9 [IU] via SUBCUTANEOUS
  Administered 2017-03-05: 3 [IU] via SUBCUTANEOUS
  Administered 2017-03-06: 5 [IU] via SUBCUTANEOUS
  Administered 2017-03-06 (×2): 9 [IU] via SUBCUTANEOUS

## 2017-03-01 MED ORDER — ONDANSETRON HCL 4 MG/2ML IJ SOLN
INTRAMUSCULAR | Status: AC
  Filled 2017-03-01: qty 2

## 2017-03-01 MED ORDER — OXYCODONE HCL 5 MG PO TABS
10.0000 mg | ORAL_TABLET | ORAL | Status: DC | PRN
  Administered 2017-03-01 – 2017-03-06 (×6): 10 mg via ORAL
  Filled 2017-03-01 (×6): qty 2

## 2017-03-01 MED ORDER — FENTANYL CITRATE (PF) 250 MCG/5ML IJ SOLN
INTRAMUSCULAR | Status: DC | PRN
  Administered 2017-03-01: 25 ug via INTRAVENOUS
  Administered 2017-03-01: 50 ug via INTRAVENOUS

## 2017-03-01 MED ORDER — ATORVASTATIN CALCIUM 80 MG PO TABS
80.0000 mg | ORAL_TABLET | Freq: Every day | ORAL | Status: DC
  Administered 2017-03-01 – 2017-03-06 (×6): 80 mg via ORAL
  Filled 2017-03-01 (×6): qty 1

## 2017-03-01 MED ORDER — SODIUM CHLORIDE 0.9 % IV SOLN
INTRAVENOUS | Status: DC
  Administered 2017-03-01: 10:00:00 via INTRAVENOUS

## 2017-03-01 MED ORDER — ONDANSETRON HCL 4 MG/2ML IJ SOLN
4.0000 mg | Freq: Four times a day (QID) | INTRAMUSCULAR | Status: DC | PRN
  Administered 2017-03-01: 4 mg via INTRAVENOUS

## 2017-03-01 MED ORDER — LIDOCAINE 2% (20 MG/ML) 5 ML SYRINGE
INTRAMUSCULAR | Status: AC
  Filled 2017-03-01: qty 5

## 2017-03-01 MED ORDER — PHENYLEPHRINE HCL 10 MG/ML IJ SOLN
INTRAMUSCULAR | Status: DC | PRN
  Administered 2017-03-01: 120 ug via INTRAVENOUS
  Administered 2017-03-01 (×4): 80 ug via INTRAVENOUS
  Administered 2017-03-01: 120 ug via INTRAVENOUS
  Administered 2017-03-01: 80 ug via INTRAVENOUS
  Administered 2017-03-01: 120 ug via INTRAVENOUS

## 2017-03-01 MED ORDER — ASPIRIN EC 81 MG PO TBEC
81.0000 mg | DELAYED_RELEASE_TABLET | Freq: Every day | ORAL | Status: DC
  Administered 2017-03-02 – 2017-03-06 (×5): 81 mg via ORAL
  Filled 2017-03-01 (×5): qty 1

## 2017-03-01 MED ORDER — FENTANYL CITRATE (PF) 100 MCG/2ML IJ SOLN
INTRAMUSCULAR | Status: AC
  Administered 2017-03-01: 25 ug via INTRAVENOUS
  Filled 2017-03-01: qty 2

## 2017-03-01 MED ORDER — OXYCODONE HCL 5 MG PO TABS
ORAL_TABLET | ORAL | Status: AC
  Filled 2017-03-01: qty 2

## 2017-03-01 MED ORDER — INSULIN ASPART 100 UNIT/ML ~~LOC~~ SOLN
3.0000 [IU] | Freq: Three times a day (TID) | SUBCUTANEOUS | Status: DC
  Administered 2017-03-01 – 2017-03-06 (×16): 3 [IU] via SUBCUTANEOUS

## 2017-03-01 MED ORDER — INSULIN GLARGINE 100 UNIT/ML ~~LOC~~ SOLN
10.0000 [IU] | Freq: Every day | SUBCUTANEOUS | Status: DC
  Administered 2017-03-01 – 2017-03-05 (×5): 10 [IU] via SUBCUTANEOUS
  Filled 2017-03-01 (×6): qty 0.1

## 2017-03-01 MED ORDER — DOCUSATE SODIUM 100 MG PO CAPS: 100.0000 mg | ORAL_CAPSULE | Freq: Two times a day (BID) | ORAL | Status: DC

## 2017-03-01 MED ORDER — HYDROMORPHONE HCL 1 MG/ML IJ SOLN
1.0000 mg | INTRAMUSCULAR | Status: DC | PRN
  Administered 2017-03-01 – 2017-03-05 (×9): 1 mg via INTRAVENOUS
  Filled 2017-03-01 (×10): qty 1

## 2017-03-01 MED ORDER — FENTANYL CITRATE (PF) 100 MCG/2ML IJ SOLN
25.0000 ug | Freq: Once | INTRAMUSCULAR | Status: AC
  Administered 2017-03-01: 25 ug via INTRAVENOUS

## 2017-03-01 MED ORDER — HEPARIN 1000 UNIT/ML FOR PERITONEAL DIALYSIS
2500.0000 [IU] | INTRAMUSCULAR | Status: DC | PRN
  Filled 2017-03-01: qty 2.5

## 2017-03-01 MED ORDER — MIDAZOLAM HCL 2 MG/2ML IJ SOLN
INTRAMUSCULAR | Status: AC
  Filled 2017-03-01: qty 2

## 2017-03-01 MED ORDER — CARVEDILOL 6.25 MG PO TABS
6.2500 mg | ORAL_TABLET | Freq: Every day | ORAL | Status: DC
  Administered 2017-03-01 – 2017-03-05 (×4): 6.25 mg via ORAL
  Filled 2017-03-01 (×4): qty 1

## 2017-03-01 MED ORDER — ACETAMINOPHEN 325 MG PO TABS
650.0000 mg | ORAL_TABLET | ORAL | Status: DC | PRN
  Administered 2017-03-04: 650 mg via ORAL
  Filled 2017-03-01: qty 2

## 2017-03-01 MED ORDER — NITROGLYCERIN 0.4 MG SL SUBL: 0.4000 mg | SUBLINGUAL_TABLET | SUBLINGUAL | Status: DC | PRN

## 2017-03-01 MED ORDER — PROPOFOL 10 MG/ML IV BOLUS
INTRAVENOUS | Status: AC
  Filled 2017-03-01: qty 20

## 2017-03-01 MED ORDER — PHENYLEPHRINE 40 MCG/ML (10ML) SYRINGE FOR IV PUSH (FOR BLOOD PRESSURE SUPPORT)
PREFILLED_SYRINGE | INTRAVENOUS | Status: AC
  Filled 2017-03-01: qty 20

## 2017-03-01 MED ORDER — METOCLOPRAMIDE HCL 5 MG/ML IJ SOLN: 5.0000 mg | Freq: Three times a day (TID) | INTRAMUSCULAR | Status: DC | PRN

## 2017-03-01 MED ORDER — FENTANYL CITRATE (PF) 100 MCG/2ML IJ SOLN
25.0000 ug | INTRAMUSCULAR | Status: DC | PRN
  Administered 2017-03-01: 50 ug via INTRAVENOUS

## 2017-03-01 MED ORDER — DELFLEX-LC/1.5% DEXTROSE 344 MOSM/L IP SOLN
INTRAPERITONEAL | Status: DC
  Administered 2017-03-01: 19:00:00 via INTRAPERITONEAL
  Administered 2017-03-02: 5000 mL via INTRAPERITONEAL

## 2017-03-01 MED ORDER — 0.9 % SODIUM CHLORIDE (POUR BTL) OPTIME
TOPICAL | Status: DC | PRN
  Administered 2017-03-01: 1000 mL

## 2017-03-01 MED ORDER — CHLORHEXIDINE GLUCONATE 4 % EX LIQD: 60.0000 mL | Freq: Once | CUTANEOUS | Status: DC

## 2017-03-01 MED ORDER — POLYETHYLENE GLYCOL 3350 17 G PO PACK
17.0000 g | PACK | Freq: Every day | ORAL | Status: DC | PRN
  Administered 2017-03-04 – 2017-03-06 (×2): 17 g via ORAL
  Filled 2017-03-01 (×2): qty 1

## 2017-03-01 MED ORDER — CLOPIDOGREL BISULFATE 75 MG PO TABS
75.0000 mg | ORAL_TABLET | Freq: Every day | ORAL | Status: DC
  Administered 2017-03-02 – 2017-03-06 (×5): 75 mg via ORAL
  Filled 2017-03-01 (×5): qty 1

## 2017-03-01 MED ORDER — PHENYLEPHRINE 40 MCG/ML (10ML) SYRINGE FOR IV PUSH (FOR BLOOD PRESSURE SUPPORT)
PREFILLED_SYRINGE | INTRAVENOUS | Status: AC
  Filled 2017-03-01: qty 10

## 2017-03-01 MED ORDER — MAGNESIUM CITRATE PO SOLN: 1.0000 | Freq: Once | ORAL | Status: DC | PRN

## 2017-03-01 MED ORDER — METOCLOPRAMIDE HCL 5 MG PO TABS: 5.0000 mg | ORAL_TABLET | Freq: Three times a day (TID) | ORAL | Status: DC | PRN

## 2017-03-01 MED ORDER — METHOCARBAMOL 500 MG PO TABS
500.0000 mg | ORAL_TABLET | Freq: Four times a day (QID) | ORAL | Status: DC | PRN
  Filled 2017-03-01: qty 1

## 2017-03-01 MED ORDER — BISACODYL 10 MG RE SUPP: 10.0000 mg | Freq: Every day | RECTAL | Status: DC | PRN

## 2017-03-01 MED ORDER — ACETAMINOPHEN 650 MG RE SUPP: 650.0000 mg | RECTAL | Status: DC | PRN

## 2017-03-01 MED ORDER — FENTANYL CITRATE (PF) 100 MCG/2ML IJ SOLN
INTRAMUSCULAR | Status: AC
  Filled 2017-03-01: qty 2

## 2017-03-01 MED ORDER — DEXAMETHASONE SODIUM PHOSPHATE 10 MG/ML IJ SOLN
INTRAMUSCULAR | Status: DC | PRN
  Administered 2017-03-01: 5 mg via INTRAVENOUS

## 2017-03-01 MED ORDER — PROPOFOL 10 MG/ML IV BOLUS
INTRAVENOUS | Status: DC | PRN
  Administered 2017-03-01: 120 mg via INTRAVENOUS

## 2017-03-01 MED ORDER — SODIUM CHLORIDE 0.9 % IV SOLN
INTRAVENOUS | Status: DC
  Administered 2017-03-01: 16:00:00 via INTRAVENOUS

## 2017-03-01 MED ORDER — OXYCODONE HCL 5 MG PO TABS
2.5000 mg | ORAL_TABLET | ORAL | Status: AC
  Administered 2017-03-01: 5 mg via ORAL
  Filled 2017-03-01 (×2): qty 1

## 2017-03-01 MED ORDER — ONDANSETRON HCL 4 MG PO TABS: 4.0000 mg | ORAL_TABLET | Freq: Four times a day (QID) | ORAL | Status: DC | PRN

## 2017-03-01 MED ORDER — CEFAZOLIN SODIUM-DEXTROSE 2-4 GM/100ML-% IV SOLN
2.0000 g | INTRAVENOUS | Status: AC
  Administered 2017-03-01: 2 g via INTRAVENOUS
  Filled 2017-03-01: qty 100

## 2017-03-01 SURGICAL SUPPLY — 39 items
BLADE SAW RECIP 87.9 MT (BLADE) ×4 IMPLANT
BLADE SURG 21 STRL SS (BLADE) ×4 IMPLANT
BNDG COHESIVE 6X5 TAN STRL LF (GAUZE/BANDAGES/DRESSINGS) ×8 IMPLANT
BNDG GAUZE ELAST 4 BULKY (GAUZE/BANDAGES/DRESSINGS) ×8 IMPLANT
CANISTER PREVENA 45 (CANNISTER) ×8 IMPLANT
COVER SURGICAL LIGHT HANDLE (MISCELLANEOUS) ×4 IMPLANT
CUFF TOURNIQUET SINGLE 34IN LL (TOURNIQUET CUFF) IMPLANT
CUFF TOURNIQUET SINGLE 44IN (TOURNIQUET CUFF) IMPLANT
DRAPE EXTREMITY T 121X128X90 (DRAPE) ×4 IMPLANT
DRAPE HALF SHEET 40X57 (DRAPES) ×4 IMPLANT
DRAPE INCISE IOBAN 66X45 STRL (DRAPES) ×4 IMPLANT
DRAPE U-SHAPE 47X51 STRL (DRAPES) ×8 IMPLANT
DRESSING PREVENA PLUS CUSTOM (GAUZE/BANDAGES/DRESSINGS) ×2 IMPLANT
DRSG PREVENA PLUS CUSTOM (GAUZE/BANDAGES/DRESSINGS) ×4
DRSG VAC ATS MED SENSATRAC (GAUZE/BANDAGES/DRESSINGS) ×4 IMPLANT
DURAPREP 26ML APPLICATOR (WOUND CARE) ×4 IMPLANT
ELECT REM PT RETURN 9FT ADLT (ELECTROSURGICAL) ×4
ELECTRODE REM PT RTRN 9FT ADLT (ELECTROSURGICAL) ×2 IMPLANT
GLOVE BIOGEL PI IND STRL 9 (GLOVE) ×2 IMPLANT
GLOVE BIOGEL PI INDICATOR 9 (GLOVE) ×2
GLOVE SURG ORTHO 9.0 STRL STRW (GLOVE) ×4 IMPLANT
GOWN STRL REUS W/ TWL XL LVL3 (GOWN DISPOSABLE) ×4 IMPLANT
GOWN STRL REUS W/TWL XL LVL3 (GOWN DISPOSABLE) ×4
KIT BASIN OR (CUSTOM PROCEDURE TRAY) ×4 IMPLANT
KIT ROOM TURNOVER OR (KITS) ×4 IMPLANT
MANIFOLD NEPTUNE II (INSTRUMENTS) ×4 IMPLANT
NS IRRIG 1000ML POUR BTL (IV SOLUTION) ×4 IMPLANT
PACK GENERAL/GYN (CUSTOM PROCEDURE TRAY) ×4 IMPLANT
PACK ORTHO EXTREMITY (CUSTOM PROCEDURE TRAY) ×4 IMPLANT
PAD ARMBOARD 7.5X6 YLW CONV (MISCELLANEOUS) ×4 IMPLANT
PAD NEG PRESSURE SENSATRAC (MISCELLANEOUS) IMPLANT
SPONGE LAP 18X18 X RAY DECT (DISPOSABLE) IMPLANT
STAPLER VISISTAT 35W (STAPLE) IMPLANT
STOCKINETTE IMPERVIOUS LG (DRAPES) ×4 IMPLANT
SUT ETHILON 2 0 PSLX (SUTURE) ×8 IMPLANT
SUT SILK 2 0 (SUTURE) ×2
SUT SILK 2-0 18XBRD TIE 12 (SUTURE) ×2 IMPLANT
SUT VIC AB 1 CTX 27 (SUTURE) IMPLANT
TOWEL OR 17X26 10 PK STRL BLUE (TOWEL DISPOSABLE) ×4 IMPLANT

## 2017-03-01 NOTE — Op Note (Signed)
   Date of Surgery: 03/01/2017  INDICATIONS: Mr. [name] is a 76 y.o.-year-old male who has diabetic insensate neuropathy peripheral vascular disease end-stage renal disease who is status post a left transtibial amputation patient presents at this time with gangrenous changes to the right foot as well as dehiscence of the transtibial irritation on the left.  Patient wished to proceed with revision of the left transtibial amputation and proceed with a right transtibial amputation.[name].  PREOPERATIVE DIAGNOSIS: Dehiscence of the left transtibial amputation with gangrene of the right foot  POSTOPERATIVE DIAGNOSIS: Same.  PROCEDURE: Transtibial amputation right lower extremity, primary, revision left transtibial amputation.  Application of Praveena wound vacs x2. Application of Prevena wound VAC  SURGEON: Lajoyce Cornersuda, M.D.  ANESTHESIA:  general  IV FLUIDS AND URINE: See anesthesia.  ESTIMATED BLOOD LOSS: min mL.  COMPLICATIONS: None.  DESCRIPTION OF PROCEDURE: The patient was brought to the operating room and underwent a general anesthetic. After adequate levels of anesthesia were obtained patient's lower extremities were prepped using DuraPrep draped into a sterile field. A timeout was called. The right foot was draped out of the sterile field with impervious stockinette. A transverse incision was made 11 cm distal to the tibial tubercle. This curved proximally and a large posterior flap was created. The tibia was transected 1 cm proximal to the skin incision. The fibula was transected just proximal to the tibial incision. The tibia was beveled anteriorly. A large posterior flap was created. The sciatic nerve was pulled cut and allowed to retract. The vascular bundles were suture ligated with 2-0 silk. The deep and superficial fascial layers were closed using #1 Vicryl. The skin was closed using staples and 2-0 nylon. The wound was covered with a Prevena wound VAC. There was a good suction fit.  A  fishmouth was made just proximal to the necrotic tissue of the left transtibial amputation.  The distal 2 cm of the tibia and fibula were resected.  Patient had debridement of all necrotic muscle skin and soft tissue.  The incision was closed using 2-0 nylon.  A Praveena wound VAC was applied this had a good suction fit patient was extubated and taken to the PACU in stable condition.  Patient was extubated taken to the PACU in stable condition.  Aldean BakerMarcus Maeva Dant, MD [facility] 1:18 PM

## 2017-03-01 NOTE — Transfer of Care (Signed)
Immediate Anesthesia Transfer of Care Note  Patient: Joshua [name]  Procedure(s) Performed: REVISION LEFT BELOW KNEE AMPUTATION (Left ) RIGHT BELOW KNEE AMPUTATION (Right )  Patient Location: PACU  Anesthesia Type:General  Level of Consciousness: drowsy and patient cooperative  Airway & Oxygen Therapy: Patient Spontanous Breathing and Patient connected to face mask oxygen  Post-op Assessment: Report given to RN and Post -op Vital signs reviewed and stable  Post vital signs: Reviewed and stable  Last Vitals:  Vitals:   03/01/17 0951 03/01/17 1320  BP: (!) 127/49 126/70  Pulse: 93 98  Resp: 18 (!) 28  Temp: 36.4 C (!) 36.3 C  SpO2: 99% 100%    Last Pain:  Vitals:   03/01/17 1144  TempSrc:   PainSc: 9       Patients Stated Pain Goal: 3 (03/01/17 1144)  Complications: No apparent anesthesia complications

## 2017-03-01 NOTE — Consult Note (Addendum)
Reason for Consult: To manage dialysis and dialysis related needs Referring Physician: [name] is an 76 y.o. male with DM, HTN, COPD, CAD, CHF and ESRD on PD [place] in [place] known to [place] from a previous prolonged admission really spanning Sept and October of this year.  He had PAD and eventually required a left BKA - he was discharged from rehab on 10/30.  He presents back to undergo a right BKA and revision of the left. We are asked to provide his PD.  No issues with PD according to wife, pt resting after surgery     Dialysis Orders:  Center[facility]. 6 exchanges/day one mid-day exchange (occurs around 9am).  2.5 liters of 2.5% per [facility] 2 liters of 2.5% for last fill and mid day drain. Pt says EDW now is around 148 lbs or 67kg. [name]      Past Medical History:  Diagnosis Date  . AICD (automatic cardioverter/defibrillator) present    [employer] [phone] lead [id number]  . CHF (congestive heart failure) ([place])   . Complication of anesthesia   . COPD (chronic obstructive pulmonary disease) ([place])   . Coronary artery disease    CABG 2011  . Diabetes mellitus without complication ([place])   . ESRD on peritoneal dialysis [facility])    Started PD Dec 2016 in [employer]. Nephrology is in [place].   . Gangrene ([place])    left knee  . GERD (gastroesophageal reflux disease)   . History of peritonitis    PD cath related peritonitis in April 2017  . Hypertension   . Peripheral vascular disease ([place])   . PONV (postoperative nausea and vomiting)   . Sjogren's syndrome ([place])    Per pt's wife, was diagnosed in 2016 during w/u for cause of renal failure prior to starting dialysis.  He had a rash on his chest apparently prompting this w/u.  Never had renal bx.  He was referred to a rheum MD in [place] and per the wife he was treated with MTX and other medications but had side effects to "all of it" and isn't taking anything for this as of Jun  2017.   [name] Sleep apnea    wears Bipap  . Stroke ([place])   . Wound dehiscence    Right foot    Past Surgical History:  Procedure Laterality Date  . ABDOMINAL AORTOGRAM W/LOWER EXTREMITY N/A 11/29/2016   Procedure: ABDOMINAL AORTOGRAM W/LOWER EXTREMITY;  Surgeon: Serafina Mitchell, MD;  Location: [place] CV LAB;  Service: Cardiovascular;  Laterality: N/A;  . AMPUTATION Left 11/29/2016   Procedure: LEFT THIRD TOE AMPUTATION;  Surgeon: Serafina Mitchell, MD;  Location: [facility] OR;  Service: Vascular;  Laterality: Left;  . AMPUTATION Left 12/23/2016   Procedure: Left Transmetatarsal Amputation;  Surgeon: Newt Minion, MD;  Location: [place];  Service: Orthopedics;  Laterality: Left;  . AMPUTATION Left 01/11/2017   Procedure: LEFT BELOW KNEE AMPUTATION;  Surgeon: Newt Minion, MD;  Location: [place];  Service: Orthopedics;  Laterality: Left;  . AMPUTATION Right 01/20/2017   Procedure: AMPUTATION RIGHT FIFTH TOE;  Surgeon: Serafina Mitchell, MD;  Location: [facility] OR;  Service: Vascular;  Laterality: Right;  . ANGIOPLASTY  12/15/2016   Procedure: Fredderick [place]ix;  Surgeon: Waynetta Sandy, MD;  Location: [place];  Service: Vascular;;  . cardiac catherization with stent placement  2017  . COLONOSCOPY W/ BIOPSIES AND POLYPECTOMY    . CORONARY ARTERY  BYPASS GRAFT  2011  . FALSE ANEURYSM REPAIR Right 12/15/2016   Procedure: REPAIR FALSE ANEURYSM;  Surgeon: Waynetta Sandy, MD;  Location: [place];  Service: Vascular;  Laterality: Right;  . IRRIGATION AND DEBRIDEMENT FOOT Left 12/15/2016   Procedure: IRRIGATION AND DEBRIDEMENT FOOT;  Surgeon: Waynetta Sandy, MD;  Location: [place];  Service: Vascular;  Laterality: Left;  . LEFT HEART CATH AND CORS/GRAFTS ANGIOGRAPHY N/A 01/09/2017   Procedure: LEFT HEART CATH AND CORS/GRAFTS ANGIOGRAPHY;  Surgeon: Lorretta Harp, MD;  Location: [place] CV LAB;  Service: Cardiovascular;  Laterality: N/A;  . LOWER EXTREMITY ANGIOGRAM Left 12/15/2016    Procedure: LOWER EXTREMITY ANGIOGRAM; PEDAL ACCESS;  Surgeon: Waynetta Sandy, MD;  Location: [place];  Service: Vascular;  Laterality: Left;  . PERIPHERAL VASCULAR BALLOON ANGIOPLASTY  11/29/2016   Procedure: PERIPHERAL VASCULAR BALLOON ANGIOPLASTY;  Surgeon: Serafina Mitchell, MD;  Location: [employer] CV LAB;  Service: Cardiovascular;;  LT Peroneal AT attempted unsuccessful    Family History  Problem Relation Age of Onset  . Heart disease Father   . Diabetes Mother     Social History:  reports that he has quit smoking. His smoking use included pipe. he has never used smokeless tobacco. He reports that he does not drink alcohol or use drugs.  Allergies:  Allergies  Allergen Reactions  . Gabapentin (Once-Daily) Hypertension  . Lisinopril Hives  . Methotrexate Derivatives Other (See Comments)    blisters  . Sulfa Antibiotics Hives  . Tape Hives  . Vancomycin Other (See Comments)    Blisters   . Zanaflex [Tizanidine Hcl]     Hypotensive stroke    Medications: I have reviewed the patient's current medications.   Results for orders placed or performed during the hospital encounter of 03/01/17 (from the past 48 hour(s))  Glucose, capillary     Status: Abnormal   Collection Time: 03/01/17  9:55 AM  Result Value Ref Range   Glucose-Capillary 185 (H) 65 - 99 mg/dL   Comment 1 Notify RN    Comment 2 Document in Chart   APTT     Status: None   Collection Time: 03/01/17 10:06 AM  Result Value Ref Range   aPTT 36 24 - 36 seconds  CBC     Status: Abnormal   Collection Time: 03/01/17 10:06 AM  Result Value Ref Range   WBC 10.8 (H) 4.0 - 10.5 K/uL   RBC 3.68 (L) 4.22 - 5.81 MIL/uL   Hemoglobin 11.1 (L) 13.0 - 17.0 g/dL   HCT 34.4 (L) 39.0 - 52.0 %   MCV 93.5 78.0 - 100.0 fL   MCH 30.2 26.0 - 34.0 pg   MCHC 32.3 30.0 - 36.0 g/dL   RDW 15.0 11.5 - 15.5 %   Platelets 367 150 - 400 K/uL  Comprehensive metabolic panel     Status: Abnormal   Collection Time: 03/01/17 10:06  AM  Result Value Ref Range   Sodium 135 135 - 145 mmol/L   Potassium 3.7 3.5 - 5.1 mmol/L    Comment: HEMOLYSIS AT THIS LEVEL MAY AFFECT RESULT   Chloride 99 (L) 101 - 111 mmol/L   CO2 25 22 - 32 mmol/L   Glucose, Bld 196 (H) 65 - 99 mg/dL   BUN 28 (H) 6 - 20 mg/dL   Creatinine, Ser 4.88 (H) 0.61 - 1.24 mg/dL   Calcium 9.2 8.9 - 10.3 mg/dL   Total Protein 5.7 (L) 6.5 - 8.1 g/dL   Albumin  1.8 (L) 3.5 - 5.0 g/dL   AST 38 15 - 41 U/L   ALT 18 17 - 63 U/L   Alkaline Phosphatase 86 38 - 126 U/L   Total Bilirubin 0.9 0.3 - 1.2 mg/dL   GFR calc non Af Amer 11 (L) >60 mL/min   GFR calc Af Amer 12 (L) >60 mL/min    Comment: (NOTE) The eGFR has been calculated using the CKD EPI equation. This calculation has not been validated in all clinical situations. eGFR's persistently <60 mL/min signify possible Chronic Kidney Disease.    Anion gap 11 5 - 15  Protime-INR     Status: None   Collection Time: 03/01/17 10:06 AM  Result Value Ref Range   Prothrombin Time 13.8 11.4 - 15.2 seconds   INR 1.07   Glucose, capillary     Status: None   Collection Time: 03/01/17  1:25 PM  Result Value Ref Range   Glucose-Capillary 98 65 - 99 mg/dL    No results found.  ROS: unable to determine as pt is somnolent after surgery - family at bedside pleased that he is comfortable  Blood pressure (!) 144/67, pulse 90, temperature (!) 97.2 F (36.2 C), resp. rate (!) 22, height 5' 9" (1.753 m), weight 65.8 kg (145 lb), SpO2 98 %. General appearance: fatigued and no distress Eyes: conjunctivae/corneas clear. PERRL, EOM's intact. Fundi benign. Resp: clear to auscultation bilaterally Cardio: regular rate and rhythm, S1, S2 normal, no murmur, click, rub or gallop GI: soft, non-tender; bowel sounds normal; no masses,  no organomegaly and PD cath in left lower quad- very clean- no erythema Extremities: s/p OR- now with bilat amputations   Assessment/Plan: 76 year old WM with multiple medical issues including ESRD  on PD 1 PAD- s/p revision of left  BKA and new right BKA 2 ESRD: normally on PD- followed in [place]- will follow his home script except use 1.5 % fluid and no last bag fill  3 Hypertension: controlled now- only on low dose coreg 4. Anemia of ESRD:  hgb over 11- no ESA for now 5. 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 Malnutrition- albumin in the 1's bring to mind the question of whether PD is still appropriate in this case  7. Family concerned that urine is thick and malodorous.  Will check U/A with culture    GOLDSBOROUGH,KELLIE A 03/01/2017, 3:21 PM

## 2017-03-01 NOTE — Progress Notes (Signed)
[facility]. Therapist, [employer] rep Joey called. He advised this Clinical research associatewriter to place the emergency orders on the chart, and if the surgeon needs his assistance, they can call [phone].

## 2017-03-01 NOTE — Anesthesia Postprocedure Evaluation (Signed)
Anesthesia Post Note  Patient: [name] KitchenJames D Artz  Procedure(s) Performed: REVISION LEFT BELOW KNEE AMPUTATION (Left ) RIGHT BELOW KNEE AMPUTATION (Right )     Patient location during evaluation: PACU Anesthesia Type: General Level of consciousness: awake and alert Pain management: pain level controlled Vital Signs Assessment: post-procedure vital signs reviewed and stable Respiratory status: spontaneous breathing, nonlabored ventilation, respiratory function stable and patient connected to nasal cannula oxygen Cardiovascular status: blood pressure returned to baseline and stable Postop Assessment: no apparent nausea or vomiting Anesthetic complications: no    Last Vitals:  Vitals:   03/01/17 1419 03/01/17 1439  BP: 130/66 (!) 144/67  Pulse: 88 90  Resp: 16 (!) 22  Temp:  (!) 36.2 C  SpO2: 100% 98%    Last Pain:  Vitals:   03/01/17 1439  TempSrc:   PainSc: Asleep                 Shiquan Mathieu EDWARD

## 2017-03-01 NOTE — Anesthesia Procedure Notes (Signed)
Procedure Name: LMA Insertion Date/Time: 03/01/2017 12:13 PM Performed by: Army FossaPulliam, Randal Yepiz Dane, CRNA Pre-anesthesia Checklist: Patient identified, Emergency Drugs available, Suction available and Patient being monitored Patient Re-evaluated:Patient Re-evaluated prior to induction Oxygen Delivery Method: Circle System Utilized Preoxygenation: Pre-oxygenation with 100% oxygen Induction Type: IV induction Ventilation: Mask ventilation without difficulty LMA: LMA inserted LMA Size: 4.0 Number of attempts: 1 Airway Equipment and Method: Bite block Placement Confirmation: positive ETCO2 Tube secured with: Tape Dental Injury: Teeth and Oropharynx as per pre-operative assessment

## 2017-03-01 NOTE — H&P (Signed)
Joshua [name] is an 76 y.o. male.   Chief Complaint: Abscess ulceration right foot with ulceration and dehiscence left transtibial amputation HPI: Patient presents for 2 separate issues #1 status post transtibial amputation of the left #2 status post fifth toe amputation of the right.  Patient and family states that he has progressive ischemic gangrenous changes to the surgical wound area in the right foot he currently has a urinary tract infection being treated with oral antibiotics and has progressive dry gangrenous changes of the medial aspect of the left transtibial amputation.    Past Medical History:  Diagnosis Date  . AICD (automatic cardioverter/defibrillator) present    [employer] [id number] lead [id number]  . CHF (congestive heart failure) (HCC)   . Complication of anesthesia   . COPD (chronic obstructive pulmonary disease) (HCC)   . Coronary artery disease    CABG 2011  . Diabetes mellitus without complication (HCC)   . ESRD on peritoneal dialysis [facility](HCC)    [place]phrology [place] in [place].   . Gangrene (HCC)    left knee  . GERD (gastroesophageal reflux disease)   . History of peritonitis    PD cath related peritonitis in April 2017  . Hypertension   . Peripheral vascular disease (HCC)   . PONV (postoperative nausea and vomiting)   . Sjogren's syndrome (HCC)    Per pt's wife, was diagnosed in 2016 during w/u for cause of renal failure prior to starting dialysis.  He had a rash on his chest apparently prompting this w/u.  Never had renal bx.  He was referred to a rheum MD in [place] and per the wife he was treated with MTX and other medications but had side effects to "all of it" and isn't taking anything for this as of Jun 2017.   [name]. Sleep apnea    wears Bipap  . Stroke (HCC)   . Wound dehiscence    Right foot    Past Surgical History:  Procedure Laterality Date  . ABDOMINAL AORTOGRAM W/LOWER EXTREMITY N/A 11/29/2016   Procedure: ABDOMINAL AORTOGRAM W/LOWER EXTREMITY;  Surgeon: Nada LibmanBrabham, Vance W, MD;  [facility];  Service: Cardiovascular;  Laterality: N/A;  . AMPUTATION Left 11/29/2016   Procedure: LEFT THIRD TOE AMPUTATION;  Surgeon: Nada LibmanBrabham, Vance W, MD;  [facility];  Service: Vascular;  Laterality: Left;  . AMPUTATION Left 12/23/2016   Procedure: Left Transmetatarsal Amputation;  Surgeon: [name], Marcus V, MD;  [facility];  Service: Orthopedics;  Laterality: Left;  . AMPUTATION Left 01/11/2017   Procedure: LEFT BELOW KNEE AMPUTATION;  Surgeon: [name], Marcus V, MD;  [facility];  Service: Orthopedics;  Laterality: Left;  . AMPUTATION Right 01/20/2017   Procedure: AMPUTATION RIGHT FIFTH TOE;  Surgeon: Nada LibmanBrabham, Vance W, MD;  [facility];  Service: Vascular;  Laterality: Right;  . ANGIOPLASTY  12/15/2016   Procedure: Peyton BottomsBALLOON ANGIOPLASTY;  Surgeon: Maeola Harmanain, Brandon Christopher, MD;  [facility];  Service: Vascular;;  . cardiac catherization with stent placement  2017  . COLONOSCOPY W/ BIOPSIES AND POLYPECTOMY    . CORONARY ARTERY BYPASS GRAFT  2011  . FALSE ANEURYSM REPAIR Right 12/15/2016   Procedure: REPAIR FALSE ANEURYSM;  Surgeon: Maeola Harmanain, Brandon Christopher, MD;  [facility];  Service: Vascular;  Laterality: Right;  . IRRIGATION AND DEBRIDEMENT FOOT Left 12/15/2016   Procedure: IRRIGATION AND DEBRIDEMENT FOOT;  Surgeon: Maeola Harmanain, Brandon Christopher, MD;  [facility];  Service:  Vascular;  Laterality: Left;  . LEFT HEART CATH AND CORS/GRAFTS ANGIOGRAPHY N/A 01/09/2017   Procedure: LEFT HEART CATH AND CORS/GRAFTS ANGIOGRAPHY;  Surgeon: Runell GessBerry, Jonathan J, MD;  [facility];  Service: Cardiovascular;  Laterality: N/A;  . LOWER EXTREMITY ANGIOGRAM Left 12/15/2016   Procedure: LOWER EXTREMITY ANGIOGRAM; PEDAL ACCESS;  Surgeon: Maeola Harmanain, Brandon Christopher, MD;  [facility];  Service: Vascular;  Laterality: Left;  . PERIPHERAL VASCULAR BALLOON ANGIOPLASTY  11/29/2016    Procedure: PERIPHERAL VASCULAR BALLOON ANGIOPLASTY;  Surgeon: Nada LibmanBrabham, Vance W, MD;  [facility];  Service: Cardiovascular;;  LT Peroneal AT attempted unsuccessful    Family History  Problem Relation Age of Onset  . Heart disease Father   . Diabetes Mother    Social History:  reports that he has quit smoking. His smoking use included pipe. he has never used smokeless tobacco. He reports that he does not drink alcohol or use drugs.  Allergies:  Allergies  Allergen Reactions  . Gabapentin (Once-Daily) Hypertension  . Lisinopril Hives  . Methotrexate Derivatives Other (See Comments)    blisters  . Sulfa Antibiotics Hives  . Tape Hives  . Vancomycin Other (See Comments)    Blisters   . Zanaflex [Tizanidine Hcl]     Hypotensive stroke    No medications prior to admission.    No results found for this or any previous visit (from the past 48 hour(s)). No results found.  Review of Systems  All other systems reviewed and are negative.   There were no vitals taken for this visit. Physical Exam   Patient is alert, oriented, no adenopathy, well-dressed, normal affect, normal respiratory effort. Examination patient is in a wheelchair.  He has progressive dehiscence of the wound of the left transtibial amputation medially there is dry ischemic changes the wound is 6 cm in diameter and 2 cm deep.  There is no ascending cellulitis no odor no drainage.  Patient's right foot fifth toe amputation is reviewed patient does have progressive ischemic gangrenous changes to the surgical incision.  There is no ascending cellulitis.   Assessment/Plan Assessment & Plan: Visit Diagnoses:  1. History of left below knee amputation (HCC)   2. Gangrene of right foot (HCC)     Plan: Discussed with the patient that we will need to proceed with revision surgery for the left transtibial amputation.  Patient does have a follow-up appointment on Monday with the vascular vein surgeons.   Patient family states that they feel that there are not any other revascularization options to the right lower extremity but they do have a follow-up appointment both with vascular and with follow-up for his urinary tract infection.  They state they would like to proceed with surgical intervention once he is cleared from a vascular and urinary tract infection perspective.  Would plan for revision of a transtibial amputation of the left with a right transtibial amputation.  Patient is on peritoneal dialysis and will need to continue this during the hospital and will need discharge to skilled nursing.      Nadara MustardMarcus V Duda, MD 03/01/2017, 6:46 AM

## 2017-03-01 NOTE — Progress Notes (Signed)
Dr.Yates called re: wife stated patient had 2 episodes, lasting less than a minute, where his whole body shook and arms raised. Patient was able to speak to her, no loss of consciousness. Rapid Response called, awaiting their assessment. CBG was 158.

## 2017-03-01 NOTE — Progress Notes (Signed)
PD tx initiated via tenckhoff w/o problem, VSS, report given to Cecil CobbsMolly Weismiller, RN

## 2017-03-01 NOTE — Progress Notes (Signed)
[facility] machine alarming. Large piece of fibrin seen. Machine read patient line blocked. [facility] she stated she had just put a patient on and it would be 4-5 hours before someone could come. Madison Hickmanory was asked to call/notify  Her Department Director.

## 2017-03-01 NOTE — Anesthesia Preprocedure Evaluation (Addendum)
Anesthesia Evaluation  Patient identified by MRN, date of birth, ID band Patient awake    Reviewed: Allergy & Precautions, NPO status , Patient's Chart, lab work & pertinent test results, reviewed documented beta blocker date and time   History of Anesthesia Complications Negative for: history of anesthetic complications  Airway Mallampati: IV  TM Distance: >3 FB Neck ROM: Full    Dental  (+) Chipped, Dental Advisory Given   Pulmonary sleep apnea (BiPAP) and Continuous Positive Airway Pressure Ventilation , COPD, former smoker,    breath sounds clear to auscultation       Cardiovascular hypertension, Pt. on medications and Pt. on home beta blockers + CAD, + CABG, + Peripheral Vascular Disease and +CHF  + Cardiac Defibrillator  Rhythm:Regular Rate:Normal  EKG 12/2016 - 1st deg AVB with PVCs, septal Q waves  10/14/16 [facility]): MODERATE LV DYSFUNCTION ( 35% EF) WITH MILD LVH NORMAL RIGHT VENTRICULAR SYSTOLIC FUNCTION VALVULAR REGURGITATION: TRIVIAL AR, TRIVIAL MR, TRIVIAL PR, TRIVIAL TR NO VALVULAR STENOSIS  AICD - [employer] SQ EMBLEM S-ICD 45CM [phone]- [id number] [employer] EMBLEM S-ICD MRI PULSE GENERATOR - [id number]   Neuro/Psych CVA, No Residual Symptoms    GI/Hepatic negative GI ROS, Neg liver ROS,   Endo/Other  diabetes, Insulin DependentSjogren's, obesity  Renal/GU Dialysis and ESRFRenal disease (peritoneal)     Musculoskeletal Sjogren's   Abdominal (+) + obese,   Peds  Hematology  (+) Blood dyscrasia (Hb 10.5), ,   Anesthesia Other Findings   Reproductive/Obstetrics                            Anesthesia Physical  Anesthesia Plan  ASA: III  Anesthesia Plan: General   Post-op Pain Management:    Induction: Intravenous  PONV Risk Score and Plan: 2 and Ondansetron, Treatment may vary due to age or medical condition, Propofol infusion and Dexamethasone  Airway Management  Planned: LMA  Additional Equipment: None  Intra-op Plan:   Post-operative Plan:   Informed Consent: I have reviewed the patients History and Physical, chart, labs and discussed the procedure including the risks, benefits and alternatives for the proposed anesthesia with the patient or authorized representative who has indicated his/her understanding and acceptance.   Dental advisory given  Plan Discussed with: CRNA and Surgeon  Anesthesia Plan Comments: ( )        Anesthesia Quick Evaluation

## 2017-03-01 NOTE — Significant Event (Addendum)
Rapid Response Event Note  Overview: Called by bedside RN d/t patient's wife stating patient had 2 episodes of "shaking." Time Called: 2215 Arrival Time: 2226 Event Type: Neurologic  Initial Focused Assessment: Pt very lethargic, awakens to repeated stimuli, will follow simple commands and answer some questions, then falls back asleep.  VS taken after event and PTA RRT stable: HR-64, BP-93/68, 95% on RA, RR-20. CBG-158.  Pt skin cool and clammy.  Pupils 3 and brisk.  Pt with equal strength in upper extremities.  B BKA. Pt given 1mg  dilaudid @2130  for pain level "10/10"  Interventions: Dr. Ophelia CharterYates notified of event and orders to monitor patient and notify if event happens again.  Plan of Care (if not transferred): Continue to monitor pt.  Recheck BP in 7330min-1hr.  Event Summary: Name of Physician Notified: Dr. Ophelia CharterYates at (PTA RRT)    at    Event ended: 2245       Terrilyn SaverHopper, Cataleah Stites Anderson

## 2017-03-02 ENCOUNTER — Encounter (HOSPITAL_COMMUNITY): Payer: Self-pay | Admitting: Orthopedic Surgery

## 2017-03-02 ENCOUNTER — Other Ambulatory Visit: Payer: Self-pay

## 2017-03-02 LAB — URINALYSIS, ROUTINE W REFLEX MICROSCOPIC
BACTERIA UA: NONE SEEN
Bilirubin Urine: NEGATIVE
Ketones, ur: NEGATIVE mg/dL
Nitrite: NEGATIVE
PROTEIN: 100 mg/dL — AB
Specific Gravity, Urine: 1.016 (ref 1.005–1.030)
pH: 5 (ref 5.0–8.0)

## 2017-03-02 LAB — GLUCOSE, CAPILLARY
GLUCOSE-CAPILLARY: 260 mg/dL — AB (ref 65–99)
GLUCOSE-CAPILLARY: 170 mg/dL — AB (ref 65–99)
GLUCOSE-CAPILLARY: 237 mg/dL — AB (ref 65–99)
Glucose-Capillary: 278 mg/dL — ABNORMAL HIGH (ref 65–99)

## 2017-03-02 LAB — HEPATITIS B SURFACE ANTIGEN: Hepatitis B Surface Ag: NEGATIVE

## 2017-03-02 MED ORDER — HEPARIN 1000 UNIT/ML FOR PERITONEAL DIALYSIS: 500.0000 [IU] | INTRAMUSCULAR | Status: DC | PRN

## 2017-03-02 MED ORDER — HEPARIN 1000 UNIT/ML FOR PERITONEAL DIALYSIS
INTRAMUSCULAR | Status: DC | PRN
  Filled 2017-03-02: qty 5000

## 2017-03-02 MED ORDER — GENTAMICIN SULFATE 0.1 % EX CREA: 1.0000 "application " | TOPICAL_CREAM | Freq: Every day | CUTANEOUS | Status: DC

## 2017-03-02 MED ORDER — DELFLEX-LC/1.5% DEXTROSE 344 MOSM/L IP SOLN
INTRAPERITONEAL | Status: DC
  Administered 2017-03-02: 15:00:00 via INTRAPERITONEAL

## 2017-03-02 NOTE — Progress Notes (Signed)
RT was notified that pt had home CPAP unit on. He wears it every night, RT was unaware because there was no order placed for CPAP. RT went into room to check on pt and his home unit. RT order CPAP and will continue to monitor.

## 2017-03-02 NOTE — Progress Notes (Signed)
Patient ID: Joshua [name], male   DOB: [birth year], 76 y.o.   MRN: [id number] Postoperative day 1 right transtibial amputation with revision left below-the-knee amputation.  Patient complains of moderate pain this morning.  Currently undergoing peritoneal dialysis.  Anticipate discharge to skilled nursing.

## 2017-03-02 NOTE — Progress Notes (Signed)
Subjective:  Soft BPs overnight - UF not recorded but used all 1.5% fluid - U/[name] not done- pt alert  Objective Vital signs in last 24 hours: Vitals:   03/01/17 2045 03/01/17 2218 03/02/17 0612 03/02/17 1017  BP: (!) 135/55 93/68 98/79  (!) 89/55  Pulse: 96 64 97 91  Resp: 19  18 18   Temp: 98.2 F (36.8 C)  98.5 F (36.9 C) 98.3 F (36.8 C)  TempSrc: Oral  Oral Oral  SpO2: 99% 95% 96% 92%  Weight: 96.5 kg (212 lb 11.9 oz)     Height:       Weight change:   Intake/Output Summary (Last 24 hours) at 03/02/2017 1115 Last data filed at 03/02/2017 0900 Gross per 24 hour  Intake 1100 ml  Output 50 ml  Net 1050 ml   Dialysis Orders:  [facility] [place] [facility]. 6 exchanges/day one mid-day exchange (occurs around 9am).  2.5 liters of 2.5% per [facility] 2 liters of 2.5% for last fill and mid day drain. Pt says EDW now is around 148 lbs or 67kg. Dr. 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  Will order labs for tomorrow  3 Hypertension: controlled now- only on low dose coreg- BP is low- be cautious with muscle relaxers and narcotics 4. Anemia of ESRD:  hgb over 11- no ESA for now 5. 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 Malnutrition- albumin in the 1's bring to mind the question of whether PD is still appropriate in this case  7. Family concerned that urine is thick and malodorous.  Will check U/[name] with culture - awaiting U/[name]      [name]    Labs: Basic Metabolic Panel: Recent Labs  Lab 03/01/17 1006  NA 135  K 3.7  CL 99*  CO2 25  GLUCOSE 196*  BUN 28*  CREATININE 4.88*  CALCIUM 9.2   Liver Function Tests: Recent Labs  Lab 03/01/17 1006  AST 38  ALT 18  ALKPHOS 86  BILITOT 0.9  PROT 5.7*  ALBUMIN  1.8*   No results for input(s): LIPASE, AMYLASE in the last 168 hours. No results for input(s): AMMONIA in the last 168 hours. CBC: Recent Labs  Lab 03/01/17 1006  WBC 10.8*  HGB 11.1*  HCT 34.4*  MCV 93.5  PLT 367   Cardiac Enzymes: No results for input(s): CKTOTAL, CKMB, CKMBINDEX, TROPONINI in the last 168 hours. CBG: Recent Labs  Lab 03/01/17 1325 03/01/17 1701 03/01/17 2044 03/01/17 2209 03/02/17 0828  GLUCAP 98 88 121* 158* 278*    Iron Studies: No results for input(s): IRON, TIBC, TRANSFERRIN, FERRITIN in the last 72 hours. Studies/Results: No results found. Medications: Infusions: . sodium chloride 10 mL/hr at 03/01/17 1554  . dialysis solution 1.5% low-MG/low-CA    . methocarbamol (ROBAXIN)  IV      Scheduled Medications: . aspirin EC  81 mg Oral Daily  . atorvastatin  80 mg Oral q1800  . calcium acetate  667 mg Oral TID WC  . carvedilol  6.25 mg Oral QHS  . clopidogrel  75 mg Oral Daily  . docusate sodium  100 mg Oral BID  . gentamicin cream  1 application Topical Daily  . insulin aspart  0-9 Units Subcutaneous TID WC  . insulin aspart  3 Units Subcutaneous TID WC  . insulin glargine  10 Units Subcutaneous QHS  . multivitamin  1 tablet Oral QHS    have reviewed scheduled and prn medications.  Physical Exam: General: alert, chronically ill- talkative Heart: RRR Lungs: mostly clear Abdomen: soft, non tender Extremities: minimal edema- post op with dressings Dialysis Access: PD cath LLQ     03/02/2017,11:15 AM  LOS: 1 day

## 2017-03-03 LAB — RENAL FUNCTION PANEL
Albumin: 1.6 g/dL — ABNORMAL LOW (ref 3.5–5.0)
Anion gap: 10 (ref 5–15)
BUN: 33 mg/dL — ABNORMAL HIGH (ref 6–20)
CHLORIDE: 93 mmol/L — AB (ref 101–111)
CO2: 29 mmol/L (ref 22–32)
CREATININE: 5.55 mg/dL — AB (ref 0.61–1.24)
Calcium: 8.4 mg/dL — ABNORMAL LOW (ref 8.9–10.3)
GFR calc non Af Amer: 9 mL/min — ABNORMAL LOW (ref >60)
GFR, EST AFRICAN AMERICAN: 10 mL/min — AB (ref >60)
GLUCOSE: 350 mg/dL — AB (ref 65–99)
Phosphorus: 3.6 mg/dL (ref 2.5–4.6)
Potassium: 3.5 mmol/L (ref 3.5–5.1)
SODIUM: 132 mmol/L — AB (ref 135–145)

## 2017-03-03 LAB — CBC
HCT: 28.4 % — ABNORMAL LOW (ref 39.0–52.0)
Hemoglobin: 9 g/dL — ABNORMAL LOW (ref 13.0–17.0)
MCH: 30.1 pg (ref 26.0–34.0)
MCHC: 31.7 g/dL (ref 30.0–36.0)
MCV: 95 fL (ref 78.0–100.0)
PLATELETS: 313 10*3/uL (ref 150–400)
RBC: 2.99 MIL/uL — AB (ref 4.22–5.81)
RDW: 15.5 % (ref 11.5–15.5)
WBC: 10.6 10*3/uL — ABNORMAL HIGH (ref 4.0–10.5)

## 2017-03-03 LAB — GLUCOSE, CAPILLARY
GLUCOSE-CAPILLARY: 197 mg/dL — AB (ref 65–99)
Glucose-Capillary: 373 mg/dL — ABNORMAL HIGH (ref 65–99)
Glucose-Capillary: 334 mg/dL — ABNORMAL HIGH (ref 65–99)
Glucose-Capillary: 277 mg/dL — ABNORMAL HIGH (ref 65–99)

## 2017-03-03 MED ORDER — DELFLEX-LC/2.5% DEXTROSE 394 MOSM/L IP SOLN: INTRAPERITONEAL | Status: DC

## 2017-03-03 MED ORDER — DELFLEX-LC/2.5% DEXTROSE 394 MOSM/L IP SOLN
INTRAPERITONEAL | Status: DC
  Administered 2017-03-03: 19:00:00 via INTRAPERITONEAL

## 2017-03-03 MED ORDER — HEPARIN 1000 UNIT/ML FOR PERITONEAL DIALYSIS
INTRAMUSCULAR | Status: DC | PRN
  Filled 2017-03-03: qty 3000

## 2017-03-03 MED ORDER — HEPARIN 1000 UNIT/ML FOR PERITONEAL DIALYSIS
INTRAPERITONEAL | Status: DC | PRN
  Filled 2017-03-03: qty 5000

## 2017-03-03 MED ORDER — DELFLEX-LC/1.5% DEXTROSE 344 MOSM/L IP SOLN: INTRAPERITONEAL | Status: DC

## 2017-03-03 MED ORDER — HEPARIN 1000 UNIT/ML FOR PERITONEAL DIALYSIS
INTRAPERITONEAL | Status: DC | PRN
  Filled 2017-03-03: qty 3000

## 2017-03-03 MED ORDER — HEPARIN 1000 UNIT/ML FOR PERITONEAL DIALYSIS
500.0000 [IU] | INTRAMUSCULAR | Status: DC | PRN
Start: 2017-03-03 — End: 2017-03-03

## 2017-03-03 MED ORDER — PRO-STAT SUGAR FREE PO LIQD
30.0000 mL | Freq: Two times a day (BID) | ORAL | Status: DC
  Administered 2017-03-03 – 2017-03-06 (×6): 30 mL via ORAL
  Filled 2017-03-03 (×6): qty 30

## 2017-03-03 MED ORDER — DARBEPOETIN ALFA 150 MCG/0.3ML IJ SOSY
150.0000 ug | PREFILLED_SYRINGE | INTRAMUSCULAR | Status: DC
  Filled 2017-03-03 (×2): qty 0.3

## 2017-03-03 MED ORDER — DELFLEX-LC/1.5% DEXTROSE 344 MOSM/L IP SOLN
INTRAPERITONEAL | Status: DC
  Administered 2017-03-03: 19:00:00 via INTRAPERITONEAL
  Administered 2017-03-04: 5000 mL via INTRAPERITONEAL

## 2017-03-03 MED ORDER — CIPROFLOXACIN HCL 500 MG PO TABS
500.0000 mg | ORAL_TABLET | Freq: Every day | ORAL | Status: DC
  Administered 2017-03-03 – 2017-03-04 (×2): 500 mg via ORAL
  Filled 2017-03-03 (×2): qty 1

## 2017-03-03 MED ORDER — HEPARIN 1000 UNIT/ML FOR PERITONEAL DIALYSIS
INTRAMUSCULAR | Status: DC | PRN
  Filled 2017-03-03: qty 5000

## 2017-03-03 NOTE — Progress Notes (Signed)
PD tx complete.  Peritoneal catheter capped and clamped.  Total ultrafiltration: 91 mL  Patient tolerated tx well overnight, no c/o.  Will continue to monitor.

## 2017-03-03 NOTE — Progress Notes (Signed)
PD tx initiated via tenckhoff w/o problem, VSS, report given to Shirline FreesAlbert Rios, RN

## 2017-03-03 NOTE — Progress Notes (Signed)
Patient ID: Joshua [name], male   DOB: Apr 15, 1940, 76 y.o.   MRN: [id number] Patient is postoperative day 2 status post revision left transtibial amputation and a new right transtibial amputation.  There is no new drainage in the wound VAC canister.  Anticipate the wound VAC dressings can be removed at the time of discharge to skilled nursing.  Orders written for social worker for skilled nursing placement.  Patient states that he had some pain last night but is otherwise comfortable during the day.

## 2017-03-03 NOTE — Progress Notes (Signed)
[facility] Progress Note   Subjective:  Wt stable. BP still on soft side. No C/Os. Plan for DC to SNF for rehab at some point. Ran essentially even with PD overnight volume wise   Objective Vitals:   03/02/17 1825 03/02/17 2129 03/03/17 0445 03/03/17 0845  BP: (!) 101/47 132/71 (!) 114/42 (!) 106/51  Pulse: (!) 102 (!) 105 73 95  Resp: 18  16 18   Temp: (!) 97.5 F (36.4 C) 98.4 F (36.9 C) 98.1 F (36.7 C) 98 F (36.7 C)  TempSrc: Oral Oral Axillary Oral  SpO2: 97% 95% 91% 99%  Weight: 97.5 kg (214 lb 15.2 oz) 97.5 kg (214 lb 15.2 oz)    Height:       Physical Exam General: Chronically ill appearing male in NAD Heart: S1,S2 RRR Lungs: CTAB slightly decreased in bases.  Abdomen: Active BS Extremities: L BKA revised new R BKA wound vacs in place.  Dialysis Access: PD cath LLQ drsg intact.    Additional Objective Labs: Basic Metabolic Panel: Recent Labs  Lab 03/01/17 1006 03/03/17 0654  NA 135 132*  K 3.7 3.5  CL 99* 93*  CO2 25 29  GLUCOSE 196* 350*  BUN 28* 33*  CREATININE 4.88* 5.55*  CALCIUM 9.2 8.4*  PHOS  --  3.6   Liver Function Tests: Recent Labs  Lab 03/01/17 1006 03/03/17 0654  AST 38  --   ALT 18  --   ALKPHOS 86  --   BILITOT 0.9  --   PROT 5.7*  --   ALBUMIN 1.8* 1.6*   No results for input(s): LIPASE, AMYLASE in the last 168 hours. CBC: Recent Labs  Lab 03/01/17 1006 03/03/17 0654  WBC 10.8* 10.6*  HGB 11.1* 9.0*  HCT 34.4* 28.4*  MCV 93.5 95.0  PLT 367 313   Blood Culture    Component Value Date/Time   SDES WOUND RIGHT GROIN 02/06/2017 1556   SPECREQUEST Immunocompromised 02/06/2017 1556   CULT MODERATE ESCHERICHIA COLI 02/06/2017 1556   REPTSTATUS 02/09/2017 FINAL 02/06/2017 1556    Cardiac Enzymes: No results for input(s): CKTOTAL, CKMB, CKMBINDEX, TROPONINI in the last 168 hours. CBG: Recent Labs  Lab 03/02/17 0828 03/02/17 1210 03/02/17 1659 03/02/17 2129 03/03/17 0804  GLUCAP 278* 260* 170*  237* 373*   Iron Studies: No results for input(s): IRON, TIBC, TRANSFERRIN, FERRITIN in the last 72 hours. @lablastinr3 @ Studies/Results: No results found. Medications: . sodium chloride 10 mL/hr at 03/01/17 1554  . dialysis solution 1.5% low-MG/low-CA    . dialysis solution 1.5% low-MG/low-CA    . methocarbamol (ROBAXIN)  IV     . aspirin EC  81 mg Oral Daily  . atorvastatin  80 mg Oral q1800  . calcium acetate  667 mg Oral TID WC  . carvedilol  6.25 mg Oral QHS  . clopidogrel  75 mg Oral Daily  . docusate sodium  100 mg Oral BID  . gentamicin cream  1 application Topical Daily  . insulin aspart  0-9 Units Subcutaneous TID WC  . insulin aspart  3 Units Subcutaneous TID WC  . insulin glargine  10 Units Subcutaneous QHS  . multivitamin  1 tablet Oral QHS     Dialysis Orders:  [facility] [place] [facility]. 6 exchanges/day one mid-day exchange (occurs around 9am).  2.5 liters of 2.5% per CCPD 2 liters of 2.5% for last fill and mid day drain. Pt says EDW now is around 148 lbs or 67kg. Dr. 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 Dr Lajoyce Cornersuda following. Anticipate DC to SNF for rehab.  2ESRD:normally on PD- followed in [place]- will follow his home script except use 1.5 % fluid and no last bag fill. K+ 3.5 Scr 5.5 BUN 33 - will use half 2.5 % fluids this evening given sodium lower and probably taking in a bit more 3 Hypertension:controlled now- only on low dose coreg-will hold if SBP less than 100- BP is low- be cautious with muscle relaxers and narcotics 4. Anemia of ESRD:hgb dropped to 9-Give Aranesp 150 mcg SQ today.  5. 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 Hold binder.  6. Malnutrition- albumin in the 1's bring to mind the question of whether PD is still appropriate in this case. Add prostat. Change to regular diet.  7. Family  concerned that urine is thick and malodorous. Will check U/A with culture - UA done-nitrite neg, large leuk, no bacteria. WBC 10.6. Urine Cx pending. Afebrile. Probably needs a short course of cipro    Rita H. Brown NP-C 03/03/2017, 9:25 AM  [facility] [phone]  Patient seen and examined, agree with above note with above modifications. Sitting up in chair, can tell has had pain meds- PD going well  Annie SableKellie Exodus Kutzer, MD 03/03/2017

## 2017-03-03 NOTE — Evaluation (Signed)
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  PMH is significant for HTN, CVA, ESRD on PD, DM on insulin, COPD and CHFw AICD, hx CAD/ CABG 2011.    Clinical Impression  Patient presents s/p bilateral trans-tibial amputations with dependencies in mobility and transfers.  Patient currently limited by pain, generalized weakness and decreased balance.  Patient will benefit from continued PT to address mobility and progress toward independence with tranfers.  Feel patient would benefit from CIR to reach max potential.     Follow Up Recommendations CIR    Equipment Recommendations  None recommended by PT    Recommendations for Other Services Rehab consult     Precautions / Restrictions Precautions Precautions: Fall Restrictions Other Position/Activity Restrictions: bilateral transtibial amputations      Mobility  Bed Mobility Overal bed mobility: Needs Assistance Bed Mobility: Supine to Sit     Supine to sit: Mod assist     General bed mobility comments: required physical assistance to reach long sitting in bed.  Unable to maintain long sitting without support  Transfers     Transfers: Licensed conveyancerAnterior-Posterior Transfer       Anterior-Posterior transfers: +2 physical assistance;Mod assist   General transfer comment: unable to lean forward to scoot backwards, once buttocks in chair, able to reposition self and scoot back in chair with mod assist.    Ambulation/Gait                Stairs            Wheelchair Mobility    Modified Rankin (Stroke Patients Only)       Balance Overall balance assessment: Needs assistance Sitting-balance support: Bilateral upper extremity supported Sitting balance-Leahy Scale: Zero                                        Pertinent Vitals/Pain Pain Assessment: 0-10 Pain Score: 7  Pain Location: bilateral LE's Pain Descriptors / Indicators: Aching;Discomfort;Shooting Pain Intervention(s): Limited activity within patient's tolerance;Monitored during session;Premedicated before session    Home Living Family/patient expects to be discharged to:: Private residence Living Arrangements: Spouse/significant other Available Help at Discharge: Family;Available 24 hours/day Type of Home: Mobile home Home Access: Ramped entrance     Home Layout: One level Home Equipment: Wheelchair - manual      Prior Function Level of Independence: Needs assistance   Gait / Transfers Assistance Needed: does sliding board transfers to w/c           Hand Dominance        Extremity/Trunk Assessment   Upper Extremity Assessment Upper Extremity Assessment: Overall WFL for tasks assessed    Lower Extremity Assessment Lower Extremity Assessment: RLE deficits/detail;LLE deficits/detail RLE: Unable to fully assess due to pain LLE: Unable to fully assess due to pain       Communication   Communication: No difficulties  Cognition Arousal/Alertness: Awake/alert Behavior During Therapy: WFL for tasks assessed/performed Overall Cognitive Status: Within Functional Limits for tasks assessed                                        General Comments  Exercises Amputee Exercises Quad Sets: AROM;Both(encouraged patient to perform as pain allows) Knee Flexion: AROM;Both(encouraged patient to perform as pain allows)   Assessment/Plan    PT Assessment Patient needs continued PT services  PT Problem List Decreased strength;Decreased range of motion;Decreased activity tolerance;Decreased balance;Decreased knowledge of use of DME;Decreased mobility;Pain       PT Treatment Interventions DME instruction;Therapeutic activities;Therapeutic exercise;Patient/family  education;Balance training;Functional mobility training;Wheelchair mobility training    PT Goals (Current goals can be found in the Care Plan section)  Acute Rehab PT Goals Patient Stated Goal: hurt less PT Goal Formulation: With patient/family Time For Goal Achievement: 03/17/17 Potential to Achieve Goals: Good    Frequency Min 3X/week   Barriers to discharge        Co-evaluation               AM-PAC PT "6 Clicks" Daily Activity  Outcome Measure Difficulty turning over in bed (including adjusting bedclothes, sheets and blankets)?: A Lot Difficulty moving from lying on back to sitting on the side of the bed? : Unable Difficulty sitting down on and standing up from a chair with arms (e.g., wheelchair, bedside commode, etc,.)?: Unable Help needed moving to and from a bed to chair (including a wheelchair)?: Total Help needed walking in hospital room?: Total Help needed climbing 3-5 steps with a railing? : Total 6 Click Score: 7    End of Session   Activity Tolerance: Patient limited by pain Patient left: in chair;with family/visitor present   PT Visit Diagnosis: Other abnormalities of gait and mobility (R26.89)    Time: 8119-14781055-1115 PT Time Calculation (min) (ACUTE ONLY): 20 min   Charges:   PT Evaluation $PT Eval Moderate Complexity: 1 Mod     PT G Codes:        03/03/2017 [name] [phone]    [name], [name] 03/03/2017, 11:43 AM

## 2017-03-03 NOTE — Progress Notes (Signed)
Rehab Admissions Coordinator Note:  Patient was screened by Trish MageLogue, Rosalba Totty M for appropriateness for an Inpatient Acute Rehab Consult.  At this time, we are recommending Inpatient Rehab consult.  Trish MageLogue, Jujuan Dugo M 03/03/2017, 1:09 PM  I can be reached at [phone].

## 2017-03-04 LAB — GLUCOSE, CAPILLARY
GLUCOSE-CAPILLARY: 389 mg/dL — AB (ref 65–99)
GLUCOSE-CAPILLARY: 450 mg/dL — AB (ref 65–99)
Glucose-Capillary: 431 mg/dL — ABNORMAL HIGH (ref 65–99)
Glucose-Capillary: 281 mg/dL — ABNORMAL HIGH (ref 65–99)
Glucose-Capillary: 322 mg/dL — ABNORMAL HIGH (ref 65–99)

## 2017-03-04 LAB — CBC
HCT: 25.3 % — ABNORMAL LOW (ref 39.0–52.0)
Hemoglobin: 8.1 g/dL — ABNORMAL LOW (ref 13.0–17.0)
MCH: 29.3 pg (ref 26.0–34.0)
MCHC: 32 g/dL (ref 30.0–36.0)
MCV: 91.7 fL (ref 78.0–100.0)
PLATELETS: 287 10*3/uL (ref 150–400)
RBC: 2.76 MIL/uL — ABNORMAL LOW (ref 4.22–5.81)
RDW: 15.4 % (ref 11.5–15.5)
WBC: 12.5 10*3/uL — AB (ref 4.0–10.5)

## 2017-03-04 LAB — BASIC METABOLIC PANEL
ANION GAP: 8 (ref 5–15)
BUN: 37 mg/dL — AB (ref 6–20)
CALCIUM: 7.3 mg/dL — AB (ref 8.9–10.3)
CO2: 25 mmol/L (ref 22–32)
Chloride: 97 mmol/L — ABNORMAL LOW (ref 101–111)
Creatinine, Ser: 4.73 mg/dL — ABNORMAL HIGH (ref 0.61–1.24)
GFR calc Af Amer: 13 mL/min — ABNORMAL LOW (ref >60)
GFR, EST NON AFRICAN AMERICAN: 11 mL/min — AB (ref >60)
GLUCOSE: 341 mg/dL — AB (ref 65–99)
Potassium: 2.5 mmol/L — CL (ref 3.5–5.1)
Sodium: 130 mmol/L — ABNORMAL LOW (ref 135–145)

## 2017-03-04 MED ORDER — HEPARIN 1000 UNIT/ML FOR PERITONEAL DIALYSIS
INTRAPERITONEAL | Status: DC | PRN
  Filled 2017-03-04: qty 3000

## 2017-03-04 MED ORDER — DELFLEX-LC/1.5% DEXTROSE 344 MOSM/L IP SOLN: INTRAPERITONEAL | Status: DC

## 2017-03-04 MED ORDER — SODIUM CHLORIDE 0.9 % IV BOLUS (SEPSIS)
250.0000 mL | Freq: Once | INTRAVENOUS | Status: AC | PRN
  Administered 2017-03-04: 250 mL via INTRAVENOUS

## 2017-03-04 MED ORDER — NEPRO/CARBSTEADY PO LIQD
237.0000 mL | Freq: Two times a day (BID) | ORAL | Status: DC
  Administered 2017-03-04 – 2017-03-06 (×4): 237 mL via ORAL
  Filled 2017-03-04 (×8): qty 237

## 2017-03-04 MED ORDER — HEPARIN 1000 UNIT/ML FOR PERITONEAL DIALYSIS: 500.0000 [IU] | INTRAMUSCULAR | Status: DC | PRN

## 2017-03-04 NOTE — Progress Notes (Signed)
This RN spoke with attending Lajoyce CornersDuda and was told to let Nephrology know that pt is not acting right.   Larey Dayshristy M Lynsie Mcwatters, RN

## 2017-03-04 NOTE — Progress Notes (Signed)
Pt has new onset of confusion. Pt is able to state his name and date of birth, but he does not know where he is and keeps trying to get out of bed. Vitals: BP 84/66, P 105, Temp. 100.2. This RN messaged MD Lajoyce Cornersuda. Awaiting response. Wife is at bedside.

## 2017-03-04 NOTE — Progress Notes (Signed)
Physical Therapy Treatment Patient Details Name: Joshua [name] MRN: [id number] DOB: 1941/02/25 Today's Date: 03/04/2017    History of Present Illness [name] a 76 y.o.malenow s/p bilateral Transtibial ampuations (new right TTA and revision of left).  PMH is significant for HTN, CVA, ESRD on PD, DM on insulin, COPD and CHFw AICD, hx CAD/ CABG 2011.      PT Comments    Pt currently limited by pain. Today's session focused on functional transfer and pt education. Pt educated on importance of proper positioning to encourage knee extension to allow for future use of prostheses. Provided pt with amputee HEP handout and reviewed first two exercises. Plan to continue progressing through HEP in next session. Will continue to follow acutely.     Follow Up Recommendations  CIR     Equipment Recommendations  None recommended by PT    Recommendations for Other Services Rehab consult     Precautions / Restrictions Precautions Precautions: Fall;Other (comment)(BIL wound vac) Precaution Comments: issued amputee HEP Restrictions Other Position/Activity Restrictions: bilateral transtibial amputations    Mobility  Bed Mobility Overal bed mobility: Needs Assistance Bed Mobility: Supine to Sit     Supine to sit: Mod assist     General bed mobility comments: required physical assistance to reach long sitting in bed.  Unable to maintain long sitting without support  Transfers Overall transfer level: Needs assistance   Transfers: Licensed conveyancerAnterior-Posterior Transfer       Anterior-Posterior transfers: +2 physical assistance;Mod assist   General transfer comment: unable to lean forward to scoot backwards. Cueing for technique to use hands to assist in scoot.  Ambulation/Gait                 Stairs            Wheelchair Mobility    Modified Rankin (Stroke Patients Only)       Balance Overall balance assessment: Needs assistance Sitting-balance support:  Bilateral upper extremity supported Sitting balance-Leahy Scale: Zero                                      Cognition Arousal/Alertness: Awake/alert Behavior During Therapy: WFL for tasks assessed/performed Overall Cognitive Status: Within Functional Limits for tasks assessed                                        Exercises Amputee Exercises Quad Sets: AROM;Both;5 reps;Supine Gluteal Sets: AROM;Both;5 reps;Supine    General Comments General comments (skin integrity, edema, etc.): wife present and encouraging pt      Pertinent Vitals/Pain Pain Assessment: Faces Faces Pain Scale: Hurts whole lot Pain Location: bilateral LE's Pain Descriptors / Indicators: Aching;Discomfort;Shooting;Grimacing;Guarding Pain Intervention(s): Monitored during session;Limited activity within patient's tolerance;Repositioned    Home Living                      Prior Function            PT Goals (current goals can now be found in the care plan section) Acute Rehab PT Goals Patient Stated Goal: hurt less PT Goal Formulation: With patient/family Time For Goal Achievement: 03/17/17 Potential to Achieve Goals: Good Progress towards PT goals: Progressing toward goals    Frequency    Min 3X/week      PT Plan Current plan remains  appropriate    Co-evaluation              AM-PAC PT "6 Clicks" Daily Activity  Outcome Measure  Difficulty turning over in bed (including adjusting bedclothes, sheets and blankets)?: A Lot Difficulty moving from lying on back to sitting on the side of the bed? : Unable Difficulty sitting down on and standing up from a chair with arms (e.g., wheelchair, bedside commode, etc,.)?: Unable Help needed moving to and from a bed to chair (including a wheelchair)?: Total Help needed walking in hospital room?: Total Help needed climbing 3-5 steps with a railing? : Total 6 Click Score: 7    End of Session   Activity  Tolerance: Patient limited by pain Patient left: in chair;with family/visitor present Nurse Communication: Mobility status PT Visit Diagnosis: Other abnormalities of gait and mobility (R26.89)     Time: [id number] PT Time Calculation (min) (ACUTE ONLY): 18 min  Charges:  $Therapeutic Activity: 8-22 mins                    G Codes:      [name], [place]PTA Pager [phone] Acute Rehab  [name] 03/04/2017, 10:11 AM

## 2017-03-04 NOTE — Progress Notes (Signed)
CRITICAL VALUE ALERT  Critical Value: CBG:450  Date & Time Notied: 03/04/17 1200  Provider Notified: Lajoyce Cornersuda  Orders Received/Actions taken: MD notified X3. No further orders.

## 2017-03-04 NOTE — Consult Note (Signed)
Physical Medicine and Rehabilitation Consult Reason for Consult: decreased functional mobility Referring Physician: duda   HPI: [name] is a 76 y.o. 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 Pt has been limited by pain, surgeries, and vacs. PM&R was asked to see the patient regarding potential for inpatient rehab admission.    Review of Systems  Constitutional: Negative for chills.  HENT: Negative for hearing loss.   Eyes: Negative for double vision.  Respiratory: Negative for hemoptysis.   Cardiovascular: Negative for palpitations.  Gastrointestinal: Negative for nausea.  Genitourinary: Negative for urgency.  Musculoskeletal: Positive for joint pain and myalgias.  Skin: Negative for itching.  Neurological: Positive for focal weakness. Negative for headaches.  Psychiatric/Behavioral: Negative for suicidal ideas.   Past Medical History:  Diagnosis Date  . AICD (automatic cardioverter/defibrillator) present    [employer] [phone] lead [id number]  . CHF (congestive heart failure) (HCC)   . Complication of anesthesia   . COPD (chronic obstructive pulmonary disease) (HCC)   . Coronary artery disease    CABG 2011  . Diabetes mellitus without complication (HCC)   . ESRD on peritoneal dialysis [facility])    Started PD Dec 2016 in [place]. Nephrology is in [place].   . Gangrene (HCC)    left knee  . GERD (gastroesophageal reflux disease)   . History of peritonitis    PD cath related peritonitis in April 2017  . Hypertension   . Peripheral vascular disease (HCC)   . PONV (postoperative nausea and vomiting)   . Sjogren's syndrome (HCC)    Per pt's wife, was diagnosed in 2016  during w/u for cause of renal failure prior to starting dialysis.  He had a rash on his chest apparently prompting this w/u.  Never had renal bx.  He was referred to a rheum MD in [place] and per the wife he was treated with MTX and other medications but had side effects to "all of it" and isn't taking anything for this as of Jun 2017.   [name] Sleep apnea    wears Bipap  . Stroke (HCC)   . Wound dehiscence    Right foot   Past Surgical History:  Procedure Laterality Date  . ABDOMINAL AORTOGRAM W/LOWER EXTREMITY N/A 11/29/2016   Procedure: ABDOMINAL AORTOGRAM W/LOWER EXTREMITY;  Surgeon: Nada Libman, MD;  Location: [facility] INV[facility];  Service: Cardiovascular;  Laterality: N/A;  . AMPUTATION Left 11/29/2016   Procedure: LEFT THIRD TOE AMPUTATION;  Surgeon: Nada Libman, MD;  Location: [facility];  Service: Vascular;  Laterality: Left;  . AMPUTATION Left 12/23/2016   Procedure: Left Transmetatarsal Amputation;  Surgeon: Nadara Mustard, MD;  Location: [facility] OR;  Service: Orthopedics;  Laterality: Left;  . AMPUTATION Left 01/11/2017   Procedure: LEFT BELOW KNEE AMPUTATION;  Surgeon: Nadara Mustard, MD;  Location: [facility] OR;  Service: Orthopedics;  Laterality: Left;  . AMPUTATION Right 01/20/2017   Procedure: AMPUTATION RIGHT FIFTH TOE;  Surgeon: Nada Libman, MD;  Location: [facility] OR;  Service: Vascular;  Laterality: Right;  . AMPUTATION Right 03/01/2017   Procedure: RIGHT BELOW KNEE AMPUTATION;  Surgeon:  [name], Marcus V, MD;  [facility];  Service: Orthopedics;  Laterality: Right;  . ANGIOPLASTY  12/15/2016   Procedure: BALLOON ANGIOPLASTY;  Surgeon: Maeola Harmanain, Brandon Christopher, MD;  [facility];  Service: Vascular;;  . cardiac catherization with stent placement  2017  . COLONOSCOPY W/ BIOPSIES AND POLYPECTOMY    . CORONARY ARTERY BYPASS GRAFT  2011  . FALSE ANEURYSM REPAIR Right 12/15/2016   Procedure: REPAIR FALSE ANEURYSM;  Surgeon: Maeola Harmanain, Brandon Christopher, MD;  [facility];  Service:  Vascular;  Laterality: Right;  . IRRIGATION AND DEBRIDEMENT FOOT Left 12/15/2016   Procedure: IRRIGATION AND DEBRIDEMENT FOOT;  Surgeon: Maeola Harmanain, Brandon Christopher, MD;  [facility];  Service: Vascular;  Laterality: Left;  . LEFT HEART CATH AND CORS/GRAFTS ANGIOGRAPHY N/A 01/09/2017   Procedure: LEFT HEART CATH AND CORS/GRAFTS ANGIOGRAPHY;  Surgeon: Runell GessBerry, Jonathan J, MD;  [facility];  Service: Cardiovascular;  Laterality: N/A;  . LOWER EXTREMITY ANGIOGRAM Left 12/15/2016   Procedure: LOWER EXTREMITY ANGIOGRAM; PEDAL ACCESS;  Surgeon: Maeola Harmanain, Brandon Christopher, MD;  [facility];  Service: Vascular;  Laterality: Left;  . PERIPHERAL VASCULAR BALLOON ANGIOPLASTY  11/29/2016   Procedure: PERIPHERAL VASCULAR BALLOON ANGIOPLASTY;  Surgeon: Nada LibmanBrabham, Vance W, MD;  [facility];  Service: Cardiovascular;;  LT Peroneal AT attempted unsuccessful  . STUMP REVISION Left 03/01/2017   Procedure: REVISION LEFT BELOW KNEE AMPUTATION;  Surgeon: [name], Marcus V, MD;  [facility];  Service: Orthopedics;  Laterality: Left;   Family History  Problem Relation Age of Onset  . Heart disease Father   . Diabetes Mother    Social History:  reports that he has quit smoking. His smoking use included pipe. he has never used smokeless tobacco. He reports that he does not drink alcohol or use drugs. Allergies:  Allergies  Allergen Reactions  . Gabapentin (Once-Daily) Hypertension  . Lisinopril Hives  . Methotrexate Derivatives Other (See Comments)    blisters  . Sulfa Antibiotics Hives  . Tape Hives  . Vancomycin Other (See Comments)    Blisters   . Zanaflex [Tizanidine Hcl]     Hypotensive stroke   Medications Prior to Admission  Medication Sig Dispense Refill  . aspirin EC 81 MG tablet Take 81 mg by mouth daily.    [name]. atorvastatin (LIPITOR) 80 MG tablet Take 1 tablet (80 mg total) by mouth daily at 6 PM. 30 tablet 0  . calcium acetate (PHOSLO) 667 MG capsule Take 1 capsule  (667 mg total) by mouth 3 (three) times daily with meals. 90 capsule 0  . carvedilol (COREG) 6.25 MG tablet Take 1 tablet (6.25 mg total) by mouth at bedtime. 30 tablet 0  . clopidogrel (PLAVIX) 75 MG tablet Take 1 tablet (75 mg total) by mouth daily. 30 tablet 0  . cyclobenzaprine (FLEXERIL) 10 MG tablet Take 1 tablet (10 mg total) 2 (two) times daily as needed by mouth for muscle spasms. 30 tablet 0  . docusate sodium (COLACE) 100 MG capsule Take 100 mg by mouth 2 (two) times daily.     . insulin NPH Human (HUMULIN N,NOVOLIN N) 100 UNIT/ML injection Inject 0.1 mLs (10 Units total) into the skin daily before breakfast. 10 mL 11  . insulin NPH Human (HUMULIN N,NOVOLIN N) 100 UNIT/ML injection Inject 0.65 mLs (65 Units total) into the skin at bedtime. 10 mL 11  . multivitamin (RENA-VIT) TABS tablet Take 1 tablet by mouth at bedtime.  0  . oxyCODONE (OXY IR/ROXICODONE)  5 MG immediate release tablet Take 0.5 tablets (2.5 mg total) by mouth every 6 (six) hours as needed for severe pain. 20 tablet 0  . polyethylene glycol (MIRALAX / GLYCOLAX) packet Take 17 g by mouth daily.    [name] RELION INSULIN SYRINGE 31G X 15/64" 1 ML MISC     . silver sulfADIAZINE (SILVADENE) 1 % cream Apply 1 application daily topically. 50 g 0  . Vitamin D, Ergocalciferol, (DRISDOL) 50000 units CAPS capsule Take 50,000 Units by mouth every 30 (thirty) days.     . nitroGLYCERIN (NITROSTAT) 0.4 MG SL tablet Place 0.4 mg under the tongue every 5 (five) minutes as needed for chest pain.      Home: Home Living Family/patient expects to be discharged to:: Private residence Living Arrangements: Spouse/significant other Available Help at Discharge: Family, Available 24 hours/day Type of Home: Mobile home Home Access: Ramped entrance Home Layout: One level Home Equipment: Wheelchair - manual  Functional History: Prior Function Level of Independence: Needs assistance Gait / Transfers Assistance Needed: does sliding board transfers  to w/c Functional Status:  Mobility: Bed Mobility Overal bed mobility: Needs Assistance Bed Mobility: Supine to Sit Supine to sit: Mod assist General bed mobility comments: required physical assistance to reach long sitting in bed.  Unable to maintain long sitting without support Transfers Transfers: Counselling psychologist transfers: +2 physical assistance, Mod assist General transfer comment: unable to lean forward to scoot backwards, once buttocks in chair, able to reposition self and scoot back in chair with mod assist.        ADL:    Cognition: Cognition Overall Cognitive Status: Within Functional Limits for tasks assessed Orientation Level: Oriented X4 Cognition Arousal/Alertness: Awake/alert Behavior During Therapy: WFL for tasks assessed/performed Overall Cognitive Status: Within Functional Limits for tasks assessed  Blood pressure 109/84, pulse 100, temperature 99.3 F (37.4 C), temperature source Oral, resp. rate 18, height 5\' 9"  (1.753 m), weight 99.3 kg (218 lb 14.7 oz), SpO2 98 %. Physical Exam  Constitutional: He appears well-developed.  HENT:  Head: Normocephalic.  Eyes: EOM are normal.  Neck: No thyromegaly present.  Cardiovascular: Normal rate.  Respiratory: No respiratory distress.  GI: He exhibits no distension.  Musculoskeletal:  Both amputation sites are connected to vacuum pumps.  Dressing is sealed.  Legs are somewhat edematous  Neurological: No cranial nerve deficit.  Upper extremity motor exam grossly 5 out of 5.  Cognitively is intact  Psychiatric:  Affect slightly flat    Results for orders placed or performed during the hospital encounter of 03/01/17 (from the past 24 hour(s))  Glucose, capillary     Status: Abnormal   Collection Time: 03/03/17 11:48 AM  Result Value Ref Range   Glucose-Capillary 334 (H) 65 - 99 mg/dL  Glucose, capillary     Status: Abnormal   Collection Time: 03/03/17  4:42 PM  Result Value Ref  Range   Glucose-Capillary 197 (H) 65 - 99 mg/dL  Glucose, capillary     Status: Abnormal   Collection Time: 03/03/17  9:23 PM  Result Value Ref Range   Glucose-Capillary 277 (H) 65 - 99 mg/dL  Glucose, capillary     Status: Abnormal   Collection Time: 03/04/17  7:32 AM  Result Value Ref Range   Glucose-Capillary 389 (H) 65 - 99 mg/dL   No results found.  Assessment/Plan: Diagnosis: Status post left below-knee amputation revision and status post new right below-knee amputation 1. Does the need for close, 24 hr/day medical supervision in concert  with the patient's rehab needs make it unreasonable for this patient to be served in a less intensive setting? Yes 2. Co-Morbidities requiring supervision/potential complications: Postoperative pain considerations, diabetes type 2, end-stage renal disease on peritoneal dialysis 3. Due to bladder management, bowel management, safety, skin/wound care, disease management, medication administration, pain management and patient education, does the patient require 24 hr/day rehab nursing? Yes 4. Does the patient require coordinated care of a physician, rehab nurse, PT (1-2 hrs/day, 5 days/week) and OT (1-2 hrs/day, 5 days/week) to address physical and functional deficits in the context of the above medical diagnosis(es)? Yes Addressing deficits in the following areas: balance, endurance, locomotion, strength, transferring, bowel/bladder control, bathing, dressing, feeding, grooming, toileting and psychosocial support 5. Can the patient actively participate in an intensive therapy program of at least 3 hrs of therapy per day at least 5 days per week? Yes 6. The potential for patient to make measurable gains while on inpatient rehab is excellent 7. Anticipated functional outcomes upon discharge from inpatient rehab are modified independent and supervision  with PT, modified independent, supervision and min assist with OT, n/a with SLP. 8. Estimated rehab length  of stay to reach the above functional goals is: 8-13 days 9. Anticipated D/C setting: Home 10. Anticipated post D/C treatments: HH therapy 11. Overall Rehab/Functional Prognosis: excellent  RECOMMENDATIONS: This patient's condition is appropriate for continued rehabilitative care in the following setting: CIR Patient has agreed to participate in recommended program. Yes Note that insurance prior authorization may be required for reimbursement for recommended care.  Comment: Rehab Admissions Coordinator to follow up.  Thanks,  Ranelle OysterZachary T. Swartz, MD, [place] DomFAAPMR    Ranelle OysterSWARTZ,ZACHARY T, MD 03/04/2017

## 2017-03-04 NOTE — Progress Notes (Signed)
PD tx completed overnight with no issues.  Post drain weight 99.3 kg on bed scale.  UF 1116 mL.  Will continue to monitor.

## 2017-03-04 NOTE — Progress Notes (Signed)
This RN let Rapid Response know about pt AMS and vitals. Also, called Nephrology and awaiting on response.    Larey Dayshristy M Ayra Hodgdon, RN

## 2017-03-04 NOTE — Progress Notes (Signed)
[facility] Progress Note   Subjective:  Seen in room, finishing CCPD now and no complaints regarding that. 1116mL UF overnight with PD. No CP or dyspnea. + mild B stump pain, stable.   Objective Vitals:   03/03/17 1833 03/03/17 2145 03/04/17 0458 03/04/17 0834  BP: (!) 122/93 (!) 117/48 (!) 116/57 109/84  Pulse: 85 (!) 105 98 100  Resp: 20 18 16 18   Temp: 98.1 F (36.7 C) 97.9 F (36.6 C) 99.4 F (37.4 C) 99.3 F (37.4 C)  TempSrc: Oral Oral Oral Oral  SpO2: 98% 96% 97% 98%  Weight: 99.2 kg (218 lb 11.1 oz) 99.3 kg (218 lb 14.7 oz)    Height:       Physical Exam General: Well appearing man, NAD Heart: RRR; no murmur Lungs: CTAB Abdomen: soft, non-tender. PD cath in place. Extremities: No stump edema, L BKA and new R BKA with wound vacs in place. Dialysis Access: PD cath, also with L AVF + thrill  Additional Objective Labs: Basic Metabolic Panel: Recent Labs  Lab 03/01/17 1006 03/03/17 0654  NA 135 132*  K 3.7 3.5  CL 99* 93*  CO2 25 29  GLUCOSE 196* 350*  BUN 28* 33*  CREATININE 4.88* 5.55*  CALCIUM 9.2 8.4*  PHOS  --  3.6   Liver Function Tests: Recent Labs  Lab 03/01/17 1006 03/03/17 0654  AST 38  --   ALT 18  --   ALKPHOS 86  --   BILITOT 0.9  --   PROT 5.7*  --   ALBUMIN 1.8* 1.6*   CBC: Recent Labs  Lab 03/01/17 1006 03/03/17 0654  WBC 10.8* 10.6*  HGB 11.1* 9.0*  HCT 34.4* 28.4*  MCV 93.5 95.0  PLT 367 313   CBG: Recent Labs  Lab 03/03/17 0804 03/03/17 1148 03/03/17 1642 03/03/17 2123 03/04/17 0732  GLUCAP 373* 334* 197* 277* 389*   Medications: . sodium chloride 10 mL/hr at 03/01/17 1554  . dialysis solution 1.5% low-MG/low-CA    . dialysis solution 2.5% low-MG/low-CA     . aspirin EC  81 mg Oral Daily  . atorvastatin  80 mg Oral q1800  . carvedilol  6.25 mg Oral QHS  . ciprofloxacin  500 mg Oral Daily  . clopidogrel  75 mg Oral Daily  . darbepoetin (ARANESP) injection - NON-DIALYSIS  150 mcg  Subcutaneous Q Fri-1800  . docusate sodium  100 mg Oral BID  . feeding supplement (PRO-STAT SUGAR FREE 64)  30 mL Oral BID  . gentamicin cream  1 application Topical Daily  . insulin aspart  0-9 Units Subcutaneous TID WC  . insulin aspart  3 Units Subcutaneous TID WC  . insulin glargine  10 Units Subcutaneous QHS  . multivitamin  1 tablet Oral QHS    Dialysis Orders: [facility] [place] [facility] 6 exchanges/day one mid-day exchange (occurs around 9am).  2.5 liters of 2.5% per CCPD 2 liters of 2.5% for last fill and mid day drain. Pt says EDW now is around 148 lbs or 67kg (? seems way off from weights here) Dr. Denny PeonBaveja  Assessment/Plan:76 year old WM with multiple medical issues including ESRD on PD.  1. PAD (s/p revision of left BKA and new right BKA on 11/21): Dr Lajoyce Cornersuda following. Anticipate DC to SNF for rehab, possibly CIR.  2. ESRD:on CCPD, [place] in [place]. Following home CCPD Rx except 1/2 1.5%, 1/2 2.5% fluids. 3. Hypertension:Controlled now- only on low dose coreg-will hold if SBP less than 100-  BP is low- be cautious with muscle relaxers and narcotics 4. 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 S/p Aranesp 150 mcg SQ 11/23. 5. Metabolic Bone Disease:Phoslo on hold for slightly low Phos. Ca ok. Follow labs. 6. Malnutrition: Alb remains very low in 1's, bring to mind the question of whether PD is still appropriate in this case. Continue Pro-stat, adding Nepro. 7. ?UTI: Urinalysis concerning, UCx pending. Getting short-course of Cipro.    Ozzie HoyleKatie Stovall, PA-C 03/04/2017, 9:15 AM  [facility] Pager: [phone]  Patient seen and examined, agree with above note with above modifications. No PD issues- possibly to CIR.  Same PD orders for tonight  Annie SableKellie Jaymari Cromie, MD 03/04/2017

## 2017-03-04 NOTE — Progress Notes (Signed)
Rapid came and assessed pt. Also, this nurse spoke with Nephrology and was told that the attending would have to get new orders. Rapid spoke with the attending Lajoyce Cornersuda and MD stated that he would get in touch with [facility] to get them involved. Blood cultures were ordered. Pt is resting comfortably at this time. Will continue to monitor.   Larey Dayshristy M Timeka Goette, RN

## 2017-03-05 ENCOUNTER — Inpatient Hospital Stay (HOSPITAL_COMMUNITY): Payer: Medicare Other

## 2017-03-05 DIAGNOSIS — R6510 Systemic inflammatory response syndrome (SIRS) of non-infectious origin without acute organ dysfunction: Secondary | ICD-10-CM | POA: Diagnosis present

## 2017-03-05 LAB — GLUCOSE, CAPILLARY
GLUCOSE-CAPILLARY: 230 mg/dL — AB (ref 65–99)
Glucose-Capillary: 394 mg/dL — ABNORMAL HIGH (ref 65–99)
Glucose-Capillary: 337 mg/dL — ABNORMAL HIGH (ref 65–99)
Glucose-Capillary: 329 mg/dL — ABNORMAL HIGH (ref 65–99)

## 2017-03-05 LAB — LACTIC ACID, PLASMA
LACTIC ACID, VENOUS: 1.4 mmol/L (ref 0.5–1.9)
Lactic Acid, Venous: 1.9 mmol/L (ref 0.5–1.9)

## 2017-03-05 LAB — PROCALCITONIN: Procalcitonin: 0.41 ng/mL

## 2017-03-05 LAB — TROPONIN I
Troponin I: 0.17 ng/mL (ref <0.03)
Troponin I: 0.15 ng/mL (ref <0.03)

## 2017-03-05 LAB — MAGNESIUM: Magnesium: 1.6 mg/dL — ABNORMAL LOW (ref 1.7–2.4)

## 2017-03-05 MED ORDER — GENTAMICIN SULFATE 0.1 % EX CREA
1.0000 "application " | TOPICAL_CREAM | Freq: Every day | CUTANEOUS | Status: DC
  Administered 2017-03-05 (×2): 1 via TOPICAL
  Filled 2017-03-05: qty 15

## 2017-03-05 MED ORDER — POTASSIUM CHLORIDE 10 MEQ/100ML IV SOLN
10.0000 meq | Freq: Once | INTRAVENOUS | Status: AC
  Administered 2017-03-05: 10 meq via INTRAVENOUS
  Filled 2017-03-05: qty 100

## 2017-03-05 MED ORDER — POTASSIUM CHLORIDE CRYS ER 20 MEQ PO TBCR
40.0000 meq | EXTENDED_RELEASE_TABLET | Freq: Once | ORAL | Status: AC
  Administered 2017-03-05: 40 meq via ORAL
  Filled 2017-03-05: qty 2

## 2017-03-05 MED ORDER — POTASSIUM CHLORIDE 10 MEQ/100ML IV SOLN
10.0000 meq | INTRAVENOUS | Status: AC
  Administered 2017-03-05 (×2): 10 meq via INTRAVENOUS
  Filled 2017-03-05: qty 100

## 2017-03-05 MED ORDER — DELFLEX-LC/1.5% DEXTROSE 344 MOSM/L IP SOLN: INTRAPERITONEAL | Status: DC

## 2017-03-05 MED ORDER — POTASSIUM CHLORIDE 10 MEQ/100ML IV SOLN
INTRAVENOUS | Status: AC
  Filled 2017-03-05: qty 100

## 2017-03-05 MED ORDER — LINEZOLID 600 MG/300ML IV SOLN
600.0000 mg | Freq: Two times a day (BID) | INTRAVENOUS | Status: DC
  Administered 2017-03-05 – 2017-03-06 (×4): 600 mg via INTRAVENOUS
  Filled 2017-03-05 (×6): qty 300

## 2017-03-05 MED ORDER — SODIUM CHLORIDE 0.9 % IV BOLUS (SEPSIS)
250.0000 mL | Freq: Once | INTRAVENOUS | Status: AC
  Administered 2017-03-05: 250 mL via INTRAVENOUS

## 2017-03-05 MED ORDER — PIPERACILLIN-TAZOBACTAM IN DEX 2-0.25 GM/50ML IV SOLN
2.2500 g | Freq: Three times a day (TID) | INTRAVENOUS | Status: DC
  Administered 2017-03-05 – 2017-03-06 (×6): 2.25 g via INTRAVENOUS
  Filled 2017-03-05 (×8): qty 50

## 2017-03-05 NOTE — Progress Notes (Signed)
[facility] Progress Note   Subjective:  Overnight low grade temp, confusion and hypotension- [facility] consulted- abx broadened to zosyn- was slightly positive with PD - sugar 400   Objective Vitals:   03/04/17 2142 03/04/17 2311 03/05/17 0527 03/05/17 0811  BP: (!) 84/66 (!) 113/56 (!) 132/51 111/76  Pulse: (!) 105 (!) 102 91 73  Resp: 18 20 18 18   Temp: 100.2 F (37.9 C)  97.8 F (36.6 C) 98.4 F (36.9 C)  TempSrc: Oral  Oral Oral  SpO2: 97% 97% 99% 98%  Weight: 98.6 kg (217 lb 6 oz)     Height:       Physical Exam General: Well appearing man, NAD Heart: RRR; no murmur Lungs: CTAB Abdomen: soft, non-tender. PD cath in place. Extremities: No stump edema, L BKA and new R BKA with wound vacs in place. Dialysis Access: PD cath, also with L AVF + thrill  Additional Objective Labs: Basic Metabolic Panel: Recent Labs  Lab 03/01/17 1006 03/03/17 0654 03/04/17 2247  NA 135 132* 130*  K 3.7 3.5 2.5*  CL 99* 93* 97*  CO2 25 29 25   GLUCOSE 196* 350* 341*  BUN 28* 33* 37*  CREATININE 4.88* 5.55* 4.73*  CALCIUM 9.2 8.4* 7.3*  PHOS  --  3.6  --    Liver Function Tests: Recent Labs  Lab 03/01/17 1006 03/03/17 0654  AST 38  --   ALT 18  --   ALKPHOS 86  --   BILITOT 0.9  --   PROT 5.7*  --   ALBUMIN 1.8* 1.6*   CBC: Recent Labs  Lab 03/01/17 1006 03/03/17 0654 03/04/17 2247  WBC 10.8* 10.6* 12.5*  HGB 11.1* 9.0* 8.1*  HCT 34.4* 28.4* 25.3*  MCV 93.5 95.0 91.7  PLT 367 313 287   CBG: Recent Labs  Lab 03/04/17 1150 03/04/17 1343 03/04/17 1729 03/04/17 2137 03/05/17 0738  GLUCAP 450* 431* 281* 322* 394*   Medications: . sodium chloride 10 mL/hr at 03/01/17 1554  . dialysis solution 1.5% low-MG/low-CA    . dialysis solution 2.5% low-MG/low-CA    . linezolid (ZYVOX) IV Stopped (03/05/17 0327)  . piperacillin-tazobactam (ZOSYN)  IV 2.25 g (03/05/17 0558)   . aspirin EC  81 mg Oral Daily  . atorvastatin  80 mg Oral q1800  . carvedilol   6.25 mg Oral QHS  . clopidogrel  75 mg Oral Daily  . darbepoetin (ARANESP) injection - NON-DIALYSIS  150 mcg Subcutaneous Q Fri-1800  . docusate sodium  100 mg Oral BID  . feeding supplement (NEPRO CARB STEADY)  237 mL Oral BID BM  . feeding supplement (PRO-STAT SUGAR FREE 64)  30 mL Oral BID  . gentamicin cream  1 application Topical Daily  . insulin aspart  0-9 Units Subcutaneous TID WC  . insulin aspart  3 Units Subcutaneous TID WC  . insulin glargine  10 Units Subcutaneous QHS  . multivitamin  1 tablet Oral QHS    Dialysis Orders: [facility]: [place] [facility]. 6 exchanges/day one mid-day exchange (occurs around 9am).  2.5 liters of 2.5% per CCPD 2 liters of 2.5% for last fill and mid day drain. Dr. Denny PeonBaveja  Assessment/Plan:76 year old WM with multiple medical issues including ESRD on PD.  1. PAD (s/p revision of left BKA and new right BKA on 11/21): Dr Lajoyce Cornersuda following. Anticipate DC to SNF for rehab, possibly CIR.  2. ESRD:on CCPD, [place]. Following home CCPD Rx except 1/2 1.5%, 1/2 2.5% fluids.  Given hypotension will change back to all 1.5% 3. Hypertension:Controlled now- only on low dose coreg-will hold if SBP less than 100- BP is low- be cautious with muscle relaxers and narcotics 4. Anemia of ESRD:Hgb dropped to 8.1. S/p Aranesp 150 mcg SQ 11/23. 5. Metabolic Bone Disease:Phoslo on hold for slightly low Phos. Ca ok. Follow labs. 6. Malnutrition: Alb remains very low in 1's, bring to mind the question of whether PD is still appropriate in this case. Continue Pro-stat, adding Nepro. 7. ?UTI: Urinalysis concerning, UCx pending. Getting short-course of Cipro.  8. Overnight events- low grade temp/confusion/hypotension- bld cultures pending- abx broadened  9. DM- needs tighter sugar control   Annie SableKellie Marrion Accomando, MD 03/05/2017

## 2017-03-05 NOTE — Progress Notes (Signed)
Spoke with wife and she does not want this RN to continue to remove wound vac dressing. Pt was in excruciating pain when this RN and another RN began to remove dressing. Wife would like for [name] to remove the dressing incase the incision was to open up or there was some problem to arise that he would be there. Pt is resting comfortably at this time and incision is still covered. Will make on coming RN aware.   Larey Dayshristy M Makenzy Krist, RN

## 2017-03-05 NOTE — Progress Notes (Signed)
CRITICAL VALUE ALERT  Critical Value:  Troponin 0.17   Date & Time Notied:  0200  03/05/2017  Provider Notified: Jaci StandardKakarkandy  Orders Received/Actions taken: No new orders

## 2017-03-05 NOTE — Progress Notes (Signed)
Dr. Jaci StandardKakarkandy came and assessed pt after speaking with Lajoyce Cornersuda. Orders were placed. Will continue to monitor.   Larey Dayshristy M Denaya Horn, RN

## 2017-03-05 NOTE — Progress Notes (Signed)
Will let oncoming RN know to see if pt needs Heparin injections and to have [facility] change to the attending if we are getting orders through them.   Larey Dayshristy M Britany Callicott, RN

## 2017-03-05 NOTE — Progress Notes (Signed)
Pt wife was wanting to know if pt needs to be getting Heparin SQ injections. This RN messaged NP on call to see if order needs to be put in.   Larey Dayshristy M Galan Ghee, RN

## 2017-03-05 NOTE — Progress Notes (Signed)
Patient seen and evaluated earlier this am. No breakthrough fevers. No complaints of chest pain reported to me.   Hypokalemia - will check magnesium levels - K dur ordered for today. Will order some K rounds via IV replacement  SIRs - continue antibiotic therapy with Zosyn was given Zyvox due to vancomycin allergy.   Will reassess next am.  Penny PiaVEGA, Zaleigh Bermingham

## 2017-03-05 NOTE — Progress Notes (Signed)
Pt has home cpap and places self on/off.  RT will monitor. 

## 2017-03-05 NOTE — Consult Note (Signed)
Reason for Consult: Fever and hypotension. Referring Physician: Dr. Sharol Given.  [name] is an 76 y.o. male.  HPI: History of ESRD on peritoneal dialysis, CAD status post CABG, diabetes mellitus type 2, AICD placement who had bilateral BKA 3 days ago for wound dehiscence and nonhealing ulcers was found to be hypotensive confused and febrile this evening.  Patient blood pressure improved with 250 normal saline boluses.  Blood cultures were obtained.  Consult was called for further management of possible sepsis.  On my exam patient appears alert awake oriented mildly confused but moves all extremities.  Denies any abdominal pain but does have pain at the surgical site and the bowel below knee amputation.  Dialysis catheter site around the abdomen does not have any active discharge.  Abdomen appears benign.  Patient denies any chest pain or shortness of breath.  Past Medical History:  Diagnosis Date  . AICD (automatic cardioverter/defibrillator) present    [employer] [phone] lead [id number]  . CHF (congestive heart failure) ([place])   . Complication of anesthesia   . COPD (chronic obstructive pulmonary disease) ([place])   . Coronary artery disease    CABG 2011  . Diabetes mellitus without complication ([place])   . ESRD on peritoneal dialysis [facility])    Started PD Dec 2016 in [place]. Nephrology is in [place].   . Gangrene ([place])    left knee  . GERD (gastroesophageal reflux disease)   . History of peritonitis    PD cath related peritonitis in April 2017  . Hypertension   . Peripheral vascular disease ([place])   . PONV (postoperative nausea and vomiting)   . Sjogren's syndrome ([place])    Per pt's wife, was diagnosed in 2016 during w/u for cause of renal failure prior to starting dialysis.  He had a rash on his chest apparently prompting this w/u.  Never had renal bx.  He was referred to a rheum MD in [place] and per the wife he was treated with MTX and other medications but had side  effects to "all of it" and isn't taking anything for this as of Jun 2017.   [name] Sleep apnea    wears Bipap  . Stroke ([place])   . Wound dehiscence    Right foot    Past Surgical History:  Procedure Laterality Date  . ABDOMINAL AORTOGRAM W/LOWER EXTREMITY N/A 11/29/2016   Procedure: ABDOMINAL AORTOGRAM W/LOWER EXTREMITY;  Surgeon: Serafina Mitchell, MD;  Location: [place] [facility];  Service: Cardiovascular;  Laterality: N/A;  . AMPUTATION Left 11/29/2016   Procedure: LEFT THIRD TOE AMPUTATION;  Surgeon: Serafina Mitchell, MD;  Location: [facility] OR;  Service: Vascular;  Laterality: Left;  . AMPUTATION Left 12/23/2016   Procedure: Left Transmetatarsal Amputation;  Surgeon: Newt Minion, MD;  Location: [place];  Service: Orthopedics;  Laterality: Left;  . AMPUTATION Left 01/11/2017   Procedure: LEFT BELOW KNEE AMPUTATION;  Surgeon: Newt Minion, MD;  Location: [place];  Service: Orthopedics;  Laterality: Left;  . AMPUTATION Right 01/20/2017   Procedure: AMPUTATION RIGHT FIFTH TOE;  Surgeon: Serafina Mitchell, MD;  Location: [place];  Service: Vascular;  Laterality: Right;  . AMPUTATION Right 03/01/2017   Procedure: RIGHT BELOW KNEE AMPUTATION;  Surgeon: Newt Minion, MD;  Location: [place];  Service: Orthopedics;  Laterality: Right;  . ANGIOPLASTY  12/15/2016   Procedure: BALLOON ANGIOPLASTY;  Surgeon: Waynetta Sandy, MD;  Location: [place];  Service: Vascular;;  . cardiac catherization  with stent placement  2017  . COLONOSCOPY W/ BIOPSIES AND POLYPECTOMY    . CORONARY ARTERY BYPASS GRAFT  2011  . FALSE ANEURYSM REPAIR Right 12/15/2016   Procedure: REPAIR FALSE ANEURYSM;  Surgeon: Waynetta Sandy, MD;  Location: [place];  Service: Vascular;  Laterality: Right;  . IRRIGATION AND DEBRIDEMENT FOOT Left 12/15/2016   Procedure: IRRIGATION AND DEBRIDEMENT FOOT;  Surgeon: Waynetta Sandy, MD;  Location: [place];  Service: Vascular;  Laterality: Left;  . LEFT HEART CATH AND CORS/GRAFTS  ANGIOGRAPHY N/A 01/09/2017   Procedure: LEFT HEART CATH AND CORS/GRAFTS ANGIOGRAPHY;  Surgeon: Lorretta Harp, MD;  Location: [place] [facility];  Service: Cardiovascular;  Laterality: N/A;  . LOWER EXTREMITY ANGIOGRAM Left 12/15/2016   Procedure: LOWER EXTREMITY ANGIOGRAM; PEDAL ACCESS;  Surgeon: Waynetta Sandy, MD;  Location: [place];  Service: Vascular;  Laterality: Left;  . PERIPHERAL VASCULAR BALLOON ANGIOPLASTY  11/29/2016   Procedure: PERIPHERAL VASCULAR BALLOON ANGIOPLASTY;  Surgeon: Serafina Mitchell, MD;  Location: [place] [facility];  Service: Cardiovascular;;  LT Peroneal AT attempted unsuccessful  . STUMP REVISION Left 03/01/2017   Procedure: REVISION LEFT BELOW KNEE AMPUTATION;  Surgeon: Newt Minion, MD;  Location: [place];  Service: Orthopedics;  Laterality: Left;    Family History  Problem Relation Age of Onset  . Heart disease Father   . Diabetes Mother     Social History:  reports that he has quit smoking. His smoking use included pipe. he has never used smokeless tobacco. He reports that he does not drink alcohol or use drugs.  Allergies:  Allergies  Allergen Reactions  . Gabapentin (Once-Daily) Hypertension  . Lisinopril Hives  . Methotrexate Derivatives Other (See Comments)    blisters  . Sulfa Antibiotics Hives  . Tape Hives  . Vancomycin Other (See Comments)    Blisters   . Zanaflex [Tizanidine Hcl]     Hypotensive stroke    Medications: I have reviewed the patient's current medications.  Results for orders placed or performed during the hospital encounter of 03/01/17 (from the past 48 hour(s))  Renal function panel     Status: Abnormal   Collection Time: 03/03/17  6:54 AM  Result Value Ref Range   Sodium 132 (L) 135 - 145 mmol/L   Potassium 3.5 3.5 - 5.1 mmol/L   Chloride 93 (L) 101 - 111 mmol/L   CO2 29 22 - 32 mmol/L   Glucose, Bld 350 (H) 65 - 99 mg/dL   BUN 33 (H) 6 - 20 mg/dL   Creatinine, Ser 5.55 (H) 0.61 - 1.24 mg/dL   Calcium  8.4 (L) 8.9 - 10.3 mg/dL   Phosphorus 3.6 2.5 - 4.6 mg/dL   Albumin 1.6 (L) 3.5 - 5.0 g/dL   GFR calc non Af Amer 9 (L) >60 mL/min   GFR calc Af Amer 10 (L) >60 mL/min    Comment: (NOTE) The eGFR has been calculated using the CKD EPI equation. This calculation has not been validated in all clinical situations. eGFR's persistently <60 mL/min signify possible Chronic Kidney Disease.    Anion gap 10 5 - 15  CBC     Status: Abnormal   Collection Time: 03/03/17  6:54 AM  Result Value Ref Range   WBC 10.6 (H) 4.0 - 10.5 K/uL   RBC 2.99 (L) 4.22 - 5.81 MIL/uL   Hemoglobin 9.0 (L) 13.0 - 17.0 g/dL   HCT 28.4 (L) 39.0 - 52.0 %   MCV 95.0 78.0 -  100.0 fL   MCH 30.1 26.0 - 34.0 pg   MCHC 31.7 30.0 - 36.0 g/dL   RDW 15.5 11.5 - 15.5 %   Platelets 313 150 - 400 K/uL  Glucose, capillary     Status: Abnormal   Collection Time: 03/03/17  8:04 AM  Result Value Ref Range   Glucose-Capillary 373 (H) 65 - 99 mg/dL  Glucose, capillary     Status: Abnormal   Collection Time: 03/03/17 11:48 AM  Result Value Ref Range   Glucose-Capillary 334 (H) 65 - 99 mg/dL  Glucose, capillary     Status: Abnormal   Collection Time: 03/03/17  4:42 PM  Result Value Ref Range   Glucose-Capillary 197 (H) 65 - 99 mg/dL  Glucose, capillary     Status: Abnormal   Collection Time: 03/03/17  9:23 PM  Result Value Ref Range   Glucose-Capillary 277 (H) 65 - 99 mg/dL  Glucose, capillary     Status: Abnormal   Collection Time: 03/04/17  7:32 AM  Result Value Ref Range   Glucose-Capillary 389 (H) 65 - 99 mg/dL  Glucose, capillary     Status: Abnormal   Collection Time: 03/04/17 11:50 AM  Result Value Ref Range   Glucose-Capillary 450 (H) 65 - 99 mg/dL  Glucose, capillary     Status: Abnormal   Collection Time: 03/04/17  1:43 PM  Result Value Ref Range   Glucose-Capillary 431 (H) 65 - 99 mg/dL  Glucose, capillary     Status: Abnormal   Collection Time: 03/04/17  5:29 PM  Result Value Ref Range   Glucose-Capillary  281 (H) 65 - 99 mg/dL  Glucose, capillary     Status: Abnormal   Collection Time: 03/04/17  9:37 PM  Result Value Ref Range   Glucose-Capillary 322 (H) 65 - 99 mg/dL  CBC     Status: Abnormal   Collection Time: 03/04/17 10:47 PM  Result Value Ref Range   WBC 12.5 (H) 4.0 - 10.5 K/uL   RBC 2.76 (L) 4.22 - 5.81 MIL/uL   Hemoglobin 8.1 (L) 13.0 - 17.0 g/dL   HCT 25.3 (L) 39.0 - 52.0 %   MCV 91.7 78.0 - 100.0 fL   MCH 29.3 26.0 - 34.0 pg   MCHC 32.0 30.0 - 36.0 g/dL   RDW 15.4 11.5 - 15.5 %   Platelets 287 150 - 400 K/uL  Basic metabolic panel     Status: Abnormal   Collection Time: 03/04/17 10:47 PM  Result Value Ref Range   Sodium 130 (L) 135 - 145 mmol/L   Potassium 2.5 (LL) 3.5 - 5.1 mmol/L    Comment: DELTA CHECK NOTED CRITICAL RESULT CALLED TO, READ BACK BY AND VERIFIED WITH: MINTZ A,RN 03/04/17 2347 WAYK    Chloride 97 (L) 101 - 111 mmol/L   CO2 25 22 - 32 mmol/L   Glucose, Bld 341 (H) 65 - 99 mg/dL   BUN 37 (H) 6 - 20 mg/dL   Creatinine, Ser 4.73 (H) 0.61 - 1.24 mg/dL   Calcium 7.3 (L) 8.9 - 10.3 mg/dL   GFR calc non Af Amer 11 (L) >60 mL/min   GFR calc Af Amer 13 (L) >60 mL/min    Comment: (NOTE) The eGFR has been calculated using the CKD EPI equation. This calculation has not been validated in all clinical situations. eGFR's persistently <60 mL/min signify possible Chronic Kidney Disease.    Anion gap 8 5 - 15  Lactic acid, plasma     Status: None  Collection Time: 03/05/17 12:45 AM  Result Value Ref Range   Lactic Acid, Venous 1.4 0.5 - 1.9 mmol/L  Troponin I     Status: Abnormal   Collection Time: 03/05/17 12:45 AM  Result Value Ref Range   Troponin I 0.17 (HH) <0.03 ng/mL    Comment: CRITICAL RESULT CALLED TO, READ BACK BY AND VERIFIED WITH: DAVIS C,RN 03/05/17 0153 WAYK   Procalcitonin - Baseline     Status: None   Collection Time: 03/05/17 12:45 AM  Result Value Ref Range   Procalcitonin 0.41 ng/mL    Comment:        Interpretation: PCT  (Procalcitonin) <= 0.5 ng/mL: Systemic infection (sepsis) is not likely. Local bacterial infection is possible. (NOTE)         ICU PCT Algorithm               Non ICU PCT Algorithm    ----------------------------     ------------------------------         PCT < 0.25 ng/mL                 PCT < 0.1 ng/mL     Stopping of antibiotics            Stopping of antibiotics       strongly encouraged.               strongly encouraged.    ----------------------------     ------------------------------       PCT level decrease by               PCT < 0.25 ng/mL       >= 80% from peak PCT       OR PCT 0.25 - 0.5 ng/mL          Stopping of antibiotics                                             encouraged.     Stopping of antibiotics           encouraged.    ----------------------------     ------------------------------       PCT level decrease by              PCT >= 0.25 ng/mL       < 80% from peak PCT        AND PCT >= 0.5 ng/mL            Continuin g antibiotics                                              encouraged.       Continuing antibiotics            encouraged.    ----------------------------     ------------------------------     PCT level increase compared          PCT > 0.5 ng/mL         with peak PCT AND          PCT >= 0.5 ng/mL             Escalation of antibiotics  strongly encouraged.      Escalation of antibiotics        strongly encouraged.     Dg Chest Port 1 View  Result Date: 03/05/2017 CLINICAL DATA:  Sepsis EXAM: PORTABLE CHEST 1 VIEW COMPARISON:  01/12/2017 FINDINGS: Postoperative changes in the mediastinum. Cardiac pacemaker with lead tip along the left chest, unchanged in position. Cardiac enlargement. No vascular congestion or edema. No blunting of costophrenic angles. No pneumothorax. No focal consolidation. Calcification of the aorta. IMPRESSION: Cardiac enlargement.  No evidence of active pulmonary disease.  Electronically Signed   By: Lucienne Capers M.D.   On: 03/05/2017 01:16    Review of Systems  Constitutional: Positive for chills and fever.  HENT: Negative.   Eyes: Negative.   Respiratory: Negative.   Cardiovascular: Negative.   Gastrointestinal: Negative.   Genitourinary: Negative.   Musculoskeletal: Negative.   Skin: Negative.   Neurological: Negative.   Endo/Heme/Allergies: Negative.   Psychiatric/Behavioral: Negative.    Blood pressure (!) 113/56, pulse (!) 102, temperature 100.2 F (37.9 C), temperature source Oral, resp. rate 20, height '5\' 9"'  (1.753 m), weight 98.6 kg (217 lb 6 oz), SpO2 97 %. Physical Exam  Constitutional: He is oriented to person, place, and time. He appears well-developed and well-nourished. No distress.  HENT:  Head: Normocephalic and atraumatic.  Eyes: Conjunctivae are normal. Right eye exhibits no discharge. Left eye exhibits no discharge.  Neck: Normal range of motion. Neck supple.  Cardiovascular: Normal rate and regular rhythm.  Respiratory: Effort normal and breath sounds normal.  GI: Soft. Bowel sounds are normal. He exhibits no distension. There is no tenderness.  Musculoskeletal: Normal range of motion.  Bilateral BKA.  Neurological: He is alert and oriented to person, place, and time.  Moves all extremities 5 x 5.  Skin: He is not diaphoretic.  Has wound VAC on both lower extremities.  Psychiatric: He has a normal mood and affect.    Assessment/Plan: #1.  SIRS -source not clear.  Could be either the BKA site so intraperitoneal.  Will need to get dialysate fluid for which I have advised the nurse to instruct the nephrology nurse.  Check chest x-ray.  At this time I have ordered blood cultures lactic acid levels and ordered Zosyn and Zyvox as patient is allergic to vancomycin.  For further dose of Zyvox we may have to consult infectious disease in a.m.  Follow cultures. #2.  Diabetes mellitus type 2 uncontrolled -patient is on NPH insulin  twice daily.  Closely follow CBGs with sliding scale coverage. #3.  ESRD on peritoneal dialysis per nephrology. #4.  Bilateral BKA.  Per Dr. Sharol Given. #5.  History of CAD -denies any chest pain. #6.  Sleep apnea on CPAP.  Thanks for involving [place] in patient's care we will follow along with you.  Rise Patience 03/05/2017, 2:44 AM

## 2017-03-05 NOTE — Progress Notes (Signed)
PD catheter dressing changed.  Site intact with no S/S infection.  Gentamicin cream applied.  Tx initiated at 1830 w/o issue.  1.5% solution used per Nephrology note.  Will continue to monitor.

## 2017-03-05 NOTE — Progress Notes (Signed)
Patient ID: Joshua [name], male   DOB: 08-Apr-1941, 76 y.o.   MRN: [id number] The wound VACs were removed from both transtibial amputations.  The wound edges are well approximated there is no cellulitis no purulence no signs of infection no signs of dehiscence.

## 2017-03-05 NOTE — Progress Notes (Signed)
Pharmacy Antibiotic Note  Joshua [name] is a 76 y.o. male admitted on 03/01/2017 with foot wound.  Pharmacy has been consulted for Zosyn dosing. Had been on po cipro for the foot wound. Now switching to broad spectrum anti-biotics for rule out sepsis. WBC 12.5. Tmax 100.2. Pt has ERSD on peritoneal dialysis.   Plan: Zyvox per MD-will need ID/CCM approval for continued use Zosyn 2.25g IV q8h Trend WBC, temp F/U infectious work-up  Height: 5\' 9"  (175.3 cm) Weight: 217 lb 6 oz (98.6 kg) IBW/kg (Calculated) : 70.7  Temp (24hrs), Avg:99.6 F (37.6 C), Min:99.1 F (37.3 C), Max:100.2 F (37.9 C)  Recent Labs  Lab 03/01/17 1006 03/03/17 0654 03/04/17 2247  WBC 10.8* 10.6* 12.5*  CREATININE 4.88* 5.55* 4.73*    Estimated Creatinine Clearance: 15.4 mL/min (A) (by C-G formula based on SCr of 4.73 mg/dL (H)).    Allergies  Allergen Reactions  . Gabapentin (Once-Daily) Hypertension  . Lisinopril Hives  . Methotrexate Derivatives Other (See Comments)    blisters  . Sulfa Antibiotics Hives  . Tape Hives  . Vancomycin Other (See Comments)    Blisters   . Zanaflex [Tizanidine Hcl]     Hypotensive stroke   [name], [name] 03/05/2017 12:57 AM

## 2017-03-05 NOTE — Significant Event (Signed)
Rapid Response Event Note RN called for new onset confusion Overview: Time Called: 2228 Arrival Time: 2230 Event Type: Neurologic  Initial Focused Assessment: On arrival pt lying supine in bed, skin warm and clammy, oriented to self and time, disoriented to place, pt states "I've been itching since the train ride." pt lethargic, easy to arouse but quickly drifts off to sleep. BP 84/66, HR 105, RR 20, Temp 100.2. [name] called and is going to consult with [facility] for further evaluation of possible sepsis.  BP 113/59, HR 102, RR 20, 97% CPAP  Interventions: NS Bolus 250 cc BMP, CBC, BC x2 Plan of Care (if not transferred): Continue to monitor pt, RN to call for any concerns Event Summary: Name of Physician Notified: [name] at 2233    at    Outcome: Stayed in room and stabalized     [name], [name]

## 2017-03-06 ENCOUNTER — Inpatient Hospital Stay (HOSPITAL_COMMUNITY)
Admission: RE | Admit: 2017-03-06 | Discharge: 2017-03-12 | DRG: 559 | Disposition: A | Discharge status: Other Acute Inpatient Hospital | Payer: Medicare Other | Source: Intra-hospital | Attending: Physical Medicine & Rehabilitation | Admitting: Physical Medicine & Rehabilitation

## 2017-03-06 ENCOUNTER — Encounter (HOSPITAL_COMMUNITY): Payer: Self-pay | Admitting: *Deleted

## 2017-03-06 ENCOUNTER — Inpatient Hospital Stay (HOSPITAL_COMMUNITY)
Admission: RE | Admit: 2017-03-06 | Payer: Medicare Other | Source: Intra-hospital | Admitting: Physical Medicine & Rehabilitation

## 2017-03-06 DIAGNOSIS — D631 Anemia in chronic kidney disease: Secondary | ICD-10-CM | POA: Diagnosis present

## 2017-03-06 DIAGNOSIS — J449 Chronic obstructive pulmonary disease, unspecified: Secondary | ICD-10-CM | POA: Diagnosis present

## 2017-03-06 DIAGNOSIS — Z794 Long term (current) use of insulin: Secondary | ICD-10-CM | POA: Diagnosis not present

## 2017-03-06 DIAGNOSIS — I248 Other forms of acute ischemic heart disease: Secondary | ICD-10-CM | POA: Diagnosis present

## 2017-03-06 DIAGNOSIS — E43 Unspecified severe protein-calorie malnutrition: Secondary | ICD-10-CM | POA: Diagnosis present

## 2017-03-06 DIAGNOSIS — I10 Essential (primary) hypertension: Secondary | ICD-10-CM | POA: Diagnosis not present

## 2017-03-06 DIAGNOSIS — E1142 Type 2 diabetes mellitus with diabetic polyneuropathy: Secondary | ICD-10-CM | POA: Diagnosis not present

## 2017-03-06 DIAGNOSIS — R6510 Systemic inflammatory response syndrome (SIRS) of non-infectious origin without acute organ dysfunction: Secondary | ICD-10-CM

## 2017-03-06 DIAGNOSIS — E876 Hypokalemia: Secondary | ICD-10-CM | POA: Diagnosis not present

## 2017-03-06 DIAGNOSIS — I700 Atherosclerosis of aorta: Secondary | ICD-10-CM | POA: Diagnosis not present

## 2017-03-06 DIAGNOSIS — R7309 Other abnormal glucose: Secondary | ICD-10-CM | POA: Diagnosis not present

## 2017-03-06 DIAGNOSIS — D62 Acute posthemorrhagic anemia: Secondary | ICD-10-CM | POA: Diagnosis not present

## 2017-03-06 DIAGNOSIS — E11649 Type 2 diabetes mellitus with hypoglycemia without coma: Secondary | ICD-10-CM | POA: Diagnosis not present

## 2017-03-06 DIAGNOSIS — N186 End stage renal disease: Secondary | ICD-10-CM

## 2017-03-06 DIAGNOSIS — I2510 Atherosclerotic heart disease of native coronary artery without angina pectoris: Secondary | ICD-10-CM | POA: Diagnosis not present

## 2017-03-06 DIAGNOSIS — Z87891 Personal history of nicotine dependence: Secondary | ICD-10-CM

## 2017-03-06 DIAGNOSIS — I5041 Acute combined systolic (congestive) and diastolic (congestive) heart failure: Secondary | ICD-10-CM | POA: Diagnosis not present

## 2017-03-06 DIAGNOSIS — Z888 Allergy status to other drugs, medicaments and biological substances status: Secondary | ICD-10-CM

## 2017-03-06 DIAGNOSIS — E162 Hypoglycemia, unspecified: Secondary | ICD-10-CM | POA: Diagnosis not present

## 2017-03-06 DIAGNOSIS — I132 Hypertensive heart and chronic kidney disease with heart failure and with stage 5 chronic kidney disease, or end stage renal disease: Secondary | ICD-10-CM | POA: Diagnosis present

## 2017-03-06 DIAGNOSIS — Z89512 Acquired absence of left leg below knee: Secondary | ICD-10-CM | POA: Diagnosis not present

## 2017-03-06 DIAGNOSIS — I953 Hypotension of hemodialysis: Secondary | ICD-10-CM | POA: Diagnosis not present

## 2017-03-06 DIAGNOSIS — R0602 Shortness of breath: Secondary | ICD-10-CM

## 2017-03-06 DIAGNOSIS — Z79899 Other long term (current) drug therapy: Secondary | ICD-10-CM

## 2017-03-06 DIAGNOSIS — Z951 Presence of aortocoronary bypass graft: Secondary | ICD-10-CM

## 2017-03-06 DIAGNOSIS — I429 Cardiomyopathy, unspecified: Secondary | ICD-10-CM | POA: Diagnosis present

## 2017-03-06 DIAGNOSIS — E1151 Type 2 diabetes mellitus with diabetic peripheral angiopathy without gangrene: Secondary | ICD-10-CM | POA: Diagnosis present

## 2017-03-06 DIAGNOSIS — R195 Other fecal abnormalities: Secondary | ICD-10-CM

## 2017-03-06 DIAGNOSIS — M3500 Sicca syndrome, unspecified: Secondary | ICD-10-CM | POA: Diagnosis present

## 2017-03-06 DIAGNOSIS — E785 Hyperlipidemia, unspecified: Secondary | ICD-10-CM | POA: Diagnosis not present

## 2017-03-06 DIAGNOSIS — D72829 Elevated white blood cell count, unspecified: Secondary | ICD-10-CM | POA: Diagnosis not present

## 2017-03-06 DIAGNOSIS — E46 Unspecified protein-calorie malnutrition: Secondary | ICD-10-CM | POA: Diagnosis present

## 2017-03-06 DIAGNOSIS — E7219 Other disorders of sulfur-bearing amino-acid metabolism: Secondary | ICD-10-CM | POA: Diagnosis not present

## 2017-03-06 DIAGNOSIS — J9601 Acute respiratory failure with hypoxia: Secondary | ICD-10-CM | POA: Diagnosis not present

## 2017-03-06 DIAGNOSIS — I951 Orthostatic hypotension: Secondary | ICD-10-CM | POA: Diagnosis not present

## 2017-03-06 DIAGNOSIS — Z881 Allergy status to other antibiotic agents status: Secondary | ICD-10-CM

## 2017-03-06 DIAGNOSIS — I471 Supraventricular tachycardia: Secondary | ICD-10-CM | POA: Diagnosis present

## 2017-03-06 DIAGNOSIS — Z91048 Other nonmedicinal substance allergy status: Secondary | ICD-10-CM

## 2017-03-06 DIAGNOSIS — R0989 Other specified symptoms and signs involving the circulatory and respiratory systems: Secondary | ICD-10-CM | POA: Diagnosis not present

## 2017-03-06 DIAGNOSIS — I255 Ischemic cardiomyopathy: Secondary | ICD-10-CM | POA: Diagnosis not present

## 2017-03-06 DIAGNOSIS — E1122 Type 2 diabetes mellitus with diabetic chronic kidney disease: Secondary | ICD-10-CM | POA: Diagnosis not present

## 2017-03-06 DIAGNOSIS — Z4781 Encounter for orthopedic aftercare following surgical amputation: Principal | ICD-10-CM

## 2017-03-06 DIAGNOSIS — R000 Tachycardia, unspecified: Secondary | ICD-10-CM

## 2017-03-06 DIAGNOSIS — I4891 Unspecified atrial fibrillation: Secondary | ICD-10-CM | POA: Diagnosis present

## 2017-03-06 DIAGNOSIS — Z8673 Personal history of transient ischemic attack (TIA), and cerebral infarction without residual deficits: Secondary | ICD-10-CM

## 2017-03-06 DIAGNOSIS — I5032 Chronic diastolic (congestive) heart failure: Secondary | ICD-10-CM | POA: Diagnosis present

## 2017-03-06 DIAGNOSIS — Z833 Family history of diabetes mellitus: Secondary | ICD-10-CM

## 2017-03-06 DIAGNOSIS — G9340 Encephalopathy, unspecified: Secondary | ICD-10-CM | POA: Diagnosis not present

## 2017-03-06 DIAGNOSIS — Z89511 Acquired absence of right leg below knee: Secondary | ICD-10-CM

## 2017-03-06 DIAGNOSIS — E1165 Type 2 diabetes mellitus with hyperglycemia: Secondary | ICD-10-CM

## 2017-03-06 DIAGNOSIS — K5901 Slow transit constipation: Secondary | ICD-10-CM | POA: Diagnosis not present

## 2017-03-06 DIAGNOSIS — Z95810 Presence of automatic (implantable) cardiac defibrillator: Secondary | ICD-10-CM

## 2017-03-06 DIAGNOSIS — Z7902 Long term (current) use of antithrombotics/antiplatelets: Secondary | ICD-10-CM | POA: Diagnosis not present

## 2017-03-06 DIAGNOSIS — K5900 Constipation, unspecified: Secondary | ICD-10-CM | POA: Diagnosis present

## 2017-03-06 DIAGNOSIS — Z992 Dependence on renal dialysis: Secondary | ICD-10-CM | POA: Diagnosis not present

## 2017-03-06 DIAGNOSIS — D638 Anemia in other chronic diseases classified elsewhere: Secondary | ICD-10-CM

## 2017-03-06 DIAGNOSIS — K219 Gastro-esophageal reflux disease without esophagitis: Secondary | ICD-10-CM | POA: Diagnosis present

## 2017-03-06 DIAGNOSIS — R531 Weakness: Secondary | ICD-10-CM | POA: Diagnosis present

## 2017-03-06 DIAGNOSIS — K5903 Drug induced constipation: Secondary | ICD-10-CM

## 2017-03-06 DIAGNOSIS — Z7982 Long term (current) use of aspirin: Secondary | ICD-10-CM

## 2017-03-06 DIAGNOSIS — I509 Heart failure, unspecified: Secondary | ICD-10-CM | POA: Diagnosis not present

## 2017-03-06 DIAGNOSIS — Z6824 Body mass index (BMI) 24.0-24.9, adult: Secondary | ICD-10-CM

## 2017-03-06 DIAGNOSIS — M791 Myalgia, unspecified site: Secondary | ICD-10-CM | POA: Diagnosis present

## 2017-03-06 DIAGNOSIS — G4730 Sleep apnea, unspecified: Secondary | ICD-10-CM | POA: Diagnosis present

## 2017-03-06 DIAGNOSIS — R410 Disorientation, unspecified: Secondary | ICD-10-CM | POA: Diagnosis present

## 2017-03-06 DIAGNOSIS — N2581 Secondary hyperparathyroidism of renal origin: Secondary | ICD-10-CM | POA: Diagnosis present

## 2017-03-06 DIAGNOSIS — Z882 Allergy status to sulfonamides status: Secondary | ICD-10-CM

## 2017-03-06 DIAGNOSIS — I498 Other specified cardiac arrhythmias: Secondary | ICD-10-CM | POA: Diagnosis not present

## 2017-03-06 LAB — GLUCOSE, CAPILLARY
GLUCOSE-CAPILLARY: 423 mg/dL — AB (ref 65–99)
GLUCOSE-CAPILLARY: 295 mg/dL — AB (ref 65–99)
Glucose-Capillary: 358 mg/dL — ABNORMAL HIGH (ref 65–99)
Glucose-Capillary: 297 mg/dL — ABNORMAL HIGH (ref 65–99)

## 2017-03-06 LAB — CBC
HCT: 25.6 % — ABNORMAL LOW (ref 39.0–52.0)
Hemoglobin: 8.1 g/dL — ABNORMAL LOW (ref 13.0–17.0)
MCH: 30 pg (ref 26.0–34.0)
MCHC: 31.6 g/dL (ref 30.0–36.0)
MCV: 94.8 fL (ref 78.0–100.0)
PLATELETS: 300 10*3/uL (ref 150–400)
RBC: 2.7 MIL/uL — AB (ref 4.22–5.81)
RDW: 15.7 % — ABNORMAL HIGH (ref 11.5–15.5)
WBC: 12.5 10*3/uL — AB (ref 4.0–10.5)

## 2017-03-06 LAB — POTASSIUM: POTASSIUM: 3.8 mmol/L (ref 3.5–5.1)

## 2017-03-06 MED ORDER — SORBITOL 70 % SOLN: 30.0000 mL | Freq: Four times a day (QID) | Status: DC | PRN

## 2017-03-06 MED ORDER — DELFLEX-LC/2.5% DEXTROSE 394 MOSM/L IP SOLN: INTRAPERITONEAL | Status: DC

## 2017-03-06 MED ORDER — DOCUSATE SODIUM 100 MG PO CAPS
100.0000 mg | ORAL_CAPSULE | Freq: Two times a day (BID) | ORAL | Status: DC
  Administered 2017-03-06 – 2017-03-12 (×11): 100 mg via ORAL
  Filled 2017-03-06 (×12): qty 1

## 2017-03-06 MED ORDER — POLYETHYLENE GLYCOL 3350 17 G PO PACK
17.0000 g | PACK | Freq: Every day | ORAL | Status: DC
  Administered 2017-03-12: 17 g via ORAL
  Filled 2017-03-06 (×5): qty 1

## 2017-03-06 MED ORDER — PIPERACILLIN-TAZOBACTAM IN DEX 2-0.25 GM/50ML IV SOLN
2.2500 g | Freq: Three times a day (TID) | INTRAVENOUS | Status: DC
  Administered 2017-03-06 – 2017-03-07 (×2): 2.25 g via INTRAVENOUS
  Filled 2017-03-06 (×3): qty 50

## 2017-03-06 MED ORDER — OXYCODONE HCL 5 MG PO TABS
10.0000 mg | ORAL_TABLET | ORAL | Status: DC | PRN
  Administered 2017-03-07: 10 mg via ORAL
  Filled 2017-03-06 (×2): qty 2

## 2017-03-06 MED ORDER — ACETAMINOPHEN 325 MG PO TABS
650.0000 mg | ORAL_TABLET | ORAL | Status: DC | PRN
  Administered 2017-03-08: 650 mg via ORAL
  Filled 2017-03-06: qty 2

## 2017-03-06 MED ORDER — NEPRO/CARBSTEADY PO LIQD
237.0000 mL | Freq: Two times a day (BID) | ORAL | Status: DC
  Administered 2017-03-07 – 2017-03-12 (×6): 237 mL via ORAL

## 2017-03-06 MED ORDER — RENA-VITE PO TABS
1.0000 | ORAL_TABLET | Freq: Every day | ORAL | Status: DC
  Administered 2017-03-06 – 2017-03-11 (×6): 1 via ORAL
  Filled 2017-03-06 (×6): qty 1

## 2017-03-06 MED ORDER — POLYETHYLENE GLYCOL 3350 17 G PO PACK: 17.0000 g | PACK | Freq: Every day | ORAL | Status: DC | PRN

## 2017-03-06 MED ORDER — INSULIN ASPART 100 UNIT/ML ~~LOC~~ SOLN
0.0000 [IU] | Freq: Three times a day (TID) | SUBCUTANEOUS | Status: DC
  Administered 2017-03-07: 2 [IU] via SUBCUTANEOUS
  Administered 2017-03-07: 7 [IU] via SUBCUTANEOUS
  Administered 2017-03-07: 9 [IU] via SUBCUTANEOUS
  Administered 2017-03-08: 1 [IU] via SUBCUTANEOUS
  Administered 2017-03-08: 9 [IU] via SUBCUTANEOUS
  Administered 2017-03-08: 3 [IU] via SUBCUTANEOUS
  Administered 2017-03-09: 6 [IU] via SUBCUTANEOUS
  Administered 2017-03-09: 9 [IU] via SUBCUTANEOUS
  Administered 2017-03-09 – 2017-03-10 (×2): 7 [IU] via SUBCUTANEOUS
  Administered 2017-03-10: 3 [IU] via SUBCUTANEOUS
  Administered 2017-03-11: 7 [IU] via SUBCUTANEOUS
  Administered 2017-03-11: 3 [IU] via SUBCUTANEOUS

## 2017-03-06 MED ORDER — NITROGLYCERIN 0.4 MG SL SUBL: 0.4000 mg | SUBLINGUAL_TABLET | SUBLINGUAL | Status: DC | PRN

## 2017-03-06 MED ORDER — ACETAMINOPHEN 650 MG RE SUPP: 650.0000 mg | RECTAL | Status: DC | PRN

## 2017-03-06 MED ORDER — ATORVASTATIN CALCIUM 80 MG PO TABS
80.0000 mg | ORAL_TABLET | Freq: Every day | ORAL | Status: DC
  Administered 2017-03-07 – 2017-03-11 (×5): 80 mg via ORAL
  Filled 2017-03-06 (×5): qty 1

## 2017-03-06 MED ORDER — HEPARIN 1000 UNIT/ML FOR PERITONEAL DIALYSIS
INTRAPERITONEAL | Status: DC | PRN
  Filled 2017-03-06: qty 5000

## 2017-03-06 MED ORDER — CARVEDILOL 6.25 MG PO TABS
6.2500 mg | ORAL_TABLET | Freq: Every day | ORAL | Status: DC
  Administered 2017-03-06 – 2017-03-11 (×6): 6.25 mg via ORAL
  Filled 2017-03-06 (×6): qty 1

## 2017-03-06 MED ORDER — PRO-STAT SUGAR FREE PO LIQD
30.0000 mL | Freq: Two times a day (BID) | ORAL | Status: DC
  Administered 2017-03-06 – 2017-03-07 (×2): 30 mL via ORAL
  Administered 2017-03-07: 21:00:00 via ORAL
  Administered 2017-03-08 – 2017-03-12 (×9): 30 mL via ORAL
  Filled 2017-03-06 (×12): qty 30

## 2017-03-06 MED ORDER — DARBEPOETIN ALFA 150 MCG/0.3ML IJ SOSY
150.0000 ug | PREFILLED_SYRINGE | INTRAMUSCULAR | Status: DC
  Filled 2017-03-06: qty 0.3

## 2017-03-06 MED ORDER — INSULIN ASPART 100 UNIT/ML ~~LOC~~ SOLN
3.0000 [IU] | Freq: Three times a day (TID) | SUBCUTANEOUS | Status: DC
  Administered 2017-03-07 – 2017-03-11 (×12): 3 [IU] via SUBCUTANEOUS

## 2017-03-06 MED ORDER — DELFLEX-LC/1.5% DEXTROSE 344 MOSM/L IP SOLN: INTRAPERITONEAL | Status: DC

## 2017-03-06 MED ORDER — CLOPIDOGREL BISULFATE 75 MG PO TABS
75.0000 mg | ORAL_TABLET | Freq: Every day | ORAL | Status: DC
  Administered 2017-03-07 – 2017-03-12 (×6): 75 mg via ORAL
  Filled 2017-03-06 (×6): qty 1

## 2017-03-06 MED ORDER — HEPARIN 1000 UNIT/ML FOR PERITONEAL DIALYSIS: 2500.0000 [IU] | INTRAMUSCULAR | Status: DC | PRN

## 2017-03-06 MED ORDER — ASPIRIN EC 81 MG PO TBEC
81.0000 mg | DELAYED_RELEASE_TABLET | Freq: Every day | ORAL | Status: DC
  Administered 2017-03-07 – 2017-03-12 (×6): 81 mg via ORAL
  Filled 2017-03-06 (×6): qty 1

## 2017-03-06 MED ORDER — INSULIN ASPART 100 UNIT/ML ~~LOC~~ SOLN
1.0000 [IU] | Freq: Once | SUBCUTANEOUS | Status: AC
  Administered 2017-03-06: 1 [IU] via SUBCUTANEOUS

## 2017-03-06 MED ORDER — POLYETHYLENE GLYCOL 3350 17 G PO PACK: 17.0000 g | PACK | Freq: Every morning | ORAL | Status: DC

## 2017-03-06 MED ORDER — HEPARIN 1000 UNIT/ML FOR PERITONEAL DIALYSIS
INTRAPERITONEAL | Status: DC | PRN
  Filled 2017-03-06: qty 3000

## 2017-03-06 MED ORDER — BISACODYL 10 MG RE SUPP: 10.0000 mg | Freq: Every day | RECTAL | Status: DC | PRN

## 2017-03-06 MED ORDER — INSULIN GLARGINE 100 UNIT/ML ~~LOC~~ SOLN
10.0000 [IU] | Freq: Every day | SUBCUTANEOUS | Status: DC
  Administered 2017-03-06 – 2017-03-07 (×2): 10 [IU] via SUBCUTANEOUS
  Filled 2017-03-06 (×2): qty 0.1

## 2017-03-06 MED ORDER — LINEZOLID 600 MG/300ML IV SOLN
600.0000 mg | Freq: Two times a day (BID) | INTRAVENOUS | Status: DC
  Administered 2017-03-06 – 2017-03-07 (×2): 600 mg via INTRAVENOUS
  Filled 2017-03-06 (×4): qty 300

## 2017-03-06 NOTE — Progress Notes (Signed)
Pt admitted to 4M02 with wife at bedside. Pt alert and in no pain. Pt oriented to unit prior to the beginning of his PD session. Continue plan of care.

## 2017-03-06 NOTE — Progress Notes (Signed)
Rehab admissions - I met with patient and his wife.  They would like inpatient rehab admission.  Patient well known to me from previous inpatient rehab admissions.  Bed available and will admit to acute inpatient rehab today.  Call me for questions.  #[phone]

## 2017-03-06 NOTE — Progress Notes (Signed)
RT NOTE:  Pt has a home CPAP that his manages @ bedtime. RT available if needed.

## 2017-03-06 NOTE — Progress Notes (Signed)
PROGRESS NOTE    [name]  ZOX:[id number] DOB: 1940-10-17 DOA: 03/01/2017 PCP: Dorice [name], MD    Brief Narrative:  76 y.o. male.  HPI: History of ESRD on peritoneal dialysis, CAD status post CABG, diabetes mellitus type 2, AICD placement who had bilateral BKA 3 days ago for wound dehiscence and nonhealing ulcers was found to be hypotensive confused and febrile.  We were consulted for further evaluation recommendations  Assessment & Plan:   Principal Problem:   SIRS (systemic inflammatory response syndrome) (HCC) - Pt is fever free since starting antibiotics  - However its possible patient may have viral infection. There is no source of infection identified currently. - Currently we are covering with antibiotics broad-spectrum. May want to de-escalate and observe off of antibiotics. Will depend on hospital course.   Active Problems:   ESRD on peritoneal dialysis [facility]) - Nephrology to continue managing    Uncontrolled type 2 diabetes mellitus with hyperglycemia, with long-term current use of insulin (HCC)  - continue patient's Lantus and short-acting with NovoLog - Diabetic diet    S/P bilateral BKA (below knee amputation) (HCC) - Management per primary    Constipation due to pain medication   Coronary artery disease involving native coronary artery of native heart without angina pectoris   Hyperlipidemia -  stable on statin    S/P unilateral BKA (below knee amputation), right (HCC)   constipation -We'll add enema to help patient have a BM  DVT prophylaxis: Per primary Code Status: Full Family Communication: d/c patient and family member at bedside Disposition Plan: with resolution of fevers.    Procedures: BL BKA's  Antimicrobials:   Pt is on zyvox and zosyn   Subjective: Pt complaining of constipation and is requesting medication for this. Some abdominal discomfort.  objective Vitals:   03/05/17 1645 03/05/17 2017 03/06/17 0458 03/06/17  0830  BP: (!) 128/54 129/67 130/66 (!) 121/49  Pulse: 100 (!) 107 95 87  Resp: 18 19 18 18   Temp: 98.5 F (36.9 C) 98.2 F (36.8 C) 98.5 F (36.9 C) 98.1 F (36.7 C)  TempSrc: Oral Oral Oral Oral  SpO2: 100% 100% 99%   Weight:  97.4 kg (214 lb 11.7 oz)    Height:        Intake/Output Summary (Last 24 hours) at 03/06/2017 1546 Last data filed at 03/06/2017 0830 Gross per 24 hour  Intake 81191 ml  Output 13167 ml  Net 129 ml   Filed Weights   03/04/17 2142 03/05/17 0839 03/05/17 2017  Weight: 98.6 kg (217 lb 6 oz) 97.6 kg (215 lb 2.7 oz) 97.4 kg (214 lb 11.7 oz)    Examination:  General exam: Appears calm and comfortable, in nad. Respiratory system: Clear to auscultation. Respiratory effort normal. Equal chest rise. Cardiovascular system: S1 & S2 heard, RRR. No JVD, murmurs, rubs, gallops or clicks.  Gastrointestinal system: Abdomen is nondistended, soft and nontender. No organomegaly or masses felt. Normal bowel sounds heard. Central nervous system: Alert and oriented. No focal neurological deficits. Extremities: Pt has BL BKA's Skin: No rashes, lesions or ulcers, on limited exam. Psychiatry:  Mood & affect appropriate.   Data Reviewed: I have personally reviewed following labs and imaging studies  CBC: Recent Labs  Lab 03/01/17 1006 03/03/17 0654 03/04/17 2247 03/06/17 0205  WBC 10.8* 10.6* 12.5* 12.5*  HGB 11.1* 9.0* 8.1* 8.1*  HCT 34.4* 28.4* 25.3* 25.6*  MCV 93.5 95.0 91.7 94.8  PLT 367 313 287 300   Basic  Metabolic Panel: Recent Labs  Lab 03/01/17 1006 03/03/17 0654 03/04/17 2247 03/05/17 1407 03/06/17 0205  NA 135 132* 130*  --   --   K 3.7 3.5 2.5*  --  3.8  CL 99* 93* 97*  --   --   CO2 25 29 25   --   --   GLUCOSE 196* 350* 341*  --   --   BUN 28* 33* 37*  --   --   CREATININE 4.88* 5.55* 4.73*  --   --   CALCIUM 9.2 8.4* 7.3*  --   --   MG  --   --   --  1.6*  --   PHOS  --  3.6  --   --   --    GFR: Estimated Creatinine Clearance: 15.3  mL/min (A) (by C-G formula based on SCr of 4.73 mg/dL (H)). Liver Function Tests: Recent Labs  Lab 03/01/17 1006 03/03/17 0654  AST 38  --   ALT 18  --   ALKPHOS 86  --   BILITOT 0.9  --   PROT 5.7*  --   ALBUMIN 1.8* 1.6*   No results for input(s): LIPASE, AMYLASE in the last 168 hours. No results for input(s): AMMONIA in the last 168 hours. Coagulation Profile: Recent Labs  Lab 03/01/17 1006  INR 1.07   Cardiac Enzymes: Recent Labs  Lab 03/05/17 0045 03/05/17 0953  TROPONINI 0.17* 0.15*   BNP (last 3 results) No results for input(s): PROBNP in the last 8760 hours. HbA1C: No results for input(s): HGBA1C in the last 72 hours. CBG: Recent Labs  Lab 03/05/17 1146 03/05/17 1710 03/05/17 2015 03/06/17 0738 03/06/17 1226  GLUCAP 337* 230* 329* 358* 423*   Lipid Profile: No results for input(s): CHOL, HDL, LDLCALC, TRIG, CHOLHDL, LDLDIRECT in the last 72 hours. Thyroid Function Tests: No results for input(s): TSH, T4TOTAL, FREET4, T3FREE, THYROIDAB in the last 72 hours. Anemia Panel: No results for input(s): VITAMINB12, FOLATE, FERRITIN, TIBC, IRON, RETICCTPCT in the last 72 hours. Sepsis Labs: Recent Labs  Lab 03/05/17 0045 03/05/17 0356  PROCALCITON 0.41  --   LATICACIDVEN 1.4 1.9    Recent Results (from the past 240 hour(s))  Culture, blood (routine x 2)     Status: None (Preliminary result)   Collection Time: 03/04/17 10:50 PM  Result Value Ref Range Status   Specimen Description BLOOD RIGHT ANTECUBITAL  Final   Special Requests   Final    BOTTLES DRAWN AEROBIC ONLY Blood Culture adequate volume   Culture NO GROWTH < 24 HOURS  Final   Report Status PENDING  Incomplete  Culture, blood (routine x 2)     Status: None (Preliminary result)   Collection Time: 03/04/17 11:06 PM  Result Value Ref Range Status   Specimen Description BLOOD RIGHT HAND  Final   Special Requests   Final    BOTTLES DRAWN AEROBIC ONLY Blood Culture adequate volume   Culture NO  GROWTH < 24 HOURS  Final   Report Status PENDING  Incomplete    Radiology Studies: Dg Chest Port 1 View  Result Date: 03/05/2017 CLINICAL DATA:  Sepsis EXAM: PORTABLE CHEST 1 VIEW COMPARISON:  01/12/2017 FINDINGS: Postoperative changes in the mediastinum. Cardiac pacemaker with lead tip along the left chest, unchanged in position. Cardiac enlargement. No vascular congestion or edema. No blunting of costophrenic angles. No pneumothorax. No focal consolidation. Calcification of the aorta. IMPRESSION: Cardiac enlargement.  No evidence of active pulmonary disease. Electronically Signed  By: Burman NievesWilliam  Stevens M.D.   On: 03/05/2017 01:16    Scheduled Meds: . aspirin EC  81 mg Oral Daily  . atorvastatin  80 mg Oral q1800  . carvedilol  6.25 mg Oral QHS  . clopidogrel  75 mg Oral Daily  . darbepoetin (ARANESP) injection - NON-DIALYSIS  150 mcg Subcutaneous Q Fri-1800  . docusate sodium  100 mg Oral BID  . feeding supplement (NEPRO CARB STEADY)  237 mL Oral BID BM  . feeding supplement (PRO-STAT SUGAR FREE 64)  30 mL Oral BID  . gentamicin cream  1 application Topical Daily  . insulin aspart  0-9 Units Subcutaneous TID WC  . insulin aspart  1 Units Subcutaneous Once  . insulin aspart  3 Units Subcutaneous TID WC  . insulin glargine  10 Units Subcutaneous QHS  . multivitamin  1 tablet Oral QHS  . polyethylene glycol  17 g Oral q morning - 10a   Continuous Infusions: . sodium chloride 10 mL/hr at 03/01/17 1554  . dialysis solution 1.5% low-MG/low-CA    . dialysis solution 1.5% low-MG/low-CA    . dialysis solution 2.5% low-MG/low-CA    . linezolid (ZYVOX) IV 600 mg (03/06/17 0915)  . piperacillin-tazobactam (ZOSYN)  IV 2.25 g (03/06/17 1510)     LOS: 5 days    Time spent: > 35 minutes  Penny PiaVEGA, Larah Kuntzman, MD [facility] Pager [phone]  If 7PM-7AM, please contact night-coverage [email or web address] Password [email or web address] 03/06/2017, 3:46 PM

## 2017-03-06 NOTE — Care Management Important Message (Signed)
Important Message  Patient Details  Name: [name] KitchenJames D Pudwill MRN: [id number] Date of Birth: 1940-10-08   Medicare Important Message Given:  Yes    Tatem Holsonback Abena 03/06/2017, 10:08 AM

## 2017-03-06 NOTE — Progress Notes (Signed)
Patient ID: Joshua KitchenJames D Luck, male   DOB: Apr 07, 1941, 76 y.o.   MRN: [id number] Alert this morning without any complaints.  He states the pain in both legs have diminished significantly.  Examination the dressings are clean and dry.  Anticipate discharge to skilled nursing.

## 2017-03-06 NOTE — Discharge Summary (Signed)
Discharge Diagnoses:  Principal Problem:   SIRS (systemic inflammatory response syndrome) (HCC) Active Problems:   ESRD on peritoneal dialysis (HCC)   Uncontrolled type 2 diabetes mellitus with hyperglycemia, with long-term current use of insulin (HCC)   S/P bilateral BKA (below knee amputation) (HCC)   Surgeries: Procedure(s): REVISION LEFT BELOW KNEE AMPUTATION RIGHT BELOW KNEE AMPUTATION on 03/01/2017    Consultants: Treatment Team:  [name], Kellie, MD Simon [facility], Mc3, MD Penny PiaVega, Orlando, MD Delano MetzSchertz, Robert, MD  Discharged Condition: Improved  Hospital Course: [name] is an 76 y.o. male who was admitted 03/01/2017 with a chief complaint of dehiscence left transtibial amputation with gangrene right foot, with a final diagnosis of Gangrene Left Below Knee Amputation, Dehiscence Right Foot Wound.  Patient was brought to the operating room on 03/01/2017 and underwent Procedure(s): REVISION LEFT BELOW KNEE AMPUTATION RIGHT BELOW KNEE AMPUTATION.    Patient was given perioperative antibiotics:  Anti-infectives (From admission, onward)   Start     Dose/Rate Route Frequency Ordered Stop   03/05/17 0100  linezolid (ZYVOX) IVPB 600 mg     600 mg 300 mL/hr over 60 Minutes Intravenous Every 12 hours 03/05/17 0054     03/05/17 0100  piperacillin-tazobactam (ZOSYN) IVPB 2.25 g     2.25 g 100 mL/hr over 30 Minutes Intravenous Every 8 hours 03/05/17 0056     03/03/17 1130  ciprofloxacin (CIPRO) tablet 500 mg  Status:  Discontinued     500 mg Oral Daily 03/03/17 1118 03/05/17 0040   03/01/17 2000  ceFAZolin (ANCEF) IVPB 1 g/50 mL premix     1 g 100 mL/hr over 30 Minutes Intravenous  Once 03/01/17 1457 03/01/17 2042   03/01/17 1200  ceFAZolin (ANCEF) IVPB 2g/100 mL premix     2 g 200 mL/hr over 30 Minutes Intravenous On call to O.R. 03/01/17 [id number] 03/01/17 1220    .  Patient was given sequential compression devices, early ambulation, and aspirin for DVT prophylaxis.  Recent  vital signs:  Patient Vitals for the past 24 hrs:  BP Temp Temp src Pulse Resp SpO2 Weight  03/06/17 0830 (!) 121/49 98.1 F (36.7 C) Oral 87 18 - -  03/06/17 0458 130/66 98.5 F (36.9 C) Oral 95 18 99 % -  03/05/17 2017 129/67 98.2 F (36.8 C) Oral (!) 107 19 100 % 214 lb 11.7 oz (97.4 kg)  03/05/17 1645 (!) 128/54 98.5 F (36.9 C) Oral 100 18 100 % -  .  Recent laboratory studies: Dg Chest Port 1 View  Result Date: 03/05/2017 CLINICAL DATA:  Sepsis EXAM: PORTABLE CHEST 1 VIEW COMPARISON:  01/12/2017 FINDINGS: Postoperative changes in the mediastinum. Cardiac pacemaker with lead tip along the left chest, unchanged in position. Cardiac enlargement. No vascular congestion or edema. No blunting of costophrenic angles. No pneumothorax. No focal consolidation. Calcification of the aorta. IMPRESSION: Cardiac enlargement.  No evidence of active pulmonary disease. Electronically Signed   By: Burman NievesWilliam  Stevens M.D.   On: 03/05/2017 01:16    Discharge Medications:   Allergies as of 03/06/2017      Reactions   Gabapentin (once-daily) Hypertension   Lisinopril Hives   Methotrexate Derivatives Other (See Comments)   blisters   Sulfa Antibiotics Hives   Tape Hives   Vancomycin Other (See Comments)   Blisters   Zanaflex [tizanidine Hcl]    Hypotensive stroke      Medication List    TAKE these medications   aspirin EC 81 MG tablet Take 81 mg by  mouth daily.   atorvastatin 80 MG tablet Commonly known as:  LIPITOR Take 1 tablet (80 mg total) by mouth daily at 6 PM.   calcium acetate 667 MG capsule Commonly known as:  PHOSLO Take 1 capsule (667 mg total) by mouth 3 (three) times daily with meals.   carvedilol 6.25 MG tablet Commonly known as:  COREG Take 1 tablet (6.25 mg total) by mouth at bedtime.   clopidogrel 75 MG tablet Commonly known as:  PLAVIX Take 1 tablet (75 mg total) by mouth daily.   cyclobenzaprine 10 MG tablet Commonly known as:  FLEXERIL Take 1 tablet (10 mg  total) 2 (two) times daily as needed by mouth for muscle spasms.   docusate sodium 100 MG capsule Commonly known as:  COLACE Take 100 mg by mouth 2 (two) times daily.   insulin NPH Human 100 UNIT/ML injection Commonly known as:  HUMULIN N,NOVOLIN N Inject 0.1 mLs (10 Units total) into the skin daily before breakfast.   insulin NPH Human 100 UNIT/ML injection Commonly known as:  HUMULIN N,NOVOLIN N Inject 0.65 mLs (65 Units total) into the skin at bedtime.   multivitamin Tabs tablet Take 1 tablet by mouth at bedtime.   nitroGLYCERIN 0.4 MG SL tablet Commonly known as:  NITROSTAT Place 0.4 mg under the tongue every 5 (five) minutes as needed for chest pain.   oxyCODONE 5 MG immediate release tablet Commonly known as:  Oxy IR/ROXICODONE Take 0.5 tablets (2.5 mg total) by mouth every 6 (six) hours as needed for severe pain.   polyethylene glycol packet Commonly known as:  MIRALAX / GLYCOLAX Take 17 g by mouth daily.   RELION INSULIN SYRINGE 31G X 15/64" 1 ML Misc Generic drug:  Insulin Syringe-Needle U-100   silver sulfADIAZINE 1 % cream Commonly known as:  SILVADENE Apply 1 application daily topically.   Vitamin D (Ergocalciferol) 50000 units Caps capsule Commonly known as:  DRISDOL Take 50,000 Units by mouth every 30 (thirty) days.       Diagnostic Studies: Dg Chest Port 1 View  Result Date: 03/05/2017 CLINICAL DATA:  Sepsis EXAM: PORTABLE CHEST 1 VIEW COMPARISON:  01/12/2017 FINDINGS: Postoperative changes in the mediastinum. Cardiac pacemaker with lead tip along the left chest, unchanged in position. Cardiac enlargement. No vascular congestion or edema. No blunting of costophrenic angles. No pneumothorax. No focal consolidation. Calcification of the aorta. IMPRESSION: Cardiac enlargement.  No evidence of active pulmonary disease. Electronically Signed   By: Burman NievesWilliam  Stevens M.D.   On: 03/05/2017 01:16    Patient benefited maximally from their hospital stay and there  were no complications.     Disposition: 06-Home-Health Care Svc Discharge Instructions    Call MD / Call 911   Complete by:  As directed    If you experience chest pain or shortness of breath, CALL 911 and be transported to the hospital emergency room.  If you develope a fever above 101 F, pus (white drainage) or increased drainage or redness at the wound, or calf pain, call your surgeon's office.   Constipation Prevention   Complete by:  As directed    Drink plenty of fluids.  Prune juice may be helpful.  You may use a stool softener, such as Colace (over the counter) 100 mg twice a day.  Use MiraLax (over the counter) for constipation as needed.   Diet - low sodium heart healthy   Complete by:  As directed    Increase activity slowly as tolerated   Complete  by:  As directed      Follow-up Information    Nadara Mustard, MD Follow up in 1 week(s).   Specialty:  Orthopedic Surgery Contact information: [address] [place] [address] [phone]            Signed: Nadara Mustard 03/06/2017, 1:09 PM

## 2017-03-06 NOTE — Progress Notes (Signed)
Report called to chelsea RN on rehab.  Pt to transfer to rm [id number]

## 2017-03-06 NOTE — Progress Notes (Signed)
Patient was in a chair besides his bed. Patient's wife on-site. Patient just went through double feet amputation and both patient and wife were in immense emotional distress. They both shared about their faith support in their new journey despite the challenges. Chaplain provided reflective listening, emotional support, compassionate presence and prayer.  Chaplain M. Gannett CoHolley

## 2017-03-06 NOTE — H&P (Addendum)
Physical Medicine and Rehabilitation Admission H&P    Chief complaint :Stump pain  HPI: [name] is a 75 y.o. 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 History taken from chart review, patient, and wife. Presented on 03/01/17 with progressive ischemic changes of the right foot as well as gangrenous changes along the previous left BKA. 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 Pt has been limited by pain, surgeries, and vacs. Wound vacs were later discontinued 03/05/2017 per Dr. Sharol Given. CCPD followed by renal services. Troponin mildly elevated 0.17 felt to be demand ischemia and monitored. Acute on chronic anemia and monitored. Bouts of hypokalemia with supplement added. Patient with low-grade fever and presently maintained on Zosyn with blood cultures negative and urine study pending. PM&R was asked to see the patient regarding potential for inpatient rehab admission. Patient was admitted for a comprehensive rehabilitation program.    Review of Systems  HENT: Negative for hearing loss.   Eyes: Negative for blurred vision and double vision.  Respiratory: Negative for cough.        Shortness of breath with exertion  Cardiovascular: Positive for leg swelling. Negative for chest pain.  Gastrointestinal: Positive for constipation. Negative for nausea.       GERD  Genitourinary: Negative for hematuria.  Musculoskeletal: Positive for joint pain and myalgias.  Skin: Negative for rash.  Neurological: Positive for focal weakness. Negative for seizures.  All other systems reviewed and are negative.  Past Medical History:  Diagnosis Date  . AICD  (automatic cardioverter/defibrillator) present    [employer] [phone] lead [id number]  . CHF (congestive heart failure) ([place])   . Complication of anesthesia   . COPD (chronic obstructive pulmonary disease) ([place])   . Coronary artery disease    CABG 2011  . Diabetes mellitus without complication ([place])   . ESRD on peritoneal dialysis [facility])    Started PD Dec 2016 in [place]. Nephrology is in [place].   . Gangrene ([place])    left knee  . GERD (gastroesophageal reflux disease)   . History of peritonitis    PD cath related peritonitis in April 2017  . Hypertension   . Peripheral vascular disease ([place])   . PONV (postoperative nausea and vomiting)   . Sjogren's syndrome ([place])    Per pt's wife, was diagnosed in 2016 during w/u for cause of renal failure prior to starting dialysis.  He had a rash on his chest apparently prompting this w/u.  Never had renal bx.  He was referred to a rheum MD in [place] and per the wife he was treated with MTX and other medications but had side effects to "all of it" and isn't taking anything for this as of Jun 2017.   [name] Sleep apnea    wears Bipap  . Stroke ([place])   . Wound dehiscence    Right foot   Past Surgical History:  Procedure Laterality Date  . ABDOMINAL AORTOGRAM W/LOWER EXTREMITY N/A 11/29/2016   Procedure: ABDOMINAL AORTOGRAM W/LOWER EXTREMITY;  Surgeon: Serafina Mitchell, MD;  Location: [place] CV LAB;  Service: Cardiovascular;  Laterality: N/A;  . AMPUTATION Left  11/29/2016   Procedure: LEFT THIRD TOE AMPUTATION;  Surgeon: Serafina Mitchell, MD;  Location: [facility] OR;  Service: Vascular;  Laterality: Left;  . AMPUTATION Left 12/23/2016   Procedure: Left Transmetatarsal Amputation;  Surgeon: Newt Minion, MD;  Location: [place];  Service: Orthopedics;  Laterality: Left;  . AMPUTATION Left 01/11/2017   Procedure: LEFT BELOW KNEE AMPUTATION;  Surgeon: Newt Minion, MD;  Location: [place];  Service: Orthopedics;  Laterality: Left;  .  AMPUTATION Right 01/20/2017   Procedure: AMPUTATION RIGHT FIFTH TOE;  Surgeon: Serafina Mitchell, MD;  Location: [place];  Service: Vascular;  Laterality: Right;  . AMPUTATION Right 03/01/2017   Procedure: RIGHT BELOW KNEE AMPUTATION;  Surgeon: Newt Minion, MD;  Location: [place];  Service: Orthopedics;  Laterality: Right;  . ANGIOPLASTY  12/15/2016   Procedure: BALLOON ANGIOPLASTY;  Surgeon: Waynetta Sandy, MD;  Location: [place];  Service: Vascular;;  . cardiac catherization with stent placement  2017  . COLONOSCOPY W/ BIOPSIES AND POLYPECTOMY    . CORONARY ARTERY BYPASS GRAFT  2011  . FALSE ANEURYSM REPAIR Right 12/15/2016   Procedure: REPAIR FALSE ANEURYSM;  Surgeon: Waynetta Sandy, MD;  Location: [place];  Service: Vascular;  Laterality: Right;  . IRRIGATION AND DEBRIDEMENT FOOT Left 12/15/2016   Procedure: IRRIGATION AND DEBRIDEMENT FOOT;  Surgeon: Waynetta Sandy, MD;  Location: [place];  Service: Vascular;  Laterality: Left;  . LEFT HEART CATH AND CORS/GRAFTS ANGIOGRAPHY N/A 01/09/2017   Procedure: LEFT HEART CATH AND CORS/GRAFTS ANGIOGRAPHY;  Surgeon: Lorretta Harp, MD;  Location: [place] CV LAB;  Service: Cardiovascular;  Laterality: N/A;  . LOWER EXTREMITY ANGIOGRAM Left 12/15/2016   Procedure: LOWER EXTREMITY ANGIOGRAM; PEDAL ACCESS;  Surgeon: Waynetta Sandy, MD;  Location: [place];  Service: Vascular;  Laterality: Left;  . PERIPHERAL VASCULAR BALLOON ANGIOPLASTY  11/29/2016   Procedure: PERIPHERAL VASCULAR BALLOON ANGIOPLASTY;  Surgeon: Serafina Mitchell, MD;  Location: [place] CV LAB;  Service: Cardiovascular;;  LT Peroneal AT attempted unsuccessful  . STUMP REVISION Left 03/01/2017   Procedure: REVISION LEFT BELOW KNEE AMPUTATION;  Surgeon: Newt Minion, MD;  Location: [place];  Service: Orthopedics;  Laterality: Left;   Family History  Problem Relation Age of Onset  . Heart disease Father   . Diabetes Mother    Social History:  reports  that he has quit smoking. His smoking use included pipe. he has never used smokeless tobacco. He reports that he does not drink alcohol or use drugs. Allergies:  Allergies  Allergen Reactions  . Gabapentin (Once-Daily) Hypertension  . Lisinopril Hives  . Methotrexate Derivatives Other (See Comments)    blisters  . Sulfa Antibiotics Hives  . Tape Hives  . Vancomycin Other (See Comments)    Blisters   . Zanaflex [Tizanidine Hcl]     Hypotensive stroke   Medications Prior to Admission  Medication Sig Dispense Refill  . aspirin EC 81 MG tablet Take 81 mg by mouth daily.    [name] atorvastatin (LIPITOR) 80 MG tablet Take 1 tablet (80 mg total) by mouth daily at 6 PM. 30 tablet 0  . calcium acetate (PHOSLO) 667 MG capsule Take 1 capsule (667 mg total) by mouth 3 (three) times daily with meals. 90 capsule 0  . carvedilol (COREG) 6.25 MG tablet Take 1 tablet (6.25 mg total) by mouth at bedtime. 30 tablet 0  . clopidogrel (PLAVIX) 75 MG tablet Take 1 tablet (75 mg total) by mouth daily.  30 tablet 0  . cyclobenzaprine (FLEXERIL) 10 MG tablet Take 1 tablet (10 mg total) 2 (two) times daily as needed by mouth for muscle spasms. 30 tablet 0  . docusate sodium (COLACE) 100 MG capsule Take 100 mg by mouth 2 (two) times daily.     . insulin NPH Human (HUMULIN N,NOVOLIN N) 100 UNIT/ML injection Inject 0.1 mLs (10 Units total) into the skin daily before breakfast. 10 mL 11  . insulin NPH Human (HUMULIN N,NOVOLIN N) 100 UNIT/ML injection Inject 0.65 mLs (65 Units total) into the skin at bedtime. 10 mL 11  . multivitamin (RENA-VIT) TABS tablet Take 1 tablet by mouth at bedtime.  0  . oxyCODONE (OXY IR/ROXICODONE) 5 MG immediate release tablet Take 0.5 tablets (2.5 mg total) by mouth every 6 (six) hours as needed for severe pain. 20 tablet 0  . polyethylene glycol (MIRALAX / GLYCOLAX) packet Take 17 g by mouth daily.    [name] RELION INSULIN SYRINGE 31G X 15/64" 1 ML MISC     . silver sulfADIAZINE (SILVADENE) 1 %  cream Apply 1 application daily topically. 50 g 0  . Vitamin D, Ergocalciferol, (DRISDOL) 50000 units CAPS capsule Take 50,000 Units by mouth every 30 (thirty) days.     . nitroGLYCERIN (NITROSTAT) 0.4 MG SL tablet Place 0.4 mg under the tongue every 5 (five) minutes as needed for chest pain.      Drug Regimen Review Drug regimen was reviewed and remains appropriate with no significant issues identified  Home: Home Living Family/patient expects to be discharged to:: Private residence Living Arrangements: Spouse/significant other Available Help at Discharge: Family, Available 24 hours/day Type of Home: Mobile home Home Access: Ramped entrance Home Layout: One level Home Equipment: Wheelchair - manual   Functional History: Prior Function Level of Independence: Needs assistance Gait / Transfers Assistance Needed: does sliding board transfers to w/c  Functional Status:  Mobility: Bed Mobility Overal bed mobility: Needs Assistance Bed Mobility: Supine to Sit Supine to sit: Mod assist General bed mobility comments: assist to elevate trunk for long sitting in bed Transfers Overall transfer level: Needs assistance Transfers: Anterior-Posterior Transfer Anterior-Posterior transfers: +2 physical assistance, Mod assist General transfer comment: cueing for sequencing/technique for scoot posteriorly into recliner. +2 assist using bed pad under pt for scooting. Ambulation/Gait General Gait Details: nonambulatory     ADL:    Cognition: Cognition Overall Cognitive Status: Within Functional Limits for tasks assessed Orientation Level: Oriented X4 Cognition Arousal/Alertness: Awake/alert Behavior During Therapy: WFL for tasks assessed/performed Overall Cognitive Status: Within Functional Limits for tasks assessed  Physical Exam: Blood pressure (!) 121/49, pulse 87, temperature 98.1 F (36.7 C), temperature source Oral, resp. rate 18, height _0  (1.753 m), weight 97.4 kg (214 lb  11.7 oz), SpO2 99 %. Physical Exam  Vitals reviewed. Constitutional: He is oriented to person, place, and time. He appears well-developed and well-nourished.  HENT:  Head: Normocephalic.  Ulcer on bridge of nose  Eyes: EOM are normal. Right eye exhibits no discharge. Left eye exhibits no discharge.  Neck: Normal range of motion. Neck supple. No thyromegaly present.  Cardiovascular: Normal rate, regular rhythm and normal heart sounds.  Respiratory: Effort normal and breath sounds normal. No respiratory distress. He has no wheezes.  GI: Soft. Bowel sounds are normal. He exhibits no distension. There is no tenderness.  Musculoskeletal: He exhibits edema and tenderness.  Neurological: He is alert and oriented to person, place, and time.  Motor: B/l UE 4+/5 proximal to distal RLE:  HF 2-/5 (pain inhibition) LLE: HF 3+/5 (pain inhibition)  Skin:  See above B/l BKA with dressing c/d/i  Psychiatric: He has a normal mood and affect. His behavior is normal.     Results for orders placed or performed during the hospital encounter of 03/01/17 (from the past 48 hour(s))  Glucose, capillary     Status: Abnormal   Collection Time: 03/04/17 11:50 AM  Result Value Ref Range   Glucose-Capillary 450 (H) 65 - 99 mg/dL  Glucose, capillary     Status: Abnormal   Collection Time: 03/04/17  1:43 PM  Result Value Ref Range   Glucose-Capillary 431 (H) 65 - 99 mg/dL  Glucose, capillary     Status: Abnormal   Collection Time: 03/04/17  5:29 PM  Result Value Ref Range   Glucose-Capillary 281 (H) 65 - 99 mg/dL  Glucose, capillary     Status: Abnormal   Collection Time: 03/04/17  9:37 PM  Result Value Ref Range   Glucose-Capillary 322 (H) 65 - 99 mg/dL  CBC     Status: Abnormal   Collection Time: 03/04/17 10:47 PM  Result Value Ref Range   WBC 12.5 (H) 4.0 - 10.5 K/uL   RBC 2.76 (L) 4.22 - 5.81 MIL/uL   Hemoglobin 8.1 (L) 13.0 - 17.0 g/dL   HCT 25.3 (L) 39.0 - 52.0 %   MCV 91.7 78.0 - 100.0 fL   MCH  29.3 26.0 - 34.0 pg   MCHC 32.0 30.0 - 36.0 g/dL   RDW 15.4 11.5 - 15.5 %   Platelets 287 150 - 400 K/uL  Basic metabolic panel     Status: Abnormal   Collection Time: 03/04/17 10:47 PM  Result Value Ref Range   Sodium 130 (L) 135 - 145 mmol/L   Potassium 2.5 (LL) 3.5 - 5.1 mmol/L    Comment: DELTA CHECK NOTED CRITICAL RESULT CALLED TO, READ BACK BY AND VERIFIED WITH: MINTZ A,RN 03/04/17 2347 WAYK    Chloride 97 (L) 101 - 111 mmol/L   CO2 25 22 - 32 mmol/L   Glucose, Bld 341 (H) 65 - 99 mg/dL   BUN 37 (H) 6 - 20 mg/dL   Creatinine, Ser 4.73 (H) 0.61 - 1.24 mg/dL   Calcium 7.3 (L) 8.9 - 10.3 mg/dL   GFR calc non Af Amer 11 (L) >60 mL/min   GFR calc Af Amer 13 (L) >60 mL/min    Comment: (NOTE) The eGFR has been calculated using the CKD EPI equation. This calculation has not been validated in all clinical situations. eGFR's persistently <60 mL/min signify possible Chronic Kidney Disease.    Anion gap 8 5 - 15  Culture, blood (routine x 2)     Status: None (Preliminary result)   Collection Time: 03/04/17 10:50 PM  Result Value Ref Range   Specimen Description BLOOD RIGHT ANTECUBITAL    Special Requests      BOTTLES DRAWN AEROBIC ONLY Blood Culture adequate volume   Culture NO GROWTH < 24 HOURS    Report Status PENDING   Culture, blood (routine x 2)     Status: None (Preliminary result)   Collection Time: 03/04/17 11:06 PM  Result Value Ref Range   Specimen Description BLOOD RIGHT HAND    Special Requests      BOTTLES DRAWN AEROBIC ONLY Blood Culture adequate volume   Culture NO GROWTH < 24 HOURS    Report Status PENDING   Lactic acid, plasma     Status: None   Collection  Time: 03/05/17 12:45 AM  Result Value Ref Range   Lactic Acid, Venous 1.4 0.5 - 1.9 mmol/L  Troponin I     Status: Abnormal   Collection Time: 03/05/17 12:45 AM  Result Value Ref Range   Troponin I 0.17 (HH) <0.03 ng/mL    Comment: CRITICAL RESULT CALLED TO, READ BACK BY AND VERIFIED WITH: DAVIS C,RN  03/05/17 0153 WAYK   Procalcitonin - Baseline     Status: None   Collection Time: 03/05/17 12:45 AM  Result Value Ref Range   Procalcitonin 0.41 ng/mL    Comment:        Interpretation: PCT (Procalcitonin) <= 0.5 ng/mL: Systemic infection (sepsis) is not likely. Local bacterial infection is possible. (NOTE)         ICU PCT Algorithm               Non ICU PCT Algorithm    ----------------------------     ------------------------------         PCT < 0.25 ng/mL                 PCT < 0.1 ng/mL     Stopping of antibiotics            Stopping of antibiotics       strongly encouraged.               strongly encouraged.    ----------------------------     ------------------------------       PCT level decrease by               PCT < 0.25 ng/mL       >= 80% from peak PCT       OR PCT 0.25 - 0.5 ng/mL          Stopping of antibiotics                                             encouraged.     Stopping of antibiotics           encouraged.    ----------------------------     ------------------------------       PCT level decrease by              PCT >= 0.25 ng/mL       < 80% from peak PCT        AND PCT >= 0.5 ng/mL            Continuin g antibiotics                                              encouraged.       Continuing antibiotics            encouraged.    ----------------------------     ------------------------------     PCT level increase compared          PCT > 0.5 ng/mL         with peak PCT AND          PCT >= 0.5 ng/mL             Escalation of antibiotics  strongly encouraged.      Escalation of antibiotics        strongly encouraged.   Lactic acid, plasma     Status: None   Collection Time: 03/05/17  3:56 AM  Result Value Ref Range   Lactic Acid, Venous 1.9 0.5 - 1.9 mmol/L  Glucose, capillary     Status: Abnormal   Collection Time: 03/05/17  7:38 AM  Result Value Ref Range   Glucose-Capillary 394 (H) 65 - 99 mg/dL  Troponin I      Status: Abnormal   Collection Time: 03/05/17  9:53 AM  Result Value Ref Range   Troponin I 0.15 (HH) <0.03 ng/mL    Comment: CRITICAL VALUE NOTED.  VALUE IS CONSISTENT WITH PREVIOUSLY REPORTED AND CALLED VALUE.  Glucose, capillary     Status: Abnormal   Collection Time: 03/05/17 11:46 AM  Result Value Ref Range   Glucose-Capillary 337 (H) 65 - 99 mg/dL  Magnesium     Status: Abnormal   Collection Time: 03/05/17  2:07 PM  Result Value Ref Range   Magnesium 1.6 (L) 1.7 - 2.4 mg/dL  Glucose, capillary     Status: Abnormal   Collection Time: 03/05/17  5:10 PM  Result Value Ref Range   Glucose-Capillary 230 (H) 65 - 99 mg/dL  Glucose, capillary     Status: Abnormal   Collection Time: 03/05/17  8:15 PM  Result Value Ref Range   Glucose-Capillary 329 (H) 65 - 99 mg/dL  CBC     Status: Abnormal   Collection Time: 03/06/17  2:05 AM  Result Value Ref Range   WBC 12.5 (H) 4.0 - 10.5 K/uL   RBC 2.70 (L) 4.22 - 5.81 MIL/uL   Hemoglobin 8.1 (L) 13.0 - 17.0 g/dL   HCT 25.6 (L) 39.0 - 52.0 %   MCV 94.8 78.0 - 100.0 fL   MCH 30.0 26.0 - 34.0 pg   MCHC 31.6 30.0 - 36.0 g/dL   RDW 15.7 (H) 11.5 - 15.5 %   Platelets 300 150 - 400 K/uL  Potassium     Status: None   Collection Time: 03/06/17  2:05 AM  Result Value Ref Range   Potassium 3.8 3.5 - 5.1 mmol/L    Comment: DELTA CHECK NOTED  Glucose, capillary     Status: Abnormal   Collection Time: 03/06/17  7:38 AM  Result Value Ref Range   Glucose-Capillary 358 (H) 65 - 99 mg/dL   Dg Chest Port 1 View  Result Date: 03/05/2017 CLINICAL DATA:  Sepsis EXAM: PORTABLE CHEST 1 VIEW COMPARISON:  01/12/2017 FINDINGS: Postoperative changes in the mediastinum. Cardiac pacemaker with lead tip along the left chest, unchanged in position. Cardiac enlargement. No vascular congestion or edema. No blunting of costophrenic angles. No pneumothorax. No focal consolidation. Calcification of the aorta. IMPRESSION: Cardiac enlargement.  No evidence of active  pulmonary disease. Electronically Signed   By: Lucienne Capers M.D.   On: 03/05/2017 01:16    Medical Problem List and Plan: 1.  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 Bilateral wound vacs discontinued 03/05/2017 per Dr. Sharol Given 2.  DVT Prophylaxis/Anticoagulation: Patient is bilateral BKA 3. Pain Management: Oxycodone as needed 4. Mood: Provide emotional support 5. Neuropsych: This patient is capable of making decisions on his own behalf. 6. Skin/Wound Care: Routine skin checks 7. Fluids/Electrolytes/Nutrition: Routine I&O's with follow-up chemistries 8. Acute on chronic anemia. Continue Aranesp 9. End-stage renal disease/CPPD. Follow-up renal services 10. Diabetes mellitus peripheral  neuropathy. Latest hemoglobin A1c 8.8. Lantus insulin 10 units daily at bedtime, NovoLog 3 units 3 times a day with meals. Check blood sugars before meals and at bedtime 11. CAD with CABG. Continue Plavix and aspirin. No chest pain or shortness of breath 12. Diastolic congestive heart failure. Monitor for any signs of fluid overload 13. SIRS. Currently maintained on Zosyn. Blood cultures no growth. Monitor for any fever 14. Hypertension. Coreg 6.25 mg daily at bedtime. Monitor for orthostasis 15. Hyperlipidemia. Lipitor 16. Constipation. Laxative assistance  Post Admission Physician Evaluation: 1. Preadmission assessment reviewed and changes made below. 2. Functional deficits secondary  to right BKA with revision of left BKA. 3. Patient is admitted to receive collaborative, interdisciplinary care between the physiatrist, rehab nursing staff, and therapy team. 4. Patient's level of medical complexity and substantial therapy needs in context of that medical necessity cannot be provided at a lesser intensity of care such as a SNF. 5. Patient has experienced substantial functional loss from his/her baseline which was documented above under the "Functional History"  and "Functional Status" headings.  Judging by the patient's diagnosis, physical exam, and functional history, the patient has potential for functional progress which will result in measurable gains while on inpatient rehab.  These gains will be of substantial and practical use upon discharge  in facilitating mobility and self-care at the household level. 48. Physiatrist will provide 24 hour management of medical needs as well as oversight of the therapy plan/treatment and provide guidance as appropriate regarding the interaction of the two. 7. 24 hour rehab nursing will assist with safety, skin/wound care, disease management, pain management and patient education  and help integrate therapy concepts, techniques,education, etc. 8. PT will assess and treat for/with: Lower extremity strength, range of motion, stamina, balance, functional mobility, safety, adaptive techniques and equipment, woundcare, coping skills, pain control, education.   Goals are: Supervision/Min A at wheelchair level. 9. OT will assess and treat for/with: ADL's, functional mobility, safety, upper extremity strength, adaptive techniques and equipment, wound mgt, ego support, and community reintegration.   Goals are: Supervision/Min A at wheelchair level. Therapy may not proceed with showering this patient. 10. Case Management and Social Worker will assess and treat for psychological issues and discharge planning. 11. Team conference will be held weekly to assess progress toward goals and to determine barriers to discharge. 12. Patient will receive at least 3 hours of therapy per day at least 5 days per week. 13. ELOS: 10-14 days.       14. Prognosis:  good   Delice Lesch, MD, ABPMR Lavon Paganini Angiulli, PA-C 03/06/2017

## 2017-03-06 NOTE — IPOC Note (Signed)
Overall Plan of Care [facility](IPOC) Patient Details Name: [name] MRN: [id number] DOB: 08-10-1940  Admitting Diagnosis: B/l BKA  Hospital Problems: Active Problems:   S/P bilateral below knee amputation (HCC)   Essential hypertension   Acute combined systolic and diastolic congestive heart failure (HCC)   Poorly controlled type 2 diabetes mellitus with peripheral neuropathy (HCC)     Functional Problem List: Nursing Bowel, Medication Management, Pain, Skin Integrity, Motor, Nutrition, Endurance, Safety  PT Balance, Endurance, Pain, Sensory, Skin Integrity  OT Balance, Endurance, Motor, Skin Integrity, Safety  SLP    TR         Basic ADL's: OT Bathing, Dressing, Toileting     Advanced  ADL's: OT       Transfers: PT Bed Mobility, Bed to Chair, Car, Oncologisturniture  OT Toilet     Locomotion: PT Wheelchair Mobility     Additional Impairments: OT None  SLP        TR      Anticipated Outcomes Item Anticipated Outcome  Self Feeding no goal  Swallowing      Basic self-care  min A LB self care  Toileting  min A   Bathroom Transfers min A  Bowel/Bladder  Pt will manage bowel and bladder with min assist at discharge   Transfers  Min A   Locomotion  supervision w/c mobility only (bilateral BKA) - in home environment  Communication     Cognition     Pain  Less than 3 out of 10.   Safety/Judgment  Pt will remain free of skin breakdown wih min assist at discharge.    Therapy Plan: PT Intensity: Minimum of 1-2 x/day ,45 to 90 minutes PT Frequency: 5 out of 7 days PT Duration Estimated Length of Stay: 10-14 days OT Intensity: Minimum of 1-2 x/day, 45 to 90 minutes OT Frequency: 5 out of 7 days OT Duration/Estimated Length of Stay: 12-14 days      Team Interventions: Nursing Interventions Patient/Family Education, Pain Management, Medication Management, Bowel Management, Disease Management/Prevention, Psychosocial Support, Cognitive Remediation/Compensation,  Skin Care/Wound Management, Discharge Planning  PT interventions Balance/vestibular training, Community reintegration, Discharge planning, Cognitive remediation/compensation, Functional mobility training, Neuromuscular re-education, DME/adaptive equipment instruction, Disease management/prevention, Pain management, Patient/family education, Therapeutic Activities, Therapeutic Exercise, Psychosocial support, Skin care/wound management, UE/LE Strength taining/ROM, Splinting/orthotics, UE/LE Coordination activities, Wheelchair propulsion/positioning, Visual/perceptual remediation/compensation  OT Interventions Warden/rangerBalance/vestibular training, Discharge planning, Disease mangement/prevention, DME/adaptive equipment instruction, Functional mobility training, Pain management, Patient/family education, Psychosocial support, Self Care/advanced ADL retraining, Skin care/wound managment, Therapeutic Activities, Therapeutic Exercise, UE/LE Strength taining/ROM, Wheelchair propulsion/positioning  SLP Interventions    TR Interventions    SW/CM Interventions Discharge Planning, Psychosocial Support, Patient/Family Education   Barriers to Discharge MD  Medical stability, IV antibiotics, Lack of/limited family support and PD  Nursing      PT Inaccessible home environment, Home environment access/layout, Wound Care Wife very involved and received a lot of EDU during last admission, has ramp   OT      SLP      SW       Team Discharge Planning: Destination: PT-Home ,OT- Home , SLP-  Projected Follow-up: PT-Home health PT, OT-  Home health OT, SLP-  Projected Equipment Needs: PT-To be determined, OT- None recommended by OT, SLP-  Equipment Details: PT-family has commode, slide board, w/c (manual and motorized loaner) from previous admission , OT-  Patient/family involved in discharge planning: PT- Patient, Family member/caregiver,  OT-Patient, Family member/caregiver, SLP-   MD ELOS: 10-14  days. Medical Rehab  Prognosis:  Good Assessment:  76 y.o. 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 Presented on 03/01/17 with progressive ischemic changes of the right foot as well as gangrenous changes along the previous left BKA. 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 Pt has been limited by pain, surgeries, and vacs. Wound vacs were later discontinued 03/05/2017 per [name]. CCPD followed by renal services. Troponin mildly elevated 0.17 felt to be demand ischemia and monitored. Acute on chronic anemia and monitored. Bouts of hypokalemia with supplement added. Patient with low-grade fever and presently maintained on Zosyn with blood cultures negative and urine study pending. Pt with resulting functional deficits with mobility, transfers, self-care.  Will set goals for Min A most tasks with PT/OT.  See Team Conference Notes for weekly updates to the plan of care

## 2017-03-06 NOTE — Progress Notes (Signed)
Physical Therapy Treatment Patient Details Name: Joshua KitchenJames D Moffa MRN: [id number] DOB: 01-Aug-1940 Today's Date: 03/06/2017    History of Present Illness Quita SkyeJames D Comptonis a 76 y.o.malenow s/p bilateral Transtibial ampuations (new right TTA and revision of left).  PMH is significant for HTN, CVA, ESRD on PD, DM on insulin, COPD and CHFw AICD, hx CAD/ CABG 2011.      PT Comments    Pt very motivated to participate in therapy. PT concentrating on anterior-posterior transfers into recliner. Also reviewed amputee HEP with emphasis on quad sets.    Follow Up Recommendations  CIR     Equipment Recommendations  None recommended by PT    Recommendations for Other Services       Precautions / Restrictions Precautions Precautions: Fall Restrictions Other Position/Activity Restrictions: bilateral transtibial amputations    Mobility  Bed Mobility Overal bed mobility: Needs Assistance Bed Mobility: Supine to Sit     Supine to sit: Mod assist     General bed mobility comments: assist to elevate trunk for long sitting in bed  Transfers Overall transfer level: Needs assistance   Transfers: Anterior-Posterior Transfer       Anterior-Posterior transfers: +2 physical assistance;Mod assist   General transfer comment: cueing for sequencing/technique for scoot posteriorly into recliner. +2 assist using bed pad under pt for scooting.  Ambulation/Gait             General Gait Details: nonambulatory    Stairs            Wheelchair Mobility    Modified Rankin (Stroke Patients Only)       Balance Overall balance assessment: Needs assistance Sitting-balance support: Bilateral upper extremity supported Sitting balance-Leahy Scale: Zero Sitting balance - Comments: continuous verbal cues to activate abdominal muscles to maintain sitting balance. Postural control: Posterior lean                                  Cognition Arousal/Alertness:  Awake/alert Behavior During Therapy: WFL for tasks assessed/performed Overall Cognitive Status: Within Functional Limits for tasks assessed                                        Exercises General Exercises - Lower Extremity Quad Sets: AROM;Both;15 reps;Seated    General Comments General comments (skin integrity, edema, etc.): Wife present during session and encouraging pt.       Pertinent Vitals/Pain Pain Assessment: Faces Faces Pain Scale: Hurts whole lot Pain Location: BLE during transfer Pain Descriptors / Indicators: Grimacing;Guarding;Moaning Pain Intervention(s): Limited activity within patient's tolerance;Monitored during session;Repositioned    Home Living                      Prior Function            PT Goals (current goals can now be found in the care plan section) Acute Rehab PT Goals Patient Stated Goal: move better without pain PT Goal Formulation: With patient/family Time For Goal Achievement: 03/17/17 Potential to Achieve Goals: Good Progress towards PT goals: Progressing toward goals    Frequency    Min 3X/week      PT Plan Current plan remains appropriate    Co-evaluation              AM-PAC PT "6 Clicks" Daily Activity  Outcome Measure  Difficulty turning over in bed (including adjusting bedclothes, sheets and blankets)?: A Lot Difficulty moving from lying on back to sitting on the side of the bed? : Unable Difficulty sitting down on and standing up from a chair with arms (e.g., wheelchair, bedside commode, etc,.)?: Unable Help needed moving to and from a bed to chair (including a wheelchair)?: A Lot Help needed walking in hospital room?: Total Help needed climbing 3-5 steps with a railing? : Total 6 Click Score: 8    End of Session   Activity Tolerance: Patient limited by pain Patient left: in chair;with call bell/phone within reach;with family/visitor present;Other (comment)(with lift pad in chair per  nursing request) Nurse Communication: Mobility status PT Visit Diagnosis: Other abnormalities of gait and mobility (R26.89)     Time: [id number] PT Time Calculation (min) (ACUTE ONLY): 25 min  Charges:  $Therapeutic Activity: 23-37 mins                    G Codes:       Aida RaiderWendy Ramona Ruark, PT  Office # [phone] Pager [phone]    Ilda FoilGarrow, Yussef Jorge Rene 03/06/2017, 10:08 AM

## 2017-03-06 NOTE — Progress Notes (Signed)
Pt was given miralax this am and a soap suds enema this afternoon. Pt had very small soft brown stool afterwards.

## 2017-03-06 NOTE — PMR Pre-admission (Signed)
PMR Admission Coordinator Pre-Admission Assessment  Patient: Joshua [name] is an 76 y.o., male MRN: [id number] DOB: 1940/05/15 Height: 5\' 9"  (175.3 cm) Weight: 97.4 kg (214 lb 11.7 oz)              Insurance Information HMO: No    PPO:       PCP:       IPA:       80/20:       OTHER:   PRIMARY:  Medicare A and B      Policy#: [id number]      Subscriber: patient CM Name:        Phone#:       Fax#:   Pre-Cert#:        Employer:  Retired Benefits:  Phone #:       Name: Checked in Passport one source [employer]ff. Date: 02/09/06     Deduct: $1340      Out of Pocket Max: none      Life Max: N/A CIR: 100%      SNF: 100 days Outpatient: 80%     Co-Pay: 20% Home Health: 100%      Co-Pay: none DME: 80%     Co-Pay: 20% Providers: patient's choice  SECONDARY: BCBS      Policy#: [id number]      Subscriber: patient CM Name:        Phone#:       Fax#:   Pre-Cert#:        Employer:  Retired Benefits:  Phone #: [phone]      Name:   Eff. Date:       Deduct:        Out of Pocket Max:        Life Max:   CIR:        SNF:   Outpatient:       Co-Pay:   Home Health:        Co-Pay:   DME:       Co-Pay:    Emergency Contact Information Contact Information    Name Relation Home Work Mobile   Mistry,[name] Spouse [phone]  [phone]     Current Medical History  Patient Admitting Diagnosis:  New L BKA, old R BKA  History of Present Illness: A 76 y.o. 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 Pt has been limited by pain, surgeries, and vacs. PM&R was asked to see the patient regarding potential for inpatient rehab admission.   Past Medical History  Past Medical History:  Diagnosis Date  . AICD (automatic  cardioverter/defibrillator) present    [employer] [phone] [id number] lead [id number]  . CHF (congestive heart failure) (HCC)   . Complication of anesthesia   . COPD (chronic obstructive pulmonary disease) (HCC)   . Coronary artery disease    CABG 2011  . Diabetes mellitus without complication (HCC)   . [facility]D on [facility](HCC)    [place]   . Gangrene (HCC)    left knee  . GERD (gastroesophageal reflux disease)   . History of peritonitis    PD cath  related peritonitis in April 2017  . Hypertension   . Peripheral vascular disease (HCC)   . PONV (postoperative nausea and vomiting)   . Sjogren's syndrome (HCC)    Per pt's wife, was diagnosed in 2016 during w/u for cause of renal failure prior to starting dialysis.  He had a rash on his chest apparently prompting this w/u.  Never had renal bx.  He was referred to a rheum MD in [place] and per the wife he was treated with MTX and other medications but had side effects to "all of it" and isn't taking anything for this as of Jun 2017.   Joshua Kitchen Sleep apnea    wears Bipap  . Stroke (HCC)   . Wound dehiscence    Right foot    Family History  family history includes Diabetes in his mother; Heart disease in his father.  Prior Rehab/Hospitalizations: Was recently on CIR 01/17/17 and 12/23/16.  Has the patient had major surgery during 100 days prior to admission? Yes  Current Medications   Current Facility-Administered Medications:  .  0.9 %  sodium chloride infusion, , Intravenous, Continuous, Nadara Mustard, MD, Last Rate: 10 mL/hr at 03/01/17 1554 .  acetaminophen (TYLENOL) tablet 650 mg, 650 mg, Oral, Q4H PRN, 650 mg at 03/04/17 2251 **OR** acetaminophen (TYLENOL) suppository 650 mg, 650 mg, Rectal, Q4H PRN, Nadara Mustard, MD .  aspirin EC tablet 81 mg, 81 mg, Oral, Daily, Nadara Mustard, MD, 81 mg at 03/06/17 0916 .  atorvastatin (LIPITOR) tablet 80 mg, 80 mg, Oral, q1800,  Nadara Mustard, MD, 80 mg at 03/05/17 1755 .  bisacodyl (DULCOLAX) suppository 10 mg, 10 mg, Rectal, Daily PRN, Nadara Mustard, MD .  carvedilol (COREG) tablet 6.25 mg, 6.25 mg, Oral, QHS, Nadara Mustard, MD, 6.25 mg at 03/05/17 2206 .  clopidogrel (PLAVIX) tablet 75 mg, 75 mg, Oral, Daily, Nadara Mustard, MD, 75 mg at 03/06/17 0916 .  Darbepoetin Alfa (ARANESP) injection 150 mcg, 150 mcg, Subcutaneous, Q Fri-1800, Pola Corn, NP .  dialysis solution 1.5% low-MG/low-CA dianeal solution, , Intraperitoneal, Q24H, Annie Sable, MD, 5,000 mL at 03/04/17 1719 .  dialysis solution 1.5% low-MG/low-CA dianeal solution, , Intraperitoneal, Q24H, Annie Sable, MD .  dialysis solution 2.5% low-MG/low-CA dianeal solution, , Intraperitoneal, Q24H, Annie Sable, MD .  docusate sodium (COLACE) capsule 100 mg, 100 mg, Oral, BID, Nadara Mustard, MD, 100 mg at 03/06/17 0916 .  feeding supplement (NEPRO CARB STEADY) liquid 237 mL, 237 mL, Oral, BID BM, Julien Nordmann, PA-C, 237 mL at 03/06/17 1034 .  feeding supplement (PRO-STAT SUGAR FREE 64) liquid 30 mL, 30 mL, Oral, BID, Pola Corn, NP, 30 mL at 03/06/17 0916 .  gentamicin cream (GARAMYCIN) 0.1 % 1 application, 1 application, Topical, Daily, Annie Sable, MD, 1 application at 03/06/17 1000 .  heparin 1,500 Units in dialysis solution 1.5% low-MG/low-CA 3,000 mL dialysis solution, , Peritoneal Dialysis, PRN, Nadara Mustard, MD .  heparin 1000 unit/ml injection 2,500 Units, 2,500 Units, Intraperitoneal, PRN, Annie Sable, MD .  heparin 2,500 Units in dialysis solution 1.5% low-MG/low-CA 5,000 mL dialysis solution, , Peritoneal Dialysis, PRN, Nadara Mustard, MD .  heparin 2,500 Units in dialysis solution 2.5% low-MG/low-CA 5,000 mL dialysis solution, , Peritoneal Dialysis, PRN, Nadara Mustard, MD .  HYDROmorphone (DILAUDID) injection 1 mg, 1 mg, Intravenous, Q2H PRN, Nadara Mustard, MD, 1 mg at 03/05/17  1134 .  insulin aspart (novoLOG) injection 0-9 Units, 0-9 Units,  Subcutaneous, TID WC, Nadara Mustard, MD, 9 Units at 03/06/17 [phone] .  insulin aspart (novoLOG) injection 1 Units, 1 Units, Subcutaneous, Once, Penny Pia, MD .  insulin aspart (novoLOG) injection 3 Units, 3 Units, Subcutaneous, TID WC, Nadara Mustard, MD, 3 Units at 03/06/17 1253 .  insulin glargine (LANTUS) injection 10 Units, 10 Units, Subcutaneous, QHS, Nadara Mustard, MD, 10 Units at 03/05/17 2206 .  linezolid (ZYVOX) IVPB 600 mg, 600 mg, Intravenous, Q12H, Eduard Clos, MD, Last Rate: 300 mL/hr at 03/06/17 0915, 600 mg at 03/06/17 0915 .  magnesium citrate solution 1 Bottle, 1 Bottle, Oral, Once PRN, Nadara Mustard, MD .  metoCLOPramide (REGLAN) tablet 5-10 mg, 5-10 mg, Oral, Q8H PRN **OR** metoCLOPramide (REGLAN) injection 5-10 mg, 5-10 mg, Intravenous, Q8H PRN, Nadara Mustard, MD .  multivitamin (RENA-VIT) tablet 1 tablet, 1 tablet, Oral, QHS, Nadara Mustard, MD, 1 tablet at 03/05/17 2206 .  nitroGLYCERIN (NITROSTAT) SL tablet 0.4 mg, 0.4 mg, Sublingual, Q5 min PRN, Nadara Mustard, MD .  ondansetron [facility]) tablet 4 mg, 4 mg, Oral, Q6H PRN **OR** ondansetron (ZOFRAN) injection 4 mg, 4 mg, Intravenous, Q6H PRN, Nadara Mustard, MD, 4 mg at 03/01/17 1351 .  oxyCODONE (Oxy IR/ROXICODONE) immediate release tablet 10 mg, 10 mg, Oral, Q3H PRN, Nadara Mustard, MD, 10 mg at 03/06/17 1253 .  oxyCODONE (Oxy IR/ROXICODONE) immediate release tablet 5 mg, 5 mg, Oral, Q3H PRN, Nadara Mustard, MD, 5 mg at 03/02/17 0306 .  piperacillin-tazobactam (ZOSYN) IVPB 2.25 g, 2.25 g, Intravenous, Q8H, Jamon, [place], RPH, Last Rate: 100 mL/hr at 03/06/17 1510, 2.25 g at 03/06/17 1510 .  polyethylene glycol (MIRALAX / GLYCOLAX) packet 17 g, 17 g, Oral, Daily PRN, Nadara Mustard, MD, 17 g at 03/06/17 0916 .  polyethylene glycol (MIRALAX / GLYCOLAX) packet 17 g, 17 g, Oral, q morning - 10a, Zeyfang, David, PA-C .  sorbitol 70 % solution 30 mL,  30 mL, Oral, QID PRN, Lenny Pastel, PA-C  Patients Current Diet: Diet Carb Modified Fluid consistency: Thin; Room service appropriate? Yes Diet - low sodium heart healthy  Precautions / Restrictions Precautions Precautions: Fall Precaution Comments: issued amputee HEP Restrictions Weight Bearing Restrictions: No(bil bka) Other Position/Activity Restrictions: bilateral transtibial amputations   Has the patient had 2 or more falls or a fall with injury in the past year?No  Prior Activity Level Limited Community (1-2x/wk): Went out 2-3 X a week.  Has not been driving since ICD placed 9/60/45.  Wife drives.  Home Assistive Devices / Equipment Home Assistive Devices/Equipment: Wheelchair, CBG Meter, Eyeglasses Home Equipment: Wheelchair - manual  Prior Device Use: Indicate devices/aids used by the patient prior to current illness, exacerbation or injury? Manual wheelchair  Prior Functional Level Prior Function Level of Independence: Needs assistance Gait / Transfers Assistance Needed: does sliding board transfers to w/c  Self Care: Did the patient need help bathing, dressing, using the toilet or eating?  Needed some help  Indoor Mobility: Did the patient need assistance with walking from room to room (with or without device)? Independent  Stairs: Did the patient need assistance with internal or external stairs (with or without device)? Needed some help  Functional Cognition: Did the patient need help planning regular tasks such as shopping or remembering to take medications? Independent  Current Functional Level Cognition  Overall Cognitive Status: Within Functional Limits for tasks assessed Orientation Level: Oriented X4    Extremity Assessment (includes Sensation/Coordination)  Upper Extremity Assessment:  Overall WFL for tasks assessed  Lower Extremity Assessment: RLE deficits/detail, LLE deficits/detail RLE: Unable to fully assess due to pain LLE: Unable to fully  assess due to pain    ADLs       Mobility  Overal bed mobility: Needs Assistance Bed Mobility: Supine to Sit Supine to sit: Mod assist General bed mobility comments: assist to elevate trunk for long sitting in bed    Transfers  Overall transfer level: Needs assistance Transfers: Anterior-Posterior Transfer Anterior-Posterior transfers: +2 physical assistance, Mod assist General transfer comment: cueing for sequencing/technique for scoot posteriorly into recliner. +2 assist using bed pad under pt for scooting.    Ambulation / Gait / Stairs / Wheelchair Mobility  Ambulation/Gait General Gait Details: nonambulatory     Posture / Balance Dynamic Sitting Balance Sitting balance - Comments: continuous verbal cues to activate abdominal muscles to maintain sitting balance. Balance Overall balance assessment: Needs assistance Sitting-balance support: Bilateral upper extremity supported Sitting balance-Leahy Scale: Zero Sitting balance - Comments: continuous verbal cues to activate abdominal muscles to maintain sitting balance. Postural control: Posterior lean    Special needs/care consideration BiPAP/CPAP Yes, Bipap CPM No Continuous Drip IV No Dialysis Yes, PD nightly        Life Vest No, has an ICD/defibrillator in place Oxygen No Special Bed No Trach Size No Wound Vac (area) No      Skin Dressings intact to old R BKA and new L BKA sites                              Bowel mgmt: Last BM 02/28/17 Bladder mgmt: Oliguria Diabetic mgmt Yes, on insulin at home    Previous Home Environment Living Arrangements: Spouse/significant other Available Help at Discharge: Family, Available 24 hours/day Type of Home: Mobile home Home Layout: One level Home Access: Ramped entrance Home Care Services: No  Discharge Living Setting Plans for Discharge Living Setting: Lives with (comment), Apartment(Lives with wife in a single wide mobile home.) Type of Home at Discharge: Mobile  home Discharge Home Layout: One level Discharge Home Access: Stairs to enter Entrance Stairs-Number of Steps: 1 step up and then threshold step into mobile home Does the patient have any problems obtaining your medications?: No  Social/Family/Support Systems Patient Roles: Spouse, Parent(Has a wife, daughter, and a son.) Contact Information: Estanislado Surgeon - wife Anticipated Caregiver: wife Anticipated Caregiver's Contact Information: Burna Mortimer - wife - [phone] Ability/Limitations of Caregiver: Wife does not work and can assist at home. Caregiver Availability: 24/7 Discharge Plan Discussed with Primary Caregiver: Yes Is Caregiver In Agreement with Plan?: Yes Does Caregiver/Family have Issues with Lodging/Transportation while Pt is in Rehab?: No  Goals/Additional Needs Patient/Family Goal for Rehab: PT mod I and supervision, OT mod I to supervision to min assist goals Expected length of stay: 8-13 days Cultural Considerations: None Dietary Needs: Carb mod, med cal, thin liquids Equipment Needs: TBD Special Service Needs: Is on peritoneal dialysis nightly Pt/Family Agrees to Admission and willing to participate: Yes Program Orientation Provided & Reviewed with Pt/Caregiver Including Roles  & Responsibilities: Yes  Decrease burden of Care through IP rehab admission: N/A  Possible need for SNF placement upon discharge: Not planned  Patient Condition: This patient's medical and functional status has changed since the consult dated: 03/04/17 in which the Rehabilitation Physician determined and documented that the patient's condition is appropriate for intensive rehabilitative care in an inpatient rehabilitation facility. See "History of Present Illness" (  above) for medical update. Functional changes are:  Currently requiring mod assist +2 for anterior/posterior transfers. Patient's medical and functional status update has been discussed with the Rehabilitation physician and patient remains  appropriate for inpatient rehabilitation. Will admit to inpatient rehab today.  Preadmission Screen Completed By:  Trish MageLogue, Eliakim Tendler M, 03/06/2017 3:30 PM ______________________________________________________________________   Discussed status with Dr. Allena Katzpatel on 03/06/17 at 1529 and received telephone approval for admission today.  Admission Coordinator:  Trish MageLogue, Marai Teehan M, time 1529/Date 03/06/17

## 2017-03-06 NOTE — Progress Notes (Signed)
Pt and wife informed that pt is being transferred to inpt rehab.  All questions answered.  All belongings with pt.  Awaiting return call from  rehab for report.

## 2017-03-06 NOTE — Progress Notes (Signed)
Subjective:  Co constipation , tolerated CCPD, noted PT recommends [facility]., Formal consult pending , Per wife and pt avf NEVER USED , can do ccpd in hosp , IF  Rehab  Denies in hosp rehab needs [facility]  CCPD [facility]  Objective Vital signs in last 24 hours: Vitals:   03/05/17 0839 03/05/17 1645 03/05/17 2017 03/06/17 0458  BP: 111/76 (!) 128/54 129/67 130/66  Pulse: 73 100 (!) 107 95  Resp: 18 18 19 18   Temp: 98.4 F (36.9 C) 98.5 F (36.9 C) 98.2 F (36.8 C) 98.5 F (36.9 C)  TempSrc: Oral Oral Oral Oral  SpO2: 98% 100% 100% 99%  Weight: 97.6 kg (215 lb 2.7 oz)  97.4 kg (214 lb 11.7 oz)   Height:       Weight change: -1 kg (-3.3 oz)  Physical Exam: General:  Alert eating brk with wife assisting , NAD  ,OX3, appropriate  Heart: RRR; No m, r, g Lungs: CTA  bilat. Abdomen: soft, non-tender., non distended,  PD cath in place. Extremities:  L BKA and new R BKA  Dressing dry/clean Dialysis Access: PD cath,  L AVF + thrill, bruit nmot developed well   Dialysis Orders: [facility] [place] [facility]. 6 exchanges/day one mid-day exchange (occurs around 9am).  2.5 liters of 2.5% per CCPD 2 liters of 2.5% for last fill and mid day drain. Dr. Denny PeonBaveja    Problem/Plan: 1. PAD (s/p revision of left BKA and new right BKA on 11/21): Dr Lajoyce Cornersuda following. Anticipate DC  for rehab, possibly dc to CIR./ if SNF =Needs P. Cath for HD as above  2. ESRD:on CCPD, [place] in [place]. Following home CCPD Rx/ has had some Low bps so  all 1.5% 3.Hypo[place]ia =  K2.5 yest. 3.8 today with po k , eating brk now / ck am renal panel  lab 4. Hypertension/Vol.= CXR yest  NAD, bp Controlled now prior lowish - only on  coreg- 6.25 mg bid will hold ifSBP less than 100-  Need to be cautious with muscle relaxers and narcotics 5 Anemia of ESRD:Hgb dropped to 8.1>8.1 . S/p Aranesp 150 mcg SQ 11/23. 6. Metabolic Bone Disease:Phoslo on hold for phos 3.6  on 11/23 , Ca ok. Follow  labs., no vit d 7. Malnutrition: Alb 1.6 <1.8  On PD  . Continue Pro-stat, adding Nepro. 8. ?UTI: Urinalysis concerning, UCx  Still pending. BC no growth, finished  short-course of Cipro.  9. OSA - CPAP use 10. DM- per Admit    Joshua Pastelavid Zeyfang, PA-C [facility] Beeper [phone] 03/06/2017,9:36 AM  LOS: 5 days   Pt seen, examined and agree w A/P as above.  Joshua Moselleob Joshua Makarewicz MD [facility] pager [phone]   03/06/2017, 4:11 PM    Labs: Basic Metabolic Panel: Recent Labs  Lab 03/01/17 1006 03/03/17 0654 03/04/17 2247 03/06/17 0205  NA 135 132* 130*  --   K 3.7 3.5 2.5* 3.8  CL 99* 93* 97*  --   CO2 25 29 25   --   GLUCOSE 196* 350* 341*  --   BUN 28* 33* 37*  --   CREATININE 4.88* 5.55* 4.73*  --   CALCIUM 9.2 8.4* 7.3*  --   PHOS  --  3.6  --   --    Liver Function Tests: Recent Labs  Lab 03/01/17 1006 03/03/17 0654  AST 38  --   ALT 18  --   ALKPHOS 86  --  BILITOT 0.9  --   PROT 5.7*  --   ALBUMIN 1.8* 1.6*   CBC: Recent Labs  Lab 03/01/17 1006 03/03/17 0654 03/04/17 2247 03/06/17 0205  WBC 10.8* 10.6* 12.5* 12.5*  HGB 11.1* 9.0* 8.1* 8.1*  HCT 34.4* 28.4* 25.3* 25.6*  MCV 93.5 95.0 91.7 94.8  PLT 367 313 287 300   Cardiac Enzymes: Recent Labs  Lab 03/05/17 0045 03/05/17 0953  TROPONINI 0.17* 0.15*   CBG: Recent Labs  Lab 03/05/17 0738 03/05/17 1146 03/05/17 1710 03/05/17 2015 03/06/17 0738  GLUCAP 394* 337* 230* 329* 358*    Studies/Results: Dg Chest Port 1 View  Result Date: 03/05/2017 CLINICAL DATA:  Sepsis EXAM: PORTABLE CHEST 1 VIEW COMPARISON:  01/12/2017 FINDINGS: Postoperative changes in the mediastinum. Cardiac pacemaker with lead tip along the left chest, unchanged in position. Cardiac enlargement. No vascular congestion or edema. No blunting of costophrenic angles. No pneumothorax. No focal consolidation. Calcification of the aorta. IMPRESSION: Cardiac enlargement.  No evidence of active pulmonary  disease. Electronically Signed   By: Burman NievesWilliam  Stevens M.D.   On: 03/05/2017 01:16   Medications: . sodium chloride 10 mL/hr at 03/01/17 1554  . dialysis solution 1.5% low-MG/low-CA    . dialysis solution 1.5% low-MG/low-CA    . dialysis solution 2.5% low-MG/low-CA    . linezolid (ZYVOX) IV 600 mg (03/06/17 0915)  . piperacillin-tazobactam (ZOSYN)  IV Stopped (03/06/17 0618)   . aspirin EC  81 mg Oral Daily  . atorvastatin  80 mg Oral q1800  . carvedilol  6.25 mg Oral QHS  . clopidogrel  75 mg Oral Daily  . darbepoetin (ARANESP) injection - NON-DIALYSIS  150 mcg Subcutaneous Q Fri-1800  . docusate sodium  100 mg Oral BID  . feeding supplement (NEPRO CARB STEADY)  237 mL Oral BID BM  . feeding supplement (PRO-STAT SUGAR FREE 64)  30 mL Oral BID  . gentamicin cream  1 application Topical Daily  . insulin aspart  0-9 Units Subcutaneous TID WC  . insulin aspart  3 Units Subcutaneous TID WC  . insulin glargine  10 Units Subcutaneous QHS  . multivitamin  1 tablet Oral QHS  . polyethylene glycol  17 g Oral q morning - 10a

## 2017-03-07 ENCOUNTER — Inpatient Hospital Stay (HOSPITAL_COMMUNITY): Payer: Medicare Other

## 2017-03-07 ENCOUNTER — Inpatient Hospital Stay (HOSPITAL_COMMUNITY): Payer: Medicare Other | Admitting: Physical Therapy

## 2017-03-07 ENCOUNTER — Inpatient Hospital Stay (HOSPITAL_COMMUNITY): Payer: Medicare Other | Admitting: Occupational Therapy

## 2017-03-07 ENCOUNTER — Other Ambulatory Visit: Payer: Self-pay

## 2017-03-07 DIAGNOSIS — D62 Acute posthemorrhagic anemia: Secondary | ICD-10-CM

## 2017-03-07 DIAGNOSIS — Z89512 Acquired absence of left leg below knee: Secondary | ICD-10-CM

## 2017-03-07 DIAGNOSIS — E1165 Type 2 diabetes mellitus with hyperglycemia: Secondary | ICD-10-CM

## 2017-03-07 DIAGNOSIS — I10 Essential (primary) hypertension: Secondary | ICD-10-CM

## 2017-03-07 DIAGNOSIS — Z89511 Acquired absence of right leg below knee: Secondary | ICD-10-CM

## 2017-03-07 DIAGNOSIS — I5041 Acute combined systolic (congestive) and diastolic (congestive) heart failure: Secondary | ICD-10-CM

## 2017-03-07 DIAGNOSIS — E1142 Type 2 diabetes mellitus with diabetic polyneuropathy: Secondary | ICD-10-CM

## 2017-03-07 DIAGNOSIS — D72829 Elevated white blood cell count, unspecified: Secondary | ICD-10-CM

## 2017-03-07 DIAGNOSIS — D638 Anemia in other chronic diseases classified elsewhere: Secondary | ICD-10-CM

## 2017-03-07 LAB — RENAL FUNCTION PANEL
ALBUMIN: 1.3 g/dL — AB (ref 3.5–5.0)
ANION GAP: 12 (ref 5–15)
BUN: 43 mg/dL — ABNORMAL HIGH (ref 6–20)
CALCIUM: 7.5 mg/dL — AB (ref 8.9–10.3)
CO2: 23 mmol/L (ref 22–32)
Chloride: 95 mmol/L — ABNORMAL LOW (ref 101–111)
Creatinine, Ser: 4.71 mg/dL — ABNORMAL HIGH (ref 0.61–1.24)
GFR calc Af Amer: 13 mL/min — ABNORMAL LOW (ref >60)
GFR, EST NON AFRICAN AMERICAN: 11 mL/min — AB (ref >60)
GLUCOSE: 383 mg/dL — AB (ref 65–99)
PHOSPHORUS: 3.7 mg/dL (ref 2.5–4.6)
Potassium: 3.6 mmol/L (ref 3.5–5.1)
SODIUM: 130 mmol/L — AB (ref 135–145)

## 2017-03-07 LAB — GLUCOSE, CAPILLARY
GLUCOSE-CAPILLARY: 250 mg/dL — AB (ref 65–99)
Glucose-Capillary: 399 mg/dL — ABNORMAL HIGH (ref 65–99)
Glucose-Capillary: 313 mg/dL — ABNORMAL HIGH (ref 65–99)
Glucose-Capillary: 192 mg/dL — ABNORMAL HIGH (ref 65–99)

## 2017-03-07 MED ORDER — ONDANSETRON HCL 4 MG PO TABS: 4.0000 mg | ORAL_TABLET | Freq: Three times a day (TID) | ORAL | Status: DC | PRN

## 2017-03-07 NOTE — Evaluation (Signed)
Occupational Therapy Assessment and Plan  Patient Details  Name: [name] MRN: [id number] Date of Birth: March 12, 1941  OT Diagnosis: muscle weakness (generalized) Rehab Potential: Rehab Potential (ACUTE ONLY): Good ELOS: 12-14 days   Today's Date: 03/07/2017 OT Individual Time: 1110-1205 OT Individual Time Calculation (min): 55 min     Problem List:  Patient Active Problem List   Diagnosis Date Noted  . Essential hypertension   . Acute combined systolic and diastolic congestive heart failure ([place])   . Poorly controlled type 2 diabetes mellitus with peripheral neuropathy ([place])   . S/P bilateral below knee amputation ([place]) 03/06/2017  . Constipation due to pain medication   . Coronary artery disease involving native coronary artery of native heart without angina pectoris   . Hyperlipidemia   . S/P unilateral BKA (below knee amputation), right ([place])   . SIRS (systemic inflammatory response syndrome) ([place]) 03/05/2017  . S/P bilateral BKA (below knee amputation) ([place]) 03/01/2017  . Gangrene of right foot ([place]) 02/23/2017  . Blood in stool   . Wound dehiscence   . Rupture of operation wound   . Toe amputation status, right ([place])   . Pressure injury of head, stage 3 ([place])   . Poor nutrition   . 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 Adjustment disorder with depressed mood   . Hypoglycemia   . Ischemic pain of right foot   . Arterial hypotension   . Diabetes mellitus type 2 in obese ([place])   . PVD (peripheral vascular disease) ([place])   . Delirium   . History of left below knee amputation ([place]) 01/17/2017  . Acute blood loss anemia   . Elevated AST (SGOT) 01/09/2017  . Chest pain 01/09/2017  . NSTEMI (non-ST elevated myocardial infarction) ([place]) 01/09/2017  . Hypokalemia 01/07/2017  . Hypomagnesemia 01/07/2017  . Left foot infection 01/06/2017  . Chronic hypotension 01/06/2017  . 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 IDDM (insulin dependent diabetes  mellitus) ([place]) 01/06/2017  . Foot infection 01/06/2017  . Labile blood pressure   . Hallucination   . Benign essential HTN   . Ulcer of toe of right foot ([place])   . Ulcer of external ear, limited to breakdown of skin ([place])   . Chronic congestive heart failure ([place])   . Anemia of chronic disease   . Labile blood glucose   . Uncontrolled diabetes mellitus type 2 with peripheral artery disease ([place])   . Diabetic peripheral neuropathy ([place])   . Debility 12/27/2016  . History of transmetatarsal amputation of left foot ([place]) 12/27/2016  . Constipation   . Chronic obstructive pulmonary disease ([place])   . Nausea   . ESRD on dialysis ([place])   . Leukocytosis   . Tachypnea   . FUO (fever of unknown origin)   . Encephalopathy 12/23/2016  . Sore throat 12/23/2016  . Gangrene of left foot ([place]) 11/26/2016  . PAD (peripheral artery disease) ([place]) 11/26/2016  . Sjogren's syndrome ([place]) 09/26/2015  . Uncontrolled type 2 diabetes mellitus with hyperglycemia, with long-term current use of insulin ([place]) 09/26/2015  . Hyperosmolar non-ketotic state in patient with type 2 diabetes mellitus ([place]) 09/25/2015  . Ileus ([place]) 09/22/2015  . Acute respiratory failure with hypoxia ([place]) 09/22/2015  . Acute gouty arthritis 09/22/2015  . CAD (coronary artery disease) of artery bypass graft 09/22/2015  . Sepsis ([place]) 09/21/2015  . Elevated troponin level 09/21/2015  . ESRD on peritoneal dialysis ([place]) 09/21/2015  . CVA (cerebral infarction) 09/21/2015  Past Medical History:  Past Medical History:  Diagnosis Date  . AICD (automatic cardioverter/defibrillator) present    [employer] [phone] lead [id number]  . CHF (congestive heart failure) ([place])   . Complication of anesthesia   . COPD (chronic obstructive pulmonary disease) ([place])   . Coronary artery disease    CABG 2011  . Diabetes mellitus without complication ([place])   . ESRD on peritoneal dialysis [facility])    Started PD Dec 2016 in [place]. Nephrology is in [place].   . Gangrene ([place])    left knee  . GERD (gastroesophageal reflux disease)   . History of peritonitis    PD cath related peritonitis in April 2017  . Hypertension   . Peripheral vascular disease ([place])   . PONV (postoperative nausea and vomiting)   . Sjogren's syndrome ([place])    Per pt's wife, was diagnosed in 2016 during w/u for cause of renal failure prior to starting dialysis.  He had a rash on his chest apparently prompting this w/u.  Never had renal bx.  He was referred to a rheum MD in [place] and per the wife he was treated with MTX and other medications but had side effects to "all of it" and isn't taking anything for this as of Jun 2017.   [name] Sleep apnea    wears Bipap  . Stroke ([place])   . Wound dehiscence    Right foot   Past Surgical History:  Past Surgical History:  Procedure Laterality Date  . ABDOMINAL AORTOGRAM W/LOWER EXTREMITY N/A 11/29/2016   Procedure: ABDOMINAL AORTOGRAM W/LOWER EXTREMITY;  Surgeon: Serafina Mitchell, MD;  Location: [place] CV LAB;  Service: Cardiovascular;  Laterality: N/A;  . AMPUTATION Left 11/29/2016   Procedure: LEFT THIRD TOE AMPUTATION;  Surgeon: Serafina Mitchell, MD;  [facility];  Service: Vascular;  Laterality: Left;  . AMPUTATION Left 12/23/2016   Procedure: Left Transmetatarsal Amputation;  Surgeon: Newt Minion, MD;  Location: [place];  Service: Orthopedics;  Laterality: Left;  . AMPUTATION Left 01/11/2017   Procedure: LEFT BELOW KNEE AMPUTATION;  Surgeon: Newt Minion, MD;  Location: [place];  Service: Orthopedics;  Laterality: Left;  . AMPUTATION Right 01/20/2017   Procedure: AMPUTATION RIGHT FIFTH TOE;  Surgeon: Serafina Mitchell, MD;  Location: [place];  Service: Vascular;  Laterality: Right;  . AMPUTATION Right 03/01/2017   Procedure: RIGHT BELOW KNEE AMPUTATION;  Surgeon: Newt Minion, MD;  Location: [place];  Service: Orthopedics;  Laterality: Right;  . ANGIOPLASTY  12/15/2016   Procedure: BALLOON  ANGIOPLASTY;  Surgeon: Waynetta Sandy, MD;  Location: [place];  Service: Vascular;;  . cardiac catherization with stent placement  2017  . COLONOSCOPY W/ BIOPSIES AND POLYPECTOMY    . CORONARY ARTERY BYPASS GRAFT  2011  . FALSE ANEURYSM REPAIR Right 12/15/2016   Procedure: REPAIR FALSE ANEURYSM;  Surgeon: Waynetta Sandy, MD;  Location: [place];  Service: Vascular;  Laterality: Right;  . IRRIGATION AND DEBRIDEMENT FOOT Left 12/15/2016   Procedure: IRRIGATION AND DEBRIDEMENT FOOT;  Surgeon: Waynetta Sandy, MD;  Location: [place];  Service: Vascular;  Laterality: Left;  . LEFT HEART CATH AND CORS/GRAFTS ANGIOGRAPHY N/A 01/09/2017   Procedure: LEFT HEART CATH AND CORS/GRAFTS ANGIOGRAPHY;  Surgeon: Lorretta Harp, MD;  Location: [place] CV LAB;  Service: Cardiovascular;  Laterality: N/A;  . LOWER EXTREMITY ANGIOGRAM Left 12/15/2016   Procedure: LOWER EXTREMITY ANGIOGRAM; PEDAL ACCESS;  Surgeon: Waynetta Sandy,  MD;  Location: [place];  Service: Vascular;  Laterality: Left;  . PERIPHERAL VASCULAR BALLOON ANGIOPLASTY  11/29/2016   Procedure: PERIPHERAL VASCULAR BALLOON ANGIOPLASTY;  Surgeon: Serafina Mitchell, MD;  Location: [place] CV LAB;  Service: Cardiovascular;;  LT Peroneal AT attempted unsuccessful  . STUMP REVISION Left 03/01/2017   Procedure: REVISION LEFT BELOW KNEE AMPUTATION;  Surgeon: Newt Minion, MD;  Location: [place];  Service: Orthopedics;  Laterality: Left;    Assessment & Plan Clinical Impression: CALLIN ASHE is a 76 y.o. 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 History taken from chart review, patient, and wife. Presented on 03/01/17 with progressive ischemic  changes of the right foot as well as gangrenous changes along the previous left BKA. 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 Pt has been limited by pain, surgeries, and vacs. Wound vacs were later discontinued 03/05/2017 per Dr. Sharol Given. CCPD followed by renal services. Troponin mildly elevated 0.17 felt to be demand ischemia and monitored. Acute on chronic anemia and monitored. Bouts of hypokalemia with supplement added. Patient with low-grade fever and presently maintained on Zosyn with blood cultures negative and urine study pending. PM&R was asked to see the patient regarding potential for inpatient rehab admission. Patient was admitted for a comprehensive rehabilitation program.  Patient transferred to CIR on 03/06/2017 .    Patient currently requires max with basic self-care skills secondary to muscle weakness, decreased cardiorespiratoy endurance and decreased sitting balance and decreased postural control.  Prior to hospitalization, patient could complete BADLs with min.  Patient will benefit from skilled intervention to increase independence with basic self-care skills prior to discharge home with care partner.  Anticipate patient will require minimal physical assistance and follow up home health.  OT - End of Session Endurance Deficit: Yes Endurance Deficit Description: fatigues quickly and moderate increase in work of breathing w/ most mobility OT Assessment Rehab Potential (ACUTE ONLY): Good OT Patient demonstrates impairments in the following area(s): Balance;Endurance;Motor;Skin Integrity;Safety OT Basic ADL's Functional Problem(s): Bathing;Dressing;Toileting OT Transfers Functional Problem(s): Toilet OT Additional Impairment(s): None OT Plan OT Intensity: Minimum of 1-2 x/day, 45 to 90 minutes OT Frequency: 5 out of 7 days OT Duration/Estimated Length of Stay: 12-14 days OT Treatment/Interventions: Balance/vestibular  training;Discharge planning;Disease Lawyer;Functional mobility training;Pain management;Patient/family education;Psychosocial support;Self Care/advanced ADL retraining;Skin care/wound managment;Therapeutic Activities;Therapeutic Exercise;UE/LE Strength taining/ROM;Wheelchair propulsion/positioning OT Self Feeding Anticipated Outcome(s): no goal OT Basic Self-Care Anticipated Outcome(s): min A LB self care OT Toileting Anticipated Outcome(s): min A OT Bathroom Transfers Anticipated Outcome(s): min A OT Recommendation Patient destination: Home Follow Up Recommendations: Home health OT Equipment Recommended: None recommended by OT   Skilled Therapeutic Intervention Pt seen for initial evaluation and ADL training. Pt's wife in room. Pt and wife are very familiar with rehab process, OT purpose, goals as he was a patient here last month.  Pt had a bowl accident in his clothing due to an enema.  A significant amount of time was needed to cleanse pt as the bowel had spread all over his legs.  Pt worked on rolling in bed with mod A numerous times working on his core strength and hip strength  with lifting limbs.  Donned a brief to protect clothing in case of future incontinence.  His ACE wraps needed to be replaced so educated spouse on wrapping techniques.  LB bathed and pt worked on pants with max A to don over limbs and then he was able to pull over hips with extra time. Pt adjusted in bed to upright sitting.  A wide drop arm BSC obtained for pt to use.    Reviewed OT goals, estimated LOS and POC with pt and family.    OT Evaluation Precautions/Restrictions   NWB on B limbs, falls  Pain Pain Assessment Pain Assessment: No/denies pain Home Living/Prior Functioning Home Living Family/patient expects to be discharged to:: Private residence Living Arrangements: Spouse/significant other Available Help at Discharge: Family, Available 24 hours/day Type of  Home: Mobile home Home Access: Ramped entrance Home Layout: One level Bathroom Shower/Tub: Gaffer, Charity fundraiser: Standard  Lives With: Spouse Prior Function Level of Independence: Independent with transfers, Needs assistance with tranfers, Needs assistance with ADLs, Needs assistance with homemaking, Requires assistive device for independence(Independent w/ some transfers, needs assistance for uneven slide board transfers)  Able to Take Stairs?: No Driving: No Vocation: Retired ADL  refer to functional navigator Vision Baseline Vision/History: Wears glasses Wears Glasses: At all times Patient Visual Report: No change from baseline Vision Assessment?: No apparent visual deficits Perception  Perception: Within Functional Limits Praxis Praxis: Intact Cognition Overall Cognitive Status: Within Functional Limits for tasks assessed Arousal/Alertness: Awake/alert Memory: Appears intact Attention: Focused Focused Attention: Appears intact Sustained Attention: Appears intact Awareness: Appears intact Problem Solving: Appears intact Safety/Judgment: Appears intact Sensation Sensation Light Touch: Impaired by gross assessment Light Touch Impaired Details: Impaired RLE;Impaired LLE(impaired R>L along residual limb) Proprioception: Appears Intact Coordination Gross Motor Movements are Fluid and Coordinated: Yes Fine Motor Movements are Fluid and Coordinated: Yes Motor  Motor Motor: Other (comment) Motor - Skilled Clinical Observations: generalized weakness, bilateral BKAs Mobility  Bed Mobility Bed Mobility: Rolling Right;Rolling Left;Supine to Sit;Sit to Supine Rolling Right: 3: Mod assist Rolling Right Details: Manual facilitation for weight shifting;Manual facilitation for placement;Manual facilitation for weight bearing;Verbal cues for technique Rolling Left: 3: Mod assist Rolling Left Details: Verbal cues for technique;Manual facilitation for weight  shifting;Manual facilitation for placement;Manual facilitation for weight bearing Supine to Sit: 3: Mod assist Supine to Sit Details: Manual facilitation for placement;Manual facilitation for weight shifting;Manual facilitation for weight bearing;Verbal cues for technique Sit to Supine: 3: Mod assist Sit to Supine - Details: Manual facilitation for weight bearing;Manual facilitation for weight shifting;Manual facilitation for placement;Verbal cues for technique  Trunk/Postural Assessment  Cervical Assessment Cervical Assessment: Exceptions to WFL(forward head, rounded shoulder posture) Thoracic Assessment Thoracic Assessment: Within Functional Limits Lumbar Assessment Lumbar Assessment: Exceptions to WFL(posterior pelvic tilt) Postural Control Postural Control: Deficits on evaluation(posterior lean in sitting)  Balance Balance Balance Assessed: Yes Static Sitting Balance Static Sitting - Balance Support: Right upper extremity supported Static Sitting - Level of Assistance: 4: Min assist Dynamic Sitting Balance Dynamic Sitting - Balance Support: During functional activity;No upper extremity supported Dynamic Sitting - Level of Assistance: 3: Mod assist Sitting balance - Comments: posterior lean, increased difficulty returning to upright Extremity/Trunk Assessment RUE Assessment RUE Assessment: Within Functional Limits  - 4/ 5 BUE LUE Assessment LUE Assessment: Within Functional Limits   See Function Navigator for Current Functional Status.   Refer to Care Plan for Long Term Goals  Recommendations for other services: None    Discharge Criteria: Patient will be discharged  from OT if patient refuses treatment 3 consecutive times without medical reason, if treatment goals not met, if there is a change in medical status, if patient makes no progress towards goals or if patient is discharged from hospital.  The above assessment, treatment plan, treatment alternatives and goals were  discussed and mutually agreed upon: by patient and by family  Feliciano Wynter 03/07/2017, 1:11 PM

## 2017-03-07 NOTE — Progress Notes (Signed)
PMR Admission Coordinator Pre-Admission Assessment  Patient: [name] is an 76 y.o., male MRN: [id number] DOB: Jul 04, 1940 Height: 5\' 9"  (175.3 cm) Weight: 102.3 kg (225 lb 8.5 oz)              Insurance Information HMO: No    PPO:       PCP:       IPA:       80/20:       OTHER:   PRIMARY:  Medicare A and B      Policy#: [id number]      Subscriber: patient CM Name:        Phone#:       Fax#:   Pre-Cert#:        Employer:  Retired Benefits:  Phone #:       Name: Checked in Passport one source [employer]. Date: 02/09/06     Deduct: $1340      Out of Pocket Max: none      Life Max: N/A CIR: 100%      SNF: 100 days Outpatient: 80%     Co-Pay: 20% Home Health: 100%      Co-Pay: none DME: 80%     Co-Pay: 20% Providers: patient's choice  SECONDARY: BCBS      Policy#: [id number]      Subscriber: patient CM Name:        Phone#:       Fax#:   Pre-Cert#:        Employer:  Retired Benefits:  Phone #: [phone]      Name:   Eff. Date:       Deduct:        Out of Pocket Max:        Life Max:   CIR:        SNF:   Outpatient:       Co-Pay:   Home Health:        Co-Pay:   DME:       Co-Pay:    Emergency Contact Information Contact Information    Name Relation Home Work Mobile   [name] Spouse [phone]  [phone]     Current Medical History  Patient Admitting Diagnosis:  New L BKA, old R BKA  History of Present Illness: A 76 y.o. 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 Pt has been limited by pain, surgeries, and vacs. PM&R was asked to see the patient regarding potential for inpatient rehab admission.   Past Medical History  Past Medical History:  Diagnosis Date  . AICD (automatic  cardioverter/defibrillator) present    [employer] [phone] lead [id number]  . CHF (congestive heart failure) (HCC)   . Complication of anesthesia   . COPD (chronic obstructive pulmonary disease) (HCC)   . Coronary artery disease    CABG 2011  . Diabetes mellitus without complication (HCC)   . ESRD on peritoneal dialysis [facility])    Started PD Dec 2016 in [place]. Nephrology is in [place].   . Gangrene (HCC)    left knee  . GERD (gastroesophageal reflux disease)   . History of peritonitis    PD cath  related peritonitis in April 2017  . Hypertension   . Peripheral vascular disease (HCC)   . PONV (postoperative nausea and vomiting)   . Sjogren's syndrome (HCC)    Per pt's wife, was diagnosed in 2016 during w/u for cause of renal failure prior to starting dialysis.  He had a rash on his chest apparently prompting this w/u.  Never had renal bx.  He was referred to [place]um [place] in [place] and per the wife he was treated with MTX and other medications but had side effects to "all of it" and isn't taking anything for this as of Jun 2017.   [name]. Sleep apnea    wears Bipap  . Stroke (HCC)   . Wound dehiscence    Right foot    Family History  family history includes Diabetes in his mother; Heart disease in his father.  Prior Rehab/Hospitalizations: Was recently on CIR 01/17/17 and 12/23/16.  Has the patient had major surgery during 100 days prior to admission? Yes  Current Medications   Current Facility-Administered Medications:  .  acetaminophen (TYLENOL) tablet 650 mg, 650 mg, Oral, Q4H PRN **OR** acetaminophen (TYLENOL) suppository 650 mg, 650 mg, Rectal, Q4H PRN, Angiulli, Mcarthur Rossettianiel J, PA-C .  aspirin EC tablet 81 mg, 81 mg, Oral, Daily, Charlton Amorngiulli, Daniel J, PA-C, 81 mg at 03/07/17 0753 .  atorvastatin (LIPITOR) tablet 80 mg, 80 mg, Oral, q1800, Angiulli, Mcarthur Rossettianiel J, PA-C .  bisacodyl (DULCOLAX) suppository 10 mg, 10 mg, Rectal, Daily PRN, Angiulli, Mcarthur Rossettianiel J, PA-C .  carvedilol  (COREG) tablet 6.25 mg, 6.25 mg, Oral, QHS, Angiulli, Mcarthur RossettiDaniel J, PA-C, 6.25 mg at 03/06/17 2119 .  clopidogrel (PLAVIX) tablet 75 mg, 75 mg, Oral, Daily, Charlton Amorngiulli, Daniel J, PA-C, 75 mg at 03/07/17 0753 .  [START ON 03/10/2017] Darbepoetin Alfa (ARANESP) injection 150 mcg, 150 mcg, Subcutaneous, Q Fri-1800, Angiulli, Mcarthur Rossettianiel J, PA-C .  dialysis solution 1.5% low-MG/low-CA dianeal solution, , Intraperitoneal, Q24H, Angiulli, Mcarthur Rossettianiel J, PA-C .  dialysis solution 1.5% low-MG/low-CA dianeal solution, , Intraperitoneal, Q24H, Angiulli, Mcarthur Rossettianiel J, PA-C .  dialysis solution 2.5% low-MG/low-CA dianeal solution, , Intraperitoneal, Q24H, Angiulli, Mcarthur RossettiDaniel J, PA-C .  docusate sodium (COLACE) capsule 100 mg, 100 mg, Oral, BID, Angiulli, Mcarthur RossettiDaniel J, PA-C, 100 mg at 03/07/17 0753 .  feeding supplement (NEPRO CARB STEADY) liquid 237 mL, 237 mL, Oral, BID BM, Angiulli, Mcarthur Rossettianiel J, PA-C .  feeding supplement (PRO-STAT SUGAR FREE 64) liquid 30 mL, 30 mL, Oral, BID, Angiulli, Mcarthur RossettiDaniel J, PA-C, 30 mL at 03/07/17 0754 .  heparin 1,500 Units in dialysis solution 1.5% low-MG/low-CA 3,000 mL dialysis solution, , Peritoneal Dialysis, PRN, Angiulli, Mcarthur Rossettianiel J, PA-C .  heparin 2,500 Units in dialysis solution 1.5% low-MG/low-CA 5,000 mL dialysis solution, , Peritoneal Dialysis, PRN, Angiulli, Mcarthur Rossettianiel J, PA-C .  heparin 2,500 Units in dialysis solution 2.5% low-MG/low-CA 5,000 mL dialysis solution, , Peritoneal Dialysis, PRN, Angiulli, Mcarthur Rossettianiel J, PA-C .  insulin aspart (novoLOG) injection 0-9 Units, 0-9 Units, Subcutaneous, TID WC, Angiulli, Mcarthur RossettiDaniel J, PA-C, 9 Units at 03/07/17 0753 .  insulin aspart (novoLOG) injection 3 Units, 3 Units, Subcutaneous, TID WC, Angiulli, Mcarthur RossettiDaniel J, PA-C, 3 Units at 03/07/17 0754 .  insulin glargine (LANTUS) injection 10 Units, 10 Units, Subcutaneous, QHS, Charlton Amorngiulli, Daniel J, PA-C, 10 Units at 03/06/17 2125 .  linezolid (ZYVOX) IVPB 600 mg, 600 mg, Intravenous, Q12H, Angiulli, Mcarthur RossettiDaniel J, PA-C, Stopped at 03/07/17  0845 .  multivitamin (RENA-VIT) tablet 1 tablet, 1 tablet, Oral, QHS, Angiulli, Mcarthur RossettiDaniel J, PA-C, 1 tablet at 03/06/17  2119 .  nitroGLYCERIN (NITROSTAT) SL tablet 0.4 mg, 0.4 mg, Sublingual, Q5 min PRN, Angiulli, Mcarthur Rossetti, PA-C .  oxyCODONE (Oxy IR/ROXICODONE) immediate release tablet 10 mg, 10 mg, Oral, Q3H PRN, Angiulli, Mcarthur Rossetti, PA-C .  piperacillin-tazobactam (ZOSYN) IVPB 2.25 g, 2.25 g, Intravenous, Q8H, Angiulli, Mcarthur Rossetti, PA-C, Stopped at 03/07/17 [phone] .  polyethylene glycol (MIRALAX / GLYCOLAX) packet 17 g, 17 g, Oral, Daily PRN, Angiulli, Mcarthur Rossetti, PA-C .  polyethylene glycol (MIRALAX / GLYCOLAX) packet 17 g, 17 g, Oral, Daily, Angiulli, Daniel J, PA-C .  sorbitol 70 % solution 30 mL, 30 mL, Oral, QID PRN, Angiulli, Mcarthur Rossetti, PA-C  Patients Current Diet: Diet Carb Modified Fluid consistency: Thin; Room service appropriate? Yes  Precautions / Restrictions Restrictions Weight Bearing Restrictions: Yes RLE Weight Bearing: Non weight bearing LLE Weight Bearing: Non weight bearing   Has the patient had 2 or more falls or a fall with injury in the past year?No  Prior Activity Level    Journalist, newspaper / Equipment Home Assistive Devices/Equipment: Wheelchair, CBG Meter, Eyeglasses  Prior Device Use: Indicate devices/aids used by the patient prior to current illness, exacerbation or injury? Manual wheelchair  Prior Functional Level    Self Care: Did the patient need help bathing, dressing, using the toilet or eating?  Needed some help  Indoor Mobility: Did the patient need assistance with walking from room to room (with or without device)? Independent  Stairs: Did the patient need assistance with internal or external stairs (with or without device)? Needed some help  Functional Cognition: Did the patient need help planning regular tasks such as shopping or remembering to take medications? Independent  Current Functional Level Cognition  Orientation Level: Oriented to  person, Oriented to place, Oriented to situation, Disoriented to time    Extremity Assessment (includes Sensation/Coordination)          ADLs       Mobility       Transfers       Ambulation / Gait / Stairs / Wheelchair Mobility       Posture / Balance      Special needs/care consideration BiPAP/CPAP Yes, Bipap CPM No Continuous Drip IV No Dialysis Yes, PD nightly        Life Vest No, has an ICD/defibrillator in place Oxygen No Special Bed No Trach Size No Wound Vac (area) No      Skin Dressings intact to old R BKA and new L BKA sites                              Bowel mgmt: Last BM 02/28/17 Bladder mgmt: Oliguria Diabetic mgmt Yes, on insulin at home    Previous Home Environment Living Arrangements: Spouse/significant other Home Care Services: No  Discharge Living Setting Does the patient have any problems obtaining your medications?: No  Social/Family/Support Systems    Goals/Additional Needs    Decrease burden of Care through IP rehab admission: N/A  Possible need for SNF placement upon discharge: Not planned  Patient Condition: This patient's medical and functional status has changed since the consult dated: 03/04/17 in which the Rehabilitation Physician determined and documented that the patient's condition is appropriate for intensive rehabilitative care in an inpatient rehabilitation facility. See "History of Present Illness" (above) for medical update. Functional changes are:  Currently requiring mod assist +2 for anterior/posterior transfers. Patient's medical and functional status update has been discussed with the Rehabilitation  physician and patient remains appropriate for inpatient rehabilitation. Will admit to inpatient rehab today.  Preadmission Screen Completed By:  Trish MageLogue, Kawon Willcutt M, 03/07/2017 8:53 AM ______________________________________________________________________   Discussed status with Dr. Allena Katzpatel on 03/06/17 at 1529 and received  telephone approval for admission today.  Admission Coordinator:  Trish MageLogue, Mekhai Venuto M, time 1529/Date 03/06/17

## 2017-03-07 NOTE — Progress Notes (Signed)
Social Work Assessment and Plan Social Work Assessment and Plan  Patient Details  Name: [name] MRN: [id number] Date of Birth: 17-Mar-1941  Today's Date: 03/07/2017  Problem List:  Patient Active Problem List   Diagnosis Date Noted  . Essential hypertension   . Acute combined systolic and diastolic congestive heart failure (HCC)   . Poorly controlled type 2 diabetes mellitus with peripheral neuropathy (HCC)   . S/P bilateral below knee amputation (HCC) 03/06/2017  . Constipation due to pain medication   . Coronary artery disease involving native coronary artery of native heart without angina pectoris   . Hyperlipidemia   . S/P unilateral BKA (below knee amputation), right (HCC)   . SIRS (systemic inflammatory response syndrome) (HCC) 03/05/2017  . S/P bilateral BKA (below knee amputation) (HCC) 03/01/2017  . Gangrene of right foot (HCC) 02/23/2017  . Blood in stool   . Wound dehiscence   . Rupture of operation wound   . Toe amputation status, right (HCC)   . Pressure injury of head, stage 3 (HCC)   . Poor nutrition   . Brittle diabetes (HCC)   . Adjustment disorder with depressed mood   . Hypoglycemia   . Ischemic pain of right foot   . Arterial hypotension   . Diabetes mellitus type 2 in obese (HCC)   . PVD (peripheral vascular disease) (HCC)   . Delirium   . History of left below knee amputation (HCC) 01/17/2017  . Acute blood loss anemia   . Elevated AST (SGOT) 01/09/2017  . Chest pain 01/09/2017  . NSTEMI (non-ST elevated myocardial infarction) (HCC) 01/09/2017  . Hypokalemia 01/07/2017  . Hypomagnesemia 01/07/2017  . Left foot infection 01/06/2017  . Chronic hypotension 01/06/2017  . 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 IDDM (insulin dependent diabetes mellitus) (HCC) 01/06/2017  . Foot infection 01/06/2017  . Labile blood pressure   . Hallucination   . Benign essential HTN   . Ulcer of toe of right foot (HCC)   . Ulcer  of external ear, limited to breakdown of skin (HCC)   . Chronic congestive heart failure (HCC)   . Anemia of chronic disease   . Labile blood glucose   . 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 Diabetic peripheral neuropathy (HCC)   . Debility 12/27/2016  . History of transmetatarsal amputation of left foot (HCC) 12/27/2016  . Constipation   . Chronic obstructive pulmonary disease (HCC)   . Nausea   . ESRD on dialysis (HCC)   . Leukocytosis   . Tachypnea   . FUO (fever of unknown origin)   . Encephalopathy 12/23/2016  . Sore throat 12/23/2016  . Gangrene of left foot (HCC) 11/26/2016  . PAD (peripheral artery disease) (HCC) 11/26/2016  . Sjogren's syndrome (HCC) 09/26/2015  . Uncontrolled type 2 diabetes mellitus with hyperglycemia, with long-term current use of insulin (HCC) 09/26/2015  . Hyperosmolar non-ketotic state in patient with type 2 diabetes mellitus (HCC) 09/25/2015  . Ileus (HCC) 09/22/2015  . Acute respiratory failure with hypoxia (HCC) 09/22/2015  . Acute gouty arthritis 09/22/2015  . CAD (coronary artery disease) of artery bypass graft 09/22/2015  . Sepsis (HCC) 09/21/2015  . Elevated troponin level 09/21/2015  . ESRD on peritoneal dialysis (HCC) 09/21/2015  . CVA (cerebral infarction) 09/21/2015   Past Medical History:  Past Medical History:  Diagnosis Date  . AICD (automatic cardioverter/defibrillator) present    [employer] [phone] lead [id number]  .  CHF (congestive heart failure) (HCC)   . Complication of anesthesia   . COPD (chronic obstructive pulmonary disease) (HCC)   . Coronary artery disease    CABG 2011  . Diabetes mellitus without complication (HCC)   . ESRD on peritoneal dialysis [facility])    [place] PD Dec 2016 in [place]. [place] in [place].   . Gangrene (HCC)    left knee  . GERD (gastroesophageal reflux disease)   . History of peritonitis    PD cath related peritonitis in April  2017  . Hypertension   . Peripheral vascular disease (HCC)   . PONV (postoperative nausea and vomiting)   . Sjogren's syndrome (HCC)    Per pt's wife, was diagnosed in 2016 during w/u for cause of renal failure prior to starting dialysis.  He had a rash on his chest apparently prompting this w/u.  Never had renal bx.  He was [place] to [place]um [place] in [place] and per the wife he was treated with MTX and other medications but had side effects to "all of it" and isn't taking anything for this as of Jun 2017.   [name]. Sleep apnea    wears Bipap  . Stroke (HCC)   . Wound dehiscence    Right foot   Past Surgical History:  Past Surgical History:  Procedure Laterality Date  . ABDOMINAL AORTOGRAM W/LOWER EXTREMITY N/A 11/29/2016   Procedure: ABDOMINAL AORTOGRAM W/LOWER EXTREMITY;  Surgeon: Nada LibmanBrabham, Vance W, MD;  [facility];  Service: Cardiovascular;  Laterality: N/A;  . AMPUTATION Left 11/29/2016   Procedure: LEFT THIRD TOE AMPUTATION;  Surgeon: Nada LibmanBrabham, Vance W, MD;  [facility];  Service: Vascular;  Laterality: Left;  . AMPUTATION Left 12/23/2016   Procedure: Left Transmetatarsal Amputation;  Surgeon: [name], Marcus V, MD;  [facility];  Service: Orthopedics;  Laterality: Left;  . AMPUTATION Left 01/11/2017   Procedure: LEFT BELOW KNEE AMPUTATION;  Surgeon: [name], Marcus V, MD;  [facility];  Service: Orthopedics;  Laterality: Left;  . AMPUTATION Right 01/20/2017   Procedure: AMPUTATION RIGHT FIFTH TOE;  Surgeon: Nada LibmanBrabham, Vance W, MD;  [facility];  Service: Vascular;  Laterality: Right;  . AMPUTATION Right 03/01/2017   Procedure: RIGHT BELOW KNEE AMPUTATION;  Surgeon: [name], Marcus V, MD;  [facility];  Service: Orthopedics;  Laterality: Right;  . ANGIOPLASTY  12/15/2016   Procedure: BALLOON ANGIOPLASTY;  Surgeon: Maeola Harmanain, Brandon Christopher, MD;  [facility];  Service: Vascular;;  . cardiac catherization with stent placement  2017  . COLONOSCOPY W/ BIOPSIES AND  POLYPECTOMY    . CORONARY ARTERY BYPASS GRAFT  2011  . FALSE ANEURYSM REPAIR Right 12/15/2016   Procedure: REPAIR FALSE ANEURYSM;  Surgeon: Maeola Harmanain, Brandon Christopher, MD;  [facility];  Service: Vascular;  Laterality: Right;  . IRRIGATION AND DEBRIDEMENT FOOT Left 12/15/2016   Procedure: IRRIGATION AND DEBRIDEMENT FOOT;  Surgeon: Maeola Harmanain, Brandon Christopher, MD;  [facility];  Service: Vascular;  Laterality: Left;  . LEFT HEART CATH AND CORS/GRAFTS ANGIOGRAPHY N/A 01/09/2017   Procedure: LEFT HEART CATH AND CORS/GRAFTS ANGIOGRAPHY;  Surgeon: Runell GessBerry, Jonathan J, MD;  [facility];  Service: Cardiovascular;  Laterality: N/A;  . LOWER EXTREMITY ANGIOGRAM Left 12/15/2016   Procedure: LOWER EXTREMITY ANGIOGRAM; PEDAL ACCESS;  Surgeon: Maeola Harmanain, Brandon Christopher, MD;  [facility];  Service: Vascular;  Laterality: Left;  . PERIPHERAL VASCULAR BALLOON ANGIOPLASTY  11/29/2016   Procedure: PERIPHERAL VASCULAR BALLOON ANGIOPLASTY;  Surgeon: Myra GianottiBrabham,  Fran Lowes, MD;  [facility];  Service: Cardiovascular;;  LT Peroneal AT attempted unsuccessful  . STUMP REVISION Left 03/01/2017   Procedure: REVISION LEFT BELOW KNEE AMPUTATION;  Surgeon: Nadara Mustard, MD;  Location[facility];  Service: Orthopedics;  Laterality: Left;   Social History:  reports that he has quit smoking. His smoking use included pipe. he has never used smokeless tobacco. He reports that he does not drink alcohol or use drugs.  Family / Support Systems Marital Status: Married Patient Roles: Spouse, Parent Spouse/Significant Other: Burna Mortimer [phone] Children: Son and daughter Other Supports: Friends Anticipated Caregiver: Wife Ability/Limitations of Caregiver: Wife stays here with pt and has been his caregiver since his surgeries Caregiver Availability: 24/7 Family Dynamics: Close knit family who rely upon one another. Their children will assist when they can, main caregiver is his wife. Wife is hopeful  this is the last surgery he will have.  Social History Preferred language: English Religion: [employer] Cultural Background: No issues Education: [employer] Read: Yes Write: Yes Employment Status: Retired Fish farm manager Issues: No issues Guardian/Conservator: None-according to MD pt is capable of making his own decisions while here   Abuse/Neglect Abuse/Neglect Assessment Can Be Completed: Yes Physical Abuse: Denies Verbal Abuse: Denies Sexual Abuse: Denies Exploitation of patient/patient's resources: Denies Self-Neglect: Denies  Emotional Status Pt's affect, behavior adn adjustment status: Pt is trying to be positive and taking each day at a time. He is having pain issues but is trying to push forward to make progress while here. He wants to be as independent as possible before going home from here. Wife is very supportive and involved and will assist him Recent Psychosocial Issues: other surgeries and R-BKA. Multiple hospitalizations in the past few months Pyschiatric History: No issues-pt is very soft spoken and usually lets his wife talk for him. He has been thorugh a lot in the past few months and would benefit from seeing neuro-psych while here. Will make referral Substance Abuse History: No issues  Patient / Family Perceptions, Expectations & Goals Pt/Family understanding of illness & functional limitations: Pt and wife talk with the MD daily and feel they have a good understanding of his amputation and is hopeful he is done now and can recover and go forward. Wife is here daily and participates in his care. Premorbid pt/family roles/activities: Husband, father, retiree, friend, etc Anticipated changes in roles/activities/participation: Plans to resume Pt/family expectations/goals: Pt states: " I want to recover and be home and not have to come back here."  Wife states: " I will be here and learn what I need to learn, but hope it goes well."  [employer]: Other (Comment)([facility] was following him PTA) Premorbid Home Care/DME Agencies: Other (Comment)(has DME from previous admits) Transportation available at discharge: Wife Resource referrals recommended: Neuropsychology, Support group (specify)  Discharge Planning Living Arrangements: Spouse/significant other Support Systems: Spouse/significant other, Children, Friends/neighbors Type of Residence: Private residence Insurance Resources: [employer], Media planner (specify)(BCBS) Financial Resources: SSD, Family Support Financial Screen Referred: No Living Expenses: Own Money Management: Spouse, Patient Does the patient have any problems obtaining your medications?: No Home Management: Wife Patient/Family Preliminary Plans: Return home with wife who was prpviding care prior to admission. Now has had other leg amputated and he will need to learn to transfer and maneuver around. They have a loaner power chair still waiting for his. Await team's evaluations and work on a safe plan Social Work Anticipated Follow Up  Needs: HH/OP, Support Group  Clinical Impression Well known patient on rehab was recently here in Oct. Both he and wife were doing well at home prior to this surgery. Wife stays here with pt for support. Will await team evaluations and work on a safe plan. They have most equipment but may need a hospital bed upon discharge. Will await recommendations  Lucy Chris 03/07/2017, 10:52 AM

## 2017-03-07 NOTE — Progress Notes (Signed)
Physical Therapy Session Note  Patient Details  Name: Joshua [name] MRN: [id number] Date of Birth: 24-Dec-1940  Today's Date: 03/07/2017 PT Individual Time: 1500-1611 PT Individual Time Calculation (min): 71 min   Short Term Goals: Week 1:  PT Short Term Goal 1 (Week 1): Pt will perform bed mobility w/ Min A  PT Short Term Goal 2 (Week 1): Pt will transfer bed<>chair w/ Mod A  PT Short Term Goal 3 (Week 1): Pt will self-propel w/c 2275' w/ supervision PT Short Term Goal 4 (Week 1): Pt will maintain static sitting balance w/ supervision   Skilled Therapeutic Interventions/Progress Updates:   Pt supine in bed upon PT arrival, agreeable to therapy tx and reports pain 8/10 in B LEs. Pt transferred supine>sitting with max assist, once in sitting pt required max assist to prevent posterior truncal lean despite max verbal cues for anterior lean. Pt attempted to scoot towards edge of bed in order to perform A-P transfer but unable to push through B UEs enough to off weight/scoot hips. Worked on lateral leans on elbows in order to scoot each hip back, pt becoming frustrated. Therapist suggested trying slideboard transfer instead. Pt transferred to sitting EOB with max assist and transferred from bed>w/c with slideboard, lateral scoot and max assist, increased time to completed. Pt propelled w/c x 50 ft using B UEs, increased time to complete. Pt transferred from w/c<>car with max assist using the slideboard, verbal cues for technique and manual facilitation for appropriate weightshifts. Pt requesting to get back in bed at end of session, reports "I am way to tired." Pt transferred w/c>bed with max assist using slideboard, lateral scoots. Pt transferred from sitting>supine with max assist. Pt left supine in bed at end of session in care of wife, needs in reach.   Therapy Documentation Precautions:  Precautions Precautions: Fall Restrictions Weight Bearing Restrictions: Yes RLE Weight Bearing: Non  weight bearing RLE Partial Weight Bearing Percentage or Pounds: R BKA LLE Weight Bearing: Non weight bearing LLE Partial Weight Bearing Percentage or Pounds: L BKA   See Function Navigator for Current Functional Status.   Therapy/Group: Individual Therapy  Cresenciano GenreEmily van Schagen, PT, DPT 03/07/2017, 4:05 PM

## 2017-03-07 NOTE — Progress Notes (Signed)
Subjective:  No new c/o  Objective  Vital signs in last 24 hours: Vitals:   03/06/17 1839 03/06/17 1840 03/07/17 0508 03/07/17 1415  BP: 109/65 (!) 141/66 135/64 110/70  Pulse:  (!) 105 (!) 101 100  Resp:  20 20 19   Temp: 98.2 F (36.8 C) 98.5 F (36.9 C) (!) 97.5 F (36.4 C) 98.3 F (36.8 C)  TempSrc: Oral Oral Oral Oral  SpO2: 97% 98% 100% 99%  Weight:   102.3 kg (225 lb 8.5 oz)   Height: 5\' 9"  (1.753 m)      Weight change:   Physical Exam: General:  Alert eating brk with wife assisting , NAD  ,OX3, appropriate  Heart: RRR; No m, r, g Lungs: CTA  bilat. Abdomen: soft, non-tender., non distended,  PD cath in place. Extremities:  L BKA and new R BKA  Dressing dry/clean Dialysis Access: PD cath,  L AVF + thrill, bruit nmot developed well   Dialysis Orders: [facility]. 6 exchanges/day one mid-day exchange (occurs around 9am).  2.5 liters of 2.5% per [facility] 2 liters of 2.5% for last fill and mid day drain. Dr. Denny PeonBaveja    Assessment: 1. PAD sp revision L BKA and new R BKA on 11/21 2. [place] on [facility] in [place]. Low BP's, getting all 1.5% 3. Hypokalemia - better 4. HTN/ vol - coreg low dose w threshold, bp's soft 5. MBD of CKD - holding phoslo for low phos; Ca ok. No vit D 6. UTI - blood cx's neg, no urine cx in system. Sp short course abx 7. DM - per primary 8. Malnutrition - alb 1.7 range. Prostat, +Nepro.    Plan - cont CCPD every 24 hrs   [name] Aaliah Jorgenson MD [facility] pager [phone]   03/07/2017, 4:44 PM    Labs: Basic Metabolic Panel: Recent Labs  Lab 03/03/17 0654 03/04/17 2247 03/06/17 0205 03/07/17 0555  NA 132* 130*  --  130*  K 3.5 2.5* 3.8 3.6  CL 93* 97*  --  95*  CO2 29 25  --  23  GLUCOSE 350* 341*  --  383*  BUN 33* 37*  --  43*  CREATININE 5.55* 4.73*  --  4.71*  CALCIUM 8.4* 7.3*  --  7.5*  PHOS 3.6  --   --  3.7   Liver Function Tests: Recent Labs  Lab 03/01/17 1006 03/03/17 0654  03/07/17 0555  AST 38  --   --   ALT 18  --   --   ALKPHOS 86  --   --   BILITOT 0.9  --   --   PROT 5.7*  --   --   ALBUMIN 1.8* 1.6* 1.3*   CBC: Recent Labs  Lab 03/01/17 1006 03/03/17 0654 03/04/17 2247 03/06/17 0205  WBC 10.8* 10.6* 12.5* 12.5*  HGB 11.1* 9.0* 8.1* 8.1*  HCT 34.4* 28.4* 25.3* 25.6*  MCV 93.5 95.0 91.7 94.8  PLT 367 313 287 300   Cardiac Enzymes: Recent Labs  Lab 03/05/17 0045 03/05/17 0953  TROPONINI 0.17* 0.15*   CBG: Recent Labs  Lab 03/06/17 1226 03/06/17 1712 03/06/17 2108 03/07/17 0632 03/07/17 1211  GLUCAP 423* 295* 297* 399* 313*    Studies/Results: No results found. Medications: . dialysis solution 1.5% low-MG/low-CA    . dialysis solution 1.5% low-MG/low-CA    . dialysis solution 2.5% low-MG/low-CA     . aspirin EC  81 mg Oral Daily  . atorvastatin  80 mg Oral  q1800  . carvedilol  6.25 mg Oral QHS  . clopidogrel  75 mg Oral Daily  . [START ON 03/10/2017] darbepoetin (ARANESP) injection - NON-DIALYSIS  150 mcg Subcutaneous Q Fri-1800  . docusate sodium  100 mg Oral BID  . feeding supplement (NEPRO CARB STEADY)  237 mL Oral BID BM  . feeding supplement (PRO-STAT SUGAR FREE 64)  30 mL Oral BID  . insulin aspart  0-9 Units Subcutaneous TID WC  . insulin aspart  3 Units Subcutaneous TID WC  . insulin glargine  10 Units Subcutaneous QHS  . multivitamin  1 tablet Oral QHS  . polyethylene glycol  17 g Oral Daily

## 2017-03-07 NOTE — Plan of Care (Signed)
  RH BOWEL ELIMINATION RH STG MANAGE BOWEL WITH ASSISTANCE Description STG Manage Bowel with Mod I Assistance.  03/07/2017 2147 - Progressing by Pollyann SavoyLloyd, Aaleah Hirsch D, RN   [facility] SK[facility] RH STG SKIN FREE OF INFECTION/BREAKDOWN Description No new breakdown/infection with mod assist   03/07/2017 2147 - Progressing by Pollyann SavoyLloyd, Neyland Pettengill D, RN   RH PAIN MANAGEMENT RH STG PAIN MANAGED AT OR BELOW PT'S PAIN GOAL Description <4 out of 10.   03/07/2017 2147 - Progressing by Pollyann SavoyLloyd, Alena Blankenbeckler D, RN

## 2017-03-07 NOTE — Progress Notes (Signed)
Physical Therapy Assessment and Plan  Patient Details  Name: [name] MRN: [id number] Date of Birth: 23-Aug-1940  PT Diagnosis: Impaired sensation and Muscle weakness Rehab Potential: Good ELOS: 10-14 days   Today's Date: 03/07/2017 PT Individual Time: 0900-1000 PT Individual Time Calculation (min): 60 min    Problem List:  Patient Active Problem List   Diagnosis Date Noted  . Essential hypertension   . Acute combined systolic and diastolic congestive heart failure ([place])   . Poorly controlled type 2 diabetes mellitus with peripheral neuropathy ([place])   . S/P bilateral below knee amputation ([place]) 03/06/2017  . Constipation due to pain medication   . Coronary artery disease involving native coronary artery of native heart without angina pectoris   . Hyperlipidemia   . S/P unilateral BKA (below knee amputation), right ([place])   . SIRS (systemic inflammatory response syndrome) ([place]) 03/05/2017  . S/P bilateral BKA (below knee amputation) ([place]) 03/01/2017  . Gangrene of right foot ([place]) 02/23/2017  . Blood in stool   . Wound dehiscence   . Rupture of operation wound   . Toe amputation status, right ([place])   . Pressure injury of head, stage 3 ([place])   . Poor nutrition   . 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 Adjustment disorder with depressed mood   . Hypoglycemia   . Ischemic pain of right foot   . Arterial hypotension   . Diabetes mellitus type 2 in obese ([place])   . PVD (peripheral vascular disease) ([place])   . Delirium   . History of left below knee amputation ([place]) 01/17/2017  . Acute blood loss anemia   . Elevated AST (SGOT) 01/09/2017  . Chest pain 01/09/2017  . NSTEMI (non-ST elevated myocardial infarction) ([place]) 01/09/2017  . Hypokalemia 01/07/2017  . Hypomagnesemia 01/07/2017  . Left foot infection 01/06/2017  . Chronic hypotension 01/06/2017  . 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 IDDM (insulin dependent diabetes mellitus) ([place]) 01/06/2017   . Foot infection 01/06/2017  . Labile blood pressure   . Hallucination   . Benign essential HTN   . Ulcer of toe of right foot ([place])   . Ulcer of external ear, limited to breakdown of skin ([place])   . Chronic congestive heart failure ([place])   . Anemia of chronic disease   . Labile blood glucose   . Uncontrolled diabetes mellitus type 2 with peripheral artery disease ([place])   . Diabetic peripheral neuropathy ([place])   . Debility 12/27/2016  . History of transmetatarsal amputation of left foot ([place]) 12/27/2016  . Constipation   . Chronic obstructive pulmonary disease ([place])   . Nausea   . ESRD on dialysis ([place])   . Leukocytosis   . Tachypnea   . FUO (fever of unknown origin)   . Encephalopathy 12/23/2016  . Sore throat 12/23/2016  . Gangrene of left foot ([place]) 11/26/2016  . PAD (peripheral artery disease) ([place]) 11/26/2016  . Sjogren's syndrome ([place]) 09/26/2015  . Uncontrolled type 2 diabetes mellitus with hyperglycemia, with long-term current use of insulin ([place]) 09/26/2015  . Hyperosmolar non-ketotic state in patient with type 2 diabetes mellitus ([place]) 09/25/2015  . Ileus ([place]) 09/22/2015  . Acute respiratory failure with hypoxia ([place]) 09/22/2015  . Acute gouty arthritis 09/22/2015  . CAD (coronary artery disease) of artery bypass graft 09/22/2015  . Sepsis ([place]) 09/21/2015  . Elevated troponin level 09/21/2015  . ESRD on peritoneal dialysis ([place]) 09/21/2015  . CVA (cerebral infarction) 09/21/2015    Past Medical History:  Past Medical History:  Diagnosis Date  . AICD (automatic cardioverter/defibrillator) present    [employer] [phone] lead [id number]  . CHF (congestive heart failure) ([place])   . Complication of anesthesia   . COPD (chronic obstructive pulmonary disease) ([place])   . Coronary artery disease    CABG 2011  . Diabetes mellitus without complication ([place])   . ESRD on peritoneal dialysis [facility])    Started PD Dec 2016 in [place]. Nephrology is in  [place].   . Gangrene ([place])    left knee  . GERD (gastroesophageal reflux disease)   . History of peritonitis    PD cath related peritonitis in April 2017  . Hypertension   . Peripheral vascular disease ([place])   . PONV (postoperative nausea and vomiting)   . Sjogren's syndrome ([place])    Per pt's wife, was diagnosed in 2016 during w/u for cause of renal failure prior to starting dialysis.  He had a rash on his chest apparently prompting this w/u.  Never had renal bx.  He was referred to a rheum MD in [place] and per the wife he was treated with MTX and other medications but had side effects to "all of it" and isn't taking anything for this as of Jun 2017.   [name] Sleep apnea    wears Bipap  . Stroke ([place])   . Wound dehiscence    Right foot   Past Surgical History:  Past Surgical History:  Procedure Laterality Date  . ABDOMINAL AORTOGRAM W/LOWER EXTREMITY N/A 11/29/2016   Procedure: ABDOMINAL AORTOGRAM W/LOWER EXTREMITY;  Surgeon: Serafina Mitchell, MD;  Location: [place] CV LAB;  Service: Cardiovascular;  Laterality: N/A;  . AMPUTATION Left 11/29/2016   Procedure: LEFT THIRD TOE AMPUTATION;  Surgeon: Serafina Mitchell, MD;  [facility] OR;  Service: Vascular;  Laterality: Left;  . AMPUTATION Left 12/23/2016   Procedure: Left Transmetatarsal Amputation;  Surgeon: Newt Minion, MD;  Location: [place];  Service: Orthopedics;  Laterality: Left;  . AMPUTATION Left 01/11/2017   Procedure: LEFT BELOW KNEE AMPUTATION;  Surgeon: Newt Minion, MD;  Location: [place];  Service: Orthopedics;  Laterality: Left;  . AMPUTATION Right 01/20/2017   Procedure: AMPUTATION RIGHT FIFTH TOE;  Surgeon: Serafina Mitchell, MD;  Location: [place];  Service: Vascular;  Laterality: Right;  . AMPUTATION Right 03/01/2017   Procedure: RIGHT BELOW KNEE AMPUTATION;  Surgeon: Newt Minion, MD;  Location: [place];  Service: Orthopedics;  Laterality: Right;  . ANGIOPLASTY  12/15/2016   Procedure: BALLOON ANGIOPLASTY;  Surgeon:  Waynetta Sandy, MD;  Location: [place];  Service: Vascular;;  . cardiac catherization with stent placement  2017  . COLONOSCOPY W/ BIOPSIES AND POLYPECTOMY    . CORONARY ARTERY BYPASS GRAFT  2011  . FALSE ANEURYSM REPAIR Right 12/15/2016   Procedure: REPAIR FALSE ANEURYSM;  Surgeon: Waynetta Sandy, MD;  Location: [place];  Service: Vascular;  Laterality: Right;  . IRRIGATION AND DEBRIDEMENT FOOT Left 12/15/2016   Procedure: IRRIGATION AND DEBRIDEMENT FOOT;  Surgeon: Waynetta Sandy, MD;  Location: [place];  Service: Vascular;  Laterality: Left;  . LEFT HEART CATH AND CORS/GRAFTS ANGIOGRAPHY N/A 01/09/2017   Procedure: LEFT HEART CATH AND CORS/GRAFTS ANGIOGRAPHY;  Surgeon: Lorretta Harp, MD;  Location: [place] CV LAB;  Service: Cardiovascular;  Laterality: N/A;  . LOWER EXTREMITY ANGIOGRAM Left 12/15/2016   Procedure: LOWER EXTREMITY ANGIOGRAM; PEDAL ACCESS;  Surgeon: Waynetta Sandy, MD;  Location[facility]  OR;  Service: Vascular;  Laterality: Left;  . PERIPHERAL VASCULAR BALLOON ANGIOPLASTY  11/29/2016   Procedure: PERIPHERAL VASCULAR BALLOON ANGIOPLASTY;  Surgeon: Serafina Mitchell, MD;  Location: [place] CV LAB;  Service: Cardiovascular;;  LT Peroneal AT attempted unsuccessful  . STUMP REVISION Left 03/01/2017   Procedure: REVISION LEFT BELOW KNEE AMPUTATION;  Surgeon: Newt Minion, MD;  Location: [place];  Service: Orthopedics;  Laterality: Left;    Assessment & Plan Clinical Impression: Patient is a 76 y.o.malewith a history of diabetes mellitus and peripheral neuropathy, diastolic congestive heart failure, COPD, CAD with CABG 2011 and maintained on Plavix as well as aspirin, ESRD on PD as well as recent left BKA 12/23/2016 and 5th toe amp on right and received inpatient rehabilitation services 01/17/2017 until 02/07/2017. History taken from chart review, patient, and wife. Presented on 03/01/17 with progressive ischemic changes of the right foot as well as  gangrenous changes along the previous left BKA. 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 Pt has been limited by pain, surgeries, and vacs. Wound vacs were later discontinued 03/05/2017 per Dr. Sharol Given. CCPD followed by renal services. Troponin mildly elevated 0.17 felt to be demand ischemia and monitored. Acute on chronic anemia and monitored. Bouts of hypokalemia with supplement added. Patient with low-grade fever and presently maintained on Zosyn with blood cultures negative and urine study pending. PM&R was asked to see the patient regarding potential for inpatient rehab admission. Patient transferred to CIR on 03/06/2017 .   Patient currently requires max with mobility secondary to muscle weakness, decreased cardiorespiratoy endurance and decreased sitting balance and decreased balance strategies.  Prior to hospitalization, patient was min assist to supervision with mobility and lived with Spouse in a Mobile home home.  Home access is  Ramped entrance.  Patient will benefit from skilled PT intervention to maximize safe functional mobility, minimize fall risk and decrease caregiver burden for planned discharge home with 24 hour assist.  Anticipate patient will benefit from follow up [facility] at discharge.  PT - End of Session Activity Tolerance: Tolerates < 10 min activity, no significant change in vital signs Endurance Deficit: Yes Endurance Deficit Description: fatigues quickly and moderate increase in work of breathing w/ most mobility PT Assessment Rehab Potential (ACUTE/IP ONLY): Good PT Barriers to Discharge: [place] home environment;Home environment access/layout;Wound Care PT Barriers to Discharge Comments: Wife very involved and received a lot of EDU during last admission, has ramp  PT Patient demonstrates impairments in the following area(s): Balance;Endurance;Pain;Sensory;Skin Integrity PT Transfers Functional Problem(s): Bed  Mobility;Bed to Chair;Car;Furniture PT Locomotion Functional Problem(s): Wheelchair Mobility PT Plan PT Intensity: Minimum of 1-2 x/day ,45 to 90 minutes PT Frequency: 5 out of 7 days PT Duration Estimated Length of Stay: 10-14 days PT Treatment/Interventions: Balance/vestibular training;Community reintegration;Discharge planning;Cognitive remediation/compensation;Functional mobility training;Neuromuscular re-education;DME/adaptive equipment instruction;Disease management/prevention;Pain management;Patient/family education;Therapeutic Activities;Therapeutic Exercise;Psychosocial support;Skin care/wound management;UE/LE Strength taining/ROM;Splinting/orthotics;UE/LE Coordination activities;Wheelchair propulsion/positioning;Visual/perceptual remediation/compensation PT Transfers Anticipated Outcome(s): Min A  PT Locomotion Anticipated Outcome(s): supervision w/c mobility only (bilateral BKA) - in home environment PT Recommendation Follow Up Recommendations: Home health PT Patient destination: Home Equipment Recommended: To be determined Equipment Details: family has commode, slide board, w/c (manual and motorized loaner) from previous admission   Skilled Therapeutic Intervention   Pt supine upon arrival and agreeable to therapy, no c/o pain. IV noted to be leaking onto bed, RN made aware and RN present performing nursing care at beginning  of session. Discussed pt's PLOF before most recent L BKA revision and R BKA surgeries. Per pt and wife, he was transferring supervision w/c to/from recliner, however needed Min A for more uneven transfers such as getting in and out of car. He reports using his manual w/c for most mobility around the home, however he does have a motorized loaner w/c as his custom motorized w/c is on order - he uses that less often. Performed functional mobility as outlined below w/ increased time in between transitions 2/2 fatigue and needing rest breaks. Required Min A from wife for  sitting balance at EOB while PT performing w/c management - switched w/c's and switched to pressure relieving cushion. Transferred to/from EOB w/ slide board and Max A overall and self-propelled w/c 40' using BUEs at a very slow speed. Returned to room and ended session in supine and in care of wife, all needs met. PT instructed patient in PT Evaluation and initiated treatment intervention; see below for results. PT educated patient in [place], rehab potential, rehab goals, and discharge recommendations.   PT Evaluation Precautions/Restrictions Precautions Precautions: Fall Restrictions Weight Bearing Restrictions: Yes RLE Weight Bearing: Non weight bearing RLE Partial Weight Bearing Percentage or Pounds: R BKA LLE Weight Bearing: Non weight bearing LLE Partial Weight Bearing Percentage or Pounds: L BKA Pain Pain Assessment Pain Assessment: No/denies pain Home Living/Prior Functioning Home Living Living Arrangements: Spouse/significant other Available Help at Discharge: Family;Available 24 hours/day Type of Home: Mobile home Home Access: Ramped entrance Home Layout: One level Bathroom Shower/Tub: Walk-in shower;Door [employer] Toilet: Standard  Lives With: Spouse Prior Function Level of Independence: Independent with transfers;Needs assistance with tranfers;Needs assistance with ADLs;Needs assistance with homemaking;Requires assistive device for independence(Independent w/ some transfers, needs assistance for uneven slide board transfers)  Able to Take Stairs?: No Driving: No 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 Praxis: Intact  Cognition Overall Cognitive Status: Within Functional Limits for tasks assessed Arousal/Alertness: Awake/alert Orientation Level: Oriented X4 Attention: Focused Focused Attention: Appears intact Sustained Attention: Appears intact Memory: Appears intact Awareness: Appears intact Problem Solving:  Appears intact Safety/Judgment: Appears intact Sensation Sensation Light Touch: Impaired by gross assessment Light Touch Impaired Details: Impaired RLE;Impaired LLE(impaired R>L along residual limb) Proprioception: Appears Intact Coordination Gross Motor Movements are Fluid and Coordinated: Yes Fine Motor Movements are Fluid and Coordinated: Yes Motor  Motor Motor: Other (comment) Motor - Skilled Clinical Observations: generalized weakness, bilateral BKAs  Mobility Bed Mobility Bed Mobility: Rolling Right;Rolling Left;Supine to Sit;Sit to Supine Rolling Right: 3: Mod assist Rolling Right Details: Manual facilitation for weight shifting;Manual facilitation for placement;Manual facilitation for weight bearing;Verbal cues for technique Rolling Left: 3: Mod assist Rolling Left Details: Verbal cues for technique;Manual facilitation for weight shifting;Manual facilitation for placement;Manual facilitation for weight bearing Supine to Sit: 3: Mod assist Supine to Sit Details: Manual facilitation for placement;Manual facilitation for weight shifting;Manual facilitation for weight bearing;Verbal cues for technique Sit to Supine: 3: Mod assist Sit to Supine - Details: Manual facilitation for weight bearing;Manual facilitation for weight shifting;Manual facilitation for placement;Verbal cues for technique Transfers Transfers: Yes Lateral/Scoot Transfers: 2: Max assist Lateral/Scoot Transfer Details: Manual facilitation for weight bearing;Manual facilitation for weight shifting;Manual facilitation for placement;Verbal cues for technique;Verbal cues for safe use of DME/AE Lateral/Scoot Transfer Details (indicate cue type and reason): Mod A downhill, Max A uphill  Locomotion  Ambulation Ambulation: No Gait Gait: No Stairs / Additional Locomotion Stairs: No Wheelchair Mobility Wheelchair Mobility: Yes Wheelchair Assistance: 5: Investment banker, operational  Details: Verbal cues for  technique Wheelchair Propulsion: Both upper extremities Wheelchair Parts Management: Needs assistance Distance: 64'  Trunk/Postural Assessment  Cervical Assessment Cervical Assessment: Exceptions to WFL(forward head, rounded shoulder posture) Thoracic Assessment Thoracic Assessment: Within Functional Limits Lumbar Assessment Lumbar Assessment: Exceptions to WFL(posterior pelvic tilt) Postural Control Postural Control: Deficits on evaluation(posterior lean in sitting)  Balance Balance Balance Assessed: Yes Static Sitting Balance Static Sitting - Balance Support: Right upper extremity supported Static Sitting - Level of Assistance: 4: Min assist Dynamic Sitting Balance Dynamic Sitting - Balance Support: During functional activity;No upper extremity supported Dynamic Sitting - Level of Assistance: 3: Mod assist Sitting balance - Comments: posterior lean, increased difficulty returning to upright Extremity Assessment  RLE Assessment RLE Assessment: Exceptions to [facility] RLE Strength RLE Overall Strength Comments: R BKA, 4/5 hip ms, unable to test knee ms 2/2 pain LLE Assessment LLE Assessment: Exceptions to [facility] LLE Strength LLE Overall Strength Comments: L BKA, 4/5 hip ms, unable to test knee ms 2/2 pain   See Function Navigator for Current Functional Status.   Refer to Care Plan for Long Term Goals  Recommendations for other services: None   Discharge Criteria: Patient will be discharged from PT if patient refuses treatment 3 consecutive times without medical reason, if treatment goals not met, if there is a change in medical status, if patient makes no progress towards goals or if patient is discharged from hospital.  The above assessment, treatment plan, treatment alternatives and goals were discussed and mutually agreed upon: by patient and by family  Candelario Steppe K Arnette 03/07/2017, 12:45 PM

## 2017-03-07 NOTE — Progress Notes (Signed)
Patient information reviewed and entered into eRehab system by Raynesha Tiedt, RN, CRRN, PPS Coordinator.  Information including medical coding and functional independence measure will be reviewed and updated through discharge.     Per nursing patient was given "Data Collection Information Summary for Patients in Inpatient Rehabilitation Facilities with attached "Privacy Act Statement-Health Care Records" upon admission.  

## 2017-03-07 NOTE — Progress Notes (Signed)
[place] PHYSICAL MEDICINE & REHABILITATION     PROGRESS NOTE  Subjective/Complaints:  Pt seen laying in bed this AM.  He slept well overnight.  Height is at bedside who has questions about heparin.  ROS: Denies CP, SOB, nausea, vomiting, diarrhea.  Objective: Vital Signs: Blood pressure 135/64, pulse (!) 101, temperature (!) 97.5 F (36.4 C), temperature source Oral, resp. rate 20, height 5\' 9"  (1.753 m), weight 102.3 kg (225 lb 8.5 oz), SpO2 100 %. No results found. Recent Labs    03/04/17 2247 03/06/17 0205  WBC 12.5* 12.5*  HGB 8.1* 8.1*  HCT 25.3* 25.6*  PLT 287 300   Recent Labs    03/04/17 2247 03/06/17 0205 03/07/17 0555  NA 130*  --  130*  K 2.5* 3.8 3.6  CL 97*  --  95*  GLUCOSE 341*  --  383*  BUN 37*  --  43*  CREATININE 4.73*  --  4.71*  CALCIUM 7.3*  --  7.5*   CBG (last 3)  Recent Labs    03/06/17 1712 03/06/17 2108 03/07/17 0632  GLUCAP 295* 297* 399*    Wt Readings from Last 3 Encounters:  03/07/17 102.3 kg (225 lb 8.5 oz)  03/05/17 97.4 kg (214 lb 11.7 oz)  02/27/17 65.8 kg (145 lb)    Physical Exam:  BP 135/64 (BP Location: Right Arm)   Pulse (!) 101   Temp (!) 97.5 F (36.4 C) (Oral)   Resp 20   Ht 5\' 9"  (1.753 m)   Wt 102.3 kg (225 lb 8.5 oz)   SpO2 100%   BMI 33.31 kg/m  Constitutional: He appears well-developed and well-nourished. Vitals reviewed. HENT: Normocephalic. Ulcer on bridge of nose  Eyes: EOM are normal. No discharge.  Cardiovascular: Normal rate, regular rhythm. No JVD.  Respiratory: Effort normal and breath sounds normal.  GI: Bowel sounds are normal. He exhibits no distension.  Musculoskeletal: He exhibits edema and tenderness b/l stumps, R>L.  Neurological: He is alert and oriented.  Motor: B/l UE 4+/5 proximal to distal RLE: HF 2-/5 (pain inhibition) LLE: HF 3+/5 (pain inhibition)  Skin: See above. B/l BKA with mild sanguinous drainage. Psychiatric: He has a normal mood and affect. His behavior is normal.    Assessment/Plan: 1. 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 Physiatrist is providing close team supervision and 24 hour management of active medical problems listed below. Physiatrist and rehab team continue to assess barriers to discharge/monitor patient progress toward functional and medical goals.  Function:  Bathing Bathing position      Bathing parts      Bathing assist        Upper Body Dressing/Undressing Upper body dressing                    Upper body assist        Lower Body Dressing/Undressing Lower body dressing                                  Lower body assist        Toileting Toileting     Toileting steps completed by helper: Adjust clothing prior to toileting, Performs perineal hygiene, Adjust clothing after toileting Toileting Assistive Devices: Grab bar or rail  Toileting assist     Transfers Chair/bed transfer  Secondary school teacherLocomotion Ambulation           Wheelchair          Cognition Comprehension Comprehension assist level: Understands basic 90% of the time/cues < 10% of the time  Expression Expression assist level: Expresses basic 90% of the time/requires cueing < 10% of the time.  Social Interaction Social Interaction assist level: Interacts appropriately 90% of the time - Needs monitoring or encouragement for participation or interaction.  Problem Solving Problem solving assist level: Solves basic 90% of the time/requires cueing < 10% of the time  Memory Memory assist level: Recognizes or recalls 90% of the time/requires cueing < 10% of the time    Medical Problem List and Plan: 1.  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 Bilateral wound vacs discontinued 03/05/2017 per [name]  Begin CIR 2.  DVT Prophylaxis/Anticoagulation: Will inquire about  chemical anticoagulation 3. Pain Management: Oxycodone as needed 4. Mood: Provide emotional support 5. Neuropsych: This patient is capable of making decisions on his own behalf. 6. Skin/Wound Care: Routine skin checks 7. Fluids/Electrolytes/Nutrition: Routine I&O's  8. Acute on chronic anemia. Continue Aranesp  Hb pending on 11/27 9. End-stage renal disease/CPPD. Follow-up renal services 10. Diabetes mellitus peripheral neuropathy. Latest hemoglobin A1c 8.8. Lantus insulin 10 units daily at bedtime, NovoLog 3 units 3 times a day with meals. Check blood sugars before meals and at bedtime  Monitor with his mobility, will be mindful as patient is very sensitive to changes. 11. CAD with CABG. Continue Plavix and aspirin. No chest pain or shortness of breath 12. Diastolic congestive heart failure. Monitor for any signs of fluid overload Filed Weights   03/07/17 0508  Weight: 102.3 kg (225 lb 8.5 oz)  13. SIRS. Currently maintained on Zosyn. Blood cultures no growth. Monitor for any fever  Will inquire about longevity of the antibiotics 14. Hypertension. Coreg 6.25 mg daily at bedtime. Monitor for orthostasis  Monitor with increased mobility 15. Hyperlipidemia. Lipitor 16. Constipation. Laxative assistance 16. Leukocytosis  WBCs 12.5 on 11/26  Continue to monitor  LOS (Days) 1 A FACE TO FACE EVALUATION WAS PERFORMED  [name] 03/07/2017 8:00 AM

## 2017-03-07 NOTE — Care Management Note (Signed)
Inpatient Rehabilitation Center Individual Statement of Services  Patient Name:  Joshua [name]  Date:  03/07/2017  Welcome to the Inpatient Rehabilitation Center.  Our goal is to provide you with an individualized program based on your diagnosis and situation, designed to meet your specific needs.  With this comprehensive rehabilitation program, you will be expected to participate in at least 3 hours of rehabilitation therapies Monday-Friday, with modified therapy programming on the weekends.  Your rehabilitation program will include the following services:  Physical Therapy (PT), Occupational Therapy (OT), 24 hour per day rehabilitation nursing, Neuropsychology, Case Management (Social Worker), Rehabilitation Medicine, Nutrition Services and Pharmacy Services  Weekly team conferences will be held on Wednesday to discuss your progress.  Your Social Worker will talk with you frequently to get your input and to update you on team discussions.  Team conferences with you and your family in attendance may also be held.  Expected length of stay: 12-14 days  Overall anticipated outcome: supervision-min assist wheelchair level  Depending on your progress and recovery, your program may change. Your Social Worker will coordinate services and will keep you informed of any changes. Your Social Worker's name and contact numbers are listed  below.  The following services may also be recommended but are not provided by the Inpatient Rehabilitation Center:    Home Health Rehabiltiation Services  Outpatient Rehabilitation Services    Arrangements will be made to provide these services after discharge if needed.  Arrangements include referral to agencies that provide these services.  Your insurance has been verified to be:  Medicare & BCBS Your primary doctor is:  Damaris SchoonerSusan Dhivianathan  Pertinent information will be shared with your doctor and your insurance company.  Social Worker:  Dossie DerBecky Marites Nath, SW  [phone] or ([phone]) [phone]  Information discussed with and copy given to patient by: Lucy Chrisupree, Ritamarie Arkin G, 03/07/2017, 9:27 AM

## 2017-03-07 NOTE — Consult Note (Signed)
Physical Medicine and Rehabilitation Consult Reason for Consult: decreased functional mobility Referring Physician: duda   HPI: [name] is a 76 y.o. 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 Pt has been limited by pain, surgeries, and vacs. PM&R was asked to see the patient regarding potential for inpatient rehab admission.    Review of Systems  Constitutional: Negative for chills.  HENT: Negative for hearing loss.   Eyes: Negative for double vision.  Respiratory: Negative for hemoptysis.   Cardiovascular: Negative for palpitations.  Gastrointestinal: Negative for nausea.  Genitourinary: Negative for urgency.  Musculoskeletal: Positive for joint pain and myalgias.  Skin: Negative for itching.  Neurological: Positive for focal weakness. Negative for headaches.  Psychiatric/Behavioral: Negative for suicidal ideas.   Past Medical History:  Diagnosis Date  . AICD (automatic cardioverter/defibrillator) present    [employer] [phone] lead [id number]  . CHF (congestive heart failure) (HCC)   . Complication of anesthesia   . COPD (chronic obstructive pulmonary disease) (HCC)   . Coronary artery disease    CABG 2011  . Diabetes mellitus without complication (HCC)   . ESRD on peritoneal dialysis [facility](HCC)    [place] in [place]   . Gangrene (HCC)    left knee  . GERD (gastroesophageal reflux disease)   . History of peritonitis    PD cath related peritonitis in April 2017  . Hypertension   . Peripheral vascular disease (HCC)   . PONV (postoperative nausea and vomiting)   . Sjogren's syndrome (HCC)    Per pt's wife, was diagnosed in 2016  during w/u for cause of renal failure prior to starting dialysis.  He had a rash on his chest apparently prompting this w/u.  Never had renal bx.  He [place] in [place] and per the wife he was treated with MTX and other medications but had side effects to "all of it" and isn't taking anything for this as of Jun 2017.   Joshua Kitchen. Sleep apnea    wears Bipap  . Stroke (HCC)   . Wound dehiscence    Right foot   Past Surgical History:  Procedure Laterality Date  . ABDOMINAL AORTOGRAM W/LOWER EXTREMITY N/A 11/29/2016   Procedure: ABDOMINAL AORTOGRAM W/LOWER EXTREMITY;  Surgeon: Nada LibmanBrabham, Vance W, MD;  [facility]: [facility];  Service: Cardiovascular;  Laterality: N/A;  . AMPUTATION Left 11/29/2016   Procedure: LEFT THIRD TOE AMPUTATION;  Surgeon: Nada LibmanBrabham, Vance W, MD;  [facility];  Service: Vascular;  Laterality: Left;  . AMPUTATION Left 12/23/2016   Procedure: Left Transmetatarsal Amputation;  Surgeon: [name], Marcus V, MD;  [facility];  Service: Orthopedics;  Laterality: Left;  . AMPUTATION Left 01/11/2017   Procedure: LEFT BELOW KNEE AMPUTATION;  Surgeon: [name], Marcus V, MD;  [facility];  Service: Orthopedics;  Laterality: Left;  . AMPUTATION Right 01/20/2017   Procedure: AMPUTATION RIGHT FIFTH TOE;  Surgeon: Nada LibmanBrabham, Vance W, MD;  Location: [facility] OR;  Service: Vascular;  Laterality: Right;  . AMPUTATION Right 03/01/2017   Procedure: RIGHT BELOW KNEE AMPUTATION;  Surgeon:  [name], Marcus V, MD;  [facility];  Service: Orthopedics;  Laterality: Right;  . ANGIOPLASTY  12/15/2016   Procedure: BALLOON ANGIOPLASTY;  Surgeon: Maeola Harmanain, Brandon Christopher, MD;  [facility];  Service: Vascular;;  . cardiac catherization with stent placement  2017  . COLONOSCOPY W/ BIOPSIES AND POLYPECTOMY    . CORONARY ARTERY BYPASS GRAFT  2011  . FALSE ANEURYSM REPAIR Right 12/15/2016   Procedure: REPAIR FALSE ANEURYSM;  Surgeon: Maeola Harmanain, Brandon Christopher, MD;  [facility];  Service:  Vascular;  Laterality: Right;  . IRRIGATION AND DEBRIDEMENT FOOT Left 12/15/2016   Procedure: IRRIGATION AND DEBRIDEMENT FOOT;  Surgeon: Maeola Harmanain, Brandon Christopher, MD;  [facility];  Service: Vascular;  Laterality: Left;  . LEFT HEART CATH AND CORS/GRAFTS ANGIOGRAPHY N/A 01/09/2017   Procedure: LEFT HEART CATH AND CORS/GRAFTS ANGIOGRAPHY;  Surgeon: Runell GessBerry, Jonathan J, MD;  [facility];  Service: Cardiovascular;  Laterality: N/A;  . LOWER EXTREMITY ANGIOGRAM Left 12/15/2016   Procedure: LOWER EXTREMITY ANGIOGRAM; PEDAL ACCESS;  Surgeon: Maeola Harmanain, Brandon Christopher, MD;  [facility];  Service: Vascular;  Laterality: Left;  . PERIPHERAL VASCULAR BALLOON ANGIOPLASTY  11/29/2016   Procedure: PERIPHERAL VASCULAR BALLOON ANGIOPLASTY;  Surgeon: Nada LibmanBrabham, Vance W, MD;  [facility];  Service: Cardiovascular;;  LT Peroneal AT attempted unsuccessful  . STUMP REVISION Left 03/01/2017   Procedure: REVISION LEFT BELOW KNEE AMPUTATION;  Surgeon: [name], Marcus V, MD;  [facility];  Service: Orthopedics;  Laterality: Left;   Family History  Problem Relation Age of Onset  . Heart disease Father   . Diabetes Mother    Social History:  reports that he has quit smoking. His smoking use included pipe. he has never used smokeless tobacco. He reports that he does not drink alcohol or use drugs. Allergies:  Allergies  Allergen Reactions  . Gabapentin (Once-Daily) Hypertension  . Lisinopril Hives  . Methotrexate Derivatives Other (See Comments)    blisters  . Sulfa Antibiotics Hives  . Tape Hives  . Vancomycin Other (See Comments)    Blisters   . Zanaflex [Tizanidine Hcl]     Hypotensive stroke   Medications Prior to Admission  Medication Sig Dispense Refill  . aspirin EC 81 MG tablet Take 81 mg by mouth daily.    Joshua Kitchen. atorvastatin (LIPITOR) 80 MG tablet Take 1 tablet (80 mg total) by mouth daily at 6 PM. 30 tablet 0  . calcium acetate (PHOSLO) 667 MG capsule Take 1 capsule  (667 mg total) by mouth 3 (three) times daily with meals. 90 capsule 0  . carvedilol (COREG) 6.25 MG tablet Take 1 tablet (6.25 mg total) by mouth at bedtime. 30 tablet 0  . clopidogrel (PLAVIX) 75 MG tablet Take 1 tablet (75 mg total) by mouth daily. 30 tablet 0  . cyclobenzaprine (FLEXERIL) 10 MG tablet Take 1 tablet (10 mg total) 2 (two) times daily as needed by mouth for muscle spasms. 30 tablet 0  . docusate sodium (COLACE) 100 MG capsule Take 100 mg by mouth 2 (two) times daily.     . insulin NPH Human (HUMULIN N,NOVOLIN N) 100 UNIT/ML injection Inject 0.1 mLs (10 Units total) into the skin daily before breakfast. 10 mL 11  . insulin NPH Human (HUMULIN N,NOVOLIN N) 100 UNIT/ML injection Inject 0.65 mLs (65 Units total) into the skin at bedtime. 10 mL 11  . multivitamin (RENA-VIT) TABS tablet Take 1 tablet by mouth at bedtime.  0  . nitroGLYCERIN (NITROSTAT) 0.4  MG SL tablet Place 0.4 mg under the tongue every 5 (five) minutes as needed for chest pain.    Joshua Kitchen oxyCODONE (OXY IR/ROXICODONE) 5 MG immediate release tablet Take 0.5 tablets (2.5 mg total) by mouth every 6 (six) hours as needed for severe pain. 20 tablet 0  . polyethylene glycol (MIRALAX / GLYCOLAX) packet Take 17 g by mouth daily.    Joshua Kitchen RELION INSULIN SYRINGE 31G X 15/64" 1 ML MISC     . silver sulfADIAZINE (SILVADENE) 1 % cream Apply 1 application daily topically. 50 g 0  . Vitamin D, Ergocalciferol, (DRISDOL) 50000 units CAPS capsule Take 50,000 Units by mouth every 30 (thirty) days.       Home: Home Living Family/patient expects to be discharged to:: Private residence Living Arrangements: Spouse/significant other  Functional History:   Functional Status:  Mobility:          ADL:    Cognition: Cognition Orientation Level: Oriented to person, Oriented to place, Oriented to situation, Disoriented to time    Blood pressure 135/64, pulse (!) 101, temperature (!) 97.5 F (36.4 C), temperature source Oral, resp. rate  20, height 5\' 9"  (1.753 m), weight 102.3 kg (225 lb 8.5 oz), SpO2 100 %. Physical Exam  Constitutional: He appears well-developed.  HENT:  Head: Normocephalic.  Eyes: EOM are normal.  Neck: No thyromegaly present.  Cardiovascular: Normal rate.  Respiratory: No respiratory distress.  GI: He exhibits no distension.  Musculoskeletal:  Both amputation sites are connected to vacuum pumps.  Dressing is sealed.  Legs are somewhat edematous  Neurological: No cranial nerve deficit.  Upper extremity motor exam grossly 5 out of 5.  Cognitively is intact  Psychiatric:  Affect slightly flat    Results for orders placed or performed during the hospital encounter of 03/06/17 (from the past 24 hour(s))  Glucose, capillary     Status: Abnormal   Collection Time: 03/06/17  9:08 PM  Result Value Ref Range   Glucose-Capillary 297 (H) 65 - 99 mg/dL  Renal function panel     Status: Abnormal   Collection Time: 03/07/17  5:55 AM  Result Value Ref Range   Sodium 130 (L) 135 - 145 mmol/L   Potassium 3.6 3.5 - 5.1 mmol/L   Chloride 95 (L) 101 - 111 mmol/L   CO2 23 22 - 32 mmol/L   Glucose, Bld 383 (H) 65 - 99 mg/dL   BUN 43 (H) 6 - 20 mg/dL   Creatinine, Ser 5.78 (H) 0.61 - 1.24 mg/dL   Calcium 7.5 (L) 8.9 - 10.3 mg/dL   Phosphorus 3.7 2.5 - 4.6 mg/dL   Albumin 1.3 (L) 3.5 - 5.0 g/dL   GFR calc non Af Amer 11 (L) >60 mL/min   GFR calc Af Amer 13 (L) >60 mL/min   Anion gap 12 5 - 15  Glucose, capillary     Status: Abnormal   Collection Time: 03/07/17  6:32 AM  Result Value Ref Range   Glucose-Capillary 399 (H) 65 - 99 mg/dL   No results found.  Assessment/Plan: Diagnosis: Status post left below-knee amputation revision and status post new right below-knee amputation 1. Does the need for close, 24 hr/day medical supervision in concert with the patient's rehab needs make it unreasonable for this patient to be served in a less intensive setting? Yes 2. Co-Morbidities requiring  supervision/potential complications: Postoperative pain considerations, diabetes type 2, end-stage renal disease on peritoneal dialysis 3. Due to bladder management, bowel management, safety, skin/wound care, disease management,  medication administration, pain management and patient education, does the patient require 24 hr/day rehab nursing? Yes 4. Does the patient require coordinated care of a physician, rehab nurse, PT (1-2 hrs/day, 5 days/week) and OT (1-2 hrs/day, 5 days/week) to address physical and functional deficits in the context of the above medical diagnosis(es)? Yes Addressing deficits in the following areas: balance, endurance, locomotion, strength, transferring, bowel/bladder control, bathing, dressing, feeding, grooming, toileting and psychosocial support 5. Can the patient actively participate in an intensive therapy program of at least 3 hrs of therapy per day at least 5 days per week? Yes 6. The potential for patient to make measurable gains while on inpatient rehab is excellent 7. Anticipated functional outcomes upon discharge from inpatient rehab are modified independent and supervision  with PT, modified independent, supervision and min assist with OT, n/a with SLP. 8. Estimated rehab length of stay to reach the above functional goals is: 8-13 days 9. Anticipated D/C setting: Home 10. Anticipated post D/C treatments: HH therapy 11. Overall Rehab/Functional Prognosis: excellent  RECOMMENDATIONS: This patient's condition is appropriate for continued rehabilitative care in the following setting: CIR Patient has agreed to participate in recommended program. Yes Note that insurance prior authorization may be required for reimbursement for recommended care.  Comment: Rehab Admissions Coordinator to follow up.  Thanks,  Ranelle Oyster, MD, [place]    Trish Mage, RN 03/07/2017

## 2017-03-08 ENCOUNTER — Inpatient Hospital Stay (HOSPITAL_COMMUNITY): Payer: Medicare Other

## 2017-03-08 ENCOUNTER — Inpatient Hospital Stay (HOSPITAL_COMMUNITY): Payer: Medicare Other | Admitting: Physical Therapy

## 2017-03-08 ENCOUNTER — Encounter (HOSPITAL_COMMUNITY): Payer: Medicare Other | Admitting: Psychology

## 2017-03-08 DIAGNOSIS — E109 Type 1 diabetes mellitus without complications: Secondary | ICD-10-CM

## 2017-03-08 LAB — GLUCOSE, CAPILLARY
GLUCOSE-CAPILLARY: 361 mg/dL — AB (ref 65–99)
GLUCOSE-CAPILLARY: 183 mg/dL — AB (ref 65–99)
Glucose-Capillary: 147 mg/dL — ABNORMAL HIGH (ref 65–99)

## 2017-03-08 MED ORDER — HEPARIN 1000 UNIT/ML FOR PERITONEAL DIALYSIS: INTRAPERITONEAL | Status: DC | PRN

## 2017-03-08 MED ORDER — DELFLEX-LC/2.5% DEXTROSE 394 MOSM/L IP SOLN
INTRAPERITONEAL | Status: DC
  Administered 2017-03-08 – 2017-03-09 (×2): 5000 mL via INTRAPERITONEAL

## 2017-03-08 MED ORDER — HEPARIN 1000 UNIT/ML FOR PERITONEAL DIALYSIS
INTRAPERITONEAL | Status: DC | PRN
  Filled 2017-03-08: qty 5000

## 2017-03-08 MED ORDER — GENTAMICIN SULFATE 0.1 % EX CREA
1.0000 "application " | TOPICAL_CREAM | Freq: Every day | CUTANEOUS | Status: DC
  Administered 2017-03-08 – 2017-03-09 (×3): 1 via TOPICAL
  Filled 2017-03-08: qty 15

## 2017-03-08 MED ORDER — DELFLEX-LC/1.5% DEXTROSE 344 MOSM/L IP SOLN: INTRAPERITONEAL | Status: DC

## 2017-03-08 MED ORDER — INSULIN GLARGINE 100 UNIT/ML ~~LOC~~ SOLN
15.0000 [IU] | Freq: Every day | SUBCUTANEOUS | Status: DC
  Administered 2017-03-08 – 2017-03-09 (×2): 15 [IU] via SUBCUTANEOUS
  Filled 2017-03-08 (×2): qty 0.15

## 2017-03-08 MED ORDER — HEPARIN 1000 UNIT/ML FOR PERITONEAL DIALYSIS: 500.0000 [IU] | INTRAMUSCULAR | Status: DC | PRN

## 2017-03-08 NOTE — Consult Note (Signed)
Neuropsychological Consultation   Patient:   [name]   DOB:   [birth year]  MR Number:  [id number]  [facility]  [facility] [address] [facility] [address] [id number] [address] [place] [id number] Dept: [phone] Loc: [phone]           Date of Service:   03/08/2017   Start Time:   8 PM End Time:   9 PM  Provider/Observer:  Arley PhenixJohn Mayo Faulk, Psy.D.       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  Presented on 03/01/2017 with progressive ischemic changes as well as gangrenous changes.  Had BKA of right leg.  The patient has been struggling to cope with bilateral BKAs now and learning how to manage with ADLs.    Reason for Service:  [name] was referred for neuropsychological/psychological consultation to facilitate with coping and adapting to recent surgical interventions and now having both legs amputated.  Below is the HPI for the current admission.  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 History taken from chart review, patient, and wife. Presented on 03/01/17 with progressive ischemic changes of the right foot as well as gangrenous changes along the previous left BKA. On the same day he underwent right BKA and  revision of the left BKA with app of Praveena wound vacs x 2. Pt has been limited by pain, surgeries, and vacs. Wound vacs were later discontinued 03/05/2017 per [name]. CCPD followed by renal services. Troponin mildly elevated 0.17 felt to be demand ischemia and monitored. Acute on chronic anemia and monitored. Bouts of hypokalemia with supplement added. Patient with low-grade fever and presently maintained on Zosyn with blood cultures negative and urine study pending. PM&R was asked to see the patient regarding potential for inpatient rehab admission. Patient was admitted for a comprehensive rehabilitation program.  Current Status:  The patient is struggling with loss of second leg and worries that after he struggled but was ultimately cope with loss of first he will not be able to deal with second BKA.    Behavioral Observation: [name]  presents as a 76 y.o.-year-old Right Caucasian Male who appeared his stated age. his dress was Appropriate and he was Well Groomed and his manners were Appropriate to the situation.  his participation was indicative of Appropriate and Attentive behaviors.  There were physical disabilities noted.  he displayed an appropriate level of cooperation and motivation.     Interactions:    Active Appropriate and Attentive  Attention:   within normal limits and attention span and concentration were age appropriate  Memory:   within normal limits; recent and remote memory intact  Visuo-spatial:  within normal limits  Speech (Volume):  low  Speech:   normal; normal  Thought Process:  Coherent  and Relevant  Though Content:  WNL; not suicidal  Orientation:   person, place, time/date and situation  Judgment:   Good  Planning:   Good  Affect:    Anxious and Lethargic  Mood:    Anxious  Insight:   Good  Intelligence:   normal  Medical History:   Past Medical History:  Diagnosis Date  . AICD (automatic cardioverter/defibrillator) present    [employer] [id number] lead [id number]  . CHF (congestive heart failure) (HCC)   . Complication of anesthesia   . COPD (chronic obstructive pulmonary disease) (HCC)   . Coronary artery disease    CABG 2011  . Diabetes mellitus without complication (HCC)   . ESRD on peritoneal dialysis [facility](HCC)    [place] in [place].   . Gangrene (HCC)    left knee  . GERD (gastroesophageal reflux disease)   . History of peritonitis    PD cath related peritonitis in April 2017  . Hypertension   . Peripheral vascular disease (HCC)   . PONV (postoperative nausea and vomiting)   . Sjogren's syndrome (HCC)    Per pt's wife, was diagnosed in 2016 during w/u for cause of renal failure prior to starting dialysis.  He had a rash on his chest apparently prompting this w/u.  Never had renal bx.  He [place] and per the wife he was treated with MTX and other medications but had side effects to "all of it" and isn't taking anything for this as of Jun 2017.   Joshua Kitchen. Sleep apnea    wears Bipap  . Stroke (HCC)   . Wound dehiscence    Right foot            Family Med/Psych History:  Family History  Problem Relation Age of Onset  . Heart disease Father   . 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  Presented on 03/01/2017 with progressive ischemic changes as well as gangrenous changes.  Had BKA of right leg.  The patient has been struggling to cope with bilateral BKAs now and learning how to manage with ADLs.    The patient is struggling with loss of  second leg and worries that after he struggled but was ultimately cope with loss of first he will not be able to deal with second BKA.     Disposition/Plan:  Worked on coping with second BKA.  Will see again first of next week.         Electronically Signed   _______________________ Arley PhenixJohn Siham Bucaro, Psy.D.

## 2017-03-08 NOTE — Progress Notes (Signed)
Physical Therapy Session Note  Patient Details  Name: [name] MRN: [id number] Date of Birth: 11/28/1940  Today's Date: 03/08/2017 PT Individual Time: 1410-1505 PT Individual Time Calculation (min): 55 min   Short Term Goals: Week 1:  PT Short Term Goal 1 (Week 1): Pt will perform bed mobility w/ Min A  PT Short Term Goal 2 (Week 1): Pt will transfer bed<>chair w/ Mod A  PT Short Term Goal 3 (Week 1): Pt will self-propel w/c 41' w/ supervision PT Short Term Goal 4 (Week 1): Pt will maintain static sitting balance w/ supervision   Skilled Therapeutic Interventions/Progress Updates: Pt presented in bed laying sideways with residual limbs resting on w/c agreeable to therapy. Pt notified PTA that nsg was monitoring pressure ulcer. PTA changed w/c cushion to hybrid Roho (20x20 although w/c 20x18, notified primary PT of change of cushion). Pt performed SB transfer to L with modA. Pt propelled 15f for UE strengthening and endurance. Pt encouraged to increase elbow flexion and increase reach back to facilitate propulsion. Pt returned to room total assist and remained in w/c with wife present and needs met.      Therapy Documentation Precautions:  Precautions Precautions: Fall Restrictions Weight Bearing Restrictions: Yes RLE Weight Bearing: Non weight bearing RLE Partial Weight Bearing Percentage or Pounds: R BKA LLE Weight Bearing: Non weight bearing LLE Partial Weight Bearing Percentage or Pounds: L BKA General:   Vital Signs: Therapy Vitals Temp: 97.7 F (36.5 C) Temp Source: Oral Pulse Rate: 100 Resp: 18 BP: (!) 127/58 Patient Position (if appropriate): Lying Oxygen Therapy SpO2: 100 % O2 Device: Not Delivered See Function Navigator for Current Functional Status.   Therapy/Group: Individual Therapy  Nyal Schachter  Stephanieann Popescu, PTA  03/08/2017, 3:20 PM

## 2017-03-08 NOTE — Progress Notes (Signed)
Noted to flat affect. Staff will continue to monitor and meet needs.

## 2017-03-08 NOTE — Plan of Care (Signed)
  RH SKIN INTEGRITY RH STG SKIN FREE OF INFECTION/BREAKDOWN Description No new breakdown/infection with mod assist   03/08/2017 2148 - Progressing by Pollyann SavoyLloyd, Kathlen Sakurai D, RN   RH SAFETY RH STG ADHERE TO SAFETY PRECAUTIONS W/ASSISTANCE/DEVICE Description STG Adhere to Safety Precautions With Min Assistance/Device.  03/08/2017 2148 - Progressing by Pollyann SavoyLloyd, Braxton Vantrease D, RN

## 2017-03-08 NOTE — Progress Notes (Signed)
Pt [facility] BP checked manually, WNL. Pt states symptoms improved after resting. RN aware.

## 2017-03-08 NOTE — Progress Notes (Signed)
Occupational Therapy Session Note  Patient Details  Name: Joshua KitchenJames D Hanway MRN: [id number] Date of Birth: 01-Mar-1941  Today's Date: 03/08/2017 OT Individual Time: 1000-1100 OT Individual Time Calculation (min): 60 min    Short Term Goals: Week 1:  OT Short Term Goal 1 (Week 1): Pt will transfer to drop arm BSC with mod assist with slide board. OT Short Term Goal 2 (Week 1): Pt will don pants in bed with min A. OT Short Term Goal 3 (Week 1): Pt will perform lateral leans on BSC with S to assist with clothing management.  Skilled Therapeutic Interventions/Progress Updates:    Pt resting in bed upon arrival with wife present.  Pt engaged in BADL retraining including bathing/dressing sitting EOB and bed level for LB bathing/dressing.  Pt required min A for supine>sit EOB and steady A for sitting balance EOB.  Pt completed UB bathing/dressing tasks with multiple rest breaks before laying back into bed for LB bathing/dressing tasks.  Pt requires min A for rolling R/L to facilitate cleaning buttocks and pulling pants over hips.  Pt stated he was "worn out" after bathing/dressing tasks.  Pt remained in bed with all needs within reach.   Therapy Documentation Precautions:  Precautions Precautions: Fall Restrictions Weight Bearing Restrictions: Yes RLE Weight Bearing: Non weight bearing RLE Partial Weight Bearing Percentage or Pounds: R BKA LLE Weight Bearing: Non weight bearing LLE Partial Weight Bearing Percentage or Pounds: L BKA Pain: Pain Assessment Pain Assessment: No/denies pain  See Function Navigator for Current Functional Status.   Therapy/Group: Individual Therapy  Rich BraveLanier, Abdulkadir Emmanuel Chappell 03/08/2017, 12:53 PM

## 2017-03-08 NOTE — Progress Notes (Signed)
Physical Therapy Session Note  Patient Details  Name: Joshua [name] MRN: [id number] Date of Birth: 1940/09/10  Today's Date: 03/08/2017 PT Individual Time: [id number] PT Individual Time Calculation (min): 71 min   Short Term Goals: Week 1:  PT Short Term Goal 1 (Week 1): Pt will perform bed mobility w/ Min A  PT Short Term Goal 2 (Week 1): Pt will transfer bed<>chair w/ Mod A  PT Short Term Goal 3 (Week 1): Pt will self-propel w/c 1675' w/ supervision PT Short Term Goal 4 (Week 1): Pt will maintain static sitting balance w/ supervision   Skilled Therapeutic Interventions/Progress Updates:    Pt seated on commode with RN present, finishing toileting upon PT arrival. Pt transferred from commod>w/c using slideboard and lateral scoots with min assist to block LEs and facilitate weightshifts, increased time to complete. Pt reports he feels like his w/c veers to the R during propulsion, this PT swapped out w/c per pts request. Pt transported to gym in w/c. Pt worked on level slideboard transfers from w/c<>mat x 2 with min assist and verbal cues for techniques, therapist also blocking knees. Pt seated in w/c performed 2 x 10 LAQ for strengthening. Pt's ace wraps starting to fall off, therapist educated pt and wife about proper wrapping for limb shaping and edema control, therapist wrapped B LEs. Pt worked on w/c propulsion with emphasis on technique and energy conservation, verbal cues to "just let go" between strokes. Pt propelled w/c from gym>room x 120 ft with supervision and increased time. Pt requesting to get back in bed at end of session. Therapist asked pt to set up w/c for transfer, pt requiring cues and assist w/c for parts management. Pt transferred from w/c>bed with min assist, lateral scoot using slideboard. Pt transferred sitting>supine with supervision. Pt left supine in bed with needs in reach and wife present.   Therapy Documentation Precautions:  Precautions Precautions:  Fall Restrictions Weight Bearing Restrictions: Yes RLE Weight Bearing: Non weight bearing RLE Partial Weight Bearing Percentage or Pounds: R BKA LLE Weight Bearing: Non weight bearing LLE Partial Weight Bearing Percentage or Pounds: L BKA   See Function Navigator for Current Functional Status.   Therapy/Group: Individual Therapy  Cresenciano GenreEmily van Schagen, PT, DPT 03/08/2017, 5:05 PM

## 2017-03-08 NOTE — Progress Notes (Signed)
Patient has home CPAP bedside that he places on when ready for bed.  

## 2017-03-08 NOTE — Progress Notes (Signed)
Subjective:  Doing well, denies any SOB, edema, abd pain. No probs with PD.   Objective  Vital signs in last 24 hours: Vitals:   03/07/17 2022 03/08/17 0458 03/08/17 0500 03/08/17 1110  BP: (!) 139/52 (!) 132/47  (!) 100/54  Pulse: 91 86  (!) 110  Resp: 18 18  18   Temp: 97.6 F (36.4 C) 98.6 F (37 C)  98 F (36.7 C)  TempSrc: Oral Oral  Oral  SpO2:  100%  95%  Weight: 98.8 kg (217 lb 13 oz)  100.6 kg (221 lb 12.5 oz) 98.5 kg (217 lb 2.5 oz)  Height:       Weight change: -3.3 kg (-4.4 oz)  Physical Exam: General:  NAD  ,OX3, appropriate  Heart: RRR; No m, r, g Lungs: CTA  bilat. Abdomen: soft, non-tender., non distended,  PD cath in place. Extremities:  L BKA and new R BKA  Dressing dry/clean Dialysis Access: PD cath,  L AVF + thrill, bruit nmot developed well   Dialysis Orders: [facility] [place] [facility]. 6 exchanges/day one mid-day exchange (occurs around 9am).  2.5 liters of 2.5% per [facility] 2 liters of 2.5% for last fill and mid day drain. Dr. Denny PeonBaveja    Assessment: 1. PAD sp revision L BKA and new R BKA on 11/21 2. [place]D on [facility] in [place]. Normal BP's, will do 50% 1.5 and 50% 2.5 fluids 3. Hypokalemia - better 4. HTN/ vol - coreg low dose 5. MBD of CKD - holding phoslo for low phos; Ca ok. No vit D 6. UTI - blood cx's neg, no urine cx in system. Sp short course abx 7. DM - per primary 8. Malnutrition - alb 1.7 range. Prostat, +Nepro.    Plan - cont CCPD every 24 hrs, 1/2 1.5% and 1/2 2.5%   Vinson Moselleob Dorien Bessent MD [facility] pager [phone]   03/08/2017, 12:30 PM    Labs: Basic Metabolic Panel: Recent Labs  Lab 03/03/17 0654 03/04/17 2247 03/06/17 0205 03/07/17 0555  NA 132* 130*  --  130*  K 3.5 2.5* 3.8 3.6  CL 93* 97*  --  95*  CO2 29 25  --  23  GLUCOSE 350* 341*  --  383*  BUN 33* 37*  --  43*  CREATININE 5.55* 4.73*  --  4.71*  CALCIUM 8.4* 7.3*  --  7.5*  PHOS 3.6  --   --  3.7   Liver Function  Tests: Recent Labs  Lab 03/03/17 0654 03/07/17 0555  ALBUMIN 1.6* 1.3*   CBC: Recent Labs  Lab 03/03/17 0654 03/04/17 2247 03/06/17 0205  WBC 10.6* 12.5* 12.5*  HGB 9.0* 8.1* 8.1*  HCT 28.4* 25.3* 25.6*  MCV 95.0 91.7 94.8  PLT 313 287 300   Cardiac Enzymes: Recent Labs  Lab 03/05/17 0045 03/05/17 0953  TROPONINI 0.17* 0.15*   CBG: Recent Labs  Lab 03/07/17 0632 03/07/17 1211 03/07/17 1705 03/07/17 2014 03/08/17 1128  GLUCAP 399* 313* 192* 250* 361*    Studies/Results: No results found. Medications: . dialysis solution 1.5% low-MG/low-CA    . dialysis solution 1.5% low-MG/low-CA    . dialysis solution 2.5% low-MG/low-CA     . aspirin EC  81 mg Oral Daily  . atorvastatin  80 mg Oral q1800  . carvedilol  6.25 mg Oral QHS  . clopidogrel  75 mg Oral Daily  . [START ON 03/10/2017] darbepoetin (ARANESP) injection - NON-DIALYSIS  150 mcg Subcutaneous Q Fri-1800  . docusate sodium  100  mg Oral BID  . feeding supplement (NEPRO CARB STEADY)  237 mL Oral BID BM  . feeding supplement (PRO-STAT SUGAR FREE 64)  30 mL Oral BID  . insulin aspart  0-9 Units Subcutaneous TID WC  . insulin aspart  3 Units Subcutaneous TID WC  . insulin glargine  15 Units Subcutaneous QHS  . multivitamin  1 tablet Oral QHS  . polyethylene glycol  17 g Oral Daily

## 2017-03-08 NOTE — Progress Notes (Signed)
[place] PHYSICAL MEDICINE & REHABILITATION     PROGRESS NOTE  Subjective/Complaints:  Pt seen sitting up in bed this AM eating breakfast.  He is supposed to be on a carb modified diet, but noted to have 2 regular syrups with breakfast.  He slept well overnight.  He had a tiring day of therapies.   ROS: Denies CP, SOB, nausea, vomiting, diarrhea.  Objective: Vital Signs: Blood pressure (!) 132/47, pulse 86, temperature 98.6 F (37 C), temperature source Oral, resp. rate 18, height 5\' 9"  (1.753 m), weight 100.6 kg (221 lb 12.5 oz), SpO2 100 %. No results found. Recent Labs    03/06/17 0205  WBC 12.5*  HGB 8.1*  HCT 25.6*  PLT 300   Recent Labs    03/06/17 0205 03/07/17 0555  NA  --  130*  K 3.8 3.6  CL  --  95*  GLUCOSE  --  383*  BUN  --  43*  CREATININE  --  4.71*  CALCIUM  --  7.5*   CBG (last 3)  Recent Labs    03/07/17 1211 03/07/17 1705 03/07/17 2014  GLUCAP 313* 192* 250*    Wt Readings from Last 3 Encounters:  03/08/17 100.6 kg (221 lb 12.5 oz)  03/05/17 97.4 kg (214 lb 11.7 oz)  02/27/17 65.8 kg (145 lb)    Physical Exam:  BP (!) 132/47 (BP Location: Right Arm)   Pulse 86   Temp 98.6 F (37 C) (Oral)   Resp 18   Ht 5\' 9"  (1.753 m)   Wt 100.6 kg (221 lb 12.5 oz)   SpO2 100%   BMI 32.75 kg/m  Constitutional: He appears well-developed and well-nourished. Vitals reviewed. HENT: Normocephalic. Ulcer on bridge of nose (stable)  Eyes: EOM are normal. No discharge.  Cardiovascular: Normal rate, regular rhythm. No JVD.  Respiratory: Effort normal and breath sounds normal.  GI: Bowel sounds are normal. He exhibits no distension.  Musculoskeletal: He exhibits edema and tenderness b/l stumps, R>L.  Neurological: He is alert and oriented.  Motor: B/l UE 4+/5 proximal to distal RLE: HF 2-/5 (pain inhibition) LLE: HF 3+/5 (pain inhibition)  Skin: See above. B/l BKA with dressing c/d/i.   Psychiatric: He has a normal mood and affect. His behavior is  normal.   Assessment/Plan: 1. 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 Physiatrist is providing close team supervision and 24 hour management of active medical problems listed below. Physiatrist and rehab team continue to assess barriers to discharge/monitor patient progress toward functional and medical goals.  Function:  Bathing Bathing position   Position: Bed  Bathing parts Body parts bathed by patient: Right arm, Left arm, Chest, Abdomen Body parts bathed by helper: Right upper leg, Left upper leg, Buttocks, Front perineal area  Bathing assist        Upper Body Dressing/Undressing Upper body dressing                    Upper body assist        Lower Body Dressing/Undressing Lower body dressing   What is the patient wearing?: Pants     Pants- Performed by patient: Pull pants up/down Pants- Performed by helper: Thread/unthread left pants leg, Thread/unthread right pants leg                      Lower body assist  Toileting Toileting Toileting activity did not occur: No continent bowel/bladder event   Toileting steps completed by helper: Adjust clothing prior to toileting, Performs perineal hygiene, Adjust clothing after toileting Toileting Assistive Devices: Grab bar or rail  Toileting assist     Transfers Chair/bed transfer   Chair/bed transfer method: Lateral scoot Chair/bed transfer assist level: Maximal assist (Pt 25 - 49%/lift and lower) Chair/bed transfer assistive device: Sliding board, Armrests     Locomotion Ambulation Ambulation activity did not occur: N/A(bilateral BKA)         Wheelchair   Type: Manual(manual mostly, will transition to motorized when custom chair arrives) Max wheelchair distance: 6040' Assist Level: Supervision or verbal cues  Cognition Comprehension Comprehension assist level: Follows complex  conversation/direction with extra time/assistive device  Expression Expression assist level: Expresses complex ideas: With extra time/assistive device  Social Interaction Social Interaction assist level: Interacts appropriately 90% of the time - Needs monitoring or encouragement for participation or interaction.  Problem Solving Problem solving assist level: Solves basic problems with no assist  Memory Memory assist level: More than reasonable amount of time    Medical Problem List and Plan: 1.  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 Bilateral wound vacs discontinued 03/05/2017 per [name]  Cont CIR 2.  DVT Prophylaxis/Anticoagulation: Heparin per Nephro 3. Pain Management: Oxycodone as needed 4. Mood: Provide emotional support 5. Neuropsych: This patient is capable of making decisions on his own behalf. 6. Skin/Wound Care: Routine skin checks 7. Fluids/Electrolytes/Nutrition: Routine I&O's  8. Acute on chronic anemia. Continue Aranesp  Hb 8.1 on 11/26 9. End-stage renal disease/CPPD. Follow-up renal services 10. Diabetes mellitus peripheral neuropathy. Latest hemoglobin A1c 8.8.   Lantus insulin 10 units daily at bedtime, increased to 15U on 11/28  NovoLog 3 units 3 times a day with meals.   Check blood sugars before meals and at bedtime  Will need to be mindful as patient is very sensitive to changes.  Discussed diet with nursing, will need to ensure appropriate diet as pt is brittle at baseline 11. CAD with CABG. Continue Plavix and aspirin. No chest pain or shortness of breath 12. Diastolic congestive heart failure. Monitor for any signs of fluid overload Filed Weights   03/07/17 0808 03/07/17 2022 03/08/17 0500  Weight: 99 kg (218 lb 4.1 oz) 98.8 kg (217 lb 13 oz) 100.6 kg (221 lb 12.5 oz)  13. SIRS: Improving  Blood cultures no growth.   Monitor for any fever  Abx d/ced 11/27  Cont to monitor 14. Hypertension. Coreg 6.25 mg  daily at bedtime. Monitor for orthostasis  Relatively controlled on 11/28 15. Hyperlipidemia. Lipitor 16. Constipation. Laxative assistance 16. Leukocytosis  WBCs 12.5 on 11/26  Continue to monitor  LOS (Days) 2 A FACE TO FACE EVALUATION WAS PERFORMED  Lesleyann Fichter Karis Jubanil Joscelin Fray 03/08/2017 8:08 AM

## 2017-03-09 ENCOUNTER — Inpatient Hospital Stay (HOSPITAL_COMMUNITY): Payer: Medicare Other | Admitting: Physical Therapy

## 2017-03-09 ENCOUNTER — Other Ambulatory Visit: Payer: Self-pay

## 2017-03-09 ENCOUNTER — Inpatient Hospital Stay (HOSPITAL_COMMUNITY): Payer: Medicare Other | Admitting: Occupational Therapy

## 2017-03-09 DIAGNOSIS — N186 End stage renal disease: Secondary | ICD-10-CM

## 2017-03-09 DIAGNOSIS — R195 Other fecal abnormalities: Secondary | ICD-10-CM

## 2017-03-09 DIAGNOSIS — Z992 Dependence on renal dialysis: Secondary | ICD-10-CM

## 2017-03-09 LAB — GLUCOSE, CAPILLARY
GLUCOSE-CAPILLARY: 364 mg/dL — AB (ref 65–99)
GLUCOSE-CAPILLARY: 296 mg/dL — AB (ref 65–99)
Glucose-Capillary: 332 mg/dL — ABNORMAL HIGH (ref 65–99)
Glucose-Capillary: 378 mg/dL — ABNORMAL HIGH (ref 65–99)

## 2017-03-09 LAB — CULTURE, BLOOD (ROUTINE X 2)
CULTURE: NO GROWTH
Culture: NO GROWTH
SPECIAL REQUESTS: ADEQUATE
Special Requests: ADEQUATE

## 2017-03-09 MED ORDER — OXYCODONE HCL 5 MG PO TABS
5.0000 mg | ORAL_TABLET | ORAL | Status: DC | PRN
  Administered 2017-03-09 – 2017-03-12 (×7): 5 mg via ORAL
  Filled 2017-03-09 (×8): qty 1

## 2017-03-09 MED ORDER — CALCIUM POLYCARBOPHIL 625 MG PO TABS
625.0000 mg | ORAL_TABLET | Freq: Every day | ORAL | Status: DC
  Administered 2017-03-09 – 2017-03-11 (×3): 625 mg via ORAL
  Filled 2017-03-09 (×4): qty 1

## 2017-03-09 MED ORDER — TRAMADOL HCL 50 MG PO TABS
25.0000 mg | ORAL_TABLET | Freq: Four times a day (QID) | ORAL | Status: DC | PRN
  Administered 2017-03-09 – 2017-03-10 (×2): 25 mg via ORAL
  Filled 2017-03-09 (×2): qty 1

## 2017-03-09 NOTE — Progress Notes (Signed)
Physical Therapy Session Note  Patient Details  Name: [name] MRN: [id number] Date of Birth: [birth year]  Today's Date: 03/09/2017 PT Individual Time: 1345-1405 AND 1540-1645 PT Individual Time Calculation (min): 20 min  AND 65 min   Today's Date: 03/09/2017 PT Missed Time: 40 Minutes Missed Time Reason: Pain;Patient unwilling to participate(8/10 pain, unwilling to participate 2/2 pain and wants to keep ice on LEs )  Short Term Goals: Week 1:  PT Short Term Goal 1 (Week 1): Pt will perform bed mobility w/ Min A  PT Short Term Goal 2 (Week 1): Pt will transfer bed<>chair w/ Mod A  PT Short Term Goal 3 (Week 1): Pt will self-propel w/c 24' w/ supervision PT Short Term Goal 4 (Week 1): Pt will maintain static sitting balance w/ supervision   Skilled Therapeutic Interventions/Progress Updates:   Session 1:  Pt unwilling to perform active or OOB activity 2/2 RLE pain, 8/10 as detailed below. Has ice resting on R residual limb. Offered to assist pt in getting to w/c to reposition for comfort and pain management, however states he slides out and cannot stay upright. Discussed use of motorized w/c while at home vs manual w/c. Pt and wife report pt feels unfamiliar w/ motorized and doesn't drive it well around the home. Retrieved power w/c loaner for pt to use while admitted, explained benefits of motorized w/c for pressure relief, comfort, tolerance to upright, and more postural control. Pt continued to decline therapy this session 2/2 pain, will follow up later this afternoon.   Session 2:  Pt supine and agreeable to therapy, pain remains in RLE, but not as bad as earlier in afternoon. Transferred to EOB w/ Mod A and maintained static sitting balance w/ Min guard while PT set up motorized w/c for transfer. Transferred to w/c via slide board w/ Max A and multimodal cues for technique using UEs for lateral scoot. Oriented pt to use of motorized w/c including changing speed, tilt-in-space  functions, seat rise function, and maneuvering turns. Pt w/ Min A overall for use of w/c motorized functions and went at a self-selected slow speed 2/2 anxiety w/ using w/c. Adjusted w/c foot rests in gym to allow for knee extension at rest to prevent knee flexion contractures. Returned to room w/ supervision using motorized w/c and ended session in w/c and in care of wife, all needs met. RN made aware of w/c swap for transferring back to bed.   Therapy Documentation Precautions:  Precautions Precautions: Fall Restrictions Weight Bearing Restrictions: Yes RLE Weight Bearing: Non weight bearing RLE Partial Weight Bearing Percentage or Pounds: R BKA LLE Weight Bearing: Non weight bearing LLE Partial Weight Bearing Percentage or Pounds: L BKA General: PT Amount of Missed Time (min): 40 Minutes PT Missed Treatment Reason: Pain;Patient unwilling to participate(8/10 pain, unwilling to participate 2/2 pain and wants to keep ice on LEs ) Pain: Pain Assessment Pain Assessment: 0-10 Pain Score: 8  Faces Pain Scale: Hurts a little bit Pain Type: Surgical pain Pain Location: Leg Pain Orientation: Right Pain Descriptors / Indicators: Crushing;Discomfort Pain Onset: With Activity Pain Intervention(s): Repositioned  See Function Navigator for Current Functional Status.   Therapy/Group: Individual Therapy  Chayil Gantt K Arnette 03/09/2017, 2:25 PM

## 2017-03-09 NOTE — Progress Notes (Signed)
Social Work Patient ID: [name], male   DOB: 11-Mar-1941, 76 y.o.   MRN: [id number]   Met with pt and wife today to review team conference.  Both aware and agreeable with targeted d/c date of 12/12 and min assist w/c goals.  Pt with very flat affect and wife very quiet.  SW to continue to follow.  Alese Furniss, LCSW

## 2017-03-09 NOTE — Patient Care Conference (Signed)
Inpatient RehabilitationTeam Conference and Plan of Care Update Date: 03/08/2017   Time: 2:15 PM    Patient Name: [name]      Medical Record Number: [id number]  Date of Birth: Oct 11, 1940 Sex: Male         Room/Bed: 4M02C/4M02C-01 Payor Info: Payor: MEDICARE / Plan: MEDICARE PART A AND B / Product Type: *No Product type* /    Admitting Diagnosis: gangrene L BKA  Admit Date/Time:  03/06/2017  5:49 PM Admission Comments: No comment available   Primary Diagnosis:  <principal problem not specified> Principal Problem: <principal problem not specified>  Patient Active Problem List   Diagnosis Date Noted  . Loose stools   . Essential hypertension   . Acute combined systolic and diastolic congestive heart failure (HCC)   . Poorly controlled type 2 diabetes mellitus with peripheral neuropathy (HCC)   . S/P bilateral below knee amputation (HCC) 03/06/2017  . Constipation due to pain medication   . Coronary artery disease involving native coronary artery of native heart without angina pectoris   . Hyperlipidemia   . S/P unilateral BKA (below knee amputation), right (HCC)   . SIRS (systemic inflammatory response syndrome) (HCC) 03/05/2017  . S/P bilateral BKA (below knee amputation) (HCC) 03/01/2017  . Gangrene of right foot (HCC) 02/23/2017  . Blood in stool   . Wound dehiscence   . Rupture of operation wound   . Toe amputation status, right (HCC)   . Pressure injury of head, stage 3 (HCC)   . Poor nutrition   . Brittle diabetes (HCC)   . Adjustment disorder with depressed mood   . Hypoglycemia   . Ischemic pain of right foot   . Arterial hypotension   . Diabetes mellitus type 2 in obese (HCC)   . PVD (peripheral vascular disease) (HCC)   . Delirium   . History of left below knee amputation (HCC) 01/17/2017  . Acute blood loss anemia   . Elevated AST (SGOT) 01/09/2017  . Chest pain 01/09/2017  . NSTEMI (non-ST elevated myocardial infarction) (HCC) 01/09/2017  .  Hypokalemia 01/07/2017  . Hypomagnesemia 01/07/2017  . Left foot infection 01/06/2017  . Chronic hypotension 01/06/2017  . 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 IDDM (insulin dependent diabetes mellitus) (HCC) 01/06/2017  . Foot infection 01/06/2017  . Labile blood pressure   . Hallucination   . Benign essential HTN   . Ulcer of toe of right foot (HCC)   . Ulcer of external ear, limited to breakdown of skin (HCC)   . Chronic congestive heart failure (HCC)   . Anemia of chronic disease   . Labile blood glucose   . 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 Diabetic peripheral neuropathy (HCC)   . Debility 12/27/2016  . History of transmetatarsal amputation of left foot (HCC) 12/27/2016  . Constipation   . Chronic obstructive pulmonary disease (HCC)   . Nausea   . ESRD on dialysis (HCC)   . Leukocytosis   . Tachypnea   . FUO (fever of unknown origin)   . Encephalopathy 12/23/2016  . Sore throat 12/23/2016  . Gangrene of left foot (HCC) 11/26/2016  . PAD (peripheral artery disease) (HCC) 11/26/2016  . Sjogren's syndrome (HCC) 09/26/2015  . Uncontrolled type 2 diabetes mellitus with hyperglycemia, with long-term current use of insulin (HCC) 09/26/2015  . Hyperosmolar non-ketotic state in patient with type 2 diabetes mellitus (HCC) 09/25/2015  . Ileus (HCC) 09/22/2015  . Acute  respiratory failure with hypoxia (HCC) 09/22/2015  . Acute gouty arthritis 09/22/2015  . CAD (coronary artery disease) of artery bypass graft 09/22/2015  . Sepsis (HCC) 09/21/2015  . Elevated troponin level 09/21/2015  . ESRD on peritoneal dialysis (HCC) 09/21/2015  . CVA (cerebral infarction) 09/21/2015    Expected Discharge Date: Expected Discharge Date: 03/22/17  Team Members Present: Physician leading conference: Dr. Maryla MorrowAnkit Patel Social Worker Present: Amada JupiterLucy [name], LCSW Nurse Present: Regino SchultzeHilary Lilja, RN PT Present: Woodfin GanjaEmily Van Shagen,  PT OT Present: Perrin MalteseJames McGuire, OT SLP Present: Jackalyn LombardNicole Page, SLP PPS Coordinator present : Tora DuckMarie Noel, RN, CRRN     Current Status/Progress Goal Weekly Team Focus  Medical   Decreased functional mobility secondary to right BKA and revision of left BKA 03/01/2017.   Improve mobility, endurance, transfers, ESRD, ABLA, DM, leukocytosis  See above   Bowel/Bladder   Oliguric Bladder. wife assists patient with bedpan. Continent of bowel. Having loose stools. LbM 03/09/17  maintain b/b with mod I assist   monitor.    Swallow/Nutrition/ Hydration             ADL's   mod - max with LB self care bed level, max A slide board transfer  min A level for BSC transfers and toileting; set up bed level bathing and LB dressing  ADL retraining, sitting balance, transfers, endurance, strengthening   Mobility   Max A slide board transfers (Mod-Max A downhill), supervision w/c but short distances and very slow, Min-Mod A sitting balance  Min A transfers, supervision w/c in home environment  bed mobility, transfers, w/c mobility, endurance, sitting balance   Communication             Safety/Cognition/ Behavioral Observations            Pain   Intermittent pain to both Lower extremities managed with Oxy IR 5mg  prn. Tramadol to be added.   <4  assess pain q shift and prn.    Skin   Left BKA with sutures and dry dressing. Right BKA with staples. Dry dressing intact and changed daily. Stage 1 to sacrum with foam and offloading.   maintain skin/incisions with mod assist  assess skin q shift. dressing changes as ordered. Encourage pressure relief.       *See Care Plan and progress notes for long and short-term goals.     Barriers to Discharge  Current Status/Progress Possible Resolutions Date Resolved   Physician    Decreased caregiver support;Medical stability;Home environment access/layout;Wound Care;Lack of/limited family support;Other (comments)  PD  See above  Therapies, follow labs, PD, optimize DM  meds      Nursing  Wound Care;Medical stability;Weight bearing restrictions               PT  Inaccessible home environment;Home environment access/layout;Wound Care  Wife very involved and received a lot of EDU during last admission, has ramp               OT                  SLP                SW                Discharge Planning/Teaching Needs:  Home with wife who was providing care prior to admission, she stays here with him to provide emotional support      Team Discussion:  Wounds better overall with this admit.  Need to reinforce  appropriate diet;  Monitor labs.  Max assist overall with transfers and mod/max with self care.  Goals being set for min assist w/c level.    Revisions to Treatment Plan:  None    Continued Need for Acute Rehabilitation Level of Care: The patient requires daily medical management by a physician with specialized training in physical medicine and rehabilitation for the following conditions: Daily direction of a multidisciplinary physical rehabilitation program to ensure safe treatment while eliciting the highest outcome that is of practical value to the patient.: Yes Daily medical management of patient stability for increased activity during participation in an intensive rehabilitation regime.: Yes Daily analysis of laboratory values and/or radiology reports with any subsequent need for medication adjustment of medical intervention for : Post surgical problems;Diabetes problems;Wound care problems;Renal problems;Other  Laquia Rosano 03/09/2017, 1:59 PM

## 2017-03-09 NOTE — Progress Notes (Signed)
Occupational Therapy Session Note  Patient Details  Name: Joshua [name] MRN: [id number] Date of Birth: 17-Feb-1941  Today's Date: 03/09/2017 OT Individual Time: 6962-95281106-1202 OT Individual Time Calculation (min): 56 min    Short Term Goals: Week 1:  OT Short Term Goal 1 (Week 1): Pt will transfer to drop arm BSC with mod assist with slide board. OT Short Term Goal 2 (Week 1): Pt will don pants in bed with min A. OT Short Term Goal 3 (Week 1): Pt will perform lateral leans on BSC with S to assist with clothing management.  Skilled Therapeutic Interventions/Progress Updates:    Pt completed bathing and dressing sitting EOB during session.  Max assist needed for sitting balance during bathing secondary to frequent LOB posteriorly.  Pt transitioned to supine for rolling to remove clothing and for donning new clothing.  Lateral leans completed with max assist for balance to wash peri area.  Max assist for transfer to the wheelchair via sliding board as well.  Pt left in wheelchair with spouse and friends present and call button in reach.   Therapy Documentation Precautions:  Precautions Precautions: Fall Restrictions Weight Bearing Restrictions: Yes RLE Weight Bearing: Non weight bearing RLE Partial Weight Bearing Percentage or Pounds: R BKA LLE Weight Bearing: Non weight bearing LLE Partial Weight Bearing Percentage or Pounds: L BKA  Pain: Pain Assessment Pain Assessment: Faces Faces Pain Scale: Hurts a little bit Pain Type: Surgical pain Pain Location: Leg Pain Orientation: Right Pain Descriptors / Indicators: Crushing;Discomfort Pain Onset: With Activity Pain Intervention(s): Repositioned ADL: See Function Navigator for Current Functional Status.   Therapy/Group: Individual Therapy  Rochella Benner,Karthik OTR/L 03/09/2017, 12:55 PM

## 2017-03-09 NOTE — Progress Notes (Signed)
Social Work Patient ID: Joshua KitchenJames D Parsell, male   DOB: 10-01-1940, 76 y.o.   MRN: [id number]    Deatra InaHoyle, Baruc Tugwell R, LCSW  Social Worker  General Practice  Patient Care Conference  Signed  Date of Service:  03/09/2017  1:59 PM          Signed          [] Hide copied text  [] Hover for details   Inpatient RehabilitationTeam Conference and Plan of Care Update Date: 03/08/2017   Time: 2:15 PM      Patient Name: [name]      Medical Record Number: [id number]  Date of Birth: 10-01-1940 Sex: Male         Room/Bed: 4M02C/4M02C-01 Payor Info: Payor: MEDICARE / Plan: MEDICARE PART A AND B / Product Type: *No Product type* /     Admitting Diagnosis: gangrene L BKA  Admit Date/Time:  03/06/2017  5:49 PM Admission Comments: No comment available    Primary Diagnosis:  <principal problem not specified> Principal Problem: <principal problem not specified>       Patient Active Problem List    Diagnosis Date Noted  . Loose stools    . Essential hypertension    . Acute combined systolic and diastolic congestive heart failure (HCC)    . Poorly controlled type 2 diabetes mellitus with peripheral neuropathy (HCC)    . S/P bilateral below knee amputation (HCC) 03/06/2017  . Constipation due to pain medication    . Coronary artery disease involving native coronary artery of native heart without angina pectoris    . Hyperlipidemia    . S/P unilateral BKA (below knee amputation), right (HCC)    . SIRS (systemic inflammatory response syndrome) (HCC) 03/05/2017  . S/P bilateral BKA (below knee amputation) (HCC) 03/01/2017  . Gangrene of right foot (HCC) 02/23/2017  . Blood in stool    . Wound dehiscence    . Rupture of operation wound    . Toe amputation status, right (HCC)    . Pressure injury of head, stage 3 (HCC)    . Poor nutrition    . Brittle diabetes (HCC)    . Adjustment disorder with depressed mood    . Hypoglycemia    . Ischemic pain of right foot    . Arterial  hypotension    . Diabetes mellitus type 2 in obese (HCC)    . PVD (peripheral vascular disease) (HCC)    . Delirium    . History of left below knee amputation (HCC) 01/17/2017  . Acute blood loss anemia    . Elevated AST (SGOT) 01/09/2017  . Chest pain 01/09/2017  . NSTEMI (non-ST elevated myocardial infarction) (HCC) 01/09/2017  . Hypokalemia 01/07/2017  . Hypomagnesemia 01/07/2017  . Left foot infection 01/06/2017  . Chronic hypotension 01/06/2017  . 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 IDDM (insulin dependent diabetes mellitus) (HCC) 01/06/2017  . Foot infection 01/06/2017  . Labile blood pressure    . Hallucination    . Benign essential HTN    . Ulcer of toe of right foot (HCC)    . Ulcer of external ear, limited to breakdown of skin (HCC)    . Chronic congestive heart failure (HCC)    . Anemia of chronic disease    . Labile blood glucose    . Uncontrolled diabetes mellitus type 2 with peripheral artery disease (HCC)    . Diabetic peripheral neuropathy (HCC)    . Debility 12/27/2016  . History  of transmetatarsal amputation of left foot (HCC) 12/27/2016  . Constipation    . Chronic obstructive pulmonary disease (HCC)    . Nausea    . ESRD on dialysis (HCC)    . Leukocytosis    . Tachypnea    . FUO (fever of unknown origin)    . Encephalopathy 12/23/2016  . Sore throat 12/23/2016  . Gangrene of left foot (HCC) 11/26/2016  . PAD (peripheral artery disease) (HCC) 11/26/2016  . Sjogren's syndrome (HCC) 09/26/2015  . Uncontrolled type 2 diabetes mellitus with hyperglycemia, with long-term current use of insulin (HCC) 09/26/2015  . Hyperosmolar non-ketotic state in patient with type 2 diabetes mellitus (HCC) 09/25/2015  . Ileus (HCC) 09/22/2015  . Acute respiratory failure with hypoxia (HCC) 09/22/2015  . Acute gouty arthritis 09/22/2015  . CAD (coronary artery disease) of artery bypass graft 09/22/2015  . Sepsis (HCC) 09/21/2015  . Elevated  troponin level 09/21/2015  . ESRD on peritoneal dialysis (HCC) 09/21/2015  . CVA (cerebral infarction) 09/21/2015      Expected Discharge Date: Expected Discharge Date: 03/22/17   Team Members Present: Physician leading conference: Dr. Maryla MorrowAnkit Patel Social Worker Present: Amada JupiterLucy Elka Satterfield, LCSW Nurse Present: Regino SchultzeHilary Lilja, RN PT Present: Woodfin GanjaEmily Van Shagen, PT OT Present: Perrin MalteseJames McGuire, OT SLP Present: Jackalyn LombardNicole Page, SLP PPS Coordinator present : Tora DuckMarie Noel, RN, CRRN       Current Status/Progress Goal Weekly Team Focus  Medical     Decreased functional mobility secondary to right BKA and revision of left BKA 03/01/2017.   Improve mobility, endurance, transfers, ESRD, ABLA, DM, leukocytosis  See above   Bowel/Bladder     Oliguric Bladder. wife assists patient with bedpan. Continent of bowel. Having loose stools. LbM 03/09/17  maintain b/b with mod I assist   monitor.    Swallow/Nutrition/ Hydration               ADL's     mod - max with LB self care bed level, max A slide board transfer  min A level for BSC transfers and toileting; set up bed level bathing and LB dressing  ADL retraining, sitting balance, transfers, endurance, strengthening   Mobility     Max A slide board transfers (Mod-Max A downhill), supervision w/c but short distances and very slow, Min-Mod A sitting balance  Min A transfers, supervision w/c in home environment  bed mobility, transfers, w/c mobility, endurance, sitting balance   Communication               Safety/Cognition/ Behavioral Observations             Pain     Intermittent pain to both Lower extremities managed with Oxy IR 5mg  prn. Tramadol to be added.   <4  assess pain q shift and prn.    Skin     Left BKA with sutures and dry dressing. Right BKA with staples. Dry dressing intact and changed daily. Stage 1 to sacrum with foam and offloading.   maintain skin/incisions with mod assist  assess skin q shift. dressing changes as ordered. Encourage pressure  relief.      *See Care Plan and progress notes for long and short-term goals.      Barriers to Discharge   Current Status/Progress Possible Resolutions Date Resolved   Physician     Decreased caregiver support;Medical stability;Home environment access/layout;Wound Care;Lack of/limited family support;Other (comments)  PD  See above  Therapies, follow labs, PD, optimize DM meds      Nursing  Wound Care;Medical stability;Weight bearing restrictions             PT  Inaccessible home environment;Home environment access/layout;Wound Care  Wife very involved and received a lot of EDU during last admission, has ramp               OT                 SLP            SW              Discharge Planning/Teaching Needs:  Home with wife who was providing care prior to admission, she stays here with him to provide emotional support      Team Discussion:  Wounds better overall with this admit.  Need to reinforce appropriate diet;  Monitor labs.  Max assist overall with transfers and mod/max with self care.  Goals being set for min assist w/c level.    Revisions to Treatment Plan:  None    Continued Need for Acute Rehabilitation Level of Care: The patient requires daily medical management by a physician with specialized training in physical medicine and rehabilitation for the following conditions: Daily direction of a multidisciplinary physical rehabilitation program to ensure safe treatment while eliciting the highest outcome that is of practical value to the patient.: Yes Daily medical management of patient stability for increased activity during participation in an intensive rehabilitation regime.: Yes Daily analysis of laboratory values and/or radiology reports with any subsequent need for medication adjustment of medical intervention for : Post surgical problems;Diabetes problems;Wound care problems;Renal problems;Other   Jagdeep Ancheta 03/09/2017, 1:59 PM

## 2017-03-09 NOTE — Progress Notes (Signed)
Subjective:  Tolerated CCPD last night , cos R leg discomfort  Better after Ace wrapped removed "it was too tight "per pt.  Objective Vital signs in last 24 hours: Vitals:   03/08/17 1537 03/08/17 1910 03/08/17 2006 03/09/17 0500  BP: 100/80 122/60 118/68 (!) 137/57  Pulse: 78 (!) 104 (!) 110 79  Resp:  18 18 18   Temp:  98.4 F (36.9 C)    TempSrc:  Oral    SpO2: 97% 95% 92% 99%  Weight:  99.9 kg (220 lb 3.8 oz)  101.2 kg (223 lb 1.7 oz)  Height:       Weight change: -0.5 kg (-1.6 oz)  Physical Exam: General:  Alert , NAD  ,OX3 appropriate  Heart: RRR; No m, r, g Lungs: CTA  bilat. Abdomen: soft, non-tender., non distended,  PD cath in place. Extremities:  L BKA and new R BKA  Dressing dry/clean with Ace wrap off  Dialysis Access: PD cath,  L AVF + thrill, bruit not developed well   Dialysis Orders: Center: [place] [facility]. 6 exchanges/day one mid-day exchange (occurs around 9am).  2.5 liters of 2.5% per CCPD 2 liters of 2.5% for last fill and mid day drain. Dr. Denny PeonBaveja    Problem/Plan: 1. 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 Hypokalemia - better 3.8 and 3.6  4. HTN/ vol - coreg 6.25mg  hs  5. MBD of CKD - holding phoslo for lowish/ controlled  Phos = 3.6/3.7 ;  Correc Ca= 8.9  No vit D 6. UTI - blood cx's neg, no urine cx in system. Sp short course abx 7. DM - per primary 8. Malnutrition - alb 1.3   Prostat, +Nepro.  9. Anemia of CKD - getting darbe 150/wk here, Hb 8-9 range   Lenny Pastelavid Zeyfang, PA-C [facility] Bee[facility] [phone] 03/09/2017,1:57 PM  LOS: 3 days   Pt seen, examined and agree w A/P as above.  Vinson Moselleob Nyra Anspaugh MD [facility] pager [phone]   03/09/2017, 2:08 PM    Labs: Basic Metabolic Panel: Recent Labs  Lab 03/03/17 0654 03/04/17 2247 03/06/17 0205  03/07/17 0555  NA 132* 130*  --  130*  K 3.5 2.5* 3.8 3.6  CL 93* 97*  --  95*  CO2 29 25  --  23  GLUCOSE 350* 341*  --  383*  BUN 33* 37*  --  43*  CREATININE 5.55* 4.73*  --  4.71*  CALCIUM 8.4* 7.3*  --  7.5*  PHOS 3.6  --   --  3.7   Liver Function Tests: Recent Labs  Lab 03/03/17 0654 03/07/17 0555  ALBUMIN 1.6* 1.3*   No results for input(s): LIPASE, AMYLASE in the last 168 hours. No results for input(s): AMMONIA in the last 168 hours. CBC: Recent Labs  Lab 03/03/17 0654 03/04/17 2247 03/06/17 0205  WBC 10.6* 12.5* 12.5*  HGB 9.0* 8.1* 8.1*  HCT 28.4* 25.3* 25.6*  MCV 95.0 91.7 94.8  PLT 313 287 300   Cardiac Enzymes: Recent Labs  Lab 03/05/17 0045 03/05/17 0953  TROPONINI 0.17* 0.15*   CBG: Recent Labs  Lab 03/08/17 1128 03/08/17 1707 03/08/17 2020 03/09/17 0710 03/09/17 1140  GLUCAP 361* 147* 183* 364* 296*    Studies/Results: No results found. Medications: . dialysis solution 1.5% low-MG/low-CA    .  dialysis solution 2.5% low-MG/low-CA     . aspirin EC  81 mg Oral Daily  . atorvastatin  80 mg Oral q1800  . carvedilol  6.25 mg Oral QHS  . clopidogrel  75 mg Oral Daily  . [START ON 03/10/2017] darbepoetin (ARANESP) injection - NON-DIALYSIS  150 mcg Subcutaneous Q Fri-1800  . docusate sodium  100 mg Oral BID  . feeding supplement (NEPRO CARB STEADY)  237 mL Oral BID BM  . feeding supplement (PRO-STAT SUGAR FREE 64)  30 mL Oral BID  . gentamicin cream  1 application Topical Daily  . insulin aspart  0-9 Units Subcutaneous TID WC  . insulin aspart  3 Units Subcutaneous TID WC  . insulin glargine  15 Units Subcutaneous QHS  . multivitamin  1 tablet Oral QHS  . polycarbophil  625 mg Oral Daily  . polyethylene glycol  17 g Oral Daily

## 2017-03-09 NOTE — Progress Notes (Signed)
Patient has home CPAP at bedside and doesn't need assistance with applying.

## 2017-03-09 NOTE — Progress Notes (Signed)
[facility] PHYSICAL MEDICINE & REHABILITATION     PROGRESS NOTE  Subjective/Complaints:  Pt seen laying in bed this AM.  He slept well overnight.  Per wife, he had a good day of therapies.  Today, he has orange juice on his trey.   ROS: Denies CP, SOB, nausea, vomiting  Objective: Vital Signs: Blood pressure (!) 137/57, pulse 79, temperature 98.4 F (36.9 C), temperature source Oral, resp. rate 18, height 5\' 9"  (1.753 m), weight 101.2 kg (223 lb 1.7 oz), SpO2 99 %. No results found. No results for input(s): WBC, HGB, HCT, PLT in the last 72 hours. Recent Labs    03/07/17 0555  NA 130*  K 3.6  CL 95*  GLUCOSE 383*  BUN 43*  CREATININE 4.71*  CALCIUM 7.5*   CBG (last 3)  Recent Labs    03/08/17 1707 03/08/17 2020 03/09/17 0710  GLUCAP 147* 183* 364*    Wt Readings from Last 3 Encounters:  03/09/17 101.2 kg (223 lb 1.7 oz)  03/05/17 97.4 kg (214 lb 11.7 oz)  02/27/17 65.8 kg (145 lb)    Physical Exam:  BP (!) 137/57 (BP Location: Right Arm)   Pulse 79   Temp 98.4 F (36.9 C) (Oral)   Resp 18   Ht 5\' 9"  (1.753 m)   Wt 101.2 kg (223 lb 1.7 oz)   SpO2 99%   BMI 32.95 kg/m  Constitutional: He appears well-developed and well-nourished. Vitals reviewed. HENT: Normocephalic. Ulcer on bridge of nose (stable)  Eyes: EOM are normal. No discharge.  Cardiovascular: RRR. No JVD.  Respiratory: Effort normal and breath sounds normal.  GI: Bowel sounds are normal. He exhibits no distension.  Musculoskeletal: He exhibits edema and tenderness b/l stumps, R>L.  Neurological: He is alert and oriented.  Motor: B/l UE 4+/5 proximal to distal RLE: HF 2-/5 (pain inhibition, stable) LLE: HF 3+/5 (pain inhibition, stable)  Skin: See above. B/l BKA with dressing c/d/i.   Psychiatric: He has a normal mood and affect. His behavior is normal.   Assessment/Plan: 1. 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 Physiatrist is providing close team supervision and 24 hour management of active medical problems listed below. Physiatrist and rehab team continue to assess barriers to discharge/monitor patient progress toward functional and medical goals.  Function:  Bathing Bathing position   Position: Sitting EOB(Bed level for buttocks)  Bathing parts Body parts bathed by patient: Right arm, Left arm, Chest, Abdomen Body parts bathed by helper: Front perineal area, Buttocks, Right upper leg, Left upper leg  Bathing assist        Upper Body Dressing/Undressing Upper body dressing   What is the patient wearing?: Pull over shirt/dress     Pull over shirt/dress - Perfomed by patient: Thread/unthread right sleeve, Thread/unthread left sleeve, Put head through opening Pull over shirt/dress - Perfomed by helper: Pull shirt over trunk        Upper body assist Assist Level: Touching or steadying assistance(Pt > 75%)      Lower Body Dressing/Undressing Lower body dressing   What is the patient wearing?: Pants     Pants- Performed by patient: Pull pants up/down Pants- Performed by helper: Thread/unthread left pants leg, Thread/unthread right pants leg, Pull pants up/down                      Lower body assist  Toileting Toileting Toileting activity did not occur: No continent bowel/bladder event   Toileting steps completed by helper: Adjust clothing prior to toileting, Performs perineal hygiene, Adjust clothing after toileting Toileting Assistive Devices: Grab bar or rail  Toileting assist Assist level: Touching or steadying assistance (Pt.75%)   Transfers Chair/bed transfer   Chair/bed transfer method: Lateral scoot Chair/bed transfer assist level: Touching or steadying assistance (Pt > 75%) Chair/bed transfer assistive device: Sliding board, Armrests     Locomotion Ambulation Ambulation activity did not occur: N/A(bilateral BKA)          Wheelchair   Type: Manual Max wheelchair distance: 120 ft Assist Level: Supervision or verbal cues  Cognition Comprehension Comprehension assist level: Follows complex conversation/direction with extra time/assistive device  Expression Expression assist level: Expresses complex ideas: With extra time/assistive device  Social Interaction Social Interaction assist level: Interacts appropriately 90% of the time - Needs monitoring or encouragement for participation or interaction.  Problem Solving Problem solving assist level: Solves basic problems with no assist  Memory Memory assist level: More than reasonable amount of time    Medical Problem List and Plan: 1.  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 Bilateral wound vacs discontinued 03/05/2017 per [name]  Cont CIR 2.  DVT Prophylaxis/Anticoagulation: Heparin per Nephro 3. Pain Management: Oxycodone as needed 4. Mood: Provide emotional support 5. Neuropsych: This patient is capable of making decisions on his own behalf. 6. Skin/Wound Care: Routine skin checks 7. Fluids/Electrolytes/Nutrition: Routine I&O's  8. Acute on chronic anemia. Continue Aranesp  Hb 8.1 on 11/26 9. End-stage renal disease/CPPD. 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 Latest hemoglobin A1c 8.8.   Lantus insulin 10 units daily at bedtime, increased to 15U on 11/28  NovoLog 3 units 3 times a day with meals.   Check blood sugars before meals and at bedtime  Will need to be mindful as patient is extremely sensitive to changes.  Discussed diet with nursing again, will need to ensure appropriate diet as pt is brittle at baseline. Today, pt with orange juice on trey.   11. CAD with CABG. Continue Plavix and aspirin. No chest pain or shortness of breath 12. Diastolic congestive heart failure. Monitor for any signs of fluid overload Filed Weights   03/08/17 1110 03/08/17  1910 03/09/17 0500  Weight: 98.5 kg (217 lb 2.5 oz) 99.9 kg (220 lb 3.8 oz) 101.2 kg (223 lb 1.7 oz)  13. SIRS: Improving  Blood cultures no growth.   Monitor for any fever  Abx d/ced 11/27  Cont to monitor 14. Hypertension. Coreg 6.25 mg daily at bedtime. Monitor for orthostasis  Relatively controlled on 11/29 15. Hyperlipidemia. Lipitor 16. Constipation, now with loose stools   Laxative assistance prn   Recently on abx  Fibercon started on 11/29 16. Leukocytosis  WBCs 12.5 on 11/26   Labs per Neprho  Continue to monitor  LOS (Days) 3 A FACE TO FACE EVALUATION WAS PERFORMED  Ankit Karis Jubanil Patel 03/09/2017 8:14 AM

## 2017-03-10 ENCOUNTER — Inpatient Hospital Stay (HOSPITAL_COMMUNITY): Payer: Medicare Other | Admitting: Physical Therapy

## 2017-03-10 ENCOUNTER — Inpatient Hospital Stay (HOSPITAL_COMMUNITY): Payer: Medicare Other | Admitting: Occupational Therapy

## 2017-03-10 LAB — CBC
HEMATOCRIT: 24.8 % — AB (ref 39.0–52.0)
HEMOGLOBIN: 8 g/dL — AB (ref 13.0–17.0)
MCH: 30.3 pg (ref 26.0–34.0)
MCHC: 32.3 g/dL (ref 30.0–36.0)
MCV: 93.9 fL (ref 78.0–100.0)
Platelets: 355 10*3/uL (ref 150–400)
RBC: 2.64 MIL/uL — AB (ref 4.22–5.81)
RDW: 16 % — ABNORMAL HIGH (ref 11.5–15.5)
WBC: 10 10*3/uL (ref 4.0–10.5)

## 2017-03-10 LAB — RENAL FUNCTION PANEL
Albumin: 1.4 g/dL — ABNORMAL LOW (ref 3.5–5.0)
Anion gap: 11 (ref 5–15)
BUN: 49 mg/dL — ABNORMAL HIGH (ref 6–20)
CHLORIDE: 93 mmol/L — AB (ref 101–111)
CO2: 26 mmol/L (ref 22–32)
Calcium: 7.8 mg/dL — ABNORMAL LOW (ref 8.9–10.3)
Creatinine, Ser: 5.06 mg/dL — ABNORMAL HIGH (ref 0.61–1.24)
GFR, EST AFRICAN AMERICAN: 12 mL/min — AB (ref >60)
GFR, EST NON AFRICAN AMERICAN: 10 mL/min — AB (ref >60)
Glucose, Bld: 328 mg/dL — ABNORMAL HIGH (ref 65–99)
POTASSIUM: 2.6 mmol/L — AB (ref 3.5–5.1)
Phosphorus: 3.8 mg/dL (ref 2.5–4.6)
Sodium: 130 mmol/L — ABNORMAL LOW (ref 135–145)

## 2017-03-10 LAB — GLUCOSE, RANDOM: Glucose, Bld: 447 mg/dL — ABNORMAL HIGH (ref 65–99)

## 2017-03-10 LAB — GLUCOSE, CAPILLARY
Glucose-Capillary: 463 mg/dL — ABNORMAL HIGH (ref 65–99)
Glucose-Capillary: 429 mg/dL — ABNORMAL HIGH (ref 65–99)
Glucose-Capillary: 319 mg/dL — ABNORMAL HIGH (ref 65–99)
Glucose-Capillary: 220 mg/dL — ABNORMAL HIGH (ref 65–99)
Glucose-Capillary: 402 mg/dL — ABNORMAL HIGH (ref 65–99)

## 2017-03-10 MED ORDER — GENTAMICIN SULFATE 0.1 % EX CREA
1.0000 "application " | TOPICAL_CREAM | Freq: Every day | CUTANEOUS | Status: DC
  Administered 2017-03-10 – 2017-03-11 (×3): 1 via TOPICAL
  Filled 2017-03-10: qty 15

## 2017-03-10 MED ORDER — DELFLEX-LC/2.5% DEXTROSE 394 MOSM/L IP SOLN
INTRAPERITONEAL | Status: DC
  Administered 2017-03-10: 19:00:00 via INTRAPERITONEAL
  Administered 2017-03-11: 5000 mL via INTRAPERITONEAL

## 2017-03-10 MED ORDER — DELFLEX-LC/4.25% DEXTROSE 483 MOSM/L IP SOLN
INTRAPERITONEAL | Status: DC
  Administered 2017-03-10: 19:00:00 via INTRAPERITONEAL
  Administered 2017-03-11: 5000 mL via INTRAPERITONEAL

## 2017-03-10 MED ORDER — INSULIN GLARGINE 100 UNIT/ML ~~LOC~~ SOLN
20.0000 [IU] | Freq: Every day | SUBCUTANEOUS | Status: DC
  Administered 2017-03-10: 20 [IU] via SUBCUTANEOUS
  Filled 2017-03-10: qty 0.2

## 2017-03-10 MED ORDER — GERHARDT'S BUTT CREAM
TOPICAL_CREAM | Freq: Two times a day (BID) | CUTANEOUS | Status: DC
Start: 2017-03-10 — End: 2017-03-12
  Administered 2017-03-10 – 2017-03-12 (×4): via TOPICAL
  Filled 2017-03-10: qty 1

## 2017-03-10 MED ORDER — HEPARIN 1000 UNIT/ML FOR PERITONEAL DIALYSIS
2500.0000 [IU] | INTRAMUSCULAR | Status: DC | PRN
  Filled 2017-03-10: qty 2.5

## 2017-03-10 MED ORDER — INSULIN ASPART 100 UNIT/ML ~~LOC~~ SOLN
10.0000 [IU] | Freq: Once | SUBCUTANEOUS | Status: AC
  Administered 2017-03-10: 10 [IU] via SUBCUTANEOUS

## 2017-03-10 NOTE — Progress Notes (Addendum)
Subjective:  Several questions they have, the weights seem off, 20lb higher than his usual weights.  They would like to know what his labs are.  His urine is "thick", not clear.  No dysuria.  Afebrile.  No chills or abd / back pain.   Objective Vital signs in last 24 hours: Vitals:   03/09/17 0500 03/09/17 1434 03/09/17 1735 03/10/17 0504  BP: (!) 137/57 130/61 (!) 128/53 131/69  Pulse: 79 (!) 115 93 91  Resp: 18  18 16   Temp:   98.2 F (36.8 C) 98.4 F (36.9 C)  TempSrc:   Oral Oral  SpO2: 99%  98% 100%  Weight: 101.2 kg (223 lb 1.7 oz)     Height:       Weight change:   Physical Exam: General:  Alert , NAD  ,OX3 appropriate  Heart: RRR; No m, r, g Lungs: CTA  bilat. Abdomen: soft, non-tender., non distended,  PD cath in place. Extremities:  L BKA and new R BKA  Dressing dry/clean with Ace wrap off . Has 1-2+ nonpitting edema of hips/ abd pannus Dialysis Access: PD cath,  L AVF + thrill, bruit not developed well   Dialysis Orders: Center: [place] [facility]. 6 exchanges/day one mid-day exchange (occurs around 9am).  2.5 liters of 2.5% per CCPD 2 liters of 2.5% for last fill and mid day drain. Dr. Denny PeonBaveja.   -baseline weight for him pre amputation was around 190lbs -home BP: coreg 6.25 mg qhs (only med)    Problem/Plan: 1. 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 [place] [facility]. Will get labs today.  3. Volume - bed wts are off most likely, get hoyer wt. Does have edema though, will ^UF , change to 4.25% and 2.5% bags tonight.  4. Hypokalemia - may need standing dose daily KCl 5. HTN/ vol - coreg 6.25mg  hs. BP's look good.   6. MBD of CKD - holding phoslo for lowish/ controlled  Phos = 3.6/3.7 ;  Correc Ca= 8.9  No vit D 7. UTI - blood cx's neg, no urine cx in system. Sp short course abx. They are still worried about his urine appearance. Have ordered UA/ culture.  8. DM - per primary 9. Malnutrition - alb 1.3   Prostat,  +Nepro.  10. Anemia of CKD - getting darbe 150/wk here, Hb 8-9 range    Vinson Moselleob Dondrell Loudermilk MD [facility] pager [phone]   03/10/2017, 11:28 AM    Labs: Basic Metabolic Panel: Recent Labs  Lab 03/04/17 2247 03/06/17 0205 03/07/17 0555 03/10/17 0727  NA 130*  --  130*  --   K 2.5* 3.8 3.6  --   CL 97*  --  95*  --   CO2 25  --  23  --   GLUCOSE 341*  --  383* 447*  BUN 37*  --  43*  --   CREATININE 4.73*  --  4.71*  --   CALCIUM 7.3*  --  7.5*  --   PHOS  --   --  3.7  --    Liver Function Tests: Recent Labs  Lab 03/07/17 0555  ALBUMIN 1.3*   No results for input(s): LIPASE, AMYLASE in the last 168 hours. No results for input(s): AMMONIA in the last 168 hours. CBC: Recent Labs  Lab 03/04/17 2247 03/06/17 0205  WBC 12.5* 12.5*  HGB 8.1* 8.1*  HCT 25.3* 25.6*  MCV 91.7  94.8  PLT 287 300   Cardiac Enzymes: Recent Labs  Lab 03/05/17 0045 03/05/17 0953  TROPONINI 0.17* 0.15*   CBG: Recent Labs  Lab 03/09/17 1140 03/09/17 1646 03/09/17 2119 03/10/17 0651 03/10/17 0900  GLUCAP 296* 332* 378* 463* 429*    Studies/Results: No results found. Medications: . dialysis solution 1.5% low-MG/low-CA     . aspirin EC  81 mg Oral Daily  . atorvastatin  80 mg Oral q1800  . carvedilol  6.25 mg Oral QHS  . clopidogrel  75 mg Oral Daily  . darbepoetin (ARANESP) injection - NON-DIALYSIS  150 mcg Subcutaneous Q Fri-1800  . docusate sodium  100 mg Oral BID  . feeding supplement (NEPRO CARB STEADY)  237 mL Oral BID BM  . feeding supplement (PRO-STAT SUGAR FREE 64)  30 mL Oral BID  . insulin aspart  0-9 Units Subcutaneous TID WC  . insulin aspart  3 Units Subcutaneous TID WC  . insulin glargine  20 Units Subcutaneous QHS  . multivitamin  1 tablet Oral QHS  . polycarbophil  625 mg Oral Daily  . polyethylene glycol  17 g Oral Daily

## 2017-03-10 NOTE — Progress Notes (Signed)
Per OT patient scored 15 on BIM.  Oriented to year, month and day.  Immediate recall 3/3 and delayed recall 3/3.  Not documented on OT evaluation.

## 2017-03-10 NOTE — Progress Notes (Signed)
Occupational Therapy Session Note  Patient Details  Name: Joshua [name] MRN: [id number] Date of Birth: 29-Jun-1940  Today's Date: 03/10/2017 OT Individual Time: 0902-1004 OT Individual Time Calculation (min): 62 min    Short Term Goals: Week 1:  OT Short Term Goal 1 (Week 1): Pt will transfer to drop arm BSC with mod assist with slide board. OT Short Term Goal 2 (Week 1): Pt will don pants in bed with min A. OT Short Term Goal 3 (Week 1): Pt will perform lateral leans on BSC with S to assist with clothing management.  Skilled Therapeutic Interventions/Progress Updates:    Pt completed bathing and dressing sit to supine during session.  He was able to maintain sitting balance EOB with close supervision during UB selfcare, but still demonstrates posterior bias.  He was able to transition to supine with rolling with min assist for removing dirty LB clothing, washing peri area, and donning new underpants and pants over hips.  Finished session with transition to the power chair with use of the sliding board.  Min assist for completion of transfer.  Pt left in chair with call button and phone in reach.    Therapy Documentation Precautions:  Precautions Precautions: Fall Restrictions Weight Bearing Restrictions: Yes RLE Weight Bearing: Non weight bearing RLE Partial Weight Bearing Percentage or Pounds: R BKA LLE Weight Bearing: Non weight bearing LLE Partial Weight Bearing Percentage or Pounds: L BKA    Pain: Pain Assessment Pain Assessment: Faces Pain Type: Acute pain Pain Location: Leg Pain Orientation: Right Pain Onset: With Activity Pain Intervention(s): Medication (See eMAR);Repositioned ADL: See Function Navigator for Current Functional Status.   Therapy/Group: Individual Therapy  Tamieka Rancourt,Lelan OTR/L 03/10/2017, 12:22 PM

## 2017-03-10 NOTE — Progress Notes (Signed)
Physical Therapy Session Note  Patient Details  Name: [name] MRN: [id number] Date of Birth: 1940/07/23  Today's Date: 03/10/2017 PT Individual Time: 1115-12 AND 1550-1645 PT Individual Time Calculation (min): 45 min AND 55 min   Short Term Goals: Week 1:  PT Short Term Goal 1 (Week 1): Pt will perform bed mobility w/ Min A  PT Short Term Goal 2 (Week 1): Pt will transfer bed<>chair w/ Mod A  PT Short Term Goal 3 (Week 1): Pt will self-propel w/c 47' w/ supervision PT Short Term Goal 4 (Week 1): Pt will maintain static sitting balance w/ supervision   Skilled Therapeutic Interventions/Progress Updates:   Session 1:  Pt in w/c upon arrival and agreeable to therapy, no c/o pain. Focused on w/c mobility this session while pt self-propelled motorized w/c w/ supervision and frequent cues for use of functions. Practiced maneuvering in home environment w/ mod assist 2/2 cues required for successful navigation. Returned to room and educated pt in using tilt-in-space and reclining functions to assist w/ pressure relief. Pt verbalized understanding. Transferred to EOB via slide board transfer w/ Min assist utilizing bed functions and ended session in supine and in care of wife, all needs met.   Session 2:  Pt in supine upon arrival and agreeable to therapy, c/o discomfort in buttocks. Discussed importance turning in bed regularly and utilizing bed and/or w/c functions for pressure relief. Per chart review, pt does have pressure injury to R buttocks. Requested air mattress for pressure relief from RN. Transferred to EOB and to w/c via slide board w/ Mod assist. Supervision power w/c mobility to/from gym. Transferred to/from mat w/ Min assist for even level transfers and worked on sitting balance on mat. Emphasized anterior weight shifting and maintaining upright safely w/ anterior weight shifts during UE reaching tasks. Increased postural control this session noted w/ no LOB posteriorly. Returned  to w/c and returned to room. Reinforced education of using w/c functions for pressure relief multiple times per hour. Both wife and pt verbalized understanding and in agreement. Ended session in w/c, call bell within reach and all needs met.   Therapy Documentation Precautions:  Precautions Precautions: Fall Restrictions Weight Bearing Restrictions: Yes RLE Weight Bearing: Non weight bearing RLE Partial Weight Bearing Percentage or Pounds: R BKA LLE Weight Bearing: Non weight bearing LLE Partial Weight Bearing Percentage or Pounds: L BKA Vital Signs: Therapy Vitals Temp: 98.6 F (37 C) Temp Source: Oral Pulse Rate: (!) 110 Resp: 20 BP: 126/69 Patient Position (if appropriate): Lying Oxygen Therapy SpO2: 99 % O2 Device: Not Delivered Pulse Oximetry Type: Intermittent Pain: Pain Assessment Pain Assessment: No/denies pain  See Function Navigator for Current Functional Status.   Therapy/Group: Individual Therapy  Samier Jaco K Arnette 03/10/2017, 8:31 PM

## 2017-03-10 NOTE — Plan of Care (Signed)
Pt currently on dialysis Denies pain No void yet this shift No BM Pt is continent of both B/B

## 2017-03-10 NOTE — Progress Notes (Signed)
[place] PHYSICAL MEDICINE & REHABILITATION     PROGRESS NOTE  Subjective/Complaints:  Slept ok, likes his CPAP mask  ROS: Denies CP, SOB, nausea, vomiting  Objective: Vital Signs: Blood pressure 131/69, pulse 91, temperature 98.4 F (36.9 C), temperature source Oral, resp. rate 16, height 5\' 9"  (1.753 m), weight 101.2 kg (223 lb 1.7 oz), SpO2 100 %. No results found. No results for input(s): WBC, HGB, HCT, PLT in the last 72 hours. No results for input(s): NA, K, CL, GLUCOSE, BUN, CREATININE, CALCIUM in the last 72 hours.  Invalid input(s): CO CBG (last 3)  Recent Labs    03/09/17 1140 03/09/17 1646 03/09/17 2119  GLUCAP 296* 332* 378*    Wt Readings from Last 3 Encounters:  03/09/17 101.2 kg (223 lb 1.7 oz)  03/05/17 97.4 kg (214 lb 11.7 oz)  02/27/17 65.8 kg (145 lb)    Physical Exam:  BP 131/69 (BP Location: Right Arm)   Pulse 91   Temp 98.4 F (36.9 C) (Oral)   Resp 16   Ht 5\' 9"  (1.753 m)   Wt 101.2 kg (223 lb 1.7 oz)   SpO2 100%   BMI 32.95 kg/m  Constitutional: He appears well-developed and well-nourished. Vitals reviewed. HENT: Normocephalic. Ulcer on bridge of nose (stable)  Eyes: EOM are normal. No discharge.  Cardiovascular: RRR. No JVD.  Respiratory: Effort normal and breath sounds normal.  GI: Bowel sounds are normal. He exhibits no distension.  Musculoskeletal: He exhibits edema and tenderness b/l stumps, R>L.  Neurological: He is alert and oriented.  Motor: B/l UE 4+/5 proximal to distal RLE: HF 2-/5 (pain inhibition, stable) LLE: HF 3+/5 (pain inhibition, stable)  Skin: See above. B/l BKA with dressing c/d/i.   Psychiatric: He has a normal mood and affect. His behavior is normal.   Assessment/Plan: 1. 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 Physiatrist is providing close team supervision and 24 hour management of active medical  problems listed below. Physiatrist and rehab team continue to assess barriers to discharge/monitor patient progress toward functional and medical goals.  Function:  Bathing Bathing position   Position: Sitting EOB  Bathing parts Body parts bathed by patient: Right arm, Left arm, Chest, Abdomen, Front perineal area, Buttocks, Right upper leg, Left upper leg Body parts bathed by helper: Front perineal area, Buttocks, Right upper leg, Left upper leg  Bathing assist Assist Level: Touching or steadying assistance(Pt > 75%)   Set up : (50%, moderate assist)  Upper Body Dressing/Undressing Upper body dressing   What is the patient wearing?: Pull over shirt/dress     Pull over shirt/dress - Perfomed by patient: Thread/unthread right sleeve, Thread/unthread left sleeve, Put head through opening, Pull shirt over trunk Pull over shirt/dress - Perfomed by helper: Pull shirt over trunk        Upper body assist Assist Level: Touching or steadying assistance(Pt > 75%)      Lower Body Dressing/Undressing Lower body dressing   What is the patient wearing?: Pants, Underwear Underwear - Performed by patient: Thread/unthread left underwear leg, Thread/unthread right underwear leg Underwear - Performed by helper: Pull underwear up/down Pants- Performed by patient: Thread/unthread left pants leg, Thread/unthread right pants leg Pants- Performed by helper: Pull pants up/down                      Lower body assist  Toileting Toileting Toileting activity did not occur: No continent bowel/bladder event   Toileting steps completed by helper: Adjust clothing prior to toileting, Performs perineal hygiene, Adjust clothing after toileting Toileting Assistive Devices: Grab bar or rail  Toileting assist Assist level: Touching or steadying assistance (Pt.75%)   Transfers Chair/bed transfer   Chair/bed transfer method: Lateral scoot Chair/bed transfer assist level: Maximal assist (Pt 25 -  49%/lift and lower) Chair/bed transfer assistive device: Sliding board     Locomotion Ambulation Ambulation activity did not occur: N/A         Wheelchair   Type: Motorized Max wheelchair distance: 150' Assist Level: Supervision or verbal cues  Cognition Comprehension Comprehension assist level: Follows basic conversation/direction with no assist  Expression Expression assist level: Expresses basic needs/ideas: With no assist  Social Interaction Social Interaction assist level: Interacts appropriately 75 - 89% of the time - Needs redirection for appropriate language or to initiate interaction.  Problem Solving Problem solving assist level: Solves basic problems with no assist  Memory Memory assist level: Recognizes or recalls 75 - 89% of the time/requires cueing 10 - 24% of the time    Medical Problem List and Plan: 1.  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 Bilateral wound vacs discontinued 03/05/2017 per [name]  Cont CIR PT, OT 2.  DVT Prophylaxis/Anticoagulation: Heparin per Nephro 3. Pain Management: Oxycodone as needed 4. Mood: Provide emotional support 5. Neuropsych: This patient is capable of making decisions on his own behalf. 6. Skin/Wound Care: Routine skin checks 7. Fluids/Electrolytes/Nutrition: Routine I&O's  8. Acute on chronic anemia. Continue Aranesp  Hb 8.1 on 11/26 9. End-stage renal disease/CPPD. 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 Latest hemoglobin A1c 8.8.   Lantus insulin 10 units daily at bedtime, increased to 20U on 11/30  NovoLog 3 units 3 times a day with meals.   Check blood sugars before meals and at bedtime  Will need to be mindful as patient is extremely sensitive to changes.   11. CAD with CABG. Continue Plavix and aspirin. No chest pain or shortness of breath 12. Diastolic congestive heart failure. Monitor for any signs of fluid overload Filed  Weights   03/08/17 1110 03/08/17 1910 03/09/17 0500  Weight: 98.5 kg (217 lb 2.5 oz) 99.9 kg (220 lb 3.8 oz) 101.2 kg (223 lb 1.7 oz)  13. H/O SIRS:  Blood cultures no growth.   Afebrile  Abx d/ced 11/27  Cont to monitor 14. Hypertension. Coreg 6.25 mg daily at bedtime. Monitor for orthostasis  Relatively controlled on 11/29 15. Hyperlipidemia. Lipitor 16. Constipation, now with loose stools   Laxative assistance prn   Recently on abx  Fibercon started on 11/29 16. Leukocytosis- no apparent source  WBCs 12.5 on 11/26   Labs per Nephro  Continue to monitor  LOS (Days) 4 A FACE TO FACE EVALUATION WAS PERFORMED  [name] 03/10/2017 6:39 AM

## 2017-03-10 NOTE — Significant Event (Signed)
CRITICAL VALUE ALERT  Critical Value:  2.6 K+  Date & Time Notied:  03/10/2017  1315  Provider [facility]  Orders Received/Actions taken: Made aware

## 2017-03-10 NOTE — Progress Notes (Signed)
Occupational Therapy Session Note  Patient Details  Name: Joshua [name] MRN: [id number] Date of Birth: [birth year]  Today's Date: 03/10/2017 OT Individual Time: [id number] OT Individual Time Calculation (min): 28 min    Short Term Goals: Week 1:  OT Short Term Goal 1 (Week 1): Pt will transfer to drop arm BSC with mod assist with slide board. OT Short Term Goal 2 (Week 1): Pt will don pants in bed with min A. OT Short Term Goal 3 (Week 1): Pt will perform lateral leans on BSC with S to assist with clothing management.  Skilled Therapeutic Interventions/Progress Updates:    Pt completed rolling in the bed without use of the grab bars, emphasizing activation of the abdominals to assist with rolling side to side.  Min assist needed for rolling to both sides.  Also worked on transition from sidelying to sit with mod assist, progressing to min assist.  Finished session with pt laying back down and in sidelying in order to provide pressure relief.   Pt's spouse present in room with call button and phone in reach.    Therapy Documentation Precautions:  Precautions Precautions: Fall Restrictions Weight Bearing Restrictions: Yes RLE Weight Bearing: Non weight bearing RLE Partial Weight Bearing Percentage or Pounds: R BKA LLE Weight Bearing: Non weight bearing LLE Partial Weight Bearing Percentage or Pounds: L BKA  Pain: Pain Assessment Pain Assessment: No/denies pain ADL: See Function Navigator for Current Functional Status.   Therapy/Group: Individual Therapy  Hernando Reali,[place] OTR/L 03/10/2017, 3:50 PM

## 2017-03-10 NOTE — Progress Notes (Signed)
PD tx initiated via tenckhoff w/o problem, VSS, report given to Carlean PurlMaryann Barbour, RN

## 2017-03-11 ENCOUNTER — Inpatient Hospital Stay (HOSPITAL_COMMUNITY): Payer: Medicare Other | Admitting: Physical Therapy

## 2017-03-11 ENCOUNTER — Inpatient Hospital Stay (HOSPITAL_COMMUNITY): Payer: Medicare Other

## 2017-03-11 LAB — GLUCOSE, CAPILLARY
GLUCOSE-CAPILLARY: 507 mg/dL — AB (ref 65–99)
GLUCOSE-CAPILLARY: 440 mg/dL — AB (ref 65–99)
Glucose-Capillary: 591 mg/dL (ref 65–99)
Glucose-Capillary: 349 mg/dL — ABNORMAL HIGH (ref 65–99)
Glucose-Capillary: 223 mg/dL — ABNORMAL HIGH (ref 65–99)

## 2017-03-11 LAB — GLUCOSE, RANDOM: Glucose, Bld: 607 mg/dL (ref 65–99)

## 2017-03-11 MED ORDER — INSULIN GLARGINE 100 UNIT/ML ~~LOC~~ SOLN
10.0000 [IU] | Freq: Every day | SUBCUTANEOUS | Status: DC
  Administered 2017-03-11: 10 [IU] via SUBCUTANEOUS
  Filled 2017-03-11: qty 0.1

## 2017-03-11 MED ORDER — INSULIN ASPART 100 UNIT/ML ~~LOC~~ SOLN
5.0000 [IU] | SUBCUTANEOUS | Status: DC | PRN
  Administered 2017-03-11: 5 [IU] via SUBCUTANEOUS
  Filled 2017-03-11: qty 0.05

## 2017-03-11 MED ORDER — INSULIN ASPART 100 UNIT/ML ~~LOC~~ SOLN
10.0000 [IU] | Freq: Once | SUBCUTANEOUS | Status: AC
  Administered 2017-03-11: 10 [IU] via SUBCUTANEOUS

## 2017-03-11 MED ORDER — INSULIN ASPART 100 UNIT/ML ~~LOC~~ SOLN
5.0000 [IU] | Freq: Once | SUBCUTANEOUS | Status: AC
  Administered 2017-03-11: 5 [IU] via SUBCUTANEOUS

## 2017-03-11 MED ORDER — DARBEPOETIN ALFA 150 MCG/0.3ML IJ SOSY
150.0000 ug | PREFILLED_SYRINGE | INTRAMUSCULAR | Status: DC
  Administered 2017-03-11: 150 ug via SUBCUTANEOUS
  Filled 2017-03-11 (×3): qty 0.3

## 2017-03-11 MED ORDER — INSULIN GLARGINE 100 UNIT/ML ~~LOC~~ SOLN
20.0000 [IU] | SUBCUTANEOUS | Status: DC
  Administered 2017-03-11 – 2017-03-12 (×2): 20 [IU] via SUBCUTANEOUS
  Filled 2017-03-11 (×2): qty 0.2

## 2017-03-11 NOTE — Progress Notes (Signed)
PD tx completed.  No issues overnight  Catheter capped, clamped.  UF 2064 mL.  Will continue to monitor.

## 2017-03-11 NOTE — Progress Notes (Signed)
Physical Therapy Session Note  Patient Details  Name: [name] MRN: [id number] Date of Birth: 1940/05/12  Today's Date: 03/11/2017 PT Individual Time: 900-912, 1410-1515, 5465-6812 PT Individual Time Calculation (min): 12 min, 65 min, AND 15 min  and Today's Date: 03/11/2017 PT Missed Time:   Missed Time Reason:    Short Term Goals: Week 1:  PT Short Term Goal 1 (Week 1): Pt will perform bed mobility w/ Min A  PT Short Term Goal 2 (Week 1): Pt will transfer bed<>chair w/ Mod A  PT Short Term Goal 3 (Week 1): Pt will self-propel w/c 52' w/ supervision PT Short Term Goal 4 (Week 1): Pt will maintain static sitting balance w/ supervision   Skilled Therapeutic Interventions/Progress Updates:   Session 1:  Pt supine upon arrival, no c/o pain and reports high blood glucose level this morning. Per chart review and speaking w/ RN, pt's glucose levels continue to be in 500-600 range. Educated pt and wife on risks of exercising w/ high blood glucose. Will follow up later this date as medically appropriate. Pt and wife also asking questions about functions of air mattress, reporting increased difficulty w/ transfers. Instructed on use of inflating/deflating functions to make transfers easier and more similar to transfers w/ a firm mattress. Both appreciative. Pt resting in supine, all needs met.   Session 2:  Pt supine upon arrival and agreeable to therapy, no c/o pain. Transferred to EOB w/ Mod A and to w/c w/ Max A 2/2 uphill transfer to simulate home environment and increasing time required. Worked on w/c mobility going outside w/ w/c, on and off elevators, over doorsills, and uneven pavement. Supervision overall for power mobility, however required frequent cues for obstacle negotiation. Returned to unit and transferred to mat in gym w/ Mod A and worked on UE strengthening to [employer] buttocks in seated. After a few minutes, pt c/o 10/10 residual limb pain in BLEs. Pt decreased to 4/10 w/  laying to supine and taking residual limbs out of dependent positioning. Pt c/o room spinning, however vitals WNL and feeling resolved w/ a few minutes of rest. Pt continued to have 10/10 pain w/ dependent positioning to participate in therapy. Transferred back to w/c via slide board w/ Max A and returned to room. Ended session in w/c, tilted back for pain relief in LEs, all needs met. RN made aware of status and will return later this date to finish therapy.   Session 3:  Returned to find patient still in w/c, pain 10/10 and unchanged from earlier session. Pt requesting to lay down to help w/ pain. Transferred to EOB w/ Mod A and to supine w/ supervision. Max A to reposition and scoot in bed for comfort. Reinforced need to utilize air mattress for pressure relief. Both pt and wife verbalized understanding and PT will make RN aware. Missed 43 minutes of skilled PT today 2/2 10/10 pain. Pt in care of wife in supine, all needs met.   Therapy Documentation Precautions:  Precautions Precautions: Fall Restrictions Weight Bearing Restrictions: Yes RLE Weight Bearing: Non weight bearing RLE Partial Weight Bearing Percentage or Pounds: R BKA LLE Weight Bearing: Non weight bearing LLE Partial Weight Bearing Percentage or Pounds: L BKA  See Function Navigator for Current Functional Status.   Therapy/Group: Individual Therapy  [name] 03/11/2017, 4:39 PM

## 2017-03-11 NOTE — Progress Notes (Signed)
Occupational Therapy Session Note  Patient Details  Name: [name] MRN: [id number] Date of Birth: Apr 22, 1940  Today's Date: 03/11/2017 OT Individual Time: 1100-1156 OT Individual Time Calculation (min): 56 min    Short Term Goals: Week 1:  OT Short Term Goal 1 (Week 1): Pt will transfer to drop arm BSC with mod assist with slide board. OT Short Term Goal 2 (Week 1): Pt will don pants in bed with min A. OT Short Term Goal 3 (Week 1): Pt will perform lateral leans on BSC with S to assist with clothing management.  Skilled Therapeutic Interventions/Progress Updates:    1:1. Pt with elevated blood sugar this morning per RN report, but able to work in OT. Pt supine>EOB with MOD A for LE/trunk facilitation and VC for sequencing. Pt sits EOB iwht overall MIN A for sitting balance with posterior bias and 2 LOB back with VC for reaching forward to target to correct. Pt bathes UB EOB and requires A for back. PT dons shirt EOB with touching A for balance. Pt bathes/dresses LB rolling B with MIN A and Vc for using trunk muscles instead of bed rails to roll. Pt completes supine core exercises 1x10 scissor legs, bicycle crunches, leg elevation and open chair trunk rotation to improve core strength in for bed mobility/clothing management. Exited session with pt semi reclined in bed with call light in reach and wife present in room.   Therapy Documentation Precautions:  Precautions Precautions: Fall Restrictions Weight Bearing Restrictions: Yes RLE Weight Bearing: Non weight bearing RLE Partial Weight Bearing Percentage or Pounds: R BKA LLE Weight Bearing: Non weight bearing LLE Partial Weight Bearing Percentage or Pounds: L BKA  See Function Navigator for Current Functional Status.   Therapy/Group: Individual Therapy  Shon HaleStephanie M Lexine Jaspers 03/11/2017, 11:59 AM

## 2017-03-11 NOTE — Progress Notes (Signed)
CRITICAL VALUE ALERT  Critical Value:  CBG >600, STAT Lab 607  Date & Time Notied:  0702 CBG reading, 0730 STAT Lab   Provider Notified: Yes, Dr. Cato MulliganSwords  Orders Received/Actions taken: Give 10 units Novolog now, STAT Lab, re-check BS in one hour.

## 2017-03-11 NOTE — Progress Notes (Signed)
[name] [name] is a 76 y.o. male 12/03/1940 [id number]  Subjective: C/o elevated CBGs in 500-600 range; low CBG after boluses of insulin in the 50s. No new problems. Feeling OK.  Objective: Vital signs in last 24 hours: Temp:  [97.9 F (36.6 C)-98.6 F (37 C)] 98.3 F (36.8 C) (12/01 0547) Pulse Rate:  [101-110] 109 (12/01 0547) Resp:  [16-20] 20 (12/01 0547) BP: (120-144)/(64-84) 144/84 (12/01 0547) SpO2:  [95 %-100 %] 100 % (12/01 0547) Weight:  [167 lb 14.4 oz (76.2 kg)-171 lb 1.2 oz (77.6 kg)] 171 lb 1.2 oz (77.6 kg) (11/30 1835) Weight change:  Last BM Date: 03/10/17  Intake/Output from previous day: 11/30 0701 - 12/01 0700 In: [id number] [P.O.:440] Out: 15704  Last cbgs: CBG (last 3)  Recent Labs    03/10/17 2052 03/11/17 0702 03/11/17 0819  GLUCAP 402* >600* 591*     Physical Exam General: No apparent distress; appears chronically ill HEENT: not dry Lungs: Normal effort. Lungs clear to auscultation, no crackles or wheezes. Cardiovascular: Regular rate and rhythm, no edema Abdomen: S/NT/ND; BS(+), PD cath Musculoskeletal:  unchanged Neurological: No new neurological deficits Wounds: dressed  Skin: clear  Aging changes, scab on nose, pale Mental state: Alert, oriented, cooperative    Lab Results: BMET    Component Value Date/Time   NA 130 (L) 03/10/2017 1203   K 2.6 (LL) 03/10/2017 1203   CL 93 (L) 03/10/2017 1203   CO2 26 03/10/2017 1203   GLUCOSE 607 (HH) 03/11/2017 0730   BUN 49 (H) 03/10/2017 1203   CREATININE 5.06 (H) 03/10/2017 1203   CALCIUM 7.8 (L) 03/10/2017 1203   GFRNONAA 10 (L) 03/10/2017 1203   GFRAA 12 (L) 03/10/2017 1203   CBC    Component Value Date/Time   WBC 10.0 03/10/2017 1203   RBC 2.64 (L) 03/10/2017 1203   HGB 8.0 (L) 03/10/2017 1203   HCT 24.8 (L) 03/10/2017 1203   PLT 355 03/10/2017 1203   MCV 93.9 03/10/2017 1203   MCH 30.3 03/10/2017 1203   MCHC 32.3 03/10/2017 1203   RDW 16.0 (H) 03/10/2017 1203   LYMPHSABS 1.1  01/27/2017 0537   MONOABS 0.7 01/27/2017 0537   EOSABS 0.2 01/27/2017 0537   BASOSABS 0.0 01/27/2017 0537    Studies/Results: No results found.  Medications: I have reviewed the patient's current medications.  Assessment/Plan:   1. S/p R BKA; recent L BKA. CIR PT and OT 2. DVT proph w/Heparin 3. Post-op pain: Oxycodone prn 4. Anemia - on Aranesp inj 5. 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 CAD, s/p CABG - ASA and Plavix 7. Diastolic CHF - monitor weights 8. IDDM w/labile CBGs post-op. Cont adjusting Lantus. On SS 9. HTN: on Coreg 10. Dyslipidemia. Lipitor 11. Constipation: LOC 12. Elevated WBC - monitoring    Length of stay, days: 5  Sonda PrimesAlex Plotnikov , MD 03/11/2017, 9:03 AM

## 2017-03-11 NOTE — Progress Notes (Signed)
Patient has home CPAP and tolerates well. Places himself on and off machine

## 2017-03-11 NOTE — Progress Notes (Signed)
CRITICAL VALUE ALERT  Critical Value: CBG 507  Date & Time Notied:  1203  Provider Notified: Yes, Dr. Cato MulliganSwords  Orders Received/Actions taken: Give 5 units Novolog insulin now, re-check blood sugar in one hour. If >400 give another 5 units and recheck in one hour. Repeat for 3 hours or until blood sugar drops below 400. If sugar does not drop below 400 notify MD.

## 2017-03-11 NOTE — Progress Notes (Signed)
CRITICAL VALUE ALERT  Critical Value:  CBG 591   Date & Time Notied: 1016  Provider Notified: Yes, Dr. Posey ReaPlotnikov  Orders Received/Actions taken: Give 5 units Novolog now, re-check BS in one hour.

## 2017-03-12 ENCOUNTER — Encounter (HOSPITAL_COMMUNITY): Payer: Self-pay | Admitting: Internal Medicine

## 2017-03-12 ENCOUNTER — Other Ambulatory Visit: Payer: Self-pay

## 2017-03-12 ENCOUNTER — Observation Stay (HOSPITAL_COMMUNITY)
Admission: AD | Admit: 2017-03-12 | Discharge: 2017-03-14 | Disposition: A | Discharge status: Rehabilitation Facility | Payer: Medicare Other | Source: Ambulatory Visit | Attending: Internal Medicine | Admitting: Internal Medicine

## 2017-03-12 ENCOUNTER — Inpatient Hospital Stay (HOSPITAL_COMMUNITY): Payer: Medicare Other

## 2017-03-12 DIAGNOSIS — N186 End stage renal disease: Secondary | ICD-10-CM | POA: Insufficient documentation

## 2017-03-12 DIAGNOSIS — Z89512 Acquired absence of left leg below knee: Secondary | ICD-10-CM | POA: Insufficient documentation

## 2017-03-12 DIAGNOSIS — D631 Anemia in chronic kidney disease: Secondary | ICD-10-CM | POA: Insufficient documentation

## 2017-03-12 DIAGNOSIS — I255 Ischemic cardiomyopathy: Secondary | ICD-10-CM | POA: Insufficient documentation

## 2017-03-12 DIAGNOSIS — I132 Hypertensive heart and chronic kidney disease with heart failure and with stage 5 chronic kidney disease, or end stage renal disease: Secondary | ICD-10-CM | POA: Insufficient documentation

## 2017-03-12 DIAGNOSIS — Z7982 Long term (current) use of aspirin: Secondary | ICD-10-CM | POA: Insufficient documentation

## 2017-03-12 DIAGNOSIS — J9601 Acute respiratory failure with hypoxia: Principal | ICD-10-CM | POA: Diagnosis present

## 2017-03-12 DIAGNOSIS — E1122 Type 2 diabetes mellitus with diabetic chronic kidney disease: Secondary | ICD-10-CM | POA: Diagnosis not present

## 2017-03-12 DIAGNOSIS — Z89511 Acquired absence of right leg below knee: Secondary | ICD-10-CM | POA: Insufficient documentation

## 2017-03-12 DIAGNOSIS — Z951 Presence of aortocoronary bypass graft: Secondary | ICD-10-CM | POA: Insufficient documentation

## 2017-03-12 DIAGNOSIS — Z888 Allergy status to other drugs, medicaments and biological substances status: Secondary | ICD-10-CM | POA: Insufficient documentation

## 2017-03-12 DIAGNOSIS — Z992 Dependence on renal dialysis: Secondary | ICD-10-CM | POA: Diagnosis not present

## 2017-03-12 DIAGNOSIS — Z881 Allergy status to other antibiotic agents status: Secondary | ICD-10-CM | POA: Insufficient documentation

## 2017-03-12 DIAGNOSIS — G9340 Encephalopathy, unspecified: Secondary | ICD-10-CM | POA: Insufficient documentation

## 2017-03-12 DIAGNOSIS — Z794 Long term (current) use of insulin: Secondary | ICD-10-CM | POA: Insufficient documentation

## 2017-03-12 DIAGNOSIS — I509 Heart failure, unspecified: Secondary | ICD-10-CM | POA: Insufficient documentation

## 2017-03-12 DIAGNOSIS — R410 Disorientation, unspecified: Secondary | ICD-10-CM

## 2017-03-12 DIAGNOSIS — K5900 Constipation, unspecified: Secondary | ICD-10-CM | POA: Insufficient documentation

## 2017-03-12 DIAGNOSIS — E1165 Type 2 diabetes mellitus with hyperglycemia: Secondary | ICD-10-CM

## 2017-03-12 DIAGNOSIS — Z91048 Other nonmedicinal substance allergy status: Secondary | ICD-10-CM | POA: Insufficient documentation

## 2017-03-12 DIAGNOSIS — I2510 Atherosclerotic heart disease of native coronary artery without angina pectoris: Secondary | ICD-10-CM | POA: Diagnosis present

## 2017-03-12 DIAGNOSIS — E785 Hyperlipidemia, unspecified: Secondary | ICD-10-CM | POA: Insufficient documentation

## 2017-03-12 DIAGNOSIS — Z95810 Presence of automatic (implantable) cardiac defibrillator: Secondary | ICD-10-CM | POA: Insufficient documentation

## 2017-03-12 DIAGNOSIS — E1142 Type 2 diabetes mellitus with diabetic polyneuropathy: Secondary | ICD-10-CM | POA: Insufficient documentation

## 2017-03-12 DIAGNOSIS — Z882 Allergy status to sulfonamides status: Secondary | ICD-10-CM | POA: Insufficient documentation

## 2017-03-12 DIAGNOSIS — Z79899 Other long term (current) drug therapy: Secondary | ICD-10-CM | POA: Insufficient documentation

## 2017-03-12 DIAGNOSIS — I700 Atherosclerosis of aorta: Secondary | ICD-10-CM | POA: Insufficient documentation

## 2017-03-12 DIAGNOSIS — Z9889 Other specified postprocedural states: Secondary | ICD-10-CM | POA: Insufficient documentation

## 2017-03-12 DIAGNOSIS — Z7902 Long term (current) use of antithrombotics/antiplatelets: Secondary | ICD-10-CM | POA: Insufficient documentation

## 2017-03-12 DIAGNOSIS — J449 Chronic obstructive pulmonary disease, unspecified: Secondary | ICD-10-CM | POA: Insufficient documentation

## 2017-03-12 DIAGNOSIS — R000 Tachycardia, unspecified: Secondary | ICD-10-CM

## 2017-03-12 DIAGNOSIS — I4891 Unspecified atrial fibrillation: Secondary | ICD-10-CM | POA: Insufficient documentation

## 2017-03-12 DIAGNOSIS — E46 Unspecified protein-calorie malnutrition: Secondary | ICD-10-CM | POA: Insufficient documentation

## 2017-03-12 DIAGNOSIS — E876 Hypokalemia: Secondary | ICD-10-CM | POA: Insufficient documentation

## 2017-03-12 LAB — COMPREHENSIVE METABOLIC PANEL
ALBUMIN: 1.3 g/dL — AB (ref 3.5–5.0)
ALT: 42 U/L (ref 17–63)
AST: 49 U/L — ABNORMAL HIGH (ref 15–41)
Alkaline Phosphatase: 87 U/L (ref 38–126)
Anion gap: 8 (ref 5–15)
BUN: 52 mg/dL — ABNORMAL HIGH (ref 6–20)
CHLORIDE: 99 mmol/L — AB (ref 101–111)
CO2: 29 mmol/L (ref 22–32)
CREATININE: 5.03 mg/dL — AB (ref 0.61–1.24)
Calcium: 8 mg/dL — ABNORMAL LOW (ref 8.9–10.3)
GFR, EST AFRICAN AMERICAN: 12 mL/min — AB (ref >60)
GFR, EST NON AFRICAN AMERICAN: 10 mL/min — AB (ref >60)
Glucose, Bld: 141 mg/dL — ABNORMAL HIGH (ref 65–99)
Potassium: 2.3 mmol/L — CL (ref 3.5–5.1)
SODIUM: 136 mmol/L (ref 135–145)
Total Bilirubin: 0.5 mg/dL (ref 0.3–1.2)
Total Protein: 5.3 g/dL — ABNORMAL LOW (ref 6.5–8.1)

## 2017-03-12 LAB — GLUCOSE, CAPILLARY
GLUCOSE-CAPILLARY: 388 mg/dL — AB (ref 65–99)
GLUCOSE-CAPILLARY: 313 mg/dL — AB (ref 65–99)
GLUCOSE-CAPILLARY: 159 mg/dL — AB (ref 65–99)
GLUCOSE-CAPILLARY: 100 mg/dL — AB (ref 65–99)
Glucose-Capillary: 546 mg/dL (ref 65–99)
Glucose-Capillary: 500 mg/dL — ABNORMAL HIGH (ref 65–99)
Glucose-Capillary: 470 mg/dL — ABNORMAL HIGH (ref 65–99)
Glucose-Capillary: 237 mg/dL — ABNORMAL HIGH (ref 65–99)

## 2017-03-12 LAB — CBC
HCT: 25.2 % — ABNORMAL LOW (ref 39.0–52.0)
HEMOGLOBIN: 7.9 g/dL — AB (ref 13.0–17.0)
MCH: 29.4 pg (ref 26.0–34.0)
MCHC: 31.3 g/dL (ref 30.0–36.0)
MCV: 93.7 fL (ref 78.0–100.0)
PLATELETS: 416 10*3/uL — AB (ref 150–400)
RBC: 2.69 MIL/uL — AB (ref 4.22–5.81)
RDW: 16.1 % — ABNORMAL HIGH (ref 11.5–15.5)
WBC: 13.1 10*3/uL — AB (ref 4.0–10.5)

## 2017-03-12 LAB — BLOOD GAS, ARTERIAL
Acid-Base Excess: 5.9 mmol/L — ABNORMAL HIGH (ref 0.0–2.0)
BICARBONATE: 29.1 mmol/L — AB (ref 20.0–28.0)
Drawn by: 236041
FIO2: 21
O2 Saturation: 97.4 %
PH ART: 7.518 — AB (ref 7.350–7.450)
PO2 ART: 83.2 mmHg (ref 83.0–108.0)
Patient temperature: 98.6
pCO2 arterial: 36 mmHg (ref 32.0–48.0)

## 2017-03-12 LAB — AMMONIA: Ammonia: 30 umol/L (ref 9–35)

## 2017-03-12 LAB — TROPONIN I: TROPONIN I: 0.52 ng/mL — AB (ref <0.03)

## 2017-03-12 LAB — PROCALCITONIN: Procalcitonin: 0.48 ng/mL

## 2017-03-12 LAB — LACTIC ACID, PLASMA: LACTIC ACID, VENOUS: 1.9 mmol/L (ref 0.5–1.9)

## 2017-03-12 LAB — D-DIMER, QUANTITATIVE (NOT AT ARMC): D DIMER QUANT: 1.27 ug{FEU}/mL — AB (ref 0.00–0.50)

## 2017-03-12 LAB — TSH: TSH: 4.19 u[IU]/mL (ref 0.350–4.500)

## 2017-03-12 MED ORDER — RENA-VITE PO TABS
1.0000 | ORAL_TABLET | Freq: Every day | ORAL | Status: DC
  Administered 2017-03-12 – 2017-03-13 (×2): 1 via ORAL
  Filled 2017-03-12 (×2): qty 1

## 2017-03-12 MED ORDER — INSULIN ASPART 100 UNIT/ML ~~LOC~~ SOLN
10.0000 [IU] | SUBCUTANEOUS | Status: AC
  Administered 2017-03-12: 10 [IU] via SUBCUTANEOUS

## 2017-03-12 MED ORDER — ATORVASTATIN CALCIUM 80 MG PO TABS
80.0000 mg | ORAL_TABLET | Freq: Every day | ORAL | Status: DC
  Administered 2017-03-12 – 2017-03-13 (×2): 80 mg via ORAL
  Filled 2017-03-12 (×2): qty 1

## 2017-03-12 MED ORDER — CLOPIDOGREL BISULFATE 75 MG PO TABS
75.0000 mg | ORAL_TABLET | Freq: Every day | ORAL | Status: DC
  Administered 2017-03-13 – 2017-03-14 (×2): 75 mg via ORAL
  Filled 2017-03-12 (×2): qty 1

## 2017-03-12 MED ORDER — INSULIN ASPART 100 UNIT/ML ~~LOC~~ SOLN: 0.0000 [IU] | Freq: Three times a day (TID) | SUBCUTANEOUS | Status: DC

## 2017-03-12 MED ORDER — INSULIN ASPART 100 UNIT/ML ~~LOC~~ SOLN
10.0000 [IU] | SUBCUTANEOUS | Status: AC | PRN
  Administered 2017-03-12 (×3): 10 [IU] via SUBCUTANEOUS
  Filled 2017-03-12 (×2): qty 0.1

## 2017-03-12 MED ORDER — CYCLOBENZAPRINE HCL 10 MG PO TABS: 10.0000 mg | ORAL_TABLET | Freq: Two times a day (BID) | ORAL | Status: DC | PRN

## 2017-03-12 MED ORDER — POTASSIUM CHLORIDE CRYS ER 20 MEQ PO TBCR
30.0000 meq | EXTENDED_RELEASE_TABLET | Freq: Every day | ORAL | Status: DC
  Administered 2017-03-13 – 2017-03-14 (×2): 30 meq via ORAL
  Filled 2017-03-12 (×2): qty 1

## 2017-03-12 MED ORDER — ONDANSETRON HCL 4 MG PO TABS: 4.0000 mg | ORAL_TABLET | Freq: Four times a day (QID) | ORAL | Status: DC | PRN

## 2017-03-12 MED ORDER — INSULIN GLARGINE 100 UNIT/ML ~~LOC~~ SOLN
20.0000 [IU] | Freq: Every day | SUBCUTANEOUS | Status: DC
  Administered 2017-03-12: 20 [IU] via SUBCUTANEOUS
  Filled 2017-03-12: qty 0.2

## 2017-03-12 MED ORDER — ACETAMINOPHEN 325 MG PO TABS
650.0000 mg | ORAL_TABLET | Freq: Four times a day (QID) | ORAL | Status: DC | PRN
  Administered 2017-03-13: 650 mg via ORAL
  Filled 2017-03-12: qty 2

## 2017-03-12 MED ORDER — ASPIRIN EC 81 MG PO TBEC
81.0000 mg | DELAYED_RELEASE_TABLET | Freq: Every day | ORAL | Status: DC
Start: 2017-03-13 — End: 2017-03-14
  Administered 2017-03-13 – 2017-03-14 (×2): 81 mg via ORAL
  Filled 2017-03-12 (×2): qty 1

## 2017-03-12 MED ORDER — DILTIAZEM HCL ER COATED BEADS 120 MG PO CP24
120.0000 mg | ORAL_CAPSULE | Freq: Every day | ORAL | Status: AC
  Administered 2017-03-12: 120 mg via ORAL
  Filled 2017-03-12 (×2): qty 1

## 2017-03-12 MED ORDER — VITAMIN D (ERGOCALCIFEROL) 1.25 MG (50000 UNIT) PO CAPS: 50000.0000 [IU] | ORAL_CAPSULE | ORAL | Status: DC

## 2017-03-12 MED ORDER — POTASSIUM CHLORIDE CRYS ER 20 MEQ PO TBCR
40.0000 meq | EXTENDED_RELEASE_TABLET | Freq: Four times a day (QID) | ORAL | Status: AC
  Administered 2017-03-12 – 2017-03-13 (×2): 40 meq via ORAL
  Filled 2017-03-12 (×2): qty 2

## 2017-03-12 MED ORDER — POLYETHYLENE GLYCOL 3350 17 G PO PACK
17.0000 g | PACK | Freq: Every day | ORAL | Status: DC
  Administered 2017-03-13 – 2017-03-14 (×2): 17 g via ORAL
  Filled 2017-03-12 (×2): qty 1

## 2017-03-12 MED ORDER — INSULIN GLARGINE 100 UNIT/ML ~~LOC~~ SOLN
14.0000 [IU] | Freq: Every day | SUBCUTANEOUS | Status: DC
  Filled 2017-03-12: qty 0.14

## 2017-03-12 MED ORDER — CARVEDILOL 6.25 MG PO TABS
6.2500 mg | ORAL_TABLET | Freq: Every day | ORAL | Status: DC
  Administered 2017-03-12: 6.25 mg via ORAL
  Filled 2017-03-12: qty 1

## 2017-03-12 MED ORDER — ONDANSETRON HCL 4 MG/2ML IJ SOLN
4.0000 mg | Freq: Four times a day (QID) | INTRAMUSCULAR | Status: DC | PRN
  Administered 2017-03-13: 4 mg via INTRAVENOUS
  Filled 2017-03-12: qty 2

## 2017-03-12 MED ORDER — GENTAMICIN SULFATE 0.1 % EX CREA
1.0000 "application " | TOPICAL_CREAM | Freq: Every day | CUTANEOUS | Status: DC
  Administered 2017-03-13 – 2017-03-14 (×2): 1 via TOPICAL
  Filled 2017-03-12: qty 15

## 2017-03-12 MED ORDER — OXYCODONE HCL 5 MG PO TABS
2.5000 mg | ORAL_TABLET | Freq: Four times a day (QID) | ORAL | Status: DC | PRN
  Administered 2017-03-13 (×2): 2.5 mg via ORAL
  Filled 2017-03-12 (×2): qty 1

## 2017-03-12 MED ORDER — ACETAMINOPHEN 650 MG RE SUPP: 650.0000 mg | Freq: Four times a day (QID) | RECTAL | Status: DC | PRN

## 2017-03-12 MED ORDER — CALCIUM ACETATE (PHOS BINDER) 667 MG PO CAPS
667.0000 mg | ORAL_CAPSULE | Freq: Three times a day (TID) | ORAL | Status: DC
  Administered 2017-03-13 – 2017-03-14 (×4): 667 mg via ORAL
  Filled 2017-03-12 (×4): qty 1

## 2017-03-12 MED ORDER — INSULIN GLARGINE 100 UNIT/ML ~~LOC~~ SOLN
24.0000 [IU] | SUBCUTANEOUS | Status: DC
  Filled 2017-03-12: qty 0.24

## 2017-03-12 MED ORDER — HEPARIN SODIUM (PORCINE) 5000 UNIT/ML IJ SOLN
5000.0000 [IU] | Freq: Three times a day (TID) | INTRAMUSCULAR | Status: DC
  Administered 2017-03-12 – 2017-03-13 (×2): 5000 [IU] via SUBCUTANEOUS
  Filled 2017-03-12 (×3): qty 1

## 2017-03-12 MED ORDER — NITROGLYCERIN 0.4 MG SL SUBL: 0.4000 mg | SUBLINGUAL_TABLET | SUBLINGUAL | Status: DC | PRN

## 2017-03-12 MED ORDER — DOCUSATE SODIUM 100 MG PO CAPS
100.0000 mg | ORAL_CAPSULE | Freq: Two times a day (BID) | ORAL | Status: DC
  Administered 2017-03-12 – 2017-03-14 (×4): 100 mg via ORAL
  Filled 2017-03-12 (×4): qty 1

## 2017-03-12 NOTE — Progress Notes (Signed)
CRITICAL VALUE ALERT  Critical Value:  CBG 500  Date & Time Notied:  12/102/18 0804  Provider Notified: Dr. Swords/Dr. Plotnikov  Orders Received/Actions taken: Give 10 units insulin now. Recheck blood sugar in one hour. If >300 give 10 more units and recheck blood sugar in one hour. Repeat hourly x3. Notify MD if sugar >300 after 3rd dose of insulin.

## 2017-03-12 NOTE — Progress Notes (Signed)
During dressing change to right stump pt observed to have increased area of redness and purple discoloration along incision. Stapes intact. Moderate sanguineous drainage present. Non adherent dressing and gauze applied, wrapped with ace wrap for compression. On-call ortho notified. New orders received from Dr. Cleophas DunkerWhitfield to continue pressure dressing, change gauze dressing as needed. Dr. Cleophas DunkerWhitfield will contact [name] to inform of concerns so he can round on patient tomorrow.  At 1245 pt c/o shortness of breath and not feeling well generally. Pt vitals obtained. HR 104 irregular apically. BP 143/109 dinamap, 140/88 manually. SpO2 98% T98.4. Contacted on-call MD Dr. Posey ReaPlotnikov. Informed of assessment findings including skin findings reported to ortho. New orders received for EKG, CMET, CBC, CXR bedside. Preliminary EKG results Afib with rapid ventricular response with premature ventricular or aberrantly conducted complexes. CXR results compared to 11/25 Cardiomegaly with pulmonary congestion. Dr. Posey ReaPlotnikov notified of findings new orders received for Cardizem CD 120mg  x1 dose now, vital signs hourly x3 hours. Call MD with any other concerns and If heart goes above 120.

## 2017-03-12 NOTE — H&P (Addendum)
History and Physical    Joshua [name] ZOX:[id number] DOB: [birth year] DOA: 03/12/2017  PCP: Dorice Lamashivianathan, Susan S, MD  Patient coming from: Patient was transferred from rehab at [facility].  Chief Complaint: Shortness of breath.  Confusion.  HPI: Joshua [name] is a 76 y.o. male with history of CAD status post CABG, ESRD on peritoneal dialysis, sleep apnea, diabetes mellitus type 2 who has had recent right BKA and also a few days ago left BKA was eventually [facility] was found to be acutely confused and also per the patient and patient wife patient was short of breath.  Chest x-ray  done at that time did not show anything acute.  As per the transferring physician's note patient was in new onset A. fib with RVR and was given Cardizem CD.  On my exam patient appears not in distress states his shortness of breath is still present but improved.  Denies any chest pain.  As per the wife patient did not appear confused to her.  Patient is following commands and moves all extremities and oriented to time place and person.  Denies any abdominal pain nausea vomiting.Patient is afebrile.  Earlier in the day patient's blood sugar was markedly elevated for which patient was given increasing dose of insulin.  Patient also was given Cardizem for A. fib with RVR.  Patient's labs was reviewed which shows hypokalemia for which nephrologist is planning to get replace potassium.  Patient is about to get his peritoneal dialysis.  ED Course: Patient was a direct transfer.  Review of Systems: As per HPI, rest all negative.   Past Medical History:  Diagnosis Date  . AICD (automatic cardioverter/defibrillator) present    [employer] [phone] [id number] lead [id number]  . CHF (congestive heart failure) (HCC)   . Complication of anesthesia   . COPD (chronic obstructive pulmonary disease) (HCC)   . Coronary artery disease    CABG 2011  . Diabetes mellitus without complication  (HCC)   . ESRD on peritoneal dialysis [facility](HCC)    [place] in [place].   . Gangrene (HCC)    left knee  . GERD (gastroesophageal reflux disease)   . History of peritonitis    PD cath related peritonitis in April 2017  . Hypertension   . Peripheral vascular disease (HCC)   . PONV (postoperative nausea and vomiting)   . Sjogren's syndrome (HCC)    Per pt's wife, was diagnosed in 2016 during w/u for cause of renal failure prior to starting dialysis.  He had a rash on his chest apparently prompting this w/u.  Never had renal bx.  He was referred to a rheum [place] in [place] and per the wife he was treated with MTX and other medications but had side effects to "all of it" and isn't taking anything for this as of Jun 2017.   Joshua Kitchen. Sleep apnea    wears Bipap  . Stroke (HCC)   . Wound dehiscence    Right foot    Past Surgical History:  Procedure Laterality Date  . ABDOMINAL AORTOGRAM W/LOWER EXTREMITY N/A 11/29/2016   Procedure: ABDOMINAL AORTOGRAM W/LOWER EXTREMITY;  Surgeon: Nada LibmanBrabham, Vance W, MD;  [facility]: [facility] INV[facility]IVE [facility];  Service: Cardiovascular;  Laterality: N/A;  . AMPUTATION Left 11/29/2016   Procedure: LEFT THIRD TOE AMPUTATION;  Surgeon: Nada LibmanBrabham, Vance W, MD;  Location: [facility] OR;  Service: Vascular;  Laterality: Left;  . AMPUTATION  Left 12/23/2016   Procedure: Left Transmetatarsal Amputation;  Surgeon: Nadara Mustard, MD;  Location: [facility] OR;  Service: Orthopedics;  Laterality: Left;  . AMPUTATION Left 01/11/2017   Procedure: LEFT BELOW KNEE AMPUTATION;  Surgeon: Nadara Mustard, MD;  Location: [facility] OR;  Service: Orthopedics;  Laterality: Left;  . AMPUTATION Right 01/20/2017   Procedure: AMPUTATION RIGHT FIFTH TOE;  Surgeon: Nada Libman, MD;  Location: [facility] OR;  Service: Vascular;  Laterality: Right;  . AMPUTATION Right 03/01/2017   Procedure: RIGHT BELOW KNEE AMPUTATION;  Surgeon: Nadara Mustard, MD;  Location: [facility] OR;  Service: Orthopedics;  Laterality:  Right;  . ANGIOPLASTY  12/15/2016   Procedure: BALLOON ANGIOPLASTY;  Surgeon: Maeola Harman, MD;  Location: [facility] OR;  Service: Vascular;;  . cardiac catherization with stent placement  2017  . COLONOSCOPY W/ BIOPSIES AND POLYPECTOMY    . CORONARY ARTERY BYPASS GRAFT  2011  . FALSE ANEURYSM REPAIR Right 12/15/2016   Procedure: REPAIR FALSE ANEURYSM;  Surgeon: Maeola Harman, MD;  Location: [facility] OR;  Service: Vascular;  Laterality: Right;  . IRRIGATION AND DEBRIDEMENT FOOT Left 12/15/2016   Procedure: IRRIGATION AND DEBRIDEMENT FOOT;  Surgeon: Maeola Harman, MD;  Location: [facility] OR;  Service: Vascular;  Laterality: Left;  . LEFT HEART CATH AND CORS/GRAFTS ANGIOGRAPHY N/A 01/09/2017   Procedure: LEFT HEART CATH AND CORS/GRAFTS ANGIOGRAPHY;  Surgeon: Runell Gess, MD;  [facility];  Service: Cardiovascular;  Laterality: N/A;  . LOWER EXTREMITY ANGIOGRAM Left 12/15/2016   Procedure: LOWER EXTREMITY ANGIOGRAM; PEDAL ACCESS;  Surgeon: Maeola Harman, MD;  Location: [facility] OR;  Service: Vascular;  Laterality: Left;  . PERIPHERAL VASCULAR BALLOON ANGIOPLASTY  11/29/2016   Procedure: PERIPHERAL VASCULAR BALLOON ANGIOPLASTY;  Surgeon: Nada Libman, MD;  [facility];  Service: Cardiovascular;;  LT Peroneal AT attempted unsuccessful  . STUMP REVISION Left 03/01/2017   Procedure: REVISION LEFT BELOW KNEE AMPUTATION;  Surgeon: Nadara Mustard, MD;  Location: [facility] OR;  Service: Orthopedics;  Laterality: Left;     reports that he has quit smoking. His smoking use included pipe. he has never used smokeless tobacco. He reports that he does not drink alcohol or use drugs.  Allergies  Allergen Reactions  . Gabapentin (Once-Daily) Hypertension  . Lisinopril Hives  . Methotrexate Derivatives Other (See Comments)    blisters  . Sulfa Antibiotics Hives  . Tape Hives  . Vancomycin Other (See Comments)    Blisters   . Zanaflex [Tizanidine Hcl]       Hypotensive stroke    Family History  Problem Relation Age of Onset  . Heart disease Father   . Diabetes Mother     Prior to Admission medications   Medication Sig Start Date End Date Taking? Authorizing Provider  aspirin EC 81 MG tablet Take 81 mg by mouth daily.    [provider]  atorvastatin (LIPITOR) 80 MG tablet Take 1 tablet (80 mg total) by mouth daily at 6 PM. 02/07/17   Angiulli, Mcarthur Rossetti, PA-C  calcium acetate (PHOSLO) 667 MG capsule Take 1 capsule (667 mg total) by mouth 3 (three) times daily with meals. 02/07/17   Angiulli, Mcarthur Rossetti, PA-C  carvedilol (COREG) 6.25 MG tablet Take 1 tablet (6.25 mg total) by mouth at bedtime. 02/07/17   Angiulli, Mcarthur Rossetti, PA-C  clopidogrel (PLAVIX) 75 MG tablet Take 1 tablet (75 mg total) by mouth daily. 02/07/17   Angiulli, Mcarthur Rossetti, PA-C  cyclobenzaprine (  FLEXERIL) 10 MG tablet Take 1 tablet (10 mg total) 2 (two) times daily as needed by mouth for muscle spasms. 02/14/17   Adonis Huguenin, NP  docusate sodium (COLACE) 100 MG capsule Take 100 mg by mouth 2 (two) times daily.     [provider]  insulin NPH Human (HUMULIN N,NOVOLIN N) 100 UNIT/ML injection Inject 0.1 mLs (10 Units total) into the skin daily before breakfast. 02/07/17   Angiulli, Mcarthur Rossetti, PA-C  insulin NPH Human (HUMULIN N,NOVOLIN N) 100 UNIT/ML injection Inject 0.65 mLs (65 Units total) into the skin at bedtime. 02/07/17   Angiulli, Mcarthur Rossetti, PA-C  multivitamin (RENA-VIT) TABS tablet Take 1 tablet by mouth at bedtime. 01/17/17   Narda Bonds, MD  nitroGLYCERIN (NITROSTAT) 0.4 MG SL tablet Place 0.4 mg under the tongue every 5 (five) minutes as needed for chest pain.    [provider]  oxyCODONE (OXY IR/ROXICODONE) 5 MG immediate release tablet Take 0.5 tablets (2.5 mg total) by mouth every 6 (six) hours as needed for severe pain. 02/07/17   Angiulli, Mcarthur Rossetti, PA-C  polyethylene glycol (MIRALAX / GLYCOLAX) packet Take 17 g by mouth daily. 01/18/17    Narda Bonds, MD  RELION INSULIN SYRINGE 31G X 15/64" 1 ML MISC  02/26/17   [provider]  silver sulfADIAZINE (SILVADENE) 1 % cream Apply 1 application daily topically. 02/23/17   Nadara Mustard, MD  Vitamin D, Ergocalciferol, (DRISDOL) 50000 units CAPS capsule Take 50,000 Units by mouth every 30 (thirty) days.     [provider]    Physical Exam: Vitals:   03/12/17 1855  BP: (!) 119/47  Pulse: 99  Resp: 18  Temp: 98.8 F (37.1 C)  TempSrc: Oral  SpO2: 92%  Weight: 77 kg (169 lb 12.1 oz)  Height: 5\' 9"  (1.753 m)      Constitutional: Moderately built and nourished. Vitals:   03/12/17 1855  BP: (!) 119/47  Pulse: 99  Resp: 18  Temp: 98.8 F (37.1 C)  TempSrc: Oral  SpO2: 92%  Weight: 77 kg (169 lb 12.1 oz)  Height: 5\' 9"  (1.753 m)   Eyes: Anicteric no pallor. ENMT: No discharge from the ears eyes nose or mouth. Neck: No JVD appreciated no mass felt. Respiratory: No rhonchi or crepitations. Cardiovascular: S1-S2 heard no murmurs appreciated. Abdomen: Soft nontender bowel sounds present. Musculoskeletal: No edema.  No joint effusion. Skin: No rash.  Skin appears warm. Neurologic: Alert awake oriented to time place and person.  Moves all extremities 5 x 5. Psychiatric: Appears normal.  Normal affect.   Labs on Admission: I have personally reviewed following labs and imaging studies  CBC: Recent Labs  Lab 03/06/17 0205 03/10/17 1203 03/12/17 1429  WBC 12.5* 10.0 13.1*  HGB 8.1* 8.0* 7.9*  HCT 25.6* 24.8* 25.2*  MCV 94.8 93.9 93.7  PLT 300 355 416*   Basic Metabolic Panel: Recent Labs  Lab 03/06/17 0205 03/07/17 0555 03/10/17 0727 03/10/17 1203 03/11/17 0730 03/12/17 1429  NA  --  130*  --  130*  --  136  K 3.8 3.6  --  2.6*  --  2.3*  CL  --  95*  --  93*  --  99*  CO2  --  23  --  26  --  29  GLUCOSE  --  383* 447* 328* 607* 141*  BUN  --  43*  --  49*  --  52*  CREATININE  --  4.71*  --  5.06*  --  5.03*  CALCIUM  --   7.5*  --  7.8*  --  8.0*  PHOS  --  3.7  --  3.8  --   --    GFR: Estimated Creatinine Clearance: 12.5 mL/min (A) (by C-G formula based on SCr of 5.03 mg/dL (H)). Liver Function Tests: Recent Labs  Lab 03/07/17 0555 03/10/17 1203 03/12/17 1429  AST  --   --  49*  ALT  --   --  42  ALKPHOS  --   --  87  BILITOT  --   --  0.5  PROT  --   --  5.3*  ALBUMIN 1.3* 1.4* 1.3*   No results for input(s): LIPASE, AMYLASE in the last 168 hours. No results for input(s): AMMONIA in the last 168 hours. Coagulation Profile: No results for input(s): INR, PROTIME in the last 168 hours. Cardiac Enzymes: No results for input(s): CKTOTAL, CKMB, CKMBINDEX, TROPONINI in the last 168 hours. BNP (last 3 results) No results for input(s): PROBNP in the last 8760 hours. HbA1C: No results for input(s): HGBA1C in the last 72 hours. CBG: Recent Labs  Lab 03/12/17 0933 03/12/17 1048 03/12/17 1218 03/12/17 1417 03/12/17 1656  GLUCAP 470* 388* 313* 159* 100*   Lipid Profile: No results for input(s): CHOL, HDL, LDLCALC, TRIG, CHOLHDL, LDLDIRECT in the last 72 hours. Thyroid Function Tests: No results for input(s): TSH, T4TOTAL, FREET4, T3FREE, THYROIDAB in the last 72 hours. Anemia Panel: No results for input(s): VITAMINB12, FOLATE, FERRITIN, TIBC, IRON, RETICCTPCT in the last 72 hours. Urine analysis:    Component Value Date/Time   COLORURINE YELLOW 03/02/2017 1958   APPEARANCEUR CLOUDY (A) 03/02/2017 1958   LABSPEC 1.016 03/02/2017 1958   PHURINE 5.0 03/02/2017 1958   GLUCOSEU >=500 (A) 03/02/2017 1958   HGBUR MODERATE (A) 03/02/2017 1958   BILIRUBINUR NEGATIVE 03/02/2017 1958   KETONESUR NEGATIVE 03/02/2017 1958   PROTEINUR 100 (A) 03/02/2017 1958   NITRITE NEGATIVE 03/02/2017 1958   LEUKOCYTESUR LARGE (A) 03/02/2017 1958   Sepsis Labs: @LABRCNTIP (procalcitonin:4,lacticidven:4) ) Recent Results (from the past 240 hour(s))  Culture, blood (routine x 2)     Status: None   Collection  Time: 03/04/17 10:50 PM  Result Value Ref Range Status   Specimen Description BLOOD RIGHT ANTECUBITAL  Final   Special Requests   Final    BOTTLES DRAWN AEROBIC ONLY Blood Culture adequate volume   Culture NO GROWTH 5 DAYS  Final   Report Status 03/09/2017 FINAL  Final  Culture, blood (routine x 2)     Status: None   Collection Time: 03/04/17 11:06 PM  Result Value Ref Range Status   Specimen Description BLOOD RIGHT HAND  Final   Special Requests   Final    BOTTLES DRAWN AEROBIC ONLY Blood Culture adequate volume   Culture NO GROWTH 5 DAYS  Final   Report Status 03/09/2017 FINAL  Final     Radiological Exams on Admission: Dg Chest Port 1 View  Result Date: 03/12/2017 CLINICAL DATA:  Shortness of breath. EXAM: PORTABLE CHEST 1 VIEW COMPARISON:  03/05/2017 FINDINGS: 1328 hours. Low lung volumes. The cardio pericardial silhouette is enlarged. There is pulmonary vascular congestion without overt pulmonary edema. Left defibrillator again noted. Patient is status post CABG. The visualized bony structures of the thorax are intact. IMPRESSION: Cardiomegaly with vascular congestion. Electronically Signed   By: Kennith Center M.D.   On: 03/12/2017 13:58    EKG: Independently reviewed.  Present EKG shows sinus  rhythm.  Early evaluation and possible A. fib.  There was a brief run of A. fib on the telemetry monitor.  Assessment/Plan Active Problems:   ESRD on peritoneal dialysis (HCC)   Acute respiratory failure with hypoxia (HCC)   Uncontrolled type 2 diabetes mellitus with hyperglycemia, with long-term current use of insulin (HCC)   Chronic obstructive pulmonary disease (HCC)   S/P bilateral BKA (below knee amputation) (HCC)   Coronary artery disease involving native coronary artery of native heart without angina pectoris    1. Acute respiratory failure with hypoxia -patient's EF measured in April 2018 was 35% as per the care everywhere.  Patient is not in distress but complains of shortness  of breath.  Chest x-ray was unremarkable and patient is not having any active wheezing or crepitations.  Patient shortness of breath could be secondary to his new onset A. fib with possible CHF.  Will have to rule out PE for which I have ordered d-dimer if positive will get a VQ scan.  Patient is about to get peritoneal dialysis which will improve with fluid overload if any.  Closely follow intake output metabolic panel.  We will cycle cardiac markers. 2. Acute encephalopathy -as per the transferring MD.  After arrival patient appears to be back to his baseline.  Appears nonfocal.  Closely monitor.  Check ammonia level. 3. New-onset A. fib with RVR -has improved with Cardizem CD.  We will keep patient on heparin infusion because chads 2 vasc score is more than 2.  Cycle cardiac markers check TSH.  Will check 2D echo again. On Cardizem CD and Coreg. 4. Diabetes mellitus type 2 uncontrolled -has had increasing blood sugar levels.  I have discussed with pharmacy and obtain that patient is on Lantus 20 units at bedtime which appear to keep for now and along with sliding scale coverage.  Closely follow CBGs and metabolic panel. 5. CAD status post CABG -denies any chest pain.  However since patient has shortness of breath we will cycle cardiac markers.  Check 2D echo. 6. Recent right BKA and a few days ago had left BKA -per orthopedics. 7. COPD not actively wheezing. 8. Hypokalemia is being replaced by nephrologist.  I have reviewed patient's old charts and labs. Discussed with nephrologist.   DVT prophylaxis: Heparin. Code Status: Full code. Family Communication: Patient's wife. Disposition Plan: To be determined. Consults called: Nephrology. Admission status: Inpatient.   Eduard ClosArshad N Layaan Mott MD [facility] Pager [phone].  If 7PM-7AM, please contact night-coverage [email or web address] Password [email or web address]  03/12/2017, 8:37 PM

## 2017-03-12 NOTE — Progress Notes (Signed)
MD paged patient being admitted on the unit 3e16

## 2017-03-12 NOTE — Progress Notes (Addendum)
Called by Rehab MD, Dr.Plotnikov to request transfer to Inpatient/ Tele Complex pt with brittle DM, ESRD on CCPD Nightly admitted with PAD and now s/p revision of L BKA and new R BKA for Limb ischemia, currently in CIR for Rehab. Today with confusion, not appropriately following commands, EKG with Afib HR in 100s-110s, Exam nonfocal per MD. CXR with some vascular congestion, labs with anemia and mild leukocytosis, needs workup for confusion, meds vs infection, less likely CVA -And AFib, already on Coreg and Cardizem, probably needs dose increased and volume managed with PD, consideration of anticoagulation. Accepted to Tele, ideally progressive care Unit. We will see him on the acute side when new bed and account has been assigned, d/w Dr.Plotnikov.  [name]

## 2017-03-12 NOTE — Progress Notes (Signed)
Per CCMD patient had 5 beats of PVC, and after that HR went up to 158 non-sustained. Patient asleep, asymptomatic.Toniann FailKakrakandy MD at bedside, informed. Requesting EKG.   Will continue to monitor.  Tylena Prisk, RN

## 2017-03-12 NOTE — Plan of Care (Signed)
  RH SKIN INTEGRITY RH STG SKIN FREE OF INFECTION/BREAKDOWN Description No new breakdown/infection with mod assist   03/12/2017 1211 - Not Progressing by Fabian SharpWhisner, Joshua Meath, RN Note Pt with increased area of redness and purple discolored skin on R BKA stump. Ortho on-call notified. Waiting for instructions.

## 2017-03-12 NOTE — Progress Notes (Signed)
Troponin 0.52. Patient denies chest pain.  MD Jaci StandardKakarkandy informed about Troponin and EKG results.  Per MD we will continue cycling troponin's. No other orders at this time.   Aasiya Creasey, RN

## 2017-03-12 NOTE — Plan of Care (Signed)
  Consults RH LIMB LOSS PATIENT EDUCATION Description Description: See Patient Education module for eduction specifics. 03/12/2017 0214 - Progressing by Pollyann SavoyLloyd, Savannah Erbe D, RN   RH SAFETY RH STG ADHERE TO SAFETY PRECAUTIONS W/ASSISTANCE/DEVICE Description STG Adhere to Safety Precautions With Min Assistance/Device.  03/12/2017 0214 - Progressing by Pollyann SavoyLloyd, Yaeli Hartung D, RN   RH SKIN INTEGRITY RH STG SKIN FREE OF INFECTION/BREAKDOWN Description No new breakdown/infection with mod assist   03/12/2017 0214 - Progressing by Pollyann SavoyLloyd, Orvill Coulthard D, RN   RH PAIN MANAGEMENT RH STG PAIN MANAGED AT OR BELOW PT'S PAIN GOAL Description <4 out of 10.   03/12/2017 0214 - Progressing by Pollyann SavoyLloyd, Izea Livolsi D, RN

## 2017-03-12 NOTE — Progress Notes (Signed)
Pt transfer to the [facility] from rehab unit in a stable condition with a wife in a bed side. Pt is in a special bed. Pt's BKA site of BLLE is ace wrapped and dressing took placed on receiving so RN is unable to assess the wound directly but wife in a bed side showed picture taken earlier of the wounds where red side wound looks purplish and black but left wound looks normal with attached edges. RN noticed patient has a mole in his nose, related with using CPAP at night, Pt has a stage I pressure ulcer in his back Pt has a Continuous PD catheter in his left side of the abdomen, HD unit notified regarding patient. Safety issues discussed with patient and family member and policy of the unit Call bell and important belongings within reach Pt is in comfortable position, initial Vitals done, initial assessment done Will continue to monitor the patient Lonia FarberRekha, RCharity fundraiser

## 2017-03-12 NOTE — Progress Notes (Signed)
Occupational Therapy Session Note  Patient Details  Name: [name] KitchenJames D Roark MRN: [id number] Date of Birth: 1940/09/16  Today's Date: 03/12/2017 OT Individual Time: 1130-1150 OT Individual Time Calculation (min): 20 min    Short Term Goals: Week 1:  OT Short Term Goal 1 (Week 1): Pt will transfer to drop arm BSC with mod assist with slide board. OT Short Term Goal 2 (Week 1): Pt will don pants in bed with min A. OT Short Term Goal 3 (Week 1): Pt will perform lateral leans on BSC with S to assist with clothing management.  Skilled Therapeutic Interventions/Progress Updates:    1:1. Pain in R knee/residual limb. RN aware. Pt already received medication. Pt willing to make up part of missed time to transfer into PWC. Pt supine>sitting EOB with MOD A for trunk elevation and A to facilitate roll to sidelying. Pt slide board transfer onn level surface with MAX A with VC for head hips relationship. Pt able to readjust hips in w/c with B armrest via reciprocal scooting backwards. Placed towel rolls to elevate residual limbs on board for comfort. Exited session with pt seated in w/c and wife present in room.  Therapy Documentation Precautions:  Precautions Precautions: Fall Restrictions Weight Bearing Restrictions: Yes RLE Weight Bearing: Non weight bearing RLE Partial Weight Bearing Percentage or Pounds: BKA  LLE Weight Bearing: Non weight bearing LLE Partial Weight Bearing Percentage or Pounds: BKA   See Function Navigator for Current Functional Status.   Therapy/Group: Individual Therapy  Shon HaleStephanie M Ellamay Fors 03/12/2017, 11:51 AM

## 2017-03-12 NOTE — Progress Notes (Addendum)
Joshua [name] is a 76 y.o. male [birth year] [id number]  Subjective: No new complaints this am. No new problems. Slept well. Feeling OK.  Objective: Vital signs in last 24 hours: Temp:  [97.6 F (36.4 C)-98.8 F (37.1 C)] 97.7 F (36.5 C) (12/02 1500) Pulse Rate:  [58-108] 96 (12/02 1500) Resp:  [16-20] 20 (12/02 1500) BP: (117-155)/(53-109) 123/53 (12/02 1500) SpO2:  [94 %-100 %] 96 % (12/02 1500) Weight:  [165 lb 5.5 oz (75 kg)-180 lb 12.4 oz (82 kg)] 171 lb 15.3 oz (78 kg) (12/02 1500) Weight change: 1 lb 13.7 oz (0.841 kg) Last BM Date: 03/11/17  Intake/Output from previous day: 12/01 0701 - 12/02 0700 In: [id number] [P.O.:640] Out: 16963  Last cbgs: CBG (last 3)  Recent Labs    03/12/17 1048 03/12/17 1218 03/12/17 1417  GLUCAP 388* 313* 159*     Physical Exam General: No apparent distress   HEENT: not dry Lungs: Normal effort. Lungs clear to auscultation, no crackles or wheezes. Cardiovascular: Regular rate and rhythm, no edema Abdomen: S/NT/ND; BS(+) Musculoskeletal:  unchanged Neurological: No new neurological deficits Wounds: dressed   Skin: clear  Aging changes Mental state: Alert, oriented, cooperative    Lab Results: BMET    Component Value Date/Time   NA 130 (L) 03/10/2017 1203   K 2.6 (LL) 03/10/2017 1203   CL 93 (L) 03/10/2017 1203   CO2 26 03/10/2017 1203   GLUCOSE 607 (HH) 03/11/2017 0730   BUN 49 (H) 03/10/2017 1203   CREATININE 5.06 (H) 03/10/2017 1203   CALCIUM 7.8 (L) 03/10/2017 1203   GFRNONAA 10 (L) 03/10/2017 1203   GFRAA 12 (L) 03/10/2017 1203   CBC    Component Value Date/Time   WBC 13.1 (H) 03/12/2017 1429   RBC 2.69 (L) 03/12/2017 1429   HGB 7.9 (L) 03/12/2017 1429   HCT 25.2 (L) 03/12/2017 1429   PLT 416 (H) 03/12/2017 1429   MCV 93.7 03/12/2017 1429   MCH 29.4 03/12/2017 1429   MCHC 31.3 03/12/2017 1429   RDW 16.1 (H) 03/12/2017 1429   LYMPHSABS 1.1 01/27/2017 0537   MONOABS 0.7 01/27/2017 0537   EOSABS 0.2  01/27/2017 0537   BASOSABS 0.0 01/27/2017 0537    Studies/Results: Dg Chest Port 1 View  Result Date: 03/12/2017 CLINICAL DATA:  Shortness of breath. EXAM: PORTABLE CHEST 1 VIEW COMPARISON:  03/05/2017 FINDINGS: 1328 hours. Low lung volumes. The cardio pericardial silhouette is enlarged. There is pulmonary vascular congestion without overt pulmonary edema. Left defibrillator again noted. Patient is status post CABG. The visualized bony structures of the thorax are intact. IMPRESSION: Cardiomegaly with vascular congestion. Electronically Signed   By: Kennith CenterEric  Mansell M.D.   On: 03/12/2017 13:58    Medications: I have reviewed the patient's current medications.  Assessment/Plan:    1. S/p R BKA; recent L BKA. CIR PT and OT 2. DVT proph - heparin 3. Post-op pain - Oxycodone 4. Anemia - on Aranesp 5. 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 CAD - Plavix, ASA 7. Diastolic CHF - monitor wt's 8. IDDM - poorly controlled post-op. We have been adjusting Lantus 9. HTN - Coreg 10. Dyslipidemia - on Lipitor 11. Constipation - LOC 12. Elevated WBC    Addendum 4:00 pm:  Early tis afternoon the pt was noted to be confused and tachycardic/tachepneic. EKG w/A fib and HR 103. O2 sats 98% RA. BP OK. No CP, cough, fever. CXR w/congestion. Labs w/elevated WBC.  Given po Cardizem CD  The pt was seen and examined.  Wife at bedside.  Alert and cooperative. HR irreg/irreg Decreased BS at bases No JVD    Will ask Inpatient service to admit the pt to telemetry. IV team is to see the pt 13. Confusion, acute 14. A fib with RVR - new. No response to po Cardizem CD yet 15. Dyspnea - new 16. Hypokalemia 17. Mild CHF     Length of stay, days: 6  Sonda PrimesAlex Plotnikov , MD 03/12/2017, 3:09 PM

## 2017-03-12 NOTE — Progress Notes (Signed)
Subjective:  Had acute SOB and tachycardia, rhythm showing afib, and CXR showing vasc congestion which is new. Moved to hospital for admission from CIR.  Now is stable, no c/o's except some SOB.  No CP.  Per pt's wife has been losing wt , no appetite.     Objective Vital signs in last 24 hours: Vitals:   03/12/17 1855  BP: (!) 119/47  Pulse: 99  Resp: 18  Temp: 98.8 F (37.1 C)  TempSrc: Oral  SpO2: 92%  Weight: 77 kg (169 lb 12.1 oz)  Height: 5\' 9"  (1.753 m)   Weight change:   Physical Exam: General:  Alert , NAD  ,OX3 appropriate  Heart: RRR; No m, r, g Lungs: CTA  bilat. Abdomen: soft, non-tender., non distended,  PD cath in place. Extremities:  L BKA and new R BKA  Dressing dry/clean with Ace wrap off . Has 1-2+ nonpitting edema of hips/ abd pannus Dialysis Access: PD cath,  L AVF + thrill, bruit not developed well   Dialysis Orders: Center: [place] [facility]. 6 exchanges/day one mid-day exchange (occurs around 9am).  2.5 liters of 2.5% per [facility] 2 liters of 2.5% for last fill and mid day drain. Dr. Denny PeonBaveja.   -baseline weight for him pre amputation was around 190lbs -home BP: coreg 6.25 mg qhs (only med)    Problem/Plan: 1. SOB - possible vol overload, has sig flank and hip edema, ^BP's and vasc congestion by CXR. Will ^PD to all 4.25% tonight, if not improved can consider HD using the AVF.  2. DM2- may need to [facility]^insulin w/ greater dextrose conc in PD fluid 3. Afib with RVR - per primary , try to correct low K 4. Severe hypoalbuminemia - not sure if malnutrition or PD related maybe? 5. PAD sp revision L BKA and new R BKA on 11/21 6. ESRD on [facility] in [place].  7. Hypokalemia - replacing 8. HTN/ vol - coreg 6.25mg  hs. 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 UTI - blood cx's neg, no urine cx in system. Sp short course abx. 11. Malnutrition - alb 1.3   Prostat, +Nepro.  12. Anemia of CKD -  getting darbe 150/wk here, Hb 7-9 range    Joshua Moselleob Joshua Grine MD [facility] pager [phone]   03/12/2017, 7:57 PM    Labs: Basic Metabolic Panel: Recent Labs  Lab 03/07/17 0555  03/10/17 1203 03/11/17 0730 03/12/17 1429  NA 130*  --  130*  --  136  K 3.6  --  2.6*  --  2.3*  CL 95*  --  93*  --  99*  CO2 23  --  26  --  29  GLUCOSE 383*   < > 328* 607* 141*  BUN 43*  --  49*  --  52*  CREATININE 4.71*  --  5.06*  --  5.03*  CALCIUM 7.5*  --  7.8*  --  8.0*  PHOS 3.7  --  3.8  --   --    < > = values in this interval not displayed.   Liver Function Tests: Recent Labs  Lab 03/07/17 0555 03/10/17 1203 03/12/17 1429  AST  --   --  49*  ALT  --   --  42  ALKPHOS  --   --  87  BILITOT  --   --  0.5  PROT  --   --  5.3*  ALBUMIN 1.3* 1.4* 1.3*   No results for input(s): LIPASE, AMYLASE in the last 168 hours. No results for input(s): AMMONIA in the last 168 hours. CBC: Recent Labs  Lab 03/06/17 0205 03/10/17 1203 03/12/17 1429  WBC 12.5* 10.0 13.1*  HGB 8.1* 8.0* 7.9*  HCT 25.6* 24.8* 25.2*  MCV 94.8 93.9 93.7  PLT 300 355 416*   Cardiac Enzymes: No results for input(s): CKTOTAL, CKMB, CKMBINDEX, TROPONINI in the last 168 hours. CBG: Recent Labs  Lab 03/12/17 0933 03/12/17 1048 03/12/17 1218 03/12/17 1417 03/12/17 1656  GLUCAP 470* 388* 313* 159* 100*    Studies/Results: Dg Chest Port 1 View  Result Date: 03/12/2017 CLINICAL DATA:  Shortness of breath. EXAM: PORTABLE CHEST 1 VIEW COMPARISON:  03/05/2017 FINDINGS: 1328 hours. Low lung volumes. The cardio pericardial silhouette is enlarged. There is pulmonary vascular congestion without overt pulmonary edema. Left defibrillator again noted. Patient is status post CABG. The visualized bony structures of the thorax are intact. IMPRESSION: Cardiomegaly with vascular congestion. Electronically Signed   By: Kennith CenterEric  Mansell M.D.   On: 03/12/2017 13:58   Medications:  . gentamicin cream  1  application Topical Daily

## 2017-03-13 ENCOUNTER — Inpatient Hospital Stay (HOSPITAL_COMMUNITY): Payer: Medicare Other

## 2017-03-13 ENCOUNTER — Inpatient Hospital Stay (HOSPITAL_COMMUNITY): Payer: Medicare Other | Admitting: Occupational Therapy

## 2017-03-13 ENCOUNTER — Inpatient Hospital Stay (HOSPITAL_BASED_OUTPATIENT_CLINIC_OR_DEPARTMENT_OTHER): Payer: Medicare Other

## 2017-03-13 ENCOUNTER — Other Ambulatory Visit: Payer: Self-pay

## 2017-03-13 DIAGNOSIS — E7219 Other disorders of sulfur-bearing amino-acid metabolism: Secondary | ICD-10-CM

## 2017-03-13 DIAGNOSIS — N186 End stage renal disease: Secondary | ICD-10-CM

## 2017-03-13 DIAGNOSIS — Z794 Long term (current) use of insulin: Secondary | ICD-10-CM

## 2017-03-13 DIAGNOSIS — I2510 Atherosclerotic heart disease of native coronary artery without angina pectoris: Secondary | ICD-10-CM

## 2017-03-13 DIAGNOSIS — Z992 Dependence on renal dialysis: Secondary | ICD-10-CM

## 2017-03-13 DIAGNOSIS — I498 Other specified cardiac arrhythmias: Secondary | ICD-10-CM

## 2017-03-13 DIAGNOSIS — Z89511 Acquired absence of right leg below knee: Secondary | ICD-10-CM

## 2017-03-13 DIAGNOSIS — J9601 Acute respiratory failure with hypoxia: Secondary | ICD-10-CM

## 2017-03-13 DIAGNOSIS — E1165 Type 2 diabetes mellitus with hyperglycemia: Secondary | ICD-10-CM | POA: Diagnosis not present

## 2017-03-13 DIAGNOSIS — Z89512 Acquired absence of left leg below knee: Secondary | ICD-10-CM | POA: Diagnosis not present

## 2017-03-13 DIAGNOSIS — I509 Heart failure, unspecified: Secondary | ICD-10-CM

## 2017-03-13 LAB — GLUCOSE, CAPILLARY
GLUCOSE-CAPILLARY: 480 mg/dL — AB (ref 65–99)
GLUCOSE-CAPILLARY: 274 mg/dL — AB (ref 65–99)
GLUCOSE-CAPILLARY: 256 mg/dL — AB (ref 65–99)
Glucose-Capillary: 592 mg/dL (ref 65–99)

## 2017-03-13 LAB — TROPONIN I
TROPONIN I: 0.31 ng/mL — AB (ref <0.03)
TROPONIN I: 0.45 ng/mL — AB (ref <0.03)
Troponin I: 0.41 ng/mL (ref <0.03)

## 2017-03-13 LAB — BASIC METABOLIC PANEL
Anion gap: 11 (ref 5–15)
BUN: 49 mg/dL — ABNORMAL HIGH (ref 6–20)
CALCIUM: 8.2 mg/dL — AB (ref 8.9–10.3)
CO2: 27 mmol/L (ref 22–32)
CREATININE: 5.22 mg/dL — AB (ref 0.61–1.24)
Chloride: 94 mmol/L — ABNORMAL LOW (ref 101–111)
GFR calc non Af Amer: 10 mL/min — ABNORMAL LOW (ref >60)
GFR, EST AFRICAN AMERICAN: 11 mL/min — AB (ref >60)
Glucose, Bld: 624 mg/dL (ref 65–99)
Potassium: 3 mmol/L — ABNORMAL LOW (ref 3.5–5.1)
SODIUM: 132 mmol/L — AB (ref 135–145)

## 2017-03-13 LAB — URINALYSIS, ROUTINE W REFLEX MICROSCOPIC
BACTERIA UA: NONE SEEN
BILIRUBIN URINE: NEGATIVE
Bilirubin Urine: NEGATIVE
Glucose, UA: >=500 mg/dL — AB
Ketones, ur: NEGATIVE mg/dL
Ketones, ur: NEGATIVE mg/dL
NITRITE: NEGATIVE
Nitrite: NEGATIVE
PROTEIN: 100 mg/dL — AB
Protein, ur: 100 mg/dL — AB
SPECIFIC GRAVITY, URINE: 1.016 (ref 1.005–1.030)
SQUAMOUS EPITHELIAL / LPF: NONE SEEN
Specific Gravity, Urine: 1.014 (ref 1.005–1.030)
pH: 5 (ref 5.0–8.0)
pH: 6 (ref 5.0–8.0)

## 2017-03-13 LAB — CBC
HCT: 28.7 % — ABNORMAL LOW (ref 39.0–52.0)
Hemoglobin: 9 g/dL — ABNORMAL LOW (ref 13.0–17.0)
MCH: 29.9 pg (ref 26.0–34.0)
MCHC: 31.4 g/dL (ref 30.0–36.0)
MCV: 95.3 fL (ref 78.0–100.0)
PLATELETS: 457 10*3/uL — AB (ref 150–400)
RBC: 3.01 MIL/uL — AB (ref 4.22–5.81)
RDW: 16.1 % — AB (ref 11.5–15.5)
WBC: 9.6 10*3/uL (ref 4.0–10.5)

## 2017-03-13 LAB — ECHOCARDIOGRAM COMPLETE
Height: 69 in
WEIGHTICAEL: 2398.6 [oz_av]

## 2017-03-13 LAB — MAGNESIUM: Magnesium: 1.7 mg/dL (ref 1.7–2.4)

## 2017-03-13 LAB — HEPARIN LEVEL (UNFRACTIONATED): HEPARIN UNFRACTIONATED: 0.13 [IU]/mL — AB (ref 0.30–0.70)

## 2017-03-13 LAB — GLUCOSE, RANDOM: GLUCOSE: 750 mg/dL — AB (ref 65–99)

## 2017-03-13 LAB — LACTIC ACID, PLASMA: Lactic Acid, Venous: 2.2 mmol/L (ref 0.5–1.9)

## 2017-03-13 MED ORDER — INSULIN ASPART 100 UNIT/ML ~~LOC~~ SOLN
0.0000 [IU] | Freq: Three times a day (TID) | SUBCUTANEOUS | Status: DC
  Administered 2017-03-13: 5 [IU] via SUBCUTANEOUS
  Administered 2017-03-14: 8 [IU] via SUBCUTANEOUS
  Administered 2017-03-14: 15 [IU] via SUBCUTANEOUS

## 2017-03-13 MED ORDER — INSULIN NPH (HUMAN) (ISOPHANE) 100 UNIT/ML ~~LOC~~ SUSP
55.0000 [IU] | Freq: Every day | SUBCUTANEOUS | Status: DC
  Administered 2017-03-13: 55 [IU] via SUBCUTANEOUS
  Filled 2017-03-13: qty 10

## 2017-03-13 MED ORDER — INSULIN ASPART 100 UNIT/ML ~~LOC~~ SOLN
0.0000 [IU] | Freq: Every day | SUBCUTANEOUS | Status: DC
  Administered 2017-03-13: 3 [IU] via SUBCUTANEOUS

## 2017-03-13 MED ORDER — HEPARIN (PORCINE) IN NACL 100-0.45 UNIT/ML-% IJ SOLN
1300.0000 [IU]/h | INTRAMUSCULAR | Status: DC
  Administered 2017-03-13: 1300 [IU]/h via INTRAVENOUS
  Filled 2017-03-13: qty 250

## 2017-03-13 MED ORDER — DILTIAZEM HCL ER COATED BEADS 120 MG PO CP24
120.0000 mg | ORAL_CAPSULE | Freq: Every day | ORAL | Status: DC
  Administered 2017-03-13: 120 mg via ORAL
  Filled 2017-03-13: qty 1

## 2017-03-13 MED ORDER — INSULIN GLARGINE 100 UNIT/ML ~~LOC~~ SOLN: 40.0000 [IU] | Freq: Every day | SUBCUTANEOUS | 11 refills | Status: DC

## 2017-03-13 MED ORDER — INSULIN NPH (HUMAN) (ISOPHANE) 100 UNIT/ML ~~LOC~~ SUSP
65.0000 [IU] | Freq: Every day | SUBCUTANEOUS | Status: DC
  Filled 2017-03-13: qty 10

## 2017-03-13 MED ORDER — INSULIN NPH (HUMAN) (ISOPHANE) 100 UNIT/ML ~~LOC~~ SUSP
50.0000 [IU] | Freq: Every day | SUBCUTANEOUS | Status: DC
  Administered 2017-03-13: 50 [IU] via SUBCUTANEOUS
  Filled 2017-03-13: qty 10

## 2017-03-13 MED ORDER — INSULIN ASPART 100 UNIT/ML ~~LOC~~ SOLN
10.0000 [IU] | Freq: Once | SUBCUTANEOUS | Status: AC
  Administered 2017-03-13: 10 [IU] via SUBCUTANEOUS

## 2017-03-13 MED ORDER — CARVEDILOL 12.5 MG PO TABS
12.5000 mg | ORAL_TABLET | Freq: Two times a day (BID) | ORAL | Status: DC
  Administered 2017-03-13 – 2017-03-14 (×2): 12.5 mg via ORAL
  Filled 2017-03-13 (×2): qty 1

## 2017-03-13 MED ORDER — CARVEDILOL 12.5 MG PO TABS: 12.5000 mg | ORAL_TABLET | Freq: Every day | ORAL | Status: DC

## 2017-03-13 MED ORDER — INSULIN NPH (HUMAN) (ISOPHANE) 100 UNIT/ML ~~LOC~~ SUSP
55.0000 [IU] | Freq: Every day | SUBCUTANEOUS | Status: DC
  Filled 2017-03-13: qty 10

## 2017-03-13 NOTE — Progress Notes (Signed)
ANTICOAGULATION CONSULT NOTE - Initial Consult  Pharmacy Consult for heparin Indication: atrial fibrillation  Allergies  Allergen Reactions  . Gabapentin (Once-Daily) Hypertension  . Lisinopril Hives  . Methotrexate Derivatives Other (See Comments)    blisters  . Sulfa Antibiotics Hives  . Tape Hives  . Vancomycin Other (See Comments)    Blisters   . Zanaflex [Tizanidine Hcl]     Hypotensive stroke    Patient Measurements: Height: 5\' 9"  (175.3 cm) Weight: 77kg IBW/kg (Calculated) : 70.7  Vital Signs: Temp: 97.9 F (36.6 C) (12/03 0618) Temp Source: Oral (12/03 0618) BP: 129/53 (12/03 0618) Pulse Rate: 86 (12/03 0618)  Labs: Recent Labs    03/10/17 1203 03/12/17 1429 03/12/17 2008 03/12/17 2333  HGB 8.0* 7.9*  --   --   HCT 24.8* 25.2*  --   --   PLT 355 416*  --   --   CREATININE 5.06* 5.03*  --   --   TROPONINI  --   --  0.52* 0.45*    Estimated Creatinine Clearance: 12.5 mL/min (A) (by C-G formula based on SCr of 5.03 mg/dL (H)).   Medical History: Past Medical History:  Diagnosis Date  . AICD (automatic cardioverter/defibrillator) present    [employer] [phone] lead [id number]  . CHF (congestive heart failure) (HCC)   . Complication of anesthesia   . COPD (chronic obstructive pulmonary disease) (HCC)   . Coronary artery disease    CABG 2011  . Diabetes mellitus without complication (HCC)   . ESRD on peritoneal dialysis [facility](HCC)    [place]   . Gangrene (HCC)    left knee  . GERD (gastroesophageal reflux disease)   . History of peritonitis    PD cath related peritonitis in April 2017  . Hypertension   . Peripheral vascular disease (HCC)   . PONV (postoperative nausea and vomiting)   . Sjogren's syndrome (HCC)    Per pt's wife, was diagnosed in 2016 during w/u for cause of renal failure prior to starting dialysis.  He had a rash on his chest apparently prompting this w/u.  Never had  renal bx.  He was referred to a rheum [place] in [place] and per the wife he was treated with MTX and other medications but had side effects to "all of it" and isn't taking anything for this as of Jun 2017.   [name]. Sleep apnea    wears Bipap  . Stroke (HCC)   . Wound dehiscence    Right foot    Medications:  Medications Prior to Admission  Medication Sig Dispense Refill Last Dose  . aspirin EC 81 MG tablet Take 81 mg by mouth daily.   03/01/2017 at 0600  . atorvastatin (LIPITOR) 80 MG tablet Take 1 tablet (80 mg total) by mouth daily at 6 PM. 30 tablet 0 02/28/2017 at Unknown time  . calcium acetate (PHOSLO) 667 MG capsule Take 1 capsule (667 mg total) by mouth 3 (three) times daily with meals. 90 capsule 0 02/28/2017 at Unknown time  . carvedilol (COREG) 6.25 MG tablet Take 1 tablet (6.25 mg total) by mouth at bedtime. 30 tablet 0 02/28/2017 at Unknown time  . clopidogrel (PLAVIX) 75 MG tablet Take 1 tablet (75 mg total) by mouth daily. 30 tablet 0 03/01/2017 at 0600  . cyclobenzaprine (FLEXERIL) 10 MG tablet Take 1 tablet (10 mg total) 2 (two) times daily as needed by mouth for muscle  spasms. 30 tablet 0 02/28/2017 at Unknown time  . docusate sodium (COLACE) 100 MG capsule Take 100 mg by mouth 2 (two) times daily.    02/28/2017 at Unknown time  . insulin NPH Human (HUMULIN N,NOVOLIN N) 100 UNIT/ML injection Inject 0.1 mLs (10 Units total) into the skin daily before breakfast. 10 mL 11 02/28/2017 at Unknown time  . insulin NPH Human (HUMULIN N,NOVOLIN N) 100 UNIT/ML injection Inject 0.65 mLs (65 Units total) into the skin at bedtime. 10 mL 11 02/28/2017 at Unknown time  . multivitamin (RENA-VIT) TABS tablet Take 1 tablet by mouth at bedtime.  0 02/28/2017 at Unknown time  . nitroGLYCERIN (NITROSTAT) 0.4 MG SL tablet Place 0.4 mg under the tongue every 5 (five) minutes as needed for chest pain.   More than a month at Unknown time  . oxyCODONE (OXY IR/ROXICODONE) 5 MG immediate release tablet Take 0.5  tablets (2.5 mg total) by mouth every 6 (six) hours as needed for severe pain. 20 tablet 0 02/28/2017 at Unknown time  . polyethylene glycol (MIRALAX / GLYCOLAX) packet Take 17 g by mouth daily.   02/28/2017 at Unknown time  . RELION INSULIN SYRINGE 31G X 15/64" 1 ML MISC    02/28/2017 at Unknown time  . silver sulfADIAZINE (SILVADENE) 1 % cream Apply 1 application daily topically. 50 g 0 02/28/2017 at Unknown time  . Vitamin D, Ergocalciferol, (DRISDOL) 50000 units CAPS capsule Take 50,000 Units by mouth every 30 (thirty) days.    02/28/2017 at Unknown time   Scheduled:  . aspirin EC  81 mg Oral Daily  . atorvastatin  80 mg Oral q1800  . calcium acetate  667 mg Oral TID WC  . carvedilol  6.25 mg Oral QHS  . clopidogrel  75 mg Oral Daily  . diltiazem  120 mg Oral Daily  . docusate sodium  100 mg Oral BID  . gentamicin cream  1 application Topical Daily  . insulin aspart  0-9 Units Subcutaneous TID WC  . insulin glargine  20 Units Subcutaneous QHS  . multivitamin  1 tablet Oral QHS  . polyethylene glycol  17 g Oral Daily  . potassium chloride  30 mEq Oral Daily  . potassium chloride  40 mEq Oral QID  . [START ON 03/30/2017] Vitamin D (Ergocalciferol)  50,000 Units Oral Q30 days    Assessment: 76yo male admitted 11/21 for amputation revision, tx'd to inpatient rehab, now readmitted for SOB and confusion, to start heparin for Afib w/ RVR and concern for PE.  Goal of Therapy:  Heparin level 0.3-0.7 units/ml Monitor platelets by anticoagulation protocol: Yes   Plan:  Rec'd SQ heparin ~1hr ago; will start heparin gtt at 1300 units/hr (previously therapeutic at this rate) and monitor heparin levels and CBC.  Vernard GamblesVeronda Monicka Cyran, PharmD, BCPS  03/13/2017,7:28 AM

## 2017-03-13 NOTE — Progress Notes (Signed)
Orthopedic Tech Progress Note Patient Details:  [name] KitchenJames D Guay 04-10-1941 [id number]  Patient ID: [name] KitchenJames D Llorens, male   DOB: 04-10-1941, [age] y.o.   MRN: [id number]   Nikki DomCrawford, Leandra Vanderweele 03/13/2017, 9:16 AM Called in bio-tech brace order; spoke with Wylene MenLacey

## 2017-03-13 NOTE — Consult Note (Signed)
Cardiology Consultation:   Patient ID: [name]; [id number]; July 24, 1940   Admit date: 03/12/2017 Date of Consult: 03/13/2017  Primary Care Provider: Dorice [name], MD Primary Cardiologist: No primary care provider on file.  [facility], Dr. Wilford Grist   Patient Profile:   [name] is a 76 y.o. male with a hx of severe coronary artery disease who is being seen today for the evaluation of possible wandering atrial pacemaker versus atrial fibrillation at the request of Dr. Butler Denmark.  History of Present Illness:   [name] is a 76 year old male with complicated past medical history including coronary artery disease, prior stent to RCA in 07/2016 in the setting of non-STEMI, ischemic cardiomyopathy status post [employer] ICD placement in August 2018 with hypertension hyperlipidemia end-stage renal disease on peritoneal dialysis and severe peripheral arterial disease with amputations who we were asked to see for clarification of possible diagnosis of either wandering atrial pacemaker or atrial fibrillation.  Currently he is complaining mostly of fatigue, denies any chest pain or significant shortness of breath.  EF has been 35%.  Extensive review of EKGs and telemetry have been performed as well as interrogation of his ICD.  Past Medical History:  Diagnosis Date  . AICD (automatic cardioverter/defibrillator) present    [employer] [id number] lead [id number]  . CHF (congestive heart failure) (HCC)   . Complication of anesthesia   . COPD (chronic obstructive pulmonary disease) (HCC)   . Coronary artery disease    CABG 2011  . Diabetes mellitus without complication (HCC)   . ESRD on peritoneal dialysis [facility])    Started PD Dec 2016 in [place]. Nephrology is in [place].   . Gangrene (HCC)    left knee  . GERD (gastroesophageal reflux disease)   . History of peritonitis    PD cath related peritonitis in April 2017  . Hypertension   . Peripheral  vascular disease (HCC)   . PONV (postoperative nausea and vomiting)   . Sjogren's syndrome (HCC)    Per pt's wife, was diagnosed in 2016 during w/u for cause of renal failure prior to starting dialysis.  He had a rash on his chest apparently prompting this w/u.  Never had renal bx.  He was referred to a rheum MD in [place] and per the wife he was treated with MTX and other medications but had side effects to "all of it" and isn't taking anything for this as of Jun 2017.   [name] Sleep apnea    wears Bipap  . Stroke (HCC)   . Wound dehiscence    Right foot    Past Surgical History:  Procedure Laterality Date  . ABDOMINAL AORTOGRAM W/LOWER EXTREMITY N/A 11/29/2016   Procedure: ABDOMINAL AORTOGRAM W/LOWER EXTREMITY;  Surgeon: Nada Libman, MD;  [facility];  Service: Cardiovascular;  Laterality: N/A;  . AMPUTATION Left 11/29/2016   Procedure: LEFT THIRD TOE AMPUTATION;  Surgeon: Nada Libman, MD;  Location: [facility] OR;  Service: Vascular;  Laterality: Left;  . AMPUTATION Left 12/23/2016   Procedure: Left Transmetatarsal Amputation;  Surgeon: Nadara Mustard, MD;  Location: [facility] OR;  Service: Orthopedics;  Laterality: Left;  . AMPUTATION Left 01/11/2017   Procedure: LEFT BELOW KNEE AMPUTATION;  Surgeon: Nadara Mustard, MD;  Location: [facility] OR;  Service: Orthopedics;  Laterality: Left;  . AMPUTATION Right 01/20/2017   Procedure: AMPUTATION RIGHT FIFTH TOE;  Surgeon: Nada Libman, MD;  Location: [facility] OR;  Service: Vascular;  Laterality: Right;  . AMPUTATION Right 03/01/2017   Procedure: RIGHT BELOW KNEE AMPUTATION;  Surgeon: [name], Marcus V, MD;  [facility];  Service: Orthopedics;  Laterality: Right;  . ANGIOPLASTY  12/15/2016   Procedure: BALLOON ANGIOPLASTY;  Surgeon: Maeola Harmanain, Brandon Christopher, MD;  [facility];  Service: Vascular;;  . cardiac catherization with stent placement  2017  . COLONOSCOPY W/ BIOPSIES AND POLYPECTOMY    . CORONARY ARTERY BYPASS GRAFT  2011  . FALSE  ANEURYSM REPAIR Right 12/15/2016   Procedure: REPAIR FALSE ANEURYSM;  Surgeon: Maeola Harmanain, Brandon Christopher, MD;  [facility];  Service: Vascular;  Laterality: Right;  . IRRIGATION AND DEBRIDEMENT FOOT Left 12/15/2016   Procedure: IRRIGATION AND DEBRIDEMENT FOOT;  Surgeon: Maeola Harmanain, Brandon Christopher, MD;  [facility];  Service: Vascular;  Laterality: Left;  . LEFT HEART CATH AND CORS/GRAFTS ANGIOGRAPHY N/A 01/09/2017   Procedure: LEFT HEART CATH AND CORS/GRAFTS ANGIOGRAPHY;  Surgeon: Runell GessBerry, Jonathan J, MD;  [facility];  Service: Cardiovascular;  Laterality: N/A;  . LOWER EXTREMITY ANGIOGRAM Left 12/15/2016   Procedure: LOWER EXTREMITY ANGIOGRAM; PEDAL ACCESS;  Surgeon: Maeola Harmanain, Brandon Christopher, MD;  [facility];  Service: Vascular;  Laterality: Left;  . PERIPHERAL VASCULAR BALLOON ANGIOPLASTY  11/29/2016   Procedure: PERIPHERAL VASCULAR BALLOON ANGIOPLASTY;  Surgeon: Nada LibmanBrabham, Vance W, MD;  [facility];  Service: Cardiovascular;;  LT Peroneal AT attempted unsuccessful  . STUMP REVISION Left 03/01/2017   Procedure: REVISION LEFT BELOW KNEE AMPUTATION;  Surgeon: [name], Marcus V, MD;  [facility];  Service: Orthopedics;  Laterality: Left;     Home Medications:  Prior to Admission medications   Medication Sig Start Date End Date Taking? Authorizing Provider  aspirin EC 81 MG tablet Take 81 mg by mouth daily.    [provider]  atorvastatin (LIPITOR) 80 MG tablet Take 1 tablet (80 mg total) by mouth daily at 6 PM. 02/07/17   Angiulli, Mcarthur Rossettianiel J, PA-C  calcium acetate (PHOSLO) 667 MG capsule Take 1 capsule (667 mg total) by mouth 3 (three) times daily with meals. 02/07/17   Angiulli, Mcarthur Rossettianiel J, PA-C  carvedilol (COREG) 6.25 MG tablet Take 1 tablet (6.25 mg total) by mouth at bedtime. 02/07/17   Angiulli, Mcarthur Rossettianiel J, PA-C  clopidogrel (PLAVIX) 75 MG tablet Take 1 tablet (75 mg total) by mouth daily. 02/07/17   Angiulli, Mcarthur Rossettianiel J, PA-C  cyclobenzaprine  (FLEXERIL) 10 MG tablet Take 1 tablet (10 mg total) 2 (two) times daily as needed by mouth for muscle spasms. 02/14/17   Adonis HugueninZamora, Erin R, NP  docusate sodium (COLACE) 100 MG capsule Take 100 mg by mouth 2 (two) times daily.     [provider]  insulin NPH Human (HUMULIN N,NOVOLIN N) 100 UNIT/ML injection Inject 0.1 mLs (10 Units total) into the skin daily before breakfast. 02/07/17   Angiulli, Mcarthur Rossettianiel J, PA-C  insulin NPH Human (HUMULIN N,NOVOLIN N) 100 UNIT/ML injection Inject 0.65 mLs (65 Units total) into the skin at bedtime. 02/07/17   Angiulli, Mcarthur Rossettianiel J, PA-C  multivitamin (RENA-VIT) TABS tablet Take 1 tablet by mouth at bedtime. 01/17/17   Narda BondsNettey, Ralph A, MD  nitroGLYCERIN (NITROSTAT) 0.4 MG SL tablet Place 0.4 mg under the tongue every 5 (five) minutes as needed for chest pain.    [provider]  oxyCODONE (OXY IR/ROXICODONE) 5 MG immediate release tablet Take 0.5 tablets (2.5 mg total) by mouth every 6 (six) hours as needed for severe pain. 02/07/17   Angiulli,  Mcarthur Rossetti, PA-C  polyethylene glycol (MIRALAX / GLYCOLAX) packet Take 17 g by mouth daily. 01/18/17   Narda Bonds, MD  RELION INSULIN SYRINGE 31G X 15/64" 1 ML MISC  02/26/17   [provider]  silver sulfADIAZINE (SILVADENE) 1 % cream Apply 1 application daily topically. 02/23/17   Nadara Mustard, MD  Vitamin D, Ergocalciferol, (DRISDOL) 50000 units CAPS capsule Take 50,000 Units by mouth every 30 (thirty) days.     [provider]    Inpatient Medications: Scheduled Meds: . aspirin EC  81 mg Oral Daily  . atorvastatin  80 mg Oral q1800  . calcium acetate  667 mg Oral TID WC  . carvedilol  6.25 mg Oral QHS  . clopidogrel  75 mg Oral Daily  . diltiazem  120 mg Oral Daily  . docusate sodium  100 mg Oral BID  . gentamicin cream  1 application Topical Daily  . insulin aspart  0-9 Units Subcutaneous TID WC  . insulin NPH Human  55 Units Subcutaneous QAC breakfast  . multivitamin  1 tablet Oral  QHS  . polyethylene glycol  17 g Oral Daily  . potassium chloride  30 mEq Oral Daily  . [START ON 03/30/2017] Vitamin D (Ergocalciferol)  50,000 Units Oral Q30 days   Continuous Infusions: . heparin 1,300 Units/hr (03/13/17 0833)   PRN Meds: acetaminophen **OR** acetaminophen, cyclobenzaprine, nitroGLYCERIN, ondansetron **OR** ondansetron (ZOFRAN) IV, oxyCODONE  Allergies:    Allergies  Allergen Reactions  . Gabapentin (Once-Daily) Hypertension  . Lisinopril Hives  . Methotrexate Derivatives Other (See Comments)    blisters  . Sulfa Antibiotics Hives  . Tape Hives  . Vancomycin Other (See Comments)    Blisters   . Zanaflex [Tizanidine Hcl]     Hypotensive stroke    Social History:   Social History   Socioeconomic History  . Marital status: Married    Spouse name: Not on file  . Number of children: Not on file  . Years of education: Not on file  . Highest education level: Not on file  Social Needs  . Financial resource strain: Not on file  . Food insecurity - worry: Not on file  . Food insecurity - inability: Not on file  . Transportation needs - medical: Not on file  . Transportation needs - non-medical: Not on file  Occupational History  . Not on file  Tobacco Use  . Smoking status: Former Smoker    Types: Pipe  . Smokeless tobacco: Never Used  . Tobacco comment: 30 years ago 02/28/17  Substance and Sexual Activity  . Alcohol use: No  . Drug use: No  . Sexual activity: No  Other Topics Concern  . Not on file  Social History Narrative  . Not on file    Family History:    Family History  Problem Relation Age of Onset  . Heart disease Father   . Diabetes Mother      ROS:  Please see the history of present illness.  ROS  All other ROS reviewed and negative.     Physical Exam/Data:   Vitals:   03/12/17 2251 03/13/17 0118 03/13/17 0618 03/13/17 1017  BP: (!) 141/59 (!) 93/46 (!) 129/53 (!) 122/48  Pulse: 94 78 86 82  Resp: 18  16 18   Temp:    97.9 F (36.6 C) 98 F (36.7 C)  TempSrc:   Oral Oral  SpO2: 98% 93% 97%   Weight:    149 lb 14.6  oz (68 kg)  Height:        Intake/Output Summary (Last 24 hours) at 03/13/2017 1029 Last data filed at 03/13/2017 0911 Gross per 24 hour  Intake 120 ml  Output 150 ml  Net -30 ml   Filed Weights   03/12/17 1855 03/13/17 1017  Weight: 169 lb 12.1 oz (77 kg) 149 lb 14.6 oz (68 kg)   Body mass index is 22.14 kg/m.  General: Overall ill-appearing, tired, in no acute distress HEENT: normal Lymph: no adenopathy Neck: no JVD Endocrine:  No thryomegaly Vascular: No carotid bruits;   Cardiac:  normal S1, S2; irregular; no murmur, ICD in place Lungs:  clear to auscultation bilaterally, no wheezing, rhonchi or rales  Abd: soft, nontender, no hepatomegaly  Ext: Amputations noted Musculoskeletal: Amputations noted Skin: warm and dry  Neuro:  CNs 2-12 intact, no focal abnormalities noted Psych: Fairly tired, somber  EKG:  The EKG was personally reviewed and demonstrates: Multiple EKGs were reviewed demonstrating wandering atrial pacemaker.  There are clear P waves preceding QRS complexes with different morphologies, occasionally during periods of tachycardia, P waves are buried within the T waves.  Telemetry:  Telemetry was personally reviewed and demonstrates: Clearly in sinus rhythm currently.  During periods of irregularity and tachycardia one can still see P waves periodically preceding QRS complexes or buried within T waves.  Wandering atrial pacemaker.  Relevant CV Studies:  Defibrillator interrogation reviewed.  Laboratory Data:  Chemistry Recent Labs  Lab 03/10/17 1203 03/11/17 0730 03/12/17 1429 03/13/17 0516  NA 130*  --  136 132*  K 2.6*  --  2.3* 3.0*  CL 93*  --  99* 94*  CO2 26  --  29 27  GLUCOSE 328* 607* 141* 624*  BUN 49*  --  52* 49*  CREATININE 5.06*  --  5.03* 5.22*  CALCIUM 7.8*  --  8.0* 8.2*  GFRNONAA 10*  --  10* 10*  GFRAA 12*  --  12* 11*    ANIONGAP 11  --  8 11    Recent Labs  Lab 03/07/17 0555 03/10/17 1203 03/12/17 1429  PROT  --   --  5.3*  ALBUMIN 1.3* 1.4* 1.3*  AST  --   --  49*  ALT  --   --  42  ALKPHOS  --   --  87  BILITOT  --   --  0.5   Hematology Recent Labs  Lab 03/10/17 1203 03/12/17 1429 03/13/17 0516  WBC 10.0 13.1* 9.6  RBC 2.64* 2.69* 3.01*  HGB 8.0* 7.9* 9.0*  HCT 24.8* 25.2* 28.7*  MCV 93.9 93.7 95.3  MCH 30.3 29.4 29.9  MCHC 32.3 31.3 31.4  RDW 16.0* 16.1* 16.1*  PLT 355 416* 457*   Cardiac Enzymes Recent Labs  Lab 03/12/17 2008 03/12/17 2333 03/13/17 0516  TROPONINI 0.52* 0.45* 0.41*   No results for input(s): TROPIPOC in the last 168 hours.  BNPNo results for input(s): BNP, PROBNP in the last 168 hours.  DDimer  Recent Labs  Lab 03/12/17 2008  DDIMER 1.27*    Radiology/Studies:  Dg Chest Port 1 View  Result Date: 03/12/2017 CLINICAL DATA:  Shortness of breath. EXAM: PORTABLE CHEST 1 VIEW COMPARISON:  03/05/2017 FINDINGS: 1328 hours. Low lung volumes. The cardio pericardial silhouette is enlarged. There is pulmonary vascular congestion without overt pulmonary edema. Left defibrillator again noted. Patient is status post CABG. The visualized bony structures of the thorax are intact. IMPRESSION: Cardiomegaly with vascular congestion. Electronically Signed  By: Kennith Center M.D.   On: 03/12/2017 13:58    Assessment and Plan:   Wandering atrial pacemaker -Extensive data review, telemetry review, EKG reviews all demonstrate wandering atrial pacemaker/multifocal atrial tachycardia with no evidence of atrial fibrillation.  There are clear P waves preceding QRS complexes and during areas of tachycardia the P waves are buried within the T waves. -Defibrillator interrogation only demonstrates irregularity of ventricular beats because there is no atrial lead present therefore this data is not helpful in this diagnosis.  Clearly telemetry and EKG however demonstrate P waves,  showing wandering atrial pacemaker. -Since there is no evidence of atrial fibrillation, no anticoagulation is needed  Ischemic cardiomyopathy -EF 35%, fluid controlled by dialysis. -Given his current use of carvedilol 6.25 mg twice a day, I would increase this to 12.5 mg twice a day to help control his rate.  Try to avoid diltiazem in the setting of cardiomyopathy.  I discussed this with primary team.  End-stage renal disease -On peritoneal dialysis. -Repleting potassium  We will sign off, please let us know we can be of further assistance.  For questions or updates, please contact [facility] Please consult [email or web address] for contact info under Cardiology/STEMI.   Signed, Donato Schultz, MD  03/13/2017 10:29 AM

## 2017-03-13 NOTE — Progress Notes (Signed)
[facility] Progress Note   Assessment/ Plan:   1. SOB - possible vol overload, has sig flank and hip edema, BP and edema improved with 4.25%.  Will use a mix of 4.25% and 2.5% tonight   2. DM2- glucoses quite elevated, insulin increased per primary  3. Afib with RVR - per primary , try to correct low K 4. Severe hypoalbuminemia - not sure if malnutrition or PD related maybe? 5. PAD sp revision L BKA and new R BKA on 11/21 6. [place] on [place] in [place].  7. Hypokalemia - replacing 8. HTN/ vol - coreg 6.25mg  hs. 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 UTI - blood cx's neg, no urine cx in system. Sp short course abx. 11. Malnutrition - alb 1.3   Prostat, +Nepro.  12. Anemia of CKD - getting darbe 150/wk here, Hb 7-9 range     Subjective:    Breathing is better today.  Pt looks quite dejected.     Objective:   BP (!) 122/48 (BP Location: Right Arm)   Pulse 82   Temp 98 F (36.7 C) (Oral)   Resp 18   Ht 5\' 9"  (1.753 m)   Wt 68 kg (149 lb 14.6 oz)   SpO2 97%   BMI 22.14 kg/m   Physical Exam: General:  Alert ,NAD, OX3 appropriate  Heart: RRR; No m, r, g Lungs: CTA bilat. Abdomen: soft, non-tender., non distended, PD cath in place. Extremities: L BKA and new R BKA Dressing dry/clean with Ace wrap off . Ede,a slightly better Dialysis Access: PD cath, L AVF + thrill, bruit not developed well     Labs: BMET Recent Labs  Lab 03/07/17 0555 03/10/17 0727 03/10/17 1203 03/11/17 0730 03/12/17 1429 03/13/17 0516 03/13/17 0951  NA 130*  --  130*  --  136 132*  --   K 3.6  --  2.6*  --  2.3* 3.0*  --   CL 95*  --  93*  --  99* 94*  --   CO2 23  --  26  --  29 27  --   GLUCOSE 383* 447* 328* 607* 141* 624* 750*  BUN 43*  --  49*  --  52* 49*  --   CREATININE 4.71*  --  5.06*  --  5.03* 5.22*  --   CALCIUM 7.5*  --  7.8*  --  8.0* 8.2*  --   PHOS 3.7  --  3.8  --   --   --   --     CBC Recent Labs  Lab 03/10/17 1203 03/12/17 1429 03/13/17 0516  WBC 10.0 13.1* 9.6  HGB 8.0* 7.9* 9.0*  HCT 24.8* 25.2* 28.7*  MCV 93.9 93.7 95.3  PLT 355 416* 457*    @IMGRELPRIORS @ Medications:    . aspirin EC  81 mg Oral Daily  . atorvastatin  80 mg Oral q1800  . calcium acetate  667 mg Oral TID WC  . clopidogrel  75 mg Oral Daily  . docusate sodium  100 mg Oral BID  . gentamicin cream  1 application Topical Daily  . insulin aspart  0-9 Units Subcutaneous TID WC  . insulin NPH Human  55 Units Subcutaneous QAC breakfast  . multivitamin  1 tablet Oral QHS  . polyethylene glycol  17 g Oral Daily  . potassium chloride  30 mEq Oral Daily  . [  START ON 03/30/2017] Vitamin D (Ergocalciferol)  50,000 Units Oral Q30 days     Bufford ButtnerElizabeth Island Dohmen MD [facility] pgr [phone] 03/13/2017, 11:12 AM

## 2017-03-13 NOTE — Progress Notes (Signed)
PD tx initiated via tenckhoff w/o problem, VSS, report given to Sophronia SimasMiranda Surles, RN

## 2017-03-13 NOTE — Progress Notes (Signed)
*  PRELIMINARY RESULTS* Echocardiogram 2D Echocardiogram has been performed.  Joshua SavoyCasey N Searra [name] 03/13/2017, 3:14 PM

## 2017-03-13 NOTE — Discharge Summary (Deleted)
  The note originally documented on this encounter has been moved the the encounter in which it belongs.  

## 2017-03-13 NOTE — Discharge Summary (Signed)
Discharge summary job # [id number]

## 2017-03-13 NOTE — Discharge Summary (Addendum)
Physician Discharge Summary  [name] ZOX:[id number] DOB: 11/30/1940 DOA: 03/12/2017  PCP: Dorice [name], MD  Admit date: 03/12/2017 Discharge date: 03/13/2017  Admitted From: CIR Disposition:  CIR   Recommendations for Outpatient Follow-up:  1. Please order Insulin dosage for him at [facility]   Discharge Condition:  stable  CODE STATUS:  Full code   Consultations:  Cardiology  Nephrology     Discharge Diagnoses:  Principal Problem:   Acute respiratory failure with hypoxia (HCC) Active Problems:   ESRD on peritoneal dialysis (HCC)   Uncontrolled type 2 diabetes mellitus with hyperglycemia, with long-term current use of insulin (HCC)   Chronic obstructive pulmonary disease (HCC)   S/P bilateral BKA (below knee amputation) (HCC)   Coronary artery disease involving native coronary artery of native heart without angina pectoris   Subjective: Feel short of breath when he sits up. No hypoxia or respiratory stress noted when I sit him up.   HPI: [name] is a 76 y.o. male with history of CAD status post CABG, ESRD on peritoneal dialysis, sleep apnea, diabetes mellitus type 2 who has had recent right BKA and also a few days ago left BKA was eventually discharged to rehab at [facility] was found to be acutely confused and also per the patient and patient wife patient was short of breath.  Chest x-ray  done at that time did not show anything acute.  As per the transferring physician's note patient was in new onset A. fib with RVR and was given Cardizem CD.  On my exam patient appears not in distress states his shortness of breath is still present but improved.  Denies any chest pain.  As per the wife patient did not appear confused to her.  Patient is following commands and moves all extremities and oriented to time place and person.  Denies any abdominal pain nausea vomiting.Patient is afebrile.   Hospital Course:   Shortness of breath - today he states he  feels short of breath only when I sit him up. 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%) - I reviewed the EKG and it did not appear to represent A-fib- I therefore consulted Cardiology who agrees with me and feels he has Wandering Atrial Pacemaker/ MAT- rate needs to be controlled preferably with a B blocker- Cardizem stopped- increased Coreg - no need for anticoagulation- d/c Heparin  DM2 - takes NPH 65 U QHS - unfortunately has been receiving a low dose of Lantus   - have given him NPH this AM at 65 u today and have started 55 U in PM - sugars much better controlled  ESRD - peritoneal dialysis  AICD  S/p BKA - cont rehab at [facility]    Discharge Instructions  Discharge Instructions    Increase activity slowly   Complete by:  As directed      Allergies as of 03/13/2017      Reactions   Gabapentin (once-daily) Hypertension   Lisinopril Hives   Methotrexate Derivatives Other (See Comments)   blisters   Sulfa Antibiotics Hives   Tape Hives   Vancomycin Other (See Comments)   Blisters   Zanaflex [tizanidine Hcl]    Hypotensive stroke  Medication List    STOP taking these medications   insulin NPH Human 100 UNIT/ML injection Commonly known as:  HUMULIN N,NOVOLIN N     TAKE these medications   aspirin EC 81 MG tablet Take 81 mg by mouth daily.   atorvastatin 80 MG tablet Commonly known as:  LIPITOR Take 1 tablet (80 mg total) by mouth daily at 6 PM.   calcium acetate 667 MG capsule Commonly known as:  PHOSLO Take 1 capsule (667 mg total) by mouth 3 (three) times daily with meals.   carvedilol 6.25 MG tablet Commonly known as:  COREG Take 1 tablet (6.25 mg total) by mouth at bedtime.    clopidogrel 75 MG tablet Commonly known as:  PLAVIX Take 1 tablet (75 mg total) by mouth daily.   cyclobenzaprine 10 MG tablet Commonly known as:  FLEXERIL Take 1 tablet (10 mg total) 2 (two) times daily as needed by mouth for muscle spasms.   docusate sodium 100 MG capsule Commonly known as:  COLACE Take 100 mg by mouth 2 (two) times daily.   multivitamin Tabs tablet Take 1 tablet by mouth at bedtime.   nitroGLYCERIN 0.4 MG SL tablet Commonly known as:  NITROSTAT Place 0.4 mg under the tongue every 5 (five) minutes as needed for chest pain.   oxyCODONE 5 MG immediate release tablet Commonly known as:  Oxy IR/ROXICODONE Take 0.5 tablets (2.5 mg total) by mouth every 6 (six) hours as needed for severe pain.   polyethylene glycol packet Commonly known as:  MIRALAX / GLYCOLAX Take 17 g by mouth daily.   RELION INSULIN SYRINGE 31G X 15/64" 1 ML Misc Generic drug:  Insulin Syringe-Needle U-100   silver sulfADIAZINE 1 % cream Commonly known as:  SILVADENE Apply 1 application daily topically.   Vitamin D (Ergocalciferol) 50000 units Caps capsule Commonly known as:  DRISDOL Take 50,000 Units by mouth every 30 (thirty) days.       Allergies  Allergen Reactions  . Gabapentin (Once-Daily) Hypertension  . Lisinopril Hives  . Methotrexate Derivatives Other (See Comments)    blisters  . Sulfa Antibiotics Hives  . Tape Hives  . Vancomycin Other (See Comments)    Blisters   . Zanaflex [Tizanidine Hcl]     Hypotensive stroke     Procedures/Studies:    Dg Chest Port 1 View  Result Date: 03/12/2017 CLINICAL DATA:  Shortness of breath. EXAM: PORTABLE CHEST 1 VIEW COMPARISON:  03/05/2017 FINDINGS: 1328 hours. Low lung volumes. The cardio pericardial silhouette is enlarged. There is pulmonary vascular congestion without overt pulmonary edema. Left defibrillator again noted. Patient is status post CABG. The visualized bony structures of the thorax are intact. IMPRESSION:  Cardiomegaly with vascular congestion. Electronically Signed   By: Kennith CenterEric  Mansell M.D.   On: 03/12/2017 13:58   Dg Chest Port 1 View  Result Date: 03/05/2017 CLINICAL DATA:  Sepsis EXAM: PORTABLE CHEST 1 VIEW COMPARISON:  01/12/2017 FINDINGS: Postoperative changes in the mediastinum. Cardiac pacemaker with lead tip along the left chest, unchanged in position. Cardiac enlargement. No vascular congestion or edema. No blunting of costophrenic angles. No pneumothorax. No focal consolidation. Calcification of the aorta. IMPRESSION: Cardiac enlargement.  No evidence of active pulmonary disease. Electronically Signed   By: Burman NievesWilliam  Stevens M.D.   On: 03/05/2017 01:16       Discharge Exam: Vitals:   03/13/17 1017 03/13/17 1216  BP: (!) 122/48 (!) 145/55  Pulse: 82 89  Resp: 18 18  Temp: 98 F (36.7  C) 97.6 F (36.4 C)  SpO2:  98%   Vitals:   03/13/17 0118 03/13/17 0618 03/13/17 1017 03/13/17 1216  BP: (!) 93/46 (!) 129/53 (!) 122/48 (!) 145/55  Pulse: 78 86 82 89  Resp:  16 18 18   Temp:  97.9 F (36.6 C) 98 F (36.7 C) 97.6 F (36.4 C)  TempSrc:  Oral Oral Oral  SpO2: 93% 97%  98%  Weight:   68 kg (149 lb 14.6 oz)   Height:        General: Pt is alert, awake, not in acute distress Cardiovascular: IIRR, S1/S2 +, no rubs, no gallops Respiratory: CTA bilaterally, coarse crackles at bases Abdominal: Soft, NT, ND, bowel sounds + Extremities: no edema, no cyanosis    The results of significant diagnostics from this hospitalization (including imaging, microbiology, ancillary and laboratory) are listed below for reference.     Microbiology: Recent Results (from the past 240 hour(s))  Culture, blood (routine x 2)     Status: None   Collection Time: 03/04/17 10:50 PM  Result Value Ref Range Status   Specimen Description BLOOD RIGHT ANTECUBITAL  Final   Special Requests   Final    BOTTLES DRAWN AEROBIC ONLY Blood Culture adequate volume   Culture NO GROWTH 5 DAYS  Final   Report  Status 03/09/2017 FINAL  Final  Culture, blood (routine x 2)     Status: None   Collection Time: 03/04/17 11:06 PM  Result Value Ref Range Status   Specimen Description BLOOD RIGHT HAND  Final   Special Requests   Final    BOTTLES DRAWN AEROBIC ONLY Blood Culture adequate volume   Culture NO GROWTH 5 DAYS  Final   Report Status 03/09/2017 FINAL  Final  Culture, blood (routine x 2)     Status: None (Preliminary result)   Collection Time: 03/12/17  8:10 PM  Result Value Ref Range Status   Specimen Description BLOOD RIGHT HAND  Final   Special Requests IN PEDIATRIC BOTTLE Blood Culture adequate volume  Final   Culture NO GROWTH < 12 HOURS  Final   Report Status PENDING  Incomplete  Culture, blood (routine x 2)     Status: None (Preliminary result)   Collection Time: 03/12/17  8:20 PM  Result Value Ref Range Status   Specimen Description BLOOD RIGHT THUMB  Final   Special Requests IN PEDIATRIC BOTTLE Blood Culture adequate volume  Final   Culture NO GROWTH < 12 HOURS  Final   Report Status PENDING  Incomplete     Labs: BNP (last 3 results) No results for input(s): BNP in the last 8760 hours. Basic Metabolic Panel: Recent Labs  Lab 03/07/17 0555  03/10/17 1203 03/11/17 0730 03/12/17 1429 03/13/17 0516 03/13/17 0951  NA 130*  --  130*  --  136 132*  --   K 3.6  --  2.6*  --  2.3* 3.0*  --   CL 95*  --  93*  --  99* 94*  --   CO2 23  --  26  --  29 27  --   GLUCOSE 383*   < > 328* 607* 141* 624* 750*  BUN 43*  --  49*  --  52* 49*  --   CREATININE 4.71*  --  5.06*  --  5.03* 5.22*  --   CALCIUM 7.5*  --  7.8*  --  8.0* 8.2*  --   MG  --   --   --   --   --  1.7  --   PHOS 3.7  --  3.8  --   --   --   --    < > = values in this interval not displayed.   Liver Function Tests: Recent Labs  Lab 03/07/17 0555 03/10/17 1203 03/12/17 1429  AST  --   --  49*  ALT  --   --  42  ALKPHOS  --   --  87  BILITOT  --   --  0.5  PROT  --   --  5.3*  ALBUMIN 1.3* 1.4* 1.3*   No  results for input(s): LIPASE, AMYLASE in the last 168 hours. Recent Labs  Lab 03/12/17 2008  AMMONIA 30   CBC: Recent Labs  Lab 03/10/17 1203 03/12/17 1429 03/13/17 0516  WBC 10.0 13.1* 9.6  HGB 8.0* 7.9* 9.0*  HCT 24.8* 25.2* 28.7*  MCV 93.9 93.7 95.3  PLT 355 416* 457*   Cardiac Enzymes: Recent Labs  Lab 03/12/17 2008 03/12/17 2333 03/13/17 0516 03/13/17 0955  TROPONINI 0.52* 0.45* 0.41* 0.31*   BNP: Invalid input(s): POCBNP CBG: Recent Labs  Lab 03/13/17 0731 03/13/17 0734 03/13/17 1146 03/13/17 1149 03/13/17 1356  GLUCAP >600* >600* >600* 592* 480*   D-Dimer Recent Labs    03/12/17 2008  DDIMER 1.27*   Hgb A1c No results for input(s): HGBA1C in the last 72 hours. Lipid Profile No results for input(s): CHOL, HDL, LDLCALC, TRIG, CHOLHDL, LDLDIRECT in the last 72 hours. Thyroid function studies Recent Labs    03/12/17 2008  TSH 4.190   Anemia work up No results for input(s): VITAMINB12, FOLATE, FERRITIN, TIBC, IRON, RETICCTPCT in the last 72 hours. Urinalysis    Component Value Date/Time   COLORURINE YELLOW 03/13/2017 1251   APPEARANCEUR CLOUDY (A) 03/13/2017 1251   LABSPEC 1.016 03/13/2017 1251   PHURINE 6.0 03/13/2017 1251   GLUCOSEU >=500 (A) 03/13/2017 1251   HGBUR MODERATE (A) 03/13/2017 1251   BILIRUBINUR NEGATIVE 03/13/2017 1251   KETONESUR NEGATIVE 03/13/2017 1251   PROTEINUR 100 (A) 03/13/2017 1251   NITRITE NEGATIVE 03/13/2017 1251   LEUKOCYTESUR LARGE (A) 03/13/2017 1251   Sepsis Labs Invalid input(s): PROCALCITONIN,  WBC,  LACTICIDVEN Microbiology Recent Results (from the past 240 hour(s))  Culture, blood (routine x 2)     Status: None   Collection Time: 03/04/17 10:50 PM  Result Value Ref Range Status   Specimen Description BLOOD RIGHT ANTECUBITAL  Final   Special Requests   Final    BOTTLES DRAWN AEROBIC ONLY Blood Culture adequate volume   Culture NO GROWTH 5 DAYS  Final   Report Status 03/09/2017 FINAL  Final   Culture, blood (routine x 2)     Status: None   Collection Time: 03/04/17 11:06 PM  Result Value Ref Range Status   Specimen Description BLOOD RIGHT HAND  Final   Special Requests   Final    BOTTLES DRAWN AEROBIC ONLY Blood Culture adequate volume   Culture NO GROWTH 5 DAYS  Final   Report Status 03/09/2017 FINAL  Final  Culture, blood (routine x 2)     Status: None (Preliminary result)   Collection Time: 03/12/17  8:10 PM  Result Value Ref Range Status   Specimen Description BLOOD RIGHT HAND  Final   Special Requests IN PEDIATRIC BOTTLE Blood Culture adequate volume  Final   Culture NO GROWTH < 12 HOURS  Final   Report Status PENDING  Incomplete  Culture, blood (routine x 2)  Status: None (Preliminary result)   Collection Time: 03/12/17  8:20 PM  Result Value Ref Range Status   Specimen Description BLOOD RIGHT THUMB  Final   Special Requests IN PEDIATRIC BOTTLE Blood Culture adequate volume  Final   Culture NO GROWTH < 12 HOURS  Final   Report Status PENDING  Incomplete     Time coordinating discharge: Over 30 minutes  SIGNED:   Calvert CantorSaima Lanyiah Brix, MD  [facility] 03/13/2017, 2:50 PM Pager   If 7PM-7AM, please contact night-coverage [email or web address] Password [email or web address]

## 2017-03-13 NOTE — Progress Notes (Signed)
Patient ID: [name] KitchenJames D Fryberger, male   DOB: Mar 19, 1941, 76 y.o.   MRN: [id number] Patient is status post revision left below the knee amputation with a primary right transtibial amputation.  Examination of both wounds the left leg incision is healing quite nicely there is no cellulitis no dehiscence.  Semination of the right transtibial amputation patient does have some superficial ischemic changes on the posterior flap.  We will continue dressing changes on the right will place an order for a stump shrinker for the left.

## 2017-03-13 NOTE — Care Management Obs Status (Signed)
MEDICARE OBSERVATION STATUS NOTIFICATION   Patient Details  Name: Joshua [name] MRN: [id number] Date of Birth: Jan 21, 1941   Medicare Observation Status Notification Given:  Yes    Cherrie DistanceChandler, Esra Frankowski L, RN 03/13/2017, 3:30 PM

## 2017-03-13 NOTE — Care Management CC44 (Signed)
Condition Code 44 Documentation Completed  Patient Details  Name: Joshua [name] MRN: [id number] Date of Birth: 1940/08/10   Condition Code 44 given:  Yes Patient signature on Condition Code 44 notice:  Yes Documentation of 2 MD's agreement:  Yes Code 44 added to claim:  Yes    Cherrie Distancehandler, Chundra Sauerwein L, RN 03/13/2017, 3:30 PM

## 2017-03-13 NOTE — Progress Notes (Signed)
Lactic Acid 2.2.  [facility]   No new orders at this time. Will continue to monitor.  [name] Lauver, RN

## 2017-03-13 NOTE — Progress Notes (Signed)
Rehab admissions - Patient well known to me from previous inpatient rehab admissions.  Noted CBGs elevated today.  I have talked with rehab MD and attending MD.  Rehab MD says that if patient remains stable over night, then we can re admit to inpatient rehab in am.  Call me for questions.  [phone]

## 2017-03-14 ENCOUNTER — Inpatient Hospital Stay (HOSPITAL_COMMUNITY): Payer: Medicare Other

## 2017-03-14 ENCOUNTER — Inpatient Hospital Stay (HOSPITAL_REHABILITATION)
Admission: RE | Admit: 2017-03-14 | Discharge: 2017-03-28 | Disposition: A | Discharge status: Home Health | Payer: Medicare Other | Source: Intra-hospital | Attending: Physical Medicine & Rehabilitation | Admitting: Physical Medicine & Rehabilitation

## 2017-03-14 ENCOUNTER — Inpatient Hospital Stay (HOSPITAL_COMMUNITY): Payer: Medicare Other | Admitting: Occupational Therapy

## 2017-03-14 DIAGNOSIS — Z951 Presence of aortocoronary bypass graft: Secondary | ICD-10-CM

## 2017-03-14 DIAGNOSIS — K219 Gastro-esophageal reflux disease without esophagitis: Secondary | ICD-10-CM | POA: Diagnosis present

## 2017-03-14 DIAGNOSIS — Z882 Allergy status to sulfonamides status: Secondary | ICD-10-CM

## 2017-03-14 DIAGNOSIS — E876 Hypokalemia: Secondary | ICD-10-CM | POA: Diagnosis present

## 2017-03-14 DIAGNOSIS — I2510 Atherosclerotic heart disease of native coronary artery without angina pectoris: Secondary | ICD-10-CM

## 2017-03-14 DIAGNOSIS — Z992 Dependence on renal dialysis: Secondary | ICD-10-CM

## 2017-03-14 DIAGNOSIS — I132 Hypertensive heart and chronic kidney disease with heart failure and with stage 5 chronic kidney disease, or end stage renal disease: Secondary | ICD-10-CM

## 2017-03-14 DIAGNOSIS — N186 End stage renal disease: Secondary | ICD-10-CM | POA: Diagnosis present

## 2017-03-14 DIAGNOSIS — N2581 Secondary hyperparathyroidism of renal origin: Secondary | ICD-10-CM

## 2017-03-14 DIAGNOSIS — E785 Hyperlipidemia, unspecified: Secondary | ICD-10-CM

## 2017-03-14 DIAGNOSIS — M791 Myalgia, unspecified site: Secondary | ICD-10-CM

## 2017-03-14 DIAGNOSIS — E46 Unspecified protein-calorie malnutrition: Secondary | ICD-10-CM

## 2017-03-14 DIAGNOSIS — E1142 Type 2 diabetes mellitus with diabetic polyneuropathy: Secondary | ICD-10-CM | POA: Diagnosis present

## 2017-03-14 DIAGNOSIS — D631 Anemia in chronic kidney disease: Secondary | ICD-10-CM | POA: Diagnosis present

## 2017-03-14 DIAGNOSIS — E11649 Type 2 diabetes mellitus with hypoglycemia without coma: Secondary | ICD-10-CM | POA: Diagnosis not present

## 2017-03-14 DIAGNOSIS — Z7902 Long term (current) use of antithrombotics/antiplatelets: Secondary | ICD-10-CM

## 2017-03-14 DIAGNOSIS — I5032 Chronic diastolic (congestive) heart failure: Secondary | ICD-10-CM | POA: Diagnosis present

## 2017-03-14 DIAGNOSIS — I429 Cardiomyopathy, unspecified: Secondary | ICD-10-CM | POA: Diagnosis present

## 2017-03-14 DIAGNOSIS — K5900 Constipation, unspecified: Secondary | ICD-10-CM | POA: Diagnosis present

## 2017-03-14 DIAGNOSIS — E1122 Type 2 diabetes mellitus with diabetic chronic kidney disease: Secondary | ICD-10-CM

## 2017-03-14 DIAGNOSIS — Z794 Long term (current) use of insulin: Secondary | ICD-10-CM

## 2017-03-14 DIAGNOSIS — Z95810 Presence of automatic (implantable) cardiac defibrillator: Secondary | ICD-10-CM

## 2017-03-14 DIAGNOSIS — Z888 Allergy status to other drugs, medicaments and biological substances status: Secondary | ICD-10-CM

## 2017-03-14 DIAGNOSIS — Z8673 Personal history of transient ischemic attack (TIA), and cerebral infarction without residual deficits: Secondary | ICD-10-CM

## 2017-03-14 DIAGNOSIS — E1165 Type 2 diabetes mellitus with hyperglycemia: Secondary | ICD-10-CM

## 2017-03-14 DIAGNOSIS — G4730 Sleep apnea, unspecified: Secondary | ICD-10-CM

## 2017-03-14 DIAGNOSIS — Z6824 Body mass index (BMI) 24.0-24.9, adult: Secondary | ICD-10-CM

## 2017-03-14 DIAGNOSIS — Z833 Family history of diabetes mellitus: Secondary | ICD-10-CM

## 2017-03-14 DIAGNOSIS — Z87891 Personal history of nicotine dependence: Secondary | ICD-10-CM

## 2017-03-14 DIAGNOSIS — I471 Supraventricular tachycardia: Secondary | ICD-10-CM | POA: Diagnosis present

## 2017-03-14 DIAGNOSIS — Z89511 Acquired absence of right leg below knee: Secondary | ICD-10-CM

## 2017-03-14 DIAGNOSIS — I498 Other specified cardiac arrhythmias: Secondary | ICD-10-CM | POA: Diagnosis present

## 2017-03-14 DIAGNOSIS — Z79899 Other long term (current) drug therapy: Secondary | ICD-10-CM

## 2017-03-14 DIAGNOSIS — J449 Chronic obstructive pulmonary disease, unspecified: Secondary | ICD-10-CM | POA: Diagnosis present

## 2017-03-14 DIAGNOSIS — Z89512 Acquired absence of left leg below knee: Secondary | ICD-10-CM

## 2017-03-14 DIAGNOSIS — R531 Weakness: Secondary | ICD-10-CM

## 2017-03-14 DIAGNOSIS — Z4781 Encounter for orthopedic aftercare following surgical amputation: Principal | ICD-10-CM

## 2017-03-14 DIAGNOSIS — Z6822 Body mass index (BMI) 22.0-22.9, adult: Secondary | ICD-10-CM

## 2017-03-14 DIAGNOSIS — R000 Tachycardia, unspecified: Secondary | ICD-10-CM

## 2017-03-14 DIAGNOSIS — I4891 Unspecified atrial fibrillation: Secondary | ICD-10-CM

## 2017-03-14 DIAGNOSIS — M3500 Sicca syndrome, unspecified: Secondary | ICD-10-CM | POA: Diagnosis present

## 2017-03-14 DIAGNOSIS — Z881 Allergy status to other antibiotic agents status: Secondary | ICD-10-CM

## 2017-03-14 DIAGNOSIS — I248 Other forms of acute ischemic heart disease: Secondary | ICD-10-CM | POA: Diagnosis present

## 2017-03-14 DIAGNOSIS — E43 Unspecified severe protein-calorie malnutrition: Secondary | ICD-10-CM | POA: Diagnosis present

## 2017-03-14 DIAGNOSIS — Z7982 Long term (current) use of aspirin: Secondary | ICD-10-CM

## 2017-03-14 DIAGNOSIS — J9601 Acute respiratory failure with hypoxia: Secondary | ICD-10-CM | POA: Diagnosis not present

## 2017-03-14 LAB — BASIC METABOLIC PANEL
ANION GAP: 14 (ref 5–15)
BUN: 49 mg/dL — ABNORMAL HIGH (ref 6–20)
CO2: 25 mmol/L (ref 22–32)
Calcium: 8.7 mg/dL — ABNORMAL LOW (ref 8.9–10.3)
Chloride: 96 mmol/L — ABNORMAL LOW (ref 101–111)
Creatinine, Ser: 5.37 mg/dL — ABNORMAL HIGH (ref 0.61–1.24)
GFR, EST AFRICAN AMERICAN: 11 mL/min — AB (ref >60)
GFR, EST NON AFRICAN AMERICAN: 9 mL/min — AB (ref >60)
GLUCOSE: 328 mg/dL — AB (ref 65–99)
POTASSIUM: 4.2 mmol/L (ref 3.5–5.1)
Sodium: 135 mmol/L (ref 135–145)

## 2017-03-14 LAB — CBC
HEMATOCRIT: 31.1 % — AB (ref 39.0–52.0)
HEMOGLOBIN: 9.7 g/dL — AB (ref 13.0–17.0)
MCH: 29.5 pg (ref 26.0–34.0)
MCHC: 31.2 g/dL (ref 30.0–36.0)
MCV: 94.5 fL (ref 78.0–100.0)
Platelets: 462 10*3/uL — ABNORMAL HIGH (ref 150–400)
RBC: 3.29 MIL/uL — AB (ref 4.22–5.81)
RDW: 16.1 % — ABNORMAL HIGH (ref 11.5–15.5)
WBC: 10.2 10*3/uL (ref 4.0–10.5)

## 2017-03-14 LAB — GLUCOSE, CAPILLARY
GLUCOSE-CAPILLARY: 354 mg/dL — AB (ref 65–99)
GLUCOSE-CAPILLARY: 124 mg/dL — AB (ref 65–99)
Glucose-Capillary: >600 mg/dL (ref 65–99)
Glucose-Capillary: >600 mg/dL (ref 65–99)
Glucose-Capillary: 283 mg/dL — ABNORMAL HIGH (ref 65–99)

## 2017-03-14 LAB — URINE CULTURE
Culture: NO GROWTH
Culture: NO GROWTH

## 2017-03-14 MED ORDER — OXYCODONE HCL 5 MG PO TABS
2.5000 mg | ORAL_TABLET | ORAL | Status: AC
  Filled 2017-03-14 (×2): qty 1

## 2017-03-14 MED ORDER — DOCUSATE SODIUM 100 MG PO CAPS
100.0000 mg | ORAL_CAPSULE | Freq: Two times a day (BID) | ORAL | Status: DC
  Administered 2017-03-14 – 2017-03-27 (×21): 100 mg via ORAL
  Filled 2017-03-14 (×25): qty 1

## 2017-03-14 MED ORDER — NITROGLYCERIN 0.4 MG SL SUBL: 0.4000 mg | SUBLINGUAL_TABLET | SUBLINGUAL | Status: DC | PRN

## 2017-03-14 MED ORDER — OXYCODONE HCL 5 MG PO TABS
5.0000 mg | ORAL_TABLET | Freq: Four times a day (QID) | ORAL | Status: DC | PRN
  Administered 2017-03-15 – 2017-03-26 (×11): 5 mg via ORAL
  Filled 2017-03-14 (×12): qty 1

## 2017-03-14 MED ORDER — INSULIN NPH (HUMAN) (ISOPHANE) 100 UNIT/ML ~~LOC~~ SUSP
65.0000 [IU] | Freq: Every day | SUBCUTANEOUS | Status: DC
  Administered 2017-03-14: 65 [IU] via SUBCUTANEOUS
  Filled 2017-03-14: qty 10

## 2017-03-14 MED ORDER — SORBITOL 70 % SOLN: 30.0000 mL | Freq: Every day | Status: DC | PRN

## 2017-03-14 MED ORDER — ASPIRIN EC 81 MG PO TBEC
81.0000 mg | DELAYED_RELEASE_TABLET | Freq: Every day | ORAL | Status: DC
  Administered 2017-03-15 – 2017-03-28 (×14): 81 mg via ORAL
  Filled 2017-03-14 (×14): qty 1

## 2017-03-14 MED ORDER — HEPARIN 1000 UNIT/ML FOR PERITONEAL DIALYSIS
500.0000 [IU] | INTRAMUSCULAR | Status: DC | PRN
  Filled 2017-03-14: qty 0.5

## 2017-03-14 MED ORDER — CARVEDILOL 12.5 MG PO TABS
12.5000 mg | ORAL_TABLET | Freq: Two times a day (BID) | ORAL | Status: DC
  Administered 2017-03-14 – 2017-03-20 (×7): 12.5 mg via ORAL
  Filled 2017-03-14 (×10): qty 1

## 2017-03-14 MED ORDER — POTASSIUM CHLORIDE CRYS ER 10 MEQ PO TBCR
30.0000 meq | EXTENDED_RELEASE_TABLET | Freq: Every day | ORAL | Status: AC
  Administered 2017-03-15 – 2017-03-17 (×3): 30 meq via ORAL
  Filled 2017-03-14 (×3): qty 1

## 2017-03-14 MED ORDER — DELFLEX-LC/4.25% DEXTROSE 483 MOSM/L IP SOLN
INTRAPERITONEAL | Status: DC
  Administered 2017-03-14: 19:00:00 via INTRAPERITONEAL

## 2017-03-14 MED ORDER — INSULIN NPH (HUMAN) (ISOPHANE) 100 UNIT/ML ~~LOC~~ SUSP
50.0000 [IU] | Freq: Every day | SUBCUTANEOUS | Status: DC
  Administered 2017-03-14 – 2017-03-15 (×2): 50 [IU] via SUBCUTANEOUS

## 2017-03-14 MED ORDER — CALCIUM ACETATE (PHOS BINDER) 667 MG PO CAPS
667.0000 mg | ORAL_CAPSULE | Freq: Three times a day (TID) | ORAL | Status: DC
  Administered 2017-03-14 – 2017-03-28 (×33): 667 mg via ORAL
  Filled 2017-03-14 (×39): qty 1

## 2017-03-14 MED ORDER — DELFLEX-LC/2.5% DEXTROSE 394 MOSM/L IP SOLN
INTRAPERITONEAL | Status: DC
  Administered 2017-03-14 – 2017-03-15 (×2): via INTRAPERITONEAL

## 2017-03-14 MED ORDER — PRO-STAT SUGAR FREE PO LIQD
30.0000 mL | Freq: Two times a day (BID) | ORAL | Status: DC
  Administered 2017-03-14: 30 mL via ORAL
  Filled 2017-03-14: qty 30

## 2017-03-14 MED ORDER — ACETAMINOPHEN 650 MG RE SUPP: 650.0000 mg | Freq: Four times a day (QID) | RECTAL | Status: DC | PRN

## 2017-03-14 MED ORDER — VITAMIN D (ERGOCALCIFEROL) 1.25 MG (50000 UNIT) PO CAPS: 50000.0000 [IU] | ORAL_CAPSULE | ORAL | Status: DC

## 2017-03-14 MED ORDER — CLOPIDOGREL BISULFATE 75 MG PO TABS
75.0000 mg | ORAL_TABLET | Freq: Every day | ORAL | Status: DC
  Administered 2017-03-15 – 2017-03-28 (×14): 75 mg via ORAL
  Filled 2017-03-14 (×14): qty 1

## 2017-03-14 MED ORDER — GENTAMICIN SULFATE 0.1 % EX CREA
1.0000 "application " | TOPICAL_CREAM | Freq: Every day | CUTANEOUS | Status: DC
  Administered 2017-03-14 – 2017-03-19 (×6): 1 via TOPICAL
  Filled 2017-03-14: qty 15

## 2017-03-14 MED ORDER — POLYETHYLENE GLYCOL 3350 17 G PO PACK
17.0000 g | PACK | Freq: Every day | ORAL | Status: DC
Start: 2017-03-15 — End: 2017-03-21
  Administered 2017-03-15 – 2017-03-19 (×2): 17 g via ORAL
  Filled 2017-03-14 (×4): qty 1

## 2017-03-14 MED ORDER — ACETAMINOPHEN 325 MG PO TABS
650.0000 mg | ORAL_TABLET | Freq: Four times a day (QID) | ORAL | Status: DC | PRN
  Administered 2017-03-15 – 2017-03-27 (×14): 650 mg via ORAL
  Filled 2017-03-14 (×14): qty 2

## 2017-03-14 MED ORDER — CYCLOBENZAPRINE HCL 10 MG PO TABS
10.0000 mg | ORAL_TABLET | Freq: Two times a day (BID) | ORAL | Status: DC | PRN
  Administered 2017-03-14 – 2017-03-17 (×2): 10 mg via ORAL
  Filled 2017-03-14 (×2): qty 1

## 2017-03-14 MED ORDER — INSULIN NPH (HUMAN) (ISOPHANE) 100 UNIT/ML ~~LOC~~ SUSP
65.0000 [IU] | Freq: Every day | SUBCUTANEOUS | Status: DC
  Administered 2017-03-15 – 2017-03-16 (×2): 65 [IU] via SUBCUTANEOUS
  Filled 2017-03-14: qty 10

## 2017-03-14 MED ORDER — RENA-VITE PO TABS
1.0000 | ORAL_TABLET | Freq: Every day | ORAL | Status: DC
  Administered 2017-03-14 – 2017-03-18 (×5): 1 via ORAL
  Filled 2017-03-14 (×5): qty 1

## 2017-03-14 MED ORDER — INSULIN ASPART 100 UNIT/ML ~~LOC~~ SOLN
0.0000 [IU] | Freq: Three times a day (TID) | SUBCUTANEOUS | Status: DC
  Administered 2017-03-14: 2 [IU] via SUBCUTANEOUS
  Administered 2017-03-15: 15 [IU] via SUBCUTANEOUS
  Administered 2017-03-16: 2 [IU] via SUBCUTANEOUS

## 2017-03-14 MED ORDER — ATORVASTATIN CALCIUM 80 MG PO TABS
80.0000 mg | ORAL_TABLET | Freq: Every day | ORAL | Status: DC
  Administered 2017-03-14 – 2017-03-26 (×13): 80 mg via ORAL
  Filled 2017-03-14 (×14): qty 1

## 2017-03-14 MED ORDER — OXYCODONE HCL 5 MG PO TABS
2.5000 mg | ORAL_TABLET | Freq: Four times a day (QID) | ORAL | Status: DC | PRN
  Administered 2017-03-14: 2.5 mg via ORAL
  Filled 2017-03-14: qty 1

## 2017-03-14 NOTE — Plan of Care (Signed)
  Pain Managment: General experience of comfort will improve 03/14/2017 0214 - Adequate for Discharge by Jeanella Flatteryhomas, Kamiryn Bezanson T, RN

## 2017-03-14 NOTE — Discharge Summary (Deleted)
  The note originally documented on this encounter has been moved the the encounter in which it belongs.  

## 2017-03-14 NOTE — Progress Notes (Signed)
[facility] Progress Note   Assessment/ Plan:   1. SOB - likely due to vol overload, improved after 4.25% fills; will use another mix of 2.5 and 4.25% tonight with a plan to likely go back to all 2.5% thereafter.     2. DM2- glucoses quite elevated, insulin increased per primary with improvement. 3. Afib with RVR - per primary.  4. PAD sp revision L BKA and new R BKA on 11/21 5. [place].  6. Hypokalemia - Closely following K, on supplementation. 7. HTN/ vol - coreg 6.25mg  hs. 8. MBD of CKD - holding phoslo for lowish/ controlled  Phos = 3.6/3.7 ;  Correc Ca= 8.9  No vit D 9. UTI - blood cx's neg, no urine cx in system. Sp short course abx. 10. Malnutrition - alb 1.3   Prostat, +Nepro.  11. Anemia of CKD - getting darbe 150/wk here, Hb 7-9 range     Subjective:    Tolerating dialysis well.    UF seems to have improved his resp status.  Back to CIR today.    Objective:   BP (!) 109/53 (BP Location: Right Arm)   Pulse 80   Temp (!) 97.3 F (36.3 C) (Oral)   Resp 20   Ht 5\' 9"  (1.753 m)   Wt 74 kg (163 lb 2.3 oz)   SpO2 94%   BMI 24.09 kg/m   Physical Exam: General:  Alert ,NAD, OX3 appropriate  Heart: RRR; No m, r, g Lungs: CTA bilat anteriorly, diminished at the bases. Abdomen: soft, non-tender., non distended, PD cath in place. Extremities: L BKA and new R BKA.  Edema getting better, still has some sacral dependent edema.   Dialysis Access: PD cath, L AVF + thrill, bruit not developed well     Labs: BMET Recent Labs  Lab 03/10/17 0727 03/10/17 1203 03/11/17 0730 03/12/17 1429 03/13/17 0516 03/13/17 0951 03/14/17 0603  NA  --  130*  --  136 132*  --  135  K  --  2.6*  --  2.3* 3.0*  --  4.2  CL  --  93*  --  99* 94*  --  96*  CO2  --  26  --  29 27  --  25  GLUCOSE 447* 328* 607* 141* 624* 750* 328*  BUN  --  49*  --  52* 49*  --  49*  CREATININE  --  5.06*  --  5.03* 5.22*  --  5.37*  CALCIUM  --  7.8*  --  8.0*  8.2*  --  8.7*  PHOS  --  3.8  --   --   --   --   --    CBC Recent Labs  Lab 03/10/17 1203 03/12/17 1429 03/13/17 0516 03/14/17 0641  WBC 10.0 13.1* 9.6 10.2  HGB 8.0* 7.9* 9.0* 9.7*  HCT 24.8* 25.2* 28.7* 31.1*  MCV 93.9 93.7 95.3 94.5  PLT 355 416* 457* 462*    @IMGRELPRIORS @ Medications:    . aspirin EC  81 mg Oral Daily  . atorvastatin  80 mg Oral q1800  . calcium acetate  667 mg Oral TID WC  . carvedilol  12.5 mg Oral BID WC  . clopidogrel  75 mg Oral Daily  . docusate sodium  100 mg Oral BID  . gentamicin cream  1 application Topical Daily  . insulin aspart  0-15 Units Subcutaneous TID WC  . insulin aspart  0-5 Units Subcutaneous QHS  .  insulin NPH Human  50 Units Subcutaneous QHS  . insulin NPH Human  65 Units Subcutaneous QAC breakfast  . multivitamin  1 tablet Oral QHS  . polyethylene glycol  17 g Oral Daily  . potassium chloride  30 mEq Oral Daily  . [START ON 03/30/2017] Vitamin D (Ergocalciferol)  50,000 Units Oral Q30 days     Bufford ButtnerElizabeth Tripp Goins MD [facility] pgr [phone] 03/14/2017, 11:23 AM

## 2017-03-14 NOTE — Progress Notes (Signed)
PD tx initiated via tenckhoff w/o problem, VSS, report givne to Christiana FuchsHillary Lilja, RN

## 2017-03-14 NOTE — Discharge Summary (Signed)
[name]:  [name], [name]                 ACCOUNT NO.:  [id number]  MEDICAL RECORD NO.:  [id number]  LOCATION:                                 FACILITY:  PHYSICIAN:  Maryla MorrowAnkit Patel, MD        DATE OF BIRTH:  [birth year]  DATE OF ADMISSION:  03/06/2017 DATE OF DISCHARGE:  03/12/2017                              DISCHARGE SUMMARY   DISCHARGE DIAGNOSES: 1. Right below knee amputation and revision of left below knee     amputation on March 08, 2017. 2. Pain management. 3. New onset atrial fibrillation. 4. End-stage renal disease with peritoneal dialysis. 5. Diabetes mellitus. 6. Coronary artery disease with coronary artery bypass grafting. 7. Diastolic congestive heart failure. 8. Systemic inflammatory response syndrome. 9. Hypertension, hyperlipidemia, and constipation.  This is a 76 year old right-handed male, history of diabetes mellitus, peripheral neuropathy, congestive heart failure, COPD, CABG in 2011 maintained, on aspirin and Plavix, end-stage renal disease with peritoneal dialysis as well as recent left BKA on December 23, 2016, and 5th toe amputation on right foot.  Receiving inpatient rehab services, January 17, 2017, until February 07, 2017.  He lives with his wife.  Presented on March 01, 2017, with progressive ischemic changes of the right foot.  Gangrenous changes along the left previous BKA. Underwent right BKA and revision of left BKA per [name].  Wound VAC applied.  Peritoneal dialysis ongoing.  Mildly elevated troponin at 0.17, felt to be related to demand ischemia.  Acute on chronic anemia and monitored.  Bouts of hypokalemia with supplement added per Renal Services, peritoneal dialysis.  Low-grade fever, maintained on Zosyn, blood cultures negative.  The patient was admitted for comprehensive rehab program.  PAST MEDICAL HISTORY:  See discharge diagnoses.  SOCIAL HISTORY:  Lives with wife.  FUNCTIONAL STATUS UPON ADMISSION TO REHAB SERVICES:  +2 physical  assist anterior-posterior transfers, moderate assist supine to sit.  PHYSICAL EXAMINATION:  VITAL SIGNS:  Blood pressure 121/49, pulse 87, temperature 98, and respirations 18. GENERAL:  Alert male, oriented to person, place, and time. HEENT:  He had an ulcer on the bridge of his nose.  EOMs intact. NECK:  Supple.  Nontender.  No JVD. CARDIAC:  Rate controlled. ABDOMEN:  Soft, nontender.  Good bowel sounds. LUNGS:  Clear to auscultation without wheeze. EXTREMITIES:  BKA site dressed, appropriately tender.  REHABILITATION HOSPITAL COURSE:  The patient was admitted to Inpatient Rehab Services with therapies initiated on a 3-hour daily basis, consisting of physical therapy, occupational therapy, and rehabilitation nursing.  The following issues were addressed during the patient's rehabilitation stay.  Pertaining to [name]'s right BKA with revision of left BKA, surgical site dressed.  Wound VAC had been discontinued.  Follow up per [name].  The patient on heparin per Nephrology Services for chronic peritoneal dialysis, bouts of hypokalemia with supplement per Renal Services.  Brittle diabetes mellitus, hemoglobin A1c 8.8, insulin therapy adjusted.  Acute on chronic anemia, hemoglobin 8.1 with iron supplement.  History of SIRS, blood cultures no growth, afebrile, antibiotics discontinued on March 07, 2017.  Blood pressures controlled and monitored, on Coreg.  Noted mild leukocytosis at 12,500, no apparent source, afebrile.  The patient was attending therapies.  On the day of March 12, 2017, the patient with findings of new onset atrial fibrillation, was given Cardizem CD. Mildly appearing increasing shortness of breath.  Oxygen saturations acceptable.  Blood cultures were elevated.  Insulin adjusted.  Noted hypokalemia, with Renal Services consulted to replace potassium.  Due to findings of new onset atrial fibrillation, the patient was discharged to Acute Care Services for  ongoing care.  During the the patient's therapies, focused on wheelchair mobility, self propelling motorized wheelchair and supervision with frequent cues for use of functions. Transferred edge of bed using sliding board transfers.  Minimal assist utilizing bed functions.  He was discharged to Acute Care Services for ongoing medical management.     Mariam Dollaraniel Lataria Courser, P.A.   ______________________________ Maryla MorrowAnkit Patel, MD    DA/MEDQ  D:  03/13/2017  T:  03/13/2017  Job:  [id number]  cc:   Maryla MorrowAnkit Patel, MD

## 2017-03-14 NOTE — Progress Notes (Signed)
Pt discharged via bed to 4MW08. Report called to receiving RN. All belongings sent with patient and wife accompanying patient.

## 2017-03-14 NOTE — Progress Notes (Signed)
Patient arrived to unit with spouse. No complaints of pain at this time. Comfortably resting with call bell in place.

## 2017-03-14 NOTE — Progress Notes (Signed)
Patient placed himself on home CPAP unit for the night.  

## 2017-03-14 NOTE — Progress Notes (Signed)
[facility]  Have evaluated the patient today and he remains stable for discharge to CIR. Please see my updated d/c summary from 12/3.   Calvert CantorSaima Naidelin Gugliotta, MD

## 2017-03-14 NOTE — Progress Notes (Signed)
Rehab admissions - Note the patient has had an interrupted stay. On 03/12/17 while on inpatient rehab.  He was found to be acutely confused and was complaining of shortness of breath.  Chest x-ray done at that time did not show anything acute.  Also patient experienced a new onset of atrial fibrillation. Cardiology has seen patient and increased dose of carvediol to 12.5 mg twice daily. Patient also experienced hyperglycemia with CBGs up to 750 on 03/13/17.   CBGs trended down after insulin dose adjusted with latest CBG 354 this am 03/14/18.I met with patient and his wife this am.  He feels he is ready to return to inpatient rehab.  Patient has been cleared by attending MD to return to inpatient rehab.  Again, this is an interrupted stay.  Call me for questions.  #[phone]

## 2017-03-14 NOTE — Plan of Care (Signed)
  Progressing Education: Knowledge of General Education information will improve 03/14/2017 1024 - Progressing by Quentin CornwallMadison, Alexiana Laverdure, RN Health Behavior/Discharge Planning: Ability to manage health-related needs will improve 03/14/2017 1024 - Progressing by Quentin CornwallMadison, Kiran Lapine, RN Clinical Measurements: Ability to maintain clinical measurements within normal limits will improve 03/14/2017 1024 - Progressing by Quentin CornwallMadison, Amalea Ottey, RN Will remain free from infection 03/14/2017 1024 - Progressing by Quentin CornwallMadison, Mica Releford, RN Diagnostic test results will improve 03/14/2017 1024 - Progressing by Quentin CornwallMadison, Angad Nabers, RN Respiratory complications will improve 03/14/2017 1024 - Progressing by Quentin CornwallMadison, Gregory Dowe, RN Cardiovascular complication will be avoided 03/14/2017 1024 - Progressing by Quentin CornwallMadison, Carnelius Hammitt, RN Activity: Risk for activity intolerance will decrease 03/14/2017 1024 - Progressing by Quentin CornwallMadison, Mica Releford, RN Nutrition: Adequate nutrition will be maintained 03/14/2017 1024 - Progressing by Quentin CornwallMadison, Starsky Nanna, RN Coping: Level of anxiety will decrease 03/14/2017 1024 - Progressing by Quentin CornwallMadison, Marcus Groll, RN Elimination: Will not experience complications related to bowel motility 03/14/2017 1024 - Progressing by Quentin CornwallMadison, Kanisha Duba, RN Will not experience complications related to urinary retention 03/14/2017 1024 - Progressing by Quentin CornwallMadison, Melayna Robarts, RN Pain Managment: General experience of comfort will improve 03/14/2017 1024 - Progressing by Quentin CornwallMadison, Cristo Ausburn, RN Safety: Ability to remain free from injury will improve 03/14/2017 1024 - Progressing by Quentin CornwallMadison, Adreena Willits, RN Skin Integrity: Risk for impaired skin integrity will decrease 03/14/2017 1024 - Progressing by Quentin CornwallMadison, Marvine Encalade, RN

## 2017-03-14 NOTE — Plan of Care (Signed)
  Clinical Measurements: Will remain free from infection 03/14/2017 0224 - Progressing by Jeanella Flatteryhomas, Matilynn Dacey T, RN 03/14/2017 0214 - Adequate for Discharge by Jeanella Flatteryhomas, Ashna Dorough T, RN

## 2017-03-15 ENCOUNTER — Inpatient Hospital Stay (HOSPITAL_COMMUNITY): Payer: Medicare Other | Admitting: Occupational Therapy

## 2017-03-15 ENCOUNTER — Inpatient Hospital Stay (HOSPITAL_COMMUNITY): Payer: Medicare Other

## 2017-03-15 ENCOUNTER — Other Ambulatory Visit: Payer: Self-pay

## 2017-03-15 DIAGNOSIS — Z89511 Acquired absence of right leg below knee: Secondary | ICD-10-CM

## 2017-03-15 DIAGNOSIS — I10 Essential (primary) hypertension: Secondary | ICD-10-CM

## 2017-03-15 DIAGNOSIS — K5901 Slow transit constipation: Secondary | ICD-10-CM

## 2017-03-15 DIAGNOSIS — E876 Hypokalemia: Secondary | ICD-10-CM

## 2017-03-15 DIAGNOSIS — N186 End stage renal disease: Secondary | ICD-10-CM

## 2017-03-15 DIAGNOSIS — R6510 Systemic inflammatory response syndrome (SIRS) of non-infectious origin without acute organ dysfunction: Secondary | ICD-10-CM

## 2017-03-15 DIAGNOSIS — D638 Anemia in other chronic diseases classified elsewhere: Secondary | ICD-10-CM

## 2017-03-15 DIAGNOSIS — I5032 Chronic diastolic (congestive) heart failure: Secondary | ICD-10-CM

## 2017-03-15 DIAGNOSIS — Z89512 Acquired absence of left leg below knee: Secondary | ICD-10-CM

## 2017-03-15 DIAGNOSIS — D62 Acute posthemorrhagic anemia: Secondary | ICD-10-CM

## 2017-03-15 DIAGNOSIS — Z992 Dependence on renal dialysis: Secondary | ICD-10-CM

## 2017-03-15 LAB — COMPREHENSIVE METABOLIC PANEL
ALBUMIN: 1.5 g/dL — AB (ref 3.5–5.0)
ALT: 64 U/L — AB (ref 17–63)
AST: 67 U/L — AB (ref 15–41)
Alkaline Phosphatase: 101 U/L (ref 38–126)
Anion gap: 12 (ref 5–15)
BUN: 44 mg/dL — AB (ref 6–20)
CHLORIDE: 94 mmol/L — AB (ref 101–111)
CO2: 26 mmol/L (ref 22–32)
Calcium: 8.7 mg/dL — ABNORMAL LOW (ref 8.9–10.3)
Creatinine, Ser: 5.55 mg/dL — ABNORMAL HIGH (ref 0.61–1.24)
GFR calc Af Amer: 10 mL/min — ABNORMAL LOW (ref >60)
GFR calc non Af Amer: 9 mL/min — ABNORMAL LOW (ref >60)
GLUCOSE: 501 mg/dL — AB (ref 65–99)
POTASSIUM: 3.6 mmol/L (ref 3.5–5.1)
Sodium: 132 mmol/L — ABNORMAL LOW (ref 135–145)
Total Bilirubin: 0.3 mg/dL (ref 0.3–1.2)
Total Protein: 5.7 g/dL — ABNORMAL LOW (ref 6.5–8.1)

## 2017-03-15 LAB — GLUCOSE, CAPILLARY
GLUCOSE-CAPILLARY: 463 mg/dL — AB (ref 65–99)
GLUCOSE-CAPILLARY: 52 mg/dL — AB (ref 65–99)
GLUCOSE-CAPILLARY: 73 mg/dL (ref 65–99)
GLUCOSE-CAPILLARY: 167 mg/dL — AB (ref 65–99)
Glucose-Capillary: 237 mg/dL — ABNORMAL HIGH (ref 65–99)
Glucose-Capillary: 449 mg/dL — ABNORMAL HIGH (ref 65–99)
Glucose-Capillary: 340 mg/dL — ABNORMAL HIGH (ref 65–99)
Glucose-Capillary: 189 mg/dL — ABNORMAL HIGH (ref 65–99)
Glucose-Capillary: 52 mg/dL — ABNORMAL LOW (ref 65–99)

## 2017-03-15 LAB — CBC WITH DIFFERENTIAL/PLATELET
BASOS ABS: 0 10*3/uL (ref 0.0–0.1)
BASOS PCT: 0 %
EOS PCT: 2 %
Eosinophils Absolute: 0.2 10*3/uL (ref 0.0–0.7)
HCT: 29.6 % — ABNORMAL LOW (ref 39.0–52.0)
Hemoglobin: 9.2 g/dL — ABNORMAL LOW (ref 13.0–17.0)
Lymphocytes Relative: 17 %
Lymphs Abs: 1.5 10*3/uL (ref 0.7–4.0)
MCH: 29.9 pg (ref 26.0–34.0)
MCHC: 31.1 g/dL (ref 30.0–36.0)
MCV: 96.1 fL (ref 78.0–100.0)
MONO ABS: 1.2 10*3/uL — AB (ref 0.1–1.0)
Monocytes Relative: 13 %
NEUTROS ABS: 6.1 10*3/uL (ref 1.7–7.7)
Neutrophils Relative %: 68 %
PLATELETS: 492 10*3/uL — AB (ref 150–400)
RBC: 3.08 MIL/uL — AB (ref 4.22–5.81)
RDW: 16.5 % — AB (ref 11.5–15.5)
WBC: 8.9 10*3/uL (ref 4.0–10.5)

## 2017-03-15 MED ORDER — DELFLEX-LC/1.5% DEXTROSE 344 MOSM/L IP SOLN
Freq: Once | INTRAPERITONEAL | Status: AC
  Administered 2017-03-15: 19:00:00 via INTRAPERITONEAL

## 2017-03-15 MED ORDER — GLUCOSE 40 % PO GEL
ORAL | Status: AC
  Administered 2017-03-15: 37.5 g
  Filled 2017-03-15: qty 1

## 2017-03-15 MED ORDER — PRO-STAT SUGAR FREE PO LIQD
30.0000 mL | Freq: Two times a day (BID) | ORAL | Status: DC
  Administered 2017-03-15 – 2017-03-28 (×26): 30 mL via ORAL
  Filled 2017-03-15 (×26): qty 30

## 2017-03-15 MED ORDER — SODIUM CHLORIDE 0.9 % IV SOLN
INTRAVENOUS | Status: AC
  Administered 2017-03-15: 13:00:00 via INTRAVENOUS

## 2017-03-15 MED ORDER — PREMIER PROTEIN SHAKE
2.0000 [oz_av] | Freq: Four times a day (QID) | ORAL | Status: DC
  Administered 2017-03-15 – 2017-03-16 (×4): 2 [oz_av] via ORAL
  Filled 2017-03-15 (×8): qty 325.31

## 2017-03-15 NOTE — IPOC Note (Signed)
Patient presents back to CIR after interrupted stay. Please see previous IPOC.

## 2017-03-15 NOTE — Progress Notes (Signed)
[facility] Progress Note   Assessment/ Plan:   1. SOB - likely due to vol overload, improved after addition of 4.25%.  Will go back to all 2.5% tonight. 2. DM2- glucoses quite elevated, insulin increased per primary with improvement. 3. Afib with RVR - per primary.  4. PAD sp revision L BKA and new R BKA on 11/21 5. [place] [facility] [place].  6. Hypokalemia - Closely following K, on supplementation. 7. HTN/ vol - coreg 6.25mg  hs. 8. MBD of CKD - holding phoslo for lowish/ controlled  Phos = 3.6/3.7 ;  Correc Ca= 8.9  No vit D 9. UTI - blood cx's neg, no urine cx in system. Sp short course abx. 10. Malnutrition - alb 1.3   Prostat, +Nepro.  11. Anemia of CKD - getting darbe 150/wk here, Hb 7-9 range     Subjective:    Blood glucoses still elevated.  UF well with 4.25 and 2.5% mix.  Weights inaccurate.   Objective:   BP (!) 127/56 (BP Location: Right Arm)   Pulse 76   Temp 98 F (36.7 C) (Oral)   Resp 20   Wt 73 kg (160 lb 15 oz)   SpO2 96%   BMI 23.77 kg/m   Physical Exam: General:  Alert ,NAD, OX3 appropriate  Heart: RRR; No m, r, g Lungs: CTA bilat anteriorly, diminished at the bases. Abdomen: soft, non-tender., non distended, PD cath in place. Extremities: L BKA and new R BKA.  Edema much better Dialysis Access: PD cath, L AVF + thrill, bruit not developed well     Labs: BMET Recent Labs  Lab 03/10/17 1203 03/11/17 0730 03/12/17 1429 03/13/17 0516 03/13/17 0951 03/14/17 0603 03/15/17 0643  NA 130*  --  136 132*  --  135 132*  K 2.6*  --  2.3* 3.0*  --  4.2 3.6  CL 93*  --  99* 94*  --  96* 94*  CO2 26  --  29 27  --  25 26  GLUCOSE 328* 607* 141* 624* 750* 328* 501*  BUN 49*  --  52* 49*  --  49* 44*  CREATININE 5.06*  --  5.03* 5.22*  --  5.37* 5.55*  CALCIUM 7.8*  --  8.0* 8.2*  --  8.7* 8.7*  PHOS 3.8  --   --   --   --   --   --    CBC Recent Labs  Lab 03/12/17 1429 03/13/17 0516 03/14/17 0641 03/15/17 0643  WBC  13.1* 9.6 10.2 8.9  NEUTROABS  --   --   --  6.1  HGB 7.9* 9.0* 9.7* 9.2*  HCT 25.2* 28.7* 31.1* 29.6*  MCV 93.7 95.3 94.5 96.1  PLT 416* 457* 462* 492*    @IMGRELPRIORS @ Medications:    . aspirin EC  81 mg Oral Daily  . atorvastatin  80 mg Oral q1800  . calcium acetate  667 mg Oral TID WC  . carvedilol  12.5 mg Oral BID WC  . clopidogrel  75 mg Oral Daily  . docusate sodium  100 mg Oral BID  . gentamicin cream  1 application Topical Daily  . insulin aspart  0-15 Units Subcutaneous TID WC  . insulin NPH Human  50 Units Subcutaneous QHS  . insulin NPH Human  65 Units Subcutaneous QAC breakfast  . multivitamin  1 tablet Oral QHS  . oxyCODONE  2.5 mg Oral NOW  . polyethylene glycol  17 g Oral Daily  .  potassium chloride  30 mEq Oral Daily  . [START ON 03/30/2017] Vitamin D (Ergocalciferol)  50,000 Units Oral Q30 days     Bufford ButtnerElizabeth Maximilliano Kersh MD [facility] pgr [phone] 03/15/2017, 11:01 AM

## 2017-03-15 NOTE — Progress Notes (Signed)
Social Work Patient ID: Joshua [name], male   DOB: 19-Aug-1940, 76 y.o.   MRN: [id number]  Pt returned to CIR 12/4 being re-evaluated and goals being set. Having medical issues today and not able to participate in therapies. Await medical stability for re-evaluation.

## 2017-03-15 NOTE — Patient Care Conference (Signed)
Inpatient RehabilitationTeam Conference and Plan of Care Update Date: 03/15/2017   Time: 2:25 PM    Patient Name: Joshua [name]      Medical Record Number: [id number]  Date of Birth: March 16, 1941 Sex: Male         Room/Bed: 4M08C/4M08C-01 Payor Info: Payor: MEDICARE / Plan: MEDICARE PART A AND B / Product Type: *No Product type* /    Admitting Diagnosis: B BKA  Admit Date/Time:  03/14/2017  3:12 PM Admission Comments: No comment available   Primary Diagnosis:  <principal problem not specified> Principal Problem: <principal problem not specified>  Patient Active Problem List   Diagnosis Date Noted  . Type 2 diabetes mellitus with peripheral neuropathy (HCC)   . New onset atrial fibrillation (HCC)   . Confusion 03/12/2017  . Loose stools   . Essential hypertension   . Acute combined systolic and diastolic congestive heart failure (HCC)   . Poorly controlled type 2 diabetes mellitus with peripheral neuropathy (HCC)   . S/P bilateral below knee amputation (HCC) 03/06/2017  . Constipation due to pain medication   . Coronary artery disease involving native coronary artery of native heart without angina pectoris   . Hyperlipidemia   . S/P unilateral BKA (below knee amputation), right (HCC)   . SIRS (systemic inflammatory response syndrome) (HCC) 03/05/2017  . S/P bilateral BKA (below knee amputation) (HCC) 03/01/2017  . Gangrene of right foot (HCC) 02/23/2017  . Blood in stool   . Wound dehiscence   . Rupture of operation wound   . Toe amputation status, right (HCC)   . Pressure injury of head, stage 3 (HCC)   . Poor nutrition   . Brittle diabetes (HCC)   . Adjustment disorder with depressed mood   . Hypoglycemia   . Ischemic pain of right foot   . Arterial hypotension   . Diabetes mellitus type 2 in obese (HCC)   . PVD (peripheral vascular disease) (HCC)   . Delirium   . History of left below knee amputation (HCC) 01/17/2017  . Acute blood loss anemia   . Elevated AST (SGOT)  01/09/2017  . Chest pain 01/09/2017  . NSTEMI (non-ST elevated myocardial infarction) (HCC) 01/09/2017  . Hypokalemia 01/07/2017  . Hypomagnesemia 01/07/2017  . Left foot infection 01/06/2017  . Chronic hypotension 01/06/2017  . 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 IDDM (insulin dependent diabetes mellitus) (HCC) 01/06/2017  . Foot infection 01/06/2017  . Labile blood pressure   . Hallucination   . Benign essential HTN   . Ulcer of toe of right foot (HCC)   . Ulcer of external ear, limited to breakdown of skin (HCC)   . Chronic congestive heart failure (HCC)   . Anemia of chronic disease   . Labile blood glucose   . 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 Diabetic peripheral neuropathy (HCC)   . Debility 12/27/2016  . History of transmetatarsal amputation of left foot (HCC) 12/27/2016  . Constipation   . Chronic obstructive pulmonary disease (HCC)   . Nausea   . ESRD on dialysis (HCC)   . Leukocytosis   . Tachypnea   . FUO (fever of unknown origin)   . Encephalopathy 12/23/2016  . Sore throat 12/23/2016  . Gangrene of left foot (HCC) 11/26/2016  . PAD (peripheral artery disease) (HCC) 11/26/2016  . Sjogren's syndrome (HCC) 09/26/2015  . Uncontrolled type 2 diabetes mellitus with hyperglycemia, with long-term current use of insulin (HCC) 09/26/2015  .  Hyperosmolar non-ketotic state in patient with type 2 diabetes mellitus (HCC) 09/25/2015  . Ileus (HCC) 09/22/2015  . Acute respiratory failure with hypoxia (HCC) 09/22/2015  . Acute gouty arthritis 09/22/2015  . CAD (coronary artery disease) of artery bypass graft 09/22/2015  . Sepsis (HCC) 09/21/2015  . Elevated troponin level 09/21/2015  . ESRD on peritoneal dialysis (HCC) 09/21/2015  . CVA (cerebral infarction) 09/21/2015    Expected Discharge Date:    Team Members Present: Physician leading conference: Dr. Maryla Morrow Social Worker Present: Dossie Der, LCSW Nurse Present: Chrissie Noa, RN PT Present: Woodfin Ganja, PT OT Present: Perrin Maltese, OT SLP Present: Jackalyn Lombard, SLP PPS Coordinator present : Tora Duck, RN, CRRN     Current Status/Progress Goal Weekly Team Focus  Medical   Decreased functional mobility secondary to right BKA and revision of left BKA 03/01/2017.   Improve mobility, endurance, transfers, Afib  See above   Bowel/Bladder   Oliguric; PD daily; Continent of bowel LBM 12/6.  Having loose BMs at times; on miralax, colace  Remain continent of bowel and bladder with mod I assist  Assess and treat for constipation; assess for need to decrease bowel medication for loose stools.   Swallow/Nutrition/ Hydration   Poor appetite; on premier protein         ADL's             Mobility   Max A slideboard transfers, mod-max bed mobility, supervision w/c, min-mod a sitting balance  Min A transfers, supervision bed mobility, supervision power w/c  endurance, transfers, bed mobility, sitting balance   Communication             Safety/Cognition/ Behavioral Observations            Pain   Pain to right lower extremity stump; tylenol, oxycodone 5 mg in use.  Patient states relief with dangling stump off bed  < 4  Assess and treat for pain q shift and prn   Skin   Right BKA with staples- some darker areas (possibly necrotic); dressing changes performed per order; left stump with sutures and Duda stump shrinker; scab to bridge of nose  No further skin breakdown or infection with mod/max assist.  Assess skin q shift and prn; perform dressing changes per MD order; on air overlay mattress      *See Care Plan and progress notes for long and short-term goals.     Barriers to Discharge  Current Status/Progress Possible Resolutions Date Resolved   Physician    Decreased caregiver support;Medical stability;Home environment access/layout;Wound Care;Lack of/limited family support;Other (comments)  PD  See above   Therapies, follow labs, optimize afib meds, optimize DM/BP meds      Nursing  Wound Care;Weight bearing restrictions;Medical stability               PT                    OT                  SLP                SW Medical stability              Discharge Planning/Teaching Needs:  Home with wife who stays here with him and provides emotional support. Plan to take him home with rehab completed and medically stable      Team Discussion:  Medical isues not able to participate in therapies  due to BP issues and dizziness.  Renal MD consulted and orders .  Revisions to Treatment Plan:  Re-eval    Continued Need for Acute Rehabilitation Level of Care: The patient requires daily medical management by a physician with specialized training in physical medicine and rehabilitation for the following conditions: Daily direction of a multidisciplinary physical rehabilitation program to ensure safe treatment while eliciting the highest outcome that is of practical value to the patient.: Yes Daily medical management of patient stability for increased activity during participation in an intensive rehabilitation regime.: Yes Daily analysis of laboratory values and/or radiology reports with any subsequent need for medication adjustment of medical intervention for : Post surgical problems;Diabetes problems;Wound care problems;Renal problems;Other;Blood pressure problems  Lucy ChrisDupree, Greysin Medlen G 03/17/2017, 8:48 AM

## 2017-03-15 NOTE — Progress Notes (Signed)
Hypoglycemic Event  CBG: 52  Treatment: 15 GM gel  Symptoms: None  Follow-up CBG: Time:1500 CBG Result:52  Possible Reasons for Event: Other: multifactoral- poor appetite, dehydration  Comments/MD notified:Dan Angiulli, PA- monitor    Dani GobbleReardon, Meliyah Simon J

## 2017-03-15 NOTE — Progress Notes (Signed)
1200, became dizzy and lethargic during gym time. Return to bed, manual b/p 70/40, glucoseper CBG 189. Pam Love PA in dept. Call to Dr  Bufford ButtnerElizabeth Upton, renal dr. Ellis ParentsNew order received. Manual b/p rechecked now 80/50

## 2017-03-15 NOTE — H&P (Signed)
Please see progress note for interrupted stay H&P/Prog note

## 2017-03-15 NOTE — Progress Notes (Signed)
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 History taken from chart review, patient, and wife. Presented on 03/01/17 with progressive ischemic changes of the right foot as well as gangrenous changes along the previous left BKA. 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 Pt has been limited by pain, surgeries, and vacs. Wound vacs were later discontinued 03/05/2017 per Dr. Sharol Given. CCPD followed by renal services. Troponin mildly elevated 0.17 felt to be demand ischemia and monitored. Acute on chronic anemia and monitored. Bouts of hypokalemia with supplement added. Patient with low-grade fever and presently maintained on Zosyn with blood cultures negative and urine study pending. PM&R was asked to see the patient regarding potential for inpatient rehab admission. Patient was admitted for a comprehensive rehabilitation program 03/06/2017 with slow progressive gains. Peritoneal dialysis ongoing as per renal services. Attending therapy working with energy conservation techniques.. Transfers moderate assist using upper extremities. Patient developed hypokalemia 2.3 and supplement added per renal services. Latest potassium of 3.0 to 4.2 receiving potassium chloride 30 mEq daily 5 days. An EKG was initiated due to some increasing shortness of breath that showed atrial fibrillation of new onset with RVR. Patient was discharged to acute care services 03/12/2017. Troponin 0.31.  Cardiology services consulted initially received Cardizem. Review of prior echocardiogram with ejection fraction of 35% and repeated 03/13/2017 showing ejection fraction 30% mild hypokinesis of the anterior septal, inferior septal and apical inferior apical myocardium which was reviewed by cardiology services as well as grade 2 diastolic dysfunction.. Elevated troponin felt to be related to demand ischemia. Patient's Coreg was increased to 12.5 mg twice a day with plan to avoid Cardizem if possible in the setting of cardiomyopathy. Patient with elevations in blood sugars with home insulin therapy resumed and adjusted. Patient stabilized felt the patient could return back to inpatient rehabilitation services and was readmitted for a comprehensive rehabilitation program  ROS HENT: Negative for hearing loss.   Eyes: Negative for blurred vision and double vision.  Respiratory: Negative for cough.        Shortness of breath with exertion  Cardiovascular: Positive for leg swelling. Chest tightness as well as palpitations  Gastrointestinal: Positive for constipation. Negative for nausea.       GERD  Genitourinary: Negative for hematuria.  Musculoskeletal: Positive for joint pain and myalgias.  Skin: Negative for rash.  Neurological: Positive for focal weakness. Negative for seizures.  All other systems reviewed and are negative    Past Medical History:  Diagnosis Date  . AICD (automatic cardioverter/defibrillator) present    [employer] [phone] lead [id number]  . CHF (congestive heart failure) ([place])   . Complication of anesthesia   . COPD (chronic obstructive pulmonary disease) ([place])   . Coronary artery disease    CABG 2011  . Diabetes mellitus without complication ([place])   . ESRD on peritoneal dialysis [facility])    Started PD Dec 2016 in [place]. Nephrology is in [place].   [name]  Gangrene ([place])    left knee  . GERD (gastroesophageal reflux disease)   . History of peritonitis    PD  cath related peritonitis in April 2017  . Hypertension   . Peripheral vascular disease ([place])   . PONV (postoperative nausea and vomiting)   . Sjogren's syndrome ([place])    Per pt's wife, was diagnosed in 2016 during w/u for cause of renal failure prior to starting dialysis.  He had a rash on his chest apparently prompting this w/u.  Never had renal bx.  He was referred to a rheum MD in [place] and per the wife he was treated with MTX and other medications but had side effects to "all of it" and isn't taking anything for this as of Jun 2017.   [name] Sleep apnea    wears Bipap  . Stroke ([place])   . Wound dehiscence    Right foot   Past Surgical History:  Procedure Laterality Date  . ABDOMINAL AORTOGRAM W/LOWER EXTREMITY N/A 11/29/2016   Procedure: ABDOMINAL AORTOGRAM W/LOWER EXTREMITY;  Surgeon: Serafina Mitchell, MD;  Location: [place] [facility];  Service: Cardiovascular;  Laterality: N/A;  . AMPUTATION Left 11/29/2016   Procedure: LEFT THIRD TOE AMPUTATION;  Surgeon: Serafina Mitchell, MD;  Location: [facility] OR;  Service: Vascular;  Laterality: Left;  . AMPUTATION Left 12/23/2016   Procedure: Left Transmetatarsal Amputation;  Surgeon: Newt Minion, MD;  Location: [place];  Service: Orthopedics;  Laterality: Left;  . AMPUTATION Left 01/11/2017   Procedure: LEFT BELOW KNEE AMPUTATION;  Surgeon: Newt Minion, MD;  Location: [place];  Service: Orthopedics;  Laterality: Left;  . AMPUTATION Right 01/20/2017   Procedure: AMPUTATION RIGHT FIFTH TOE;  Surgeon: Serafina Mitchell, MD;  Location: [place];  Service: Vascular;  Laterality: Right;  . AMPUTATION Right 03/01/2017   Procedure: RIGHT BELOW KNEE AMPUTATION;  Surgeon: Newt Minion, MD;  Location: [place];  Service: Orthopedics;  Laterality: Right;  . ANGIOPLASTY  12/15/2016   Procedure: BALLOON ANGIOPLASTY;  Surgeon: Waynetta Sandy, MD;  Location: [place];  Service: Vascular;;  . cardiac catherization with stent placement  2017  . COLONOSCOPY W/ BIOPSIES  AND POLYPECTOMY    . CORONARY ARTERY BYPASS GRAFT  2011  . FALSE ANEURYSM REPAIR Right 12/15/2016   Procedure: REPAIR FALSE ANEURYSM;  Surgeon: Waynetta Sandy, MD;  Location: [place];  Service: Vascular;  Laterality: Right;  . IRRIGATION AND DEBRIDEMENT FOOT Left 12/15/2016   Procedure: IRRIGATION AND DEBRIDEMENT FOOT;  Surgeon: Waynetta Sandy, MD;  Location: [place];  Service: Vascular;  Laterality: Left;  . LEFT HEART CATH AND CORS/GRAFTS ANGIOGRAPHY N/A 01/09/2017   Procedure: LEFT HEART CATH AND CORS/GRAFTS ANGIOGRAPHY;  Surgeon: Lorretta Harp, MD;  Location: [place] [facility];  Service: Cardiovascular;  Laterality: N/A;  . LOWER EXTREMITY ANGIOGRAM Left 12/15/2016   Procedure: LOWER EXTREMITY ANGIOGRAM; PEDAL ACCESS;  Surgeon: Waynetta Sandy, MD;  Location: [place];  Service: Vascular;  Laterality: Left;  . PERIPHERAL VASCULAR BALLOON ANGIOPLASTY  11/29/2016   Procedure: PERIPHERAL VASCULAR BALLOON ANGIOPLASTY;  Surgeon: Serafina Mitchell, MD;  Location: [place] [facility];  Service: Cardiovascular;;  LT Peroneal AT attempted unsuccessful  . STUMP REVISION Left 03/01/2017   Procedure: REVISION LEFT BELOW KNEE AMPUTATION;  Surgeon: Newt Minion, MD;  Location: [place];  Service: Orthopedics;  Laterality: Left;   Family History  Problem Relation Age of Onset  . Heart disease Father   . Diabetes  Mother    Social History:  reports that he has quit smoking. His smoking use included pipe. he has never used smokeless tobacco. He reports that he does not drink alcohol or use drugs. Allergies:  Allergies  Allergen Reactions  . Gabapentin (Once-Daily) Hypertension  . Lisinopril Hives  . Methotrexate Derivatives Other (See Comments)    blisters  . Sulfa Antibiotics Hives  . Tape Hives  . Vancomycin Other (See Comments)    Blisters   . Zanaflex [Tizanidine Hcl]     Hypotensive stroke   Medications Prior to Admission  Medication Sig Dispense Refill  . aspirin EC  81 MG tablet Take 81 mg by mouth daily.    [name] atorvastatin (LIPITOR) 80 MG tablet Take 1 tablet (80 mg total) by mouth daily at 6 PM. 30 tablet 0  . calcium acetate (PHOSLO) 667 MG capsule Take 1 capsule (667 mg total) by mouth 3 (three) times daily with meals. 90 capsule 0  . carvedilol (COREG) 6.25 MG tablet Take 1 tablet (6.25 mg total) by mouth at bedtime. 30 tablet 0  . clopidogrel (PLAVIX) 75 MG tablet Take 1 tablet (75 mg total) by mouth daily. 30 tablet 0  . cyclobenzaprine (FLEXERIL) 10 MG tablet Take 1 tablet (10 mg total) 2 (two) times daily as needed by mouth for muscle spasms. 30 tablet 0  . docusate sodium (COLACE) 100 MG capsule Take 100 mg by mouth 2 (two) times daily.     . insulin NPH Human (HUMULIN N,NOVOLIN N) 100 UNIT/ML injection Inject 0.1 mLs (10 Units total) into the skin daily before breakfast. 10 mL 11  . insulin NPH Human (HUMULIN N,NOVOLIN N) 100 UNIT/ML injection Inject 0.65 mLs (65 Units total) into the skin at bedtime. 10 mL 11  . multivitamin (RENA-VIT) TABS tablet Take 1 tablet by mouth at bedtime.  0  . nitroGLYCERIN (NITROSTAT) 0.4 MG SL tablet Place 0.4 mg under the tongue every 5 (five) minutes as needed for chest pain.    [name] oxyCODONE (OXY IR/ROXICODONE) 5 MG immediate release tablet Take 0.5 tablets (2.5 mg total) by mouth every 6 (six) hours as needed for severe pain. 20 tablet 0  . polyethylene glycol (MIRALAX / GLYCOLAX) packet Take 17 g by mouth daily.    [name] RELION INSULIN SYRINGE 31G X 15/64" 1 ML MISC     . silver sulfADIAZINE (SILVADENE) 1 % cream Apply 1 application daily topically. 50 g 0  . Vitamin D, Ergocalciferol, (DRISDOL) 50000 units CAPS capsule Take 50,000 Units by mouth every 30 (thirty) days.       Home: Home Living Family/patient expects to be discharged to:: Private residence Living Arrangements: Spouse/significant other   Functional History:    Functional Status:  Mobility:          ADL:     Cognition: Cognition Orientation Level: Oriented X4    Physical Exam: Blood pressure (!) 145/55, pulse 89, temperature 97.6 F (36.4 C), temperature source Oral, resp. rate 18, height '5\' 9"'$  (1.753 m), weight 68 kg (149 lb 14.6 oz), SpO2 98 %. Physical Exam Vitals reviewed. Constitutional: He is oriented to person, place, and time. He appears well-developed and well-nourished.  HENT:  Head: Normocephalic. Ulcer on bridge of nose healing Eyes: EOM are normal. Right eye exhibits no discharge. Left eye exhibits no discharge.  Neck: Normal range of motion. Neck supple. No thyromegaly present.  Cardiovascular: Regular rate and rhythm  Respiratory: Effort normal and breath sounds normal. No respiratory distress. He has  no wheezes.  GI: Soft. Bowel sounds are normal. He exhibits no distension. There is no tenderness.  Musculoskeletal: He exhibits edema and tenderness.  Neurological: He is alert and oriented to person, place, and time.  Motor: B/l UE 4+/5 proximal to distal RLE: HF 2-/5 (pain inhibition) LLE: HF 3+/5 (pain inhibition)  Skin:  See above B/l BKA with dressing clean and dry   Results for orders placed or performed during the hospital encounter of 03/12/17 (from the past 48 hour(s))  Urinalysis, Routine w reflex microscopic     Status: Abnormal   Collection Time: 03/12/17  1:15 AM  Result Value Ref Range   Color, Urine AMBER (A) YELLOW    Comment: BIOCHEMICALS MAY BE AFFECTED BY COLOR   APPearance CLOUDY (A) CLEAR   Specific Gravity, Urine 1.014 1.005 - 1.030   pH 5.0 5.0 - 8.0   Glucose, UA >=500 (A) NEGATIVE mg/dL   Hgb urine dipstick MODERATE (A) NEGATIVE   Bilirubin Urine NEGATIVE NEGATIVE   Ketones, ur NEGATIVE NEGATIVE mg/dL   Protein, ur 100 (A) NEGATIVE mg/dL   Nitrite NEGATIVE NEGATIVE   Leukocytes, UA LARGE (A) NEGATIVE   RBC / HPF 6-30 0 - 5 RBC/hpf   WBC, UA TOO NUMEROUS TO COUNT 0 - 5 WBC/hpf   Bacteria, UA FEW (A) NONE SEEN   Squamous Epithelial /  LPF 0-5 (A) NONE SEEN   WBC Clumps PRESENT   Blood gas, arterial     Status: Abnormal   Collection Time: 03/12/17  7:40 PM  Result Value Ref Range   FIO2 21.00    pH, Arterial 7.518 (H) 7.350 - 7.450   pCO2 arterial 36.0 32.0 - 48.0 mmHg   pO2, Arterial 83.2 83.0 - 108.0 mmHg   Bicarbonate 29.1 (H) 20.0 - 28.0 mmol/L   Acid-Base Excess 5.9 (H) 0.0 - 2.0 mmol/L   O2 Saturation 97.4 %   Patient temperature 98.6    Collection site RIGHT BRACHIAL    Drawn by [id number]    Sample type ARTERIAL DRAW    Allens test (pass/fail) PASS PASS  Ammonia     Status: None   Collection Time: 03/12/17  8:08 PM  Result Value Ref Range   Ammonia 30 9 - 35 umol/L  Lactic acid, plasma     Status: None   Collection Time: 03/12/17  8:08 PM  Result Value Ref Range   Lactic Acid, Venous 1.9 0.5 - 1.9 mmol/L  TSH     Status: None   Collection Time: 03/12/17  8:08 PM  Result Value Ref Range   TSH 4.190 0.350 - 4.500 uIU/mL    Comment: Performed by a 3rd Generation assay with a functional sensitivity of <=0.01 uIU/mL.  Procalcitonin - Baseline     Status: None   Collection Time: 03/12/17  8:08 PM  Result Value Ref Range   Procalcitonin 0.48 ng/mL    Comment:        Interpretation: PCT (Procalcitonin) <= 0.5 ng/mL: Systemic infection (sepsis) is not likely. Local bacterial infection is possible. (NOTE)       Sepsis PCT Algorithm           Lower Respiratory Tract                                      Infection PCT Algorithm    ----------------------------     ----------------------------  PCT < 0.25 ng/mL                PCT < 0.10 ng/mL         Strongly encourage             Strongly discourage   discontinuation of antibiotics    initiation of antibiotics    ----------------------------     -----------------------------       PCT 0.25 - 0.50 ng/mL            PCT 0.10 - 0.25 ng/mL               OR       >80% decrease in PCT            Discourage initiation of                                             antibiotics      Encourage discontinuation           of antibiotics    ----------------------------     -----------------------------         PCT >= 0.50 ng/mL              PCT 0.26 - 0.50 ng/mL               AND        <80% decrease in PCT             Encourage initiation of                                             antibiotics       Encourage continuation           of antibiotics    ----------------------------     -----------------------------        PCT >= 0.50 ng/mL                  PCT > 0.50 ng/mL               AND         increase in PCT                  Strongly encourage                                      initiation of antibiotics    Strongly encourage escalation           of antibiotics                                     -----------------------------                                           PCT <= 0.25 ng/mL  OR                                        > 80% decrease in PCT                                     Discontinue / Do not initiate                                             antibiotics   D-dimer, quantitative (not at [facility])     Status: Abnormal   Collection Time: 03/12/17  8:08 PM  Result Value Ref Range   D-Dimer, Quant 1.27 (H) 0.00 - 0.50 ug/mL-FEU    Comment: (NOTE) At the manufacturer cut-off of 0.50 ug/mL FEU, this assay has been documented to exclude PE with a sensitivity and negative predictive value of 97 to 99%.  At this time, this assay has not been approved by the FDA to exclude DVT/VTE. Results should be correlated with clinical presentation.   Troponin I     Status: Abnormal   Collection Time: 03/12/17  8:08 PM  Result Value Ref Range   Troponin I 0.52 (HH) <0.03 ng/mL    Comment: CRITICAL RESULT CALLED TO, READ BACK BY AND VERIFIED WITH: LEBRON,Y RN 03/12/2017 2139 JORDANS   Culture, blood (routine x 2)     Status: None (Preliminary result)   Collection Time: 03/12/17  8:10 PM  Result  Value Ref Range   Specimen Description BLOOD RIGHT HAND    Special Requests IN PEDIATRIC BOTTLE Blood Culture adequate volume    Culture NO GROWTH < 12 HOURS    Report Status PENDING   Culture, blood (routine x 2)     Status: None (Preliminary result)   Collection Time: 03/12/17  8:20 PM  Result Value Ref Range   Specimen Description BLOOD RIGHT THUMB    Special Requests IN PEDIATRIC BOTTLE Blood Culture adequate volume    Culture NO GROWTH < 12 HOURS    Report Status PENDING   Glucose, capillary     Status: Abnormal   Collection Time: 03/12/17  9:09 PM  Result Value Ref Range   Glucose-Capillary 237 (H) 65 - 99 mg/dL   Comment 1 Notify RN    Comment 2 Document in Chart   Lactic acid, plasma     Status: Abnormal   Collection Time: 03/12/17 11:33 PM  Result Value Ref Range   Lactic Acid, Venous 2.2 (HH) 0.5 - 1.9 mmol/L    Comment: CRITICAL RESULT CALLED TO, READ BACK BY AND VERIFIED WITH: CVIJETIC,Y RN 03/13/2017 0106 JORDANS   Troponin I (q 6hr x 3)     Status: Abnormal   Collection Time: 03/12/17 11:33 PM  Result Value Ref Range   Troponin I 0.45 (HH) <0.03 ng/mL    Comment: CRITICAL VALUE NOTED.  VALUE IS CONSISTENT WITH PREVIOUSLY REPORTED AND CALLED VALUE.  Basic metabolic panel     Status: Abnormal   Collection Time: 03/13/17  5:16 AM  Result Value Ref Range   Sodium 132 (L) 135 - 145 mmol/L   Potassium 3.0 (L) 3.5 - 5.1 mmol/L   Chloride 94 (L) 101 - 111 mmol/L   CO2  27 22 - 32 mmol/L   Glucose, Bld 624 (HH) 65 - 99 mg/dL    Comment: CRITICAL RESULT CALLED TO, READ BACK BY AND VERIFIED WITH: MIRANDA SURLES,RN AT 0745 03/13/17 BY ZBEECH,    BUN 49 (H) 6 - 20 mg/dL   Creatinine, Ser 5.22 (H) 0.61 - 1.24 mg/dL   Calcium 8.2 (L) 8.9 - 10.3 mg/dL   GFR calc non Af Amer 10 (L) >60 mL/min   GFR calc Af Amer 11 (L) >60 mL/min    Comment: (NOTE) The eGFR has been calculated using the CKD EPI equation. This calculation has not been validated in all clinical  situations. eGFR's persistently <60 mL/min signify possible Chronic Kidney Disease.    Anion gap 11 5 - 15  CBC     Status: Abnormal   Collection Time: 03/13/17  5:16 AM  Result Value Ref Range   WBC 9.6 4.0 - 10.5 K/uL   RBC 3.01 (L) 4.22 - 5.81 MIL/uL   Hemoglobin 9.0 (L) 13.0 - 17.0 g/dL   HCT 28.7 (L) 39.0 - 52.0 %   MCV 95.3 78.0 - 100.0 fL   MCH 29.9 26.0 - 34.0 pg   MCHC 31.4 30.0 - 36.0 g/dL   RDW 16.1 (H) 11.5 - 15.5 %   Platelets 457 (H) 150 - 400 K/uL  Troponin I (q 6hr x 3)     Status: Abnormal   Collection Time: 03/13/17  5:16 AM  Result Value Ref Range   Troponin I 0.41 (HH) <0.03 ng/mL    Comment: CRITICAL VALUE NOTED.  VALUE IS CONSISTENT WITH PREVIOUSLY REPORTED AND CALLED VALUE.  Magnesium     Status: None   Collection Time: 03/13/17  5:16 AM  Result Value Ref Range   Magnesium 1.7 1.7 - 2.4 mg/dL  Glucose, capillary     Status: Abnormal   Collection Time: 03/13/17  7:31 AM  Result Value Ref Range   Glucose-Capillary >600 (HH) 65 - 99 mg/dL   Comment 1 Notify RN   Glucose, capillary     Status: Abnormal   Collection Time: 03/13/17  7:34 AM  Result Value Ref Range   Glucose-Capillary >600 (HH) 65 - 99 mg/dL   Comment 1 Notify RN   Glucose, random     Status: Abnormal   Collection Time: 03/13/17  9:51 AM  Result Value Ref Range   Glucose, Bld 750 (HH) 65 - 99 mg/dL    Comment: CRITICAL RESULT CALLED TO, READ BACK BY AND VERIFIED WITH: MIRANDA SURLES,RN AT 1046 03/13/17 BY ZBEECH.   Troponin I (q 6hr x 3)     Status: Abnormal   Collection Time: 03/13/17  9:55 AM  Result Value Ref Range   Troponin I 0.31 (HH) <0.03 ng/mL    Comment: CRITICAL VALUE NOTED.  VALUE IS CONSISTENT WITH PREVIOUSLY REPORTED AND CALLED VALUE.  Glucose, capillary     Status: Abnormal   Collection Time: 03/13/17 11:46 AM  Result Value Ref Range   Glucose-Capillary >600 (HH) 65 - 99 mg/dL   Comment 1 Notify RN   Glucose, capillary     Status: Abnormal   Collection Time:  03/13/17 11:49 AM  Result Value Ref Range   Glucose-Capillary 592 (HH) 65 - 99 mg/dL   Comment 1 Notify RN    Dg Chest Port 1 View  Result Date: 03/12/2017 CLINICAL DATA:  Shortness of breath. EXAM: PORTABLE CHEST 1 VIEW COMPARISON:  03/05/2017 FINDINGS: 1328 hours. Low lung volumes. The cardio pericardial silhouette is  enlarged. There is pulmonary vascular congestion without overt pulmonary edema. Left defibrillator again noted. Patient is status post CABG. The visualized bony structures of the thorax are intact. IMPRESSION: Cardiomegaly with vascular congestion. Electronically Signed   By: Misty Stanley M.D.   On: 03/12/2017 13:58       Medical Problem List and Plan: 1.  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 Bilateral wound vacs discontinued 03/05/2017 per Dr. Sharol Given 2.  DVT Prophylaxis/Anticoagulation: Patient is bilateral BKA 3. Pain Management: Flexeril and Oxycodone as needed 4. Mood: Provide emotional support 5. Neuropsych: This patient is capable of making decisions on his own behalf. 6. Skin/Wound Care: Routine skin checks 7. Fluids/Electrolytes/Nutrition: Routine I&O's with follow-up chemistries 8. Acute on chronic anemia. Continue Aranesp 9. End-stage renal disease/CPPD. Follow-up renal services 10. Diabetes mellitus peripheral neuropathy. Latest hemoglobin A1c 8.8. Home regimen adjusted due to some hyperglycemia with NPH insulin 65 units daily at breakfast and 50 units daily at bedtime. Check blood sugars before meals and at bedtime.  11. CAD with CABG. Continue Plavix and aspirin. No chest pain or shortness of breath 12. Diastolic congestive heart failure. Monitor for any signs of fluid overload 13. SIRS. Antibiotics completed. Blood cultures no growth. Monitor for any fever 14. Hypertension. Coreg 12.5 mg twice daily  15. New onset atrial fibrillation. Follow-up cardiology services. Coreg increased to 12.5 mg twice daily 16.  Hyperlipidemia. Lipitor 17. Hypokalemia. Continue potassium supplement. Follow-up chemistries 18. Constipation. Laxative assistance   Delice Lesch, MD, ABPMR Lavon Paganini Angiulli, PA-C 03/15/2017

## 2017-03-15 NOTE — Progress Notes (Signed)
Occupational Therapy Note  Patient Details  Name: Joshua [name] MRN: [id number] Date of Birth: February 01, 1941  Today's Date: 03/15/2017 OT Individual Time:  -  Pt with low BP during PT now in bed resting.  Nursing advised therapist to not proceed with any activity at this time secondary to medical issues.  Supervisor notified that re-eval will need to be completed tomorrow for OT.   Pt missed 60 mins OT session.    Antonios Ostrow,[place] OTR/L  03/15/2017, 1:11 PM

## 2017-03-15 NOTE — Progress Notes (Signed)
PD tx initiated via tenckhoff w/o problem, VSS, report given to Ronny BaconWhitney Reardon, RN

## 2017-03-15 NOTE — Progress Notes (Signed)
Mr. Charlott HollerCompton has returned to CIR from an interrupted stay on acute services from 03/12/17 to 03/14/17, meeting Medicare guidelines for an interrupted stay.  Admitted to CIR on 03/06/17.  Records and accounts will be merged past discharge.  Please refer to [phone] for all admission information including pre-admission screen, H&P with Post Admission Physician Evaluation, IPOC, evaluations, progress notes and team conferences for 11/26 to 12/2.

## 2017-03-15 NOTE — Significant Event (Signed)
Patients lunch time blood sugar 189.  Patient with no appetite and expresses he does not want to eat his meal.  Offered nutritional supplement, held sliding scale insulin coverage, and notified Deatra Inaan Angiulli, PA.  He recommended holding insulin unless patient eats his meal tray to avoid a dangerous blood sugar drop.  Dani Gobbleeardon, Kairy Folsom J, RN

## 2017-03-15 NOTE — Evaluation (Signed)
Physical Therapy Assessment and Plan (Re-evaluation)  Patient Details  Name: [name] MRN: [id number] Date of Birth: December 09, 1940   PT Diagnosis: Impaired sensation and Muscle weakness Rehab Potential: Good ELOS: 10-14 days   Today's Date: 03/15/2017 PT Individual Time: 1100-1200,  PT Individual Time Calculation (min): 60 min Missed Time: 75 min    Problem List:      Patient Active Problem List   Diagnosis Date Noted  . Essential hypertension   . Acute combined systolic and diastolic congestive heart failure ([place])   . Poorly controlled type 2 diabetes mellitus with peripheral neuropathy ([place])   . S/P bilateral below knee amputation ([place]) 03/06/2017  . Constipation due to pain medication   . Coronary artery disease involving native coronary artery of native heart without angina pectoris   . Hyperlipidemia   . S/P unilateral BKA (below knee amputation), right ([place])   . SIRS (systemic inflammatory response syndrome) ([place]) 03/05/2017  . S/P bilateral BKA (below knee amputation) ([place]) 03/01/2017  . Gangrene of right foot ([place]) 02/23/2017  . Blood in stool   . Wound dehiscence   . Rupture of operation wound   . Toe amputation status, right ([place])   . Pressure injury of head, stage 3 ([place])   . Poor nutrition   . 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 Adjustment disorder with depressed mood   . Hypoglycemia   . Ischemic pain of right foot   . Arterial hypotension   . Diabetes mellitus type 2 in obese ([place])   . PVD (peripheral vascular disease) ([place])   . Delirium   . History of left below knee amputation ([place]) 01/17/2017  . Acute blood loss anemia   . Elevated AST (SGOT) 01/09/2017  . Chest pain 01/09/2017  . NSTEMI (non-ST elevated myocardial infarction) ([place]) 01/09/2017  . Hypokalemia 01/07/2017  . Hypomagnesemia 01/07/2017  . Left foot infection 01/06/2017  . Chronic hypotension 01/06/2017  . ICD (implantable cardioverter-defibrillator) in place  01/06/2017  . IDDM (insulin dependent diabetes mellitus) ([place]) 01/06/2017  . Foot infection 01/06/2017  . Labile blood pressure   . Hallucination   . Benign essential HTN   . Ulcer of toe of right foot ([place])   . Ulcer of external ear, limited to breakdown of skin ([place])   . Chronic congestive heart failure ([place])   . Anemia of chronic disease   . Labile blood glucose   . Uncontrolled diabetes mellitus type 2 with peripheral artery disease ([place])   . Diabetic peripheral neuropathy ([place])   . Debility 12/27/2016  . History of transmetatarsal amputation of left foot ([place]) 12/27/2016  . Constipation   . Chronic obstructive pulmonary disease ([place])   . Nausea   . ESRD on dialysis ([place])   . Leukocytosis   . Tachypnea   . FUO (fever of unknown origin)   . Encephalopathy 12/23/2016  . Sore throat 12/23/2016  . Gangrene of left foot ([place]) 11/26/2016  . PAD (peripheral artery disease) ([place]) 11/26/2016  . Sjogren's syndrome ([place]) 09/26/2015  . Uncontrolled type 2 diabetes mellitus with hyperglycemia, with long-term current use of insulin ([place]) 09/26/2015  . Hyperosmolar non-ketotic state in patient with type 2 diabetes mellitus ([place]) 09/25/2015  . Ileus ([place]) 09/22/2015  . Acute respiratory failure with hypoxia ([place]) 09/22/2015  . Acute gouty arthritis 09/22/2015  . CAD (coronary artery disease) of artery bypass graft 09/22/2015  . Sepsis ([place]) 09/21/2015  . Elevated troponin level 09/21/2015  . ESRD on peritoneal dialysis ([place]) 09/21/2015  .  CVA (cerebral infarction) 09/21/2015    Past Medical History:      Past Medical History:  Diagnosis Date  . AICD (automatic cardioverter/defibrillator) present    [employer] [phone] lead [id number]  . CHF (congestive heart failure) ([place])   . Complication of anesthesia   . COPD (chronic obstructive pulmonary disease) ([place])   . Coronary artery disease    CABG 2011  . Diabetes mellitus without complication  ([place])   . ESRD on peritoneal dialysis [facility])    Started PD Dec 2016 in [place]. Nephrology is in [place].   . Gangrene ([place])    left knee  . GERD (gastroesophageal reflux disease)   . History of peritonitis    PD cath related peritonitis in April 2017  . Hypertension   . Peripheral vascular disease ([place])   . PONV (postoperative nausea and vomiting)   . Sjogren's syndrome ([place])    Per pt's wife, was diagnosed in 2016 during w/u for cause of renal failure prior to starting dialysis.  He had a rash on his chest apparently prompting this w/u.  Never had renal bx.  He was referred to a rheum MD in [place] and per the wife he was treated with MTX and other medications but had side effects to "all of it" and isn't taking anything for this as of Jun 2017.   [name] Sleep apnea    wears Bipap  . Stroke ([place])   . Wound dehiscence    Right foot   Past Surgical History:       Past Surgical History:  Procedure Laterality Date  . ABDOMINAL AORTOGRAM W/LOWER EXTREMITY N/A 11/29/2016   Procedure: ABDOMINAL AORTOGRAM W/LOWER EXTREMITY;  Surgeon: Serafina Mitchell, MD;  Location: [facility];  Service: Cardiovascular;  Laterality: N/A;  . AMPUTATION Left 11/29/2016   Procedure: LEFT THIRD TOE AMPUTATION;  Surgeon: Serafina Mitchell, MD;  Location: [facility] OR;  Service: Vascular;  Laterality: Left;  . AMPUTATION Left 12/23/2016   Procedure: Left Transmetatarsal Amputation;  Surgeon: Newt Minion, MD;  Location: [facility];  Service: Orthopedics;  Laterality: Left;  . AMPUTATION Left 01/11/2017   Procedure: LEFT BELOW KNEE AMPUTATION;  Surgeon: Newt Minion, MD;  Location: [facility];  Service: Orthopedics;  Laterality: Left;  . AMPUTATION Right 01/20/2017   Procedure: AMPUTATION RIGHT FIFTH TOE;  Surgeon: Serafina Mitchell, MD;  Location: [facility];  Service: Vascular;  Laterality: Right;  . AMPUTATION Right 03/01/2017   Procedure: RIGHT BELOW KNEE AMPUTATION;  Surgeon: Newt Minion, MD;   Location: [facility];  Service: Orthopedics;  Laterality: Right;  . ANGIOPLASTY  12/15/2016   Procedure: BALLOON ANGIOPLASTY;  Surgeon: Waynetta Sandy, MD;  Location: [facility];  Service: Vascular;;  . cardiac catherization with stent placement  2017  . COLONOSCOPY W/ BIOPSIES AND POLYPECTOMY    . CORONARY ARTERY BYPASS GRAFT  2011  . FALSE ANEURYSM REPAIR Right 12/15/2016   Procedure: REPAIR FALSE ANEURYSM;  Surgeon: Waynetta Sandy, MD;  Location: [facility];  Service: Vascular;  Laterality: Right;  . IRRIGATION AND DEBRIDEMENT FOOT Left 12/15/2016   Procedure: IRRIGATION AND DEBRIDEMENT FOOT;  Surgeon: Waynetta Sandy, MD;  Location: [facility];  Service: Vascular;  Laterality: Left;  . LEFT HEART CATH AND CORS/GRAFTS ANGIOGRAPHY N/A 01/09/2017   Procedure: LEFT HEART CATH AND CORS/GRAFTS ANGIOGRAPHY;  Surgeon: Lorretta Harp, MD;  Location: [facility];  Service: Cardiovascular;  Laterality: N/A;  . LOWER EXTREMITY  ANGIOGRAM Left 12/15/2016   Procedure: LOWER EXTREMITY ANGIOGRAM; PEDAL ACCESS;  Surgeon: Waynetta Sandy, MD;  Location: [facility];  Service: Vascular;  Laterality: Left;  . PERIPHERAL VASCULAR BALLOON ANGIOPLASTY  11/29/2016   Procedure: PERIPHERAL VASCULAR BALLOON ANGIOPLASTY;  Surgeon: Serafina Mitchell, MD;  Location: [facility];  Service: Cardiovascular;;  LT Peroneal AT attempted unsuccessful  . STUMP REVISION Left 03/01/2017   Procedure: REVISION LEFT BELOW KNEE AMPUTATION;  Surgeon: Newt Minion, MD;  Location: [facility];  Service: Orthopedics;  Laterality: Left;    Assessment & Plan Clinical Impression: Patient is a 76 y.o.malewith a history of diabetes mellitus and peripheral neuropathy, diastolic congestive heart failure, COPD, CAD with CABG 2011 and maintained on Plavix as well as aspirin, ESRD on PD as well as recent left BKA 12/23/2016 and 5th toe amp on right and received inpatient rehabilitation services 01/17/2017 until  02/07/2017.History taken from chart review, patient, and wife.Presented on 03/01/17 with progressive ischemic changes of the right foot as well as gangrenous changes along the previous left BKA. 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 Pt has been limited by pain, surgeries, and vacs. Wound vacs were later discontinued 03/05/2017 per Dr. Sharol Given. CCPD followed by renal services. Troponin mildly elevated 0.17 felt to be demand ischemia and monitored. Acute on chronic anemia and monitored. Bouts of hypokalemia with supplement added. Patient with low-grade fever and presently maintained on Zosyn with blood cultures negative and urine study pending. PM&R was asked to see the patient regarding potential for inpatient rehab admission. Patient transferred to CIR on 03/06/2017 .   Patient currently requires max with mobility secondary to muscle weakness, decreased cardiorespiratoy endurance and decreased sitting balance and decreased balance strategies.  Prior to hospitalization, patient was min assist to supervision with mobility and lived with Spouse in a Mobile home home.  Home access is  Ramped entrance.  Patient will benefit from skilled PT intervention to maximize safe functional mobility, minimize fall risk and decrease caregiver burden for planned discharge home with 24 hour assist.  Anticipate patient will benefit from follow up [facility] at discharge.  PT - End of Session Activity Tolerance: Tolerates < 10 min activity, no significant change in vital signs Endurance Deficit: Yes Endurance Deficit Description: fatigues quickly and moderate increase in work of breathing w/ most mobility PT Assessment Rehab Potential (ACUTE/IP ONLY): Good PT Barriers to Discharge: [place] home environment;Home environment access/layout;Wound Care PT Barriers to Discharge Comments: Wife very involved and received a lot of EDU during last admission, has ramp   PT Patient demonstrates impairments in the following area(s): Balance;Endurance;Pain;Sensory;Skin Integrity PT Transfers Functional Problem(s): Bed Mobility;Bed to Chair;Car;Furniture PT Locomotion Functional Problem(s): Wheelchair Mobility PT Plan PT Intensity: Minimum of 1-2 x/day ,45 to 90 minutes PT Frequency: 5 out of 7 days PT Duration Estimated Length of Stay: 10-14 days PT Treatment/Interventions: Balance/vestibular training;Community reintegration;Discharge planning;Cognitive remediation/compensation;Functional mobility training;Neuromuscular re-education;DME/adaptive equipment instruction;Disease management/prevention;Pain management;Patient/family education;Therapeutic Activities;Therapeutic Exercise;Psychosocial support;Skin care/wound management;UE/LE Strength taining/ROM;Splinting/orthotics;UE/LE Coordination activities;Wheelchair propulsion/positioning;Visual/perceptual remediation/compensation PT Transfers Anticipated Outcome(s): Min A  PT Locomotion Anticipated Outcome(s): supervision w/c mobility only (bilateral BKA) - in home environment PT Recommendation Follow Up Recommendations: Home health PT Patient destination: Home Equipment Recommended: To be determined Equipment Details: family has commode, slide board, w/c (manual and motorized loaner) from previous admission   Skilled Therapeutic Intervention  Session 1: Pt supine in bed upon PT arrival, agreeable to therapy  tx and reports pain with movement in R LE. This therapist reassessed strength, ROM and functional mobility. Pt transferred from supine<>sitting with mod assist and increased time to complete,verbal cues for techniques and manual facilitation for anterior weightshift. Pt transferred from bed>w/c using a slideboard and lateral scoot with max assist and manual facilitation for weightshifting. Therapist made adjustments to w/c, added amputee support pads and cushion for pressure relief. Pt propelled w/c x 50 ft  using B UEs part way to the gym, increased time to complete and limited by fatigue. Once pt arrived in the gym, therapist re-wrapped R LE while engaging in conversation/pt education. Pt suddenly became lethargic and unable to stay alert. Pt also with increased SOB. Pt transported back to room for vitals to be taken. BP not reading on dynamap, SpO2 dropped to 78%. Pt transferred back to bed with total assist, slideboard. RN present and checked manual BP 70/40. Pt left supine in bed in the care of RN and wife present.  Session 2: Pt missed 75 minute this session secondary pain 9/10 and fatigue. Pain meds on hold due to low blood pressure. Therapist suggested bed level exercises and UE exercises, pt declined.   PT Evaluation Precautions/Restrictions Precautions Precautions: Fall Restrictions Weight Bearing Restrictions: Yes RLE Weight Bearing: Non weight bearing RLE Partial Weight Bearing Percentage or Pounds: R BKA LLE Weight Bearing: Non weight bearing LLE Partial Weight Bearing Percentage or Pounds: L BKA Pain Pain Assessment Pain Assessment: No/denies pain Home Living/Prior Functioning Home Living Living Arrangements: Spouse/significant other Available Help at Discharge: Family;Available 24 hours/day Type of Home: Mobile home Home Access: Ramped entrance Home Layout: One level Bathroom Shower/Tub: Walk-in shower;Door [employer] Toilet: Standard  Lives With: Spouse Prior Function Level of Independence: Independent with transfers;Needs assistance with tranfers;Needs assistance with ADLs;Needs assistance with homemaking;Requires assistive device for independence(Independent w/ some transfers, needs assistance for uneven slide board transfers)  Able to Take Stairs?: No Driving: No 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 Praxis: Intact  Cognition Overall Cognitive Status: Within Functional Limits for tasks  assessed Arousal/Alertness: Awake/alert Orientation Level: Oriented X4 Attention: Focused Focused Attention: Appears intact Sustained Attention: Appears intact Memory: Appears intact Awareness: Appears intact Problem Solving: Appears intact Safety/Judgment: Appears intact Sensation Sensation Light Touch: Impaired by gross assessment Light Touch Impaired Details: Impaired RLE;Impaired LLE(impaired R>L along residual limb) Proprioception: Appears Intact Coordination Gross Motor Movements are Fluid and Coordinated: Yes Fine Motor Movements are Fluid and Coordinated: Yes Motor  Motor Motor: Other (comment) Motor - Skilled Clinical Observations: generalized weakness, bilateral BKAs  Mobility Bed Mobility Bed Mobility: Rolling Right;Rolling Left;Supine to Sit;Sit to Supine Rolling Right: 3: Mod assist Rolling Right Details: Manual facilitation for weight shifting;Manual facilitation for placement;Manual facilitation for weight bearing;Verbal cues for technique Rolling Left: 3: Mod assist Rolling Left Details: Verbal cues for technique;Manual facilitation for weight shifting;Manual facilitation for placement;Manual facilitation for weight bearing Supine to Sit: 3: Mod assist Supine to Sit Details: Manual facilitation for placement;Manual facilitation for weight shifting;Manual facilitation for weight bearing;Verbal cues for technique Sit to Supine: 3: Mod assist Sit to Supine - Details: Manual facilitation for weight bearing;Manual facilitation for weight shifting;Manual facilitation for placement;Verbal cues for technique Transfers Transfers: Yes Lateral/Scoot Transfers: 2: Max assist Lateral/Scoot Transfer Details: Manual facilitation for weight bearing;Manual facilitation for weight shifting;Manual facilitation for placement;Verbal cues for technique;Verbal cues for safe use of DME/AE Lateral/Scoot Transfer Details (indicate cue type and reason): Mod A downhill, Max A uphill   Locomotion  Ambulation  Ambulation: No Gait Gait: No Stairs / Additional Locomotion Stairs: No Architect: Yes Wheelchair Assistance: 5: Investment banker, operational Details: Verbal cues for Marketing executive: Both upper extremities Wheelchair Parts Management: Needs assistance Distance: 40'  Trunk/Postural Assessment  Cervical Assessment Cervical Assessment: Exceptions to WFL(forward head, rounded shoulder posture) Thoracic Assessment Thoracic Assessment: Within Functional Limits Lumbar Assessment Lumbar Assessment: Exceptions to WFL(posterior pelvic tilt) Postural Control Postural Control: Deficits on evaluation(posterior lean in sitting)  Balance Balance Balance Assessed: Yes Static Sitting Balance Static Sitting - Balance Support: Right upper extremity supported Static Sitting - Level of Assistance: 4: Min assist Dynamic Sitting Balance Dynamic Sitting - Balance Support: During functional activity;No upper extremity supported Dynamic Sitting - Level of Assistance: 3: Mod assist Sitting balance - Comments: posterior lean, increased difficulty returning to upright Extremity Assessment  RLE Assessment RLE Assessment: Exceptions to [facility] RLE Strength RLE Overall Strength Comments: R BKA, 4/5 hip ms, unable to test knee ms 2/2 pain LLE Assessment LLE Assessment: Exceptions to [facility] LLE Strength LLE Overall Strength Comments: L BKA, 4/5 hip ms, unable to test knee ms 2/2 pain   See Function Navigator for Current Functional Status.   Refer to Care Plan for Long Term Goals  Recommendations for other services: None   Discharge Criteria: Patient will be discharged from PT if patient refuses treatment 3 consecutive times without medical reason, if treatment goals not met, if there is a change in medical status, if patient makes no progress towards goals or if patient is discharged from hospital.  The above assessment,  treatment plan, treatment alternatives and goals were discussed and mutually agreed upon: by patient and by family     Netta Corrigan, PT, DPT 03/15/2017, 8:00 AM

## 2017-03-16 ENCOUNTER — Other Ambulatory Visit: Payer: Self-pay

## 2017-03-16 ENCOUNTER — Inpatient Hospital Stay (HOSPITAL_COMMUNITY): Payer: Medicare Other | Admitting: Physical Therapy

## 2017-03-16 ENCOUNTER — Inpatient Hospital Stay (HOSPITAL_COMMUNITY): Payer: Medicare Other | Admitting: Occupational Therapy

## 2017-03-16 ENCOUNTER — Encounter (HOSPITAL_COMMUNITY): Payer: Self-pay

## 2017-03-16 DIAGNOSIS — E1142 Type 2 diabetes mellitus with diabetic polyneuropathy: Secondary | ICD-10-CM

## 2017-03-16 DIAGNOSIS — I953 Hypotension of hemodialysis: Secondary | ICD-10-CM

## 2017-03-16 DIAGNOSIS — I4891 Unspecified atrial fibrillation: Secondary | ICD-10-CM

## 2017-03-16 LAB — GLUCOSE, CAPILLARY
GLUCOSE-CAPILLARY: 49 mg/dL — AB (ref 65–99)
GLUCOSE-CAPILLARY: 43 mg/dL — AB (ref 65–99)
GLUCOSE-CAPILLARY: 54 mg/dL — AB (ref 65–99)
Glucose-Capillary: 149 mg/dL — ABNORMAL HIGH (ref 65–99)
Glucose-Capillary: 88 mg/dL (ref 65–99)
Glucose-Capillary: 55 mg/dL — ABNORMAL LOW (ref 65–99)
Glucose-Capillary: 190 mg/dL — ABNORMAL HIGH (ref 65–99)
Glucose-Capillary: 305 mg/dL — ABNORMAL HIGH (ref 65–99)

## 2017-03-16 MED ORDER — DEXTROSE 50 % IV SOLN
0.5000 | Freq: Once | INTRAVENOUS | Status: AC
  Administered 2017-03-16: 25 mL via INTRAVENOUS

## 2017-03-16 MED ORDER — CHLORHEXIDINE GLUCONATE 0.12 % MT SOLN
15.0000 mL | Freq: Two times a day (BID) | OROMUCOSAL | Status: DC
  Administered 2017-03-16 – 2017-03-28 (×20): 15 mL via OROMUCOSAL
  Filled 2017-03-16 (×20): qty 15

## 2017-03-16 MED ORDER — GLUCOSE 40 % PO GEL
ORAL | Status: AC
  Administered 2017-03-16: 37.5 g
  Filled 2017-03-16: qty 1

## 2017-03-16 MED ORDER — INSULIN NPH (HUMAN) (ISOPHANE) 100 UNIT/ML ~~LOC~~ SUSP
45.0000 [IU] | Freq: Every day | SUBCUTANEOUS | Status: DC
  Administered 2017-03-16 – 2017-03-18 (×3): 45 [IU] via SUBCUTANEOUS
  Filled 2017-03-16: qty 10

## 2017-03-16 MED ORDER — DARBEPOETIN ALFA 100 MCG/0.5ML IJ SOSY: 100.0000 ug | PREFILLED_SYRINGE | INTRAMUSCULAR | Status: DC

## 2017-03-16 MED ORDER — DARBEPOETIN ALFA 100 MCG/0.5ML IJ SOSY
100.0000 ug | PREFILLED_SYRINGE | INTRAMUSCULAR | Status: DC
  Filled 2017-03-16: qty 0.5

## 2017-03-16 MED ORDER — INSULIN NPH (HUMAN) (ISOPHANE) 100 UNIT/ML ~~LOC~~ SUSP
55.0000 [IU] | Freq: Every day | SUBCUTANEOUS | Status: DC
  Administered 2017-03-17 – 2017-03-21 (×5): 55 [IU] via SUBCUTANEOUS
  Filled 2017-03-16: qty 10

## 2017-03-16 MED ORDER — GLUCOSE 40 % PO GEL
ORAL | Status: AC
  Administered 2017-03-16: 17:00:00
  Filled 2017-03-16: qty 1

## 2017-03-16 MED ORDER — DEXTROSE 50 % IV SOLN
INTRAVENOUS | Status: AC
  Filled 2017-03-16: qty 50

## 2017-03-16 MED ORDER — NEPRO/CARBSTEADY PO LIQD
237.0000 mL | Freq: Two times a day (BID) | ORAL | Status: DC
  Administered 2017-03-17 – 2017-03-25 (×10): 237 mL via ORAL

## 2017-03-16 NOTE — Progress Notes (Signed)
[facility] Progress Note   Assessment/ Plan:   1. SOB - likely due to vol overload, improved.  Changed formulation to 1.5% and 2.5% mixture last night due to orthostasis.  All 2.5% mixture tonight 2. DM2- glucoses improving and even had some hypoglycemia yesterday 3. Afib with RVR - per primary.  4. PAD sp revision L BKA and new R BKA on 11/21 5. [place].  6. Hypokalemia - Closely following K, on supplementation. 7. HTN/ vol - coreg 6.25mg  hs. 8. MBD of CKD - holding phoslo for lowish/ controlled  Phos = 3.6/3.7 ;  Correc Ca= 8.9  No vit D 9. UTI - blood cx's neg, no urine cx in system. Sp short course abx. 10. Malnutrition - alb 1.3   Prostat, +Nepro.  11. Anemia of CKD - getting darbe 150/wk here, Hb 7-9 range    Subjective:    Was orthostatic yesterday in the gym, given 250 cc bolus, changed PD to 1.5% and 2.5% mixture.  BG down as well, lowest 52.   Objective:   BP (!) 127/56 (BP Location: Right Arm)   Pulse 80   Temp 98.5 F (36.9 C) (Axillary)   Resp 18   Wt 68 kg (149 lb 14.6 oz)   SpO2 100%   BMI 22.14 kg/m   Physical Exam: General:  Alert ,NAD, OX3 appropriate  Heart: RRR; No m, r, g Lungs: CTA bilat anteriorly, diminished at the bases. Abdomen: soft, non-tender., non distended, PD cath in place. Extremities: L BKA and new R BKA.  Edema much better Dialysis Access: PD cath, L AVF + thrill, bruit not developed well     Labs: BMET Recent Labs  Lab 03/10/17 1203 03/11/17 0730 03/12/17 1429 03/13/17 0516 03/13/17 0951 03/14/17 0603 03/15/17 0643  NA 130*  --  136 132*  --  135 132*  K 2.6*  --  2.3* 3.0*  --  4.2 3.6  CL 93*  --  99* 94*  --  96* 94*  CO2 26  --  29 27  --  25 26  GLUCOSE 328* 607* 141* 624* 750* 328* 501*  BUN 49*  --  52* 49*  --  49* 44*  CREATININE 5.06*  --  5.03* 5.22*  --  5.37* 5.55*  CALCIUM 7.8*  --  8.0* 8.2*  --  8.7* 8.7*  PHOS 3.8  --   --   --   --   --   --    CBC Recent  Labs  Lab 03/12/17 1429 03/13/17 0516 03/14/17 0641 03/15/17 0643  WBC 13.1* 9.6 10.2 8.9  NEUTROABS  --   --   --  6.1  HGB 7.9* 9.0* 9.7* 9.2*  HCT 25.2* 28.7* 31.1* 29.6*  MCV 93.7 95.3 94.5 96.1  PLT 416* 457* 462* 492*    @IMGRELPRIORS @ Medications:    . aspirin EC  81 mg Oral Daily  . atorvastatin  80 mg Oral q1800  . calcium acetate  667 mg Oral TID WC  . carvedilol  12.5 mg Oral BID WC  . chlorhexidine  15 mL Mouth/Throat BID  . clopidogrel  75 mg Oral Daily  . docusate sodium  100 mg Oral BID  . feeding supplement (PRO-STAT SUGAR FREE 64)  30 mL Oral BID  . gentamicin cream  1 application Topical Daily  . insulin aspart  0-15 Units Subcutaneous TID WC  . insulin NPH Human  50 Units Subcutaneous QHS  . insulin  NPH Human  65 Units Subcutaneous QAC breakfast  . multivitamin  1 tablet Oral QHS  . polyethylene glycol  17 g Oral Daily  . potassium chloride  30 mEq Oral Daily  . protein supplement shake  2 oz Oral QID  . [START ON 03/30/2017] Vitamin D (Ergocalciferol)  50,000 Units Oral Q30 days     Bufford ButtnerElizabeth Laporsha Grealish MD [facility] pgr [phone] 03/16/2017, 9:43 AM

## 2017-03-16 NOTE — Progress Notes (Signed)
Physical Therapy Session Note  Patient Details  Name: [name] MRN: [id number] Date of Birth: June 27, 1940  Today's Date: 03/16/2017 PT Individual Time: 1100-1200 AND 1600-1630 PT Individual Time Calculation (min): 60 min AND 30 min   Short Term Goals: Week 1:  PT Short Term Goal 1 (Week 1): Pt will perform bed mobility w/ Min A  PT Short Term Goal 2 (Week 1): Pt will transfer bed<>chair w/ Mod A  PT Short Term Goal 3 (Week 1): Pt will self-propel w/c 21' w/ supervision PT Short Term Goal 4 (Week 1): Pt will maintain static sitting balance w/ supervision   Skilled Therapeutic Interventions/Progress Updates:   Session 1:  Pt supine upon arrival and agreeable to therapy, no c/o pain. Worked on tolerance to OOB activity this session and functional strength. Transferred to EOB w/ Mod assist and to motorized w/c w/ max assist via slide board transfer. Pt transported to/from gym using motorized w/c w/ supervision, ~150' each way and verbal cues for steering. Transferred to/from mat w/ max assist via same technique. Increased time for transfers this session 2/2 fatigue and decreased strength of UEs for scooting during transfer. Verbal, visual, and manual cues for technique and taking advantage of head/hips relationship for weight shifting. Worked on UE strengthening while seated edge of mat w/ emphasis on anterior weight shifting for posterior scooting. Returned to w/c and room in motorized w/c. Ended session tilted back in w/c for pressure relief and in care of wife, all needs met.   Session 2: Pt in w/c upon arrival and agreeable to therapy, c/o feeling a "knot" in medial aspect of R residual limb. Removed bandages to note some dried blood bleeding through. Notified RN who came to visualize wound, this PT rewrapped limb w/ appropriate dressings per RN instructions. Worked on UE strengthening this session while performing pressure relief techniques in chair utilizing armrests, tossing ball w/ 1#  weighted dowel rod, and instructing pt on tricep strengthening exercises using green TB while seated. Ended session in w/c and in care of RN, all needs met.   Therapy Documentation Precautions:  Restrictions Weight Bearing Restrictions: Yes RLE Weight Bearing: Non weight bearing RLE Partial Weight Bearing Percentage or Pounds: BKA LLE Weight Bearing: Non weight bearing LLE Partial Weight Bearing Percentage or Pounds: (BKA) Vital Signs: Therapy Vitals Temp: 97.9 F (36.6 C) Temp Source: Oral Pulse Rate: 77 Resp: 18 BP: (!) 91/47 Patient Position (if appropriate): Sitting Oxygen Therapy SpO2: 91 % O2 Device: Not Delivered  See Function Navigator for Current Functional Status.   Therapy/Group: Individual Therapy  [name] 03/16/2017, 5:14 PM

## 2017-03-16 NOTE — Plan of Care (Signed)
Monitoring wounds, blackened area Right stump Dialysis decreased urine production Continent of bowel Pain managed with current plan of care

## 2017-03-16 NOTE — Progress Notes (Addendum)
Initial Nutrition Assessment  DOCUMENTATION CODES:   Not applicable  INTERVENTION:  Provide 30 ml Prostat po once daily, each supplement provides 100 kcal and 15 grams of protein.   Discontinue Premier Protein.   Provide Nepro Shake po BID, each supplement provides 425 kcal and 19 grams protein.  Encourage adequate PO intake.   NUTRITION DIAGNOSIS:   Increased nutrient needs related to chronic illness(ESRD) as evidenced by estimated needs.  GOAL:   Patient will meet greater than or equal to 90% of their needs  MONITOR:   PO intake, Supplement acceptance, Labs, Diet advancement, Weight trends, I & O's, Skin  REASON FOR ASSESSMENT:   Malnutrition Screening Tool    ASSESSMENT:   76 y.o. 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 Pt underwent right BKA and revision of the left BKA. Patient stabilized felt the patient could return back to inpatient rehabilitation services and was readmitted for a comprehensive rehabilitation program  Pt was unavailable during time of visit. RD unable to obtain nutrition history. Pt with weight loss since rehab admission, however likely related to fluid status. Noted pt with ESRD on PD. Meal completion has been 50-100% with 50% at lunch today. Pt currently has Prostat and Premier Protein ordered and has been consuming them. RD to modify orders and order Nepro shake instead of Premier protein as Nepro is more kidney friendly.   Unable to complete Nutrition-Focused physical exam at this time. RD to perform physical exam at next visit.   Labs and medications reviewed.  Diet Order:  Diet renal/carb modified with fluid restriction Diet-HS Snack? Nothing; Room service appropriate? Yes; Fluid consistency:  Thin  EDUCATION NEEDS:   No education needs have been identified at this time  Skin:  Skin Assessment: Skin Integrity Issues: Skin Integrity Issues:: (Stage I to buttocks)  Last BM:  12/6  Height:   Ht Readings from Last 1 Encounters:  03/12/17 5\' 9"  (1.753 m)    Weight:   Wt Readings from Last 1 Encounters:  03/15/17 149 lb 14.6 oz (68 kg)    Ideal Body Weight:  63.3 kg(adjusted for bilat BKA)  BMI:  Body mass index is 22.14 kg/m.  Estimated Nutritional Needs:   Kcal:  1850-2100  Protein:  90-100 grams  Fluid:  Per MD    Roslyn SmilingStephanie Devota Viruet, MS, RD, LDN Pager # [phone] After hours/ weekend pager # [phone]

## 2017-03-16 NOTE — Plan of Care (Signed)
Pt's pain controlled Pt receives dialysis Wounds healing to BLE

## 2017-03-16 NOTE — Progress Notes (Signed)
Hypoglycemic Event  CBG: 49  Treatment: 15 GM gel  Symptoms: None  Follow-up CBG: Time:1700 CBG Result:43  Possible Reasons for Event: Medication regimen: NPH, peritoneal dialysis. poor appetite  Comments/MD notified:Pam Love, PA  Changes made to medication regimen.   Patient had tube of glucose gel, premier protein, ate 1/2 of his hamburger, and vanilla pudding and his blood sugar only came up to 55 (at 1747).  Gave patient another tube of glucose gel and rechecked blood sugar at 1828 for a result of 54.  Called Deatra Inaan Angiulli, PA for advice.  Patient very brittle diabetic.  He recommended giving him 0.5 of an amp of D50 and monitor blood sugars until he gets to a normal range.  Administered medication via IV.  Patient still with no symptoms of hypoglycemia.  Will continue to monitor blood sugar.     [name], [name]

## 2017-03-16 NOTE — Evaluation (Addendum)
Updated Occupational Therapy Assessment and Plan due to Interrupted Stay   Patient Details  Name: [name] MRN: [id number] Date of Birth: 1940/10/29  OT Diagnosis: muscle weakness (generalized) Rehab Potential: Rehab Potential (ACUTE ONLY): Good ELOS: 14-17 days    Today's Date: 03/16/2017 OT Individual Time:0900-0957 and 1400-1430 OT Individual Time Calculation (min): 57 min and 30 min     Problem List:  Patient Active Problem List   Diagnosis Date Noted  . Type 2 diabetes mellitus with peripheral neuropathy (HCC)   . New onset atrial fibrillation ([place])   . Confusion 03/12/2017  . Loose stools   . Essential hypertension   . Acute combined systolic and diastolic congestive heart failure ([place])   . Poorly controlled type 2 diabetes mellitus with peripheral neuropathy ([place])   . S/P bilateral below knee amputation ([place]) 03/06/2017  . Constipation due to pain medication   . Coronary artery disease involving native coronary artery of native heart without angina pectoris   . Hyperlipidemia   . S/P unilateral BKA (below knee amputation), right ([place])   . SIRS (systemic inflammatory response syndrome) ([place]) 03/05/2017  . S/P bilateral BKA (below knee amputation) ([place]) 03/01/2017  . Gangrene of right foot ([place]) 02/23/2017  . Blood in stool   . Wound dehiscence   . Rupture of operation wound   . Toe amputation status, right ([place])   . Pressure injury of head, stage 3 ([place])   . Poor nutrition   . Brittle diabetes ([place])   . Adjustment disorder with depressed mood   . Hypoglycemia   . Ischemic pain of right foot   . Arterial hypotension   . Diabetes mellitus type 2 in obese ([place])   . PVD (peripheral vascular disease) ([place])   . Delirium   . History of left below knee amputation ([place]) 01/17/2017  . Acute blood loss anemia   . Elevated AST (SGOT) 01/09/2017  . Chest pain 01/09/2017  . NSTEMI (non-ST elevated myocardial infarction) ([place]) 01/09/2017  . Hypokalemia 01/07/2017  .  Hypomagnesemia 01/07/2017  . Left foot infection 01/06/2017  . Chronic hypotension 01/06/2017  . 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 IDDM (insulin dependent diabetes mellitus) ([place]) 01/06/2017  . Foot infection 01/06/2017  . Labile blood pressure   . Hallucination   . Benign essential HTN   . Ulcer of toe of right foot ([place])   . Ulcer of external ear, limited to breakdown of skin ([place])   . Chronic congestive heart failure ([place])   . Anemia of chronic disease   . Labile blood glucose   . Uncontrolled diabetes mellitus type 2 with peripheral artery disease ([place])   . Diabetic peripheral neuropathy ([place])   . Debility 12/27/2016  . History of transmetatarsal amputation of left foot ([place]) 12/27/2016  . Constipation   . Chronic obstructive pulmonary disease ([place])   . Nausea   . ESRD on dialysis ([place])   . Leukocytosis   . Tachypnea   . FUO (fever of unknown origin)   . Encephalopathy 12/23/2016  . Sore throat 12/23/2016  . Gangrene of left foot ([place]) 11/26/2016  . PAD (peripheral artery disease) ([place]) 11/26/2016  . Sjogren's syndrome ([place]) 09/26/2015  . Uncontrolled type 2 diabetes mellitus with hyperglycemia, with long-term current use of insulin ([place]) 09/26/2015  . Hyperosmolar non-ketotic state in patient with type 2 diabetes mellitus ([place]) 09/25/2015  . Ileus ([place]) 09/22/2015  . Acute respiratory failure with hypoxia ([place]) 09/22/2015  . Acute gouty arthritis 09/22/2015  .  CAD (coronary artery disease) of artery bypass graft 09/22/2015  . Sepsis ([place]) 09/21/2015  . Elevated troponin level 09/21/2015  . ESRD on peritoneal dialysis ([place]) 09/21/2015  . CVA (cerebral infarction) 09/21/2015    Past Medical History:  Past Medical History:  Diagnosis Date  . AICD (automatic cardioverter/defibrillator) present    [employer] [phone] lead [id number]  . CHF (congestive heart failure) ([place])   . Complication of anesthesia   . COPD (chronic  obstructive pulmonary disease) ([place])   . Coronary artery disease    CABG 2011  . Diabetes mellitus without complication ([place])   . ESRD on peritoneal dialysis [facility] [place])    Started PD Dec 2016 in [place]. Nephrology is in [place].   . Gangrene ([place])    left knee  . GERD (gastroesophageal reflux disease)   . History of peritonitis    PD cath related peritonitis in April 2017  . Hypertension   . Peripheral vascular disease ([place])   . PONV (postoperative nausea and vomiting)   . Sjogren's syndrome ([place])    Per pt's wife, was diagnosed in 2016 during w/u for cause of renal failure prior to starting dialysis.  He had a rash on his chest apparently prompting this w/u.  Never had renal bx.  He was referred to a rheum MD in [place] and per the wife he was treated with MTX and other medications but had side effects to "all of it" and isn't taking anything for this as of Jun 2017.   [name] Sleep apnea    wears Bipap  . Stroke ([place])   . Wound dehiscence    Right foot   Past Surgical History:  Past Surgical History:  Procedure Laterality Date  . ABDOMINAL AORTOGRAM W/LOWER EXTREMITY N/A 11/29/2016   Procedure: ABDOMINAL AORTOGRAM W/LOWER EXTREMITY;  Surgeon: Serafina Mitchell, MD;  Location: [facility];  Service: Cardiovascular;  Laterality: N/A;  . AMPUTATION Left 11/29/2016   Procedure: LEFT THIRD TOE AMPUTATION;  Surgeon: Serafina Mitchell, MD;  Location: [facility] OR;  Service: Vascular;  Laterality: Left;  . AMPUTATION Left 12/23/2016   Procedure: Left Transmetatarsal Amputation;  Surgeon: Newt Minion, MD;  Location: [facility];  Service: Orthopedics;  Laterality: Left;  . AMPUTATION Left 01/11/2017   Procedure: LEFT BELOW KNEE AMPUTATION;  Surgeon: Newt Minion, MD;  Location: [facility];  Service: Orthopedics;  Laterality: Left;  . AMPUTATION Right 01/20/2017   Procedure: AMPUTATION RIGHT FIFTH TOE;  Surgeon: Serafina Mitchell, MD;  Location: [facility];  Service: Vascular;  Laterality: Right;  . AMPUTATION  Right 03/01/2017   Procedure: RIGHT BELOW KNEE AMPUTATION;  Surgeon: Newt Minion, MD;  Location: [facility];  Service: Orthopedics;  Laterality: Right;  . ANGIOPLASTY  12/15/2016   Procedure: BALLOON ANGIOPLASTY;  Surgeon: Waynetta Sandy, MD;  Location: [facility];  Service: Vascular;;  . cardiac catherization with stent placement  2017  . COLONOSCOPY W/ BIOPSIES AND POLYPECTOMY    . CORONARY ARTERY BYPASS GRAFT  2011  . FALSE ANEURYSM REPAIR Right 12/15/2016   Procedure: REPAIR FALSE ANEURYSM;  Surgeon: Waynetta Sandy, MD;  Location: [facility];  Service: Vascular;  Laterality: Right;  . IRRIGATION AND DEBRIDEMENT FOOT Left 12/15/2016   Procedure: IRRIGATION AND DEBRIDEMENT FOOT;  Surgeon: Waynetta Sandy, MD;  Location: [facility];  Service: Vascular;  Laterality: Left;  . LEFT HEART CATH AND CORS/GRAFTS ANGIOGRAPHY N/A 01/09/2017   Procedure: LEFT HEART CATH AND CORS/GRAFTS ANGIOGRAPHY;  Surgeon: Lorretta Harp, MD;  Location: [facility];  Service: Cardiovascular;  Laterality: N/A;  . LOWER EXTREMITY ANGIOGRAM Left 12/15/2016   Procedure: LOWER EXTREMITY ANGIOGRAM; PEDAL ACCESS;  Surgeon: Waynetta Sandy, MD;  Location: [facility];  Service: Vascular;  Laterality: Left;  . PERIPHERAL VASCULAR BALLOON ANGIOPLASTY  11/29/2016   Procedure: PERIPHERAL VASCULAR BALLOON ANGIOPLASTY;  Surgeon: Serafina Mitchell, MD;  Location: [facility];  Service: Cardiovascular;;  LT Peroneal AT attempted unsuccessful  . 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 Pt has been limited by pain, surgeries, and vacs. Wound vacs were later discontinued 03/05/2017 per Dr. Sharol Given. CCPD followed by renal services. Troponin mildly elevated 0.17 felt to be demand ischemia and monitored. Acute on chronic anemia and monitored. Bouts of hypokalemia with supplement added. Patient with low-grade fever and presently maintained on Zosyn with blood cultures negative and urine study pending. PM&R was asked to see the patient regarding potential for inpatient rehab admission. Patient was admitted for a comprehensive rehabilitation program. Patient transferred to CIR on 03/06/2017 .  Interrupted stay with Pt transferring back to CIR on 03/14/2017.   Patient currently requires max with basic self-care skills secondary to muscle weakness, decreased cardiorespiratoy endurance and decreased sitting balance and decreased postural control.  Prior to hospitalization, patient could complete BADLs with min.  Patient will benefit from skilled intervention to increase independence with basic self-care skills prior to discharge home with care partner.  Anticipate patient will require minimal physical assistance and follow up home health.   OT - End of Session Activity Tolerance: Tolerates 10 - 20 min activity with multiple rests Endurance Deficit: Yes Endurance Deficit Description: fatigues quickly and moderate increase in work of breathing w/ most activity  OT Assessment Rehab Potential (ACUTE ONLY): Good OT Patient demonstrates impairments in the following area(s): Balance;Cognition;Endurance;Motor;Safety;Skin  Integrity OT Basic ADL's Functional Problem(s): Grooming;Bathing;Dressing;Toileting OT Transfers Functional Problem(s): Toilet OT Additional Impairment(s): None OT Plan OT Intensity: Minimum of 1-2 x/day, 45 to 90 minutes OT Frequency: 5 out of 7 days OT Duration/Estimated Length of Stay: 14-17 days  OT Treatment/Interventions: Balance/vestibular training;Discharge planning;Disease Lawyer;Functional mobility training;Pain management;Patient/family education;Psychosocial support;Self Care/advanced ADL retraining;Skin care/wound managment;Therapeutic Activities;Therapeutic Exercise;UE/LE Strength taining/ROM;Wheelchair propulsion/positioning OT Self Feeding Anticipated Outcome(s): no goal OT Basic Self-Care Anticipated Outcome(s): MinA OT Toileting Anticipated Outcome(s): MinA OT Bathroom Transfers Anticipated Outcome(s): MinA OT Recommendation Patient destination: Home Follow Up Recommendations: Home health OT Equipment Recommended: None recommended by OT   Skilled Therapeutic Intervention Session 1: Pt received supine in bed, agreeable to OT tx session, however still connected to PD machine. Pt completed bathing/dressing supine in bed and seated EOB. Pt doffs overhead shirt while supine in bed with  modA, washes UB with setup/supervision. Pt completes bed mobility with MaxA to sit EOB for donning overhead shirt. Pt with posterior lean, requires MaxA to maintain sitting balance while donning shirt. Pt returns to supine in bed for LB bathing/dressing. Pt washes upper LEs with setup/supervision, dons pants with MaxA, requires ModA for rolling to L and MaxA for rolling to R. Pt left supine in bed, RN present do disconnect PD machine, needs within reach.   Session 2 with focus on UE strengthening and endurance to increase ease of completing functional transfers. Pt presented sitting up in w/c. Pt completes single and bil UE there ex using 1 lb dowel  rod and 3lb dumbbell including shoulder flexion, elbow flex/extension, trunk rotation and forward flexion, forward/backward rows. Pt completing each motion for 5 reps x2 sets with rest breaks throughout. Pt left seated in w/c, needs within reach, spouse present.   OT Evaluation Precautions/Restrictions   NWB on B limbs, falls Pain Pain Assessment Pain Assessment: No/denies pain Home Living/Prior Functioning Home Living Family/patient expects to be discharged to:: Private residence Living Arrangements: Spouse/significant other Available Help at Discharge: Family, Available 24 hours/day Type of Home: Mobile home Home Access: Ramped entrance Home Layout: One level Bathroom Shower/Tub: Gaffer, Charity fundraiser: Standard  Lives With: Spouse Prior Function Level of Independence: Independent with transfers, Needs assistance with tranfers, Needs assistance with ADLs, Needs assistance with homemaking, Requires assistive device for independence(Independent w/ some transfers, needs assistance for uneven slide board transfers)  Able to Take Stairs?: No Driving: No Vocation: Retired ADL  refer to functional navigator Vision Baseline Vision/History: Wears glasses Wears Glasses: At all times Patient Visual Report: No change from baseline Vision Assessment?: No apparent visual deficits Perception  Perception: Within Functional Limits Praxis Praxis: Intact Cognition Overall Cognitive Status: Within Functional Limits for tasks assessed Arousal/Alertness: Awake/alert Memory: Appears intact Attention: Focused Focused Attention: Appears intact Sustained Attention: Appears intact Awareness: Appears intact Problem Solving: Appears intact Safety/Judgment: Appears intact Sensation Sensation Light Touch: Impaired by gross assessment Light Touch Impaired Details: Impaired RLE;Impaired LLE(impaired R>L along residual limb) Proprioception: Appears Intact Coordination Gross Motor  Movements are Fluid and Coordinated: Yes Fine Motor Movements are Fluid and Coordinated: Yes Motor  Motor Motor: Other (comment) Motor - Skilled Clinical Observations: generalized weakness, bilateral BKAs Mobility  Bed Mobility Bed Mobility: Rolling Right;Rolling Left;Supine to Sit;Sit to Supine Rolling Right: 4: Max assist Rolling Right Details: Manual facilitation for weight shifting;Manual facilitation for placement;Manual facilitation for weight bearing;Verbal cues for technique Rolling Left: 3: Mod assist Rolling Left Details: Verbal cues for technique;Manual facilitation for weight shifting;Manual facilitation for placement;Manual facilitation for weight bearing Supine to Sit: 4: Max assist Supine to Sit Details: Manual facilitation for placement;Manual facilitation for weight shifting;Manual facilitation for weight bearing;Verbal cues for technique Sit to Supine: 3: Mod assist Sit to Supine - Details: Manual facilitation for weight bearing;Manual facilitation for weight shifting;Manual facilitation for placement;Verbal cues for technique  Trunk/Postural Assessment  Cervical Assessment Cervical Assessment: Exceptions to WFL(forward head, rounded shoulder posture) Thoracic Assessment Thoracic Assessment: Within Functional Limits Lumbar Assessment Lumbar Assessment: Exceptions to WFL(posterior pelvic tilt) Postural Control Postural Control: Deficits on evaluation(posterior lean in sitting)  Balance Balance Balance Assessed: Yes Static Sitting Balance Static Sitting - Balance Support: Right upper extremity supported Static Sitting - Level of Assistance: 3: Mod assist Dynamic Sitting Balance Dynamic Sitting - Balance Support: During functional activity;No upper extremity supported Dynamic Sitting - Level of Assistance: 5: Max assist Sitting balance - Comments: posterior  lean, increased difficulty returning to upright Extremity/Trunk Assessment RUE Assessment RUE Assessment:  Within Functional Limits  - 4/ 5 BUE/generalized weakness  LUE Assessment LUE Assessment: Generalized weakness       See Function Navigator for Current Functional Status.   Refer to Care Plan for Long Term Goals  Recommendations for other services: None    Discharge Criteria: Patient will be discharged from OT if patient refuses treatment 3 consecutive times without medical reason, if treatment goals not met, if there is a change in medical status, if patient makes no progress towards goals or if patient is discharged from hospital.  The above assessment, treatment plan, treatment alternatives and goals were discussed and mutually agreed upon: by patient  Raymondo Band 03/16/2017, 4:07 PM

## 2017-03-16 NOTE — Progress Notes (Signed)
[place] PHYSICAL MEDICINE & REHABILITATION     PROGRESS NOTE  Subjective/Complaints:  Patient seen lying in bed this morning. He states he slept well overnight. Patient was unable to tolerate therapies yesterday due to symptomatic hypotension.  ROS: Denies CP, SOB, nausea, vomiting, diarrhea.  Objective: Vital Signs: Blood pressure (!) 127/56, pulse 80, temperature 98.5 F (36.9 C), temperature source Axillary, resp. rate 18, weight 68 kg (149 lb 14.6 oz), SpO2 100 %. No results found. Recent Labs    03/14/17 0641 03/15/17 0643  WBC 10.2 8.9  HGB 9.7* 9.2*  HCT 31.1* 29.6*  PLT 462* 492*   Recent Labs    03/14/17 0603 03/15/17 0643  NA 135 132*  K 4.2 3.6  CL 96* 94*  GLUCOSE 328* 501*  BUN 49* 44*  CREATININE 5.37* 5.55*  CALCIUM 8.7* 8.7*   CBG (last 3)  Recent Labs    03/15/17 1731 03/15/17 2051 03/16/17 0652  GLUCAP 73 167* 149*    Wt Readings from Last 3 Encounters:  03/15/17 68 kg (149 lb 14.6 oz)  03/14/17 75 kg (165 lb 5.5 oz)  03/12/17 78 kg (171 lb 15.3 oz)    Physical Exam:  BP (!) 127/56 (BP Location: Right Arm)   Pulse 80   Temp 98.5 F (36.9 C) (Axillary)   Resp 18   Wt 68 kg (149 lb 14.6 oz)   SpO2 100%   BMI 22.14 kg/m  Constitutional: He appears well-developed and well-nourished.  HENT:  Head: Normocephalic. Ulcer on bridge of nosehealing Eyes: EOM are normal. No discharge.  Cardiovascular: Regular rate and rhythm. No JVD. Respiratory: Effort normal and breath sounds normal.   GI: Bowel sounds are normal. He exhibits no distension.   Musculoskeletal: He exhibits edemaand tenderness.  Neurological: He is alert and oriented.  Motor: B/l UE 4+/5 proximal to distal RLE: HF 4/5 (pain inhibition) LLE: HF 4/5 (pain inhibition) Skin:  See above Left BKA with incision C/D/I Right BKA with drainage and ischemic changes along posterior flap  Assessment/Plan: 1. 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 Physiatrist is providing close team supervision and 24 hour management of active medical problems listed below. Physiatrist and rehab team continue to assess barriers to discharge/monitor patient progress toward functional and medical goals.  Function:  Bathing Bathing position      Bathing parts      Bathing assist        Upper Body Dressing/Undressing Upper body dressing                    Upper body assist        Lower Body Dressing/Undressing Lower body dressing                                  Lower body assist        Toileting Toileting Toileting activity did not occur: No continent bowel/bladder event        Toileting assist     Transfers Chair/bed transfer   Chair/bed transfer method: Lateral scoot Chair/bed transfer assist level: Maximal assist (Pt 25 - 49%/lift and lower) Chair/bed transfer assistive device: Sliding board     Locomotion Ambulation Ambulation activity did not occur: N/A         Wheelchair   Type: Manual Max wheelchair distance: 50 ft Assist Level:  Supervision or verbal cues  Cognition Comprehension Comprehension assist level: Follows basic conversation/direction with no assist  Expression Expression assist level: Expresses basic needs/ideas: With no assist  Social Interaction Social Interaction assist level: Interacts appropriately 75 - 89% of the time - Needs redirection for appropriate language or to initiate interaction.  Problem Solving Problem solving assist level: Solves basic problems with no assist  Memory Memory assist level: Recognizes or recalls 75 - 89% of the time/requires cueing 10 - 24% of the time    Medical Problem List and Plan: 1. 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 Bilateral wound vacs discontinued 03/05/2017 per [name]   Continue CIR 2. DVT  Prophylaxis/Anticoagulation: Will discuss prophylaxis 3. Pain Management: Flexeril and Oxycodone as needed 4. Mood: Provide emotional support 5. Neuropsych: This patient is capable of making decisions on his own behalf. 6. Skin/Wound Care: Routine skin checks 7. Fluids/Electrolytes/Nutrition: Routine I&O's 8. Acute on chronic anemia. Continue Aranesp   Hemoglobin 9.2 on 12/5   Cont to monitor 9. End-stage renal disease/CPPD. Follow-up renal services 10. Diabetes mellitus peripheral neuropathy. Latest hemoglobin A1c 8.8. Home regimen adjusted due to some hyperglycemia with NPH insulin 65 units daily at breakfast and 50 units daily at bedtime. Check blood sugars before meals and at bedtime.    Extremely labile with hypotension overnight.? Stabilizing 11. CAD with CABG. Continue Plavix and aspirin. No chest pain or shortness of breath 12. Diastolic congestive heart failure. Monitor for any signs of fluid overload Filed Weights   03/14/17 1843 03/15/17 0707 03/15/17 0945  Weight: 72 kg (158 lb 11.7 oz) 73 kg (160 lb 15 oz) 68 kg (149 lb 14.6 oz)  13. SIRS. Resolved.   Antibiotics completed. Blood cultures no growth. Monitor for any fever  14. Hypertension. Coreg 12.5 mg twice daily    Symptomatic hypotension yesterday limiting therapies, controlled this a.m.   Will consider orthostatics of persistent 15. New onset atrial fibrillation. Follow-up cardiology services. Coreg 12.5 mg twice daily 16. Hyperlipidemia. Lipitor 17. Constipation. Laxative assistance  LOS (Days) 2 A FACE TO FACE EVALUATION WAS PERFORMED  Ankit Karis Jubanil Patel 03/16/2017 9:14 AM

## 2017-03-16 NOTE — Progress Notes (Signed)
PD treatment completed overnight.  No issues per patient report.  UF 857 mL.  Unable to obtain post treatment weight due to bed malfunction.  Facilities at bedside to assess problem.  PD catheter capped, clamped, and secured in belt.  Will continue to monitor.

## 2017-03-17 ENCOUNTER — Inpatient Hospital Stay (HOSPITAL_COMMUNITY): Payer: Medicare Other | Admitting: Physical Therapy

## 2017-03-17 ENCOUNTER — Encounter: Payer: Medicare Other | Admitting: Physical Medicine & Rehabilitation

## 2017-03-17 ENCOUNTER — Encounter (HOSPITAL_COMMUNITY): Payer: Self-pay

## 2017-03-17 ENCOUNTER — Inpatient Hospital Stay (HOSPITAL_COMMUNITY): Payer: Medicare Other | Admitting: Occupational Therapy

## 2017-03-17 DIAGNOSIS — E162 Hypoglycemia, unspecified: Secondary | ICD-10-CM

## 2017-03-17 DIAGNOSIS — R7309 Other abnormal glucose: Secondary | ICD-10-CM

## 2017-03-17 LAB — CULTURE, BLOOD (ROUTINE X 2)
CULTURE: NO GROWTH
CULTURE: NO GROWTH
SPECIAL REQUESTS: ADEQUATE
SPECIAL REQUESTS: ADEQUATE

## 2017-03-17 LAB — GLUCOSE, CAPILLARY
GLUCOSE-CAPILLARY: 187 mg/dL — AB (ref 65–99)
Glucose-Capillary: 380 mg/dL — ABNORMAL HIGH (ref 65–99)
Glucose-Capillary: 235 mg/dL — ABNORMAL HIGH (ref 65–99)
Glucose-Capillary: 211 mg/dL — ABNORMAL HIGH (ref 65–99)

## 2017-03-17 MED ORDER — INSULIN ASPART 100 UNIT/ML ~~LOC~~ SOLN
0.0000 [IU] | Freq: Three times a day (TID) | SUBCUTANEOUS | Status: DC
Start: 2017-03-17 — End: 2017-03-21
  Administered 2017-03-17: 3 [IU] via SUBCUTANEOUS
  Administered 2017-03-17: 9 [IU] via SUBCUTANEOUS
  Administered 2017-03-18: 3 [IU] via SUBCUTANEOUS
  Administered 2017-03-18: 2 [IU] via SUBCUTANEOUS
  Administered 2017-03-19: 7 [IU] via SUBCUTANEOUS
  Administered 2017-03-20: 2 [IU] via SUBCUTANEOUS
  Administered 2017-03-21: 9 [IU] via SUBCUTANEOUS

## 2017-03-17 MED ORDER — DARBEPOETIN ALFA 200 MCG/0.4ML IJ SOSY
200.0000 ug | PREFILLED_SYRINGE | INTRAMUSCULAR | Status: DC
  Administered 2017-03-19 – 2017-03-25 (×2): 200 ug via SUBCUTANEOUS
  Filled 2017-03-17 (×3): qty 0.4

## 2017-03-17 MED ORDER — LOPERAMIDE HCL 2 MG PO CAPS
4.0000 mg | ORAL_CAPSULE | Freq: Once | ORAL | Status: AC
  Administered 2017-03-17: 4 mg via ORAL
  Filled 2017-03-17: qty 2

## 2017-03-17 NOTE — Progress Notes (Signed)
[place] PHYSICAL MEDICINE & REHABILITATION     PROGRESS NOTE  Subjective/Complaints:  Patient seen sitting up at the edge of the bed this morning eating breakfast. He states he slept well overnight. Wife at bedside. Patient was noted to have symptomatic hypoglycemia yesterday, discussed with nursing.  ROS: Denies CP, SOB, nausea, vomiting, diarrhea.  Objective: Vital Signs: Blood pressure (!) 106/50, pulse 85, temperature 99.9 F (37.7 C), temperature source Axillary, resp. rate 18, weight 76 kg (167 lb 8.8 oz), SpO2 98 %. No results found. Recent Labs    03/15/17 0643  WBC 8.9  HGB 9.2*  HCT 29.6*  PLT 492*   Recent Labs    03/15/17 0643  NA 132*  K 3.6  CL 94*  GLUCOSE 501*  BUN 44*  CREATININE 5.55*  CALCIUM 8.7*   CBG (last 3)  Recent Labs    03/16/17 1934 03/16/17 2059 03/17/17 0629  GLUCAP 190* 305* 380*    Wt Readings from Last 3 Encounters:  03/17/17 76 kg (167 lb 8.8 oz)  03/14/17 75 kg (165 lb 5.5 oz)  03/12/17 78 kg (171 lb 15.3 oz)    Physical Exam:  BP (!) 106/50   Pulse 85   Temp 99.9 F (37.7 C) (Axillary)   Resp 18   Wt 76 kg (167 lb 8.8 oz)   SpO2 98%   BMI 24.74 kg/m  Constitutional: He appears well-developed and well-nourished.  HENT:  Head: Normocephalic. Ulcer on bridge of nosehealing Eyes: EOM are normal. No discharge.  Cardiovascular: RRR. No JVD. Respiratory: Effort normal and breath sounds normal.   GI: Bowel sounds are normal. He exhibits no distension.   Musculoskeletal: He exhibits edemaand tenderness.  Neurological: He is alert and oriented.  Motor: B/l UE 4+/5 proximal to distal RLE: HF 4/5 (pain inhibition, stable) LLE: HF 4/5 (pain inhibition) Skin:  See above Left BKA with dressing C/D/I Right BKA with dressing C/D/I  Assessment/Plan: 1. 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 Physiatrist is  providing close team supervision and 24 hour management of active medical problems listed below. Physiatrist and rehab team continue to assess barriers to discharge/monitor patient progress toward functional and medical goals.  Function:  Bathing Bathing position   Position: Bed  Bathing parts Body parts bathed by patient: Right arm, Left arm, Chest, Abdomen, Right upper leg, Left upper leg Body parts bathed by helper: Back  Bathing assist Assist Level: Touching or steadying assistance(Pt > 75%)      Upper Body Dressing/Undressing Upper body dressing   What is the patient wearing?: Pull over shirt/dress     Pull over shirt/dress - Perfomed by patient: Thread/unthread right sleeve, Thread/unthread left sleeve, Put head through opening, Pull shirt over trunk          Upper body assist Assist Level: (MaxA for balance sitting EOB)      Lower Body Dressing/Undressing Lower body dressing   What is the patient wearing?: Pants       Pants- Performed by helper: Thread/unthread right pants leg, Thread/unthread left pants leg, Pull pants up/down                      Lower body assist Assist for lower body dressing: (MaxA)      Toileting Toileting Toileting activity did not occur: No continent bowel/bladder event        Toileting assist  Transfers Chair/bed transfer   Chair/bed transfer method: Lateral scoot Chair/bed transfer assist level: Maximal assist (Pt 25 - 49%/lift and lower) Chair/bed transfer assistive device: Sliding board     Locomotion Ambulation Ambulation activity did not occur: N/A         Wheelchair   Type: Manual Max wheelchair distance: 150' Assist Level: Supervision or verbal cues  Cognition Comprehension Comprehension assist level: Follows basic conversation/direction with no assist  Expression Expression assist level: Expresses basic needs/ideas: With no assist  Social Interaction Social Interaction assist level: Interacts  appropriately 75 - 89% of the time - Needs redirection for appropriate language or to initiate interaction.  Problem Solving Problem solving assist level: Solves basic problems with no assist  Memory Memory assist level: Recognizes or recalls 75 - 89% of the time/requires cueing 10 - 24% of the time    Medical Problem List and Plan: 1. 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 Bilateral wound vacs discontinued 03/05/2017 per [name]   Continue CIR 2. DVT Prophylaxis/Anticoagulation: Heparin per nephrology 3. Pain Management: Flexeril and Oxycodone as needed 4. Mood: Provide emotional support 5. Neuropsych: This patient is capable of making decisions on his own behalf. 6. Skin/Wound Care: Routine skin checks 7. Fluids/Electrolytes/Nutrition: Routine I&O's 8. Acute on chronic anemia. Continue Aranesp   Hemoglobin 9.2 on 12/5   Cont to monitor 9. End-stage renal disease/CPPD. Follow-up renal services 10. Diabetes mellitus peripheral neuropathy. Latest hemoglobin A1c 8.8.   Patient brittle diabetic with significant changes with mild adjustments to insulin   Check blood sugars before meals and at bedtime.    NPH insulin 65 units daily at breakfast, decreased to 55 on 12/7     NPH 50 units daily at bedtime, decreased to 45 on 12/6.    Sliding scale changed to sensitive   Extremely labile with symptomatic hypoglycemia yesterday 11. CAD with CABG. Continue Plavix and aspirin. No chest pain or shortness of breath 12. Diastolic congestive heart failure. Monitor for any signs of fluid overload Filed Weights   03/15/17 0707 03/15/17 0945 03/17/17 0700  Weight: 73 kg (160 lb 15 oz) 68 kg (149 lb 14.6 oz) 76 kg (167 lb 8.8 oz)  13. SIRS. Resolved.   Antibiotics completed. Blood cultures no growth. Monitor for any fever  14. Hypertension. Coreg 12.5 mg twice daily    Labile, but overall controlled   Orthostatics ordered 15. New onset atrial  fibrillation. Follow-up cardiology services. Coreg 12.5 mg twice daily 16. Hyperlipidemia. Lipitor 17. Constipation. Laxative assistance  LOS (Days) 3 A FACE TO FACE EVALUATION WAS PERFORMED  Duffy Dantonio Karis Jubanil Zamari Vea 03/17/2017 8:59 AM

## 2017-03-17 NOTE — Progress Notes (Signed)
Occupational Therapy Session Note  Patient Details  Name: Joshua [name] MRN: [id number] Date of Birth: 1941/03/25  Today's Date: 03/17/2017 OT Individual Time: 1002-1058 OT Individual Time Calculation (min): 56 min    Short Term Goals: Week 1:  OT Short Term Goal 1 (Week 1): Pt will transfer to drop arm BSC with mod assist with slide board. OT Short Term Goal 2 (Week 1): Pt will don pants in bed with min A. OT Short Term Goal 3 (Week 1): Pt will perform lateral leans on BSC with S to assist with clothing management. OT Short Term Goal 4 (Week 1): Pt will maintain sitting balance EOB during ADL completion with MinA OT Short Term Goal 5 (Week 1): Pt will perform bed mobility with MinA in preparation for EOB ADL completion.   Skilled Therapeutic Interventions/Progress Updates:    Pt seen for ADl session.  BP taken in supine prior to sitting EOB at 120/48.  HR 62 as well and O2 at 93% on room air.  Pt groggy throughout session and would continually close his eyes and stop working.  Max assist for sitting balance on deflated EOB with increased falling posteriorly.  Therapist had to sit behind him to keep him from falling backwards during bathing tasks.  Pt needed mod instructional cueing for sequencing bathing secondary to continually washing the same body parts.  Mod assist for donning pullover shirt as well.  Transitioned to supine for LB selfcare based on pt's attention level and his sitting balance.  Max assist for rolling side to side with pt completing part of front peri washing and therapist completing washing buttocks.  Total assist for donning underpants and pants over LEs and for pulling them up over his hips with rolling.  Pt left in bed with bed alarm in place and call button in reach to conclude session.  Notified nursing of pt's cognitive level.    Therapy Documentation Precautions:  Precautions Precautions: Fall Restrictions Weight Bearing Restrictions: Yes RLE Weight Bearing:  Non weight bearing RLE Partial Weight Bearing Percentage or Pounds: BKA LLE Weight Bearing: Non weight bearing LLE Partial Weight Bearing Percentage or Pounds: BKA   Pain: Pain Assessment Pain Assessment: No/denies pain Pain Score: 0-No pain ADL: See Function Navigator for Current Functional Status.   Therapy/Group: Individual Therapy  Daeja Helderman,[place] OTR/L 03/17/2017, 12:42 PM

## 2017-03-17 NOTE — Progress Notes (Signed)
Social Work   Arling Cerone, Elveria Risingebecca G, LCSW  Social Worker  Physical Medicine and Rehabilitation  Patient Care Conference  Signed  Date of Service:  03/15/2017  2:38 PM          Signed          [] Hide copied text  [] Hover for details   Inpatient RehabilitationTeam Conference and Plan of Care Update Date: 03/15/2017   Time: 2:25 PM      Patient Name: Joshua KitchenJames D Dulay      Medical Record Number: [id number]  Date of Birth: [birth year] Sex: Male         Room/Bed: 4M08C/4M08C-01 Payor Info: Payor: MEDICARE / Plan: MEDICARE PART A AND B / Product Type: *No Product type* /     Admitting Diagnosis: B BKA  Admit Date/Time:  03/14/2017  3:12 PM Admission Comments: No comment available    Primary Diagnosis:  <principal problem not specified> Principal Problem: <principal problem not specified>       Patient Active Problem List    Diagnosis Date Noted  . Type 2 diabetes mellitus with peripheral neuropathy (HCC)    . New onset atrial fibrillation (HCC)    . Confusion 03/12/2017  . Loose stools    . Essential hypertension    . Acute combined systolic and diastolic congestive heart failure (HCC)    . Poorly controlled type 2 diabetes mellitus with peripheral neuropathy (HCC)    . S/P bilateral below knee amputation (HCC) 03/06/2017  . Constipation due to pain medication    . Coronary artery disease involving native coronary artery of native heart without angina pectoris    . Hyperlipidemia    . S/P unilateral BKA (below knee amputation), right (HCC)    . SIRS (systemic inflammatory response syndrome) (HCC) 03/05/2017  . S/P bilateral BKA (below knee amputation) (HCC) 03/01/2017  . Gangrene of right foot (HCC) 02/23/2017  . Blood in stool    . Wound dehiscence    . Rupture of operation wound    . Toe amputation status, right (HCC)    . Pressure injury of head, stage 3 (HCC)    . Poor nutrition    . Brittle diabetes (HCC)    . Adjustment disorder with depressed mood    .  Hypoglycemia    . Ischemic pain of right foot    . Arterial hypotension    . Diabetes mellitus type 2 in obese (HCC)    . PVD (peripheral vascular disease) (HCC)    . Delirium    . History of left below knee amputation (HCC) 01/17/2017  . Acute blood loss anemia    . Elevated AST (SGOT) 01/09/2017  . Chest pain 01/09/2017  . NSTEMI (non-ST elevated myocardial infarction) (HCC) 01/09/2017  . Hypokalemia 01/07/2017  . Hypomagnesemia 01/07/2017  . Left foot infection 01/06/2017  . Chronic hypotension 01/06/2017  . 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 IDDM (insulin dependent diabetes mellitus) (HCC) 01/06/2017  . Foot infection 01/06/2017  . Labile blood pressure    . Hallucination    . Benign essential HTN    . Ulcer of toe of right foot (HCC)    . Ulcer of external ear, limited to breakdown of skin (HCC)    . Chronic congestive heart failure (HCC)    . Anemia of chronic disease    . Labile blood glucose    . Uncontrolled diabetes mellitus type 2 with peripheral artery disease (HCC)    . Diabetic peripheral neuropathy (HCC)    .  Debility 12/27/2016  . History of transmetatarsal amputation of left foot (HCC) 12/27/2016  . Constipation    . Chronic obstructive pulmonary disease (HCC)    . Nausea    . ESRD on dialysis (HCC)    . Leukocytosis    . Tachypnea    . FUO (fever of unknown origin)    . Encephalopathy 12/23/2016  . Sore throat 12/23/2016  . Gangrene of left foot (HCC) 11/26/2016  . PAD (peripheral artery disease) (HCC) 11/26/2016  . Sjogren's syndrome (HCC) 09/26/2015  . Uncontrolled type 2 diabetes mellitus with hyperglycemia, with long-term current use of insulin (HCC) 09/26/2015  . Hyperosmolar non-ketotic state in patient with type 2 diabetes mellitus (HCC) 09/25/2015  . Ileus (HCC) 09/22/2015  . Acute respiratory failure with hypoxia (HCC) 09/22/2015  . Acute gouty arthritis 09/22/2015  . CAD (coronary artery disease) of artery  bypass graft 09/22/2015  . Sepsis (HCC) 09/21/2015  . Elevated troponin level 09/21/2015  . ESRD on peritoneal dialysis (HCC) 09/21/2015  . CVA (cerebral infarction) 09/21/2015      Expected Discharge Date:  76  Team Members Present: Physician leading conference: Dr. Maryla Morrow Social Worker Present: Dossie Der, LCSW Nurse Present: Chrissie Noa, RN PT Present: Woodfin Ganja, PT OT Present: Perrin Maltese, OT SLP Present: Jackalyn Lombard, SLP PPS Coordinator present : Tora Duck, RN, CRRN       Current Status/Progress Goal Weekly Team Focus  Medical     Decreased functional mobility secondary to right BKA and revision of left BKA 03/01/2017.   Improve mobility, endurance, transfers, Afib  See above   Bowel/Bladder     Oliguric; PD daily; Continent of bowel LBM 12/6.  Having loose BMs at times; on miralax, colace  Remain continent of bowel and bladder with mod I assist  Assess and treat for constipation; assess for need to decrease bowel medication for loose stools.   Swallow/Nutrition/ Hydration     Poor appetite; on premier protein         ADL's               Mobility     Max A slideboard transfers, mod-max bed mobility, supervision w/c, min-mod a sitting balance  Min A transfers, supervision bed mobility, supervision power w/c  endurance, transfers, bed mobility, sitting balance   Communication               Safety/Cognition/ Behavioral Observations             Pain     Pain to right lower extremity stump; tylenol, oxycodone 5 mg in use.  Patient states relief with dangling stump off bed  < 4  Assess and treat for pain q shift and prn   Skin     Right BKA with staples- some darker areas (possibly necrotic); dressing changes performed per order; left stump with sutures and Duda stump shrinker; scab to bridge of nose  No further skin breakdown or infection with mod/max assist.  Assess skin q shift and prn; perform dressing changes per MD order; on air overlay mattress      *See Care Plan and progress notes for long and short-term goals.      Barriers to Discharge   Current Status/Progress Possible Resolutions Date Resolved   Physician     Decreased caregiver support;Medical stability;Home environment access/layout;Wound Care;Lack of/limited family support;Other (comments)  PD  See above  Therapies, follow labs, optimize afib meds, optimize DM/BP meds      Nursing  Wound Care;Weight bearing restrictions;Medical stability             PT                    OT                 SLP            SW Medical stability              Discharge Planning/Teaching Needs:  Home with wife who stays here with him and provides emotional support. Plan to take him home with rehab completed and medically stable      Team Discussion:  Medical isues not able to participate in therapies due to BP issues and dizziness.  Renal MD consulted and orders .  Revisions to Treatment Plan:  Re-eval    Continued Need for Acute Rehabilitation Level of Care: The patient requires daily medical management by a physician with specialized training in physical medicine and rehabilitation for the following conditions: Daily direction of a multidisciplinary physical rehabilitation program to ensure safe treatment while eliciting the highest outcome that is of practical value to the patient.: Yes Daily medical management of patient stability for increased activity during participation in an intensive rehabilitation regime.: Yes Daily analysis of laboratory values and/or radiology reports with any subsequent need for medication adjustment of medical intervention for : Post surgical problems;Diabetes problems;Wound care problems;Renal problems;Other;Blood pressure problems   Lucy Chris 03/17/2017, 8:48 AM                Lamara Brecht, Lemar Livings, LCSW Social Worker Signed   Patient Care Conference Date of Service: 01/04/2017  8:47 AM      Hide copied text Hover for attribution  information Inpatient RehabilitationTeam Conference and Plan of Care Update Date: 01/04/2017   Time: 8:20 AM      Patient Name: DEWITTE VANNICE      Medical Record Number: [id number]  Date of Birth: 12-Sep-1940 Sex: Male         Room/Bed: 4M08C/4M08C-01 Payor Info: Payor: MEDICARE / Plan: MEDICARE PART A AND B / Product Type: *No Product type* /     Admitting Diagnosis: L TMA  Admit Date/Time:  12/27/2016  4:38 PM Admission Comments: No comment available    Primary Diagnosis:  Debility Principal Problem: Debility       Patient Active Problem List    Diagnosis Date Noted  . Labile blood pressure    . Hallucination    . Benign essential HTN    . Ulcer of toe of right foot (HCC)    . Ulcer of external ear, limited to breakdown of skin (HCC)    . Chronic congestive heart failure (HCC)    . Anemia of chronic disease    . Labile blood glucose    . Uncontrolled diabetes mellitus type 2 with peripheral artery disease (HCC)    . Diabetic peripheral neuropathy (HCC)    . Debility 12/27/2016  . History of transmetatarsal amputation of left foot (HCC) 12/27/2016  . Constipation    . Chronic obstructive pulmonary disease (HCC)    . Nausea    . ESRD on dialysis (HCC)    . Leukocytosis    . Tachypnea    . FUO (fever of unknown origin)    . Encephalopathy 12/23/2016  . Sore throat 12/23/2016  . Gangrene of left foot (HCC) 11/26/2016  . PAD (peripheral artery disease) (HCC) 11/26/2016  . Sjogren's syndrome (  HCC) 09/26/2015  . Uncontrolled type 2 diabetes mellitus with hyperglycemia, with long-term current use of insulin (HCC) 09/26/2015  . Hyperosmolar non-ketotic state in patient with type 2 diabetes mellitus (HCC) 09/25/2015  . Ileus (HCC) 09/22/2015  . Acute respiratory failure with hypoxia (HCC) 09/22/2015  . Acute gouty arthritis 09/22/2015  . CAD (coronary artery disease) of artery bypass graft 09/22/2015  . Sepsis (HCC) 09/21/2015  . Elevated troponin level 09/21/2015  . ESRD  on peritoneal dialysis (HCC) 09/21/2015  . CVA (cerebral infarction) 09/21/2015      Expected Discharge Date: Expected Discharge Date: 01/13/17   Team Members Present: Physician leading conference: Dr. Maryla MorrowAnkit Patel Social Worker Present: Dossie DerBecky Lewellyn Fultz, LCSW Nurse Present: Allayne Stackhelsey Evans, RN PT Present: Woodfin GanjaEmily Van Shagen, PT OT Present: Perrin MalteseJames McGuire, OT       Current Status/Progress Goal Weekly Team Focus  Medical     Functional and mobility deficits  secondary to PAD requiring left TMA, right groin wound, multiple medical  Improve mobility, transferes, safety, DM, wounds, infection  See above   Bowel/Bladder     Continent of bowel and bladder; LBM 9/25; PD nightly  Mod I assist  Assess and treat for constipation as needed   Swallow/Nutrition/ Hydration     Poor appetite         ADL's     mod assist for LB selfcare, supervision UB selfcare, mod assist transfers stand pivot with use of the RW.  supervision to min assist  standing balance, transfer training, selfcare retraining, strength endurance, strength training, pt/family education   Mobility     min to mod assist for slideboard transfers, max assist for sit<>stand and gait  supervision-Mod I   adherence to precautions, slideboard transfers, w/c propulsion   Communication               Safety/Cognition/ Behavioral Observations             Pain     C/o pain in left foot intermittently, tylenol and tramadol used only; patient cannot tolerate strong meds  < 4  Assess and treat for pain q shift and prn   Skin     Incision to left foot with minimal serosanguinous drainage, area of purple skin on medial part of incision; stage II to nasal bridge; DTI to right ear; MASD to sacrum (MCG powder), right groin wound dressing changes BID; wound to right 5th toe with aquacel dressings BID  Mod assist  Assess skin q shift and prn; perform dressing changes per MD order     *See Care Plan and progress notes for long and short-term goals.       Barriers to Discharge   Current Status/Progress Possible Resolutions Date Resolved   Physician     Medical stability;Decreased caregiver support;Wound Care;Lack of/limited family support;Other (comments);IV antibiotics     See above  Therapies, optimize DM meds, follow labs, follow foot - ?further intervention, Ortho/Vasc recs, follow halucinations - meds d/ced, likely infection      Nursing   Medical stability;Wound Care;Nutrition means;Weight bearing restrictions  uncontrolled DM, Pain management; PD nightly           PT                    OT                 SLP            SW  Discharge Planning/Teaching Needs:    Home with wife who can provide assist-here daily and observing in therapies.     Team Discussion:  Medical issues-question infection. Dr Lajoyce Corners in yesterday to look at feet. X-ray of foot today. Adjusting pain meds and DM meds. Stage 2 B-ears, toe and groin. IV antibiotics currently, may need to go home with. Downgrade goals to supervision-min assist level  Revisions to Treatment Plan:  Downgrade goals to min-supervision level and DC 10/5    Continued Need for Acute Rehabilitation Level of Care: The patient requires daily medical management by a physician with specialized training in physical medicine and rehabilitation for the following conditions: Daily direction of a multidisciplinary physical rehabilitation program to ensure safe treatment while eliciting the highest outcome that is of practical value to the patient.: Yes Daily medical management of patient stability for increased activity during participation in an intensive rehabilitation regime.: Yes Daily analysis of laboratory values and/or radiology reports with any subsequent need for medication adjustment of medical intervention for : Blood pressure problems;Wound care problems;Other;Post surgical problems;Diabetes problems;Mood/behavior problems   Lucy Chris 01/04/2017, 8:47 AM            Patient ID: [name], male   DOB: December 21, 1940, 76 y.o.   MRN: [id number]

## 2017-03-17 NOTE — Plan of Care (Signed)
  Not Progressing RH BOWEL ELIMINATION RH STG MANAGE BOWEL WITH ASSISTANCE Description STG Manage Bowel with Assistance. min  03/17/2017 1753 - Not Progressing by Alfonso RamusEvans, Rie Mcneil S, RN Note Incontinent of bowel due to urgency at times  RH STG MANAGE BOWEL W/MEDICATION W/ASSISTANCE Description STG Manage Bowel with Medication with Assistance. Min  03/17/2017 1753 - Not Progressing by Alfonso RamusEvans, Emanuell Morina S, RN RH SKIN INTEGRITY RH STG SKIN FREE OF INFECTION/BREAKDOWN Description Free of breakdown, infection with min assist  03/17/2017 1753 - Not Progressing by Alfonso RamusEvans, Zaara Sprowl S, RN Note Requiring total assist for dressing changes   RH STG MAINTAIN SKIN INTEGRITY WITH ASSISTANCE Description STG Maintain Skin Integrity With Assistance. Min  03/17/2017 1753 - Not Progressing by Alfonso RamusEvans, Elenna Spratling S, RN Note Total asisst  RH STG ABLE TO PERFORM INCISION/WOUND CARE W/ASSISTANCE Description STG Able To Perform Incision/Wound Care With Assistance. Min  03/17/2017 1753 - Not Progressing by Alfonso RamusEvans, Jiovany Scheffel S, RN

## 2017-03-17 NOTE — Progress Notes (Signed)
PD treatment completed overnight without incident.    UF 1037 mL  Catheter clamped, capped, and secured in patient's belt.  Will continue to monitor

## 2017-03-17 NOTE — Progress Notes (Signed)
Physical Therapy Session Note  Patient Details  Name: [name] MRN: [id number] Date of Birth: 10/19/1940  Today's Date: 03/17/2017 PT Individual Time: 1300-1330 PT Individual Time Calculation (min): 30 min  and Today's Date: 03/17/2017 PT Missed Time: 30 Minutes + 60 min  Missed Time Reason: Other (Comment)(lethary, decreased arousal)  Short Term Goals: Week 1:  PT Short Term Goal 1 (Week 1): Pt will perform bed mobility w/ Min A  PT Short Term Goal 2 (Week 1): Pt will transfer bed<>chair w/ Mod A  PT Short Term Goal 3 (Week 1): Pt will self-propel w/c 75' w/ supervision PT Short Term Goal 4 (Week 1): Pt will maintain static sitting balance w/ supervision   Skilled Therapeutic Interventions/Progress Updates:   Session 1:  Pt supine upon arrival and agreeable to therapy, no c/o pain, but feeling a little "groggy". Transferred to EOB w/ Mod assist and to w/c via slide board transfer w/ max assist x2. Pt unable to use UEs to lift and scoot, max assist for weight shifting. Pt required increased time and frequent rest breaks 2/2 fatigue and increased work of breathing. Max assist to reposition in chair for adequate pressure relief. Pt began to say things out of context, demonstrated decreased attention to task, and had brief periods of closing eyes and being unresponsive. Vitals WNL and pt able to reorient easily. Per OT report, pt was having similar episodes this morning during session 2/2 medications. Ended session early because of this, unable to maintain attention long enough to tolerate treatment activities, will follow up later this afternoon. Pt in care of wife and sitting up in motorized chair, all needs met. Wife agreeable to stay with pt for pt's safety in motorized chair, NT made aware.   Session 2:  Made multiple attempts for session this afternoon, however pt unable to stay awake long enough to rate his pain as he was c/o of R residual limb pain. Missed 60 minutes of skilled PT.    Therapy Documentation Precautions:  Precautions Precautions: Fall Restrictions Weight Bearing Restrictions: Yes RLE Weight Bearing: Non weight bearing RLE Partial Weight Bearing Percentage or Pounds: BKA LLE Weight Bearing: Non weight bearing LLE Partial Weight Bearing Percentage or Pounds: BKA   See Function Navigator for Current Functional Status.   Therapy/Group: Individual Therapy  Amy K Arnette 03/17/2017, 4:25 PM  

## 2017-03-17 NOTE — Progress Notes (Signed)
[facility] Progress Note   Assessment/ Plan:    [place] [facility]. 6 exchanges/day one mid-day exchange (occurs around 9am).  2.5 liters of 2.5% per [facility] 2 liters of 2.5% for last fill and mid day drain. Dr. Denny PeonBaveja.   -baseline weight for him pre amputation was around 190lbs  1. SOB - likely due to vol overload, improved.  Changed formulation to 1 1.5% and 2 2.5% mixture bags 2. DM2- initially hyperglycemic, now intermittently hypoglycemic.  Not eating well 3. Afib with RVR - per primary.  4. PAD sp revision L BKA and new R BKA on 11/21 5. [place] [facility] [place]  6. Hypokalemia - Closely following K, on supplementation. 7. HTN/ vol - coreg 6.25mg  hs. 8. MBD of CKD - holding phoslo for lowish/ controlled  Phos = 3.6/3.7 ;  Correc Ca= 8.9  No vit D 9. UTI - blood cx's neg, no urine cx in system. Sp short course abx. 10. Malnutrition - alb 1.3   Prostat, +Nepro.  11. Anemia of CKD - ginc to 200 mcg q week    Subjective:    Continues to have some low blood sugars.  BP better.     Objective:   BP (!) 106/50   Pulse 85   Temp 99.9 F (37.7 C) (Axillary)   Resp 18   Wt 76 kg (167 lb 8.8 oz)   SpO2 98%   BMI 24.74 kg/m   Physical Exam: General:  Alert ,NAD, OX3 appropriate  Heart: RRR; No m, r, g Lungs: CTA bilat anteriorly, diminished at the bases. Abdomen: soft, non-tender., non distended, PD cath in place. Extremities: L BKA and new R BKA.  Edema much better Dialysis Access: PD cath, L AVF + thrill, bruit not developed well     Labs: BMET Recent Labs  Lab 03/10/17 1203 03/11/17 0730 03/12/17 1429 03/13/17 0516 03/13/17 0951 03/14/17 0603 03/15/17 0643  NA 130*  --  136 132*  --  135 132*  K 2.6*  --  2.3* 3.0*  --  4.2 3.6  CL 93*  --  99* 94*  --  96* 94*  CO2 26  --  29 27  --  25 26  GLUCOSE 328* 607* 141* 624* 750* 328* 501*  BUN 49*  --  52* 49*  --  49* 44*  CREATININE 5.06*  --  5.03* 5.22*  --   5.37* 5.55*  CALCIUM 7.8*  --  8.0* 8.2*  --  8.7* 8.7*  PHOS 3.8  --   --   --   --   --   --    CBC Recent Labs  Lab 03/12/17 1429 03/13/17 0516 03/14/17 0641 03/15/17 0643  WBC 13.1* 9.6 10.2 8.9  NEUTROABS  --   --   --  6.1  HGB 7.9* 9.0* 9.7* 9.2*  HCT 25.2* 28.7* 31.1* 29.6*  MCV 93.7 95.3 94.5 96.1  PLT 416* 457* 462* 492*    @IMGRELPRIORS @ Medications:    . aspirin EC  81 mg Oral Daily  . atorvastatin  80 mg Oral q1800  . calcium acetate  667 mg Oral TID WC  . carvedilol  12.5 mg Oral BID WC  . chlorhexidine  15 mL Mouth/Throat BID  . clopidogrel  75 mg Oral Daily  . [START ON 03/18/2017] darbepoetin (ARANESP) injection - NON-DIALYSIS  100 mcg Subcutaneous Q Sat-1800  . docusate sodium  100 mg Oral BID  . feeding supplement (NEPRO CARB  STEADY)  237 mL Oral BID BM  . feeding supplement (PRO-STAT SUGAR FREE 64)  30 mL Oral BID  . gentamicin cream  1 application Topical Daily  . insulin aspart  0-9 Units Subcutaneous TID WC  . insulin NPH Human  45 Units Subcutaneous QHS  . insulin NPH Human  55 Units Subcutaneous QAC breakfast  . multivitamin  1 tablet Oral QHS  . polyethylene glycol  17 g Oral Daily  . [START ON 03/30/2017] Vitamin D (Ergocalciferol)  50,000 Units Oral Q30 days     Bufford ButtnerElizabeth Arohi Salvatierra MD [facility] pgr [phone] 03/17/2017, 11:14 AM

## 2017-03-18 LAB — GLUCOSE, CAPILLARY
GLUCOSE-CAPILLARY: 73 mg/dL (ref 65–99)
Glucose-Capillary: 238 mg/dL — ABNORMAL HIGH (ref 65–99)
Glucose-Capillary: 184 mg/dL — ABNORMAL HIGH (ref 65–99)
Glucose-Capillary: 60 mg/dL — ABNORMAL LOW (ref 65–99)
Glucose-Capillary: 215 mg/dL — ABNORMAL HIGH (ref 65–99)

## 2017-03-18 NOTE — Progress Notes (Signed)
Pt was placed on home cpap by family before RT arrived.  RT will monitor.

## 2017-03-18 NOTE — Progress Notes (Signed)
[facility] Progress Note   Assessment/ Plan:    Center: [place] [facility]. 6 exchanges/day one mid-day exchange (occurs around 9am).  2.5 liters of 2.5% per [facility] 2 liters of 2.5% for last fill and mid day drain. Dr. Denny PeonBaveja.   -baseline weight for him pre amputation was around 190lbs  1. SOB - likely due to vol overload, improved.  Changed formulation to 1 1.5% and 2 2.5% mixture bags 2. DM2- initially hyperglycemic, now intermittently hypoglycemic.  Not eating well 3. Afib with RVR - per primary.  4. PAD sp revision L BKA and new R BKA on 11/21 5. [place] [facility] [place].  6. Hypokalemia - Closely following K, on supplementation. 7. HTN/ vol - BP controlled coreg 6.25mg  hs. 8. MBD of CKD - holding phoslo for lowish/ controlled  Phos = 3.6/3.7. No Vit D. Follow trend  9. UTI - blood cx's neg, no urine cx in system. Sp short course abx. 10. Malnutrition - alb 1.3   Prostat, +Nepro.  11. Anemia of CKD - Darbe increased to 200 mcg q week    Subjective:    Seen in hallway, wife present. Having better day today. Diarrhea yesterday, improved with Imodium    Objective:   BP (!) 125/55 (BP Location: Right Arm)   Pulse 96   Temp 98.6 F (37 C) (Oral)   Resp 18   Wt 73.5 kg (162 lb)   SpO2 100%   BMI 23.92 kg/m   Physical Exam: General:  Alert ,NAD, OX3 appropriate  Heart: RRR; No m, r, g Lungs: CTA bilat anteriorly, diminished at the bases. Abdomen: soft, non-tender., non distended, PD cath in place. Extremities: L BKA and new R BKA. No stump edema Dialysis Access: PD cath, L AVF + thrill, bruit not developed well     Labs: BMET Recent Labs  Lab 03/12/17 1429 03/13/17 0516 03/13/17 0951 03/14/17 0603 03/15/17 0643  NA 136 132*  --  135 132*  K 2.3* 3.0*  --  4.2 3.6  CL 99* 94*  --  96* 94*  CO2 29 27  --  25 26  GLUCOSE 141* 624* 750* 328* 501*  BUN 52* 49*  --  49* 44*  CREATININE 5.03* 5.22*  --  5.37* 5.55*  CALCIUM  8.0* 8.2*  --  8.7* 8.7*   CBC Recent Labs  Lab 03/12/17 1429 03/13/17 0516 03/14/17 0641 03/15/17 0643  WBC 13.1* 9.6 10.2 8.9  NEUTROABS  --   --   --  6.1  HGB 7.9* 9.0* 9.7* 9.2*  HCT 25.2* 28.7* 31.1* 29.6*  MCV 93.7 95.3 94.5 96.1  PLT 416* 457* 462* 492*    @IMGRELPRIORS @ Medications:    . aspirin EC  81 mg Oral Daily  . atorvastatin  80 mg Oral q1800  . calcium acetate  667 mg Oral TID WC  . carvedilol  12.5 mg Oral BID WC  . chlorhexidine  15 mL Mouth/Throat BID  . clopidogrel  75 mg Oral Daily  . darbepoetin (ARANESP) injection - NON-DIALYSIS  200 mcg Subcutaneous Q Sat-1800  . docusate sodium  100 mg Oral BID  . feeding supplement (NEPRO CARB STEADY)  237 mL Oral BID BM  . feeding supplement (PRO-STAT SUGAR FREE 64)  30 mL Oral BID  . gentamicin cream  1 application Topical Daily  . insulin aspart  0-9 Units Subcutaneous TID WC  . insulin NPH Human  45 Units Subcutaneous QHS  . insulin NPH Human  55 Units Subcutaneous QAC breakfast  . multivitamin  1 tablet Oral QHS  . polyethylene glycol  17 g Oral Daily  . [START ON 03/30/2017] Vitamin D (Ergocalciferol)  50,000 Units Oral Q30 days    Tomasa Blasegechi Grace Ejigiri PA-C [facility] Pager [phone] 03/18/2017,1:16 PM  Pt seen, examined and agree w A/P as above.  Vinson Moselleob Jissell Trafton MD [facility] pager [phone]   03/18/2017, 1:42 PM

## 2017-03-18 NOTE — Progress Notes (Signed)
Hypoglycemic Event  CBG: 60  Treatment: 15 GM carbohydrate snack- and eating dinner  Symptoms: None  Follow-up CBG: Time:1843 CBG Result:73  Possible Reasons for Event: Unknown  Comments/MD notified: 1st treatment successful.     Quentin Shorey, Sylvie FarrierAshley Marie

## 2017-03-18 NOTE — Progress Notes (Signed)
Patient has home unit CPAP. Family at bedside is able to place patient on CPAP when he is ready.

## 2017-03-18 NOTE — Progress Notes (Signed)
[place] PHYSICAL MEDICINE & REHABILITATION     PROGRESS NOTE  Subjective/Complaints:   No issues overnite  ROS: Denies CP, SOB, nausea, vomiting, diarrhea.  Objective: Vital Signs: Blood pressure (!) 125/55, pulse 96, temperature 98.6 F (37 C), temperature source Oral, resp. rate 18, weight 73.5 kg (162 lb), SpO2 100 %. No results found. No results for input(s): WBC, HGB, HCT, PLT in the last 72 hours. No results for input(s): NA, K, CL, GLUCOSE, BUN, CREATININE, CALCIUM in the last 72 hours.  Invalid input(s): CO CBG (last 3)  Recent Labs    03/17/17 1643 03/17/17 2045 03/18/17 0634  GLUCAP 187* 211* 238*    Wt Readings from Last 3 Encounters:  03/18/17 73.5 kg (162 lb)  03/14/17 75 kg (165 lb 5.5 oz)  03/12/17 78 kg (171 lb 15.3 oz)    Physical Exam:  BP (!) 125/55 (BP Location: Right Arm)   Pulse 96   Temp 98.6 F (37 C) (Oral)   Resp 18   Wt 73.5 kg (162 lb)   SpO2 100%   BMI 23.92 kg/m  Constitutional: He appears well-developed and well-nourished.  HENT:  Head: Normocephalic.  Eyes: EOM are normal. No discharge.  Cardiovascular: RRR. No JVD. Respiratory: Effort normal and breath sounds normal.   GI: Bowel sounds are normal. He exhibits no distension.   Musculoskeletal: He exhibits edemaand tenderness.  Neurological: He is alert and oriented.  Motor: B/l UE 4+/5 proximal to distal RLE: HF 4/5 (pain inhibition, stable) LLE: HF 4/5 (pain inhibition) Skin:  See above Left BKA with dressing C/D/I Right BKA with dressing C/D/I  Assessment/Plan: 1. 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 Physiatrist is providing close team supervision and 24 hour management of active medical problems listed below. Physiatrist and rehab team continue to assess barriers to discharge/monitor patient progress toward functional and medical  goals.  Function:  Bathing Bathing position   Position: Sitting EOB  Bathing parts Body parts bathed by patient: Right arm, Left arm, Abdomen, Chest, Front perineal area, Right upper leg, Left upper leg Body parts bathed by helper: Buttocks, Back  Bathing assist Assist Level: Touching or steadying assistance(Pt > 75%)      Upper Body Dressing/Undressing Upper body dressing   What is the patient wearing?: Pull over shirt/dress     Pull over shirt/dress - Perfomed by patient: Thread/unthread right sleeve, Thread/unthread left sleeve Pull over shirt/dress - Perfomed by helper: Put head through opening, Pull shirt over trunk        Upper body assist Assist Level: (MaxA for balance sitting EOB)      Lower Body Dressing/Undressing Lower body dressing   What is the patient wearing?: Pants, Underwear   Underwear - Performed by helper: Thread/unthread right underwear leg, Thread/unthread left underwear leg, Pull underwear up/down   Pants- Performed by helper: Thread/unthread right pants leg, Thread/unthread left pants leg, Pull pants up/down                      Lower body assist Assist for lower body dressing: (MaxA)      Toileting Toileting Toileting activity did not occur: No continent bowel/bladder event   Toileting steps completed by helper: Adjust clothing prior to toileting, Performs perineal hygiene, Adjust clothing after toileting Toileting Assistive Devices: Grab bar or rail  Toileting assist Assist level: Touching or steadying assistance (Pt.75%)   Transfers Chair/bed transfer  Chair/bed transfer method: Lateral scoot Chair/bed transfer assist level: 2 helpers Chair/bed transfer assistive device: Sliding board     Locomotion Ambulation Ambulation activity did not occur: N/A         Wheelchair   Type: Manual Max wheelchair distance: 150' Assist Level: Supervision or verbal cues  Cognition Comprehension Comprehension assist level: Understands  basic 75 - 89% of the time/ requires cueing 10 - 24% of the time  Expression Expression assist level: Expresses basic 75 - 89% of the time/requires cueing 10 - 24% of the time. Needs helper to occlude trach/needs to repeat words.  Social Interaction Social Interaction assist level: Interacts appropriately 75 - 89% of the time - Needs redirection for appropriate language or to initiate interaction.  Problem Solving Problem solving assist level: Solves basic 75 - 89% of the time/requires cueing 10 - 24% of the time  Memory Memory assist level: Recognizes or recalls 75 - 89% of the time/requires cueing 10 - 24% of the time    Medical Problem List and Plan: 1. 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 Bilateral wound vacs discontinued 03/05/2017 per [name]   Continue CIR 2. DVT Prophylaxis/Anticoagulation: Heparin per nephrology 3. Pain Management: Flexeril and Oxycodone as needed 4. Mood: Provide emotional support 5. Neuropsych: This patient is capable of making decisions on his own behalf. 6. Skin/Wound Care: Routine skin checks 7. Fluids/Electrolytes/Nutrition: Routine I&O's 8. Acute on chronic anemia. Continue Aranesp   Hemoglobin 9.2 on 12/5   Cont to monitor 9. End-stage renal disease/CPPD. Follow-up renal services 10. Diabetes mellitus peripheral neuropathy. Latest hemoglobin A1c 8.8.   Patient brittle diabetic with significant changes with mild adjustments to insulin   Check blood sugars before meals and at bedtime.    NPH insulin 65 units daily at breakfast, decreased to 55 on 12/7     NPH 50 units daily at bedtime, decreased to 45 on 12/6.    Sliding scale changed to sensitive   Will not change dose today cont to monitor 12/8 CBG (last 3)  Recent Labs    03/17/17 1643 03/17/17 2045 03/18/17 0634  GLUCAP 187* 211* 238*   11. CAD with CABG. Continue Plavix and aspirin. No chest pain or shortness of breath 12. Diastolic  congestive heart failure. Monitor for any signs of fluid overload Filed Weights   03/17/17 0700 03/17/17 1820 03/18/17 0647  Weight: 76 kg (167 lb 8.8 oz) 71 kg (156 lb 8.4 oz) 73.5 kg (162 lb)  13. SIRS. Resolved.   Antibiotics completed. Blood cultures no growth. Monitor for any fever  14. Hypertension. Coreg 12.5 mg twice daily Controlled 12/8    Vitals:   03/17/17 2135 03/18/17 0523  BP:  (!) 125/55  Pulse: 86 96  Resp: 18 18  Temp:  98.6 F (37 C)  SpO2: 95% 100%     Orthostatics ordered 15. New onset atrial fibrillation. Follow-up cardiology services. Coreg 12.5 mg twice daily 16. Hyperlipidemia. Lipitor 17. Constipation. Laxative assistance  LOS (Days) 4 A FACE TO FACE EVALUATION WAS PERFORMED  Erick Colacendrew E Kirsteins 03/18/2017 7:51 AM

## 2017-03-18 NOTE — Progress Notes (Signed)
PD treatment completed overnight.  Per wife, machine alarming at start of treatment with message: "Occluded patient side."  Wife used following interventions: line inspection, repositioning patient.  She suspects fibrin.  UF 1408 mL  Wife reporting copious diarrhea yesterday.  PD catheter clamped, capped, and secured in patient waist belt.  Will continue to monitor.

## 2017-03-19 ENCOUNTER — Inpatient Hospital Stay (HOSPITAL_COMMUNITY): Payer: Medicare Other | Admitting: Occupational Therapy

## 2017-03-19 ENCOUNTER — Inpatient Hospital Stay (HOSPITAL_COMMUNITY): Payer: Medicare Other

## 2017-03-19 LAB — RENAL FUNCTION PANEL
ALBUMIN: 1.5 g/dL — AB (ref 3.5–5.0)
ANION GAP: 10 (ref 5–15)
BUN: 63 mg/dL — AB (ref 6–20)
CALCIUM: 8.3 mg/dL — AB (ref 8.9–10.3)
CO2: 27 mmol/L (ref 22–32)
Chloride: 92 mmol/L — ABNORMAL LOW (ref 101–111)
Creatinine, Ser: 5 mg/dL — ABNORMAL HIGH (ref 0.61–1.24)
GFR calc Af Amer: 12 mL/min — ABNORMAL LOW (ref >60)
GFR, EST NON AFRICAN AMERICAN: 10 mL/min — AB (ref >60)
GLUCOSE: 294 mg/dL — AB (ref 65–99)
PHOSPHORUS: 3.6 mg/dL (ref 2.5–4.6)
POTASSIUM: 4 mmol/L (ref 3.5–5.1)
SODIUM: 129 mmol/L — AB (ref 135–145)

## 2017-03-19 LAB — GLUCOSE, CAPILLARY
GLUCOSE-CAPILLARY: 302 mg/dL — AB (ref 65–99)
GLUCOSE-CAPILLARY: 72 mg/dL (ref 65–99)
Glucose-Capillary: 137 mg/dL — ABNORMAL HIGH (ref 65–99)
Glucose-Capillary: 201 mg/dL — ABNORMAL HIGH (ref 65–99)

## 2017-03-19 LAB — CBC
HEMATOCRIT: 28.4 % — AB (ref 39.0–52.0)
HEMOGLOBIN: 8.8 g/dL — AB (ref 13.0–17.0)
MCH: 29.3 pg (ref 26.0–34.0)
MCHC: 31 g/dL (ref 30.0–36.0)
MCV: 94.7 fL (ref 78.0–100.0)
Platelets: 468 10*3/uL — ABNORMAL HIGH (ref 150–400)
RBC: 3 MIL/uL — ABNORMAL LOW (ref 4.22–5.81)
RDW: 16.5 % — ABNORMAL HIGH (ref 11.5–15.5)
WBC: 8.9 10*3/uL (ref 4.0–10.5)

## 2017-03-19 MED ORDER — RENA-VITE PO TABS
1.0000 | ORAL_TABLET | Freq: Every day | ORAL | Status: DC
  Administered 2017-03-21 – 2017-03-28 (×8): 1 via ORAL
  Filled 2017-03-19 (×9): qty 1

## 2017-03-19 MED ORDER — DELFLEX-LC/2.5% DEXTROSE 394 MOSM/L IP SOLN: INTRAPERITONEAL | Status: DC

## 2017-03-19 MED ORDER — INSULIN NPH (HUMAN) (ISOPHANE) 100 UNIT/ML ~~LOC~~ SUSP
48.0000 [IU] | Freq: Every day | SUBCUTANEOUS | Status: DC
  Administered 2017-03-19 – 2017-03-23 (×5): 48 [IU] via SUBCUTANEOUS

## 2017-03-19 MED ORDER — HEPARIN 1000 UNIT/ML FOR PERITONEAL DIALYSIS
500.0000 [IU] | INTRAMUSCULAR | Status: DC | PRN
  Filled 2017-03-19: qty 0.5

## 2017-03-19 MED ORDER — GENTAMICIN SULFATE 0.1 % EX CREA
1.0000 "application " | TOPICAL_CREAM | Freq: Every day | CUTANEOUS | Status: DC
  Administered 2017-03-23: 1 via TOPICAL
  Filled 2017-03-19: qty 15

## 2017-03-19 NOTE — Progress Notes (Signed)
[place] PHYSICAL MEDICINE & REHABILITATION     PROGRESS NOTE  Subjective/Complaints:   RLE  Stump pain relieved by Hanging stump over mattress  ROS: Denies CP, SOB, nausea, vomiting, diarrhea.  Objective: Vital Signs: Blood pressure (!) 139/45, pulse 81, temperature 97.6 F (36.4 C), temperature source Axillary, resp. rate 16, weight 70 kg (154 lb 5.2 oz), SpO2 100 %. No results found. Recent Labs    03/19/17 0603  WBC 8.9  HGB 8.8*  HCT 28.4*  PLT 468*   Recent Labs    03/19/17 0603  NA 129*  K 4.0  CL 92*  GLUCOSE 294*  BUN 63*  CREATININE 5.00*  CALCIUM 8.3*   CBG (last 3)  Recent Labs    03/18/17 1843 03/18/17 2158 03/19/17 0650  GLUCAP 73 215* 302*    Wt Readings from Last 3 Encounters:  03/19/17 70 kg (154 lb 5.2 oz)  03/14/17 75 kg (165 lb 5.5 oz)  03/12/17 78 kg (171 lb 15.3 oz)    Physical Exam:  BP (!) 139/45 (BP Location: Right Arm)   Pulse 81   Temp 97.6 F (36.4 C) (Axillary)   Resp 16   Wt 70 kg (154 lb 5.2 oz)   SpO2 100%   BMI 22.79 kg/m  Constitutional: He appears well-developed and well-nourished.  HENT:  Head: Normocephalic.  Eyes: EOM are normal. No discharge.  Cardiovascular: RRR. No JVD. Respiratory: Effort normal and breath sounds normal.   GI: Bowel sounds are normal. He exhibits no distension.   Musculoskeletal: He exhibits edemaand tenderness.  Neurological: He is alert and oriented.  Motor: B/l UE 4+/5 proximal to distal RLE: HF 4/5 (pain inhibition, stable) LLE: HF 4/5 (pain inhibition) Skin:  See above Left BKA with dressing C/D/I Right BKA-some necrotic tissue around staples mild distal edema, min erythema, appropriately tender  Assessment/Plan: 1. 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 Physiatrist is providing close team supervision and 24 hour management of active medical problems listed  below. Physiatrist and rehab team continue to assess barriers to discharge/monitor patient progress toward functional and medical goals.  Function:  Bathing Bathing position   Position: Sitting EOB  Bathing parts Body parts bathed by patient: Right arm, Left arm, Abdomen, Chest, Front perineal area, Right upper leg, Left upper leg Body parts bathed by helper: Buttocks, Back  Bathing assist Assist Level: Touching or steadying assistance(Pt > 75%)      Upper Body Dressing/Undressing Upper body dressing   What is the patient wearing?: Pull over shirt/dress     Pull over shirt/dress - Perfomed by patient: Thread/unthread right sleeve, Thread/unthread left sleeve Pull over shirt/dress - Perfomed by helper: Put head through opening, Pull shirt over trunk        Upper body assist Assist Level: (MaxA for balance sitting EOB)      Lower Body Dressing/Undressing Lower body dressing   What is the patient wearing?: Pants, Underwear   Underwear - Performed by helper: Thread/unthread right underwear leg, Thread/unthread left underwear leg, Pull underwear up/down   Pants- Performed by helper: Thread/unthread right pants leg, Thread/unthread left pants leg, Pull pants up/down                      Lower body assist Assist for lower body dressing: (MaxA)      Toileting Toileting Toileting activity did not occur: No continent bowel/bladder event   Toileting  steps completed by helper: Adjust clothing prior to toileting, Performs perineal hygiene, Adjust clothing after toileting Toileting Assistive Devices: Grab bar or rail  Toileting assist Assist level: Touching or steadying assistance (Pt.75%)   Transfers Chair/bed transfer   Chair/bed transfer method: Lateral scoot Chair/bed transfer assist level: Maximal assist (Pt 25 - 49%/lift and lower) Chair/bed transfer assistive device: Sliding board     Locomotion Ambulation Ambulation activity did not occur: N/A          Wheelchair   Type: Manual Max wheelchair distance: 150' Assist Level: Supervision or verbal cues  Cognition Comprehension Comprehension assist level: Understands basic 90% of the time/cues < 10% of the time  Expression Expression assist level: Expresses basic 90% of the time/requires cueing < 10% of the time.  Social Interaction Social Interaction assist level: Interacts appropriately 90% of the time - Needs monitoring or encouragement for participation or interaction.  Problem Solving Problem solving assist level: Solves basic 90% of the time/requires cueing < 10% of the time  Memory Memory assist level: Recognizes or recalls 90% of the time/requires cueing < 10% of the time    Medical Problem List and Plan: 1. 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 Bilateral wound vacs discontinued 03/05/2017 per [name]   Continue CIR 2. DVT Prophylaxis/Anticoagulation: Heparin per nephrology 3. Pain Management: Flexeril and Oxycodone as needed 4. Mood: Provide emotional support 5. Neuropsych: This patient is capable of making decisions on his own behalf. 6. Skin/Wound Care: Routine skin checks 7. Fluids/Electrolytes/Nutrition: Routine I&O's 8. Acute on chronic anemia. Continue Aranesp   Hemoglobin 9.2 on 12/5   Cont to monitor 9. End-stage renal disease/CPPD. Follow-up renal services 10. Diabetes mellitus peripheral neuropathy. Latest hemoglobin A1c 8.8.   Patient brittle diabetic with significant changes with mild adjustments to insulin   Check blood sugars before meals and at bedtime.    NPH insulin 65 units daily at breakfast, decreased to 55 on 12/7     NPH 50 units daily at bedtime, decreased to 45 on 12/6. Running high in am will increase to 48U   Sliding scale changed to sensitive   Will not change dose today cont to monitor 12/8 CBG (last 3)  Recent Labs    03/18/17 1843 03/18/17 2158 03/19/17 0650  GLUCAP 73 215* 302*   11.  CAD with CABG. Continue Plavix and aspirin. No chest pain or shortness of breath 12. Diastolic congestive heart failure. Monitor for any signs of fluid overload Filed Weights   03/17/17 1820 03/18/17 0647 03/19/17 0550  Weight: 71 kg (156 lb 8.4 oz) 73.5 kg (162 lb) 70 kg (154 lb 5.2 oz)  13. SIRS. Resolved.   Antibiotics completed. Blood cultures no growth. Monitor for any fever  14. Hypertension. Coreg 12.5 mg twice daily Controlled 12/9    Vitals:   03/18/17 1446 03/19/17 0550  BP: (!) 123/48 (!) 139/45  Pulse: 74 81  Resp: 14 16  Temp: 98.8 F (37.1 C) 97.6 F (36.4 C)  SpO2: 94% 100%     Orthostatics ordered 12/5 normal 15. New onset atrial fibrillation. Follow-up cardiology services. Coreg 12.5 mg twice daily- rate normal 16. Hyperlipidemia. Lipitor 17. Constipation. Laxative assistance  LOS (Days) 5 A FACE TO FACE EVALUATION WAS PERFORMED  Erick Colacendrew E Kirsteins 03/19/2017 9:49 AM

## 2017-03-19 NOTE — Progress Notes (Signed)
[facility] Progress Note   Subjective:   Doing well, no c/o today, no SOB or cough or LE swelling.     Vitals:   03/18/17 0523 03/18/17 0647 03/18/17 1446 03/19/17 0550  BP: (!) 125/55  (!) 123/48 (!) 139/45  Pulse: 96  74 81  Resp: 18  14 16   Temp: 98.6 F (37 C)  98.8 F (37.1 C) 97.6 F (36.4 C)  TempSrc: Oral  Oral Axillary  SpO2: 100%  94% 100%  Weight:  73.5 kg (162 lb)  70 kg (154 lb 5.2 oz)    Inpatient medications: . aspirin EC  81 mg Oral Daily  . atorvastatin  80 mg Oral q1800  . calcium acetate  667 mg Oral TID WC  . carvedilol  12.5 mg Oral BID WC  . chlorhexidine  15 mL Mouth/Throat BID  . clopidogrel  75 mg Oral Daily  . darbepoetin (ARANESP) injection - NON-DIALYSIS  200 mcg Subcutaneous Q Sat-1800  . docusate sodium  100 mg Oral BID  . feeding supplement (NEPRO CARB STEADY)  237 mL Oral BID BM  . feeding supplement (PRO-STAT SUGAR FREE 64)  30 mL Oral BID  . insulin aspart  0-9 Units Subcutaneous TID WC  . insulin NPH Human  48 Units Subcutaneous QHS  . insulin NPH Human  55 Units Subcutaneous QAC breakfast  . multivitamin  1 tablet Oral QHS  . polyethylene glycol  17 g Oral Daily  . [START ON 03/30/2017] Vitamin D (Ergocalciferol)  50,000 Units Oral Q30 days   . dialysis solution 2.5% low-MG/low-CA    . dialysis solution 4.25% low-MG/low-CA     acetaminophen **OR** acetaminophen, nitroGLYCERIN, oxyCODONE, sorbitol  Exam: General:  Alert ,NAD, OX3 appropriate  Heart: RRR; No m, r, g Lungs: CTA bilat anteriorly, diminished at the bases. Abdomen: soft, non-tender., non distended, PD cath in place. Extremities: L BKA and new R BKA. No stump edema Dialysis Access: PD cath, L AVF + thrill, bruit not developed well   [facility] 6 exchanges/day one mid-day exchange (occurs around 9am).  2.5 liters of 2.5% per [facility] 2 liters of 2.5% for last fill and mid day drain. Dr. Denny PeonBaveja.   -baseline weight for  him pre amputation was around 190lbs   Assessment/ Plan:    1. PAD sp revision L BKA and new R BKA on 11/21 2. Vol overload / SOB - resolved, no vol excess on exam, wt's are down 3. DM2- initially hyperglycemic, now intermittently hypoglycemic.  Not eating well 4. Afib with RVR - per primary. 5. [place] on [facility] in [place].  6. Hypokalemia - better, on supplementation. 7. HTN -  BP controlled coreg 6.25mg  bid 8. MBD of CKD - holding phoslo for lowish/ controlled  Phos = 3.6/3.7. No Vit D. Follow trend  9. UTI - blood cx's neg, no urine cx in system. Sp short course abx. 10. Malnutrition - alb 1.3   Prostat, +Nepro.  11. Anemia of CKD - Darbe increased to 200 mcg q week    Plan - will use all 2.5% fluid as they do at home. Looks much better overall.    Vinson Moselleob Ariatna Jester MD [facility] pager [phone]   03/19/2017, 1:41 PM   Recent Labs  Lab 03/14/17 0603 03/15/17 0643 03/19/17 0603  NA 135 132* 129*  K 4.2 3.6 4.0  CL 96* 94* 92*  CO2 25 26 27   GLUCOSE 328* 501* 294*  BUN 49* 44* 63*  CREATININE 5.37* 5.55* 5.00*  CALCIUM 8.7* 8.7* 8.3*  PHOS  --   --  3.6   Recent Labs  Lab 03/12/17 1429 03/15/17 0643 03/19/17 0603  AST 49* 67*  --   ALT 42 64*  --   ALKPHOS 87 101  --   BILITOT 0.5 0.3  --   PROT 5.3* 5.7*  --   ALBUMIN 1.3* 1.5* 1.5*   Recent Labs  Lab 03/14/17 0641 03/15/17 0643 03/19/17 0603  WBC 10.2 8.9 8.9  NEUTROABS  --  6.1  --   HGB 9.7* 9.2* 8.8*  HCT 31.1* 29.6* 28.4*  MCV 94.5 96.1 94.7  PLT 462* 492* 468*   Iron/TIBC/Ferritin/ %Sat    Component Value Date/Time   IRON 28 (L) 02/02/2017 0705   TIBC 158 (L) 02/02/2017 0705   FERRITIN 2,284 (H) 12/31/2016 0602   IRONPCTSAT 18 02/02/2017 0705

## 2017-03-19 NOTE — Progress Notes (Signed)
Physical Therapy Session Note  Patient Details  Name: Joshua KitchenJames D Hetzer MRN: [id number] Date of Birth: 09/03/1940  Today's Date: 03/19/2017 PT Individual Time: 0900-0945 PT Individual Time Calculation (min): 45 min   Short Term Goals: Week 1:  PT Short Term Goal 1 (Week 1): Pt will perform bed mobility w/ Min A  PT Short Term Goal 2 (Week 1): Pt will transfer bed<>chair w/ Mod A  PT Short Term Goal 3 (Week 1): Pt will self-propel w/c 175' w/ supervision PT Short Term Goal 4 (Week 1): Pt will maintain static sitting balance w/ supervision   Skilled Therapeutic Interventions/Progress Updates:    Pt supine in bed upon PT arrival, agreeable to therapy tx and reports pain in R distal limb 3/10. Pt transferred from supine>sitting EOB with moderate assist. Pt transferred from bed>power w/c with mod assist using slideboard and lateral scooting, facilitation for weightshifts. Pt performed power w/c mobility from room<>gym x 150 ft each way with supervision and verbal cues for setting up w/c for transfer. Pt performed slideboard transfer x 2 from w/c<>mat with mod-max assist, verbal cues for techniques working on transfer training. Pt left seated in w/c at end of session. BP 95/61 this session, asymptomatic.   Therapy Documentation Precautions:  Precautions Precautions: Fall Restrictions Weight Bearing Restrictions: Yes RLE Weight Bearing: Non weight bearing RLE Partial Weight Bearing Percentage or Pounds: BKA LLE Weight Bearing: Non weight bearing LLE Partial Weight Bearing Percentage or Pounds: BKA    See Function Navigator for Current Functional Status.   Therapy/Group: Individual Therapy  Cresenciano GenreEmily van Schagen, PT, DPT 03/19/2017, 7:47 AM

## 2017-03-19 NOTE — Progress Notes (Addendum)
Occupational Therapy Session Note  Patient Details  Name: Joshua KitchenJames D Gatta MRN: [id number] Date of Birth: 1941/02/13  Today's Date: 03/19/2017 OT Individual Time: 1105-1201 OT Individual Time Calculation (min): 56 min    Short Term Goals: Week 1:  OT Short Term Goal 1 (Week 1): Pt will transfer to drop arm BSC with mod assist with slide board. OT Short Term Goal 2 (Week 1): Pt will don pants in bed with min A. OT Short Term Goal 3 (Week 1): Pt will perform lateral leans on BSC with S to assist with clothing management. OT Short Term Goal 4 (Week 1): Pt will maintain sitting balance EOB during ADL completion with MinA OT Short Term Goal 5 (Week 1): Pt will perform bed mobility with MinA in preparation for EOB ADL completion.      Skilled Therapeutic Interventions/Progress Updates:    Tx focus on trunk control, activity tolerance, UB strength, and adaptive bathing/dressing skills.   Pt greeted reclined in power w/c at time of arrival. Significant other present. Pt engaging in bathing/dressing tasks w/c level at sink. Worked on trunk strengthening for short windows of time during UB bathing/dressing. Laterals leans completed with 2 helpers to remove LB clothing with active effort from pt. Utilized w/c controls as needed to recline chair in order for pt to scoot himself back with assist from gravity. Unable to thoroughly wash buttocks due to pts inability to lean enough to clear either thigh off of w/c. Lateral leans/rolling for elevating underwear/pants over hips with w/c reclined to semi-supine position (2 helpers for safety). Pt able to don overhead shirt with supervision and thread LEs into LB garments today! At session exit pt was left in w/c with slideboards positioned underneath cushion to promote extension of residual limbs, and significant other present.    Therapy Documentation Precautions:  Precautions Precautions: Fall Restrictions Weight Bearing Restrictions: Yes RLE Weight  Bearing: Non weight bearing RLE Partial Weight Bearing Percentage or Pounds: BKA LLE Weight Bearing: Non weight bearing LLE Partial Weight Bearing Percentage or Pounds: BKA  Pain: Pt reported pain to be manageable with rest breaks, medicated prior to session  Pain Assessment Pain Assessment: 0-10 Pain Score: 5  Pain Type: Acute pain Pain Location: Leg Pain Orientation: Right Pain Descriptors / Indicators: Stabbing Pain Frequency: Intermittent Pain Onset: On-going Patients Stated Pain Goal: 2 Pain Intervention(s): Medication (See eMAR) Multiple Pain Sites: No ADL:      See Function Navigator for Current Functional Status.   Therapy/Group: Individual Therapy  Birgitta Uhlir A Ysabella Babiarz 03/19/2017, 12:45 PM

## 2017-03-19 NOTE — Progress Notes (Signed)
Occupational Therapy Session Note  Patient Details  Name: [name] MRN: [id number] Date of Birth: May 22, 1940  Today's Date: 03/19/2017 OT Individual Time: 1300-1345 and 1515-1600 OT Individual Time Calculation (min): 45 min and 45 min   Short Term Goals: Week 1:  OT Short Term Goal 1 (Week 1): Pt will transfer to drop arm BSC with mod assist with slide board. OT Short Term Goal 2 (Week 1): Pt will don pants in bed with min A. OT Short Term Goal 3 (Week 1): Pt will perform lateral leans on BSC with S to assist with clothing management. OT Short Term Goal 4 (Week 1): Pt will maintain sitting balance EOB during ADL completion with MinA OT Short Term Goal 5 (Week 1): Pt will perform bed mobility with MinA in preparation for EOB ADL completion.   Skilled Therapeutic Interventions/Progress Updates:    1:1. Focus of session on attention, UB dressing, and PWC propulsion. Pt Doffs pull over shirt with VC for pulling shirt over back of head. Pt dons pull over shirt with A to pull over back and tactile cues to lean forward. Throughout session pt attention decreasing and eyelids intermittently drooping. Pt propels [place] throuhgout unit with VC to use targets on floor as "points" to steer towards to avoid running into object. D/t decreased attention, pt frequently veering toward walls despite visual/verbal cues. Exited session with pt seated in w/c advising pt to take nap prior to next session to improve attention.   Session 2: pt in [place] upon arrival. Pt requires cues for tilting forward and switching to drive mode. Pt powers w/c to tx gym and transfers throughout session PWC<>EOM>EOB with MAX A, VC for hand placement, manual facilitation forward and dycem under hand when transferring from mat as hands are slipping. Pt able to reciprocal scoot hips posteriorly on mat and in w/c with VC. Pt sits EOB leaning laterally to obtain cards and and place on vertical board in front of patient with anterior weight  shift with 1 LOB forward with min A to recover. Pt completes UB therex with emphasis on tricep extension to improve funtional transfers/butt clearance. Exited session with pt seated in bed with call light in reach and all needs met.  Therapy Documentation Precautions:  Precautions Precautions: Fall Restrictions Weight Bearing Restrictions: Yes RLE Weight Bearing: Non weight bearing RLE Partial Weight Bearing Percentage or Pounds: BKA LLE Weight Bearing: Non weight bearing LLE Partial Weight Bearing Percentage or Pounds: BKA  General:    See Function Navigator for Current Functional Status.   Therapy/Group: Individual Therapy  Tonny Branch 03/19/2017, 1:42 PM

## 2017-03-20 ENCOUNTER — Inpatient Hospital Stay (HOSPITAL_COMMUNITY): Payer: Medicare Other | Admitting: Occupational Therapy

## 2017-03-20 ENCOUNTER — Inpatient Hospital Stay (HOSPITAL_COMMUNITY): Payer: Medicare Other

## 2017-03-20 DIAGNOSIS — I951 Orthostatic hypotension: Secondary | ICD-10-CM

## 2017-03-20 LAB — GLUCOSE, CAPILLARY
GLUCOSE-CAPILLARY: 43 mg/dL — AB (ref 65–99)
GLUCOSE-CAPILLARY: 113 mg/dL — AB (ref 65–99)
GLUCOSE-CAPILLARY: 382 mg/dL — AB (ref 65–99)
Glucose-Capillary: 168 mg/dL — ABNORMAL HIGH (ref 65–99)
Glucose-Capillary: 133 mg/dL — ABNORMAL HIGH (ref 65–99)

## 2017-03-20 MED ORDER — GLUCOSE 40 % PO GEL
ORAL | Status: AC
  Administered 2017-03-20: 16:00:00
  Filled 2017-03-20: qty 1

## 2017-03-20 NOTE — Progress Notes (Signed)
Hypoglycemic Event  CBG: 43  Treatment: 1 tube instant glucose and 15 GM carbohydrate snack  Symptoms: Pale, Sweaty and Shaky  Follow-up CBG: Time:1543 CBG Result:113  Possible Reasons for Event: Inadequate meal intake  Comments/MD notified:Pamela Love notified. MD ordered snack and recheck in half an hour. If unsuccessful 1/2 amp of D50. Pt responded well to snack and D50 not needed.     [name] S

## 2017-03-20 NOTE — Progress Notes (Signed)
Occupational Therapy Session Note  Patient Details  Name: Joshua [name] MRN: [id number] Date of Birth: 1940-05-12  Today's Date: 03/20/2017 OT Individual Time: 4098-11911034-1115 OT Individual Time Calculation (min): 41 min    Short Term Goals: Week 1:  OT Short Term Goal 1 (Week 1): Pt will transfer to drop arm BSC with mod assist with slide board. OT Short Term Goal 2 (Week 1): Pt will don pants in bed with min A. OT Short Term Goal 3 (Week 1): Pt will perform lateral leans on BSC with S to assist with clothing management. OT Short Term Goal 4 (Week 1): Pt will maintain sitting balance EOB during ADL completion with MinA OT Short Term Goal 5 (Week 1): Pt will perform bed mobility with MinA in preparation for EOB ADL completion.   Skilled Therapeutic Interventions/Progress Updates:    Pt received supine in bed agreeable to OT tx session, currently still connected to PD machine. Pt had just completed bathing/dressing with spouse assist. Pt completes bed mobility with MinA and increased time/effort to sit EOB. Pt engages in seated pegboard activity facilitating forward lean outside BOS, reaching across midline to obtain and place items during task completion. Pt reports increased pain in buttocks region, educated Pt on pressure relief techniques leaning onto each elbow with close guard for safety when transitioning onto elbow and back into sitting. Practiced scooting along EOB for increased strengthening during slide board transfers, Pt scoots towards HOB with ModA to advance hips. Pt returns to supine with minguard assist. Ended session supine in bed, spouse present, call bell and needs within reach.   Therapy Documentation Precautions:  Precautions Precautions: Fall Restrictions Weight Bearing Restrictions: Yes RLE Weight Bearing: Non weight bearing RLE Partial Weight Bearing Percentage or Pounds: BKA LLE Weight Bearing: Non weight bearing LLE Partial Weight Bearing Percentage or Pounds: BKA     Pain: Pain Assessment Pain Assessment: 0-10 Pain Score: Asleep Pain Type: Acute pain Pain Location: Leg Pain Orientation: Right Pain Descriptors / Indicators: Sharp Pain Onset: On-going Patients Stated Pain Goal: 3 Pain Intervention(s): Medication (See eMAR)     See Function Navigator for Current Functional Status.   Therapy/Group: Individual Therapy  Orlando PennerBreanna L Sheneika Walstad 03/20/2017, 4:08 PM

## 2017-03-20 NOTE — Progress Notes (Signed)
Physical Therapy Session Note  Patient Details  Name: Joshua KitchenJames D Pop MRN: [id number] Date of Birth: 24-Apr-1940  Today's Date: 03/20/2017 PT Individual Time: 1115-1145 PT Individual Time Calculation (min): 30 min   Short Term Goals: Week 1:  PT Short Term Goal 1 (Week 1): Pt will perform bed mobility w/ Min A  PT Short Term Goal 2 (Week 1): Pt will transfer bed<>chair w/ Mod A  PT Short Term Goal 3 (Week 1): Pt will self-propel w/c 7375' w/ supervision PT Short Term Goal 4 (Week 1): Pt will maintain static sitting balance w/ supervision   Skilled Therapeutic Interventions/Progress Updates:    Pt supine in bed upon PT arrival, agreeable to therapy tx and reports pain 3/10. Pt transferred from supine>sitting EOB with mod assist and increased time to complete, verbal cues for technique. Pt transferred from bed>w/c with mod assist using slideboard, manual facilitation for weightshifting and verbal cues for techniques. Pt performed power w/c mobility with supervision, occasional cues to avoid obstacles. While going to the gym pt reported feeling light headed, vitals monitored with BP 86/40. Therapist reclined and tilted power w/c and brought pt back to his room. BP assessed again, 87/42 with patient reporting feeling better. RN and therapist suggested holding the reminder of therapy and encouraged pt to drink water. Pt left recliner in w/c with wife present.    Therapy Documentation Precautions:  Precautions Precautions: Fall Restrictions Weight Bearing Restrictions: Yes RLE Weight Bearing: Non weight bearing RLE Partial Weight Bearing Percentage or Pounds: BKA LLE Weight Bearing: Non weight bearing LLE Partial Weight Bearing Percentage or Pounds: BKA    See Function Navigator for Current Functional Status.   Therapy/Group: Individual Therapy  Cresenciano GenreEmily van Schagen, PT, DPT 03/20/2017, 12:01 PM

## 2017-03-20 NOTE — Progress Notes (Signed)
[facility]     PROGRESS NOTE  Subjective/Complaints:  Pt seen laying in bed this AM, talking on the phone.  Patient's wife states that he slept well overnight and had a good weekend overall.  ROS: Denies CP, SOB, nausea, vomiting, diarrhea.  Objective: Vital Signs: Blood pressure 138/73, pulse 80, temperature 98.4 F (36.9 C), temperature source Axillary, resp. rate 14, weight 45 kg (99 lb 3.3 oz), SpO2 100 %. No results found. Recent Labs    03/19/17 0603  WBC 8.9  HGB 8.8*  HCT 28.4*  PLT 468*   Recent Labs    03/19/17 0603  NA 129*  K 4.0  CL 92*  GLUCOSE 294*  BUN 63*  CREATININE 5.00*  CALCIUM 8.3*   CBG (last 3)  Recent Labs    03/19/17 1655 03/19/17 2145 03/20/17 0702  GLUCAP 72 201* 168*    Wt Readings from Last 3 Encounters:  03/20/17 45 kg (99 lb 3.3 oz)  03/14/17 75 kg (165 lb 5.5 oz)  03/12/17 78 kg (171 lb 15.3 oz)    Physical Exam:  BP 138/73 (BP Location: Right Arm)   Pulse 80   Temp 98.4 F (36.9 C) (Axillary)   Resp 14   Wt 45 kg (99 lb 3.3 oz) Comment: without the two boxes on the bed  SpO2 100%   BMI 14.65 kg/m  Constitutional: He appears well-developed and well-nourished.  HENT:  Head: Normocephalic.  Eyes: EOM are normal. No discharge.  Cardiovascular: RRR. No JVD. Respiratory: Effort normal and breath sounds normal.   GI: Bowel sounds are normal. He exhibits no distension.   Musculoskeletal: He exhibits edemaand tenderness.  Neurological: He is alert and oriented.  Motor: B/l UE 4+/5 proximal to distal RLE: HF 4/5 (pain inhibition, stable) LLE: HF 4/5 (pain inhibition, stable) Skin:  See above Left BKA c/d/i Right BKA with minimal drainage, ischemic changes posterior flap.   Assessment/Plan: 1. 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 Physiatrist is providing close team  supervision and 24 hour management of active medical problems listed below. Physiatrist and rehab team continue to assess barriers to discharge/monitor patient progress toward functional and medical goals.  Function:  Bathing Bathing position   Position: Wheelchair/chair at sink  Bathing parts Body parts bathed by patient: Right arm, Left arm, Abdomen, Chest, Front perineal area, Right upper leg, Left upper leg Body parts bathed by helper: Back  Bathing assist Assist Level: Touching or steadying assistance(Pt > 75%)      Upper Body Dressing/Undressing Upper body dressing   What is the patient wearing?: Pull over shirt/dress     Pull over shirt/dress - Perfomed by patient: Thread/unthread right sleeve, Thread/unthread left sleeve, Put head through opening, Pull shirt over trunk Pull over shirt/dress - Perfomed by helper: Put head through opening, Pull shirt over trunk        Upper body assist Assist Level: Supervision or verbal cues      Lower Body Dressing/Undressing Lower body dressing   What is the patient wearing?: Pants, Underwear Underwear - Performed by patient: Thread/unthread right underwear leg, Thread/unthread left underwear leg Underwear - Performed by helper: Pull underwear up/down Pants- Performed by patient: Thread/unthread right pants leg, Thread/unthread left pants leg Pants- Performed by helper: Pull pants up/down  Lower body assist Assist for lower body dressing: 2 Helpers      Toileting Toileting Toileting activity did not occur: No continent bowel/bladder event   Toileting steps completed by helper: Adjust clothing prior to toileting, Performs perineal hygiene, Adjust clothing after toileting Toileting Assistive Devices: Toilet aid  Toileting assist Assist level: Two helpers   Transfers Chair/bed transfer   Chair/bed transfer method: Lateral scoot Chair/bed transfer assist level: Maximal assist (Pt 25 - 49%/lift and  lower) Chair/bed transfer assistive device: Sliding board     Locomotion Ambulation Ambulation activity did not occur: N/A         Wheelchair   Type: Manual Max wheelchair distance: 150' Assist Level: Supervision or verbal cues  Cognition Comprehension Comprehension assist level: Understands basic 90% of the time/cues < 10% of the time  Expression Expression assist level: Expresses basic 90% of the time/requires cueing < 10% of the time.  Social Interaction Social Interaction assist level: Interacts appropriately 90% of the time - Needs monitoring or encouragement for participation or interaction.  Problem Solving Problem solving assist level: Solves basic 90% of the time/requires cueing < 10% of the time  Memory Memory assist level: Recognizes or recalls 90% of the time/requires cueing < 10% of the time    Medical Problem List and Plan: 1. 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 Bilateral wound vacs discontinued 03/05/2017 per [name]   Continue CIR 2. DVT Prophylaxis/Anticoagulation: Heparin per nephrology 3. Pain Management: Flexeril and Oxycodone as needed 4. Mood: Provide emotional support 5. Neuropsych: This patient is capable of making decisions on his own behalf. 6. Skin/Wound Care: Routine skin checks 7. Fluids/Electrolytes/Nutrition: Routine I&O's 8. Acute on chronic anemia. Continue Aranesp   Hemoglobin 8.8 on 12/9   Cont to monitor 9. End-stage renal disease/CPPD. Follow-up renal services 10. Diabetes mellitus peripheral neuropathy. Latest hemoglobin A1c 8.8.   Patient brittle diabetic with significant changes with mild adjustments to insulin   Check blood sugars before meals and at bedtime.    NPH insulin 65 units daily at breakfast, decreased to 55 on 12/7     NPH 50 units daily at bedtime, decreased to 45 on 12/6. Running high in am will increase to 48U   Sliding scale changed to sensitive   Overall improving  on 12/10  CBG (last 3)  Recent Labs    03/19/17 1655 03/19/17 2145 03/20/17 0702  GLUCAP 72 201* 168*   11. CAD with CABG. Continue Plavix and aspirin. No chest pain or shortness of breath 12. Diastolic congestive heart failure. Monitor for any signs of fluid overload Filed Weights   03/18/17 0647 03/19/17 0550 03/20/17 0555  Weight: 73.5 kg (162 lb) 70 kg (154 lb 5.2 oz) 45 kg (99 lb 3.3 oz)  13. SIRS. Resolved.   Antibiotics completed. Blood cultures no growth. Monitor for any fever  14. Hypertension. Coreg 12.5 mg twice daily Controlled 12/10    Vitals:   03/19/17 1444 03/20/17 0555  BP: (!) 108/53 138/73  Pulse: 86 80  Resp: 14 14  Temp: 98.6 F (37 C) 98.4 F (36.9 C)  SpO2: 100% 100%     Orthostatics hypertension-encourage fluids  15. New onset atrial fibrillation. Follow-up cardiology services. Coreg 12.5 mg twice daily 16. Hyperlipidemia. Lipitor 17. Constipation. Laxative assistance  LOS (Days) 6 A FACE TO FACE EVALUATION WAS PERFORMED  Ankit Karis Jubanil Patel 03/20/2017 8:45 AM

## 2017-03-21 ENCOUNTER — Inpatient Hospital Stay (HOSPITAL_COMMUNITY): Payer: Medicare Other | Admitting: Occupational Therapy

## 2017-03-21 ENCOUNTER — Inpatient Hospital Stay (HOSPITAL_COMMUNITY): Payer: Medicare Other | Admitting: Physical Therapy

## 2017-03-21 ENCOUNTER — Inpatient Hospital Stay (HOSPITAL_COMMUNITY): Payer: Medicare Other

## 2017-03-21 LAB — GLUCOSE, CAPILLARY
GLUCOSE-CAPILLARY: 206 mg/dL — AB (ref 65–99)
Glucose-Capillary: 443 mg/dL — ABNORMAL HIGH (ref 65–99)
Glucose-Capillary: 181 mg/dL — ABNORMAL HIGH (ref 65–99)
Glucose-Capillary: 68 mg/dL (ref 65–99)
Glucose-Capillary: 72 mg/dL (ref 65–99)

## 2017-03-21 MED ORDER — POLYETHYLENE GLYCOL 3350 17 G PO PACK
17.0000 g | PACK | Freq: Every day | ORAL | Status: DC | PRN
  Administered 2017-03-24 – 2017-03-25 (×2): 17 g via ORAL
  Filled 2017-03-21: qty 1

## 2017-03-21 MED ORDER — CARVEDILOL 6.25 MG PO TABS
6.2500 mg | ORAL_TABLET | Freq: Two times a day (BID) | ORAL | Status: DC
  Administered 2017-03-21 – 2017-03-22 (×2): 6.25 mg via ORAL
  Filled 2017-03-21 (×3): qty 1

## 2017-03-21 MED ORDER — INSULIN NPH (HUMAN) (ISOPHANE) 100 UNIT/ML ~~LOC~~ SUSP
45.0000 [IU] | Freq: Every day | SUBCUTANEOUS | Status: DC
  Administered 2017-03-22 – 2017-03-25 (×4): 45 [IU] via SUBCUTANEOUS

## 2017-03-21 MED ORDER — INSULIN ASPART 100 UNIT/ML ~~LOC~~ SOLN
0.0000 [IU] | Freq: Three times a day (TID) | SUBCUTANEOUS | Status: DC
  Administered 2017-03-22: 4 [IU] via SUBCUTANEOUS
  Administered 2017-03-23: 2 [IU] via SUBCUTANEOUS
  Administered 2017-03-23 – 2017-03-25 (×3): 4 [IU] via SUBCUTANEOUS
  Administered 2017-03-25 – 2017-03-27 (×2): 2 [IU] via SUBCUTANEOUS

## 2017-03-21 NOTE — Progress Notes (Signed)
Physical Therapy Session Note  Patient Details  Name: TURKI TAPANES MRN: [id number] Date of Birth: [birth year]  Today's Date: 03/21/2017 PT Individual Time: 0321-2248 PT Individual Time Calculation (min): 60 min   Short Term Goals: Week 1:  PT Short Term Goal 1 (Week 1): Pt will perform bed mobility w/ Min A  PT Short Term Goal 2 (Week 1): Pt will transfer bed<>chair w/ Mod A  PT Short Term Goal 3 (Week 1): Pt will self-propel w/c 34' w/ supervision PT Short Term Goal 4 (Week 1): Pt will maintain static sitting balance w/ supervision   Skilled Therapeutic Interventions/Progress Updates:   Pt in w/c upon arrival and agreeable to therapy, no c/o pain. Per RN report, pt having continued BP issues but ok to continue w/ therapy. BP WNL in tilted w/c position, 99/65 sitting upright, asymptomatic. Pt steering motorized w/c to/from gym w/ supervision and verbal cues for safety and environmental awareness. Performed UE strengthening exercises while seated in w/c as listed below w/ emphasis on triceps strength for scooting and pressure relief. BP 113/45 after 20 minutes of exercise, no c/o dizziness. Pt began to c/o dizziness ~40 minutes into exercise, BP 102/43. Pt tilted back in w/c, w/ some relief of dizziness. Returned to room in w/c and transferred to EOB w/ mod assist via slide board transport going downhill and to supine w/ min assist. Pt reports dizziness continuing, however subsiding slowly in supine. RN made aware, missed 15 minutes of skilled PT 2/2 low BP and symptomatic. Ended session in supine and in care of wife, all needs met.   BUE strengthening exercises: -triceps pull out w/ green TB 3x10  -backward scooting w/ elbows on armrests 2x5 -UE dips w/ elbows on armrests, 3x10  -beach ball toss w/ 1# weighted dowel rod, 3 min  Therapy Documentation Precautions:  Precautions Precautions: Fall Restrictions Weight Bearing Restrictions: Yes RLE Weight Bearing: Non weight bearing RLE  Partial Weight Bearing Percentage or Pounds: BKA LLE Weight Bearing: Non weight bearing LLE Partial Weight Bearing Percentage or Pounds: BKA  Vital Signs: Therapy Vitals Temp: 98.4 F (36.9 C) Temp Source: Oral Pulse Rate: 68 Resp: 16 BP: (!) 102/43 Patient Position (if appropriate): Sitting Oxygen Therapy SpO2: 99 % O2 Device: Not Delivered  See Function Navigator for Current Functional Status.   Therapy/Group: Individual Therapy  Krista Som K Arnette 03/21/2017, 3:18 PM

## 2017-03-21 NOTE — Progress Notes (Signed)
[facility] Progress Note   Subjective:   Doing well, no SOB or swelling    Vitals:   03/21/17 0531 03/21/17 0845 03/21/17 0900 03/21/17 1333  BP: (!) 121/46 (!) 119/48 (!) 118/51 (!) 110/44  Pulse: 88  67   Resp: 16  15   Temp: 97.8 F (36.6 C)  98.6 F (37 C)   TempSrc: Axillary  Oral   SpO2:   99%   Weight: 48 kg (105 lb 13.1 oz)  72 kg (158 lb 11.7 oz)     Inpatient medications: . aspirin EC  81 mg Oral Daily  . atorvastatin  80 mg Oral q1800  . calcium acetate  667 mg Oral TID WC  . carvedilol  6.25 mg Oral BID WC  . chlorhexidine  15 mL Mouth/Throat BID  . clopidogrel  75 mg Oral Daily  . darbepoetin (ARANESP) injection - NON-DIALYSIS  200 mcg Subcutaneous Q Sat-1800  . docusate sodium  100 mg Oral BID  . feeding supplement (NEPRO CARB STEADY)  237 mL Oral BID BM  . feeding supplement (PRO-STAT SUGAR FREE 64)  30 mL Oral BID  . gentamicin cream  1 application Topical Daily  . insulin aspart  0-8 Units Subcutaneous TID WC  . [START ON 03/22/2017] insulin NPH Human  45 Units Subcutaneous QAC breakfast  . insulin NPH Human  48 Units Subcutaneous QHS  . multivitamin  1 tablet Oral Daily  . polyethylene glycol  17 g Oral Daily  . [START ON 03/30/2017] Vitamin D (Ergocalciferol)  50,000 Units Oral Q30 days   . dialysis solution 2.5% low-MG/low-CA     acetaminophen **OR** acetaminophen, heparin, nitroGLYCERIN, oxyCODONE, sorbitol  Exam: General:  Alert ,NAD, OX3 appropriate  Heart: RRR; No m, r, g Lungs: CTA bilat anteriorly, diminished at the bases. Abdomen: soft, non-tender., non distended, PD cath in place. Extremities: L BKA and new R BKA. No stump edema, no hip edema Dialysis Access: PD cath, L AVF + thrill, bruit not developed well   [facility] 6 exchanges/day one mid-day exchange (occurs around 9am).  2.5 liters of 2.5% per [facility] 2 liters of 2.5% for last fill and mid day drain. Dr. Denny PeonBaveja.   -baseline  weight for him pre amputation was around 190lbs   Assessment/ Plan:    1. PAD sp revision L BKA and new R BKA on 11/21 2. Vol - stable, euvolemic on exam, no edema 3. DM2 - per primary 4. Afib with RVR - per primary. 5. [place] [facility] [place]  6. Hypokalemia - better, sp supplementation. 7. HTN -  BP controlled coreg 6.25mg  bid 8. MBD of CKD - holding phoslo for lowish P. No Vit D. Follow trend  9. UTI - blood cx's neg, no urine cx in system. Sp short course abx. 10. Malnutrition - alb 1.3   Prostat, +Nepro.  11. Anemia of CKD - Darbe increased to 200 mcg q week    Plan - cont [facility] with all 2.5% fluids   Vinson Moselleob Danyetta Gillham MD [facility] pager [phone]   03/21/2017, 1:43 PM   Recent Labs  Lab 03/15/17 0643 03/19/17 0603  NA 132* 129*  K 3.6 4.0  CL 94* 92*  CO2 26 27  GLUCOSE 501* 294*  BUN 44* 63*  CREATININE 5.55* 5.00*  CALCIUM 8.7* 8.3*  PHOS  --  3.6   Recent Labs  Lab 03/15/17 0643 03/19/17 0603  AST 67*  --   ALT 64*  --  ALKPHOS 101  --   BILITOT 0.3  --   PROT 5.7*  --   ALBUMIN 1.5* 1.5*   Recent Labs  Lab 03/15/17 0643 03/19/17 0603  WBC 8.9 8.9  NEUTROABS 6.1  --   HGB 9.2* 8.8*  HCT 29.6* 28.4*  MCV 96.1 94.7  PLT 492* 468*   Iron/TIBC/Ferritin/ %Sat    Component Value Date/Time   IRON 28 (L) 02/02/2017 0705   TIBC 158 (L) 02/02/2017 0705   FERRITIN 2,284 (H) 12/31/2016 0602   IRONPCTSAT 18 02/02/2017 [id number]

## 2017-03-21 NOTE — Progress Notes (Signed)
[place] PHYSICAL MEDICINE & REHABILITATION     PROGRESS NOTE  Subjective/Complaints:  Pt seen laying in bed this AM.  He slept well overnight.  Wife at bedside states pt is improving.   ROS: Denies CP, SOB, nausea, vomiting, diarrhea.  Objective: Vital Signs: Blood pressure (!) 121/46, pulse 88, temperature 97.8 F (36.6 C), temperature source Axillary, resp. rate 16, weight 48 kg (105 lb 13.1 oz), SpO2 93 %. No results found. Recent Labs    03/19/17 0603  WBC 8.9  HGB 8.8*  HCT 28.4*  PLT 468*   Recent Labs    03/19/17 0603  NA 129*  K 4.0  CL 92*  GLUCOSE 294*  BUN 63*  CREATININE 5.00*  CALCIUM 8.3*   CBG (last 3)  Recent Labs    03/20/17 1643 03/20/17 2102 03/21/17 0618  GLUCAP 113* 382* 443*    Wt Readings from Last 3 Encounters:  03/21/17 48 kg (105 lb 13.1 oz)  03/14/17 75 kg (165 lb 5.5 oz)  03/12/17 78 kg (171 lb 15.3 oz)    Physical Exam:  BP (!) 121/46 (BP Location: Right Arm)   Pulse 88   Temp 97.8 F (36.6 C) (Axillary)   Resp 16   Wt 48 kg (105 lb 13.1 oz)   SpO2 93%   BMI 15.63 kg/m  Constitutional: He appears well-developed and well-nourished.  HENT:  Head: Normocephalic.  Eyes: EOM are normal. No discharge.  Cardiovascular: RRR. No JVD. Respiratory: Effort normal and breath sounds normal.   GI: Bowel sounds are normal. He exhibits no distension.   Musculoskeletal: He exhibits edemaand tenderness.  Neurological: He is alert and oriented.  Motor: B/l UE 4+/5 proximal to distal RLE: HF 4/5 (pain inhibition, stable) LLE: HF 4/5 (pain inhibition, improving) Skin:  See above Left BKA with dressing c/d/i Right BKA with dressing c/d/i  Assessment/Plan: 1. 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 Physiatrist is providing close team supervision and 24 hour management of active medical problems listed below. Physiatrist  and rehab team continue to assess barriers to discharge/monitor patient progress toward functional and medical goals.  Function:  Bathing Bathing position   Position: Wheelchair/chair at sink  Bathing parts Body parts bathed by patient: Right arm, Left arm, Abdomen, Chest, Front perineal area, Right upper leg, Left upper leg Body parts bathed by helper: Back  Bathing assist Assist Level: Touching or steadying assistance(Pt > 75%)      Upper Body Dressing/Undressing Upper body dressing   What is the patient wearing?: Pull over shirt/dress     Pull over shirt/dress - Perfomed by patient: Thread/unthread right sleeve, Thread/unthread left sleeve, Put head through opening, Pull shirt over trunk Pull over shirt/dress - Perfomed by helper: Put head through opening, Pull shirt over trunk        Upper body assist Assist Level: Supervision or verbal cues      Lower Body Dressing/Undressing Lower body dressing   What is the patient wearing?: Pants, Underwear Underwear - Performed by patient: Thread/unthread right underwear leg, Thread/unthread left underwear leg Underwear - Performed by helper: Pull underwear up/down Pants- Performed by patient: Thread/unthread right pants leg, Thread/unthread left pants leg Pants- Performed by helper: Pull pants up/down                      Lower body assist Assist for lower body dressing: 2 Helpers  Toileting Toileting Toileting activity did not occur: No continent bowel/bladder event   Toileting steps completed by helper: Adjust clothing prior to toileting, Performs perineal hygiene, Adjust clothing after toileting Toileting Assistive Devices: Toilet aid  Toileting assist Assist level: Two helpers   Transfers Chair/bed transfer   Chair/bed transfer method: Lateral scoot Chair/bed transfer assist level: Maximal assist (Pt 25 - 49%/lift and lower) Chair/bed transfer assistive device: Sliding board     Locomotion Ambulation  Ambulation activity did not occur: N/A         Wheelchair   Type: Manual Max wheelchair distance: 150' Assist Level: Supervision or verbal cues  Cognition Comprehension Comprehension assist level: Understands basic 90% of the time/cues < 10% of the time  Expression Expression assist level: Expresses basic 90% of the time/requires cueing < 10% of the time.  Social Interaction Social Interaction assist level: Interacts appropriately 90% of the time - Needs monitoring or encouragement for participation or interaction.  Problem Solving Problem solving assist level: Solves basic 90% of the time/requires cueing < 10% of the time  Memory Memory assist level: Recognizes or recalls 90% of the time/requires cueing < 10% of the time    Medical Problem List and Plan: 1. 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 Bilateral wound vacs discontinued 03/05/2017 per [name]   Continue CIR 2. DVT Prophylaxis/Anticoagulation: Heparin per nephrology 3. Pain Management: Flexeril and Oxycodone as needed 4. Mood: Provide emotional support 5. Neuropsych: This patient is capable of making decisions on his own behalf. 6. Skin/Wound Care: Routine skin checks 7. Fluids/Electrolytes/Nutrition: Routine I&O's 8. Acute on chronic anemia. Continue Aranesp   Hemoglobin 8.8 on 12/9   Cont to monitor 9. End-stage renal disease/CPPD. Follow-up renal services 10. Diabetes mellitus peripheral neuropathy. Latest hemoglobin A1c 8.8.   Patient brittle diabetic with significant changes with mild adjustments to insulin   Check blood sugars before meals and at bedtime.    NPH insulin 65 units daily at breakfast, decreased to 55 on 12/7, decreased to 45 on 12/11    NPH increased to 48U on 12/9   Sliding scale changed to sensitive   Extremely labile with hypoglycemia yesterday  CBG (last 3)  Recent Labs    03/20/17 1643 03/20/17 2102 03/21/17 0618  GLUCAP 113* 382* 443*    11. CAD with CABG. Continue Plavix and aspirin. No chest pain or shortness of breath 12. Diastolic congestive heart failure. Monitor for any signs of fluid overload Filed Weights   03/19/17 0550 03/20/17 0555 03/21/17 0531  Weight: 70 kg (154 lb 5.2 oz) 45 kg (99 lb 3.3 oz) 48 kg (105 lb 13.1 oz)  13. SIRS. Resolved.   Antibiotics completed. Blood cultures no growth. Monitor for any fever  14. Hypertension. Coreg 12.5 mg twice daily Controlled 12/11    Vitals:   03/20/17 1412 03/21/17 0531  BP: 93/63 (!) 121/46  Pulse: 79 88  Resp: 12 16  Temp: 98.5 F (36.9 C) 97.8 F (36.6 C)  SpO2: 93%      Orthostatics hypertension-encourage fluids  15. New onset atrial fibrillation. Follow-up cardiology services. Coreg 12.5 mg twice daily 16. Hyperlipidemia. Lipitor 17. Constipation. Laxative assistance  LOS (Days) 7 A FACE TO FACE EVALUATION WAS PERFORMED  Ankit Karis Jubanil Patel 03/21/2017 8:11 AM

## 2017-03-21 NOTE — Progress Notes (Signed)
Physical Therapy Session Note  Patient Details  Name: Joshua [name] MRN: [id number] Date of Birth: 1940/09/26  Today's Date: 03/21/2017 PT Individual Time: 1040-1140 PT Individual Time Calculation (min): 60 min   Short Term Goals: Week 1:  PT Short Term Goal 1 (Week 1): Pt will perform bed mobility w/ Min A  PT Short Term Goal 2 (Week 1): Pt will transfer bed<>chair w/ Mod A  PT Short Term Goal 3 (Week 1): Pt will self-propel w/c 2975' w/ supervision PT Short Term Goal 4 (Week 1): Pt will maintain static sitting balance w/ supervision   Skilled Therapeutic Interventions/Progress Updates:    Pt seated in w/c upon PT arrival, agreeable to therapy tx and reports pain 4/10 in distal LEs. Pt performed power w/c mobility throughout unit with supervision, slow speed setting. Pt set up power chair for car transfer. Pt completed car transfer x 1 with therapist requiring mod assist, verbal and manual facilitation for weightshifting, and x 1 with his wife assisting. Pt reported feeling dizzy/fatigued after second transfers, vitals stable: BP 110/72, HR 100 and Spo2 98%. Pt seated in w/c worked on leaning forward to reach for and throw horse shoes while also working on dynamic seated balance. Pt performed 2 x 5 forwards leans with emphasis on core activation and anterior weightshifting. Pt worked on scooting forwards/backwards in his chair, emphasis on weightshifting to Lehman Brothersunweight one side of his hips. Pt left reclined in power chair at end of session for emphasis on pressure relief.   Therapy Documentation Precautions:  Precautions Precautions: Fall Restrictions Weight Bearing Restrictions: Yes RLE Weight Bearing: Non weight bearing RLE Partial Weight Bearing Percentage or Pounds: BKA LLE Weight Bearing: Non weight bearing LLE Partial Weight Bearing Percentage or Pounds: BKA    See Function Navigator for Current Functional Status.   Therapy/Group: Individual Therapy  Cresenciano GenreEmily van Schagen, PT,  DPT 03/21/2017, 7:52 AM

## 2017-03-21 NOTE — Progress Notes (Signed)
Occupational Therapy Session Note  Patient Details  Name: Joshua KitchenJames D Schwartzman MRN: [id number] Date of Birth: Apr 04, 1941  Today's Date: 03/21/2017 OT Individual Time: 0903-1001 OT Individual Time Calculation (min): 58 min    Short Term Goals: Week 1:  OT Short Term Goal 1 (Week 1): Pt will transfer to drop arm BSC with mod assist with slide board. OT Short Term Goal 2 (Week 1): Pt will don pants in bed with min A. OT Short Term Goal 3 (Week 1): Pt will perform lateral leans on BSC with S to assist with clothing management. OT Short Term Goal 4 (Week 1): Pt will maintain sitting balance EOB during ADL completion with MinA OT Short Term Goal 5 (Week 1): Pt will perform bed mobility with MinA in preparation for EOB ADL completion.   Skilled Therapeutic Interventions/Progress Updates:    Pt completed bathing and dressing sit to supine during session.  He needed mod assist for initial transition to sitting on the EOB.  Once sitting he was able to maintain sitting balance with UE selfcare, but still demonstrates posterior lean with resistance to forward trunk flexion.  He transitioned to supine for washing peri area and donning new LB clothing.  He needed mod demonstrational cueing and min assist for rolling with use of the rails.  Mod assist for transitions back to sitting position to complete applying deodorant and pullover shirt.  Max assist needed for sliding board transfer to the power chair with pt having to tilt the chair to scoot his hips all the way back, once sitting in the chair.  Finished session with pt completing shaving at the sink with supervision.  Pt left up in power wheelchair with his wife present and call button and phone in reach.    Therapy Documentation Precautions:  Precautions Precautions: Fall Restrictions Weight Bearing Restrictions: Yes RLE Weight Bearing: Non weight bearing RLE Partial Weight Bearing Percentage or Pounds: BKA LLE Weight Bearing: Non weight bearing LLE  Partial Weight Bearing Percentage or Pounds: BKA   Vital Signs: Therapy Vitals Temp: 98.6 F (37 C) Temp Source: Oral Pulse Rate: 67 Resp: 15 BP: (!) 118/51 Patient Position (if appropriate): Lying Oxygen Therapy SpO2: 99 % O2 Device: Not Delivered Pain: Pain Assessment Pain Assessment: Faces Pain Score: 2  Faces Pain Scale: Hurts a little bit Pain Type: Acute pain Pain Location: Leg Pain Orientation: Right Pain Descriptors / Indicators: Discomfort Pain Onset: On-going Pain Intervention(s): Repositioned ADL: See Function Navigator for Current Functional Status.   Therapy/Group: Individual Therapy  Sherral Dirocco,[place] OTR/L 03/21/2017, 12:39 PM

## 2017-03-22 ENCOUNTER — Inpatient Hospital Stay (HOSPITAL_COMMUNITY): Payer: Medicare Other | Admitting: Occupational Therapy

## 2017-03-22 ENCOUNTER — Inpatient Hospital Stay (HOSPITAL_COMMUNITY): Payer: Medicare Other

## 2017-03-22 DIAGNOSIS — E1165 Type 2 diabetes mellitus with hyperglycemia: Secondary | ICD-10-CM

## 2017-03-22 DIAGNOSIS — R000 Tachycardia, unspecified: Secondary | ICD-10-CM

## 2017-03-22 LAB — GLUCOSE, CAPILLARY
GLUCOSE-CAPILLARY: 140 mg/dL — AB (ref 65–99)
GLUCOSE-CAPILLARY: 247 mg/dL — AB (ref 65–99)
Glucose-Capillary: 251 mg/dL — ABNORMAL HIGH (ref 65–99)
Glucose-Capillary: 135 mg/dL — ABNORMAL HIGH (ref 65–99)
Glucose-Capillary: 159 mg/dL — ABNORMAL HIGH (ref 65–99)

## 2017-03-22 NOTE — Progress Notes (Signed)
Physical Therapy Note  Patient Details  Name: Joshua [name] MRN: [id number] Date of Birth: 15-Feb-1941 Today's Date: 03/22/2017  1130-1206, 36 min individual tx; missed 9 min tx due to hypotension and pain.  Pain: 6/10 R residual limb; premedicated and unable to have more meds until afternoon, per Whitney, RN  BP in supine at rest = 97/45, HR 59.  Pt willing to perform bedside tx. Therapeutic exercise performed with LE to increase strength for functional mobility: in supine- 10 x 2 L short arc quad knee ext, bridging with towel rolls under thighs, 10 x 1 R/L quad sets. Educated pt on benefits of elevating hips or upper body to scoot laterally in bed without use of rails.  Pt scooted R sequentially with min tactile cues before rolling L.    In L side lying- 10 x 2 R hip ext,clam shells.  Pt left resting in bed with all needs within reach; wife setting up his lunch tray.  Rodric Punch 03/22/2017, 10:15 AM

## 2017-03-22 NOTE — Progress Notes (Addendum)
[place] PHYSICAL MEDICINE & REHABILITATION     PROGRESS NOTE  Subjective/Complaints:  Patient seen sitting up in bed this morning. He slept well overnight. Nursing with concerns about tachycardia.  ROS: Denies CP, SOB, nausea, vomiting, diarrhea.  Objective: Vital Signs: Blood pressure (!) 126/48, pulse 96, temperature 98.4 F (36.9 C), temperature source Axillary, resp. rate 20, weight 72 kg (158 lb 11.7 oz), SpO2 99 %. No results found. No results for input(s): WBC, HGB, HCT, PLT in the last 72 hours. No results for input(s): NA, K, CL, GLUCOSE, BUN, CREATININE, CALCIUM in the last 72 hours.  Invalid input(s): CO CBG (last 3)  Recent Labs    03/21/17 1734 03/21/17 2046 03/22/17 0636  GLUCAP 72 206* 251*    Wt Readings from Last 3 Encounters:  03/21/17 72 kg (158 lb 11.7 oz)  03/14/17 75 kg (165 lb 5.5 oz)  03/12/17 78 kg (171 lb 15.3 oz)    Physical Exam:  BP (!) 126/48   Pulse 96   Temp 98.4 F (36.9 C) (Axillary)   Resp 20   Wt 72 kg (158 lb 11.7 oz)   SpO2 99%   BMI 23.44 kg/m  Constitutional: He appears well-developed and well-nourished.  HENT:  Head: Normocephalic.  Eyes: EOM are normal. No discharge.  Cardiovascular: Regular rhythm. Tachycardia.[name]. No JVD. Respiratory: Effort normal and breath sounds normal.   GI: Bowel sounds are normal. He exhibits no distension.   Musculoskeletal: He exhibits edemaand tenderness.  Neurological: He is alert and oriented.  Motor: B/l UE 4+/5 proximal to distal RLE: HF 4/5 (pain inhibition, improving) LLE: HF 4/5 (pain inhibition, improving) Skin:  See above Left BKA with dressing c/d/i Right BKA with dressing c/d/i  Assessment/Plan: 1. 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 Physiatrist is providing close team supervision and 24 hour management of active medical problems listed below. Physiatrist and  rehab team continue to assess barriers to discharge/monitor patient progress toward functional and medical goals.  Function:  Bathing Bathing position   Position: Sitting EOB  Bathing parts Body parts bathed by patient: Right arm, Left arm, Chest, Abdomen, Front perineal area, Buttocks, Right upper leg Body parts bathed by helper: Back  Bathing assist Assist Level: Touching or steadying assistance(Pt > 75%)      Upper Body Dressing/Undressing Upper body dressing   What is the patient wearing?: Pull over shirt/dress     Pull over shirt/dress - Perfomed by patient: Thread/unthread right sleeve, Thread/unthread left sleeve, Put head through opening, Pull shirt over trunk Pull over shirt/dress - Perfomed by helper: Put head through opening, Pull shirt over trunk        Upper body assist Assist Level: Supervision or verbal cues      Lower Body Dressing/Undressing Lower body dressing   What is the patient wearing?: Pants, Underwear Underwear - Performed by patient: Thread/unthread right underwear leg, Thread/unthread left underwear leg, Pull underwear up/down Underwear - Performed by helper: Pull underwear up/down Pants- Performed by patient: Thread/unthread right pants leg, Thread/unthread left pants leg, Pull pants up/down Pants- Performed by helper: Pull pants up/down                      Lower body assist Assist for lower body dressing: 2 Helpers      Toileting Toileting Toileting activity did not occur: No continent bowel/bladder event   Toileting steps completed by helper: Adjust  clothing prior to toileting, Performs perineal hygiene, Adjust clothing after toileting Toileting Assistive Devices: Toilet aid  Toileting assist Assist level: Two helpers   Transfers Chair/bed transfer   Chair/bed transfer method: Lateral scoot Chair/bed transfer assist level: Moderate assist (Pt 50 - 74%/lift or lower) Chair/bed transfer assistive device: Sliding board      Locomotion Ambulation Ambulation activity did not occur: N/A         Wheelchair   Type: Motorized Max wheelchair distance: 150' Assist Level: Supervision or verbal cues  Cognition Comprehension Comprehension assist level: Understands basic 90% of the time/cues < 10% of the time  Expression Expression assist level: Expresses basic 90% of the time/requires cueing < 10% of the time.  Social Interaction Social Interaction assist level: Interacts appropriately 90% of the time - Needs monitoring or encouragement for participation or interaction.  Problem Solving Problem solving assist level: Solves basic 75 - 89% of the time/requires cueing 10 - 24% of the time  Memory Memory assist level: Recognizes or recalls 75 - 89% of the time/requires cueing 10 - 24% of the time    Medical Problem List and Plan: 1. 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 Bilateral wound vacs discontinued 03/05/2017 per [name]   Continue CIR   Right BKA evaluated by or so, appreciate recs, continue current management.  I have seen the patient for a mobility assessment for a power wheelchair. I have reviewed and concur with PT evaluation. Patient needs power wheelchair with tilt due to patient is inability to pressure relieve. 2. DVT Prophylaxis/Anticoagulation: Heparin per nephrology 3. Pain Management: Flexeril and Oxycodone as needed 4. Mood: Provide emotional support 5. Neuropsych: This patient is capable of making decisions on his own behalf. 6. Skin/Wound Care: Routine skin checks 7. Fluids/Electrolytes/Nutrition: Routine I&O's 8. Acute on chronic anemia. Continue Aranesp   Hemoglobin 8.8 on 12/9   Labs ordered for tomorrow   Cont to monitor 9. End-stage renal disease/CPPD. Follow-up renal services 10. Diabetes mellitus peripheral neuropathy. Latest hemoglobin A1c 8.8.   Patient brittle diabetic with significant changes with mild adjustments to insulin    Check blood sugars before meals and at bedtime.    NPH insulin 65 units daily at breakfast, decreased to 55 on 12/7, decreased to 45 on 12/11    NPH increased to 48U on 12/9   Sliding scale changed to sensitive   Remains labile, however improved from previous day  CBG (last 3)  Recent Labs    03/21/17 1734 03/21/17 2046 03/22/17 0636  GLUCAP 72 206* 251*   11. CAD with CABG. Continue Plavix and aspirin. No chest pain or shortness of breath 12. Diastolic congestive heart failure. Monitor for any signs of fluid overload Filed Weights   03/21/17 0531 03/21/17 0900 03/21/17 1546  Weight: 48 kg (105 lb 13.1 oz) 72 kg (158 lb 11.7 oz) 72 kg (158 lb 11.7 oz)  13. SIRS. Resolved.   Antibiotics completed. Blood cultures no growth. Monitor for any fever  14. Hypertension. Coreg 12.5 mg twice daily Controlled 12/12    Vitals:   03/22/17 0638 03/22/17 0801  BP: 137/60 (!) 126/48  Pulse: 96   Resp: 20   Temp: 98.4 F (36.9 C)   SpO2: 99%      Orthostatics hypertension-encourage fluids  15. New onset atrial fibrillation. Follow-up cardiology services. Coreg 12.5 mg twice daily 16. Hyperlipidemia. Lipitor 17. Constipation. Laxative assistance  LOS (Days) 8 A FACE TO FACE EVALUATION WAS  PERFORMED  Ankit Karis Juba 03/22/2017 8:51 AM

## 2017-03-22 NOTE — Progress Notes (Signed)
Patient ID: Joshua KitchenJames D Furber, male   DOB: 06-10-1940, 76 y.o.   MRN: [id number] Patient status post bilateral transtibial amputations.  Examination the left leg incision is well approximated healing well no complicating issues.  Examination the right leg there is superficial ischemic changes with epithelialization around the wound edges no wound dehiscence no cellulitis no abscess no signs of infection.  Will have the staples and sutures harvested today.  He is to continue with the stump shrinker's.

## 2017-03-22 NOTE — Progress Notes (Signed)
Social Work   Konrad Hoak, Elveria Rising  Social Worker  Physical Medicine and Rehabilitation  Patient Care Conference  Signed  Date of Service:  03/22/2017  2:39 PM          Signed          [] Hide copied text  [] Hover for details   Inpatient RehabilitationTeam Conference and Plan of Care Update Date: 03/22/2017   Time: 2:15 PM      Patient Name: [name]      Medical Record Number: [id number]  Date of Birth: Jan 19, 1941 Sex: Male         Room/Bed: 4M08C/4M08C-01 Payor Info: Payor: MEDICARE / Plan: MEDICARE PART A AND B / Product Type: *No Product type* /     Admitting Diagnosis: B BKA  Admit Date/Time:  03/14/2017  3:12 PM Admission Comments: No comment available    Primary Diagnosis:  <principal problem not specified> Principal Problem: <principal problem not specified>       Patient Active Problem List    Diagnosis Date Noted  . Tachycardia    . Type 2 diabetes mellitus with peripheral neuropathy (HCC)    . New onset atrial fibrillation (HCC)    . Confusion 03/12/2017  . Loose stools    . Essential hypertension    . Acute combined systolic and diastolic congestive heart failure (HCC)    . Poorly controlled type 2 diabetes mellitus with peripheral neuropathy (HCC)    . S/P bilateral below knee amputation (HCC) 03/06/2017  . Constipation due to pain medication    . Coronary artery disease involving native coronary artery of native heart without angina pectoris    . Hyperlipidemia    . S/P unilateral BKA (below knee amputation), right (HCC)    . SIRS (systemic inflammatory response syndrome) (HCC) 03/05/2017  . S/P bilateral BKA (below knee amputation) (HCC) 03/01/2017  . Gangrene of right foot (HCC) 02/23/2017  . Blood in stool    . Wound dehiscence    . Rupture of operation wound    . Toe amputation status, right (HCC)    . Pressure injury of head, stage 3 (HCC)    . Poor nutrition    . Brittle diabetes (HCC)    . Adjustment disorder with  depressed mood    . Hypoglycemia    . Ischemic pain of right foot    . Arterial hypotension    . Diabetes mellitus type 2 in obese (HCC)    . PVD (peripheral vascular disease) (HCC)    . Delirium    . History of left below knee amputation (HCC) 01/17/2017  . Acute blood loss anemia    . Elevated AST (SGOT) 01/09/2017  . Chest pain 01/09/2017  . NSTEMI (non-ST elevated myocardial infarction) (HCC) 01/09/2017  . Hypokalemia 01/07/2017  . Hypomagnesemia 01/07/2017  . Left foot infection 01/06/2017  . Chronic hypotension 01/06/2017  . 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 IDDM (insulin dependent diabetes mellitus) (HCC) 01/06/2017  . Foot infection 01/06/2017  . Labile blood pressure    . Hallucination    . Benign essential HTN    . Ulcer of toe of right foot (HCC)    . Ulcer of external ear, limited to breakdown of skin (HCC)    . Chronic congestive heart failure (HCC)    . Anemia of chronic disease    . Labile blood glucose    . Uncontrolled diabetes mellitus type 2 with peripheral artery disease (HCC)    .  Diabetic peripheral neuropathy (HCC)    . Debility 12/27/2016  . History of transmetatarsal amputation of left foot (HCC) 12/27/2016  . Constipation    . Chronic obstructive pulmonary disease (HCC)    . Nausea    . ESRD on dialysis (HCC)    . Leukocytosis    . Tachypnea    . FUO (fever of unknown origin)    . Encephalopathy 12/23/2016  . Sore throat 12/23/2016  . Gangrene of left foot (HCC) 11/26/2016  . PAD (peripheral artery disease) (HCC) 11/26/2016  . Sjogren's syndrome (HCC) 09/26/2015  . Uncontrolled type 2 diabetes mellitus with hyperglycemia, with long-term current use of insulin (HCC) 09/26/2015  . Hyperosmolar non-ketotic state in patient with type 2 diabetes mellitus (HCC) 09/25/2015  . Ileus (HCC) 09/22/2015  . Acute respiratory failure with hypoxia (HCC) 09/22/2015  . Acute gouty arthritis 09/22/2015  . CAD (coronary artery  disease) of artery bypass graft 09/22/2015  . Sepsis (HCC) 09/21/2015  . Elevated troponin level 09/21/2015  . ESRD on peritoneal dialysis (HCC) 09/21/2015  . CVA (cerebral infarction) 09/21/2015      Expected Discharge Date: Expected Discharge Date: 04/01/17   Team Members Present: Physician leading conference: Dr. Maryla MorrowAnkit Patel Social Worker Present: Dossie DerBecky Theoren Palka, LCSW Nurse Present: Allayne Stackhelsey Evans, RN PT Present: Woodfin GanjaEmily Van Shagen, PT OT Present: Perrin MalteseJames McGuire, OT SLP Present: Jackalyn LombardNicole Page, SLP PPS Coordinator present : Tora DuckMarie Noel, RN, CRRN       Current Status/Progress Goal Weekly Team Focus  Medical      Decreased functional mobility secondary to right BKA and revision of left BKA 03/01/2017.   Improve mobility, endurance, transfers, BP, CBGs  See above   Bowel/Bladder     Oliguric, PD daily, continent of bowel LBM 03/21/17. Loose BMs at times, on Miralax and colace  Remain continent of b/b with mod 1 assist  Assess and treat for constipation, assess for loose stools and adjust medication    Swallow/Nutrition/ Hydration               ADL's     Min assist for UB selfcare sitting unsupported EOB.  Mod assist for LB selfcare supine rolling. mod to max assist for sliding board transfers to the wheelchair and drop arm commode.  min A level for BSC transfers and toileting; set up bed level bathing and LB dressing  selfcare retraining, balance retraining, transfer training, therapeutic exercise, pt/family education   Mobility     mod assist slideboard transfers, mod assist bed mobility, supervision w/c, min-supervision sitting balance  Min A transfers, supervision bed mobility, supervision power w/c  endurance, activity tolerance, bed mobility, sitting balance   Communication               Safety/Cognition/ Behavioral Observations             Pain     Pain to RLE stump, tylenol and oxycodone 5mg  for pain, Pt dangles stump off bed for relief in addition to analgesics  Pain that is <=  3/10  Assess and treat for pain q shift and prn   Skin     Right BKA with staples some darker areas throughout surgical incision, dressings changed per order, left stump sutures   No further skin breakdown or infection with mod/max assist  Assess skin q shift and prn perform dressing changes per MD order, air overlay mattress     *See Care Plan and progress notes for long and short-term goals.      Barriers  to Discharge   Current Status/Progress Possible Resolutions Date Resolved   Physician     Decreased caregiver support;Medical stability;Home environment access/layout;Wound Care;Lack of/limited family support;Other (comments)  PD, Orthostasis  See above  Therapies, follow labs, optimize DM/BP meds      Nursing                 PT  Wound Care                 OT                 SLP            SW              Discharge Planning/Teaching Needs:  Home with wife who stays here with pt and will be providing care to him at discharge      Team Discussion:  Goals-min assist level. Currently mod-max. Wife participating in therapies with pt. Flucuates levels has a posterior lean. MD reports BP and BS issues. Stump shrinker ordered. Medical issues limit his participation in therapies.  Revisions to Treatment Plan:  DC 12/22    Continued Need for Acute Rehabilitation Level of Care: The patient requires daily medical management by a physician with specialized training in physical medicine and rehabilitation for the following conditions: Daily direction of a multidisciplinary physical rehabilitation program to ensure safe treatment while eliciting the highest outcome that is of practical value to the patient.: Yes Daily medical management of patient stability for increased activity during participation in an intensive rehabilitation regime.: Yes Daily analysis of laboratory values and/or radiology reports with any subsequent need for medication adjustment of medical intervention for : Post  surgical problems;Diabetes problems;Wound care problems;Renal problems;Other;Blood pressure problems   Lucy Chris 03/22/2017, 2:39 PM                  Betsabe Iglesia, Lemar Livings, LCSW  Social Worker  Physical Medicine and Rehabilitation  Patient Care Conference  Signed  Date of Service:  03/15/2017  2:38 PM          Signed          [] Hide copied text  [] Hover for details   Inpatient RehabilitationTeam Conference and Plan of Care Update Date: 03/15/2017   Time: 2:25 PM      Patient Name: [name]      Medical Record Number: [id number]  Date of Birth: 07-Dec-1940 Sex: Male         Room/Bed: 4M08C/4M08C-01 Payor Info: Payor: MEDICARE / Plan: MEDICARE PART A AND B / Product Type: *No Product type* /     Admitting Diagnosis: B BKA  Admit Date/Time:  03/14/2017  3:12 PM Admission Comments: No comment available    Primary Diagnosis:  <principal problem not specified> Principal Problem: <principal problem not specified>       Patient Active Problem List    Diagnosis Date Noted  . Type 2 diabetes mellitus with peripheral neuropathy (HCC)    . New onset atrial fibrillation (HCC)    . Confusion 03/12/2017  . Loose stools    . Essential hypertension    . Acute combined systolic and diastolic congestive heart failure (HCC)    . Poorly controlled type 2 diabetes mellitus with peripheral neuropathy (HCC)    . S/P bilateral below knee amputation (HCC) 03/06/2017  . Constipation due to pain medication    . Coronary artery disease involving native coronary artery of native heart without angina pectoris    .  Hyperlipidemia    . S/P unilateral BKA (below knee amputation), right (HCC)    . SIRS (systemic inflammatory response syndrome) (HCC) 03/05/2017  . S/P bilateral BKA (below knee amputation) (HCC) 03/01/2017  . Gangrene of right foot (HCC) 02/23/2017  . Blood in stool    . Wound dehiscence    . Rupture of operation wound    . Toe amputation status, right (HCC)     . Pressure injury of head, stage 3 (HCC)    . Poor nutrition    . Brittle diabetes (HCC)    . Adjustment disorder with depressed mood    . Hypoglycemia    . Ischemic pain of right foot    . Arterial hypotension    . Diabetes mellitus type 2 in obese (HCC)    . PVD (peripheral vascular disease) (HCC)    . Delirium    . History of left below knee amputation (HCC) 01/17/2017  . Acute blood loss anemia    . Elevated AST (SGOT) 01/09/2017  . Chest pain 01/09/2017  . NSTEMI (non-ST elevated myocardial infarction) (HCC) 01/09/2017  . Hypokalemia 01/07/2017  . Hypomagnesemia 01/07/2017  . Left foot infection 01/06/2017  . Chronic hypotension 01/06/2017  . 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 IDDM (insulin dependent diabetes mellitus) (HCC) 01/06/2017  . Foot infection 01/06/2017  . Labile blood pressure    . Hallucination    . Benign essential HTN    . Ulcer of toe of right foot (HCC)    . Ulcer of external ear, limited to breakdown of skin (HCC)    . Chronic congestive heart failure (HCC)    . Anemia of chronic disease    . Labile blood glucose    . Uncontrolled diabetes mellitus type 2 with peripheral artery disease (HCC)    . Diabetic peripheral neuropathy (HCC)    . Debility 12/27/2016  . History of transmetatarsal amputation of left foot (HCC) 12/27/2016  . Constipation    . Chronic obstructive pulmonary disease (HCC)    . Nausea    . ESRD on dialysis (HCC)    . Leukocytosis    . Tachypnea    . FUO (fever of unknown origin)    . Encephalopathy 12/23/2016  . Sore throat 12/23/2016  . Gangrene of left foot (HCC) 11/26/2016  . PAD (peripheral artery disease) (HCC) 11/26/2016  . Sjogren's syndrome (HCC) 09/26/2015  . Uncontrolled type 2 diabetes mellitus with hyperglycemia, with long-term current use of insulin (HCC) 09/26/2015  . Hyperosmolar non-ketotic state in patient with type 2 diabetes mellitus (HCC) 09/25/2015  . Ileus (HCC)  09/22/2015  . Acute respiratory failure with hypoxia (HCC) 09/22/2015  . Acute gouty arthritis 09/22/2015  . CAD (coronary artery disease) of artery bypass graft 09/22/2015  . Sepsis (HCC) 09/21/2015  . Elevated troponin level 09/21/2015  . ESRD on peritoneal dialysis (HCC) 09/21/2015  . CVA (cerebral infarction) 09/21/2015      Expected Discharge Date:     Team Members Present: Physician leading conference: Dr. Maryla Morrow Social Worker Present: Dossie Der, LCSW Nurse Present: Chrissie Noa, RN PT Present: Woodfin Ganja, PT OT Present: Perrin Maltese, OT SLP Present: Jackalyn Lombard, SLP PPS Coordinator present : Tora Duck, RN, CRRN       Current Status/Progress Goal Weekly Team Focus  Medical     Decreased functional mobility secondary to right BKA and revision of left BKA 03/01/2017.   Improve mobility, endurance, transfers, Afib  See above   Bowel/Bladder  Oliguric; PD daily; Continent of bowel LBM 12/6.  Having loose BMs at times; on miralax, colace  Remain continent of bowel and bladder with mod I assist  Assess and treat for constipation; assess for need to decrease bowel medication for loose stools.   Swallow/Nutrition/ Hydration     Poor appetite; on premier protein         ADL's               Mobility     Max A slideboard transfers, mod-max bed mobility, supervision w/c, min-mod a sitting balance  Min A transfers, supervision bed mobility, supervision power w/c  endurance, transfers, bed mobility, sitting balance   Communication               Safety/Cognition/ Behavioral Observations             Pain     Pain to right lower extremity stump; tylenol, oxycodone 5 mg in use.  Patient states relief with dangling stump off bed  < 4  Assess and treat for pain q shift and prn   Skin     Right BKA with staples- some darker areas (possibly necrotic); dressing changes performed per order; left stump with sutures and Duda stump shrinker; scab to bridge of nose  No  further skin breakdown or infection with mod/max assist.  Assess skin q shift and prn; perform dressing changes per MD order; on air overlay mattress     *See Care Plan and progress notes for long and short-term goals.      Barriers to Discharge   Current Status/Progress Possible Resolutions Date Resolved   Physician     Decreased caregiver support;Medical stability;Home environment access/layout;Wound Care;Lack of/limited family support;Other (comments)  PD  See above  Therapies, follow labs, optimize afib meds, optimize DM/BP meds      Nursing   Wound Care;Weight bearing restrictions;Medical stability             PT                    OT                 SLP            SW Medical stability              Discharge Planning/Teaching Needs:  Home with wife who stays here with him and provides emotional support. Plan to take him home with rehab completed and medically stable      Team Discussion:  Medical isues not able to participate in therapies due to BP issues and dizziness.  Renal MD consulted and orders .  Revisions to Treatment Plan:  Re-eval    Continued Need for Acute Rehabilitation Level of Care: The patient requires daily medical management by a physician with specialized training in physical medicine and rehabilitation for the following conditions: Daily direction of a multidisciplinary physical rehabilitation program to ensure safe treatment while eliciting the highest outcome that is of practical value to the patient.: Yes Daily medical management of patient stability for increased activity during participation in an intensive rehabilitation regime.: Yes Daily analysis of laboratory values and/or radiology reports with any subsequent need for medication adjustment of medical intervention for : Post surgical problems;Diabetes problems;Wound care problems;Renal problems;Other;Blood pressure problems   Lucy Chris 03/17/2017, 8:48 AM                Mariacristina Aday,  Lemar Livings, LCSW Social Worker Signed  Patient Care Conference Date of Service: 01/04/2017  8:47 AM      Hide copied text Hover for attribution information Inpatient RehabilitationTeam Conference and Plan of Care Update Date: 01/04/2017   Time: 8:20 AM      Patient Name: Joshua [name]      Medical Record Number: [id number]  Date of Birth: 04/08/1941 Sex: Male         Room/Bed: 4M08C/4M08C-01 Payor Info: Payor: MEDICARE / Plan: MEDICARE PART A AND B / Product Type: *No Product type* /     Admitting Diagnosis: L TMA  Admit Date/Time:  12/27/2016  4:38 PM Admission Comments: No comment available    Primary Diagnosis:  Debility Principal Problem: Debility       Patient Active Problem List    Diagnosis Date Noted  . Labile blood pressure    . Hallucination    . Benign essential HTN    . Ulcer of toe of right foot (HCC)    . Ulcer of external ear, limited to breakdown of skin (HCC)    . Chronic congestive heart failure (HCC)    . Anemia of chronic disease    . Labile blood glucose    . Uncontrolled diabetes mellitus type 2 with peripheral artery disease (HCC)    . Diabetic peripheral neuropathy (HCC)    . Debility 12/27/2016  . History of transmetatarsal amputation of left foot (HCC) 12/27/2016  . Constipation    . Chronic obstructive pulmonary disease (HCC)    . Nausea    . ESRD on dialysis (HCC)    . Leukocytosis    . Tachypnea    . FUO (fever of unknown origin)    . Encephalopathy 12/23/2016  . Sore throat 12/23/2016  . Gangrene of left foot (HCC) 11/26/2016  . PAD (peripheral artery disease) (HCC) 11/26/2016  . Sjogren's syndrome (HCC) 09/26/2015  . Uncontrolled type 2 diabetes mellitus with hyperglycemia, with long-term current use of insulin (HCC) 09/26/2015  . Hyperosmolar non-ketotic state in patient with type 2 diabetes mellitus (HCC) 09/25/2015  . Ileus (HCC) 09/22/2015  . Acute respiratory failure with hypoxia (HCC) 09/22/2015  . Acute gouty arthritis  09/22/2015  . CAD (coronary artery disease) of artery bypass graft 09/22/2015  . Sepsis (HCC) 09/21/2015  . Elevated troponin level 09/21/2015  . ESRD on peritoneal dialysis (HCC) 09/21/2015  . CVA (cerebral infarction) 09/21/2015      Expected Discharge Date: Expected Discharge Date: 01/13/17   Team Members Present: Physician leading conference: Dr. Maryla MorrowAnkit Patel Social Worker Present: Dossie DerBecky Bertina Guthridge, LCSW Nurse Present: Allayne Stackhelsey Evans, RN PT Present: Woodfin GanjaEmily Van Shagen, PT OT Present: Perrin MalteseJames McGuire, OT       Current Status/Progress Goal Weekly Team Focus  Medical     Functional and mobility deficits  secondary to PAD requiring left TMA, right groin wound, multiple medical  Improve mobility, transferes, safety, DM, wounds, infection  See above   Bowel/Bladder     Continent of bowel and bladder; LBM 9/25; PD nightly  Mod I assist  Assess and treat for constipation as needed   Swallow/Nutrition/ Hydration     Poor appetite         ADL's     mod assist for LB selfcare, supervision UB selfcare, mod assist transfers stand pivot with use of the RW.  supervision to min assist  standing balance, transfer training, selfcare retraining, strength endurance, strength training, pt/family education   Mobility     min to mod assist for slideboard transfers, max  assist for sit<>stand and gait  supervision-Mod I   adherence to precautions, slideboard transfers, w/c propulsion   Communication               Safety/Cognition/ Behavioral Observations             Pain     C/o pain in left foot intermittently, tylenol and tramadol used only; patient cannot tolerate strong meds  < 4  Assess and treat for pain q shift and prn   Skin     Incision to left foot with minimal serosanguinous drainage, area of purple skin on medial part of incision; stage II to nasal bridge; DTI to right ear; MASD to sacrum (MCG powder), right groin wound dressing changes BID; wound to right 5th toe with aquacel dressings BID   Mod assist  Assess skin q shift and prn; perform dressing changes per MD order     *See Care Plan and progress notes for long and short-term goals.      Barriers to Discharge   Current Status/Progress Possible Resolutions Date Resolved   Physician     Medical stability;Decreased caregiver support;Wound Care;Lack of/limited family support;Other (comments);IV antibiotics     See above  Therapies, optimize DM meds, follow labs, follow foot - ?further intervention, Ortho/Vasc recs, follow halucinations - meds d/ced, likely infection      Nursing   Medical stability;Wound Care;Nutrition means;Weight bearing restrictions  uncontrolled DM, Pain management; PD nightly           PT                    OT                 SLP            SW              Discharge Planning/Teaching Needs:    Home with wife who can provide assist-here daily and observing in therapies.     Team Discussion:  Medical issues-question infection. Dr Lajoyce Corners in yesterday to look at feet. X-ray of foot today. Adjusting pain meds and DM meds. Stage 2 B-ears, toe and groin. IV antibiotics currently, may need to go home with. Downgrade goals to supervision-min assist level  Revisions to Treatment Plan:  Downgrade goals to min-supervision level and DC 10/5    Continued Need for Acute Rehabilitation Level of Care: The patient requires daily medical management by a physician with specialized training in physical medicine and rehabilitation for the following conditions: Daily direction of a multidisciplinary physical rehabilitation program to ensure safe treatment while eliciting the highest outcome that is of practical value to the patient.: Yes Daily medical management of patient stability for increased activity during participation in an intensive rehabilitation regime.: Yes Daily analysis of laboratory values and/or radiology reports with any subsequent need for medication adjustment of medical intervention for : Blood pressure  problems;Wound care problems;Other;Post surgical problems;Diabetes problems;Mood/behavior problems   Lucy Chris 01/04/2017, 8:47 AM           Patient ID: Joshua Kitchen, male   DOB: 19-Apr-1940, 76 y.o.   MRN: [id number]

## 2017-03-22 NOTE — Patient Care Conference (Signed)
Inpatient RehabilitationTeam Conference and Plan of Care Update Date: 03/22/2017   Time: 2:15 PM    Patient Name: [name]      Medical Record Number: [id number]  Date of Birth: 03-26-1941 Sex: Male         Room/Bed: 4M08C/4M08C-01 Payor Info: Payor: MEDICARE / Plan: MEDICARE PART A AND B / Product Type: *No Product type* /    Admitting Diagnosis: B BKA  Admit Date/Time:  03/14/2017  3:12 PM Admission Comments: No comment available   Primary Diagnosis:  <principal problem not specified> Principal Problem: <principal problem not specified>  Patient Active Problem List   Diagnosis Date Noted  . Tachycardia   . Type 2 diabetes mellitus with peripheral neuropathy (HCC)   . New onset atrial fibrillation (HCC)   . Confusion 03/12/2017  . Loose stools   . Essential hypertension   . Acute combined systolic and diastolic congestive heart failure (HCC)   . Poorly controlled type 2 diabetes mellitus with peripheral neuropathy (HCC)   . S/P bilateral below knee amputation (HCC) 03/06/2017  . Constipation due to pain medication   . Coronary artery disease involving native coronary artery of native heart without angina pectoris   . Hyperlipidemia   . S/P unilateral BKA (below knee amputation), right (HCC)   . SIRS (systemic inflammatory response syndrome) (HCC) 03/05/2017  . S/P bilateral BKA (below knee amputation) (HCC) 03/01/2017  . Gangrene of right foot (HCC) 02/23/2017  . Blood in stool   . Wound dehiscence   . Rupture of operation wound   . Toe amputation status, right (HCC)   . Pressure injury of head, stage 3 (HCC)   . Poor nutrition   . Brittle diabetes (HCC)   . Adjustment disorder with depressed mood   . Hypoglycemia   . Ischemic pain of right foot   . Arterial hypotension   . Diabetes mellitus type 2 in obese (HCC)   . PVD (peripheral vascular disease) (HCC)   . Delirium   . History of left below knee amputation (HCC) 01/17/2017  . Acute blood loss anemia   .  Elevated AST (SGOT) 01/09/2017  . Chest pain 01/09/2017  . NSTEMI (non-ST elevated myocardial infarction) (HCC) 01/09/2017  . Hypokalemia 01/07/2017  . Hypomagnesemia 01/07/2017  . Left foot infection 01/06/2017  . Chronic hypotension 01/06/2017  . 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 IDDM (insulin dependent diabetes mellitus) (HCC) 01/06/2017  . Foot infection 01/06/2017  . Labile blood pressure   . Hallucination   . Benign essential HTN   . Ulcer of toe of right foot (HCC)   . Ulcer of external ear, limited to breakdown of skin (HCC)   . Chronic congestive heart failure (HCC)   . Anemia of chronic disease   . Labile blood glucose   . 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 Diabetic peripheral neuropathy (HCC)   . Debility 12/27/2016  . History of transmetatarsal amputation of left foot (HCC) 12/27/2016  . Constipation   . Chronic obstructive pulmonary disease (HCC)   . Nausea   . ESRD on dialysis (HCC)   . Leukocytosis   . Tachypnea   . FUO (fever of unknown origin)   . Encephalopathy 12/23/2016  . Sore throat 12/23/2016  . Gangrene of left foot (HCC) 11/26/2016  . PAD (peripheral artery disease) (HCC) 11/26/2016  . Sjogren's syndrome (HCC) 09/26/2015  . Uncontrolled type 2 diabetes mellitus with hyperglycemia, with long-term current use  of insulin (HCC) 09/26/2015  . Hyperosmolar non-ketotic state in patient with type 2 diabetes mellitus (HCC) 09/25/2015  . Ileus (HCC) 09/22/2015  . Acute respiratory failure with hypoxia (HCC) 09/22/2015  . Acute gouty arthritis 09/22/2015  . CAD (coronary artery disease) of artery bypass graft 09/22/2015  . Sepsis (HCC) 09/21/2015  . Elevated troponin level 09/21/2015  . ESRD on peritoneal dialysis (HCC) 09/21/2015  . CVA (cerebral infarction) 09/21/2015    Expected Discharge Date: Expected Discharge Date: 04/01/17  Team Members Present: Physician leading  conference: Dr. Maryla MorrowAnkit Patel Social Worker Present: Dossie DerBecky Dontez Hauss, LCSW Nurse Present: Allayne Stackhelsey Evans, RN PT Present: Woodfin GanjaEmily Van Shagen, PT OT Present: Perrin MalteseJames McGuire, OT SLP Present: Jackalyn LombardNicole Page, SLP PPS Coordinator present : Tora DuckMarie Noel, RN, CRRN     Current Status/Progress Goal Weekly Team Focus  Medical    Decreased functional mobility secondary to right BKA and revision of left BKA 03/01/2017.   Improve mobility, endurance, transfers, BP, CBGs  See above   Bowel/Bladder   Oliguric, PD daily, continent of bowel LBM 03/21/17. Loose BMs at times, on Miralax and colace  Remain continent of b/b with mod 1 assist  Assess and treat for constipation, assess for loose stools and adjust medication    Swallow/Nutrition/ Hydration             ADL's   Min assist for UB selfcare sitting unsupported EOB.  Mod assist for LB selfcare supine rolling. mod to max assist for sliding board transfers to the wheelchair and drop arm commode.  min A level for BSC transfers and toileting; set up bed level bathing and LB dressing  selfcare retraining, balance retraining, transfer training, therapeutic exercise, pt/family education   Mobility   mod assist slideboard transfers, mod assist bed mobility, supervision w/c, min-supervision sitting balance  Min A transfers, supervision bed mobility, supervision power w/c  endurance, activity tolerance, bed mobility, sitting balance   Communication             Safety/Cognition/ Behavioral Observations            Pain   Pain to RLE stump, tylenol and oxycodone 5mg  for pain, Pt dangles stump off bed for relief in addition to analgesics  Pain that is <= 3/10  Assess and treat for pain q shift and prn   Skin   Right BKA with staples some darker areas throughout surgical incision, dressings changed per order, left stump sutures   No further skin breakdown or infection with mod/max assist  Assess skin q shift and prn perform dressing changes per MD order, air overlay mattress       *See Care Plan and progress notes for long and short-term goals.     Barriers to Discharge  Current Status/Progress Possible Resolutions Date Resolved   Physician    Decreased caregiver support;Medical stability;Home environment access/layout;Wound Care;Lack of/limited family support;Other (comments)  PD, Orthostasis  See above  Therapies, follow labs, optimize DM/BP meds      Nursing                  PT  Wound Care                 OT                  SLP                SW                Discharge  Planning/Teaching Needs:  Home with wife who stays here with pt and will be providing care to him at discharge      Team Discussion:  Goals-min assist level. Currently mod-max. Wife participating in therapies with pt. Flucuates levels has a posterior lean. MD reports BP and BS issues. Stump shrinker ordered. Medical issues limit his participation in therapies.  Revisions to Treatment Plan:  DC 12/22    Continued Need for Acute Rehabilitation Level of Care: The patient requires daily medical management by a physician with specialized training in physical medicine and rehabilitation for the following conditions: Daily direction of a multidisciplinary physical rehabilitation program to ensure safe treatment while eliciting the highest outcome that is of practical value to the patient.: Yes Daily medical management of patient stability for increased activity during participation in an intensive rehabilitation regime.: Yes Daily analysis of laboratory values and/or radiology reports with any subsequent need for medication adjustment of medical intervention for : Post surgical problems;Diabetes problems;Wound care problems;Renal problems;Other;Blood pressure problems  Lucy ChrisDupree, Macky Galik G 03/22/2017, 2:39 PM

## 2017-03-22 NOTE — Progress Notes (Signed)
Physical Therapy Session Note  Patient Details  Name: Joshua KitchenJames D Schoppe MRN: [id number] Date of Birth: 07-08-1940  Today's Date: 03/22/2017 PT Individual Time: [id number] PT Individual Time Calculation (min): 57 min   Short Term Goals: Week 1:  PT Short Term Goal 1 (Week 1): Pt will perform bed mobility w/ Min A  PT Short Term Goal 2 (Week 1): Pt will transfer bed<>chair w/ Mod A  PT Short Term Goal 3 (Week 1): Pt will self-propel w/c 6175' w/ supervision PT Short Term Goal 4 (Week 1): Pt will maintain static sitting balance w/ supervision   Skilled Therapeutic Interventions/Progress Updates:    Pt reclined in power chair upon PT arrival, agreeable to therapy tx and reports pain 6/10 in R distal limb. Therapist checked BP while seated in w/c: 128/98. Therapist applied abdominal binder total assist to help control BP. Pt performed power mobility from room>gym with supervision, setting up transfers with verbal cues. Pt performed level slideboard transfer from w/c>mat with mod assist, lateral scoots and manual facilitation for weightshifts. After transferring to mat BP 96/68, pt asymptomatic. Session focused on core strengthening and anterior weightshifting. Pt performed sit ups from reclined position on wedge with weighted ball in hands 3 x 5 with rest breaks in between secondary to fatigue and decreased activity tolerance. Pt seated edge of mat worked on scooting forwards/backwards with emphasis on lateral weightshifts and using dicem under hands for better grip on mat. Pt transferred mat>w/c using slideboard and dicem with min assist and verbal cues for techniques. Pts BP monitored after transfer: 84/55, pt asymptomatic and RN made aware. Pt left reclined in w/c in room with wife present.     Therapy Documentation Precautions:  Precautions Precautions: Fall Restrictions Weight Bearing Restrictions: Yes RLE Weight Bearing: Non weight bearing RLE Partial Weight Bearing Percentage or Pounds:  BKA LLE Weight Bearing: Non weight bearing LLE Partial Weight Bearing Percentage or Pounds: BKA    See Function Navigator for Current Functional Status.   Therapy/Group: Individual Therapy  Cresenciano GenreEmily van Schagen, PT, DPT 03/22/2017, 3:59 PM

## 2017-03-22 NOTE — Progress Notes (Signed)
Nutrition Follow-up  DOCUMENTATION CODES:   Not applicable  INTERVENTION:  Continue Nepro Shake po BID, each supplement provides 425 kcal and 19 grams protein.  Continue 30 ml Prostat po BID, each supplement provides 100 kcal and 15 grams of protein.   Encourage adequate PO intake.   NUTRITION DIAGNOSIS:   Increased nutrient needs related to chronic illness(ESRD) as evidenced by estimated needs; ongoing  GOAL:   Patient will meet greater than or equal to 90% of their needs; met  MONITOR:   PO intake, Supplement acceptance, Labs, Diet advancement, Weight trends, I & O's, Skin  REASON FOR ASSESSMENT:   Malnutrition Screening Tool    ASSESSMENT:   76 y.o. 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 Pt underwent right BKA and revision of the left BKA. Patient stabilized felt the patient could return back to inpatient rehabilitation services and was readmitted for a comprehensive rehabilitation program  Meal completion has been 50-100%. 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 Pt and wife at bedside were educated on protein containing foods other than animal meat. Pt currently has Nepro shake and Prostat ordered and has been consuming them. RD to continue with current orders to aid in adequate protein needs. Wife has been encouraging pt PO intake at meals.    NUTRITION - FOCUSED PHYSICAL EXAM:    Most Recent Value  Orbital Region  No depletion  Upper Arm Region  No depletion  Thoracic and Lumbar Region  No depletion  Buccal Region  No depletion  Temple Region  No depletion  Clavicle Bone Region  No depletion  Clavicle and Acromion Bone Region  No depletion  Scapular Bone Region  No  depletion  Dorsal Hand  No depletion  Patellar Region  No depletion  Anterior Thigh Region  No depletion  Posterior Calf Region  No depletion  Edema (RD Assessment)  Unable to assess  Hair  Reviewed  Eyes  Reviewed  Mouth  Reviewed  Skin  Reviewed  Nails  Reviewed       Diet Order:  Diet Carb Modified Fluid consistency: Thin; Room service appropriate? Yes  EDUCATION NEEDS:   No education needs have been identified at this time  Skin:  Skin Assessment: Skin Integrity Issues: Skin Integrity Issues:: (Stage I to buttocks)  Last BM:  12/11  Height:   Ht Readings from Last 1 Encounters:  03/12/17 '5\' 9"'  (1.753 m)    Weight:   Wt Readings from Last 1 Encounters:  03/21/17 158 lb 11.7 oz (72 kg)    Ideal Body Weight:  63.3 kg(adjusted for bilat BKA)  BMI:  Body mass index is 23.44 kg/m.  Estimated Nutritional Needs:   Kcal:  1850-2100  Protein:  90-100 grams  Fluid:  Per MD    Corrin Parker, MS, RD, LDN Pager # [phone] After hours/ weekend pager # [phone]

## 2017-03-22 NOTE — Progress Notes (Signed)
Wife states pt had "an episode" early this morning, states pt was having difficulty breathing and HR was elevated.  Don't know if this happened during a dwell cycle of his PD or not.  Also, bed scale not accurate, I asked the floor RN to see if [facility].

## 2017-03-22 NOTE — Progress Notes (Signed)
Occupational Therapy Session Note  Patient Details  Name: Joshua [name] MRN: [id number] Date of Birth: Dec 29, 1940  Today's Date: 03/22/2017 OT Individual Time: 1430-1500 OT Individual Time Calculation (min): 30 min    Short Term Goals: Week 1:  OT Short Term Goal 1 (Week 1): Pt will transfer to drop arm BSC with mod assist with slide board. OT Short Term Goal 2 (Week 1): Pt will don pants in bed with min A. OT Short Term Goal 3 (Week 1): Pt will perform lateral leans on BSC with S to assist with clothing management. OT Short Term Goal 4 (Week 1): Pt will maintain sitting balance EOB during ADL completion with MinA OT Short Term Goal 5 (Week 1): Pt will perform bed mobility with MinA in preparation for EOB ADL completion.   Skilled Therapeutic Interventions/Progress Updates:    Pt completed transition from supine to sit EOB with min assist. BP taken at 118/57 in sitting.  He then completed transfer scooting using the sliding board with mod assist to power wheelchair.  Mod demonstrational cueing for technique to scoot as he attempts to grab hold of the end of the board and pull himself across instead of using his UEs to create lift by pushing down on the board.  Once in the wheelchair and scooted back he complained of more dizziness, requiring another BP check.  This time he was at 112/54.  Therapist positioned wheelchair in reclined position for comfort.  After a few mins, he reported feeling better and the wheelchair was positioned back up right.  Completed 2 sets of 10 repetitions for triceps extension in the power wheelchair with BUEs.  Finished session with pt sitting next to the bed with spouse present.  Discussed if he started feeling dizziness to recline the wheelchair back as we had done earlier.    Therapy Documentation Precautions:  Precautions Precautions: Fall Restrictions Weight Bearing Restrictions: Yes RLE Weight Bearing: Non weight bearing RLE Partial Weight Bearing  Percentage or Pounds: BKA LLE Weight Bearing: Non weight bearing LLE Partial Weight Bearing Percentage or Pounds: BKA   Vital Signs: Therapy Vitals Temp: 97.9 F (36.6 C) Temp Source: Oral Pulse Rate: 89 Resp: 14 BP: (!) 118/57 Patient Position (if appropriate): Sitting Oxygen Therapy SpO2: 100 % O2 Device: Not Delivered Pain: Pain Assessment Pain Assessment: Faces Faces Pain Scale: Hurts a little bit Pain Type: Acute pain Pain Location: Leg Pain Orientation: Right Pain Descriptors / Indicators: Discomfort Pain Intervention(s): Repositioned ADL: See Function Navigator for Current Functional Status.   Therapy/Group: Individual Therapy  Raksha Wolfgang,Sani OTR/L 03/22/2017, 3:51 PM

## 2017-03-22 NOTE — Progress Notes (Signed)
Occupational Therapy Session Note  Patient Details  Name: Joshua [name] MRN: [id number] Date of Birth: 1940/08/24  Today's Date: 03/22/2017 OT Individual Time: 1001-1055 OT Individual Time Calculation (min): 54 min    Short Term Goals: Week 1:  OT Short Term Goal 1 (Week 1): Pt will transfer to drop arm BSC with mod assist with slide board. OT Short Term Goal 2 (Week 1): Pt will don pants in bed with min A. OT Short Term Goal 3 (Week 1): Pt will perform lateral leans on BSC with S to assist with clothing management. OT Short Term Goal 4 (Week 1): Pt will maintain sitting balance EOB during ADL completion with MinA OT Short Term Goal 5 (Week 1): Pt will perform bed mobility with MinA in preparation for EOB ADL completion.   Skilled Therapeutic Interventions/Progress Updates:    Pt completed bathing and dressing supine to sit during session.  He was able to initially transition to sitting from supine with min guard assist and use of the rail.  Once sitting he completed all UB bathing and dressing without LOB, but still with posterior bias.  He then transitioned to supine rolling for work on LB bathing of peri area as well as LB dressing.  He needed use of the bed rail to assist with partial rolling side to side with min assist to thoroughly wash his bottom and pull his underpants and pants over his hips.  He needed mod assist to transition back to sitting EOB.  Pt with report of dizziness in sitting so quickly transitioned to supine with supervision and BP taken at 87/70 with HR shown at 115.  Attempted to sit back up again and BP taken at 75/42.  Pt unable to obtain second blood pressure in sitting secondary to feeling bad and needing to return to supine.  Nursing notified and came into the room once pt was laying down.  He became nauseas as well and vomited X2.  Pt left with nursing and spouse in room as well.  Did not attempt OOB secondary to BP issues.    Therapy Documentation Precautions:   Precautions Precautions: Fall Restrictions Weight Bearing Restrictions: Yes RLE Weight Bearing: Non weight bearing RLE Partial Weight Bearing Percentage or Pounds: BKA LLE Weight Bearing: Non weight bearing LLE Partial Weight Bearing Percentage or Pounds: BKA   Vital Signs: Therapy Vitals Temp: 98 F (36.7 C) Temp Source: Oral Pulse Rate: (!) 115 Resp: 18 BP: (!) 75/42 Patient Position (if appropriate): Sitting Oxygen Therapy SpO2: 99 % O2 Device: Not Delivered Pain: Pain Assessment Pain Assessment: Faces Pain Score: 2  Faces Pain Scale: Hurts a little bit Pain Location: Leg Pain Orientation: Right Pain Descriptors / Indicators: Discomfort Pain Intervention(s): Repositioned ADL: See Function Navigator for Current Functional Status.   Therapy/Group: Individual Therapy  Merlin Golden,Tommey OTR/L 03/22/2017, 12:23 PM

## 2017-03-22 NOTE — Progress Notes (Signed)
Orthopedic Tech Progress Note Patient Details:  Joshua [name] [birth year] [id number]  Ortho Devices Type of Ortho Device: Abdominal binder Ortho Device/Splint Location: abdomen Ortho Device/Splint Interventions: Freeman CaldronOrdered       [name] 03/22/2017, 1:00 PM

## 2017-03-23 ENCOUNTER — Inpatient Hospital Stay (HOSPITAL_COMMUNITY): Payer: Medicare Other | Admitting: Physical Therapy

## 2017-03-23 ENCOUNTER — Inpatient Hospital Stay (HOSPITAL_COMMUNITY): Payer: Medicare Other | Admitting: Occupational Therapy

## 2017-03-23 DIAGNOSIS — R0989 Other specified symptoms and signs involving the circulatory and respiratory systems: Secondary | ICD-10-CM

## 2017-03-23 LAB — CBC WITH DIFFERENTIAL/PLATELET
BASOS PCT: 0 %
Basophils Absolute: 0 10*3/uL (ref 0.0–0.1)
EOS ABS: 0.2 10*3/uL (ref 0.0–0.7)
Eosinophils Relative: 3 %
HCT: 32.5 % — ABNORMAL LOW (ref 39.0–52.0)
HEMOGLOBIN: 10.3 g/dL — AB (ref 13.0–17.0)
Lymphocytes Relative: 17 %
Lymphs Abs: 1.6 10*3/uL (ref 0.7–4.0)
MCH: 29.9 pg (ref 26.0–34.0)
MCHC: 31.7 g/dL (ref 30.0–36.0)
MCV: 94.5 fL (ref 78.0–100.0)
Monocytes Absolute: 1.1 10*3/uL — ABNORMAL HIGH (ref 0.1–1.0)
Monocytes Relative: 12 %
NEUTROS PCT: 68 %
Neutro Abs: 6.3 10*3/uL (ref 1.7–7.7)
PLATELETS: 497 10*3/uL — AB (ref 150–400)
RBC: 3.44 MIL/uL — AB (ref 4.22–5.81)
RDW: 16.6 % — ABNORMAL HIGH (ref 11.5–15.5)
WBC: 9.2 10*3/uL (ref 4.0–10.5)

## 2017-03-23 LAB — GLUCOSE, CAPILLARY
GLUCOSE-CAPILLARY: 273 mg/dL — AB (ref 65–99)
GLUCOSE-CAPILLARY: 224 mg/dL — AB (ref 65–99)
GLUCOSE-CAPILLARY: 364 mg/dL — AB (ref 65–99)
Glucose-Capillary: 158 mg/dL — ABNORMAL HIGH (ref 65–99)

## 2017-03-23 MED ORDER — MIDODRINE HCL 5 MG PO TABS: 2.5000 mg | ORAL_TABLET | Freq: Two times a day (BID) | ORAL | Status: DC

## 2017-03-23 MED ORDER — HEPARIN 1000 UNIT/ML FOR PERITONEAL DIALYSIS: 500.0000 [IU] | INTRAMUSCULAR | Status: DC | PRN

## 2017-03-23 MED ORDER — ONDANSETRON HCL 4 MG PO TABS
4.0000 mg | ORAL_TABLET | Freq: Three times a day (TID) | ORAL | Status: DC | PRN
  Administered 2017-03-23 – 2017-03-25 (×3): 4 mg via ORAL
  Filled 2017-03-23 (×3): qty 1

## 2017-03-23 MED ORDER — GENTAMICIN SULFATE 0.1 % EX CREA
1.0000 "application " | TOPICAL_CREAM | Freq: Every day | CUTANEOUS | Status: DC
  Administered 2017-03-23 – 2017-03-24 (×2): 1 via TOPICAL
  Filled 2017-03-23: qty 15

## 2017-03-23 MED ORDER — HEPARIN 1000 UNIT/ML FOR PERITONEAL DIALYSIS
INTRAPERITONEAL | Status: DC | PRN
  Filled 2017-03-23: qty 5000

## 2017-03-23 MED ORDER — DELFLEX-LC/1.5% DEXTROSE 344 MOSM/L IP SOLN
INTRAPERITONEAL | Status: DC
  Administered 2017-03-23 – 2017-03-24 (×2): via INTRAPERITONEAL

## 2017-03-23 MED ORDER — DELFLEX-LC/2.5% DEXTROSE 394 MOSM/L IP SOLN
INTRAPERITONEAL | Status: DC
  Administered 2017-03-23 – 2017-03-24 (×2): via INTRAPERITONEAL

## 2017-03-23 MED ORDER — MIDODRINE HCL 5 MG PO TABS
2.5000 mg | ORAL_TABLET | Freq: Two times a day (BID) | ORAL | Status: DC
  Administered 2017-03-23 – 2017-03-28 (×12): 2.5 mg via ORAL
  Filled 2017-03-23 (×11): qty 1

## 2017-03-23 NOTE — Progress Notes (Signed)
PD tx initiated via tenckhoff w/o problem, VSS, report given to Vella RaringElena Greccu, RN

## 2017-03-23 NOTE — Progress Notes (Signed)
Occupational Therapy Session Note  Patient Details  Name: Joshua [name] MRN: [id number] Date of Birth: [birth year]  Today's Date: 03/23/2017 OT Individual Time: 1300-1400 OT Individual Time Calculation (min): 60 min    Short Term Goals: Week 2:  OT Short Term Goal 1 (Week 2): Continue working on established LTGs set at overall min assist level.   Skilled Therapeutic Interventions/Progress Updates:    Pt presented sitting up in w/c, agreeable to OT tx session. Pt reports some dizziness, BP taken and 116/55. Pt agreeable to attending CIR Pt holiday party. Pt propels motor w/c to dayroom with min-mod verbal cues for avoiding obstacles to and throughout dayroom during party. Pt engaged in seated wii activity, Pt's choice of baseball with focus on UE strengthening/endurance, completing task approx 15 min. Pt engaged in seated holiday themed game providing cognitive challenge of word finding with Min verbal cues for thinking of appropriate holiday words. Pt's BP taken prior to exiting party, reading 114/61. Pt returned to room in manner described above, Pt left reclined in TIS w/c, spouse present, call bell and needs within reach.   Therapy Documentation Precautions:  Precautions Precautions: Fall Restrictions Weight Bearing Restrictions: Yes RLE Weight Bearing: Non weight bearing RLE Partial Weight Bearing Percentage or Pounds: BKA LLE Weight Bearing: Non weight bearing LLE Partial Weight Bearing Percentage or Pounds: BKA   Pain: Pain Assessment Pain Assessment: Faces Faces Pain Scale: Hurts a little bit Pain Type: Surgical pain Pain Location: Leg Pain Orientation: Right Pain Descriptors / Indicators: Discomfort Pain Onset: On-going Pain Intervention(s): Repositioned  See Function Navigator for Current Functional Status.   Therapy/Group: Individual Therapy  Orlando PennerBreanna L Katti Pelle 03/23/2017, 4:49 PM

## 2017-03-23 NOTE — Progress Notes (Signed)
PD treatment completed overnight with no issues.  UF 1475 mL  PD catheter clamped, capped, and secured on patient.  Will continue to monitor.

## 2017-03-23 NOTE — Progress Notes (Signed)
Occupational Therapy Weekly Progress Note  Patient Details  Name: [name] MRN: [id number] Date of Birth: 20-Feb-1941  Beginning of progress report period: March 16, 2017 End of progress report period: March 23, 2017  Today's Date: 03/23/2017 OT Individual Time: 9628-3662 OT Individual Time Calculation (min): 55 min    Patient has met 3 of 5 short term goals.  [name] continues to demonstrate variable progress with OT at this time.  Sitting balance during UB selfcare can be min to supervision with increased posterior bias still present.  He needed min assist for LB bathing and dressing supine rolling slightly in the bed.  Mod assist for rolling completely side to side with use of the rail secondary to decreased strength and some back pain per his report.  Min to mod assist for supine to sit as well with mod demonstrational cueing to sequence movement.  He is still limited by hypotension as well which causes him dizziness at times when sitting upright, requiring the need to transition to supine.  Pt continues to needed mod instructional cueing at times for dressing secondary to orientation issues such as placing his right arm in the neck hole or putting his LE in the wrong leg hole.  Recommend continued comprehensive OT to address current deficits in preparation for discharge home 12/22.    Patient continues to demonstrate the following deficits: muscle weakness, decreased problem solving and decreased memory and decreased sitting balance and decreased balance strategies and therefore will continue to benefit from skilled OT intervention to enhance overall performance with BADL and Reduce care partner burden.  Patient progressing toward long term goals..  Continue plan of care.  OT Short Term Goals Week 2:   Continue working on established LTGs set at min assist overall.   Skilled Therapeutic Interventions/Progress Updates:    Pt completed bathing and dressing sitting on the EOB  to supine.  He was able to wash and dress all UB with supervision sitting unsupported.  BP in sitting at 103/47.  He transitioned to supine with min assist for completion of LB bathing and dressing.  Pt still with significant difficulty rolling with and without use of rails.  He tends to try and lift his upper legs in the air to remove and donn LB clothing which is not as efficient.  Mod assist with mod demonstrational cueing for rolling side to side to pull garments over hips.  Finished session with transfer to the power wheelchair with use of the sliding board and max assist.  Pt unable to efficiently lift his body for transfer and instead relies on trying to pull himself across using the edge of the sliding board or arm of the wheelchair.  Reclined wheelchair back secondary to pt reporting slight dizziness when sitting all the way up.  Pt's spouse in room and call button in pt's lap at end of session.   Therapy Documentation Precautions:  Precautions Precautions: Fall Restrictions Weight Bearing Restrictions: Yes RLE Weight Bearing: Non weight bearing RLE Partial Weight Bearing Percentage or Pounds: BKA LLE Weight Bearing: Non weight bearing LLE Partial Weight Bearing Percentage or Pounds: BKA   Vital Signs: Therapy Vitals Pulse Rate: (!) 101 BP: (!) 103/47 Patient Position (if appropriate): Sitting Oxygen Therapy SpO2: 99 % O2 Device: Not Delivered Pulse Oximetry Type: Intermittent Pain: Pain Assessment Pain Assessment: Faces Faces Pain Scale: Hurts a little bit Pain Type: Surgical pain Pain Location: Leg Pain Orientation: Right Pain Descriptors / Indicators: Discomfort Pain Onset: On-going ADL:  See Function Navigator for Current Functional Status.   Therapy/Group: Individual Therapy  Jacinto Keil,[place] OTR/L 03/23/2017, 12:26 PM

## 2017-03-23 NOTE — Progress Notes (Addendum)
Social Work Patient ID: [name], male   DOB: 1940/04/15, 76 y.o.   MRN: [id number] Met with pt and wife to inform team conference goals min-some mod assist level and target discharge 12/22. Both feel and hope he will be ready medically since this seems to be his limitation in therapies. Will continue to provide support and assist with discharge needs. Pt does not want a hospital bed at home so PT to work with both on transfers in and out of height beds they have at home. Wife reports they don't have the room for one at home. Will see next week how it is going.

## 2017-03-23 NOTE — Progress Notes (Signed)
[facility] PHYSICAL MEDICINE & REHABILITATION     PROGRESS NOTE  Subjective/Complaints:  Patient seen lying in bed this morning. Wife at bedside. Patient states he tried to sit up to eat breakfast and became dizzy and needed to lie down.  ROS: Denies CP, SOB, nausea, vomiting, diarrhea.  Objective: Vital Signs: Blood pressure (!) 143/106, pulse (!) 56, temperature 98.5 F (36.9 C), temperature source Axillary, resp. rate 18, weight 72 kg (158 lb 11.7 oz), SpO2 97 %. No results found. Recent Labs    03/23/17 0648  WBC 9.2  HGB 10.3*  HCT 32.5*  PLT 497*   No results for input(s): NA, K, CL, GLUCOSE, BUN, CREATININE, CALCIUM in the last 72 hours.  Invalid input(s): CO CBG (last 3)  Recent Labs    03/22/17 1633 03/22/17 2140 03/23/17 0627  GLUCAP 140* 247* 273*    Wt Readings from Last 3 Encounters:  03/21/17 72 kg (158 lb 11.7 oz)  03/14/17 75 kg (165 lb 5.5 oz)  03/12/17 78 kg (171 lb 15.3 oz)    Physical Exam:  BP (!) 143/106 (BP Location: Right Arm)   Pulse (!) 56   Temp 98.5 F (36.9 C) (Axillary)   Resp 18   Wt 72 kg (158 lb 11.7 oz)   SpO2 97%   BMI 23.44 kg/m  Constitutional: He appears well-developed and well-nourished.  HENT:  Head: Normocephalic.  Eyes: EOM are normal. No discharge.  Cardiovascular: RRR.[name]. No JVD. Respiratory: Effort normal and breath sounds normal.   GI: Bowel sounds are normal. He exhibits no distension.   Musculoskeletal: He exhibits edemaand tenderness.  Neurological: He is alert and oriented.  Motor: B/l UE 4+/5 proximal to distal RLE: HF 4/5 (pain inhibition, stable) LLE: HF 4/5 (pain inhibition, stable) Skin:  See above Left BKA with dressing c/d/i Right BKA with dressing c/d/i  Assessment/Plan: 1. 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 Physiatrist is providing close team supervision and 24 hour management  of active medical problems listed below. Physiatrist and rehab team continue to assess barriers to discharge/monitor patient progress toward functional and medical goals.  Function:  Bathing Bathing position   Position: Sitting EOB  Bathing parts Body parts bathed by patient: Right arm, Left arm, Chest, Abdomen, Front perineal area, Buttocks, Right upper leg, Left upper leg, Right lower leg Body parts bathed by helper: Back  Bathing assist Assist Level: Supervision or verbal cues      Upper Body Dressing/Undressing Upper body dressing   What is the patient wearing?: Pull over shirt/dress     Pull over shirt/dress - Perfomed by patient: Thread/unthread right sleeve, Thread/unthread left sleeve, Put head through opening, Pull shirt over trunk Pull over shirt/dress - Perfomed by helper: Put head through opening, Pull shirt over trunk        Upper body assist Assist Level: Supervision or verbal cues      Lower Body Dressing/Undressing Lower body dressing   What is the patient wearing?: Pants, Underwear Underwear - Performed by patient: Thread/unthread right underwear leg, Thread/unthread left underwear leg, Pull underwear up/down Underwear - Performed by helper: Pull underwear up/down Pants- Performed by patient: Thread/unthread right pants leg, Thread/unthread left pants leg, Pull pants up/down Pants- Performed by helper: Pull pants up/down                      Lower body assist Assist for lower body  dressing: 2 Helpers      Financial trader activity did not occur: No continent bowel/bladder event   Toileting steps completed by helper: Adjust clothing prior to toileting, Performs perineal hygiene, Adjust clothing after toileting Toileting Assistive Devices: Toilet aid  Toileting assist Assist level: Two helpers   Transfers Chair/bed transfer   Chair/bed transfer method: Lateral scoot Chair/bed transfer assist level: Touching or steadying assistance  (Pt > 75%) Chair/bed transfer assistive device: Sliding board     Locomotion Ambulation Ambulation activity did not occur: N/A         Wheelchair   Type: Motorized Max wheelchair distance: 150' Assist Level: Supervision or verbal cues  Cognition Comprehension Comprehension assist level: Understands basic 90% of the time/cues < 10% of the time  Expression Expression assist level: Expresses basic 90% of the time/requires cueing < 10% of the time.  Social Interaction Social Interaction assist level: Interacts appropriately 75 - 89% of the time - Needs redirection for appropriate language or to initiate interaction.  Problem Solving Problem solving assist level: Solves basic 75 - 89% of the time/requires cueing 10 - 24% of the time  Memory Memory assist level: Recognizes or recalls 75 - 89% of the time/requires cueing 10 - 24% of the time    Medical Problem List and Plan: 1. 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 Bilateral wound vacs discontinued 03/05/2017 per Dr. Lajoyce Corners   Continue CIR   Right BKA evaluated by Ortho, appreciate recs, continue current management.  Previously seen for mobility assessment 2. DVT Prophylaxis/Anticoagulation: Heparin per nephrology 3. Pain Management: Flexeril and Oxycodone as needed 4. Mood: Provide emotional support 5. Neuropsych: This patient is capable of making decisions on his own behalf. 6. Skin/Wound Care: Routine skin checks 7. Fluids/Electrolytes/Nutrition: Routine I&O's 8. Acute on chronic anemia. Continue Aranesp   Hemoglobin 10.3 on 12/13   Cont to monitor 9. End-stage renal disease/CPPD. Follow-up renal services 10. Diabetes mellitus peripheral neuropathy. Latest hemoglobin A1c 8.8.   Patient brittle diabetic with significant changes with mild adjustments to insulin   Check blood sugars before meals and at bedtime.    NPH insulin 65 units daily at breakfast, decreased to 55 on 12/7,  decreased to 45 on 12/11    NPH increased to 48U on 12/9   Sliding scale changed to sensitive   Remains labile, however overall improved  CBG (last 3)  Recent Labs    03/22/17 1633 03/22/17 2140 03/23/17 0627  GLUCAP 140* 247* 273*   11. CAD with CABG. Continue Plavix and aspirin. No chest pain or shortness of breath 12. Diastolic congestive heart failure. Monitor for any signs of fluid overload Filed Weights   03/21/17 0531 03/21/17 0900 03/21/17 1546  Weight: 48 kg (105 lb 13.1 oz) 72 kg (158 lb 11.7 oz) 72 kg (158 lb 11.7 oz)  13. SIRS. Resolved.   Antibiotics completed. Blood cultures no growth. Monitor for any fever  14. Hypertension. Coreg 12.5 mg twice daily, decreased to 6.25 on 12/11    Vitals:   03/22/17 1445 03/23/17 0617  BP: (!) 118/57 (!) 143/106  Pulse:  (!) 56  Resp:  18  Temp:  98.5 F (36.9 C)  SpO2:  97%     Symptomatic orthostasis, abdominal binder   Encourage fluids    Will order low dose Midodrin on 12/13 15. New onset atrial fibrillation. Follow-up cardiology services. Coreg 12.5 mg twice daily 16. Hyperlipidemia. Lipitor 17. Constipation. Laxative assistance  LOS (Days) 9 A FACE TO FACE EVALUATION WAS PERFORMED  Vasiliki Smaldone Karis Jubanil Ellanore Vanhook 03/23/2017 9:03 AM

## 2017-03-23 NOTE — Progress Notes (Signed)
[facility] Progress Note   Subjective:   Lightheaded this am getting up    Vitals:   03/23/17 0617 03/23/17 0946 03/23/17 0947 03/23/17 1223  BP: (!) 143/106 (!) 114/50 (!) 116/51 (!) 103/47  Pulse: (!) 56   (!) 101  Resp: 18     Temp: 98.5 F (36.9 C)     TempSrc: Axillary     SpO2: 97%   99%  Weight:        Inpatient medications: . aspirin EC  81 mg Oral Daily  . atorvastatin  80 mg Oral q1800  . calcium acetate  667 mg Oral TID WC  . carvedilol  6.25 mg Oral BID WC  . chlorhexidine  15 mL Mouth/Throat BID  . clopidogrel  75 mg Oral Daily  . darbepoetin (ARANESP) injection - NON-DIALYSIS  200 mcg Subcutaneous Q Sat-1800  . docusate sodium  100 mg Oral BID  . feeding supplement (NEPRO CARB STEADY)  237 mL Oral BID BM  . feeding supplement (PRO-STAT SUGAR FREE 64)  30 mL Oral BID  . gentamicin cream  1 application Topical Daily  . insulin aspart  0-8 Units Subcutaneous TID WC  . insulin NPH Human  45 Units Subcutaneous QAC breakfast  . insulin NPH Human  48 Units Subcutaneous QHS  . midodrine  2.5 mg Oral BID WC  . multivitamin  1 tablet Oral Daily  . [START ON 03/30/2017] Vitamin D (Ergocalciferol)  50,000 Units Oral Q30 days   . dialysis solution 1.5% low-MG/low-CA    . dialysis solution 2.5% low-MG/low-CA     acetaminophen **OR** acetaminophen, heparin, nitroGLYCERIN, oxyCODONE, polyethylene glycol, sorbitol  Exam: General:  Alert ,NAD, OX3 appropriate  Heart: RRR; No m, r, g Lungs: CTA bilat anteriorly, diminished at the bases. Abdomen: soft, non-tender., non distended, PD cath in place. Extremities: L BKA and new R BKA. No stump edema, no hip edema Dialysis Access: PD cath, L AVF + thrill, bruit not developed well   [facility] 6 exchanges/day one mid-day exchange (occurs around 9am).  2.5 liters of 2.5% per [facility] 2 liters of 2.5% for last fill and mid day drain. Dr. Denny PeonBaveja.   -baseline weight for him pre  amputation was around 190lbs   Assessment/ Plan:    1. PAD sp revision L BKA and new R BKA on 11/21 2. Vol - may be getting dry, bp's lower and dizzy on standing.  3. DM2 - per primary 4. Afib with RVR - per primary. 5. [place] [facility] [place].  6. Hypokalemia - better, sp supplementation. 7. HTN -  BP controlled coreg 6.25mg  bid 8. MBD of CKD - holding phoslo for lowish P. No Vit D. Follow trend  9. UTI - blood cx's neg, no urine cx in system. Sp short course abx. 10. Malnutrition - alb 1.3   Prostat, +Nepro.  11. Anemia of CKD - Darbe increased to 200 mcg q week    Plan - decreased UF with PD to 2/3 1.5% and 1/3 2.5% fluids.    Vinson Moselleob Traeger Sultana MD [facility] pager [phone]   03/23/2017, 2:33 PM   Recent Labs  Lab 03/19/17 0603  NA 129*  K 4.0  CL 92*  CO2 27  GLUCOSE 294*  BUN 63*  CREATININE 5.00*  CALCIUM 8.3*  PHOS 3.6   Recent Labs  Lab 03/19/17 0603  ALBUMIN 1.5*   Recent Labs  Lab 03/19/17 0603 03/23/17 0648  WBC 8.9 9.2  NEUTROABS  --  6.3  HGB 8.8* 10.3*  HCT 28.4* 32.5*  MCV 94.7 94.5  PLT 468* 497*   Iron/TIBC/Ferritin/ %Sat    Component Value Date/Time   IRON 28 (L) 02/02/2017 0705   TIBC 158 (L) 02/02/2017 0705   FERRITIN 2,284 (H) 12/31/2016 0602   IRONPCTSAT 18 02/02/2017 [id number]

## 2017-03-23 NOTE — Progress Notes (Signed)
Physical Therapy Weekly Progress Note  Patient Details  Name: [name] MRN: [id number] Date of Birth: 12-29-1940  Beginning of progress report period: March 15, 2017 End of progress report period: March 23, 2017  Today's Date: 03/23/2017 PT Individual Time: 915-945 AND 1515-1555 PT Individual Time Calculation (min): 30 min AND 40 min    Patient has met 3 of 4 short term goals. Pt is consistently transferring bed<>chair w/ mod assist on level surfaces and requiring min to mod assist for bed mobility. However, he continues to heavily utilize bedrails and hospital bed functions to perform bed mobility and power functions of w/c to transfer at a mod assist level. He does have a loaner motorized w/c w/ seat lifting functions at home that will assist w/ level transfers, will initiate discussion of possible hospital bed at home, w/ treatment team, to assist w/ bed mobility and transfers to minimize caregiver burden. Participation in therapies is most limited by medical issues at this time, most notably symptomatic low BP. Treatment focus will remain on increasing endurance and tolerance to OOB activity, increasing UE strength for decreased assistance required w/ lateral scoot transfers, and safe power mobility in home-like environments.   Patient continues to demonstrate the following deficits muscle weakness and muscle joint tightness, decreased cardiorespiratoy endurance and decreased postural control, decreased balance strategies and difficulty maintaining precautions and therefore will continue to benefit from skilled PT intervention to increase functional independence with mobility.  Patient progressing toward long term goals..  Continue plan of care.  PT Short Term Goals Week 1:  PT Short Term Goal 1 (Week 1): Pt will perform bed mobility w/ Min A  PT Short Term Goal 1 - Progress (Week 1): Partly met PT Short Term Goal 2 (Week 1): Pt will transfer bed<>chair w/ Mod A  PT Short Term  Goal 2 - Progress (Week 1): Met PT Short Term Goal 3 (Week 1): Pt will self-propel w/c 65' w/ supervision PT Short Term Goal 3 - Progress (Week 1): Met PT Short Term Goal 4 (Week 1): Pt will maintain static sitting balance w/ supervision  PT Short Term Goal 4 - Progress (Week 1): Met Week 2:  PT Short Term Goal 1 (Week 2): =LTGs due to ELOS  Skilled Therapeutic Interventions/Progress Updates:   Session 1:  Pt supine upon arrival, still connected to dialysis catheter however no longer actively running. Pt agreeable to therapy, no c/o pain. Focused on tolerance to upright w/o back support and static sitting balance this session 2/2 remaining connected to dialysis. Wife and pt report episode of dizziness and low BP earlier in morning when sitting up. Wife donned abdominal binder w/ total assist and Pt transferred to EOB w/ mod assist to boost from sidelying. All bed mobility required increased time for pt to complete as he wanted to attempt to perform mobility by himself. Pt required bilateral UE support to maintain static sitting w/ close supervision, c/o dizziness after 5 minutes of sitting up. BP WNL at this time. C/o dizziness did not go away, pt transferred back to supine w/ supervision and BP remained WNL. Pt reports dizziness improving in supine, but remained present. Ended session in supine and in care of wife, all needs met. RN made aware of status.   Session 2:  Pt in w/c upon arrival and agreeable to therapy, reports feeling nauseous this afternoon and he had already received medication for it. Worked on UE strengthening in w/c first part of session reviewing HEP including tricep  pull downs, UE lifts, and scooting in chair. Verbal and visual cues for technique, mostly for unweighting bottom w/ UE lifts from chair. Continued education on pressure relief and prevention of skin breakdown. Pt w/ 1 bout of dizziness during exercises that resolved within a few seconds. Worked on w/c mobility rest of  session, focused on maneuvering home-like environments and moving at a faster, more functional but safe, speed in w/c. Verbal and visual cues for obstacle negotiation and environmental awareness. Returned in room and ended session in w/c tilted back for comfort and pressure relief, call bell within reach and all needs met.   Therapy Documentation Precautions:  Precautions Precautions: Fall Restrictions Weight Bearing Restrictions: Yes RLE Weight Bearing: Non weight bearing RLE Partial Weight Bearing Percentage or Pounds: BKA LLE Weight Bearing: Non weight bearing LLE Partial Weight Bearing Percentage or Pounds: BKA  Vital Signs: Therapy Vitals Temp: 98.6 F (37 C) Temp Source: Oral Pulse Rate: 98 Resp: 18 BP: 119/70 Patient Position (if appropriate): Sitting Oxygen Therapy SpO2: 100 % O2 Device: Not Delivered Pain: Pain Assessment Pain Assessment: Faces Faces Pain Scale: Hurts a little bit Pain Type: Surgical pain Pain Location: Leg Pain Orientation: Right Pain Descriptors / Indicators: Discomfort Pain Onset: On-going Pain Intervention(s): Repositioned  See Function Navigator for Current Functional Status.  Therapy/Group: Individual Therapy  Teah Votaw K Arnette 03/23/2017, 5:26 PM

## 2017-03-24 ENCOUNTER — Inpatient Hospital Stay (HOSPITAL_COMMUNITY): Payer: Medicare Other | Admitting: Occupational Therapy

## 2017-03-24 ENCOUNTER — Inpatient Hospital Stay (HOSPITAL_COMMUNITY): Payer: Medicare Other | Admitting: Physical Therapy

## 2017-03-24 LAB — BASIC METABOLIC PANEL
ANION GAP: 12 (ref 5–15)
BUN: 54 mg/dL — ABNORMAL HIGH (ref 6–20)
CALCIUM: 8.7 mg/dL — AB (ref 8.9–10.3)
CHLORIDE: 90 mmol/L — AB (ref 101–111)
CO2: 29 mmol/L (ref 22–32)
CREATININE: 4.77 mg/dL — AB (ref 0.61–1.24)
GFR calc non Af Amer: 11 mL/min — ABNORMAL LOW (ref >60)
GFR, EST AFRICAN AMERICAN: 12 mL/min — AB (ref >60)
Glucose, Bld: 303 mg/dL — ABNORMAL HIGH (ref 65–99)
Potassium: 3.3 mmol/L — ABNORMAL LOW (ref 3.5–5.1)
SODIUM: 131 mmol/L — AB (ref 135–145)

## 2017-03-24 LAB — CBC
HCT: 32.9 % — ABNORMAL LOW (ref 39.0–52.0)
HEMOGLOBIN: 10.2 g/dL — AB (ref 13.0–17.0)
MCH: 29.5 pg (ref 26.0–34.0)
MCHC: 31 g/dL (ref 30.0–36.0)
MCV: 95.1 fL (ref 78.0–100.0)
PLATELETS: 486 10*3/uL — AB (ref 150–400)
RBC: 3.46 MIL/uL — AB (ref 4.22–5.81)
RDW: 16.5 % — ABNORMAL HIGH (ref 11.5–15.5)
WBC: 8.2 10*3/uL (ref 4.0–10.5)

## 2017-03-24 LAB — GLUCOSE, CAPILLARY
GLUCOSE-CAPILLARY: 290 mg/dL — AB (ref 65–99)
GLUCOSE-CAPILLARY: 189 mg/dL — AB (ref 65–99)
GLUCOSE-CAPILLARY: 176 mg/dL — AB (ref 65–99)
Glucose-Capillary: 172 mg/dL — ABNORMAL HIGH (ref 65–99)

## 2017-03-24 MED ORDER — SODIUM CHLORIDE 0.9 % IV BOLUS (SEPSIS)
1000.0000 mL | Freq: Once | INTRAVENOUS | Status: AC
  Administered 2017-03-24: 1000 mL via INTRAVENOUS

## 2017-03-24 MED ORDER — INSULIN NPH (HUMAN) (ISOPHANE) 100 UNIT/ML ~~LOC~~ SUSP
51.0000 [IU] | Freq: Every day | SUBCUTANEOUS | Status: DC
  Administered 2017-03-24: 51 [IU] via SUBCUTANEOUS
  Filled 2017-03-24: qty 10

## 2017-03-24 MED ORDER — POTASSIUM CHLORIDE CRYS ER 20 MEQ PO TBCR
30.0000 meq | EXTENDED_RELEASE_TABLET | Freq: Every day | ORAL | Status: DC
  Administered 2017-03-24 – 2017-03-28 (×5): 30 meq via ORAL
  Filled 2017-03-24 (×4): qty 1

## 2017-03-24 MED ORDER — POTASSIUM CHLORIDE CRYS ER 20 MEQ PO TBCR: 30.0000 meq | EXTENDED_RELEASE_TABLET | Freq: Every day | ORAL | Status: DC

## 2017-03-24 NOTE — Progress Notes (Signed)
PD tx initiated via tenckhoff w/o problem, VSS, report given to Felix PaciniElenna Greccu, RN

## 2017-03-24 NOTE — Progress Notes (Signed)
[facility] Progress Note   Subjective:   Continues to have episodes of dizziness, which he associates most often with straining/exertion.  Associated symptoms include tachycardia, pallor, hypotension and felling cold.  Not all symptoms occur with each episode.   Objective Vitals:   03/23/17 1700 03/23/17 1827 03/24/17 0518 03/24/17 1411  BP:  (!) 114/55 (!) 95/47 (!) 111/53  Pulse:  94 84 98  Resp:  20 16 16   Temp:  98.1 F (36.7 C) 97.7 F (36.5 C) 97.7 F (36.5 C)  TempSrc:  Oral Axillary Oral  SpO2:  97% 100% 100%  Weight: 66.7 kg (147 lb) 71 kg (156 lb 8.4 oz)     Physical Exam General:NAD, WDWN elderly male Heart:RRR, no rub or gallop Lungs:CTAB, no wheeze, rales or rhonchi Abdomen:soft, NT, ND Extremities:no stump edema, L BKA and new R BKA Dialysis Access: PD cath, L AVF +thrill   Filed Weights   03/21/17 1546 03/23/17 1700 03/23/17 1827  Weight: 72 kg (158 lb 11.7 oz) 66.7 kg (147 lb) 71 kg (156 lb 8.4 oz)    Intake/Output Summary (Last 24 hours) at 03/24/2017 1504 Last data filed at 03/24/2017 1045 Gross per 24 hour  Intake 150240 ml  Output [id number] ml  Net 134370 ml    Additional Objective Labs: Basic Metabolic Panel: Recent Labs  Lab 03/19/17 0603 03/24/17 0525  NA 129* 131*  K 4.0 3.3*  CL 92* 90*  CO2 27 29  GLUCOSE 294* 303*  BUN 63* 54*  CREATININE 5.00* 4.77*  CALCIUM 8.3* 8.7*  PHOS 3.6  --    Liver Function Tests: Recent Labs  Lab 03/19/17 0603  ALBUMIN 1.5*   CBC: Recent Labs  Lab 03/19/17 0603 03/23/17 0648 03/24/17 0525  WBC 8.9 9.2 8.2  NEUTROABS  --  6.3  --   HGB 8.8* 10.3* 10.2*  HCT 28.4* 32.5* 32.9*  MCV 94.7 94.5 95.1  PLT 468* 497* 486*  CBG: Recent Labs  Lab 03/23/17 1212 03/23/17 1706 03/23/17 2205 03/24/17 0705 03/24/17 1213  GLUCAP 224* 158* 364* 290* 189*   Medications: . dialysis solution 1.5% low-MG/low-CA    . dialysis solution 2.5% low-MG/low-CA     . aspirin EC  81 mg Oral  Daily  . atorvastatin  80 mg Oral q1800  . calcium acetate  667 mg Oral TID WC  . chlorhexidine  15 mL Mouth/Throat BID  . clopidogrel  75 mg Oral Daily  . darbepoetin (ARANESP) injection - NON-DIALYSIS  200 mcg Subcutaneous Q Sat-1800  . docusate sodium  100 mg Oral BID  . feeding supplement (NEPRO CARB STEADY)  237 mL Oral BID BM  . feeding supplement (PRO-STAT SUGAR FREE 64)  30 mL Oral BID  . gentamicin cream  1 application Topical Daily  . insulin aspart  0-8 Units Subcutaneous TID WC  . insulin NPH Human  45 Units Subcutaneous QAC breakfast  . insulin NPH Human  51 Units Subcutaneous QHS  . midodrine  2.5 mg Oral BID WC  . multivitamin  1 tablet Oral Daily  . [START ON 03/30/2017] Vitamin D (Ergocalciferol)  50,000 Units Oral Q30 days    Dialysis Orders:  Center: [place] [facility]. 6 exchanges/day one mid-day exchange (occurs around 9am).  2.5 liters of 2.5% per [facility] 2 liters of 2.5% for last fill and mid day drain. Dr. Denny PeonBaveja.  -baseline weight for him pre amputation was around 190lbs ~86kg  Assessment/Plan: 1. PAD s/p revision L BKA & new R  BKA on 11/21 -  2. [place] [facility] [place], orders written. 3. Anemia of CKD- Hgb 10.2, follow trend. Aranesp 200mcg q wk  4. Secondary hyperparathyroidism -P 3.6 Cont to hold phoslo, Follow. No Vit D.  5. HTN/volume - still may be getting dry, no stump edema, lungs clear, continued low BP and hypotension. Adjusting PD orders.  6. Nutrition - severe malnutrition, Alb 1.5, Prostat +Nepro 7. A fib w/ RVR -per primary 8. DMT2 - per primary 9. Hypokalemia - K 3.3, continue supplementation   Virgina NorfolkLindsay Penninger, PA-C [facility] Pager: [phone] 03/24/2017,3:04 PM  LOS: 10 days   Pt seen, examined, agree w assess/plan as above with additions as indicated.  BP's soft w/ symptoms, poss vol depletion, will give NS bolus and reduce UF on PD to minimum.  Vinson Moselleob Larina Lieurance MD [facility] pager [phone]    cell [phone] 03/25/2017, 7:58 AM

## 2017-03-24 NOTE — Progress Notes (Signed)
Occupational Therapy Session Note  Patient Details  Name: Joshua KitchenJames D Doi MRN: [id number] Date of Birth: Sep 14, 1940  Today's Date: 03/24/2017 OT Individual Time: 0905-1002 OT Individual Time Calculation (min): 57 min    Short Term Goals: Week 2:  OT Short Term Goal 1 (Week 2): Continue working on established LTGs set at overall min assist level.   Skilled Therapeutic Interventions/Progress Updates:    Pt presented supine in bed agreeable to OT tx session. Focus of session on ADL/self-care retraining. Pt completes bed mobility with ModA to sit EOB, BP taken upon initial sitting EOB reading 107/44. Pt completes UB bathing with setup assist/supervision for sitting balance. Pt reporting increased dizziness sitting EOB, BP retaken and reading 78/43; returned to supine with BP increasing to 101/58, RN made aware. Pt agreeable to continue OT tx session from bed level, completing LB bathing and dressing with MinA for rolling side to side and minA/verbal cues for technique when donning LB clothing. Pt requesting wishes to attempt sitting EOB to transfer to w/c end of session. Performs bed mobility with MinGuard for safety, no physical assist required. Pt dons overhead shirt with setup, completes slideboard transfer to TIS w/c with MaxA. Pt BP taken while semi-reclined in TIS w/c reading 128/58 with RN made aware. Pt left semi-reclined in TIS w/c, call bell and needs within reach, spouse present.   Therapy Documentation Precautions:  Precautions Precautions: Fall Restrictions Weight Bearing Restrictions: Yes RLE Weight Bearing: Non weight bearing RLE Partial Weight Bearing Percentage or Pounds: BKA LLE Weight Bearing: Non weight bearing LLE Partial Weight Bearing Percentage or Pounds: BKA    Pain: Pain Assessment Pain Assessment: Faces Pain Score: 8  Faces Pain Scale: Hurts a little bit Pain Type: Surgical pain Pain Location: Leg Pain Orientation: Right Pain Descriptors / Indicators:  Aching Pain Frequency: Intermittent Pain Onset: On-going Pain Intervention(s): Rest;Repositioned  See Function Navigator for Current Functional Status.   Therapy/Group: Individual Therapy  Orlando PennerBreanna L Kaisha Wachob 03/24/2017, 1:17 PM

## 2017-03-24 NOTE — Progress Notes (Signed)
Physical Therapy Session Note  Patient Details  Name: [name] MRN: [id number] Date of Birth: [birth year]  Today's Date: 03/24/2017 PT Individual Time: 1030-1100, 1300-1335 AND 1620-1700 PT Individual Time Calculation (min): 30 min, 35 min, AND 40 min  Short Term Goals: Week 2:  PT Short Term Goal 1 (Week 2): =LTGs due to ELOS  Skilled Therapeutic Interventions/Progress Updates:   Session 1:  Pt in w/c upon arrival and agreeable to therapy, no c/o pain. Pt self-propelled w/c to/from ADL apartment w/ supervision using power mobility. Encouraging pt to adjust speed of w/c to environment as he tends to move w/c at very slow, non-functional pace. Practiced transfers to/from bed in ADL apartment to simulate home environment. Pt and wife report pt's loaner motorized w/c does not have seat rise function. Compensated for height difference of w/c and bed by tilting w/c back until front end of w/c seat was even w/ bed. Per wife and pt, this is how he transferred to bed before this most recent hospital admission. Pt performed transfer w/ mod assist and required manual facilitation for weight shifting. Pt is safe to transfer w/ this technique using w/c tilt function as it levels transfer to decrease caregiver burden. Returned to room and ended session in w/c and in care of wife, all needs met.   Session 2:  Pt in w/c upon arrival and agreeable to therapy, c/o 8/10 R residual limb pain - RN made aware and present to provide pain medication. Pt self-propelled w/c to/from ADL apartment w/ supervision using power mobility. Set-up w/c to work on transfers to/from recliner as pt reports spending large portion of day in his recliner at home. Pt however not tolerating sitting upright 2/2 pain and needed to tilt back in w/c for pain relief. Unable to resolve pain to perform transfer despite multiple attempts at repositioning in w/c for pain relief. Returned to room and ended session tilted in w/c, in care of  wife and all needs met. Missed 25 minutes of skilled PT 2/2 pain.   Session 3: Pt in w/c and agreeable to therapy, no c/o pain. Some c/o dizziness throughout session, however resolves w/ 10-15 sec of rest and focus on breathing. Continued focus on transfers this session. Pt self-propelled w/c to/from gym w/ supervision and performed lateral scoot transfer to/from mat w/ min-mod assist. Transfer took increased time 2/2 frequent rests for dizziness and focus on head/hips relationship for ease of transfer. No signs of symptoms of low BP or orthostasis during transfer. Verbal and visual cues for weight shifting using head/hips relationship, manual facilitation of weight shifting as well. Wife present during this session and able to provide similar cues w/o prompting from therapist. Returned to room and ended session in w/c and in care of wife, all needs met. Pt denied c/o dizziness at end of session.  Therapy Documentation Precautions:  Precautions Precautions: Fall Restrictions Weight Bearing Restrictions: Yes RLE Weight Bearing: Non weight bearing RLE Partial Weight Bearing Percentage or Pounds: BKA LLE Weight Bearing: Non weight bearing LLE Partial Weight Bearing Percentage or Pounds: BKA  Vital Signs: Therapy Vitals Temp: 97.7 F (36.5 C) Temp Source: Oral Pulse Rate: 89 Resp: 20 BP: (!) 135/53 Patient Position (if appropriate): Lying Oxygen Therapy SpO2: 98 % O2 Device: Not Delivered Pulse Oximetry Type: Intermittent Pain: Pain Assessment Pain Assessment: 0-10 Pain Score: 7  Pain Type: Acute pain Pain Location: Leg Pain Orientation: Right Pain Descriptors / Indicators: Aching Pain Frequency: Constant Pain Onset: On-going Pain Intervention(s): Medication (  See eMAR)  See Function Navigator for Current Functional Status.   Therapy/Group: Individual Therapy  [name] 03/24/2017, 7:17 PM

## 2017-03-24 NOTE — Progress Notes (Signed)
PD treatment completed overnight with no issues.  UF 761 mL  Catheter capped, clamped, and secured on patient.  [facility]  Will continue to monitor.

## 2017-03-24 NOTE — Progress Notes (Signed)
PHYSICAL MEDICINE & REHABILITATION     PROGRESS NOTE  Subjective/Complaints:  Patient seen lying in bed this morning. He states he became dizzy again when trying to sit up. Discussed with nursing. Changes being made to pediatrics by nephro.  ROS: Denies CP, SOB, nausea, vomiting, diarrhea.  Objective: Vital Signs: Blood pressure (!) 95/47, pulse 84, temperature 97.7 F (36.5 C), temperature source Axillary, resp. rate 16, weight 71 kg (156 lb 8.4 oz), SpO2 100 %. No results found. Recent Labs    03/23/17 0648 03/24/17 0525  WBC 9.2 8.2  HGB 10.3* 10.2*  HCT 32.5* 32.9*  PLT 497* 486*   Recent Labs    03/24/17 0525  NA 131*  K 3.3*  CL 90*  GLUCOSE 303*  BUN 54*  CREATININE 4.77*  CALCIUM 8.7*   CBG (last 3)  Recent Labs    03/23/17 1706 03/23/17 2205 03/24/17 0705  GLUCAP 158* 364* 290*    Wt Readings from Last 3 Encounters:  03/23/17 71 kg (156 lb 8.4 oz)  03/14/17 75 kg (165 lb 5.5 oz)  03/12/17 78 kg (171 lb 15.3 oz)    Physical Exam:  BP (!) 95/47 (BP Location: Right Arm)   Pulse 84   Temp 97.7 F (36.5 C) (Axillary)   Resp 16   Wt 71 kg (156 lb 8.4 oz)   SpO2 100%   BMI 23.11 kg/m  Constitutional: He appears well-developed and well-nourished.  HENT:  Head: Normocephalic.  Eyes: EOM are normal. No discharge.  Cardiovascular: RRR.[name]. No JVD. Respiratory: Effort normal and breath sounds normal.   GI: Bowel sounds are normal. He exhibits no distension.   Musculoskeletal: He exhibits edemaand tenderness.  Neurological: He is alert and oriented.  Motor: B/l UE 4+/5 proximal to distal RLE: HF 4/5 (pain inhibition, unchanged) LLE: HF 4/5 (pain inhibition, unchanged) Skin:  See above Left BKA with dressing c/d/i Right BKA with dressing c/d/i  Assessment/Plan: 1. 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 Physiatrist is  providing close team supervision and 24 hour management of active medical problems listed below. Physiatrist and rehab team continue to assess barriers to discharge/monitor patient progress toward functional and medical goals.  Function:  Bathing Bathing position   Position: Sitting EOB  Bathing parts Body parts bathed by patient: Right arm, Left arm, Chest, Abdomen, Front perineal area, Buttocks, Right upper leg, Left upper leg, Left lower leg Body parts bathed by helper: Back  Bathing assist Assist Level: Supervision or verbal cues      Upper Body Dressing/Undressing Upper body dressing   What is the patient wearing?: Pull over shirt/dress     Pull over shirt/dress - Perfomed by patient: Thread/unthread right sleeve, Thread/unthread left sleeve, Put head through opening, Pull shirt over trunk Pull over shirt/dress - Perfomed by helper: Put head through opening, Pull shirt over trunk        Upper body assist Assist Level: Supervision or verbal cues      Lower Body Dressing/Undressing Lower body dressing   What is the patient wearing?: Pants, Underwear Underwear - Performed by patient: Thread/unthread right underwear leg, Thread/unthread left underwear leg, Pull underwear up/down Underwear - Performed by helper: Pull underwear up/down Pants- Performed by patient: Thread/unthread right pants leg, Thread/unthread left pants leg, Pull pants up/down Pants- Performed by helper: Pull pants up/down  Lower body assist Assist for lower body dressing: Touching or steadying assistance (Pt > 75%)      Toileting Toileting Toileting activity did not occur: Safety/medical concerns   Toileting steps completed by helper: Adjust clothing prior to toileting, Performs perineal hygiene, Adjust clothing after toileting Toileting Assistive Devices: Toilet aid  Toileting assist Assist level: Two helpers   Transfers Chair/bed transfer   Chair/bed transfer method:  Lateral scoot Chair/bed transfer assist level: Maximal assist (Pt 25 - 49%/lift and lower) Chair/bed transfer assistive device: Sliding board     Locomotion Ambulation Ambulation activity did not occur: N/A         Wheelchair   Type: Motorized Max wheelchair distance: 250' Assist Level: Supervision or verbal cues  Cognition Comprehension Comprehension assist level: Understands basic 90% of the time/cues < 10% of the time  Expression Expression assist level: Expresses basic 90% of the time/requires cueing < 10% of the time.  Social Interaction Social Interaction assist level: Interacts appropriately 75 - 89% of the time - Needs redirection for appropriate language or to initiate interaction.  Problem Solving Problem solving assist level: Solves basic 75 - 89% of the time/requires cueing 10 - 24% of the time  Memory Memory assist level: Recognizes or recalls 75 - 89% of the time/requires cueing 10 - 24% of the time    Medical Problem List and Plan: 1. 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 Bilateral wound vacs discontinued 03/05/2017 per Dr. Lajoyce Corners   Continue CIR   Right BKA evaluated by Ortho, appreciate recs, continue current management.  Previously seen for mobility assessment 2. DVT Prophylaxis/Anticoagulation: Heparin per nephrology 3. Pain Management: Flexeril and Oxycodone as needed 4. Mood: Provide emotional support 5. Neuropsych: This patient is capable of making decisions on his own behalf. 6. Skin/Wound Care: Routine skin checks 7. Fluids/Electrolytes/Nutrition: Routine I&O's 8. Acute on chronic anemia. Continue Aranesp   Hemoglobin 10.2 on 12/14   Cont to monitor 9. End-stage renal disease/CPPD. Follow-up renal services   PD being adjusted by nephro, appreciate recs 10. Diabetes mellitus peripheral neuropathy. Latest hemoglobin A1c 8.8.   Patient brittle diabetic with significant changes with mild adjustments to  insulin   Check blood sugars before meals and at bedtime.    NPH insulin 65 units daily at breakfast, decreased to 55 on 12/7, decreased to 45 on 12/11    NPH at bedtime increased to 51 units on 12/14   Sliding scale changed to sensitive   Remains labile, especially with increased appetite CBG (last 3)  Recent Labs    03/23/17 1706 03/23/17 2205 03/24/17 0705  GLUCAP 158* 364* 290*   11. CAD with CABG. Continue Plavix and aspirin. No chest pain or shortness of breath 12. Diastolic congestive heart failure. Monitor for any signs of fluid overload Filed Weights   03/21/17 1546 03/23/17 1700 03/23/17 1827  Weight: 72 kg (158 lb 11.7 oz) 66.7 kg (147 lb) 71 kg (156 lb 8.4 oz)  13. SIRS. Resolved.   Antibiotics completed. Blood cultures no growth. Monitor for any fever  14. Hypertension. Coreg 12.5 mg twice daily, decreased to 6.25 on 12/11, DC'd on 12/14    Vitals:   03/23/17 1827 03/24/17 0518  BP: (!) 114/55 (!) 95/47  Pulse: 94 84  Resp: 20 16  Temp: 98.1 F (36.7 C) 97.7 F (36.5 C)  SpO2: 97% 100%     Symptomatic orthostasis, abdominal binder   Encourage fluids    Ordered low  dose Midodrin on 12/13 15. New onset atrial fibrillation. Follow-up cardiology services. Coreg 6.25  DC'd on 12/14 16. Hyperlipidemia. Lipitor 17. Constipation. Laxative assistance  LOS (Days) 10 A FACE TO FACE EVALUATION WAS PERFORMED  Ankit Karis Jubanil Patel 03/24/2017 9:05 AM

## 2017-03-25 ENCOUNTER — Inpatient Hospital Stay (HOSPITAL_COMMUNITY): Payer: Medicare Other | Admitting: Occupational Therapy

## 2017-03-25 LAB — GLUCOSE, CAPILLARY
GLUCOSE-CAPILLARY: 255 mg/dL — AB (ref 65–99)
GLUCOSE-CAPILLARY: 134 mg/dL — AB (ref 65–99)
GLUCOSE-CAPILLARY: 96 mg/dL (ref 65–99)
Glucose-Capillary: 229 mg/dL — ABNORMAL HIGH (ref 65–99)

## 2017-03-25 LAB — RENAL FUNCTION PANEL
ANION GAP: 9 (ref 5–15)
Albumin: 1.6 g/dL — ABNORMAL LOW (ref 3.5–5.0)
BUN: 53 mg/dL — ABNORMAL HIGH (ref 6–20)
CHLORIDE: 91 mmol/L — AB (ref 101–111)
CO2: 28 mmol/L (ref 22–32)
CREATININE: 4.5 mg/dL — AB (ref 0.61–1.24)
Calcium: 8 mg/dL — ABNORMAL LOW (ref 8.9–10.3)
GFR, EST AFRICAN AMERICAN: 13 mL/min — AB (ref >60)
GFR, EST NON AFRICAN AMERICAN: 12 mL/min — AB (ref >60)
Glucose, Bld: 289 mg/dL — ABNORMAL HIGH (ref 65–99)
POTASSIUM: 4.1 mmol/L (ref 3.5–5.1)
Phosphorus: 3.5 mg/dL (ref 2.5–4.6)
Sodium: 128 mmol/L — ABNORMAL LOW (ref 135–145)

## 2017-03-25 MED ORDER — INSULIN NPH (HUMAN) (ISOPHANE) 100 UNIT/ML ~~LOC~~ SUSP
52.0000 [IU] | Freq: Every day | SUBCUTANEOUS | Status: DC
  Administered 2017-03-26 – 2017-03-27 (×2): 52 [IU] via SUBCUTANEOUS

## 2017-03-25 MED ORDER — DELFLEX-LC/1.5% DEXTROSE 344 MOSM/L IP SOLN: INTRAPERITONEAL | Status: DC

## 2017-03-25 MED ORDER — GENTAMICIN SULFATE 0.1 % EX CREA
1.0000 "application " | TOPICAL_CREAM | Freq: Every day | CUTANEOUS | Status: DC
  Filled 2017-03-25: qty 15

## 2017-03-25 MED ORDER — SODIUM CHLORIDE 0.9 % IV BOLUS (SEPSIS)
1000.0000 mL | Freq: Once | INTRAVENOUS | Status: AC
  Administered 2017-03-25: 1000 mL via INTRAVENOUS

## 2017-03-25 MED ORDER — HEPARIN 1000 UNIT/ML FOR PERITONEAL DIALYSIS: 500.0000 [IU] | INTRAMUSCULAR | Status: DC | PRN

## 2017-03-25 MED ORDER — INSULIN NPH (HUMAN) (ISOPHANE) 100 UNIT/ML ~~LOC~~ SUSP
46.0000 [IU] | Freq: Every day | SUBCUTANEOUS | Status: DC
  Administered 2017-03-27 – 2017-03-28 (×2): 46 [IU] via SUBCUTANEOUS

## 2017-03-25 NOTE — Plan of Care (Signed)
Not Progressing RH BOWEL ELIMINATION RH STG MANAGE BOWEL WITH ASSISTANCE Description STG Manage Bowel with Assistance. min  03/25/2017 [id number] - Not Progressing by Fabian SharpWhisner, Tyran Huser, RN RH STG MANAGE BOWEL W/MEDICATION W/ASSISTANCE Description STG Manage Bowel with Medication with Assistance. Min  03/25/2017 [id number] - Not Progressing by Fabian SharpWhisner, Moataz Tavis, RN  Pt last BM 12/11. Very small BM 03/25/17. PRN laxative Miralax administered

## 2017-03-25 NOTE — Progress Notes (Signed)
[place] PHYSICAL MEDICINE & REHABILITATION     PROGRESS NOTE  Subjective/Complaints:  Peritoneal dialysis overnight without issues.  States that right leg remains somewhat sore but is tolerable.  Left leg doing fine  ROS: pt denies nausea, vomiting, diarrhea, cough, shortness of breath or chest pain   Objective: Vital Signs: Blood pressure (!) 135/53, pulse 89, temperature 97.7 F (36.5 C), temperature source Oral, resp. rate 20, weight 73 kg (160 lb 15 oz), SpO2 98 %. No results found. Recent Labs    03/23/17 0648 03/24/17 0525  WBC 9.2 8.2  HGB 10.3* 10.2*  HCT 32.5* 32.9*  PLT 497* 486*   Recent Labs    03/24/17 0525  NA 131*  K 3.3*  CL 90*  GLUCOSE 303*  BUN 54*  CREATININE 4.77*  CALCIUM 8.7*   CBG (last 3)  Recent Labs    03/24/17 1611 03/24/17 2131 03/25/17 0702  GLUCAP 176* 172* 229*    Wt Readings from Last 3 Encounters:  03/24/17 73 kg (160 lb 15 oz)  03/14/17 75 kg (165 lb 5.5 oz)  03/12/17 78 kg (171 lb 15.3 oz)    Physical Exam:  BP (!) 135/53 (BP Location: Right Arm)   Pulse 89   Temp 97.7 F (36.5 C) (Oral)   Resp 20   Wt 73 kg (160 lb 15 oz) Comment: ? accuracy of this bed scale  SpO2 98%   BMI 23.77 kg/m  Constitutional: He appears well-developed and well-nourished.  HENT:  Head: Normocephalic.  Eyes: EOM are normal. No discharge.  Cardiovascular: RRR without murmur. No JVD . Respiratory: CTA Bilaterally without wheezes or rales. Normal effort .   GI: Bowel sounds are normal. He exhibits no distension.   Musculoskeletal: He exhibits edemaand tenderness.  Neurological: He is alert and oriented.  Motor: B/l UE 4+/5 proximal to distal RLE: HF 4/5 (stable) LLE: HF 4/5 (stable) Skin:  See above Left BKA with dressing c/d/i Right BKA with dressing c/d/i  Assessment/Plan: 1. 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 Physiatrist is providing close team supervision and 24 hour management of active medical problems listed below. Physiatrist and rehab team continue to assess barriers to discharge/monitor patient progress toward functional and medical goals.  Function:  Bathing Bathing position   Position: Sitting EOB  Bathing parts Body parts bathed by patient: Right arm, Left arm, Chest, Abdomen, Front perineal area, Buttocks, Right upper leg, Left upper leg, Left lower leg Body parts bathed by helper: Back  Bathing assist Assist Level: Touching or steadying assistance(Pt > 75%)      Upper Body Dressing/Undressing Upper body dressing   What is the patient wearing?: Pull over shirt/dress     Pull over shirt/dress - Perfomed by patient: Thread/unthread right sleeve, Thread/unthread left sleeve, Put head through opening, Pull shirt over trunk Pull over shirt/dress - Perfomed by helper: Put head through opening, Pull shirt over trunk        Upper body assist Assist Level: Supervision or verbal cues      Lower Body Dressing/Undressing Lower body dressing   What is the patient wearing?: Pants, Underwear Underwear - Performed by patient: Thread/unthread right underwear leg, Thread/unthread left underwear leg, Pull underwear up/down Underwear - Performed by helper: Pull underwear up/down Pants- Performed by patient: Thread/unthread right pants leg, Thread/unthread left pants leg, Pull pants up/down Pants- Performed by helper: Pull pants up/down  Lower body assist Assist for lower body dressing: Touching or steadying assistance (Pt > 75%)      Toileting Toileting Toileting activity did not occur: Safety/medical concerns   Toileting steps completed by helper: Adjust clothing prior to toileting, Performs perineal hygiene, Adjust clothing after toileting Toileting Assistive Devices: Toilet aid  Toileting assist Assist level: Two helpers   Transfers Chair/bed  transfer   Chair/bed transfer method: Lateral scoot Chair/bed transfer assist level: Maximal assist (Pt 25 - 49%/lift and lower) Chair/bed transfer assistive device: Sliding board     Locomotion Ambulation Ambulation activity did not occur: N/A         Wheelchair   Type: Motorized Max wheelchair distance: 150' Assist Level: Supervision or verbal cues  Cognition Comprehension Comprehension assist level: Understands basic 90% of the time/cues < 10% of the time  Expression Expression assist level: Expresses basic 90% of the time/requires cueing < 10% of the time.  Social Interaction Social Interaction assist level: Interacts appropriately 75 - 89% of the time - Needs redirection for appropriate language or to initiate interaction.  Problem Solving Problem solving assist level: Solves basic 75 - 89% of the time/requires cueing 10 - 24% of the time  Memory Memory assist level: Recognizes or recalls 75 - 89% of the time/requires cueing 10 - 24% of the time    Medical Problem List and Plan: 1. 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 Bilateral wound vacs discontinued 03/05/2017 per [name]   Continue CIR   Right BKA evaluated by Ortho, appreciate recs, continue current management.   2. DVT Prophylaxis/Anticoagulation: Heparin per nephrology 3. Pain Management: Flexeril and Oxycodone as needed 4. Mood: Provide emotional support 5. Neuropsych: This patient is capable of making decisions on his own behalf. 6. Skin/Wound Care: Routine skin checks 7. Fluids/Electrolytes/Nutrition: Routine I&O's 8. Acute on chronic anemia. Continue Aranesp   Hemoglobin 10.2 on 12/14   Cont to monitor 9. End-stage renal disease/CPPD. Follow-up renal services   PD being adjusted by nephro, appreciate recs 10. Diabetes mellitus peripheral neuropathy. Latest hemoglobin A1c 8.8.   Patient brittle diabetic with significant changes with mild adjustments to  insulin   Check blood sugars before meals and at bedtime.    NPH insulin 65 units daily at breakfast, decreased to 55 on 12/7, decreased to 45 on 12/11--- on 12/15 increase morning Lantus to 46 units in p.m. 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 Continue Plavix and aspirin. No chest pain or shortness of breath 12. Diastolic congestive heart failure. Monitor for any signs of fluid overload Filed Weights   03/23/17 1700 03/23/17 1827 03/24/17 1814  Weight: 66.7 kg (147 lb) 71 kg (156 lb 8.4 oz) 73 kg (160 lb 15 oz)  13. SIRS. Resolved.   Antibiotics completed. Blood cultures no growth. Monitor for any fever  14. Hypertension. Coreg 12.5 mg twice daily, decreased to 6.25 on 12/11, DC'd on 12/14    Vitals:   03/24/17 1700 03/24/17 1814  BP: 121/60 (!) 135/53  Pulse: 91 89  Resp: 18 20  Temp:  97.7 F (36.5 C)  SpO2:  98%     Symptomatic orthostasis, abdominal binder   Encourage  fluids    Ordered low dose Midodrin on 12/13 15. New onset atrial fibrillation. Follow-up cardiology services. Coreg 6.25  DC'd on 12/14 16. Hyperlipidemia. Lipitor 17. Constipation. Laxative assistance  LOS (Days) 11 A FACE TO FACE EVALUATION WAS PERFORMED  Terianne Thaker T 03/25/2017 8:39 AM

## 2017-03-25 NOTE — Progress Notes (Signed)
[facility] Progress Note   Subjective:  Had starting spell yesterday, and this am was nauseous after eating lunch.  Felt better after 1L NS bolus last night.  BP's ok today, HR up 90's.  No CP, abd pain, no fevers, no cloudy fluid.   Objective Vitals:   03/24/17 1814 03/25/17 0815 03/25/17 0847 03/25/17 1310  BP: (!) 135/53 (!) 118/59  (!) 111/46  Pulse: 89 (!) 106  97  Resp: 20 16    Temp: 97.7 F (36.5 C) 98.1 F (36.7 C)  98 F (36.7 C)  TempSrc: Oral   Oral  SpO2: 98% 100%    Weight: 73 kg (160 lb 15 oz) 53 kg (116 lb 13.5 oz) 75 kg (165 lb 5.5 oz)    Physical Exam General:NAD, WDWN elderly male Heart:RRR, no rub or gallop Lungs:CTAB, no wheeze, rales or rhonchi Abdomen:soft, NT, ND Extremities:no hip or LE edema, L BKA and new R BKA Dialysis Access: PD cath, L AVF +thrill   Filed Weights   03/24/17 1814 03/25/17 0815 03/25/17 0847  Weight: 73 kg (160 lb 15 oz) 53 kg (116 lb 13.5 oz) 75 kg (165 lb 5.5 oz)    Intake/Output Summary (Last 24 hours) at 03/25/2017 1508 Last data filed at 03/25/2017 1420 Gross per 24 hour  Intake [id number] ml  Output 16027 ml  Net -553 ml    Additional Objective Labs: Basic Metabolic Panel: Recent Labs  Lab 03/19/17 0603 03/24/17 0525 03/25/17 0951  NA 129* 131* 128*  K 4.0 3.3* 4.1  CL 92* 90* 91*  CO2 27 29 28   GLUCOSE 294* 303* 289*  BUN 63* 54* 53*  CREATININE 5.00* 4.77* 4.50*  CALCIUM 8.3* 8.7* 8.0*  PHOS 3.6  --  3.5   Liver Function Tests: Recent Labs  Lab 03/19/17 0603 03/25/17 0951  ALBUMIN 1.5* 1.6*   CBC: Recent Labs  Lab 03/19/17 0603 03/23/17 0648 03/24/17 0525  WBC 8.9 9.2 8.2  NEUTROABS  --  6.3  --   HGB 8.8* 10.3* 10.2*  HCT 28.4* 32.5* 32.9*  MCV 94.7 94.5 95.1  PLT 468* 497* 486*  CBG: Recent Labs  Lab 03/24/17 1213 03/24/17 1611 03/24/17 2131 03/25/17 0702 03/25/17 1250  GLUCAP 189* 176* 172* 229* 255*   Medications: . sodium chloride     . aspirin EC  81 mg  Oral Daily  . atorvastatin  80 mg Oral q1800  . calcium acetate  667 mg Oral TID WC  . chlorhexidine  15 mL Mouth/Throat BID  . clopidogrel  75 mg Oral Daily  . darbepoetin (ARANESP) injection - NON-DIALYSIS  200 mcg Subcutaneous Q Sat-1800  . docusate sodium  100 mg Oral BID  . feeding supplement (NEPRO CARB STEADY)  237 mL Oral BID BM  . feeding supplement (PRO-STAT SUGAR FREE 64)  30 mL Oral BID  . gentamicin cream  1 application Topical Daily  . insulin aspart  0-8 Units Subcutaneous TID WC  . [START ON 03/26/2017] insulin NPH Human  46 Units Subcutaneous QAC breakfast  . insulin NPH Human  52 Units Subcutaneous QHS  . midodrine  2.5 mg Oral BID WC  . multivitamin  1 tablet Oral Daily  . potassium chloride  30 mEq Oral Daily  . [START ON 03/30/2017] Vitamin D (Ergocalciferol)  50,000 Units Oral Q30 days    Dialysis Orders:  [facility] [place] [facility] 6 exchanges/day one mid-day exchange (occurs around 9am).  2.5 liters of 2.5% per [facility]CPD 2  liters of 2.5% for last fill and mid day drain. Dr. Denny PeonBaveja.  -baseline weight for him pre amputation was around 190lbs ~86kg  Assessment/Plan: 1. 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 ESRD - on [facility]CPD in [place], orders written. 3. Anemia of CKD- Hgb 10.2, follow trend. Aranesp 200mcg q wk  4. Secondary hyperparathyroidism -P 3.6 Cont to hold phoslo, Follow. No Vit D.  5. HTN/vol - still prob a bit dry, will give another 1L NS and continue all 1.5% fluids 6. Nutrition - severe malnutrition, Alb 1.5, Prostat +Nepro 7. A fib w/ RVR -per primary 8. DMT2 - per primary 9. Hypokalemia - K 3.3, continue supplementation   Vinson Moselleob Gerron Guidotti MD [facility] pgr [phone]   03/25/2017, 3:10 PM

## 2017-03-25 NOTE — Progress Notes (Signed)
Spoke with Rn, states pt has no need for midline IV catheter.  Has PIV for IV needs.

## 2017-03-25 NOTE — Progress Notes (Signed)
Occupational Therapy Session Note  Patient Details  Name: Joshua [name] MRN: [id number] Date of Birth: [birth year]  Today's Date: 03/25/2017 OT Individual Time: 1435-1530 OT Individual Time Calculation (min): 55 min    Short Term Goals: Week 2:  OT Short Term Goal 1 (Week 2): Continue working on established LTGs set at overall min assist level.   Skilled Therapeutic Interventions/Progress Updates:    Pt presented sitting up in TIS w/c, ready for OT tx session, reporting improvements in symptoms of dizziness today. Pt steers motor w/c to rehab gym with min verbal cues for avoiding obstacles. Seated in w/c in therapy gym Pt engaged in bil UE therapeutic exercise for increased UB/core strengthening and endurance using 3lb dowel rod for 10 reps each exercise, covering all UE planes and incorporating trunk forward flexion and rotation. Pt engaged in seated horseshoe game for further focus on UE strengthening/endurance, alternating between L and R UE. Pt transports back to room in manner described above. Once in room focus on practicing slide board transfers to/from drop arm BSC as Pt/spouse reporting increased difficulty completing this transfer. [facility], MaxA for transfer back to w/c due to transferring slightly uphill, increased time/effort. Pt left positioned and reclined in TIS w/c, needs within reach, spouse present.    Therapy Documentation Precautions:  Precautions Precautions: Fall Restrictions Weight Bearing Restrictions: Yes RLE Weight Bearing: Non weight bearing RLE Partial Weight Bearing Percentage or Pounds: BKA LLE Weight Bearing: Non weight bearing LLE Partial Weight Bearing Percentage or Pounds: BKA    Pain: Pain Assessment Pain Assessment: Faces Pain Score: 1  Faces Pain Scale: Hurts even more Pain Type: Acute pain Pain Location: Leg Pain Orientation: Right Pain Descriptors / Indicators: Aching Pain Onset: On-going Pain  Intervention(s): Repositioned;Rest    See Function Navigator for Current Functional Status.   Therapy/Group: Individual Therapy  Orlando PennerBreanna L Gladie Gravette 03/25/2017, 3:54 PM

## 2017-03-25 NOTE — Progress Notes (Signed)
PD treatment completed overnight with no issues per patient.  UF 993 mL.  Catheter clamped, capped, and secured on patient with belt.  Bed scale with questionable accuracy, 57 kg down from 73 kg a few days ago.  Will continue to monitor.

## 2017-03-26 LAB — BASIC METABOLIC PANEL
ANION GAP: 10 (ref 5–15)
BUN: 50 mg/dL — ABNORMAL HIGH (ref 6–20)
CALCIUM: 8.3 mg/dL — AB (ref 8.9–10.3)
CHLORIDE: 96 mmol/L — AB (ref 101–111)
CO2: 26 mmol/L (ref 22–32)
Creatinine, Ser: 4.3 mg/dL — ABNORMAL HIGH (ref 0.61–1.24)
GFR calc non Af Amer: 12 mL/min — ABNORMAL LOW (ref >60)
GFR, EST AFRICAN AMERICAN: 14 mL/min — AB (ref >60)
GLUCOSE: 143 mg/dL — AB (ref 65–99)
Potassium: 4 mmol/L (ref 3.5–5.1)
Sodium: 132 mmol/L — ABNORMAL LOW (ref 135–145)

## 2017-03-26 LAB — CBC
HEMATOCRIT: 30.8 % — AB (ref 39.0–52.0)
HEMOGLOBIN: 9.5 g/dL — AB (ref 13.0–17.0)
MCH: 29.2 pg (ref 26.0–34.0)
MCHC: 30.8 g/dL (ref 30.0–36.0)
MCV: 94.8 fL (ref 78.0–100.0)
Platelets: 422 10*3/uL — ABNORMAL HIGH (ref 150–400)
RBC: 3.25 MIL/uL — AB (ref 4.22–5.81)
RDW: 16.7 % — ABNORMAL HIGH (ref 11.5–15.5)
WBC: 10.1 10*3/uL (ref 4.0–10.5)

## 2017-03-26 LAB — GLUCOSE, CAPILLARY
GLUCOSE-CAPILLARY: 163 mg/dL — AB (ref 65–99)
Glucose-Capillary: 156 mg/dL — ABNORMAL HIGH (ref 65–99)
Glucose-Capillary: 194 mg/dL — ABNORMAL HIGH (ref 65–99)
Glucose-Capillary: 209 mg/dL — ABNORMAL HIGH (ref 65–99)

## 2017-03-26 MED ORDER — MUSCLE RUB 10-15 % EX CREA
TOPICAL_CREAM | CUTANEOUS | Status: DC | PRN
  Filled 2017-03-26: qty 85

## 2017-03-26 MED ORDER — PREMIER PROTEIN SHAKE
2.0000 [oz_av] | Freq: Four times a day (QID) | ORAL | Status: DC
  Administered 2017-03-26 – 2017-03-28 (×7): 2 [oz_av] via ORAL
  Filled 2017-03-26 (×15): qty 325.31

## 2017-03-26 NOTE — Progress Notes (Signed)
[facility] Progress Note   Subjective:  Still nauseous, no further lightheadness.  No SOB or swelling.   Objective Vitals:   03/25/17 1800 03/25/17 2212 03/26/17 0516 03/26/17 0920  BP: (!) 120/57 (!) 137/97 126/63 (!) 96/54  Pulse: 85 (!) 54 (!) 105 (!) 114  Resp: 16 18 18 18   Temp: 97.7 F (36.5 C) 97.6 F (36.4 C) 98.8 F (37.1 C) 98.7 F (37.1 C)  TempSrc: Oral Oral Oral Oral  SpO2: 100% 100% 99%   Weight: 63 kg (138 lb 14.2 oz)  56 kg (123 lb 7.3 oz)    Physical Exam General:NAD, WDWN elderly male Heart:RRR, no rub or gallop Lungs:CTAB, no wheeze, rales or rhonchi Abdomen:soft, NT, ND Extremities:no hip or LE edema, L BKA and new R BKA Dialysis Access: PD cath, L AVF +thrill   Filed Weights   03/25/17 0847 03/25/17 1800 03/26/17 0516  Weight: 75 kg (165 lb 5.5 oz) 63 kg (138 lb 14.2 oz) 56 kg (123 lb 7.3 oz)    Intake/Output Summary (Last 24 hours) at 03/26/2017 1156 Last data filed at 03/26/2017 0920 Gross per 24 hour  Intake [id number] ml  Output 13447 ml  Net -712 ml    Additional Objective Labs: Basic Metabolic Panel: Recent Labs  Lab 03/24/17 0525 03/25/17 0951 03/26/17 0630  NA 131* 128* 132*  K 3.3* 4.1 4.0  CL 90* 91* 96*  CO2 29 28 26   GLUCOSE 303* 289* 143*  BUN 54* 53* 50*  CREATININE 4.77* 4.50* 4.30*  CALCIUM 8.7* 8.0* 8.3*  PHOS  --  3.5  --    Liver Function Tests: Recent Labs  Lab 03/25/17 0951  ALBUMIN 1.6*   CBC: Recent Labs  Lab 03/23/17 0648 03/24/17 0525 03/26/17 0630  WBC 9.2 8.2 10.1  NEUTROABS 6.3  --   --   HGB 10.3* 10.2* 9.5*  HCT 32.5* 32.9* 30.8*  MCV 94.5 95.1 94.8  PLT 497* 486* 422*  CBG: Recent Labs  Lab 03/25/17 1250 03/25/17 1648 03/25/17 2108 03/26/17 0705 03/26/17 1140  GLUCAP 255* 134* 96 156* 194*   Medications:  . aspirin EC  81 mg Oral Daily  . atorvastatin  80 mg Oral q1800  . calcium acetate  667 mg Oral TID WC  . chlorhexidine  15 mL Mouth/Throat BID  .  clopidogrel  75 mg Oral Daily  . darbepoetin (ARANESP) injection - NON-DIALYSIS  200 mcg Subcutaneous Q Sat-1800  . docusate sodium  100 mg Oral BID  . feeding supplement (PRO-STAT SUGAR FREE 64)  30 mL Oral BID  . gentamicin cream  1 application Topical Daily  . insulin aspart  0-8 Units Subcutaneous TID WC  . insulin NPH Human  46 Units Subcutaneous QAC breakfast  . insulin NPH Human  52 Units Subcutaneous QHS  . midodrine  2.5 mg Oral BID WC  . multivitamin  1 tablet Oral Daily  . potassium chloride  30 mEq Oral Daily  . protein supplement shake  2 oz Oral QID  . [START ON 03/30/2017] Vitamin D (Ergocalciferol)  50,000 Units Oral Q30 days    Dialysis Orders:  [facility] [place] [facility]. 6 exchanges/day one mid-day exchange (occurs around 9am).  2.5 liters of 2.5% per CCPD 2 liters of 2.5% for last fill and mid day drain. Dr. Denny PeonBaveja.  -baseline weight for him pre amputation was around 190lbs ~86kg  Assessment/Plan: 1. 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 ESRD -  on [facility]D in [place], orders written 3. HTN/vol - symptomatic hypotension this week, lowered UF to all 1.5% and gave 1L NS x 2.  BP up and serum Na better.  Not eating a lot. Cont same UF for now.  4. Secondary hyperparathyroidism -P 3.6 Cont to hold phoslo, Follow. No Vit D.  5. Anemia of CKD- Hgb 10.2, follow trend. Aranesp 200mcg q wk 6. Nutrition 6. Severe malnutrition, Alb 1.5, Prostat +Nepro 7. A fib w/ RVR -per primary 8. DMT2 - per primary 9. Hypokalemia - better, K 4.0, cont KDur 30 /day   Vinson Moselleob Krystan Northrop MD [facility] pgr [phone]   03/26/2017, 11:56 AM

## 2017-03-26 NOTE — Progress Notes (Signed)
Patient refused scheduled NPH insulin as well as all other HS medications.  Stated that he was nauseated and didn't eat dinner.  Blood sugar 96.  Received zofran previously as ordered with some relief.  Attempted to eat applesauce and vomited.  Sipping ginger ale earlier in shift.  Wife remains at bedside.    [name], [name]

## 2017-03-26 NOTE — Progress Notes (Signed)
Patient placed himself on home CPAP unit for the night.  

## 2017-03-26 NOTE — Plan of Care (Signed)
  Not Progressing RH SKIN INTEGRITY RH STG ABLE TO PERFORM INCISION/WOUND CARE W/ASSISTANCE Description STG Able To Perform Incision/Wound Care With Assistance. Min  Total assist with dressing changes.  03/26/2017 0232 - Not Progressing by Martina SinnerMurray, Apolonia Ellwood A, RN

## 2017-03-26 NOTE — Progress Notes (Signed)
[place] [facility]     PROGRESS NOTE  Subjective/Complaints:  Complains of back and sometimes neck pain when he sitting up for prolonged periods of time.  Sometimes when in bed it bothers him as well.  ROS: pt denies nausea, vomiting, diarrhea, cough, shortness of breath or chest pain   Objective: Vital Signs: Blood pressure 126/63, pulse (!) 105, temperature 98.8 F (37.1 C), temperature source Oral, resp. rate 18, weight 56 kg (123 lb 7.3 oz), SpO2 99 %. No results found. Recent Labs    03/24/17 0525 03/26/17 0630  WBC 8.2 10.1  HGB 10.2* 9.5*  HCT 32.9* 30.8*  PLT 486* 422*   Recent Labs    03/25/17 0951 03/26/17 0630  NA 128* 132*  K 4.1 4.0  CL 91* 96*  GLUCOSE 289* 143*  BUN 53* 50*  CREATININE 4.50* 4.30*  CALCIUM 8.0* 8.3*   CBG (last 3)  Recent Labs    03/25/17 1648 03/25/17 2108 03/26/17 0705  GLUCAP 134* 96 156*    Wt Readings from Last 3 Encounters:  03/26/17 56 kg (123 lb 7.3 oz)  03/14/17 75 kg (165 lb 5.5 oz)  03/12/17 78 kg (171 lb 15.3 oz)    Physical Exam:  BP 126/63 (BP Location: Right Arm)   Pulse (!) 105   Temp 98.8 F (37.1 C) (Oral)   Resp 18   Wt 56 kg (123 lb 7.3 oz) Comment: took everything off bed  SpO2 99%   BMI 18.23 kg/m  Constitutional: He appears well-developed and well-nourished.  HENT:  Head: Normocephalic.  Eyes: EOM are normal. No discharge.  Cardiovascular: RRR without murmur. No JVD  . Respiratory: CTA Bilaterally without wheezes or rales. Normal effort  .   GI: Bowel sounds are normal. He exhibits no distension.   Musculoskeletal: Generalized low back pain Neurological: He is alert and oriented.  Motor: B/l UE 4+/5 proximal to distal RLE: HF 4/5 (stable) LLE: HF 4/5 (stable) Skin:  See above Left BKA with dressing c/d/i Right BKA with dressing c/d/i  Assessment/Plan: 1. Functional deficits secondary to bilateral BKA which require 3+ hours per day of interdisciplinary therapy  in a comprehensive inpatient rehab setting. Physiatrist is providing close team supervision and 24 hour management of active medical problems listed below. Physiatrist and rehab team continue to assess barriers to discharge/monitor patient progress toward functional and medical goals.  Function:  Bathing Bathing position   Position: Sitting EOB  Bathing parts Body parts bathed by patient: Right arm, Left arm, Chest, Abdomen, Front perineal area, Buttocks, Right upper leg, Left upper leg, Left lower leg Body parts bathed by helper: Back  Bathing assist Assist Level: Touching or steadying assistance(Pt > 75%)      Upper Body Dressing/Undressing Upper body dressing   What is the patient wearing?: Pull over shirt/dress     Pull over shirt/dress - Perfomed by patient: Thread/unthread right sleeve, Thread/unthread left sleeve, Put head through opening, Pull shirt over trunk Pull over shirt/dress - Perfomed by helper: Put head through opening, Pull shirt over trunk        Upper body assist Assist Level: Supervision or verbal cues      Lower Body Dressing/Undressing Lower body dressing   What is the patient wearing?: Pants, Underwear Underwear - Performed by patient: Thread/unthread right underwear leg, Thread/unthread left underwear leg, Pull underwear up/down Underwear - Performed by helper: Pull underwear up/down Pants- Performed by patient: Thread/unthread right pants leg, Thread/unthread left pants leg, Pull  pants up/down Pants- Performed by helper: Pull pants up/down                      Lower body assist Assist for lower body dressing: Touching or steadying assistance (Pt > 75%)      Toileting Toileting Toileting activity did not occur: Safety/medical concerns Toileting steps completed by patient: Adjust clothing prior to toileting Toileting steps completed by helper: Adjust clothing prior to toileting, Performs perineal hygiene, Adjust clothing after  toileting Toileting Assistive Devices: Toilet aid  Toileting assist Assist level: Two helpers   Transfers Chair/bed transfer   Chair/bed transfer method: Lateral scoot Chair/bed transfer assist level: Maximal assist (Pt 25 - 49%/lift and lower) Chair/bed transfer assistive device: Sliding board     Locomotion Ambulation Ambulation activity did not occur: N/A         Wheelchair   Type: Motorized Max wheelchair distance: 150' Assist Level: Supervision or verbal cues  Cognition Comprehension Comprehension assist level: Understands basic 90% of the time/cues < 10% of the time  Expression Expression assist level: Expresses basic 90% of the time/requires cueing < 10% of the time.  Social Interaction Social Interaction assist level: Interacts appropriately 75 - 89% of the time - Needs redirection for appropriate language or to initiate interaction.  Problem Solving Problem solving assist level: Solves basic 75 - 89% of the time/requires cueing 10 - 24% of the time  Memory Memory assist level: Recognizes or recalls 75 - 89% of the time/requires cueing 10 - 24% of the time    Medical Problem List and Plan: 1. 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 Bilateral wound vacs discontinued 03/05/2017 per Dr. Lajoyce Corners   Continue CIR   Right BKA evaluated by Ortho, appreciate recs, continue current management.   2. DVT Prophylaxis/Anticoagulation: Heparin per nephrology 3. Pain Management: Flexeril and Oxycodone as needed   -Added K pad and muscle rub for back and neck pain 4. Mood: Provide emotional support 5. Neuropsych: This patient is capable of making decisions on his own behalf. 6. Skin/Wound Care: Routine skin checks 7. Fluids/Electrolytes/Nutrition: Routine I&O's 8. Acute on chronic anemia. Continue Aranesp   Hemoglobin 10.2 on 12/14   Cont to monitor 9. End-stage renal disease/CPPD. Follow-up renal services   PD being adjusted by  nephro, appreciate recs 10. Diabetes mellitus peripheral neuropathy. Latest hemoglobin A1c 8.8.   Patient brittle diabetic with significant changes with mild adjustments to insulin   Check blood sugars before meals and at bedtime.    NPH insulin 65 units daily at breakfast, decreased to 55 on 12/7, decreased to 45 on 12/11--- on 12/15 increased morning Lantus to 46 units    NPH at bedtime increased to 52 units on 12/15   Sliding scale changed to sensitive   Some improvement over the last 24 hours but still brittle CBG (last 3)  Recent Labs    03/25/17 1648 03/25/17 2108 03/26/17 0705  GLUCAP 134* 96 156*   11. CAD with CABG. Continue Plavix and aspirin. No chest pain or shortness of breath 12. Diastolic congestive heart failure. Monitor for any signs of fluid overload Filed Weights   03/25/17 0847 03/25/17 1800 03/26/17 0516  Weight: 75 kg (165 lb 5.5 oz) 63 kg (138 lb 14.2 oz) 56 kg (123 lb 7.3 oz)  13. SIRS. Resolved.   Antibiotics completed. Blood cultures no growth. Monitor for any fever  14. Hypertension. Coreg 12.5 mg twice daily, decreased to  6.25 on 12/11, DC'd on 12/14    Vitals:   03/25/17 2212 03/26/17 0516  BP: (!) 137/97 126/63  Pulse: (!) 54 (!) 105  Resp: 18 18  Temp: 97.6 F (36.4 C) 98.8 F (37.1 C)  SpO2: 100% 99%     Symptomatic orthostasis, abdominal binder   Encourage fluids    Ordered low dose Midodrin on 12/13 with some improvement 15. New onset atrial fibrillation. Follow-up cardiology services. Coreg 6.25  DC'd on 12/14 16. Hyperlipidemia. Lipitor 17. Constipation. Laxative assistance  LOS (Days) 12 A FACE TO FACE EVALUATION WAS PERFORMED  SWARTZ,ZACHARY T 03/26/2017 8:20 AM

## 2017-03-27 ENCOUNTER — Inpatient Hospital Stay (HOSPITAL_COMMUNITY): Payer: Medicare Other

## 2017-03-27 ENCOUNTER — Inpatient Hospital Stay (HOSPITAL_COMMUNITY): Payer: Medicare Other | Admitting: Physical Therapy

## 2017-03-27 ENCOUNTER — Encounter (HOSPITAL_COMMUNITY): Payer: Medicare Other | Admitting: Psychology

## 2017-03-27 ENCOUNTER — Inpatient Hospital Stay (HOSPITAL_COMMUNITY): Payer: Medicare Other | Admitting: Occupational Therapy

## 2017-03-27 LAB — GLUCOSE, CAPILLARY
GLUCOSE-CAPILLARY: 159 mg/dL — AB (ref 65–99)
GLUCOSE-CAPILLARY: 155 mg/dL — AB (ref 65–99)
GLUCOSE-CAPILLARY: 172 mg/dL — AB (ref 65–99)
Glucose-Capillary: 227 mg/dL — ABNORMAL HIGH (ref 65–99)

## 2017-03-27 NOTE — Consult Note (Signed)
Neuropsychological Consultation   Patient:   [name]   DOB:   08/26/1940  MR Number:  [id number]  [facility]  [facility] [address] [facility] [address] [id number] [address] [place] [address] Dept: [phone] Loc: [phone]           Date of Service:   03/27/2017  Start Time:   9 AM End Time:   10 AM  Provider/Observer:  Arley PhenixJohn Rillie Riffel, Psy.D.       Clinical Neuropsychologist       Billing Code/Service: [phone] 4 Units  Chief Complaint:    [name] has been coping with his second BKA and revision of first one.  The patient has done better with this surgery than the first, but still is coping with significant loss of function.  Reason for Service:  HPI: [name] is a 76 y.o. male with history of CAD status post CABG, ESRD on peritoneal dialysis, sleep apnea, diabetes mellitus type 2 who has had recent right BKA and also a few days ago left BKA was [facility] was found to be acutely confused and also per the patient and patient wife patient was short of breath.  Chest x-ray  done at that time did not show anything acute.  As per the transferring physician's note patient was in new onset A. fib with RVR and was given Cardizem CD.  On my exam patient appears not in distress states his shortness of breath is still present but improved.  Denies any chest pain.  As per the wife patient did not appear confused to her.  Patient is following commands and moves all extremities and oriented to time place and person.  Denies any abdominal pain nausea vomiting.Patient is afebrile.    Current Status:  The patient reports that he is coping better than after his first amputation, but knows that he still has a lot of challenges ahead of him.  He denies any significant depressive or anxiety symptoms.  Medical History:   Past Medical History:  Diagnosis Date  . AICD (automatic  cardioverter/defibrillator) present    [employer] [phone] lead [id number]  . CHF (congestive heart failure) (HCC)   . Complication of anesthesia   . COPD (chronic obstructive pulmonary disease) (HCC)   . Coronary artery disease    CABG 2011  . Diabetes mellitus without complication (HCC)   . ESRD on peritoneal dialysis [facility](HCC)    [place].   . Gangrene (HCC)    left knee  . GERD (gastroesophageal reflux disease)   . History of peritonitis    PD cath related peritonitis in April 2017  . Hypertension   . Peripheral vascular disease (HCC)   . PONV (postoperative nausea and vomiting)   . Sjogren's syndrome (HCC)    Per pt's wife, was diagnosed in 2016 during w/u for cause of renal failure prior to starting dialysis.  He had a rash on his chest apparently prompting this w/u.  Never had renal bx.  He was referred to [place] rheum [place] in [place] and per the wife he was treated with MTX and other medications but had side effects to "all of it" and isn't taking anything for this as of Jun 2017.   Joshua Kitchen. Sleep apnea    wears Bipap  . Stroke (HCC)   . Wound dehiscence    Right  foot       Family Med/Psych History:  Family History  Problem Relation Age of Onset  . Heart disease Father   . Diabetes Mother     Risk of Suicide/Violence: virtually non-existent Pt denies SI or HI  Impression/DX:  [name] has been coping with his second BKA and revision of first one.  The patient has done better with this surgery than the first, but still is coping with significant loss of function.  No significant symptoms of depression, anxiety, etc.  Mental Status Good.   Diagnosis:    S/P bilateral below knee amputation [facility](HCC) - Plan: Ambulatory referral to Physical Medicine Rehab         Electronically Signed   _______________________ Arley PhenixJohn Timothey Dahlstrom, Psy.D.

## 2017-03-27 NOTE — Progress Notes (Signed)
Physical Therapy Session Note  Patient Details  Name: Joshua KitchenJames D Graff MRN: [id number] Date of Birth: 06/05/1940  Today's Date: 03/27/2017 PT Individual Time: 1300-1410, [id number] PT Individual Time Calculation (min): 70 min , 10 min  Missed Time: 35 min   Short Term Goals: Week 2:  PT Short Term Goal 1 (Week 2): =LTGs due to ELOS  Skilled Therapeutic Interventions/Progress Updates:    Session 1: Pt seated in TIS w/c upon PT arrival, agreeable to therapy and reports pain 10/10 in R distal LE, pt trying to hold off on pain med. Pt performed power chair mobility from room>gym with supervision. Pt transferred from w/c>mat with min assist using slideboard. Pt seated edge of mat worked on anterior/posterior scooting, x 10 sitting>sidelying on elbow each side, and x 10 partial crunches from reclined wedge. Pt transferred from sitting>supine>sidelying>prone with min assist. Pt in prone for hip flexor stretch, discussed importance of spending time in prone for hip ROM with both wife and pt. In prone pt also performed x 10 hip extension with each LE for strengthening. Pt performed A-P transfer from mat back to w/c with min assist and verbal cues for techniques, discussed using this technique at home to get OOB. Pt performed power mobility back to room and left seated in chair with needs in reach.    Session 2: Pt reclined in TIS w/c upon PT arrival, eyes closed and with wet washcloth on forehead. Pt stated that he had been feeling nauseous this afternoon since his last therapy. RN already made aware. Therapist assisted pt to transfer back to bed. Pt set up w/c for transfer. Pt transferred from w/c>bed slideboard transfer with min assist. Pt transferred sitting>supine with min assist. Pt left supine in bed with needs in reach and wife present. Pt missed 35 minutes of skilled therapy tx this session due to nausea.     Therapy Documentation Precautions:  Precautions Precautions: Fall Restrictions Weight  Bearing Restrictions: Yes RLE Weight Bearing: Non weight bearing RLE Partial Weight Bearing Percentage or Pounds: BKA LLE Weight Bearing: Non weight bearing LLE Partial Weight Bearing Percentage or Pounds: BKA    See Function Navigator for Current Functional Status.   Therapy/Group: Individual Therapy  Cresenciano GenreEmily van Schagen, PT, DPT 03/27/2017, 12:55 PM

## 2017-03-27 NOTE — Progress Notes (Signed)
Physical Therapy Session Note  Patient Details  Name: Joshua [name] MRN: [id number] Date of Birth: [birth year]  Today's Date: 03/27/2017 PT Individual Time: 1115-1200 PT Individual Time Calculation (min): 45 min   Short Term Goals: Week 2:  PT Short Term Goal 1 (Week 2): =LTGs due to ELOS  Skilled Therapeutic Interventions/Progress Updates:    Pt seated in w/c in room, agreeable to participate in therapy session. Pt reports no pain this AM. Pt is able to maneuver power w/c his room to/from gym with Supervision, v/c for safety. Pt requires Min Assist to line w/c up next to transfer surface safely. Sliding board transfer w/c to recliner to simulate home environment with Mod Assist, v/c for safety and for proper weight shift during transfer. Sliding board transfer recliner to w/c with Max Assist due to significant height difference. Attempt to have pt's wife perform transfer w/c to/from recliner. Pt's wife requires Min Assist from therapist during transfer and is not safe to transfer pt to/from recliner by herself at this time. Sliding board transfer to/from level mat with Mod Assist, focus on pt using his UE more to assist with transfer and for proper weight shift forwards during transfer. Pt left seated in w/c in room with needs in reach at end of session, wife present.  Therapy Documentation Precautions:  Precautions Precautions: Fall Restrictions Weight Bearing Restrictions: Yes RLE Weight Bearing: Non weight bearing RLE Partial Weight Bearing Percentage or Pounds: BKA LLE Weight Bearing: Non weight bearing LLE Partial Weight Bearing Percentage or Pounds: BKA   See Function Navigator for Current Functional Status.   Therapy/Group: Individual Therapy  [name], PT, DPT  03/27/2017, 12:23 PM

## 2017-03-27 NOTE — Progress Notes (Signed)
[place] PHYSICAL MEDICINE & REHABILITATION     PROGRESS NOTE  Subjective/Complaints:  Pt seen laying in bed this AM.  He slept well overnight.  He had a good weekend.  Wife in agreement.   ROS: Denies nausea, vomiting, diarrhea, shortness of breath or chest pain   Objective: Vital Signs: Blood pressure (!) 144/59, pulse 71, temperature 98 F (36.7 C), temperature source Oral, resp. rate 18, weight 56 kg (123 lb 7.3 oz), SpO2 98 %. No results found. Recent Labs    03/26/17 0630  WBC 10.1  HGB 9.5*  HCT 30.8*  PLT 422*   Recent Labs    03/25/17 0951 03/26/17 0630  NA 128* 132*  K 4.1 4.0  CL 91* 96*  GLUCOSE 289* 143*  BUN 53* 50*  CREATININE 4.50* 4.30*  CALCIUM 8.0* 8.3*   CBG (last 3)  Recent Labs    03/26/17 1636 03/26/17 2033 03/27/17 0646  GLUCAP 163* 209* 227*    Wt Readings from Last 3 Encounters:  03/27/17 56 kg (123 lb 7.3 oz)  03/14/17 75 kg (165 lb 5.5 oz)  03/12/17 78 kg (171 lb 15.3 oz)    Physical Exam:  BP (!) 144/59 (BP Location: Right Arm)   Pulse 71   Temp 98 F (36.7 C) (Oral)   Resp 18   Wt 56 kg (123 lb 7.3 oz) Comment: took eveything off bed  SpO2 98%   BMI 18.23 kg/m  Constitutional: He appears well-developed and well-nourished.  HENT:  Head: Normocephalic. Healing ulcer on nasal bridge.  Eyes: EOM are normal. No discharge.  Cardiovascular: RRR. No JVD  . Respiratory: CTA Bilaterally. Normal effort.   GI: Bowel sounds are normal. He exhibits no distension.   Musculoskeletal: No edema. No tenderness. Neurological: He is alert and oriented.  Motor: B/l UE 4+/5 proximal to distal RLE: HF 4/5 (unchanged) LLE: HF 4/5 (unchanged) Skin:  See above Left BKA with dressing c/d/i Right BKA with ischemia along incision line, c/d/i  Assessment/Plan: 1. 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 Physiatrist is providing  close team supervision and 24 hour management of active medical problems listed below. Physiatrist and rehab team continue to assess barriers to discharge/monitor patient progress toward functional and medical goals.  Function:  Bathing Bathing position   Position: Sitting EOB  Bathing parts Body parts bathed by patient: Right arm, Left arm, Chest, Abdomen, Front perineal area, Buttocks, Right upper leg, Left upper leg, Left lower leg Body parts bathed by helper: Back  Bathing assist Assist Level: Touching or steadying assistance(Pt > 75%)      Upper Body Dressing/Undressing Upper body dressing   What is the patient wearing?: Pull over shirt/dress     Pull over shirt/dress - Perfomed by patient: Thread/unthread right sleeve, Thread/unthread left sleeve, Put head through opening, Pull shirt over trunk Pull over shirt/dress - Perfomed by helper: Put head through opening, Pull shirt over trunk        Upper body assist Assist Level: Supervision or verbal cues      Lower Body Dressing/Undressing Lower body dressing   What is the patient wearing?: Pants, Underwear Underwear - Performed by patient: Thread/unthread right underwear leg, Thread/unthread left underwear leg, Pull underwear up/down Underwear - Performed by helper: Pull underwear up/down Pants- Performed by patient: Thread/unthread right pants leg, Thread/unthread left pants leg, Pull pants up/down Pants- Performed by helper: Pull pants up/down  Lower body assist Assist for lower body dressing: Touching or steadying assistance (Pt > 75%)      Toileting Toileting Toileting activity did not occur: Safety/medical concerns Toileting steps completed by patient: Adjust clothing prior to toileting Toileting steps completed by helper: Adjust clothing prior to toileting, Performs perineal hygiene, Adjust clothing after toileting Toileting Assistive Devices: Toilet aid  Toileting assist Assist level:  Two helpers   Transfers Chair/bed transfer   Chair/bed transfer method: Lateral scoot Chair/bed transfer assist level: Maximal assist (Pt 25 - 49%/lift and lower) Chair/bed transfer assistive device: Sliding board     Locomotion Ambulation Ambulation activity did not occur: N/A         Wheelchair   Type: Motorized Max wheelchair distance: 150' Assist Level: Supervision or verbal cues  Cognition Comprehension Comprehension assist level: Understands basic 90% of the time/cues < 10% of the time  Expression Expression assist level: Expresses basic 90% of the time/requires cueing < 10% of the time.  Social Interaction Social Interaction assist level: Interacts appropriately 75 - 89% of the time - Needs redirection for appropriate language or to initiate interaction.  Problem Solving Problem solving assist level: Solves basic 75 - 89% of the time/requires cueing 10 - 24% of the time  Memory Memory assist level: Recognizes or recalls 75 - 89% of the time/requires cueing 10 - 24% of the time    Medical Problem List and Plan: 1. 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 Bilateral wound vacs discontinued 03/05/2017 per Dr. Lajoyce Corners   Continue CIR   Right BKA evaluated by Ortho, appreciate recs, continue current management.   2. DVT Prophylaxis/Anticoagulation: Heparin per nephrology 3. Pain Management: Flexeril and Oxycodone as needed   Added K pad and muscle rub for back and neck pain 4. Mood: Provide emotional support 5. Neuropsych: This patient is capable of making decisions on his own behalf. 6. Skin/Wound Care: Routine skin checks 7. Fluids/Electrolytes/Nutrition: Routine I&O's 8. Acute on chronic anemia. Continue Aranesp   Hemoglobin 9.5 on 12/16   Cont to monitor 9. End-stage renal disease/CPPD. Follow-up renal services   PD being adjusted by nephro, appreciate recs 10. Diabetes mellitus peripheral neuropathy. Latest hemoglobin A1c  8.8.   Patient brittle diabetic with significant changes with mild adjustments to insulin   Check blood sugars before meals and at bedtime.    NPH insulin 65 units daily at breakfast, decreased to 55 on 12/7, decreased to 45 on 12/11, increased to 46 on 12/15     NPH at bedtime increased to 52 units on 12/15   Sliding scale changed to sensitive CBG (last 3)  Recent Labs    03/26/17 1636 03/26/17 2033 03/27/17 0646  GLUCAP 163* 209* 227*    Overall improving on 12/17 11. CAD with CABG. Continue Plavix and aspirin. No chest pain or shortness of breath 12. Diastolic congestive heart failure. Monitor for any signs of fluid overload Filed Weights   03/25/17 1800 03/26/17 0516 03/27/17 0532  Weight: 63 kg (138 lb 14.2 oz) 56 kg (123 lb 7.3 oz) 56 kg (123 lb 7.3 oz)  13. SIRS. Resolved.   Antibiotics completed. Blood cultures no growth. Monitor for any fever  14. Hypertension. Coreg 12.5 mg twice daily, decreased to 6.25 on 12/11, DC'd on 12/14    Vitals:   03/26/17 1401 03/27/17 0532  BP: 120/62 (!) 144/59  Pulse: (!) 102 71  Resp: 16 18  Temp: (!) 97.5 F (36.4 C) 98 F (  36.7 C)  SpO2: 100% 98%     Symptomatic orthostasis, abdominal binder   Encourage fluids    Low dose Midodrin on 12/13 with some improvement   Improving on 12/17 15. New onset atrial fibrillation. Follow-up cardiology services. Coreg 6.25  DC'd on 12/14 16. Hyperlipidemia. Lipitor 17. Constipation. Laxative assistance  LOS (Days) 13 A FACE TO FACE EVALUATION WAS PERFORMED  Tremaine Fuhriman Karis Jubanil Andrew Blasius 03/27/2017 9:37 AM

## 2017-03-27 NOTE — Discharge Instructions (Signed)
Inpatient Rehab Discharge Instructions  [name] KitchenJames D Sitts Discharge date and time: No discharge date for patient encounter.   Activities/Precautions/ Functional Status: Activity: activity as tolerated Diet: renal diet Wound Care: keep wound clean and dry Functional status:  ___ No restrictions     ___ Walk up steps independently ___ 24/7 supervision/assistance   ___ Walk up steps with assistance ___ Intermittent supervision/assistance  ___ Bathe/dress independently ___ Walk with walker     __x_ Bathe/dress with assistance ___ Walk Independently    ___ Shower independently ___ Walk with assistance    ___ Shower with assistance ___ No alcohol     ___ Return to work/school ________  Special Instructions: Continue CCPD followed by Dr.Baveja   My questions have been answered and I understand these instructions. I will adhere to these goals and the provided educational materials after my discharge from the hospital.  Patient/Caregiver Signature _______________________________ Date __________  Clinician Signature _______________________________________ Date __________  Please bring this form and your medication list with you to all your follow-up doctor's appointments.

## 2017-03-27 NOTE — Progress Notes (Signed)
Without complaint of nausea or dizziness. Reports feeling better than Saturday night. Wife at bedside providing hands on care. Alfredo MartinezMurray, Estiben Mizuno A

## 2017-03-27 NOTE — Progress Notes (Addendum)
[facility] Progress Note  Appears to be doing well on peritoneal dialysis  nochanges needed to prescription Dialysis Orders: Center: [place] [facility]on [facility]. 6 exchanges/day one mid-day exchange (occurs around 9am).  2.5 liters of 2.5% per [facility]D 2 liters of 2.5% for last fill and mid day drain. Dr. Denny PeonBaveja.  -baseline weight for him pre amputation was around 190lbs ~86kg  Assessment/Plan: 1. PAD s/p revision L BKA & new R BKA on 11/21 - stable 2. ESRD - on CCPD in [place] x 2 years, orders written- ongoing- repeat labs Tuesday 3. HTN/vol - symptomatic hypotension last week, lowered UF to all 1.5% and gave 1L NS x 2.  BP up and serum Na better 132 12/16 .  Not eating a lot. Cont same UF for now.  Wts currently ~56 kg - though a fair amount of variance with measures 4. Secondary hyperparathyroidism -P 3.5 Cont to hold phoslo, Follow. No VDRA but getting 50 K Drisdol per month- starting 12/20  5. Anemia of CKD- hgb 9.5 , follow trend. Aranesp 200mcg q wk - needs Fe studies - ordered with am labs for Tuesday- last tsat 18% in Oct 6. Severe malnutrition, Alb 1.6, Prostat +Nepro 7. A fib w/ RVR -per primary 8. DMT2 - per primary 9. Hypokalemia - better, K 4.0, cont KDur 30 /day    Sheffield SliderMartha B Bergman, PA-C [facility] Beeper [phone] 03/27/2017,9:44 AM  LOS: 13 days   Subjective:   Doing better with PD and 1.5% bags - appetite fair at best  Objective Vitals:   03/26/17 0516 03/26/17 0920 03/26/17 1401 03/27/17 0532  BP: 126/63 (!) 96/54 120/62 (!) 144/59  Pulse: (!) 105 (!) 114 (!) 102 71  Resp: 18 18 16 18   Temp: 98.8 F (37.1 C) 98.7 F (37.1 C) (!) 97.5 F (36.4 C) 98 F (36.7 C)  TempSrc: Oral Oral Oral Oral  SpO2: 99%  100% 98%  Weight: 56 kg (123 lb 7.3 oz)   56 kg (123 lb 7.3 oz)   Physical Exam wife staying with pt General: NAD breathing easily supine in bed Heart: RR with ^ ectopy Lungs: no rales Abdomen: soft  NT Extremities: left BKA in sock right wrapped - no sig edema Dialysis Access: PD cath   Additional Objective Labs: Basic Metabolic Panel: Recent Labs  Lab 03/24/17 0525 03/25/17 0951 03/26/17 0630  NA 131* 128* 132*  K 3.3* 4.1 4.0  CL 90* 91* 96*  CO2 29 28 26   GLUCOSE 303* 289* 143*  BUN 54* 53* 50*  CREATININE 4.77* 4.50* 4.30*  CALCIUM 8.7* 8.0* 8.3*  PHOS  --  3.5  --    Liver Function Tests: Recent Labs  Lab 03/25/17 0951  ALBUMIN 1.6*   No results for input(s): LIPASE, AMYLASE in the last 168 hours. CBC: Recent Labs  Lab 03/23/17 0648 03/24/17 0525 03/26/17 0630  WBC 9.2 8.2 10.1  NEUTROABS 6.3  --   --   HGB 10.3* 10.2* 9.5*  HCT 32.5* 32.9* 30.8*  MCV 94.5 95.1 94.8  PLT 497* 486* 422*   Blood Culture    Component Value Date/Time   SDES URINE, CLEAN CATCH 03/13/2017 0658   SPECREQUEST NONE 03/13/2017 0658   CULT NO GROWTH 03/13/2017 0658   REPTSTATUS 03/14/2017 FINAL 03/13/2017 0658    Cardiac Enzymes: No results for input(s): CKTOTAL, CKMB, CKMBINDEX, TROPONINI in the last 168 hours. CBG: Recent Labs  Lab 03/26/17 0705 03/26/17 1140 03/26/17 1636 03/26/17 2033 03/27/17 [id number]  GLUCAP 156* 194* 163* 209* 227*   Iron Studies: No results for input(s): IRON, TIBC, TRANSFERRIN, FERRITIN in the last 72 hours. Lab Results  Component Value Date   INR 1.07 03/01/2017   INR 1.20 01/09/2017   INR 1.22 09/21/2015   Studies/Results: No results found. Medications:  . aspirin EC  81 mg Oral Daily  . atorvastatin  80 mg Oral q1800  . calcium acetate  667 mg Oral TID WC  . chlorhexidine  15 mL Mouth/Throat BID  . clopidogrel  75 mg Oral Daily  . darbepoetin (ARANESP) injection - NON-DIALYSIS  200 mcg Subcutaneous Q Sat-1800  . docusate sodium  100 mg Oral BID  . feeding supplement (PRO-STAT SUGAR FREE 64)  30 mL Oral BID  . gentamicin cream  1 application Topical Daily  . insulin aspart  0-8 Units Subcutaneous TID WC  . insulin NPH Human   46 Units Subcutaneous QAC breakfast  . insulin NPH Human  52 Units Subcutaneous QHS  . midodrine  2.5 mg Oral BID WC  . multivitamin  1 tablet Oral Daily  . potassium chloride  30 mEq Oral Daily  . protein supplement shake  2 oz Oral QID  . [START ON 03/30/2017] Vitamin D (Ergocalciferol)  50,000 Units Oral Q30 days

## 2017-03-27 NOTE — Progress Notes (Signed)
Occupational Therapy Session Note  Patient Details  Name: Joshua [name] MRN: [id number] Date of Birth: [birth year]  Today's Date: 03/27/2017 OT Individual Time: 1015-1100 OT Individual Time Calculation (min): 45 min    Short Term Goals: Week 2:  OT Short Term Goal 1 (Week 2): Continue working on established LTGs set at overall min assist level.   Skilled Therapeutic Interventions/Progress Updates:    Pt received supine in bed, ready for OT tx session. Focus of session on ADL/self-care retraining. Pt completed UB bathing/dressing seated EOB with supervision/setup assist, LB bathing/dressing supine in bed with MinA for advancing pants/underwear over R hip and MinA to maintain sidelying position while advancing clothing items over hips. Pt completes supine<>sitting EOB with Min-ModA and increased time/effort. Pt completes slide board transfer to w/c with ModA. Ended session with Pt semi-reclined in TIS w/c, spouse present, call bell and needs within reach.   Therapy Documentation Precautions:  Precautions Precautions: Fall Restrictions Weight Bearing Restrictions: Yes RLE Weight Bearing: Non weight bearing RLE Partial Weight Bearing Percentage or Pounds: BKA LLE Weight Bearing: Non weight bearing LLE Partial Weight Bearing Percentage or Pounds: BKA   Pain: Pain Assessment Pain Assessment: Faces Pain Score: 2  Faces Pain Scale: Hurts a little bit Pain Type: Acute pain Pain Location: Leg Pain Orientation: Right Pain Descriptors / Indicators: Sore Pain Onset: With Activity Pain Intervention(s): Rest  See Function Navigator for Current Functional Status.   Therapy/Group: Individual Therapy  Orlando PennerBreanna L Tamara Monteith 03/27/2017, 12:35 PM

## 2017-03-28 ENCOUNTER — Other Ambulatory Visit: Payer: Self-pay

## 2017-03-28 ENCOUNTER — Inpatient Hospital Stay (HOSPITAL_COMMUNITY): Payer: Medicare Other

## 2017-03-28 ENCOUNTER — Inpatient Hospital Stay (HOSPITAL_COMMUNITY): Payer: Medicare Other | Admitting: Physical Therapy

## 2017-03-28 ENCOUNTER — Encounter (HOSPITAL_COMMUNITY): Payer: Self-pay | Admitting: General Practice

## 2017-03-28 ENCOUNTER — Inpatient Hospital Stay (HOSPITAL_COMMUNITY)
Admission: AD | Admit: 2017-03-28 | Discharge: 2017-03-31 | DRG: 308 | Disposition: A | Discharge status: Home Health | Payer: Medicare Other | Source: Ambulatory Visit | Attending: Internal Medicine | Admitting: Internal Medicine

## 2017-03-28 ENCOUNTER — Inpatient Hospital Stay (HOSPITAL_COMMUNITY): Payer: Medicare Other | Admitting: Occupational Therapy

## 2017-03-28 DIAGNOSIS — E1149 Type 2 diabetes mellitus with other diabetic neurological complication: Secondary | ICD-10-CM

## 2017-03-28 DIAGNOSIS — G4730 Sleep apnea, unspecified: Secondary | ICD-10-CM | POA: Diagnosis present

## 2017-03-28 DIAGNOSIS — E1122 Type 2 diabetes mellitus with diabetic chronic kidney disease: Secondary | ICD-10-CM | POA: Diagnosis present

## 2017-03-28 DIAGNOSIS — Z794 Long term (current) use of insulin: Secondary | ICD-10-CM

## 2017-03-28 DIAGNOSIS — I5022 Chronic systolic (congestive) heart failure: Secondary | ICD-10-CM | POA: Diagnosis present

## 2017-03-28 DIAGNOSIS — E43 Unspecified severe protein-calorie malnutrition: Secondary | ICD-10-CM | POA: Diagnosis present

## 2017-03-28 DIAGNOSIS — D638 Anemia in other chronic diseases classified elsewhere: Secondary | ICD-10-CM | POA: Diagnosis not present

## 2017-03-28 DIAGNOSIS — I4891 Unspecified atrial fibrillation: Secondary | ICD-10-CM | POA: Diagnosis present

## 2017-03-28 DIAGNOSIS — J449 Chronic obstructive pulmonary disease, unspecified: Secondary | ICD-10-CM | POA: Diagnosis present

## 2017-03-28 DIAGNOSIS — E785 Hyperlipidemia, unspecified: Secondary | ICD-10-CM | POA: Diagnosis present

## 2017-03-28 DIAGNOSIS — Z7902 Long term (current) use of antithrombotics/antiplatelets: Secondary | ICD-10-CM

## 2017-03-28 DIAGNOSIS — I2510 Atherosclerotic heart disease of native coronary artery without angina pectoris: Secondary | ICD-10-CM | POA: Diagnosis present

## 2017-03-28 DIAGNOSIS — I132 Hypertensive heart and chronic kidney disease with heart failure and with stage 5 chronic kidney disease, or end stage renal disease: Secondary | ICD-10-CM | POA: Diagnosis present

## 2017-03-28 DIAGNOSIS — I255 Ischemic cardiomyopathy: Secondary | ICD-10-CM | POA: Diagnosis present

## 2017-03-28 DIAGNOSIS — Z87891 Personal history of nicotine dependence: Secondary | ICD-10-CM

## 2017-03-28 DIAGNOSIS — D62 Acute posthemorrhagic anemia: Secondary | ICD-10-CM | POA: Diagnosis not present

## 2017-03-28 DIAGNOSIS — Z992 Dependence on renal dialysis: Secondary | ICD-10-CM

## 2017-03-28 DIAGNOSIS — Z89511 Acquired absence of right leg below knee: Secondary | ICD-10-CM | POA: Diagnosis not present

## 2017-03-28 DIAGNOSIS — Z951 Presence of aortocoronary bypass graft: Secondary | ICD-10-CM | POA: Diagnosis not present

## 2017-03-28 DIAGNOSIS — Z95810 Presence of automatic (implantable) cardiac defibrillator: Secondary | ICD-10-CM

## 2017-03-28 DIAGNOSIS — Z955 Presence of coronary angioplasty implant and graft: Secondary | ICD-10-CM

## 2017-03-28 DIAGNOSIS — Z89512 Acquired absence of left leg below knee: Secondary | ICD-10-CM | POA: Diagnosis not present

## 2017-03-28 DIAGNOSIS — M3500 Sicca syndrome, unspecified: Secondary | ICD-10-CM | POA: Diagnosis present

## 2017-03-28 DIAGNOSIS — I420 Dilated cardiomyopathy: Secondary | ICD-10-CM | POA: Diagnosis present

## 2017-03-28 DIAGNOSIS — Z89519 Acquired absence of unspecified leg below knee: Secondary | ICD-10-CM

## 2017-03-28 DIAGNOSIS — R6510 Systemic inflammatory response syndrome (SIRS) of non-infectious origin without acute organ dysfunction: Secondary | ICD-10-CM | POA: Diagnosis not present

## 2017-03-28 DIAGNOSIS — E1151 Type 2 diabetes mellitus with diabetic peripheral angiopathy without gangrene: Secondary | ICD-10-CM | POA: Diagnosis present

## 2017-03-28 DIAGNOSIS — I951 Orthostatic hypotension: Secondary | ICD-10-CM | POA: Diagnosis not present

## 2017-03-28 DIAGNOSIS — Z6822 Body mass index (BMI) 22.0-22.9, adult: Secondary | ICD-10-CM

## 2017-03-28 DIAGNOSIS — K648 Other hemorrhoids: Secondary | ICD-10-CM | POA: Diagnosis present

## 2017-03-28 DIAGNOSIS — T85611A Breakdown (mechanical) of intraperitoneal dialysis catheter, initial encounter: Secondary | ICD-10-CM

## 2017-03-28 DIAGNOSIS — I471 Supraventricular tachycardia: Principal | ICD-10-CM | POA: Diagnosis present

## 2017-03-28 DIAGNOSIS — N2581 Secondary hyperparathyroidism of renal origin: Secondary | ICD-10-CM | POA: Diagnosis present

## 2017-03-28 DIAGNOSIS — Z8673 Personal history of transient ischemic attack (TIA), and cerebral infarction without residual deficits: Secondary | ICD-10-CM | POA: Diagnosis not present

## 2017-03-28 DIAGNOSIS — K219 Gastro-esophageal reflux disease without esophagitis: Secondary | ICD-10-CM | POA: Diagnosis present

## 2017-03-28 DIAGNOSIS — R000 Tachycardia, unspecified: Secondary | ICD-10-CM | POA: Diagnosis not present

## 2017-03-28 DIAGNOSIS — N186 End stage renal disease: Secondary | ICD-10-CM | POA: Diagnosis present

## 2017-03-28 DIAGNOSIS — Z4781 Encounter for orthopedic aftercare following surgical amputation: Secondary | ICD-10-CM | POA: Diagnosis not present

## 2017-03-28 DIAGNOSIS — I10 Essential (primary) hypertension: Secondary | ICD-10-CM | POA: Diagnosis not present

## 2017-03-28 DIAGNOSIS — D631 Anemia in chronic kidney disease: Secondary | ICD-10-CM | POA: Diagnosis present

## 2017-03-28 DIAGNOSIS — E876 Hypokalemia: Secondary | ICD-10-CM | POA: Diagnosis not present

## 2017-03-28 DIAGNOSIS — E1142 Type 2 diabetes mellitus with diabetic polyneuropathy: Secondary | ICD-10-CM | POA: Diagnosis not present

## 2017-03-28 DIAGNOSIS — Z79899 Other long term (current) drug therapy: Secondary | ICD-10-CM

## 2017-03-28 DIAGNOSIS — Z7982 Long term (current) use of aspirin: Secondary | ICD-10-CM

## 2017-03-28 HISTORY — DX: Unspecified osteoarthritis, unspecified site: M19.90

## 2017-03-28 LAB — FERRITIN: Ferritin: 2169 ng/mL — ABNORMAL HIGH (ref 24–336)

## 2017-03-28 LAB — CBC
HCT: 32.8 % — ABNORMAL LOW (ref 39.0–52.0)
HCT: 32 % — ABNORMAL LOW (ref 39.0–52.0)
Hemoglobin: 10.1 g/dL — ABNORMAL LOW (ref 13.0–17.0)
Hemoglobin: 9.7 g/dL — ABNORMAL LOW (ref 13.0–17.0)
MCH: 29.4 pg (ref 26.0–34.0)
MCH: 29.6 pg (ref 26.0–34.0)
MCHC: 30.8 g/dL (ref 30.0–36.0)
MCHC: 30.3 g/dL (ref 30.0–36.0)
MCV: 95.6 fL (ref 78.0–100.0)
MCV: 97.6 fL (ref 78.0–100.0)
Platelets: 451 10*3/uL — ABNORMAL HIGH (ref 150–400)
Platelets: 463 10*3/uL — ABNORMAL HIGH (ref 150–400)
RBC: 3.43 MIL/uL — ABNORMAL LOW (ref 4.22–5.81)
RBC: 3.28 MIL/uL — ABNORMAL LOW (ref 4.22–5.81)
RDW: 16.7 % — ABNORMAL HIGH (ref 11.5–15.5)
RDW: 17.2 % — ABNORMAL HIGH (ref 11.5–15.5)
WBC: 7.6 10*3/uL (ref 4.0–10.5)
WBC: 10.2 10*3/uL (ref 4.0–10.5)

## 2017-03-28 LAB — RENAL FUNCTION PANEL
Albumin: 1.7 g/dL — ABNORMAL LOW (ref 3.5–5.0)
Anion gap: 8 (ref 5–15)
BUN: 50 mg/dL — ABNORMAL HIGH (ref 6–20)
CO2: 27 mmol/L (ref 22–32)
Calcium: 8.5 mg/dL — ABNORMAL LOW (ref 8.9–10.3)
Chloride: 97 mmol/L — ABNORMAL LOW (ref 101–111)
Creatinine, Ser: 4.16 mg/dL — ABNORMAL HIGH (ref 0.61–1.24)
GFR calc Af Amer: 15 mL/min — ABNORMAL LOW (ref >60)
GFR calc non Af Amer: 13 mL/min — ABNORMAL LOW (ref >60)
Glucose, Bld: 191 mg/dL — ABNORMAL HIGH (ref 65–99)
Phosphorus: 3.2 mg/dL (ref 2.5–4.6)
Potassium: 4.3 mmol/L (ref 3.5–5.1)
Sodium: 132 mmol/L — ABNORMAL LOW (ref 135–145)

## 2017-03-28 LAB — IRON AND TIBC
Iron: 47 ug/dL (ref 45–182)
Saturation Ratios: 26 % (ref 17.9–39.5)
TIBC: 183 ug/dL — ABNORMAL LOW (ref 250–450)
UIBC: 136 ug/dL

## 2017-03-28 LAB — CREATININE, SERUM
Creatinine, Ser: 4.3 mg/dL — ABNORMAL HIGH (ref 0.61–1.24)
GFR calc Af Amer: 14 mL/min — ABNORMAL LOW (ref >60)
GFR calc non Af Amer: 12 mL/min — ABNORMAL LOW (ref >60)

## 2017-03-28 LAB — GLUCOSE, CAPILLARY
Glucose-Capillary: 134 mg/dL — ABNORMAL HIGH (ref 65–99)
Glucose-Capillary: 99 mg/dL (ref 65–99)

## 2017-03-28 MED ORDER — INSULIN NPH (HUMAN) (ISOPHANE) 100 UNIT/ML ~~LOC~~ SUSP: 52.0000 [IU] | Freq: Every day | SUBCUTANEOUS | 11 refills | Status: DC

## 2017-03-28 MED ORDER — PRO-STAT SUGAR FREE PO LIQD: 30.0000 mL | Freq: Two times a day (BID) | ORAL | 0 refills | Status: AC

## 2017-03-28 MED ORDER — HEPARIN 1000 UNIT/ML FOR PERITONEAL DIALYSIS
INTRAPERITONEAL | Status: DC | PRN
  Filled 2017-03-28: qty 3000

## 2017-03-28 MED ORDER — HEPARIN SODIUM (PORCINE) 5000 UNIT/ML IJ SOLN
5000.0000 [IU] | Freq: Three times a day (TID) | INTRAMUSCULAR | Status: DC
  Administered 2017-03-28 – 2017-03-31 (×9): 5000 [IU] via SUBCUTANEOUS
  Filled 2017-03-28 (×10): qty 1

## 2017-03-28 MED ORDER — HEPARIN 1000 UNIT/ML FOR PERITONEAL DIALYSIS: 500.0000 [IU] | INTRAMUSCULAR | Status: DC | PRN

## 2017-03-28 MED ORDER — ACETAMINOPHEN 325 MG PO TABS
650.0000 mg | ORAL_TABLET | Freq: Four times a day (QID) | ORAL | Status: DC | PRN
  Administered 2017-03-28 – 2017-03-29 (×2): 650 mg via ORAL
  Filled 2017-03-28 (×2): qty 2

## 2017-03-28 MED ORDER — GENTAMICIN SULFATE 0.1 % EX CREA
1.0000 "application " | TOPICAL_CREAM | Freq: Every day | CUTANEOUS | Status: DC
  Administered 2017-03-28 – 2017-03-30 (×4): 1 via TOPICAL
  Filled 2017-03-28: qty 15

## 2017-03-28 MED ORDER — OXYCODONE HCL 5 MG PO TABS: 5.0000 mg | ORAL_TABLET | Freq: Four times a day (QID) | ORAL | 0 refills | Status: DC | PRN

## 2017-03-28 MED ORDER — INSULIN ASPART 100 UNIT/ML ~~LOC~~ SOLN: 0.0000 [IU] | Freq: Three times a day (TID) | SUBCUTANEOUS | 11 refills | Status: AC

## 2017-03-28 MED ORDER — POTASSIUM CHLORIDE CRYS ER 15 MEQ PO TBCR: 30.0000 meq | EXTENDED_RELEASE_TABLET | Freq: Every day | ORAL | Status: DC

## 2017-03-28 MED ORDER — DARBEPOETIN ALFA 200 MCG/0.4ML IJ SOSY: 200.0000 ug | PREFILLED_SYRINGE | INTRAMUSCULAR | Status: AC

## 2017-03-28 MED ORDER — INSULIN NPH (HUMAN) (ISOPHANE) 100 UNIT/ML ~~LOC~~ SUSP: 46.0000 [IU] | Freq: Every day | SUBCUTANEOUS | 11 refills | Status: DC

## 2017-03-28 MED ORDER — DELFLEX-LC/1.5% DEXTROSE 344 MOSM/L IP SOLN
INTRAPERITONEAL | Status: AC
  Administered 2017-03-28: 19:00:00 via INTRAPERITONEAL

## 2017-03-28 MED ORDER — CARVEDILOL 6.25 MG PO TABS: 6.2500 mg | ORAL_TABLET | Freq: Two times a day (BID) | ORAL | Status: DC

## 2017-03-28 MED ORDER — AMIODARONE HCL 200 MG PO TABS
200.0000 mg | ORAL_TABLET | Freq: Two times a day (BID) | ORAL | Status: DC
  Administered 2017-03-28 – 2017-03-31 (×7): 200 mg via ORAL
  Filled 2017-03-28 (×7): qty 1

## 2017-03-28 MED ORDER — CARVEDILOL 6.25 MG PO TABS
6.2500 mg | ORAL_TABLET | Freq: Two times a day (BID) | ORAL | Status: DC
  Administered 2017-03-28: 6.25 mg via ORAL
  Filled 2017-03-28: qty 1

## 2017-03-28 MED ORDER — MIDODRINE HCL 2.5 MG PO TABS: 2.5000 mg | ORAL_TABLET | Freq: Two times a day (BID) | ORAL | Status: DC

## 2017-03-28 NOTE — Progress Notes (Signed)
Paged  By PMR  On call  For  Pt  With tachycardia   HR in 140's   EKG  12/18  SHOWS  MAT Tachycardia No Afib.  Suggest  Restart  Coreg low dose or  Low  Dose IV metoprolo 2.5 mg  q 12h  Prn if HR>130 BPM , HOLD IF  SBP <100 D/W PMR   On call    Transfer  Telemetry unit  , if  Needed    Reviewed Dr Judd GaudierSkain's notes 12/3 Wandering atrial pacemaker -Extensive data review, telemetry review, EKG reviews all demonstrate wandering atrial pacemaker/multifocal atrial tachycardia with no evidence of atrial fibrillation.  There are clear P waves preceding QRS complexes and during areas of tachycardia the P waves are buried within the T waves. -Defibrillator interrogation only demonstrates irregularity of ventricular beats because there is no atrial lead present therefore this data is not helpful in this diagnosis.  Clearly telemetry and EKG however demonstrate P waves, showing wandering atrial pacemaker. -Since there is no evidence of atrial fibrillation, no anticoagulation is needed  Ischemic cardiomyopathy -EF 35%, fluid controlled by dialysis. -Given his current use of carvedilol 6.25 mg twice a day, I would increase this to 12.5 mg twice a day to help control his rate.  Try to avoid diltiazem in the setting of cardiomyopathy.  I discussed this with primary team.  End-stage renal disease -On peritoneal dialysis. -Repleting potassium

## 2017-03-28 NOTE — Discharge Summary (Signed)
NAMMarland Kitchen:  [name], [name]               ACCOUNT NO.:  [id number]  MEDICAL RECORD NO.:  [id number]  LOCATION:  [id number]                        FACILITY:  [facility]  PHYSICIAN:  Joshua MorrowAnkit Patel, MD        DATE OF BIRTH:  01/03/1941  DATE OF ADMISSION:  03/14/2017 DATE OF DISCHARGE:  03/28/2017                              DISCHARGE SUMMARY   DISCHARGE DIAGNOSES: 1. Right below-knee amputation with revision left below knee     amputation on March 01, 2017. 2. Pain management. 3. Acute on chronic anemia. 4. End-stage renal disease, on peritoneal dialysis. 5. Diastolic congestive heart failure, cardiomyopathy. 6. Orthostasis with tachycardia. 7. Atrial fibrillation. 8. Wandering pacemaker.  This is a 76 year old right-handed male with history of diabetes mellitus, CAD with CABG, end-stage renal disease, peritoneal dialysis, recent left below-knee amputation on December 23, 2016.  Received Inpatient Rehab Services.  Presented on March 01, 2017, with progressive ischemic changes of right foot, gangrenous changes.  On the same day, he underwent a right BKA, revision of left BKA, limited by pain, surgeries, wound VAC later discontinued.  Followed by Orthopedic Services.  Chronic peritoneal dialysis ongoing.  Troponin mildly elevated at 0.17, felt to be demand ischemia, monitored.  Bouts of hypokalemia, orthostasis.  Coreg was adjusted.  Completed course of antibiotics and readmitted for comprehensive rehab program.  PAST MEDICAL HISTORY:  See discharge diagnoses.  SOCIAL HISTORY:  He lives with wife.  FUNCTIONAL STATUS UPON ADMISSION TO REHAB SERVICES:  +2 physical assist anterior and posterior transfers, moderate assist supine to sit, mod to max assist activities of daily living.  PHYSICAL EXAMINATION:  VITAL SIGNS:  Blood pressure 110/70, pulse 100, temperature 98, and respirations 18. GENERAL:  Alert male, in no acute distress. HEENT:  EOMs intact. NECK:  Supple and nontender.  No  JVD. CARDIAC:  Rate controlled. ABDOMEN:  Soft and nontender.  Good bowel sounds. LUNGS:  Clear to auscultation without wheeze. EXTREMITIES:  Right BKA site was dusky with surgical changes.  Left BKA site healing.  REHABILITATION HOSPITAL COURSE:  The patient was admitted to Inpatient Rehab Services.  Therapies initiated on a 3-hour daily basis, consisting of physical therapy, occupational therapy, and rehabilitation nursing. The following issues were addressed during the patient's rehabilitation stay.  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.  Close monitoring of wound care.  Pain management with use of Flexeril and oxycodone.  Peritoneal dialysis is ongoing as per Renal Services.  Blood sugars monitored with variables adjustments made in insulin therapy, CAD with CABG, wandering pacemaker, bouts of atrial fibrillation, orthostasis.  Coreg initially decreased to 6.25 mg b.i.d., discontinued on December 14 due to orthostasis, later resumed on March 28, 2017, after noted tachycardia, orthostasis.  EKG, initially atrial fibrillation, cleared by Cardiology Services.  After further review, it was felt by Cardiology Services that the patient needed to be on a telemetry monitor with planned discharge to Acute Care Services.  All medication changes made as per [facility] as well as Cardiology Services.  CONDITION:  Guarded at the time of discharge.     [name], P.A.   ______________________________ Joshua MorrowAnkit Patel,  MD    DA/MEDQ  D:  03/28/2017  T:  03/28/2017  Job:  [id number]  cc:   Nadara Mustard, MD Joshua Morrow, MD

## 2017-03-28 NOTE — Progress Notes (Signed)
Late entry . Patient transferred to 3E15. Patient HR 106 no complaints of shortness of breath. Patient reports he  starts to have increase pain with mobility. Continue with plan of care.  Joshua NeerJoyce, Akhilesh Sassone S

## 2017-03-28 NOTE — H&P (Addendum)
History and Physical  [name] KitchenJames D Sisneros WUJ:[id number] DOB: [birth year] DOA: 03/28/2017  Referring physician: Rehab Physician, Dr. Allena KatzPatel PCP: Levell Julyhivianathan, Birdie HopesSusan S, MD  Outpatient Specialists: Cardiology   Patient coming from: Rehab facility  Chief Complaint: Atrial tachycardia  HPI: [name] with complicated medical history. Patient has ESRD on PD, S/P bilateral BKA, ischemic cardiomyopathy, DM, PVD and S/P AICD placement. Patient had right BKA some time in November of this year and was transferred to rehab facility but has been unstable for rehab. Patient has had significant tachycardia with low to low normal blood pressure. Patient was seen by the Cardiologist according to rehab PA and it was advised for the patient to be transferred to a telemetry unit. Patient has no new complaints, no chest pain or SOB today, but experienced both yesterday. No fever or chills, no GI symptoms.  ED Course: Transferred from rehab  Pertinent labs:  EKG: Independently reviewed.  Imaging: independently reviewed.   Review of Systems:  10 points were reviewed and nil significant. Negative for fever, visual changes, sore throat, rash, new muscle aches, chest pain, SOB, dysuria, bleeding, n/v/abdominal pain.  Past Medical History:  Diagnosis Date  . AICD (automatic cardioverter/defibrillator) present    [employer] [phone] lead [id number]  . CHF (congestive heart failure) (HCC)   . Complication of anesthesia   . COPD (chronic obstructive pulmonary disease) (HCC)   . Coronary artery disease    CABG 2011  . Diabetes mellitus without complication (HCC)   . ESRD on peritoneal dialysis [facility](HCC)    [place] in [place].   . Gangrene (HCC)    left knee  . GERD (gastroesophageal reflux disease)   . History of peritonitis    PD cath related peritonitis in April 2017  . Hypertension   . Peripheral vascular disease (HCC)   . PONV  (postoperative nausea and vomiting)   . Sjogren's syndrome (HCC)    Per pt's wife, was diagnosed in 2016 during w/u for cause of renal failure prior to starting dialysis.  He had a rash on his chest apparently prompting this w/u.  Never had renal bx.  He [place] and per the wife he was treated with MTX and other medications but had side effects to "all of it" and isn't taking anything for this as of Jun 2017.   [name]. Sleep apnea    wears Bipap  . Stroke (HCC)   . Wound dehiscence    Right foot    Past Surgical History:  Procedure Laterality Date  . ABDOMINAL AORTOGRAM W/LOWER EXTREMITY N/A 11/29/2016   Procedure: ABDOMINAL AORTOGRAM W/LOWER EXTREMITY;  Surgeon: Nada LibmanBrabham, Vance W, MD;  [facility];  Service: Cardiovascular;  Laterality: N/A;  . AMPUTATION Left 11/29/2016   Procedure: LEFT THIRD TOE AMPUTATION;  Surgeon: Nada LibmanBrabham, Vance W, MD;  [facility];  Service: Vascular;  Laterality: Left;  . AMPUTATION Left 12/23/2016   Procedure: Left Transmetatarsal Amputation;  Surgeon: [name], Marcus V, MD;  [facility];  Service: Orthopedics;  Laterality: Left;  . AMPUTATION Left 01/11/2017   Procedure: LEFT BELOW KNEE AMPUTATION;  Surgeon: [name], Marcus V, MD;  [facility];  Service: Orthopedics;  Laterality: Left;  . AMPUTATION Right 01/20/2017   Procedure: AMPUTATION RIGHT FIFTH TOE;  Surgeon: Nada LibmanBrabham, Vance W, MD;  Location: [facility] OR;  Service: Vascular;  Laterality: Right;  . AMPUTATION Right 03/01/2017  Procedure: RIGHT BELOW KNEE AMPUTATION;  Surgeon: [name], Marcus V, MD;  [facility];  Service: Orthopedics;  Laterality: Right;  . ANGIOPLASTY  12/15/2016   Procedure: BALLOON ANGIOPLASTY;  Surgeon: Maeola Harmanain, Brandon Christopher, MD;  [facility];  Service: Vascular;;  . cardiac catherization with stent placement  2017  . COLONOSCOPY W/ BIOPSIES AND POLYPECTOMY    . CORONARY ARTERY BYPASS GRAFT  2011  . FALSE ANEURYSM REPAIR Right 12/15/2016    Procedure: REPAIR FALSE ANEURYSM;  Surgeon: Maeola Harmanain, Brandon Christopher, MD;  [facility];  Service: Vascular;  Laterality: Right;  . IRRIGATION AND DEBRIDEMENT FOOT Left 12/15/2016   Procedure: IRRIGATION AND DEBRIDEMENT FOOT;  Surgeon: Maeola Harmanain, Brandon Christopher, MD;  [facility];  Service: Vascular;  Laterality: Left;  . LEFT HEART CATH AND CORS/GRAFTS ANGIOGRAPHY N/A 01/09/2017   Procedure: LEFT HEART CATH AND CORS/GRAFTS ANGIOGRAPHY;  Surgeon: Runell GessBerry, Jonathan J, MD;  [facility];  Service: Cardiovascular;  Laterality: N/A;  . LOWER EXTREMITY ANGIOGRAM Left 12/15/2016   Procedure: LOWER EXTREMITY ANGIOGRAM; PEDAL ACCESS;  Surgeon: Maeola Harmanain, Brandon Christopher, MD;  [facility];  Service: Vascular;  Laterality: Left;  . PERIPHERAL VASCULAR BALLOON ANGIOPLASTY  11/29/2016   Procedure: PERIPHERAL VASCULAR BALLOON ANGIOPLASTY;  Surgeon: Nada LibmanBrabham, Vance W, MD;  [facility];  Service: Cardiovascular;;  LT Peroneal AT attempted unsuccessful  . STUMP REVISION Left 03/01/2017   Procedure: REVISION LEFT BELOW KNEE AMPUTATION;  Surgeon: [name], Marcus V, MD;  [facility];  Service: Orthopedics;  Laterality: Left;     reports that he has quit smoking. His smoking use included pipe. he has never used smokeless tobacco. He reports that he does not drink alcohol or use drugs.  Allergies  Allergen Reactions  . Sulfa Antibiotics Hives  . Vancomycin Other (See Comments)    Blisters   . Zanaflex [Tizanidine Hcl] Other (See Comments)    Hypotensive stroke  . Gabapentin (Once-Daily) Hypertension  . Lisinopril Hives  . Methotrexate Derivatives Other (See Comments)    blisters  . Tape Hives  . Flexeril [Cyclobenzaprine] Other (See Comments)    somnolence    Family History  Problem Relation Age of Onset  . Heart disease Father   . Diabetes Mother      Prior to Admission medications   Medication Sig Start Date End Date Taking? Authorizing Provider  Amino  Acids-Protein Hydrolys (FEEDING SUPPLEMENT, PRO-STAT SUGAR FREE 64,) LIQD Take 30 mLs by mouth 2 (two) times daily. 03/28/17   Angiulli, Mcarthur Rossettianiel J, PA-C  aspirin EC 81 MG tablet Take 81 mg by mouth daily.    [provider]  atorvastatin (LIPITOR) 80 MG tablet Take 1 tablet (80 mg total) by mouth daily at 6 PM. 02/07/17   Angiulli, Mcarthur Rossettianiel J, PA-C  calcium acetate (PHOSLO) 667 MG capsule Take 1 capsule (667 mg total) by mouth 3 (three) times daily with meals. 02/07/17   Angiulli, Mcarthur Rossettianiel J, PA-C  carvedilol (COREG) 6.25 MG tablet Take 1 tablet (6.25 mg total) by mouth 2 (two) times daily with a meal. 03/28/17   Angiulli, Mcarthur Rossettianiel J, PA-C  clopidogrel (PLAVIX) 75 MG tablet Take 1 tablet (75 mg total) by mouth daily. 02/07/17   Angiulli, Mcarthur Rossettianiel J, PA-C  Darbepoetin Alfa (ARANESP) 200 MCG/0.4ML SOSY injection Inject 0.4 mLs (200 mcg total) into the skin every Saturday at 6 PM. 04/01/17   Angiulli, Mcarthur Rossettianiel J, PA-C  docusate sodium (COLACE) 100 MG capsule Take 100 mg by mouth 2 (two) times  daily.     [provider]  insulin aspart (NOVOLOG) 100 UNIT/ML injection Inject 0-8 Units into the skin 3 (three) times daily with meals. 03/28/17   Angiulli, Mcarthur Rossetti, PA-C  insulin NPH Human (HUMULIN N,NOVOLIN N) 100 UNIT/ML injection Inject 0.46 mLs (46 Units total) into the skin daily before breakfast. 03/29/17   Angiulli, Mcarthur Rossetti, PA-C  insulin NPH Human (HUMULIN N,NOVOLIN N) 100 UNIT/ML injection Inject 0.52 mLs (52 Units total) into the skin at bedtime. 03/28/17   Angiulli, Mcarthur Rossetti, PA-C  midodrine (PROAMATINE) 2.5 MG tablet Take 1 tablet (2.5 mg total) by mouth 2 (two) times daily with breakfast and lunch. 03/28/17   Angiulli, Mcarthur Rossetti, PA-C  multivitamin (RENA-VIT) TABS tablet Take 1 tablet by mouth at bedtime. 01/17/17   Narda Bonds, MD  nitroGLYCERIN (NITROSTAT) 0.4 MG SL tablet Place 0.4 mg under the tongue every 5 (five) minutes as needed for chest pain.    [provider]    oxyCODONE (OXY IR/ROXICODONE) 5 MG immediate release tablet Take 1 tablet (5 mg total) by mouth every 6 (six) hours as needed for severe pain. 03/28/17   Angiulli, Mcarthur Rossetti, PA-C  polyethylene glycol (MIRALAX / GLYCOLAX) packet Take 17 g by mouth daily. 01/18/17   Narda Bonds, MD  potassium chloride (KLOR-CON M15) 15 MEQ tablet Take 2 tablets (30 mEq total) by mouth daily. 03/29/17   Angiulli, Mcarthur Rossetti, PA-C  Vitamin D, Ergocalciferol, (DRISDOL) 50000 units CAPS capsule Take 50,000 Units by mouth every 30 (thirty) days.     [provider]    Physical Exam: Vitals:   03/28/17 1412  BP: 124/62  Pulse: 97  Resp: 18  Temp: 98.5 F (36.9 C)  TempSrc: Oral  SpO2: 100%     Constitutional:  . Appears calm and comfortable Eyes:  . Pallor. No jaundice.  ENMT:  . external ears, nose appear normal Neck:  . Neck is supple. No JVD Respiratory:  . CTA   . Respiratory effort normal. No retractions or accessory muscle use Cardiovascular:  . S1S2, tachycardia, regular . Bilateral BKA Abdomen:  . Abdomen is obese, soft and non tender. Organs are difficult to assess. Neurologic:  . Awake and alert. . Moves all limbs.  Wt Readings from Last 3 Encounters:  03/27/17 58 kg (127 lb 13.9 oz)  03/14/17 75 kg (165 lb 5.5 oz)  03/12/17 78 kg (171 lb 15.3 oz)    I have personally reviewed following labs and imaging studies  Labs on Admission:  CBC: Recent Labs  Lab 03/23/17 0648 03/24/17 0525 03/26/17 0630 03/28/17 0443  WBC 9.2 8.2 10.1 7.6  NEUTROABS 6.3  --   --   --   HGB 10.3* 10.2* 9.5* 10.1*  HCT 32.5* 32.9* 30.8* 32.8*  MCV 94.5 95.1 94.8 95.6  PLT 497* 486* 422* 451*   Basic Metabolic Panel: Recent Labs  Lab 03/24/17 0525 03/25/17 0951 03/26/17 0630 03/28/17 0443  NA 131* 128* 132* 132*  K 3.3* 4.1 4.0 4.3  CL 90* 91* 96* 97*  CO2 29 28 26 27   GLUCOSE 303* 289* 143* 191*  BUN 54* 53* 50* 50*  CREATININE 4.77* 4.50* 4.30* 4.16*  CALCIUM 8.7* 8.0*  8.3* 8.5*  PHOS  --  3.5  --  3.2   Liver Function Tests: Recent Labs  Lab 03/25/17 0951 03/28/17 0443  ALBUMIN 1.6* 1.7*   No results for input(s): LIPASE, AMYLASE in the last 168 hours. No results for input(s): AMMONIA  in the last 168 hours. Coagulation Profile: No results for input(s): INR, PROTIME in the last 168 hours. Cardiac Enzymes: No results for input(s): CKTOTAL, CKMB, CKMBINDEX, TROPONINI in the last 168 hours. BNP (last 3 results) No results for input(s): PROBNP in the last 8760 hours. HbA1C: No results for input(s): HGBA1C in the last 72 hours. CBG: Recent Labs  Lab 03/27/17 1158 03/27/17 1626 03/27/17 2113 03/28/17 0630 03/28/17 1139  GLUCAP 159* 155* 172* 134* 99   Lipid Profile: No results for input(s): CHOL, HDL, LDLCALC, TRIG, CHOLHDL, LDLDIRECT in the last 72 hours. Thyroid Function Tests: No results for input(s): TSH, T4TOTAL, FREET4, T3FREE, THYROIDAB in the last 72 hours. Anemia Panel: Recent Labs    03/28/17 0443  FERRITIN 2,169*  TIBC 183*  IRON 47   Urine analysis:    Component Value Date/Time   COLORURINE YELLOW 03/13/2017 1251   APPEARANCEUR CLOUDY (A) 03/13/2017 1251   LABSPEC 1.016 03/13/2017 1251   PHURINE 6.0 03/13/2017 1251   GLUCOSEU >=500 (A) 03/13/2017 1251   HGBUR MODERATE (A) 03/13/2017 1251   BILIRUBINUR NEGATIVE 03/13/2017 1251   KETONESUR NEGATIVE 03/13/2017 1251   PROTEINUR 100 (A) 03/13/2017 1251   NITRITE NEGATIVE 03/13/2017 1251   LEUKOCYTESUR LARGE (A) 03/13/2017 1251   Sepsis Labs: @LABRCNTIP (procalcitonin:4,lacticidven:4) )No results found for this or any previous visit (from the past 240 hour(s)).    Radiological Exams on Admission: No results found.  EKG: Independently reviewed.  Active Problems:   Atrial tachycardia (HCC)   DCM (dilated cardiomyopathy) (HCC)   Assessment/Plan 1. MAT 2. S/P bilateral BKA 3. DM 4. HTN 5. PVD 6. ESRD on PD ([place]) 7. Ischemic cardiomyopathy 8. Low to low  normal BP   Transfer to telemetry floor  Tachycardia control as per cardiology  Continue HD  Optimize DM and HTN  Cardiology consult  Nephrology consult  Further management will depend on hospital course.  DVT prophylaxis:Freer Heparin Code Status: Full Family Communication: Wife Disposition Plan: To be determined   Consults called: Cardiology and nephrology   Admission status: inpatient    Time spent: 50 minutes  Berton Mount, MD  [facility] Pager #: [phone] 7PM-7AM contact night coverage as above   03/28/2017, 2:59 PM

## 2017-03-28 NOTE — Progress Notes (Signed)
Patient reports feeling no chest pain at this time. EKG completed. Awaiting Transfer orders . D. Anguilli, PA notified of EKG. Continue with plan of care. [name], [name] S

## 2017-03-28 NOTE — Progress Notes (Signed)
[facility] at 23[facility]50  Stating Mr. Sternberg was tachycardic ( apical pulse was 170), patient denies SOB or chest pain, vitals per flow sheet. Coreg was discontinued on 03/24/2017 due to hypotension and dizziness. Stat EKG ordered  and rapid response nurse was called. Notes were reviewed, Dr. Anne FuSkains note was reviewed from 03/13/2017.  I spoke to [place] rapid response nurse, EKG shows Atrial Fibrillation ranging 130-170. Placed a call to Cardiology, spoke with Dr. Townsend RogerVedra, we will transfer Mr. Fox to telemetry, [facility] called, awaiting a return call, at this time.  Spoke with Dr. Katrinka BlazingSmith, inform of the above, they will admit and transfer to telemetry. Placed a call to [facility] she is aware of the above.

## 2017-03-28 NOTE — Progress Notes (Signed)
Physical Therapy Session Note  Patient Details  Name: [name] KitchenJames D Recinos MRN: [id number] Date of Birth: [birth year]  Today's Date: 03/28/2017 PT Missed Time: 75 Minutes Missed Time Reason: MD hold (Comment)(pt transferring back to acute today)  Skilled Therapeutic Interventions/Progress Updates:   Pt on medical hold 2/2 cardiac issues, pt being transferred off unit today. Missed 60 minutes of PT.   Lasasha Brophy K Arnette 03/28/2017, 12:23 PM

## 2017-03-28 NOTE — Progress Notes (Signed)
Patient reports decrease in Chest discomfort. B/P 110/62 HR between 114- 124. Patient at times may have HR in the 160's. Continue awaiting cardiology to confirm transfer to acute for telemetry. Continue with plan of care.  [name], Lorin Hauck S

## 2017-03-28 NOTE — Progress Notes (Addendum)
Progress Note  Patient Name: Joshua KitchenJames D Yontz Date of Encounter: 03/28/2017  Primary Cardiologist: Dr. Etheleen NicksSkains/[facility] Dr. Wilford GristKoontz   Subjective   Reconsulted last night due to increased HR Into the 160's - cardiology saw last night and felt was MAT.  This am still has elevated HR in the 130-160's but asymptomatic with it.  EKG shows NSR with atrial tachycardia and MAT and no afib.  P wave are clearly noted.  Vital Signs    Vitals:   03/28/17 1412  BP: 124/62  Pulse: 97  Resp: 18  Temp: 98.5 F (36.9 C)  TempSrc: Oral  SpO2: 100%   No intake or output data in the 24 hours ending 03/28/17 1440 There were no vitals filed for this visit.  Telemetry    Multifocal atrial tachycardia  - Personally Reviewed  ECG    NSR with atrial tachycardia and MAT - Personally Reviewed  Physical Exam   GEN: No acute distress.   Neck: No JVD Cardiac: RRR and tachycardic, no murmurs, rubs, or gallops.  Respiratory: Clear to auscultation bilaterally. GI: Soft, nontender, non-distended  MS: No edema; No deformity. Neuro:  Nonfocal  Psych: Normal affect   Labs    Chemistry Recent Labs  Lab 03/25/17 0951 03/26/17 0630 03/28/17 0443  NA 128* 132* 132*  K 4.1 4.0 4.3  CL 91* 96* 97*  CO2 28 26 27   GLUCOSE 289* 143* 191*  BUN 53* 50* 50*  CREATININE 4.50* 4.30* 4.16*  CALCIUM 8.0* 8.3* 8.5*  ALBUMIN 1.6*  --  1.7*  GFRNONAA 12* 12* 13*  GFRAA 13* 14* 15*  ANIONGAP 9 10 8      Hematology Recent Labs  Lab 03/24/17 0525 03/26/17 0630 03/28/17 0443  WBC 8.2 10.1 7.6  RBC 3.46* 3.25* 3.43*  HGB 10.2* 9.5* 10.1*  HCT 32.9* 30.8* 32.8*  MCV 95.1 94.8 95.6  MCH 29.5 29.2 29.4  MCHC 31.0 30.8 30.8  RDW 16.5* 16.7* 16.7*  PLT 486* 422* 451*    Cardiac EnzymesNo results for input(s): TROPONINI in the last 168 hours. No results for input(s): TROPIPOC in the last 168 hours.   BNPNo results for input(s): BNP, PROBNP in the last 168 hours.   DDimer No results for  input(s): DDIMER in the last 168 hours.   Radiology    No results found.  Cardiac Studies   Study Conclusions  - Left ventricle: The cavity size was normal. There was moderate   concentric hypertrophy. Systolic function was severely reduced.   The estimated ejection fraction was in the range of 25% to 30%.   Hypokinesis of the anteroseptal, inferoseptal, apical inferior   and apical myocardium. Features are consistent with a   pseudonormal left ventricular filling pattern, with concomitant   abnormal relaxation and increased filling pressure (grade 2   diastolic dysfunction). Doppler parameters are consistent with   high ventricular filling pressure. - Aortic valve: Valve mobility was restricted. Transvalvular   velocity was within the normal range. There was no stenosis.   There was trivial regurgitation. - Mitral valve: Transvalvular velocity was within the normal range.   There was no evidence for stenosis. There was trivial   regurgitation. - Left atrium: The atrium was severely dilated. - Right ventricle: The cavity size was normal. Wall thickness was   normal. Systolic function was normal. - Tricuspid valve: There was trivial regurgitation. - Pulmonary arteries: Systolic pressure was within the normal   range. PA peak pressure: 32 mm Hg (S).  Patient Profile     76 y.o. male  with complicated past medical history including coronary artery disease, prior stent to RCA in 07/2016 in the setting of non-STEMI, ischemic cardiomyopathy status post [employer] placement in August 2018 with hypertension hyperlipidemia end-stage renal disease on peritoneal dialysis and severe peripheral arterial disease with amputations who we were asked to see for clarification of possible diagnosis of either wandering atrial pacemaker or atrial fibrillation.    Assessment & Plan    Wandering atrial pacemaker -initial consult by Dr. Anne FuSkains on 03/13/2017 showed extensive data review,  telemetry review, EKG reviews all demonstrating wandering atrial pacemaker/multifocal atrial tachycardia with no evidence of atrial fibrillation.  There were clear P waves preceding QRS complexes and during areas of tachycardia the P waves are buried within the T waves. -Defibrillator interrogation only demonstrated irregularity of ventricular beats because there is no atrial lead present therefore this data is not helpful in this diagnosis.  Clearly telemetry and EKG however demonstrate P waves, showing wandering atrial pacemaker. -Since there is no evidence of atrial fibrillation, no anticoagulation was started --now reconsulted due to increased heart rate last night and this am as high as 160's.  EKGs all reveal sinus rhythm with runs of atrial tachycardia and MAT.  After receiving am dose of BB his HR is 106bpm in NSR.   - have not been able to increase carvedilol further than 6.25mg  BID due to problems with hypotension on HD.  --Will start Amio 200mg  BID to see if we can suppress atrial tachycardia.   -- ALT was mildly elevated last time it was checked so will monitor LFTs closely while loading Amio  Ischemic cardiomyopathy -EF 35%, fluid controlled by dialysis. -he is currently on carvedilol 6.25mg  BID.  Symptomatic hypotension limits further titration of BP meds.   End-stage renal disease -On peritoneal dialysis. -Repleting potassium  ASCAD - cath 01/2017 with  patent proximal RCA stent, a patent LIMA to the LAD. He has a patent sequential graft to 2 obtuse marginal branches. The first anastomosis has about a 60- 75% stenosis at the anastomosis.. The vein graft crossing that obtuse marginal branch has about 50-60% stenosis. There are no critical stenoses or copper lesions identified. Continue medical therapy recommended.  - continue statin, ASA, Plavix and BB  I have spent a total of 40 minutes with patient reviewing prior hospital notes , telemetry, EKGs, labs and examining patient as  well as establishing an assessment and plan that was discussed with the patient.  > 50% of time was spent in direct patient care.    For questions or updates, please contact [facility] Please consult [email or web address] for contact info under Cardiology/STEMI.      Signed, Armanda Magicraci Shyra Emile, MD  03/28/2017, 2:40 PM

## 2017-03-28 NOTE — Progress Notes (Signed)
Patient HR 165 PA Dan Angiulli notified orders to restart coreg 6.25 BID given

## 2017-03-28 NOTE — Progress Notes (Signed)
PD tx initiated via tenckhoff w/o problem, VSS, report given to Izell CarolinaKennitrish Bracey, RN

## 2017-03-28 NOTE — Progress Notes (Signed)
Social Work  Discharge Note  The overall goal for the admission was met for:   Discharge location: No-TRANSFERRING TO ACUTE UNIT  Length of Stay: No-14 DAYS  Discharge activity level: No-MIN-MOD WHEELCHAIR LEVEL  Home/community participation: Yes  Services provided included: MD, RD, PT, OT, RN, CM, TR, Pharmacy, Neuropsych and SW  Financial Services: Medicare and Private Insurance: [place]  Follow-up services arranged: Other: TRANSFERRING TO TELEMETRY UNIT  Comments (or additional information):NEEDING TELEMETRY UNIT  Patient/Family verbalized understanding of follow-up arrangements: Yes  Individual responsible for coordination of the follow-up plan: [name]-WIFE  Confirmed correct DME delivered: Elease Hashimoto 03/28/2017    Elease Hashimoto

## 2017-03-28 NOTE — Significant Event (Signed)
Rapid Response Event Note  Call Time 2355  Called by Rehab RN to assist with patient HR in the 170s, per RN, patient had a brief episode where is HR was in the 170 accompanied with brief episode of shortness of breath.  Per RN, HR is now in the low 100s on a VS machine and apical pulse correlates with current HR.  When I arrived, patient was resting comfortably in the bed, home CPAP was on. Heart and lung sounds were normal, patient denied chest pain/SOB but stated that "his chest felt weird earlier when his HR was in the 170s briefly".  Current BPs 130s/70s, skin warm and dry, patient is currently getting his nightly PD treatment. Sats 100 % and RR 14-18. Patient is not is distress VSS.   Interventions: -- EKG done -- AF RVR (while hooked up to the EKG, HR speed up twice, once into the 140s and once into the 170s for a few beats then HR was in the low 100s). Patient does have a history of AF this admission, and was on Coreg 6.25 BID but was stopped d/t patient being dizzy and orthostatic.  --  Rehab NP called, we spoke, contact info given for Cards Fellow. Rehab NP was also given [facility]. -- I was called away to a medical emergency, RN was to follow up with Rehab NP about whether patient needs to be transferred to telemetry or not.  Plan of Care (if not transferred): -- I spoke with Rehab nurse at 330 over the phone for the plan and again at 530 in person, per RN, plan is to keep patient on 44M with a continuous pulse.   Event Summary:  End Time 100  Karliah Kowalchuk R

## 2017-03-28 NOTE — Progress Notes (Signed)
Patient with 12 beat run of SVT at 1637 patient was asymptomatic and stable, MD notified [place], pt again with 21 beat run of SVT MD is aware, patient checked and asymptomatic and stable, no chest pain or shortness of breath, vital signs are stable [name], Joshua Quebedeaux N RN 6:57 PM 03-28-2017

## 2017-03-28 NOTE — Progress Notes (Signed)
[facility] PHYSICAL MEDICINE & REHABILITATION     PROGRESS NOTE  Subjective/Complaints:  Patient seen lying in bed this morning. He states he did not sleep well overnight. He states that he had elevated heart rate with shortness of breath overnight. Notes reviewed cardiology called due to wandering pacemaker/atrial tachycardia.  ROS: Denies nausea, vomiting, diarrhea, shortness of breath or chest pain   Objective: Vital Signs: Blood pressure 110/60, pulse (!) 160, temperature 98 F (36.7 C), temperature source Oral, resp. rate 18, weight 58 kg (127 lb 13.9 oz), SpO2 98 %. No results found. Recent Labs    03/26/17 0630 03/28/17 0443  WBC 10.1 7.6  HGB 9.5* 10.1*  HCT 30.8* 32.8*  PLT 422* 451*   Recent Labs    03/26/17 0630 03/28/17 0443  NA 132* 132*  K 4.0 4.3  CL 96* 97*  GLUCOSE 143* 191*  BUN 50* 50*  CREATININE 4.30* 4.16*  CALCIUM 8.3* 8.5*   CBG (last 3)  Recent Labs    03/27/17 1626 03/27/17 2113 03/28/17 0630  GLUCAP 155* 172* 134*    Wt Readings from Last 3 Encounters:  03/27/17 58 kg (127 lb 13.9 oz)  03/14/17 75 kg (165 lb 5.5 oz)  03/12/17 78 kg (171 lb 15.3 oz)    Physical Exam:  BP 110/60   Pulse (!) 160   Temp 98 F (36.7 C) (Oral)   Resp 18   Wt 58 kg (127 lb 13.9 oz)   SpO2 98%   BMI 18.88 kg/m  Constitutional: He appears well-developed and well-nourished.  HENT:  Head: Normocephalic. Healing ulcer on nasal bridge.  Eyes: EOM are normal. No discharge.  Cardiovascular: + Tachycardia. Regular rhythm. No JVD. Respiratory: CTA Bilaterally. Normal effort.   GI: Bowel sounds are normal. He exhibits no distension.   Musculoskeletal: No edema. No tenderness. Neurological: He is alert and oriented.  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 Functional deficits secondary to  bilateral BKA which require 3+ hours per day of interdisciplinary therapy in a comprehensive inpatient rehab setting. Physiatrist is providing close team supervision and 24 hour management of active medical problems listed below. Physiatrist and rehab team continue to assess barriers to discharge/monitor patient progress toward functional and medical goals.  Function:  Bathing Bathing position   Position: Sitting EOB  Bathing parts Body parts bathed by patient: Right arm, Left arm, Chest, Abdomen, Front perineal area, Buttocks, Right upper leg, Left upper leg, Left lower leg Body parts bathed by helper: Back  Bathing assist Assist Level: Supervision or verbal cues      Upper Body Dressing/Undressing Upper body dressing   What is the patient wearing?: Pull over shirt/dress     Pull over shirt/dress - Perfomed by patient: Thread/unthread right sleeve, Thread/unthread left sleeve, Put head through opening, Pull shirt over trunk Pull over shirt/dress - Perfomed by helper: Put head through opening, Pull shirt over trunk        Upper body assist Assist Level: Supervision or verbal cues      Lower Body Dressing/Undressing Lower body dressing   What is the patient wearing?: Pants, Underwear Underwear - Performed by patient: Thread/unthread right underwear leg, Thread/unthread left underwear leg, Pull underwear up/down Underwear - Performed by helper: Pull underwear up/down(over R hip) Pants- Performed by patient: Thread/unthread right pants leg, Thread/unthread left pants  leg, Pull pants up/down Pants- Performed by helper: Pull pants up/down(over R hip)                      Lower body assist Assist for lower body dressing: Touching or steadying assistance (Pt > 75%)      Toileting Toileting Toileting activity did not occur: Safety/medical concerns Toileting steps completed by patient: Adjust clothing prior to toileting Toileting steps completed by helper: Adjust clothing  prior to toileting, Performs perineal hygiene, Adjust clothing after toileting Toileting Assistive Devices: Toilet aid  Toileting assist Assist level: Two helpers   Transfers Chair/bed transfer   Chair/bed transfer method: Lateral scoot, Anterior/posterior Chair/bed transfer assist level: Touching or steadying assistance (Pt > 75%) Chair/bed transfer assistive device: Sliding board     Locomotion Ambulation Ambulation activity did not occur: N/A         Wheelchair   Type: Motorized Max wheelchair distance: 150' Assist Level: Supervision or verbal cues  Cognition Comprehension Comprehension assist level: Understands basic 90% of the time/cues < 10% of the time  Expression Expression assist level: Expresses basic 90% of the time/requires cueing < 10% of the time.  Social Interaction Social Interaction assist level: Interacts appropriately with others with medication or extra time (anti-anxiety, antidepressant).  Problem Solving Problem solving assist level: Solves basic 90% of the time/requires cueing < 10% of the time  Memory Memory assist level: More than reasonable amount of time    Medical Problem List and Plan: 1. 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 Bilateral wound vacs discontinued 03/05/2017 per [name]   Continue CIR as tolerated today   Right BKA evaluated by Ortho, appreciate recs, continue current management.   2. DVT Prophylaxis/Anticoagulation: Heparin per nephrology 3. Pain Management: Flexeril and Oxycodone as needed   Added K pad and muscle rub for back and neck pain 4. Mood: Provide emotional support 5. Neuropsych: This patient is capable of making decisions on his own behalf. 6. Skin/Wound Care: Routine skin checks 7. Fluids/Electrolytes/Nutrition: Routine I&O's 8. Acute on chronic anemia. Continue Aranesp   Hemoglobin 10.1 on 12/18   Cont to monitor 9. End-stage renal disease/CPPD. Follow-up renal  services   PD being adjusted by nephro, appreciate recs 10. Diabetes mellitus peripheral neuropathy. Latest hemoglobin A1c 8.8.   Patient brittle diabetic with significant changes with mild adjustments to insulin   Check blood sugars before meals and at bedtime.    NPH insulin 65 units daily at breakfast, decreased to 55 on 12/7, decreased to 45 on 12/11, increased to 46 on 12/15     NPH at bedtime increased to 52 units on 12/15   Sliding scale changed to sensitive CBG (last 3)  Recent Labs    03/27/17 1626 03/27/17 2113 03/28/17 0630  GLUCAP 155* 172* 134*    Overall improving on 12/18 11. CAD with CABG. Continue Plavix and aspirin. No chest pain or shortness of breath 12. Diastolic congestive heart failure. Monitor for any signs of fluid overload Filed Weights   03/27/17 0532 03/27/17 0845 03/27/17 1930  Weight: 56 kg (123 lb 7.3 oz) 66 kg (145 lb 8.1 oz) 58 kg (127 lb 13.9 oz)  13. SIRS. Resolved.   Antibiotics completed. Blood cultures no growth. Monitor for any fever  14. Hypertension. Coreg 12.5 mg twice daily, decreased to 6.25 on 12/11, DC'd on 12/14, 6.25 restarted on 1218    Vitals:   03/28/17 0554 03/28/17 0840  BP:  107/73 110/60  Pulse: 94 (!) 160  Resp: 18   Temp: 98 F (36.7 C)   SpO2: 98%      Symptomatic orthostasis, abdominal binder   Encourage fluids    Low dose Midodrin on 12/13 with some improvement   Improving on 12/17 15. New onset atrial fibrillation. Follow-up cardiology services. Coreg 6.25  DC'd on 12/14, 6.25 restarted on 12/18 16. Hyperlipidemia. Lipitor 17. Constipation. Laxative assistance 18. Wandering pacemaker/atrial tachycardia   Coreg 6.25 restarted on 12/18   Will consult cards  LOS (Days) 14 A FACE TO FACE EVALUATION WAS PERFORMED  Ankit Karis Jubanil Patel 03/28/2017 8:51 AM

## 2017-03-28 NOTE — Discharge Summary (Signed)
Discharge summary job [id number]

## 2017-03-28 NOTE — Progress Notes (Signed)
[facility] ROUNDING NOTE   Subjective:  Continues to make good progress  He had a new R BKA  11/21  He is planning for going home on Sat   [facility] 6 exchanges/day one mid-day exchange (occurs around 9am).  2.5 liters of 2.5% per [facility] 2 liters of 2.5% for last fill and mid day drain. Dr. Denny PeonBaveja.  -baseline weight for him pre amputation was around 190lbs ~86kg     Objective:  Vital signs in last 24 hours:  Temp:  [97.7 F (36.5 C)-98.3 F (36.8 C)] 98 F (36.7 C) (12/18 0554) Pulse Rate:  [59-170] 160 (12/18 0840) Resp:  [16-18] 18 (12/18 0554) BP: (98-154)/(59-73) 110/60 (12/18 0840) SpO2:  [92 %-100 %] 98 % (12/18 0554) Weight:  [127 lb 13.9 oz (58 kg)] 127 lb 13.9 oz (58 kg) (12/17 1930)  Weight change: 22 lb 0.7 oz (10 kg) Filed Weights   03/27/17 0532 03/27/17 0845 03/27/17 1930  Weight: 123 lb 7.3 oz (56 kg) 145 lb 8.1 oz (66 kg) 127 lb 13.9 oz (58 kg)    Intake/Output: I/O last 3 completed shifts: In: [id number] [P.O.:720; Other:14992] Out: [id number] [Other:16028]   Intake/Output this shift:  Total I/O In: 120 [P.O.:120] Out: -   General: NAD breathing easily supine in bed Heart: RR with ^ ectopy Lungs: no rales Abdomen: soft NT Extremities: left BKA in sock right wrapped - no sig edema Dialysis Access: PD cath     Basic Metabolic Panel: Recent Labs  Lab 03/24/17 0525 03/25/17 0951 03/26/17 0630 03/28/17 0443  NA 131* 128* 132* 132*  K 3.3* 4.1 4.0 4.3  CL 90* 91* 96* 97*  CO2 29 28 26 27   GLUCOSE 303* 289* 143* 191*  BUN 54* 53* 50* 50*  CREATININE 4.77* 4.50* 4.30* 4.16*  CALCIUM 8.7* 8.0* 8.3* 8.5*  PHOS  --  3.5  --  3.2    Liver Function Tests: Recent Labs  Lab 03/25/17 0951 03/28/17 0443  ALBUMIN 1.6* 1.7*   No results for input(s): LIPASE, AMYLASE in the last 168 hours. No results for input(s): AMMONIA in the last 168 hours.  CBC: Recent Labs  Lab  03/23/17 0648 03/24/17 0525 03/26/17 0630 03/28/17 0443  WBC 9.2 8.2 10.1 7.6  NEUTROABS 6.3  --   --   --   HGB 10.3* 10.2* 9.5* 10.1*  HCT 32.5* 32.9* 30.8* 32.8*  MCV 94.5 95.1 94.8 95.6  PLT 497* 486* 422* 451*    Cardiac Enzymes: No results for input(s): CKTOTAL, CKMB, CKMBINDEX, TROPONINI in the last 168 hours.  BNP: Invalid input(s): POCBNP  CBG: Recent Labs  Lab 03/27/17 0646 03/27/17 1158 03/27/17 1626 03/27/17 2113 03/28/17 0630  GLUCAP 227* 159* 155* 172* 134*    Microbiology: Results for orders placed or performed during the hospital encounter of 03/12/17  Culture, blood (routine x 2)     Status: None   Collection Time: 03/12/17  8:10 PM  Result Value Ref Range Status   Specimen Description BLOOD RIGHT HAND  Final   Special Requests IN PEDIATRIC BOTTLE Blood Culture adequate volume  Final   Culture NO GROWTH 5 DAYS  Final   Report Status 03/17/2017 FINAL  Final  Culture, blood (routine x 2)     Status: None   Collection Time: 03/12/17  8:20 PM  Result Value Ref Range Status   Specimen Description BLOOD RIGHT THUMB  Final   Special Requests IN PEDIATRIC BOTTLE Blood Culture  adequate volume  Final   Culture NO GROWTH 5 DAYS  Final   Report Status 03/17/2017 FINAL  Final  Culture, Urine     Status: None   Collection Time: 03/13/17  6:58 AM  Result Value Ref Range Status   Specimen Description URINE, CLEAN CATCH  Final   Special Requests NONE  Final   Culture NO GROWTH  Final   Report Status 03/14/2017 FINAL  Final    Coagulation Studies: No results for input(s): LABPROT, INR in the last 72 hours.  Urinalysis: No results for input(s): COLORURINE, LABSPEC, PHURINE, GLUCOSEU, HGBUR, BILIRUBINUR, KETONESUR, PROTEINUR, UROBILINOGEN, NITRITE, LEUKOCYTESUR in the last 72 hours.  Invalid input(s): APPERANCEUR    Imaging: No results found.   Medications:    . aspirin EC  81 mg Oral Daily  . atorvastatin  80 mg Oral q1800  . calcium acetate  667  mg Oral TID WC  . carvedilol  6.25 mg Oral BID WC  . chlorhexidine  15 mL Mouth/Throat BID  . clopidogrel  75 mg Oral Daily  . darbepoetin (ARANESP) injection - NON-DIALYSIS  200 mcg Subcutaneous Q Sat-1800  . docusate sodium  100 mg Oral BID  . feeding supplement (PRO-STAT SUGAR FREE 64)  30 mL Oral BID  . gentamicin cream  1 application Topical Daily  . insulin aspart  0-8 Units Subcutaneous TID WC  . insulin NPH Human  46 Units Subcutaneous QAC breakfast  . insulin NPH Human  52 Units Subcutaneous QHS  . midodrine  2.5 mg Oral BID WC  . multivitamin  1 tablet Oral Daily  . potassium chloride  30 mEq Oral Daily  . protein supplement shake  2 oz Oral QID  . [START ON 03/30/2017] Vitamin D (Ergocalciferol)  50,000 Units Oral Q30 days   acetaminophen **OR** acetaminophen, dianeal solution for CAPD/CCPD with heparin, MUSCLE RUB, nitroGLYCERIN, ondansetron, oxyCODONE, polyethylene glycol, sorbitol  Assessment/ Plan:  1. PAD s/p revision L BKA &new R BKA on 11/21 - stable 2. ESRD - on [facility] in [place] x 2 years, orders written- ongoing- repeat labs Tuesday 3. HTN/vol -symptomatic hypotension last week, lowered UF to all 1.5% and gave 1L NS x 2. BP up and serum Na better 132 12/16 . Not eating a lot. Cont same UF for now.  Wts currently ~56 kg - though a fair amount of variance with measures 4. Secondary hyperparathyroidism -P 3.5 Cont to hold phoslo, Follow. No VDRA but getting 50 K Drisdol per month- starting 12/20  5. Anemia of CKD- hgb 9.5 , follow trend. Aranesp 200mcg q wk - needs Fe studies - ordered with am labs for Tuesday- last tsat 18% in Oct 6. Severe malnutrition, Alb 1.6, Prostat +Nepro 7. A fib w/ RVR -per primary 8. DMT2 - per primary 9. Hypokalemia -better,K4.0, cont KDur 30 /day       LOS: 14 [name] W @TODAY @11 :49 AM

## 2017-03-28 NOTE — Progress Notes (Signed)
Occupational Therapy Note  Patient Details  Name: Joshua [name] MRN: [id number] Date of Birth: 01-01-1941  Pt missed 60 mins OT secondary to being on bedrest because of cardiac issues.    [name],Mads OTR/L 03/28/2017, 11:15 AM

## 2017-03-28 NOTE — Progress Notes (Signed)
Patient reports feeling "ok" at this time. HR remains 112-120'[name] . No shortness of breath at this time. Report given to RN 3E, Patient and wife verbalized understanding of  Telemetry monitoring. Continue with plan of care.  [name], [name]

## 2017-03-29 ENCOUNTER — Inpatient Hospital Stay (HOSPITAL_COMMUNITY): Payer: Medicare Other

## 2017-03-29 ENCOUNTER — Other Ambulatory Visit: Payer: Self-pay

## 2017-03-29 DIAGNOSIS — E1122 Type 2 diabetes mellitus with diabetic chronic kidney disease: Secondary | ICD-10-CM

## 2017-03-29 DIAGNOSIS — E785 Hyperlipidemia, unspecified: Secondary | ICD-10-CM

## 2017-03-29 DIAGNOSIS — Z89511 Acquired absence of right leg below knee: Secondary | ICD-10-CM

## 2017-03-29 DIAGNOSIS — I10 Essential (primary) hypertension: Secondary | ICD-10-CM

## 2017-03-29 DIAGNOSIS — N186 End stage renal disease: Secondary | ICD-10-CM

## 2017-03-29 DIAGNOSIS — Z89512 Acquired absence of left leg below knee: Secondary | ICD-10-CM

## 2017-03-29 LAB — GLUCOSE, CAPILLARY
Glucose-Capillary: 55 mg/dL — ABNORMAL LOW (ref 65–99)
Glucose-Capillary: 131 mg/dL — ABNORMAL HIGH (ref 65–99)
Glucose-Capillary: 259 mg/dL — ABNORMAL HIGH (ref 65–99)
Glucose-Capillary: 82 mg/dL (ref 65–99)
Glucose-Capillary: 148 mg/dL — ABNORMAL HIGH (ref 65–99)

## 2017-03-29 LAB — CBC
HCT: 33.9 % — ABNORMAL LOW (ref 39.0–52.0)
Hemoglobin: 10.5 g/dL — ABNORMAL LOW (ref 13.0–17.0)
MCH: 30 pg (ref 26.0–34.0)
MCHC: 31 g/dL (ref 30.0–36.0)
MCV: 96.9 fL (ref 78.0–100.0)
Platelets: 445 10*3/uL — ABNORMAL HIGH (ref 150–400)
RBC: 3.5 MIL/uL — ABNORMAL LOW (ref 4.22–5.81)
RDW: 17.1 % — ABNORMAL HIGH (ref 11.5–15.5)
WBC: 7.1 10*3/uL (ref 4.0–10.5)

## 2017-03-29 LAB — COMPREHENSIVE METABOLIC PANEL
ALT: 25 U/L (ref 17–63)
AST: 29 U/L (ref 15–41)
Albumin: 1.7 g/dL — ABNORMAL LOW (ref 3.5–5.0)
Alkaline Phosphatase: 100 U/L (ref 38–126)
Anion gap: 12 (ref 5–15)
BUN: 46 mg/dL — ABNORMAL HIGH (ref 6–20)
CO2: 25 mmol/L (ref 22–32)
Calcium: 8.8 mg/dL — ABNORMAL LOW (ref 8.9–10.3)
Chloride: 96 mmol/L — ABNORMAL LOW (ref 101–111)
Creatinine, Ser: 4.07 mg/dL — ABNORMAL HIGH (ref 0.61–1.24)
GFR calc Af Amer: 15 mL/min — ABNORMAL LOW (ref >60)
GFR calc non Af Amer: 13 mL/min — ABNORMAL LOW (ref >60)
Glucose, Bld: 159 mg/dL — ABNORMAL HIGH (ref 65–99)
Potassium: 4.4 mmol/L (ref 3.5–5.1)
Sodium: 133 mmol/L — ABNORMAL LOW (ref 135–145)
Total Bilirubin: 0.5 mg/dL (ref 0.3–1.2)
Total Protein: 5.5 g/dL — ABNORMAL LOW (ref 6.5–8.1)

## 2017-03-29 MED ORDER — ATORVASTATIN CALCIUM 80 MG PO TABS
80.0000 mg | ORAL_TABLET | Freq: Every day | ORAL | Status: DC
  Administered 2017-03-29 – 2017-03-30 (×2): 80 mg via ORAL
  Filled 2017-03-29 (×2): qty 1

## 2017-03-29 MED ORDER — INSULIN NPH (HUMAN) (ISOPHANE) 100 UNIT/ML ~~LOC~~ SUSP
25.0000 [IU] | Freq: Two times a day (BID) | SUBCUTANEOUS | Status: DC
  Administered 2017-03-29 – 2017-03-31 (×5): 25 [IU] via SUBCUTANEOUS
  Filled 2017-03-29 (×2): qty 10

## 2017-03-29 MED ORDER — HEPARIN 1000 UNIT/ML FOR PERITONEAL DIALYSIS: 500.0000 [IU] | INTRAMUSCULAR | Status: DC | PRN

## 2017-03-29 MED ORDER — CALCIUM ACETATE (PHOS BINDER) 667 MG PO CAPS: 667.0000 mg | ORAL_CAPSULE | Freq: Three times a day (TID) | ORAL | Status: DC

## 2017-03-29 MED ORDER — MIDODRINE HCL 5 MG PO TABS
2.5000 mg | ORAL_TABLET | Freq: Two times a day (BID) | ORAL | Status: DC
  Administered 2017-03-29 – 2017-03-31 (×4): 2.5 mg via ORAL
  Filled 2017-03-29 (×4): qty 1

## 2017-03-29 MED ORDER — POLYETHYLENE GLYCOL 3350 17 G PO PACK
17.0000 g | PACK | Freq: Every day | ORAL | Status: DC
  Administered 2017-03-30: 17 g via ORAL
  Filled 2017-03-29 (×2): qty 1

## 2017-03-29 MED ORDER — ASPIRIN EC 81 MG PO TBEC
81.0000 mg | DELAYED_RELEASE_TABLET | Freq: Every day | ORAL | Status: DC
  Administered 2017-03-29 – 2017-03-31 (×3): 81 mg via ORAL
  Filled 2017-03-29 (×3): qty 1

## 2017-03-29 MED ORDER — DARBEPOETIN ALFA 200 MCG/0.4ML IJ SOSY: 200.0000 ug | PREFILLED_SYRINGE | INTRAMUSCULAR | Status: DC

## 2017-03-29 MED ORDER — CARVEDILOL 6.25 MG PO TABS
6.2500 mg | ORAL_TABLET | Freq: Two times a day (BID) | ORAL | Status: DC
  Administered 2017-03-29 – 2017-03-31 (×5): 6.25 mg via ORAL
  Filled 2017-03-29 (×5): qty 1

## 2017-03-29 MED ORDER — POTASSIUM CHLORIDE CRYS ER 20 MEQ PO TBCR
30.0000 meq | EXTENDED_RELEASE_TABLET | Freq: Every day | ORAL | Status: DC
  Administered 2017-03-29 – 2017-03-31 (×3): 30 meq via ORAL
  Filled 2017-03-29 (×3): qty 1

## 2017-03-29 MED ORDER — RENA-VITE PO TABS
1.0000 | ORAL_TABLET | Freq: Every day | ORAL | Status: DC
  Administered 2017-03-29 – 2017-03-30 (×2): 1 via ORAL
  Filled 2017-03-29 (×2): qty 1

## 2017-03-29 MED ORDER — GENTAMICIN SULFATE 0.1 % EX CREA: 1.0000 "application " | TOPICAL_CREAM | Freq: Every day | CUTANEOUS | Status: DC

## 2017-03-29 MED ORDER — DOCUSATE SODIUM 100 MG PO CAPS
100.0000 mg | ORAL_CAPSULE | Freq: Two times a day (BID) | ORAL | Status: DC
  Administered 2017-03-29 – 2017-03-31 (×5): 100 mg via ORAL
  Filled 2017-03-29 (×5): qty 1

## 2017-03-29 MED ORDER — PRO-STAT SUGAR FREE PO LIQD
30.0000 mL | Freq: Two times a day (BID) | ORAL | Status: DC
  Administered 2017-03-29 – 2017-03-31 (×5): 30 mL via ORAL
  Filled 2017-03-29 (×5): qty 30

## 2017-03-29 MED ORDER — INSULIN ASPART 100 UNIT/ML ~~LOC~~ SOLN
0.0000 [IU] | Freq: Three times a day (TID) | SUBCUTANEOUS | Status: DC
  Administered 2017-03-29: 5 [IU] via SUBCUTANEOUS
  Administered 2017-03-30 (×2): 2 [IU] via SUBCUTANEOUS
  Administered 2017-03-31: 5 [IU] via SUBCUTANEOUS

## 2017-03-29 MED ORDER — CLOPIDOGREL BISULFATE 75 MG PO TABS
75.0000 mg | ORAL_TABLET | Freq: Every day | ORAL | Status: DC
Start: 2017-03-29 — End: 2017-03-31
  Administered 2017-03-29 – 2017-03-31 (×3): 75 mg via ORAL
  Filled 2017-03-29 (×3): qty 1

## 2017-03-29 MED ORDER — OXYCODONE HCL 5 MG PO TABS
5.0000 mg | ORAL_TABLET | Freq: Four times a day (QID) | ORAL | Status: DC | PRN
  Administered 2017-03-29 – 2017-03-31 (×2): 5 mg via ORAL
  Filled 2017-03-29 (×2): qty 1

## 2017-03-29 NOTE — Consult Note (Addendum)
WOC Nurse wound consult note Reason for Consult: Consult requested for left and right stumps.  Pt previously had surgery by Dr Lajoyce Cornersuda of the ortho service to BLE. Wound type: Left stump with few scattered areas of dry scabs, 2X.5cm, no odor, drainage, or fluctuance. Right stump with 100% tightly adhered eschar; 4X18 cm, pt states it is painful all the time; no odor, drainage, or fluctuance. Dressing procedure/placement/frequency: xeroform gauze to avoid adherence over the affected area.  Recommend re-consult to Dr Lajoyce Cornersuda for further assessment and plan of care. Discussed via phone call with primary team. Please re-consult if further assistance is needed.  Thank-you,  Cammie Mcgeeawn Ariellah Faust MSN, RN, CWOCN, [place], CNS [phone]

## 2017-03-29 NOTE — Progress Notes (Signed)
[facility] ROUNDING NOTE   Subjective:    Continues to make good progress  He had a new R BKA  11/21  He is planning for going home on Sat  Developed some atrial tachyarrhythmia treated with amiodarone    Dialysis Orders: [facility] [place] [facility]. 6 exchanges/day one mid-day exchange (occurs around 9am).  2.5 liters of 2.5% per [facility] 2 liters of 2.5% for last fill and mid day drain. Dr. Denny PeonBaveja.  -baseline weight for him pre amputation was around 190lbs ~86kg   Objective:  Vital signs in last 24 hours:  Temp:  [97.6 F (36.4 C)-98.5 F (36.9 C)] 98.3 F (36.8 C) (12/19 0859) Pulse Rate:  [65-97] 97 (12/19 0859) Resp:  [18-20] 18 (12/19 0859) BP: (122-149)/(54-64) 122/56 (12/19 0859) SpO2:  [98 %-100 %] 100 % (12/19 0859) FiO2 (%):  [2 %] 2 % (12/18 2102) Weight:  [154 lb 5.2 oz (70 kg)-167 lb 8.8 oz (76 kg)] 154 lb 5.2 oz (70 kg) (12/19 0859)  Weight change:  Filed Weights   03/28/17 1842 03/29/17 0435 03/29/17 0859  Weight: 167 lb 8.8 oz (76 kg) 160 lb 15 oz (73 kg) 154 lb 5.2 oz (70 kg)    Intake/Output: I/O last 3 completed shifts: In: 120 [P.O.:120] Out: 0    Intake/Output this shift:  No intake/output data recorded.   General:NAD breathing easily supine in bed Heart:RR with ^ ectopy Lungs:no rales Abdomen:soft NT Extremities:left BKA in sock right wrapped - no sig edema Dialysis Access:PD cath     Basic Metabolic Panel: Recent Labs  Lab 03/24/17 0525 03/25/17 0951 03/26/17 0630 03/28/17 0443 03/28/17 1538 03/29/17 0455  NA 131* 128* 132* 132*  --  133*  K 3.3* 4.1 4.0 4.3  --  4.4  CL 90* 91* 96* 97*  --  96*  CO2 29 28 26 27   --  25  GLUCOSE 303* 289* 143* 191*  --  159*  BUN 54* 53* 50* 50*  --  46*  CREATININE 4.77* 4.50* 4.30* 4.16* 4.30* 4.07*  CALCIUM 8.7* 8.0* 8.3* 8.5*  --  8.8*  PHOS  --  3.5  --  3.2  --   --     Liver Function Tests: Recent Labs  Lab 03/25/17 0951 03/28/17 0443  03/29/17 0455  AST  --   --  29  ALT  --   --  25  ALKPHOS  --   --  100  BILITOT  --   --  0.5  PROT  --   --  5.5*  ALBUMIN 1.6* 1.7* 1.7*   No results for input(s): LIPASE, AMYLASE in the last 168 hours. No results for input(s): AMMONIA in the last 168 hours.  CBC: Recent Labs  Lab 03/23/17 0648 03/24/17 0525 03/26/17 0630 03/28/17 0443 03/28/17 1538 03/29/17 0455  WBC 9.2 8.2 10.1 7.6 10.2 7.1  NEUTROABS 6.3  --   --   --   --   --   HGB 10.3* 10.2* 9.5* 10.1* 9.7* 10.5*  HCT 32.5* 32.9* 30.8* 32.8* 32.0* 33.9*  MCV 94.5 95.1 94.8 95.6 97.6 96.9  PLT 497* 486* 422* 451* 463* 445*    Cardiac Enzymes: No results for input(s): CKTOTAL, CKMB, CKMBINDEX, TROPONINI in the last 168 hours.  BNP: Invalid input(s): POCBNP  CBG: Recent Labs  Lab 03/27/17 2113 03/28/17 0630 03/28/17 1139 03/28/17 1651 03/28/17 2100  GLUCAP 172* 134* 99 55* 131*    Microbiology: Results for orders placed or  performed during the hospital encounter of 03/12/17  Culture, blood (routine x 2)     Status: None   Collection Time: 03/12/17  8:10 PM  Result Value Ref Range Status   Specimen Description BLOOD RIGHT HAND  Final   Special Requests IN PEDIATRIC BOTTLE Blood Culture adequate volume  Final   Culture NO GROWTH 5 DAYS  Final   Report Status 03/17/2017 FINAL  Final  Culture, blood (routine x 2)     Status: None   Collection Time: 03/12/17  8:20 PM  Result Value Ref Range Status   Specimen Description BLOOD RIGHT THUMB  Final   Special Requests IN PEDIATRIC BOTTLE Blood Culture adequate volume  Final   Culture NO GROWTH 5 DAYS  Final   Report Status 03/17/2017 FINAL  Final  Culture, Urine     Status: None   Collection Time: 03/13/17  6:58 AM  Result Value Ref Range Status   Specimen Description URINE, CLEAN CATCH  Final   Special Requests NONE  Final   Culture NO GROWTH  Final   Report Status 03/14/2017 FINAL  Final    Coagulation Studies: No results for input(s): LABPROT,  INR in the last 72 hours.  Urinalysis: No results for input(s): COLORURINE, LABSPEC, PHURINE, GLUCOSEU, HGBUR, BILIRUBINUR, KETONESUR, PROTEINUR, UROBILINOGEN, NITRITE, LEUKOCYTESUR in the last 72 hours.  Invalid input(s): APPERANCEUR    Imaging: No results found.   Medications:    . amiodarone  200 mg Oral BID  . gentamicin cream  1 application Topical Daily  . heparin  5,000 Units Subcutaneous Q8H   acetaminophen, dianeal solution for CAPD/CCPD with heparin  Assessment/ Plan:  1. 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 ESRD - on [facility]D in [place] 2 years, orders written- ongoing- repeat labs Tuesday 3. HTN/vol -symptomatic hypotensionlast week, lowered UF to all 1.5% and gave 1L NS x 2. BP up and serum Na better132 12/16. Not eating a lot. Cont same UF for now.Wts currently ~56 kg - though a fair amount of variance with measures 4. Secondary hyperparathyroidism -P 3.5Cont to hold phoslo, Follow. No VDRA but getting 50 K Drisdol per month- starting 12/20 5. Anemia of CKD-hgb 9.5, follow trend. Aranesp 200mcg q wk- needs Fe studies - orderedwith am labs for Tuesday- last tsat 18% in Oct 6. Severe malnutrition, Alb 1.6, Prostat +Nepro 7. A fib [name]/ RVR -per primary 8. DMT2 - per primary 9. Hypokalemia -better,K4.0, cont KDur 30 /day 10. Atrial tachycardia  PAT  Settled after amiodarone      LOS: 1 [name] @TODAY @10 :19 AM

## 2017-03-29 NOTE — Progress Notes (Signed)
PD tx initiated via tenckhoff w/o problem, VSS, report given to Allayne ButcherSalome Campbell, RN

## 2017-03-29 NOTE — Progress Notes (Signed)
Progress Note  Patient Name: Joshua [name] Date of Encounter: 03/29/2017  Primary Cardiologist: [facility] Wilford [name].   Subjective   Denies any complaints this am.  PAT has settled down after starting Amio  Inpatient Medications    Scheduled Meds: . amiodarone  200 mg Oral BID  . gentamicin cream  1 application Topical Daily  . heparin  5,000 Units Subcutaneous Q8H   Continuous Infusions:  PRN Meds: acetaminophen, dianeal solution for CAPD/CCPD with heparin   Vital Signs    Vitals:   03/28/17 1842 03/28/17 2102 03/29/17 0007 03/29/17 0435  BP: 131/64 132/60 (!) 129/54 (!) 149/54  Pulse: 68 92 86 76  Resp: 20 18 18 18   Temp: 97.6 F (36.4 C) 98 F (36.7 C) 97.7 F (36.5 C) 97.9 F (36.6 C)  TempSrc: Oral Oral Axillary Axillary  SpO2: 100% 100% 98% 100%  Weight: 167 lb 8.8 oz (76 kg)   160 lb 15 oz (73 kg)    Intake/Output Summary (Last 24 hours) at 03/29/2017 0853 Last data filed at 03/28/2017 2151 Gross per 24 hour  Intake 120 ml  Output 0 ml  Net 120 ml   Filed Weights   03/28/17 1842 03/29/17 0435  Weight: 167 lb 8.8 oz (76 kg) 160 lb 15 oz (73 kg)    Telemetry    NSR with short burst of PAT - Personally Reviewed  ECG    NSR with QTC 498msec - Personally Reviewed  Physical Exam   GEN: No acute distress.   Neck: No JVD Cardiac: RRR, no murmurs, rubs, or gallops.  Respiratory: Clear to auscultation bilaterally. GI: Soft, nontender, non-distended  MS: No edema; No deformity. Neuro:  Nonfocal  Psych: Normal affect   Labs    Chemistry Recent Labs  Lab 03/25/17 0951 03/26/17 0630 03/28/17 0443 03/28/17 1538 03/29/17 0455  NA 128* 132* 132*  --  133*  K 4.1 4.0 4.3  --  4.4  CL 91* 96* 97*  --  96*  CO2 28 26 27   --  25  GLUCOSE 289* 143* 191*  --  159*  BUN 53* 50* 50*  --  46*  CREATININE 4.50* 4.30* 4.16* 4.30* 4.07*  CALCIUM 8.0* 8.3* 8.5*  --  8.8*  PROT  --   --   --   --  5.5*  ALBUMIN 1.6*  --   1.7*  --  1.7*  AST  --   --   --   --  29  ALT  --   --   --   --  25  ALKPHOS  --   --   --   --  100  BILITOT  --   --   --   --  0.5  GFRNONAA 12* 12* 13* 12* 13*  GFRAA 13* 14* 15* 14* 15*  ANIONGAP 9 10 8   --  12     Hematology Recent Labs  Lab 03/28/17 0443 03/28/17 1538 03/29/17 0455  WBC 7.6 10.2 7.1  RBC 3.43* 3.28* 3.50*  HGB 10.1* 9.7* 10.5*  HCT 32.8* 32.0* 33.9*  MCV 95.6 97.6 96.9  MCH 29.4 29.6 30.0  MCHC 30.8 30.3 31.0  RDW 16.7* 17.2* 17.1*  PLT 451* 463* 445*    Cardiac EnzymesNo results for input(s): TROPONINI in the last 168 hours. No results for input(s): TROPIPOC in the last 168 hours.   BNPNo results for input(s): BNP, PROBNP in the last 168 hours.  DDimer No results for input(s): DDIMER in the last 168 hours.   Radiology    No results found.  Cardiac Studies   Study Conclusions  - Left ventricle: The cavity size was normal. There was moderate concentric hypertrophy. Systolic function was severely reduced. The estimated ejection fraction was in the range of 25% to 30%. Hypokinesis of the anteroseptal, inferoseptal, apical inferior and apical myocardium. Features are consistent with a pseudonormal left ventricular filling pattern, with concomitant abnormal relaxation and increased filling pressure (grade 2 diastolic dysfunction). Doppler parameters are consistent with high ventricular filling pressure. - Aortic valve: Valve mobility was restricted. Transvalvular velocity was within the normal range. There was no stenosis. There was trivial regurgitation. - Mitral valve: Transvalvular velocity was within the normal range. There was no evidence for stenosis. There was trivial regurgitation. - Left atrium: The atrium was severely dilated. - Right ventricle: The cavity size was normal. Wall thickness was normal. Systolic function was normal. - Tricuspid valve: There was trivial regurgitation. - Pulmonary  arteries: Systolic pressure was within the normal range. PA peak pressure: 32 mm Hg (S).    Patient Profile     76 y.o. male with complicated past medical history including coronary artery disease, prior stent to RCA in 07/2016 in the setting of non-STEMI, ischemic cardiomyopathy status post [employer] ICD placement in August 2018 with hypertension hyperlipidemia end-stage renal disease on peritoneal dialysis and severe peripheral arterial disease with amputations who we were asked to see for clarification of possible diagnosis of either wandering atrial pacemaker or atrial fibrillation.      Assessment & Plan    Paroxysmal atrial tachycardia -initial consult by Dr. Anne [name] on 03/13/2017 showed extensive data review, telemetry review, EKG reviews all demonstrating wandering atrial pacemaker/multifocal atrial tachycardia with no evidence of atrial fibrillation. There were clear P waves preceding QRS complexes and during areas of tachycardia the P waves are buried within the T waves. -Defibrillator interrogation only demonstrated irregularity of ventricular beats because there is no atrial lead present therefore this data is not helpful in this diagnosis. Clearly telemetry and EKG however demonstrate P waves, showing wandering atrial pacemaker. -- Since there is no evidence of atrial fibrillation, no anticoagulation was started -- now reconsulted due to increased heart rate and SVT on EKG.  EKGs all reveal sinus rhythm with runs of atrial tachycardia. -- have not been able to increase carvedilol further than 6.25mg  BID due to problems with hypotension on HD.  -- started Amio 200mg  BID with significant improvement in atrial tachycardia burden   -- ALT was mildly elevated last time it was checked and repeat this am is normal. -- continue Amio load and follow QTc.  Likely will be stable for discharge from Card standpoint on Friday  Ischemic cardiomyopathy -EF 35%, fluid controlled by  dialysis. -he is currently on carvedilol 6.25mg  BID.  Symptomatic hypotension limits further titration of BP meds.   End-stage renal disease -On peritoneal dialysis. -Repleting potassium  ASCAD - cath 01/2017 with  patent proximal RCA stent, a patent LIMA to the LAD. He has a patent sequential graft to 2 obtuse marginal branches. The first anastomosis has about a 60- 75% stenosis at the anastomosis.. The vein graft crossing that obtuse marginal branch has about 50-60% stenosis. There are no critical stenoses  lesions identified. Continue medical therapy recommended.  - continue statin, ASA, Plavix and BB    For questions or updates, please contact [facility] Please consult [email or web address] for contact info under Cardiology/STEMI.  Signed, Armanda Magicraci Nekeya Briski, MD  03/29/2017, 8:53 AM

## 2017-03-29 NOTE — Progress Notes (Signed)
[facility] PROGRESS NOTE  [name] [name] DOB: 08/11/1940 DOA: 03/28/2017 PCP: Dorice Lamashivianathan, Susan S, MD  Interim summary and HPI [name] with complicated medical history. Patient has ESRD on PD, S/P bilateral BKA, ischemic cardiomyopathy, DM, PVD and S/P AICD placement. Patient had right BKA some time in November of this year and was transferred to rehab facility but has been unstable for rehab. Patient has had significant tachycardia with low to low normal blood pressure. Found with MAT. Cardiology consulted and recommended transfer to inpatient setting for further evaluation and treatment.  Assessment/Plan: 1-MAT -Treatment as per cardiology -Good response to the use of amiodarone -Continue monitoring on telemetry and follow further recommendations.  2-end-stage renal disease on PD -renal service on board and will follow recommendations. -continue PD   3-status post right BKA -Wound care has been consulted to continue care after amputation -Given findings on his wound and complaints of pain orthopedic doctor ([name]) has been re-consulted. 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  Either home with home health services versus return home to inpatient rehab once medically stable prior to home discharge.   Consultants:  Renal  service  Orthopedic service  Cardiology service  Procedures:  None   Antibiotics:  None   HPI/Subjective: Denies chest pain, shortness of breath and palpitations.  No nausea, vomiting or abdominal pain.  Patient heart rate improved with the use of amiodarone.  Objective: Vitals:   03/29/17 0859 03/29/17 1144  BP: (!) 122/56 (!) 140/52  Pulse: 97 87  Resp: 18 18  Temp: 98.3 F (36.8 C) 97.7 F (36.5 C)  SpO2: 100% 100%    Intake/Output Summary (Last 24 hours) at 03/29/2017 1448 Last data filed at 03/29/2017 1404 Gross per 24 hour  Intake 8119112855 ml  Output 13160 ml  Net -305 ml   Filed Weights   03/29/17 0435 03/29/17 0830 03/29/17 0859  Weight: 73 kg (160 lb 15 oz) 69 kg (152 lb 1.9 oz) 70 kg (154 lb 5.2 oz)    Exam:   General: Afebrile, no chest pain, no shortness of breath.  Denies palpitation and overall feeling slightly better.  Cardiovascular: Rate control, S1 and S2 appreciated, No rubs, no gallops, no JVD.  Respiratory: Good air movement bilaterally, no wheezing, no crackles  Abdomen: Soft, nontender, nondistended, positive bowel sounds  Musculoskeletal: Bilateral BKA, right stump with a healing scar without drainage or odor but with significant pain on palpation.  Left stump with some scabs but no signs of super imposed infection.  There is no swelling on any extremities.  Data Reviewed: Basic Metabolic Panel: Recent Labs  Lab 03/24/17 0525 03/25/17 0951 03/26/17 0630 03/28/17 0443 03/28/17 1538 03/29/17 0455  NA 131* 128* 132* 132*  --  133*  K 3.3* 4.1 4.0 4.3  --  4.4  CL 90* 91* 96* 97*  --  96*  CO2 29 28 26 27   --  25  GLUCOSE 303* 289* 143* 191*  --  159*  BUN 54* 53* 50* 50*  --  46*  CREATININE 4.77* 4.50* 4.30* 4.16* 4.30* 4.07*  CALCIUM 8.7* 8.0* 8.3* 8.5*  --  8.8*  PHOS  --  3.5  --  3.2  --   --    Liver Function Tests: Recent Labs  Lab 03/25/17 0951 03/28/17 0443 03/29/17 0455  AST  --   --  29  ALT  --   --  25   ALKPHOS  --   --  100  BILITOT  --   --  0.5  PROT  --   --  5.5*  ALBUMIN 1.6* 1.7* 1.7*   CBC: Recent Labs  Lab 03/23/17 0648 03/24/17 0525 03/26/17 0630 03/28/17 0443 03/28/17 1538 03/29/17 0455  WBC 9.2 8.2 10.1 7.6 10.2 7.1  NEUTROABS 6.3  --   --   --   --   --   HGB 10.3* 10.2* 9.5* 10.1* 9.7* 10.5*  HCT 32.5* 32.9* 30.8* 32.8* 32.0* 33.9*  MCV 94.5 95.1 94.8 95.6 97.6 96.9  PLT 497* 486* 422* 451* 463* 445*   CBG: Recent Labs  Lab 03/28/17 0630 03/28/17 1139 03/28/17 1651 03/28/17 2100 03/29/17 1121  GLUCAP 134* 99 55* 131* 259*   Studies: No results found.  Scheduled Meds: . amiodarone  200 mg Oral BID  . aspirin EC  81 mg Oral Daily  . atorvastatin  80 mg Oral q1800  . carvedilol  6.25 mg Oral BID WC  . clopidogrel  75 mg Oral Daily  . [START ON 04/01/2017] Darbepoetin Alfa  200 mcg Subcutaneous Q Sat-1800  . docusate sodium  100 mg Oral BID  . feeding supplement (PRO-STAT SUGAR FREE 64)  30 mL Oral BID  . gentamicin cream  1 application Topical Daily  . heparin  5,000 Units Subcutaneous Q8H  . insulin aspart  0-9 Units Subcutaneous TID WC  . insulin NPH Human  25 Units Subcutaneous BID AC & HS  . midodrine  2.5 mg Oral BID WC  . multivitamin  1 tablet Oral QHS  . polyethylene glycol  17 g Oral Daily  . potassium chloride SA  30 mEq Oral Daily    Time spent: 25 minutes   [name]  [facility] Pager [phone]. If 7PM-7AM, please contact night-coverage at [email or web address], password [email or web address] 03/29/2017, 2:48 PM  LOS: 1 day

## 2017-03-30 ENCOUNTER — Other Ambulatory Visit: Payer: Self-pay

## 2017-03-30 ENCOUNTER — Telehealth: Payer: Self-pay | Admitting: Cardiology

## 2017-03-30 LAB — GLUCOSE, CAPILLARY
Glucose-Capillary: 188 mg/dL — ABNORMAL HIGH (ref 65–99)
Glucose-Capillary: 195 mg/dL — ABNORMAL HIGH (ref 65–99)
Glucose-Capillary: 93 mg/dL (ref 65–99)
Glucose-Capillary: 156 mg/dL — ABNORMAL HIGH (ref 65–99)

## 2017-03-30 MED ORDER — NEPRO/CARBSTEADY PO LIQD
237.0000 mL | Freq: Every day | ORAL | Status: DC
  Administered 2017-03-30: 237 mL via ORAL
  Filled 2017-03-30 (×2): qty 237

## 2017-03-30 MED ORDER — POLYETHYLENE GLYCOL 3350 17 G PO PACK
17.0000 g | PACK | Freq: Two times a day (BID) | ORAL | Status: DC
  Filled 2017-03-30 (×2): qty 1

## 2017-03-30 NOTE — Progress Notes (Signed)
Rehab admissions - I spoke with rehab MD Dr Allena KatzPatel and to rehab team.  Patient will not need to come back to acute inpatient rehab.  He was scheduled for discharge on 04/01/17.  He can discharge home from acute side of hospital anytime he is medically ready from our perspective.  I will speak to patient and his wife regarding the above information.  Call me for questions.  #[phone]

## 2017-03-30 NOTE — Progress Notes (Signed)
Progress Note  Patient Name: [name] Date of Encounter: 03/30/2017  [facility] Wilford GristKoontz.    Subjective   Denies any chest pain, SOB or palpitations.  No atrial tachy on monitor in past 24 hours after starting Amio  Inpatient Medications    Scheduled Meds: . amiodarone  200 mg Oral BID  . aspirin EC  81 mg Oral Daily  . atorvastatin  80 mg Oral q1800  . carvedilol  6.25 mg Oral BID WC  . clopidogrel  75 mg Oral Daily  . [START ON 04/01/2017] Darbepoetin Alfa  200 mcg Subcutaneous Q Sat-1800  . docusate sodium  100 mg Oral BID  . feeding supplement (PRO-STAT SUGAR FREE 64)  30 mL Oral BID  . gentamicin cream  1 application Topical Daily  . heparin  5,000 Units Subcutaneous Q8H  . insulin aspart  0-9 Units Subcutaneous TID WC  . insulin NPH Human  25 Units Subcutaneous BID AC & HS  . midodrine  2.5 mg Oral BID WC  . multivitamin  1 tablet Oral QHS  . polyethylene glycol  17 g Oral Daily  . potassium chloride SA  30 mEq Oral Daily   Continuous Infusions:  PRN Meds: acetaminophen, dianeal solution for CAPD/CCPD with heparin, oxyCODONE   Vital Signs    Vitals:   03/29/17 1144 03/29/17 1826 03/29/17 1942 03/30/17 0532  BP: (!) 140/52 (!) 117/52 (!) 107/50 (!) 133/53  Pulse: 87 80 80 75  Resp: 18 20 19 18   Temp: 97.7 F (36.5 C) 98.8 F (37.1 C) 98.6 F (37 C) 98 F (36.7 C)  TempSrc: Oral Oral Oral Axillary  SpO2: 100% 98% 99% 100%  Weight:  147 lb 11.3 oz (67 kg)  152 lb 1.9 oz (69 kg)    Intake/Output Summary (Last 24 hours) at 03/30/2017 0912 Last data filed at 03/30/2017 [id number] Gross per 24 hour  Intake 480 ml  Output 1 ml  Net 479 ml   Filed Weights   03/29/17 0859 03/29/17 1826 03/30/17 0532  Weight: 154 lb 5.2 oz (70 kg) 147 lb 11.3 oz (67 kg) 152 lb 1.9 oz (69 kg)    Telemetry    NSR - Personally Reviewed  ECG    NSR with QTc 507msec by EKG but inaccurate and actually 460msec by my  calculation - Personally Reviewed  Physical Exam   GEN: No acute distress.   Neck: No JVD Cardiac: RRR, no murmurs, rubs, or gallops.  Respiratory: Clear to auscultation bilaterally. GI: Soft, nontender, non-distended  MS: No edema;  Neuro:  Nonfocal  Psych: Normal affect   Labs    Chemistry Recent Labs  Lab 03/25/17 0951 03/26/17 0630 03/28/17 0443 03/28/17 1538 03/29/17 0455  NA 128* 132* 132*  --  133*  K 4.1 4.0 4.3  --  4.4  CL 91* 96* 97*  --  96*  CO2 28 26 27   --  25  GLUCOSE 289* 143* 191*  --  159*  BUN 53* 50* 50*  --  46*  CREATININE 4.50* 4.30* 4.16* 4.30* 4.07*  CALCIUM 8.0* 8.3* 8.5*  --  8.8*  PROT  --   --   --   --  5.5*  ALBUMIN 1.6*  --  1.7*  --  1.7*  AST  --   --   --   --  29  ALT  --   --   --   --  25  ALKPHOS  --   --   --   --  100  BILITOT  --   --   --   --  0.5  GFRNONAA 12* 12* 13* 12* 13*  GFRAA 13* 14* 15* 14* 15*  ANIONGAP 9 10 8   --  12     Hematology Recent Labs  Lab 03/28/17 0443 03/28/17 1538 03/29/17 0455  WBC 7.6 10.2 7.1  RBC 3.43* 3.28* 3.50*  HGB 10.1* 9.7* 10.5*  HCT 32.8* 32.0* 33.9*  MCV 95.6 97.6 96.9  MCH 29.4 29.6 30.0  MCHC 30.8 30.3 31.0  RDW 16.7* 17.2* 17.1*  PLT 451* 463* 445*    Cardiac EnzymesNo results for input(s): TROPONINI in the last 168 hours. No results for input(s): TROPIPOC in the last 168 hours.   BNPNo results for input(s): BNP, PROBNP in the last 168 hours.   DDimer No results for input(s): DDIMER in the last 168 hours.   Radiology    No results found.  Cardiac Studies  2D echo Study Conclusions  - Left ventricle: The cavity size was normal. There was moderate concentric hypertrophy. Systolic function was severely reduced. The estimated ejection fraction was in the range of 25% to 30%. Hypokinesis of the anteroseptal, inferoseptal, apical inferior and apical myocardium. Features are consistent with a pseudonormal left ventricular filling pattern, with  concomitant abnormal relaxation and increased filling pressure (grade 2 diastolic dysfunction). Doppler parameters are consistent with high ventricular filling pressure. - Aortic valve: Valve mobility was restricted. Transvalvular velocity was within the normal range. There was no stenosis. There was trivial regurgitation. - Mitral valve: Transvalvular velocity was within the normal range. There was no evidence for stenosis. There was trivial regurgitation. - Left atrium: The atrium was severely dilated. - Right ventricle: The cavity size was normal. Wall thickness was normal. Systolic function was normal. - Tricuspid valve: There was trivial regurgitation. - Pulmonary arteries: Systolic pressure was within the normal range. PA peak pressure: 32 mm Hg (S).      Patient Profile     76 y.o. male with complicated past medical history including coronary artery disease, prior stent to RCA in 07/2016 in the setting of non-STEMI, ischemic cardiomyopathy status post [employer] ICD placement in August 2018 with hypertension hyperlipidemia end-stage renal disease on peritoneal dialysis and severe peripheral arterial disease with amputations who we were asked to see for clarification of possible diagnosis of either wandering atrial pacemaker or atrial fibrillation which by EKG and tele c/w atrial tachycardia    Assessment & Plan    Paroxysmal atrial tachycardia -initial consult by Dr. Anne Fu on 03/13/2017 showed extensive data review, telemetry review, EKG reviews all demonstratingwandering atrial pacemaker/multifocal atrial tachycardia with no evidence of atrial fibrillation. Therewereclear P waves preceding QRS complexes and during areas of tachycardia the P waves are buried within the T waves. -Defibrillator interrogation only demonstratedirregularity of ventricular beats because there is no atrial lead present therefore this data is not helpful in this diagnosis.  Clearly telemetry and EKG however demonstrate P waves, showing wandering atrial pacemaker. -- Since there is no evidence of atrial fibrillation, no anticoagulationwas started -- now reconsulted due to increased heart rate and SVT on EKG. EKGs all reveal sinus rhythm with runs of atrial tachycardia. -- have not been able to increase carvedilol further than 6.25mg  BID due to problems with hypotension on HD.  -- started Amio 200mg  BID with significant improvement in atrial tachycardia burden and now complete resolution in the past 24 hours  with no atrial tach on tele since 12/18 -- ALT was mildly elevated last time it was checked and repeat LFTs normal. -- QTc on EKG read out as 507msec but inaccurate and recalculation by myself it is 460msec.  Continue Amio 200mg  BID  Ischemic cardiomyopathy -EF 35%, fluid controlled by dialysis. -he is currently oncarvedilol 6.25mg  BID. Symptomatic hypotension limits further titration of BP meds.   End-stage renal disease -On peritoneal dialysis. -Repleting potassium  ASCAD -cath 01/2017 with patent proximal RCA stent, a patent LIMA to the LAD. He has a patent sequential graft to 2 obtuse marginal branches. The first anastomosis has about a 60- 75% stenosis at the anastomosis.. The vein graft crossing that obtuse marginal branch has about 50-60% stenosis. There are no critical stenoses  lesions identified. Continue medical therapyrecommended.  - continue statin, ASA, Plavix and BB  No other recs at this time.  He has not had any further atrial tachycardia.  OK to send home on Amio 200mg  BID.  We will set up followup in a week in our office.  Will sign off.  Call with any questions.     For questions or updates, please contact [facility] Please consult [email or web address] for contact info under Cardiology/STEMI.      Signed, Armanda Magicraci [place] Pelto, MD  03/30/2017, 9:12 AM

## 2017-03-30 NOTE — Progress Notes (Signed)
PD tx completed overnight without issue.  UF 303 mL  Post tx weight : 65 kg on bed scale.  PD catheter capped, clamped, and secured on patient.  Will continue to monitor.

## 2017-03-30 NOTE — Telephone Encounter (Signed)
New Message     TOC 04/12/16 2p laura Annie ParasIngold

## 2017-03-30 NOTE — Evaluation (Addendum)
Physical Therapy Evaluation Patient Details Name: Joshua [name] MRN: [id number] DOB: 1940/09/27 Today's Date: 03/30/2017   History of Present Illness  Pt is a 76 y.o. male admitted from [facility] on 03/28/17 with significant tachycardia and hypotension. Has complicated PMH including ESRD, ischemica cardiomyopathy, HTN, DM, PVD s/p AICD placement. 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     Clinical Impression  Pt presents with an overall decrease in functional mobility secondary to above. PTA, pt currently receiving PT/OT at CIR since R BKA and L BKA revision on 03/01/17. Today, required min guard for lateral scoot from bed to chair; pt with increased difficulty completely offloading due to BUE weakness, which is not ideal for pressure relief and his sacral soreness. Pt reports he still requires minA for sliding board transfers, and his specific concern is [facility]. Recommend continued CIR-level therapies to maximize functional mobility and independence prior to d/c home. Will continue to follow acutely.     Follow Up Recommendations CIR;Supervision for mobility/OOB    Equipment Recommendations  Other (comment)(TBD next venue)    Recommendations for Other Services       Precautions / Restrictions Precautions Precautions: Fall Restrictions Weight Bearing Restrictions: Yes RLE Weight Bearing: Non weight bearing RLE Partial Weight Bearing Percentage or Pounds: BKA LLE Weight Bearing: Non weight bearing LLE Partial Weight Bearing Percentage or Pounds: BKA       Mobility  Bed Mobility Overal bed mobility: Needs Assistance Bed Mobility: Rolling;Sidelying to Sit Rolling: Modified independent (Device/Increase time) Sidelying to sit: Min guard       General bed mobility comments: Mod indep with hand rail for rolling onto L side. With bed deflated, min guard for sitting up; increased time and effort   Transfers Overall  transfer level: Needs assistance Equipment used: None Transfers: Lateral/Scoot Transfers          Lateral/Scoot Transfers: Min guard General transfer comment: Lateral scoot from bed to chair with min guard for balance. Pt with increased difficulty completely offloading with BUEs, requiring him to scoot causing increased friction on buttocks and sacrum  Ambulation/Gait                Stairs            Wheelchair Mobility    Modified Rankin (Stroke Patients Only)       Balance Overall balance assessment: Needs assistance Sitting-balance support: Bilateral upper extremity supported;Feet unsupported Sitting balance-Leahy Scale: Fair       Standing balance-Leahy Scale: Zero                               Pertinent Vitals/Pain Pain Assessment: Faces Faces Pain Scale: Hurts little more Pain Location: R residual limb Pain Descriptors / Indicators: Aching;Discomfort Pain Intervention(s): Monitored during session;Repositioned    Home Living Family/patient expects to be discharged to:: Inpatient rehab Living Arrangements: Spouse/significant other Available Help at Discharge: Family;Available 24 hours/day                  Prior Function Level of Independence: Needs assistance         Comments: Pt from CIR working on sliding board transfers, lateral scoot transfers, and anterior-posterior transfers, in addition to w/c mobility     Hand Dominance   Dominant Hand: Right    Extremity/Trunk Assessment   Upper Extremity Assessment Upper Extremity Assessment: Overall WFL for tasks assessed  Lower Extremity Assessment Lower Extremity Assessment: LLE deficits/detail;RLE deficits/detail RLE Deficits / Details: R BKA; hip and knee grossly 3/5 LLE Deficits / Details: L BKA; hip and knee grossly 3/5       Communication   Communication: No difficulties  Cognition Arousal/Alertness: Awake/alert Behavior During Therapy: WFL for tasks  assessed/performed Overall Cognitive Status: Within Functional Limits for tasks assessed                                        General Comments General comments (skin integrity, edema, etc.): Wife present during session    Exercises     Assessment/Plan    PT Assessment Patient needs continued PT services  PT Problem List Decreased strength;Decreased range of motion;Decreased activity tolerance;Decreased balance;Decreased mobility;Decreased knowledge of use of DME;Pain       PT Treatment Interventions DME instruction;Gait training;Functional mobility training;Therapeutic activities;Therapeutic exercise;Balance training;Patient/family education;Wheelchair mobility training    PT Goals (Current goals can be found in the Care Plan section)  Acute Rehab PT Goals Patient Stated Goal: Continue therapy at CIR then home PT Goal Formulation: With patient/family Time For Goal Achievement: 04/13/17 Potential to Achieve Goals: Good    Frequency Min 3X/week   Barriers to discharge        Co-evaluation               AM-PAC PT "6 Clicks" Daily Activity  Outcome Measure Difficulty turning over in bed (including adjusting bedclothes, sheets and blankets)?: None Difficulty moving from lying on back to sitting on the side of the bed? : Unable Difficulty sitting down on and standing up from a chair with arms (e.g., wheelchair, bedside commode, etc,.)?: Unable Help needed moving to and from a bed to chair (including a wheelchair)?: A Little Help needed walking in hospital room?: Total Help needed climbing 3-5 steps with a railing? : Total 6 Click Score: 11    End of Session Equipment Utilized During Treatment: Gait belt Activity Tolerance: Patient tolerated treatment well;Patient limited by pain Patient left: in chair;with call bell/phone within reach;with family/visitor present Nurse Communication: Mobility status PT Visit Diagnosis: Other abnormalities of gait and  mobility (R26.89);Muscle weakness (generalized) (M62.81)    Time: [id number] PT Time Calculation (min) (ACUTE ONLY): 25 min   Charges:   PT Evaluation $PT Eval Moderate Complexity: 1 Mod PT Treatments $Therapeutic Activity: 8-22 mins   PT G Codes:       Ina HomesJaclyn Kyon Bentler, PT, DPT Acute Rehab Services  Pager: [phone]  [name] 03/30/2017, 10:34 AM

## 2017-03-30 NOTE — Progress Notes (Signed)
PD treatment initiated without issue.  Catheter dressing changed.  Will continue to monitor.

## 2017-03-30 NOTE — Plan of Care (Signed)
  Progressing Education: Knowledge of General Education information will improve 03/30/2017 1115 - Progressing by Chaya Janunn, Lili Harts K, RN Health Behavior/Discharge Planning: Ability to manage health-related needs will improve 03/30/2017 1115 - Progressing by Chaya Janunn, Kattia Selley K, RN Clinical Measurements: Ability to maintain clinical measurements within normal limits will improve 03/30/2017 1115 - Progressing by Chaya Janunn, Greogory Cornette K, RN Will remain free from infection 03/30/2017 1115 - Progressing by Chaya Janunn, Mikael Skoda K, RN Diagnostic test results will improve 03/30/2017 1115 - Progressing by Chaya Janunn, Briseidy Spark K, RN Respiratory complications will improve 03/30/2017 1115 - Progressing by Chaya Janunn, Riyanna Crutchley K, RN Cardiovascular complication will be avoided 03/30/2017 1115 - Progressing by Chaya Janunn, Rooney Swails K, RN Activity: Risk for activity intolerance will decrease 03/30/2017 1115 - Progressing by Chaya Janunn, Anaika Santillano K, RN Nutrition: Adequate nutrition will be maintained 03/30/2017 1115 - Progressing by Chaya Janunn, Aayat Hajjar K, RN Coping: Level of anxiety will decrease 03/30/2017 1115 - Progressing by Chaya Janunn, Tadao Emig K, RN Elimination: Will not experience complications related to bowel motility 03/30/2017 1115 - Progressing by Chaya Janunn, Nery Frappier K, RN Will not experience complications related to urinary retention 03/30/2017 1115 - Progressing by Chaya Janunn, Khalon Cansler K, RN Pain Managment: General experience of comfort will improve 03/30/2017 1115 - Progressing by Chaya Janunn, Haniel Fix K, RN Skin Integrity: Risk for impaired skin integrity will decrease 03/30/2017 1115 - Progressing by Chaya Janunn, Latoyia Tecson K, RN

## 2017-03-30 NOTE — Progress Notes (Signed)
Initial Nutrition Assessment  DOCUMENTATION CODES:   Not applicable  INTERVENTION:   -Nepro Shake po q HS, each supplement provides 425 kcal and 19 grams protein -Continue 30 ml Prostat BID, each supplement provides 100 kcals and 15 grams protein -Continue renal MVI  NUTRITION DIAGNOSIS:   Increased nutrient needs related to chronic illness(ESRD on PD) as evidenced by estimated needs.  GOAL:   Patient will meet greater than or equal to 90% of their needs  MONITOR:   PO intake, Supplement acceptance, Labs, Weight trends, Skin, I & O's  REASON FOR ASSESSMENT:   Malnutrition Screening Tool    ASSESSMENT:   [name] with complicated medical history. Patient has ESRD on PD, S/P bilateral BKA, ischemic cardiomyopathy, DM, PVD and S/P AICD placement. Patient had right BKA some time in November of this year and was transferred to rehab facility but has been unstable for rehab. Patient has had significant tachycardia with low to low normal blood pressure. Patient was seen by the Cardiologist according to rehab PA and it was advised for the patient to be transferred to a telemetry unit.   Pt admitted with paroxysmal atrial tachycardia.  Pt sleeping soundly at time of visit. Wife was not at bedside, who is a very good historian.   Spoke with CIR RD, who reports pt with variable intake and does not like animal protein. Pt was receiving both Nepro and Prostat supplements at CIR. Intake remains variable on admission, noted meal completion 0-85%.   Per CIR admission notes, plan was for pt to discharge to home on 04/01/17 prior to acute hospitalization. Pt can discharge home once medically stable from hospital.   Labs reviewed: CBGS: 82-188 (inpatient orders for glycemic control are 0-9 units insulin aspart TID with meals and 25 units insulin NPO BID).  NUTRITION - FOCUSED PHYSICAL EXAM:    Most Recent Value  Orbital Region  No depletion  Upper Arm Region  No  depletion  Thoracic and Lumbar Region  No depletion  Buccal Region  No depletion  Temple Region  No depletion  Clavicle Bone Region  No depletion  Clavicle and Acromion Bone Region  No depletion  Scapular Bone Region  No depletion  Dorsal Hand  No depletion  Patellar Region  No depletion  Anterior Thigh Region  No depletion  Posterior Calf Region  No depletion  Edema (RD Assessment)  None  Hair  Reviewed  Eyes  Reviewed  Mouth  Reviewed  Skin  Reviewed  Nails  Reviewed       Diet Order:  Diet Carb Modified Fluid consistency: Thin; Room service appropriate? Yes  EDUCATION NEEDS:   No education needs have been identified at this time  Skin:  Skin Assessment: Skin Integrity Issues: Skin Integrity Issues:: Incisions Incisions: rt lower leg  Last BM:  03/29/17  Height:   Ht Readings from Last 1 Encounters:  03/12/17 5\' 9"  (1.753 m)    Weight:   Wt Readings from Last 1 Encounters:  03/30/17 152 lb 1.9 oz (69 kg)    Ideal Body Weight:  61.1 kg  BMI:  Body mass index is 22.46 kg/m.  Estimated Nutritional Needs:   Kcal:  1800-2000  Protein:  90-105 grams  Fluid:  per MD    [name], RD, LDN, CDE Pager: [phone] After hours Pager: [phone]

## 2017-03-30 NOTE — Progress Notes (Signed)
[facility] PROGRESS NOTE  [name] KitchenJames D Reis ZOX:[id number] DOB: 02/25/1941 DOA: 03/28/2017 PCP: Dorice Lamashivianathan, Susan S, MD  Interim summary and HPI [name] with complicated medical history. Patient has ESRD on PD, S/P bilateral BKA, ischemic cardiomyopathy, DM, PVD and S/P AICD placement. Patient had right BKA some time in November of this year and was transferred to rehab facility but has been unstable for rehab. Patient has had significant tachycardia with low to low normal blood pressure. Found with MAT. Cardiology consulted and recommended transfer to inpatient setting for further evaluation and treatment.  Assessment/Plan: 1-MAT -Good response to the use of amiodarone -Telemetry evaluation demonstrated regular rate and rhythm at this moment. -Plan is to continue amiodarone 200 mg twice a day and outpatient follow-up with cardiology service. -LFTs and QT within normal limits.  2-end-stage renal disease on PD -renal service on board and will follow their recommendations regarding PD   3-status post right BKA -Wound care has been consulted to continue care after amputation -Given findings on his wound and complaints of pain orthopedic doctor ([name]) has been re-consulted. 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  Will resume novolin  5-HTN -Soft but stable -Continue carvedilol at current dose.  6-ischemic cardiomyopathy  -EF 30% -no CP and denies SOB -will continue carvedilol at current dose and continue ASA/plavix  -continue volume management with HD -Given patient's low blood pressure unable to further adjust medications. -Cardiology will follow him up in 1-2 weeks after discharge.  7-hypokalemia -Continue daily supplementation and intermittent follow-up of his electrolytes.    -Further adjustment to be done as needed with his peritoneal dialysis treatment.  8-HLD -Continue statins.     Code Status: Full code Family Communication: Wife at bedside Disposition Plan: To be determined.  Either home with home health services versus return home to inpatient rehab once medically stable prior to home discharge.   Consultants:  Renal service  Orthopedic service  Cardiology service  Procedures:  None   Antibiotics:  None   HPI/Subjective: No chest pain, no shortness of breath, no palpitation.  Patient is afebrile and otherwise feeling good.  No nausea, no vomiting.  Telemetry demonstrating sinus rhythm with rate controlled.  Objective: Vitals:   03/30/17 0532 03/30/17 1240  BP: (!) 133/53 (!) 125/52  Pulse: 75 80  Resp: 18 18  Temp: 98 F (36.7 C) 98.4 F (36.9 C)  SpO2: 100% 100%    Intake/Output Summary (Last 24 hours) at 03/30/2017 1739 Last data filed at 03/30/2017 1112 Gross per 24 hour  Intake [id number] ml  Output 15356 ml  Net 34 ml   Filed Weights   03/29/17 0859 03/29/17 1826 03/30/17 0532  Weight: 70 kg (154 lb 5.2 oz) 67 kg (147 lb 11.3 oz) 69 kg (152 lb 1.9 oz)    Exam:   General: Afebrile, no chest pain, no shortness of breath.  Patient denies palpitation and is overall feeling better.  Telemetry evaluation has demonstrated normal sinus rhythm with controlled rate.    Cardiovascular: RRR, S1 and S2; no rubs, no gallops, no JVD.   Respiratory: Good air movement bilaterally, no wheezing, no crackles.   Abdomen: Soft, nontender, nondistended, positive bowel sounds.    Musculoskeletal: Bilateral BKA, right stump with adhered eschar without drainage or odor, but with significant pain on palpation. Left stump with some scabs but no signs  of super imposed infection.  There is no swelling on extremities.  Data Reviewed: Basic Metabolic Panel: Recent Labs  Lab 03/24/17 0525 03/25/17 0951 03/26/17 0630 03/28/17 0443  03/28/17 1538 03/29/17 0455  NA 131* 128* 132* 132*  --  133*  K 3.3* 4.1 4.0 4.3  --  4.4  CL 90* 91* 96* 97*  --  96*  CO2 29 28 26 27   --  25  GLUCOSE 303* 289* 143* 191*  --  159*  BUN 54* 53* 50* 50*  --  46*  CREATININE 4.77* 4.50* 4.30* 4.16* 4.30* 4.07*  CALCIUM 8.7* 8.0* 8.3* 8.5*  --  8.8*  PHOS  --  3.5  --  3.2  --   --    Liver Function Tests: Recent Labs  Lab 03/25/17 0951 03/28/17 0443 03/29/17 0455  AST  --   --  29  ALT  --   --  25  ALKPHOS  --   --  100  BILITOT  --   --  0.5  PROT  --   --  5.5*  ALBUMIN 1.6* 1.7* 1.7*   CBC: Recent Labs  Lab 03/24/17 0525 03/26/17 0630 03/28/17 0443 03/28/17 1538 03/29/17 0455  WBC 8.2 10.1 7.6 10.2 7.1  HGB 10.2* 9.5* 10.1* 9.7* 10.5*  HCT 32.9* 30.8* 32.8* 32.0* 33.9*  MCV 95.1 94.8 95.6 97.6 96.9  PLT 486* 422* 451* 463* 445*   CBG: Recent Labs  Lab 03/29/17 1634 03/29/17 2121 03/30/17 0743 03/30/17 1228 03/30/17 1656  GLUCAP 82 148* 188* 195* 93   Studies: No results found.  Scheduled Meds: . amiodarone  200 mg Oral BID  . aspirin EC  81 mg Oral Daily  . atorvastatin  80 mg Oral q1800  . carvedilol  6.25 mg Oral BID WC  . clopidogrel  75 mg Oral Daily  . [START ON 04/01/2017] Darbepoetin Alfa  200 mcg Subcutaneous Q Sat-1800  . docusate sodium  100 mg Oral BID  . feeding supplement (NEPRO CARB STEADY)  237 mL Oral QHS  . feeding supplement (PRO-STAT SUGAR FREE 64)  30 mL Oral BID  . gentamicin cream  1 application Topical Daily  . heparin  5,000 Units Subcutaneous Q8H  . insulin aspart  0-9 Units Subcutaneous TID WC  . insulin NPH Human  25 Units Subcutaneous BID AC & HS  . midodrine  2.5 mg Oral BID WC  . multivitamin  1 tablet Oral QHS  . polyethylene glycol  17 g Oral Daily  . potassium chloride SA  30 mEq Oral Daily    Time spent: 25 minutes   Vassie Lollarlos Sameul Tagle  [facility] Pager [phone]. If 7PM-7AM, please contact night-coverage at [email or web address], password [email or web address] 03/30/2017,  5:39 PM  LOS: 2 days

## 2017-03-30 NOTE — Progress Notes (Signed)
Admit: 03/28/2017 LOS: 2  89M ESRD on PD in [place]  Subjective:  No PD issues overnight, low UF  Pt/wife hopeful for CIR PD cath working well Dry days on PD now  12/19 0701 - 12/20 0700 In: [id number] [P.O.:480] Out: 13161 [Urine:1]  Filed Weights   03/29/17 0859 03/29/17 1826 03/30/17 0532  Weight: 70 kg (154 lb 5.2 oz) 67 kg (147 lb 11.3 oz) 69 kg (152 lb 1.9 oz)    Scheduled Meds: . amiodarone  200 mg Oral BID  . aspirin EC  81 mg Oral Daily  . atorvastatin  80 mg Oral q1800  . carvedilol  6.25 mg Oral BID WC  . clopidogrel  75 mg Oral Daily  . [START ON 04/01/2017] Darbepoetin Alfa  200 mcg Subcutaneous Q Sat-1800  . docusate sodium  100 mg Oral BID  . feeding supplement (PRO-STAT SUGAR FREE 64)  30 mL Oral BID  . gentamicin cream  1 application Topical Daily  . heparin  5,000 Units Subcutaneous Q8H  . insulin aspart  0-9 Units Subcutaneous TID WC  . insulin NPH Human  25 Units Subcutaneous BID AC & HS  . midodrine  2.5 mg Oral BID WC  . multivitamin  1 tablet Oral QHS  . polyethylene glycol  17 g Oral Daily  . potassium chloride SA  30 mEq Oral Daily   Continuous Infusions: PRN Meds:.acetaminophen, dianeal solution for CAPD/CCPD with heparin, oxyCODONE  Current Labs: reviwed    Physical Exam:  Blood pressure (!) 133/53, pulse 75, temperature 98 F (36.7 C), temperature source Axillary, resp. rate 18, weight 69 kg (152 lb 1.9 oz), SpO2 100 %. NAD RRR nl s1s2 no rub CTAB NO edema in stumps AAO x3, nonfocal S/nt/nd  A 1. ESRD on PD: CCPD 2.5L of 2.5% x5, then 2L last fill draining mid day; Dr. Terrilyn SaverBabeja; will need new EDW 2. Wandering Pacemaker / MAT, on amio, CV following 3. ASCVD hx/o LHC and PCI 01/2017; hx/o CABG; has ICM with sCHF 4. S/p R BKA and recent L BKA revision 5. DM2 6. HTN on carvedilol 7. Anemia on 200mcg Aranesp qSat 8. Hyopkalemia on KCl 9. CKD BMD, P, PTH, Ca all at gaol  P 1. Cont PD with current regimen   Sabra Heckyan Gerod Caligiuri  MD 03/30/2017, 9:47 AM  Recent Labs  Lab 03/25/17 0951 03/26/17 0630 03/28/17 0443 03/28/17 1538 03/29/17 0455  NA 128* 132* 132*  --  133*  K 4.1 4.0 4.3  --  4.4  CL 91* 96* 97*  --  96*  CO2 28 26 27   --  25  GLUCOSE 289* 143* 191*  --  159*  BUN 53* 50* 50*  --  46*  CREATININE 4.50* 4.30* 4.16* 4.30* 4.07*  CALCIUM 8.0* 8.3* 8.5*  --  8.8*  PHOS 3.5  --  3.2  --   --    Recent Labs  Lab 03/28/17 0443 03/28/17 1538 03/29/17 0455  WBC 7.6 10.2 7.1  HGB 10.1* 9.7* 10.5*  HCT 32.8* 32.0* 33.9*  MCV 95.6 97.6 96.9  PLT 451* 463* 445*

## 2017-03-31 ENCOUNTER — Inpatient Hospital Stay (HOSPITAL_COMMUNITY): Payer: Medicare Other

## 2017-03-31 DIAGNOSIS — I5022 Chronic systolic (congestive) heart failure: Secondary | ICD-10-CM

## 2017-03-31 LAB — GLUCOSE, CAPILLARY
Glucose-Capillary: 110 mg/dL — ABNORMAL HIGH (ref 65–99)
Glucose-Capillary: 253 mg/dL — ABNORMAL HIGH (ref 65–99)

## 2017-03-31 MED ORDER — HEPARIN SODIUM (PORCINE) 1000 UNIT/ML IJ SOLN
5000.0000 [IU] | Freq: Once | INTRAMUSCULAR | Status: DC
  Administered 2017-03-31: 5000 [IU] via INTRAVENOUS

## 2017-03-31 MED ORDER — OXYCODONE HCL 5 MG PO TABS: 5.0000 mg | ORAL_TABLET | Freq: Four times a day (QID) | ORAL | 0 refills | Status: DC | PRN

## 2017-03-31 MED ORDER — DELFLEX-LC/1.5% DEXTROSE 344 MOSM/L IP SOLN
INTRAPERITONEAL | Status: DC | PRN
  Filled 2017-03-31 (×5): qty 5000

## 2017-03-31 MED ORDER — NEPRO/CARBSTEADY PO LIQD: 237.0000 mL | Freq: Every day | ORAL | 3 refills | Status: AC

## 2017-03-31 MED ORDER — HEPARIN 1000 UNIT/ML FOR PERITONEAL DIALYSIS: INTRAPERITONEAL | Status: DC | PRN

## 2017-03-31 MED ORDER — INSULIN NPH (HUMAN) (ISOPHANE) 100 UNIT/ML ~~LOC~~ SUSP: 30.0000 [IU] | Freq: Two times a day (BID) | SUBCUTANEOUS | Status: AC

## 2017-03-31 MED ORDER — AMIODARONE HCL 200 MG PO TABS: 200.0000 mg | ORAL_TABLET | Freq: Two times a day (BID) | ORAL | 1 refills | Status: AC

## 2017-03-31 MED ORDER — HEPARIN 1000 UNIT/ML FOR PERITONEAL DIALYSIS
5000.0000 [IU] | INTRAMUSCULAR | Status: DC | PRN
  Filled 2017-03-31: qty 5

## 2017-03-31 NOTE — Progress Notes (Signed)
Admit: 03/28/2017 LOS: 3  49M ESRD on PD in [place]  Subjective:  Lines clotted overnight, added heparin but not able to restart = partial PD Tentative to go home today or tomorrow  Inpatiient PD: CCPD 2.5L x6, using 1.5% mostly  12/20 0701 - 12/21 0700 In: [id number] [P.O.:150] Out: 19955   Filed Weights   03/30/17 1845 03/31/17 0335 03/31/17 [id number]  Weight: 67 kg (147 lb 11.3 oz) 68 kg (149 lb 14.6 oz) 69 kg (152 lb 1.9 oz)    Scheduled Meds: . amiodarone  200 mg Oral BID  . aspirin EC  81 mg Oral Daily  . atorvastatin  80 mg Oral q1800  . carvedilol  6.25 mg Oral BID WC  . clopidogrel  75 mg Oral Daily  . [START ON 04/01/2017] Darbepoetin Alfa  200 mcg Subcutaneous Q Sat-1800  . docusate sodium  100 mg Oral BID  . feeding supplement (NEPRO CARB STEADY)  237 mL Oral QHS  . feeding supplement (PRO-STAT SUGAR FREE 64)  30 mL Oral BID  . gentamicin cream  1 application Topical Daily  . heparin  5,000 Units Subcutaneous Q8H  . insulin aspart  0-9 Units Subcutaneous TID WC  . insulin NPH Human  25 Units Subcutaneous BID AC & HS  . midodrine  2.5 mg Oral BID WC  . multivitamin  1 tablet Oral QHS  . polyethylene glycol  17 g Oral BID  . potassium chloride SA  30 mEq Oral Daily   Continuous Infusions: PRN Meds:.acetaminophen, heparin, oxyCODONE  Current Labs: reviwed    Physical Exam:  Blood pressure 125/69, pulse 90, temperature 98.4 F (36.9 C), temperature source Oral, resp. rate 18, weight 69 kg (152 lb 1.9 oz), SpO2 98 %. NAD RRR nl s1s2 no rub CTAB NO edema in stumps AAO x3, nonfocal S/nt/nd  A 1. ESRD on PD: Home CCPD 2.5L of 2.5% x5, then 2L last fill draining mid day; Dr. Terrilyn SaverBabeja; will need new EDW 2. Wandering Pacemaker / MAT, on amio, CV following 3. ASCVD hx/o LHC and PCI 01/2017; hx/o CABG; has ICM with sCHF 4. S/p R BKA and recent L BKA revision; Duda follows 5. DM2 6. HTN on carvedilol 7. Anemia on 200mcg Aranesp qSat 8. Hyopkalemia on KCl 9. CKD BMD,  P, PTH, Ca all at gaol  P 1. Cont PD with current regimen 2. IF DC planned for today would attempt manual exchange during day to see if in/outflow is ok 3. Add heparin to all bags tonight 4. AXR to eval stool burden and PD cath placement   [facility] Victorio Creeden MD 03/31/2017, 9:54 AM  Recent Labs  Lab 03/25/17 0951 03/26/17 0630 03/28/17 0443 03/28/17 1538 03/29/17 0455  NA 128* 132* 132*  --  133*  K 4.1 4.0 4.3  --  4.4  CL 91* 96* 97*  --  96*  CO2 28 26 27   --  25  GLUCOSE 289* 143* 191*  --  159*  BUN 53* 50* 50*  --  46*  CREATININE 4.50* 4.30* 4.16* 4.30* 4.07*  CALCIUM 8.0* 8.3* 8.5*  --  8.8*  PHOS 3.5  --  3.2  --   --    Recent Labs  Lab 03/28/17 0443 03/28/17 1538 03/29/17 0455  WBC 7.6 10.2 7.1  HGB 10.1* 9.7* 10.5*  HCT 32.8* 32.0* 33.9*  MCV 95.6 97.6 96.9  PLT 451* 463* 445*

## 2017-03-31 NOTE — Progress Notes (Signed)
Inpatient rehab admissions - I received a call from attending MD yesterday requesting us to reconsider inpatient rehab re-admission.  I spoke with Dr. Allena KatzPatel, rehab MD, and he reviewed all clinical notes.  Dr. Allena KatzPatel does not feel that we need to re-admit.  [facility] from  Rehab MD perspective.  Call me for questions.  #[phone]

## 2017-03-31 NOTE — Discharge Summary (Signed)
Physician Discharge Summary  [name]:[id number] DOB: 1940/09/11 DOA: 03/28/2017  PCP: Dorice [name], MD  Admit date: 03/28/2017 Discharge date: 03/31/2017  Time spent: 35 minutes  Recommendations for Outpatient Follow-up:  1. Repeat CBC to follow Hgb trend  2. Repeat CMET to follow electrolytes and LFT's 3. Reassess CBG's and further hypoglycemic regimen as needed  4. Please evaluate need for outpatient gastroenterology evaluation (given intermittent episodes of BRBPR).   Discharge Diagnoses:  Active Problems:   ESRD (end stage renal disease) (HCC)   Atrial tachycardia (HCC)   DCM (dilated cardiomyopathy) (HCC)   Atrial fibrillation with RVR (HCC)   Chronic systolic HF (heart failure) (HCC)   Discharge Condition: stable and improved. Discharge home with [facility] services and instructed to follow up with cardiology, orthopedic service and PCP as an outpatient.   Diet recommendation: heart healthy diet   Filed Weights   03/30/17 1845 03/31/17 0335 03/31/17 9562  Weight: 67 kg (147 lb 11.3 oz) 68 kg (149 lb 14.6 oz) 69 kg (152 lb 1.9 oz)    History of present illness:  [name] with complicated medical history. Patient has ESRD on PD, S/P bilateral BKA, ischemic cardiomyopathy, DM, PVD and S/P AICD placement. Patient had right BKA some time in November of this year and was transferred to rehab facility but has been unstable for rehab. Patient has had significant tachycardia with low to low normal blood pressure. Found with MAT. Cardiology consulted and recommended transfer to inpatient setting for further evaluation and treatment.   Hospital Course:  1-MAT -Good response to the use of amiodarone -Telemetry evaluation demonstrated regular rate and rhythm at time of discharge. -Plan is to continue amiodarone 200 mg twice a day and outpatient follow-up with cardiology service. -LFTs and QT within normal limits.  2-end-stage renal disease on  PD -renal service was on board and plan is to continue PD  3-status post right BKA -Wound care has been consulted to continue care after amputation -Given findings on his wound and complaints of pain orthopedic doctor (Dr. Lajoyce Corners) was been re-consulted. -especial instructions for wound care given and follow up in 1 week as an outpatient scheduled.   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  6-ischemic cardiomyopathy  -EF 30% -no CP and denies SOB -continue carvedilol at current dose and continue ASA/plavix  -continue volume management with HD -Given patient's low blood pressure unable to further adjust medications as per cardiology inputs. -Cardiology will follow him up in 1-2 weeks after discharge.  7-hypokalemia -Continue daily supplementation and intermittent follow-up of his electrolytes.    8-HLD -Continue statins.     9-bright blood per rectum -Most likely associated with internal hemorrhoids -Patient was actively receiving heparin and is chronically on Plavix at home -No further heparin at the moment of discharge -He will follow-up with his PCP as an outpatient  Procedures:  None  Consultations:  Orthopedic service  Cardiology  Renal service  Discharge Exam: Vitals:   03/31/17 0846 03/31/17 1209  BP: 125/69 (!) 126/45  Pulse: 90 86  Resp: 18 20  Temp:  (!) 97.4 F (36.3 C)  SpO2:  98%    General: Afebrile, no chest pain, no shortness  of breath.  Patient denies palpitation and is overall feeling better.  Had a mild isolated episode of vomiting (which is something that he experienced time to time). Otherwise in no distress and eager to go  home.   Cardiovascular: RRR, S1 and S2; no rubs, no gallops, no JVD.   Respiratory: Good air movement bilaterally, no wheezing, no crackles.   Abdomen: Soft, nontender, nondistended, positive bowel sounds.    Musculoskeletal: Bilateral BKA, right stump with adhered eschar without drainage or odor, but with significant pain on palpation. Left stump with some scabs but no signs of super imposed infection.  There is no swelling on extremities.  Discharge Instructions   Discharge Instructions    Discharge instructions   Complete by:  As directed    -Continue peritoneal dialysis as previously scheduled -Maintain adequate hydration -Follow-up with orthopedic service in 1 week -Arrange follow-up with primary care doctor in 2 weeks -Follow heart healthy and modified carbohydrates diet -Follow wound care instructions as dictated by orthopedic service     Allergies as of 03/31/2017      Reactions   Sulfa Antibiotics Hives   Vancomycin Other (See Comments)   Blisters   Zanaflex [tizanidine Hcl] Other (See Comments)   Hypotensive stroke   Gabapentin (once-daily) Hypertension   Lisinopril Hives   Methotrexate Derivatives Other (See Comments)   blisters   Tape Hives   Flexeril [cyclobenzaprine] Other (See Comments)   somnolence      Medication List    TAKE these medications   amiodarone 200 MG tablet Commonly known as:  PACERONE Take 1 tablet (200 mg total) by mouth 2 (two) times daily.   aspirin EC 81 MG tablet Take 81 mg by mouth daily.   atorvastatin 80 MG tablet Commonly known as:  LIPITOR Take 1 tablet (80 mg total) by mouth daily at 6 PM.   calcium acetate 667 MG capsule Commonly known as:  PHOSLO Take 1 capsule (667 mg total) by mouth 3 (three) times daily with meals.   carvedilol 6.25 MG tablet Commonly known as:  COREG Take 1 tablet (6.25 mg total) by mouth 2 (two) times daily with a meal.   clopidogrel 75 MG tablet Commonly known as:  PLAVIX Take 1 tablet  (75 mg total) by mouth daily.   Darbepoetin Alfa 200 MCG/0.4ML Sosy injection Commonly known as:  ARANESP Inject 0.4 mLs (200 mcg total) into the skin every Saturday at 6 PM. Start taking on:  04/01/2017   docusate sodium 100 MG capsule Commonly known as:  COLACE Take 100 mg by mouth 2 (two) times daily.   feeding supplement (NEPRO CARB STEADY) Liqd Take 237 mLs by mouth at bedtime.   feeding supplement (PRO-STAT SUGAR FREE 64) Liqd Take 30 mLs by mouth 2 (two) times daily.   insulin aspart 100 UNIT/ML injection Commonly known as:  novoLOG Inject 0-8 Units into the skin 3 (three) times daily with meals.   insulin NPH Human 100 UNIT/ML injection Commonly known as:  HUMULIN N,NOVOLIN N Inject 0.3 mLs (30 Units total) into the skin 2 (two) times daily before a meal. What changed:    how much to take  when to take this  Another medication with the same name was removed. Continue taking this medication, and follow the directions you see here.   midodrine 2.5 MG tablet Commonly known as:  PROAMATINE Take 1 tablet (2.5 mg total) by mouth 2 (two) times daily with breakfast and lunch.   multivitamin Tabs tablet Take  1 tablet by mouth at bedtime.   nitroGLYCERIN 0.4 MG SL tablet Commonly known as:  NITROSTAT Place 0.4 mg under the tongue every 5 (five) minutes as needed for chest pain.   oxyCODONE 5 MG immediate release tablet Commonly known as:  Oxy IR/ROXICODONE Take 1 tablet (5 mg total) by mouth every 6 (six) hours as needed for severe pain.   polyethylene glycol packet Commonly known as:  MIRALAX / GLYCOLAX Take 17 g by mouth daily.   potassium chloride SA 15 MEQ tablet Commonly known as:  KLOR-CON M15 Take 2 tablets (30 mEq total) by mouth daily.   Vitamin D (Ergocalciferol) 50000 units Caps capsule Commonly known as:  DRISDOL Take 50,000 Units by mouth every 30 (thirty) days.      Allergies  Allergen Reactions  . Sulfa Antibiotics Hives  . Vancomycin Other  (See Comments)    Blisters   . Zanaflex [Tizanidine Hcl] Other (See Comments)    Hypotensive stroke  . Gabapentin (Once-Daily) Hypertension  . Lisinopril Hives  . Methotrexate Derivatives Other (See Comments)    blisters  . Tape Hives  . Flexeril [Cyclobenzaprine] Other (See Comments)    somnolence   Follow-up Information    Jake Bathe, MD Follow up on 04/12/2017.   Specialty:  Cardiology Why:  at 2:00Pm with his NP Nada Boozer  Contact information: [address] [place] [address] [phone]        Nadara Mustard, MD Follow up in 1 week(s).   Specialty:  Orthopedic Surgery Contact information: [address] [place] [address] [phone]        Dorice [name], MD. Schedule an appointment as soon as possible for a visit in 2 week(s).   Specialty:  Family Medicine Contact information: [address] [place] [address] [phone]           The results of significant diagnostics from this hospitalization (including imaging, microbiology, ancillary and laboratory) are listed below for reference.    Significant Diagnostic Studies: Dg Chest Port 1 View  Result Date: 03/12/2017 CLINICAL DATA:  Shortness of breath. EXAM: PORTABLE CHEST 1 VIEW COMPARISON:  03/05/2017 FINDINGS: 1328 hours. Low lung volumes. The cardio pericardial silhouette is enlarged. There is pulmonary vascular congestion without overt pulmonary edema. Left defibrillator again noted. Patient is status post CABG. The visualized bony structures of the thorax are intact. IMPRESSION: Cardiomegaly with vascular congestion. Electronically Signed   By: Kennith Center M.D.   On: 03/12/2017 13:58   Dg Chest Port 1 View  Result Date: 03/05/2017 CLINICAL DATA:  Sepsis EXAM: PORTABLE CHEST 1 VIEW COMPARISON:  01/12/2017 FINDINGS: Postoperative changes in the mediastinum. Cardiac pacemaker with lead tip along the left chest, unchanged in position.  Cardiac enlargement. No vascular congestion or edema. No blunting of costophrenic angles. No pneumothorax. No focal consolidation. Calcification of the aorta. IMPRESSION: Cardiac enlargement.  No evidence of active pulmonary disease. Electronically Signed   By: Burman Nieves M.D.   On: 03/05/2017 01:16   Labs: Basic Metabolic Panel: Recent Labs  Lab 03/25/17 0951 03/26/17 0630 03/28/17 0443 03/28/17 1538 03/29/17 0455  NA 128* 132* 132*  --  133*  K 4.1 4.0 4.3  --  4.4  CL 91* 96* 97*  --  96*  CO2 28 26 27   --  25  GLUCOSE 289* 143* 191*  --  159*  BUN 53* 50* 50*  --  46*  CREATININE 4.50* 4.30* 4.16* 4.30*  4.07*  CALCIUM 8.0* 8.3* 8.5*  --  8.8*  PHOS 3.5  --  3.2  --   --    Liver Function Tests: Recent Labs  Lab 03/25/17 0951 03/28/17 0443 03/29/17 0455  AST  --   --  29  ALT  --   --  25  ALKPHOS  --   --  100  BILITOT  --   --  0.5  PROT  --   --  5.5*  ALBUMIN 1.6* 1.7* 1.7*   CBC: Recent Labs  Lab 03/26/17 0630 03/28/17 0443 03/28/17 1538 03/29/17 0455  WBC 10.1 7.6 10.2 7.1  HGB 9.5* 10.1* 9.7* 10.5*  HCT 30.8* 32.8* 32.0* 33.9*  MCV 94.8 95.6 97.6 96.9  PLT 422* 451* 463* 445*    CBG: Recent Labs  Lab 03/30/17 1228 03/30/17 1656 03/30/17 2047 03/31/17 0745 03/31/17 1117  GLUCAP 195* 93 156* 110* 253*    Signed:  Vassie Lollarlos Oluwaseyi Raffel MD.  [facility] 03/31/2017, 2:23 PM

## 2017-03-31 NOTE — Progress Notes (Signed)
Seen pt in room, alert/oriented in no acute distress. Check lines d/t c/o pt line blocked in the screen monitor of cycle machine. Fills 2 done and drain and a new cycler machine set up for the remaining 4 fills of peritoneal dialysis. And noted the same pt line blocked in the screen monitor on call made aware of situation above and received orders to give heparin 5000units for each bag.

## 2017-03-31 NOTE — Progress Notes (Signed)
Pt receiving PD, machine began to beep and show warning sign that line was blocked.  This Passenger transport managerwriter and charge nurse contacted rapid response and AC for help with correcting problem.  This Clinical research associatewriter called Hemodialysis for a technician no response.  AC provided 1800# located on machine with no results.  On call hemodialysis tech contacted trouble shooting unsuccessful.  Hemodialysis tech stated he would call his supervisor to see if it was ok for him to come and check patient.

## 2017-03-31 NOTE — Progress Notes (Signed)
Patient ID: Joshua [name], male   DOB: 02-Apr-1941, 76 y.o.   MRN: [id number] Patient is status post bilateral transtibial amputations.  Examination patient has a well consolidated healing left transtibial amputation.  Examination of the right transtibial amputation patient does have some ischemic changes to the skin around the wound edges.  There is some mild amount of drainage there is good hair growth down to the amputation site.  Patient is having ischemic pain.  Will have him continue dry dressing changes washing with soap and water daily I will follow-up in the office in 1 week.  Discussed that if this is not improving that we would need to consider an above-the-knee amputation.

## 2017-03-31 NOTE — Progress Notes (Signed)
Hemodialysis tech at bedside.

## 2017-03-31 NOTE — Care Management Important Message (Signed)
Important Message  Patient Details  Name: Joshua [name] MRN: [id number] Date of Birth: 12-04-1940   Medicare Important Message Given:  Yes    [name] 03/31/2017, 1:40 PM

## 2017-03-31 NOTE — Care Management Note (Signed)
Case Management Note  Patient Details  Name: Joshua [name] MRN: [id number] Date of Birth: 11-13-1940  Subjective/Objective:     Atrial Tachy             Action/Plan: Patient lives at home with spouse; PCP: Dhivianathan, Birdie HopesSusan S, MD; has private insurance with Medicare/ BCBS with prescription drug coverage; pharmacy of choice is [facility]; patient reports no problem getting his medication; HHC choice offered, pt chose [facility] - they [facility] their [facility]; ( tele # [phone] / fax # [phone]); patient stated that he does not need any DME at this time, he has a wheelchair at his home. CM will continue to follow for progression of care.  Expected Discharge Date:   possibly 03/31/2017               Expected Discharge Plan:  Home w Home Health Services  Discharge planning Services  CM Consult  Choice offered to:  Patient, Spouse  HH Arranged:  RN, Nurse's Aide, PT [facility]  Status of Service:  In process, will continue to follow  Reola MosherChandler, Kristyana Notte L, RN,MHA,BSN [phone] 03/31/2017, 11:18 AM

## 2017-04-06 NOTE — Telephone Encounter (Signed)
Patient contacted on 04/06/2017 @ 11:34am Patient understands to follow up with  Provider on 04/12/2017 @ 2:00pm with Nada BoozerLaura Ingold, [address] office location provided to patient. Patient understands discharge instructions? yes Patient understands medications and regimen? yes Patient understands to bring all medications to this visit? Yes  Spoke with husband and wife, patient states he is feeling well and they appreciate call.

## 2017-04-07 ENCOUNTER — Telehealth: Payer: Self-pay | Admitting: Cardiology

## 2017-04-07 MED ORDER — CARVEDILOL 6.25 MG PO TABS: 6.2500 mg | ORAL_TABLET | Freq: Two times a day (BID) | ORAL | 0 refills | Status: AC

## 2017-04-07 MED ORDER — MIDODRINE HCL 2.5 MG PO TABS: 2.5000 mg | ORAL_TABLET | Freq: Two times a day (BID) | ORAL | 0 refills | Status: DC

## 2017-04-07 NOTE — Telephone Encounter (Signed)
New message     *STAT* If patient is at the pharmacy, call can be transferred to refill team.   1. Which medications need to be refilled? (please list name of each medication and dose if known) carvedilol (COREG) 6.25 MG tablet and midodrine (PROAMATINE) 2.5 MG tablet  2. Which pharmacy/location (including street and city if local pharmacy) is medication to be sent to? [facility] [name]  3. Do they need a 30 day or 90 day supply? 30

## 2017-04-07 NOTE — Telephone Encounter (Signed)
Pt's medication was sent to pt's pharmacy as requested. Confirmation received.  °

## 2017-04-11 DIAGNOSIS — R195 Other fecal abnormalities: Secondary | ICD-10-CM

## 2017-04-11 HISTORY — DX: Other fecal abnormalities: R19.5

## 2017-04-12 ENCOUNTER — Encounter (INDEPENDENT_AMBULATORY_CARE_PROVIDER_SITE_OTHER): Payer: Self-pay | Admitting: Family

## 2017-04-12 ENCOUNTER — Encounter: Payer: Self-pay | Admitting: Cardiology

## 2017-04-12 ENCOUNTER — Ambulatory Visit (INDEPENDENT_AMBULATORY_CARE_PROVIDER_SITE_OTHER): Payer: Medicare Other | Admitting: Family

## 2017-04-12 ENCOUNTER — Ambulatory Visit (INDEPENDENT_AMBULATORY_CARE_PROVIDER_SITE_OTHER): Payer: Medicare Other | Admitting: Cardiology

## 2017-04-12 VITALS — BP 92/42 | HR 70 | Ht 69.0 in

## 2017-04-12 DIAGNOSIS — I739 Peripheral vascular disease, unspecified: Secondary | ICD-10-CM | POA: Diagnosis not present

## 2017-04-12 DIAGNOSIS — I959 Hypotension, unspecified: Secondary | ICD-10-CM | POA: Diagnosis not present

## 2017-04-12 DIAGNOSIS — Z79899 Other long term (current) drug therapy: Secondary | ICD-10-CM

## 2017-04-12 DIAGNOSIS — I471 Supraventricular tachycardia: Secondary | ICD-10-CM | POA: Diagnosis not present

## 2017-04-12 DIAGNOSIS — I5041 Acute combined systolic (congestive) and diastolic (congestive) heart failure: Secondary | ICD-10-CM

## 2017-04-12 DIAGNOSIS — I2510 Atherosclerotic heart disease of native coronary artery without angina pectoris: Secondary | ICD-10-CM | POA: Diagnosis not present

## 2017-04-12 DIAGNOSIS — N186 End stage renal disease: Secondary | ICD-10-CM | POA: Diagnosis not present

## 2017-04-12 DIAGNOSIS — Z992 Dependence on renal dialysis: Secondary | ICD-10-CM

## 2017-04-12 DIAGNOSIS — Z89512 Acquired absence of left leg below knee: Secondary | ICD-10-CM

## 2017-04-12 DIAGNOSIS — Z89511 Acquired absence of right leg below knee: Secondary | ICD-10-CM

## 2017-04-12 DIAGNOSIS — T8130XA Disruption of wound, unspecified, initial encounter: Secondary | ICD-10-CM

## 2017-04-12 DIAGNOSIS — I429 Cardiomyopathy, unspecified: Secondary | ICD-10-CM | POA: Diagnosis not present

## 2017-04-12 MED ORDER — CLINDAMYCIN HCL 150 MG PO CAPS: 150.0000 mg | ORAL_CAPSULE | Freq: Three times a day (TID) | ORAL | 0 refills | Status: DC

## 2017-04-12 MED ORDER — SILVER SULFADIAZINE 1 % EX CREA: 1.0000 "application " | TOPICAL_CREAM | Freq: Every day | CUTANEOUS | 0 refills | Status: DC

## 2017-04-12 NOTE — Patient Instructions (Addendum)
Medication Instructions:  1. CONTINUE ON AMIODARONE 200 MG TWICE DAILY ; BEGINNING 04/27/17 YOU WILL CHANGE AMIODARONE TO 200 MG ONCE A DAY  2. PER LAURA INGOLD, NP YOU WILL NEED TO FOLLOW UP WITH YOUR NEPHROLOGIST ABOUT MIDODRINE  Labwork: 1. TODAY CMET, TSH  Testing/Procedures: NONE ORDERED TODAY  Follow-Up: YOU WILL NEED TO FOLLOW UP WITH DR. Teofilo PodZAGOL  WE WILL SEE YOU IN OUR OFFICE AS NEEDED  Any Other Special Instructions Will Be Listed Below (If Applicable).     If you need a refill on your cardiac medications before your next appointment, please call your pharmacy.

## 2017-04-12 NOTE — Progress Notes (Signed)
Cardiology Office Note   Date:  04/12/2017   ID:  [name], DOB 08/08/1940, MRN [id number]  PCP:  Dorice [name], MD  Cardiologist:  Dr. Allyson Sabal   Primary cardiologist:  Dr. Teofilo Pod ([place]) and Dr. Wilford Grist ([facility])   Chief Complaint  Patient presents with  . Hospitalization Follow-up      History of Present Illness: [name] is a 77 y.o. male who presents for post hospitalization for atrial tach.    He has a hx of coronary artery disease, prior stent to RCA in 07/2016 in the setting of non-STEMI, ischemic cardiomyopathy with EF 35%,  status post [employer] ICD placement in August 2018 at [facility] with hypertension hyperlipidemia end-stage renal disease on peritoneal dialysis and severe peripheral arterial disease with amputations and with recent hospitalization possible diagnosis of either wandering atrial pacemaker or atrial fibrillation which by EKG and tele c/w atrial tachycardia.  No a fib only atrial tach and pt placed on amiodarone due to hypotension.  No anticoagulation needed.  Last cath 01/2017 with patent proximal RCA stent, a patent LIMA to the LAD. He has a patent sequential graft to 2 obtuse marginal branches. The first anastomosis has about a 60- 75% stenosis at the anastomosis.. The vein graft crossing that obtuse marginal branch has about 50-60% stenosis. There are no critical stenoses lesions identified. Continue medical therapy   Today his BP has been labile, 169/69 this AM now 92/42 - in the hospital he was discharged on proamitine but never given script - BP in the hospital was overall lower.  His R BKA is followed by Dr. Lajoyce Corners and today has necrotic tissue and needs revision, was started on ABX today.  He is to see them back in a week.  He has no chest pain and some SOB with exertion, but he admits to not doing much with his amputations- bil.  He has not had any awareness of rapid HR and he did with hospitalization.     His plan is to return to  his primary cardiologist Dr. Teofilo Pod in [place]- this is much easier on the pt.   His nephrologist is in [place] as well.          Past Medical History:  Diagnosis Date  . AICD (automatic cardioverter/defibrillator) present    [employer] [phone] lead [id number]  . Arthritis   . CHF (congestive heart failure) (HCC)   . Complication of anesthesia   . COPD (chronic obstructive pulmonary disease) (HCC)   . Coronary artery disease    CABG 2011  . Diabetes mellitus without complication (HCC)   . ESRD on peritoneal dialysis [facility])    Started PD Dec 2016 in [place]. Nephrology is in [place].   . Foot infection 01/06/2017  . Gangrene (HCC)    left knee  . Gangrene of left foot (HCC) 11/26/2016  . Gangrene of right foot (HCC) 02/23/2017  . GERD (gastroesophageal reflux disease)   . History of peritonitis    PD cath related peritonitis in April 2017  . History of transmetatarsal amputation of left foot (HCC) 12/27/2016  . Hypertension   . Peripheral vascular disease (HCC)   . PONV (postoperative nausea and vomiting)   . Sjogren's syndrome (HCC)    Per pt's wife, was diagnosed in 2016 during w/u for cause of renal failure prior to starting dialysis.  He had a rash on his chest apparently prompting this w/u.  Never had renal bx.  He  was referred to a rheum MD in [place] and per the wife he was treated with MTX and other medications but had side effects to "all of it" and isn't taking anything for this as of Jun 2017.   [name] Sleep apnea    wears Bipap  . Stroke (HCC)   . Wound dehiscence    Right foot    Past Surgical History:  Procedure Laterality Date  . ABDOMINAL AORTOGRAM W/LOWER EXTREMITY N/A 11/29/2016   Procedure: ABDOMINAL AORTOGRAM W/LOWER EXTREMITY;  Surgeon: Nada Libman, MD;  [facility];  Service: Cardiovascular;  Laterality: N/A;  . AMPUTATION Left 11/29/2016   Procedure: LEFT THIRD TOE AMPUTATION;  Surgeon: Nada Libman, MD;  Location:  [facility] OR;  Service: Vascular;  Laterality: Left;  . AMPUTATION Left 12/23/2016   Procedure: Left Transmetatarsal Amputation;  Surgeon: Nadara Mustard, MD;  Location: [facility] OR;  Service: Orthopedics;  Laterality: Left;  . AMPUTATION Left 01/11/2017   Procedure: LEFT BELOW KNEE AMPUTATION;  Surgeon: Nadara Mustard, MD;  Location: [facility] OR;  Service: Orthopedics;  Laterality: Left;  . AMPUTATION Right 01/20/2017   Procedure: AMPUTATION RIGHT FIFTH TOE;  Surgeon: Nada Libman, MD;  Location: [facility] OR;  Service: Vascular;  Laterality: Right;  . AMPUTATION Right 03/01/2017   Procedure: RIGHT BELOW KNEE AMPUTATION;  Surgeon: Nadara Mustard, MD;  Location: [facility] OR;  Service: Orthopedics;  Laterality: Right;  . ANGIOPLASTY  12/15/2016   Procedure: BALLOON ANGIOPLASTY;  Surgeon: Maeola Harman, MD;  Location: [facility] OR;  Service: Vascular;;  . cardiac catherization with stent placement  2017  . COLONOSCOPY W/ BIOPSIES AND POLYPECTOMY    . CORONARY ARTERY BYPASS GRAFT  2011  . FALSE ANEURYSM REPAIR Right 12/15/2016   Procedure: REPAIR FALSE ANEURYSM;  Surgeon: Maeola Harman, MD;  Location: [facility] OR;  Service: Vascular;  Laterality: Right;  . IRRIGATION AND DEBRIDEMENT FOOT Left 12/15/2016   Procedure: IRRIGATION AND DEBRIDEMENT FOOT;  Surgeon: Maeola Harman, MD;  Location: [facility] OR;  Service: Vascular;  Laterality: Left;  . LEFT HEART CATH AND CORS/GRAFTS ANGIOGRAPHY N/A 01/09/2017   Procedure: LEFT HEART CATH AND CORS/GRAFTS ANGIOGRAPHY;  Surgeon: Runell Gess, MD;  Location: [facility];  Service: Cardiovascular;  Laterality: N/A;  . LOWER EXTREMITY ANGIOGRAM Left 12/15/2016   Procedure: LOWER EXTREMITY ANGIOGRAM; PEDAL ACCESS;  Surgeon: Maeola Harman, MD;  Location: [facility] OR;  Service: Vascular;  Laterality: Left;  . PERIPHERAL VASCULAR BALLOON ANGIOPLASTY  11/29/2016   Procedure: PERIPHERAL VASCULAR BALLOON ANGIOPLASTY;  Surgeon: Nada Libman, MD;  Location: [facility]  [facility];  Service: Cardiovascular;;  LT Peroneal AT attempted unsuccessful  . STUMP REVISION Left 03/01/2017   Procedure: REVISION LEFT BELOW KNEE AMPUTATION;  Surgeon: Nadara Mustard, MD;  Location: [facility] OR;  Service: Orthopedics;  Laterality: Left;     Current Outpatient Medications  Medication Sig Dispense Refill  . Amino Acids-Protein Hydrolys (FEEDING SUPPLEMENT, PRO-STAT SUGAR FREE 64,) LIQD Take 30 mLs by mouth 2 (two) times daily. 900 mL 0  . amiodarone (PACERONE) 200 MG tablet Take 1 tablet (200 mg total) by mouth 2 (two) times daily. 60 tablet 1  . aspirin EC 81 MG tablet Take 81 mg by mouth daily.    [name] atorvastatin (LIPITOR) 80 MG tablet Take 1 tablet (80 mg total) by mouth daily at 6 PM. 30 tablet 0  . calcium acetate (PHOSLO) 667 MG capsule Take 1 capsule (667 mg total) by mouth  3 (three) times daily with meals. 90 capsule 0  . carvedilol (COREG) 6.25 MG tablet Take 1 tablet (6.25 mg total) by mouth 2 (two) times daily with a meal. Please keep upcoming appt in January for future refills. Thank you 60 tablet 0  . clindamycin (CLEOCIN) 150 MG capsule Take 1 capsule (150 mg total) by mouth 3 (three) times daily. 42 capsule 0  . clopidogrel (PLAVIX) 75 MG tablet Take 1 tablet (75 mg total) by mouth daily. 30 tablet 0  . Darbepoetin Alfa (ARANESP) 200 MCG/0.4ML SOSY injection Inject 0.4 mLs (200 mcg total) into the skin every Saturday at 6 PM. 1.68 mL   . docusate sodium (COLACE) 100 MG capsule Take 100 mg by mouth 2 (two) times daily.     . insulin aspart (NOVOLOG) 100 UNIT/ML injection Inject 0-8 Units into the skin 3 (three) times daily with meals. 10 mL 11  . insulin NPH Human (HUMULIN N,NOVOLIN N) 100 UNIT/ML injection Inject 0.3 mLs (30 Units total) into the skin 2 (two) times daily before a meal.    . multivitamin (RENA-VIT) TABS tablet Take 1 tablet by mouth at bedtime.  0  . nitroGLYCERIN (NITROSTAT) 0.4 MG SL tablet Place 0.4 mg under the tongue every 5 (five) minutes as needed for  chest pain.    . Nutritional Supplements (FEEDING SUPPLEMENT, NEPRO CARB STEADY,) LIQD Take 237 mLs by mouth at bedtime. 6000 mL 3  . oxyCODONE (OXY IR/ROXICODONE) 5 MG immediate release tablet Take 1 tablet (5 mg total) by mouth every 6 (six) hours as needed for severe pain. 20 tablet 0  . polyethylene glycol (MIRALAX / GLYCOLAX) packet Take 17 g by mouth daily.    . silver sulfADIAZINE (SILVADENE) 1 % cream Apply 1 application topically daily. 400 g 0  . Vitamin D, Ergocalciferol, (DRISDOL) 50000 units CAPS capsule Take 50,000 Units by mouth every 30 (thirty) days.      No current facility-administered medications for this visit.     Allergies:   Sulfa antibiotics; Vancomycin; Zanaflex [tizanidine hcl]; Gabapentin (once-daily); Lisinopril; Methotrexate derivatives; Tape; and Flexeril [cyclobenzaprine]    Social History:  The patient  reports that he has quit smoking. His smoking use included pipe. he has never used smokeless tobacco. He reports that he does not drink alcohol or use drugs.   Family History:  The patient's family history includes Diabetes in his mother; Heart disease in his father.    ROS:  General:no colds or fevers, no weight changes-  But we could not weigh today.   Skin:no rashes or ulcers HEENT:no blurred vision, no congestion CV:see HPI PUL:see HPI GI:no diarrhea constipation or melena, no indigestion GU:no hematuria, no dysuria- but has peritoneal dialysis MS:no joint pain, no claudication- now after amputations Neuro:no syncope, no lightheadedness, + weakness Endo:+ diabetes fairly well controlled, no thyroid disease  Wt Readings from Last 3 Encounters:  03/31/17 163 lb 2.3 oz (74 kg)  03/27/17 127 lb 13.9 oz (58 kg)  03/14/17 165 lb 5.5 oz (75 kg)     PHYSICAL EXAM: VS:  BP (!) 92/42   Pulse 70   Ht 5\' 9"  (1.753 m)   SpO2 95%   BMI 24.09 kg/m  , BMI Body mass index is 24.09 kg/m. General:Pleasant affect, NAD Skin:Warm and dry, brisk capillary  refill HEENT:normocephalic, sclera clear, mucus membranes moist Neck:supple, no JVD, no bruits  Heart:S1S2 RRR without murmur, gallup, rub or click Lungs:clear without rales, rhonchi, or wheezes NFA:OZHY, non tender, + BS, do  not palpate liver spleen or masses Ext:no lower ext edema, 2+ pedal pulses, 2+ radial pulses Neuro:alert and oriented, MAE, follows commands, + facial symmetry    EKG:  EKG is ordered today. The ekg ordered today demonstrates SR with 1st degree AV block Qtc of 495 ms.     Recent Labs: 03/12/2017: TSH 4.190 03/13/2017: Magnesium 1.7 03/29/2017: ALT 25; BUN 46; Creatinine, Ser 4.07; Hemoglobin 10.5; Platelets 445; Potassium 4.4; Sodium 133    Lipid Panel No results found for: CHOL, TRIG, HDL, CHOLHDL, VLDL, LDLCALC, LDLDIRECT     Other studies Reviewed: Additional studies/ records that were reviewed today include: . Cardiac cath 01/09/17 Procedures   LEFT HEART CATH AND CORS/GRAFTS ANGIOGRAPHY  Conclusion     Ost RCA to Prox RCA lesion, 0 %stenosed.  LM lesion, 100 %stenosed.  LIMA.  SVG.  Origin lesion, 100 %stenosed.  SVG.  Dist Graft to Insertion lesion, 40 %stenosed.  1st Mrg lesion, 75 %stenosed.  Ost Cx to Prox Cx lesion, 80 %stenosed.  Ost 3rd Mrg lesion, 100 %stenosed.  Ost 1st Mrg lesion, 100 %stenosed.  Mid Cx lesion, 60 %stenosed.     Mr. Charlott HollerCompton has a patent proximal RCA stent, a patent LIMA to the LAD. He has a patent sequential graft to 2 obtuse marginal branches. The first anastomosis has about a 60- 75% stenosis at the anastomosis.. The vein graft crossing that obtuse marginal branch has about 50-60% stenosis. There are no critical stenoses or copper lesions identified. Continue medical therapy will be recommended. The sheath was removed and pressure held on the left groin to achieve hemostasis. The patient left the lab in stable condition.  ECHO 03/13/17  Study Conclusions  - Left ventricle: The cavity size was  normal. There was moderate   concentric hypertrophy. Systolic function was severely reduced.   The estimated ejection fraction was in the range of 25% to 30%.   Hypokinesis of the anteroseptal, inferoseptal, apical inferior   and apical myocardium. Features are consistent with a   pseudonormal left ventricular filling pattern, with concomitant   abnormal relaxation and increased filling pressure (grade 2   diastolic dysfunction). Doppler parameters are consistent with   high ventricular filling pressure. - Aortic valve: Valve mobility was restricted. Transvalvular   velocity was within the normal range. There was no stenosis.   There was trivial regurgitation. - Mitral valve: Transvalvular velocity was within the normal range.   There was no evidence for stenosis. There was trivial   regurgitation. - Left atrium: The atrium was severely dilated. - Right ventricle: The cavity size was normal. Wall thickness was   normal. Systolic function was normal. - Tricuspid valve: There was trivial regurgitation. - Pulmonary arteries: Systolic pressure was within the normal   range. PA peak pressure: 32 mm Hg (S).   ASSESSMENT AND PLAN:  1.  MAT with increased rate.  Pt with labile hypotension so he was placed on amiodarone.  Has been on 200 mg BID for 2 weeks will continue for 2 more weeks then go to 200 mg daily.  Beginning 04/27/16.  EKG is stable today will check CMP and TSH.  2.  CAD with prior CABG and stent, currently no angina. 3.  ICM - euvolemic currently 4.  ESRD on peritoneal dialysis 5.  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      Current medicines are reviewed with the patient today.  The patient Has no concerns regarding medicines.  The following changes have been made:  See above Labs/ tests ordered today include:see above  Disposition:   FU:  see above  Signed, Nada Boozer, NP  04/12/2017 3:04 PM    [facility] [address] [place], [place], [place]   [address]/ [address] [place], [place] Phone: [phone]; Fax: [phone]  [phone]

## 2017-04-12 NOTE — Progress Notes (Signed)
Office Visit Note   Patient: [name]           Date of Birth: 12/05/1940           MRN: [id number] Visit Date: 04/12/2017              Requested by: Dorice [name], MD [address] [place], [place] [address] PCP: Dorice [name], MD  Chief Complaint  Patient presents with  . Left Leg - Routine Post Op  . Right Leg - Routine Post Op      HPI: Patient presents status post revision of left BKA and new R BKA on 03/01/17. Concerned for redness and increasing pain to right BKA. Pleased with healing L BKA.   Assessment & Plan: Visit Diagnoses:  1. History of left below knee amputation (HCC)   2. S/P bilateral below knee amputation (HCC)   3. Wound dehiscence     Plan: Will trial oral antibiotics for the right below the knee amputation.  Begin Silvadene dressing changes daily.  Continue with shrinker on the left.  We will follow-up in office in 1 week.  Discussed return precautions.  Patient understands that if the right below the knee irritation does not heal we may need to proceed with above-the-knee amputation on the right.  Follow-Up Instructions: Return in about 1 week (around 04/19/2017).   Ortho Exam  Patient is alert, oriented, no adenopathy, well-dressed, normal affect, normal respiratory effort. Examination patient is in a wheelchair.  The left below-the-knee amputation is well-healed.  Sutures have been harvested.  There is no erythema or drainage this is consolidating well.  The right below the knee amputation has ischemic changes there is a large eschar anteriorly and some dehiscence medially.  There is serous drainage.  Erythema distally tenderness.  There is no ascending cellulitis no odor  Imaging: No results found. No images are attached to the encounter.  Labs: Lab Results  Component Value Date   HGBA1C 8.8 (H) 12/24/2016   HGBA1C 11.0 (H) 11/26/2016   HGBA1C 9.4 (H) 09/22/2015   ESRSEDRATE 123 (H) 11/26/2016   CRP 19.3 (H)  11/26/2016   LABURIC 9.0 (H) 09/21/2015   REPTSTATUS 03/14/2017 FINAL 03/13/2017   GRAMSTAIN  02/06/2017    ABUNDANT WBC PRESENT, PREDOMINANTLY PMN FEW GRAM NEGATIVE RODS    CULT NO GROWTH 03/13/2017   LABORGA ESCHERICHIA COLI 02/06/2017    Orders:  No orders of the defined types were placed in this encounter.  Meds ordered this encounter  Medications  . silver sulfADIAZINE (SILVADENE) 1 % cream    Sig: Apply 1 application topically daily.    Dispense:  400 g    Refill:  0  . clindamycin (CLEOCIN) 150 MG capsule    Sig: Take 1 capsule (150 mg total) by mouth 3 (three) times daily.    Dispense:  42 capsule    Refill:  0     Procedures: No procedures performed  Clinical Data: No additional findings.  ROS:  All other systems negative, except as noted in the HPI. Review of Systems  Constitutional: Negative for chills and fever.  Skin: Positive for wound.    Objective: Vital Signs: There were no vitals taken for this visit.  Specialty Comments:  No specialty comments available.  PMFS History: Patient Active Problem List   Diagnosis Date Noted  . Chronic systolic HF (heart failure) (HCC)   . Atrial fibrillation with RVR (HCC) 03/28/2017  . Atrial tachycardia (HCC)   .  DCM (dilated cardiomyopathy) (HCC)   . Tachycardia   . Type 2 diabetes mellitus with peripheral neuropathy (HCC)   . New onset atrial fibrillation (HCC)   . Confusion 03/12/2017  . Loose stools   . Essential hypertension   . Acute combined systolic and diastolic congestive heart failure (HCC)   . Poorly controlled type 2 diabetes mellitus with peripheral neuropathy (HCC)   . S/P bilateral below knee amputation (HCC) 03/06/2017  . Constipation due to pain medication   . Coronary artery disease involving native coronary artery of native heart without angina pectoris   . Hyperlipidemia   . SIRS (systemic inflammatory response syndrome) (HCC) 03/05/2017  . Blood in stool   . Wound dehiscence   .  Rupture of operation wound   . Pressure injury of head, stage 3 (HCC)   . Poor nutrition   . Brittle diabetes (HCC)   . Adjustment disorder with depressed mood   . Hypoglycemia   . Ischemic pain of right foot   . Arterial hypotension   . Diabetes mellitus type 2 in obese (HCC)   . PVD (peripheral vascular disease) (HCC)   . Delirium   . History of left below knee amputation (HCC) 01/17/2017  . Acute blood loss anemia   . Elevated AST (SGOT) 01/09/2017  . Chest pain 01/09/2017  . NSTEMI (non-ST elevated myocardial infarction) (HCC) 01/09/2017  . Hypokalemia 01/07/2017  . Hypomagnesemia 01/07/2017  . Chronic hypotension 01/06/2017  . 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 IDDM (insulin dependent diabetes mellitus) (HCC) 01/06/2017  . Labile blood pressure   . Hallucination   . Benign essential HTN   . Ulcer of toe of right foot (HCC)   . Ulcer of external ear, limited to breakdown of skin (HCC)   . Chronic congestive heart failure (HCC)   . Anemia of chronic disease   . Labile blood glucose   . 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 Diabetic peripheral neuropathy (HCC)   . Debility 12/27/2016  . Constipation   . Chronic obstructive pulmonary disease (HCC)   . Nausea   . ESRD on dialysis (HCC)   . Leukocytosis   . Tachypnea   . FUO (fever of unknown origin)   . Encephalopathy 12/23/2016  . Sore throat 12/23/2016  . PAD (peripheral artery disease) (HCC) 11/26/2016  . Sjogren's syndrome (HCC) 09/26/2015  . Uncontrolled type 2 diabetes mellitus with hyperglycemia, with long-term current use of insulin (HCC) 09/26/2015  . Hyperosmolar non-ketotic state in patient with type 2 diabetes mellitus (HCC) 09/25/2015  . Ileus (HCC) 09/22/2015  . Acute respiratory failure with hypoxia (HCC) 09/22/2015  . Acute gouty arthritis 09/22/2015  . CAD (coronary artery disease) of artery bypass graft 09/22/2015  . Sepsis  (HCC) 09/21/2015  . Elevated troponin level 09/21/2015  . ESRD (end stage renal disease) (HCC) 09/21/2015  . CVA (cerebral infarction) 09/21/2015   Past Medical History:  Diagnosis Date  . AICD (automatic cardioverter/defibrillator) present    [employer] [phone] lead [id number]  . Arthritis   . CHF (congestive heart failure) (HCC)   . Complication of anesthesia   . COPD (chronic obstructive pulmonary disease) (HCC)   . Coronary artery disease    CABG 2011  . Diabetes mellitus without complication (HCC)   . ESRD on peritoneal dialysis [facility])    Started PD Dec 2016 in [place]. Nephrology is in [place].   . Foot infection 01/06/2017  .  Gangrene (HCC)    left knee  . Gangrene of left foot (HCC) 11/26/2016  . Gangrene of right foot (HCC) 02/23/2017  . GERD (gastroesophageal reflux disease)   . History of peritonitis    PD cath related peritonitis in April 2017  . History of transmetatarsal amputation of left foot (HCC) 12/27/2016  . Hypertension   . Peripheral vascular disease (HCC)   . PONV (postoperative nausea and vomiting)   . Sjogren's syndrome (HCC)    Per pt's wife, was diagnosed in 2016 during w/u for cause of renal failure prior to starting dialysis.  He had a rash on his chest apparently prompting this w/u.  Never had renal bx.  He was referred to a rheum MD in [place] and per the wife he was treated with MTX and other medications but had side effects to "all of it" and isn't taking anything for this as of Jun 2017.   [name] Sleep apnea    wears Bipap  . Stroke (HCC)   . Wound dehiscence    Right foot    Family History  Problem Relation Age of Onset  . Heart disease Father   . Diabetes Mother     Past Surgical History:  Procedure Laterality Date  . ABDOMINAL AORTOGRAM W/LOWER EXTREMITY N/A 11/29/2016   Procedure: ABDOMINAL AORTOGRAM W/LOWER EXTREMITY;  Surgeon: Nada Libman, MD;  [facility];  Service: Cardiovascular;  Laterality: N/A;    . AMPUTATION Left 11/29/2016   Procedure: LEFT THIRD TOE AMPUTATION;  Surgeon: Nada Libman, MD;  Location: [facility] OR;  Service: Vascular;  Laterality: Left;  . AMPUTATION Left 12/23/2016   Procedure: Left Transmetatarsal Amputation;  Surgeon: Nadara Mustard, MD;  Location: [facility] OR;  Service: Orthopedics;  Laterality: Left;  . AMPUTATION Left 01/11/2017   Procedure: LEFT BELOW KNEE AMPUTATION;  Surgeon: Nadara Mustard, MD;  Location: [facility] OR;  Service: Orthopedics;  Laterality: Left;  . AMPUTATION Right 01/20/2017   Procedure: AMPUTATION RIGHT FIFTH TOE;  Surgeon: Nada Libman, MD;  Location: [facility] OR;  Service: Vascular;  Laterality: Right;  . AMPUTATION Right 03/01/2017   Procedure: RIGHT BELOW KNEE AMPUTATION;  Surgeon: Nadara Mustard, MD;  Location: [facility] OR;  Service: Orthopedics;  Laterality: Right;  . ANGIOPLASTY  12/15/2016   Procedure: BALLOON ANGIOPLASTY;  Surgeon: Maeola Harman, MD;  Location: [facility] OR;  Service: Vascular;;  . cardiac catherization with stent placement  2017  . COLONOSCOPY W/ BIOPSIES AND POLYPECTOMY    . CORONARY ARTERY BYPASS GRAFT  2011  . FALSE ANEURYSM REPAIR Right 12/15/2016   Procedure: REPAIR FALSE ANEURYSM;  Surgeon: Maeola Harman, MD;  Location: [facility] OR;  Service: Vascular;  Laterality: Right;  . IRRIGATION AND DEBRIDEMENT FOOT Left 12/15/2016   Procedure: IRRIGATION AND DEBRIDEMENT FOOT;  Surgeon: Maeola Harman, MD;  Location: [facility] OR;  Service: Vascular;  Laterality: Left;  . LEFT HEART CATH AND CORS/GRAFTS ANGIOGRAPHY N/A 01/09/2017   Procedure: LEFT HEART CATH AND CORS/GRAFTS ANGIOGRAPHY;  Surgeon: Runell Gess, MD;  [facility];  Service: Cardiovascular;  Laterality: N/A;  . LOWER EXTREMITY ANGIOGRAM Left 12/15/2016   Procedure: LOWER EXTREMITY ANGIOGRAM; PEDAL ACCESS;  Surgeon: Maeola Harman, MD;  Location: [facility] OR;  Service: Vascular;  Laterality: Left;  . PERIPHERAL VASCULAR BALLOON ANGIOPLASTY   11/29/2016   Procedure: PERIPHERAL VASCULAR BALLOON ANGIOPLASTY;  Surgeon: Nada Libman, MD;  [facility];  Service: Cardiovascular;;  LT Peroneal AT  attempted unsuccessful  . STUMP REVISION Left 03/01/2017   Procedure: REVISION LEFT BELOW KNEE AMPUTATION;  Surgeon: [name], Marcus V, MD;  [facility];  Service: Orthopedics;  Laterality: Left;   Social History   Occupational History  . Not on file  Tobacco Use  . Smoking status: Former Smoker    Types: Pipe  . Smokeless tobacco: Never Used  . Tobacco comment: 30 years ago 02/28/17  Substance and Sexual Activity  . Alcohol use: No  . Drug use: No  . Sexual activity: No

## 2017-04-13 LAB — COMPREHENSIVE METABOLIC PANEL
ALBUMIN: 2.3 g/dL — AB (ref 3.5–4.8)
ALT: 14 IU/L (ref 0–44)
AST: 17 IU/L (ref 0–40)
Albumin/Globulin Ratio: 0.7 — ABNORMAL LOW (ref 1.2–2.2)
Alkaline Phosphatase: 111 IU/L (ref 39–117)
BUN/Creatinine Ratio: 8 — ABNORMAL LOW (ref 10–24)
BUN: 45 mg/dL — AB (ref 8–27)
Bilirubin Total: <0.2 mg/dL (ref 0.0–1.2)
CALCIUM: 8.5 mg/dL — AB (ref 8.6–10.2)
CHLORIDE: 90 mmol/L — AB (ref 96–106)
CO2: 26 mmol/L (ref 20–29)
CREATININE: 5.71 mg/dL — AB (ref 0.76–1.27)
GFR calc Af Amer: 10 mL/min/{1.73_m2} — ABNORMAL LOW (ref >59)
GFR calc non Af Amer: 9 mL/min/{1.73_m2} — ABNORMAL LOW (ref >59)
Globulin, Total: 3.1 g/dL (ref 1.5–4.5)
Glucose: 338 mg/dL — ABNORMAL HIGH (ref 65–99)
Potassium: 3.9 mmol/L (ref 3.5–5.2)
Sodium: 133 mmol/L — ABNORMAL LOW (ref 134–144)
TOTAL PROTEIN: 5.4 g/dL — AB (ref 6.0–8.5)

## 2017-04-13 LAB — TSH: TSH: 4.35 u[IU]/mL (ref 0.450–4.500)

## 2017-04-20 ENCOUNTER — Encounter (INDEPENDENT_AMBULATORY_CARE_PROVIDER_SITE_OTHER): Payer: Self-pay | Admitting: Family

## 2017-04-20 ENCOUNTER — Ambulatory Visit (INDEPENDENT_AMBULATORY_CARE_PROVIDER_SITE_OTHER): Payer: Medicare Other | Admitting: Orthopedic Surgery

## 2017-04-20 DIAGNOSIS — Z89512 Acquired absence of left leg below knee: Secondary | ICD-10-CM

## 2017-04-20 DIAGNOSIS — Z89511 Acquired absence of right leg below knee: Secondary | ICD-10-CM

## 2017-04-20 DIAGNOSIS — T8130XA Disruption of wound, unspecified, initial encounter: Secondary | ICD-10-CM

## 2017-04-20 NOTE — Progress Notes (Signed)
Office Visit Note   Patient: [name]           Date of Birth: [birth year]           MRN: [id number] Visit Date: 04/20/2017              Requested by: Dorice [name], MD [address] [place], [place] [address] PCP: Dorice [name], MD  Chief Complaint  Patient presents with  . Right Knee - Pain  . Left Knee - Pain      HPI: Patient is a 77 year old gentleman status post bilateral transtibial amputations who has a well-healed left transtibial amputation and has progressive gangrenous dehiscence of the right transtibial amputation despite good hair growth on the leg.  Patient states that he is not interested in any type of prosthesis he wants to use his electric wheelchair does not want to go through the therapy  Assessment & Plan: Visit Diagnoses:  1. S/P bilateral below knee amputation (HCC)   2. Wound dehiscence     Plan: Patient would like to proceed with a right above-the-knee amputation he would like to proceed next friday  Follow-Up Instructions: Return in about 2 weeks (around 05/04/2017).   Ortho Exam  Patient is alert, oriented, no adenopathy, well-dressed, normal affect, normal respiratory effort. Patient would like to proceed with a above-the-knee amputation on the right.  Risks and benefits were discussed patient states he understands he would like to proceed next Friday.  Imaging: No results found. No images are attached to the encounter.  Labs: Lab Results  Component Value Date   HGBA1C 8.8 (H) 12/24/2016   HGBA1C 11.0 (H) 11/26/2016   HGBA1C 9.4 (H) 09/22/2015   ESRSEDRATE 123 (H) 11/26/2016   CRP 19.3 (H) 11/26/2016   LABURIC 9.0 (H) 09/21/2015   REPTSTATUS 03/14/2017 FINAL 03/13/2017   GRAMSTAIN  02/06/2017    ABUNDANT WBC PRESENT, PREDOMINANTLY PMN FEW GRAM NEGATIVE RODS    CULT NO GROWTH 03/13/2017   LABORGA ESCHERICHIA COLI 02/06/2017    @LABSALLVALUES (HGBA1)@  There is no height or weight on file to  calculate BMI.  Orders:  No orders of the defined types were placed in this encounter.  No orders of the defined types were placed in this encounter.    Procedures: No procedures performed  Clinical Data: No additional findings.  ROS:  All other systems negative, except as noted in the HPI. Review of Systems  Objective: Vital Signs: There were no vitals taken for this visit.  Specialty Comments:  No specialty comments available.  PMFS History: Patient Active Problem List   Diagnosis Date Noted  . Chronic systolic HF (heart failure) (HCC)   . Atrial fibrillation with RVR (HCC) 03/28/2017  . Atrial tachycardia (HCC)   . DCM (dilated cardiomyopathy) (HCC)   . Tachycardia   . Type 2 diabetes mellitus with peripheral neuropathy (HCC)   . New onset atrial fibrillation (HCC)   . Confusion 03/12/2017  . Loose stools   . Essential hypertension   . Acute combined systolic and diastolic congestive heart failure (HCC)   . Poorly controlled type 2 diabetes mellitus with peripheral neuropathy (HCC)   . S/P bilateral below knee amputation (HCC) 03/06/2017  . Constipation due to pain medication   . Coronary artery disease involving native coronary artery of native heart without angina pectoris   . Hyperlipidemia   . SIRS (systemic inflammatory response syndrome) (HCC) 03/05/2017  . Blood in stool   . Wound dehiscence   .  Rupture of operation wound   . Pressure injury of head, stage 3 (HCC)   . Poor nutrition   . Brittle diabetes (HCC)   . Adjustment disorder with depressed mood   . Hypoglycemia   . Ischemic pain of right foot   . Arterial hypotension   . Diabetes mellitus type 2 in obese (HCC)   . PVD (peripheral vascular disease) (HCC)   . Delirium   . History of left below knee amputation (HCC) 01/17/2017  . Acute blood loss anemia   . Elevated AST (SGOT) 01/09/2017  . Chest pain 01/09/2017  . NSTEMI (non-ST elevated myocardial infarction) (HCC) 01/09/2017  .  Hypokalemia 01/07/2017  . Hypomagnesemia 01/07/2017  . Chronic hypotension 01/06/2017  . 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 IDDM (insulin dependent diabetes mellitus) (HCC) 01/06/2017  . Labile blood pressure   . Hallucination   . Benign essential HTN   . Ulcer of toe of right foot (HCC)   . Ulcer of external ear, limited to breakdown of skin (HCC)   . Chronic congestive heart failure (HCC)   . Anemia of chronic disease   . Labile blood glucose   . 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 Diabetic peripheral neuropathy (HCC)   . Debility 12/27/2016  . Constipation   . Chronic obstructive pulmonary disease (HCC)   . Nausea   . ESRD on dialysis (HCC)   . Leukocytosis   . Tachypnea   . FUO (fever of unknown origin)   . Encephalopathy 12/23/2016  . Sore throat 12/23/2016  . PAD (peripheral artery disease) (HCC) 11/26/2016  . Sjogren's syndrome (HCC) 09/26/2015  . Uncontrolled type 2 diabetes mellitus with hyperglycemia, with long-term current use of insulin (HCC) 09/26/2015  . Hyperosmolar non-ketotic state in patient with type 2 diabetes mellitus (HCC) 09/25/2015  . Ileus (HCC) 09/22/2015  . Acute respiratory failure with hypoxia (HCC) 09/22/2015  . Acute gouty arthritis 09/22/2015  . CAD (coronary artery disease) of artery bypass graft 09/22/2015  . Sepsis (HCC) 09/21/2015  . Elevated troponin level 09/21/2015  . ESRD (end stage renal disease) (HCC) 09/21/2015  . CVA (cerebral infarction) 09/21/2015   Past Medical History:  Diagnosis Date  . AICD (automatic cardioverter/defibrillator) present    [employer] [phone] lead [id number]  . Arthritis   . CHF (congestive heart failure) (HCC)   . Complication of anesthesia   . COPD (chronic obstructive pulmonary disease) (HCC)   . Coronary artery disease    CABG 2011  . Diabetes mellitus without complication (HCC)   . ESRD on peritoneal dialysis  [facility])    Started PD Dec 2016 in [place]. Nephrology is in [place].   . Foot infection 01/06/2017  . Gangrene (HCC)    left knee  . Gangrene of left foot (HCC) 11/26/2016  . Gangrene of right foot (HCC) 02/23/2017  . GERD (gastroesophageal reflux disease)   . History of peritonitis    PD cath related peritonitis in April 2017  . History of transmetatarsal amputation of left foot (HCC) 12/27/2016  . Hypertension   . Peripheral vascular disease (HCC)   . PONV (postoperative nausea and vomiting)   . Sjogren's syndrome (HCC)    Per pt's wife, was diagnosed in 2016 during w/u for cause of renal failure prior to starting dialysis.  He had a rash on his chest apparently prompting this w/u.  Never had renal bx.  He [place] and per the wife he was treated with MTX and other medications but had side effects to "all of it" and isn't taking anything for this as of Jun 2017.   [name]. Sleep apnea    wears Bipap  . Stroke (HCC)   . Wound dehiscence    Right foot    Family History  Problem Relation Age of Onset  . Heart disease Father   . Diabetes Mother     Past Surgical History:  Procedure Laterality Date  . ABDOMINAL AORTOGRAM W/LOWER EXTREMITY N/A 11/29/2016   Procedure: ABDOMINAL AORTOGRAM W/LOWER EXTREMITY;  Surgeon: Nada LibmanBrabham, Vance W, MD;  [facility];  Service: Cardiovascular;  Laterality: N/A;  . AMPUTATION Left 11/29/2016   Procedure: LEFT THIRD TOE AMPUTATION;  Surgeon: Nada LibmanBrabham, Vance W, MD;  [facility];  Service: Vascular;  Laterality: Left;  . AMPUTATION Left 12/23/2016   Procedure: Left Transmetatarsal Amputation;  Surgeon: [name], Oberon Hehir V, MD;  [facility];  Service: Orthopedics;  Laterality: Left;  . AMPUTATION Left 01/11/2017   Procedure: LEFT BELOW KNEE AMPUTATION;  Surgeon: [name], Jocob Dambach V, MD;  [facility];  Service: Orthopedics;  Laterality: Left;  . AMPUTATION Right 01/20/2017   Procedure: AMPUTATION RIGHT FIFTH TOE;   Surgeon: Nada LibmanBrabham, Vance W, MD;  [facility];  Service: Vascular;  Laterality: Right;  . AMPUTATION Right 03/01/2017   Procedure: RIGHT BELOW KNEE AMPUTATION;  Surgeon: [name], Tayari Yankee V, MD;  [facility];  Service: Orthopedics;  Laterality: Right;  . ANGIOPLASTY  12/15/2016   Procedure: BALLOON ANGIOPLASTY;  Surgeon: Maeola Harmanain, Brandon Christopher, MD;  [facility];  Service: Vascular;;  . cardiac catherization with stent placement  2017  . COLONOSCOPY W/ BIOPSIES AND POLYPECTOMY    . CORONARY ARTERY BYPASS GRAFT  2011  . FALSE ANEURYSM REPAIR Right 12/15/2016   Procedure: REPAIR FALSE ANEURYSM;  Surgeon: Maeola Harmanain, Brandon Christopher, MD;  [facility];  Service: Vascular;  Laterality: Right;  . IRRIGATION AND DEBRIDEMENT FOOT Left 12/15/2016   Procedure: IRRIGATION AND DEBRIDEMENT FOOT;  Surgeon: Maeola Harmanain, Brandon Christopher, MD;  [facility];  Service: Vascular;  Laterality: Left;  . LEFT HEART CATH AND CORS/GRAFTS ANGIOGRAPHY N/A 01/09/2017   Procedure: LEFT HEART CATH AND CORS/GRAFTS ANGIOGRAPHY;  Surgeon: Runell GessBerry, Jonathan J, MD;  [facility];  Service: Cardiovascular;  Laterality: N/A;  . LOWER EXTREMITY ANGIOGRAM Left 12/15/2016   Procedure: LOWER EXTREMITY ANGIOGRAM; PEDAL ACCESS;  Surgeon: Maeola Harmanain, Brandon Christopher, MD;  [facility];  Service: Vascular;  Laterality: Left;  . PERIPHERAL VASCULAR BALLOON ANGIOPLASTY  11/29/2016   Procedure: PERIPHERAL VASCULAR BALLOON ANGIOPLASTY;  Surgeon: Nada LibmanBrabham, Vance W, MD;  [facility];  Service: Cardiovascular;;  LT Peroneal AT attempted unsuccessful  . STUMP REVISION Left 03/01/2017   Procedure: REVISION LEFT BELOW KNEE AMPUTATION;  Surgeon: [name], Alicyn Klann V, MD;  [facility];  Service: Orthopedics;  Laterality: Left;   Social History   Occupational History  . Not on file  Tobacco Use  . Smoking status: Former Smoker    Types: Pipe  . Smokeless tobacco: Never Used  . Tobacco comment: 30 years ago 02/28/17    Substance and Sexual Activity  . Alcohol use: No  . Drug use: No  . Sexual activity: No

## 2017-04-25 ENCOUNTER — Other Ambulatory Visit (INDEPENDENT_AMBULATORY_CARE_PROVIDER_SITE_OTHER): Payer: Self-pay | Admitting: Orthopedic Surgery

## 2017-04-25 DIAGNOSIS — I70261 Atherosclerosis of native arteries of extremities with gangrene, right leg: Secondary | ICD-10-CM

## 2017-04-27 ENCOUNTER — Other Ambulatory Visit: Payer: Self-pay

## 2017-04-27 ENCOUNTER — Encounter (HOSPITAL_COMMUNITY): Payer: Self-pay | Admitting: *Deleted

## 2017-04-27 NOTE — Progress Notes (Signed)
Mr [name] denies chest pain or shortness of breath.  Patient has Type II diabetes. CBG run 100- 300, depending on if he has dialysis fluid in.  I instructed patient to come to the hospital with no fluid in abdomen. Mr . [name] has a sigmoidoscopy today and asked me to speak to his wife for othter questions.  I instructed [name] for patient to take 1/2 of NPH Insulin tonight- 15 units.  If CBG > 70 in am to take 15 units of NPH.  If CBG >220  (with dialysis fluid drained) to take 1/2 of SS Insulin. If CBG < 70 to take 4 glucose tabs and recheck in 15 minutes and to call pre- op desk for further instructions.

## 2017-04-27 NOTE — Progress Notes (Signed)
Anesthesia Chart Review: SAME DAY WORK-UP.  Patient is a 77 year old male scheduled for right AKA. DX: Gangrene right BKA) on 04/28/17 by Aldean Baker, MD.  History includes former smoker, post-operative N/V, HTN, CAD (CABG '04; PCI 09/2015 [facility]; NSTEMI 07/19/16 s/p DES RCA 07/20/16 [facility]; 01/08/17 NSTEMI no critical lesion 01/09/17 [facility], medical therapy), chronic systolic CHF, ischemic cardiomyopathy, [employer] SQ Emblem S-ICD 11/23/16, atrial tachycardia 03/2017, DM2, COPD, OSA (BiPAP), CVA, ESRD (started PD 03/2015 with peritonitis 07/2015; has left AVF), PAD (angioplasty left tibioperoneal trunk/peroneal artery and left third toe ray amputation 11/29/16; open repair of right CFA pseudoaneurysm and left AT atherectomy and angioplasty 12/15/16; left TMA 12/23/16; left BKA 01/11/17; right 5th toe amputation 01/20/17; right BKA and revision left BKA 03/01/17), Sjogren's syndrome, GERD, arthritis.  - PCP is Dhivianathan, Birdie Hopes, MD in [place], [place]. - Cardiologist [facility]): Nanetta Batty, MD. Last visit with Nada Boozer, NP on 04/12/17 for hospital follow-up with diagnosis of atrial tachycardia. He was placed on amiodarone due to hypotension. No anticoagulation felt warranted. On-going follow-up planned with his primary cardiologist is Dent. He was classified as moderate CV risk (for prior BKA 01/20/17) following 01/09/17 cardiac cath. - Cardiolgoist ([place]): Yehuda Budd, MD.  - EP Cardiologist is Cyndy Freeze, MD [facility] Everywhere).  - Nephrologist is Otis Brace, MD in [place].   Currently med list includes amiodarone, aspirin 81 mg, Lipitor, PhosLo, Coreg, Plavix, Aranesp, NovoLog, insulin NPH, Rena-vit, Nitro, Oxy IR. Patient remaining on ASA and Plavix (confirmed with patient's wife and with Elnita Maxwell at Dr. Audrie Lia office).  EKG 04/12/17: SR with first degree AV block, rightward axis, non-specific ST abnormality, prolonged QT (QT 440 ms, QTc 495 ms).  Echocardiogram 03/13/17: Study  Conclusions - Left ventricle: The cavity size was normal. There was moderate   concentric hypertrophy. Systolic function was severely reduced.   The estimated ejection fraction was in the range of 25% to 30%.   Hypokinesis of the anteroseptal, inferoseptal, apical inferior   and apical myocardium. Features are consistent with a   pseudonormal left ventricular filling pattern, with concomitant   abnormal relaxation and increased filling pressure (grade 2   diastolic dysfunction). Doppler parameters are consistent with   high ventricular filling pressure. - Aortic valve: Valve mobility was restricted. Transvalvular   velocity was within the normal range. There was no stenosis.   There was trivial regurgitation. - Mitral valve: Transvalvular velocity was within the normal range.   There was no evidence for stenosis. There was trivial   regurgitation. - Left atrium: The atrium was severely dilated. - Right ventricle: The cavity size was normal. Wall thickness was   normal. Systolic function was normal. - Tricuspid valve: There was trivial regurgitation. - Pulmonary arteries: Systolic pressure was within the normal   range. PA peak pressure: 32 mm Hg (S). (Previous EF 35% 07/22/16, 10/14/16 [facility] Everywhere.)  Cardiac cath 01/09/17:   Ost RCA to Prox RCA lesion, 0 %stenosed.  LM lesion, 100 %stenosed.  LIMA.  SVG.  Origin lesion, 100 %stenosed.  SVG.  Dist Graft to Insertion lesion, 40 %stenosed.  1st Mrg lesion, 75 %stenosed.  Ost Cx to Prox Cx lesion, 80 %stenosed.  Ost 3rd Mrg lesion, 100 %stenosed.  Ost 1st Mrg lesion, 100 %stenosed.  Mid Cx lesion, 60 %stenosed. IMPRESSION: Mr. Molner has a patent proximal RCA stent, a patent LIMA to the LAD. He has a patent sequential graft to 2 obtuse marginal branches. The first anastomosis has about a  60- 75% stenosis at the anastomosis.. The vein graft crossing that obtuse marginal branch has about 50-60% stenosis. There are no  critical stenoses or copper lesions identified. Continue medical therapy will be recommended.  CXR 01/12/17: IMPRESSION: 1. Stable cardiomegaly without overt pulmonary edema. 2. Trace bilateral pleural effusions and mild left basilar Atelectasis.  Labs from 04/12/17 noted. Cr 5.71, K 3.9. AST 17, ALT 14. Glucose 338. TSH 4.350. H/H on 03/29/17 was 10.5/33.9. He will need updated labs prior to surgery. He reported CBGs run 100-300 depending on "if he has dialysis fluid in" or not.   Patient with significant cardiac history as above. DES 07/2016. He remains on DAPT. Last cath 01/2017 and felt moderate risk for previous BKA. Has [place]-ICD. Atrial tachycardia in 03/2017. He is on amiodarone. Recent cardiology follow-[facility]. DM is not optimally controlled (A1c 8.8 12/24/16, but was down from 11.0). He is on PD. He has tolerated multiple procedures (PV and in OR) within the past several months. Further evaluation by his anesthesia team on the day of surgery.   Velna Ochsllison Loren Sawaya, PA-C [facility]/Anesthesiology Phone [phone] 04/27/2017 4:11 PM

## 2017-04-28 ENCOUNTER — Inpatient Hospital Stay (HOSPITAL_COMMUNITY)
Admission: RE | Admit: 2017-04-28 | Discharge: 2017-05-04 | DRG: 239 | Disposition: A | Discharge status: Home / Self Care | Payer: Medicare Other | Attending: Orthopedic Surgery | Admitting: Orthopedic Surgery

## 2017-04-28 ENCOUNTER — Inpatient Hospital Stay (HOSPITAL_COMMUNITY): Payer: Medicare Other | Admitting: Vascular Surgery

## 2017-04-28 ENCOUNTER — Encounter (HOSPITAL_COMMUNITY)
Admission: RE | Disposition: A | Discharge status: Home / Self Care | Payer: Self-pay | Source: Home / Self Care | Attending: Orthopedic Surgery

## 2017-04-28 ENCOUNTER — Encounter (HOSPITAL_COMMUNITY): Payer: Self-pay

## 2017-04-28 DIAGNOSIS — T8781 Dehiscence of amputation stump: Secondary | ICD-10-CM | POA: Diagnosis present

## 2017-04-28 DIAGNOSIS — Z8673 Personal history of transient ischemic attack (TIA), and cerebral infarction without residual deficits: Secondary | ICD-10-CM

## 2017-04-28 DIAGNOSIS — D631 Anemia in chronic kidney disease: Secondary | ICD-10-CM | POA: Diagnosis present

## 2017-04-28 DIAGNOSIS — I739 Peripheral vascular disease, unspecified: Secondary | ICD-10-CM | POA: Diagnosis present

## 2017-04-28 DIAGNOSIS — I70261 Atherosclerosis of native arteries of extremities with gangrene, right leg: Secondary | ICD-10-CM | POA: Insufficient documentation

## 2017-04-28 DIAGNOSIS — D72829 Elevated white blood cell count, unspecified: Secondary | ICD-10-CM

## 2017-04-28 DIAGNOSIS — F05 Delirium due to known physiological condition: Secondary | ICD-10-CM | POA: Diagnosis present

## 2017-04-28 DIAGNOSIS — G4733 Obstructive sleep apnea (adult) (pediatric): Secondary | ICD-10-CM

## 2017-04-28 DIAGNOSIS — I132 Hypertensive heart and chronic kidney disease with heart failure and with stage 5 chronic kidney disease, or end stage renal disease: Secondary | ICD-10-CM | POA: Diagnosis present

## 2017-04-28 DIAGNOSIS — N186 End stage renal disease: Secondary | ICD-10-CM

## 2017-04-28 DIAGNOSIS — I255 Ischemic cardiomyopathy: Secondary | ICD-10-CM | POA: Diagnosis present

## 2017-04-28 DIAGNOSIS — E1152 Type 2 diabetes mellitus with diabetic peripheral angiopathy with gangrene: Principal | ICD-10-CM | POA: Diagnosis present

## 2017-04-28 DIAGNOSIS — E8889 Other specified metabolic disorders: Secondary | ICD-10-CM | POA: Diagnosis present

## 2017-04-28 DIAGNOSIS — Z8719 Personal history of other diseases of the digestive system: Secondary | ICD-10-CM

## 2017-04-28 DIAGNOSIS — R627 Adult failure to thrive: Secondary | ICD-10-CM | POA: Diagnosis present

## 2017-04-28 DIAGNOSIS — L299 Pruritus, unspecified: Secondary | ICD-10-CM | POA: Diagnosis not present

## 2017-04-28 DIAGNOSIS — Z794 Long term (current) use of insulin: Secondary | ICD-10-CM

## 2017-04-28 DIAGNOSIS — R410 Disorientation, unspecified: Secondary | ICD-10-CM | POA: Diagnosis not present

## 2017-04-28 DIAGNOSIS — Z951 Presence of aortocoronary bypass graft: Secondary | ICD-10-CM

## 2017-04-28 DIAGNOSIS — K219 Gastro-esophageal reflux disease without esophagitis: Secondary | ICD-10-CM | POA: Diagnosis present

## 2017-04-28 DIAGNOSIS — Z833 Family history of diabetes mellitus: Secondary | ICD-10-CM

## 2017-04-28 DIAGNOSIS — Z87891 Personal history of nicotine dependence: Secondary | ICD-10-CM

## 2017-04-28 DIAGNOSIS — S78111A Complete traumatic amputation at level between right hip and knee, initial encounter: Secondary | ICD-10-CM | POA: Insufficient documentation

## 2017-04-28 DIAGNOSIS — M1990 Unspecified osteoarthritis, unspecified site: Secondary | ICD-10-CM | POA: Diagnosis present

## 2017-04-28 DIAGNOSIS — M3500 Sicca syndrome, unspecified: Secondary | ICD-10-CM | POA: Diagnosis present

## 2017-04-28 DIAGNOSIS — Z993 Dependence on wheelchair: Secondary | ICD-10-CM

## 2017-04-28 DIAGNOSIS — N390 Urinary tract infection, site not specified: Secondary | ICD-10-CM | POA: Diagnosis present

## 2017-04-28 DIAGNOSIS — Z881 Allergy status to other antibiotic agents status: Secondary | ICD-10-CM

## 2017-04-28 DIAGNOSIS — K648 Other hemorrhoids: Secondary | ICD-10-CM | POA: Diagnosis present

## 2017-04-28 DIAGNOSIS — J449 Chronic obstructive pulmonary disease, unspecified: Secondary | ICD-10-CM | POA: Diagnosis present

## 2017-04-28 DIAGNOSIS — D62 Acute posthemorrhagic anemia: Secondary | ICD-10-CM | POA: Diagnosis not present

## 2017-04-28 DIAGNOSIS — Z89611 Acquired absence of right leg above knee: Secondary | ICD-10-CM | POA: Diagnosis not present

## 2017-04-28 DIAGNOSIS — I2510 Atherosclerotic heart disease of native coronary artery without angina pectoris: Secondary | ICD-10-CM | POA: Diagnosis present

## 2017-04-28 DIAGNOSIS — I5042 Chronic combined systolic (congestive) and diastolic (congestive) heart failure: Secondary | ICD-10-CM | POA: Diagnosis present

## 2017-04-28 DIAGNOSIS — E785 Hyperlipidemia, unspecified: Secondary | ICD-10-CM | POA: Diagnosis present

## 2017-04-28 DIAGNOSIS — Z8249 Family history of ischemic heart disease and other diseases of the circulatory system: Secondary | ICD-10-CM

## 2017-04-28 DIAGNOSIS — E1122 Type 2 diabetes mellitus with diabetic chronic kidney disease: Secondary | ICD-10-CM | POA: Diagnosis present

## 2017-04-28 DIAGNOSIS — K5731 Diverticulosis of large intestine without perforation or abscess with bleeding: Secondary | ICD-10-CM | POA: Diagnosis present

## 2017-04-28 DIAGNOSIS — E1165 Type 2 diabetes mellitus with hyperglycemia: Secondary | ICD-10-CM | POA: Diagnosis present

## 2017-04-28 DIAGNOSIS — IMO0002 Reserved for concepts with insufficient information to code with codable children: Secondary | ICD-10-CM

## 2017-04-28 DIAGNOSIS — G8918 Other acute postprocedural pain: Secondary | ICD-10-CM | POA: Diagnosis not present

## 2017-04-28 DIAGNOSIS — I10 Essential (primary) hypertension: Secondary | ICD-10-CM | POA: Diagnosis present

## 2017-04-28 DIAGNOSIS — Z89612 Acquired absence of left leg above knee: Secondary | ICD-10-CM

## 2017-04-28 DIAGNOSIS — Y835 Amputation of limb(s) as the cause of abnormal reaction of the patient, or of later complication, without mention of misadventure at the time of the procedure: Secondary | ICD-10-CM | POA: Diagnosis present

## 2017-04-28 DIAGNOSIS — E871 Hypo-osmolality and hyponatremia: Secondary | ICD-10-CM | POA: Diagnosis not present

## 2017-04-28 DIAGNOSIS — Z882 Allergy status to sulfonamides status: Secondary | ICD-10-CM

## 2017-04-28 DIAGNOSIS — Z7982 Long term (current) use of aspirin: Secondary | ICD-10-CM

## 2017-04-28 DIAGNOSIS — E109 Type 1 diabetes mellitus without complications: Secondary | ICD-10-CM | POA: Diagnosis not present

## 2017-04-28 DIAGNOSIS — Z992 Dependence on renal dialysis: Secondary | ICD-10-CM | POA: Diagnosis not present

## 2017-04-28 DIAGNOSIS — Z89432 Acquired absence of left foot: Secondary | ICD-10-CM

## 2017-04-28 DIAGNOSIS — Z89511 Acquired absence of right leg below knee: Secondary | ICD-10-CM

## 2017-04-28 DIAGNOSIS — E119 Type 2 diabetes mellitus without complications: Secondary | ICD-10-CM | POA: Diagnosis not present

## 2017-04-28 DIAGNOSIS — Z7902 Long term (current) use of antithrombotics/antiplatelets: Secondary | ICD-10-CM

## 2017-04-28 DIAGNOSIS — Z9989 Dependence on other enabling machines and devices: Secondary | ICD-10-CM | POA: Diagnosis not present

## 2017-04-28 DIAGNOSIS — Z888 Allergy status to other drugs, medicaments and biological substances status: Secondary | ICD-10-CM

## 2017-04-28 DIAGNOSIS — Z8679 Personal history of other diseases of the circulatory system: Secondary | ICD-10-CM

## 2017-04-28 DIAGNOSIS — I25810 Atherosclerosis of coronary artery bypass graft(s) without angina pectoris: Secondary | ICD-10-CM | POA: Diagnosis not present

## 2017-04-28 DIAGNOSIS — E162 Hypoglycemia, unspecified: Secondary | ICD-10-CM | POA: Diagnosis not present

## 2017-04-28 DIAGNOSIS — Z95810 Presence of automatic (implantable) cardiac defibrillator: Secondary | ICD-10-CM

## 2017-04-28 HISTORY — PX: AMPUTATION: SHX166

## 2017-04-28 HISTORY — DX: Anemia, unspecified: D64.9

## 2017-04-28 HISTORY — DX: Unspecified hemorrhoids: K64.9

## 2017-04-28 HISTORY — DX: Other fecal abnormalities: R19.5

## 2017-04-28 HISTORY — DX: Dyspnea, unspecified: R06.00

## 2017-04-28 HISTORY — DX: Cardiac arrhythmia, unspecified: I49.9

## 2017-04-28 LAB — COMPREHENSIVE METABOLIC PANEL
ALBUMIN: 2 g/dL — AB (ref 3.5–5.0)
ALT: 24 U/L (ref 17–63)
AST: 35 U/L (ref 15–41)
Alkaline Phosphatase: 95 U/L (ref 38–126)
Anion gap: 14 (ref 5–15)
BILIRUBIN TOTAL: 0.5 mg/dL (ref 0.3–1.2)
BUN: 37 mg/dL — AB (ref 6–20)
CHLORIDE: 99 mmol/L — AB (ref 101–111)
CO2: 22 mmol/L (ref 22–32)
Calcium: 8.9 mg/dL (ref 8.9–10.3)
Creatinine, Ser: 6.48 mg/dL — ABNORMAL HIGH (ref 0.61–1.24)
GFR calc Af Amer: 9 mL/min — ABNORMAL LOW (ref >60)
GFR calc non Af Amer: 7 mL/min — ABNORMAL LOW (ref >60)
Glucose, Bld: 316 mg/dL — ABNORMAL HIGH (ref 65–99)
POTASSIUM: 3.9 mmol/L (ref 3.5–5.1)
SODIUM: 135 mmol/L (ref 135–145)
Total Protein: 5.8 g/dL — ABNORMAL LOW (ref 6.5–8.1)

## 2017-04-28 LAB — GLUCOSE, CAPILLARY
GLUCOSE-CAPILLARY: 310 mg/dL — AB (ref 65–99)
GLUCOSE-CAPILLARY: 245 mg/dL — AB (ref 65–99)
GLUCOSE-CAPILLARY: 234 mg/dL — AB (ref 65–99)
Glucose-Capillary: 263 mg/dL — ABNORMAL HIGH (ref 65–99)
Glucose-Capillary: 216 mg/dL — ABNORMAL HIGH (ref 65–99)

## 2017-04-28 LAB — APTT: APTT: 36 s (ref 24–36)

## 2017-04-28 LAB — CBC
HEMATOCRIT: 35.2 % — AB (ref 39.0–52.0)
Hemoglobin: 11.1 g/dL — ABNORMAL LOW (ref 13.0–17.0)
MCH: 29.1 pg (ref 26.0–34.0)
MCHC: 31.5 g/dL (ref 30.0–36.0)
MCV: 92.1 fL (ref 78.0–100.0)
PLATELETS: 276 10*3/uL (ref 150–400)
RBC: 3.82 MIL/uL — ABNORMAL LOW (ref 4.22–5.81)
RDW: 14.9 % (ref 11.5–15.5)
WBC: 8.1 10*3/uL (ref 4.0–10.5)

## 2017-04-28 LAB — HEMOGLOBIN A1C
HEMOGLOBIN A1C: 8.8 % — AB (ref 4.8–5.6)
MEAN PLASMA GLUCOSE: 205.86 mg/dL

## 2017-04-28 LAB — PROTIME-INR
INR: 1.13
Prothrombin Time: 14.4 seconds (ref 11.4–15.2)

## 2017-04-28 SURGERY — AMPUTATION, ABOVE KNEE
Anesthesia: General | Laterality: Right

## 2017-04-28 MED ORDER — CLOPIDOGREL BISULFATE 75 MG PO TABS
75.0000 mg | ORAL_TABLET | Freq: Every day | ORAL | Status: DC
  Administered 2017-04-28 – 2017-05-04 (×7): 75 mg via ORAL
  Filled 2017-04-28 (×7): qty 1

## 2017-04-28 MED ORDER — ONDANSETRON HCL 4 MG/2ML IJ SOLN
INTRAMUSCULAR | Status: DC | PRN
  Administered 2017-04-28: 4 mg via INTRAVENOUS

## 2017-04-28 MED ORDER — METOCLOPRAMIDE HCL 5 MG PO TABS: 5.0000 mg | ORAL_TABLET | Freq: Three times a day (TID) | ORAL | Status: DC | PRN

## 2017-04-28 MED ORDER — NEPRO/CARBSTEADY PO LIQD
237.0000 mL | Freq: Every day | ORAL | Status: DC
  Administered 2017-04-29 – 2017-05-03 (×4): 237 mL via ORAL
  Filled 2017-04-28 (×7): qty 237

## 2017-04-28 MED ORDER — LIDOCAINE HCL (CARDIAC) 20 MG/ML IV SOLN
INTRAVENOUS | Status: DC | PRN
  Administered 2017-04-28: 100 mg via INTRAVENOUS

## 2017-04-28 MED ORDER — MEPERIDINE HCL 25 MG/ML IJ SOLN: 6.2500 mg | INTRAMUSCULAR | Status: DC | PRN

## 2017-04-28 MED ORDER — DARBEPOETIN ALFA 200 MCG/0.4ML IJ SOSY
200.0000 ug | PREFILLED_SYRINGE | INTRAMUSCULAR | Status: DC
  Filled 2017-04-28: qty 0.4

## 2017-04-28 MED ORDER — SODIUM CHLORIDE 0.9 % IV SOLN
INTRAVENOUS | Status: DC | PRN
  Administered 2017-04-28: 500 mL via INTRAVENOUS
  Administered 2017-04-28: 10:00:00 via INTRAVENOUS

## 2017-04-28 MED ORDER — PROPOFOL 10 MG/ML IV BOLUS
INTRAVENOUS | Status: DC | PRN
  Administered 2017-04-28: 130 mg via INTRAVENOUS

## 2017-04-28 MED ORDER — INSULIN ASPART 100 UNIT/ML ~~LOC~~ SOLN
SUBCUTANEOUS | Status: AC
  Filled 2017-04-28: qty 1

## 2017-04-28 MED ORDER — OXYCODONE HCL 5 MG PO TABS: 5.0000 mg | ORAL_TABLET | ORAL | Status: DC | PRN

## 2017-04-28 MED ORDER — CARVEDILOL 3.125 MG PO TABS
ORAL_TABLET | ORAL | Status: AC
  Administered 2017-04-28: 6.25 mg via ORAL
  Filled 2017-04-28: qty 2

## 2017-04-28 MED ORDER — INSULIN ASPART 100 UNIT/ML ~~LOC~~ SOLN
5.0000 [IU] | Freq: Once | SUBCUTANEOUS | Status: AC
  Administered 2017-04-28: 5 [IU] via SUBCUTANEOUS

## 2017-04-28 MED ORDER — DELFLEX-LC/1.5% DEXTROSE 344 MOSM/L IP SOLN
INTRAPERITONEAL | Status: DC
  Administered 2017-04-29 – 2017-05-02 (×2): 5000 mL via INTRAPERITONEAL

## 2017-04-28 MED ORDER — ONDANSETRON HCL 4 MG PO TABS: 4.0000 mg | ORAL_TABLET | Freq: Four times a day (QID) | ORAL | Status: DC | PRN

## 2017-04-28 MED ORDER — HEPARIN 1000 UNIT/ML FOR PERITONEAL DIALYSIS: 500.0000 [IU] | INTRAMUSCULAR | Status: DC | PRN

## 2017-04-28 MED ORDER — ONDANSETRON HCL 4 MG/2ML IJ SOLN
4.0000 mg | Freq: Four times a day (QID) | INTRAMUSCULAR | Status: DC | PRN
  Administered 2017-04-29 – 2017-05-04 (×3): 4 mg via INTRAVENOUS
  Filled 2017-04-28 (×5): qty 2

## 2017-04-28 MED ORDER — METOCLOPRAMIDE HCL 5 MG/ML IJ SOLN: 10.0000 mg | Freq: Once | INTRAMUSCULAR | Status: DC | PRN

## 2017-04-28 MED ORDER — LACTATED RINGERS IV SOLN
INTRAVENOUS | Status: DC | PRN
  Administered 2017-04-28: 10:00:00 via INTRAVENOUS

## 2017-04-28 MED ORDER — PROPOFOL 10 MG/ML IV BOLUS
INTRAVENOUS | Status: AC
  Filled 2017-04-28: qty 20

## 2017-04-28 MED ORDER — OXYCODONE HCL 5 MG PO TABS: 5.0000 mg | ORAL_TABLET | Freq: Four times a day (QID) | ORAL | Status: DC | PRN

## 2017-04-28 MED ORDER — OXYCODONE HCL 5 MG PO TABS
10.0000 mg | ORAL_TABLET | ORAL | Status: DC | PRN
  Administered 2017-04-28 – 2017-05-02 (×11): 10 mg via ORAL
  Filled 2017-04-28 (×11): qty 2

## 2017-04-28 MED ORDER — FENTANYL CITRATE (PF) 250 MCG/5ML IJ SOLN
INTRAMUSCULAR | Status: AC
Start: 2017-04-28 — End: 2017-04-28
  Filled 2017-04-28: qty 5

## 2017-04-28 MED ORDER — EPHEDRINE SULFATE 50 MG/ML IJ SOLN
INTRAMUSCULAR | Status: DC | PRN
  Administered 2017-04-28: 20 mg via INTRAVENOUS
  Administered 2017-04-28: 10 mg via INTRAVENOUS

## 2017-04-28 MED ORDER — MIDAZOLAM HCL 2 MG/2ML IJ SOLN
INTRAMUSCULAR | Status: AC
  Filled 2017-04-28: qty 2

## 2017-04-28 MED ORDER — CALCIUM ACETATE 667 MG PO CAPS: 667.0000 mg | ORAL_CAPSULE | Freq: Three times a day (TID) | ORAL | Status: DC

## 2017-04-28 MED ORDER — CARVEDILOL 6.25 MG PO TABS
6.2500 mg | ORAL_TABLET | Freq: Two times a day (BID) | ORAL | Status: DC
  Administered 2017-04-28: 6.25 mg via ORAL

## 2017-04-28 MED ORDER — INSULIN ASPART 100 UNIT/ML ~~LOC~~ SOLN
3.0000 [IU] | Freq: Three times a day (TID) | SUBCUTANEOUS | Status: DC
  Administered 2017-04-28 – 2017-05-04 (×15): 3 [IU] via SUBCUTANEOUS

## 2017-04-28 MED ORDER — AMIODARONE HCL 100 MG PO TABS
200.0000 mg | ORAL_TABLET | Freq: Two times a day (BID) | ORAL | Status: DC
  Administered 2017-04-28 – 2017-05-04 (×13): 200 mg via ORAL
  Filled 2017-04-28 (×13): qty 2

## 2017-04-28 MED ORDER — HYDROMORPHONE HCL 1 MG/ML IJ SOLN
1.0000 mg | INTRAMUSCULAR | Status: DC | PRN
  Administered 2017-04-28 – 2017-04-29 (×4): 1 mg via INTRAVENOUS
  Filled 2017-04-28 (×4): qty 1

## 2017-04-28 MED ORDER — MAGNESIUM CITRATE PO SOLN: 1.0000 | Freq: Once | ORAL | Status: DC | PRN

## 2017-04-28 MED ORDER — ATORVASTATIN CALCIUM 80 MG PO TABS
80.0000 mg | ORAL_TABLET | Freq: Every day | ORAL | Status: DC
  Administered 2017-04-28 – 2017-05-03 (×5): 80 mg via ORAL
  Filled 2017-04-28 (×6): qty 1

## 2017-04-28 MED ORDER — CALCIUM ACETATE (PHOS BINDER) 667 MG PO CAPS
667.0000 mg | ORAL_CAPSULE | Freq: Three times a day (TID) | ORAL | Status: DC
  Administered 2017-04-28 – 2017-05-04 (×14): 667 mg via ORAL
  Filled 2017-04-28 (×14): qty 1

## 2017-04-28 MED ORDER — METHOCARBAMOL 500 MG PO TABS
500.0000 mg | ORAL_TABLET | Freq: Four times a day (QID) | ORAL | Status: DC | PRN
  Administered 2017-04-28 – 2017-04-30 (×4): 500 mg via ORAL
  Filled 2017-04-28 (×4): qty 1

## 2017-04-28 MED ORDER — METOCLOPRAMIDE HCL 5 MG/ML IJ SOLN: 5.0000 mg | Freq: Three times a day (TID) | INTRAMUSCULAR | Status: DC | PRN

## 2017-04-28 MED ORDER — PRO-STAT SUGAR FREE PO LIQD
30.0000 mL | Freq: Two times a day (BID) | ORAL | Status: DC
  Administered 2017-04-28 – 2017-05-04 (×13): 30 mL via ORAL
  Filled 2017-04-28 (×13): qty 30

## 2017-04-28 MED ORDER — CEFAZOLIN SODIUM-DEXTROSE 2-4 GM/100ML-% IV SOLN
INTRAVENOUS | Status: AC
  Filled 2017-04-28: qty 100

## 2017-04-28 MED ORDER — CARVEDILOL 6.25 MG PO TABS
6.2500 mg | ORAL_TABLET | Freq: Two times a day (BID) | ORAL | Status: DC
  Administered 2017-04-28 – 2017-05-04 (×11): 6.25 mg via ORAL
  Filled 2017-04-28: qty 2
  Filled 2017-04-28 (×11): qty 1

## 2017-04-28 MED ORDER — GENTAMICIN SULFATE 0.1 % EX CREA
1.0000 "application " | TOPICAL_CREAM | Freq: Every day | CUTANEOUS | Status: DC
  Administered 2017-04-28 – 2017-04-30 (×5): 1 via TOPICAL
  Filled 2017-04-28: qty 15

## 2017-04-28 MED ORDER — POLYETHYLENE GLYCOL 3350 17 G PO PACK: 17.0000 g | PACK | Freq: Every day | ORAL | Status: DC | PRN

## 2017-04-28 MED ORDER — INSULIN ASPART 100 UNIT/ML ~~LOC~~ SOLN
0.0000 [IU] | Freq: Three times a day (TID) | SUBCUTANEOUS | Status: DC
  Administered 2017-04-28: 3 [IU] via SUBCUTANEOUS
  Administered 2017-04-29: 7 [IU] via SUBCUTANEOUS
  Administered 2017-04-29: 5 [IU] via SUBCUTANEOUS
  Administered 2017-04-29: 3 [IU] via SUBCUTANEOUS
  Administered 2017-04-30: 2 [IU] via SUBCUTANEOUS
  Administered 2017-04-30: 1 [IU] via SUBCUTANEOUS
  Administered 2017-04-30: 3 [IU] via SUBCUTANEOUS
  Administered 2017-05-01: 2 [IU] via SUBCUTANEOUS
  Administered 2017-05-01: 5 [IU] via SUBCUTANEOUS
  Administered 2017-05-02: 7 [IU] via SUBCUTANEOUS
  Administered 2017-05-02: 2 [IU] via SUBCUTANEOUS
  Administered 2017-05-02: 3 [IU] via SUBCUTANEOUS
  Administered 2017-05-03 (×2): 9 [IU] via SUBCUTANEOUS

## 2017-04-28 MED ORDER — ACETAMINOPHEN 650 MG RE SUPP: 650.0000 mg | RECTAL | Status: DC | PRN

## 2017-04-28 MED ORDER — CHLORHEXIDINE GLUCONATE 4 % EX LIQD: 60.0000 mL | Freq: Once | CUTANEOUS | Status: DC

## 2017-04-28 MED ORDER — RENA-VITE PO TABS
1.0000 | ORAL_TABLET | Freq: Every day | ORAL | Status: DC
Start: 2017-04-28 — End: 2017-05-04
  Administered 2017-04-28 – 2017-05-03 (×6): 1 via ORAL
  Filled 2017-04-28 (×6): qty 1

## 2017-04-28 MED ORDER — DOCUSATE SODIUM 100 MG PO CAPS
100.0000 mg | ORAL_CAPSULE | Freq: Two times a day (BID) | ORAL | Status: DC
  Administered 2017-04-28 – 2017-05-04 (×12): 100 mg via ORAL
  Filled 2017-04-28 (×12): qty 1

## 2017-04-28 MED ORDER — PHENYLEPHRINE HCL 10 MG/ML IJ SOLN
INTRAMUSCULAR | Status: DC | PRN
  Administered 2017-04-28 (×2): 80 ug via INTRAVENOUS

## 2017-04-28 MED ORDER — FENTANYL CITRATE (PF) 100 MCG/2ML IJ SOLN
25.0000 ug | INTRAMUSCULAR | Status: DC | PRN
  Administered 2017-04-28 (×2): 50 ug via INTRAVENOUS

## 2017-04-28 MED ORDER — BISACODYL 10 MG RE SUPP: 10.0000 mg | Freq: Every day | RECTAL | Status: DC | PRN

## 2017-04-28 MED ORDER — 0.9 % SODIUM CHLORIDE (POUR BTL) OPTIME
TOPICAL | Status: DC | PRN
  Administered 2017-04-28: 1000 mL

## 2017-04-28 MED ORDER — ACETAMINOPHEN 325 MG PO TABS
650.0000 mg | ORAL_TABLET | ORAL | Status: DC | PRN
  Administered 2017-04-29: 650 mg via ORAL
  Filled 2017-04-28: qty 2

## 2017-04-28 MED ORDER — HEPARIN 1000 UNIT/ML FOR PERITONEAL DIALYSIS
INTRAPERITONEAL | Status: DC | PRN
  Filled 2017-04-28: qty 5000

## 2017-04-28 MED ORDER — FENTANYL CITRATE (PF) 100 MCG/2ML IJ SOLN
INTRAMUSCULAR | Status: DC | PRN
  Administered 2017-04-28: 100 ug via INTRAVENOUS

## 2017-04-28 MED ORDER — METHOCARBAMOL 1000 MG/10ML IJ SOLN
500.0000 mg | Freq: Four times a day (QID) | INTRAVENOUS | Status: DC | PRN
  Filled 2017-04-28: qty 5

## 2017-04-28 MED ORDER — INSULIN GLARGINE 100 UNIT/ML ~~LOC~~ SOLN
20.0000 [IU] | Freq: Every day | SUBCUTANEOUS | Status: DC
  Administered 2017-04-28 – 2017-05-02 (×5): 20 [IU] via SUBCUTANEOUS
  Filled 2017-04-28 (×6): qty 0.2

## 2017-04-28 MED ORDER — SODIUM CHLORIDE 0.9 % IV SOLN
INTRAVENOUS | Status: DC
  Administered 2017-04-28: 14:00:00 via INTRAVENOUS

## 2017-04-28 MED ORDER — CEFAZOLIN SODIUM-DEXTROSE 2-4 GM/100ML-% IV SOLN
2.0000 g | INTRAVENOUS | Status: AC
  Administered 2017-04-28: 2 g via INTRAVENOUS

## 2017-04-28 MED ORDER — CEFAZOLIN SODIUM-DEXTROSE 1-4 GM/50ML-% IV SOLN
1.0000 g | Freq: Two times a day (BID) | INTRAVENOUS | Status: AC
  Administered 2017-04-28: 1 g via INTRAVENOUS
  Filled 2017-04-28: qty 50

## 2017-04-28 MED ORDER — ASPIRIN EC 81 MG PO TBEC
81.0000 mg | DELAYED_RELEASE_TABLET | Freq: Every day | ORAL | Status: DC
  Administered 2017-04-28 – 2017-05-04 (×7): 81 mg via ORAL
  Filled 2017-04-28 (×7): qty 1

## 2017-04-28 MED ORDER — FENTANYL CITRATE (PF) 100 MCG/2ML IJ SOLN
INTRAMUSCULAR | Status: AC
  Filled 2017-04-28: qty 2

## 2017-04-28 SURGICAL SUPPLY — 33 items
BLADE SAW RECIP 87.9 MT (BLADE) ×2 IMPLANT
BNDG COHESIVE 6X5 TAN STRL LF (GAUZE/BANDAGES/DRESSINGS) ×2 IMPLANT
CANISTER WOUND CARE 500ML ATS (WOUND CARE) ×2 IMPLANT
COVER SURGICAL LIGHT HANDLE (MISCELLANEOUS) ×2 IMPLANT
CUFF TOURNIQUET SINGLE 34IN LL (TOURNIQUET CUFF) IMPLANT
DRAPE INCISE IOBAN 66X45 STRL (DRAPES) ×4 IMPLANT
DRAPE U-SHAPE 47X51 STRL (DRAPES) ×2 IMPLANT
DRESSING PREVENA PLUS CUSTOM (GAUZE/BANDAGES/DRESSINGS) ×1 IMPLANT
DRSG PREVENA PLUS CUSTOM (GAUZE/BANDAGES/DRESSINGS) ×2
DRSG VAC ATS LRG SENSATRAC (GAUZE/BANDAGES/DRESSINGS) ×2 IMPLANT
DURAPREP 26ML APPLICATOR (WOUND CARE) ×2 IMPLANT
ELECT REM PT RETURN 9FT ADLT (ELECTROSURGICAL) ×2
ELECTRODE REM PT RTRN 9FT ADLT (ELECTROSURGICAL) ×1 IMPLANT
GLOVE BIOGEL PI IND STRL 9 (GLOVE) ×4 IMPLANT
GLOVE BIOGEL PI INDICATOR 9 (GLOVE) ×4
GLOVE SURG ORTHO 9.0 STRL STRW (GLOVE) ×8 IMPLANT
GOWN STRL REUS W/ TWL XL LVL3 (GOWN DISPOSABLE) ×4 IMPLANT
GOWN STRL REUS W/TWL XL LVL3 (GOWN DISPOSABLE) ×4
KIT BASIN OR (CUSTOM PROCEDURE TRAY) ×2 IMPLANT
KIT ROOM TURNOVER OR (KITS) ×2 IMPLANT
MANIFOLD NEPTUNE II (INSTRUMENTS) ×2 IMPLANT
NS IRRIG 1000ML POUR BTL (IV SOLUTION) ×2 IMPLANT
PACK ORTHO EXTREMITY (CUSTOM PROCEDURE TRAY) ×2 IMPLANT
PAD ARMBOARD 7.5X6 YLW CONV (MISCELLANEOUS) ×2 IMPLANT
STAPLER VISISTAT 35W (STAPLE) ×2 IMPLANT
STOCKINETTE IMPERVIOUS LG (DRAPES) ×2 IMPLANT
SUT ETHILON 2 0 PSLX (SUTURE) ×4 IMPLANT
SUT SILK 2 0 (SUTURE) ×1
SUT SILK 2 0 TIES 10X30 (SUTURE) ×2 IMPLANT
SUT SILK 2-0 18XBRD TIE 12 (SUTURE) ×1 IMPLANT
TOWEL GREEN STERILE FF (TOWEL DISPOSABLE) ×2 IMPLANT
TUBE CONNECTING 20X1/4 (TUBING) ×2 IMPLANT
YANKAUER SUCT BULB TIP NO VENT (SUCTIONS) ×2 IMPLANT

## 2017-04-28 NOTE — Transfer of Care (Signed)
Immediate Anesthesia Transfer of Care Note  Patient: Joshua KitchenJames D Shipper  Procedure(s) Performed: RIGHT ABOVE KNEE AMPUTATION (Right )  Patient Location: PACU  Anesthesia Type:General  Level of Consciousness: awake  Airway & Oxygen Therapy: Patient Spontanous Breathing and Patient connected to nasal cannula oxygen  Post-op Assessment: Report given to RN and Post -op Vital signs reviewed and stable  Post vital signs: Reviewed and stable  Last Vitals:  Vitals:   04/28/17 0833 04/28/17 1115  BP: (!) 136/46 (!) 128/47  Pulse: 76 69  Resp:  19  Temp: 36.8 C   SpO2: 100% 100%    Last Pain:  Vitals:   04/28/17 1115  TempSrc:   PainSc: (P) 0-No pain      Patients Stated Pain Goal: 3 (04/28/17 [id number])  Complications: No apparent anesthesia complications

## 2017-04-28 NOTE — Op Note (Signed)
04/28/2017  11:13 AM  PATIENT:  Joshua KitchenJames D Giesler    PRE-OPERATIVE DIAGNOSIS:  Gangrene Right Below Knee Amputation  POST-OPERATIVE DIAGNOSIS:  Same  PROCEDURE:  RIGHT ABOVE KNEE AMPUTATION, application Praveena wound VAC.  SURGEON:  Nadara MustardMarcus V Aeneas Longsworth, MD  PHYSICIAN ASSISTANT:None ANESTHESIA:   General  PREOPERATIVE INDICATIONS:  Joshua KitchenJames D Impastato is a  77 y.o. male with a diagnosis of Gangrene Right Below Knee Amputation who failed conservative measures and elected for surgical management.    The risks benefits and alternatives were discussed with the patient preoperatively including but not limited to the risks of infection, bleeding, nerve injury, cardiopulmonary complications, the need for revision surgery, among others, and the patient was willing to proceed.  OPERATIVE IMPLANTS: Praveena wound VAC  OPERATIVE FINDINGS: Minimal petechial bleeding completely calcified femoral vessels  OPERATIVE PROCEDURE: Patient was brought to the operating room and underwent a general anesthetic.  After adequate levels of anesthesia were obtained patient's right lower extremity was prepped using DuraPrep draped into a sterile field the transtibial amputation was draped out of the sterile field with appropriate stocking.  A timeout was called.  A fishmouth incision was made through the distal thigh this was carried sharply down to bone electrocautery was used for hemostasis.  The vascular bundles were clamped and suture-ligated with 2-0 silk.  The amputation was completed with a reciprocating saw.  All tissue was viable and contractile there is no abscess there was minimal petechial bleeding.  The deep and superficial fascial layers and skin was closed using 2-0 nylon a Praveena wound VAC was applied this had a good suction fit patient was extubated taken the PACU in stable condition.   DISCHARGE PLANNING:  Antibiotic duration: 24-hour postoperative antibiotics.  Weightbearing: Nonweightbearing on the  right.  Pain medication: As needed.  Dressing care/ Wound VAC: Continue wound VAC at discharge changing out the hospital VAC to the Praveena plus portable wound VAC pump.  Ambulatory devices: Hoyer lift for transfers.  Discharge to: Home Monday.  Plan for peritoneal dialysis in the hospital.  Follow-up: In the office 1 week post operative.

## 2017-04-28 NOTE — Anesthesia Procedure Notes (Signed)
Procedure Name: LMA Insertion Date/Time: 04/28/2017 10:25 AM Performed by: Rudi RummageLowder, Boomer Winders J, CRNA Pre-anesthesia Checklist: Patient identified, Emergency Drugs available, Suction available, Patient being monitored and Timeout performed Patient Re-evaluated:Patient Re-evaluated prior to induction Oxygen Delivery Method: Circle system utilized Preoxygenation: Pre-oxygenation with 100% oxygen Induction Type: IV induction Ventilation: Mask ventilation without difficulty LMA Size: 4.0 Number of attempts: 1 Placement Confirmation: positive ETCO2 and breath sounds checked- equal and bilateral Tube secured with: Tape Dental Injury: Teeth and Oropharynx as per pre-operative assessment

## 2017-04-28 NOTE — H&P (Signed)
Joshua KitchenJames D Beauchamp is an 77 y.o. male.   Chief Complaint: Gangrene dehiscence right transtibial amputation. HPI: Patient is a 77 year old gentleman status post bilateral transtibial amputations who has a well-healed left transtibial amputation and has progressive gangrenous dehiscence of the right transtibial amputation despite good hair growth on the leg.  Patient states that he is not interested in any type of prosthesis he wants to use his electric wheelchair does not want to go through the therapy    Past Medical History:  Diagnosis Date  . AICD (automatic cardioverter/defibrillator) present    [employer] [phone] lead [id number]  . Anemia   . Arthritis   . CHF (congestive heart failure) (HCC)   . Complication of anesthesia   . COPD (chronic obstructive pulmonary disease) (HCC)   . Coronary artery disease    CABG 2011  . Diabetes mellitus without complication (HCC)   . Dyspnea   . Dysrhythmia   . ESRD on peritoneal dialysis [facility](HCC)    [place] Cy[place]er- at night  . Foot infection 01/06/2017  . Gangrene (HCC)    left knee  . Gangrene of left foot (HCC) 11/26/2016  . Gangrene of right foot (HCC) 02/23/2017  . GERD (gastroesophageal reflux disease)   . Hemorrhoids   . History of peritonitis    PD cath related peritonitis in April 2017  . History of transmetatarsal amputation of left foot (HCC) 12/27/2016  . Hypertension   . Occult blood in stools 04/2017  . Peripheral vascular disease (HCC)   . PONV (postoperative nausea and vomiting)   . Sjogren's syndrome (HCC)    Per pt's wife, was diagnosed in 2016 during w/u for cause of renal failure prior to starting dialysis.  He had a rash on his chest apparently prompting this w/u.  Never had renal bx.  He [place] in [place] and per the wife he was treated with MTX and other medications but had side effects to "all of it" and isn't taking anything for this as  of Jun 2017.   [name]. Sleep apnea    wears Bipap  . Stroke (HCC) 2016  . Wound dehiscence    Right foot    Past Surgical History:  Procedure Laterality Date  . ABDOMINAL AORTOGRAM W/LOWER EXTREMITY N/A 11/29/2016   Procedure: ABDOMINAL AORTOGRAM W/LOWER EXTREMITY;  Surgeon: Nada LibmanBrabham, Vance W, MD;  [facility];  Service: Cardiovascular;  Laterality: N/A;  . AMPUTATION Left 11/29/2016   Procedure: LEFT THIRD TOE AMPUTATION;  Surgeon: Nada LibmanBrabham, Vance W, MD;  [facility];  Service: Vascular;  Laterality: Left;  . AMPUTATION Left 12/23/2016   Procedure: Left Transmetatarsal Amputation;  Surgeon: [name], Marcus V, MD;  [facility];  Service: Orthopedics;  Laterality: Left;  . AMPUTATION Left 01/11/2017   Procedure: LEFT BELOW KNEE AMPUTATION;  Surgeon: [name], Marcus V, MD;  [facility];  Service: Orthopedics;  Laterality: Left;  . AMPUTATION Right 01/20/2017   Procedure: AMPUTATION RIGHT FIFTH TOE;  Surgeon: Nada LibmanBrabham, Vance W, MD;  [facility];  Service: Vascular;  Laterality: Right;  . AMPUTATION Right 03/01/2017   Procedure: RIGHT BELOW KNEE AMPUTATION;  Surgeon: [name], Marcus V, MD;  [facility];  Service: Orthopedics;  Laterality: Right;  . ANGIOPLASTY  12/15/2016   Procedure: BALLOON ANGIOPLASTY;  Surgeon: Maeola Harmanain, Brandon Christopher, MD;  [facility];  Service: Vascular;;  . cardiac catherization with stent placement  2017  . COLONOSCOPY W/ BIOPSIES AND POLYPECTOMY    . CORONARY ARTERY BYPASS GRAFT  2011  . FALSE ANEURYSM REPAIR Right 12/15/2016   Procedure: REPAIR FALSE ANEURYSM;  Surgeon: Maeola Harman, MD;  Location: [facility] OR;  Service: Vascular;  Laterality: Right;  . IRRIGATION AND DEBRIDEMENT FOOT Left 12/15/2016   Procedure: IRRIGATION AND DEBRIDEMENT FOOT;  Surgeon: Maeola Harman, MD;  Location: [facility] OR;  Service: Vascular;  Laterality: Left;  . LEFT HEART CATH AND CORS/GRAFTS ANGIOGRAPHY N/A 01/09/2017   Procedure: LEFT HEART CATH AND  CORS/GRAFTS ANGIOGRAPHY;  Surgeon: Runell Gess, MD;  [facility];  Service: Cardiovascular;  Laterality: N/A;  . LOWER EXTREMITY ANGIOGRAM Left 12/15/2016   Procedure: LOWER EXTREMITY ANGIOGRAM; PEDAL ACCESS;  Surgeon: Maeola Harman, MD;  [facility];  Service: Vascular;  Laterality: Left;  . PERIPHERAL VASCULAR BALLOON ANGIOPLASTY  11/29/2016   Procedure: PERIPHERAL VASCULAR BALLOON ANGIOPLASTY;  Surgeon: Nada Libman, MD;  [facility];  Service: Cardiovascular;;  LT Peroneal AT attempted unsuccessful  . SIGMOIDECTOMY    . STUMP REVISION Left 03/01/2017   Procedure: REVISION LEFT BELOW KNEE AMPUTATION;  Surgeon: Nadara Mustard, MD;  Location: [facility] OR;  Service: Orthopedics;  Laterality: Left;    Family History  Problem Relation Age of Onset  . Heart disease Father   . Diabetes Mother    Social History:  reports that he has quit smoking. His smoking use included pipe. he has never used smokeless tobacco. He reports that he does not drink alcohol or use drugs.  Allergies:  Allergies  Allergen Reactions  . Sulfa Antibiotics Hives  . Vancomycin Other (See Comments)    Blisters   . Zanaflex [Tizanidine Hcl] Other (See Comments)    Hypotensive stroke  . Gabapentin (Once-Daily) Hypertension  . Lisinopril Hives  . Methotrexate Derivatives Other (See Comments)    blisters  . Tape Hives  . Flexeril [Cyclobenzaprine] Other (See Comments)    somnolence    No medications prior to admission.    No results found for this or any previous visit (from the past 48 hour(s)). No results found.  Review of Systems  All other systems reviewed and are negative.   There were no vitals taken for this visit. Physical Exam  On examination patient is alert oriented no adenopathy well-dressed normal affect normal respiratory effort.  Patient has a well-healed left transtibial amputation right transtibial amputation shows progressive  dehiscence. Assessment/Plan Assessment: Status post bilateral transtibial amputation with wound dehiscence of the right transtibial amputation.  Plan: We will plan for a right above-the-knee amputation.  Risks and benefits were discussed including risk of the wound not healing.  Patient states he understands wished to proceed at this time.  Nadara Mustard, MD 04/28/2017, 6:31 AM

## 2017-04-28 NOTE — Anesthesia Preprocedure Evaluation (Signed)
Anesthesia Evaluation  Patient identified by MRN, date of birth, ID band Patient awake    Reviewed: Allergy & Precautions, NPO status , Patient's Chart, lab work & pertinent test results, reviewed documented beta blocker date and time   History of Anesthesia Complications Negative for: history of anesthetic complications  Airway Mallampati: IV  TM Distance: >3 FB Neck ROM: Full    Dental  (+) Chipped, Dental Advisory Given   Pulmonary sleep apnea (BiPAP) and Continuous Positive Airway Pressure Ventilation , COPD, former smoker,    breath sounds clear to auscultation       Cardiovascular hypertension, Pt. on medications and Pt. on home beta blockers + CAD, + CABG, + Peripheral Vascular Disease and +CHF  + Cardiac Defibrillator  Rhythm:Regular Rate:Normal  EKG 12/2016 - 1st deg AVB with PVCs, septal Q waves  10/14/16 ECHO ([facility]): MODERATE LV DYSFUNCTION ( 35% EF) WITH MILD LVH NORMAL RIGHT VENTRICULAR SYSTOLIC FUNCTION VALVULAR REGURGITATION: TRIVIAL AR, TRIVIAL MR, TRIVIAL PR, TRIVIAL TR NO VALVULAR STENOSIS  AICD - [employer] SQ EMBLEM S-ICD 45CM - [id number] [employer] EMBLEM S-ICD MRI PULSE GENERATOR - [id number]   Neuro/Psych CVA, No Residual Symptoms    GI/Hepatic negative GI ROS, Neg liver ROS,   Endo/Other  diabetes, Insulin DependentSjogren's, obesity  Renal/GU Dialysis and ESRFRenal disease (peritoneal)     Musculoskeletal Sjogren's   Abdominal (+) + obese,   Peds  Hematology  (+) Blood dyscrasia (Hb 10.5), ,   Anesthesia Other Findings   Reproductive/Obstetrics                            Anesthesia Physical  Anesthesia Plan  ASA: III  Anesthesia Plan: General   Post-op Pain Management:    Induction: Intravenous  PONV Risk Score and Plan: 2 and Ondansetron, Treatment may vary due to age or medical condition, Propofol infusion and Dexamethasone  Airway Management  Planned: LMA  Additional Equipment: None  Intra-op Plan:   Post-operative Plan:   Informed Consent: I have reviewed the patients History and Physical, chart, labs and discussed the procedure including the risks, benefits and alternatives for the proposed anesthesia with the patient or authorized representative who has indicated his/her understanding and acceptance.   Dental advisory given  Plan Discussed with: CRNA and Surgeon  Anesthesia Plan Comments: ( )        Anesthesia Quick Evaluation  

## 2017-04-28 NOTE — Progress Notes (Signed)
Inpatient Diabetes Program Recommendations  AACE/ADA: New Consensus Statement on Inpatient Glycemic Control (2015)  Target Ranges:  Prepandial:   less than 140 mg/dL      Peak postprandial:   less than 180 mg/dL (1-2 hours)      Critically ill patients:  140 - 180 mg/dL   Lab Results  Component Value Date   GLUCAP 310 (H) 04/28/2017   HGBA1C 8.8 (H) 04/28/2017   Review of Glycemic Control  Diabetes history: DM 2 Outpatient Diabetes medications: NPH 30 units BID, Novolog 0-8 units tid with meals Current orders for Inpatient glycemic control: Currently in OR  Inpatient Diabetes Program Recommendations:    Patient currently in OR, In previous admission insulin regimen consisted of NPH 25 units BID and Novolog 0-9 units tid + Novolog HS scale 0-5 units.  Please consider NPH 20 units BID until patient starts eating, then titrate as needed. Also consider Novolog Sensitive Correction 0-9 units tid + Novolog HS scale 0-5 units.  Thanks,  Christena DeemShannon Shaniqua Guillot RN, MSN, [facility] Inpatient Diabetes Coordinator Team Pager [phone] (8a-5p)

## 2017-04-28 NOTE — Progress Notes (Signed)
Patient ID: Joshua [name], male   DOB: [birth year], 77 y.o.   MRN: [id number] Plan for discharge to home on Monday.  Peritoneal dialysis during the hospital stay.  Erin to discharge on Monday with the hospital VAC changed over to the portable Praveena VAC.  Follow-up in the office in 1 week.

## 2017-04-28 NOTE — Consult Note (Signed)
Reason for Consult: To manage dialysis and dialysis related needs Referring Physician: Dr. [name] is an 77 y.o. male.  HPI: Pt is a 71M with  PMH significant for HTN, CAD s/p CABG, HLD, DM II, ESRD, PAD s/p R and L BKA who we are now seeing at the request of [name] for evaluation and recs surrounding ESRD and PD.    Pt underwent R AKA due to wound dehiscence today.  He is feeling expected post- op pain.    He was actually discharged from [facility] yesterday due to hypotension thought to be due to dehydration.  No source of infection found.  Was using all 1.5% in the hospital.    Center: [facility] therapieson CCPD. 6 exchanges/day one mid-day exchange (occurs around 9am).  2.5 liters of 2.5% per CCPD 2 liters of 2.5% for last fill and mid day drain. 1 hr 20 min dwell time Dr. Lindalou [name].     Past Medical History:  Diagnosis Date  . AICD (automatic cardioverter/defibrillator) present    [employer] [phone] lead [id number]  . Anemia   . Arthritis   . CHF (congestive heart failure) ([place])   . Complication of anesthesia   . COPD (chronic obstructive pulmonary disease) ([place])   . Coronary artery disease    CABG 2011  . Diabetes mellitus without complication ([place])   . Dyspnea   . Dysrhythmia   . ESRD on peritoneal dialysis [facility])    Started PD Dec 2016 in [place]. Nephrology is in [place]. CyCler- at night  . Foot infection 01/06/2017  . Gangrene ([place])    left knee  . Gangrene of left foot ([place]) 11/26/2016  . Gangrene of right foot ([place]) 02/23/2017  . GERD (gastroesophageal reflux disease)   . Hemorrhoids   . History of peritonitis    PD cath related peritonitis in April 2017  . History of transmetatarsal amputation of left foot ([place]) 12/27/2016  . Hypertension   . Occult blood in stools 04/2017  . Peripheral vascular disease ([place])   . PONV (postoperative nausea and vomiting)   . Sjogren's syndrome ([place])    Per pt's wife, was diagnosed  in 2016 during w/u for cause of renal failure prior to starting dialysis.  He had a rash on his chest apparently prompting this w/u.  Never had renal bx.  He was referred to a rheum MD in [place] and per the wife he was treated with MTX and other medications but had side effects to "all of it" and isn't taking anything for this as of Jun 2017.   [name] Sleep apnea    wears Bipap  . Stroke ([place]) 2016  . Wound dehiscence    Right foot    Past Surgical History:  Procedure Laterality Date  . ABDOMINAL AORTOGRAM W/LOWER EXTREMITY N/A 11/29/2016   Procedure: ABDOMINAL AORTOGRAM W/LOWER EXTREMITY;  Surgeon: Serafina Mitchell, MD;  Location: [place] CV LAB;  Service: Cardiovascular;  Laterality: N/A;  . AMPUTATION Left 11/29/2016   Procedure: LEFT THIRD TOE AMPUTATION;  Surgeon: Serafina Mitchell, MD;  Location: [facility] OR;  Service: Vascular;  Laterality: Left;  . AMPUTATION Left 12/23/2016   Procedure: Left Transmetatarsal Amputation;  Surgeon: Newt Minion, MD;  Location: [place];  Service: Orthopedics;  Laterality: Left;  . AMPUTATION Left 01/11/2017   Procedure: LEFT BELOW KNEE AMPUTATION;  Surgeon: Newt Minion, MD;  Location: [place];  Service: Orthopedics;  Laterality: Left;  .  AMPUTATION Right 01/20/2017   Procedure: AMPUTATION RIGHT FIFTH TOE;  Surgeon: Serafina Mitchell, MD;  Location: [place];  Service: Vascular;  Laterality: Right;  . AMPUTATION Right 03/01/2017   Procedure: RIGHT BELOW KNEE AMPUTATION;  Surgeon: Newt Minion, MD;  Location: [place];  Service: Orthopedics;  Laterality: Right;  . ANGIOPLASTY  12/15/2016   Procedure: BALLOON ANGIOPLASTY;  Surgeon: Waynetta Sandy, MD;  Location: [place];  Service: Vascular;;  . cardiac catherization with stent placement  2017  . COLONOSCOPY W/ BIOPSIES AND POLYPECTOMY    . CORONARY ARTERY BYPASS GRAFT  2011  . FALSE ANEURYSM REPAIR Right 12/15/2016   Procedure: REPAIR FALSE ANEURYSM;  Surgeon: Waynetta Sandy, MD;  Location: [place];   Service: Vascular;  Laterality: Right;  . IRRIGATION AND DEBRIDEMENT FOOT Left 12/15/2016   Procedure: IRRIGATION AND DEBRIDEMENT FOOT;  Surgeon: Waynetta Sandy, MD;  Location: [place];  Service: Vascular;  Laterality: Left;  . LEFT HEART CATH AND CORS/GRAFTS ANGIOGRAPHY N/A 01/09/2017   Procedure: LEFT HEART CATH AND CORS/GRAFTS ANGIOGRAPHY;  Surgeon: Lorretta Harp, MD;  Location: [place] [facility];  Service: Cardiovascular;  Laterality: N/A;  . LOWER EXTREMITY ANGIOGRAM Left 12/15/2016   Procedure: LOWER EXTREMITY ANGIOGRAM; PEDAL ACCESS;  Surgeon: Waynetta Sandy, MD;  Location: [place];  Service: Vascular;  Laterality: Left;  . PERIPHERAL VASCULAR BALLOON ANGIOPLASTY  11/29/2016   Procedure: PERIPHERAL VASCULAR BALLOON ANGIOPLASTY;  Surgeon: Serafina Mitchell, MD;  Location: [place] [facility] LAB;  Service: Cardiovascular;;  LT Peroneal AT attempted unsuccessful  . SIGMOIDECTOMY    . STUMP REVISION Left 03/01/2017   Procedure: REVISION LEFT BELOW KNEE AMPUTATION;  Surgeon: Newt Minion, MD;  Location: [place];  Service: Orthopedics;  Laterality: Left;    Family History  Problem Relation Age of Onset  . Heart disease Father   . Diabetes Mother     Social History:  reports that he has quit smoking. His smoking use included pipe. he has never used smokeless tobacco. He reports that he does not drink alcohol or use drugs.  Allergies:  Allergies  Allergen Reactions  . Sulfa Antibiotics Hives  . Vancomycin Other (See Comments)    Blisters   . Zanaflex [Tizanidine Hcl] Other (See Comments)    Hypotensive stroke  . Gabapentin (Once-Daily) Hypertension  . Lisinopril Hives  . Methotrexate Derivatives Other (See Comments)    blisters  . Tape Hives  . Flexeril [Cyclobenzaprine] Other (See Comments)    somnolence    Medications:  Scheduled: . amiodarone  200 mg Oral BID  . aspirin EC  81 mg Oral Daily  . atorvastatin  80 mg Oral q1800  . calcium acetate  667 mg Oral  TID WC  . carvedilol  6.25 mg Oral BID WC  . clopidogrel  75 mg Oral Daily  . [START ON 04/29/2017] Darbepoetin Alfa  200 mcg Subcutaneous Q Sat-1800  . docusate sodium  100 mg Oral BID  . feeding supplement (NEPRO CARB STEADY)  237 mL Oral QHS  . feeding supplement (PRO-STAT SUGAR FREE 64)  30 mL Oral BID  . fentaNYL      . gentamicin cream  1 application Topical Daily  . insulin aspart      . insulin aspart  0-9 Units Subcutaneous TID WC  . insulin aspart  3 Units Subcutaneous TID WC  . insulin glargine  20 Units Subcutaneous QHS  . multivitamin  1 tablet Oral QHS  Results for orders placed or performed during the hospital encounter of 04/28/17 (from the past 48 hour(s))  APTT     Status: None   Collection Time: 04/28/17  8:00 AM  Result Value Ref Range   aPTT 36 24 - 36 seconds  CBC     Status: Abnormal   Collection Time: 04/28/17  8:00 AM  Result Value Ref Range   WBC 8.1 4.0 - 10.5 K/uL   RBC 3.82 (L) 4.22 - 5.81 MIL/uL   Hemoglobin 11.1 (L) 13.0 - 17.0 g/dL   HCT 35.2 (L) 39.0 - 52.0 %   MCV 92.1 78.0 - 100.0 fL   MCH 29.1 26.0 - 34.0 pg   MCHC 31.5 30.0 - 36.0 g/dL   RDW 14.9 11.5 - 15.5 %   Platelets 276 150 - 400 K/uL  Comprehensive metabolic panel     Status: Abnormal   Collection Time: 04/28/17  8:00 AM  Result Value Ref Range   Sodium 135 135 - 145 mmol/L   Potassium 3.9 3.5 - 5.1 mmol/L   Chloride 99 (L) 101 - 111 mmol/L   CO2 22 22 - 32 mmol/L   Glucose, Bld 316 (H) 65 - 99 mg/dL   BUN 37 (H) 6 - 20 mg/dL   Creatinine, Ser 6.48 (H) 0.61 - 1.24 mg/dL   Calcium 8.9 8.9 - 10.3 mg/dL   Total Protein 5.8 (L) 6.5 - 8.1 g/dL   Albumin 2.0 (L) 3.5 - 5.0 g/dL   AST 35 15 - 41 U/L   ALT 24 17 - 63 U/L   Alkaline Phosphatase 95 38 - 126 U/L   Total Bilirubin 0.5 0.3 - 1.2 mg/dL   GFR calc non Af Amer 7 (L) >60 mL/min   GFR calc Af Amer 9 (L) >60 mL/min    Comment: (NOTE) The eGFR has been calculated using the CKD EPI equation. This calculation has not been  validated in all clinical situations. eGFR's persistently <60 mL/min signify possible Chronic Kidney Disease.    Anion gap 14 5 - 15  Protime-INR     Status: None   Collection Time: 04/28/17  8:00 AM  Result Value Ref Range   Prothrombin Time 14.4 11.4 - 15.2 seconds   INR 1.13   Hemoglobin A1c     Status: Abnormal   Collection Time: 04/28/17  8:10 AM  Result Value Ref Range   Hgb A1c MFr Bld 8.8 (H) 4.8 - 5.6 %    Comment: (NOTE) Pre diabetes:          5.7%-6.4% Diabetes:              >6.4% Glycemic control for   <7.0% adults with diabetes    Mean Plasma Glucose 205.86 mg/dL  Glucose, capillary     Status: Abnormal   Collection Time: 04/28/17  8:21 AM  Result Value Ref Range   Glucose-Capillary 310 (H) 65 - 99 mg/dL  Glucose, capillary     Status: Abnormal   Collection Time: 04/28/17  9:38 AM  Result Value Ref Range   Glucose-Capillary 263 (H) 65 - 99 mg/dL  Glucose, capillary     Status: Abnormal   Collection Time: 04/28/17 11:16 AM  Result Value Ref Range   Glucose-Capillary 245 (H) 65 - 99 mg/dL   Comment 1 Notify RN     No results found.  ROS: all other systems reviewed and are negative except as per HPI  Blood pressure (!) 134/46, pulse 67, temperature 97.8 F (36.6  C), temperature source Oral, resp. rate 17, height '5\' 9"'  (1.753 m), weight 72.6 kg (160 lb), SpO2 98 %. .  GEN older gentleman, NAD HEENT EOMI, slightly disconjugate gaze NECK no JVD PULM clear bilaterally CV RRR no m/r/g ABD soft nontender, PD cath c/d/i EXT s/p L BKA and R AKA in woundvac NEURO AAO x 3  Assessment/Plan: 1 Wound dehiscence: s/p R AKA with [name] 04/28/17.  Plan is to go home Monday with woundvac 2 ESRD: Will do PD--> increasing exchanges in cycler and leaving off mid-day exchange.  All 1.5% for now 3 Hypertension: well controlled, on Coreg  4. Anemia of ESRD: Hgb 11.1, will follow and add ESA as appropriate.  Expect to fall postop 5. Metabolic Bone Disease: Calcium acetate  as binder 6.  Dispo: plan for home on Monday  Madelon Lips 04/28/2017, 3:53 PM

## 2017-04-28 NOTE — Anesthesia Postprocedure Evaluation (Signed)
Anesthesia Post Note  Patient: Joshua [name]  Procedure(s) Performed: RIGHT ABOVE KNEE AMPUTATION (Right )     Patient location during evaluation: PACU Anesthesia Type: General Level of consciousness: awake and alert Pain management: pain level controlled Vital Signs Assessment: post-procedure vital signs reviewed and stable Respiratory status: spontaneous breathing, nonlabored ventilation, respiratory function stable and patient connected to nasal cannula oxygen Cardiovascular status: blood pressure returned to baseline and stable Postop Assessment: no apparent nausea or vomiting Anesthetic complications: no    Last Vitals:  Vitals:   04/28/17 1226 04/28/17 1243  BP: (!) 128/55 (!) 134/46  Pulse: (!) 45 67  Resp: 17 17  Temp: 36.6 C 36.6 C  SpO2: 98% 98%    Last Pain:  Vitals:   04/28/17 1243  TempSrc: Oral  PainSc:                  Phillips Groutarignan, Beni Turrell

## 2017-04-29 ENCOUNTER — Encounter (HOSPITAL_COMMUNITY): Payer: Self-pay | Admitting: Orthopedic Surgery

## 2017-04-29 LAB — GLUCOSE, CAPILLARY
GLUCOSE-CAPILLARY: 266 mg/dL — AB (ref 65–99)
GLUCOSE-CAPILLARY: 239 mg/dL — AB (ref 65–99)
GLUCOSE-CAPILLARY: 218 mg/dL — AB (ref 65–99)
Glucose-Capillary: 345 mg/dL — ABNORMAL HIGH (ref 65–99)

## 2017-04-29 NOTE — Evaluation (Signed)
Physical Therapy Evaluation Patient Details Name: Joshua KitchenJames D Huron MRN: [id number] DOB: 03/04/1941 Today's Date: 04/29/2017   History of Present Illness  Pt is a 77 y.o. male admitted for further amputation of his right limb. 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  He also has a BKA on the left.  Has complicated PMH including ESRD, ischemica cardiomyopathy, HTN, DM, PVD s/p AICD placement.   Clinical Impression  Patient presents with decreased ability to transfer compared to baseline. He was limited by pain and fatigue today. He was unable to scoot using slideborad despite max a. At baseline he was able to transfer with the help of his wife. He may benefit from Inpt rehab or rehab at a SNF to improve his ability to transfer.  Acute PT will continue to work with him while admitted.   Follow Up Recommendations CIR;SNF(Patient has been to CIR in the past )    Equipment Recommendations  Rolling walker with 5" wheels    Recommendations for Other Services Rehab consult     Precautions / Restrictions Precautions Precautions: Fall Restrictions Weight Bearing Restrictions: Yes RLE Weight Bearing: Non weight bearing      Mobility  Bed Mobility Overal bed mobility: Needs Assistance Bed Mobility: Supine to Sit;Sit to Supine     Supine to sit: Mod assist Sit to supine: Mod assist      Transfers Overall transfer level: Needs assistance Equipment used: (slide board ) Transfers: Lateral/Scoot Transfers          Lateral/Scoot Transfers: Max assist General transfer comment: unable to transfer using the slide board today. Patient was unable to scoot 2nd to pain and weakness.   Ambulation/Gait                Stairs            Wheelchair Mobility    Modified Rankin (Stroke Patients Only)       Balance Overall balance assessment: Needs assistance Sitting-balance support: Bilateral upper extremity supported Sitting balance-Leahy Scale: Poor Sitting  balance - Comments: mod a required for sitting balance                                      Pertinent Vitals/Pain Pain Assessment: Faces Faces Pain Scale: Hurts whole lot Pain Location: right residual limb  Pain Descriptors / Indicators: Aching;Constant Pain Intervention(s): Limited activity within patient's tolerance    Home Living Family/patient expects to be discharged to:: Private residence Living Arrangements: Spouse/significant other Available Help at Discharge: Family;Available 24 hours/day Type of Home: Mobile home Home Access: Ramped entrance     Home Layout: One level   Additional Comments: Wife was assisting him at home prior to new surgery     Prior Function Level of Independence: Needs assistance   Gait / Transfers Assistance Needed: does sliding board transfers to electric  w/c  ADL's / Homemaking Assistance Needed: A for socks, can bath self in shower        Hand Dominance   Dominant Hand: Right    Extremity/Trunk Assessment   Upper Extremity Assessment Upper Extremity Assessment: Overall WFL for tasks assessed    Lower Extremity Assessment Lower Extremity Assessment: (can move both residual limbs )    Cervical / Trunk Assessment Cervical / Trunk Assessment: Normal  Communication   Communication: No difficulties  Cognition Arousal/Alertness: Awake/alert Behavior During Therapy: WFL for tasks assessed/performed Overall  Cognitive Status: Within Functional Limits for tasks assessed                                        General Comments General comments (skin integrity, edema, etc.): wond vac on right residual limb. Right residual limb sits in hip flexion wihen supine.     Exercises     Assessment/Plan    PT Assessment Patient needs continued PT services  PT Problem List Decreased strength;Decreased range of motion;Decreased activity tolerance;Decreased balance;Decreased mobility;Pain;Decreased knowledge of  use of DME       PT Treatment Interventions DME instruction;Functional mobility training;Therapeutic activities;Therapeutic exercise;Balance training;Patient/family education    PT Goals (Current goals can be found in the Care Plan section)  Acute Rehab PT Goals Patient Stated Goal: to be able to transfer to his wheelchair  PT Goal Formulation: With patient/family Time For Goal Achievement: 05/13/17 Potential to Achieve Goals: Good    Frequency Min 4X/week   Barriers to discharge Decreased caregiver support wife may not be able to help him transfer at this time 2nd to amount of assit required     Co-evaluation               AM-PAC PT "6 Clicks" Daily Activity  Outcome Measure Difficulty turning over in bed (including adjusting bedclothes, sheets and blankets)?: A Lot Difficulty moving from lying on back to sitting on the side of the bed? : A Lot Difficulty sitting down on and standing up from a chair with arms (e.g., wheelchair, bedside commode, etc,.)?: A Lot Help needed moving to and from a bed to chair (including a wheelchair)?: Total Help needed walking in hospital room?: Total Help needed climbing 3-5 steps with a railing? : Total 6 Click Score: 9    End of Session Equipment Utilized During Treatment: Gait belt Activity Tolerance: Patient limited by pain;Patient limited by fatigue Patient left: in bed Nurse Communication: Mobility status PT Visit Diagnosis: Muscle weakness (generalized) (M62.81);Difficulty in walking, not elsewhere classified (R26.2);Pain Pain - Right/Left: Right Pain - part of body: Leg    Time: 1030-1050 PT Time Calculation (min) (ACUTE ONLY): 20 min   Charges:   PT Evaluation $PT Eval Moderate Complexity: 1 Mod     PT G Codes:         Dessie Coma PT DPT  04/29/2017, 11:41 AM

## 2017-04-29 NOTE — Progress Notes (Signed)
   Subjective:  Patient reports pain as moderate.    Objective:   VITALS:   Vitals:   04/28/17 1608 04/28/17 2053 04/28/17 2341 04/29/17 0344  BP: (!) 127/55 (!) 160/59 133/60 137/60  Pulse: 65 80 88 91  Resp: 18 18 18 18   Temp: (!) 97 F (36.1 C) 97.7 F (36.5 C) 97.7 F (36.5 C) 98.6 F (37 C)  TempSrc: Oral Oral Oral Oral  SpO2: 98% 100% 100% 100%  Weight:      Height:        Vac with good seal and suction on right AKA stump   Lab Results  Component Value Date   WBC 8.1 04/28/2017   HGB 11.1 (L) 04/28/2017   HCT 35.2 (L) 04/28/2017   MCV 92.1 04/28/2017   PLT 276 04/28/2017     Assessment/Plan:  1 Day Post-Op   - Expected postop acute blood loss anemia - will monitor for symptoms - Up with PT/OT - DVT ppx - SCDs, ambulation, aspirin, plavix - NWB operative extremity - Pain control - Discharge planning - to be discharged home on Monday with prevena  Glee ArvinMichael Xu 04/29/2017, 9:21 AM [phone]

## 2017-04-29 NOTE — Progress Notes (Signed)
Pd tx completed overnight.  Per patient and wife, machine alarming frequently for line occlusion and scale error.  Unable to switch machines as all cyclers are currently in use.  Treatment time 5 hours longer than anticipated based on fill, drain, and dwell times prescribed.  UF 116 mL  Catheter capped, pinned, clamped, and secured to patient.  Will continue to monitor.

## 2017-04-29 NOTE — Progress Notes (Signed)
  [facility] Progress Note   Assessment/ Plan:   1 Wound dehiscence: s/p R AKA with [name] 04/28/17.  Plan is to go home Monday with woundvac  2 ESRD: Will do PD--> increasing exchanges in cycler and leaving off mid-day exchange.  All 1.5% for now.  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  Dispo: plan for home on Monday    Subjective:    Having some expected postoperative pain.     Objective:   BP 137/60 (BP Location: Right Arm)   Pulse 91   Temp 98.6 F (37 C) (Oral)   Resp 18   Ht 5\' 9"  (1.753 m)   Wt 72.6 kg (160 lb)   SpO2 100%   BMI 23.63 kg/m   Physical Exam: GEN older gentleman, appears uncomfortable HEENT EOMI, slightly disconjugate gaze NECK no JVD PULM clear bilaterally CV RRR no m/r/g ABD soft nontender, PD cath c/d/i EXT s/p L BKA and R AKA in woundvac, minimal serosang discharge NEURO AAO x 3    Labs: BMET Recent Labs  Lab 04/28/17 0800  NA 135  K 3.9  CL 99*  CO2 22  GLUCOSE 316*  BUN 37*  CREATININE 6.48*  CALCIUM 8.9   CBC Recent Labs  Lab 04/28/17 0800  WBC 8.1  HGB 11.1*  HCT 35.2*  MCV 92.1  PLT 276    @IMGRELPRIORS @ Medications:    . amiodarone  200 mg Oral BID  . aspirin EC  81 mg Oral Daily  . atorvastatin  80 mg Oral q1800  . calcium acetate  667 mg Oral TID WC  . carvedilol  6.25 mg Oral BID WC  . clopidogrel  75 mg Oral Daily  . Darbepoetin Alfa  200 mcg Subcutaneous Q Sat-1800  . docusate sodium  100 mg Oral BID  . feeding supplement (NEPRO CARB STEADY)  237 mL Oral QHS  . feeding supplement (PRO-STAT SUGAR FREE 64)  30 mL Oral BID  . gentamicin cream  1 application Topical Daily  . insulin aspart  0-9 Units Subcutaneous TID WC  . insulin aspart  3 Units Subcutaneous TID WC  . insulin glargine   20 Units Subcutaneous QHS  . multivitamin  1 tablet Oral QHS     Bufford ButtnerElizabeth Tesslyn Baumert, MD [facility] pgr [phone] 04/29/2017, 1:01 PM

## 2017-04-30 LAB — GLUCOSE, CAPILLARY
GLUCOSE-CAPILLARY: 193 mg/dL — AB (ref 65–99)
GLUCOSE-CAPILLARY: 142 mg/dL — AB (ref 65–99)
GLUCOSE-CAPILLARY: 151 mg/dL — AB (ref 65–99)
Glucose-Capillary: 241 mg/dL — ABNORMAL HIGH (ref 65–99)

## 2017-04-30 NOTE — Progress Notes (Signed)
Physical Therapy Treatment Patient Details Name: Joshua [name] MRN: [id number] DOB: 1941/02/02 Today's Date: 04/30/2017    History of Present Illness Pt is a 77 y.o. male admitted for further amputation of his right limb. 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  He also has a BKA on the left.  Has complicated PMH including ESRD, ischemica cardiomyopathy, HTN, DM, PVD s/p AICD placement.     PT Comments    Patient required max A +2 for bed mobility and lateral scoot EOB to drop arm recliner. Attempted to use slide board again today however it is too unsafe due to pt's posterior bias in sitting. Wife present during session. Continue to recommend CIR for further skilled PT services to maximize independence and safety with mobility prior to d/c home with wife's assistance.   Follow Up Recommendations  CIR;SNF(Patient has been to CIR in the past )     Equipment Recommendations  Rolling walker with 5" wheels    Recommendations for Other Services Rehab consult     Precautions / Restrictions Precautions Precautions: Fall Restrictions Weight Bearing Restrictions: Yes RLE Weight Bearing: Non weight bearing    Mobility  Bed Mobility Overal bed mobility: Needs Assistance Bed Mobility: Supine to Sit     Supine to sit: Max assist;+2 for physical assistance;HOB elevated     General bed mobility comments: cues for sequencing; assist to bring hips to EOB and elevate trunk into sitting  Transfers Overall transfer level: Needs assistance Equipment used: 2 person hand held assist Transfers: Lateral/Scoot Transfers          Lateral/Scoot Transfers: Max assist;+2 physical assistance General transfer comment: attempted use of slide board however too unsafe as pt does not maintain anterior translation of trunk; +2 with use of gait belt and bed pad required to scoot to recliner from EOB  Ambulation/Gait                 Stairs            Wheelchair Mobility     Modified Rankin (Stroke Patients Only)       Balance Overall balance assessment: Needs assistance Sitting-balance support: Bilateral upper extremity supported Sitting balance-Leahy Scale: Poor Sitting balance - Comments: max A required initially in sitting as pt has posterior bias with progression to min A  Postural control: Posterior lean                                  Cognition Arousal/Alertness: Awake/alert Behavior During Therapy: WFL for tasks assessed/performed Overall Cognitive Status: Within Functional Limits for tasks assessed                                        Exercises      General Comments General comments (skin integrity, edema, etc.): wife present during session      Pertinent Vitals/Pain Pain Assessment: Faces Faces Pain Scale: Hurts even more Pain Location: right residual limb  Pain Descriptors / Indicators: Aching;Constant;Grimacing;Guarding Pain Intervention(s): Limited activity within patient's tolerance;Monitored during session;Premedicated before session;Repositioned;Patient requesting pain meds-RN notified    Home Living Family/patient expects to be discharged to:: Private residence Living Arrangements: Spouse/significant other                  Prior Function  PT Goals (current goals can now be found in the care plan section) Acute Rehab PT Goals PT Goal Formulation: With patient/family Time For Goal Achievement: 05/13/17 Potential to Achieve Goals: Good Progress towards PT goals: Progressing toward goals    Frequency    Min 4X/week      PT Plan Current plan remains appropriate    Co-evaluation              AM-PAC PT "6 Clicks" Daily Activity  Outcome Measure  Difficulty turning over in bed (including adjusting bedclothes, sheets and blankets)?: A Lot Difficulty moving from lying on back to sitting on the side of the bed? : Unable Difficulty sitting down on and  standing up from a chair with arms (e.g., wheelchair, bedside commode, etc,.)?: Unable Help needed moving to and from a bed to chair (including a wheelchair)?: Total Help needed walking in hospital room?: Total Help needed climbing 3-5 steps with a railing? : Total 6 Click Score: 7    End of Session Equipment Utilized During Treatment: Gait belt Activity Tolerance: Patient limited by pain;Patient limited by fatigue Patient left: in chair;with call bell/phone within reach;with family/visitor present Nurse Communication: Mobility status;Need for lift equipment PT Visit Diagnosis: Muscle weakness (generalized) (M62.81);Difficulty in walking, not elsewhere classified (R26.2);Pain Pain - Right/Left: Right Pain - part of body: Leg     Time: 9604-5409 PT Time Calculation (min) (ACUTE ONLY): 33 min  Charges:  $Therapeutic Activity: 23-37 mins                    G Codes:       Erline Levine, PTA Pager: [phone]     Carolynne Edouard 04/30/2017, 11:50 AM

## 2017-04-30 NOTE — Progress Notes (Signed)
  [facility] Progress Note   Assessment/ Plan:    [facility] [place] [facility] 6 exchanges/day (5 on cycler) + one mid-day exchange (occurs around 9am).  2.5 liters of 2.5% per [facility] 2 liters of 2.5% for last fill and mid day drain. 1 hr 20 min dwell time Dr. Denny PeonBaveja.    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 04/28/17.  Plan is to go home Monday with woundvac.  Pain is an issue- have d/w pt and wife to make a plan with nursing today to get prn pain meds in system at regular intervals if possible  2 ESRD: Continue PD--> increasing exchanges in cycler and leaving off mid-day exchange.  All 1.5% for now as PO intake is poor.  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  Expect to fall postop.  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  Nutrition: albumin is 2.0.  Prostat.  Not eating well today.  7.  Dispo: plan for home on Monday    Subjective:    Having some expected postoperative pain.     Objective:   BP (!) 134/54 (BP Location: Right Arm)   Pulse 75   Temp 98.6 F (37 C) (Axillary)   Resp 16   Ht 5\' 9"  (1.753 m)   Wt 71.2 kg (156 lb 15.5 oz)   SpO2 100%   BMI 23.18 kg/m   Physical Exam: GEN older gentleman, appears uncomfortable HEENT EOMI, slightly disconjugate gaze NECK no JVD PULM clear bilaterally CV RRR no m/r/g ABD soft nontender, PD cath c/d/i EXT s/p L BKA and R AKA in woundvac, minimal serosang discharge NEURO AAO x 3    Labs: BMET Recent Labs  Lab 04/28/17 0800  NA 135  K 3.9  CL 99*  CO2 22  GLUCOSE 316*  BUN 37*  CREATININE 6.48*  CALCIUM 8.9   CBC Recent Labs  Lab 04/28/17 0800  WBC 8.1  HGB 11.1*  HCT 35.2*  MCV 92.1  PLT 276    @IMGRELPRIORS @ Medications:    . amiodarone  200 mg Oral BID  . aspirin EC  81 mg Oral Daily  . atorvastatin  80 mg Oral q1800  . calcium acetate  667 mg Oral  TID WC  . carvedilol  6.25 mg Oral BID WC  . clopidogrel  75 mg Oral Daily  . Darbepoetin Alfa  200 mcg Subcutaneous Q Sat-1800  . docusate sodium  100 mg Oral BID  . feeding supplement (NEPRO CARB STEADY)  237 mL Oral QHS  . feeding supplement (PRO-STAT SUGAR FREE 64)  30 mL Oral BID  . gentamicin cream  1 application Topical Daily  . insulin aspart  0-9 Units Subcutaneous TID WC  . insulin aspart  3 Units Subcutaneous TID WC  . insulin glargine  20 Units Subcutaneous QHS  . multivitamin  1 tablet Oral QHS     Bufford ButtnerElizabeth Ranger Petrich, MD [facility] pgr [phone] 04/30/2017, 10:29 AM

## 2017-05-01 LAB — CBC WITH DIFFERENTIAL/PLATELET
Basophils Absolute: 0 10*3/uL (ref 0.0–0.1)
Basophils Relative: 0 %
EOS PCT: 2 %
Eosinophils Absolute: 0.2 10*3/uL (ref 0.0–0.7)
HCT: 30.8 % — ABNORMAL LOW (ref 39.0–52.0)
Hemoglobin: 9.8 g/dL — ABNORMAL LOW (ref 13.0–17.0)
LYMPHS ABS: 1.2 10*3/uL (ref 0.7–4.0)
LYMPHS PCT: 12 %
MCH: 29 pg (ref 26.0–34.0)
MCHC: 31.8 g/dL (ref 30.0–36.0)
MCV: 91.1 fL (ref 78.0–100.0)
MONO ABS: 1.1 10*3/uL — AB (ref 0.1–1.0)
MONOS PCT: 11 %
Neutro Abs: 7.5 10*3/uL (ref 1.7–7.7)
Neutrophils Relative %: 75 %
PLATELETS: 233 10*3/uL (ref 150–400)
RBC: 3.38 MIL/uL — ABNORMAL LOW (ref 4.22–5.81)
RDW: 15.1 % (ref 11.5–15.5)
WBC: 10 10*3/uL (ref 4.0–10.5)

## 2017-05-01 LAB — RENAL FUNCTION PANEL
Albumin: 1.6 g/dL — ABNORMAL LOW (ref 3.5–5.0)
Anion gap: 12 (ref 5–15)
BUN: 47 mg/dL — AB (ref 6–20)
CHLORIDE: 93 mmol/L — AB (ref 101–111)
CO2: 25 mmol/L (ref 22–32)
Calcium: 8.3 mg/dL — ABNORMAL LOW (ref 8.9–10.3)
Creatinine, Ser: 6.22 mg/dL — ABNORMAL HIGH (ref 0.61–1.24)
GFR calc Af Amer: 9 mL/min — ABNORMAL LOW (ref >60)
GFR calc non Af Amer: 8 mL/min — ABNORMAL LOW (ref >60)
GLUCOSE: 241 mg/dL — AB (ref 65–99)
Phosphorus: 3.6 mg/dL (ref 2.5–4.6)
Potassium: 2.8 mmol/L — ABNORMAL LOW (ref 3.5–5.1)
Sodium: 130 mmol/L — ABNORMAL LOW (ref 135–145)

## 2017-05-01 LAB — GLUCOSE, CAPILLARY
GLUCOSE-CAPILLARY: 178 mg/dL — AB (ref 65–99)
GLUCOSE-CAPILLARY: 144 mg/dL — AB (ref 65–99)
Glucose-Capillary: 252 mg/dL — ABNORMAL HIGH (ref 65–99)
Glucose-Capillary: 38 mg/dL — CL (ref 65–99)
Glucose-Capillary: 77 mg/dL (ref 65–99)

## 2017-05-01 MED ORDER — DELFLEX-LC/2.5% DEXTROSE 394 MOSM/L IP SOLN: INTRAPERITONEAL | Status: DC

## 2017-05-01 MED ORDER — GENTAMICIN SULFATE 0.1 % EX CREA
1.0000 "application " | TOPICAL_CREAM | Freq: Every day | CUTANEOUS | Status: DC
  Administered 2017-05-02 – 2017-05-03 (×3): 1 via TOPICAL
  Filled 2017-05-01: qty 15

## 2017-05-01 MED ORDER — PROMETHAZINE HCL 25 MG/ML IJ SOLN
12.5000 mg | Freq: Four times a day (QID) | INTRAMUSCULAR | Status: DC | PRN
  Administered 2017-05-01: 12.5 mg via INTRAVENOUS
  Filled 2017-05-01: qty 1

## 2017-05-01 MED ORDER — DIPHENHYDRAMINE HCL 25 MG PO CAPS
25.0000 mg | ORAL_CAPSULE | Freq: Four times a day (QID) | ORAL | Status: DC | PRN
  Administered 2017-05-02: 25 mg via ORAL
  Filled 2017-05-01: qty 1

## 2017-05-01 MED ORDER — METOCLOPRAMIDE HCL 5 MG/ML IJ SOLN
5.0000 mg | Freq: Three times a day (TID) | INTRAMUSCULAR | Status: DC
  Administered 2017-05-01: 5 mg via INTRAVENOUS
  Filled 2017-05-01 (×2): qty 2

## 2017-05-01 MED ORDER — HEPARIN 1000 UNIT/ML FOR PERITONEAL DIALYSIS
INTRAMUSCULAR | Status: DC | PRN
  Filled 2017-05-01: qty 3000

## 2017-05-01 MED ORDER — POTASSIUM CHLORIDE 20 MEQ PO PACK
20.0000 meq | PACK | Freq: Two times a day (BID) | ORAL | Status: AC
  Administered 2017-05-01 – 2017-05-02 (×3): 20 meq via ORAL
  Filled 2017-05-01 (×4): qty 1

## 2017-05-01 MED ORDER — HEPARIN 1000 UNIT/ML FOR PERITONEAL DIALYSIS: 500.0000 [IU] | INTRAMUSCULAR | Status: DC | PRN

## 2017-05-01 MED ORDER — DELFLEX-LC/1.5% DEXTROSE 344 MOSM/L IP SOLN: INTRAPERITONEAL | Status: DC

## 2017-05-01 MED ORDER — OXYCODONE HCL 5 MG PO TABS: 5.0000 mg | ORAL_TABLET | ORAL | 0 refills | Status: DC | PRN

## 2017-05-01 NOTE — Progress Notes (Signed)
[facility] Progress Note   Assessment/ Plan:    Center: [place] [facility]. 6 exchanges/day (5 on cycler) + one mid-day exchange (occurs around 9am).  2.5 liters of 2.5% per [facility]CPD 2 liters of 2.5% for last fill and mid day drain. 1 hr 20 min dwell time Dr. Denny PeonBaveja.    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 04/28/17.  Plan is to go home with woundvac.  Pt and wife do not think he can go home- thinking rehab- plan is up in air. Pain is not as much of an issue as vomiting daily   2 ESRD: Continue PD--> increasing exchanges in cycler and leaving off mid-day exchange.  All 1.5% for now as PO intake is poor. 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  Nutrition: albumin is 2.0.  Prostat.  Not eating well today. Throwing up every day- had endoscopy was negative- last BM was Thursday- will make reglan scheduled   7.  Dispo: not sure at this time.  If goes to SNF cannot do PD  8. Hypokalemia due to vomiting - replete   Subjective:    Per wife is throwing up every day- there is discharge summary in chart to SNF but cannot do PD at SNF unless it was rehab here   Objective:   BP (!) 116/46 (BP Location: Right Arm)   Pulse 84   Temp 98 F (36.7 C) (Oral)   Resp 16   Ht 5\' 9"  (1.753 m)   Wt 71.4 kg (157 lb 6.5 oz)   SpO2 100%   BMI 23.25 kg/m   Physical Exam: GEN older gentleman, appears uncomfortable HEENT EOMI, slightly disconjugate gaze NECK no JVD PULM clear bilaterally CV RRR no m/r/g ABD soft nontender, PD cath c/d/i EXT s/p L BKA and R AKA in woundvac, minimal serosang discharge NEURO AAO x 3    Labs: BMET Recent Labs  Lab 04/28/17 0800 05/01/17 0611  NA 135 130*  K 3.9 2.8*  CL 99* 93*  CO2 22  25  GLUCOSE 316* 241*  BUN 37* 47*  CREATININE 6.48* 6.22*  CALCIUM 8.9 8.3*  PHOS  --  3.6   CBC Recent Labs  Lab 04/28/17 0800 05/01/17 0611  WBC 8.1 10.0  NEUTROABS  --  7.5  HGB 11.1* 9.8*  HCT 35.2* 30.8*  MCV 92.1 91.1  PLT 276 233    @IMGRELPRIORS @ Medications:    . amiodarone  200 mg Oral BID  . aspirin EC  81 mg Oral Daily  . atorvastatin  80 mg Oral q1800  . calcium acetate  667 mg Oral TID WC  . carvedilol  6.25 mg Oral BID WC  . clopidogrel  75 mg Oral Daily  . Darbepoetin Alfa  200 mcg Subcutaneous Q Sat-1800  . docusate sodium  100 mg Oral BID  . feeding supplement (NEPRO CARB STEADY)  237 mL Oral QHS  . feeding supplement (PRO-STAT SUGAR FREE 64)  30 mL Oral BID  . gentamicin cream  1 application Topical Daily  . insulin aspart  0-9 Units Subcutaneous TID WC  . insulin aspart  3 Units Subcutaneous TID WC  . insulin glargine  20 Units Subcutaneous QHS  .  multivitamin  1 tablet Oral QHS     Kaston Faughn A  05/01/2017, 11:02 AM

## 2017-05-01 NOTE — Progress Notes (Addendum)
   Subjective:  Patient reports pain as moderate. Complaining of itching to AKA stump.   Objective:   VITALS:   Vitals:   04/30/17 1200 04/30/17 1300 04/30/17 2055 05/01/17 0511  BP:  (!) 120/42 129/85 (!) 138/52  Pulse:  71 77 81  Resp:  16 17 17   Temp:  98.4 F (36.9 C) 98.9 F (37.2 C) 97.9 F (36.6 C)  TempSrc:  Oral Oral Oral  SpO2:  98% 98% 97%  Weight: 154 lb 12.2 oz (70.2 kg)     Height:        Vac with good seal to fit and is actively draining from right AKA stump. No rash or erythema. No skin irritiation to stump.   Last had prn pain meds on 04/29/17  Lab Results  Component Value Date   WBC 10.0 05/01/2017   HGB 9.8 (L) 05/01/2017   HCT 30.8 (L) 05/01/2017   MCV 91.1 05/01/2017   PLT 233 05/01/2017     Assessment/Plan:  3 Days Post-Op   - Expected postop acute blood loss anemia - will monitor for symptoms - Up with PT/OT - DVT ppx - SCDs, ambulation, aspirin, plavix - NWB operative extremity - Pain control, encouraged to ask for prn meds if needed - Discharge planning - to be discharged to rehab for increased needs with prevena when placement arranged, SW consulted  Addendum: To hold discharge for today. Inpatient rehab to evaluate patient for admission  [name] 05/01/2017, 9:55 AM [phone]

## 2017-05-01 NOTE — Progress Notes (Signed)
Physical Therapy Treatment Patient Details Name: Joshua [name] MRN: [id number] DOB: 06-04-1940 Today's Date: 05/01/2017    History of Present Illness Pt is a 77 y.o. male admitted for further amputation of his right limb. 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  He also has a BKA on the left.  Has complicated PMH including ESRD, ischemica cardiomyopathy, HTN, DM, PVD s/p AICD placement.     PT Comments    Continuing to work on sitting balance and fwd wt shift in sitting to allow for transfers. Worked on A/P transfers today into chair. Has increased RLE pain with hip flexion which is leading to increased posterior lean. Pt lethargic during session but aroused with commands.  PT will continue to follow.   Follow Up Recommendations  CIR(Patient has been to CIR in the past )     Equipment Recommendations  None recommended by PT    Recommendations for Other Services Rehab consult     Precautions / Restrictions Precautions Precautions: Fall Restrictions Weight Bearing Restrictions: Yes RLE Weight Bearing: Non weight bearing    Mobility  Bed Mobility Overal bed mobility: Needs Assistance Bed Mobility: Supine to Sit     Supine to sit: Max assist;+2 for physical assistance Sit to supine: Mod assist   General bed mobility comments: cues for sequencing; assist to bring hips to EOB and elevate trunk into sitting  Transfers Overall transfer level: Needs assistance Equipment used: None Transfers: Licensed conveyancerAnterior-Posterior Transfer       Anterior-Posterior transfers: Max assist;+2 physical assistance   General transfer comment: pt still with heavy posterior lean in sitting. Practiced fwd wt shifting while scooting bkwd into chair. Pt attempted to use UE's to help push self bkwd but was able to give <10%  Ambulation/Gait             General Gait Details: N/A   Stairs            Wheelchair Mobility    Modified Rankin (Stroke Patients Only)        Balance Overall balance assessment: Needs assistance Sitting-balance support: Bilateral upper extremity supported Sitting balance-Leahy Scale: Poor Sitting balance - Comments: max A required initially in sitting as pt has posterior bias with progression to min A  Postural control: Posterior lean                                  Cognition Arousal/Alertness: Lethargic;Suspect due to medications Behavior During Therapy: [facility] for tasks assessed/performed;Flat affect Overall Cognitive Status: Impaired/Different from baseline Area of Impairment: Problem solving                             Problem Solving: Slow processing;Decreased initiation;Difficulty sequencing;Requires verbal cues;Requires tactile cues General Comments: pt very flat throughout session, depressed mood and minimal verbalization. Some of this likely due to lethargy with meds but even when alert, mood remained low and pt needed max encouragement to participate      Exercises Amputee Exercises Quad Sets: Left;10 reps;Supine Gluteal Sets: AROM;Both;10 reps;Supine Hip Extension: AROM;Right;10 reps;Supine Hip ABduction/ADduction: AROM;Right;10 reps;Supine Knee Extension: AROM;Left;10 reps;Supine Straight Leg Raises: AROM;Both;10 reps;Supine Chair Push Up: (encouraged but pt did not attempt)    General Comments        Pertinent Vitals/Pain Pain Assessment: Faces Faces Pain Scale: Hurts even more Pain Location: right residual limb  Pain Descriptors / Indicators:  Aching;Constant;Grimacing;Guarding Pain Intervention(s): Limited activity within patient's tolerance;Monitored during session;Premedicated before session    Home Living                      Prior Function            PT Goals (current goals can now be found in the care plan section) Acute Rehab PT Goals Patient Stated Goal: to be able to transfer to his wheelchair  PT Goal Formulation: With patient/family Time For Goal  Achievement: 05/13/17 Potential to Achieve Goals: Good Progress towards PT goals: Progressing toward goals    Frequency    Min 4X/week      PT Plan Current plan remains appropriate    Co-evaluation              AM-PAC PT "6 Clicks" Daily Activity  Outcome Measure  Difficulty turning over in bed (including adjusting bedclothes, sheets and blankets)?: Unable Difficulty moving from lying on back to sitting on the side of the bed? : Unable Difficulty sitting down on and standing up from a chair with arms (e.g., wheelchair, bedside commode, etc,.)?: Unable Help needed moving to and from a bed to chair (including a wheelchair)?: Total Help needed walking in hospital room?: Total Help needed climbing 3-5 steps with a railing? : Total 6 Click Score: 6    End of Session   Activity Tolerance: Patient limited by pain;Patient limited by lethargy Patient left: in chair;with call bell/phone within reach;with family/visitor present Nurse Communication: Mobility status;Need for lift equipment PT Visit Diagnosis: Muscle weakness (generalized) (M62.81);Difficulty in walking, not elsewhere classified (R26.2);Pain Pain - Right/Left: Right Pain - part of body: Leg     Time: 1610-9604 PT Time Calculation (min) (ACUTE ONLY): 30 min  Charges:  $Therapeutic Exercise: 8-22 mins $Therapeutic Activity: 8-22 mins                    G Codes:       Lyanne Co, PT  Acute Rehab Services  [phone]    [name] 05/01/2017, 11:35 AM

## 2017-05-01 NOTE — Progress Notes (Signed)
Inpatient Rehabilitation  Per PT request, patient was screened by Fae PippinMelissa Anjalee Cope for appropriateness for an Inpatient Acute Rehab consult.  At this time we are recommending an Inpatient Rehab consult.  Called NP to notify; please order if you are agreeable.    Charlane FerrettiMelissa Saman Giddens, M.A., CCC/SLP Admission Coordinator  [facility]  Cell [phone]

## 2017-05-01 NOTE — Progress Notes (Signed)
Dr Lajoyce Cornersuda was notified of patient having bouts with nausea and vomiting, especially at meal time.  New order received for nausea and vomiting.

## 2017-05-01 NOTE — Discharge Summary (Signed)
Discharge Diagnoses:  Active Problems:   Dehiscence of amputation stump (HCC)   Atherosclerosis of native arteries of extremities with gangrene, right leg (HCC)   Above knee amputation of right lower extremity (HCC)   Surgeries: Procedure(s): RIGHT ABOVE KNEE AMPUTATION on 04/28/2017    Consultants: Treatment Team:  Bufford ButtnerUpton, Elizabeth, MD  Discharged Condition: Improved  Hospital Course: [name] is an 77 y.o. male who was admitted 04/28/2017 with a chief complaint of No chief complaint on file. , and found to have a diagnosis of Gangrene Right Below Knee Amputation.  They were brought to the operating room on 04/28/2017 and underwent Procedure(s): RIGHT ABOVE KNEE AMPUTATION.    They were given perioperative antibiotics:  Anti-infectives (From admission, onward)   Start     Dose/Rate Route Frequency Ordered Stop   04/28/17 2200  ceFAZolin (ANCEF) IVPB 1 g/50 mL premix     1 g 100 mL/hr over 30 Minutes Intravenous Every 12 hours 04/28/17 1242 04/29/17 0036   04/28/17 0848  ceFAZolin (ANCEF) 2-4 GM/100ML-% IVPB    Comments:  Posey ProntoFuller, Emily   : cabinet override      04/28/17 0848 04/28/17 1014   04/28/17 0845  ceFAZolin (ANCEF) IVPB 2g/100 mL premix     2 g 200 mL/hr over 30 Minutes Intravenous On call to O.R. 04/28/17 0845 04/28/17 1014    .  They were given sequential compression devices, early ambulation, and Other (comment) for DVT prophylaxis. ASA and plavix  Recent vital signs:  Patient Vitals for the past 24 hrs:  BP Temp Temp src Pulse Resp SpO2 Weight  05/01/17 0511 (!) 138/52 97.9 F (36.6 C) Oral 81 17 97 % -  04/30/17 2055 129/85 98.9 F (37.2 C) Oral 77 17 98 % -  04/30/17 1300 (!) 120/42 98.4 F (36.9 C) Oral 71 16 98 % -  04/30/17 1200 - - - - - - 154 lb 12.2 oz (70.2 kg)  .  Recent laboratory studies: No results found.  Discharge Medications:   Allergies as of 05/01/2017      Reactions   Sulfa Antibiotics Hives   Vancomycin Other (See Comments)   Blisters   Zanaflex [tizanidine Hcl] Other (See Comments)   Hypotensive stroke   Gabapentin (once-daily) Hypertension   Lisinopril Hives   Methotrexate Derivatives Other (See Comments)   blisters   Tape Hives   Flexeril [cyclobenzaprine] Other (See Comments)   somnolence      Medication List    STOP taking these medications   clindamycin 150 MG capsule Commonly known as:  CLEOCIN     TAKE these medications   acetaminophen 500 MG tablet Commonly known as:  TYLENOL Take 1,000 mg by mouth 2 (two) times daily as needed for moderate pain or headache.   amiodarone 200 MG tablet Commonly known as:  PACERONE Take 1 tablet (200 mg total) by mouth 2 (two) times daily.   aspirin EC 81 MG tablet Take 81 mg by mouth daily.   atorvastatin 80 MG tablet Commonly known as:  LIPITOR Take 1 tablet (80 mg total) by mouth daily at 6 PM.   calcium acetate 667 MG capsule Commonly known as:  PHOSLO Take 1 capsule (667 mg total) by mouth 3 (three) times daily with meals.   carvedilol 6.25 MG tablet Commonly known as:  COREG Take 1 tablet (6.25 mg total) by mouth 2 (two) times daily with a meal. Please keep upcoming appt in January for future refills. Thank you  clopidogrel 75 MG tablet Commonly known as:  PLAVIX Take 1 tablet (75 mg total) by mouth daily.   Darbepoetin Alfa 200 MCG/0.4ML Sosy injection Commonly known as:  ARANESP Inject 0.4 mLs (200 mcg total) into the skin every Saturday at 6 PM.   docusate sodium 100 MG capsule Commonly known as:  COLACE Take 100 mg by mouth 2 (two) times daily.   feeding supplement (NEPRO CARB STEADY) Liqd Take 237 mLs by mouth at bedtime.   feeding supplement (PRO-STAT SUGAR FREE 64) Liqd Take 30 mLs by mouth 2 (two) times daily.   insulin aspart 100 UNIT/ML injection Commonly known as:  novoLOG Inject 0-8 Units into the skin 3 (three) times daily with meals.   insulin NPH Human 100 UNIT/ML injection Commonly known as:  HUMULIN N,NOVOLIN  N Inject 0.3 mLs (30 Units total) into the skin 2 (two) times daily before a meal.   multivitamin Tabs tablet Take 1 tablet by mouth at bedtime.   nitroGLYCERIN 0.4 MG SL tablet Commonly known as:  NITROSTAT Place 0.4 mg under the tongue every 5 (five) minutes as needed for chest pain.   oxyCODONE 5 MG immediate release tablet Commonly known as:  Oxy IR/ROXICODONE Take 1 tablet (5 mg total) by mouth every 4 (four) hours as needed for severe pain. What changed:  when to take this   polyethylene glycol packet Commonly known as:  MIRALAX / GLYCOLAX Take 17 g by mouth daily. What changed:    when to take this  reasons to take this   silver sulfADIAZINE 1 % cream Commonly known as:  SILVADENE Apply 1 application topically daily.   Vitamin D (Ergocalciferol) 50000 units Caps capsule Commonly known as:  DRISDOL Take 50,000 Units by mouth every 30 (thirty) days.       Diagnostic Studies: No results found.  They benefited maximally from their hospital stay and there were no complications.     Disposition:  Skilled nursing for rehab  Change wound vac to Prevena prior to discharge from hospital   Follow-up Information    Nadara Mustard, MD In 1 week.   Specialty:  Orthopedic Surgery Contact information: [address] [place] [address] [phone]            Author: Barnie Del, FNP 05/01/2017 9:58 AM

## 2017-05-01 NOTE — Progress Notes (Signed)
Inpatient Diabetes Program Recommendations  AACE/ADA: New Consensus Statement on Inpatient Glycemic Control (2015)  Target Ranges:  Prepandial:   less than 140 mg/dL      Peak postprandial:   less than 180 mg/dL (1-2 hours)      Critically ill patients:  140 - 180 mg/dL   Results for [name] KitchenCOMPTON, Baudelio D (MRN [id number]) as of 05/01/2017 08:55  Ref. Range 04/30/2017 06:23 04/30/2017 11:59 04/30/2017 16:47 04/30/2017 21:02 05/01/2017 06:31  Glucose-Capillary Latest Ref Range: 65 - 99 mg/dL 409241 (H) 811193 (H) 914142 (H) 151 (H) 252 (H)   Review of Glycemic Control  Current orders for Inpatient glycemic control: Lantus 20 units QHS, Novolog 0-9 units TID with meals, Novolog 3 units TID with meals for meal coverage  Inpatient Diabetes Program Recommendations: Correction (SSI): Please consider ordering Novolog 0-5 units QHS for bedtime correction. Insulin - Meal Coverage: Please consider increasing meal coverage to Novolog 5 units TID with meals if patient eats at least 50% of meals.  Thanks, Orlando PennerMarie Thoren Hosang, RN, MSN, CDE Diabetes Coordinator Inpatient Diabetes Program [phone] (Team Pager from 8am to 5pm)

## 2017-05-02 DIAGNOSIS — N186 End stage renal disease: Secondary | ICD-10-CM

## 2017-05-02 DIAGNOSIS — D62 Acute posthemorrhagic anemia: Secondary | ICD-10-CM

## 2017-05-02 DIAGNOSIS — I25810 Atherosclerosis of coronary artery bypass graft(s) without angina pectoris: Secondary | ICD-10-CM

## 2017-05-02 DIAGNOSIS — Z89611 Acquired absence of right leg above knee: Secondary | ICD-10-CM

## 2017-05-02 DIAGNOSIS — J449 Chronic obstructive pulmonary disease, unspecified: Secondary | ICD-10-CM

## 2017-05-02 DIAGNOSIS — E109 Type 1 diabetes mellitus without complications: Secondary | ICD-10-CM

## 2017-05-02 DIAGNOSIS — Z992 Dependence on renal dialysis: Secondary | ICD-10-CM

## 2017-05-02 DIAGNOSIS — G8918 Other acute postprocedural pain: Secondary | ICD-10-CM | POA: Insufficient documentation

## 2017-05-02 DIAGNOSIS — E119 Type 2 diabetes mellitus without complications: Secondary | ICD-10-CM | POA: Insufficient documentation

## 2017-05-02 DIAGNOSIS — E162 Hypoglycemia, unspecified: Secondary | ICD-10-CM

## 2017-05-02 LAB — GLUCOSE, CAPILLARY
GLUCOSE-CAPILLARY: 189 mg/dL — AB (ref 65–99)
Glucose-Capillary: 302 mg/dL — ABNORMAL HIGH (ref 65–99)
Glucose-Capillary: 201 mg/dL — ABNORMAL HIGH (ref 65–99)

## 2017-05-02 LAB — HEPATITIS B SURFACE ANTIGEN: HEP B S AG: NEGATIVE

## 2017-05-02 MED ORDER — OXYCODONE HCL 5 MG PO TABS
5.0000 mg | ORAL_TABLET | ORAL | Status: DC | PRN
  Administered 2017-05-02: 5 mg via ORAL
  Filled 2017-05-02: qty 1

## 2017-05-02 MED ORDER — METOCLOPRAMIDE HCL 5 MG/ML IJ SOLN
5.0000 mg | Freq: Two times a day (BID) | INTRAMUSCULAR | Status: DC
Start: 2017-05-02 — End: 2017-05-03
  Administered 2017-05-02 – 2017-05-03 (×2): 5 mg via INTRAVENOUS
  Filled 2017-05-02 (×3): qty 2

## 2017-05-02 NOTE — Progress Notes (Signed)
[facility] Progress Note   Assessment/ Plan:    [facility]: [place] [facility]. 6 exchanges/day (5 on cycler) + one mid-day exchange (occurs around 9am).  2.5 liters of 2.5% per [facility] 2 liters of 2.5% for last fill and mid day drain. 1 hr 20 min dwell time Dr. Denny PeonBaveja.    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 04/28/17.  Plan is to go home with woundvac.  Pt and wife do not think he can go home- thinking rehab- plan is up in air. Pain is not as much of an issue as vomiting daily   2 ESRD: Continue PD--> increasing exchanges in cycler and leaving off mid-day exchange.  All 1.5% for now as PO intake is poor. They reported swelling 1/21- mixed in a 2.5% bag- almost 2 liters of UF- needs again as is edematous  3 Hypertension: well controlled, on low dose Coreg   4. 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  Nutrition: albumin is 2.0---1.6.  Prostat.  Not eating well today. Throwing up every day- had endoscopy was negative- last BM was Thursday- made reglan scheduled - had diarrhea- change to BID   7.  Dispo: not sure at this time.  If goes to SNF cannot do PD[facility]   8. Hypokalemia due to vomiting - replete- check labs in AM  9. Confusion- I agree could be due to pain meds- will max out dose at 5 mg   Subjective:    Now pt confused- no longer throwing up - with reglan had diarrhea but it seems to have worked.  Confusion maybe due to narcotics.  PD went well , had almost 2 liters UF   Objective:   BP (!) 137/51 (BP Location: Right Arm)   Pulse 99   Temp 98.3 F (36.8 C) (Oral)   Resp 16   Ht 5\' 9"  (1.753 m)   Wt 71.4 kg (157 lb 6.5 oz)   SpO2 97%   BMI 23.25 kg/m   Physical Exam: GEN older gentleman, appears uncomfortable HEENT EOMI, slightly disconjugate gaze NECK no JVD PULM clear bilaterally CV RRR no  m/r/g ABD soft nontender, PD cath c/d/i EXT s/p L BKA and R AKA in woundvac, minimal serosang discharge NEURO AAO x 3    Labs: BMET Recent Labs  Lab 04/28/17 0800 05/01/17 0611  NA 135 130*  K 3.9 2.8*  CL 99* 93*  CO2 22 25  GLUCOSE 316* 241*  BUN 37* 47*  CREATININE 6.48* 6.22*  CALCIUM 8.9 8.3*  PHOS  --  3.6   CBC Recent Labs  Lab 04/28/17 0800 05/01/17 0611  WBC 8.1 10.0  NEUTROABS  --  7.5  HGB 11.1* 9.8*  HCT 35.2* 30.8*  MCV 92.1 91.1  PLT 276 233    @IMGRELPRIORS @ Medications:    . amiodarone  200 mg Oral BID  . aspirin EC  81 mg Oral Daily  . atorvastatin  80 mg Oral q1800  . calcium acetate  667 mg Oral TID WC  . carvedilol  6.25 mg Oral BID WC  . clopidogrel  75 mg Oral Daily  . Darbepoetin Alfa  200 mcg Subcutaneous Q Sat-1800  . docusate sodium  100 mg Oral BID  . feeding supplement (NEPRO CARB STEADY)  237 mL Oral QHS  . feeding  supplement (PRO-STAT SUGAR FREE 64)  30 mL Oral BID  . gentamicin cream  1 application Topical Daily  . insulin aspart  0-9 Units Subcutaneous TID WC  . insulin aspart  3 Units Subcutaneous TID WC  . insulin glargine  20 Units Subcutaneous QHS  . metoCLOPramide (REGLAN) injection  5 mg Intravenous Q8H  . multivitamin  1 tablet Oral QHS  . potassium chloride  20 mEq Oral BID     Biana Haggar A  05/02/2017, 10:57 AM

## 2017-05-02 NOTE — Progress Notes (Addendum)
   Subjective:  Patient drowsy, reports pain as moderate.   Objective:   VITALS:   Vitals:   05/01/17 1538 05/01/17 2005 05/01/17 2322 05/02/17 0351  BP: (!) 109/40 120/60 124/63 (!) 137/51  Pulse: 77 80 94 99  Resp:  16 16 16   Temp: 97.9 F (36.6 C) 97.9 F (36.6 C) 98.3 F (36.8 C) 98.3 F (36.8 C)  TempSrc: Oral Oral Oral Oral  SpO2: 98% 96% 100% 97%  Weight:      Height:        Vac with good seal to fit and is actively draining from right AKA stump.   Lab Results  Component Value Date   WBC 10.0 05/01/2017   HGB 9.8 (L) 05/01/2017   HCT 30.8 (L) 05/01/2017   MCV 91.1 05/01/2017   PLT 233 05/01/2017     Assessment/Plan:  4 Days Post-Op   - Expected postop acute blood loss anemia - will monitor for symptoms - Up with PT/OT - DVT ppx - SCDs, ambulation, aspirin, plavix - NWB operative extremity - Pain control, encouraged to ask for prn meds if needed - Discharge planning - to be evaluated for inpatient rehab today for discharge   Addendum - Patient declined CIR, has been in past. Prefers SNF. Patient hopes to use wheelchair for mobility in future - Unable to go to SNF with peritoneal dialysis, case management to set up for Home health, will Discharge home  [name] 05/02/2017, 8:01 AM [phone]

## 2017-05-02 NOTE — Progress Notes (Signed)
Inpatient Diabetes Program Recommendations  AACE/ADA: New Consensus Statement on Inpatient Glycemic Control (2015)  Target Ranges:  Prepandial:   less than 140 mg/dL      Peak postprandial:   less than 180 mg/dL (1-2 hours)      Critically ill patients:  140 - 180 mg/dL   Results for [name] KitchenCOMPTON, Joshua D (MRN [id number]) as of 05/02/2017 12:41  Ref. Range 05/01/2017 06:31 05/01/2017 11:22 05/01/2017 16:34 05/01/2017 17:37 05/01/2017 21:09 05/02/2017 07:25 05/02/2017 11:31  Glucose-Capillary Latest Ref Range: 65 - 99 mg/dL 409252 (H) 811178 (H) 38 (LL) 77 144 (H) 302 (H) 189 (H)   Review of Glycemic Control   Current orders for Inpatient glycemic control: Lantus 20 units QHS, Novolog 0-9 units TID with meals, Novolog 3 units TID with meals for meal coverage  Inpatient Diabetes Program Recommendations Insulin - Basal: Please consider increasing Lantus to 23 units QHS. Insulin - Meal Coverage: Noted glucose down to 38 mg/dl on 9/14/781/21/19. Per notes in chart, patient continue to have nausea and vomiting. NURSING: may want to be sure patient is able to eat and tolerate meal before giving Novolog meal coverage.  Thanks, Orlando PennerMarie Jihaad Bruschi, RN, MSN, CDE Diabetes Coordinator Inpatient Diabetes Program [phone] (Team Pager from 8am to 5pm)

## 2017-05-02 NOTE — Progress Notes (Signed)
Barnie DelErin Zamora NP was notified of the patient's wife concerns regarding taking care of him in the home.  The wife is crying.  NP will call the wife and attempt to make arrangements and or discuss any alternatives for  discharge planning

## 2017-05-02 NOTE — Progress Notes (Signed)
Inpatient Rehabilitation  Please see consult by Dr. Allena KatzPatel for full details; this patient is known to our service and had difficulty tolerating CIR in the past.  Therefore, recommend SNF or Home with 24/7 and home health therapies.  Will sign off.  Call if questions.   Charlane FerrettiMelissa Desiderio Dolata, M.A., CCC/SLP Admission Coordinator  [facility]  Cell [phone]

## 2017-05-02 NOTE — Discharge Summary (Signed)
Discharge Diagnoses:  Active Problems:   Dehiscence of amputation stump (HCC)   Atherosclerosis of native arteries of extremities with gangrene, right leg (HCC)   Above knee amputation of right lower extremity (HCC)   Diabetes mellitus type 2 in nonobese [facility])   Post-operative pain   Surgeries: Procedure(s): RIGHT ABOVE KNEE AMPUTATION on 04/28/2017    Consultants: Treatment Team:  Bufford Buttner, MD  Discharged Condition: Improved  Hospital Course: [name] is an 77 y.o. male who was admitted 04/28/2017 with a chief complaint of No chief complaint on file. , and found to have a diagnosis of Gangrene Right Below Knee Amputation.  They were brought to the operating room on 04/28/2017 and underwent Procedure(s): RIGHT ABOVE KNEE AMPUTATION.    They were given perioperative antibiotics:  Anti-infectives (From admission, onward)   Start     Dose/Rate Route Frequency Ordered Stop   04/28/17 2200  ceFAZolin (ANCEF) IVPB 1 g/50 mL premix     1 g 100 mL/hr over 30 Minutes Intravenous Every 12 hours 04/28/17 1242 04/29/17 0036   04/28/17 0848  ceFAZolin (ANCEF) 2-4 GM/100ML-% IVPB    Comments:  Posey Pronto   : cabinet override      04/28/17 0848 04/28/17 1014   04/28/17 0845  ceFAZolin (ANCEF) IVPB 2g/100 mL premix     2 g 200 mL/hr over 30 Minutes Intravenous On call to O.R. 04/28/17 0845 04/28/17 1014    .  They were given sequential compression devices, early ambulation, and Other (comment) for DVT prophylaxis. ASA and plavix  Recent vital signs:  Patient Vitals for the past 24 hrs:  BP Temp Temp src Pulse Resp SpO2  05/02/17 0351 (!) 137/51 98.3 F (36.8 C) Oral 99 16 97 %  05/01/17 2322 124/63 98.3 F (36.8 C) Oral 94 16 100 %  05/01/17 2005 120/60 97.9 F (36.6 C) Oral 80 16 96 %  05/01/17 1538 (!) 109/40 97.9 F (36.6 C) Oral 77 - 98 %  .  Recent laboratory studies: No results found.  Discharge Medications:   Allergies as of 05/02/2017      Reactions   Sulfa Antibiotics Hives   Vancomycin Other (See Comments)   Blisters   Zanaflex [tizanidine Hcl] Other (See Comments)   Hypotensive stroke   Gabapentin (once-daily) Hypertension   Lisinopril Hives   Methotrexate Derivatives Other (See Comments)   blisters   Tape Hives   Flexeril [cyclobenzaprine] Other (See Comments)   somnolence      Medication List    STOP taking these medications   clindamycin 150 MG capsule Commonly known as:  CLEOCIN     TAKE these medications   acetaminophen 500 MG tablet Commonly known as:  TYLENOL Take 1,000 mg by mouth 2 (two) times daily as needed for moderate pain or headache.   amiodarone 200 MG tablet Commonly known as:  PACERONE Take 1 tablet (200 mg total) by mouth 2 (two) times daily.   aspirin EC 81 MG tablet Take 81 mg by mouth daily.   atorvastatin 80 MG tablet Commonly known as:  LIPITOR Take 1 tablet (80 mg total) by mouth daily at 6 PM.   calcium acetate 667 MG capsule Commonly known as:  PHOSLO Take 1 capsule (667 mg total) by mouth 3 (three) times daily with meals.   carvedilol 6.25 MG tablet Commonly known as:  COREG Take 1 tablet (6.25 mg total) by mouth 2 (two) times daily with a meal. Please keep upcoming appt in January  for future refills. Thank you   clopidogrel 75 MG tablet Commonly known as:  PLAVIX Take 1 tablet (75 mg total) by mouth daily.   Darbepoetin Alfa 200 MCG/0.4ML Sosy injection Commonly known as:  ARANESP Inject 0.4 mLs (200 mcg total) into the skin every Saturday at 6 PM.   docusate sodium 100 MG capsule Commonly known as:  COLACE Take 100 mg by mouth 2 (two) times daily.   feeding supplement (NEPRO CARB STEADY) Liqd Take 237 mLs by mouth at bedtime.   feeding supplement (PRO-STAT SUGAR FREE 64) Liqd Take 30 mLs by mouth 2 (two) times daily.   insulin aspart 100 UNIT/ML injection Commonly known as:  novoLOG Inject 0-8 Units into the skin 3 (three) times daily with meals.   insulin NPH  Human 100 UNIT/ML injection Commonly known as:  HUMULIN N,NOVOLIN N Inject 0.3 mLs (30 Units total) into the skin 2 (two) times daily before a meal.   multivitamin Tabs tablet Take 1 tablet by mouth at bedtime.   nitroGLYCERIN 0.4 MG SL tablet Commonly known as:  NITROSTAT Place 0.4 mg under the tongue every 5 (five) minutes as needed for chest pain.   oxyCODONE 5 MG immediate release tablet Commonly known as:  Oxy IR/ROXICODONE Take 1 tablet (5 mg total) by mouth every 4 (four) hours as needed for severe pain. What changed:  when to take this   polyethylene glycol packet Commonly known as:  MIRALAX / GLYCOLAX Take 17 g by mouth daily. What changed:    when to take this  reasons to take this   silver sulfADIAZINE 1 % cream Commonly known as:  SILVADENE Apply 1 application topically daily.   Vitamin D (Ergocalciferol) 50000 units Caps capsule Commonly known as:  DRISDOL Take 50,000 Units by mouth every 30 (thirty) days.       Diagnostic Studies: No results found.  They benefited maximally from their hospital stay and there were no complications.     Disposition:  Discharge Instructions    Ambulatory referral to Physical Medicine Rehab   Complete by:  As directed    1 month follow up for left AKA, right BKA post-discharge     DIscharge to home with home health services  Change wound vac to Prevena prior to discharge from hospital   Follow-up Information    [name], Marcus V, MD In 1 week.   Specialty:  Orthopedic Surgery Contact information: [address] [place] [address] [phone]            Author: Barnie Delrin Zamora, FNP 05/02/2017 1:43 PM

## 2017-05-02 NOTE — Consult Note (Signed)
Physical Medicine and Rehabilitation Consult Reason for Consult: Decreased functional mobility Referring Physician: [name]   HPI: [name] is a 77 y.o. right handed male with history of end-stage renal disease with peritoneal dialysis, COPD, CAD with CABG/AICD, diabetes mellitus, left BKA 12/23/2016 receiving inpatient rehabilitation services as well as right BKA with revision of left BKA amputation in the past again receiving inpatient rehabilitation services. History taken from chart review and wife. Patient lives with spouse one level home with ramped entrance. He was able to perform transfers with sliding board to an electric wheelchair. Wife assists with ADLs. Presented 04/28/2017 with gangrenous changes of right BKA failing conservative care. Underwent right AKA 04/28/2017 per [name]. Hospital course pain management and wound VAC remains in place. Acute blood loss anemia 9.8 monitored. Patient remains on aspirin and Plavix as prior to admission. Renal services follow-up with peritoneal dialysis ongoing. Physical therapy evaluation completed with recommendations of physical medicine rehabilitation consult.   Review of Systems  Constitutional: Positive for malaise/fatigue. Negative for chills and fever.  HENT: Negative for hearing loss.   Eyes: Negative for blurred vision and double vision.  Respiratory: Negative for cough and shortness of breath.   Cardiovascular: Negative for chest pain.  Gastrointestinal: Positive for constipation. Negative for nausea.       GERD  Genitourinary: Negative for flank pain and hematuria.  Musculoskeletal: Positive for joint pain and myalgias.  Neurological: Positive for weakness.  All other systems reviewed and are negative.  Past Medical History:  Diagnosis Date  . AICD (automatic cardioverter/defibrillator) present    [employer] [phone] lead [id number]  . Anemia   . Arthritis   . CHF (congestive heart failure) (HCC)     . Complication of anesthesia   . COPD (chronic obstructive pulmonary disease) (HCC)   . Coronary artery disease    CABG 2011  . Diabetes mellitus without complication (HCC)   . Dyspnea   . Dysrhythmia   . ESRD on peritoneal dialysis [facility](HCC)    [place] in [place]. CyCler- at night  . Foot infection 01/06/2017  . Gangrene (HCC)    left knee  . Gangrene of left foot (HCC) 11/26/2016  . Gangrene of right foot (HCC) 02/23/2017  . GERD (gastroesophageal reflux disease)   . Hemorrhoids   . History of peritonitis    PD cath related peritonitis in April 2017  . History of transmetatarsal amputation of left foot (HCC) 12/27/2016  . Hypertension   . Occult blood in stools 04/2017  . Peripheral vascular disease (HCC)   . PONV (postoperative nausea and vomiting)   . Sjogren's syndrome (HCC)    Per pt's wife, was diagnosed in 2016 during w/u for cause of renal failure prior to starting dialysis.  He had a rash on his chest apparently prompting this w/u.  Never had renal bx.  He was referred to a rheum MD in [place] and per the wife he was treated with MTX and other medications but had side effects to "all of it" and isn't taking anything for this as of Jun 2017.   Joshua Kitchen. Sleep apnea    wears Bipap  . Stroke (HCC) 2016  . Wound dehiscence    Right foot   Past Surgical History:  Procedure Laterality Date  . ABDOMINAL AORTOGRAM W/LOWER EXTREMITY N/A 11/29/2016   Procedure: ABDOMINAL AORTOGRAM W/LOWER EXTREMITY;  Surgeon: Nada LibmanBrabham, Vance W, MD;  [facility]  INV[facility] CV LAB;  Service: Cardiovascular;  Laterality: N/A;  . AMPUTATION Left 11/29/2016   Procedure: LEFT THIRD TOE AMPUTATION;  Surgeon: Nada Libman, MD;  Location: [facility] OR;  Service: Vascular;  Laterality: Left;  . AMPUTATION Left 12/23/2016   Procedure: Left Transmetatarsal Amputation;  Surgeon: Nadara Mustard, MD;  Location: [facility] OR;  Service: Orthopedics;  Laterality: Left;  . AMPUTATION Left  01/11/2017   Procedure: LEFT BELOW KNEE AMPUTATION;  Surgeon: Nadara Mustard, MD;  Location: [facility] OR;  Service: Orthopedics;  Laterality: Left;  . AMPUTATION Right 01/20/2017   Procedure: AMPUTATION RIGHT FIFTH TOE;  Surgeon: Nada Libman, MD;  Location: [facility] OR;  Service: Vascular;  Laterality: Right;  . AMPUTATION Right 03/01/2017   Procedure: RIGHT BELOW KNEE AMPUTATION;  Surgeon: Nadara Mustard, MD;  Location: [facility] OR;  Service: Orthopedics;  Laterality: Right;  . AMPUTATION Right 04/28/2017   Procedure: RIGHT ABOVE KNEE AMPUTATION;  Surgeon: Nadara Mustard, MD;  Location: [facility] OR;  Service: Orthopedics;  Laterality: Right;  . ANGIOPLASTY  12/15/2016   Procedure: BALLOON ANGIOPLASTY;  Surgeon: Maeola Harman, MD;  Location: [facility] OR;  Service: Vascular;;  . cardiac catherization with stent placement  2017  . COLONOSCOPY W/ BIOPSIES AND POLYPECTOMY    . CORONARY ARTERY BYPASS GRAFT  2011  . FALSE ANEURYSM REPAIR Right 12/15/2016   Procedure: REPAIR FALSE ANEURYSM;  Surgeon: Maeola Harman, MD;  Location: [facility] OR;  Service: Vascular;  Laterality: Right;  . IRRIGATION AND DEBRIDEMENT FOOT Left 12/15/2016   Procedure: IRRIGATION AND DEBRIDEMENT FOOT;  Surgeon: Maeola Harman, MD;  Location: [facility] OR;  Service: Vascular;  Laterality: Left;  . LEFT HEART CATH AND CORS/GRAFTS ANGIOGRAPHY N/A 01/09/2017   Procedure: LEFT HEART CATH AND CORS/GRAFTS ANGIOGRAPHY;  Surgeon: Runell Gess, MD;  Location: [facility] INVASIVE CV LAB;  Service: Cardiovascular;  Laterality: N/A;  . LOWER EXTREMITY ANGIOGRAM Left 12/15/2016   Procedure: LOWER EXTREMITY ANGIOGRAM; PEDAL ACCESS;  Surgeon: Maeola Harman, MD;  Location: [facility] OR;  Service: Vascular;  Laterality: Left;  . PERIPHERAL VASCULAR BALLOON ANGIOPLASTY  11/29/2016   Procedure: PERIPHERAL VASCULAR BALLOON ANGIOPLASTY;  Surgeon: Nada Libman, MD;  Location: [facility] INVASIVE CV LAB;  Service: Cardiovascular;;  LT Peroneal AT attempted  unsuccessful  . SIGMOIDECTOMY    . STUMP REVISION Left 03/01/2017   Procedure: REVISION LEFT BELOW KNEE AMPUTATION;  Surgeon: Nadara Mustard, MD;  Location: [facility] OR;  Service: Orthopedics;  Laterality: Left;   Family History  Problem Relation Age of Onset  . Heart disease Father   . Diabetes Mother    Social History:  reports that he has quit smoking. His smoking use included pipe. he has never used smokeless tobacco. He reports that he does not drink alcohol or use drugs. Allergies:  Allergies  Allergen Reactions  . Sulfa Antibiotics Hives  . Vancomycin Other (See Comments)    Blisters   . Zanaflex [Tizanidine Hcl] Other (See Comments)    Hypotensive stroke  . Gabapentin (Once-Daily) Hypertension  . Lisinopril Hives  . Methotrexate Derivatives Other (See Comments)    blisters  . Tape Hives  . Flexeril [Cyclobenzaprine] Other (See Comments)    somnolence   Medications Prior to Admission  Medication Sig Dispense Refill  . acetaminophen (TYLENOL) 500 MG tablet Take 1,000 mg by mouth 2 (two) times daily as needed for moderate pain or headache.    . Amino Acids-Protein Hydrolys (FEEDING SUPPLEMENT, PRO-STAT SUGAR FREE 64,) LIQD Take 30  mLs by mouth 2 (two) times daily. 900 mL 0  . amiodarone (PACERONE) 200 MG tablet Take 1 tablet (200 mg total) by mouth 2 (two) times daily. 60 tablet 1  . aspirin EC 81 MG tablet Take 81 mg by mouth daily.    Joshua Kitchen atorvastatin (LIPITOR) 80 MG tablet Take 1 tablet (80 mg total) by mouth daily at 6 PM. 30 tablet 0  . calcium acetate (PHOSLO) 667 MG capsule Take 1 capsule (667 mg total) by mouth 3 (three) times daily with meals. 90 capsule 0  . carvedilol (COREG) 6.25 MG tablet Take 1 tablet (6.25 mg total) by mouth 2 (two) times daily with a meal. Please keep upcoming appt in January for future refills. Thank you 60 tablet 0  . clopidogrel (PLAVIX) 75 MG tablet Take 1 tablet (75 mg total) by mouth daily. 30 tablet 0  . Darbepoetin Alfa (ARANESP) 200  MCG/0.4ML SOSY injection Inject 0.4 mLs (200 mcg total) into the skin every Saturday at 6 PM. 1.68 mL   . docusate sodium (COLACE) 100 MG capsule Take 100 mg by mouth 2 (two) times daily.     . insulin aspart (NOVOLOG) 100 UNIT/ML injection Inject 0-8 Units into the skin 3 (three) times daily with meals. 10 mL 11  . insulin NPH Human (HUMULIN N,NOVOLIN N) 100 UNIT/ML injection Inject 0.3 mLs (30 Units total) into the skin 2 (two) times daily before a meal.    . multivitamin (RENA-VIT) TABS tablet Take 1 tablet by mouth at bedtime.  0  . nitroGLYCERIN (NITROSTAT) 0.4 MG SL tablet Place 0.4 mg under the tongue every 5 (five) minutes as needed for chest pain.    . Nutritional Supplements (FEEDING SUPPLEMENT, NEPRO CARB STEADY,) LIQD Take 237 mLs by mouth at bedtime. 6000 mL 3  . polyethylene glycol (MIRALAX / GLYCOLAX) packet Take 17 g by mouth daily. (Patient taking differently: Take 17 g by mouth daily as needed for mild constipation. )    . silver sulfADIAZINE (SILVADENE) 1 % cream Apply 1 application topically daily. 400 g 0  . Vitamin D, Ergocalciferol, (DRISDOL) 50000 units CAPS capsule Take 50,000 Units by mouth every 30 (thirty) days.     . clindamycin (CLEOCIN) 150 MG capsule Take 1 capsule (150 mg total) by mouth 3 (three) times daily. (Patient not taking: Reported on 04/27/2017) 42 capsule 0    Home: Home Living Family/patient expects to be discharged to:: Private residence Living Arrangements: Spouse/significant other Available Help at Discharge: Family, Available 24 hours/day Type of Home: Mobile home Home Access: Ramped entrance Home Layout: One level Bathroom Shower/Tub: Psychologist, counselling, Sport and exercise psychologist: Standard Additional Comments: Wife was assisting him at home prior to new surgery   Functional History: Prior Function Level of Independence: Needs assistance Gait / Transfers Assistance Needed: does sliding board transfers to electric  w/c ADL's / Homemaking Assistance  Needed: A for socks, can bath self in shower Functional Status:  Mobility: Bed Mobility Overal bed mobility: Needs Assistance Bed Mobility: Supine to Sit Supine to sit: Max assist, +2 for physical assistance Sit to supine: Mod assist General bed mobility comments: cues for sequencing; assist to bring hips to EOB and elevate trunk into sitting Transfers Overall transfer level: Needs assistance Equipment used: None Transfers: Counselling psychologist transfers: Max assist, +2 physical assistance  Lateral/Scoot Transfers: Max assist, +2 physical assistance General transfer comment: pt still with heavy posterior lean in sitting. Practiced fwd wt shifting while scooting bkwd into chair. Pt attempted to  use UE's to help push self bkwd but was able to give <10% Ambulation/Gait General Gait Details: N/A    ADL:    Cognition: Cognition Overall Cognitive Status: Impaired/Different from baseline Orientation Level: Oriented X4 Cognition Arousal/Alertness: Lethargic, Suspect due to medications Behavior During Therapy: WFL for tasks assessed/performed, Flat affect Overall Cognitive Status: Impaired/Different from baseline Area of Impairment: Problem solving Problem Solving: Slow processing, Decreased initiation, Difficulty sequencing, Requires verbal cues, Requires tactile cues General Comments: pt very flat throughout session, depressed mood and minimal verbalization. Some of this likely due to lethargy with meds but even when alert, mood remained low and pt needed max encouragement to participate  Blood pressure (!) 137/51, pulse 99, temperature 98.3 F (36.8 C), temperature source Oral, resp. rate 16, height 5\' 9"  (1.753 m), weight 71.4 kg (157 lb 6.5 oz), SpO2 97 %. Physical Exam  Vitals reviewed. Constitutional: He is oriented to person, place, and time. He appears well-developed and well-nourished.  HENT:  Head: Normocephalic and atraumatic.  Eyes: EOM are  normal. Right eye exhibits no discharge. Left eye exhibits no discharge.  Neck: Normal range of motion. Neck supple. No thyromegaly present.  Cardiovascular: Normal rate and regular rhythm.  Respiratory: Effort normal and breath sounds normal. No respiratory distress.  GI: Soft. Bowel sounds are normal. He exhibits no distension.  Musculoskeletal:  RLE edema and tenderness  Neurological: He is oriented to person, place, and time.  Somnolent Motor: B/l UE 4+/5 proximal to distal RLE: HF 3-/5 (pain inhibition) LLE: HF, KE 4+/5   Skin:  Right AKA site is dressed with wound VAC in place. Left amputation site clean and dry  Psychiatric: He has a normal mood and affect. His behavior is normal.    Results for orders placed or performed during the hospital encounter of 04/28/17 (from the past 24 hour(s))  Renal function panel     Status: Abnormal   Collection Time: 05/01/17  6:11 AM  Result Value Ref Range   Sodium 130 (L) 135 - 145 mmol/L   Potassium 2.8 (L) 3.5 - 5.1 mmol/L   Chloride 93 (L) 101 - 111 mmol/L   CO2 25 22 - 32 mmol/L   Glucose, Bld 241 (H) 65 - 99 mg/dL   BUN 47 (H) 6 - 20 mg/dL   Creatinine, Ser 1.61 (H) 0.61 - 1.24 mg/dL   Calcium 8.3 (L) 8.9 - 10.3 mg/dL   Phosphorus 3.6 2.5 - 4.6 mg/dL   Albumin 1.6 (L) 3.5 - 5.0 g/dL   GFR calc non Af Amer 8 (L) >60 mL/min   GFR calc Af Amer 9 (L) >60 mL/min   Anion gap 12 5 - 15  CBC with Differential/Platelet     Status: Abnormal   Collection Time: 05/01/17  6:11 AM  Result Value Ref Range   WBC 10.0 4.0 - 10.5 K/uL   RBC 3.38 (L) 4.22 - 5.81 MIL/uL   Hemoglobin 9.8 (L) 13.0 - 17.0 g/dL   HCT 09.6 (L) 04.5 - 40.9 %   MCV 91.1 78.0 - 100.0 fL   MCH 29.0 26.0 - 34.0 pg   MCHC 31.8 30.0 - 36.0 g/dL   RDW 81.1 91.4 - 78.2 %   Platelets 233 150 - 400 K/uL   Neutrophils Relative % 75 %   Neutro Abs 7.5 1.7 - 7.7 K/uL   Lymphocytes Relative 12 %   Lymphs Abs 1.2 0.7 - 4.0 K/uL   Monocytes Relative 11 %   Monocytes Absolute  1.1 (H)  0.1 - 1.0 K/uL   Eosinophils Relative 2 %   Eosinophils Absolute 0.2 0.0 - 0.7 K/uL   Basophils Relative 0 %   Basophils Absolute 0.0 0.0 - 0.1 K/uL  Glucose, capillary     Status: Abnormal   Collection Time: 05/01/17  6:31 AM  Result Value Ref Range   Glucose-Capillary 252 (H) 65 - 99 mg/dL  Hepatitis B surface antigen     Status: None   Collection Time: 05/01/17  9:43 AM  Result Value Ref Range   Hepatitis B Surface Ag Negative Negative  Glucose, capillary     Status: Abnormal   Collection Time: 05/01/17 11:22 AM  Result Value Ref Range   Glucose-Capillary 178 (H) 65 - 99 mg/dL  Glucose, capillary     Status: Abnormal   Collection Time: 05/01/17  4:34 PM  Result Value Ref Range   Glucose-Capillary 38 (LL) 65 - 99 mg/dL   Comment 1 Notify RN   Glucose, capillary     Status: None   Collection Time: 05/01/17  5:37 PM  Result Value Ref Range   Glucose-Capillary 77 65 - 99 mg/dL  Glucose, capillary     Status: Abnormal   Collection Time: 05/01/17  9:09 PM  Result Value Ref Range   Glucose-Capillary 144 (H) 65 - 99 mg/dL   Comment 1 Notify RN    Comment 2 Document in Chart    No results found.  Assessment/Plan: Diagnosis: Right AKA with history of left BKA Labs independently reviewed.  Records reviewed and summated above. Clean amputation daily with soap and water Monitor incision site for signs of infection or impending skin breakdown. Staples to remain in place for 3-4 weeks Stump shrinker, for edema control when VAC d/ced Scar mobilization massaging to prevent soft tissue adherence Stump protector during therapies Prevent flexion contractures by implementing the following:   Encourage prone lying for 20-30 mins per day BID to avoid hip flexion  Contractures if medically appropriate;  Avoid prolonged sitting Post surgical pain control with oral medication Phantom limb pain control with physical modalities including desensitization techniques (gentle self massage  to the residual stump,hot packs if sensation intact, [place]) and mirror therapy, TENS. If ineffective, consider pharmacological treatment for neuropathic pain (e.g gabapentin, pregabalin, amytriptalyine, duloxetine).   1. Does the need for close, 24 hr/day medical supervision in concert with the patient's rehab needs make it unreasonable for this patient to be served in a less intensive setting? Potentially  2. Co-Morbidities requiring supervision/potential complications: end-stage renal disease with peritoneal dialysis (recs per Nephro), COPD (monitor RR and O2 sats with increased physical activity), CAD with CABG/AICD (cont meds), brittle diabetes mellitus with hypoglycemia (Monitor in accordance with exercise and adjust meds as necessary), post-op pain management (Biofeedback training with therapies to help reduce reliance on opiate pain medications, monitor pain control during therapies, and sedation at rest and titrate to maximum efficacy to ensure participation and gains in therapies), Acute blood loss anemia (transfuse if necessary to ensure appropriate perfusion for increased activity tolerance) 3. Due to safety, skin/wound care, disease management, pain management and patient education, does the patient require 24 hr/day rehab nursing? Yes 4. Does the patient require coordinated care of a physician, rehab nurse, PT (1-2 hrs/day, 5 days/week) and OT (1-2 hrs/day, 5 days/week) to address physical and functional deficits in the context of the above medical diagnosis(es)? Potentially Addressing deficits in the following areas: balance, endurance, locomotion, strength, transferring, bathing, dressing, toileting and psychosocial support 5. Can the patient  actively participate in an intensive therapy program of at least 3 hrs of therapy per day at least 5 days per week? No 6. The potential for patient to make measurable gains while on inpatient rehab is NA 7. Anticipated functional outcomes upon discharge  from inpatient rehab are n/a  with PT, n/a with OT, n/a with SLP. 8. Estimated rehab length of stay to reach the above functional goals is: NA 9. Anticipated D/C setting: Home vs. SNF 10. Anticipated post D/C treatments: SNF vs HH 11. Overall Rehab/Functional Prognosis: good  RECOMMENDATIONS: This patient's condition is appropriate for continued rehabilitative care in the following setting: Pt well known to CIR.  Had lengthy discussion with wife.  Pt has had difficulty tolerating CIR in the past and has had repeated education.  Further, pt wishes to proceed with mobility in wheelchair.  Recommend SNF at discharge vs. HH with PM&R outpt follow up. Patient has agreed to participate in recommended program. Yes Note that insurance prior authorization may be required for reimbursement for recommended care.  Comment: Rehab Admissions Coordinator to follow up.  Maryla Morrow, MD, ABPMR Mcarthur Rossetti Angiulli, PA-C 05/02/2017

## 2017-05-02 NOTE — Care Management Important Message (Signed)
Important Message  Patient Details  Name: Joshua [name] MRN: [id number] Date of Birth: 04/10/1941   Medicare Important Message Given:  Yes    [name] 05/02/2017, 3:30 PM

## 2017-05-03 ENCOUNTER — Inpatient Hospital Stay (HOSPITAL_COMMUNITY): Payer: Medicare Other

## 2017-05-03 ENCOUNTER — Telehealth (INDEPENDENT_AMBULATORY_CARE_PROVIDER_SITE_OTHER): Payer: Self-pay | Admitting: Orthopedic Surgery

## 2017-05-03 DIAGNOSIS — Z9989 Dependence on other enabling machines and devices: Secondary | ICD-10-CM

## 2017-05-03 DIAGNOSIS — G4733 Obstructive sleep apnea (adult) (pediatric): Secondary | ICD-10-CM

## 2017-05-03 DIAGNOSIS — E1165 Type 2 diabetes mellitus with hyperglycemia: Secondary | ICD-10-CM

## 2017-05-03 DIAGNOSIS — I739 Peripheral vascular disease, unspecified: Secondary | ICD-10-CM

## 2017-05-03 DIAGNOSIS — N186 End stage renal disease: Secondary | ICD-10-CM

## 2017-05-03 DIAGNOSIS — IMO0002 Reserved for concepts with insufficient information to code with codable children: Secondary | ICD-10-CM

## 2017-05-03 DIAGNOSIS — I10 Essential (primary) hypertension: Secondary | ICD-10-CM | POA: Diagnosis present

## 2017-05-03 DIAGNOSIS — I2510 Atherosclerotic heart disease of native coronary artery without angina pectoris: Secondary | ICD-10-CM | POA: Diagnosis present

## 2017-05-03 DIAGNOSIS — R627 Adult failure to thrive: Secondary | ICD-10-CM | POA: Diagnosis present

## 2017-05-03 DIAGNOSIS — R410 Disorientation, unspecified: Secondary | ICD-10-CM | POA: Diagnosis present

## 2017-05-03 DIAGNOSIS — Z8679 Personal history of other diseases of the circulatory system: Secondary | ICD-10-CM

## 2017-05-03 DIAGNOSIS — I5042 Chronic combined systolic (congestive) and diastolic (congestive) heart failure: Secondary | ICD-10-CM | POA: Diagnosis present

## 2017-05-03 DIAGNOSIS — Z992 Dependence on renal dialysis: Secondary | ICD-10-CM | POA: Insufficient documentation

## 2017-05-03 DIAGNOSIS — Z89611 Acquired absence of right leg above knee: Secondary | ICD-10-CM

## 2017-05-03 DIAGNOSIS — Z794 Long term (current) use of insulin: Secondary | ICD-10-CM

## 2017-05-03 DIAGNOSIS — Z8719 Personal history of other diseases of the digestive system: Secondary | ICD-10-CM

## 2017-05-03 DIAGNOSIS — J449 Chronic obstructive pulmonary disease, unspecified: Secondary | ICD-10-CM | POA: Diagnosis present

## 2017-05-03 LAB — RENAL FUNCTION PANEL
Albumin: 1.5 g/dL — ABNORMAL LOW (ref 3.5–5.0)
Anion gap: 13 (ref 5–15)
BUN: 46 mg/dL — AB (ref 6–20)
CALCIUM: 8.4 mg/dL — AB (ref 8.9–10.3)
CHLORIDE: 92 mmol/L — AB (ref 101–111)
CO2: 24 mmol/L (ref 22–32)
CREATININE: 6.04 mg/dL — AB (ref 0.61–1.24)
GFR calc non Af Amer: 8 mL/min — ABNORMAL LOW (ref >60)
GFR, EST AFRICAN AMERICAN: 9 mL/min — AB (ref >60)
Glucose, Bld: 430 mg/dL — ABNORMAL HIGH (ref 65–99)
Phosphorus: 2.9 mg/dL (ref 2.5–4.6)
Potassium: 3.1 mmol/L — ABNORMAL LOW (ref 3.5–5.1)
SODIUM: 129 mmol/L — AB (ref 135–145)

## 2017-05-03 LAB — CBC
HCT: 31.2 % — ABNORMAL LOW (ref 39.0–52.0)
Hemoglobin: 9.9 g/dL — ABNORMAL LOW (ref 13.0–17.0)
MCH: 29.2 pg (ref 26.0–34.0)
MCHC: 31.7 g/dL (ref 30.0–36.0)
MCV: 92 fL (ref 78.0–100.0)
PLATELETS: 326 10*3/uL (ref 150–400)
RBC: 3.39 MIL/uL — ABNORMAL LOW (ref 4.22–5.81)
RDW: 15.2 % (ref 11.5–15.5)
WBC: 10.6 10*3/uL — ABNORMAL HIGH (ref 4.0–10.5)

## 2017-05-03 LAB — URINALYSIS, ROUTINE W REFLEX MICROSCOPIC
Bilirubin Urine: NEGATIVE
Ketones, ur: NEGATIVE mg/dL
Nitrite: NEGATIVE
PH: 5 (ref 5.0–8.0)
PROTEIN: 100 mg/dL — AB
Specific Gravity, Urine: 1.014 (ref 1.005–1.030)
Squamous Epithelial / LPF: NONE SEEN

## 2017-05-03 LAB — GLUCOSE, CAPILLARY
GLUCOSE-CAPILLARY: 225 mg/dL — AB (ref 65–99)
GLUCOSE-CAPILLARY: 245 mg/dL — AB (ref 65–99)
GLUCOSE-CAPILLARY: 209 mg/dL — AB (ref 65–99)
Glucose-Capillary: 429 mg/dL — ABNORMAL HIGH (ref 65–99)
Glucose-Capillary: 357 mg/dL — ABNORMAL HIGH (ref 65–99)

## 2017-05-03 MED ORDER — INSULIN ASPART 100 UNIT/ML ~~LOC~~ SOLN
0.0000 [IU] | Freq: Three times a day (TID) | SUBCUTANEOUS | Status: DC
  Administered 2017-05-03: 5 [IU] via SUBCUTANEOUS
  Administered 2017-05-04: 11 [IU] via SUBCUTANEOUS
  Administered 2017-05-04: 12 [IU] via SUBCUTANEOUS

## 2017-05-03 MED ORDER — DELFLEX-LC/1.5% DEXTROSE 344 MOSM/L IP SOLN
INTRAPERITONEAL | Status: DC
  Administered 2017-05-03: 5000 mL via INTRAPERITONEAL

## 2017-05-03 MED ORDER — DELFLEX-LC/2.5% DEXTROSE 394 MOSM/L IP SOLN
INTRAPERITONEAL | Status: DC
  Administered 2017-05-03: 20:00:00 via INTRAPERITONEAL

## 2017-05-03 MED ORDER — FENTANYL CITRATE (PF) 100 MCG/2ML IJ SOLN: 12.5000 ug | INTRAMUSCULAR | Status: DC | PRN

## 2017-05-03 MED ORDER — INSULIN NPH (HUMAN) (ISOPHANE) 100 UNIT/ML ~~LOC~~ SUSP
30.0000 [IU] | Freq: Two times a day (BID) | SUBCUTANEOUS | Status: DC
  Administered 2017-05-03 – 2017-05-04 (×2): 30 [IU] via SUBCUTANEOUS
  Filled 2017-05-03: qty 10

## 2017-05-03 MED ORDER — INSULIN ASPART 100 UNIT/ML ~~LOC~~ SOLN
0.0000 [IU] | Freq: Every day | SUBCUTANEOUS | Status: DC
Start: 2017-05-03 — End: 2017-05-04
  Administered 2017-05-03: 2 [IU] via SUBCUTANEOUS

## 2017-05-03 NOTE — Progress Notes (Signed)
Patient confused increasingly over course of today. Poorly interactive with PT. Now alert but oriented x 0 per RN. Per RN and wife grabbing at things not present. Visual hallucinations.

## 2017-05-03 NOTE — Telephone Encounter (Signed)
Patient's wife Burna MortimerWanda called advised [employer] is not a supplier for medicare for the wheelchair.  [place]. [place] [facility]. Burna MortimerWanda advised they need a Rx, notes and Demographics faxed to them. The fax# is [phone]. The ph# is [phone]. The number to contact Burna MortimerWanda is [phone]

## 2017-05-03 NOTE — Progress Notes (Signed)
Physical Therapy Treatment Patient Details Name: [name] MRN: [id number] DOB: [birth year] Today's Date: 05/03/2017    History of Present Illness Pt is a 77 y.o. male admitted for further amputation of his right limb. 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  He also has a BKA on the left.  Has complicated PMH including ESRD, ischemica cardiomyopathy, HTN, DM, PVD s/p AICD placement.     PT Comments    Patient remains confused this pm and requiring increased assist to sit EOB despite cues and assist for anterior weight shift.  Wife in room and assisting some with bed mobility.  Remains most appropriate for SNF level of care.  If home would need capable 24 hour assist, HHPT, HH aide, hoyer lift, hospital bed and slide board (if doesn't already have one).  Will continue skilled PT during acute stay.   Follow Up Recommendations  SNF;Supervision/Assistance - 24 hour     Equipment Recommendations  Other (comment)(hoyer lift)    Recommendations for Other Services       Precautions / Restrictions Precautions Precautions: Fall Precaution Comments: wound vac R residual limb Restrictions Weight Bearing Restrictions: Yes RLE Weight Bearing: Non weight bearing    Mobility  Bed Mobility Overal bed mobility: Needs Assistance Bed Mobility: Supine to Sit;Sit to Supine     Supine to sit: HOB elevated;Max assist Sit to supine: HOB elevated;Max assist;+2 for physical assistance   General bed mobility comments: scooted pt's hips to EOB with pad and assist to lift trunk.  To supine pt's wife assisted to scoot pt's hips back on bed and PT assisted trunk to supine; +2 A to scoot to [facility]  Transfers                    Ambulation/Gait                 Stairs            Wheelchair Mobility    Modified Rankin (Stroke Patients Only)       Balance Overall balance assessment: Needs assistance Sitting-balance support: Bilateral upper extremity  supported Sitting balance-Leahy Scale: Zero Sitting balance - Comments: max to mod A for sitting balance at EOB about 15 minutes while NP from critical care in to evaluate AMS.  MAx cues for anterior weight shift, chest and head up and for postural awareness Postural control: Posterior lean                                  Cognition Arousal/Alertness: Awake/alert Behavior During Therapy: WFL for tasks assessed/performed Overall Cognitive Status: Impaired/Different from baseline Area of Impairment: Orientation;Attention;Memory;Following commands;Problem solving                 Orientation Level: Disoriented to;Place;Time;Situation Current Attention Level: Focused Memory: Decreased short-term memory Following Commands: Follows one step commands with increased time;Follows one step commands inconsistently     Problem Solving: Slow processing;Decreased initiation;Difficulty sequencing;Requires verbal cues;Requires tactile cues General Comments: slow processing, increased time to respond and at times forgets activity      Exercises Amputee Exercises Towel Squeeze: AROM;10 reps;Both;Supine Straight Leg Raises: Left;Supine;10 reps    General Comments General comments (skin integrity, edema, etc.): wife present and assisting in session; reports not sure how she will get him into the car.  Discussed staff should assist to get in, but she will need assist to get him  out at home; states son in law should be able to help      Pertinent Vitals/Pain Pain Assessment: Faces Faces Pain Scale: Hurts even more Pain Location: right residual limb with movement Pain Descriptors / Indicators: Grimacing;Guarding;Sore Pain Intervention(s): Limited activity within patient's tolerance;Monitored during session;Repositioned    Home Living                      Prior Function            PT Goals (current goals can now be found in the care plan section) Progress towards PT  goals: Not progressing toward goals - comment(with acute mental status changes)    Frequency    Min 3X/week      PT Plan Current plan remains appropriate    Co-evaluation              AM-PAC PT "6 Clicks" Daily Activity  Outcome Measure  Difficulty turning over in bed (including adjusting bedclothes, sheets and blankets)?: Unable Difficulty moving from lying on back to sitting on the side of the bed? : Unable Difficulty sitting down on and standing up from a chair with arms (e.g., wheelchair, bedside commode, etc,.)?: Unable Help needed moving to and from a bed to chair (including a wheelchair)?: Total Help needed walking in hospital room?: Total Help needed climbing 3-5 steps with a railing? : Total 6 Click Score: 6    End of Session Equipment Utilized During Treatment: Gait belt Activity Tolerance: Patient limited by fatigue;Patient limited by pain Patient left: in bed;with bed alarm set;with family/visitor present   PT Visit Diagnosis: Muscle weakness (generalized) (M62.81);Difficulty in walking, not elsewhere classified (R26.2);Pain Pain - Right/Left: Right Pain - part of body: Leg     Time: 1450-1523 PT Time Calculation (min) (ACUTE ONLY): 33 min  Charges:  $Therapeutic Exercise: 8-22 mins $Therapeutic Activity: 8-22 mins                    G CodesSheran [name]:       [name], [place]PT [phone] 05/03/2017    [name] 05/03/2017, 5:16 PM

## 2017-05-03 NOTE — Progress Notes (Signed)
CBG 429 this morning. NP rounding on patient this morning notified. Patient received total of 12 units of novolog based on sliding scale and meal coverage combined. Report given to on coming day RN.

## 2017-05-03 NOTE — Progress Notes (Signed)
Physical Therapy Treatment Patient Details Name: [name] MRN: [id number] DOB: 06/02/1940 Today's Date: 05/03/2017    History of Present Illness Pt is a 77 y.o. male admitted for further amputation of his right limb. 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  He also has a BKA on the left.  Has complicated PMH including ESRD, ischemica cardiomyopathy, HTN, DM, PVD s/p AICD placement.     PT Comments    Patient continues to require max A for bed mobility, sitting balance EOB, and this am unable to transfer in part due to cognitive deficits. Pt demonstrated more confusion this session which wife reported started today. Pt is unable to follow commands for safe transfer and [place]. Pt reoriented during session. Pt continues to be most appropriate for post acute rehab however if pt does d/c home he will [facility] including PT and hoyer lift with amputation sling. Pt will also need ambulance transport home given pt was transferring in/out car with slide board PTA. This therapist spoke to CM about equipment needs if pt returns home. One more night stay may help given pt's confusion today. PT will continue to follow acutely.    Follow Up Recommendations  SNF;Supervision/Assistance - 24 hour     Equipment Recommendations  Other (comment) If pt is to d/c home pt [facility] and hoyer lift with amputation sling.    Recommendations for Other Services       Precautions / Restrictions Precautions Precautions: Fall Restrictions Weight Bearing Restrictions: Yes RLE Weight Bearing: Non weight bearing    Mobility  Bed Mobility Overal bed mobility: Needs Assistance Bed Mobility: Supine to Sit     Supine to sit: Max assist;+2 for physical assistance;HOB elevated Sit to supine: Max assist;+2 for physical assistance   General bed mobility comments: cues for sequencing and technique; HOB elevated; wife reported she  elevates the head of their adjustable bed and pt typically pulls himself up; pt required max A +2 with use of bed pad to get into sitting EOB  Transfers                 General transfer comment: attempted lateral scoot transfer however pt unsafe as he has difficulty following commands and began falling forward; pt returned to supine   Ambulation/Gait                 Stairs            Wheelchair Mobility    Modified Rankin (Stroke Patients Only)       Balance Overall balance assessment: Needs assistance Sitting-balance support: Bilateral upper extremity supported Sitting balance-Leahy Scale: Poor(zero intially) Sitting balance - Comments: max A required initially in sitting as pt has posterior bias with progression to min A  Postural control: Posterior lean                                  Cognition Arousal/Alertness: Awake/alert Behavior During Therapy: Restless Overall Cognitive Status: Impaired/Different from baseline Area of Impairment: Problem solving;Orientation;Attention;Memory;Following commands;Awareness                 Orientation Level: Time;Situation;Place Current Attention Level: Sustained Memory: Decreased short-term memory Following Commands: Follows one step commands inconsistently;Follows one step commands with increased time     Problem Solving: Slow processing;Decreased initiation;Difficulty sequencing;Requires verbal cues;Requires tactile cues  General Comments: pt confused during session which wife reported began this am; pt reaching for objects including this in therapist's pockets and asking "how do you lock this thing?"; pt believes he is at [employer] in [place]      Exercises      General Comments General comments (skin integrity, edema, etc.): wife present during session      Pertinent Vitals/Pain Pain Assessment: Faces Faces Pain Scale: Hurts little more Pain Location: right residual limb with  movement Pain Descriptors / Indicators: Grimacing;Guarding;Sore Pain Intervention(s): Limited activity within patient's tolerance;Monitored during session;Premedicated before session;Repositioned    Home Living                      Prior Function            PT Goals (current goals can now be found in the care plan section) Progress towards PT goals: Not progressing toward goals - comment(pt more confused today)    Frequency    Min 4X/week      PT Plan Discharge plan needs to be updated    Co-evaluation              AM-PAC PT "6 Clicks" Daily Activity  Outcome Measure  Difficulty turning over in bed (including adjusting bedclothes, sheets and blankets)?: Unable Difficulty moving from lying on back to sitting on the side of the bed? : Unable Difficulty sitting down on and standing up from a chair with arms (e.g., wheelchair, bedside commode, etc,.)?: Unable Help needed moving to and from a bed to chair (including a wheelchair)?: Total Help needed walking in hospital room?: Total Help needed climbing 3-5 steps with a railing? : Total 6 Click Score: 6    End of Session Equipment Utilized During Treatment: Gait belt Activity Tolerance: Other (comment);Patient limited by fatigue(pt limited by cognitive deficits ) Patient left: with call bell/phone within reach;with family/visitor present;in bed;with bed alarm set Nurse Communication: Mobility status;Need for lift equipment PT Visit Diagnosis: Muscle weakness (generalized) (M62.81);Difficulty in walking, not elsewhere classified (R26.2);Pain Pain - Right/Left: Right Pain - part of body: Leg     Time: 1610-9604 PT Time Calculation (min) (ACUTE ONLY): 23 min  Charges:  $Therapeutic Activity: 23-37 mins                    G Codes:       Erline Levine, PTA Pager: [phone]     Carolynne Edouard 05/03/2017, 1:06 PM

## 2017-05-03 NOTE — Telephone Encounter (Signed)
[facility] saying that the patient is needing a order for a mobilized wheelchair faxed to [employer] [facility] [phone]. They are needing the order/demographics and reason. If you have any questions call [phone]

## 2017-05-03 NOTE — Consult Note (Signed)
Consultation Note   DHIREN AZIMI BMW:[id number] DOB: [birth year] DOA: 04/28/2017   PCP: Dorice [name], MD   Attending/requesting physician: Lajoyce Corners  Consulting physician: Luberta Robertson  Reason for consultation: Acute delirium  HPI: [name] is a 77 y.o. 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  Patient was admitted to the hospital on 1/18 secondary to gangrenous dehiscence right transtibial amputation and subsequently underwent right AKA on date of admission.  From a surgical standpoint the patient's hospital course has been uneventful.  Patient did not meet criteria for rehabilitative therapies at [facility] and recalls he is on PD he was not a candidate for SNF placement.  Plans were to proceed with home health rehabilitative therapies.  Patient was noted to be more drowsy than baseline on 1/22 and both the orthopedic team and nephrology team suspected patient's altered mentation related to pain medications therefore drug dosages were adjusted i.e. decreased.  Patient remains confused today, has mild leukocytosis and poorly controlled diabetes therefore request has been made for internal medicine to assist with patient's acute delirium.  Review of Systems:  **Obtained from the medical record and patient's family member at bedside No Fever-chills, myalgias or other constitutional symptoms No Headache, changes with Vision or hearing, new weakness, tingling, numbness in any  extremity, dizziness, dysarthria or word finding difficulty, gait disturbance or imbalance, tremors or seizure activity No problems swallowing food or Liquids, indigestion/reflux, choking or coughing while eating, abdominal pain with or after eating No Chest pain, Cough or Shortness of Breath although family member noticed for the past 24 hours patient seemed to be more congested and breathing heavily at night, No palpitations, orthopnea or DOE No Abdominal pain, N/V, melena,hematochezia, dark tarry stools, constipation-patient has had multiple mucoid stools and apparently this was presumed related to Reglan for which has been discontinued No dysuria, malodorous urine, hematuria or flank pain No new skin rashes, lesions, masses or bruises, No new joint pains, aches, swelling or redness No recent unintentional weight gain or loss No polyuria, polydypsia or polyphagia   Past Medical History:  Diagnosis Date  . AICD (automatic cardioverter/defibrillator) present    [employer] [phone] lead [id number]  . Anemia   . Arthritis   . CHF (congestive heart failure) (HCC)   . Complication of anesthesia   . COPD (chronic obstructive pulmonary disease) (HCC)   . Coronary artery disease    CABG 2011  . Diabetes mellitus without complication (HCC)   . Dyspnea   . Dysrhythmia   . ESRD on peritoneal dialysis [facility])    Started PD Dec 2016 in [place]. Nephrology is in [employer]. CyCler- at night  . Foot infection 01/06/2017  . Gangrene (HCC)    left knee  . Gangrene of left foot (HCC) 11/26/2016  . Gangrene of right foot (HCC) 02/23/2017  . GERD (gastroesophageal reflux disease)   . Hemorrhoids   . History of peritonitis    PD cath related peritonitis in April 2017  . History of transmetatarsal amputation of left foot (HCC) 12/27/2016  . Hypertension   .  Occult blood in stools 04/2017  . Peripheral vascular disease (HCC)   . PONV (postoperative nausea and vomiting)   . Sjogren's  syndrome (HCC)    Per pt's wife, was diagnosed in 2016 during w/u for cause of renal failure prior to starting dialysis.  He had a rash on his chest apparently prompting this w/u.  Never had renal bx.  He was referred to a rheum MD in [place] and per the wife he was treated with MTX and other medications but had side effects to "all of it" and isn't taking anything for this as of Jun 2017.   [name] Sleep apnea    wears Bipap  . Stroke (HCC) 2016  . Wound dehiscence    Right foot    Past Surgical History:  Procedure Laterality Date  . ABDOMINAL AORTOGRAM W/LOWER EXTREMITY N/A 11/29/2016   Procedure: ABDOMINAL AORTOGRAM W/LOWER EXTREMITY;  Surgeon: Nada Libman, MD;  [facility];  Service: Cardiovascular;  Laterality: N/A;  . AMPUTATION Left 11/29/2016   Procedure: LEFT THIRD TOE AMPUTATION;  Surgeon: Nada Libman, MD;  Location: [facility] OR;  Service: Vascular;  Laterality: Left;  . AMPUTATION Left 12/23/2016   Procedure: Left Transmetatarsal Amputation;  Surgeon: Nadara Mustard, MD;  Location: [facility] OR;  Service: Orthopedics;  Laterality: Left;  . AMPUTATION Left 01/11/2017   Procedure: LEFT BELOW KNEE AMPUTATION;  Surgeon: Nadara Mustard, MD;  Location[facility] OR;  Service: Orthopedics;  Laterality: Left;  . AMPUTATION Right 01/20/2017   Procedure: AMPUTATION RIGHT FIFTH TOE;  Surgeon: Nada Libman, MD;  Location: [facility] OR;  Service: Vascular;  Laterality: Right;  . AMPUTATION Right 03/01/2017   Procedure: RIGHT BELOW KNEE AMPUTATION;  Surgeon: Nadara Mustard, MD;  Location: [facility] OR;  Service: Orthopedics;  Laterality: Right;  . AMPUTATION Right 04/28/2017   Procedure: RIGHT ABOVE KNEE AMPUTATION;  Surgeon: Nadara Mustard, MD;  Location: [facility] OR;  Service: Orthopedics;  Laterality: Right;  . ANGIOPLASTY  12/15/2016   Procedure: BALLOON ANGIOPLASTY;  Surgeon: Maeola Harman, MD;  Location: [facility] OR;  Service: Vascular;;  . cardiac catherization with stent placement  2017  .  COLONOSCOPY W/ BIOPSIES AND POLYPECTOMY    . CORONARY ARTERY BYPASS GRAFT  2011  . FALSE ANEURYSM REPAIR Right 12/15/2016   Procedure: REPAIR FALSE ANEURYSM;  Surgeon: Maeola Harman, MD;  Location: [facility] OR;  Service: Vascular;  Laterality: Right;  . IRRIGATION AND DEBRIDEMENT FOOT Left 12/15/2016   Procedure: IRRIGATION AND DEBRIDEMENT FOOT;  Surgeon: Maeola Harman, MD;  Location: [facility] OR;  Service: Vascular;  Laterality: Left;  . LEFT HEART CATH AND CORS/GRAFTS ANGIOGRAPHY N/A 01/09/2017   Procedure: LEFT HEART CATH AND CORS/GRAFTS ANGIOGRAPHY;  Surgeon: Runell Gess, MD;  [facility]: [facility];  Service: Cardiovascular;  Laterality: N/A;  . LOWER EXTREMITY ANGIOGRAM Left 12/15/2016   Procedure: LOWER EXTREMITY ANGIOGRAM; PEDAL ACCESS;  Surgeon: Maeola Harman, MD;  Location: [facility] OR;  Service: Vascular;  Laterality: Left;  . PERIPHERAL VASCULAR BALLOON ANGIOPLASTY  11/29/2016   Procedure: PERIPHERAL VASCULAR BALLOON ANGIOPLASTY;  Surgeon: Nada Libman, MD;  [facility];  Service: Cardiovascular;;  LT Peroneal AT attempted unsuccessful  . SIGMOIDECTOMY    . STUMP REVISION Left 03/01/2017   Procedure: REVISION LEFT BELOW KNEE AMPUTATION;  Surgeon: Nadara Mustard, MD;  Location: [facility] OR;  Service: Orthopedics;  Laterality: Left;    Social History   Socioeconomic History  . Marital status:  Married    Spouse name: Not on file  . Number of children: Not on file  . Years of education: Not on file  . Highest education level: Not on file  Social Needs  . Financial resource strain: Not on file  . Food insecurity - worry: Not on file  . Food insecurity - inability: Not on file  . Transportation needs - medical: Not on file  . Transportation needs - non-medical: Not on file  Occupational History  . Not on file  Tobacco Use  . Smoking status: Former Smoker    Types: Pipe  . Smokeless tobacco: Never Used  . Tobacco comment: 30 years ago  02/28/17  Substance and Sexual Activity  . Alcohol use: No  . Drug use: No  . Sexual activity: No  Other Topics Concern  . Not on file  Social History Narrative  . Not on file    Mobility: Wheelchair Work history: Not obtained   Allergies  Allergen Reactions  . Sulfa Antibiotics Hives  . Vancomycin Other (See Comments)    Blisters   . Zanaflex [Tizanidine Hcl] Other (See Comments)    Hypotensive stroke  . Gabapentin (Once-Daily) Hypertension  . Lisinopril Hives  . Methotrexate Derivatives Other (See Comments)    blisters  . Tape Hives  . Flexeril [Cyclobenzaprine] Other (See Comments)    somnolence    Family History  Problem Relation Age of Onset  . Heart disease Father   . Diabetes Mother      Prior to Admission medications   Medication Sig Start Date End Date Taking? Authorizing Provider  acetaminophen (TYLENOL) 500 MG tablet Take 1,000 mg by mouth 2 (two) times daily as needed for moderate pain or headache.   Yes [provider]  Amino Acids-Protein Hydrolys (FEEDING SUPPLEMENT, PRO-STAT SUGAR FREE 64,) LIQD Take 30 mLs by mouth 2 (two) times daily. 03/28/17  Yes Angiulli, Mcarthur Rossetti, PA-C  amiodarone (PACERONE) 200 MG tablet Take 1 tablet (200 mg total) by mouth 2 (two) times daily. 03/31/17  Yes Vassie Loll, MD  aspirin EC 81 MG tablet Take 81 mg by mouth daily.   Yes [provider]  atorvastatin (LIPITOR) 80 MG tablet Take 1 tablet (80 mg total) by mouth daily at 6 PM. 02/07/17  Yes Angiulli, Mcarthur Rossetti, PA-C  calcium acetate (PHOSLO) 667 MG capsule Take 1 capsule (667 mg total) by mouth 3 (three) times daily with meals. 02/07/17  Yes Angiulli, Mcarthur Rossetti, PA-C  carvedilol (COREG) 6.25 MG tablet Take 1 tablet (6.25 mg total) by mouth 2 (two) times daily with a meal. Please keep upcoming appt in January for future refills. Thank you 04/07/17  Yes Leone Brand, NP  clopidogrel (PLAVIX) 75 MG tablet Take 1 tablet (75 mg total) by mouth daily.  02/07/17  Yes Angiulli, Mcarthur Rossetti, PA-C  Darbepoetin Alfa (ARANESP) 200 MCG/0.4ML SOSY injection Inject 0.4 mLs (200 mcg total) into the skin every Saturday at 6 PM. 04/01/17  Yes Angiulli, Mcarthur Rossetti, PA-C  docusate sodium (COLACE) 100 MG capsule Take 100 mg by mouth 2 (two) times daily.    Yes [provider]  insulin aspart (NOVOLOG) 100 UNIT/ML injection Inject 0-8 Units into the skin 3 (three) times daily with meals. 03/28/17  Yes Angiulli, Mcarthur Rossetti, PA-C  insulin NPH Human (HUMULIN N,NOVOLIN N) 100 UNIT/ML injection Inject 0.3 mLs (30 Units total) into the skin 2 (two) times daily before a meal. 03/31/17  Yes Vassie Loll, MD  multivitamin (RENA-VIT)  TABS tablet Take 1 tablet by mouth at bedtime. 01/17/17  Yes Narda Bonds, MD  nitroGLYCERIN (NITROSTAT) 0.4 MG SL tablet Place 0.4 mg under the tongue every 5 (five) minutes as needed for chest pain.   Yes [provider]  Nutritional Supplements (FEEDING SUPPLEMENT, NEPRO CARB STEADY,) LIQD Take 237 mLs by mouth at bedtime. 03/31/17  Yes Vassie Loll, MD  oxyCODONE (OXY IR/ROXICODONE) 5 MG immediate release tablet Take 1 tablet (5 mg total) by mouth every 4 (four) hours as needed for severe pain. 05/01/17  Yes Barnie Del R, NP  polyethylene glycol (MIRALAX / GLYCOLAX) packet Take 17 g by mouth daily. Patient taking differently: Take 17 g by mouth daily as needed for mild constipation.  01/18/17  Yes Narda Bonds, MD  silver sulfADIAZINE (SILVADENE) 1 % cream Apply 1 application topically daily. 04/12/17  Yes Adonis Huguenin, NP  Vitamin D, Ergocalciferol, (DRISDOL) 50000 units CAPS capsule Take 50,000 Units by mouth every 30 (thirty) days.    Yes [provider]  clindamycin (CLEOCIN) 150 MG capsule Take 1 capsule (150 mg total) by mouth 3 (three) times daily. Patient not taking: Reported on 04/27/2017 04/12/17   Adonis Huguenin, NP    Physical Exam: Vitals:   05/02/17 2046 05/03/17 0408 05/03/17 0955 05/03/17 1400    BP: (!) 128/46 (!) 129/46 (!) 117/53 (!) 122/50  Pulse: 76 84 85 91  Resp: 18 20 18 18   Temp: 97.8 F (36.6 C) 99.5 F (37.5 C) 98.2 F (36.8 C) 99.1 F (37.3 C)  TempSrc: Oral Oral Oral Oral  SpO2: 97% 97% 97% 97%  Weight:   71.2 kg (156 lb 15.5 oz)   Height:          Constitutional: NAD, calm, uncomfortable 2/2 ongoing right stump pain Eyes: PERRL, lids and conjunctivae normal ENMT: Mucous membranes are dry. Posterior pharynx clear of any exudate or lesions. Neck: normal, supple, no masses, no thyromegaly Respiratory: Somewhat diminished and coarse throughout with expiratory wheeze/rhonchi left mid field.  Somewhat diminished respiratory effort without accessory muscle use rest.  PT at bedside-patient assisted with sitting on side of bed-pulse oximetry checked with initial readings 88% but improved to 94-95% when patient was instructed to take deep breath Cardiovascular: Regular rate and rhythm, no murmurs / rubs / gallops. No extremity edema. 2+ pedal pulses. No carotid bruits.  Abdomen: no tenderness, no masses palpated. No hepatosplenomegaly. Bowel sounds positive.  PD catheter in place. Musculoskeletal: no clubbing / cyanosis. No joint deformity upper and lower extremities. Good ROM, no contractures. Normal muscle tone.  Skin: no rashes, lesions, ulcers. No induration Neurologic: CN 2-12 grossly intact. Sensation intact, DTR normal. Strength 5/5 x all 4 extremities noting difficulty with checking right AKA extremity secondary to pain Psychiatric: Awake and oriented x name only (even when slowly given a list of places he was located he was on able to correctly identify hospital, he told me it was 1919, and again when given a list of whether it was morning, afternoon or nighttime and told it was 330 and daytime he was unable to clarify).  Flat affect and slow to respond when questions asked.    Labs on Admission: I have personally reviewed following labs and imaging  studies  CBC: Recent Labs  Lab 04/28/17 0800 05/01/17 0611 05/03/17 0651  WBC 8.1 10.0 10.6*  NEUTROABS  --  7.5  --   HGB 11.1* 9.8* 9.9*  HCT 35.2* 30.8* 31.2*  MCV 92.1 91.1  92.0  PLT 276 233 326   Basic Metabolic Panel: Recent Labs  Lab 04/28/17 0800 05/01/17 0611 05/03/17 0651  NA 135 130* 129*  K 3.9 2.8* 3.1*  CL 99* 93* 92*  CO2 22 25 24   GLUCOSE 316* 241* 430*  BUN 37* 47* 46*  CREATININE 6.48* 6.22* 6.04*  CALCIUM 8.9 8.3* 8.4*  PHOS  --  3.6 2.9   GFR: Estimated Creatinine Clearance: 10.4 mL/min (A) (by C-G formula based on SCr of 6.04 mg/dL (H)). Liver Function Tests: Recent Labs  Lab 04/28/17 0800 05/01/17 0611 05/03/17 0651  AST 35  --   --   ALT 24  --   --   ALKPHOS 95  --   --   BILITOT 0.5  --   --   PROT 5.8*  --   --   ALBUMIN 2.0* 1.6* 1.5*   No results for input(s): LIPASE, AMYLASE in the last 168 hours. No results for input(s): AMMONIA in the last 168 hours. Coagulation Profile: Recent Labs  Lab 04/28/17 0800  INR 1.13   Cardiac Enzymes: No results for input(s): CKTOTAL, CKMB, CKMBINDEX, TROPONINI in the last 168 hours. BNP (last 3 results) No results for input(s): PROBNP in the last 8760 hours. HbA1C: No results for input(s): HGBA1C in the last 72 hours. CBG: Recent Labs  Lab 05/02/17 1131 05/02/17 1729 05/02/17 2116 05/03/17 0631 05/03/17 1206  GLUCAP 189* 201* 225* 429* 357*   Lipid Profile: No results for input(s): CHOL, HDL, LDLCALC, TRIG, CHOLHDL, LDLDIRECT in the last 72 hours. Thyroid Function Tests: No results for input(s): TSH, T4TOTAL, FREET4, T3FREE, THYROIDAB in the last 72 hours. Anemia Panel: No results for input(s): VITAMINB12, FOLATE, FERRITIN, TIBC, IRON, RETICCTPCT in the last 72 hours. Urine analysis:    Component Value Date/Time   COLORURINE YELLOW 03/13/2017 1251   APPEARANCEUR CLOUDY (A) 03/13/2017 1251   LABSPEC 1.016 03/13/2017 1251   PHURINE 6.0 03/13/2017 1251   GLUCOSEU >=500 (A)  03/13/2017 1251   HGBUR MODERATE (A) 03/13/2017 1251   BILIRUBINUR NEGATIVE 03/13/2017 1251   KETONESUR NEGATIVE 03/13/2017 1251   PROTEINUR 100 (A) 03/13/2017 1251   NITRITE NEGATIVE 03/13/2017 1251   LEUKOCYTESUR LARGE (A) 03/13/2017 1251   Sepsis Labs: @LABRCNTIP (procalcitonin:4,lacticidven:4) )No results found for this or any previous visit (from the past 240 hour(s)).   Radiological Exams on Admission: No results found.  Assessment/Plan Principal Problem:   Acute delirium -Asked to evaluate patient with slightly greater than 24 hours of altered mentation/confusion in the postoperative setting who has been receiving IV/PO low-dose narcotics as well as Reglan -I have discontinued offending medications -We will utilize low-dose fentanyl IV since this is tolerated better by people with chronic kidney disease -Discussed with family at bedside and patient has not had issues with acute delirium or sundowning during previous hospitalizations although occasionally at home will become confused in the evening-likely has undiagnosed dementia -Possible infectious etiology given low-grade leukocytosis noting patient has received several doses of Ancef during the hospitalization which could be blunting immune response -Check respiratory viral panel, urinalysis with culture, blood cultures, cx PD dialysate fluid and obtain a chest x-ray (patient with what appeared to be hypoventilatory transient hypoxemia during my evaluation) -Agree with CT of head as ordered by orthopedic team -Continue PT/OT as above -Neurological checks every 4 hours -Patient has low-grade hyponatremia which is not out of the ordinary for him therefore do not think this is contributing to his altered mentation  Active Problems:  Insulin dependent type 2 diabetes mellitus, uncontrolled  -Patient documented as on NPH insulin 30 units twice daily at home but has been on Lantus 20 units subcu at at bedtime-resume home NPH -I  have adjusted SSI to moderate with HS coverage -Was on 3 times daily meal coverage prior to admission -HgbA1c was 8.8 on 04/28/17    History of peritonitis/ESRD on peritoneal dialysis  -Nocturnal exchanges and apparently tolerating without difficulty -Management per nephrology -For completeness of evaluation we are culturing PD dialysate    Hypertension -Current blood pressure well controlled on carvedilol    Chronic combined systolic and diastolic heart failure/Ischemic cardiomyopathy -Currently appears euvolemic with primary treatment modality nocturnal PD -Follow-up on chest x-ray -Last echocardiogram December 2018: Moderate concentric hypertrophy with EF 25-30%, grade 2 diastolic dysfunction without any significant valvular abnormalities -Daily weights, strict I's/O -No ACE inhibitor/ARB 2/2 chronic kidney disease -Continue carvedilol    Coronary artery disease -Currently denies chest pain -Continue Plavix and statin as well as carvedilol    FTT (failure to thrive) in adult -Family reports several months of poor oral intake that correlates with initiation of amiodarone -Continue pro-stat feeding supplementation as well as Nepro protein supplementation -Having mucoid stools without abdominal distention or pain-consider plain abdominal film to rule out obstipation    Peripheral vascular disease/S/P above knee amputation, right  -Management per orthopedic team -Wound VAC in place with reported noninfectious wound    COPD (chronic obstructive pulmonary disease)  -Currently stable without evidence of exacerbation -Monitor for wheezing; given history of atrial tachycardia requiring drug therapy recommend we utilize Xopenex as opposed to DuoNeb or plain albuterol if nebulizer therapy indicated    H/O atrial tachycardia -Amiodarone initiated by cardiology in December -No evidence of atrial fibrillation/ flutter -Currently rate controlled    OSA on CPAP -Has continue nocturnal  CPAP during current hospitalization     ELLIS,ALLISON L. ANP-BC [facility] Pager [phone]   If 7PM-7AM, please contact night-coverage [email or web address] Password [email or web address]  05/03/2017, 3:52 PM

## 2017-05-03 NOTE — Care Management Note (Signed)
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                   Action/Plan: Case manager has spoken over past several days with patient and wife concerning discharge plan.They are refusing SNF and prefer to go home with Home Health services. Wife is arranging for someone to assist her with getting patient out of the car when they arrive home. Choice for Home Health Agency has been offered, [facility] [place] at Case Manager faxed order, demographics [facility] at [phone].  Mrs. Tumblin has requested a power wheelchair,not a standard wheelchair.  This can not be ordered by Case manager, She will need script and documentation from MD office, CM will contact Dr. Audrie Liauda's office with [facility] Fax:[phone], phone [phone].     Expected Discharge Date:  05/03/17               Expected Discharge Plan:  Home w Home Health Services  In-House Referral:  Clinical Social Work  Discharge planning Services  CM Consult  Post Acute Care Choice:  Home Health, Durable Medical Equipment Choice offered to:  Spouse, Patient  DME Arranged:  Other see comment(Hoyer lift) DME Agency:  [facility]  HH Arranged:  PT, OT, Social Work [facility] Agency:  Other - See comment([facility])  Status of Service:  Completed, signed off  If discussed at Long Length of Stay Meetings, dates discussed:    Additional Comments:  [name], Joshua Amico Naomi, RN 05/03/2017, 2:19 PM

## 2017-05-03 NOTE — Progress Notes (Signed)
   Subjective:  Patient sitting up in bed eating breakfast. Feeling much better this morning. Wife at bedside.  Objective:   VITALS:   Vitals:   05/02/17 1447 05/02/17 1909 05/02/17 2046 05/03/17 0408  BP: (!) 120/48 (!) 124/49 (!) 128/46 (!) 129/46  Pulse: 78 83 76 84  Resp:  16 18 20   Temp: 99.3 F (37.4 C) 97.6 F (36.4 C) 97.8 F (36.6 C) 99.5 F (37.5 C)  TempSrc: Oral Oral Oral Oral  SpO2: 100% 97% 97% 97%  Weight:  156 lb 15.5 oz (71.2 kg)    Height:        Vac with good seal to fit and no new drainage in canister from right AKA stump.   Lab Results  Component Value Date   WBC 10.6 (H) 05/03/2017   HGB 9.9 (L) 05/03/2017   HCT 31.2 (L) 05/03/2017   MCV 92.0 05/03/2017   PLT 326 05/03/2017     Assessment/Plan:  5 Days Post-Op   - Expected postop acute blood loss anemia - will monitor for symptoms - Up with PT/OT - DVT ppx - SCDs, ambulation, aspirin, plavix - NWB operative extremity - Pain control, encouraged to ask for prn meds if needed - Discharge planning - Patient declined CIR, has been in past. Unable to go to SNF with peritoneal dialysis, case management to set up for Home health, will Discharge home - patient has been deconditioned post operatively, is unable to transfer on slide board, will have 2 more visits with PT then [facility] - will discontinue wound vac and have RN apply dry dressing prior to D/c  Adonis Hugueninrin R Zamora 05/03/2017, 8:37 AM [phone]

## 2017-05-03 NOTE — Progress Notes (Signed)
This evening received a call from Crystal in [facility] regarding substance that was tubed during day shift.  Item came with no paper work.  I was unable to assist since I am unaware of what was sent.

## 2017-05-03 NOTE — Progress Notes (Signed)
Results for [name] KitchenCOMPTON, Arjun D (MRN [id number]) as of 05/03/2017 12:50  Ref. Range 05/02/2017 11:31 05/02/2017 17:29 05/02/2017 21:16 05/03/2017 06:31 05/03/2017 12:06  Glucose-Capillary Latest Ref Range: 65 - 99 mg/dL 474189 (H) 259201 (H) 563225 (H) 429 (H) 357 (H)  Noted that blood sugar was 429 mg/dl this am.  Recommend increasing Lantus to 30 units daily. Will continue to monitor blood sugars while in the hospital.  Smith MinceKendra Brice Kossman RN BSN CDE Diabetes Coordinator Pager: [phone]  8am-5pm

## 2017-05-03 NOTE — Progress Notes (Signed)
[facility] Progress Note   Assessment/ Plan:    [facility] [place] [facility] 6 exchanges/day (5 on cycler) + one mid-day exchange (occurs around 9am).  2.5 liters of 2.5% per [facility] 2 liters of 2.5% for last fill and mid day drain. 1 hr 20 min dwell time Dr. Denny PeonBaveja.    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 04/28/17.  Plan is to go home with woundvac.    2 ESRD: Continue PD--> increasing exchanges in cycler and leaving off mid-day exchange.  All 1.5% for now as PO intake is poor. They reported swelling 1/21- mixed in a 2.5% bag- almost 1 liters of UF last night - wife fairly knowledgeable so will cont with routine at home.  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  Nutrition: albumin is 2.0---1.6.  Prostat.  Not eating well today. Throwing up every day- had endoscopy was negative- last BM was Thursday- made reglan scheduled - had diarrhea- change to BID - better  7.  Dispo: not sure at this time.  Hopefully home tonight or tomorrow   8. Hypokalemia due to vomiting - replete- improving- now eating better  9. Confusion- resolved   Subjective:    Less confused, not throwing up but some diarrhea-  PD went well , had almost 1 liter UF- may be going home   Objective:   BP (!) 129/46 (BP Location: Right Arm)   Pulse 84   Temp 99.5 F (37.5 C) (Oral)   Resp 20   Ht 5\' 9"  (1.753 m)   Wt 71.2 kg (156 lb 15.5 oz)   SpO2 97%   BMI 23.18 kg/m   Physical Exam: GEN older gentleman, appears uncomfortable HEENT EOMI, slightly disconjugate gaze NECK no JVD PULM clear bilaterally CV RRR no m/r/g ABD soft nontender, PD cath c/d/i EXT s/p L BKA and R AKA in woundvac, minimal serosang discharge- pitting  dep edema NEURO AAO x 3    Labs: BMET Recent Labs  Lab 04/28/17 0800 05/01/17 0611 05/03/17 0651  NA 135 130* 129*  K 3.9 2.8* 3.1*  CL 99* 93* 92*  CO2 22 25 24   GLUCOSE 316* 241* 430*  BUN 37* 47* 46*  CREATININE 6.48* 6.22* 6.04*  CALCIUM 8.9 8.3* 8.4*  PHOS  --  3.6 2.9   CBC Recent Labs  Lab 04/28/17 0800 05/01/17 0611 05/03/17 0651  WBC 8.1 10.0 10.6*  NEUTROABS  --  7.5  --   HGB 11.1* 9.8* 9.9*  HCT 35.2* 30.8* 31.2*  MCV 92.1 91.1 92.0  PLT 276 233 326    @IMGRELPRIORS @ Medications:    . amiodarone  200 mg Oral BID  . aspirin EC  81 mg Oral Daily  . atorvastatin  80 mg Oral q1800  . calcium acetate  667 mg Oral TID WC  . carvedilol  6.25 mg Oral BID WC  . clopidogrel  75 mg Oral Daily  . Darbepoetin Alfa  200 mcg Subcutaneous Q Sat-1800  . docusate sodium  100 mg Oral BID  . feeding supplement (NEPRO CARB STEADY)  237 mL Oral QHS  . feeding supplement (PRO-STAT SUGAR FREE 64)  30 mL Oral BID  .  gentamicin cream  1 application Topical Daily  . insulin aspart  0-9 Units Subcutaneous TID WC  . insulin aspart  3 Units Subcutaneous TID WC  . insulin glargine  20 Units Subcutaneous QHS  . metoCLOPramide (REGLAN) injection  5 mg Intravenous Q12H  . multivitamin  1 tablet Oral QHS  . potassium chloride  20 mEq Oral BID     [name] A  05/03/2017, 9:24 AM

## 2017-05-03 NOTE — Progress Notes (Signed)
Pt has become quite confused.  Pt not oriented to self, location, or time and slightly agitated.  Wife also states that pt has been grabbing in the air for things that aren't there.  NP notified.  Will continue to monitor.  [name] MoHector Shad[place] Village[place]

## 2017-05-04 DIAGNOSIS — E1165 Type 2 diabetes mellitus with hyperglycemia: Secondary | ICD-10-CM

## 2017-05-04 DIAGNOSIS — Z8719 Personal history of other diseases of the digestive system: Secondary | ICD-10-CM

## 2017-05-04 DIAGNOSIS — I5042 Chronic combined systolic (congestive) and diastolic (congestive) heart failure: Secondary | ICD-10-CM

## 2017-05-04 DIAGNOSIS — R410 Disorientation, unspecified: Secondary | ICD-10-CM

## 2017-05-04 DIAGNOSIS — Z794 Long term (current) use of insulin: Secondary | ICD-10-CM

## 2017-05-04 LAB — RESPIRATORY PANEL BY PCR
ADENOVIRUS-RVPPCR: NOT DETECTED
Bordetella pertussis: NOT DETECTED
CORONAVIRUS 229E-RVPPCR: NOT DETECTED
CORONAVIRUS NL63-RVPPCR: NOT DETECTED
CORONAVIRUS OC43-RVPPCR: NOT DETECTED
Chlamydophila pneumoniae: NOT DETECTED
Coronavirus HKU1: NOT DETECTED
INFLUENZA B-RVPPCR: NOT DETECTED
Influenza A: NOT DETECTED
METAPNEUMOVIRUS-RVPPCR: NOT DETECTED
Mycoplasma pneumoniae: NOT DETECTED
PARAINFLUENZA VIRUS 1-RVPPCR: NOT DETECTED
PARAINFLUENZA VIRUS 2-RVPPCR: NOT DETECTED
PARAINFLUENZA VIRUS 4-RVPPCR: NOT DETECTED
Parainfluenza Virus 3: NOT DETECTED
RESPIRATORY SYNCYTIAL VIRUS-RVPPCR: NOT DETECTED
Rhinovirus / Enterovirus: NOT DETECTED

## 2017-05-04 LAB — GLUCOSE, CAPILLARY
Glucose-Capillary: 412 mg/dL — ABNORMAL HIGH (ref 65–99)
Glucose-Capillary: 403 mg/dL — ABNORMAL HIGH (ref 65–99)
Glucose-Capillary: 328 mg/dL — ABNORMAL HIGH (ref 65–99)

## 2017-05-04 LAB — GLUCOSE, RANDOM: Glucose, Bld: 441 mg/dL — ABNORMAL HIGH (ref 65–99)

## 2017-05-04 MED ORDER — CEFDINIR 125 MG/5ML PO SUSR
250.0000 mg | Freq: Every day | ORAL | Status: DC
  Administered 2017-05-04: 250 mg via ORAL
  Filled 2017-05-04: qty 10

## 2017-05-04 MED ORDER — OXYCODONE HCL 5 MG PO TABS: 5.0000 mg | ORAL_TABLET | Freq: Four times a day (QID) | ORAL | 0 refills | Status: AC | PRN

## 2017-05-04 MED ORDER — POTASSIUM CHLORIDE CRYS ER 10 MEQ PO TBCR: 10.0000 meq | EXTENDED_RELEASE_TABLET | Freq: Every day | ORAL | 0 refills | Status: AC

## 2017-05-04 MED ORDER — POTASSIUM CHLORIDE CRYS ER 10 MEQ PO TBCR
10.0000 meq | EXTENDED_RELEASE_TABLET | Freq: Every day | ORAL | Status: DC
  Administered 2017-05-04: 10 meq via ORAL
  Filled 2017-05-04: qty 1

## 2017-05-04 MED ORDER — INSULIN NPH (HUMAN) (ISOPHANE) 100 UNIT/ML ~~LOC~~ SUSP: 30.0000 [IU] | Freq: Two times a day (BID) | SUBCUTANEOUS | 11 refills | Status: AC

## 2017-05-04 MED ORDER — CEFDINIR 125 MG/5ML PO SUSR: 250.0000 mg | Freq: Every day | ORAL | 0 refills | Status: AC

## 2017-05-04 NOTE — Progress Notes (Signed)
Pt discharged home with wife after review from [name], Dr. Jarvis NewcomerGrunz and Dr. Kathrene BongoGoldsborough this morning. AVS and script reviewed and given to patient. Pt and wife aware that some scripts were called into their pharmacy. Home health set up by case manager. All belongings sent with patient. VSS. BP (!) 122/53 (BP Location: Right Arm)   Pulse 74   Temp 98.1 F (36.7 C) (Oral)   Resp 16   Ht 5\' 9"  (1.753 m)   Wt 70.8 kg (156 lb 1.4 oz)   SpO2 98%   BMI 23.05 kg/m

## 2017-05-04 NOTE — Progress Notes (Signed)
Physical Therapy Treatment Patient Details Name: [name] MRN: [id number] DOB: 13-Oct-1940 Today's Date: 05/04/2017    History of Present Illness Pt is a 77 y.o. male admitted for further amputation of his right limb. 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  He also has a BKA on the left.  Has complicated PMH including ESRD, ischemica cardiomyopathy, HTN, DM, PVD s/p AICD placement.     PT Comments    Patient, cognitively, is back to baseline and this session focused on functional transfers and sitting balance EOB. Pt continues to require max A +2 for slide board transfer this session. Wife assisted with all mobility in preparation for return home and pt's wife educated in body mechanics and techniques for assisting pt with bed mobility and transfers safely. Continue to progress as tolerated.    Follow Up Recommendations  SNF;Supervision/Assistance - 24 hour     Equipment Recommendations  Other (comment)(hoyer lift)    Recommendations for Other Services Rehab consult     Precautions / Restrictions Precautions Precautions: Fall Restrictions Weight Bearing Restrictions: Yes RLE Weight Bearing: Non weight bearing    Mobility  Bed Mobility Overal bed mobility: Needs Assistance Bed Mobility: Supine to Sit     Supine to sit: HOB elevated;Mod assist     General bed mobility comments: pt's wife educated on body mechanics and technique for assisting to sit pt up EOB; cues for sequencing and hand placement with assist to bring hips to EOB and to elevate trunk into sitting  Transfers Overall transfer level: Needs assistance   Transfers: Lateral/Scoot Transfers          Lateral/Scoot Transfers: Max assist;+2 physical assistance;From elevated surface;With slide board General transfer comment: max cues for maintaining anterior translation of trunk, bilat hand positioning, sequencing, and R hip extension when ins itting to aid in sitting balance; assist to scoot EOB  to recliner with use of gait belt and bed pad  Ambulation/Gait             General Gait Details: N/A   Stairs            Wheelchair Mobility    Modified Rankin (Stroke Patients Only)       Balance Overall balance assessment: Needs assistance Sitting-balance support: Bilateral upper extremity supported Sitting balance-Leahy Scale: Poor Sitting balance - Comments: mod/max A required initially and with increased time and max cues pt able to maintain sitting balance with min guard assist Postural control: Posterior lean                                  Cognition Arousal/Alertness: Awake/alert Behavior During Therapy: WFL for tasks assessed/performed Overall Cognitive Status: Within Functional Limits for tasks assessed                               Problem Solving: Requires verbal cues;Requires tactile cues        Exercises      General Comments General comments (skin integrity, edema, etc.): wife present and assisted with all mobility to prepare for transition back home      Pertinent Vitals/Pain Pain Assessment: Faces Faces Pain Scale: Hurts even more Pain Location: right residual limb  Pain Descriptors / Indicators: Grimacing;Guarding;Sore Pain Intervention(s): Monitored during session;Premedicated before session;Repositioned    Home Living  Prior Function            PT Goals (current goals can now be found in the care plan section) Acute Rehab PT Goals PT Goal Formulation: With patient/family Time For Goal Achievement: 05/13/17 Potential to Achieve Goals: Good Progress towards PT goals: Progressing toward goals    Frequency    Min 3X/week      PT Plan Current plan remains appropriate    Co-evaluation              AM-PAC PT "6 Clicks" Daily Activity  Outcome Measure  Difficulty turning over in bed (including adjusting bedclothes, sheets and blankets)?: Unable Difficulty  moving from lying on back to sitting on the side of the bed? : Unable Difficulty sitting down on and standing up from a chair with arms (e.g., wheelchair, bedside commode, etc,.)?: Unable Help needed moving to and from a bed to chair (including a wheelchair)?: A Lot Help needed walking in hospital room?: Total Help needed climbing 3-5 steps with a railing? : Total 6 Click Score: 7    End of Session Equipment Utilized During Treatment: Gait belt Activity Tolerance: Patient tolerated treatment well Patient left: with family/visitor present;in chair;with call bell/phone within reach Nurse Communication: Mobility status;Other (comment)(using gait belt and technique for car transfer) PT Visit Diagnosis: Muscle weakness (generalized) (M62.81);Difficulty in walking, not elsewhere classified (R26.2);Pain Pain - Right/Left: Right Pain - part of body: Leg     Time: [id number] PT Time Calculation (min) (ACUTE ONLY): 45 min  Charges:  $Therapeutic Activity: 38-52 mins                    G Codes:       [name], PTA Pager: [phone]     [name] 05/04/2017, 2:53 PM

## 2017-05-04 NOTE — Progress Notes (Signed)
[facility] PROGRESS NOTE  [name]  RUE:[id number] DOB: 1940/06/09 DOA: 04/28/2017 PCP: Dorice Lamashivianathan, Susan S, MD Outpatient Specialists: [facility]; Orthopedics, [name].  Brief Narrative: [name] is a 77 y.o. 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 Post operative course was complicated by delirium, so hospitalists consulted. This seems to have improve with decreasing pain medications and continued with a waxing/waning course most consistent with delirium. UA showed pyuria and abx are started for this.   Subjective: Pt's pain is controlled and mental status has improved dramatically from yesterday. Pt's wife reports intermittent red blood in formed stools that has been plaguing him for months. No dyspnea, chest pain, lightheadedness/dizziness, or other bleeding noted. Patient was discharged by orthopedics this morning.   Objective: BP (!) 120/49 (BP Location: Right Arm)   Pulse 70   Temp 98 F (36.7 C) (Oral)   Resp 18   Ht 5\' 9"  (1.753 m)   Wt 70.8 kg (156 lb 1.4 oz)   SpO2 98%   BMI 23.05 kg/m   Gen: Chronically ill-appearing 77yo M sitting on bedside chair.  Pulm: Clear and nonlabored on room air.   CV: RRR, no murmur, no JVD, no edema GI: Soft, NT, ND, +BS. Left PD catheter site appears normal Neuro: Alert and oriented, conversant. Much improved from prior exam as reported. No focal deficits. Ext: Right AKA stump wrapped, no surrounding erythema. Left AKA site normal.   Assessment & Plan: Acute delirium: Resolving.  - Recommend discharge to familiar location, continue delirium precautions as reviewed with wife today.  - Treat pyuria as below - Continue  pain medications per orthopedics at lower dose.   Pyuria: Nephrology recommends Tx.  - cefdinir x5 days at renal dosing prescribed at discharge.   BRBPR: Chronic problem with recent colonoscopy showing sigmoid diverticulosis, internal hemorrhoids which are likely the cause. Not large volume bleeding or symptomatic anemia, and hgb is stable.  - Follow up with GI as outpatient.  - Strict return precautions were provided for patient/wife.   PVD s/p recent BKA revision to AKA on right. Postoperative course complicated by delirium which is improved with reduced pain medications.   ESRD on PD:  - Per nephrology, will also prescribe 10mEq K supplement per their recommendations.   Poorly controlled IDDM with hyperglycemia: No anion gap or ketonuria.  - Continue insulin as ordered.   Continue treatment for other chronic conditions.  Hazeline Junkeryan Teri Legacy, MD [facility]

## 2017-05-04 NOTE — Progress Notes (Addendum)
CRITICAL VALUE ALERT  Critical Value:  CBG 412, recheck at 0810 was 403.  Date & Time Notied:  05/04/17 @ 0812  Provider Notified: Paged [name]  Orders Received/Actions taken: Order in for STAT glucose by lab per protocol.   05/04/17 @ 0830 - Spoke to BarlingDuda, MD he states to give patient 12 units of humolog sliding scale coverage.

## 2017-05-04 NOTE — Progress Notes (Signed)
Pt is to be discharged home today per [name]. However, pt's blood sugar was elevated this AM to 412 and a STAT lab glucose was obtained which resulted at 441. [name] stated to defer medical questions to the consulted hospitalist team, Dr. Jarvis NewcomerGrunz was informed and stated that patient came in with blood sugar in the upper 300's so this is most likely his baseline, no new orders. Blood was found in stool this AM by patient's wife, she states she first noticed some "spotting" 2 days ago but never this much (was lighter red with a couple pea-sized clots), also informed Dr. Jarvis NewcomerGrunz. Pt is to be discharged home today with transportation provided by wife, however, pt unable to hold himself up or do slide transfers during therapy yesterday. Will make sure patient sees therapy and is safe to transport with wife prior to discharging patient.

## 2017-05-04 NOTE — Plan of Care (Signed)
  Progressing Health Behavior/Discharge Planning: Ability to manage health-related needs will improve 05/04/2017 1114 - Progressing by Quentin CornwallMadison, Laryah Neuser, RN Clinical Measurements: Ability to maintain clinical measurements within normal limits will improve 05/04/2017 1114 - Progressing by Quentin CornwallMadison, Johnwilliam Shepperson, RN Will remain free from infection 05/04/2017 1114 - Progressing by Quentin CornwallMadison, Palmina Clodfelter, RN Diagnostic test results will improve 05/04/2017 1114 - Progressing by Quentin CornwallMadison, Kip Cropp, RN Respiratory complications will improve 05/04/2017 1114 - Progressing by Quentin CornwallMadison, Madelina Sanda, RN Cardiovascular complication will be avoided 05/04/2017 1114 - Progressing by Quentin CornwallMadison, Jermika Olden, RN Activity: Risk for activity intolerance will decrease 05/04/2017 1114 - Progressing by Quentin CornwallMadison, Berlynn Warsame, RN Nutrition: Adequate nutrition will be maintained 05/04/2017 1114 - Progressing by Quentin CornwallMadison, Armin Yerger, RN Coping: Level of anxiety will decrease 05/04/2017 1114 - Progressing by Quentin CornwallMadison, Jay Haskew, RN Elimination: Will not experience complications related to bowel motility 05/04/2017 1114 - Progressing by Quentin CornwallMadison, Tomiko Schoon, RN Will not experience complications related to urinary retention 05/04/2017 1114 - Progressing by Quentin CornwallMadison, Charod Slawinski, RN Pain Managment: General experience of comfort will improve 05/04/2017 1114 - Progressing by Quentin CornwallMadison, Keonna Raether, RN Safety: Ability to remain free from injury will improve 05/04/2017 1114 - Progressing by Quentin CornwallMadison, Yicel Shannon, RN Skin Integrity: Risk for impaired skin integrity will decrease 05/04/2017 1114 - Progressing by Quentin CornwallMadison, Cam Harnden, RN

## 2017-05-04 NOTE — Progress Notes (Signed)
[facility] Progress Note   Assessment/ Plan:    Center: [place] [facility] 6 exchanges/day (5 on cycler) + one mid-day exchange (occurs around 9am).  2.5 liters of 2.5% per [facility] 2 liters of 2.5% for last fill and mid day drain. 1 hr 20 min dwell time Dr. Denny Peon.    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 Lajoyce Corners 04/28/17.  Plan is to go home with woundvac.    2 ESRD: Continue PD--> increasing exchanges in cycler and leaving off mid-day exchange.  Previously 1.5%  as PO intake was poor. They reported swelling 1/21- mixed in a 2.5% bag- almost 1 liters of UF daily- wife fairly knowledgeable so will cont with routine at home.  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  Nutrition: albumin is 2.0---1.6.  Prostat.  Not eating well today. Throwing up every day- had endoscopy was negative- last BM was Thursday- made reglan scheduled - had diarrhea- change to BID - better  7.  Dispo: not sure at this time.  Hopefully home tonight or tomorrow   8. Hypokalemia due to vomiting - replete- improving- now eating better- would feel better to send out on 10 meq of kcl daily   9. Confusion- resolved- U/A is suggestive of UTI but difficult to tell in ESRD pt-  To be safe could cover with a cephalosporin short term orally    Subjective:    Hospitalist consulted for delerium- undergoing infection work up - fluid clear so dont suspect peritonitis.  He is    Objective:   BP (!) 120/49 (BP Location: Right Arm)   Pulse 70   Temp 98 F (36.7 C) (Oral)   Resp 18   Ht 5\' 9"  (1.753 m)   Wt 70.8 kg (156 lb 1.4 oz)   SpO2 98%   BMI 23.05 kg/m   Physical Exam: GEN older gentleman, appears uncomfortable HEENT EOMI, slightly  disconjugate gaze NECK no JVD PULM clear bilaterally CV RRR no m/r/g ABD soft nontender, PD cath c/d/i EXT s/p L BKA and R AKA in woundvac, minimal serosang discharge- pitting dep edema NEURO AAO x 3    Labs: BMET Recent Labs  Lab 04/28/17 0800 05/01/17 0611 05/03/17 0651  NA 135 130* 129*  K 3.9 2.8* 3.1*  CL 99* 93* 92*  CO2 22 25 24   GLUCOSE 316* 241* 430*  BUN 37* 47* 46*  CREATININE 6.48* 6.22* 6.04*  CALCIUM 8.9 8.3* 8.4*  PHOS  --  3.6 2.9   CBC Recent Labs  Lab 04/28/17 0800 05/01/17 0611 05/03/17 0651  WBC 8.1 10.0 10.6*  NEUTROABS  --  7.5  --   HGB 11.1* 9.8* 9.9*  HCT 35.2* 30.8* 31.2*  MCV 92.1 91.1 92.0  PLT 276 233 326    @IMGRELPRIORS @ Medications:    . amiodarone  200 mg Oral BID  . aspirin EC  81 mg Oral Daily  . atorvastatin  80 mg Oral q1800  . calcium acetate  667 mg Oral TID WC  . carvedilol  6.25 mg Oral BID WC  . clopidogrel  75 mg Oral Daily  . Darbepoetin Alfa  200 mcg Subcutaneous Q Sat-1800  .  docusate sodium  100 mg Oral BID  . feeding supplement (NEPRO CARB STEADY)  237 mL Oral QHS  . feeding supplement (PRO-STAT SUGAR FREE 64)  30 mL Oral BID  . gentamicin cream  1 application Topical Daily  . insulin aspart  0-15 Units Subcutaneous TID WC  . insulin aspart  0-5 Units Subcutaneous QHS  . insulin aspart  3 Units Subcutaneous TID WC  . insulin NPH Human  30 Units Subcutaneous BID AC & HS  . multivitamin  1 tablet Oral QHS     Ayen Viviano A  05/04/2017, 9:31 AM

## 2017-05-04 NOTE — Discharge Summary (Signed)
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 male who was admitted 04/28/2017 with a chief complaint of dehiscence right transtibial amputation, with a final diagnosis of Gangrene Right Below Knee Amputation.  Patient was brought to the operating room on 04/28/2017 and underwent Procedure(s): RIGHT ABOVE KNEE AMPUTATION.    Patient was given perioperative antibiotics:  Anti-infectives (From admission, onward)   Start     Dose/Rate Route Frequency Ordered Stop   04/28/17 2200  ceFAZolin (ANCEF) IVPB 1 g/50 mL premix     1 g 100 mL/hr over 30 Minutes Intravenous Every 12 hours 04/28/17 1242 04/29/17 0036   04/28/17 0848  ceFAZolin (ANCEF) 2-4 GM/100ML-% IVPB    Comments:  Posey Pronto   : cabinet override      04/28/17 0848 04/28/17 1014   04/28/17 0845  ceFAZolin (ANCEF) IVPB 2g/100 mL premix     2 g 200 mL/hr over 30 Minutes Intravenous On call to O.R. 04/28/17 0845 04/28/17 1014    .  Patient was given sequential compression devices, early ambulation, and aspirin for DVT prophylaxis.  Recent vital signs:  Patient Vitals for the past 24 hrs:  BP Temp Temp src Pulse Resp SpO2 Weight  05/03/17 1900 (!) 120/49 98 F (36.7 C) Oral 70 18 98 % -  05/03/17 1837 117/61 97.7 F (36.5  C) Oral 79 17 98 % -  05/03/17 1820 - - - - - - 156 lb 1.4 oz (70.8 kg)  05/03/17 1400 (!) 122/50 99.1 F (37.3 C) Oral 91 18 97 % -  05/03/17 0955 (!) 117/53 98.2 F (36.8 C) Oral 85 18 97 % 156 lb 15.5 oz (71.2 kg)  .  Recent laboratory studies: Ct Head Wo Contrast  Result Date: 05/03/2017 CLINICAL DATA:  Altered mental status today. EXAM: CT HEAD WITHOUT CONTRAST TECHNIQUE: Contiguous axial images were obtained from the base of the skull through the vertex without intravenous contrast. COMPARISON:  Head CT scan 01/02/2017. FINDINGS: Brain: Atrophy and chronic microvascular ischemic change are again seen. No evidence of acute abnormality including hemorrhage, infarct, mass lesion, mass effect, midline shift or abnormal extra-axial fluid collection. No hydrocephalus or pneumocephalus. Vascular: Atherosclerosis noted. Skull: Intact. Sinuses/Orbits: Negative. Other: None. IMPRESSION: No acute finding. Atrophy and chronic microvascular ischemic change. Atherosclerosis. Electronically Signed   By: Drusilla Kanner M.D.   On: 05/03/2017 16:01   Dg Chest Port 1 View  Result Date: 05/03/2017 CLINICAL DATA:  Shortness of breath.  Leukocytosis. EXAM: PORTABLE CHEST 1 VIEW COMPARISON:  03/12/2017 FINDINGS: A defibrillator lead again projects over the left chest. Sequelae of prior CABG are again identified. The cardiac silhouette remains mildly enlarged. Aortic atherosclerosis is noted. Pulmonary vascular congestion on the prior study has mildly decreased. No overt pulmonary edema, lobar consolidation, sizeable pleural effusion, pneumothorax is identified.  No acute osseous abnormality is seen. IMPRESSION: Cardiomegaly with decreased pulmonary vascular congestion. Electronically Signed   By: Sebastian AcheAllen  Grady M.D.   On: 05/03/2017 16:34    Discharge Medications:   Allergies as of 05/04/2017      Reactions   Sulfa Antibiotics Hives   Vancomycin Other (See Comments)   Blisters   Zanaflex [tizanidine Hcl] Other  (See Comments)   Hypotensive stroke   Gabapentin (once-daily) Hypertension   Lisinopril Hives   Methotrexate Derivatives Other (See Comments)   blisters   Tape Hives   Flexeril [cyclobenzaprine] Other (See Comments)   somnolence      Medication List    STOP taking these medications   clindamycin 150 MG capsule Commonly known as:  CLEOCIN   silver sulfADIAZINE 1 % cream Commonly known as:  SILVADENE     TAKE these medications   acetaminophen 500 MG tablet Commonly known as:  TYLENOL Take 1,000 mg by mouth 2 (two) times daily as needed for moderate pain or headache.   amiodarone 200 MG tablet Commonly known as:  PACERONE Take 1 tablet (200 mg total) by mouth 2 (two) times daily.   aspirin EC 81 MG tablet Take 81 mg by mouth daily.   atorvastatin 80 MG tablet Commonly known as:  LIPITOR Take 1 tablet (80 mg total) by mouth daily at 6 PM.   calcium acetate 667 MG capsule Commonly known as:  PHOSLO Take 1 capsule (667 mg total) by mouth 3 (three) times daily with meals.   carvedilol 6.25 MG tablet Commonly known as:  COREG Take 1 tablet (6.25 mg total) by mouth 2 (two) times daily with a meal. Please keep upcoming appt in January for future refills. Thank you   clopidogrel 75 MG tablet Commonly known as:  PLAVIX Take 1 tablet (75 mg total) by mouth daily.   Darbepoetin Alfa 200 MCG/0.4ML Sosy injection Commonly known as:  ARANESP Inject 0.4 mLs (200 mcg total) into the skin every Saturday at 6 PM.   docusate sodium 100 MG capsule Commonly known as:  COLACE Take 100 mg by mouth 2 (two) times daily.   feeding supplement (NEPRO CARB STEADY) Liqd Take 237 mLs by mouth at bedtime.   feeding supplement (PRO-STAT SUGAR FREE 64) Liqd Take 30 mLs by mouth 2 (two) times daily.   insulin aspart 100 UNIT/ML injection Commonly known as:  novoLOG Inject 0-8 Units into the skin 3 (three) times daily with meals.   insulin NPH Human 100 UNIT/ML injection Commonly known  as:  HUMULIN N,NOVOLIN N Inject 0.3 mLs (30 Units total) into the skin 2 (two) times daily before a meal. What changed:  Another medication with the same name was added. Make sure you understand how and when to take each.   insulin NPH Human 100 UNIT/ML injection Commonly known as:  HUMULIN N,NOVOLIN N Inject 0.3 mLs (30 Units total) into the skin 2 (two) times daily at 8 am and 10 pm. What changed:  You were already taking a medication with the same name, and this prescription was added. Make sure you understand how and when to take each.   multivitamin Tabs tablet Take 1 tablet by mouth at bedtime.   nitroGLYCERIN 0.4 MG SL tablet Commonly known as:  NITROSTAT Place 0.4 mg under the tongue every 5 (five) minutes as needed for chest pain.   oxyCODONE 5 MG immediate release tablet Commonly known as:  Oxy IR/ROXICODONE Take 1 tablet (5 mg total) by mouth every 6 (  six) hours as needed for severe pain.   polyethylene glycol packet Commonly known as:  MIRALAX / GLYCOLAX Take 17 g by mouth daily. What changed:    when to take this  reasons to take this   Vitamin D (Ergocalciferol) 50000 units Caps capsule Commonly known as:  DRISDOL Take 50,000 Units by mouth every 30 (thirty) days.            Durable Medical Equipment  (From admission, onward)        Start     Ordered   05/03/17 1431  For home use only DME lightweight manual wheelchair with seat cushion  Once    Comments:  Patient suffers from bilateral lower extremity amputations which impairs their ability to perform daily activities like bathing in the home.  A walker will not resolve  issue with performing activities of daily living. A wheelchair will allow patient to safely perform daily activities. Patient is not able to propel themselves in the home using a standard weight wheelchair due to general weakness. Patient can self propel in the lightweight wheelchair.  Accessories: elevating leg rests (ELRs), wheel locks,  extensions and anti-tippers.   05/03/17 1431   05/03/17 1232  For home use only DME Other see comment  Once    Comments:  Need Michiel Sites lift   05/03/17 1232      Diagnostic Studies: Ct Head Wo Contrast  Result Date: 05/03/2017 CLINICAL DATA:  Altered mental status today. EXAM: CT HEAD WITHOUT CONTRAST TECHNIQUE: Contiguous axial images were obtained from the base of the skull through the vertex without intravenous contrast. COMPARISON:  Head CT scan 01/02/2017. FINDINGS: Brain: Atrophy and chronic microvascular ischemic change are again seen. No evidence of acute abnormality including hemorrhage, infarct, mass lesion, mass effect, midline shift or abnormal extra-axial fluid collection. No hydrocephalus or pneumocephalus. Vascular: Atherosclerosis noted. Skull: Intact. Sinuses/Orbits: Negative. Other: None. IMPRESSION: No acute finding. Atrophy and chronic microvascular ischemic change. Atherosclerosis. Electronically Signed   By: Drusilla Kanner M.D.   On: 05/03/2017 16:01   Dg Chest Port 1 View  Result Date: 05/03/2017 CLINICAL DATA:  Shortness of breath.  Leukocytosis. EXAM: PORTABLE CHEST 1 VIEW COMPARISON:  03/12/2017 FINDINGS: A defibrillator lead again projects over the left chest. Sequelae of prior CABG are again identified. The cardiac silhouette remains mildly enlarged. Aortic atherosclerosis is noted. Pulmonary vascular congestion on the prior study has mildly decreased. No overt pulmonary edema, lobar consolidation, sizeable pleural effusion, pneumothorax is identified. No acute osseous abnormality is seen. IMPRESSION: Cardiomegaly with decreased pulmonary vascular congestion. Electronically Signed   By: Sebastian Ache M.D.   On: 05/03/2017 16:34    Patient benefited maximally from their hospital stay and there were no complications.     Disposition: 06-Home-Health Care Svc Discharge Instructions    Ambulatory referral to Physical Medicine Rehab   Complete by:  As directed    1 month  follow up for left AKA, right BKA post-discharge   Call MD / Call 911   Complete by:  As directed    If you experience chest pain or shortness of breath, CALL 911 and be transported to the hospital emergency room.  If you develope a fever above 101 F, pus (white drainage) or increased drainage or redness at the wound, or calf pain, call your surgeon's office.   Constipation Prevention   Complete by:  As directed    Drink plenty of fluids.  Prune juice may be helpful.  You may use a stool  softener, such as Colace (over the counter) 100 mg twice a day.  Use MiraLax (over the counter) for constipation as needed.   Diet - low sodium heart healthy   Complete by:  As directed    Increase activity slowly as tolerated   Complete by:  As directed      Follow-up Information    Nadara Mustard, MD Follow up in 1 week(s).   Specialty:  Orthopedic Surgery Contact information: [address] [place] [address] [phone]        [facility] Follow up.   Why:  A representative from [facility] will contact you to arrange start date and time for your therapy. Contact information: [phone]           Signed: Nadara Mustard 05/04/2017, 7:06 AM

## 2017-05-05 LAB — URINE CULTURE

## 2017-05-05 NOTE — Telephone Encounter (Signed)
Faxed for motorized chair to [phone] [facility]. Sent with HPI from hospital, face to face, rx and demographics.

## 2017-05-05 NOTE — Telephone Encounter (Signed)
Faxed for motorized chair to [phone] [facility]. Sent with HPI from hospital, face to face, rx and demographics.  

## 2017-05-08 ENCOUNTER — Ambulatory Visit: Payer: Medicare Other | Admitting: "Endocrinology

## 2017-05-08 LAB — CULTURE, BLOOD (ROUTINE X 2)
CULTURE: NO GROWTH
CULTURE: NO GROWTH

## 2017-05-15 ENCOUNTER — Telehealth (INDEPENDENT_AMBULATORY_CARE_PROVIDER_SITE_OTHER): Payer: Self-pay | Admitting: Orthopedic Surgery

## 2017-05-15 NOTE — Telephone Encounter (Signed)
Patient's wife called stating that she is unable to get transportation from [place] to [place].  She wanting to know if [name] could refer him to a doctor in [place].  CB#[phone].  Please advise.

## 2017-05-16 ENCOUNTER — Ambulatory Visit (INDEPENDENT_AMBULATORY_CARE_PROVIDER_SITE_OTHER): Payer: Medicare Other | Admitting: Orthopedic Surgery

## 2017-05-16 ENCOUNTER — Encounter (INDEPENDENT_AMBULATORY_CARE_PROVIDER_SITE_OTHER): Payer: Self-pay | Admitting: Orthopedic Surgery

## 2017-05-16 DIAGNOSIS — Z89611 Acquired absence of right leg above knee: Secondary | ICD-10-CM

## 2017-05-16 DIAGNOSIS — Z89512 Acquired absence of left leg below knee: Secondary | ICD-10-CM

## 2017-05-16 NOTE — Progress Notes (Signed)
Office Visit Note   Patient: [name]           Date of Birth: November 30, 1940           MRN: [id number] Visit Date: 05/16/2017              Requested by: Dorice [name], MD [address] [place], [place] [address] PCP: Dorice [name], MD  Chief Complaint  Patient presents with  . Right Leg - Routine Post Op      HPI: Patient is a 77 year old gentleman status post left below-knee amputation and 3 weeks status post right above-the-knee amputation.  Patient states that he is doing better.  They are concerned with the drive from [place] and would like to have a orthotist closer to home.  Assessment & Plan: Visit Diagnoses:  1. History of left below knee amputation (HCC)   2. History of right above knee amputation [facility])     Plan: Patient was given a prescription for XL orthotics and prosthetics in [place].  For a stump shrinker for the right above-the-knee amputation and stump shrinker for the left below-knee amputation.  Sutures harvested today.  Begin Dial soap cleansing.  Follow-Up Instructions: Return in about 4 weeks (around 06/13/2017).   Ortho Exam  Patient is alert, oriented, no adenopathy, well-dressed, normal affect, normal respiratory effort. Examination patient's left below-the-knee amputation is stable.  Right above-knee amputation is well approximated there is no wound dehiscence no cellulitis no signs of infection we will harvest the sutures today.  Patient will use the Ace wrap until he gets the stump shrinker for the right above-the-knee amputation.  Imaging: No results found. No images are attached to the encounter.  Labs: Lab Results  Component Value Date   HGBA1C 8.8 (H) 04/28/2017   HGBA1C 8.8 (H) 12/24/2016   HGBA1C 11.0 (H) 11/26/2016   ESRSEDRATE 123 (H) 11/26/2016   CRP 19.3 (H) 11/26/2016   LABURIC 9.0 (H) 09/21/2015   REPTSTATUS 05/05/2017 FINAL 05/03/2017   GRAMSTAIN  02/06/2017    ABUNDANT WBC  PRESENT, PREDOMINANTLY PMN FEW GRAM NEGATIVE RODS    CULT MULTIPLE SPECIES PRESENT, SUGGEST RECOLLECTION (A) 05/03/2017   LABORGA ESCHERICHIA COLI 02/06/2017    @LABSALLVALUES (HGBA1)@  There is no height or weight on file to calculate BMI.  Orders:  No orders of the defined types were placed in this encounter.  No orders of the defined types were placed in this encounter.    Procedures: No procedures performed  Clinical Data: No additional findings.  ROS:  All other systems negative, except as noted in the HPI. Review of Systems  Objective: Vital Signs: There were no vitals taken for this visit.  Specialty Comments:  No specialty comments available.  PMFS History: Patient Active Problem List   Diagnosis Date Noted  . History of right above knee amputation (HCC) 05/16/2017  . Acute delirium 05/03/2017  . History of peritonitis 05/03/2017  . Peritoneal dialysis status (HCC) 05/03/2017  . Insulin dependent type 2 diabetes mellitus, uncontrolled (HCC) 05/03/2017  . ESRD on peritoneal dialysis (HCC) 05/03/2017  . Peripheral vascular disease (HCC) 05/03/2017  . S/P above knee amputation, right (HCC) 05/03/2017  . Hypertension 05/03/2017  . COPD (chronic obstructive pulmonary disease) (HCC) 05/03/2017  . H/O atrial tachycardia 05/03/2017  . 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 Coronary artery disease 05/03/2017  . OSA on CPAP 05/03/2017  . FTT (failure to thrive) in adult 05/03/2017  .  Diabetes mellitus type 2 in nonobese (HCC)   . Post-operative pain   . Above knee amputation of right lower extremity (HCC) 04/28/2017  . Dehiscence of amputation stump (HCC)   . Atherosclerosis of native arteries of extremities with gangrene, right leg (HCC)   . Chronic systolic HF (heart failure) (HCC)   . Atrial fibrillation with RVR (HCC) 03/28/2017  . Atrial tachycardia (HCC)   . DCM (dilated cardiomyopathy) (HCC)   .  Tachycardia   . Type 2 diabetes mellitus with peripheral neuropathy (HCC)   . New onset atrial fibrillation (HCC)   . Confusion 03/12/2017  . Loose stools   . Essential hypertension   . Acute combined systolic and diastolic congestive heart failure (HCC)   . Poorly controlled type 2 diabetes mellitus with peripheral neuropathy (HCC)   . S/P bilateral below knee amputation (HCC) 03/06/2017  . Constipation due to pain medication   . Coronary artery disease involving native coronary artery of native heart without angina pectoris   . Hyperlipidemia   . SIRS (systemic inflammatory response syndrome) (HCC) 03/05/2017  . Blood in stool   . Wound dehiscence   . Rupture of operation wound   . Pressure injury of head, stage 3 (HCC)   . Poor nutrition   . Brittle diabetes (HCC)   . Adjustment disorder with depressed mood   . Hypoglycemia   . Ischemic pain of right foot   . Arterial hypotension   . Diabetes mellitus type 2 in obese (HCC)   . PVD (peripheral vascular disease) (HCC)   . Delirium   . History of left below knee amputation (HCC) 01/17/2017  . Acute blood loss anemia   . Elevated AST (SGOT) 01/09/2017  . Chest pain 01/09/2017  . NSTEMI (non-ST elevated myocardial infarction) (HCC) 01/09/2017  . Hypokalemia 01/07/2017  . Hypomagnesemia 01/07/2017  . Chronic hypotension 01/06/2017  . 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 IDDM (insulin dependent diabetes mellitus) (HCC) 01/06/2017  . Labile blood pressure   . Hallucination   . Benign essential HTN   . Ulcer of toe of right foot (HCC)   . Ulcer of external ear, limited to breakdown of skin (HCC)   . Chronic congestive heart failure (HCC)   . Anemia of chronic disease   . Labile blood glucose   . 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 Diabetic peripheral neuropathy (HCC)   . Debility 12/27/2016  . Constipation   . Chronic obstructive pulmonary disease  (HCC)   . Nausea   . ESRD on dialysis (HCC)   . Leukocytosis   . Tachypnea   . FUO (fever of unknown origin)   . Encephalopathy 12/23/2016  . Sore throat 12/23/2016  . PAD (peripheral artery disease) (HCC) 11/26/2016  . Sjogren's syndrome (HCC) 09/26/2015  . Uncontrolled type 2 diabetes mellitus with hyperglycemia, with long-term current use of insulin (HCC) 09/26/2015  . Hyperosmolar non-ketotic state in patient with type 2 diabetes mellitus (HCC) 09/25/2015  . Ileus (HCC) 09/22/2015  . Acute respiratory failure with hypoxia (HCC) 09/22/2015  . Acute gouty arthritis 09/22/2015  . CAD (coronary artery disease) of artery bypass graft 09/22/2015  . Sepsis (HCC) 09/21/2015  . Elevated troponin level 09/21/2015  . ESRD (end stage renal disease) (HCC) 09/21/2015  . CVA (cerebral infarction) 09/21/2015   Past Medical History:  Diagnosis Date  . AICD (automatic cardioverter/defibrillator) present    [employer] [id number] lead [id number]  .  Anemia   . Arthritis   . CHF (congestive heart failure) (HCC)   . Complication of anesthesia   . COPD (chronic obstructive pulmonary disease) (HCC)   . Coronary artery disease    CABG 2011  . Diabetes mellitus without complication (HCC)   . Dyspnea   . Dysrhythmia   . ESRD on peritoneal dialysis [facility](HCC)    [place] in [place]. Cy[place]er- at night  . Foot infection 01/06/2017  . Gangrene (HCC)    left knee  . Gangrene of left foot (HCC) 11/26/2016  . Gangrene of right foot (HCC) 02/23/2017  . GERD (gastroesophageal reflux disease)   . Hemorrhoids   . History of peritonitis    PD cath related peritonitis in April 2017  . History of transmetatarsal amputation of left foot (HCC) 12/27/2016  . Hypertension   . Occult blood in stools 04/2017  . Peripheral vascular disease (HCC)   . PONV (postoperative nausea and vomiting)   . Sjogren's syndrome (HCC)    Per pt's wife, was diagnosed in 2016 during  w/u for cause of renal failure prior to starting dialysis.  He had a rash on his chest apparently prompting this w/u.  Never had renal bx.  He was [place]um [place] in [place] and per the wife he was treated with MTX and other medications but had side effects to "all of it" and isn't taking anything for this as of Jun 2017.   [name]. Sleep apnea    wears Bipap  . Stroke (HCC) 2016  . Wound dehiscence    Right foot    Family History  Problem Relation Age of Onset  . Heart disease Father   . Diabetes Mother     Past Surgical History:  Procedure Laterality Date  . ABDOMINAL AORTOGRAM W/LOWER EXTREMITY N/A 11/29/2016   Procedure: ABDOMINAL AORTOGRAM W/LOWER EXTREMITY;  Surgeon: Nada LibmanBrabham, Vance W, MD;  [facility];  Service: Cardiovascular;  Laterality: N/A;  . AMPUTATION Left 11/29/2016   Procedure: LEFT THIRD TOE AMPUTATION;  Surgeon: Nada LibmanBrabham, Vance W, MD;  [facility];  Service: Vascular;  Laterality: Left;  . AMPUTATION Left 12/23/2016   Procedure: Left Transmetatarsal Amputation;  Surgeon: [name], Marcus V, MD;  [facility];  Service: Orthopedics;  Laterality: Left;  . AMPUTATION Left 01/11/2017   Procedure: LEFT BELOW KNEE AMPUTATION;  Surgeon: [name], Marcus V, MD;  [facility];  Service: Orthopedics;  Laterality: Left;  . AMPUTATION Right 01/20/2017   Procedure: AMPUTATION RIGHT FIFTH TOE;  Surgeon: Nada LibmanBrabham, Vance W, MD;  [facility];  Service: Vascular;  Laterality: Right;  . AMPUTATION Right 03/01/2017   Procedure: RIGHT BELOW KNEE AMPUTATION;  Surgeon: [name], Marcus V, MD;  [facility];  Service: Orthopedics;  Laterality: Right;  . AMPUTATION Right 04/28/2017   Procedure: RIGHT ABOVE KNEE AMPUTATION;  Surgeon: [name], Marcus V, MD;  [facility];  Service: Orthopedics;  Laterality: Right;  . ANGIOPLASTY  12/15/2016   Procedure: BALLOON ANGIOPLASTY;  Surgeon: Maeola Harmanain, Brandon Christopher, MD;  [facility];  Service: Vascular;;  . cardiac catherization with  stent placement  2017  . COLONOSCOPY W/ BIOPSIES AND POLYPECTOMY    . CORONARY ARTERY BYPASS GRAFT  2011  . FALSE ANEURYSM REPAIR Right 12/15/2016   Procedure: REPAIR FALSE ANEURYSM;  Surgeon: Maeola Harmanain, Brandon Christopher, MD;  [facility];  Service: Vascular;  Laterality: Right;  . IRRIGATION AND DEBRIDEMENT FOOT Left 12/15/2016  Procedure: IRRIGATION AND DEBRIDEMENT FOOT;  Surgeon: Maeola Harman, MD;  Location[facility];  Service: Vascular;  Laterality: Left;  . LEFT HEART CATH AND CORS/GRAFTS ANGIOGRAPHY N/A 01/09/2017   Procedure: LEFT HEART CATH AND CORS/GRAFTS ANGIOGRAPHY;  Surgeon: Runell Gess, MD;  [facility];  Service: Cardiovascular;  Laterality: N/A;  . LOWER EXTREMITY ANGIOGRAM Left 12/15/2016   Procedure: LOWER EXTREMITY ANGIOGRAM; PEDAL ACCESS;  Surgeon: Maeola Harman, MD;  [facility];  Service: Vascular;  Laterality: Left;  . PERIPHERAL VASCULAR BALLOON ANGIOPLASTY  11/29/2016   Procedure: PERIPHERAL VASCULAR BALLOON ANGIOPLASTY;  Surgeon: Nada Libman, MD;  [facility];  Service: Cardiovascular;;  LT Peroneal AT attempted unsuccessful  . SIGMOIDECTOMY    . STUMP REVISION Left 03/01/2017   Procedure: REVISION LEFT BELOW KNEE AMPUTATION;  Surgeon: Nadara Mustard, MD;  Location: [facility] OR;  Service: Orthopedics;  Laterality: Left;   Social History   Occupational History  . Not on file  Tobacco Use  . Smoking status: Former Smoker    Types: Pipe  . Smokeless tobacco: Never Used  . Tobacco comment: 30 years ago 02/28/17  Substance and Sexual Activity  . Alcohol use: No  . Drug use: No  . Sexual activity: No

## 2017-05-16 NOTE — Telephone Encounter (Signed)
Addressed while pt in office today

## 2017-05-19 ENCOUNTER — Telehealth (INDEPENDENT_AMBULATORY_CARE_PROVIDER_SITE_OTHER): Payer: Self-pay

## 2017-05-19 NOTE — Telephone Encounter (Signed)
Can you please open slot ans sch pt for an appt on Tuesday at 1:30? SNF called with concerns of wound dehiscence

## 2017-05-19 NOTE — Telephone Encounter (Signed)
I added to schedule.

## 2017-05-23 ENCOUNTER — Encounter (INDEPENDENT_AMBULATORY_CARE_PROVIDER_SITE_OTHER): Payer: Self-pay | Admitting: Orthopedic Surgery

## 2017-05-23 ENCOUNTER — Ambulatory Visit (INDEPENDENT_AMBULATORY_CARE_PROVIDER_SITE_OTHER): Payer: Medicare Other | Admitting: Orthopedic Surgery

## 2017-05-23 VITALS — Ht 69.0 in | Wt 156.0 lb

## 2017-05-23 DIAGNOSIS — Z89611 Acquired absence of right leg above knee: Secondary | ICD-10-CM

## 2017-05-23 DIAGNOSIS — Z89512 Acquired absence of left leg below knee: Secondary | ICD-10-CM

## 2017-05-23 NOTE — Progress Notes (Signed)
Office Visit Note   Patient: [name]           Date of Birth: 1940/06/11           MRN: [id number] Visit Date: 05/23/2017              Requested by: Dorice [name], MD [address] [place], [place] [address] PCP: Dorice [name], MD  Chief Complaint  Patient presents with  . Right Leg - Wound Check    04/28/17 right above the knee amputation       HPI: Patient is a 77 year old gentleman who presents 4 weeks status post right above-knee amputation.  He is approximately 3 months status post left transtibial amputation.  Assessment & Plan: Visit Diagnoses:  1. History of left below knee amputation (HCC)   2. History of right above knee amputation (HCC)     Plan: We will continue with Dial soap cleansing to the right above-the-knee amputation dry dressing changes daily.  Follow-Up Instructions: Return in about 2 weeks (around 06/06/2017).   Ortho Exam  Patient is alert, oriented, no adenopathy, well-dressed, normal affect, normal respiratory effort. Examination patient ambulates in a motorized wheelchair.  The left transtibial amputation is well-healed there is no redness no cellulitis no wound dehiscence.  Examination of the right above-the-knee amputation patient has 2 wounds which have slightly dehisced.  The midline wound is 2 cm in length 5 mm in diameter and 3 mm deep after debridement there is good healthy granulation tissue at the base of the wound.  No exposed bone.  Examination of the most medial wound this is approximately 5 mm in diameter 2 cm long and about 2 mm deep this has approximately 75% healthy granulation tissue 25% fibrinous tissue.  There is no cellulitis no odor no drainage.  Imaging: No results found. No images are attached to the encounter.  Labs: Lab Results  Component Value Date   HGBA1C 8.8 (H) 04/28/2017   HGBA1C 8.8 (H) 12/24/2016   HGBA1C 11.0 (H) 11/26/2016   ESRSEDRATE 123 (H) 11/26/2016   CRP 19.3 (H)  11/26/2016   LABURIC 9.0 (H) 09/21/2015   REPTSTATUS 05/05/2017 FINAL 05/03/2017   GRAMSTAIN  02/06/2017    ABUNDANT WBC PRESENT, PREDOMINANTLY PMN FEW GRAM NEGATIVE RODS    CULT MULTIPLE SPECIES PRESENT, SUGGEST RECOLLECTION (A) 05/03/2017   LABORGA ESCHERICHIA COLI 02/06/2017    @LABSALLVALUES (HGBA1)@  Body mass index is 23.04 kg/m.  Orders:  No orders of the defined types were placed in this encounter.  No orders of the defined types were placed in this encounter.    Procedures: No procedures performed  Clinical Data: No additional findings.  ROS:  All other systems negative, except as noted in the HPI. Review of Systems  Objective: Vital Signs: Ht 5\' 9"  (1.753 m)   Wt 156 lb (70.8 kg)   BMI 23.04 kg/m   Specialty Comments:  No specialty comments available.  PMFS History: Patient Active Problem List   Diagnosis Date Noted  . History of right above knee amputation (HCC) 05/16/2017  . Acute delirium 05/03/2017  . History of peritonitis 05/03/2017  . Peritoneal dialysis status (HCC) 05/03/2017  . Insulin dependent type 2 diabetes mellitus, uncontrolled (HCC) 05/03/2017  . ESRD on peritoneal dialysis (HCC) 05/03/2017  . Peripheral vascular disease (HCC) 05/03/2017  . S/P above knee amputation, right (HCC) 05/03/2017  . Hypertension 05/03/2017  . COPD (chronic obstructive pulmonary disease) (HCC) 05/03/2017  . H/O atrial tachycardia 05/03/2017  .  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 Coronary artery disease 05/03/2017  . OSA on CPAP 05/03/2017  . FTT (failure to thrive) in adult 05/03/2017  . Diabetes mellitus type 2 in nonobese (HCC)   . Post-operative pain   . Above knee amputation of right lower extremity (HCC) 04/28/2017  . Dehiscence of amputation stump (HCC)   . Atherosclerosis of native arteries of extremities with gangrene, right leg (HCC)   . Chronic systolic HF (heart failure) (HCC)   .  Atrial fibrillation with RVR (HCC) 03/28/2017  . Atrial tachycardia (HCC)   . DCM (dilated cardiomyopathy) (HCC)   . Tachycardia   . Type 2 diabetes mellitus with peripheral neuropathy (HCC)   . New onset atrial fibrillation (HCC)   . Confusion 03/12/2017  . Loose stools   . Essential hypertension   . Acute combined systolic and diastolic congestive heart failure (HCC)   . Poorly controlled type 2 diabetes mellitus with peripheral neuropathy (HCC)   . S/P bilateral below knee amputation (HCC) 03/06/2017  . Constipation due to pain medication   . Coronary artery disease involving native coronary artery of native heart without angina pectoris   . Hyperlipidemia   . SIRS (systemic inflammatory response syndrome) (HCC) 03/05/2017  . Blood in stool   . Wound dehiscence   . Rupture of operation wound   . Pressure injury of head, stage 3 (HCC)   . Poor nutrition   . Brittle diabetes (HCC)   . Adjustment disorder with depressed mood   . Hypoglycemia   . Ischemic pain of right foot   . Arterial hypotension   . Diabetes mellitus type 2 in obese (HCC)   . PVD (peripheral vascular disease) (HCC)   . Delirium   . History of left below knee amputation (HCC) 01/17/2017  . Acute blood loss anemia   . Elevated AST (SGOT) 01/09/2017  . Chest pain 01/09/2017  . NSTEMI (non-ST elevated myocardial infarction) (HCC) 01/09/2017  . Hypokalemia 01/07/2017  . Hypomagnesemia 01/07/2017  . Chronic hypotension 01/06/2017  . 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 IDDM (insulin dependent diabetes mellitus) (HCC) 01/06/2017  . Labile blood pressure   . Hallucination   . Benign essential HTN   . Ulcer of toe of right foot (HCC)   . Ulcer of external ear, limited to breakdown of skin (HCC)   . Chronic congestive heart failure (HCC)   . Anemia of chronic disease   . Labile blood glucose   . 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  Diabetic peripheral neuropathy (HCC)   . Debility 12/27/2016  . Constipation   . Chronic obstructive pulmonary disease (HCC)   . Nausea   . ESRD on dialysis (HCC)   . Leukocytosis   . Tachypnea   . FUO (fever of unknown origin)   . Encephalopathy 12/23/2016  . Sore throat 12/23/2016  . PAD (peripheral artery disease) (HCC) 11/26/2016  . Sjogren's syndrome (HCC) 09/26/2015  . Uncontrolled type 2 diabetes mellitus with hyperglycemia, with long-term current use of insulin (HCC) 09/26/2015  . Hyperosmolar non-ketotic state in patient with type 2 diabetes mellitus (HCC) 09/25/2015  . Ileus (HCC) 09/22/2015  . Acute respiratory failure with hypoxia (HCC) 09/22/2015  . Acute gouty arthritis 09/22/2015  . CAD (coronary artery disease) of artery bypass graft 09/22/2015  . Sepsis (HCC) 09/21/2015  . Elevated troponin level 09/21/2015  . ESRD (end stage renal disease) (HCC) 09/21/2015  .  CVA (cerebral infarction) 09/21/2015   Past Medical History:  Diagnosis Date  . AICD (automatic cardioverter/defibrillator) present    [employer] [phone] lead [id number]  . Anemia   . Arthritis   . CHF (congestive heart failure) (HCC)   . Complication of anesthesia   . COPD (chronic obstructive pulmonary disease) (HCC)   . Coronary artery disease    CABG 2011  . Diabetes mellitus without complication (HCC)   . Dyspnea   . Dysrhythmia   . ESRD on peritoneal dialysis [facility])    Started PD Dec 2016 in [place]. Nephrology is in [place]. CyCler- at night  . Foot infection 01/06/2017  . Gangrene (HCC)    left knee  . Gangrene of left foot (HCC) 11/26/2016  . Gangrene of right foot (HCC) 02/23/2017  . GERD (gastroesophageal reflux disease)   . Hemorrhoids   . History of peritonitis    PD cath related peritonitis in April 2017  . History of transmetatarsal amputation of left foot (HCC) 12/27/2016  . Hypertension   . Occult blood in stools 04/2017  . Peripheral vascular disease (HCC)   .  PONV (postoperative nausea and vomiting)   . Sjogren's syndrome (HCC)    Per pt's wife, was diagnosed in 2016 during w/u for cause of renal failure prior to starting dialysis.  He had a rash on his chest apparently prompting this w/u.  Never had renal bx.  He was referred to a rheum MD in [place] and per the wife he was treated with MTX and other medications but had side effects to "all of it" and isn't taking anything for this as of Jun 2017.   [name] Sleep apnea    wears Bipap  . Stroke (HCC) 2016  . Wound dehiscence    Right foot    Family History  Problem Relation Age of Onset  . Heart disease Father   . Diabetes Mother     Past Surgical History:  Procedure Laterality Date  . ABDOMINAL AORTOGRAM W/LOWER EXTREMITY N/A 11/29/2016   Procedure: ABDOMINAL AORTOGRAM W/LOWER EXTREMITY;  Surgeon: Nada Libman, MD;  [facility];  Service: Cardiovascular;  Laterality: N/A;  . AMPUTATION Left 11/29/2016   Procedure: LEFT THIRD TOE AMPUTATION;  Surgeon: Nada Libman, MD;  Location: [facility] OR;  Service: Vascular;  Laterality: Left;  . AMPUTATION Left 12/23/2016   Procedure: Left Transmetatarsal Amputation;  Surgeon: Nadara Mustard, MD;  Location: [facility] OR;  Service: Orthopedics;  Laterality: Left;  . AMPUTATION Left 01/11/2017   Procedure: LEFT BELOW KNEE AMPUTATION;  Surgeon: Nadara Mustard, MD;  Location: [facility] OR;  Service: Orthopedics;  Laterality: Left;  . AMPUTATION Right 01/20/2017   Procedure: AMPUTATION RIGHT FIFTH TOE;  Surgeon: Nada Libman, MD;  Location: [facility] OR;  Service: Vascular;  Laterality: Right;  . AMPUTATION Right 03/01/2017   Procedure: RIGHT BELOW KNEE AMPUTATION;  Surgeon: Nadara Mustard, MD;  Location: [facility] OR;  Service: Orthopedics;  Laterality: Right;  . AMPUTATION Right 04/28/2017   Procedure: RIGHT ABOVE KNEE AMPUTATION;  Surgeon: Nadara Mustard, MD;  Location: [facility] OR;  Service: Orthopedics;  Laterality: Right;  . ANGIOPLASTY  12/15/2016   Procedure: BALLOON  ANGIOPLASTY;  Surgeon: Maeola Harman, MD;  Location: [facility] OR;  Service: Vascular;;  . cardiac catherization with stent placement  2017  . COLONOSCOPY W/ BIOPSIES AND POLYPECTOMY    . CORONARY ARTERY BYPASS GRAFT  2011  . FALSE ANEURYSM REPAIR Right 12/15/2016  Procedure: REPAIR FALSE ANEURYSM;  Surgeon: Maeola Harman, MD;  Location: [facility] OR;  Service: Vascular;  Laterality: Right;  . IRRIGATION AND DEBRIDEMENT FOOT Left 12/15/2016   Procedure: IRRIGATION AND DEBRIDEMENT FOOT;  Surgeon: Maeola Harman, MD;  Location: [facility] OR;  Service: Vascular;  Laterality: Left;  . LEFT HEART CATH AND CORS/GRAFTS ANGIOGRAPHY N/A 01/09/2017   Procedure: LEFT HEART CATH AND CORS/GRAFTS ANGIOGRAPHY;  Surgeon: Runell Gess, MD;  Location: [facility];  Service: Cardiovascular;  Laterality: N/A;  . LOWER EXTREMITY ANGIOGRAM Left 12/15/2016   Procedure: LOWER EXTREMITY ANGIOGRAM; PEDAL ACCESS;  Surgeon: Maeola Harman, MD;  Location: [facility] OR;  Service: Vascular;  Laterality: Left;  . PERIPHERAL VASCULAR BALLOON ANGIOPLASTY  11/29/2016   Procedure: PERIPHERAL VASCULAR BALLOON ANGIOPLASTY;  Surgeon: Nada Libman, MD;  Location: [facility];  Service: Cardiovascular;;  LT Peroneal AT attempted unsuccessful  . SIGMOIDECTOMY    . STUMP REVISION Left 03/01/2017   Procedure: REVISION LEFT BELOW KNEE AMPUTATION;  Surgeon: Nadara Mustard, MD;  Location: [facility] OR;  Service: Orthopedics;  Laterality: Left;   Social History   Occupational History  . Not on file  Tobacco Use  . Smoking status: Former Smoker    Types: Pipe  . Smokeless tobacco: Never Used  . Tobacco comment: 30 years ago 02/28/17  Substance and Sexual Activity  . Alcohol use: No  . Drug use: No  . Sexual activity: No

## 2017-06-06 ENCOUNTER — Ambulatory Visit (INDEPENDENT_AMBULATORY_CARE_PROVIDER_SITE_OTHER): Payer: Medicare Other | Admitting: Orthopedic Surgery

## 2017-06-13 ENCOUNTER — Ambulatory Visit (INDEPENDENT_AMBULATORY_CARE_PROVIDER_SITE_OTHER): Payer: Medicare Other | Admitting: Orthopedic Surgery

## 2017-07-05 ENCOUNTER — Ambulatory Visit (INDEPENDENT_AMBULATORY_CARE_PROVIDER_SITE_OTHER): Payer: Medicare Other | Admitting: Orthopedic Surgery

## 2017-07-12 ENCOUNTER — Ambulatory Visit (INDEPENDENT_AMBULATORY_CARE_PROVIDER_SITE_OTHER): Payer: Medicare Other | Admitting: Orthopedic Surgery

## 2019-01-26 IMAGING — US US ABDOMEN LIMITED
1 series · 14 of 25 positions shown · non-contrast
Comparison: 12/19/2016

CLINICAL DATA: Elevated LFTs.

EXAM:
ULTRASOUND ABDOMEN LIMITED RIGHT UPPER QUADRANT

[Series 1: us abdomen limited · 0.30mm/px · 14 of 41 slices shown]
[im 1/41]
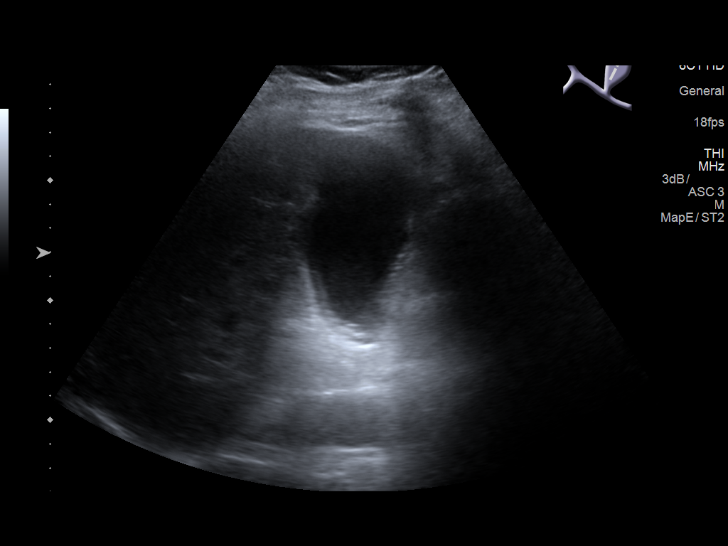
[im 4/41]
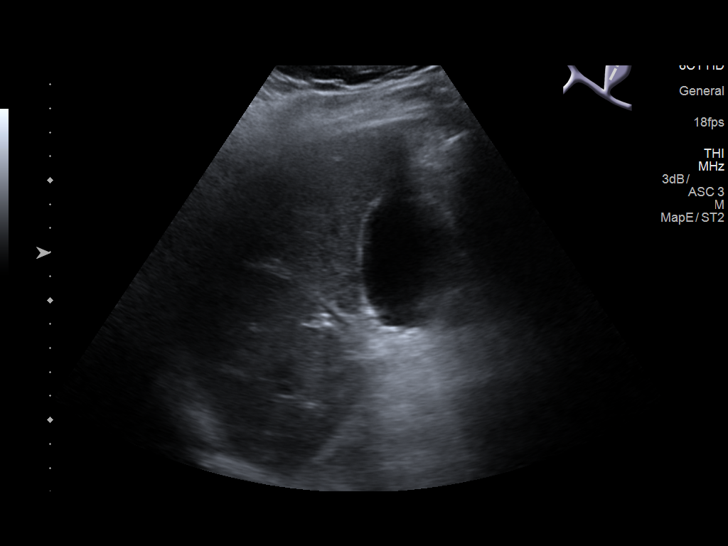
[im 7/41]
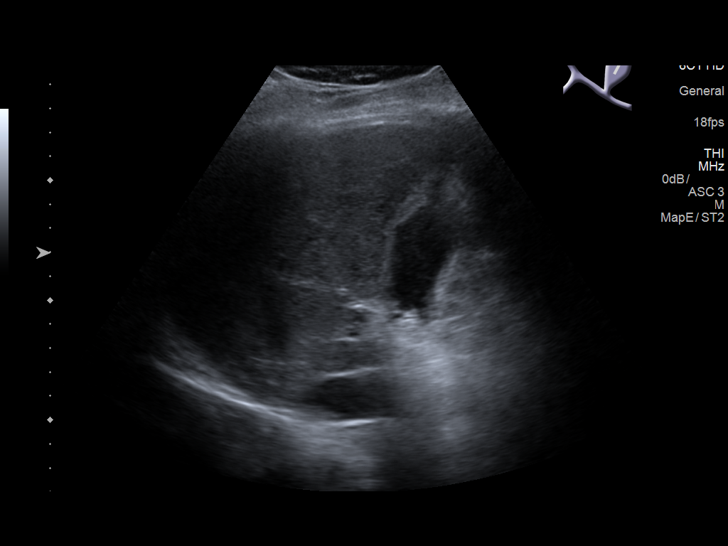
[im 11/41]
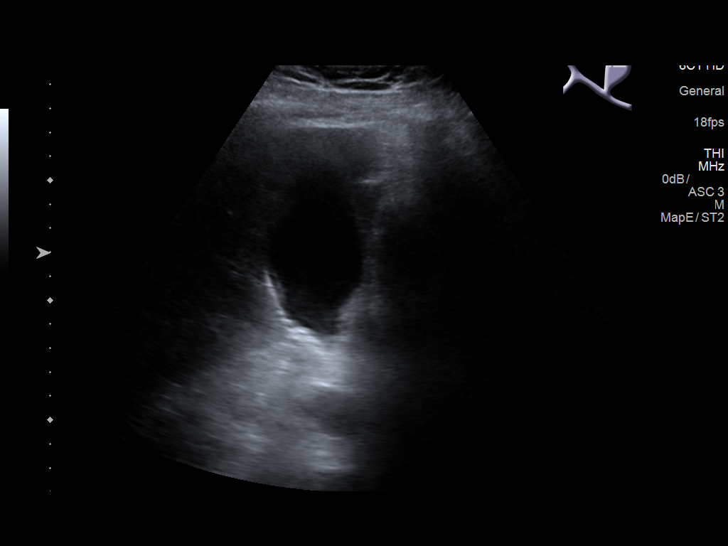
[im 14/41]
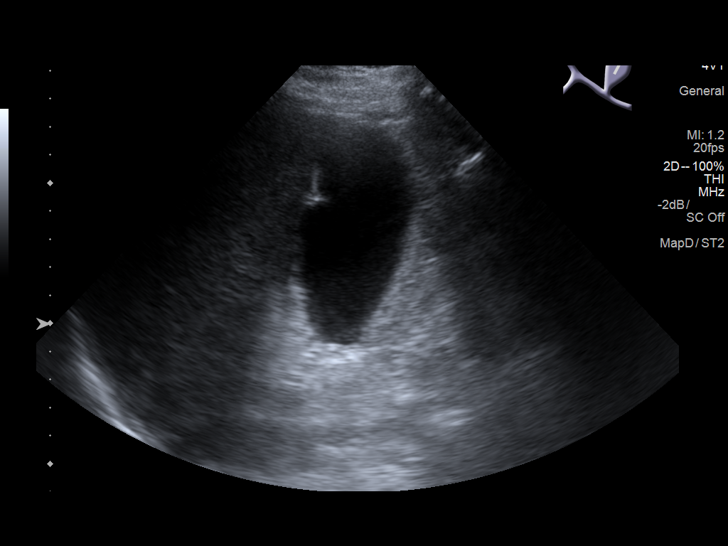
[im 16/41]
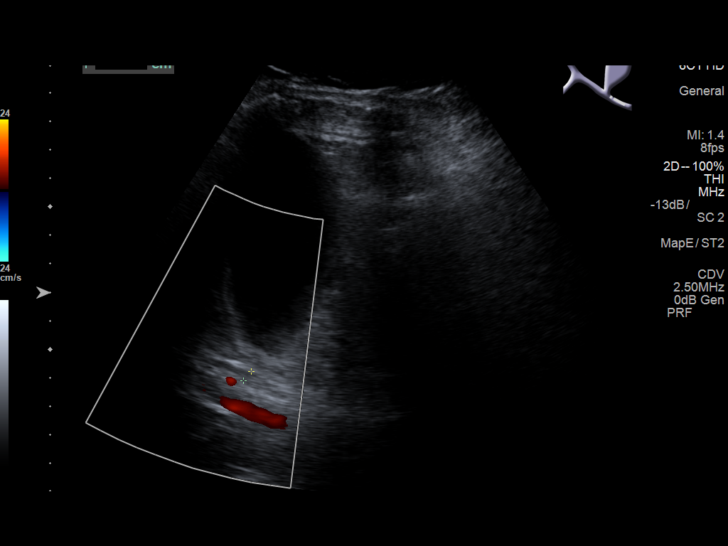
[im 19/41]
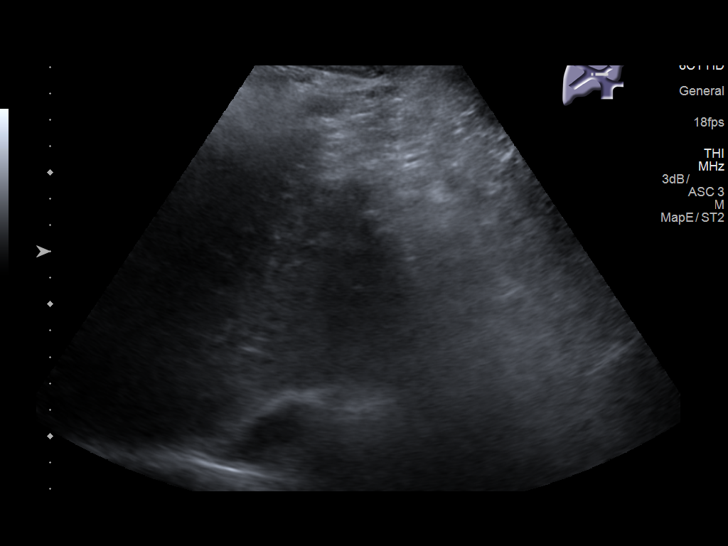
[im 22/41]
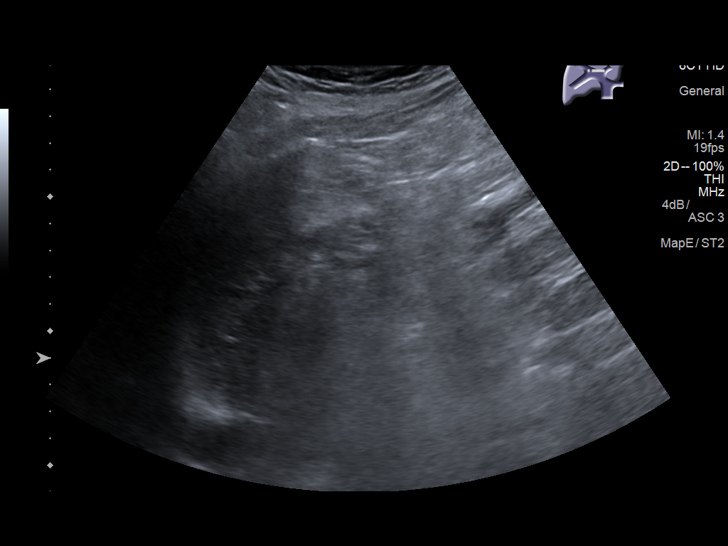
[im 26/41]
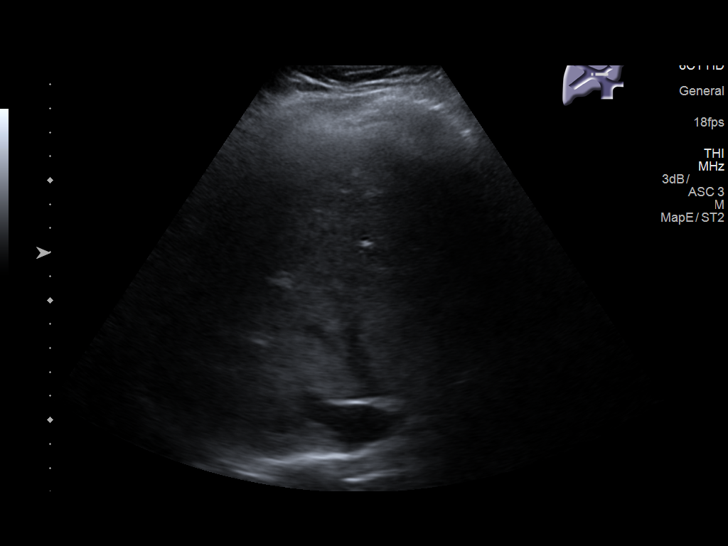
[im 27/41]
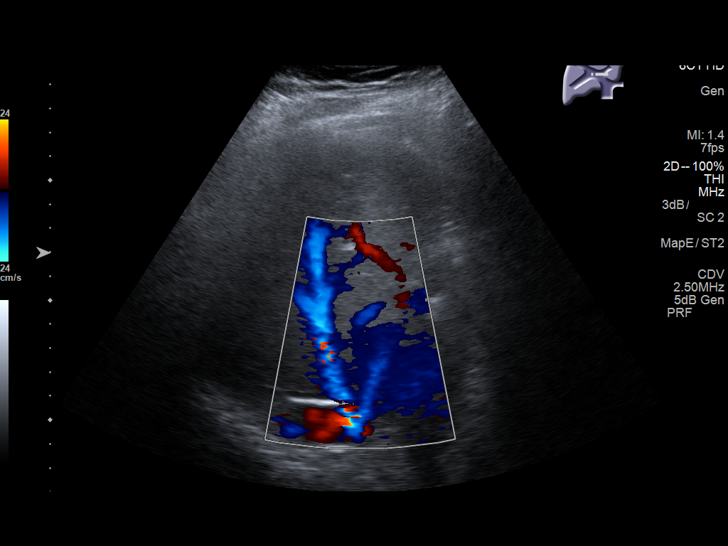
[im 31/41]
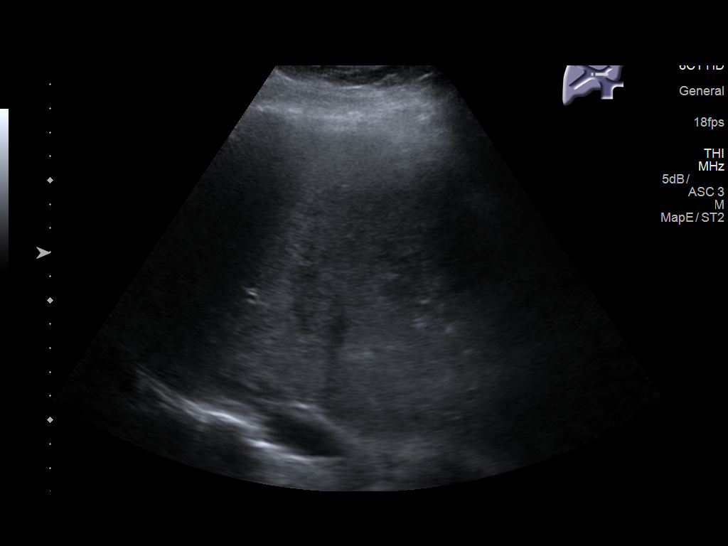
[im 34/41]
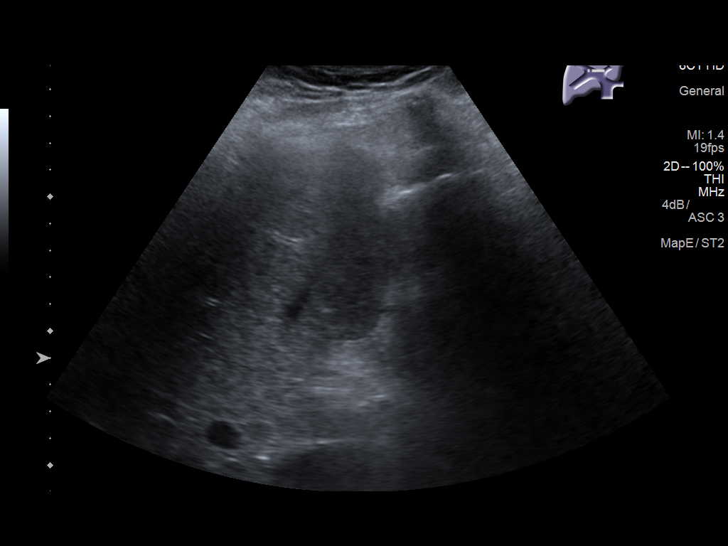
[im 37/41]
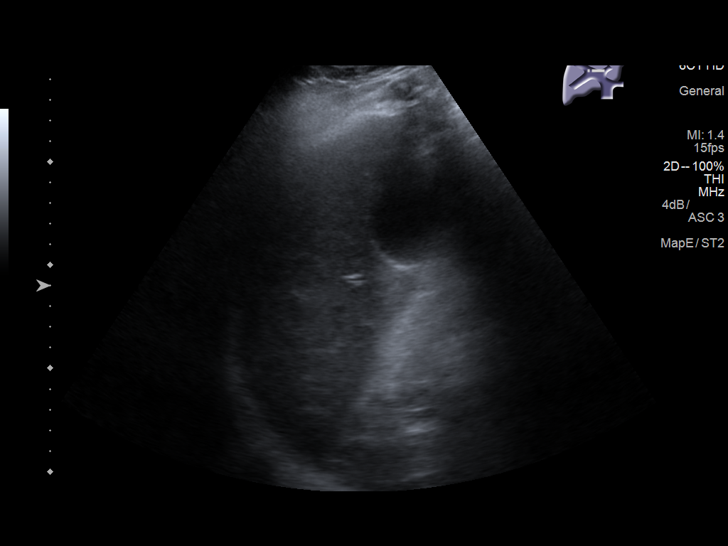
[im 41/41]
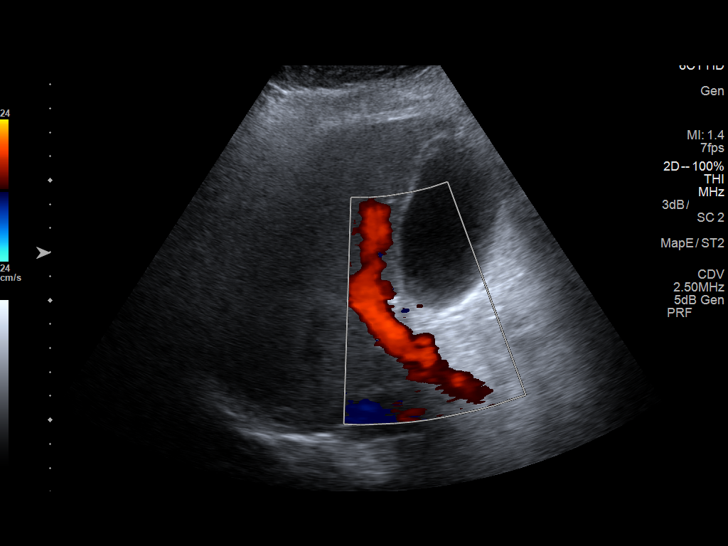

[14 of 25 positions shown; findings below may reference images not displayed]

FINDINGS: Gallbladder:

Evidence of sludge versus nonshadowing stones over the dependent
portion of the gallbladder/neck. Largest measures 7 mm. No wall
thickening or adjacent free fluid. Negative sonographic Murphy sign.

Common bile duct:

Diameter: 4.2 mm.

Liver:

No focal lesion identified. Within normal limits in parenchymal
echogenicity. Portal vein is patent on color Doppler imaging with
normal direction of blood flow towards the liver.
IMPRESSION: Findings suggesting mild sludge versus small nonshadowing stones
over the dependent portion of the gallbladder. No additional
sonographic evidence to suggest cholecystitis.

## 2019-06-10 DEATH — deceased

## 2019-09-19 IMAGING — CT CT HEAD W/O CM
3 of 6 series · 13 of 47 positions shown, 15 images · non-contrast
Comparison: None.

CLINICAL DATA: Ataxia, difficulty standing, hallucinations, altered
mental status.

EXAM:
CT HEAD WITHOUT CONTRAST
TECHNIQUE: Contiguous axial images were obtained from the base of the skull
through the vertex without intravenous contrast.

[Series 5: head 5.0 h30s · axial · 0.46mm/px · z∈[+1342,+1457]mm · 8 of 31 slices shown, 10 images]
[im 4/31  brain]
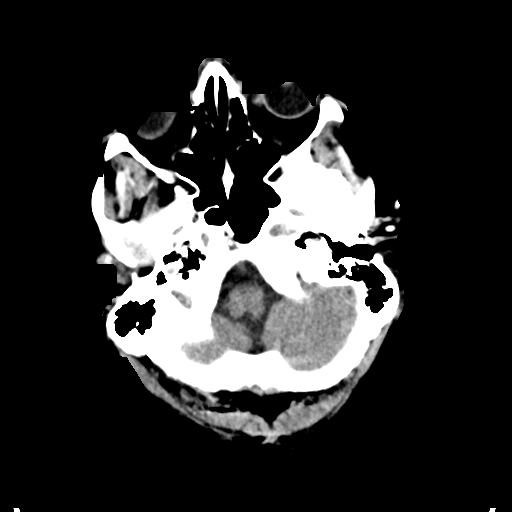
[im 4/31  bone]
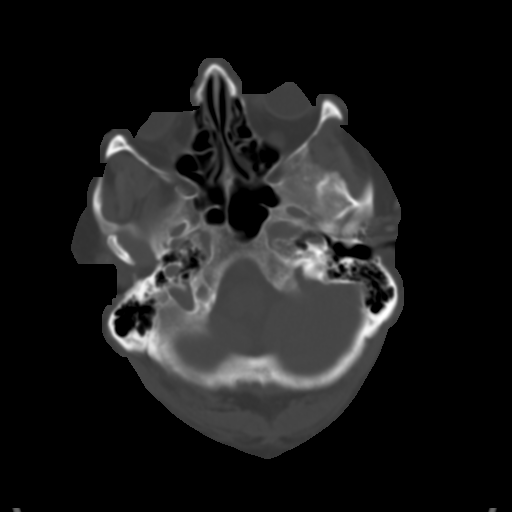
[im 7/31  brain]
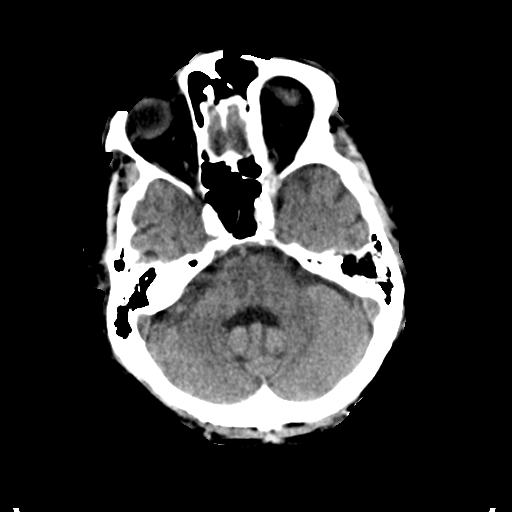
[im 10/31  brain]
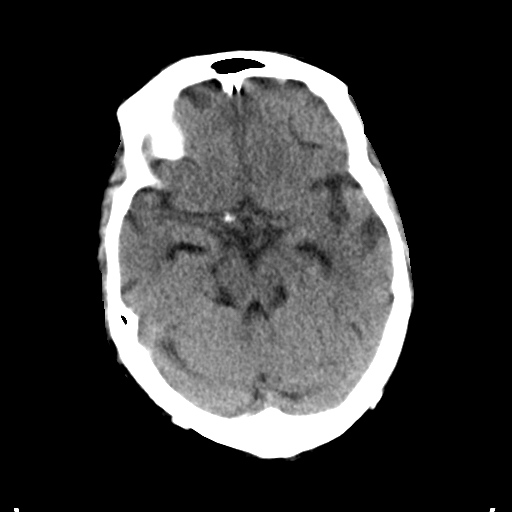
[im 13/31  brain]
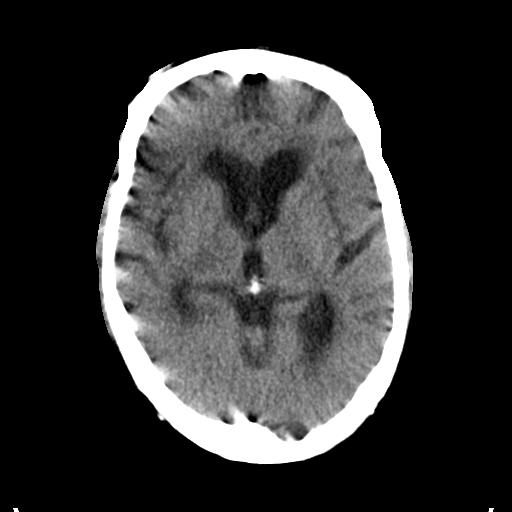
[im 18/31  brain]
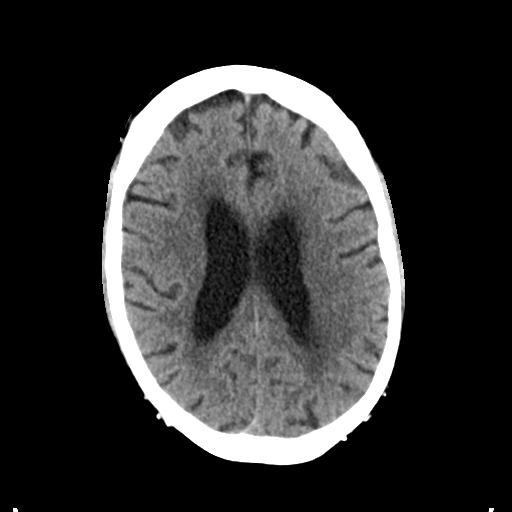
[im 18/31  bone]
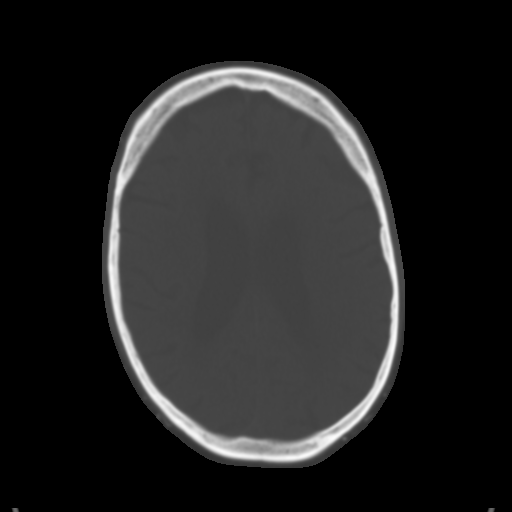
[im 21/31  brain]
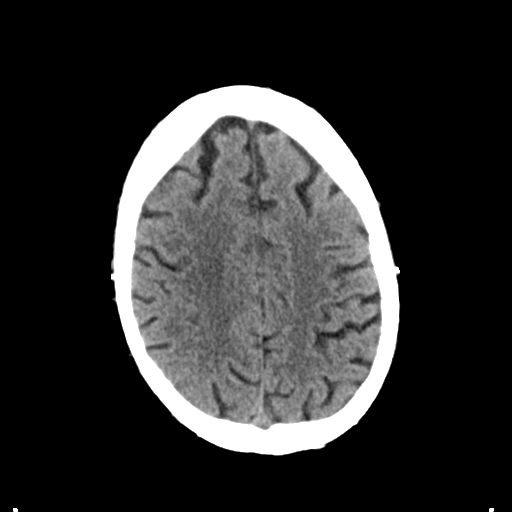
[im 24/31  brain]
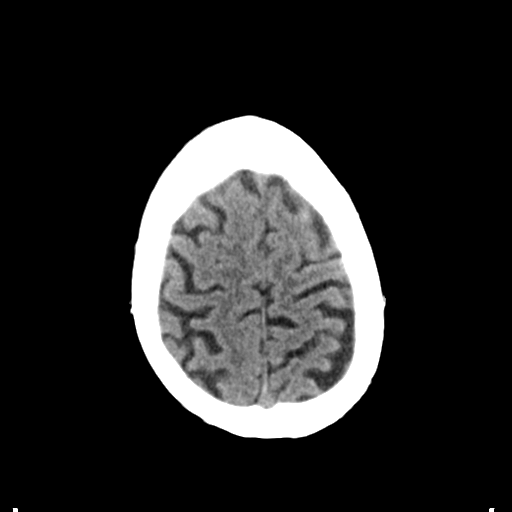
[im 27/31  brain]
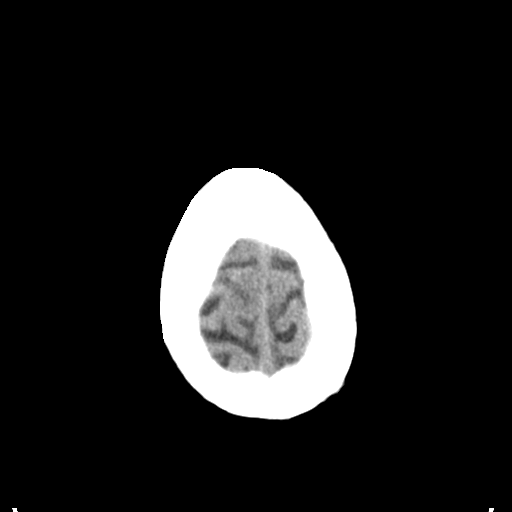

[Series 9: head 3.0 mpr cor · coronal · 0.17mm/px · 3 of 64 slices shown]
[im 16/64  brain]
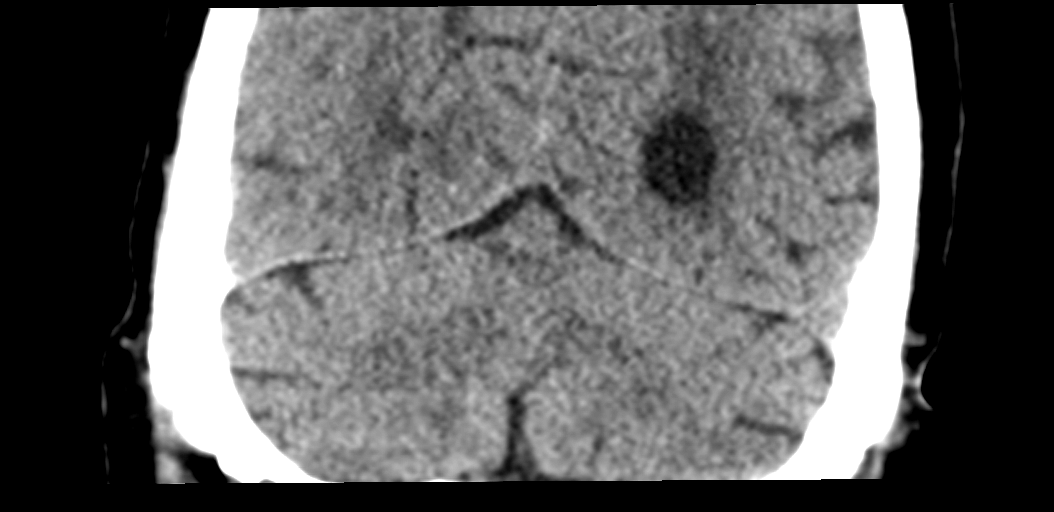
[im 32/64  brain]
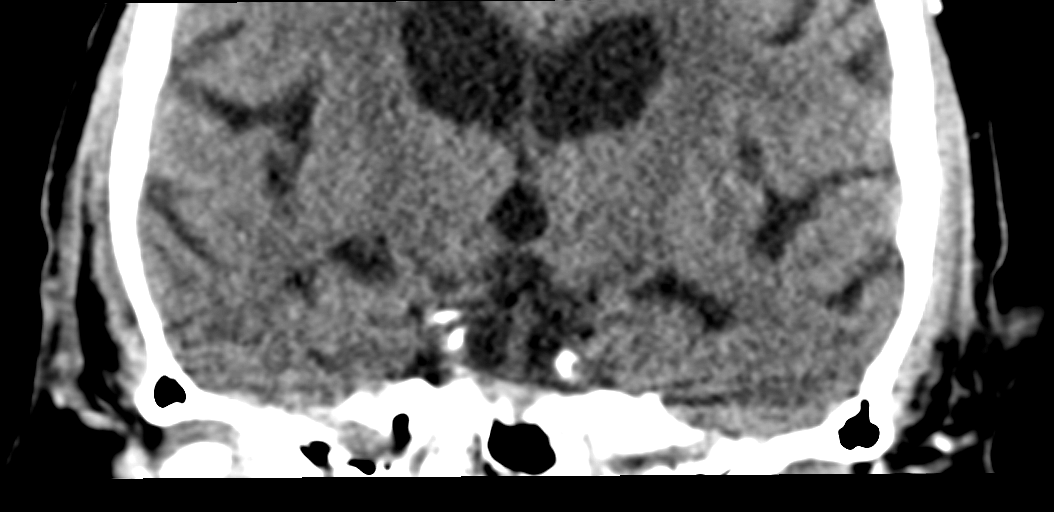
[im 48/64  brain]
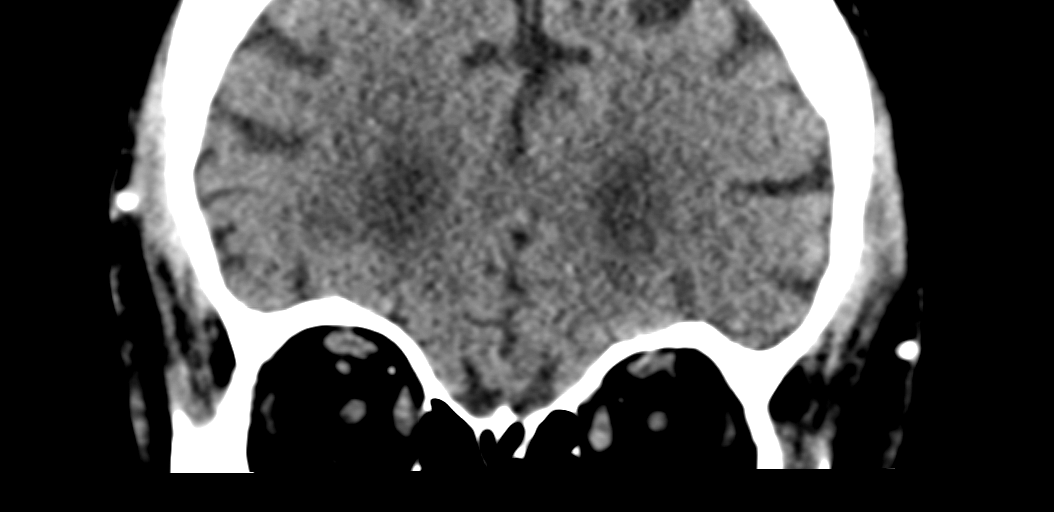

[Series 10: head 3.0 mpr sag · sagittal · 0.20mm/px · 2 of 52 slices shown]
[im 18/52  brain]
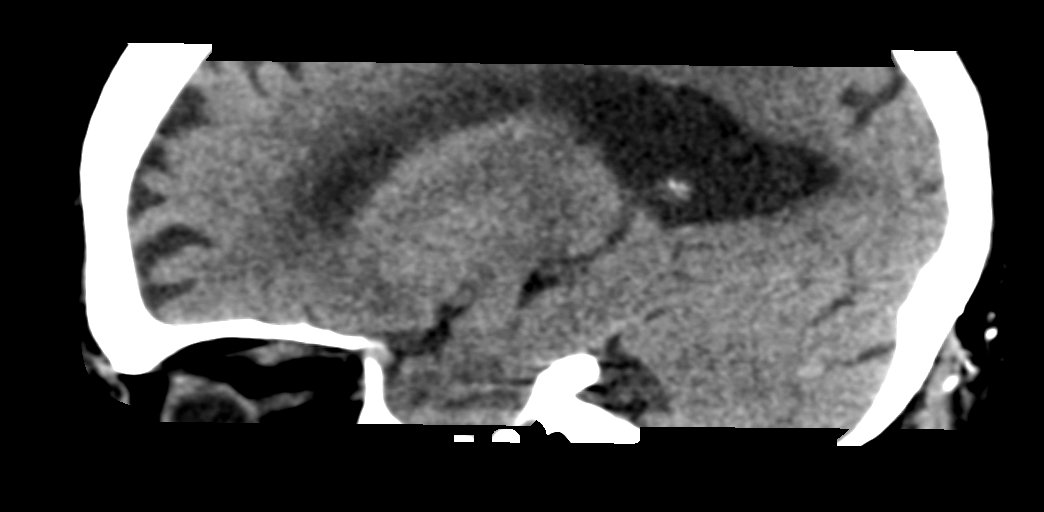
[im 35/52  brain]
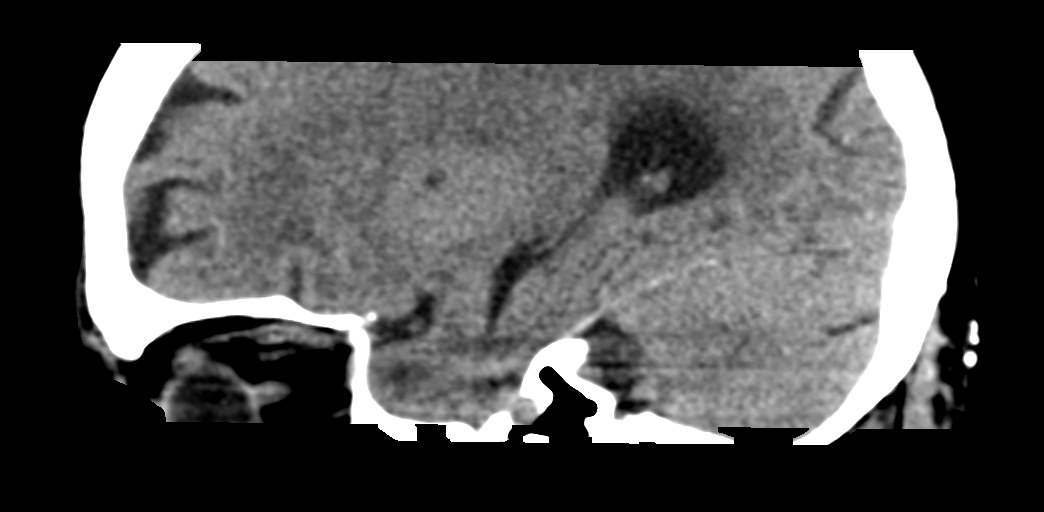

[13 of 47 positions shown; findings below may reference images not displayed]

FINDINGS: Brain: No evidence of an acute infarct, acute hemorrhage, mass
lesion, mass effect or hydrocephalus. Atrophy and periventricular
low attenuation.

Vascular: No hyperdense vessel or unexpected calcification.

Skull: Normal. Negative for fracture or focal lesion.

Sinuses/Orbits: No acute finding.

Other: None.
IMPRESSION: 1. No acute intracranial abnormality.
2. Atrophy and chronic microvascular white matter ischemic changes.

## 2019-09-20 IMAGING — DX DG CHEST 1V PORT
1 series · 1 of 1 positions shown · non-contrast
Comparison: 09/21/2015

CLINICAL DATA: Fever this evening

EXAM:
PORTABLE CHEST 1 VIEW

[chest ap]
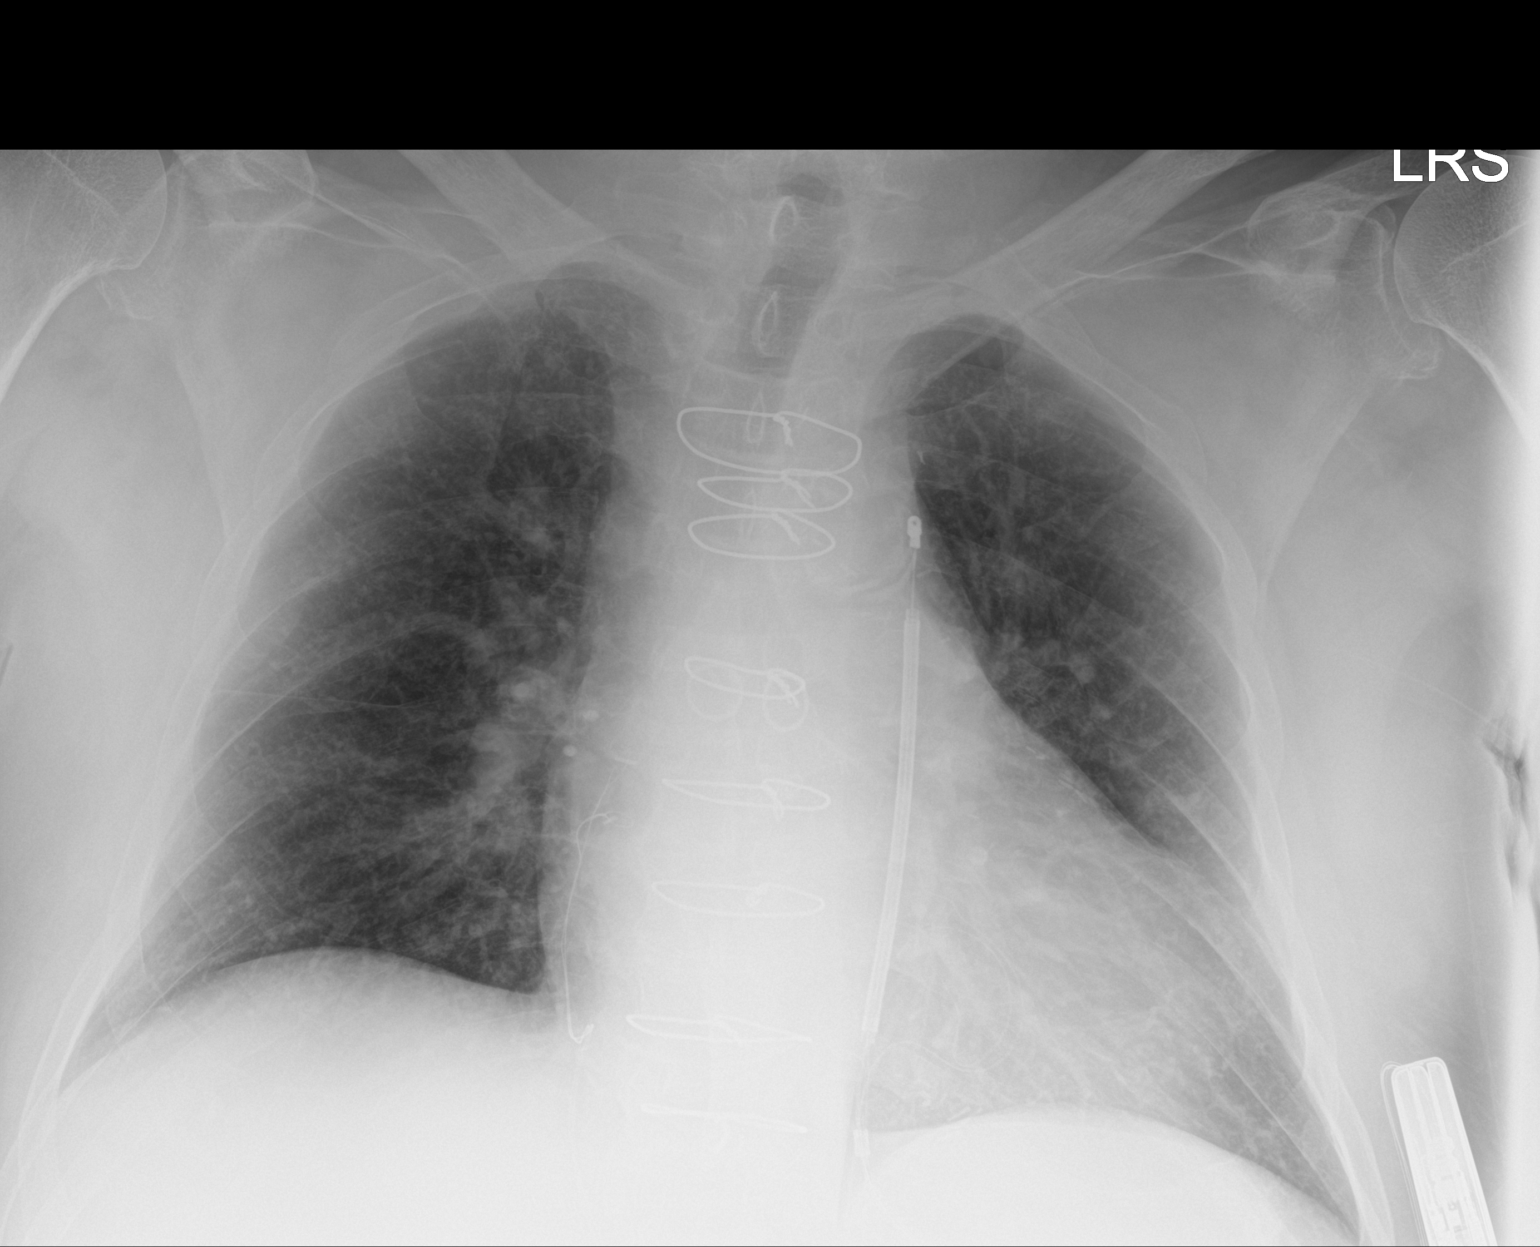

[1 of 1 positions shown; findings below may reference images not displayed]

FINDINGS: Status post CABG with borderline cardiomegaly and aortic
atherosclerosis. Epicardial pacing leads are again seen overlying
the right and left heart with new defibrillator like device
partially imaged along the periphery of the left hemithorax with
lead projecting up to the level of the aortic arch. Lungs are clear.
No acute nor suspicious osseous abnormalities.
IMPRESSION: Status post CABG with aortic atherosclerosis. No acute pneumonic
consolidation.

## 2019-09-29 IMAGING — CR DG CHEST 2V
2 series · 2 of 2 positions shown · non-contrast
Comparison: 01/03/2017 chest radiograph.

CLINICAL DATA: CHF

EXAM:
CHEST  2 VIEW

[chest lat]
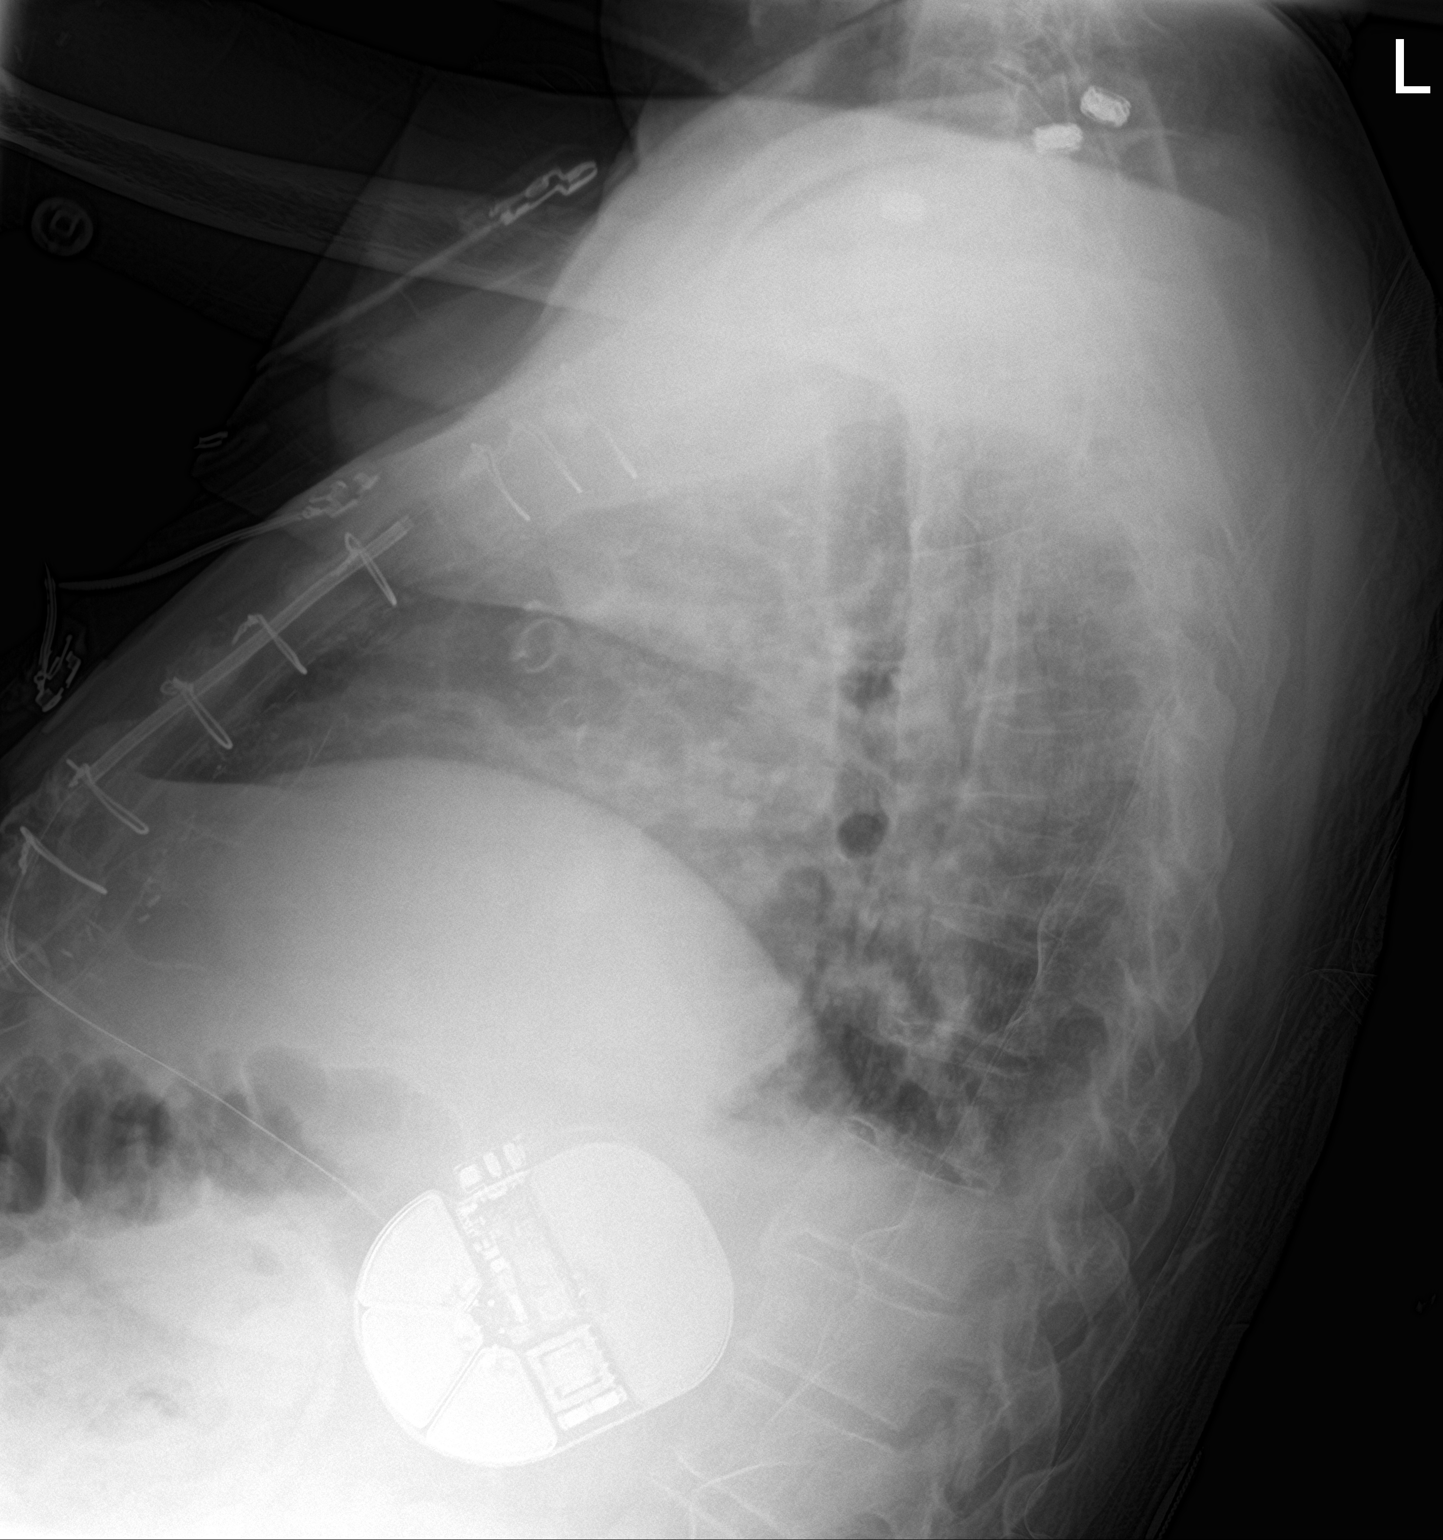

[chest ap]
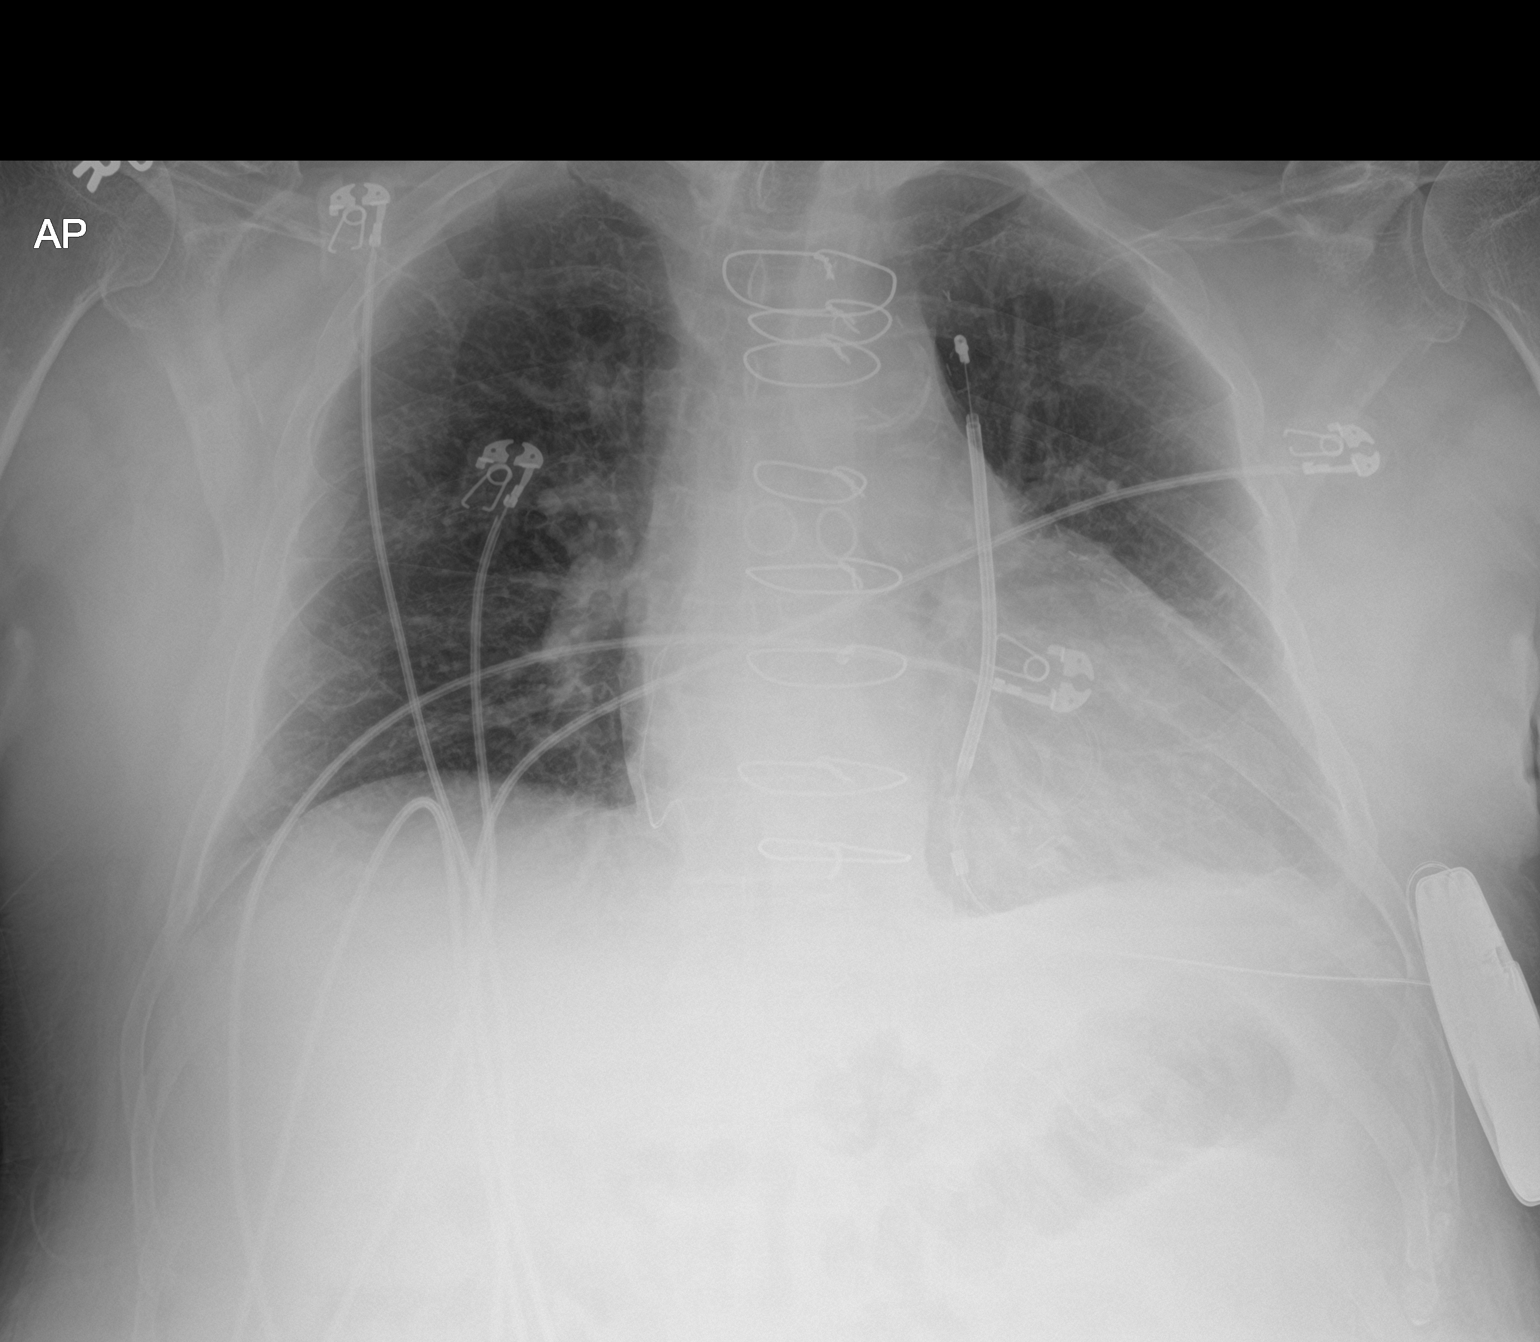

[2 of 2 positions shown; findings below may reference images not displayed]

FINDINGS: Intact sternotomy wires. Subcutaneous single lead left chest ICD is
stable with lead tip overlying the medial left upper chest. Stable
cardiomediastinal silhouette with mild cardiomegaly and aortic
atherosclerosis. No pneumothorax. Trace bilateral pleural effusions.
Cephalization of the pulmonary vasculature, with no overt pulmonary
edema. Mild left basilar atelectasis.
IMPRESSION: 1. Stable cardiomegaly without overt pulmonary edema.
2. Trace bilateral pleural effusions and mild left basilar
atelectasis.
